# Patient Record
Sex: Male | Born: 1945
Health system: Southern US, Community
[De-identification: ages and names within clinical notes are randomized; demographics above are authoritative.]

## PROBLEM LIST (undated history)

## (undated) DIAGNOSIS — E871 Hypo-osmolality and hyponatremia: Secondary | ICD-10-CM

## (undated) DIAGNOSIS — M25559 Pain in unspecified hip: Secondary | ICD-10-CM

## (undated) DIAGNOSIS — M25579 Pain in unspecified ankle and joints of unspecified foot: Secondary | ICD-10-CM

## (undated) DIAGNOSIS — F329 Major depressive disorder, single episode, unspecified: Secondary | ICD-10-CM

## (undated) DIAGNOSIS — R600 Localized edema: Secondary | ICD-10-CM

## (undated) DIAGNOSIS — R590 Localized enlarged lymph nodes: Secondary | ICD-10-CM

## (undated) DIAGNOSIS — Z87898 Personal history of other specified conditions: Secondary | ICD-10-CM

## (undated) DIAGNOSIS — K859 Acute pancreatitis without necrosis or infection, unspecified: Secondary | ICD-10-CM

## (undated) DIAGNOSIS — E1139 Type 2 diabetes mellitus with other diabetic ophthalmic complication: Secondary | ICD-10-CM

## (undated) DIAGNOSIS — I10 Essential (primary) hypertension: Secondary | ICD-10-CM

## (undated) DIAGNOSIS — J449 Chronic obstructive pulmonary disease, unspecified: Secondary | ICD-10-CM

## (undated) DIAGNOSIS — K219 Gastro-esophageal reflux disease without esophagitis: Secondary | ICD-10-CM

## (undated) DIAGNOSIS — G510 Bell's palsy: Secondary | ICD-10-CM

## (undated) DIAGNOSIS — Z8659 Personal history of other mental and behavioral disorders: Secondary | ICD-10-CM

## (undated) DIAGNOSIS — R197 Diarrhea, unspecified: Secondary | ICD-10-CM

## (undated) DIAGNOSIS — L570 Actinic keratosis: Secondary | ICD-10-CM

## (undated) DIAGNOSIS — E785 Hyperlipidemia, unspecified: Secondary | ICD-10-CM

## (undated) DIAGNOSIS — R634 Abnormal weight loss: Secondary | ICD-10-CM

## (undated) DIAGNOSIS — B20 Human immunodeficiency virus [HIV] disease: Secondary | ICD-10-CM

## (undated) DIAGNOSIS — Z87442 Personal history of urinary calculi: Secondary | ICD-10-CM

## (undated) DIAGNOSIS — L309 Dermatitis, unspecified: Secondary | ICD-10-CM

## (undated) DIAGNOSIS — I2510 Atherosclerotic heart disease of native coronary artery without angina pectoris: Secondary | ICD-10-CM

## (undated) DIAGNOSIS — R351 Nocturia: Secondary | ICD-10-CM

## (undated) DIAGNOSIS — IMO0002 Reserved for concepts with insufficient information to code with codable children: Secondary | ICD-10-CM

## (undated) DIAGNOSIS — Z9861 Coronary angioplasty status: Secondary | ICD-10-CM

## (undated) DIAGNOSIS — Z951 Presence of aortocoronary bypass graft: Secondary | ICD-10-CM

## (undated) DIAGNOSIS — I213 ST elevation (STEMI) myocardial infarction of unspecified site: Secondary | ICD-10-CM

## (undated) DIAGNOSIS — R599 Enlarged lymph nodes, unspecified: Secondary | ICD-10-CM

## (undated) DIAGNOSIS — R0989 Other specified symptoms and signs involving the circulatory and respiratory systems: Secondary | ICD-10-CM

## (undated) DIAGNOSIS — N649 Disorder of breast, unspecified: Secondary | ICD-10-CM

## (undated) DIAGNOSIS — E875 Hyperkalemia: Secondary | ICD-10-CM

## (undated) DIAGNOSIS — N183 Chronic kidney disease, stage 3 (moderate): Secondary | ICD-10-CM

## (undated) DIAGNOSIS — R413 Other amnesia: Secondary | ICD-10-CM

## (undated) DIAGNOSIS — D649 Anemia, unspecified: Secondary | ICD-10-CM

## (undated) DIAGNOSIS — R188 Other ascites: Secondary | ICD-10-CM

## (undated) DIAGNOSIS — N529 Male erectile dysfunction, unspecified: Secondary | ICD-10-CM

## (undated) DIAGNOSIS — A6000 Herpesviral infection of urogenital system, unspecified: Secondary | ICD-10-CM

## (undated) DIAGNOSIS — E881 Lipodystrophy, not elsewhere classified: Secondary | ICD-10-CM

## (undated) DIAGNOSIS — I350 Nonrheumatic aortic (valve) stenosis: Secondary | ICD-10-CM

## (undated) DIAGNOSIS — K5900 Constipation, unspecified: Secondary | ICD-10-CM

## (undated) DIAGNOSIS — I214 Non-ST elevation (NSTEMI) myocardial infarction: Secondary | ICD-10-CM

## (undated) DIAGNOSIS — M1990 Unspecified osteoarthritis, unspecified site: Secondary | ICD-10-CM

## (undated) DIAGNOSIS — N189 Chronic kidney disease, unspecified: Secondary | ICD-10-CM

## (undated) DIAGNOSIS — K921 Melena: Secondary | ICD-10-CM

## (undated) DIAGNOSIS — I200 Unstable angina: Secondary | ICD-10-CM

## (undated) DIAGNOSIS — H905 Unspecified sensorineural hearing loss: Secondary | ICD-10-CM

## (undated) DIAGNOSIS — M25569 Pain in unspecified knee: Secondary | ICD-10-CM

## (undated) DIAGNOSIS — M21969 Unspecified acquired deformity of unspecified lower leg: Secondary | ICD-10-CM

## (undated) DIAGNOSIS — H04129 Dry eye syndrome of unspecified lacrimal gland: Secondary | ICD-10-CM

## (undated) DIAGNOSIS — I739 Peripheral vascular disease, unspecified: Secondary | ICD-10-CM

## (undated) DIAGNOSIS — R61 Generalized hyperhidrosis: Secondary | ICD-10-CM

## (undated) DIAGNOSIS — R079 Chest pain, unspecified: Secondary | ICD-10-CM

## (undated) DIAGNOSIS — J340 Abscess, furuncle and carbuncle of nose: Secondary | ICD-10-CM

## (undated) DIAGNOSIS — E43 Unspecified severe protein-calorie malnutrition: Secondary | ICD-10-CM

## (undated) HISTORY — DX: Nocturia: R35.1

## (undated) HISTORY — DX: Abnormal weight loss: R63.4

## (undated) HISTORY — DX: Nonrheumatic aortic (valve) stenosis: I35.0

## (undated) HISTORY — DX: Unspecified acquired deformity of unspecified lower leg: M21.969

## (undated) HISTORY — DX: Generalized hyperhidrosis: R61

## (undated) HISTORY — DX: Non-ST elevation (NSTEMI) myocardial infarction: I21.4

## (undated) HISTORY — DX: Unspecified sensorineural hearing loss: H90.5

## (undated) HISTORY — DX: Dermatitis, unspecified: L30.9

## (undated) HISTORY — DX: Chronic kidney disease, unspecified: N18.9

## (undated) HISTORY — DX: Gastro-esophageal reflux disease without esophagitis: K21.9

## (undated) HISTORY — DX: Localized enlarged lymph nodes: R59.0

## (undated) HISTORY — DX: Atherosclerotic heart disease of native coronary artery without angina pectoris: I25.10

## (undated) HISTORY — DX: Unstable angina: I20.0

## (undated) HISTORY — DX: Hypo-osmolality and hyponatremia: E87.1

## (undated) HISTORY — DX: Actinic keratosis: L57.0

## (undated) HISTORY — DX: Disorder of breast, unspecified: N64.9

## (undated) HISTORY — DX: Dry eye syndrome of unspecified lacrimal gland: H04.129

## (undated) HISTORY — DX: ST elevation (STEMI) myocardial infarction of unspecified site: I21.3

## (undated) HISTORY — DX: Localized edema: R60.0

## (undated) HISTORY — PX: VASECTOMY: SHX75

## (undated) HISTORY — DX: Chronic obstructive pulmonary disease, unspecified: J44.9

## (undated) HISTORY — DX: Coronary angioplasty status: Z98.61

## (undated) HISTORY — DX: Other ascites: R18.8

## (undated) HISTORY — DX: Unspecified severe protein-calorie malnutrition: E43

## (undated) HISTORY — DX: Diarrhea, unspecified: R19.7

## (undated) HISTORY — DX: Pain in unspecified knee: M25.569

## (undated) HISTORY — DX: Personal history of other specified conditions: Z87.898

## (undated) HISTORY — DX: Hyperlipidemia, unspecified: E78.5

## (undated) HISTORY — DX: Chest pain, unspecified: R07.9

## (undated) HISTORY — DX: Lipodystrophy, not elsewhere classified: E88.1

## (undated) HISTORY — DX: Pain in unspecified hip: M25.559

## (undated) HISTORY — DX: Other amnesia: R41.3

## (undated) HISTORY — DX: Presence of aortocoronary bypass graft: Z95.1

## (undated) HISTORY — DX: Reserved for concepts with insufficient information to code with codable children: IMO0002

## (undated) HISTORY — DX: Bell's palsy: G51.0

## (undated) HISTORY — DX: Human immunodeficiency virus (HIV) disease: B20

## (undated) HISTORY — DX: Other specified symptoms and signs involving the circulatory and respiratory systems: R09.89

## (undated) HISTORY — DX: Personal history of other mental and behavioral disorders: Z86.59

## (undated) HISTORY — DX: Melena: K92.1

## (undated) HISTORY — DX: Personal history of urinary calculi: Z87.442

## (undated) HISTORY — DX: Abscess, furuncle and carbuncle of nose: J34.0

## (undated) HISTORY — DX: Unspecified osteoarthritis, unspecified site: M19.90

## (undated) HISTORY — DX: Type 2 diabetes mellitus with other diabetic ophthalmic complication: E11.39

## (undated) HISTORY — DX: Herpesviral infection of urogenital system, unspecified: A60.00

## (undated) HISTORY — DX: Pain in unspecified ankle and joints of unspecified foot: M25.579

## (undated) HISTORY — DX: Major depressive disorder, single episode, unspecified: F32.9

## (undated) HISTORY — DX: Constipation, unspecified: K59.00

## (undated) HISTORY — DX: Essential (primary) hypertension: I10

## (undated) HISTORY — DX: Enlarged lymph nodes, unspecified: R59.9

## (undated) HISTORY — DX: Peripheral vascular disease, unspecified: I73.9

## (undated) HISTORY — DX: Anemia, unspecified: D64.9

## (undated) HISTORY — DX: Hyperkalemia: E87.5

## (undated) HISTORY — DX: Chronic kidney disease, stage 3 (moderate): N18.3

## (undated) HISTORY — DX: Male erectile dysfunction, unspecified: N52.9

---

## 1989-08-04 DIAGNOSIS — Z21 Asymptomatic human immunodeficiency virus [HIV] infection status: Secondary | ICD-10-CM

## 1989-08-04 DIAGNOSIS — B20 Human immunodeficiency virus [HIV] disease: Secondary | ICD-10-CM

## 1989-08-04 HISTORY — DX: Human immunodeficiency virus (HIV) disease: B20

## 1989-08-04 HISTORY — DX: Asymptomatic human immunodeficiency virus (hiv) infection status: Z21

## 1991-07-11 ENCOUNTER — Encounter (INDEPENDENT_AMBULATORY_CARE_PROVIDER_SITE_OTHER): Payer: Self-pay | Admitting: *Deleted

## 1991-07-11 LAB — CONVERTED CEMR LAB
CD4 Count: 360 microliters
CD4 T Cell Abs: 360

## 1997-11-28 ENCOUNTER — Encounter: Admission: RE | Admit: 1997-11-28 | Discharge: 1997-11-28 | Payer: Self-pay | Admitting: Internal Medicine

## 1997-12-20 ENCOUNTER — Encounter: Admission: RE | Admit: 1997-12-20 | Discharge: 1997-12-20 | Payer: Self-pay | Admitting: Internal Medicine

## 1998-01-15 ENCOUNTER — Encounter: Admission: RE | Admit: 1998-01-15 | Discharge: 1998-01-15 | Payer: Self-pay | Admitting: Internal Medicine

## 1998-02-09 ENCOUNTER — Encounter: Admission: RE | Admit: 1998-02-09 | Discharge: 1998-02-09 | Payer: Self-pay | Admitting: Internal Medicine

## 1998-03-06 ENCOUNTER — Encounter: Admission: RE | Admit: 1998-03-06 | Discharge: 1998-03-06 | Payer: Self-pay | Admitting: Infectious Diseases

## 1998-03-27 ENCOUNTER — Encounter: Admission: RE | Admit: 1998-03-27 | Discharge: 1998-03-27 | Payer: Self-pay | Admitting: Obstetrics and Gynecology

## 1998-05-01 ENCOUNTER — Encounter: Admission: RE | Admit: 1998-05-01 | Discharge: 1998-05-01 | Payer: Self-pay | Admitting: Internal Medicine

## 1998-05-30 ENCOUNTER — Ambulatory Visit (HOSPITAL_COMMUNITY): Admission: RE | Admit: 1998-05-30 | Discharge: 1998-05-30 | Payer: Self-pay | Admitting: Infectious Diseases

## 1998-06-25 ENCOUNTER — Encounter: Admission: RE | Admit: 1998-06-25 | Discharge: 1998-06-25 | Payer: Self-pay | Admitting: Infectious Diseases

## 1998-07-03 ENCOUNTER — Encounter: Admission: RE | Admit: 1998-07-03 | Discharge: 1998-07-03 | Payer: Self-pay | Admitting: Internal Medicine

## 1998-08-22 ENCOUNTER — Encounter: Admission: RE | Admit: 1998-08-22 | Discharge: 1998-08-22 | Payer: Self-pay | Admitting: Internal Medicine

## 1998-09-18 ENCOUNTER — Encounter: Admission: RE | Admit: 1998-09-18 | Discharge: 1998-09-18 | Payer: Self-pay | Admitting: Internal Medicine

## 1998-10-08 ENCOUNTER — Encounter: Admission: RE | Admit: 1998-10-08 | Discharge: 1998-10-08 | Payer: Self-pay | Admitting: Infectious Diseases

## 1998-12-17 ENCOUNTER — Encounter: Admission: RE | Admit: 1998-12-17 | Discharge: 1998-12-17 | Payer: Self-pay | Admitting: Internal Medicine

## 1999-02-19 ENCOUNTER — Encounter: Admission: RE | Admit: 1999-02-19 | Discharge: 1999-02-19 | Payer: Self-pay | Admitting: Infectious Diseases

## 1999-03-25 ENCOUNTER — Encounter: Admission: RE | Admit: 1999-03-25 | Discharge: 1999-03-25 | Payer: Self-pay | Admitting: Internal Medicine

## 1999-06-10 ENCOUNTER — Encounter: Admission: RE | Admit: 1999-06-10 | Discharge: 1999-06-10 | Payer: Self-pay | Admitting: Internal Medicine

## 1999-09-11 ENCOUNTER — Encounter (INDEPENDENT_AMBULATORY_CARE_PROVIDER_SITE_OTHER): Payer: Self-pay | Admitting: *Deleted

## 1999-09-11 ENCOUNTER — Emergency Department (HOSPITAL_COMMUNITY): Admission: EM | Admit: 1999-09-11 | Discharge: 1999-09-11 | Payer: Self-pay | Admitting: Emergency Medicine

## 1999-09-23 ENCOUNTER — Encounter: Admission: RE | Admit: 1999-09-23 | Discharge: 1999-09-23 | Payer: Self-pay | Admitting: Internal Medicine

## 1999-10-08 ENCOUNTER — Encounter: Admission: RE | Admit: 1999-10-08 | Discharge: 1999-10-08 | Payer: Self-pay | Admitting: Internal Medicine

## 1999-10-21 ENCOUNTER — Encounter: Admission: RE | Admit: 1999-10-21 | Discharge: 1999-10-21 | Payer: Self-pay | Admitting: Internal Medicine

## 2000-02-10 ENCOUNTER — Encounter: Admission: RE | Admit: 2000-02-10 | Discharge: 2000-02-10 | Payer: Self-pay | Admitting: Internal Medicine

## 2000-03-03 ENCOUNTER — Encounter: Admission: RE | Admit: 2000-03-03 | Discharge: 2000-03-03 | Payer: Self-pay | Admitting: Internal Medicine

## 2000-06-08 ENCOUNTER — Encounter: Admission: RE | Admit: 2000-06-08 | Discharge: 2000-06-08 | Payer: Self-pay | Admitting: Internal Medicine

## 2000-09-28 ENCOUNTER — Encounter: Admission: RE | Admit: 2000-09-28 | Discharge: 2000-09-28 | Payer: Self-pay | Admitting: Internal Medicine

## 2000-10-20 ENCOUNTER — Encounter: Admission: RE | Admit: 2000-10-20 | Discharge: 2000-10-20 | Payer: Self-pay | Admitting: Internal Medicine

## 2001-01-11 ENCOUNTER — Encounter: Admission: RE | Admit: 2001-01-11 | Discharge: 2001-01-11 | Payer: Self-pay | Admitting: Infectious Diseases

## 2001-03-02 ENCOUNTER — Encounter: Admission: RE | Admit: 2001-03-02 | Discharge: 2001-03-02 | Payer: Self-pay | Admitting: Internal Medicine

## 2001-05-03 ENCOUNTER — Encounter: Admission: RE | Admit: 2001-05-03 | Discharge: 2001-05-03 | Payer: Self-pay | Admitting: Internal Medicine

## 2001-05-24 ENCOUNTER — Encounter: Admission: RE | Admit: 2001-05-24 | Discharge: 2001-05-24 | Payer: Self-pay | Admitting: Internal Medicine

## 2001-08-30 ENCOUNTER — Encounter: Admission: RE | Admit: 2001-08-30 | Discharge: 2001-08-30 | Payer: Self-pay | Admitting: Internal Medicine

## 2001-12-20 ENCOUNTER — Encounter: Admission: RE | Admit: 2001-12-20 | Discharge: 2001-12-20 | Payer: Self-pay | Admitting: Internal Medicine

## 2001-12-31 ENCOUNTER — Encounter: Admission: RE | Admit: 2001-12-31 | Discharge: 2001-12-31 | Payer: Self-pay | Admitting: Otolaryngology

## 2001-12-31 ENCOUNTER — Encounter: Payer: Self-pay | Admitting: Otolaryngology

## 2002-04-11 ENCOUNTER — Encounter: Admission: RE | Admit: 2002-04-11 | Discharge: 2002-04-11 | Payer: Self-pay | Admitting: Internal Medicine

## 2002-05-04 HISTORY — PX: CORONARY ARTERY BYPASS GRAFT: SHX141

## 2002-05-26 ENCOUNTER — Encounter: Payer: Self-pay | Admitting: Cardiology

## 2002-05-27 ENCOUNTER — Encounter: Payer: Self-pay | Admitting: Thoracic Surgery (Cardiothoracic Vascular Surgery)

## 2002-05-27 ENCOUNTER — Inpatient Hospital Stay (HOSPITAL_COMMUNITY): Admission: AD | Admit: 2002-05-27 | Discharge: 2002-05-31 | Payer: Self-pay | Admitting: Cardiology

## 2002-05-28 ENCOUNTER — Encounter: Payer: Self-pay | Admitting: Thoracic Surgery (Cardiothoracic Vascular Surgery)

## 2002-05-29 ENCOUNTER — Encounter: Payer: Self-pay | Admitting: Cardiothoracic Surgery

## 2002-05-30 ENCOUNTER — Encounter: Payer: Self-pay | Admitting: Cardiothoracic Surgery

## 2002-05-31 ENCOUNTER — Encounter: Payer: Self-pay | Admitting: Thoracic Surgery (Cardiothoracic Vascular Surgery)

## 2002-06-16 ENCOUNTER — Encounter: Admission: RE | Admit: 2002-06-16 | Discharge: 2002-06-16 | Payer: Self-pay | Admitting: Internal Medicine

## 2002-08-01 ENCOUNTER — Encounter (HOSPITAL_COMMUNITY): Admission: RE | Admit: 2002-08-01 | Discharge: 2002-10-30 | Payer: Self-pay | Admitting: Cardiology

## 2002-08-01 ENCOUNTER — Encounter: Admission: RE | Admit: 2002-08-01 | Discharge: 2002-08-01 | Payer: Self-pay | Admitting: Internal Medicine

## 2002-09-02 ENCOUNTER — Encounter: Admission: RE | Admit: 2002-09-02 | Discharge: 2002-09-02 | Payer: Self-pay | Admitting: Internal Medicine

## 2002-11-21 ENCOUNTER — Encounter: Admission: RE | Admit: 2002-11-21 | Discharge: 2002-11-21 | Payer: Self-pay | Admitting: Internal Medicine

## 2003-03-13 ENCOUNTER — Encounter: Admission: RE | Admit: 2003-03-13 | Discharge: 2003-03-13 | Payer: Self-pay | Admitting: Internal Medicine

## 2003-03-28 ENCOUNTER — Encounter: Admission: RE | Admit: 2003-03-28 | Discharge: 2003-03-28 | Payer: Self-pay | Admitting: Internal Medicine

## 2003-03-29 ENCOUNTER — Encounter: Admission: RE | Admit: 2003-03-29 | Discharge: 2003-03-29 | Payer: Self-pay | Admitting: Internal Medicine

## 2003-05-12 ENCOUNTER — Encounter: Admission: RE | Admit: 2003-05-12 | Discharge: 2003-05-12 | Payer: Self-pay | Admitting: Internal Medicine

## 2003-06-06 ENCOUNTER — Encounter: Admission: RE | Admit: 2003-06-06 | Discharge: 2003-06-06 | Payer: Self-pay | Admitting: Internal Medicine

## 2003-07-03 ENCOUNTER — Encounter: Admission: RE | Admit: 2003-07-03 | Discharge: 2003-07-03 | Payer: Self-pay | Admitting: Internal Medicine

## 2003-08-17 ENCOUNTER — Encounter: Admission: RE | Admit: 2003-08-17 | Discharge: 2003-08-17 | Payer: Self-pay | Admitting: Internal Medicine

## 2003-10-24 ENCOUNTER — Encounter: Admission: RE | Admit: 2003-10-24 | Discharge: 2003-10-24 | Payer: Self-pay | Admitting: Internal Medicine

## 2004-01-04 ENCOUNTER — Encounter: Admission: RE | Admit: 2004-01-04 | Discharge: 2004-01-04 | Payer: Self-pay | Admitting: Infectious Diseases

## 2004-02-12 ENCOUNTER — Encounter: Admission: RE | Admit: 2004-02-12 | Discharge: 2004-02-12 | Payer: Self-pay | Admitting: Internal Medicine

## 2005-02-20 ENCOUNTER — Ambulatory Visit (HOSPITAL_COMMUNITY): Admission: RE | Admit: 2005-02-20 | Discharge: 2005-02-20 | Payer: Self-pay | Admitting: Internal Medicine

## 2005-02-20 ENCOUNTER — Ambulatory Visit: Payer: Self-pay | Admitting: Internal Medicine

## 2005-03-10 ENCOUNTER — Ambulatory Visit: Payer: Self-pay | Admitting: Internal Medicine

## 2005-06-03 ENCOUNTER — Ambulatory Visit: Payer: Self-pay | Admitting: Internal Medicine

## 2005-09-09 ENCOUNTER — Ambulatory Visit: Payer: Self-pay | Admitting: Internal Medicine

## 2005-09-23 ENCOUNTER — Ambulatory Visit: Payer: Self-pay | Admitting: Internal Medicine

## 2005-11-24 ENCOUNTER — Emergency Department (HOSPITAL_COMMUNITY): Admission: EM | Admit: 2005-11-24 | Discharge: 2005-11-24 | Payer: Self-pay | Admitting: Emergency Medicine

## 2005-12-08 ENCOUNTER — Encounter: Admission: RE | Admit: 2005-12-08 | Discharge: 2005-12-08 | Payer: Self-pay | Admitting: Internal Medicine

## 2005-12-08 ENCOUNTER — Ambulatory Visit: Payer: Self-pay | Admitting: Internal Medicine

## 2005-12-23 ENCOUNTER — Ambulatory Visit: Payer: Self-pay | Admitting: Internal Medicine

## 2006-01-08 ENCOUNTER — Emergency Department (HOSPITAL_COMMUNITY): Admission: EM | Admit: 2006-01-08 | Discharge: 2006-01-08 | Payer: Self-pay | Admitting: Family Medicine

## 2006-02-17 ENCOUNTER — Ambulatory Visit: Payer: Self-pay | Admitting: Internal Medicine

## 2006-02-17 ENCOUNTER — Encounter (INDEPENDENT_AMBULATORY_CARE_PROVIDER_SITE_OTHER): Payer: Self-pay | Admitting: *Deleted

## 2006-02-17 LAB — CONVERTED CEMR LAB
CD4 Count: 599 microliters
HIV 1 RNA Quant: 49 copies/mL

## 2006-03-09 ENCOUNTER — Encounter: Admission: RE | Admit: 2006-03-09 | Discharge: 2006-03-09 | Payer: Self-pay | Admitting: Internal Medicine

## 2006-03-09 ENCOUNTER — Ambulatory Visit: Payer: Self-pay | Admitting: Internal Medicine

## 2006-03-09 ENCOUNTER — Encounter (INDEPENDENT_AMBULATORY_CARE_PROVIDER_SITE_OTHER): Payer: Self-pay | Admitting: *Deleted

## 2006-03-09 LAB — CONVERTED CEMR LAB
CD4 Count: 540 microliters
HIV 1 RNA Quant: 107 copies/mL

## 2006-03-24 ENCOUNTER — Ambulatory Visit: Payer: Self-pay | Admitting: Internal Medicine

## 2006-05-12 ENCOUNTER — Ambulatory Visit: Payer: Self-pay | Admitting: Internal Medicine

## 2006-05-12 DIAGNOSIS — I10 Essential (primary) hypertension: Secondary | ICD-10-CM | POA: Insufficient documentation

## 2006-05-12 DIAGNOSIS — Z9861 Coronary angioplasty status: Secondary | ICD-10-CM | POA: Insufficient documentation

## 2006-05-12 DIAGNOSIS — I2510 Atherosclerotic heart disease of native coronary artery without angina pectoris: Secondary | ICD-10-CM

## 2006-05-12 DIAGNOSIS — E785 Hyperlipidemia, unspecified: Secondary | ICD-10-CM | POA: Insufficient documentation

## 2006-05-12 DIAGNOSIS — H905 Unspecified sensorineural hearing loss: Secondary | ICD-10-CM

## 2006-05-12 DIAGNOSIS — B20 Human immunodeficiency virus [HIV] disease: Secondary | ICD-10-CM | POA: Insufficient documentation

## 2006-05-12 HISTORY — DX: Atherosclerotic heart disease of native coronary artery without angina pectoris: I25.10

## 2006-05-12 HISTORY — DX: Human immunodeficiency virus (HIV) disease: B20

## 2006-05-12 HISTORY — DX: Essential (primary) hypertension: I10

## 2006-05-12 HISTORY — DX: Atherosclerotic heart disease of native coronary artery without angina pectoris: Z98.61

## 2006-05-12 HISTORY — DX: Unspecified sensorineural hearing loss: H90.5

## 2006-06-12 ENCOUNTER — Ambulatory Visit: Payer: Self-pay | Admitting: Internal Medicine

## 2006-06-12 LAB — CONVERTED CEMR LAB
ALT: 25 units/L (ref 0–53)
Basophils Absolute: 0 10*3/uL (ref 0.0–0.1)
CO2: 25 meq/L (ref 19–32)
Calcium: 8.7 mg/dL (ref 8.4–10.5)
Chloride: 106 meq/L (ref 96–112)
Cholesterol: 127 mg/dL (ref 0–200)
Creatinine, Ser: 0.85 mg/dL (ref 0.40–1.50)
Glucose, Bld: 103 mg/dL — ABNORMAL HIGH (ref 70–99)
Hemoglobin: 14 g/dL (ref 13.9–16.8)
Leukocyte count, blood: 4.9 10*9/L (ref 3.7–10.0)
Lymphocytes Relative: 27 % (ref 15–43)
Lymphs Abs: 1.3 10*3/uL (ref 0.8–3.1)
Monocytes Absolute: 0.5 10*3/uL (ref 0.2–0.7)
Neutro Abs: 2.8 10*3/uL (ref 1.8–6.8)
RBC: 3.32 M/uL — ABNORMAL LOW (ref 4.20–5.50)
RDW: 14.7 % (ref 11.5–15.3)
Total CHOL/HDL Ratio: 4.1
Total Protein: 6.7 g/dL (ref 6.0–8.3)

## 2006-07-23 ENCOUNTER — Ambulatory Visit: Payer: Self-pay | Admitting: Internal Medicine

## 2006-07-23 ENCOUNTER — Encounter: Admission: RE | Admit: 2006-07-23 | Discharge: 2006-07-23 | Payer: Self-pay | Admitting: Internal Medicine

## 2006-07-23 ENCOUNTER — Encounter (INDEPENDENT_AMBULATORY_CARE_PROVIDER_SITE_OTHER): Payer: Self-pay | Admitting: *Deleted

## 2006-07-23 LAB — CONVERTED CEMR LAB
ALT: 33 units/L (ref 0–53)
AST: 24 units/L (ref 0–37)
Basophils Absolute: 0 10*3/uL (ref 0.0–0.1)
Basophils Relative: 0 % (ref 0–1)
Creatinine, Ser: 0.8 mg/dL (ref 0.40–1.50)
HIV 1 RNA Quant: <50 copies/mL (ref <50)
Hemoglobin: 14.4 g/dL (ref 13.9–16.8)
MCHC: 34.9 g/dL (ref 33.1–35.4)
MCV: 120.1 fL — ABNORMAL HIGH (ref 78.8–100.0)
Monocytes Absolute: 0.5 10*3/uL (ref 0.2–0.7)
Monocytes Relative: 11 % (ref 3–11)
RBC: 3.44 M/uL — ABNORMAL LOW (ref 4.20–5.50)
Total Bilirubin: 0.5 mg/dL (ref 0.3–1.2)

## 2006-09-03 DIAGNOSIS — F339 Major depressive disorder, recurrent, unspecified: Secondary | ICD-10-CM

## 2006-09-03 DIAGNOSIS — E1139 Type 2 diabetes mellitus with other diabetic ophthalmic complication: Secondary | ICD-10-CM

## 2006-09-03 DIAGNOSIS — F3289 Other specified depressive episodes: Secondary | ICD-10-CM

## 2006-09-03 DIAGNOSIS — F329 Major depressive disorder, single episode, unspecified: Secondary | ICD-10-CM

## 2006-09-03 DIAGNOSIS — K219 Gastro-esophageal reflux disease without esophagitis: Secondary | ICD-10-CM

## 2006-09-03 DIAGNOSIS — N1832 Chronic kidney disease, stage 3b: Secondary | ICD-10-CM | POA: Insufficient documentation

## 2006-09-03 DIAGNOSIS — Z951 Presence of aortocoronary bypass graft: Secondary | ICD-10-CM

## 2006-09-03 HISTORY — DX: Presence of aortocoronary bypass graft: Z95.1

## 2006-09-03 HISTORY — DX: Other specified depressive episodes: F32.89

## 2006-09-03 HISTORY — DX: Major depressive disorder, single episode, unspecified: F32.9

## 2006-09-03 HISTORY — DX: Type 2 diabetes mellitus with other diabetic ophthalmic complication: E11.39

## 2006-09-03 HISTORY — DX: Gastro-esophageal reflux disease without esophagitis: K21.9

## 2006-09-21 ENCOUNTER — Ambulatory Visit: Payer: Self-pay | Admitting: Internal Medicine

## 2006-09-28 ENCOUNTER — Encounter (INDEPENDENT_AMBULATORY_CARE_PROVIDER_SITE_OTHER): Payer: Self-pay | Admitting: *Deleted

## 2006-09-28 LAB — CONVERTED CEMR LAB

## 2006-09-29 ENCOUNTER — Ambulatory Visit: Payer: Self-pay | Admitting: Internal Medicine

## 2006-09-29 LAB — CONVERTED CEMR LAB: CD4 Count: 530 microliters

## 2006-09-30 ENCOUNTER — Encounter: Payer: Self-pay | Admitting: Internal Medicine

## 2006-10-11 ENCOUNTER — Encounter (INDEPENDENT_AMBULATORY_CARE_PROVIDER_SITE_OTHER): Payer: Self-pay | Admitting: *Deleted

## 2006-11-17 ENCOUNTER — Encounter: Payer: Self-pay | Admitting: Internal Medicine

## 2007-01-05 ENCOUNTER — Ambulatory Visit: Payer: Self-pay | Admitting: Internal Medicine

## 2007-02-15 ENCOUNTER — Ambulatory Visit: Payer: Self-pay | Admitting: Internal Medicine

## 2007-02-15 ENCOUNTER — Encounter: Admission: RE | Admit: 2007-02-15 | Discharge: 2007-02-15 | Payer: Self-pay | Admitting: Internal Medicine

## 2007-02-15 LAB — CONVERTED CEMR LAB
ALT: 30 units/L (ref 0–53)
Albumin: 4.5 g/dL (ref 3.5–5.2)
Alkaline Phosphatase: 71 units/L (ref 39–117)
CO2: 21 meq/L (ref 19–32)
Glucose, Bld: 134 mg/dL — ABNORMAL HIGH (ref 70–99)
HIV 1 RNA Quant: 77 copies/mL — ABNORMAL HIGH (ref <50)
HIV-1 RNA Quant, Log: 1.89 — ABNORMAL HIGH (ref <1.70)
LDL Cholesterol: 17 mg/dL (ref 0–99)
Platelets: 169 10*3/uL (ref 150–400)
Potassium: 3.7 meq/L (ref 3.5–5.3)
RBC: 3.22 M/uL — ABNORMAL LOW (ref 4.22–5.81)
Sodium: 141 meq/L (ref 135–145)
Total Protein: 6.8 g/dL (ref 6.0–8.3)
Triglycerides: 391 mg/dL — ABNORMAL HIGH (ref <150)
VLDL: 78 mg/dL — ABNORMAL HIGH (ref 0–40)
WBC: 4.2 10*3/uL (ref 4.0–10.5)

## 2007-02-16 ENCOUNTER — Encounter: Payer: Self-pay | Admitting: Internal Medicine

## 2007-02-16 LAB — CONVERTED CEMR LAB: CD4 Count: 550 microliters

## 2007-03-02 ENCOUNTER — Ambulatory Visit: Payer: Self-pay | Admitting: Internal Medicine

## 2007-03-09 ENCOUNTER — Telehealth: Payer: Self-pay | Admitting: Internal Medicine

## 2007-03-15 ENCOUNTER — Ambulatory Visit: Payer: Self-pay | Admitting: Gastroenterology

## 2007-03-19 ENCOUNTER — Encounter (INDEPENDENT_AMBULATORY_CARE_PROVIDER_SITE_OTHER): Payer: Self-pay | Admitting: *Deleted

## 2007-03-29 ENCOUNTER — Ambulatory Visit: Payer: Self-pay | Admitting: Gastroenterology

## 2007-04-06 ENCOUNTER — Telehealth: Payer: Self-pay | Admitting: Internal Medicine

## 2007-04-13 ENCOUNTER — Ambulatory Visit: Payer: Self-pay | Admitting: Internal Medicine

## 2007-04-13 LAB — CONVERTED CEMR LAB
BUN: 14 mg/dL (ref 6–23)
Calcium: 8.9 mg/dL (ref 8.4–10.5)
Chloride: 105 meq/L (ref 96–112)
Cholesterol: 125 mg/dL (ref 0–200)
Creatinine, Ser: 0.83 mg/dL (ref 0.40–1.50)
HDL: 27 mg/dL — ABNORMAL LOW (ref >39)
Triglycerides: 419 mg/dL — ABNORMAL HIGH (ref <150)

## 2007-05-04 ENCOUNTER — Ambulatory Visit: Payer: Self-pay | Admitting: Internal Medicine

## 2007-05-04 LAB — CONVERTED CEMR LAB
ALT: 29 units/L (ref 0–53)
Alkaline Phosphatase: 76 units/L (ref 39–117)
Basophils Absolute: 0 10*3/uL (ref 0.0–0.1)
Basophils Relative: 1 % (ref 0–1)
CO2: 22 meq/L (ref 19–32)
Cholesterol: 140 mg/dL (ref 0–200)
Creatinine, Ser: 0.77 mg/dL (ref 0.40–1.50)
Eosinophils Absolute: 0.2 10*3/uL (ref 0.0–0.7)
Eosinophils Relative: 5 % (ref 0–5)
HCT: 39.8 % (ref 39.0–52.0)
Hemoglobin: 13.8 g/dL (ref 13.0–17.0)
Lymphocytes Relative: 28 % (ref 12–46)
MCHC: 34.7 g/dL (ref 30.0–36.0)
MCV: 121.7 fL — ABNORMAL HIGH (ref 78.0–100.0)
Monocytes Absolute: 0.4 10*3/uL (ref 0.2–0.7)
Platelets: 165 10*3/uL (ref 150–400)
RDW: 15.1 % — ABNORMAL HIGH (ref 11.5–14.0)
Sodium: 141 meq/L (ref 135–145)
Total Bilirubin: 0.5 mg/dL (ref 0.3–1.2)
Total CHOL/HDL Ratio: 4.8
Total Protein: 6.8 g/dL (ref 6.0–8.3)

## 2007-06-01 ENCOUNTER — Ambulatory Visit: Payer: Self-pay | Admitting: Internal Medicine

## 2007-08-02 ENCOUNTER — Encounter (INDEPENDENT_AMBULATORY_CARE_PROVIDER_SITE_OTHER): Payer: Self-pay | Admitting: *Deleted

## 2007-08-03 ENCOUNTER — Encounter: Payer: Self-pay | Admitting: Internal Medicine

## 2007-08-03 ENCOUNTER — Encounter (INDEPENDENT_AMBULATORY_CARE_PROVIDER_SITE_OTHER): Payer: Self-pay | Admitting: *Deleted

## 2007-08-10 ENCOUNTER — Ambulatory Visit: Payer: Self-pay | Admitting: Internal Medicine

## 2007-08-10 ENCOUNTER — Encounter: Admission: RE | Admit: 2007-08-10 | Discharge: 2007-08-10 | Payer: Self-pay | Admitting: Internal Medicine

## 2007-08-10 LAB — CONVERTED CEMR LAB
ALT: 20 U/L
AST: 21 U/L
Albumin: 4.7 g/dL
Alkaline Phosphatase: 66 U/L
BUN: 22 mg/dL
CO2: 21 meq/L
Calcium: 9.4 mg/dL
Chloride: 105 meq/L
Cholesterol: 135 mg/dL
Creatinine, Ser: 0.87 mg/dL
Glucose, Bld: 154 mg/dL — ABNORMAL HIGH
HCT: 37 % — ABNORMAL LOW
HDL: 27 mg/dL — ABNORMAL LOW
HIV 1 RNA Quant: <50 {copies}/mL
HIV-1 RNA Quant, Log: <1.7
Hemoglobin: 12.9 g/dL — ABNORMAL LOW
LDL Cholesterol: 37 mg/dL
MCHC: 34.9 g/dL
MCV: 120.1 fL — ABNORMAL HIGH
Platelets: 166 K/uL
Potassium: 4.5 meq/L
RBC: 3.08 M/uL — ABNORMAL LOW
RDW: 14.5 %
Sodium: 140 meq/L
Total Bilirubin: 0.6 mg/dL
Total CHOL/HDL Ratio: 5
Total Protein: 7.1 g/dL
Triglycerides: 355 mg/dL — ABNORMAL HIGH
VLDL: 71 mg/dL — ABNORMAL HIGH
WBC: 4.7 10*3/microliter

## 2007-08-17 ENCOUNTER — Ambulatory Visit: Payer: Self-pay | Admitting: Internal Medicine

## 2007-08-25 ENCOUNTER — Ambulatory Visit: Payer: Self-pay | Admitting: Internal Medicine

## 2007-10-22 ENCOUNTER — Telehealth: Payer: Self-pay | Admitting: Internal Medicine

## 2007-10-25 ENCOUNTER — Emergency Department (HOSPITAL_COMMUNITY): Admission: EM | Admit: 2007-10-25 | Discharge: 2007-10-25 | Payer: Self-pay | Admitting: Emergency Medicine

## 2007-11-16 ENCOUNTER — Encounter (INDEPENDENT_AMBULATORY_CARE_PROVIDER_SITE_OTHER): Payer: Self-pay | Admitting: *Deleted

## 2007-12-02 ENCOUNTER — Ambulatory Visit: Payer: Self-pay | Admitting: Internal Medicine

## 2007-12-02 ENCOUNTER — Encounter: Admission: RE | Admit: 2007-12-02 | Discharge: 2007-12-02 | Payer: Self-pay | Admitting: Internal Medicine

## 2007-12-02 LAB — CONVERTED CEMR LAB
AST: 30 units/L (ref 0–37)
Albumin: 4.6 g/dL (ref 3.5–5.2)
Alkaline Phosphatase: 74 units/L (ref 39–117)
BUN: 13 mg/dL (ref 6–23)
Basophils Relative: 0 % (ref 0–1)
Creatinine, Ser: 0.8 mg/dL (ref 0.40–1.50)
Eosinophils Absolute: 0.1 10*3/uL (ref 0.0–0.7)
HDL: 29 mg/dL — ABNORMAL LOW (ref >39)
HIV 1 RNA Quant: <50 copies/mL (ref <50)
LDL Cholesterol: 32 mg/dL (ref 0–99)
MCHC: 34.1 g/dL (ref 30.0–36.0)
MCV: 120 fL — ABNORMAL HIGH (ref 78.0–100.0)
Monocytes Relative: 10 % (ref 3–12)
Neutrophils Relative %: 61 % (ref 43–77)
Potassium: 4.6 meq/L (ref 3.5–5.3)
RBC: 3.15 M/uL — ABNORMAL LOW (ref 4.22–5.81)
Total CHOL/HDL Ratio: 3.9

## 2007-12-14 ENCOUNTER — Ambulatory Visit: Payer: Self-pay | Admitting: Internal Medicine

## 2007-12-16 ENCOUNTER — Ambulatory Visit: Payer: Self-pay | Admitting: Internal Medicine

## 2008-02-22 ENCOUNTER — Ambulatory Visit: Payer: Self-pay | Admitting: Internal Medicine

## 2008-02-22 DIAGNOSIS — Z87442 Personal history of urinary calculi: Secondary | ICD-10-CM | POA: Insufficient documentation

## 2008-02-22 DIAGNOSIS — M7710 Lateral epicondylitis, unspecified elbow: Secondary | ICD-10-CM | POA: Insufficient documentation

## 2008-03-08 ENCOUNTER — Ambulatory Visit: Payer: Self-pay | Admitting: Internal Medicine

## 2008-03-30 ENCOUNTER — Ambulatory Visit: Payer: Self-pay | Admitting: Internal Medicine

## 2008-03-30 LAB — CONVERTED CEMR LAB
AST: 25 units/L (ref 0–37)
Albumin: 4.5 g/dL (ref 3.5–5.2)
Alkaline Phosphatase: 65 units/L (ref 39–117)
BUN: 19 mg/dL (ref 6–23)
Basophils Relative: 0 % (ref 0–1)
Eosinophils Absolute: 0.2 10*3/uL (ref 0.0–0.7)
Eosinophils Relative: 4 % (ref 0–5)
HDL: 29 mg/dL — ABNORMAL LOW (ref >39)
LDL Cholesterol: 35 mg/dL (ref 0–99)
MCHC: 34.1 g/dL (ref 30.0–36.0)
MCV: 121.4 fL — ABNORMAL HIGH (ref 78.0–100.0)
Monocytes Relative: 8 % (ref 3–12)
Neutrophils Relative %: 69 % (ref 43–77)
Platelets: 159 10*3/uL (ref 150–400)
Potassium: 4.1 meq/L (ref 3.5–5.3)
RBC: 2.9 M/uL — ABNORMAL LOW (ref 4.22–5.81)
Total Bilirubin: 0.5 mg/dL (ref 0.3–1.2)
Total CHOL/HDL Ratio: 4.1
VLDL: 54 mg/dL — ABNORMAL HIGH (ref 0–40)

## 2008-04-18 ENCOUNTER — Encounter: Payer: Self-pay | Admitting: Internal Medicine

## 2008-04-18 ENCOUNTER — Ambulatory Visit: Payer: Self-pay | Admitting: Internal Medicine

## 2008-04-18 DIAGNOSIS — K921 Melena: Secondary | ICD-10-CM

## 2008-04-18 HISTORY — DX: Melena: K92.1

## 2008-05-03 ENCOUNTER — Ambulatory Visit: Payer: Self-pay | Admitting: Internal Medicine

## 2008-07-17 ENCOUNTER — Ambulatory Visit: Payer: Self-pay | Admitting: Internal Medicine

## 2008-07-17 DIAGNOSIS — M25559 Pain in unspecified hip: Secondary | ICD-10-CM

## 2008-07-17 DIAGNOSIS — M25569 Pain in unspecified knee: Secondary | ICD-10-CM

## 2008-07-17 HISTORY — DX: Pain in unspecified knee: M25.569

## 2008-07-17 HISTORY — DX: Pain in unspecified hip: M25.559

## 2008-07-18 ENCOUNTER — Encounter: Payer: Self-pay | Admitting: Internal Medicine

## 2008-07-20 ENCOUNTER — Encounter: Payer: Self-pay | Admitting: Internal Medicine

## 2008-07-20 ENCOUNTER — Ambulatory Visit: Payer: Self-pay | Admitting: Internal Medicine

## 2008-07-20 DIAGNOSIS — J449 Chronic obstructive pulmonary disease, unspecified: Secondary | ICD-10-CM

## 2008-07-20 DIAGNOSIS — I739 Peripheral vascular disease, unspecified: Secondary | ICD-10-CM

## 2008-07-20 DIAGNOSIS — J4489 Other specified chronic obstructive pulmonary disease: Secondary | ICD-10-CM

## 2008-07-20 HISTORY — DX: Other specified chronic obstructive pulmonary disease: J44.89

## 2008-07-20 HISTORY — DX: Peripheral vascular disease, unspecified: I73.9

## 2008-07-20 HISTORY — DX: Chronic obstructive pulmonary disease, unspecified: J44.9

## 2008-07-21 ENCOUNTER — Ambulatory Visit: Payer: Self-pay | Admitting: Cardiovascular Disease

## 2008-07-21 LAB — CONVERTED CEMR LAB
Albumin: 3.8 g/dL (ref 3.5–5.2)
Alkaline Phosphatase: 82 units/L (ref 39–117)
BUN: 20 mg/dL (ref 6–23)
Bilirubin Urine: NEGATIVE
Bilirubin, Direct: 0.1 mg/dL (ref 0.0–0.3)
Calcium: 8.5 mg/dL (ref 8.4–10.5)
Eosinophils Absolute: 0.2 10*3/uL (ref 0.0–0.7)
GFR calc Af Amer: 127 mL/min
GFR calc non Af Amer: 105 mL/min
Glucose, Bld: 269 mg/dL — ABNORMAL HIGH (ref 70–99)
H Pylori IgG: NEGATIVE
HCT: 38.3 % — ABNORMAL LOW (ref 39.0–52.0)
Hemoglobin, Urine: NEGATIVE
Hemoglobin: 13.3 g/dL (ref 13.0–17.0)
Ketones, ur: NEGATIVE mg/dL
MCHC: 34.6 g/dL (ref 30.0–36.0)
MCV: 124.2 fL — ABNORMAL HIGH (ref 78.0–100.0)
Monocytes Absolute: 0.6 10*3/uL (ref 0.1–1.0)
Neutro Abs: 4.3 10*3/uL (ref 1.4–7.7)
Platelets: 174 10*3/uL (ref 150–400)
Potassium: 3.8 meq/L (ref 3.5–5.1)
RDW: 13.1 % (ref 11.5–14.6)
Sodium: 140 meq/L (ref 135–145)
Total Protein, Urine: NEGATIVE mg/dL
Total Protein: 6.6 g/dL (ref 6.0–8.3)
Urine Glucose: 250 mg/dL — AB
pH: 6 (ref 5.0–8.0)

## 2008-07-24 ENCOUNTER — Encounter: Payer: Self-pay | Admitting: Internal Medicine

## 2008-08-03 ENCOUNTER — Encounter: Payer: Self-pay | Admitting: Internal Medicine

## 2008-08-03 ENCOUNTER — Ambulatory Visit: Payer: Self-pay

## 2008-08-03 ENCOUNTER — Encounter: Payer: Self-pay | Admitting: Cardiovascular Disease

## 2008-08-07 ENCOUNTER — Ambulatory Visit: Payer: Self-pay | Admitting: Cardiovascular Disease

## 2008-08-22 ENCOUNTER — Ambulatory Visit: Payer: Self-pay | Admitting: Internal Medicine

## 2008-08-22 LAB — CONVERTED CEMR LAB
Albumin: 4.8 g/dL (ref 3.5–5.2)
CO2: 20 meq/L (ref 19–32)
Calcium: 9.3 mg/dL (ref 8.4–10.5)
Cholesterol: 123 mg/dL (ref 0–200)
Creatinine, Urine: 46.1 mg/dL
Glucose, Bld: 198 mg/dL — ABNORMAL HIGH (ref 70–99)
Potassium: 4.2 meq/L (ref 3.5–5.3)
Sodium: 140 meq/L (ref 135–145)
Total Bilirubin: 0.5 mg/dL (ref 0.3–1.2)
Total Protein, Urine: 6
Total Protein: 7.2 g/dL (ref 6.0–8.3)
Triglycerides: 294 mg/dL — ABNORMAL HIGH (ref <150)

## 2008-08-25 ENCOUNTER — Ambulatory Visit: Payer: Self-pay | Admitting: Internal Medicine

## 2008-08-25 LAB — CONVERTED CEMR LAB
AST: 27 units/L (ref 0–37)
Albumin: 4.6 g/dL (ref 3.5–5.2)
Alkaline Phosphatase: 77 units/L (ref 39–117)
BUN: 17 mg/dL (ref 6–23)
Basophils Relative: 0 % (ref 0–1)
Eosinophils Absolute: 0.2 10*3/uL (ref 0.0–0.7)
HDL: 30 mg/dL — ABNORMAL LOW (ref >39)
LDL Cholesterol: 33 mg/dL (ref 0–99)
MCHC: 35.6 g/dL (ref 30.0–36.0)
MCV: 118.9 fL — ABNORMAL HIGH (ref 78.0–100.0)
Neutrophils Relative %: 61 % (ref 43–77)
Platelets: 207 10*3/uL (ref 150–400)
Potassium: 4.2 meq/L (ref 3.5–5.3)
Sodium: 140 meq/L (ref 135–145)
Total Protein: 7.4 g/dL (ref 6.0–8.3)
VLDL: 65 mg/dL — ABNORMAL HIGH (ref 0–40)

## 2008-09-04 ENCOUNTER — Ambulatory Visit: Payer: Self-pay | Admitting: Internal Medicine

## 2008-09-18 ENCOUNTER — Ambulatory Visit: Payer: Self-pay | Admitting: Internal Medicine

## 2008-09-18 DIAGNOSIS — R413 Other amnesia: Secondary | ICD-10-CM

## 2008-09-18 HISTORY — DX: Other amnesia: R41.3

## 2008-09-21 ENCOUNTER — Encounter: Admission: RE | Admit: 2008-09-21 | Discharge: 2008-09-21 | Payer: Self-pay | Admitting: Internal Medicine

## 2008-09-22 ENCOUNTER — Encounter: Payer: Self-pay | Admitting: Internal Medicine

## 2008-11-13 ENCOUNTER — Telehealth (INDEPENDENT_AMBULATORY_CARE_PROVIDER_SITE_OTHER): Payer: Self-pay | Admitting: *Deleted

## 2008-11-14 ENCOUNTER — Encounter (INDEPENDENT_AMBULATORY_CARE_PROVIDER_SITE_OTHER): Payer: Self-pay | Admitting: *Deleted

## 2008-12-01 ENCOUNTER — Encounter (INDEPENDENT_AMBULATORY_CARE_PROVIDER_SITE_OTHER): Payer: Self-pay | Admitting: *Deleted

## 2008-12-05 ENCOUNTER — Ambulatory Visit: Payer: Self-pay | Admitting: Internal Medicine

## 2008-12-12 ENCOUNTER — Ambulatory Visit: Payer: Self-pay | Admitting: Internal Medicine

## 2008-12-20 ENCOUNTER — Encounter: Payer: Self-pay | Admitting: Internal Medicine

## 2008-12-20 ENCOUNTER — Ambulatory Visit: Payer: Self-pay | Admitting: Cardiovascular Disease

## 2008-12-20 ENCOUNTER — Ambulatory Visit (HOSPITAL_COMMUNITY): Admission: RE | Admit: 2008-12-20 | Discharge: 2008-12-20 | Payer: Self-pay | Admitting: Internal Medicine

## 2008-12-21 ENCOUNTER — Ambulatory Visit: Payer: Self-pay | Admitting: Internal Medicine

## 2008-12-21 LAB — CONVERTED CEMR LAB: HIV 1 RNA Quant: 51 copies/mL — ABNORMAL HIGH (ref <48)

## 2008-12-24 ENCOUNTER — Encounter: Payer: Self-pay | Admitting: Internal Medicine

## 2008-12-26 ENCOUNTER — Telehealth: Payer: Self-pay | Admitting: Internal Medicine

## 2008-12-28 ENCOUNTER — Emergency Department (HOSPITAL_COMMUNITY): Admission: EM | Admit: 2008-12-28 | Discharge: 2008-12-28 | Payer: Self-pay | Admitting: Emergency Medicine

## 2009-01-02 ENCOUNTER — Telehealth: Payer: Self-pay | Admitting: Internal Medicine

## 2009-01-03 ENCOUNTER — Encounter: Payer: Self-pay | Admitting: Internal Medicine

## 2009-01-04 ENCOUNTER — Ambulatory Visit: Payer: Self-pay | Admitting: Internal Medicine

## 2009-01-09 ENCOUNTER — Encounter: Payer: Self-pay | Admitting: Internal Medicine

## 2009-01-10 ENCOUNTER — Telehealth: Payer: Self-pay | Admitting: Internal Medicine

## 2009-01-11 ENCOUNTER — Telehealth (INDEPENDENT_AMBULATORY_CARE_PROVIDER_SITE_OTHER): Payer: Self-pay | Admitting: Licensed Clinical Social Worker

## 2009-01-22 ENCOUNTER — Encounter: Payer: Self-pay | Admitting: Internal Medicine

## 2009-02-13 ENCOUNTER — Ambulatory Visit: Payer: Self-pay | Admitting: Internal Medicine

## 2009-02-13 LAB — CONVERTED CEMR LAB
ALT: 23 units/L (ref 0–53)
Albumin: 4.7 g/dL (ref 3.5–5.2)
Basophils Relative: 1 % (ref 0–1)
CO2: 22 meq/L (ref 19–32)
Chloride: 101 meq/L (ref 96–112)
Cholesterol: 189 mg/dL (ref 0–200)
Eosinophils Absolute: 0.2 10*3/uL (ref 0.0–0.7)
Glucose, Bld: 154 mg/dL — ABNORMAL HIGH (ref 70–99)
HDL: 34 mg/dL — ABNORMAL LOW (ref >39)
Lymphs Abs: 1.3 10*3/uL (ref 0.7–4.0)
Monocytes Relative: 14 % — ABNORMAL HIGH (ref 3–12)
Neutro Abs: 2.4 10*3/uL (ref 1.7–7.7)
Neutrophils Relative %: 53 % (ref 43–77)
Platelets: 204 10*3/uL (ref 150–400)
Potassium: 4.5 meq/L (ref 3.5–5.3)
RBC: 3.61 M/uL — ABNORMAL LOW (ref 4.22–5.81)
Sodium: 140 meq/L (ref 135–145)
Total CHOL/HDL Ratio: 5.6
Total Protein: 7.2 g/dL (ref 6.0–8.3)
WBC: 4.6 10*3/uL (ref 4.0–10.5)

## 2009-02-23 ENCOUNTER — Encounter: Payer: Self-pay | Admitting: Internal Medicine

## 2009-03-06 ENCOUNTER — Ambulatory Visit: Payer: Self-pay | Admitting: Internal Medicine

## 2009-03-06 LAB — CONVERTED CEMR LAB
HIV 1 RNA Quant: <48 copies/mL (ref <48)
HIV-1 RNA Quant, Log: <1.68 (ref <1.68)

## 2009-03-21 ENCOUNTER — Telehealth: Payer: Self-pay | Admitting: Internal Medicine

## 2009-03-23 ENCOUNTER — Encounter: Payer: Self-pay | Admitting: Internal Medicine

## 2009-03-23 ENCOUNTER — Telehealth: Payer: Self-pay | Admitting: Internal Medicine

## 2009-03-27 ENCOUNTER — Telehealth (INDEPENDENT_AMBULATORY_CARE_PROVIDER_SITE_OTHER): Payer: Self-pay | Admitting: *Deleted

## 2009-04-03 ENCOUNTER — Ambulatory Visit: Payer: Self-pay | Admitting: Internal Medicine

## 2009-04-03 DIAGNOSIS — R599 Enlarged lymph nodes, unspecified: Secondary | ICD-10-CM

## 2009-04-03 DIAGNOSIS — R634 Abnormal weight loss: Secondary | ICD-10-CM

## 2009-04-03 HISTORY — DX: Abnormal weight loss: R63.4

## 2009-04-03 HISTORY — DX: Enlarged lymph nodes, unspecified: R59.9

## 2009-04-03 LAB — CONVERTED CEMR LAB
Albumin: 4.5 g/dL (ref 3.5–5.2)
BUN: 15 mg/dL (ref 6–23)
Basophils Absolute: 0 10*3/uL (ref 0.0–0.1)
CO2: 24 meq/L (ref 19–32)
Calcium: 9.6 mg/dL (ref 8.4–10.5)
Chloride: 103 meq/L (ref 96–112)
Cholesterol: 141 mg/dL (ref 0–200)
Eosinophils Relative: 2 % (ref 0–5)
Glucose, Bld: 106 mg/dL — ABNORMAL HIGH (ref 70–99)
HCT: 35.1 % — ABNORMAL LOW (ref 39.0–52.0)
HDL: 30 mg/dL — ABNORMAL LOW (ref >39)
Hemoglobin: 12.3 g/dL — ABNORMAL LOW (ref 13.0–17.0)
LDL Cholesterol: 47 mg/dL (ref 0–99)
Lymphocytes Relative: 25 % (ref 12–46)
Monocytes Absolute: 0.5 10*3/uL (ref 0.1–1.0)
Monocytes Relative: 10 % (ref 3–12)
Neutro Abs: 3.2 10*3/uL (ref 1.7–7.7)
Potassium: 4.2 meq/L (ref 3.5–5.3)
RBC: 2.93 M/uL — ABNORMAL LOW (ref 4.22–5.81)
RDW: 15.9 % — ABNORMAL HIGH (ref 11.5–15.5)
Sodium: 142 meq/L (ref 135–145)
Total Protein: 6.9 g/dL (ref 6.0–8.3)
Triglycerides: 318 mg/dL — ABNORMAL HIGH (ref <150)

## 2009-04-10 ENCOUNTER — Telehealth (INDEPENDENT_AMBULATORY_CARE_PROVIDER_SITE_OTHER): Payer: Self-pay | Admitting: *Deleted

## 2009-04-12 ENCOUNTER — Telehealth (INDEPENDENT_AMBULATORY_CARE_PROVIDER_SITE_OTHER): Payer: Self-pay | Admitting: *Deleted

## 2009-04-30 ENCOUNTER — Encounter: Payer: Self-pay | Admitting: Internal Medicine

## 2009-05-03 ENCOUNTER — Ambulatory Visit: Payer: Self-pay | Admitting: Internal Medicine

## 2009-05-03 DIAGNOSIS — Z87898 Personal history of other specified conditions: Secondary | ICD-10-CM | POA: Insufficient documentation

## 2009-05-03 DIAGNOSIS — A6000 Herpesviral infection of urogenital system, unspecified: Secondary | ICD-10-CM

## 2009-05-03 HISTORY — DX: Herpesviral infection of urogenital system, unspecified: A60.00

## 2009-05-03 HISTORY — DX: Personal history of other specified conditions: Z87.898

## 2009-05-04 ENCOUNTER — Encounter: Payer: Self-pay | Admitting: Internal Medicine

## 2009-05-09 ENCOUNTER — Encounter: Payer: Self-pay | Admitting: Internal Medicine

## 2009-05-09 ENCOUNTER — Ambulatory Visit: Payer: Self-pay | Admitting: Psychology

## 2009-05-29 ENCOUNTER — Encounter: Payer: Self-pay | Admitting: Internal Medicine

## 2009-06-12 ENCOUNTER — Ambulatory Visit: Payer: Self-pay | Admitting: Internal Medicine

## 2009-06-12 LAB — CONVERTED CEMR LAB
AST: 40 units/L — ABNORMAL HIGH (ref 0–37)
Albumin: 4.6 g/dL (ref 3.5–5.2)
BUN: 19 mg/dL (ref 6–23)
CD4 Count: 487 microliters
CO2: 27 meq/L (ref 19–32)
Calcium: 8.9 mg/dL (ref 8.4–10.5)
Chloride: 101 meq/L (ref 96–112)
Cholesterol: 127 mg/dL (ref 0–200)
Creatinine, Ser: 0.73 mg/dL (ref 0.40–1.50)
Creatinine, Urine: 20 mg/dL
Glucose, Bld: 129 mg/dL — ABNORMAL HIGH (ref 70–99)
HDL: 32 mg/dL — ABNORMAL LOW (ref >39)
Potassium: 4 meq/L (ref 3.5–5.3)
Total Protein, Urine: 1

## 2009-06-22 ENCOUNTER — Telehealth (INDEPENDENT_AMBULATORY_CARE_PROVIDER_SITE_OTHER): Payer: Self-pay | Admitting: *Deleted

## 2009-07-02 ENCOUNTER — Telehealth: Payer: Self-pay | Admitting: Internal Medicine

## 2009-07-09 ENCOUNTER — Telehealth (INDEPENDENT_AMBULATORY_CARE_PROVIDER_SITE_OTHER): Payer: Self-pay | Admitting: *Deleted

## 2009-08-24 ENCOUNTER — Encounter: Payer: Self-pay | Admitting: Internal Medicine

## 2009-08-24 ENCOUNTER — Telehealth (INDEPENDENT_AMBULATORY_CARE_PROVIDER_SITE_OTHER): Payer: Self-pay | Admitting: *Deleted

## 2009-08-27 ENCOUNTER — Ambulatory Visit: Payer: Self-pay | Admitting: Internal Medicine

## 2009-08-27 LAB — CONVERTED CEMR LAB
HIV 1 RNA Quant: 50 copies/mL — ABNORMAL HIGH (ref <48)
HIV-1 RNA Quant, Log: 1.7 — ABNORMAL HIGH (ref <1.68)

## 2009-09-03 ENCOUNTER — Ambulatory Visit: Payer: Self-pay | Admitting: Internal Medicine

## 2009-09-04 ENCOUNTER — Encounter: Payer: Self-pay | Admitting: Internal Medicine

## 2009-09-12 ENCOUNTER — Ambulatory Visit: Payer: Self-pay | Admitting: Internal Medicine

## 2009-09-19 ENCOUNTER — Encounter: Payer: Self-pay | Admitting: Internal Medicine

## 2009-09-19 ENCOUNTER — Ambulatory Visit: Payer: Self-pay | Admitting: Internal Medicine

## 2009-09-19 LAB — CONVERTED CEMR LAB
AST: 28 units/L (ref 0–37)
Albumin: 4.5 g/dL (ref 3.5–5.2)
Alkaline Phosphatase: 65 units/L (ref 39–117)
HIV 1 RNA Quant: 39 copies/mL
LDL Cholesterol: 35 mg/dL (ref 0–99)
Potassium: 4.5 meq/L (ref 3.5–5.3)
Sodium: 142 meq/L (ref 135–145)
Total Protein: 6.7 g/dL (ref 6.0–8.3)
VLDL: 74 mg/dL — ABNORMAL HIGH (ref 0–40)

## 2009-10-23 ENCOUNTER — Encounter (INDEPENDENT_AMBULATORY_CARE_PROVIDER_SITE_OTHER): Payer: Self-pay | Admitting: *Deleted

## 2009-11-26 ENCOUNTER — Telehealth (INDEPENDENT_AMBULATORY_CARE_PROVIDER_SITE_OTHER): Payer: Self-pay | Admitting: *Deleted

## 2009-11-27 ENCOUNTER — Ambulatory Visit: Payer: Self-pay | Admitting: Internal Medicine

## 2009-11-27 ENCOUNTER — Telehealth (INDEPENDENT_AMBULATORY_CARE_PROVIDER_SITE_OTHER): Payer: Self-pay | Admitting: *Deleted

## 2009-11-27 ENCOUNTER — Encounter (INDEPENDENT_AMBULATORY_CARE_PROVIDER_SITE_OTHER): Payer: Self-pay | Admitting: *Deleted

## 2009-12-03 ENCOUNTER — Ambulatory Visit: Payer: Self-pay | Admitting: Internal Medicine

## 2009-12-03 LAB — CONVERTED CEMR LAB
ALT: 29 units/L (ref 0–53)
AST: 29 units/L (ref 0–37)
Basophils Relative: 0 % (ref 0–1)
CO2: 25 meq/L (ref 19–32)
Cholesterol: 167 mg/dL (ref 0–200)
HIV 1 RNA Quant: <48 copies/mL (ref <48)
HIV-1 RNA Quant, Log: <1.68 (ref <1.68)
Lymphs Abs: 1.4 10*3/uL (ref 0.7–4.0)
MCHC: 35.1 g/dL (ref 30.0–36.0)
Monocytes Relative: 10 % (ref 3–12)
Neutro Abs: 3.4 10*3/uL (ref 1.7–7.7)
Neutrophils Relative %: 61 % (ref 43–77)
Platelets: 167 10*3/uL (ref 150–400)
RBC: 3.48 M/uL — ABNORMAL LOW (ref 4.22–5.81)
Sodium: 140 meq/L (ref 135–145)
Total Bilirubin: 0.4 mg/dL (ref 0.3–1.2)
Total CHOL/HDL Ratio: 5.6
Total Protein: 6.8 g/dL (ref 6.0–8.3)
WBC: 5.5 10*3/uL (ref 4.0–10.5)

## 2009-12-04 ENCOUNTER — Ambulatory Visit: Payer: Self-pay | Admitting: Internal Medicine

## 2009-12-05 ENCOUNTER — Telehealth (INDEPENDENT_AMBULATORY_CARE_PROVIDER_SITE_OTHER): Payer: Self-pay | Admitting: *Deleted

## 2009-12-18 ENCOUNTER — Ambulatory Visit: Payer: Self-pay | Admitting: Internal Medicine

## 2010-01-07 ENCOUNTER — Telehealth (INDEPENDENT_AMBULATORY_CARE_PROVIDER_SITE_OTHER): Payer: Self-pay | Admitting: *Deleted

## 2010-01-11 ENCOUNTER — Ambulatory Visit: Payer: Self-pay | Admitting: Internal Medicine

## 2010-01-11 LAB — CONVERTED CEMR LAB
AST: 27 units/L (ref 0–37)
Albumin: 4.7 g/dL (ref 3.5–5.2)
Alkaline Phosphatase: 57 units/L (ref 39–117)
Basophils Absolute: 0 10*3/uL (ref 0.0–0.1)
Basophils Relative: 1 % (ref 0–1)
Glucose, Bld: 110 mg/dL — ABNORMAL HIGH (ref 70–99)
HDL: 30 mg/dL — ABNORMAL LOW (ref >39)
LDL Cholesterol: 79 mg/dL (ref 0–99)
Lymphocytes Relative: 26 % (ref 12–46)
MCHC: 35.7 g/dL (ref 30.0–36.0)
Neutro Abs: 2.6 10*3/uL (ref 1.7–7.7)
Neutrophils Relative %: 58 % (ref 43–77)
Platelets: 205 10*3/uL (ref 150–400)
Potassium: 3.5 meq/L (ref 3.5–5.3)
RDW: 14.7 % (ref 11.5–15.5)
Sodium: 140 meq/L (ref 135–145)
Total Bilirubin: 0.5 mg/dL (ref 0.3–1.2)
Total Protein: 7 g/dL (ref 6.0–8.3)
Triglycerides: 107 mg/dL (ref <150)
VLDL: 21 mg/dL (ref 0–40)

## 2010-01-22 ENCOUNTER — Ambulatory Visit: Payer: Self-pay | Admitting: Internal Medicine

## 2010-01-22 LAB — CONVERTED CEMR LAB
HDL: 30 mg/dL — ABNORMAL LOW (ref >39)
Hgb A1c MFr Bld: 5.2 %
Triglycerides: 120 mg/dL (ref <150)
VLDL: 24 mg/dL (ref 0–40)

## 2010-02-05 ENCOUNTER — Telehealth (INDEPENDENT_AMBULATORY_CARE_PROVIDER_SITE_OTHER): Payer: Self-pay | Admitting: *Deleted

## 2010-02-07 ENCOUNTER — Ambulatory Visit: Payer: Self-pay | Admitting: Internal Medicine

## 2010-02-13 ENCOUNTER — Telehealth (INDEPENDENT_AMBULATORY_CARE_PROVIDER_SITE_OTHER): Payer: Self-pay | Admitting: *Deleted

## 2010-02-14 ENCOUNTER — Encounter: Payer: Self-pay | Admitting: Internal Medicine

## 2010-02-28 ENCOUNTER — Telehealth (INDEPENDENT_AMBULATORY_CARE_PROVIDER_SITE_OTHER): Payer: Self-pay | Admitting: *Deleted

## 2010-03-04 ENCOUNTER — Telehealth: Payer: Self-pay | Admitting: Internal Medicine

## 2010-04-10 ENCOUNTER — Ambulatory Visit: Payer: Self-pay | Admitting: Internal Medicine

## 2010-04-22 ENCOUNTER — Telehealth (INDEPENDENT_AMBULATORY_CARE_PROVIDER_SITE_OTHER): Payer: Self-pay | Admitting: *Deleted

## 2010-05-07 ENCOUNTER — Ambulatory Visit: Payer: Self-pay | Admitting: Internal Medicine

## 2010-05-07 LAB — CONVERTED CEMR LAB
Alkaline Phosphatase: 86 units/L (ref 39–117)
BUN: 14 mg/dL (ref 6–23)
CD4 Count: 551 microliters
Creatinine, Ser: 0.82 mg/dL (ref 0.40–1.50)
Glucose, Bld: 142 mg/dL — ABNORMAL HIGH (ref 70–99)
HDL: 22 mg/dL — ABNORMAL LOW (ref >39)
HIV 1 RNA Quant: 39 copies/mL
Sodium: 140 meq/L (ref 135–145)
Total Bilirubin: 0.5 mg/dL (ref 0.3–1.2)
Total CHOL/HDL Ratio: 6.1
Total Protein: 6.4 g/dL (ref 6.0–8.3)
Triglycerides: 732 mg/dL — ABNORMAL HIGH (ref <150)

## 2010-05-21 ENCOUNTER — Encounter (INDEPENDENT_AMBULATORY_CARE_PROVIDER_SITE_OTHER): Payer: Self-pay | Admitting: *Deleted

## 2010-06-11 ENCOUNTER — Telehealth: Payer: Self-pay | Admitting: Internal Medicine

## 2010-06-24 ENCOUNTER — Encounter: Payer: Self-pay | Admitting: Internal Medicine

## 2010-07-02 ENCOUNTER — Ambulatory Visit: Payer: Self-pay | Admitting: Internal Medicine

## 2010-07-02 LAB — CONVERTED CEMR LAB
ALT: 23 units/L (ref 0–53)
AST: 21 units/L (ref 0–37)
Albumin: 4.3 g/dL (ref 3.5–5.2)
Alkaline Phosphatase: 68 units/L (ref 39–117)
Basophils Absolute: 0 10*3/uL (ref 0.0–0.1)
Basophils Relative: 1 % (ref 0–1)
Calcium: 9 mg/dL (ref 8.4–10.5)
Chloride: 102 meq/L (ref 96–112)
Cholesterol: 149 mg/dL (ref 0–200)
Eosinophils Relative: 4 % (ref 0–5)
HCT: 38.4 % — ABNORMAL LOW (ref 39.0–52.0)
HIV 1 RNA Quant: <20 copies/mL (ref <20)
Lymphocytes Relative: 35 % (ref 12–46)
MCHC: 35.7 g/dL (ref 30.0–36.0)
Neutro Abs: 1.8 10*3/uL (ref 1.7–7.7)
Platelets: 141 10*3/uL — ABNORMAL LOW (ref 150–400)
Potassium: 4.3 meq/L (ref 3.5–5.3)
RDW: 13.9 % (ref 11.5–15.5)
Sodium: 138 meq/L (ref 135–145)
Total CHOL/HDL Ratio: 5.3

## 2010-07-04 ENCOUNTER — Ambulatory Visit: Payer: Self-pay | Admitting: Internal Medicine

## 2010-07-11 ENCOUNTER — Encounter: Payer: Self-pay | Admitting: Internal Medicine

## 2010-07-18 ENCOUNTER — Emergency Department (HOSPITAL_BASED_OUTPATIENT_CLINIC_OR_DEPARTMENT_OTHER)
Admission: EM | Admit: 2010-07-18 | Discharge: 2010-07-18 | Disposition: A | Discharge status: Other Acute Inpatient Hospital | Payer: Self-pay | Source: Home / Self Care | Admitting: Emergency Medicine

## 2010-07-19 ENCOUNTER — Encounter: Payer: Self-pay | Admitting: Internal Medicine

## 2010-07-19 ENCOUNTER — Inpatient Hospital Stay (HOSPITAL_COMMUNITY)
Admission: EM | Admit: 2010-07-19 | Discharge: 2010-07-19 | Payer: Self-pay | Attending: Internal Medicine | Admitting: Internal Medicine

## 2010-07-19 DIAGNOSIS — G510 Bell's palsy: Secondary | ICD-10-CM

## 2010-07-19 HISTORY — DX: Bell's palsy: G51.0

## 2010-07-25 ENCOUNTER — Ambulatory Visit: Payer: Self-pay | Admitting: Internal Medicine

## 2010-07-31 ENCOUNTER — Telehealth (INDEPENDENT_AMBULATORY_CARE_PROVIDER_SITE_OTHER): Payer: Self-pay | Admitting: *Deleted

## 2010-08-14 ENCOUNTER — Telehealth (INDEPENDENT_AMBULATORY_CARE_PROVIDER_SITE_OTHER): Payer: Self-pay | Admitting: *Deleted

## 2010-08-15 ENCOUNTER — Encounter: Payer: Self-pay | Admitting: Internal Medicine

## 2010-09-01 LAB — CONVERTED CEMR LAB
ALT: 20 units/L (ref 0–53)
ALT: 26 units/L (ref 0–53)
AST: 19 units/L (ref 0–37)
AST: 22 units/L (ref 0–37)
AST: 23 units/L (ref 0–37)
AST: 33 units/L (ref 0–37)
Albumin: 4.3 g/dL (ref 3.5–5.2)
Albumin: 4.4 g/dL (ref 3.5–5.2)
Alkaline Phosphatase: 68 units/L (ref 39–117)
Alkaline Phosphatase: 71 units/L (ref 39–117)
Alkaline Phosphatase: 75 units/L (ref 39–117)
Alkaline Phosphatase: 86 units/L (ref 39–117)
BUN: 12 mg/dL (ref 6–23)
BUN: 17 mg/dL (ref 6–23)
CD4 % Helper T Cell: 44.6 %
CD4 Count: 535 microliters
CD4 Count: 553 microliters
Calcium: 9.3 mg/dL (ref 8.4–10.5)
Chloride: 105 meq/L (ref 96–112)
Cholesterol: 138 mg/dL (ref 0–200)
Cholesterol: 166 mg/dL (ref 0–200)
Creatinine, Ser: 0.78 mg/dL (ref 0.40–1.50)
Creatinine, Ser: 0.85 mg/dL (ref 0.40–1.50)
Creatinine, Ser: 0.9 mg/dL (ref 0.40–1.50)
Creatinine, Urine: 24.3 mg/dL
GFR calc non Af Amer: >60 mL/min (ref >60)
Glucose, Bld: 120 mg/dL — ABNORMAL HIGH (ref 70–99)
Glucose, Bld: 126 mg/dL — ABNORMAL HIGH (ref 70–99)
HCV Ab: NEGATIVE
HDL: 29 mg/dL — ABNORMAL LOW (ref >39)
HDL: 29 mg/dL — ABNORMAL LOW (ref >39)
HDL: 32 mg/dL — ABNORMAL LOW (ref >39)
HIV 1 RNA Quant: 49 copies/mL
HIV 1 RNA Quant: 49 copies/mL
Hep A Total Ab: POSITIVE — AB
Hepatitis B Surface Ag: NEGATIVE
LDL Cholesterol: 31 mg/dL (ref 0–99)
LDL Cholesterol: 43 mg/dL (ref 0–99)
Potassium: 4.1 meq/L (ref 3.5–5.3)
Potassium: 4.3 meq/L (ref 3.5–5.3)
Sodium: 137 meq/L (ref 135–145)
Sodium: 140 meq/L (ref 135–145)
Total Bilirubin: 0.5 mg/dL (ref 0.3–1.2)
Total Bilirubin: 0.6 mg/dL (ref 0.3–1.2)
Total CHOL/HDL Ratio: 4
Total CHOL/HDL Ratio: 4.6
Total CHOL/HDL Ratio: 5.7
Total Protein, Urine: 3
Total Protein: 6.8 g/dL (ref 6.0–8.3)
Total Protein: 6.8 g/dL (ref 6.0–8.3)
Triglycerides: 369 mg/dL — ABNORMAL HIGH (ref <150)
VLDL: 65 mg/dL — ABNORMAL HIGH (ref 0–40)

## 2010-09-03 NOTE — Progress Notes (Signed)
Summary: Pt. request Valtrex 1 gram as oppose to Valtrex 500mg   Phone Note Call from Patient   Caller: Patient Summary of Call: Patient called stating that there has been a mix up in the strength of his Valacyclovir.  I explained to the patient that Dr. Orvan Falconer changed the Valtrex 1gram to Valtrex 500mg  back in September 2010.  Patient said that he has always gotten the Valtrex 1 gram and that is what the pharmacy has sent him.  When looking at the EMR; it shows that Orvan Falconer has stopped the Valtrex 1 gram on 05/03/09 and changed it to Valtrex 500mg .  Patient would like it to be fixed back to what it once was.  Do you want to change it for him? Initial call taken by: Paulo Fruit  BS,CPht II,MPH,  March 04, 2010 9:29 AM  Follow-up for Phone Call        I left a message on Baron's answering machine to call me so we could discuss his dose but I have not heard from him. I will address this at his next visit. Follow-up by: Cliffton Asters MD,  March 19, 2010 12:23 PM     Appended Document: Pt. request Valtrex 1 gram as oppose to Valtrex 500mg  ok.  I will let the pharmacy no that it has been denied at this time until patient is seen.

## 2010-09-03 NOTE — Progress Notes (Signed)
Summary: Pt assist meds arrived 3 mos supply nxt reorder 05/02/10  Phone Note Refill Request      Prescriptions: LIPITOR 20 MG TABS (ATORVASTATIN CALCIUM) Take 1 tablet by mouth once a day  #90 x 0   Entered by:   Paulo Fruit  BS,CPht II,MPH   Authorized by:   Cliffton Asters MD   Signed by:   Paulo Fruit  BS,CPht II,MPH on 02/13/2010   Method used:   Samples Given   RxID:   [id number] ZOLOFT 100 MG TABS (SERTRALINE HCL) Take 3/4 tablet by mouth once a day  #60 x 0   Entered by:   Paulo Fruit  BS,CPht II,MPH   Authorized by:   Cliffton Asters MD   Signed by:   Paulo Fruit  BS,CPht II,MPH on 02/13/2010   Method used:   Samples Given   RxID:   [id number] NORVASC 5 MG TABS (AMLODIPINE BESYLATE) Take 1 tablet by mouth once a day  #90 x 0   Entered by:   Paulo Fruit  BS,CPht II,MPH   Authorized by:   Cliffton Asters MD   Signed by:   Paulo Fruit  BS,CPht II,MPH on 02/13/2010   Method used:   Samples Given   RxID:   [id number]   Patient Assist Medication Verification: Medication: zoloft 100mg  OVF#[id number] Exp Date:02/14 Tech approval:MLD                Patient Assist Medication Verification: Medication:Norvasc 5mg  JOA#[id number] Exp Date:02/16 Tech approval:MLD                Patient Assist Medication Verification: Medication:Lipitor 20mg  KZS#[id number] Exp Date:01 2014 Tech approval:MLD Call placed to patient with message that assistance medications are ready for pick-up. Paulo Fruit  BS,CPht II,MPH  February 13, 2010 12:46 PM

## 2010-09-03 NOTE — Assessment & Plan Note (Signed)
Summary: STUDY APPT/ LH    Current Allergies: No known allergies  Social History: Tobacco Use:  yes  Vital Signs:  Patient profile:   65 year old male Weight:      126.6 pounds (57.55 kg) BMI:     21.81 Temp:     96.4 degrees F oral Pulse rate:   53 / minute BP sitting:   133 / 75  (right arm) Is Patient Diabetic? No Research Study Name: ALLRT Pain Assessment Patient in pain? no      Nutritional Status BMI of 19 -24 = normal  Does patient need assistance? Functional Status Self care Ambulation Normal   Patient here for week 640 ALLRT study. No new problems or complaints voiced. Will return in June for the next visit.Deirdre Evener RN  September 19, 2009 9:35 AM   Complete Medication List: 1)  Combivir 150-300 Mg Tabs (Lamivudine-zidovudine) .... Take 1 tablet by mouth twice a day 2)  Viramune 200 Mg Tabs (Nevirapine) .... Take 1 tablet by mouth twice a day 3)  Atenolol 50 Mg Tabs (Atenolol) .... Take 1 tablet by mouth once a day 4)  Lisinopril-hydrochlorothiazide 20-25 Mg Tabs (Lisinopril-hydrochlorothiazide) .... Take 2 tablets by mouth once a day 5)  Norvasc 5 Mg Tabs (Amlodipine besylate) .... Take 1 tablet by mouth once a day 6)  Fish Oil 1000 Mg Caps (Omega-3 fatty acids) .... Take 1 capsule by mouth two times a day 7)  Lipitor 20 Mg Tabs (Atorvastatin calcium) .... Take 1 tablet by mouth once a day 8)  Prilosec 20 Mg Cpdr (Omeprazole) .... Take 1 capsule by mouth once a day 9)  Aspirin 325 Mg Tabs (Aspirin) .... Take 1 tablet by mouth once a day 10)  Zoloft 100 Mg Tabs (Sertraline hcl) .... Take 1/2 tablet by mouth once a day 11)  Valtrex 500 Mg Tabs (Valacyclovir hcl) .... Take 1 tablet by mouth two times a day for 5 days as needed for genital herpes  Other Orders: Est. Patient Research Study [phone]) T-Comprehensive Metabolic Panel [phone]) T-Lipid Profile [phone])  Process Orders Check Orders Results:     [facility]: ABN not  required for this insurance Tests Sent for requisitioning (September 19, 2009 5:14 PM):     09/19/2009: [facility] -- T-Comprehensive Metabolic Panel [[id number]] (signed)     09/19/2009: [facility] -- T-Lipid Profile [phone] (signed)

## 2010-09-03 NOTE — Assessment & Plan Note (Signed)
Summary: TB exposure, needs PPD placed  Nurse Visit   Vitals Entered By: Jennet Maduro RN (September 12, 2009 11:26 AM) CC: Pt. exposed to pt. placed on TB medications yesterday.  Pt. close friends with this new TB pt.   Allergies: No Known Drug Allergies  Immunizations Administered:  PPD Skin Test:    Vaccine Type: PPD    Site: left forearm    Mfr: Sanofi Pasteur    Dose: 0.1 ml    Route: ID    Given by: Jennet Maduro RN    Exp. Date: 12/30/2011    Lot #: [id number]  Orders Added: 1)  TB Skin Test [86580] 2)  Admin 1st Vaccine [90471]   Orders Added: 1)  TB Skin Test [86580] 2)  Admin 1st Vaccine [90471]  Appended Document: TB exposure, needs PPD placed, Neg. PPD   PPD Results    Date of reading: 09/14/2009    Results: < 5mm    Interpretation: negative

## 2010-09-03 NOTE — Assessment & Plan Note (Signed)
Summary: F/U/VS   CC:  follow-up visit.  History of Present Illness: [name] is in for his routine visit.  He has not missed any doses of his Combivir or Viramune. He did miss about a month of Lipitor and Norvasc but was able to get restarted when Byrd Hesselbach obtained a new supply about a month ago.  He has not noticed any change in his mood poor memory since increasing his Zoloft to approximately 75 mg daily.  Preventive Screening-Counseling & Management  Alcohol-Tobacco     Alcohol drinks/day: 0     Alcohol type: one glass of wine a week     Smoking Status: never     Passive Smoke Exposure: no  Caffeine-Diet-Exercise     Caffeine use/day: rarely     Does Patient Exercise: yes     Type of exercise: walking     Exercise (avg: min/session): 30-60     Times/week: 6  Hep-HIV-STD-Contraception     HIV Risk: no risk noted  Safety-Violence-Falls     Seat Belt Use: yes  Comments: declined condoms      Sexual History:  n/a.        Drug Use:  never.     Prior Medication List:  COMBIVIR 150-300 MG TABS (LAMIVUDINE-ZIDOVUDINE) take 1 tablet by mouth twice a day VIRAMUNE 200 MG TABS (NEVIRAPINE) take 1 tablet by mouth twice a day ATENOLOL 50 MG TABS (ATENOLOL) take 1 tablet by mouth once a day LISINOPRIL-HYDROCHLOROTHIAZIDE 20-25 MG TABS (LISINOPRIL-HYDROCHLOROTHIAZIDE) Take 2 tablets by mouth once a day NORVASC 5 MG TABS (AMLODIPINE BESYLATE) Take 1 tablet by mouth once a day FISH OIL 1000 MG CAPS (OMEGA-3 FATTY ACIDS) Take 1 capsule by mouth two times a day LIPITOR 20 MG TABS (ATORVASTATIN CALCIUM) Take 1 tablet by mouth once a day PRILOSEC 20 MG CPDR (OMEPRAZOLE) take 1 capsule by mouth once a day ASPIRIN 325 MG TABS (ASPIRIN) Take 1 tablet by mouth once a day ZOLOFT 100 MG TABS (SERTRALINE HCL) Take 1/2 tablet by mouth once a day VALTREX 500 MG TABS (VALACYCLOVIR HCL) Take 1 tablet by mouth two times a day for 5 days as needed for genital herpes   Current Allergies (reviewed  today): No known allergies  Vital Signs:  Patient profile:   65 year old male Height:      64 inches (162.56 cm) Weight:      124.25 pounds (56.48 kg) BMI:     21.40 Temp:     97.5 degrees F (36.39 degrees C) oral Pulse rate:   65 / minute BP sitting:   154 / 94  (left arm) Cuff size:   regular  Vitals Entered By: Jennet Maduro RN (Dec 18, 2009 10:03 AM) CC: follow-up visit Is Patient Diabetic? No Pain Assessment Patient in pain? no      Nutritional Status BMI of 19 -24 = normal Nutritional Status Detail "I don't usually have an appetite."  Have you ever been in a relationship where you felt threatened, hurt or afraid?No   Does patient need assistance? Functional Status Self care Ambulation Normal Comments no missed doses of HIV rxes   Physical Exam  General:  alert and well-nourished, but weight is down 3 more pounds. Mouth:  Oral mucosa and oropharynx without lesions or exudates.  Teeth in good repair. Lungs:  normal breath sounds, no crackles, and no wheezes.   Heart:  normal rate, regular rhythm, and no murmur.   Skin:  no rashes.   Psych:  normally  interactive, good eye contact, not anxious appearing, and not depressed appearing.      Impression & Recommendations:  Problem # 1:  HIV INFECTION (ICD-042) His HIV infection remains under excellent control.  I will continue his current regimen His updated medication list for this problem includes:    Valtrex 500 Mg Tabs (Valacyclovir hcl) .Marland Kitchen... Take 1 tablet by mouth two times a day for 5 days as needed for genital herpes  Diagnostics Reviewed:  CD4: 590 (12/03/2009)   CD4 %: 44.6 (09/21/2006) WBC: 5.5 (12/03/2009)   Hgb: 14.6 (12/03/2009)   HCT: 41.6 (12/03/2009)   Platelets: 167 (12/03/2009) HIV-1 RNA: <48 copies/mL (12/03/2009)   HBSAg: NEG (08/22/2008)  Orders: Est. Patient Level IV (99214)Future Orders: T-Lipid Profile ([id number]) ... 01/29/2010  Problem # 2:  MEMORY LOSS (ICD-780.93) The exact  cause of his memory loss over the last year or two it is unclear but may be related to depression.  He has not tolerated higher doses of Zoloft and does not appear to be clinically depressed at this time.  He's had no progression of his memory loss so I will continue the current dose of Zoloft Orders: Est. Patient Level IV (13244)  Problem # 3:  DYSLIPIDEMIA (ICD-272.4) Hhis triglyceride may be elevated because of having been off of Lipitor.  He wants to retry gemfibrozil because he's had trouble remembering to take the fish oil capsules.  I will start this and see him back in 6 weeks after a CK level. His updated medication list for this problem includes:    Lipitor 20 Mg Tabs (Atorvastatin calcium) .Marland Kitchen... Take 1 tablet by mouth once a day    Gemfibrozil 600 Mg Tabs (Gemfibrozil) .Marland Kitchen... Take 1 tablet by mouth two times a day  Orders: Est. Patient Level IV (99214)Future Orders: T-CK Total ([id number]) ... 01/15/2010  Problem # 4:  HYPERTENSION (ICD-401.9) His blood pressure is elevated today.  I will see him back in 6 weeks to recheck it. His updated medication list for this problem includes:    Atenolol 50 Mg Tabs (Atenolol) .Marland Kitchen... Take 1 tablet by mouth once a day    Lisinopril-hydrochlorothiazide 20-25 Mg Tabs (Lisinopril-hydrochlorothiazide) .Marland Kitchen... Take 2 tablets by mouth once a day    Norvasc 5 Mg Tabs (Amlodipine besylate) .Marland Kitchen... Take 1 tablet by mouth once a day  Orders: Est. Patient Level IV (44034)  Problem # 5:  DIABETES MELLITUS, TYPE II (ICD-250.00)  His updated medication list for this problem includes:    Lisinopril-hydrochlorothiazide 20-25 Mg Tabs (Lisinopril-hydrochlorothiazide) .Marland Kitchen... Take 2 tablets by mouth once a day    Aspirin 325 Mg Tabs (Aspirin) .Marland Kitchen... Take 1 tablet by mouth once a day  Orders: Est. Patient Level IV (99214)Future Orders: T-Hgb A1C (in-house) ([id number]) ... 01/15/2010  Medications Added to Medication List This Visit: 1)  Zoloft 100 Mg Tabs  (Sertraline hcl) .... Take 3/4 tablet by mouth once a day 2)  Gemfibrozil 600 Mg Tabs (Gemfibrozil) .... Take 1 tablet by mouth two times a day  Patient Instructions: 1)  Please schedule a follow-up appointment in 6 weeks.  Prescriptions: GEMFIBROZIL 600 MG TABS (GEMFIBROZIL) Take 1 tablet by mouth two times a day  #180 x 3   Entered and Authorized by:   Cliffton Asters MD   Signed by:   Cliffton Asters MD on 12/18/2009   Method used:   Print then Give to Patient   RxID:   [id number]

## 2010-09-03 NOTE — Progress Notes (Signed)
Summary: reorder for [employer] PAP pending on pt's sig  Phone Note Call from Patient   Caller: Patient Summary of Call: Patient called to reorder his mediations via Pfizer PAP Norvasc, Zoloft and Lipitor.  Patient's enrollment has expired therefore not letting me reorder via phone.  patient will need to come in and sign a new form.  Patient is aware and will come on Tuesday, 04/23/10 to complete that. Initial call taken by: Paulo Fruit  BS,CPht II,MPH,  April 22, 2010 4:30 PM  Follow-up for Phone Call        application signed by both physician and patient and mailed today. Follow-up by: Paulo Fruit  BS,CPht II,MPH,  April 24, 2010 2:40 PM

## 2010-09-03 NOTE — Assessment & Plan Note (Signed)
   Process Orders Check Orders Results:     [facility]: ABN not required for this insurance Tests Sent for requisitioning (September 19, 2009 9:29 AM):     09/19/2009: [facility] -- T-RPR (Syphilis) [phone] (signed)

## 2010-09-03 NOTE — Letter (Signed)
Summary: [employer]Disability Claim  [employer] [facility]Disability Claim   Imported By: Florinda Marker 02/15/2010 14:33:55  _____________________________________________________________________  External Attachment:    Type:   Image     Comment:   External Document

## 2010-09-03 NOTE — Assessment & Plan Note (Signed)
Summary: Flu Vaccines   Immunizations Administered:  Influenza Vaccine # 1:    Vaccine Type: Fluvax 3+    Site: right deltoid    Mfr: Fluvirin    Dose: 0.5 ml    Route: IM    Given by: Tomasita Morrow RN    Exp. Date: 11/03/2010    Lot #: [id number]    VIS given: 02/26/10 version given April 10, 2010.  Flu Vaccine Consent Questions:    Do you have a history of severe allergic reactions to this vaccine? no    Any prior history of allergic reactions to egg and/or gelatin? no    Do you have a sensitivity to the preservative Thimersol? no    Do you have a past history of Guillan-Barre Syndrome? no    Do you currently have an acute febrile illness? no    Have you ever had a severe reaction to latex? no    Vaccine information given and explained to patient? yes

## 2010-09-03 NOTE — Miscellaneous (Signed)
Summary: [employer]  [employer]   Imported By: Florinda Marker 06/25/2010 09:40:29  _____________________________________________________________________  External Attachment:    Type:   Image     Comment:   External Document

## 2010-09-03 NOTE — Progress Notes (Signed)
Summary: Pt assist meds arrived via Pfizer PAP for 3 months supply  Phone Note Refill Request      Prescriptions: ZOLOFT 100 MG TABS (SERTRALINE HCL) Take 1/2 tablet by mouth once a day  #90 x 0   Entered by:   Paulo Fruit  BS,CPht II,MPH   Authorized by:   Cliffton Asters MD   Signed by:   Paulo Fruit  BS,CPht II,MPH on 12/05/2009   Method used:   Samples Given   RxID:   [id number] LIPITOR 20 MG TABS (ATORVASTATIN CALCIUM) Take 1 tablet by mouth once a day  #90 x 0   Entered by:   Paulo Fruit  BS,CPht II,MPH   Authorized by:   Cliffton Asters MD   Signed by:   Paulo Fruit  BS,CPht II,MPH on 12/05/2009   Method used:   Samples Given   RxID:   [id number] NORVASC 5 MG TABS (AMLODIPINE BESYLATE) Take 1 tablet by mouth once a day  #90 x 0   Entered by:   Paulo Fruit  BS,CPht II,MPH   Authorized by:   Cliffton Asters MD   Signed by:   Paulo Fruit  BS,CPht II,MPH on 12/05/2009   Method used:   Samples Given   RxID:   [id number]   Patient Assist Medication Verification: Medication: Norvasc 5mg  Lot# [id number] Exp Date:09 15 Tech approval:MLD                Patient Assist Medication Verification: Medication:Lipitor 20mg  Lot# [id number] Exp Date:10 2013 Tech approval:MLD                Patient Assist Medication Verification: Medication:Zoloft 100mg  Lot# [id number] Exp Date:08 13 Tech approval:MLD Call placed to patient with message that assistance medications are ready for pick-up. Patient is on his way to picking them up. Paulo Fruit  BS,CPht II,MPH  Dec 05, 2009 2:58 PM

## 2010-09-03 NOTE — Assessment & Plan Note (Signed)
Summary: 6 wks. fu [mkj]   CC:  follow-up visit and Depression.  History of Present Illness: Bishop is in for isr routine visit. He says that he now realizes he has been depressed and understands that that may be causing some of his memory problems.  He has not noted any changes in his memory.  He says his favorite thing to do is to read his daily Bible passage but he often notes that he will not recall what he read several hours later.  He is trying to force himself to get out of the house more at work in the garden.  He says he made a decision to force might Coble to move out of his apartment recently.  Casimiro Needle is continuing to use crack cocaine.  Asim is looking forward to the birth of his first grandchild next week.  Depression History:      The patient denies a depressed mood most of the day and a diminished interest in his usual daily activities.         Preventive Screening-Counseling & Management  Alcohol-Tobacco     Alcohol drinks/day: 0     Alcohol type: one glass of wine a week     Smoking Status: never     Passive Smoke Exposure: no  Caffeine-Diet-Exercise     Caffeine use/day: rarely     Does Patient Exercise: yes     Type of exercise: walking     Exercise (avg: min/session): 30-60     Times/week: 6  Hep-HIV-STD-Contraception     HIV Risk: no risk noted  Safety-Violence-Falls     Seat Belt Use: yes      Sexual History:  n/a.        Drug Use:  never.     Updated Prior Medication List: COMBIVIR 150-300 MG TABS (LAMIVUDINE-ZIDOVUDINE) take 1 tablet by mouth twice a day VIRAMUNE 200 MG TABS (NEVIRAPINE) take 1 tablet by mouth twice a day ATENOLOL 50 MG TABS (ATENOLOL) take 1 tablet by mouth once a day LISINOPRIL-HYDROCHLOROTHIAZIDE 20-25 MG TABS (LISINOPRIL-HYDROCHLOROTHIAZIDE) Take 2 tablets by mouth once a day NORVASC 5 MG TABS (AMLODIPINE BESYLATE) Take 1 tablet by mouth once a day FISH OIL 1000 MG CAPS (OMEGA-3 FATTY ACIDS) Take 1 capsule by mouth two times a  day LIPITOR 20 MG TABS (ATORVASTATIN CALCIUM) Take 1 tablet by mouth once a day PRILOSEC 20 MG CPDR (OMEPRAZOLE) take 1 capsule by mouth once a day ASPIRIN 325 MG TABS (ASPIRIN) Take 1 tablet by mouth once a day ZOLOFT 100 MG TABS (SERTRALINE HCL) Take 3/4 tablet by mouth once a day VALTREX 500 MG TABS (VALACYCLOVIR HCL) Take 1 tablet by mouth two times a day for 5 days as needed for genital herpes  Current Allergies (reviewed today): No known allergies  Vital Signs:  Patient profile:   65 year old male Height:      64 inches (162.56 cm) Weight:      119.25 pounds (54.20 kg) BMI:     20.54 Temp:     98.5 degrees F (36.94 degrees C) oral Pulse rate:   60 / minute BP sitting:   134 / 79  (left arm) Cuff size:   regular  Vitals Entered By: Jennet Maduro RN (February 07, 2010 9:56 AM) CC: follow-up visit, Depression Is Patient Diabetic? No Pain Assessment Patient in pain? no      Nutritional Status BMI of 19 -24 = normal Nutritional Status Detail appetite "improving"  Have you ever been  in a relationship where you felt threatened, hurt or afraid?No   Does patient need assistance? Functional Status Self care Ambulation Normal Comments no missed doses of rxes        Medication Adherence: 02/07/2010   Adherence to medications reviewed with patient. Counseling to provide adequate adherence provided                                 Physical Exam  General:  alert and well-nourished.   Mouth:  Oral mucosa and oropharynx without lesions or exudates.  Teeth in good repair. Lungs:  normal breath sounds, no crackles, and no wheezes.   Heart:  normal rate, regular rhythm, and no murmur.   Psych:  normally interactive, good eye contact, not anxious appearing, and not depressed appearing.     Impression & Recommendations:  Problem # 1:  HIV INFECTION (ICD-042) His infection is under excellent control.  I will not make any changes today. His updated medication list for  this problem includes:    Valtrex 500 Mg Tabs (Valacyclovir hcl) .Marland Kitchen... Take 1 tablet by mouth two times a day for 5 days as needed for genital herpes  Diagnostics Reviewed:  CD4: 590 (12/03/2009)   CD4 %: 44.6 (09/21/2006) WBC: 4.4 (01/11/2010)   Hgb: 13.0 (01/11/2010)   HCT: 36.4 (01/11/2010)   Platelets: 205 (01/11/2010) HIV-1 RNA: <48 copies/mL (12/03/2009)   HBSAg: NEG (08/22/2008)  Orders: Est. Patient Level IV (21308) Est. Patient Level IV (99214)Future Orders: T-CD4SP ([facility]) (CD4SP) ... 08/06/2010 T-HIV Viral Load [phone]) ... 08/06/2010 T-Comprehensive Metabolic Panel [phone]) ... 08/06/2010 T-CBC w/Diff ([id number]) ... 08/06/2010 T-RPR (Syphilis) [id number]) ... 08/06/2010 T-Lipid Profile [phone]) ... 08/06/2010  Problem # 2:  DYSLIPIDEMIA (ICD-272.4) his lipids are doing better. The following medications were removed from the medication list:    Gemfibrozil 600 Mg Tabs (Gemfibrozil) .Marland Kitchen... Take 1 tablet by mouth two times a day His updated medication list for this problem includes:    Lipitor 20 Mg Tabs (Atorvastatin calcium) .Marland Kitchen... Take 1 tablet by mouth once a day  Orders: Est. Patient Level IV (95188)  Problem # 3:  DEPRESSION (ICD-311) he is willing to enter counseling. His updated medication list for this problem includes:    Zoloft 100 Mg Tabs (Sertraline hcl) .Marland Kitchen... Take 3/4 tablet by mouth once a day  Orders: Est. Patient Level IV (41660) Misc. Referral (Misc. Ref)  Patient Instructions: 1)  Please schedule a follow-up appointment in 6 months.

## 2010-09-03 NOTE — Assessment & Plan Note (Signed)
Summary: TB  Prior Medications: COMBIVIR 150-300 MG TABS (LAMIVUDINE-ZIDOVUDINE) take 1 tablet by mouth twice a day VIRAMUNE 200 MG TABS (NEVIRAPINE) take 1 tablet by mouth twice a day ATENOLOL 50 MG TABS (ATENOLOL) take 1 tablet by mouth once a day LISINOPRIL-HYDROCHLOROTHIAZIDE 20-25 MG TABS (LISINOPRIL-HYDROCHLOROTHIAZIDE) Take 2 tablets by mouth once a day NORVASC 5 MG TABS (AMLODIPINE BESYLATE) Take 1 tablet by mouth once a day FISH OIL 1000 MG CAPS (OMEGA-3 FATTY ACIDS) Take 1 capsule by mouth two times a day LIPITOR 20 MG TABS (ATORVASTATIN CALCIUM) Take 1 tablet by mouth once a day PRILOSEC 20 MG CPDR (OMEPRAZOLE) take 1 capsule by mouth once a day ASPIRIN 325 MG TABS (ASPIRIN) Take 1 tablet by mouth once a day ZOLOFT 100 MG TABS (SERTRALINE HCL) Take 1/2 tablet by mouth once a day VALTREX 500 MG TABS (VALACYCLOVIR HCL) Take 1 tablet by mouth two times a day for 5 days as needed for genital herpes Current Allergies: No known allergies  Immunizations Administered:  PPD Skin Test:    Vaccine Type: PPD    Site: left forearm    Mfr: Sanofi Pasteur    Dose: 0.1 ml    Route: ID    Given by: Starleen Arms CMA    Exp. Date: 12/07/2011    Lot #: [id number]  Orders Added: 1)  TB Skin Test [86580] 2)  Admin 1st Vaccine [90471]  Appended Document: Immunization Entry      PPD Results    Date of reading: 12/06/2009    Results: < 5mm    Interpretation: negative

## 2010-09-03 NOTE — Letter (Signed)
Summary: From Pt.  From Pt.   Imported By: Florinda Marker 07/03/2010 15:10:17  _____________________________________________________________________  External Attachment:    Type:   Image     Comment:   External Document

## 2010-09-03 NOTE — Miscellaneous (Signed)
Summary: Orders Update - labs  Clinical Lists Changes  Orders: Added new Test order of T-CD4SP [facility]) (CD4SP) - Signed Added new Test order of T-HIV Viral Load [phone]) - Signed

## 2010-09-03 NOTE — Progress Notes (Signed)
Summary: Refill reorder for patient assistance medications  Refill reorder for Lipitor, Norvasc and Zoloft via [employer] [facility] PAP.  Pt's ID is [id number] for future orders.  His order will arrive within 7-10 business days.  The confirmation number is [id number]. Paulo Fruit  BS,CPht II,MPH  February 05, 2010 4:51 PM

## 2010-09-03 NOTE — Assessment & Plan Note (Signed)
Summary: STUDY APPT/ LH    Current Allergies: No known allergies  Vital Signs:  Patient profile:   65 year old male Height:      64 inches (162.56 cm) Weight:      125.5 pounds (57.05 kg) BMI:     21.62 Temp:     97.8 degrees F oral Pulse rate:   60 / minute BP sitting:   118 / 72  (left arm) Is Patient Diabetic? No Pain Assessment Patient in pain? no      Nutritional Status BMI of 19 -24 = normal  Does patient need assistance? Functional Status Self care Ambulation Normal   Patient here for week 672 Allrt visit. He denies any new problems. He recently had a new granddaughter born and is very happy about the event since she lives close by. Still has some short term memory problems.Deirdre Evener RN  May 07, 2010 9:12 AM    Other Orders: Est. Patient Research Study [id number]) T-Comprehensive Metabolic Panel [id number]) T-Urine Protein [id number]) T-Urine Creatinine [id number]) T-Lipid Profile [id number]) T-Hepatitis B Surface Antigen [phone]) T-Hepatitis B Core Antibody [id number]) T-Hepatitis C Antibody ([id number]) T-Hepatitis A Antibody ([id number])

## 2010-09-03 NOTE — Assessment & Plan Note (Signed)
Summary: STUDY APPT/ LH    Current Allergies: No known allergies  Vital Signs:  Patient profile:   65 year old male Weight:      117.75 pounds (53.52 kg) BMI:     20.28 Temp:     97.6 degrees F oral Pulse rate:   57 / minute BP sitting:   96 / 57  (left arm) Is Patient Diabetic? No Pain Assessment Patient in pain? no      Nutritional Status BMI of 19 -24 = normal  Does patient need assistance? Functional Status Self care Ambulation Normal   Patient here for week 656 ALLRT study visit. He denies any new problems, except for his shoulders aching more. He will return in October.Deirdre Evener RN  January 11, 2010 10:01 AM    Other Orders: Est. Patient Research Study [phone]) T-CBC w/Diff [phone]) T-Comprehensive Metabolic Panel [phone]) T-Lipid Profile [phone])

## 2010-09-03 NOTE — Assessment & Plan Note (Signed)
Summary: F/U pt. want to seen early [MKJ]   CC:  follow-up visit.  History of Present Illness: [name] is in for his routine visit.  He is continued to have some problems obtaining his medicines due to the excessive cost and the large number of medications.  He has been out of his lisinopril and hydrochlorothiazide for the last 4 days but plans on getting a refill this afternoon.  He has found a discount pharmacy where he believes he will be able to afford his gemfibrozil starting next month.  He has had no problems obtaining his Combivir and Viramune.  He does however think that he has missed about 3 doses since his last visit when he got distracted during the evening and forgot to take them.  He says that he thinks his depression is actually a little better since he has been meeting with the counselor.  He finds that helpful.  He has not noted any difference in his short-term memory loss.  He says that he does his daily he by able readings and within about 5 minutes cannot recall anything that he is read.  Preventive Screening-Counseling & Management  Alcohol-Tobacco     Alcohol drinks/day: 0     Alcohol type: one glass of wine a week     Smoking Status: never     Passive Smoke Exposure: no  Caffeine-Diet-Exercise     Caffeine use/day: rarely     Does Patient Exercise: yes     Type of exercise: walking     Exercise (avg: min/session): 30-60     Times/week: 6  Hep-HIV-STD-Contraception     HIV Risk: no risk noted     HIV Risk Counseling: not indicated-no HIV risk noted  Safety-Violence-Falls     Seat Belt Use: yes  Comments: declined condoms      Sexual History:  n/a.        Drug Use:  never.     Prior Medication List:  COMBIVIR 150-300 MG TABS (LAMIVUDINE-ZIDOVUDINE) take 1 tablet by mouth twice a day VIRAMUNE 200 MG TABS (NEVIRAPINE) take 1 tablet by mouth twice a day ATENOLOL 50 MG TABS (ATENOLOL) take 1 tablet by mouth once a day LISINOPRIL-HYDROCHLOROTHIAZIDE 20-25 MG  TABS (LISINOPRIL-HYDROCHLOROTHIAZIDE) Take 2 tablets by mouth once a day NORVASC 5 MG TABS (AMLODIPINE BESYLATE) Take 1 tablet by mouth once a day FISH OIL 1000 MG CAPS (OMEGA-3 FATTY ACIDS) Take 1 capsule by mouth two times a day LIPITOR 20 MG TABS (ATORVASTATIN CALCIUM) Take 1 tablet by mouth once a day PRILOSEC 20 MG CPDR (OMEPRAZOLE) take 1 capsule by mouth once a day ASPIRIN 325 MG TABS (ASPIRIN) Take 1 tablet by mouth once a day ZOLOFT 100 MG TABS (SERTRALINE HCL) Take 3/4 tablet by mouth once a day VALTREX 500 MG TABS (VALACYCLOVIR HCL) Take 1 tablet by mouth two times a day for 5 days as needed for genital herpes   Current Allergies (reviewed today): No known allergies  Vital Signs:  Patient profile:   65 year old male Height:      64 inches (162.56 cm) Weight:      126.0 pounds (57.27 kg) BMI:     21.71 Temp:     98.2 degrees F (36.78 degrees C) oral Pulse rate:   66 / minute BP sitting:   174 / 88  (left arm) Cuff size:   regular  Vitals Entered By: Jennet Maduro RN (July 04, 2010 10:08 AM) CC: follow-up visit Is Patient Diabetic? No  Pain Assessment Patient in pain? no      Nutritional Status BMI of 19 -24 = normal Nutritional Status Detail appetite "no appetite "  Have you ever been in a relationship where you felt threatened, hurt or afraid?No   Does patient need assistance? Functional Status Self care Ambulation Normal Comments Has been out of his B/P rx for the last three, he miscalculated when to pick up and his $.   Physical Exam  General:  alert and well-nourished.   Mouth:  Oral mucosa and oropharynx without lesions or exudates.  Teeth in good repair. Lungs:  normal breath sounds, no crackles, and no wheezes.   Heart:  normal rate, regular rhythm, and no murmur.   Abdomen:  soft, non-tender, normal bowel sounds, and no masses.   Msk:  normal ROM, no joint tenderness, no joint swelling, and no joint warmth.   Neurologic:  alert & oriented X3  and gait normal.   Skin:  no rashes.   Axillary Nodes:  no R axillary adenopathy and no L axillary adenopathy.   Psych:  normally interactive, good eye contact, not anxious appearing, and not depressed appearing.          Medication Adherence: 07/04/2010   Adherence to medications reviewed with patient. Counseling to provide adequate adherence provided            07/04/2010   Patient was screened for depression. Referal was made as indicated.                      Impression & Recommendations:  Problem # 1:  HIV INFECTION (ICD-042) [name]'s HIV infection remains under very good control.  I will continue his current regimen. His updated medication list for this problem includes:    Valtrex 500 Mg Tabs (Valacyclovir hcl) .Marland Kitchen... Take 1 tablet by mouth two times a day for 5 days as needed for genital herpes  Diagnostics Reviewed:  CD4: 490 (07/03/2010)   CD4 %: 44.6 (09/21/2006) WBC: 3.6 (07/02/2010)   Hgb: 13.7 (07/02/2010)   HCT: 38.4 (07/02/2010)   Platelets: 141 (07/02/2010) HIV-1 RNA: 39 (05/07/2010)   HBSAg: NEG (05/07/2010)  Orders: Est. Patient Level IV (99214)Future Orders: T-CD4SP ([facility]) (CD4SP) ... 12/31/2010 T-HIV Viral Load [id number]) ... 12/31/2010 T-Comprehensive Metabolic Panel [id number]) ... 12/31/2010 T-CBC w/Diff ([id number]) ... 12/31/2010 T-RPR (Syphilis) [id number]) ... 12/31/2010 T-Lipid Profile [id number]) ... 12/31/2010  Problem # 2:  DYSLIPIDEMIA (ICD-272.4) His triglyceride level has been elevated for his last two visits since he has been off of gemfibrozil.  I am pleased that he will be able to restart that soon. His updated medication list for this problem includes:    Gemfibrozil 600 Mg Tabs (Gemfibrozil) .Marland Kitchen... Take 1 tablet by mouth two times a day    Lipitor 20 Mg Tabs (Atorvastatin calcium) .Marland Kitchen... Take 1 tablet by mouth once a day  Orders: Est. Patient Level IV (03474)  Problem # 3:  DEPRESSION (ICD-311) his depression is  improving. His updated medication list for this problem includes:    Zoloft 100 Mg Tabs (Sertraline hcl) .Marland Kitchen... Take 3/4 tablet by mouth once a day  Orders: Est. Patient Level IV (25956)  Problem # 4:  MEMORY LOSS (ICD-780.93) I still do not know the cause of his short-term memory loss.  He does not feel it is getting worse but concerned about his increased number of missed doses of his medications.  I have asked him to set his cell phone alarm to remind him  to take his medications. Orders: Est. Patient Level IV (13086)  Problem # 5:  HYPERTENSION (ICD-401.9) His blood pressure is elevated but this is probably due to the fact that he's been off of two of his medications for the last 4 days. His updated medication list for this problem includes:    Atenolol 50 Mg Tabs (Atenolol) .Marland Kitchen... Take 1 tablet by mouth once a day    Lisinopril-hydrochlorothiazide 20-25 Mg Tabs (Lisinopril-hydrochlorothiazide) .Marland Kitchen... Take 2 tablets by mouth once a day    Norvasc 5 Mg Tabs (Amlodipine besylate) .Marland Kitchen... Take 1 tablet by mouth once a day  Orders: Est. Patient Level IV (99214)  BP today: 174/88 Prior BP: 118/72 (05/07/2010)  Labs Reviewed: K+: 4.3 (07/02/2010) Creat: : 0.70 (07/02/2010)   Chol: 149 (07/02/2010)   HDL: 28 (07/02/2010)   LDL: See Comment mg/dL (57/84/6962)   TG: 952 (07/02/2010)  Medications Added to Medication List This Visit: 1)  Gemfibrozil 600 Mg Tabs (Gemfibrozil) .... Take 1 tablet by mouth two times a day  Patient Instructions: 1)  Please schedule a follow-up appointment in 6 months.  Prescriptions: GEMFIBROZIL 600 MG TABS (GEMFIBROZIL) Take 1 tablet by mouth two times a day  #60 x 11   Entered and Authorized by:   Cliffton Asters MD   Signed by:   Cliffton Asters MD on 07/04/2010   Method used:   Print then Give to Patient   RxID:   [id number]           Medication Adherence: 07/04/2010   Adherence to medications reviewed with patient. Counseling to provide adequate  adherence provided           07/04/2010   Patient was screened for depression. Referal was made as indicated.                      Process Orders Check Orders Results:     [facility]: ABN not required for this insurance Tests Sent for requisitioning (July 04, 2010 1:24 PM):     12/31/2010: [facility] -- T-HIV Viral Load [id number] (signed)     12/31/2010: [facility] -- T-Comprehensive Metabolic Panel [[id number]] (signed)     12/31/2010: [facility] -- T-CBC w/Diff [[id number]] (signed)     12/31/2010: [facility] -- T-RPR (Syphilis) [id number] (signed)     12/31/2010: [facility] -- T-Lipid Profile [phone] (signed)    Appended Document: F/U pt. want to seen early [MKJ]   Pneumovax Vaccine    Vaccine Type: Pneumovax (Medicare)    Site: left deltoid    Mfr: Merck    Dose: 0.5 ml    Route: IM    Given by: Jennet Maduro RN    Exp. Date: 12/12/2011    Lot #: [id number]

## 2010-09-03 NOTE — Miscellaneous (Signed)
Summary: HIV-1 RNA, CD4 (RESEARCH)  Clinical Lists Changes  Observations: Added new observation of CD4 COUNT: 551 microliters (05/07/2010 8:34) Added new observation of HIV1RNA QA: 39 copies/mL (05/07/2010 8:34)

## 2010-09-03 NOTE — Progress Notes (Signed)
Summary: refill/mld  Phone Note Call from Patient   Caller: Patient Reason for Call: Refill Medication Summary of Call: Patient called this morning requesting a refill for his Valtrex be sent to [facility]. Initial call taken by: Paulo Fruit  BS,CPht II,MPH,  February 28, 2010 9:19 AM    Prescriptions: VALTREX 500 MG TABS (VALACYCLOVIR HCL) Take 1 tablet by mouth two times a day for 5 days as needed for genital herpes  #10 x 4   Entered by:   Paulo Fruit  BS,CPht II,MPH   Authorized by:   Cliffton Asters MD   Signed by:   Paulo Fruit  BS,CPht II,MPH on 02/28/2010   Method used:   Electronically to        [facility] [phone]* (retail)       [address]       [place], [place]  [address]       Ph: [phone]       Fax:    RxID:   [id number]  Paulo Fruit  BS,CPht II,MPH  February 28, 2010 9:19 AM

## 2010-09-03 NOTE — Miscellaneous (Signed)
Summary: HIV-1 RNA, CD4 (RESEARCH)  Clinical Lists Changes  Observations: Added new observation of CD4 COUNT: 505 microliters (09/19/2009 13:01) Added new observation of HIV1RNA QA: 39 copies/mL (09/19/2009 13:01)

## 2010-09-03 NOTE — Consult Note (Signed)
Summary: [facility]: Stress Test  [facility]: Stress Test   Imported By: Florinda Marker 10/23/2009 14:40:22  _____________________________________________________________________  External Attachment:    Type:   Image     Comment:   External Document

## 2010-09-03 NOTE — Miscellaneous (Signed)
Summary: have we gotten answer from [employer]?   Clinical Lists Changes new appp was sent 04/24/10 for his meds.. have we rec'd a confirmation that he can still get them?Marland KitchenGolden Circle RN  May 21, 2010 7:54 AM   Phone call to pt. to find out whether he is now receiving his medications.  Message left to call back and let the clinic know about his medications. Jennet Maduro RN  May 21, 2010 4:50 PM  PAP rxes Norvasc, Zoloft and Lipitor received in the clinic. Jennet Maduro RN  June 11, 2010 5:02 PM

## 2010-09-03 NOTE — Progress Notes (Signed)
Summary: Refill reorder via [employer] patient assistance program  Refill reorder for patient's Lipitor, Norvasc, and Zoloft.  His ID number for future orders is [id number].  Patient's medication will arrive to our new clinic within 7-10 business days.  The confirmation number is [id number]. Paulo Fruit  BS,CPht II,MPH  November 26, 2009 2:02 PM

## 2010-09-03 NOTE — Miscellaneous (Signed)
Summary: clinical update/ryan white [facility] appr til 11/02/10  Clinical Lists Changes  Observations: Added new observation of AIDSDAP: Yes 2011 (10/23/2009 15:41)

## 2010-09-03 NOTE — Miscellaneous (Signed)
Summary: [place] Disability Adjudication & Review  [place] Disability Adjudication & Review   Imported By: Florinda Marker 07/03/2010 09:25:45  _____________________________________________________________________  External Attachment:    Type:   Image     Comment:   External Document

## 2010-09-03 NOTE — Letter (Signed)
Summary: [facility] Disability  [employer] [facility] [employer] [facility] Disability   Imported By: Florinda Marker 09/10/2009 10:29:01  _____________________________________________________________________  External Attachment:    Type:   Image     Comment:   External Document

## 2010-09-03 NOTE — Miscellaneous (Signed)
Summary: clinical update/ryan white  Clinical Lists Changes  Observations: Added new observation of PCTFPL: 78.84  (11/27/2009 15:14) Added new observation of INCOMESOURCE: Disability  (11/27/2009 15:14) Added new observation of HOUSEINCOME: 8538  (11/27/2009 15:14) Added new observation of FINASSESSDT: 11/27/2009  (11/27/2009 15:14) Added new observation of MARITAL STAT: Single  (11/27/2009 15:14)

## 2010-09-03 NOTE — Assessment & Plan Note (Signed)
Summary: tic bites last week, erythematous and raised /dde   CC:  complaining of three tic bites, right side, and right calf and left hip/areas are still red.  History of Present Illness: Pt had 3 tick bites about a week ago.  All  were attachec and one was engorged. No fever, HA or rash.  The bite sites a slighty red so he wanted to have them checked out.  Preventive Screening-Counseling & Management  Alcohol-Tobacco     Alcohol drinks/day: 0     Alcohol type: one glass of wine a week     Smoking Status: never     Passive Smoke Exposure: no  Caffeine-Diet-Exercise     Caffeine use/day: rarely     Does Patient Exercise: yes     Type of exercise: walking     Exercise (avg: min/session): 30-60     Times/week: 6  Hep-HIV-STD-Contraception     HIV Risk: no risk noted  Safety-Violence-Falls     Seat Belt Use: yes      Sexual History:  n/a.        Drug Use:  never.     Updated Prior Medication List: COMBIVIR 150-300 MG TABS (LAMIVUDINE-ZIDOVUDINE) take 1 tablet by mouth twice a day VIRAMUNE 200 MG TABS (NEVIRAPINE) take 1 tablet by mouth twice a day ATENOLOL 50 MG TABS (ATENOLOL) take 1 tablet by mouth once a day LISINOPRIL-HYDROCHLOROTHIAZIDE 20-25 MG TABS (LISINOPRIL-HYDROCHLOROTHIAZIDE) Take 2 tablets by mouth once a day NORVASC 5 MG TABS (AMLODIPINE BESYLATE) Take 1 tablet by mouth once a day FISH OIL 1000 MG CAPS (OMEGA-3 FATTY ACIDS) Take 1 capsule by mouth two times a day LIPITOR 20 MG TABS (ATORVASTATIN CALCIUM) Take 1 tablet by mouth once a day PRILOSEC 20 MG CPDR (OMEPRAZOLE) take 1 capsule by mouth once a day ASPIRIN 325 MG TABS (ASPIRIN) Take 1 tablet by mouth once a day ZOLOFT 100 MG TABS (SERTRALINE HCL) Take 1/2 tablet by mouth once a day VALTREX 500 MG TABS (VALACYCLOVIR HCL) Take 1 tablet by mouth two times a day for 5 days as needed for genital herpes  Current Allergies (reviewed today): No known allergies  Past History:  Past Medical History: Last  updated: 07/20/2008 Depression Hypertension Coronary artery disease Diabetes mellitus, type II GERD HIV disease - Dr Jonny Ruiz Campbell/ID Hyperlipidemia chronic right hearing loss - 100% - per ENT Nephrolithiasis, hx of Peripheral vascular disease  Review of Systems  The patient denies fever and headaches.    Vital Signs:  Patient profile:   65 year old male Height:      64 inches (162.56 cm) Weight:      127.8 pounds (58.09 kg) BMI:     22.02 Temp:     98.1 degrees F (36.72 degrees C) oral Pulse rate:   69 / minute BP sitting:   155 / 88  (right arm)  Vitals Entered By: Wendall Mola CMA Duncan Dull) (November 27, 2009 2:36 PM) CC: complaining of three tic bites, right side, right calf and left hip/areas are still red Is Patient Diabetic? No Nutritional Status BMI of 19 -24 = normal Nutritional Status Detail appetite "no change"  Does patient need assistance? Functional Status Self care Ambulation Normal Comments pt. has been out of Lisinopril and Norvasc for one month, waiting on shipment   Physical Exam  General:  alert, well-developed, well-nourished, and well-hydrated.   Head:  normocephalic and atraumatic.   Skin:  3 small bite marks without surrounding erythema or rash  Impression & Recommendations:  Problem # 1:  TICK BITE (ICD-E906.4) appear to be benign in nature pt to call if any fever, HA or rash Orders: Est. Patient Level III (21308)  Patient Instructions: 1)  follow-up as scheduled

## 2010-09-03 NOTE — Progress Notes (Signed)
Summary: Tic bites last wk, red/raised areas now   Phone Note Call from Patient Call back at Home Phone [phone]   Caller: Patient Call For: Nurse Reason for Call: Acute Illness Summary of Call: Message left.  Pt. had 3 tic bites last week.  Places now are redden and raised.  Wondering whether he needs to be seen.  Call to pt.  Given appt. for this afternoon to evaluate these areas. Jennet Maduro RN  November 27, 2009 8:40 AM

## 2010-09-03 NOTE — Assessment & Plan Note (Signed)
Summary: fu visit/ds   CC:  Depression and follow-up appt.  History of Present Illness: [name] is in for his routine visit.  He says that there is not really been any change since his last visit.  He says that he uses his pillbox to organize his medications and as a result does not miss taking them.  He can recall only one missed dose of his HIV medications in the last 6 months.  He also uses his calendar to organize and remind himself of his medical appointments.  He feels that without the stress and distraction of working full-time he is able to cope with his loss of concentration and memory.  He says that he still has some lack of stamina and certain normal tasks that he could do in a few hours a year ago right now take a full day but this has not changed since his last visit.  He says that he really does not feel like he has much of an appetite and does not think in advance about his meals.  Sometimes he will eat only a few spoonfuls of his meal and at other times he will eat a full meal, it just depends on how he is feeling at that time.  He tells me that Dr. Leonides Cave,  to get his neuropsychiatric testing, told him that subjective loss of concentration and memory was probably due to depression.  Kavian says that he is not sure that is the case and he is a little reluctant to increase his dose of Zoloft since he had side effects when he went from 50 to 100 mg daily.  He also has heard a lot of negative things about other antidepressants.  Depression History:      The patient denies a depressed mood most of the day and a diminished interest in his usual daily activities.         Preventive Screening-Counseling & Management  Alcohol-Tobacco     Alcohol drinks/day: 0     Alcohol type: one glass of wine a week     Smoking Status: never     Passive Smoke Exposure: no  Caffeine-Diet-Exercise     Caffeine use/day: y     Does Patient Exercise: yes     Type of exercise: walking     Exercise (avg:  min/session): 30-60     Times/week: 6  Hep-HIV-STD-Contraception     HIV Risk: no risk noted  Safety-Violence-Falls     Seat Belt Use: yes  Comments: condoms declines      Sexual History:  n/a.        Drug Use:  never.     Prior Medication List:  COMBIVIR 150-300 MG TABS (LAMIVUDINE-ZIDOVUDINE) take 1 tablet by mouth twice a day VIRAMUNE 200 MG TABS (NEVIRAPINE) take 1 tablet by mouth twice a day ATENOLOL 50 MG TABS (ATENOLOL) take 1 tablet by mouth once a day LISINOPRIL-HYDROCHLOROTHIAZIDE 20-25 MG TABS (LISINOPRIL-HYDROCHLOROTHIAZIDE) Take 2 tablets by mouth once a day NORVASC 5 MG TABS (AMLODIPINE BESYLATE) Take 1 tablet by mouth once a day FISH OIL 1000 MG CAPS (OMEGA-3 FATTY ACIDS) Take 1 capsule by mouth two times a day LIPITOR 20 MG TABS (ATORVASTATIN CALCIUM) Take 1 tablet by mouth once a day PRILOSEC 20 MG CPDR (OMEPRAZOLE) take 1 capsule by mouth once a day ASPIRIN 325 MG TABS (ASPIRIN) Take 1 tablet by mouth once a day ZOLOFT 100 MG TABS (SERTRALINE HCL) Take 1/2 tablet by mouth once a day VALTREX 500 MG  TABS (VALACYCLOVIR HCL) Take 1 tablet by mouth two times a day for 5 days as needed for genital herpes   Current Allergies (reviewed today): No known allergies  Social History: Sexual History:  n/a  Vital Signs:  Patient profile:   65 year old male Height:      64 inches (162.56 cm) Weight:      126.2 pounds (57.36 kg) BMI:     21.74 Temp:     97.2 degrees F (36.22 degrees C) oral Pulse rate:   62 / minute BP sitting:   123 / 67  (left arm) Cuff size:   regular  Vitals Entered By: Jennet Maduro RN (September 03, 2009 3:35 PM) CC: Depression, follow-up appt Is Patient Diabetic? No Pain Assessment Patient in pain? no      Nutritional Status BMI of 19 -24 = normal Nutritional Status Detail appetite "I don't have an appetite but I do eat 3 times a day."  Have you ever been in a relationship where you felt threatened, hurt or afraid?No   Does patient  need assistance? Functional Status Self care Ambulation Normal Comments no missed odses of rxes    Physical Exam  General:  alert and well-nourished.  his weight is stable since his last visit Mouth:  Oral mucosa and oropharynx without lesions or exudates.  Teeth in good repair. Lungs:  normal breath sounds, no crackles, and no wheezes.   Heart:  normal rate, regular rhythm, and no murmur.   Abdomen:  soft, non-tender, normal bowel sounds, no masses, no hepatomegaly, no splenomegaly, and distended.   Skin:  no rashes.   Psych:  normally interactive, good eye contact, not anxious appearing, and not depressed appearing.     Impression & Recommendations:  Problem # 1:  HIV INFECTION (ICD-042) [name]'s HIV infection remains under good control.  I will continue his current regimen. His updated medication list for this problem includes:    Valtrex 500 Mg Tabs (Valacyclovir hcl) .Marland Kitchen... Take 1 tablet by mouth two times a day for 5 days as needed for genital herpes  Diagnostics Reviewed:  CD4: 560 (08/28/2009)   CD4 %: 44.6 (09/21/2006) WBC: 5.0 (04/03/2009)   Hgb: 12.3 (04/03/2009)   HCT: 35.1 (04/03/2009)   Platelets: 214 (04/03/2009) HIV-1 RNA: 50 (08/27/2009)   HBSAg: NEG (08/22/2008)  Orders: Est. Patient Level IV (99214)Future Orders: T-CD4SP ([facility]) (CD4SP) ... 12/02/2009 T-HIV Viral Load [phone]) ... 12/02/2009 T-Comprehensive Metabolic Panel [phone]) ... 12/02/2009 T-CBC w/Diff ([id number]) ... 12/02/2009 T-RPR (Syphilis) [phone]) ... 12/02/2009 T-Lipid Profile [id number]) ... 12/02/2009  Problem # 2:  DEPRESSION (ICD-311) see below under memory loss. His updated medication list for this problem includes:    Zoloft 100 Mg Tabs (Sertraline hcl) .Marland Kitchen... Take 1/2 tablet by mouth once a day  Orders: Est. Patient Level IV (36644)  Problem # 3:  HYPERTENSION (ICD-401.9) His blood pressure iswell controlled.  I will not make any changes today. His updated  medication list for this problem includes:    Atenolol 50 Mg Tabs (Atenolol) .Marland Kitchen... Take 1 tablet by mouth once a day    Lisinopril-hydrochlorothiazide 20-25 Mg Tabs (Lisinopril-hydrochlorothiazide) .Marland Kitchen... Take 2 tablets by mouth once a day    Norvasc 5 Mg Tabs (Amlodipine besylate) .Marland Kitchen... Take 1 tablet by mouth once a day  BP today: 123/67 Prior BP: 120/71 (06/12/2009)  Labs Reviewed: K+: 4.0 (06/12/2009) Creat: : 0.73 (06/12/2009)   Chol: 127 (06/12/2009)   HDL: 32 (06/12/2009)   LDL: 52 (06/12/2009)   TG:  215 (06/12/2009)  Orders: Est. Patient Level IV (16109)  Problem # 4:  DYSLIPIDEMIA (ICD-272.4)  I will not make any changes today. His updated medication list for this problem includes:    Lipitor 20 Mg Tabs (Atorvastatin calcium) .Marland Kitchen... Take 1 tablet by mouth once a day  Labs Reviewed: SGOT: 40 (06/12/2009)   SGPT: 36 (06/12/2009)   HDL:32 (06/12/2009), 30 (04/03/2009)  LDL:52 (06/12/2009), 47 (04/03/2009)  Chol:127 (06/12/2009), 141 (04/03/2009)  Trig:215 (06/12/2009), 318 (04/03/2009)  Orders: Est. Patient Level IV (60454)  Problem # 5:  MEMORY LOSS (ICD-780.93) I am not entirely certain what has caused [name] subjective memory loss and difficulty with concentration over the last year.  The neuropsychiatric evaluation did not reveal any other potential causes and it was Dr. Maxwell Marion  conclusion that depression might be the major underlying cause.  All the gym he does not seem to be expressing typical symptoms of clinical depression now I did talk to him about the possible option of increasing Zoloft or switching to another regimen and he says he will consider this.  His memory loss and concentration do seem to have been stabilized by stopping work. Orders: Est. Patient Level IV (09811)  Patient Instructions: 1)  Please schedule a follow-up appointment in 3 months.  Process Orders Check Orders Results:     [facility]: ABN not required for this insurance Tests  Sent for requisitioning (September 03, 2009 5:23 PM):     12/02/2009: [facility] -- T-HIV Viral Load [phone] (signed)     12/02/2009: [facility] -- T-Comprehensive Metabolic Panel [[id number]] (signed)     12/02/2009: [facility] -- T-CBC w/Diff [[id number]] (signed)     12/02/2009: [facility] -- T-RPR (Syphilis) [phone] (signed)     12/02/2009: [facility] -- T-Lipid Profile [id number] (signed)

## 2010-09-03 NOTE — Progress Notes (Signed)
Summary: Lab appt. rescheduled, message left  Phone Note Call from Patient Call back at Home Phone [phone]   Caller: Patient Reason for Call: Talk to Nurse Summary of Call: Message left by pt. to call him back.  Return phone call, no answer.  RN left message that pt. missed lab appt. and that pt. was scheduled for Mon., Jan 45 @ 2:30 for labs.  Jennet Maduro RN  August 24, 2009 3:03 PM

## 2010-09-03 NOTE — Progress Notes (Signed)
Summary: refill/mld  Phone Note Call from Patient   Caller: Patient Reason for Call: Refill Medication Summary of Call: Patient called to let me know that [facility] did not transfer his prescriptions per [facility].  Patient would like his prescriptions to be called in for him.  He gave me the [facility] fax number which is [phone] (ADAP) Initial call taken by: Paulo Fruit  BS,CPht II,MPH,  January 07, 2010 12:15 PM    Prescriptions: PRILOSEC 20 MG CPDR (OMEPRAZOLE) take 1 capsule by mouth once a day  #30 x 9   Entered by:   Paulo Fruit  BS,CPht II,MPH   Authorized by:   Cliffton Asters MD   Signed by:   Paulo Fruit  BS,CPht II,MPH on 01/07/2010   Method used:   Electronically to        [facility] [phone]* (retail)       [address]       [place], [place]  [address]       Ph: [phone]       Fax:    RxID:   [id number] VIRAMUNE 200 MG TABS (NEVIRAPINE) take 1 tablet by mouth twice a day  #60 x 9   Entered by:   Paulo Fruit  BS,CPht II,MPH   Authorized by:   Cliffton Asters MD   Signed by:   Paulo Fruit  BS,CPht II,MPH on 01/07/2010   Method used:   Electronically to        [facility] [phone]* (retail)       [address]       [place], [place]  [address]       Ph: [phone]       Fax:    RxID:   [id number] COMBIVIR 150-300 MG TABS (LAMIVUDINE-ZIDOVUDINE) take 1 tablet by mouth twice a day  #60 x 9   Entered by:   Paulo Fruit  BS,CPht II,MPH   Authorized by:   Cliffton Asters MD   Signed by:   Paulo Fruit  BS,CPht II,MPH on 01/07/2010   Method used:   Electronically to        [facility] [phone]* (retail)       [address]       [place], [place]  [address]       Ph: [phone]       Fax:    RxID:   [id number]  Paulo Fruit  BS,CPht II,MPH  January 07, 2010 12:18 PM

## 2010-09-03 NOTE — Progress Notes (Signed)
Summary: PAP rxes arrived.  Phone Note Outgoing Call   Call placed by: Jennet Maduro RN,  June 11, 2010 5:02 PM Call placed to: Patient Action Taken: Assistance medications ready for pick up Summary of Call: PAP received.  Pt. notified. Jennet Maduro RN  June 11, 2010 5:02 PM     Prescriptions: ZOLOFT 100 MG TABS (SERTRALINE HCL) Take 3/4 tablet by mouth once a day  #90 x 0   Entered by:   Jennet Maduro RN   Authorized by:   Cliffton Asters MD   Signed by:   Jennet Maduro RN on 06/11/2010   Method used:   Samples Given   RxID:   [id number] LIPITOR 20 MG TABS (ATORVASTATIN CALCIUM) Take 1 tablet by mouth once a day  #90 x 0   Entered by:   Jennet Maduro RN   Authorized by:   Cliffton Asters MD   Signed by:   Jennet Maduro RN on 06/11/2010   Method used:   Samples Given   RxID:   [id number] NORVASC 5 MG TABS (AMLODIPINE BESYLATE) Take 1 tablet by mouth once a day  #90 x 0   Entered by:   Jennet Maduro RN   Authorized by:   Cliffton Asters MD   Signed by:   Jennet Maduro RN on 06/11/2010   Method used:   Samples Given   RxID:   [id number]  Zoloft 100 mg tablets #90 Lot # [id number] Exp 03/2013  Lipitor #90 Lot [id number] exp 03/2013   Norvasc #90 Lot # [id number] exp 12/2014

## 2010-09-05 NOTE — Progress Notes (Signed)
Summary: PAP meds arrived  Phone Note Outgoing Call   Call placed by: Jennet Maduro RN,  August 14, 2010 11:15 AM Call placed to: Patient Action Taken: Assistance medications ready for pick up Summary of Call: Pt. notified.  Jennet Maduro RN  August 14, 2010 11:18 AM     Prescriptions: ZOLOFT 100 MG TABS (SERTRALINE HCL) Take 3/4 tablet by mouth once a day  #60 x 0   Entered by:   Jennet Maduro RN   Authorized by:   Cliffton Asters MD   Signed by:   Jennet Maduro RN on 08/14/2010   Method used:   Samples Given   RxID:   [id number] LIPITOR 20 MG TABS (ATORVASTATIN CALCIUM) Take 1 tablet by mouth once a day  #90 x 0   Entered by:   Jennet Maduro RN   Authorized by:   Cliffton Asters MD   Signed by:   Jennet Maduro RN on 08/14/2010   Method used:   Samples Given   RxID:   [id number] NORVASC 5 MG TABS (AMLODIPINE BESYLATE) Take 1 tablet by mouth once a day  #90 x 0   Entered by:   Jennet Maduro RN   Authorized by:   Cliffton Asters MD   Signed by:   Jennet Maduro RN on 08/14/2010   Method used:   Samples Given   RxID:   [id number]  Zoloft 100mg  Lot # [id number] Exp. date 10/14  Norvasc 5 mg Lot # [id number] Exp. 05/16  Lipitor 20 mg Lot # [id number] Exp. 10/14  Appended Document: PAP meds arrived pt. picked up ADAP meds  Appended Document: PAP meds arrived correction, pt. picked up PA med. of Zoloft

## 2010-09-05 NOTE — Initial Assessments (Signed)
INTERNAL MEDICINE ADMISSION HISTORY AND PHYSICAL  Admission Date: 07/19/2010 Attending Physician: Dr. Blanch Media First Contact: Dr Dorthula Rue [phone] Second contact: Dr Gwenlyn Perking [address] [place], or after 5pm Weekdays:  1st Contact: [phone] 2nd Contact: [phone]   PCP: Dr Cliffton Asters  GN:FAOZHYQ speech  MVH:QION is 65 year old male with PMH significant for CAD, s/p CABG , HTN, DM, HIV  who presented to ED with chief complain of slurred speech. On Wednesday patient went for a walk when he noticed blurred vision and felling weak all over. This lasted for about 40 min. It improved significantly but never resolved completly. He repots of some headache that evening.  The next day morning while brushing his teeth he noticed that he was not able to spit out the toothpaste and had a numb feeling on the upper and lower lip on both sides . This improved but then he developed some slurred speech in the afternoon which has become progressively worth therefore he went to the ED. Pt further noted one episode of chest pain, substernal, sharp, nonradiating which occured suddenly and lastet only for one min. No aggravating or alleviating factor.  ED physician noticed that the slurred speech was  getting worth. Patient reports that he had episodes of blurred vision of short duration in the past but never had weakness. Pt denies any further episode of headache, dizziness,  weakness anywhere else, jerking movements, chest, SOB, abdominal pain, changes in bowel movement.   ALLERGIES: NKDA  PAST MEDICAL HISTORY:  Hypertension Coronary artery disease, s/p 6 vessel CABG in 2003.  Echo 12/2009: Left ventricle: The cavity size was normal. Wall thickness was increased in a pattern of mild LVH. The estimated ejection fraction was 55%. Atrial septum: No defect or patent foramen ovale was identified..Myoview in 07/2008 was not able to complete because of fatigue of pt. No signs of ischemia  noted.Last cath in 2003. Followed by [facility]  Diabetes mellitus, type II Hgb A1c 5.2, currently on no medication GERD HIV disease -CD 4 count 490 (07/03/2010) and viral load <20 (07/02/2010). Followed by Dr Jonny Ruiz Campbell/ID Hyperlipidemia  chronic right hearing loss - 100% - per ENT Nephrolithiasis, hx of Peripheral vascular disease Depression   MEDICATIONS: Current Meds:  COMBIVIR 150-300 MG TABS (LAMIVUDINE-ZIDOVUDINE) take 1 tablet by mouth twice a day VIRAMUNE 200 MG TABS (NEVIRAPINE) take 1 tablet by mouth twice a day ATENOLOL 50 MG TABS (ATENOLOL) take 1 tablet by mouth once a day LISINOPRIL-HYDROCHLOROTHIAZIDE 20-25 MG TABS (LISINOPRIL-HYDROCHLOROTHIAZIDE) Take 2 tablets by mouth once a day NORVASC 5 MG TABS (AMLODIPINE BESYLATE) Take 1 tablet by mouth once a day FISH OIL 1000 MG CAPS (OMEGA-3 FATTY ACIDS) Take 1 capsule by mouth two times a day GEMFIBROZIL 600 MG TABS (GEMFIBROZIL) Take 1 tablet by mouth two times a day LIPITOR 20 MG TABS (ATORVASTATIN CALCIUM) Take 1 tablet by mouth once a day PRILOSEC 20 MG CPDR (OMEPRAZOLE) take 1 capsule by mouth once a day ASPIRIN 325 MG TABS (ASPIRIN) Take 1 tablet by mouth once a day ZOLOFT 100 MG TABS (SERTRALINE HCL) Take 3/4 tablet by mouth once a day VALTREX 500 MG TABS (VALACYCLOVIR HCL) Take 1 tablet by mouth two times a day for 5 days as needed for genital herpes   SOCIAL HISTORY: Social History: work - Curator for [employer] divorced 4 children Never Smoked Alcohol use-yes No illicit drug use   FAMILY HISTORY Family History: father with heart disease, arthritis, MI/CAD - died at mid 3's  mother alive at 67 yo - hx of TIA at 83 yo, DM, arthritis 2 aunts with cancer - one with breast, one with lung cancer 2 brother with CABG/CAD 3 brother with HTN 1 brother with DM 1 sister with stroke, and heart disease  2 aunts with dementia   ROS:Pt denies any further episode of headache, dizziness,   weakness anywhere else, jerking movements, chest pain, SOB, abdominal pain, changes in bowel movement.   VITALS: T:  98.3 P:100  BP:1500/84  R: 16 O2SAT: 99% ON:RA  PHYSICAL EXAM: Gen: Patient is in NAD, Pleasant. Eyes: PERRL, EOMI, No signs of anemia or jaundice. ENT: MMM, OP clear, No erythema, thrush or exudates. Neck: Supple, No carotid Bruits, No JVD, No thyromegaly Resp: CTA- Bilaterally, No W/C/R. CVS: RRR, systolic murmur on the left 2n intercostal region GI: Abdomen is soft. ND, NT, NG, NR, BS+. No organomegaly. Ext: No pedal edema, cyanosis or clubbing. GU: No CVA tenderness. Skin: No visible rashes, scars.Dry.  Lymph: No palpable lymphadenopathy. MS: Moving all 4 extremities. Neuro: A&O X3, CN II - XII are grossly intact except  CN VII with drooping left sided face and without trouble  to wrinkle the forehand. Motor strength is 5/5 in the all 4 extremities, Sensations intact to light touch, Gait normal, Cerebellar signs negative. Romberg negative.   Psych: Appropriate  LABS: Sodium (NA)                              145               135-145          mEq/L  Potassium (K)                            3.6               3.5-5.1          mEq/L  Chloride                                 104               96-112           mEq/L  CO2                                      24                19-32            mEq/L  Glucose                                  146        h      70-99            mg/dL  BUN                                      19                6-23  mg/dL  Creatinine                               .8                0.4-1.5          mg/dL  GFR, Est Non African American            >60               >60              mL/min  GFR, Est African American                >60               >60              mL/min    Oversized comment, see footnote  1  Bilirubin, Total                         0.6               0.3-1.2          mg/dL  Alkaline Phosphatase                     91                 39-117           U/L  SGOT (AST)                               34                0-37             U/L  SGPT (ALT)                               31                0-53             U/L  Total  Protein                           7.6               6.0-8.3          g/dL  Albumin-Blood                            4.6               3.5-5.2          g/dL  Calcium                                  9.2               8.4-10.5         mg/dL  WBC  5.6               4.0-10.5         K/uL  RBC                                      3.42       l      4.22-5.81        MIL/uL  Hemoglobin (HGB)                         14.3              13.0-17.0        g/dL  Hematocrit (HCT)                         38.0       l      39.0-52.0        %  MCV                                      111.1      h      78.0-100.0       fL  MCH -                                    41.8       h      26.0-34.0        pg  MCHC                                     37.6       h      30.0-36.0        g/dL  RDW                                      12.7              11.5-15.5        %  Platelet Count (PLT)                     156               150-400          K/uL  Neutrophils, %                           55                43-77            %  Lymphocytes, %                           29                12-46            %  Monocytes, %  12                3-12             %  Eosinophils, %                           4                 0-5              %  Basophils, %                             0                 0-1              %  Neutrophils, Absolute                    3.1               1.7-7.7          K/uL  Lymphocytes, Absolute                    1.6               0.7-4.0          K/uL  Monocytes, Absolute                      0.7               0.1-1.0          K/uL  Eosinophils, Absolute                    0.2               0.0-0.7          K/uL  Basophils, Absolute                       0.0               0.0-0.1          K/uL  CKMB, POC                                6.1               1.0-8.0          ng/mL  Troponin I, POC                          <0.05             0.00-0.09        ng/mL  Myoglobin, POC                           64.0              12-200           ng/mL  PTT(a-Partial Thromboplastn Time)        32                24-37  seconds  Protime ( Prothrombin Time)              13.0              11.6-15.2        seconds  INR                                      0.96              0.00-1.49  Color, Urine                             YELLOW            YELLOW  Appearance                               CLEAR             CLEAR  Specific Gravity                         1.020             1.005-1.030  pH                                       6.0               5.0-8.0  Urine Glucose                            NEGATIVE          NEG              mg/dL  Bilirubin                                NEGATIVE          NEG  Ketones                                  NEGATIVE          NEG              mg/dL  Blood                                    NEGATIVE          NEG  Protein                                  NEGATIVE          NEG              mg/dL  Urobilinogen                             0.2               0.0-1.0  mg/dL  Nitrite                                  NEGATIVE          NEG  Leukocytes                               NEGATIVE          NEG  CXR: Findings: The lungs are well-aerated and clear.  There is no   evidence of focal opacification, pleural effusion or pneumothorax.    The heart is normal in size; the patient is status post median   sternotomy, with evidence of prior CABG.  No acute osseous   abnormalities are seen.    IMPRESSION:   No acute cardiopulmonary process seen.   CT Head without contrast:  Findings: The brain has a normal appearance without evidence for   hemorrhage, infarction, hydrocephalus, or mass lesion.     There  is no extra axial fluid collection.    The mastoid air cells are clear.    There is partial opacification of the ethmoid air cells.    Mucosal thickening involves the sphenoid sinus.    IMPRESSION:    1.  No acute intracranial abnormalities.   2.  Sinus inflammation.  ASSESSMENT AND PLAN: 1. Blurry vision and slurred speech with facial droop: At this point it appears that this patient may had an acute stroke given the relatively sudden onset of blurry vision, slurred speech, and facial droop. This occurred/started >24 hours before the patient presented to the hospital, putting this patient outside the window for thrombolytic therapy. Non-contrast CT of the head is negative for acute bleed although it does not show acute stroke.Patient has significant risk factors including  HLD, DM  and CAD.  DD include TIA,  Bells Palsy which presents with sudden onset of facial paralysis but pt was able to wrinkle his forehand  which would be not present in peripheral palsy furthermore he would have trouble closing his eyes, trouble raising his eyebrows and  may have hyperakusis which was not present. Furthermore DD include Trigeminal neuralgia  in setting of sudden blurry vision, headache but unlikely. - Patient passes swallow evaluation. Patient is ambulatory and does need any PT at this time - Admitted to Telemetry - Will check CEx3, FLP, UDS, TSH, Hgb A1c  - Will get 2DEcho, carotid Dopplers, MRI without contrast and MRA with and without contrast - Will continue Aspirin for now but if any imagining study documents stroke then it means patient failed Aspirin and need to be started on Plavix , started pt on Crestor- may consider to change to home medication Lipitor and Gemfibrozil  2. HLD: will check FLP. started on Crestor 3. DM, Type 2. Last HgbA1c in June was 5.2. Pt was not taking any medication at home. Will check HgbA1c and will cover with SSI sensitive 4. Anemia, baseline between 11.5 -12.5 with low  Hct and elevated MCV. Will check Folate and Vitamin B12 since HIV medication can cause especially folate deficiency. 5. HIV: CD 4 count 490 (07/03/2010) and viral load <20 (07/02/2010).Will continue on home medication. 6. Depression: will continue on home medication Zoloft 7. DVT ppx: Lovenox

## 2010-09-05 NOTE — Assessment & Plan Note (Signed)
Summary: [name]   CC:  hospital followup and Bells Palsy.  History of Present Illness: [name] noted left-sided facial weakness when he woke up one week ago.  Later that evening he became concerned and went to the [facility] emergency department which led to his admission to the hospital.  It was determined that he had Bell's palsy and not a CVA.  He was treated with a prednisone taper and Valtrex which he will complete in a few days.  He has noted some improvement in the left-sided facial weakness since discharge.  Depression History:      The patient denies a depressed mood most of the day and a diminished interest in his usual daily activities.        The patient denies that he feels like life is not worth living, denies that he wishes that he were dead, and denies that he has thought about ending his life.        Preventive Screening-Counseling & Management  Alcohol-Tobacco     Alcohol drinks/day: 0     Alcohol type: one glass of wine a week     Smoking Status: never     Passive Smoke Exposure: no  Caffeine-Diet-Exercise     Caffeine use/day: rarely     Does Patient Exercise: yes     Type of exercise: walking     Exercise (avg: min/session): 30-60     Times/week: 6  Hep-HIV-STD-Contraception     HIV Risk: no risk noted     HIV Risk Counseling: not indicated-no HIV risk noted  Safety-Violence-Falls     Seat Belt Use: yes      Sexual History:  n/a.        Drug Use:  never.     Prior Medication List:  COMBIVIR 150-300 MG TABS (LAMIVUDINE-ZIDOVUDINE) take 1 tablet by mouth twice a day VIRAMUNE 200 MG TABS (NEVIRAPINE) take 1 tablet by mouth twice a day ATENOLOL 50 MG TABS (ATENOLOL) take 1 tablet by mouth once a day LISINOPRIL-HYDROCHLOROTHIAZIDE 20-25 MG TABS (LISINOPRIL-HYDROCHLOROTHIAZIDE) Take 2 tablets by mouth once a day NORVASC 5 MG TABS (AMLODIPINE BESYLATE) Take 1 tablet by mouth once a day FISH OIL 1000 MG CAPS (OMEGA-3 FATTY ACIDS) Take 1 capsule by mouth two times  a day GEMFIBROZIL 600 MG TABS (GEMFIBROZIL) Take 1 tablet by mouth two times a day LIPITOR 20 MG TABS (ATORVASTATIN CALCIUM) Take 1 tablet by mouth once a day PRILOSEC 20 MG CPDR (OMEPRAZOLE) take 1 capsule by mouth once a day ASPIRIN 325 MG TABS (ASPIRIN) Take 1 tablet by mouth once a day ZOLOFT 100 MG TABS (SERTRALINE HCL) Take 3/4 tablet by mouth once a day VALTREX 500 MG TABS (VALACYCLOVIR HCL) Take 1 tablet by mouth two times a day for 5 days as needed for genital herpes PREDNISONE 20 MG TABS (PREDNISONE) Take three tablets by mouth daily for one week VALACYCLOVIR HCL 1 GM TABS (VALACYCLOVIR HCL) Take one tablet three times daily for one week   Current Allergies (reviewed today): No known allergies  Vital Signs:  Patient profile:   65 year old male Height:      64 inches (162.56 cm) Weight:      125.0 pounds (56.82 kg) BMI:     21.53 Temp:     98.0 degrees F (36.67 degrees C) oral Pulse rate:   66 / minute BP sitting:   142 / 85  (left arm)  Vitals Entered By: Wendall Mola CMA Duncan Dull) (July 25, 2010 8:50  AM) CC: hospital followup, Bells Palsy Is Patient Diabetic? No Pain Assessment Patient in pain? no      Nutritional Status BMI of 19 -24 = normal Nutritional Status Detail appetite "not good"  Have you ever been in a relationship where you felt threatened, hurt or afraid?No   Does patient need assistance? Functional Status Self care Ambulation Normal Comments no missed doses of meds per pt.   Physical Exam  General:  alert and well-nourished.   Eyes:  corneas and lenses clear and no injection.   Neurologic:  he has a left peripheral cranial nerve 7 palsy.  He has no weakness of his arms or legs. gait normal.   Psych:  normally interactive, good eye contact, not anxious appearing, and not depressed appearing.     Impression & Recommendations:  Problem # 1:  BELLS PALSY (ICD-351.0) He has idiopathic Bell's palsy which has improved slightly.  He will  complete his prednisone taper and Valtrex.  I've instructed him to use artificial tears frequently during the day and a left eye patch at night. Orders: Est. Patient Level III (04540)  Patient Instructions: 1)  Please schedule a follow-up appointment in 6 weeks.

## 2010-09-05 NOTE — Miscellaneous (Signed)
Summary: [employer]  [employer]   Imported By: Florinda Marker 08/19/2010 09:24:53  _____________________________________________________________________  External Attachment:    Type:   Image     Comment:   External Document

## 2010-09-05 NOTE — Letter (Signed)
Summary: [employer]:   [employer]:   Imported By: Florinda Marker 07/25/2010 10:30:19  _____________________________________________________________________  External Attachment:    Type:   Image     Comment:   External Document

## 2010-09-05 NOTE — Progress Notes (Signed)
Summary: PAP rxes reordered for the pt.   Phone Note Call from Patient Call back at [facility] Phone [phone]   Caller: Patient Reason for Call: Refill Medication Summary of Call: Pt. called to request PAP rxes to be reordered.  RN called, reordered Norvasc, Lipitor and Zoloft through PAP program.  Will be delivered in approzimately 2 weeks from 08/02/2010. Jennet Maduro RN  July 31, 2010 5:42 PM

## 2010-09-05 NOTE — Discharge Summary (Signed)
Summary: Hospital Discharge Update    Hospital Discharge Update:  Date of Admission: 07/18/2010 Date of Discharge: 07/19/2010  Brief Summary:  65yo M with CAD s/p CABG, HIV (CD4 490, VL<20), mild DM2 presented to the ED after waking up with L-sided facial droop, slurred speech, and blurry vision. On presentation, it was unclear if forehead was involved in L-sided facial weakness. He was admitted to rule out acute stroke vs. Bell's palsy. CT of head and head MRI were largely negative except for sinus inflammation and evidence of small vessel disease. He was discharged on a one week course of prednisone and valacyclovir for treatment of Bell's palsy.  Problem list changes:  Added new problem of BELLS PALSY (ICD-351.0)  Medication list changes:  Added new medication of PREDNISONE 20 MG TABS (PREDNISONE) Take three tablets by mouth daily for one week - Signed Added new medication of VALACYCLOVIR HCL 1 GM TABS (VALACYCLOVIR HCL) Take one tablet three times daily for one week - Signed Rx of PREDNISONE 20 MG TABS (PREDNISONE) Take three tablets by mouth daily for one week;  #21 x 0;  Signed;  Entered by: Whitney Post MD;  Authorized by: Whitney Post MD;  Method used: Print then Give to Patient Rx of VALACYCLOVIR HCL 1 GM TABS (VALACYCLOVIR HCL) Take one tablet three times daily for one week;  #21 x 0;  Signed;  Entered by: Whitney Post MD;  Authorized by: Whitney Post MD;  Method used: Print then Give to Patient Rx of PREDNISONE 20 MG TABS (PREDNISONE) Take three tablets by mouth daily for one week;  #21 x 0;  Signed;  Entered by: Whitney Post MD;  Authorized by: Whitney Post MD;  Method used: Electronically to [facility]*, [address] [place], [place]  [address], Ph: [phone], Fax: [phone] Rx of VALACYCLOVIR HCL 1 GM TABS (VALACYCLOVIR HCL) Take one tablet three times daily for one week;  #21 x 0;  Signed;  Entered by: Whitney Post MD;  Authorized by: Whitney Post MD;  Method used:  Electronically to [facility]*, [address] [place], [place]  [address], Ph: [phone], Fax: [phone]  The medication, problem, and allergy lists have been updated.  Please see the dictated discharge summary for details.  Discharge medications:  COMBIVIR 150-300 MG TABS (LAMIVUDINE-ZIDOVUDINE) take 1 tablet by mouth twice a day VIRAMUNE 200 MG TABS (NEVIRAPINE) take 1 tablet by mouth twice a day ATENOLOL 50 MG TABS (ATENOLOL) take 1 tablet by mouth once a day LISINOPRIL-HYDROCHLOROTHIAZIDE 20-25 MG TABS (LISINOPRIL-HYDROCHLOROTHIAZIDE) Take 2 tablets by mouth once a day NORVASC 5 MG TABS (AMLODIPINE BESYLATE) Take 1 tablet by mouth once a day FISH OIL 1000 MG CAPS (OMEGA-3 FATTY ACIDS) Take 1 capsule by mouth two times a day GEMFIBROZIL 600 MG TABS (GEMFIBROZIL) Take 1 tablet by mouth two times a day LIPITOR 20 MG TABS (ATORVASTATIN CALCIUM) Take 1 tablet by mouth once a day PRILOSEC 20 MG CPDR (OMEPRAZOLE) take 1 capsule by mouth once a day ASPIRIN 325 MG TABS (ASPIRIN) Take 1 tablet by mouth once a day ZOLOFT 100 MG TABS (SERTRALINE HCL) Take 3/4 tablet by mouth once a day VALTREX 500 MG TABS (VALACYCLOVIR HCL) Take 1 tablet by mouth two times a day for 5 days as needed for genital herpes PREDNISONE 20 MG TABS (PREDNISONE) Take three tablets by mouth daily for one week VALACYCLOVIR HCL 1 GM TABS (VALACYCLOVIR HCL) Take one tablet three times daily for one week  Other patient instructions:  Take prednisone and valacyclovir as  instructed for one week for treatment of Bell's palsy. Please follow-up with Dr. Orvan Falconer on Thursday December 22nd at 9:30am in the Infectious Disease Clinic.  Note: Hospital Discharge Medications & Other Instructions handout was printed, one copy for patient and a second copy to be placed in hospital chart.  Prescriptions: VALACYCLOVIR HCL 1 GM TABS (VALACYCLOVIR HCL) Take one tablet three times daily for one week  #21 x 0   Entered and Authorized by:    Whitney Post MD   Signed by:   Whitney Post MD on 07/19/2010   Method used:   Electronically to        [facility]* (retail)       [address] [place], [place]  [address]       Ph: [phone]       Fax: [phone]   RxID:   [id number] PREDNISONE 20 MG TABS (PREDNISONE) Take three tablets by mouth daily for one week  #21 x 0   Entered and Authorized by:   Whitney Post MD   Signed by:   Whitney Post MD on 07/19/2010   Method used:   Electronically to        [facility]* (retail)       [address] [place], [place]  [address]       Ph: [phone]       Fax: [phone]   RxID:   [id number] VALACYCLOVIR HCL 1 GM TABS (VALACYCLOVIR HCL) Take one tablet three times daily for one week  #21 x 0   Entered and Authorized by:   Whitney Post MD   Signed by:   Whitney Post MD on 07/19/2010   Method used:   Print then Give to Patient   RxID:   [id number] PREDNISONE 20 MG TABS (PREDNISONE) Take three tablets by mouth daily for one week  #21 x 0   Entered and Authorized by:   Whitney Post MD   Signed by:   Whitney Post MD on 07/19/2010   Method used:   Print then Give to Patient   RxID:   [id number]

## 2010-09-10 ENCOUNTER — Encounter: Payer: Self-pay | Admitting: Internal Medicine

## 2010-09-10 ENCOUNTER — Ambulatory Visit (INDEPENDENT_AMBULATORY_CARE_PROVIDER_SITE_OTHER): Payer: Self-pay | Admitting: Internal Medicine

## 2010-09-10 ENCOUNTER — Ambulatory Visit (INDEPENDENT_AMBULATORY_CARE_PROVIDER_SITE_OTHER): Payer: Self-pay

## 2010-09-10 DIAGNOSIS — G510 Bell's palsy: Secondary | ICD-10-CM

## 2010-09-10 DIAGNOSIS — F329 Major depressive disorder, single episode, unspecified: Secondary | ICD-10-CM

## 2010-09-10 DIAGNOSIS — B20 Human immunodeficiency virus [HIV] disease: Secondary | ICD-10-CM

## 2010-09-10 DIAGNOSIS — I10 Essential (primary) hypertension: Secondary | ICD-10-CM

## 2010-09-10 LAB — CONVERTED CEMR LAB
AST: 25 units/L (ref 0–37)
Albumin: 4.7 g/dL (ref 3.5–5.2)
BUN: 14 mg/dL (ref 6–23)
CO2: 25 meq/L (ref 19–32)
Calcium: 9.5 mg/dL (ref 8.4–10.5)
Chloride: 104 meq/L (ref 96–112)
Cholesterol: 123 mg/dL (ref 0–200)
Creatinine, Ser: 0.69 mg/dL (ref 0.40–1.50)
Glucose, Bld: 139 mg/dL — ABNORMAL HIGH (ref 70–99)
HDL: 32 mg/dL — ABNORMAL LOW (ref >39)
HIV 1 RNA Quant: 39 copies/mL
Potassium: 5 meq/L (ref 3.5–5.3)
Total CHOL/HDL Ratio: 3.8
Triglycerides: 327 mg/dL — ABNORMAL HIGH (ref <150)

## 2010-09-18 ENCOUNTER — Encounter: Payer: Self-pay | Admitting: Internal Medicine

## 2010-09-19 NOTE — Assessment & Plan Note (Signed)
Summary: 6wks f/u (mkj)   Vital Signs:  Patient profile:   65 year old male Height:      64 inches (162.56 cm) Weight:      125.75 pounds (57.16 kg) BMI:     21.66 Temp:     97.4 degrees F (36.33 degrees C) oral Pulse rate:   56 / minute BP sitting:   145 / 82  (left arm) Cuff size:   regular  Vitals Entered By: Jennet Maduro RN (September 10, 2010 9:41 AM) CC: follow-up visit, Bell's palsy is slowly improving, left eye lid is still a problem but smile is symmetrical smile, taking Tylenol Arthritis twice a day Is Patient Diabetic? No Pain Assessment Patient in pain? yes     Location: bilateral hips Intensity: 2 Type: aching Nutritional Status BMI of 19 -24 = normal Nutritional Status Detail appetite   Have you ever been in a relationship where you felt threatened, hurt or afraid?No   Does patient need assistance? Functional Status Self care Ambulation Normal Comments no missed doses   CC:  follow-up visit, Bell's palsy is slowly improving, left eye lid is still a problem but smile is symmetrical smile, and taking Tylenol Arthritis twice a day.  History of Present Illness: Perlie is in for his routine visit.  He feels that he has almost fully recovered from his Bell's palsy.  He still notes a little wheeziness of his left eyelid and has to be careful when chewing his food so that he does not buy the inside of his cheek but otherwise he feels like he is fully recovered.  He denies missing a single dose of his HIV medications.  He has reduced his Zoloft to 50 mg daily and does not feel depressed.  He is currently not sexually active.  Preventive Screening-Counseling & Management  Alcohol-Tobacco     Alcohol drinks/day: 0     Alcohol type: one glass of wine a week     Smoking Status: never     Passive Smoke Exposure: no  Caffeine-Diet-Exercise     Caffeine use/day: rarely     Does Patient Exercise: yes     Type of exercise: walking     Exercise (avg: min/session):  30-60     Times/week: 6  Hep-HIV-STD-Contraception     HIV Risk: no risk noted     HIV Risk Counseling: not indicated-no HIV risk noted  Safety-Violence-Falls     Seat Belt Use: yes  Comments: declined condoms      Sexual History:  n/a.        Drug Use:  never.    Current Medications (verified): 1)  Combivir 150-300 Mg Tabs (Lamivudine-Zidovudine) .... Take 1 Tablet By Mouth Twice A Day 2)  Viramune 200 Mg Tabs (Nevirapine) .... Take 1 Tablet By Mouth Twice A Day 3)  Atenolol 50 Mg Tabs (Atenolol) .... Take 1 Tablet By Mouth Once A Day 4)  Lisinopril-Hydrochlorothiazide 20-25 Mg Tabs (Lisinopril-Hydrochlorothiazide) .... Take 2 Tablets By Mouth Once A Day 5)  Norvasc 5 Mg Tabs (Amlodipine Besylate) .... Take 1 Tablet By Mouth Once A Day 6)  Gemfibrozil 600 Mg Tabs (Gemfibrozil) .... Take 1 Tablet By Mouth Two Times A Day 7)  Lipitor 20 Mg Tabs (Atorvastatin Calcium) .... Take 1 Tablet By Mouth Once A Day 8)  Prilosec 20 Mg Cpdr (Omeprazole) .... Take 1 Capsule By Mouth Once A Day 9)  Aspirin 325 Mg Tabs (Aspirin) .... Take 1 Tablet By Mouth Once A Day  10)  Zoloft 100 Mg Tabs (Sertraline Hcl) .... Take 1/2 Tablet By Mouth Once A Day 11)  Valtrex 500 Mg Tabs (Valacyclovir Hcl) .... Take 1 Tablet By Mouth Two Times A Day For 5 Days As Needed For Genital Herpes  Allergies: No Known Drug Allergies  Physical Exam  General:  alert and well-nourished.   Mouth:  Oral mucosa and oropharynx without lesions or exudates.  Teeth in good repair. Lungs:  normal breath sounds, no crackles, and no wheezes.   Heart:  normal rate, regular rhythm, and no murmur.   Neurologic:  cranial nerves II-XII intact.   Skin:  no rashes.   Psych:  normally interactive, good eye contact, not anxious appearing, and not depressed appearing.          Medication Adherence: 09/10/2010   Adherence to medications reviewed with patient. Counseling to provide adequate adherence provided   Prevention For  Positives: 09/10/2010   Safe sex practices discussed with patient. Condoms offered.                             Impression & Recommendations:  Problem # 1:  BELLS PALSY (ICD-351.0) He has had a relatively rapid and nearly complete recovery. Orders: Est. Patient Level IV (16109)  Problem # 2:  HIV INFECTION (ICD-042) His adherence remains excellent.  I will continue his current regimen and check lab work today. The following medications were removed from the medication list:    Valacyclovir Hcl 1 Gm Tabs (Valacyclovir hcl) .Marland Kitchen... Take one tablet three times daily for one week His updated medication list for this problem includes:    Valtrex 500 Mg Tabs (Valacyclovir hcl) .Marland Kitchen... Take 1 tablet by mouth two times a day for 5 days as needed for genital herpes  Orders: Est. Patient Level IV (99214)Future Orders: T-CD4SP ([facility]) (CD4SP) ... 03/09/2011 T-HIV Viral Load [id number]) ... 03/09/2011 T-Comprehensive Metabolic Panel [phone]) ... 03/09/2011  Problem # 3:  HYPERTENSION (ICD-401.9) His blood pressure is slightly elevated today.  I will continue his current regimen and have him check his blood pressure between now and his next visit. His updated medication list for this problem includes:    Atenolol 50 Mg Tabs (Atenolol) .Marland Kitchen... Take 1 tablet by mouth once a day    Lisinopril-hydrochlorothiazide 20-25 Mg Tabs (Lisinopril-hydrochlorothiazide) .Marland Kitchen... Take 2 tablets by mouth once a day    Norvasc 5 Mg Tabs (Amlodipine besylate) .Marland Kitchen... Take 1 tablet by mouth once a day  Orders: Est. Patient Level IV (99214)  BP today: 145/82 Prior BP: 142/85 (07/25/2010)  Labs Reviewed: K+: 4.3 (07/02/2010) Creat: : 0.70 (07/02/2010)   Chol: 149 (07/02/2010)   HDL: 28 (07/02/2010)   LDL: See Comment mg/dL (13/03/6577)   TG: 469 (07/02/2010)  Problem # 4:  DEPRESSION (ICD-311) I will continue Zoloft. His updated medication list for this problem includes:    Zoloft 100 Mg Tabs  (Sertraline hcl) .Marland Kitchen... Take 1/2 tablet by mouth once a day  Orders: Est. Patient Level IV (62952)  Medications Added to Medication List This Visit: 1)  Zoloft 100 Mg Tabs (Sertraline hcl) .... Take 1/2 tablet by mouth once a day  Patient Instructions: 1)  Please schedule a follow-up appointment in 6 months.    Orders Added: 1)  Est. Patient Level IV [84132] 2)  T-CD4SP [facility]) [CD4SP] 3)  T-HIV Viral Load [phone] 4)  T-Comprehensive Metabolic Panel [[id number]]

## 2010-09-19 NOTE — Assessment & Plan Note (Signed)
Summary: STUDY   Allergies: No Known Drug Allergies   Other Orders: Est. Patient Research Study [phone]) T-Comprehensive Metabolic Panel [phone]) T-Lipid Profile [id number])   Orders Added: 1)  Est. Patient Research Study [04200] 2)  T-Comprehensive Metabolic Panel [[id number]] 3)  T-Lipid Profile [[id number]]

## 2010-09-26 ENCOUNTER — Encounter (INDEPENDENT_AMBULATORY_CARE_PROVIDER_SITE_OTHER): Payer: Self-pay | Admitting: *Deleted

## 2010-10-01 ENCOUNTER — Ambulatory Visit: Payer: Self-pay | Admitting: Adult Health

## 2010-10-01 ENCOUNTER — Telehealth: Payer: Self-pay | Admitting: Internal Medicine

## 2010-10-01 NOTE — Miscellaneous (Signed)
Summary: [name]   Imported By: Florinda Marker 09/25/2010 10:16:59  _____________________________________________________________________  External Attachment:    Type:   Image     Comment:   External Document

## 2010-10-01 NOTE — Miscellaneous (Signed)
Summary: ADAP Update  Clinical Lists Changes  Observations: Added new observation of AIDSDAP: Pending-approval 2012 (09/26/2010 9:37) Added new observation of PCTFPL: 126.13  (09/26/2010 9:37) Added new observation of HOUSEINCOME: 16109  (09/26/2010 9:37) Added new observation of YEARLYEXPEN: 0  (09/26/2010 9:37) Added new observation of FINASSESSDT: 09/25/2010  (09/26/2010 9:37)

## 2010-10-08 ENCOUNTER — Encounter (INDEPENDENT_AMBULATORY_CARE_PROVIDER_SITE_OTHER): Payer: Self-pay | Admitting: *Deleted

## 2010-10-10 NOTE — Miscellaneous (Signed)
Summary: HIV-1 RNA, CD4 (RESEARCH)  Clinical Lists Changes  Observations: Added new observation of CD4 COUNT: 690 microliters (09/10/2010 10:39) Added new observation of CD4 %: 46 % (09/10/2010 10:39) Added new observation of HIV1RNA QA: 39 copies/mL (09/10/2010 10:39)

## 2010-10-10 NOTE — Progress Notes (Signed)
   Phone Note Call from Patient   Caller: Patient Summary of Call: c/o head cold since last thursday. not getting better despite OTCs. states he has a runny nose, sneezing,cough, sore throat, HA. states he did get a flu shot this year. to see B. Sundra Aland NP at 3:45. pt to be here at 3:30. states he has no insurance other than [employer] card Initial call taken by: Golden Circle RN,  October 01, 2010 11:09 AM

## 2010-10-14 LAB — COMPREHENSIVE METABOLIC PANEL
AST: 34 U/L (ref 0–37)
Albumin: 4.6 g/dL (ref 3.5–5.2)
Alkaline Phosphatase: 91 U/L (ref 39–117)
BUN: 19 mg/dL (ref 6–23)
CO2: 24 mEq/L (ref 19–32)
Chloride: 104 mEq/L (ref 96–112)
GFR calc non Af Amer: >60 mL/min (ref >60)
Potassium: 3.6 mEq/L (ref 3.5–5.1)
Total Bilirubin: 0.6 mg/dL (ref 0.3–1.2)

## 2010-10-14 LAB — GLUCOSE, CAPILLARY
Glucose-Capillary: 143 mg/dL — ABNORMAL HIGH (ref 70–99)
Glucose-Capillary: 186 mg/dL — ABNORMAL HIGH (ref 70–99)

## 2010-10-14 LAB — LIPID PANEL
HDL: 28 mg/dL — ABNORMAL LOW (ref >39)
LDL Cholesterol: UNDETERMINED mg/dL (ref 0–99)
Total CHOL/HDL Ratio: 5.6 RATIO
Triglycerides: 824 mg/dL — ABNORMAL HIGH (ref <150)
VLDL: UNDETERMINED mg/dL (ref 0–40)

## 2010-10-14 LAB — CARDIAC PANEL(CRET KIN+CKTOT+MB+TROPI)
Relative Index: 6.2 — ABNORMAL HIGH (ref 0.0–2.5)
Troponin I: 0.01 ng/mL (ref 0.00–0.06)
Troponin I: 0.02 ng/mL (ref 0.00–0.06)

## 2010-10-14 LAB — BASIC METABOLIC PANEL
BUN: 19 mg/dL (ref 6–23)
Calcium: 8.8 mg/dL (ref 8.4–10.5)
Creatinine, Ser: 0.94 mg/dL (ref 0.4–1.5)
GFR calc non Af Amer: >60 mL/min (ref >60)
Glucose, Bld: 137 mg/dL — ABNORMAL HIGH (ref 70–99)
Potassium: 3.2 mEq/L — ABNORMAL LOW (ref 3.5–5.1)

## 2010-10-14 LAB — CBC
HCT: 35.1 % — ABNORMAL LOW (ref 39.0–52.0)
Hemoglobin: 14.3 g/dL (ref 13.0–17.0)
Hemoglobin: 13 g/dL (ref 13.0–17.0)
MCH: 42.3 pg — ABNORMAL HIGH (ref 26.0–34.0)
MCHC: 37 g/dL — ABNORMAL HIGH (ref 30.0–36.0)
MCV: 111.1 fL — ABNORMAL HIGH (ref 78.0–100.0)
MCV: 114.3 fL — ABNORMAL HIGH (ref 78.0–100.0)
Platelets: 156 10*3/uL (ref 150–400)
RBC: 3.42 MIL/uL — ABNORMAL LOW (ref 4.22–5.81)
RDW: 13.5 % (ref 11.5–15.5)
WBC: 5.6 10*3/uL (ref 4.0–10.5)

## 2010-10-14 LAB — POCT CARDIAC MARKERS: Troponin i, poc: <0.05 ng/mL (ref 0.00–0.09)

## 2010-10-14 LAB — URINALYSIS, ROUTINE W REFLEX MICROSCOPIC
Bilirubin Urine: NEGATIVE
Ketones, ur: NEGATIVE mg/dL
Nitrite: NEGATIVE
Specific Gravity, Urine: 1.02 (ref 1.005–1.030)
Urobilinogen, UA: 0.2 mg/dL (ref 0.0–1.0)
pH: 6 (ref 5.0–8.0)

## 2010-10-14 LAB — TSH: TSH: 1.742 u[IU]/mL (ref 0.350–4.500)

## 2010-10-14 LAB — VITAMIN B12: Vitamin B-12: 275 pg/mL (ref 211–911)

## 2010-10-14 LAB — DRUGS OF ABUSE SCREEN W/O ALC, ROUTINE URINE
Amphetamine Screen, Ur: NEGATIVE
Benzodiazepines.: NEGATIVE
Marijuana Metabolite: NEGATIVE
Opiate Screen, Urine: NEGATIVE
Phencyclidine (PCP): NEGATIVE
Propoxyphene: NEGATIVE

## 2010-10-14 LAB — DIFFERENTIAL
Basophils Absolute: 0 10*3/uL (ref 0.0–0.1)
Lymphs Abs: 1.6 10*3/uL (ref 0.7–4.0)
Monocytes Absolute: 0.7 10*3/uL (ref 0.1–1.0)
Neutro Abs: 3.1 10*3/uL (ref 1.7–7.7)

## 2010-10-14 LAB — HEMOGLOBIN A1C: Hgb A1c MFr Bld: 5.2 % (ref <5.7)

## 2010-10-14 LAB — PROTIME-INR: Prothrombin Time: 13 seconds (ref 11.6–15.2)

## 2010-10-15 NOTE — Miscellaneous (Signed)
Summary: ADAP APPROVED TIL 05/04/11  Clinical Lists Changes  Observations: Added new observation of AIDSDAP: Yes 2012 (10/08/2010 17:31)

## 2010-10-16 LAB — T-HELPER CELL (CD4) - (RCID CLINIC ONLY): CD4 T Cell Abs: 490 uL (ref 400–2700)

## 2010-10-17 ENCOUNTER — Encounter (INDEPENDENT_AMBULATORY_CARE_PROVIDER_SITE_OTHER): Payer: Self-pay | Admitting: *Deleted

## 2010-10-21 LAB — T-HELPER CELL (CD4) - (RCID CLINIC ONLY): CD4 T Cell Abs: 560 uL (ref 400–2700)

## 2010-10-22 NOTE — Miscellaneous (Signed)
Summary: PRESCRIPTION ASSISTANCE  Clinical Lists Changes PFIZER MEDS:  LIPITOR AND NORVASC 90 EACH DELIVER ON 10/17/2010.  PATIENT NOTIFIED.

## 2010-10-27 ENCOUNTER — Other Ambulatory Visit: Payer: Self-pay | Admitting: Internal Medicine

## 2010-10-27 DIAGNOSIS — B20 Human immunodeficiency virus [HIV] disease: Secondary | ICD-10-CM

## 2010-10-28 ENCOUNTER — Other Ambulatory Visit: Payer: Self-pay | Admitting: *Deleted

## 2010-10-28 DIAGNOSIS — B20 Human immunodeficiency virus [HIV] disease: Secondary | ICD-10-CM

## 2010-10-28 DIAGNOSIS — K219 Gastro-esophageal reflux disease without esophagitis: Secondary | ICD-10-CM

## 2010-10-28 MED ORDER — OMEPRAZOLE 20 MG PO CPDR: 20.0000 mg | DELAYED_RELEASE_CAPSULE | Freq: Every day | ORAL | Status: DC

## 2010-10-28 MED ORDER — LAMIVUDINE-ZIDOVUDINE 150-300 MG PO TABS: 1.0000 | ORAL_TABLET | Freq: Two times a day (BID) | ORAL | Status: DC

## 2010-10-28 MED ORDER — NEVIRAPINE 200 MG PO TABS: 200.0000 mg | ORAL_TABLET | Freq: Two times a day (BID) | ORAL | Status: DC

## 2010-10-28 NOTE — Telephone Encounter (Signed)
Addended by: Starleen Arms on: 10/28/2010 04:44 PM   Modules accepted: Orders

## 2010-10-30 ENCOUNTER — Other Ambulatory Visit: Payer: Self-pay | Admitting: *Deleted

## 2010-10-30 DIAGNOSIS — E785 Hyperlipidemia, unspecified: Secondary | ICD-10-CM

## 2010-10-30 DIAGNOSIS — I10 Essential (primary) hypertension: Secondary | ICD-10-CM

## 2010-10-30 MED ORDER — ATORVASTATIN CALCIUM 20 MG PO TABS: 20.0000 mg | ORAL_TABLET | Freq: Every day | ORAL | Status: DC

## 2010-10-30 MED ORDER — AMLODIPINE BESYLATE 5 MG PO TABS: 5.0000 mg | ORAL_TABLET | Freq: Every day | ORAL | Status: DC

## 2010-10-30 NOTE — Telephone Encounter (Signed)
PAP rxes arrived.  Lipitor, #90 tablets, Lot # [id number], exp. Date 11/14, Norvasc #90 tablets, Lot# [id number], exp Date 04/2015.  Pt. Picked up drugs today. Jennet Maduro, RN

## 2010-11-10 LAB — T-HELPER CELL (CD4) - (RCID CLINIC ONLY)
CD4 % Helper T Cell: 42 % (ref 33–55)
CD4 T Cell Abs: 560 uL (ref 400–2700)

## 2010-11-12 LAB — T-HELPER CELL (CD4) - (RCID CLINIC ONLY): CD4 T Cell Abs: 520 uL (ref 400–2700)

## 2010-11-18 LAB — T-HELPER CELL (CD4) - (RCID CLINIC ONLY)
CD4 % Helper T Cell: 42 % (ref 33–55)
CD4 T Cell Abs: 550 uL (ref 400–2700)

## 2010-11-27 ENCOUNTER — Encounter: Payer: Self-pay | Admitting: Internal Medicine

## 2010-11-27 ENCOUNTER — Ambulatory Visit (INDEPENDENT_AMBULATORY_CARE_PROVIDER_SITE_OTHER): Payer: Self-pay | Admitting: Internal Medicine

## 2010-11-27 DIAGNOSIS — B20 Human immunodeficiency virus [HIV] disease: Secondary | ICD-10-CM

## 2010-11-27 DIAGNOSIS — K921 Melena: Secondary | ICD-10-CM

## 2010-11-27 DIAGNOSIS — R197 Diarrhea, unspecified: Secondary | ICD-10-CM

## 2010-11-27 DIAGNOSIS — F329 Major depressive disorder, single episode, unspecified: Secondary | ICD-10-CM

## 2010-11-27 MED ORDER — SERTRALINE HCL 100 MG PO TABS: 50.0000 mg | ORAL_TABLET | Freq: Every day | ORAL | Status: AC

## 2010-11-27 NOTE — Assessment & Plan Note (Signed)
Infection is unlikely given his suppressed HIV infection and the chronicity but I will check for C. Diff, giardia and cryptosporidium before starting antimotility agents.

## 2010-11-27 NOTE — Assessment & Plan Note (Signed)
His infection is well controlled. I will continue his current regimen.

## 2010-11-27 NOTE — Progress Notes (Signed)
  Subjective:    Patient ID: IHAN PAT, male    DOB: 1946/04/02, 65 y.o.   MRN: [id number]  HPI Banner's chronic diarrhea worsened 1 month ago. He also had recurrent hematochezia 5 days ago not associated with any straining, pain or intercourse. He is not sexually active. He does not feel depressed and wants to stop Zoloft. He has not missed any of his HIV meds.     Review of Systems     Objective:   Physical Exam  Constitutional: He appears well-developed and well-nourished. No distress.  HENT:  Mouth/Throat: Oropharynx is clear and moist. No oropharyngeal exudate.  Cardiovascular: Normal rate, regular rhythm and normal heart sounds.   No murmur heard. Pulmonary/Chest: Breath sounds normal. He has no wheezes. He has no rales.  Abdominal: Soft. Bowel sounds are normal. He exhibits no distension. There is no tenderness.       Rectal exam was defered because he has his 64 month old granddaughter and she cries when anyone else her holds her.  Psychiatric: He has a normal mood and affect.          Assessment & Plan:

## 2010-11-27 NOTE — Assessment & Plan Note (Signed)
I will have him taper off Zoloft over the next week.

## 2010-11-27 NOTE — Assessment & Plan Note (Signed)
He recalls having a normal colonoscopy in 2009 or 2010 but I cannot locate any report or documentation that he saw a gastroenterologist. I will see him back soon for rectal exam and consider GI evaluation.

## 2010-11-28 NOTE — Progress Notes (Signed)
Addended by: Jennet Maduro on: 11/28/2010 11:08 AM   Modules accepted: Orders

## 2010-11-29 LAB — CLOSTRIDIUM DIFFICILE EIA: CDIFTX: NEGATIVE

## 2010-11-29 LAB — GIARDIA/CRYPTOSPORIDIUM (EIA): Cryptosporidium Screen (EIA): NEGATIVE

## 2010-12-05 ENCOUNTER — Ambulatory Visit: Payer: Self-pay

## 2010-12-10 ENCOUNTER — Telehealth: Payer: Self-pay | Admitting: *Deleted

## 2010-12-10 NOTE — Telephone Encounter (Signed)
rec'd a fax from [employer]. They do not have any GI volunteers at this time. I LM for pt that he will be getting a copy of this letter as well.

## 2010-12-12 ENCOUNTER — Other Ambulatory Visit: Payer: Self-pay | Admitting: *Deleted

## 2010-12-12 ENCOUNTER — Ambulatory Visit: Payer: Self-pay

## 2010-12-12 MED ORDER — VALACYCLOVIR HCL 500 MG PO TABS: 500.0000 mg | ORAL_TABLET | Freq: Two times a day (BID) | ORAL | Status: DC

## 2010-12-16 ENCOUNTER — Telehealth: Payer: Self-pay | Admitting: *Deleted

## 2010-12-16 NOTE — Telephone Encounter (Signed)
Patient called to adv he has had a swollen lymph node in his R groin area for about a week now that is painful. He says that it has happened before but not stayed swollen this long and wants to see the doctor. After speaking with RN was adv to get the patient in clinic at first available appt so i transferred him to the front desk to make appt.

## 2010-12-17 ENCOUNTER — Telehealth: Payer: Self-pay | Admitting: *Deleted

## 2010-12-17 NOTE — Assessment & Plan Note (Signed)
[facility]                            CARDIOLOGY OFFICE NOTE   [name], [name]                         MRN:          [id number]  DATE:07/21/2008                            DOB:          1945/09/21    PRIMARY CARE PHYSICIAN:  Corwin Levins, MD   REASON FOR CONSULTATION:  The patient with history of known coronary  artery disease, status post six-vessel CABG in 2003 and recent episode  of chest pain.  The patient wishes to establish care with a local  cardiologist.   HISTORY OF PRESENT ILLNESS:  [name] is a pleasant 65 year old  Caucasian male with a past medical history significant for hypertension,  hyperlipidemia, diabetes mellitus, gastroesophageal reflux disease,  coronary artery disease status post six-vessel CABG in 2003 and HIV, who  presents today to establish cardiology care.  The patient was seen in  the office of Dr. Oliver Barre yesterday and at that time he complained of  an episode of severe left-sided chest pain that occurred 2 days ago.  The patient tells me that he was at work and was not exerting himself  when he had this sudden onset of a heavy left-sided pressure that did  not radiate.  There was associated weakness, nausea, and diaphoresis.  The patient sat down and rested and states that the pain lasted for  approximately 2 hours and was relieved on its own.  He has never taken  nitroglycerin before and did not take any for this episode 2 days ago.  There was no associated shortness of breath or palpitations.  The  patient denies any episodes of dizziness, near syncope, syncope,  orthopnea, PND, or lower extremity edema.  He tells me that he has been  in his normal state of good health other than some recent episodes of  diarrhea.  He has had no further evaluation of his coronary arteries  since his bypass procedure in 2003 and has not seen a cardiologist in  the last 4 years.  He has not been seen in the [facility]  [facility]  before.   PAST MEDICAL HISTORY:  1. Hypertension.  2. Hyperlipidemia.  3. History of diabetes mellitus that has since resolved.  4. Coronary artery disease, status post catheterization in October      2003 that showed severe triple-vessel coronary artery disease.  The      patient is now status post a 6-vessel CABG in October 2003.  5. Gastroesophageal reflux disease.  6. HIV followed by Dr. Orvan Falconer in the Infectious Disease Clinic.  7. Depression.  8. Chronic right hearing loss.  9. History of nephrolithiasis.   PAST SURGICAL HISTORY:  1. Six-vessel CABG in October 2003 (left internal mammary artery graft      to the mid LAD, saphenous vein graft connected sequentially to the      ramus intermedius in the obtuse marginal, sequential saphenous vein      graft connected to the PDA, and posterolateral branch, and      saphenous vein graft to the diagonal artery).  2. Vasectomy.  ALLERGIES:  No known drug allergies.   CURRENT MEDICATIONS:  1. Combivir 150-300 mg twice daily.  2. Viramune 200 mg twice daily.  3. Norvasc 5 mg once daily.  4. Lipitor 20 mg once daily.  5. Prilosec 20 mg once daily.  6. Atenolol 50 mg once daily.  7. Zoloft 50 mg once daily.  8. Enteric-coated aspirin 325 mg once daily.  9. Lisinopril/HCTZ 20/25 mg 2 tablets once daily.  10.Gemfibrozil 600 mg twice daily.   SOCIAL HISTORY:  The patient is divorced and has 4 children.  He denies  any tobacco use or illicit drug use.  He does admit to drinking 1-2  glasses of wine per week.  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   FAMILY HISTORY:  There is a family history of premature coronary artery  disease.   REVIEW OF SYSTEMS:  As stated in the history present illness, is  otherwise negative.   PHYSICAL EXAMINATION:  VITAL SIGNS:  Blood pressure 96/60, pulse 67 and  regular, respirations 12 and nonlabored.  GENERAL:  He is a pleasant middle-aged, thin,  Caucasian male in no acute  distress.  He is alert and oriented x3.  PSYCHIATRIC:  Mood and affect are normal.  NEUROLOGICAL:  No focal neurological deficits.  MUSCULOSKELETAL:  Muscle strength and tone is normal.  HEENT:  Normal.  SKIN:  Warm and dry.  NECK:  No JVD.  No carotid bruits.  No thyromegaly.  No lymphadenopathy.  LUNGS:  Clear to auscultation bilaterally without wheezes, rhonchi, or  crackles noted.  CARDIOVASCULAR:  Regular rate and rhythm with 1/6 systolic murmur heard  at the left sternal border.  No gallops or rubs are noted.  ABDOMEN:  Soft, nontender, and nondistended.  Bowel sounds are present.  EXTREMITIES:  No evidence of edema.  Pulses are 2+ in all extremities.   DIAGNOSTIC STUDIES:  A 12-lead EKG obtained in our office today shows  normal sinus rhythm with a ventricular rate of 67 beats per minute.  There is a possible left ventricular hypertrophy.  There are nonspecific  T-wave abnormalities noted.   ASSESSMENT AND PLAN:  This is a pleasant 65 year old Caucasian male with  known coronary artery disease, status post six-vessel coronary artery  bypass graft in 2003, who has diabetes mellitus, hypertension,  hyperlipidemia, and human immunodeficiency virus and is evaluated today  for a recent episode of chest pain.  The patient has had no recurrence  of his chest pain over the last 48 hours.  I think it would be most  appropriate at this time to proceed with a transthoracic echocardiogram  to assess his left ventricular function and to rule out any valvular  heart disease.  I would also like to perform an exercise nuclear stress  test.  The patient's episode of chest pain is concerning for angina.  If  his stress test shows any abnormalities, we would proceed with a  diagnostic left heart catheterization at that time.  The patient will be  seen back in this office in 2 weeks to review the results of this  testing.  He is aware that he should alert EMS, if he  has any recurrence  of his pain.  The patient will be given sublingual nitroglycerin to  take, if he has any recurrent episodes of chest discomfort.  I have  encouraged him to continue to follow up with his primary care physician  for his primary care needs.  Verne Carrow, MD  Electronically Signed    CM/MedQ  DD: 07/21/2008  DT: 07/22/2008  Job #: [id number]   cc:   Corwin Levins, MD

## 2010-12-17 NOTE — Telephone Encounter (Signed)
[facility] does not have a Solicitor on their panel.  Pt will need to find another way to obtain a screening colonoscopy.  RN will attempt to schedule with [facility].  Jennet Maduro, RN

## 2010-12-17 NOTE — Assessment & Plan Note (Signed)
[facility]                            CARDIOLOGY OFFICE NOTE   [name], [name]                         MRN:          [id number]  DATE:08/07/2008                            DOB:          1945/10/28    PRIMARY CARE PHYSICIAN:  Christian Levins, MD   HISTORY OF PRESENT ILLNESS:  [name] is a pleasant 65 year old  Caucasian male with a past medical history significant for hypertension,  hyperlipidemia, diabetes mellitus, gastroesophageal reflux disease,  coronary artery disease status post six-vessel CABG in 2003, as well as  HIV, who was initially seen in this office on July 21, 2008, with  complaints of one episode of chest pain.  He described the pain as a  severe left-sided discomfort that occurred while he was at rest.  There  was associated weakness, nausea, and diaphoresis.  The pain lasted for  approximately 2 hours and was relieved on its own after sitting down to  rest.  Because of his episode of pain, he was referred for an  echocardiogram as well as a nuclear stress test.  He returns today to  review the results of this testing.   The patient tells me that he has had no recurrent episodes of chest pain  and has been in his normal state of good health.  He denies any dyspnea,  palpitations, near syncope, syncope, orthopnea, PND, or lower extremity  edema.   PAST MEDICAL HISTORY:  Outlined in detail above.   CURRENT MEDICATIONS:  1. Atenolol 50 mg once daily.  2. Norvasc 5 mg once daily.  3. Lipitor 20 mg once daily.  4. Combivir 150/300 mg b.i.d.  5. Viramune 200 mg twice daily.  6. Multivitamin once daily.  7. Enteric-coated aspirin 325 mg once daily.  8. Zoloft 50 mg once daily.  9. Lisinopril/hydrochlorothiazide 20/25 mg 2 tablets once daily.  10.Gemfibrozil 600 mg once daily.   REVIEW OF SYSTEMS:  As stated in the history of present illness and is  otherwise negative.   PHYSICAL EXAMINATION:  VITALS:  Blood pressure  126/70, pulse 75 and  regular, respirations 12 and nonlabored.  GENERAL:  He is a pleasant thin, middle-aged Caucasian male in no acute  distress.  He is alert and oriented x3.  SKIN:  Warm and dry.  HEENT:  Normal.  NECK:  No JVD.  No carotid bruits.  No thyromegaly.  No lymphadenopathy.  LUNGS:  Clear to auscultation bilaterally without wheezes, rhonchi, or  crackles noted.  CARDIOVASCULAR:  Regular rate and rhythm without murmurs, gallops, or  rubs noted.  ABDOMEN:  Soft, nontender, nondistended.  Bowel sounds are present.  EXTREMITIES:  No evidence of edema.  Pulses are 2+ in all extremities.   DIAGNOSTIC STUDIES:  1. Echocardiogram performed on August 03, 2008, shows that the left      ventricular systolic function is normal with an ejection fraction      of 55-60%.  There were no left ventricular regional wall motion      abnormalities noted.  There was mild focal basal septal  hypertrophy.  Aortic valve thickness was mildly increased.  There      was mild mitral annular calcification.  There was an atrial septal      aneurysm noted.  2. Exercise Myoview stress test performed on August 03, 2008, shows      that the patient exercised for 9 minutes 30 seconds, stopped      secondary to fatigue, but no chest pain.  There were no significant      areas of ischemia.  The left ventricular contractility was noted to      be normal with normal thickening in all areas of the myocardium.      Ejection fraction was estimated at 62%.   ASSESSMENT AND PLAN:  This is a pleasant 65 year old Caucasian male with  known coronary artery disease as well as diabetes mellitus,  hypertension, hyperlipidemia, and human immunodeficiency virus who had a  recent episode of recurrent chest pain with rest.  His stress test shows  no evidence of myocardial ischemia.  His overall left ventricular  systolic function is preserved as demonstrated on the echocardiogram and  the stress test.  I reviewed  his medications and feel that he is on a  good medication profile.  I do not think that any further workup is  needed at this time.  I would like to see him back in my office in 6  months.  The patient will be given sublingual nitroglycerin pills to  take as needed for chest pain.  He is to call our office if he has any  change in his clinical status over the next 6 months.  He is also to  continue to follow with his primary care physician, Dr. Jonny Ruiz as needed.     Christian Carrow, MD  Electronically Signed    CM/MedQ  DD: 08/07/2008  DT: 08/08/2008  Job #: [id number]

## 2010-12-19 LAB — CLOSTRIDIUM DIFFICILE CULTURE-FECAL

## 2010-12-20 ENCOUNTER — Ambulatory Visit: Payer: Self-pay | Admitting: *Deleted

## 2010-12-20 ENCOUNTER — Ambulatory Visit (INDEPENDENT_AMBULATORY_CARE_PROVIDER_SITE_OTHER): Payer: Self-pay | Admitting: Adult Health

## 2010-12-20 VITALS — BP 129/77 | HR 61 | Temp 97.4°F | Ht 63.0 in | Wt 127.0 lb

## 2010-12-20 DIAGNOSIS — E119 Type 2 diabetes mellitus without complications: Secondary | ICD-10-CM

## 2010-12-20 DIAGNOSIS — B20 Human immunodeficiency virus [HIV] disease: Secondary | ICD-10-CM

## 2010-12-20 DIAGNOSIS — E881 Lipodystrophy, not elsewhere classified: Secondary | ICD-10-CM

## 2010-12-20 DIAGNOSIS — R599 Enlarged lymph nodes, unspecified: Secondary | ICD-10-CM

## 2010-12-20 DIAGNOSIS — H04129 Dry eye syndrome of unspecified lacrimal gland: Secondary | ICD-10-CM

## 2010-12-20 DIAGNOSIS — E785 Hyperlipidemia, unspecified: Secondary | ICD-10-CM

## 2010-12-20 HISTORY — DX: Lipodystrophy, not elsewhere classified: E88.1

## 2010-12-20 HISTORY — DX: Dry eye syndrome of unspecified lacrimal gland: H04.129

## 2010-12-20 LAB — LIPID PANEL
HDL: 25 mg/dL — ABNORMAL LOW (ref >39)
Total CHOL/HDL Ratio: 4.8 Ratio

## 2010-12-20 LAB — COMPREHENSIVE METABOLIC PANEL
AST: 38 U/L — ABNORMAL HIGH (ref 0–37)
Albumin: 4.5 g/dL (ref 3.5–5.2)
Alkaline Phosphatase: 99 U/L (ref 39–117)
BUN: 14 mg/dL (ref 6–23)
Creat: 0.68 mg/dL (ref 0.40–1.50)
Potassium: 3.7 mEq/L (ref 3.5–5.3)

## 2010-12-20 LAB — HEMOGLOBIN A1C: Hgb A1c MFr Bld: 5.8 % — ABNORMAL HIGH (ref <5.7)

## 2010-12-20 NOTE — Cardiovascular Report (Signed)
NAMEBOSTEN, NEWSTROM                            ACCOUNT NO.:  [id number]   MEDICAL RECORD NO.:  [id number]                   PATIENT TYPE:  OIB   LOCATION:  2899                                 FACILITY:  [facility]   PHYSICIAN:  Aram Candela. Tysinger, M.D.              DATE OF BIRTH:  [birth year]   DATE OF PROCEDURE:  05/26/2002  DATE OF DISCHARGE:                              CARDIAC CATHETERIZATION   PROCEDURE:  1. Left heart catheterization.  2. Coronary cineangiography.  3. Left internal mammary artery cineangiography.  4. Left ventricular cineangiography.  5. Abdominal aortogram.  6. Perclose of the right femoral artery.   INDICATIONS FOR PROCEDURE:  This 65 year old male had the onset of  exertional angina approximately two months ago and evaluation in the office  included a treadmill exercise tolerance test which was early double positive  for ischemic heart disease placing him in a moderate to high risk category.  He was then scheduled for cardiac catheterization.   DESCRIPTION OF PROCEDURE:  After signing an informed consent, the patient  was premedicated with 50 mg of Benadryl intravenously and brought to the  cardiac catheterization lab.  His right groin was prepped and draped in a  sterile fashion and anesthetized locally with 1% lidocaine.  A #6 French  introducer sheath was inserted percutaneously into the right femoral artery.  The #6 [place] #4 Judkins coronary catheters were used to make injections  into the native coronary arteries.  The right coronary catheter was used to  make a midstream injection into the left subclavian artery visualizing the  left internal mammary artery.  A #6 French pigtail catheter was used to  measure pressures in the left ventricle and aorta and to make midstream  injections into the left ventricle and abdominal aorta.  The patient  tolerated the procedure well and no complications were noted.  At the end of  the procedure, the catheter and  sheath were removed from the right femoral  artery and hemostasis was easily obtained with a Perclose closure system.   MEDICATIONS GIVEN:  None.   HEMODYNAMIC DATA:  1. Left ventricular pressure 133/13-17.  2. Aortic pressure 132/79 with a mean of 104.  3. Left ventricular ejection fraction was estimated at 60-65%.   CINE FINDINGS:  1. Coronary cineangiography     A. Left coronary artery:  The ostium and left main appear normal.     B. Left anterior descending:  The LAD has a segmental plaque in the        proximal to middle segment just distal to the first large        anterolateral branch.  This is a segmental 80% stenosis.  The first        large anterolateral branch also has a segmental plaque in its proximal        segment causing a segmental 60-70% stenosis.  The mid and  distal LAD        appear normal.     C. Circumflex coronary artery:  The circumflex is a large dominant vessel        supplying the posterolateral and posterior descending vessels.  There        is a focal concentric 80% stenosis in the proximal segment just after        the second marginal branch.  This is followed by a focal eccentric 95%        stenosis in the middle segment before the posterolateral branches.        There is a focal concentric lesion in the distal segment at the crux        causing a 60-70% stenosis just prior to the terminating posterior        descending branch.     D. Right coronary artery:  The right coronary artery is a small        diminutive vessel supplying only a right ventricular branch.  The        middle segment of this vessel has a focal 95% stenosis.     E. Left internal mammary artery:  Appears normal.  2. Left ventricular cineangiogram:  The left ventricular chamber size,     contractility, and wall thickness appear normal.  The mitral and aortic     valves appear normal.  3. Abdominal aortogram:  The abdominal aorta and renal arteries appear     normal.   FINAL  DIAGNOSES:  1. Three-vessel coronary artery disease with 80% stenosis of proximal left     anterior descending artery; 95% stenosis of mid circumflex, large     dominant vessel;  a 95% stenosis, mid right coronary artery, small     nondominant vessel.  2. Normal left internal mammary artery.  3. Normal left ventricular function.  4. Normal abdominal aorta and renal arteries.  5. Normal mitral and aortic valves.  6. Successful Perclose of the right femoral artery.   DISPOSITION:  Will monitor on the short-stay unit prior to discharge later  today.  Will also ask [facility] to see to make arrangements for followup for  their evaluation for coronary artery bypass graft surgery.                                               [name] R. Aleen Campi, M.D.    JRT/MEDQ  D:  05/26/2002  T:  05/26/2002  Job:  [id number]   cc:   Georgianne Fick, M.D.  [address]  [place]  [place] [address]  Fax: [phone]   Cardiac Catheterization Lab

## 2010-12-20 NOTE — Discharge Summary (Signed)
[name], [name]                            ACCOUNT NO.:  [id number]   MEDICAL RECORD NO.:  [id number]                   PATIENT TYPE:  INP   LOCATION:  2018                                 FACILITY:  [facility]   PHYSICIAN:  Salvatore Decent. Cornelius Moras, M.D.              DATE OF BIRTH:  11-26-1945   DATE OF ADMISSION:  05/26/2002  DATE OF DISCHARGE:  05/31/2002                                 DISCHARGE SUMMARY   DISCHARGE DIAGNOSES:  1. Severe three-vessel atherosclerotic coronary artery disease with new-     onset class II angina.  2. Acute blood loss anemia postoperatively.   SECONDARY DIAGNOSES:  1. Human immunodeficiency virus-positive serology.  2. Hypertension.  3. Hyperlipidemia.  4. Type 2 diabetes mellitus.  5. History of nephrolithiasis secondary to antiviral medications.   PROCEDURES:  1. May 26, 2002, left heart catheterization, Dr. Aram Candela. Tysinger.  This     study showed three-vessel atherosclerotic coronary artery disease and 80%     stenosis of the proximal left anterior descending, 95% mid-circumflex     stenosis and 95% mid right coronary artery stenosis.  The left internal     mammary artery was normal.  The left ventricular function was normal.  2. May 27, 2002, coronary artery bypass graft surgery x6, Dr. Purcell Nails.  In this procedure, the left internal mammary artery was     connected in an end-to-side fashion to the left anterior descending     coronary artery.  Bypasses were also performed from the aorta to the     diagonal, then a sequential reversed saphenous vein graft was fashioned     from the aorta to the intermediate and then to the circumflex marginal.     A sequential saphenous vein graft was fashioned to the left posterior     descending coronary artery and to the left posterolateral branch.   DISCHARGE DISPOSITION:  The patient is ready for discharge, October 28th,  postoperative day #4.  He has been afebrile in the postoperative  period.  His mental status has been clear.  He has had intermittent nausea which has  resolved after weaning off narcotic medications and after successful bowel  movement.  He has postoperative blood loss anemia and has so far foregone  transfusion with a hemoglobin of 7.4 on postoperative day #3.  He has had  some postoperative fatigue secondary probably to his anemia and some  postoperative sinus tachycardia also attributable to his anemia.  He was  seen postoperatively by Dr. Cliffton Asters, his infectious disease  specialist, and was restarted on his antiretroviral medications as soon as  he was taking medications orally.  The patient's incisions are healing well,  he is ambulating without assistance and he has been without oxygen  supplementation since postoperative day #1.   DISCHARGE MEDICATIONS:  He goes home with the following medications:  1. Ultram 50 mg one to two tabs p.o. q.4-6h. p.r.n. pain.  2. Combivir twice daily.  3. Viramune 200 mg twice daily.  4. Enteric-coated aspirin 325 mg daily.  5. Toprol-XL 25 mg daily.  6. Lotensin/hydrochlorothiazide 20/25 mg daily.  7. Niferex 150 mg daily.  8. Glucotrol XL 10 mg daily.  9. Avandia 4 mg daily.   DISCHARGE ACTIVITY:  He is to walk daily to build up his strength.  He is  asked not to lift more than 10 pounds nor to drive until he sees Dr. Cornelius Moras in  consultation.   DIET:  His discharge diet is a low-sodium, low-cholesterol ADA diet.   SPECIAL DISCHARGE INSTRUCTIONS:  He may shower daily.   FOLLOWUP:  He has an office visit with Dr. Aleen Campi two weeks after  discharge; he is asked to call [phone] to arrange the appointment.  A chest  x-ray will be taken.  He has an office visit with Dr. Cornelius Moras three weeks after  discharge; Dr. Orvan July office will call with his appointment.   BRIEF HISTORY:  The patient is a 65 year old male with no previous cardiac  history, but he has risk factors which include a history of  hypertension,  hyperlipidemia and a strong family history of coronary artery disease.  He  presents now with a two-month history of new-onset exertional angina.  He  describes classical burning substernal chest pain brought on by physical  exertion and relieved by rest.  His symptoms have progressed.  He underwent  a stress test on October 22nd, positive for symptoms of angina and showed  EKG changes.  He presents to [facility] on October 23rd  for elective left heart catheterization by Dr. Charolette Child.   HOSPITAL COURSE:  Hospital course is as described in discharge disposition.  The patient was admitted to [facility] for elective left heart  catheterization on October 23rd.  This study showed severe three-vessel  atherosclerotic coronary artery disease.  He was seen in consultation by Dr.  Tressie Stalker of the [facility] surgical group.  The risks and benefits of  revascularization surgery were presented to the patient.  The patient  understood the risks and elected to undergo the surgery; this was done on  October 24th; six bypasses were placed.  The patient tolerated the  postoperative period well and is ready for discharge on postoperative day  #4.  He was extubated on postoperative day #1 and was achieving room air  saturations that same day.  His mental status has been clear.  He has been  afebrile.  His incisions are healing nicely.  He is taking all nourishment  well and is ready for discharge, October 28th, with the medications and  followup as dictated above.     Maple Mirza, P.A.                    Salvatore Decent. Cornelius Moras, M.D.    GM/MEDQ  D:  05/30/2002  T:  05/31/2002  Job:  [id number]   cc:   Aram Candela. Aleen Campi, M.D.  [address]  [place]  [place] [address]  Fax: [phone]   Georgianne Fick, M.D.  [address]  [place]  [place] [address]  Fax: [phone]   Cliffton Asters, MD [address] [place]  [place] [address]  Fax:  [phone]

## 2010-12-20 NOTE — Progress Notes (Signed)
Subjective:    Patient ID: [name], male    DOB: Jul 06, 1946, 65 y.o.   MRN: [id number]  HPI  presents to clinic today with complaints of lymph nodes swelling in the right inguinal area. States this occurred before, but it is usually gone away. However, this time, the swelling has remained for the past 2 weeks. He denies any pain or lymph node enlargement or additional enlarged lymph nodes in the same area. Denies any fevers, chills, or sweats. He also complains of chronic "dry eye" for the past 2 months. Denies any eye irritation or redness or inflammation to the eyes.   Review of Systems  Constitutional: Negative for fever, chills, diaphoresis and fatigue.  HENT: Negative.   Eyes: Negative for photophobia, pain, discharge, redness, itching and visual disturbance.       As per history of present illness  Respiratory: Negative.   Cardiovascular: Negative.   Gastrointestinal: Negative.   Genitourinary: Negative for dysuria, urgency, frequency, hematuria, flank pain, decreased urine volume, discharge, penile swelling, scrotal swelling, enuresis, difficulty urinating, genital sores, penile pain and testicular pain.  Musculoskeletal: Negative.   Skin: Negative.   Neurological: Negative.   Hematological: Positive for adenopathy. Does not bruise/bleed easily.  Psychiatric/Behavioral: Negative.        Objective:   Physical Exam  Constitutional: He is oriented to person, place, and time. He appears well-developed and well-nourished.       General appearance demonstrates nasolabial fold flattening and atrophy with parotid swelling, and adipose, wasting 2 limbs, with increased abdominal girth.  HENT:  Head: Normocephalic and atraumatic.  Right Ear: External ear normal.  Left Ear: External ear normal.  Nose: Nose normal.  Eyes: Conjunctivae and EOM are normal. Pupils are equal, round, and reactive to light. Right eye exhibits no discharge. Left eye exhibits no discharge.  Neck: Normal  range of motion. Neck supple.  Cardiovascular: Normal rate, regular rhythm and normal heart sounds.   Pulmonary/Chest: Effort normal and breath sounds normal.  Abdominal: Soft. Bowel sounds are normal.  Genitourinary: Rectum normal, prostate normal and penis normal. No penile tenderness.  Musculoskeletal: Normal range of motion.  Lymphadenopathy:       There is a 1 cm, ovoid, rubbery lymph node noted in the mid right inguinal chain. It is nontender, noninflamed and demonstrates no irregular character. In the left inguinal chain, there is a similar, but very much smaller lymph node, located in the same area of the chain. Again, this is nontender, noninflamed and demonstrates no irregular character. No significant lymph nodes noted, superior or inferior to the isolated enlarged lymph node. There is noted smaller, rubbery, nontender. Lymph nodes noted in the axillae, as well as the cervical chains.  Neurological: He is alert and oriented to person, place, and time. No cranial nerve deficit. He exhibits normal muscle tone. Coordination normal.  Skin: Skin is warm and dry. No rash noted. No erythema. No pallor.  Psychiatric: He has a normal mood and affect. His behavior is normal. Judgment and thought content normal.          Assessment & Plan:  1. Lymphadenopathy. While there appears to be an isolated enlargement. It shows no character, both on examination, and by history. That can be considered pathologic. Recommend at this point we continue to watch it and should it become larger, develop tenderness, change, character, he should contact the clinic for reevaluation. He was informed that if these things occur. He may need to be referred for excisional biopsy.  He verbally acknowledged this and agreed with plan of care.  2. Dry Eyes. By history alone, but not by examination, he has been having problems, most likely with lacrimal depletion or dysfunction. Again, given the lack of clinical evidence  necessary to treat. Major problem here, recommend that he use artificial tears when necessary keep the eye moist. However, he was instructed should, irritation develop, he may need be seen by an ophthalmologist for further evaluation and management.  3. Incidental Finding of Dystrophic Body Habitus Changes. During the examination, he did address some of the physical appearance issues he experiences. We discussed briefly his current antiretroviral regimen, and how there might be some relationship. I told him I would discuss with Dr. Orvan Falconer the possibility of a stable treatment switch. He is scheduled to be seen by the research nurse in 2 weeks at which time we may be able to tell him of possible treatment changes. We will discuss the possibility changing his Viramune to XR daily, and his Combivir to Truvada.  4. Diabetes. He states that he attempted to become part of a hypertension. Study at [facility] and was disqualified as a result of continued elevations in his glucose fasting. He states he has not had a recent hemoglobin A1c or lipid panel and was requesting that. We will order that today for him and have the results available for his next visit with the research nurse.  He verbally acknowledged all this information and agreed with current plan of care.Marland Kitchen

## 2010-12-20 NOTE — Op Note (Signed)
NAMEKEEON, ZURN                            ACCOUNT NO.:  [id number]   MEDICAL RECORD NO.:  [id number]                   PATIENT TYPE:  INP   LOCATION:  2314                                 FACILITY:  [facility]   PHYSICIAN:  Salvatore Decent. Cornelius Moras, M.D.              DATE OF BIRTH:  [birth year]   DATE OF PROCEDURE:  05/27/2002  DATE OF DISCHARGE:                                 OPERATIVE REPORT   PREOPERATIVE DIAGNOSES:  Severe three vessel coronary artery disease with  class 2 new onset angina.   POSTOPERATIVE DIAGNOSES:  Severe three vessel coronary artery disease with  class 2 new onset angina.   PROCEDURE:  Median sternotomy for coronary artery bypass grafting x6 (left  internal mammary artery to distal left anterior descending coronary artery,  saphenous vein graft to first diagonal branch, saphenous vein graft to ramus  intermediate branch and sequential saphenous vein graft to circumflex  marginal branch, saphenous vein graft to left posterior descending coronary  artery and sequential saphenous vein graft to left posterolateral branch).   SURGEON:  Salvatore Decent. Cornelius Moras, M.D.   ASSISTANT:  Coral Ceo, PA   ANESTHESIA:  General.   BRIEF CLINICAL NOTE:  The patient is a 65 year old male with a history of  hypertension, type 2 diabetes mellitus, and HIV positive. He presents with  new onset symptoms of angina. Cardiac catheterization revealed severe three  vessel coronary artery disease and normal left ventricular function. A full  consultation note has been dictated previously. Alternative treatment  strategies have been discussed with the patient. All the questions have been  addressed. He desires to proceed with surgery as described. He accepts all  associated risks including but not limited to risk of death, stroke,  myocardial infarction, bleeding requiring blood transfusion, arrhythmia,  infection, recurrent coronary artery disease.   DESCRIPTION OF PROCEDURE:  The patient  is brought to the operating room on  the above mentioned date and sent for monitoring which was established by  the anesthesia service under the care and direction of Dr. Franciso Bend.  Specifically, a Swan-Ganz catheter was placed through the right internal  jugular approach. A radial arterial line is placed. The intravenous  antibiotics are administered. Following induction with general endotracheal  anesthesia, a Foley catheter is placed. The patient's chest, abdomen, both  groins and both lower extremities are prepared and draped in a sterile  manner.   A median sternotomy incision is performed and the left internal mammary  artery is dissected from the chest wall and prepared for bypass grafting.  The left internal mammary artery is notably a good quality conduit.  Simultaneously the saphenous vein is obtained from the patient's right thigh  and the right lower leg using a combination of open and endoscopic vein  harvest technique. The saphenous vein is notably a good quality conduit. The  patient is heparinized systemically.   The pericardium  is opened. The ascending aorta is normal in appearance. The  ascending aorta and the right atrium are cannulated for cardiopulmonary  bypass. Adequate heparinization is verified. Cardiopulmonary bypass is begun  and the surface of the heart is inspected. Distal sites are selected for  coronary bypass grafting. Portions of saphenous vein in the left internal  mammary artery are trimmed to appropriate lengths. A temperature probe is  placed in the left ventricular septum. A cardioplegic catheter is placed in  the ascending aorta.   The patient is cooled to 30 degrees systemic temperature. The aortic cross  clamp is applied and Cardioplegia is delivered in an antegrade fashion  through the aortic root. Ice saline flush is applied for topical  hyperthermia. The initial cardioplegic rest and myocardial cooling are felt  to be excellent. Repeat  doses of cardioplegia are administered  intermittently throughout the cross clamp portion of the operation both  through the aortic root and down subsequently placed vein graft to maintain  septal temperature below 15 degrees centigrade. The following distal  coronary anastomoses are performed:  1. The posterior descending coronary artery is grafted with a saphenous vein     graft in a side to side fashion. This coronary artery is a branch off the     distal left circumflex coronary artery as the patient has left dominant     circulation. This accordingly measures 1.5 mm in diameter and is of good     quality at the site of the distal bypass.  2. The posterolateral branch off the distal left circumflex coronary artery     is grafted with the sequential superior vena cava using the vein placed     to the posterior descending coronary artery. This coronary artery     measures 1.1 mm in diameter and is of fair quality.  3. The ramus intermediate branch is grafted with a saphenous vein graft in a     side to side fashion. This accordingly measures 1.6 mm in diameter and is     of good quality.  4. The circumflex marginal branch is grafted using a sequential saphenous     vein graft off of the vein placed at the ramus intermediate branch. This     accordingly measures 1.5 mm in diameter and is of good quality.  5. The first diagonal branch off the left anterior descending coronary     artery is grafted with a saphenous vein graft in an end to side fashion.     This accordingly measures 1.4 mm in diameter and is of good quality.  6. The distal left anterior descending coronary artery is grafted with the     left internal mammary artery in an end-to-side fashion. This accordingly     measures 1.6 mm in diameter and is of good quality. All three proximal     saphenous vein anastomoses are performed directly to the ascending aorta    prior to removal of the aorta cross clamp. The septal temperature  is     noted to rise rapidly and dramatically upon reperfusion of the left     internal mammary artery. The patient is placed in Trendelenburg position     and all air was evacuated from the aortic root. The aortic cross clamp is     removed after a total cross clamp time of 120 minutes.   The heart begins to beat spontaneously without need for cardioversion.  Normal sinus rhythm resumes. All proximal and distal  anastomoses are  inspected for hemostasis and appropriate graft orientation. Epicardial  pacing wires are fixed to the ventricular outflow tract and to the right  atrial appendage. The patient rewarmed to greater than 37 degrees centigrade  temperature. The patient is weaned from cardiopulmonary bypass without  difficulty. The patient's rhythm at separation from bypass is normal sinus  rhythm. No inotropic support is required. Total cardiopulmonary bypass time  for the operation is 150 minutes.   The venous and arterial cannulae are removed uneventfully. Protamine is  administered to reverse anticoagulation. The mediastinum and the left chest  are irrigated with saline solution containing vancomycin. Meticulous  surgical hemostasis is ascertained. The mediastinum and both left and right  pleural spaces are drained with four chest tubes placed through separate  stab incisions inferiorly. The median sternotomy is closed in a routine  fashion. The right lower extremity incisions are closed in multiple layers  in routine fashion. All skin incisions are closed through subcuticular skin  closures.   The patient tolerated the procedure well and was transported to the surgical  intensive care unit in stable condition. There were no intraoperative  complications. All sponge, needle and instrument counts were verified  correct at the completion of the operation. No blood products were  administered.                                               Salvatore Decent. Cornelius Moras, M.D.    CHO/MEDQ   D:  05/27/2002  T:  05/29/2002  Job:  [id number]   cc:   Aram Candela. Aleen Campi, M.D.  [address]  [place]  [place] [address]  Fax: [phone]   Thora Lance, M.D.  [address] [place]  [place] [address]  Fax: [phone]   Cliffton Asters, MD  [address] [place]  [place] [address]  Fax: [phone]

## 2010-12-20 NOTE — Consult Note (Signed)
[name], [name]                            ACCOUNT NO.:  [id number]   MEDICAL RECORD NO.:  [id number]                   PATIENT TYPE:  OIB   LOCATION:  2899                                 FACILITY:  [facility]   PHYSICIAN:  Salvatore Decent. Cornelius Moras, M.D.              DATE OF BIRTH:  [birth year]   DATE OF CONSULTATION:  05/26/2002  DATE OF DISCHARGE:                                   CONSULTATION   PRIMARY CARE PHYSICIAN:  Dr. Georgianne Fick.   CONSULTING PHYSICIAN:  Dr. Cliffton Asters.   REASON FOR CONSULTATION:  Severe three-vessel coronary artery disease with  new-onset exertional angina.   HISTORY OF PRESENT ILLNESS:  The patient is a 65 year old male with no  previous cardiac history but risk factors including history of hypertension,  hyperlipidemia, and a strong family history of coronary artery disease.  He  presents with a two-month history of new-onset exertional angina.  He  describes classical burning substernal chest pain which is brought on with  physical exertion and relieved by rest.  His symptoms have progressed over  the last couple of months.  He underwent exercise stress test on October  22nd, which was positive for symptoms of angina and EKG changes.  He  underwent elective cardiac catheterization today by Dr. Aram Candela. Tysinger.  This demonstrates left dominant coronary circulation with severe three-  vessel coronary artery disease and normal left ventricular function.  Cardiac surgical consultation is requested.   REVIEW OF SYSTEMS:  GENERAL:  The patient reports worsening exertional  fatigue over the last several months.  He otherwise has felt well.  He  reports normal appetite with no changes in weight loss or weight gain.  CARDIAC:  Notable for symptoms of exertional angina which are classical in  nature.  He denies any symptoms of chest pain occurring at rest or at night.  He has not had any pains with minimal activity, and all of his episodes of  angina have  been relieved by rest.  They are associated with mild exertional  shortness of breath.  He denies any resting shortness of breath, PND,  orthopnea, or lower extremity edema.  He has not had any palpitations,  syncope, or near-syncopal episodes.  RESPIRATORY:  Notable for dry  nonproductive cough which he attributes to seasonal allergies.  He denies  any history of productive cough, hemoptysis, wheezing.  GASTROINTESTINAL:  Negative.  The patient reports good appetite.  He denies any problems  swallowing.  He has not had any difficulty with hematemesis, hematochezia,  melena, constipation, diarrhea.  NEUROLOGIC:  Negative.  MUSCULOSKELETAL:  Mild generalized aches which he thinks may be related to mild arthritis.  GENITOURINARY:  Negative.  The patient has had kidney stones in the past but  no problems recently.  INFECTIOUS:  The patient denies any recent fevers,  chills or malaise.  He is followed by Dr. Orvan Falconer with  a history of HIV-  positive serology.  By report, he has had normal CD4 counts.  He has not  been seen by Dr. Orvan Falconer in approximately one year.  HEMATOLOGIC:  Negative.  The patient denies any bleeding diathesis.  ENDOCRINE:  Notable  for type 2 diabetes mellitus which has been diagnosed for three or four  years.  He does not check his sugars regularly.  PSYCHIATRIC:  Negative.  PERIPHERAL VASCULAR:  Negative.  HEENT:  Notable for some ringing in his  right ear and mild decreased hearing sensation in the right ear.   PAST MEDICAL HISTORY:  Past medical history is notable for history of  hypertension, hyperlipidemia, type 2 diabetes mellitus, and HIV-positive  serology.  The patient has a history of kidney stones attributed to one of  his HIV antiviral medications.  He denies any history of prior myocardial  infarction, congestive heart failure, stroke.  He denies any history of  infectious complications related to HIV.   PAST SURGICAL HISTORY:  Past surgical history is  notable for vasectomy in  the past.   SOCIAL HISTORY:  The patient is divorced and currently lives with his  partner and one daughter here in [place].  He works as a Engineer, materials.  He is a nonsmoker and  denies any significant alcohol consumption.  He denies any history of  illicit drug use.   MEDICATIONS:  Medications prior to admission include Lotensin, Glucotrol,  Avandia, Combivir, Viramune, aspirin, Ambien, Tylenol, multivitamin.  The  patient was just started on Toprol-XL 25 mg once daily.   ALLERGIES:  The patient denies any significant drug allergies or  sensitivities.   PHYSICAL EXAMINATION:  GENERAL:  Physical exam is notable for a well-  appearing male who is thin and appears stated age, or perhaps younger, and  in no acute distress.  HEENT:  Exam is grossly within normal limits.  NECK:  The neck is supple.  There is no cervical or supraclavicular  lymphadenopathy.  There is no jugular venous distention.  CHEST:  Auscultation of the chest reveals clear and symmetrical breath  sounds bilaterally.  No wheezes or rhonchi demonstrated.  CARDIOVASCULAR:  Exam notable for regular rate and rhythm.  No murmurs,  rubs, or gallops are noted.  ABDOMEN:  The abdomen is soft and nontender.  The liver edge is not  enlarged.  There are no palpable masses.  Bowel sounds are present.  EXTREMITIES:  The extremities are warm and well-perfused.  His lower legs  are very thin and the saphenous vein is visible through the skin.  It  appears to be good quality.  The patient is so thin that I do not feel that  feel that endoscopic vein harvest will likely be technically feasible due to  the lack of soft tissue covering.  Distal pulses are easily palpable in both  lower legs at the ankles.  His radial pulses are very small and thready and  suggestive of very small-caliber vessels. NEUROLOGIC:  Examination is grossly nonfocal and symmetrical  throughout.  SKIN:  The skin is clean, dry and healthy-appearing throughout.  NOTE:  The remainder of his physical exam is noncontributory.   DIAGNOSTIC TEST:  Cardiac catheterization performed today by Dr. Aleen Campi is  reviewed.  This demonstrates left-dominant coronary circulation with severe  three-vessel coronary artery disease.  Specifically, there is 85-95%  stenosis of the proximal left anterior descending coronary artery.  There is  80% proximal stenosis  of a first diagonal branch.  There is 70% proximal  stenosis of a ramus intermediate branch.  There is 70-80% proximal stenosis  of a large first circumflex marginal branch.  There is 95% stenosis of the  mid left circumflex coronary artery.  The distal left circumflex coronary  artery gives rise to a large posterolateral branch and a large posterior  descending coronary artery.  There are 70-80% proximal stenoses in both of  these vessels.  There is 90% stenosis of the mid right coronary artery,  which is very small and non-dominant.  There is normal left ventricular  function.   IMPRESSION:  Severe three-vessel coronary artery disease with new-onset  exertional angina.  Co-morbidities include hypertension, type 2 diabetes  mellitus, and human immunodeficiency virus-positive serology as well as  hyperlipidemia.  I believe that the patient would benefit for coronary  artery bypass grafting.   PLAN:  I have outlined at length the indications and potential benefits of  coronary artery bypass grafting with the patient and Mr. Bernestine Amass, his  significant-other.  Alternative treatment strategies have been discussed.  All of their questions have been addressed.  They understand and accept all  associated risks of surgery including, but not limited to, risks of death,  stroke, myocardial infarction, respiratory failure, bleeding requiring blood  transfusion, arrhythmia, infection, and recurrent coronary artery disease.  We plan for  surgery first case tomorrow.                                               Salvatore Decent. Cornelius Moras, M.D.    CHO/MEDQ  D:  05/26/2002  T:  05/27/2002  Job:  [id number]   cc:   Aram Candela. Aleen Campi, M.D.  [address]  [place]  [place] [address]  Fax: [phone]   Georgianne Fick, M.D.  [address]  [place]  [place] [address]  Fax: [phone]   Cliffton Asters, MD  [address] [place]  [place] [address]  Fax: [phone]

## 2010-12-21 LAB — CBC WITH DIFFERENTIAL/PLATELET
Basophils Absolute: 0 10*3/uL (ref 0.0–0.1)
Basophils Relative: 0 % (ref 0–1)
HCT: 36.6 % — ABNORMAL LOW (ref 39.0–52.0)
MCHC: 35.2 g/dL (ref 30.0–36.0)
Monocytes Absolute: 0.6 10*3/uL (ref 0.1–1.0)
Neutro Abs: 2.7 10*3/uL (ref 1.7–7.7)
RDW: 14.3 % (ref 11.5–15.5)

## 2010-12-31 ENCOUNTER — Ambulatory Visit (INDEPENDENT_AMBULATORY_CARE_PROVIDER_SITE_OTHER): Payer: Self-pay | Admitting: *Deleted

## 2010-12-31 VITALS — BP 126/76 | HR 58 | Temp 97.7°F | Wt 126.5 lb

## 2010-12-31 DIAGNOSIS — B20 Human immunodeficiency virus [HIV] disease: Secondary | ICD-10-CM

## 2010-12-31 NOTE — Progress Notes (Signed)
12/31/2010: Pt here for Study A5001, week 704. Labs were drawn on 12/20/2010. Pt feeling well. C/o left eyelid droop that he has noticed since Dx of Bell Palsy. C/o Right groin lymph node enlargement that started 3 weeks ago. Pt states it is not painful and is hard. Traci Sermon NP and Dr. Orvan Falconer is aware and will see Dr. Orvan Falconer in the beginning of June for a f/u. Neither problem has affected his ADL's. Vital signs are stable. Received $20.00 gift card for visit. Will contact him for next study visit. -- Tacey Heap RN.

## 2011-01-02 ENCOUNTER — Telehealth: Payer: Self-pay | Admitting: *Deleted

## 2011-01-02 NOTE — Telephone Encounter (Signed)
Pt needs to obtain a [facility] discount card.  Instructed pt how to obtain.  Jennet Maduro, RN

## 2011-01-02 NOTE — Telephone Encounter (Signed)
Per red chart, he was getting these thru [facility] connection to [facility] [phone]. I called & re-ordered them in a 90 supply. We need to call in 60 days to refill for the next 90 days per rep at [employer]. His approval is good thru 06/04/11. They will have the meds to the pt in 1 wk. Pended to call them again end of July

## 2011-01-09 ENCOUNTER — Ambulatory Visit (INDEPENDENT_AMBULATORY_CARE_PROVIDER_SITE_OTHER): Payer: Self-pay | Admitting: Internal Medicine

## 2011-01-09 ENCOUNTER — Encounter: Payer: Self-pay | Admitting: Internal Medicine

## 2011-01-09 VITALS — BP 127/73 | HR 65 | Temp 98.1°F | Ht 63.0 in | Wt 125.5 lb

## 2011-01-09 DIAGNOSIS — E119 Type 2 diabetes mellitus without complications: Secondary | ICD-10-CM

## 2011-01-09 DIAGNOSIS — B20 Human immunodeficiency virus [HIV] disease: Secondary | ICD-10-CM

## 2011-01-09 DIAGNOSIS — I10 Essential (primary) hypertension: Secondary | ICD-10-CM

## 2011-01-09 DIAGNOSIS — E785 Hyperlipidemia, unspecified: Secondary | ICD-10-CM

## 2011-01-09 DIAGNOSIS — F329 Major depressive disorder, single episode, unspecified: Secondary | ICD-10-CM

## 2011-01-09 NOTE — Assessment & Plan Note (Signed)
His blood pressure remains well controlled. 

## 2011-01-09 NOTE — Assessment & Plan Note (Signed)
His random blood sugars remain elevated but his hemoglobin A1c is only 5.8 so we'll continue to monitor this at this time.

## 2011-01-09 NOTE — Assessment & Plan Note (Signed)
His triglycerides have gone up to 442 but this is probably related to having been off gemfibrozil which he has since restarted. I will repeat a lipid profile before his next visit in 3 months.

## 2011-01-09 NOTE — Assessment & Plan Note (Signed)
His depression is currently inactive and he successfully tapered off of Zoloft.

## 2011-01-09 NOTE — Progress Notes (Signed)
  Subjective:    Patient ID: [name], male    DOB: 25-Dec-1945, 65 y.o.   MRN: [id number]  HPI [name] is in for his routine followup visit. He did taper off of Zoloft after his last visit and states that he is actually feeling a little bit better. He has not had any recurrence of his depression and his chronic diarrhea has resolved. He did have some more financial difficulties and was off of gemfibrozil for much of last month including on May 18 when he was in for his lab work. He has not had any further hematochezia since his last visit.    Review of Systems     Objective:   Physical Exam  Constitutional: He appears well-developed and well-nourished. No distress.  HENT:  Mouth/Throat: Oropharynx is clear and moist. No oropharyngeal exudate.  Cardiovascular: Normal rate, regular rhythm and normal heart sounds.   No murmur heard. Pulmonary/Chest: Breath sounds normal. He has no wheezes. He has no rales.  Abdominal: Soft. Bowel sounds are normal. He exhibits no distension. There is no tenderness.  Psychiatric: He has a normal mood and affect.          Assessment & Plan:

## 2011-01-09 NOTE — Assessment & Plan Note (Signed)
His infection is well controlled. I will continue his current regimen. 

## 2011-01-10 ENCOUNTER — Other Ambulatory Visit: Payer: Self-pay | Admitting: *Deleted

## 2011-01-10 DIAGNOSIS — E785 Hyperlipidemia, unspecified: Secondary | ICD-10-CM

## 2011-01-10 DIAGNOSIS — I10 Essential (primary) hypertension: Secondary | ICD-10-CM

## 2011-01-10 MED ORDER — AMLODIPINE BESYLATE 5 MG PO TABS: 5.0000 mg | ORAL_TABLET | Freq: Every day | ORAL | Status: DC

## 2011-01-10 MED ORDER — ATORVASTATIN CALCIUM 20 MG PO TABS: 20.0000 mg | ORAL_TABLET | Freq: Every day | ORAL | Status: DC

## 2011-01-28 ENCOUNTER — Encounter: Payer: Self-pay | Admitting: Internal Medicine

## 2011-01-28 ENCOUNTER — Ambulatory Visit (INDEPENDENT_AMBULATORY_CARE_PROVIDER_SITE_OTHER): Payer: Self-pay | Admitting: Internal Medicine

## 2011-01-28 VITALS — BP 127/74 | HR 56 | Temp 97.6°F | Ht 63.0 in | Wt 122.5 lb

## 2011-01-28 DIAGNOSIS — N451 Epididymitis: Secondary | ICD-10-CM | POA: Insufficient documentation

## 2011-01-28 DIAGNOSIS — N453 Epididymo-orchitis: Secondary | ICD-10-CM

## 2011-01-28 MED ORDER — SULFAMETHOXAZOLE-TRIMETHOPRIM 800-160 MG PO TABS: 1.0000 | ORAL_TABLET | Freq: Two times a day (BID) | ORAL | Status: AC

## 2011-01-28 NOTE — Progress Notes (Signed)
  Subjective:    Patient ID: [name], male    DOB: 04-Aug-1946, 65 y.o.   MRN: [id number]  HPI Joud is seen on a work in basis today. Last night when he was putting on his pajamas he noticed some swelling and tenderness around his right testicle. He has never had anything like this before. It has not changed since he first noticed it. He has not had any dysuria or urethral discharge. He has not been sexually active. He has not had any fever.    Review of Systems     Objective:   Physical Exam  Constitutional: No distress.  Abdominal: Soft. Bowel sounds are normal. He exhibits no distension. There is no tenderness.  Genitourinary:       He has stable, bilateral, nontender inguinal adenopathy. He has no urethral discharge or penile lesions. His testicles are normal to palpation but he has epididymal swelling and slight tenderness on the right side.          Assessment & Plan:

## 2011-01-28 NOTE — Assessment & Plan Note (Signed)
[name] is developed mild epididymitis. I will check urine studies and put him on empiric Septra.

## 2011-01-29 LAB — URINALYSIS, ROUTINE W REFLEX MICROSCOPIC
Bilirubin Urine: NEGATIVE
Hgb urine dipstick: NEGATIVE
Ketones, ur: NEGATIVE mg/dL
Specific Gravity, Urine: 1.008 (ref 1.005–1.030)
Urobilinogen, UA: 0.2 mg/dL (ref 0.0–1.0)
pH: 6 (ref 5.0–8.0)

## 2011-01-29 LAB — GC/CHLAMYDIA PROBE AMP, URINE: Chlamydia, Swab/Urine, PCR: NEGATIVE

## 2011-01-30 LAB — URINE CULTURE: Organism ID, Bacteria: NO GROWTH

## 2011-02-18 ENCOUNTER — Ambulatory Visit: Payer: Self-pay

## 2011-03-03 ENCOUNTER — Ambulatory Visit: Payer: Self-pay

## 2011-03-04 ENCOUNTER — Other Ambulatory Visit: Payer: Self-pay | Admitting: *Deleted

## 2011-03-04 DIAGNOSIS — E785 Hyperlipidemia, unspecified: Secondary | ICD-10-CM

## 2011-03-04 DIAGNOSIS — I10 Essential (primary) hypertension: Secondary | ICD-10-CM

## 2011-03-04 MED ORDER — ATORVASTATIN CALCIUM 20 MG PO TABS: 20.0000 mg | ORAL_TABLET | Freq: Every day | ORAL | Status: DC

## 2011-03-04 MED ORDER — AMLODIPINE BESYLATE 5 MG PO TABS: 5.0000 mg | ORAL_TABLET | Freq: Every day | ORAL | Status: DC

## 2011-03-04 NOTE — Telephone Encounter (Signed)
Phoned in refill for norvasc & lipitor to [facility]. Will call pt when they come in

## 2011-03-12 ENCOUNTER — Other Ambulatory Visit: Payer: Self-pay | Admitting: *Deleted

## 2011-03-12 DIAGNOSIS — E785 Hyperlipidemia, unspecified: Secondary | ICD-10-CM

## 2011-03-12 DIAGNOSIS — I10 Essential (primary) hypertension: Secondary | ICD-10-CM

## 2011-03-12 MED ORDER — AMLODIPINE BESYLATE 5 MG PO TABS: 5.0000 mg | ORAL_TABLET | Freq: Every day | ORAL | Status: DC

## 2011-03-12 MED ORDER — ATORVASTATIN CALCIUM 20 MG PO TABS: 20.0000 mg | ORAL_TABLET | Freq: Every day | ORAL | Status: DC

## 2011-04-17 ENCOUNTER — Other Ambulatory Visit (INDEPENDENT_AMBULATORY_CARE_PROVIDER_SITE_OTHER): Payer: Self-pay

## 2011-04-17 DIAGNOSIS — Z79899 Other long term (current) drug therapy: Secondary | ICD-10-CM

## 2011-04-17 DIAGNOSIS — B20 Human immunodeficiency virus [HIV] disease: Secondary | ICD-10-CM

## 2011-04-17 LAB — COMPLETE METABOLIC PANEL WITH GFR
ALT: 32 U/L (ref 0–53)
CO2: 27 mEq/L (ref 19–32)
Calcium: 9.5 mg/dL (ref 8.4–10.5)
Chloride: 102 mEq/L (ref 96–112)
Creat: 0.85 mg/dL (ref 0.50–1.35)
GFR, Est African American: >60 mL/min (ref >60)
GFR, Est Non African American: >60 mL/min (ref >60)
Glucose, Bld: 156 mg/dL — ABNORMAL HIGH (ref 70–99)
Sodium: 139 mEq/L (ref 135–145)
Total Bilirubin: 0.6 mg/dL (ref 0.3–1.2)
Total Protein: 7.2 g/dL (ref 6.0–8.3)

## 2011-04-17 LAB — LIPID PANEL
HDL: 27 mg/dL — ABNORMAL LOW (ref >39)
LDL Cholesterol: 38 mg/dL (ref 0–99)
Triglycerides: 343 mg/dL — ABNORMAL HIGH (ref <150)

## 2011-04-18 LAB — HIV-1 RNA QUANT-NO REFLEX-BLD
HIV 1 RNA Quant: <20 copies/mL (ref <20)
HIV-1 RNA Quant, Log: <1.3 {Log} (ref <1.30)

## 2011-04-28 LAB — POCT URINALYSIS DIP (DEVICE)
Bilirubin Urine: NEGATIVE
Glucose, UA: NEGATIVE
Hgb urine dipstick: NEGATIVE
Specific Gravity, Urine: <=1.005
pH: 7

## 2011-04-29 LAB — T-HELPER CELL (CD4) - (RCID CLINIC ONLY): CD4 % Helper T Cell: 44

## 2011-05-01 ENCOUNTER — Ambulatory Visit (INDEPENDENT_AMBULATORY_CARE_PROVIDER_SITE_OTHER): Payer: Self-pay | Admitting: Internal Medicine

## 2011-05-01 ENCOUNTER — Other Ambulatory Visit: Payer: Self-pay | Admitting: *Deleted

## 2011-05-01 ENCOUNTER — Encounter: Payer: Self-pay | Admitting: Internal Medicine

## 2011-05-01 VITALS — BP 138/76 | HR 61 | Temp 97.8°F | Ht 63.0 in | Wt 124.0 lb

## 2011-05-01 DIAGNOSIS — B20 Human immunodeficiency virus [HIV] disease: Secondary | ICD-10-CM

## 2011-05-01 DIAGNOSIS — Z23 Encounter for immunization: Secondary | ICD-10-CM

## 2011-05-01 NOTE — Progress Notes (Signed)
  Subjective:    Patient ID: [name], male    DOB: 14-Sep-1945, 65 y.o.   MRN: [id number]  HPI [name] is in for his routine visit. As usual he never misses doses of his HIV medications. He is doing well without any new concerns. He will be eligible for Medicare next month and will be reestablishing primary care with [facility].    Review of Systems     Objective:   Physical Exam  Constitutional: No distress.  HENT:  Mouth/Throat: No oropharyngeal exudate.  Eyes: Conjunctivae are normal.  Cardiovascular: Normal heart sounds.   No murmur heard. Pulmonary/Chest: Breath sounds normal. He has no wheezes. He has no rales.  Skin: No rash noted.  Psychiatric: He has a normal mood and affect.          Assessment & Plan:

## 2011-05-01 NOTE — Telephone Encounter (Signed)
I called [facility] to refill his 3 meds he gets from them. They will ship them within 7-10 days. I called & told pt this. He states with what he has now & the refills, he will be good thru 12/12. He then goes on Medicare & will not be using PAP. (he expires with pap on 06/04/11

## 2011-05-01 NOTE — Assessment & Plan Note (Signed)
His viral load remains undetectable at less than 20 and her CD4 count remains normal at over 600. I will continue his current regimen. He received his influenza vaccine today.

## 2011-05-14 ENCOUNTER — Telehealth: Payer: Self-pay | Admitting: *Deleted

## 2011-05-14 NOTE — Telephone Encounter (Signed)
Called patient to let him know that his PAP Zoloft was delivered and that he can pick it up at his earliest available time. Had to leave a message for him to call the office as he was not available.

## 2011-05-20 LAB — T-HELPER CELL (CD4) - (RCID CLINIC ONLY): CD4 T Cell Abs: 550

## 2011-05-21 ENCOUNTER — Other Ambulatory Visit: Payer: Self-pay | Admitting: *Deleted

## 2011-05-21 DIAGNOSIS — E785 Hyperlipidemia, unspecified: Secondary | ICD-10-CM

## 2011-05-21 DIAGNOSIS — I10 Essential (primary) hypertension: Secondary | ICD-10-CM

## 2011-05-21 MED ORDER — ATORVASTATIN CALCIUM 20 MG PO TABS: 20.0000 mg | ORAL_TABLET | Freq: Every day | ORAL | Status: DC

## 2011-05-21 MED ORDER — AMLODIPINE BESYLATE 5 MG PO TABS: 5.0000 mg | ORAL_TABLET | Freq: Every day | ORAL | Status: DC

## 2011-05-21 NOTE — Telephone Encounter (Signed)
Unable to reach pt. Phone has been d/c

## 2011-05-22 MED ORDER — ATORVASTATIN CALCIUM 20 MG PO TABS: 20.0000 mg | ORAL_TABLET | Freq: Every day | ORAL | Status: DC

## 2011-05-27 ENCOUNTER — Encounter: Payer: Self-pay | Admitting: *Deleted

## 2011-05-27 ENCOUNTER — Ambulatory Visit (INDEPENDENT_AMBULATORY_CARE_PROVIDER_SITE_OTHER): Payer: Self-pay | Admitting: *Deleted

## 2011-05-27 VITALS — BP 129/75 | HR 56 | Temp 97.7°F | Wt 124.0 lb

## 2011-05-27 DIAGNOSIS — B20 Human immunodeficiency virus [HIV] disease: Secondary | ICD-10-CM

## 2011-05-27 LAB — COMPREHENSIVE METABOLIC PANEL
ALT: 36 U/L (ref 0–53)
AST: 37 U/L (ref 0–37)
Albumin: 5.2 g/dL (ref 3.5–5.2)
CO2: 25 mEq/L (ref 19–32)
Calcium: 9.3 mg/dL (ref 8.4–10.5)
Chloride: 101 mEq/L (ref 96–112)
Creat: 0.8 mg/dL (ref 0.50–1.35)
Potassium: 4.4 mEq/L (ref 3.5–5.3)
Total Protein: 7.1 g/dL (ref 6.0–8.3)

## 2011-05-27 LAB — LIPID PANEL
Cholesterol: 149 mg/dL (ref 0–200)
Total CHOL/HDL Ratio: 5 Ratio

## 2011-05-27 NOTE — Progress Notes (Signed)
05/27/2011 @ 0900: Pt here for research study A5001, week 720. Assessment unchanged since last study visit. Pt seen by Dr. Orvan Falconer in September and pt stated he "asked MD about his Right groin lymph node enlargement that comes and goes and MD was not concerned about it unless it continued to get larger". Fasting labs were drawn; vital signs are stable. Pt received $20.00 gift card for visit. Next research appointment scheduled for Monday, 09/15/2011 @ 090. -- Tacey Heap RN

## 2011-05-28 LAB — PROTEIN, URINE, RANDOM: Total Protein, Urine: 8 mg/dL

## 2011-05-28 LAB — CREATININE, URINE, RANDOM: Creatinine, Urine: 40.4 mg/dL

## 2011-06-04 ENCOUNTER — Other Ambulatory Visit: Payer: Self-pay | Admitting: *Deleted

## 2011-06-04 NOTE — Telephone Encounter (Signed)
Pt is no longer taking zoloft.  Called him to get his norvasc & lipitor. States he will come get it

## 2011-07-01 ENCOUNTER — Other Ambulatory Visit: Payer: Self-pay | Admitting: *Deleted

## 2011-07-01 DIAGNOSIS — I10 Essential (primary) hypertension: Secondary | ICD-10-CM

## 2011-07-01 MED ORDER — ATENOLOL 50 MG PO TABS: 50.0000 mg | ORAL_TABLET | Freq: Every day | ORAL | Status: DC

## 2011-07-01 MED ORDER — LISINOPRIL-HYDROCHLOROTHIAZIDE 20-25 MG PO TABS: 2.0000 | ORAL_TABLET | Freq: Every day | ORAL | Status: DC

## 2011-07-11 ENCOUNTER — Encounter: Payer: Self-pay | Admitting: Internal Medicine

## 2011-07-11 LAB — CD4/CD8 (T-HELPER/T-SUPPRESSOR CELL)
CD4%: 43.5
CD4: 522
CD8 % Suppressor T Cell: 38.4
CD8: 461

## 2011-08-07 ENCOUNTER — Other Ambulatory Visit: Payer: Self-pay | Admitting: Internal Medicine

## 2011-08-07 DIAGNOSIS — E7800 Pure hypercholesterolemia, unspecified: Secondary | ICD-10-CM

## 2011-08-20 ENCOUNTER — Ambulatory Visit: Payer: Self-pay

## 2011-08-21 ENCOUNTER — Encounter: Payer: Self-pay | Admitting: Internal Medicine

## 2011-08-21 ENCOUNTER — Ambulatory Visit (INDEPENDENT_AMBULATORY_CARE_PROVIDER_SITE_OTHER): Payer: Medicare Other | Admitting: Internal Medicine

## 2011-08-21 ENCOUNTER — Telehealth: Payer: Self-pay | Admitting: Internal Medicine

## 2011-08-21 DIAGNOSIS — E1151 Type 2 diabetes mellitus with diabetic peripheral angiopathy without gangrene: Secondary | ICD-10-CM

## 2011-08-21 DIAGNOSIS — E119 Type 2 diabetes mellitus without complications: Secondary | ICD-10-CM

## 2011-08-21 DIAGNOSIS — E1159 Type 2 diabetes mellitus with other circulatory complications: Secondary | ICD-10-CM | POA: Diagnosis not present

## 2011-08-21 DIAGNOSIS — I2510 Atherosclerotic heart disease of native coronary artery without angina pectoris: Secondary | ICD-10-CM

## 2011-08-21 DIAGNOSIS — I798 Other disorders of arteries, arterioles and capillaries in diseases classified elsewhere: Secondary | ICD-10-CM | POA: Diagnosis not present

## 2011-08-21 DIAGNOSIS — E785 Hyperlipidemia, unspecified: Secondary | ICD-10-CM

## 2011-08-21 MED ORDER — FENOFIBRATE 145 MG PO TABS: 145.0000 mg | ORAL_TABLET | Freq: Every day | ORAL | Status: DC

## 2011-08-21 NOTE — Patient Instructions (Signed)
Please schedule chem7, a1c (250.0) and lipid/lft (272.4) prior to next visit 

## 2011-08-21 NOTE — Telephone Encounter (Signed)
Future lab order placed and copy has been forwarded to the lab.

## 2011-08-22 LAB — CBC WITH DIFFERENTIAL/PLATELET
Basophils Absolute: 0 10*3/uL (ref 0.0–0.1)
Eosinophils Relative: 3 % (ref 0–5)
Lymphocytes Relative: 29 % (ref 12–46)
MCV: 119.5 fL — ABNORMAL HIGH (ref 78.0–100.0)
Neutrophils Relative %: 57 % (ref 43–77)
Platelets: 244 10*3/uL (ref 150–400)
RBC: 3.18 MIL/uL — ABNORMAL LOW (ref 4.22–5.81)
RDW: 14.2 % (ref 11.5–15.5)
WBC: 4.4 10*3/uL (ref 4.0–10.5)

## 2011-08-22 LAB — HEMOGLOBIN A1C: Hgb A1c MFr Bld: 7.1 % — ABNORMAL HIGH (ref <5.7)

## 2011-08-22 LAB — MICROALBUMIN / CREATININE URINE RATIO: Microalb, Ur: 0.69 mg/dL (ref 0.00–1.89)

## 2011-08-23 NOTE — Progress Notes (Signed)
  Subjective:    Patient ID: [name], male    DOB: November 26, 1945, 66 y.o.   MRN: [id number]  HPI Pt presents to clinic to establish care and for follow up of multiple medical problems. Known h/o HIV followed by ID with undetectable viral load and CD4>600 last check. Chart review indicates h/o inguinal adenopathy and pt states is intermittent and not persistent. No current evidence of gu infxn. H/o CAD s/p cabg without cp or dyspnea. No recent cardiology evaluation. Tolerates statin tx without myalgias or abn lft. Finds lopid expensive. H/o DM and does not have a glucometer. Last known a1c 5.8. Colonoscopy 8/08 nl with 10y follow up. No other complaints.  Past Medical History  Diagnosis Date  . Arthritis   . Blood in stool   . History of depression   . History of diabetes mellitus   . GERD (gastroesophageal reflux disease)   . Heart disease     2003- cabg  . Hypertension   . Hyperlipidemia   . History of kidney stones    Past Surgical History  Procedure Date  . Coronary artery bypass graft 05/2002  . Vasectomy     reports that he has never smoked. He has never used smokeless tobacco. He reports that he does not drink alcohol or use illicit drugs. family history includes Arthritis in an unspecified family member; Breast cancer in his maternal aunt; Diabetes in his mother; Heart disease in an unspecified family member; Hyperlipidemia in his father and mother; Hypertension in his father; Lung cancer in his maternal aunt; Mental retardation in his sister; and Stroke in his paternal grandmother. No Known Allergies   Review of Systems  Respiratory: Negative for shortness of breath.   Cardiovascular: Negative for chest pain.  Neurological: Negative for numbness.  All other systems reviewed and are negative.       Objective:   Physical Exam  Physical Exam  Nursing note and vitals reviewed. Constitutional: Appears well-developed and well-nourished. No distress.  HENT:  Head:  Normocephalic and atraumatic.  Right Ear: External ear normal.  Left Ear: External ear normal.  Eyes: Conjunctivae are normal. No scleral icterus.  Neck: Neck supple. Carotid bruit is not present.  Cardiovascular: Normal rate, regular rhythm and normal heart sounds.  Exam reveals no gallop and no friction rub.   No murmur heard. Pulmonary/Chest: Effort normal and breath sounds normal. No respiratory distress. He has no wheezes. no rales.  Lymphadenopathy:    He has no cervical adenopathy.  Neurological:Alert.  Skin: Skin is warm and dry. Not diaphoretic.  Psychiatric: Has a normal mood and affect.  Diabetic foot exam: +2 DP pulses, no diabetic wounds, ulcerations or significant callousing. Monofilament exam nl.       Assessment & Plan:

## 2011-08-23 NOTE — Assessment & Plan Note (Signed)
Asx. Schedule cardiology follow up

## 2011-08-23 NOTE — Assessment & Plan Note (Signed)
Change lopid to fenofibrate

## 2011-08-23 NOTE — Assessment & Plan Note (Signed)
Obtain cbc, a1c, urine microalbumin. Diabetic eye exam pending.

## 2011-08-29 ENCOUNTER — Other Ambulatory Visit: Payer: Self-pay | Admitting: Internal Medicine

## 2011-09-04 ENCOUNTER — Telehealth: Payer: Self-pay | Admitting: *Deleted

## 2011-09-04 NOTE — Telephone Encounter (Signed)
Pt called with fasting glucometer readings:  1/24  139 1/25  131 1/26  151 1/27  153 1/28  118 1/29  131 1/30  155 1/31  181  Pt not currently on diabetic meds.  Please advise.

## 2011-09-04 NOTE — Telephone Encounter (Signed)
Try metformin 500mg  po qam if no interactions.

## 2011-09-05 MED ORDER — METFORMIN HCL 500 MG PO TABS: 500.0000 mg | ORAL_TABLET | Freq: Every day | ORAL | Status: DC

## 2011-09-05 NOTE — Telephone Encounter (Signed)
Call placed to patient at [phone], he was informed per Dr Rodena Medin instructions, denies allergies, and was advised to call back if he was unable to tolerated Metformin. Patient verbalized understanding and agrees as instructed.

## 2011-09-08 ENCOUNTER — Telehealth: Payer: Self-pay | Admitting: *Deleted

## 2011-09-08 NOTE — Telephone Encounter (Signed)
Ok to rf 6

## 2011-09-08 NOTE — Telephone Encounter (Signed)
[facility] Triage Call Report Triage Record Num: [id number] Operator: Albertine Grates Patient Name: [name] Call Date & Time: 09/05/2011 7:43:59PM Patient Phone: [phone] PCP: Marguarite Arbour Patient Gender: Male PCP Fax : [phone] Patient DOB: 1946-03-13 Practice Name: [facility] Reason for Call: Caller: Gwen/Patient; PCP: Marguarite Arbour; CB#: [phone]; Call regarding Medication Issue; Medication(s): gemfibrozil 600MG ; States discussed with MD changing pt. to a med that would be cheaper than Gemfibrozil. Took to pharmacy and new medicine was going to cost $54. Gemfibrozil only costs $11. Does not have enough to last through week end. Per EPIC, was changed to Tricor 145mg  daily. Dr. Fabian Sharp notified and advised can call in Lopid 600mg  po bid #60 and is to follow up with MD 2-2 for additional medicine. Med called to [facility]/[place]/[phone]. Protocol(s) Used: Office Note Recommended Outcome per Protocol: Information Noted and Sent to Office Reason for Outcome: Caller information to office

## 2011-09-09 NOTE — Telephone Encounter (Signed)
Left detailed message on home phone to call tomorrow and verify that he is requesting additional refills and we can send them to the pharmacy.

## 2011-09-10 ENCOUNTER — Ambulatory Visit (INDEPENDENT_AMBULATORY_CARE_PROVIDER_SITE_OTHER): Payer: Medicare Other | Admitting: Cardiology

## 2011-09-10 ENCOUNTER — Encounter: Payer: Self-pay | Admitting: Cardiology

## 2011-09-10 VITALS — BP 115/80 | HR 60 | Ht 62.0 in | Wt 127.0 lb

## 2011-09-10 DIAGNOSIS — I10 Essential (primary) hypertension: Secondary | ICD-10-CM

## 2011-09-10 DIAGNOSIS — E785 Hyperlipidemia, unspecified: Secondary | ICD-10-CM

## 2011-09-10 DIAGNOSIS — I2510 Atherosclerotic heart disease of native coronary artery without angina pectoris: Secondary | ICD-10-CM | POA: Diagnosis not present

## 2011-09-10 MED ORDER — GEMFIBROZIL 600 MG PO TABS: 600.0000 mg | ORAL_TABLET | Freq: Two times a day (BID) | ORAL | Status: DC

## 2011-09-10 NOTE — Telephone Encounter (Signed)
Spoke with pt, refill sent to pharmacy

## 2011-09-10 NOTE — Assessment & Plan Note (Signed)
Continue aspirin and statin. Schedule Myoview for risk stratification. 

## 2011-09-10 NOTE — Progress Notes (Signed)
HPI: Pleasant male for evaluation of coronary artery disease. Patient is status post coronary artery bypass and graft in 2003. Last Myoview was performed in December of 2009. This revealed an ejection fraction of 62%. There was a small defect in the anterior wall consistent with small prior infarct versus soft tissue attenuation. No ischemia. Echocardiogram in December of 2011 revealed normal LV function, mild left ventricular hypertrophy and grade 2 diastolic dysfunction. He was last seen on 08/07/2008. Since he was last seen, the patient has dyspnea with more extreme activities but not with routine activities. It is relieved with rest. It is not associated with chest pain. There is no orthopnea, PND or pedal edema. There is no syncope or palpitations. There is no exertional chest pain.   Current Outpatient Prescriptions  Medication Sig Dispense Refill  . amLODipine (NORVASC) 5 MG tablet Take 1 tablet (5 mg total) by mouth daily.  90 tablet  3  . aspirin 325 MG tablet Take 325 mg by mouth daily.        Marland Kitchen atenolol (TENORMIN) 50 MG tablet Take 1 tablet (50 mg total) by mouth daily.  30 tablet  3  . gemfibrozil (LOPID) 600 MG tablet Take 600 mg by mouth 2 (two) times daily before a meal.      . lamiVUDine-zidovudine (COMBIVIR) 150-300 MG per tablet TAKE 1 TABLET TWICE A DAY  60 tablet  0  . lisinopril-hydrochlorothiazide (PRINZIDE,ZESTORETIC) 20-25 MG per tablet TAKE 2 TABLETS BY MOUTH EVERY DAY  60 tablet  0  . metFORMIN (GLUCOPHAGE) 500 MG tablet Take 1 tablet (500 mg total) by mouth daily with breakfast.  30 tablet  3  . nevirapine (VIRAMUNE) 200 MG tablet Take 1 tablet (200 mg total) by mouth 2 (two) times daily.  60 tablet  11  . omeprazole (PRILOSEC) 20 MG capsule Take 1 capsule (20 mg total) by mouth daily.  30 capsule  11  . valACYclovir (VALTREX) 500 MG tablet as needed.       Marland Kitchen DISCONTD: valACYclovir (VALTREX) 500 MG tablet TAKE 1 TABLET BY MOUTH TWICE DAILY  60 tablet  2     Past  Medical History  Diagnosis Date  . Arthritis   . Diabetes mellitus   . History of depression   . GERD (gastroesophageal reflux disease)   . CAD (coronary artery disease)     2003- cabg  . Hypertension   . Hyperlipidemia   . History of kidney stones   . COPD (chronic obstructive pulmonary disease)   . HIV (human immunodeficiency virus infection)     Past Surgical History  Procedure Date  . Coronary artery bypass graft 05/2002  . Vasectomy     History   Social History  . Marital Status: Divorced    Spouse Name: N/A    Number of Children: 4  . Years of Education: N/A   Occupational History  .      Disability   Social History Main Topics  . Smoking status: Never Smoker   . Smokeless tobacco: Never Used  . Alcohol Use: No  . Drug Use: No  . Sexually Active: Not Currently     declined condoms   Other Topics Concern  . Not on file   Social History Narrative  . No narrative on file    ROS: Recent hematochezia but no fevers or chills, productive cough, hemoptysis, dysphasia, odynophagia, melena, dysuria, hematuria, rash, seizure activity, orthopnea, PND, pedal edema, claudication. Remaining systems are negative.  Physical Exam:  Well-developed well-nourished in no acute distress.  Skin is warm and dry.  HEENT is normal. Normal eyelids. Back is normal. Neck is supple. No thyromegaly. No bruits. No thyromegaly. Chest is clear to auscultation with normal expansion. Status post sternotomy Cardiovascular exam is regular rate and rhythm. 2/6 systolic murmur left sternal border. S2 is not diminished. Abdominal exam nontender or distended. No masses palpated. Ventral and umbilical hernia. 2+ femoral with no bruits. Extremities show no edema. 2+ DP bilaterally neuro grossly intact  ECG sinus rhythm at a rate of 60. Left ventricular hypertrophy. Nonspecific ST changes. Cannot rule out prior inferior infarct.

## 2011-09-10 NOTE — Assessment & Plan Note (Signed)
Continue statin. Lipids and liver monitored by primary care. 

## 2011-09-10 NOTE — Assessment & Plan Note (Signed)
Blood pressure controlled. Continue present medications. Potassium and renal function monitored by primary care. 

## 2011-09-10 NOTE — Patient Instructions (Signed)
Your physician wants you to follow-up in: ONE YEAR You will receive a reminder letter in the mail two months in advance. If you don't receive a letter, please call our office to schedule the follow-up appointment.   Your physician has requested that you have a lexiscan myoview. For further information please visit [email or web address]. Please follow instruction sheet, as given.   

## 2011-09-11 ENCOUNTER — Other Ambulatory Visit: Payer: Self-pay | Admitting: Internal Medicine

## 2011-09-11 DIAGNOSIS — E785 Hyperlipidemia, unspecified: Secondary | ICD-10-CM

## 2011-09-11 MED ORDER — FENOFIBRATE 160 MG PO TABS: 160.0000 mg | ORAL_TABLET | Freq: Every day | ORAL | Status: DC

## 2011-09-15 ENCOUNTER — Ambulatory Visit (INDEPENDENT_AMBULATORY_CARE_PROVIDER_SITE_OTHER): Payer: Medicare Other | Admitting: *Deleted

## 2011-09-15 VITALS — BP 111/70 | HR 66 | Temp 97.5°F | Wt 124.5 lb

## 2011-09-15 DIAGNOSIS — B20 Human immunodeficiency virus [HIV] disease: Secondary | ICD-10-CM

## 2011-09-15 DIAGNOSIS — E785 Hyperlipidemia, unspecified: Secondary | ICD-10-CM

## 2011-09-15 LAB — LIPID PANEL
Cholesterol: 129 mg/dL (ref 0–200)
HDL: 29 mg/dL — ABNORMAL LOW (ref >39)
Total CHOL/HDL Ratio: 4.4 Ratio

## 2011-09-15 LAB — COMPREHENSIVE METABOLIC PANEL
Albumin: 4.6 g/dL (ref 3.5–5.2)
BUN: 19 mg/dL (ref 6–23)
CO2: 24 mEq/L (ref 19–32)
Calcium: 9.4 mg/dL (ref 8.4–10.5)
Chloride: 104 mEq/L (ref 96–112)
Creat: 0.82 mg/dL (ref 0.50–1.35)
Glucose, Bld: 175 mg/dL — ABNORMAL HIGH (ref 70–99)

## 2011-09-15 LAB — CD4/CD8 (T-HELPER/T-SUPPRESSOR CELL): CD8 % Suppressor T Cell: 31.7

## 2011-09-15 LAB — HIV-1 RNA QUANT-NO REFLEX-BLD: HIV-1 RNA Viral Load: <40

## 2011-09-15 NOTE — Progress Notes (Signed)
[name] was here for his week 736 ALLRT study visit. He was informed that he will have one more visit before the study ends, which will be in May. He denies any new problems or concerns. 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

## 2011-09-23 ENCOUNTER — Ambulatory Visit (HOSPITAL_COMMUNITY): Payer: Medicare Other | Attending: Cardiology | Admitting: Radiology

## 2011-09-23 VITALS — BP 115/71 | Ht 62.5 in | Wt 123.0 lb

## 2011-09-23 DIAGNOSIS — Z8249 Family history of ischemic heart disease and other diseases of the circulatory system: Secondary | ICD-10-CM | POA: Insufficient documentation

## 2011-09-23 DIAGNOSIS — J449 Chronic obstructive pulmonary disease, unspecified: Secondary | ICD-10-CM | POA: Diagnosis not present

## 2011-09-23 DIAGNOSIS — Z951 Presence of aortocoronary bypass graft: Secondary | ICD-10-CM | POA: Insufficient documentation

## 2011-09-23 DIAGNOSIS — I10 Essential (primary) hypertension: Secondary | ICD-10-CM | POA: Insufficient documentation

## 2011-09-23 DIAGNOSIS — I2510 Atherosclerotic heart disease of native coronary artery without angina pectoris: Secondary | ICD-10-CM | POA: Diagnosis not present

## 2011-09-23 DIAGNOSIS — R9431 Abnormal electrocardiogram [ECG] [EKG]: Secondary | ICD-10-CM | POA: Insufficient documentation

## 2011-09-23 DIAGNOSIS — J4489 Other specified chronic obstructive pulmonary disease: Secondary | ICD-10-CM | POA: Insufficient documentation

## 2011-09-23 DIAGNOSIS — R0602 Shortness of breath: Secondary | ICD-10-CM | POA: Insufficient documentation

## 2011-09-23 DIAGNOSIS — E119 Type 2 diabetes mellitus without complications: Secondary | ICD-10-CM | POA: Diagnosis not present

## 2011-09-23 DIAGNOSIS — E785 Hyperlipidemia, unspecified: Secondary | ICD-10-CM | POA: Insufficient documentation

## 2011-09-23 MED ORDER — REGADENOSON 0.4 MG/5ML IV SOLN
0.4000 mg | Freq: Once | INTRAVENOUS | Status: AC
  Administered 2011-09-23: 0.4 mg via INTRAVENOUS

## 2011-09-23 MED ORDER — TECHNETIUM TC 99M TETROFOSMIN IV KIT
10.0000 | PACK | Freq: Once | INTRAVENOUS | Status: AC | PRN
  Administered 2011-09-23: 10 via INTRAVENOUS

## 2011-09-23 MED ORDER — TECHNETIUM TC 99M TETROFOSMIN IV KIT
30.0000 | PACK | Freq: Once | INTRAVENOUS | Status: AC | PRN
  Administered 2011-09-23: 30 via INTRAVENOUS

## 2011-09-23 NOTE — Progress Notes (Signed)
[facility] SITE 3 NUCLEAR MED [address] [place] [address] [phone]  Cardiology Nuclear Med Study  [name] is a 66 y.o. male [id number] 1945-12-16   Nuclear Med Background Indication for Stress Test:  Evaluation for Ischemia and Graft Patency History: '03 Heart Catheterization, '03 CABG, 12/09 MPS: EF: 62% (-) ischemia, 05/10 ECHO: EF:55%, COPD, Abnormal EKG Cardiac Risk Factors: Family History - CAD, Hypertension, Lipids and NIDDM  Symptoms:  SOB   Nuclear Pre-Procedure Caffeine/Decaff Intake:  None NPO After: 8:00pm   Lungs:  clear IV 0.9% NS with Angio Cath:  20g  IV Site: R Antecubital  IV Started by:  Stanton Kidney, EMT-P  Chest Size (in):  38 Cup Size: n/a  Height: 5' 2.5" (1.588 m)  Weight:  123 lb (55.792 kg)  BMI:  Body mass index is 22.14 kg/(m^2). Tech Comments:  Atenolol held > 24 hours, per patient.    Nuclear Med Study 1 or 2 day study: 1 day  Stress Test Type:  Treadmill/Lexiscan  Reading MD: Olga Millers, MD  Order Authorizing Provider:  B.Daviana Haymaker  Resting Radionuclide: Technetium 60m Tetrofosmin  Resting Radionuclide Dose: 10.8 mCi   Stress Radionuclide:  Technetium 33m Tetrofosmin  Stress Radionuclide Dose: 33.0 mCi           Stress Protocol Rest HR: 62 Stress HR: 107  Rest BP: 115/71 Stress BP: 134/86  Exercise Time (min): n/a METS: n/a   Predicted Max HR: 157 bpm % Max HR: 68.15 bpm Rate Pressure Product: 78469   Dose of Adenosine (mg):  n/a Dose of Lexiscan: 0.4 mg  Dose of Atropine (mg): n/a Dose of Dobutamine: n/a mcg/kg/min (at max HR)  Stress Test Technologist: Milana Na, EMT-P  Nuclear Technologist:  Doyne Keel, CNMT     Rest Procedure:  Myocardial perfusion imaging was performed at rest 45 minutes following the intravenous administration of Technetium 66m Tetrofosmin. Rest ECG: NSR  Stress Procedure:  The patient received IV Lexiscan 0.4 mg over 15-seconds with concurrent low level exercise  and then Technetium 24m Tetrofosmin was injected at 30-seconds while the patient continued walking one more minute.  There were no significant changes, lt. Headed, and chest discomfort with Lexiscan.  Quantitative spect images were obtained after a 45-minute delay. Stress ECG: No significant ST segment change suggestive of ischemia.  QPS Raw Data Images:  Acquisition technically good; normal left ventricular size. Stress Images:  Normal homogeneous uptake in all areas of the myocardium. Rest Images:  Normal homogeneous uptake in all areas of the myocardium. Subtraction (SDS):  No evidence of ischemia. Transient Ischemic Dilatation (Normal <1.22):  0.89 Lung/Heart Ratio (Normal <0.45):  0.33  Quantitative Gated Spect Images QGS EDV:  76 ml QGS ESV:  25 ml QGS cine images:  NL LV Function; NL Wall Motion QGS EF: 67%  Impression Exercise Capacity:  Lexiscan with no exercise. BP Response:  Normal blood pressure response. Clinical Symptoms:  There is chest pain. ECG Impression:  No significant ST segment change suggestive of ischemia. Comparison with Prior Nuclear Study: No significant change from previous study  Overall Impression:  Normal stress nuclear study.   Olga Millers

## 2011-10-02 ENCOUNTER — Telehealth: Payer: Self-pay | Admitting: Internal Medicine

## 2011-10-02 MED ORDER — FREESTYLE LANCETS MISC: Status: DC

## 2011-10-02 MED ORDER — GLUCOSE BLOOD VI STRP: ORAL_STRIP | Status: DC

## 2011-10-02 NOTE — Telephone Encounter (Signed)
Patient received a Free Style Glucometer  Needs testing supplies sent to [facility]

## 2011-10-02 NOTE — Telephone Encounter (Signed)
Rx sent to pharmacy   

## 2011-10-16 ENCOUNTER — Encounter: Payer: Self-pay | Admitting: Internal Medicine

## 2011-11-01 ENCOUNTER — Other Ambulatory Visit: Payer: Self-pay | Admitting: Internal Medicine

## 2011-11-03 ENCOUNTER — Other Ambulatory Visit: Payer: Self-pay | Admitting: *Deleted

## 2011-11-03 DIAGNOSIS — B20 Human immunodeficiency virus [HIV] disease: Secondary | ICD-10-CM

## 2011-11-03 MED ORDER — LAMIVUDINE-ZIDOVUDINE 150-300 MG PO TABS: 1.0000 | ORAL_TABLET | Freq: Two times a day (BID) | ORAL | Status: DC

## 2011-11-03 MED ORDER — NEVIRAPINE 200 MG PO TABS: 200.0000 mg | ORAL_TABLET | Freq: Two times a day (BID) | ORAL | Status: DC

## 2011-11-05 ENCOUNTER — Other Ambulatory Visit: Payer: Self-pay | Admitting: *Deleted

## 2011-11-05 DIAGNOSIS — K219 Gastro-esophageal reflux disease without esophagitis: Secondary | ICD-10-CM

## 2011-11-05 MED ORDER — OMEPRAZOLE 20 MG PO CPDR: 20.0000 mg | DELAYED_RELEASE_CAPSULE | Freq: Every day | ORAL | Status: DC

## 2011-11-10 ENCOUNTER — Other Ambulatory Visit: Payer: Self-pay | Admitting: *Deleted

## 2011-11-10 DIAGNOSIS — E785 Hyperlipidemia, unspecified: Secondary | ICD-10-CM | POA: Diagnosis not present

## 2011-11-10 DIAGNOSIS — E119 Type 2 diabetes mellitus without complications: Secondary | ICD-10-CM | POA: Diagnosis not present

## 2011-11-10 LAB — HEPATIC FUNCTION PANEL
ALT: 26 U/L (ref 0–53)
Bilirubin, Direct: 0.1 mg/dL (ref 0.0–0.3)
Indirect Bilirubin: 0.4 mg/dL (ref 0.0–0.9)
Total Bilirubin: 0.5 mg/dL (ref 0.3–1.2)

## 2011-11-10 LAB — BASIC METABOLIC PANEL
Calcium: 9.5 mg/dL (ref 8.4–10.5)
Potassium: 4.1 mEq/L (ref 3.5–5.3)
Sodium: 141 mEq/L (ref 135–145)

## 2011-11-10 LAB — LIPID PANEL
Cholesterol: 140 mg/dL (ref 0–200)
HDL: 31 mg/dL — ABNORMAL LOW (ref >39)
LDL Cholesterol: 60 mg/dL (ref 0–99)
Total CHOL/HDL Ratio: 4.5 Ratio
Triglycerides: 245 mg/dL — ABNORMAL HIGH (ref <150)
VLDL: 49 mg/dL — ABNORMAL HIGH (ref 0–40)

## 2011-11-10 LAB — HEMOGLOBIN A1C
Hgb A1c MFr Bld: 5.6 % (ref <5.7)
Mean Plasma Glucose: 114 mg/dL (ref <117)

## 2011-11-19 ENCOUNTER — Ambulatory Visit: Payer: Medicare Other | Admitting: Internal Medicine

## 2011-11-20 ENCOUNTER — Telehealth: Payer: Self-pay | Admitting: Internal Medicine

## 2011-11-20 ENCOUNTER — Encounter: Payer: Self-pay | Admitting: Internal Medicine

## 2011-11-20 ENCOUNTER — Ambulatory Visit (INDEPENDENT_AMBULATORY_CARE_PROVIDER_SITE_OTHER): Payer: Medicare Other | Admitting: Internal Medicine

## 2011-11-20 VITALS — BP 94/60 | HR 58 | Temp 97.7°F | Wt 119.0 lb

## 2011-11-20 DIAGNOSIS — I10 Essential (primary) hypertension: Secondary | ICD-10-CM | POA: Diagnosis not present

## 2011-11-20 DIAGNOSIS — E119 Type 2 diabetes mellitus without complications: Secondary | ICD-10-CM

## 2011-11-20 DIAGNOSIS — E785 Hyperlipidemia, unspecified: Secondary | ICD-10-CM | POA: Diagnosis not present

## 2011-11-20 MED ORDER — LISINOPRIL-HYDROCHLOROTHIAZIDE 20-25 MG PO TABS: 2.0000 | ORAL_TABLET | Freq: Every day | ORAL | Status: DC

## 2011-11-20 MED ORDER — ATENOLOL 50 MG PO TABS: 50.0000 mg | ORAL_TABLET | Freq: Every day | ORAL | Status: DC

## 2011-11-20 MED ORDER — METFORMIN HCL 500 MG PO TABS: 500.0000 mg | ORAL_TABLET | Freq: Two times a day (BID) | ORAL | Status: DC

## 2011-11-20 MED ORDER — ATORVASTATIN CALCIUM 20 MG PO TABS: 20.0000 mg | ORAL_TABLET | Freq: Every day | ORAL | Status: DC

## 2011-11-20 MED ORDER — AMLODIPINE BESYLATE 2.5 MG PO TABS: 2.5000 mg | ORAL_TABLET | Freq: Every day | ORAL | Status: DC

## 2011-11-20 NOTE — Telephone Encounter (Signed)
Lab order entered for July 2013. 

## 2011-11-20 NOTE — Patient Instructions (Signed)
Please schedule labs prior to next visit Cbc, chem7, a1c-250.0 

## 2011-11-23 NOTE — Assessment & Plan Note (Addendum)
Disconnect b/t a1c and fsbs. Glucometer felt to be accurate. Increase metformin 500mg  bid.

## 2011-11-23 NOTE — Assessment & Plan Note (Signed)
Decrease norvasc 2.5mg  qd. Monitor bp as outpt

## 2011-11-23 NOTE — Progress Notes (Signed)
  Subjective:    Patient ID: [name], male    DOB: 11-24-1945, 66 y.o.   MRN: [id number]  HPI Pt presents to clinic for followup of multiple medical problems. BP reviewed low nl without dizziness. A1c improved to normal but fsbs range 119-171 without hypoglycemia. Tolerates statin tx without myalgia or abn lft. Continues to follow with ID for h/o HIV.   Past Medical History  Diagnosis Date  . Arthritis   . Diabetes mellitus   . History of depression   . GERD (gastroesophageal reflux disease)   . CAD (coronary artery disease)     2003- cabg  . Hypertension   . Hyperlipidemia   . History of kidney stones   . COPD (chronic obstructive pulmonary disease)   . HIV (human immunodeficiency virus infection)    Past Surgical History  Procedure Date  . Coronary artery bypass graft 05/2002  . Vasectomy     reports that he has never smoked. He has never used smokeless tobacco. He reports that he does not drink alcohol or use illicit drugs. family history includes Arthritis in an unspecified family member; Breast cancer in his maternal aunt; Diabetes in his mother; Heart disease in an unspecified family member; Hyperlipidemia in his father and mother; Hypertension in his father; Lung cancer in his maternal aunt; Mental retardation in his sister; and Stroke in his paternal grandmother. No Known Allergies    Review of Systems see hpi     Objective:   Physical Exam Physical Exam  Nursing note and vitals reviewed. Constitutional: Appears well-developed and well-nourished. No distress.  HENT:  Head: Normocephalic and atraumatic.  Right Ear: External ear normal.  Left Ear: External ear normal.  Eyes: Conjunctivae are normal. No scleral icterus.  Neck: Neck supple. Carotid bruit is not present.  Cardiovascular: Normal rate, regular rhythm and normal heart sounds.  Exam reveals no gallop and no friction rub.   No murmur heard. Pulmonary/Chest: Effort normal and breath sounds normal. No  respiratory distress. He has no wheezes. no rales.  Lymphadenopathy:    He has no cervical adenopathy.  Neurological:Alert.  Skin: Skin is warm and dry. Not diaphoretic.  Psychiatric: Has a normal mood and affect.         Assessment & Plan:

## 2011-11-23 NOTE — Assessment & Plan Note (Signed)
Stable. Continue statin tx. Refill lipitor

## 2011-12-11 ENCOUNTER — Encounter: Payer: Self-pay | Admitting: Internal Medicine

## 2011-12-12 ENCOUNTER — Telehealth: Payer: Self-pay | Admitting: *Deleted

## 2011-12-12 NOTE — Telephone Encounter (Signed)
Reconciliation of medication see my chart message 12/12/2011.

## 2011-12-17 ENCOUNTER — Ambulatory Visit (INDEPENDENT_AMBULATORY_CARE_PROVIDER_SITE_OTHER): Payer: Medicare Other | Admitting: *Deleted

## 2011-12-17 ENCOUNTER — Encounter: Payer: Self-pay | Admitting: *Deleted

## 2011-12-17 VITALS — BP 107/66 | HR 67 | Temp 97.8°F | Wt 124.0 lb

## 2011-12-17 DIAGNOSIS — E785 Hyperlipidemia, unspecified: Secondary | ICD-10-CM

## 2011-12-17 DIAGNOSIS — B20 Human immunodeficiency virus [HIV] disease: Secondary | ICD-10-CM

## 2011-12-17 LAB — COMPREHENSIVE METABOLIC PANEL
ALT: 25 U/L (ref 0–53)
CO2: 26 mEq/L (ref 19–32)
Potassium: 4 mEq/L (ref 3.5–5.3)
Sodium: 140 mEq/L (ref 135–145)
Total Bilirubin: 0.5 mg/dL (ref 0.3–1.2)
Total Protein: 6.7 g/dL (ref 6.0–8.3)

## 2011-12-17 LAB — PROTEIN, URINE, RANDOM: Total Protein, Urine: 3 mg/dL

## 2011-12-17 LAB — CD4/CD8 (T-HELPER/T-SUPPRESSOR CELL): CD4: 593

## 2011-12-17 LAB — LIPID PANEL
Cholesterol: 118 mg/dL (ref 0–200)
LDL Cholesterol: 60 mg/dL (ref 0–99)
VLDL: 29 mg/dL (ref 0–40)

## 2011-12-17 NOTE — Progress Notes (Signed)
Addended by: Nicolasa Ducking on: 12/17/2011 11:30 AM   Modules accepted: Orders

## 2011-12-17 NOTE — Progress Notes (Signed)
Pt here for A5001, week 752/Final Study Visit. Assessment unchanged since last study visit. Pt states he is feeling well. Change in non-ARV medications as of 11/20/11. Pt states he has not missed any doses of ARV meds. Fasting labs were drawn; Vital signs stable. Pt received $20.00 gift card for visit. Currently he is not eligible for any open studies but will keep him in mind if a study does open he could be eligible for. Pt has an appointment with Dr. Orvan Falconer on 01/01/12. Tacey Heap RN

## 2012-01-01 ENCOUNTER — Ambulatory Visit (INDEPENDENT_AMBULATORY_CARE_PROVIDER_SITE_OTHER): Payer: Medicare Other | Admitting: Internal Medicine

## 2012-01-01 ENCOUNTER — Encounter: Payer: Self-pay | Admitting: Internal Medicine

## 2012-01-01 VITALS — BP 116/70 | HR 67 | Temp 98.3°F | Ht 62.5 in | Wt 118.8 lb

## 2012-01-01 DIAGNOSIS — N529 Male erectile dysfunction, unspecified: Secondary | ICD-10-CM | POA: Insufficient documentation

## 2012-01-01 DIAGNOSIS — B20 Human immunodeficiency virus [HIV] disease: Secondary | ICD-10-CM | POA: Diagnosis not present

## 2012-01-01 HISTORY — DX: Male erectile dysfunction, unspecified: N52.9

## 2012-01-01 NOTE — Progress Notes (Signed)
Patient ID: [name], male   DOB: 1945-10-25, 66 y.o.   MRN: [id number]     [facility]  Patient Active Problem List  Diagnoses  . HIV INFECTION  . GENITAL HERPES  . DIABETES MELLITUS, TYPE II  . DYSLIPIDEMIA  . DEPRESSION  . HEARING LOSS, SENSORINEURAL  . HYPERTENSION  . CORONARY ARTERY DISEASE  . PERIPHERAL VASCULAR DISEASE  . COPD  . GERD  . HEMATOCHEZIA  . HIP PAIN, BILATERAL  . KNEE PAIN, BILATERAL  . LATERAL EPICONDYLITIS, LEFT  . MEMORY LOSS  . WEIGHT LOSS, ABNORMAL  . INGUINAL LYMPHADENOPATHY, RIGHT  . NEPHROLITHIASIS, HX OF  . SHINGLES, HX OF  . CORONARY ARTERY BYPASS GRAFT, HX OF  . BELLS PALSY  . Diarrhea  . Lipodystrophy  . Dry eye syndrome  . Epididymitis  . Erectile dysfunction    Patient's Medications  New Prescriptions   No medications on file  Previous Medications   AMLODIPINE (NORVASC) 2.5 MG TABLET    Take 1 tablet (2.5 mg total) by mouth daily.   ASPIRIN 325 MG TABLET    Take 325 mg by mouth daily.     ATENOLOL (TENORMIN) 50 MG TABLET    Take 1 tablet (50 mg total) by mouth daily.   ATORVASTATIN (LIPITOR) 20 MG TABLET    Take 1 tablet (20 mg total) by mouth daily.   COMBIVIR 150-300 MG PER TABLET    TAKE 1 TABLET BY MOUTH TWICE DAILY   FENOFIBRATE 160 MG TABLET    Take 1 tablet (160 mg total) by mouth daily.   GLUCOSE BLOOD TEST STRIP    Use as instructed to check blood sugar once a day Dx 250.00   LANCETS (FREESTYLE) LANCETS    Use as instructed to check blood sugar once a day Dx 250.00   LISINOPRIL-HYDROCHLOROTHIAZIDE (PRINZIDE,ZESTORETIC) 20-25 MG PER TABLET    Take 2 tablets by mouth daily.   METFORMIN (GLUCOPHAGE) 500 MG TABLET    Take 1 tablet (500 mg total) by mouth 2 (two) times daily with a meal.   NEVIRAPINE (VIRAMUNE) 200 MG TABLET    Take 1 tablet (200 mg total) by mouth 2 (two) times daily.   OMEPRAZOLE (PRILOSEC) 20 MG CAPSULE    Take 1 capsule (20 mg total) by mouth daily.   VALACYCLOVIR (VALTREX) 500  MG TABLET    as needed.   Modified Medications   No medications on file  Discontinued Medications   LAMIVUDINE-ZIDOVUDINE (COMBIVIR) 150-300 MG PER TABLET    Take 1 tablet by mouth 2 (two) times daily.    Subjective: [name] is in for his routine visit. He thinks he is only missed about 2 doses of his Combivir or Viramune in the last 2 months when he got busy and forgot. He is feeling well and does not have any new problems other than he is a little more concerned about his erectile dysfunction. He hasn't a boyfriend who is aware of his status and is also HIV positive. They have not been sexually active yet but the gym he states that he might be if he did not have such difficulty with erectile dysfunction.  Objective: Temp: 98.3 F (36.8 C) (05/30 1031) Temp src: Oral (05/30 1031) BP: 116/70 mmHg (05/30 1031) Pulse Rate: 67  (05/30 1031)  General: He is in good spirits. His granddaughter is with him Skin: No rash Lungs: Clear Cor: Regular S1 and S2 with no murmurs  Lab Results HIV 1 RNA  Quant (copies/mL)  Date Value  04/17/2011 <20   09/10/2010 39   07/02/2010 <20 copies/mL      HIV-1 RNA Viral Load (no units)  Date Value  09/15/2011 <40   05/27/2011 <40      CD4 T Cell Abs (cmm)  Date Value  04/17/2011 610   07/02/2010 490   12/03/2009 590      Assessment: His HIV infection remains under excellent control. I will continue his current regimen and encouraged him to try not to miss a single dose of his antiretroviral medications. His erectile dysfunction is probably multifactorial. He does not appear to be depressed now. I will check a testosterone level at the time of his next blood work before his visit in 6 months. He does use condoms if he does become sexually active.  Plan: 1. Continue Combivir and Viramune 2. Followup after lab work in 6 months   Cliffton Asters, MD [facility] for Infectious Disease [facility] [phone] pager   [phone]  cell 01/01/2012, 11:07 AM

## 2012-01-09 ENCOUNTER — Encounter: Payer: Self-pay | Admitting: Internal Medicine

## 2012-02-02 ENCOUNTER — Other Ambulatory Visit: Payer: Self-pay | Admitting: *Deleted

## 2012-02-02 DIAGNOSIS — B20 Human immunodeficiency virus [HIV] disease: Secondary | ICD-10-CM

## 2012-02-02 MED ORDER — NEVIRAPINE 200 MG PO TABS: 200.0000 mg | ORAL_TABLET | Freq: Two times a day (BID) | ORAL | Status: DC

## 2012-02-10 DIAGNOSIS — E119 Type 2 diabetes mellitus without complications: Secondary | ICD-10-CM | POA: Diagnosis not present

## 2012-02-10 LAB — CBC
HCT: 35 % — ABNORMAL LOW (ref 39.0–52.0)
Hemoglobin: 13 g/dL (ref 13.0–17.0)
MCH: 42.6 pg — ABNORMAL HIGH (ref 26.0–34.0)
MCHC: 37.1 g/dL — ABNORMAL HIGH (ref 30.0–36.0)
MCV: 114.8 fL — ABNORMAL HIGH (ref 78.0–100.0)
RBC: 3.05 MIL/uL — ABNORMAL LOW (ref 4.22–5.81)

## 2012-02-10 NOTE — Addendum Note (Signed)
Addended by: Candie Echevaria L on: 02/10/2012 09:14 AM   Modules accepted: Orders

## 2012-02-11 LAB — BASIC METABOLIC PANEL
BUN: 23 mg/dL (ref 6–23)
CO2: 27 mEq/L (ref 19–32)
Calcium: 9.2 mg/dL (ref 8.4–10.5)
Creat: 0.9 mg/dL (ref 0.50–1.35)
Glucose, Bld: 127 mg/dL — ABNORMAL HIGH (ref 70–99)

## 2012-02-17 ENCOUNTER — Encounter: Payer: Self-pay | Admitting: Internal Medicine

## 2012-02-17 ENCOUNTER — Ambulatory Visit (INDEPENDENT_AMBULATORY_CARE_PROVIDER_SITE_OTHER): Payer: Medicare Other | Admitting: Internal Medicine

## 2012-02-17 VITALS — BP 92/62 | HR 64 | Temp 98.4°F | Resp 14 | Wt 117.2 lb

## 2012-02-17 DIAGNOSIS — E119 Type 2 diabetes mellitus without complications: Secondary | ICD-10-CM | POA: Diagnosis not present

## 2012-02-17 DIAGNOSIS — E785 Hyperlipidemia, unspecified: Secondary | ICD-10-CM | POA: Diagnosis not present

## 2012-02-17 DIAGNOSIS — I10 Essential (primary) hypertension: Secondary | ICD-10-CM

## 2012-02-17 NOTE — Assessment & Plan Note (Signed)
Good control. Continue current regimen. 

## 2012-02-17 NOTE — Patient Instructions (Signed)
Please schedule fasting labs prior to next visit Chem7, a1c-250.0 and lipid/lft-272.4 

## 2012-02-17 NOTE — Assessment & Plan Note (Signed)
Normotensive and stable. Continue current regimen. Monitor bp as outpt and followup in clinic as scheduled.  

## 2012-02-17 NOTE — Progress Notes (Signed)
  Subjective:    Patient ID: [name], male    DOB: 12-Dec-1945, 66 y.o.   MRN: [id number]  HPI Pt presents to clinic for followup of multiple medical problems. Home bp log reviewed normotensive. fsbs log reviewed with good control-typically low 100's with some 90's. No hypoglycemia. Wants to try glucerna. UTD with HIV care through ID.  Past Medical History  Diagnosis Date  . Arthritis   . Diabetes mellitus   . History of depression   . GERD (gastroesophageal reflux disease)   . CAD (coronary artery disease)     2003- cabg  . Hypertension   . Hyperlipidemia   . History of kidney stones   . COPD (chronic obstructive pulmonary disease)   . HIV (human immunodeficiency virus infection)    Past Surgical History  Procedure Date  . Coronary artery bypass graft 05/2002  . Vasectomy     reports that he has never smoked. He has never used smokeless tobacco. He reports that he does not drink alcohol or use illicit drugs. family history includes Arthritis in an unspecified family member; Breast cancer in his maternal aunt; Diabetes in his mother; Heart disease in an unspecified family member; Hyperlipidemia in his father and mother; Hypertension in his father; Lung cancer in his maternal aunt; Mental retardation in his sister; and Stroke in his paternal grandmother. No Known Allergies    Review of Systems see hpi     Objective:   Physical Exam  Physical Exam  Nursing note and vitals reviewed. Constitutional: Appears well-developed and well-nourished. No distress.  HENT:  Head: Normocephalic and atraumatic.  Right Ear: External ear normal.  Left Ear: External ear normal.  Eyes: Conjunctivae are normal. No scleral icterus.  Neck: Neck supple. Carotid bruit is not present.  Cardiovascular: Normal rate, regular rhythm and normal heart sounds.  Exam reveals no gallop and no friction rub.   No murmur heard. Pulmonary/Chest: Effort normal and breath sounds normal. No respiratory  distress. He has no wheezes. no rales.  Lymphadenopathy:    He has no cervical adenopathy.  Neurological:Alert.  Skin: Skin is warm and dry. Not diaphoretic.  Psychiatric: Has a normal mood and affect.        Assessment & Plan:

## 2012-02-17 NOTE — Assessment & Plan Note (Signed)
Stable. Obtain lipid/lft prior to next visit. 

## 2012-02-18 ENCOUNTER — Telehealth: Payer: Self-pay | Admitting: *Deleted

## 2012-02-18 DIAGNOSIS — E785 Hyperlipidemia, unspecified: Secondary | ICD-10-CM

## 2012-02-18 DIAGNOSIS — E119 Type 2 diabetes mellitus without complications: Secondary | ICD-10-CM

## 2012-02-18 MED ORDER — GLUCERNA PO LIQD: 1.0000 | Freq: Two times a day (BID) | ORAL | Status: DC

## 2012-02-18 NOTE — Telephone Encounter (Signed)
Patient called and advised that his is now a diabetic as he saw Dr Rodena Medin recently and was diagnosed. He also asked if he could have an Rx for Glucerna in place of his Ensure. Advised that he got a Rx from Dr Rodena Medin but [facility] can only take one from his PCP. And he wanted to know if we could change it for him.

## 2012-02-18 NOTE — Telephone Encounter (Signed)
If can't be added then ok to wait til next visit

## 2012-02-18 NOTE — Telephone Encounter (Signed)
Could you see if [facility] can add an a1c from last labs? Dx250.00 Thanks W  Cannot get A1C from prior draw; attempted to reach patient [home & work number are both business], mobile number is not a working number/SLS

## 2012-02-20 ENCOUNTER — Ambulatory Visit: Payer: Medicare Other

## 2012-03-04 NOTE — Telephone Encounter (Signed)
Lab orders placed/SLS 

## 2012-03-16 ENCOUNTER — Ambulatory Visit: Payer: Medicare Other

## 2012-03-29 ENCOUNTER — Other Ambulatory Visit: Payer: Self-pay | Admitting: Internal Medicine

## 2012-05-11 ENCOUNTER — Encounter: Payer: Self-pay | Admitting: Internal Medicine

## 2012-05-11 DIAGNOSIS — E119 Type 2 diabetes mellitus without complications: Secondary | ICD-10-CM | POA: Diagnosis not present

## 2012-05-11 DIAGNOSIS — E785 Hyperlipidemia, unspecified: Secondary | ICD-10-CM | POA: Diagnosis not present

## 2012-05-11 LAB — LIPID PANEL
LDL Cholesterol: 68 mg/dL (ref 0–99)
VLDL: 31 mg/dL (ref 0–40)

## 2012-05-11 LAB — HEPATIC FUNCTION PANEL
Albumin: 4.3 g/dL (ref 3.5–5.2)
Alkaline Phosphatase: 41 U/L (ref 39–117)
Indirect Bilirubin: 0.4 mg/dL (ref 0.0–0.9)
Total Bilirubin: 0.5 mg/dL (ref 0.3–1.2)

## 2012-05-11 LAB — BASIC METABOLIC PANEL
CO2: 27 mEq/L (ref 19–32)
Calcium: 9 mg/dL (ref 8.4–10.5)
Creat: 0.79 mg/dL (ref 0.50–1.35)
Sodium: 140 mEq/L (ref 135–145)

## 2012-05-11 LAB — HEMOGLOBIN A1C: Hgb A1c MFr Bld: 5.5 % (ref <5.7)

## 2012-05-11 NOTE — Telephone Encounter (Signed)
Pt presented to the lab, orders released. 

## 2012-05-11 NOTE — Addendum Note (Signed)
Addended by: Mervin Kung A on: 05/11/2012 03:05 PM   Modules accepted: Orders

## 2012-05-18 ENCOUNTER — Encounter: Payer: Self-pay | Admitting: Gastroenterology

## 2012-05-18 ENCOUNTER — Encounter: Payer: Self-pay | Admitting: Internal Medicine

## 2012-05-18 ENCOUNTER — Ambulatory Visit (INDEPENDENT_AMBULATORY_CARE_PROVIDER_SITE_OTHER): Payer: Medicare Other | Admitting: Internal Medicine

## 2012-05-18 ENCOUNTER — Telehealth: Payer: Self-pay | Admitting: Internal Medicine

## 2012-05-18 VITALS — BP 100/62 | HR 67 | Temp 97.6°F | Resp 16 | Wt 121.0 lb

## 2012-05-18 DIAGNOSIS — I10 Essential (primary) hypertension: Secondary | ICD-10-CM

## 2012-05-18 DIAGNOSIS — E119 Type 2 diabetes mellitus without complications: Secondary | ICD-10-CM | POA: Diagnosis not present

## 2012-05-18 DIAGNOSIS — E785 Hyperlipidemia, unspecified: Secondary | ICD-10-CM

## 2012-05-18 DIAGNOSIS — Z79899 Other long term (current) drug therapy: Secondary | ICD-10-CM

## 2012-05-18 DIAGNOSIS — K921 Melena: Secondary | ICD-10-CM

## 2012-05-18 DIAGNOSIS — Z23 Encounter for immunization: Secondary | ICD-10-CM

## 2012-05-18 NOTE — Telephone Encounter (Signed)
Lab order week of 06-07-2012 Please schedule non fasting lab test for LFT in approximately 3-4 weeks

## 2012-05-18 NOTE — Patient Instructions (Signed)
Please schedule non fasting lab test for LFT in approximately 3-4 weeks Also please schedule fasting labs prior to next visit-cbc, chem7, a1c, urine microalbumin-250.00

## 2012-05-18 NOTE — Telephone Encounter (Signed)
Week of 08-19-2012 visit-cbc, chem7, a1c, urine microalbumin-250.00

## 2012-05-22 DIAGNOSIS — K921 Melena: Secondary | ICD-10-CM | POA: Insufficient documentation

## 2012-05-22 NOTE — Assessment & Plan Note (Signed)
With associated change in bowel habits. GI referral for colonoscopy.

## 2012-05-22 NOTE — Assessment & Plan Note (Signed)
Excellent control. Continue current regimen 

## 2012-05-22 NOTE — Progress Notes (Signed)
  Subjective:    Patient ID: [name], male    DOB: July 27, 1946, 66 y.o.   MRN: [id number]  HPI Pt presents to clinic for followup of multiple medical problems. Reviewed A1c under excellent control. Demonstrates minimal elevations of liver function tests. No new medications. Blood pressures at home have been normotensive. Reviewed home range between eighty-eight and one thirty-six. No hypoglycemia. Reports change in bowel habits alternating between loose stools and constipation. Two weeks ago had episode of blood in stool. No colonoscopy report in chart. Previous physician attempted GI referral for colonoscopy several years ago.  Past Medical History  Diagnosis Date  . Arthritis   . Diabetes mellitus   . History of depression   . GERD (gastroesophageal reflux disease)   . CAD (coronary artery disease)     2003- cabg  . Hypertension   . Hyperlipidemia   . History of kidney stones   . COPD (chronic obstructive pulmonary disease)   . HIV (human immunodeficiency virus infection)    Past Surgical History  Procedure Date  . Coronary artery bypass graft 05/2002  . Vasectomy     reports that he has never smoked. He has never used smokeless tobacco. He reports that he does not drink alcohol or use illicit drugs. family history includes Arthritis in an unspecified family member; Breast cancer in his maternal aunt; Diabetes in his mother; Heart disease in an unspecified family member; Hyperlipidemia in his father and mother; Hypertension in his father; Lung cancer in his maternal aunt; Mental retardation in his sister; and Stroke in his paternal grandmother. No Known Allergies    Review of Systems see hpi     Objective:   Physical Exam  Physical Exam  Nursing note and vitals reviewed. Constitutional: Appears well-developed and well-nourished. No distress.  HENT:  Head: Normocephalic and atraumatic.  Right Ear: External ear normal.  Left Ear: External ear normal.  Eyes: Conjunctivae  are normal. No scleral icterus.  Neck: Neck supple. Carotid bruit is not present.  Cardiovascular: Normal rate, regular rhythm and normal heart sounds.  Exam reveals no gallop and no friction rub.   No murmur heard. Pulmonary/Chest: Effort normal and breath sounds normal. No respiratory distress. He has no wheezes. no rales.  Lymphadenopathy:    He has no cervical adenopathy.  Neurological:Alert.  Skin: Skin is warm and dry. Not diaphoretic.  Psychiatric: Has a normal mood and affect.        Assessment & Plan:

## 2012-05-22 NOTE — Assessment & Plan Note (Signed)
Good control. Attempt trial off of Norvasc. Monitor blood pressure as an outpatient

## 2012-05-25 ENCOUNTER — Encounter: Payer: Self-pay | Admitting: Gastroenterology

## 2012-05-25 ENCOUNTER — Ambulatory Visit (INDEPENDENT_AMBULATORY_CARE_PROVIDER_SITE_OTHER): Payer: Medicare Other | Admitting: Gastroenterology

## 2012-05-25 VITALS — BP 120/44 | HR 70 | Ht 63.75 in | Wt 124.0 lb

## 2012-05-25 DIAGNOSIS — R198 Other specified symptoms and signs involving the digestive system and abdomen: Secondary | ICD-10-CM | POA: Diagnosis not present

## 2012-05-25 DIAGNOSIS — K921 Melena: Secondary | ICD-10-CM | POA: Diagnosis not present

## 2012-05-25 DIAGNOSIS — R194 Change in bowel habit: Secondary | ICD-10-CM

## 2012-05-25 MED ORDER — PEG-KCL-NACL-NASULF-NA ASC-C 100 G PO SOLR: 1.0000 | Freq: Once | ORAL | Status: DC

## 2012-05-25 NOTE — Progress Notes (Signed)
HPI: This is a   very pleasant 66 year old man whom I last saw over 5 years ago at the time of a colonoscopy. See those results summarized below.  colonocopy 03/2007 for mild constipation, was normal.  Recommended repeat exam in 10 years.  He noticed rectal bleeding about 1.5 months ago, occurred twice that week.  NO associated anal discomforts or constipation.  This wsa a small amount, dripping into toilett.  HE had to fashion a pad with TP into his underwear.   He noticed a very dark stool.  Has seen some other minor red blood on TP.    No colon cancer, polyps in family.   He has alternating bowels (constipation, diarrhea), this is new for his as well. HAs had some incontinence as well.    He takes no blood thinners. Does take one asa a day 325mg .    Very rare tylenol, no NSAIDs however.  Review of systems: Pertinent positive and negative review of systems were noted in the above HPI section. Complete review of systems was performed and was otherwise normal.    Past Medical History  Diagnosis Date  . Arthritis   . Diabetes mellitus   . History of depression   . GERD (gastroesophageal reflux disease)   . CAD (coronary artery disease)     2003- cabg  . Hypertension   . Hyperlipidemia   . History of kidney stones   . COPD (chronic obstructive pulmonary disease)   . HIV (human immunodeficiency virus infection)     Past Surgical History  Procedure Date  . Coronary artery bypass graft 05/2002  . Vasectomy     Current Outpatient Prescriptions  Medication Sig Dispense Refill  . amLODipine (NORVASC) 2.5 MG tablet Take 1 tablet (2.5 mg total) by mouth daily.  30 tablet  6  . aspirin 325 MG tablet Take 325 mg by mouth daily.        Marland Kitchen atenolol (TENORMIN) 50 MG tablet Take 1 tablet (50 mg total) by mouth daily.  30 tablet  6  . atorvastatin (LIPITOR) 20 MG tablet Take 1 tablet (20 mg total) by mouth daily.  30 tablet  6  . COMBIVIR 150-300 MG per tablet TAKE 1 TABLET BY MOUTH TWICE  DAILY  60 tablet  10  . fenofibrate 160 MG tablet Take 1 tablet (160 mg total) by mouth daily.  30 tablet  11  . Glucerna (GLUCERNA) LIQD Take 1 Can by mouth 2 (two) times daily between meals.  48 Can  5  . glucose blood test strip Use as instructed to check blood sugar once a day Dx 250.00  100 each  6  . Lancets (FREESTYLE) lancets Use as instructed to check blood sugar once a day Dx 250.00  100 each  6  . lisinopril-hydrochlorothiazide (PRINZIDE,ZESTORETIC) 20-25 MG per tablet Take 2 tablets by mouth daily.  60 tablet  6  . metFORMIN (GLUCOPHAGE) 500 MG tablet Take 1 tablet (500 mg total) by mouth 2 (two) times daily with a meal.  60 tablet  6  . nevirapine (VIRAMUNE) 200 MG tablet Take 1 tablet (200 mg total) by mouth 2 (two) times daily.  60 tablet  12  . omeprazole (PRILOSEC) 20 MG capsule TAKE 1 CAPSULE BY MOUTH EVERY DAY . CAPSULE MAY BE OPENED AND MIXED WITH 1 TABLESPOONFUL OF APPLESAUCE  30 capsule  PRN  . valACYclovir (VALTREX) 500 MG tablet as needed.         Allergies as of 05/25/2012  . (  No Known Allergies)    Family History  Problem Relation Age of Onset  . Arthritis      mother/father/paternal grandparents  . Breast cancer Maternal Aunt     paternal aunt  . Lung cancer Maternal Aunt   . Hyperlipidemia Mother   . Hyperlipidemia Father   . Heart disease      parents/maternal grandparents/ 2 brothers  . Stroke Paternal Grandmother   . Hypertension Father     paternal grandmother/3 brothers/1 sister  . Mental retardation Sister   . Diabetes Mother     paternal grandparents/1 brother    History   Social History  . Marital Status: Divorced    Spouse Name: N/A    Number of Children: 4  . Years of Education: N/A   Occupational History  . Retired     Disability   Social History Main Topics  . Smoking status: Never Smoker   . Smokeless tobacco: Never Used  . Alcohol Use: No  . Drug Use: No  . Sexually Active: Not Currently     declined condoms   Other  Topics Concern  . Not on file   Social History Narrative  . No narrative on file       Physical Exam: BP 120/44  Pulse 70  Ht 5' 3.75" (1.619 m)  Wt 124 lb (56.246 kg)  BMI 21.45 kg/m2 Constitutional: generally well-appearing Psychiatric: alert and oriented x3 Eyes: extraocular movements intact Mouth: oral pharynx moist, no lesions Neck: supple no lymphadenopathy Cardiovascular: heart regular rate and rhythm Lungs: clear to auscultation bilaterally Abdomen: soft, nontender, nondistended, no obvious ascites, no peritoneal signs, normal bowel sounds Extremities: no lower extremity edema bilaterally Skin: no lesions on visible extremities    Assessment and plan: 66 y.o. male with  recent rectal bleeding, recent change in bowel habits.  He had a colonoscopy a little over 5 years ago and given his new rectal bleeding and changes in bowel habits we should repeat that for him now. I am putting him on fiber supplements to help out his alternating bowel pattern and we'll make other adjustments at the time of his colonoscopy.

## 2012-05-25 NOTE — Patient Instructions (Addendum)
Please start taking citrucel (orange flavored) powder fiber supplement.  This may cause some bloating at first but that usually goes away. Begin with a small spoonful and work your way up to a large, heaping spoonful daily over a week. You will be set up for a colonoscopy for rectal bleeding, change in bowel function.  (LEC moderate sedation).

## 2012-06-07 ENCOUNTER — Other Ambulatory Visit: Payer: Medicare Other | Admitting: Gastroenterology

## 2012-06-10 DIAGNOSIS — E785 Hyperlipidemia, unspecified: Secondary | ICD-10-CM | POA: Diagnosis not present

## 2012-06-10 LAB — HEPATIC FUNCTION PANEL
ALT: 43 U/L (ref 0–53)
Alkaline Phosphatase: 43 U/L (ref 39–117)
Bilirubin, Direct: 0.1 mg/dL (ref 0.0–0.3)
Indirect Bilirubin: 0.4 mg/dL (ref 0.0–0.9)
Total Protein: 6.3 g/dL (ref 6.0–8.3)

## 2012-06-10 NOTE — Telephone Encounter (Signed)
Lab orders placed [release of LFT]/SLS

## 2012-06-22 ENCOUNTER — Other Ambulatory Visit (INDEPENDENT_AMBULATORY_CARE_PROVIDER_SITE_OTHER): Payer: Medicare Other

## 2012-06-22 DIAGNOSIS — B20 Human immunodeficiency virus [HIV] disease: Secondary | ICD-10-CM | POA: Diagnosis not present

## 2012-06-22 DIAGNOSIS — N529 Male erectile dysfunction, unspecified: Secondary | ICD-10-CM

## 2012-06-22 LAB — TESTOSTERONE: Testosterone: 398.4 ng/dL (ref 300–890)

## 2012-06-23 LAB — T-HELPER CELL (CD4) - (RCID CLINIC ONLY)
CD4 % Helper T Cell: 45 % (ref 33–55)
CD4 T Cell Abs: 430 uL (ref 400–2700)

## 2012-06-28 ENCOUNTER — Telehealth: Payer: Self-pay | Admitting: Internal Medicine

## 2012-06-28 DIAGNOSIS — I10 Essential (primary) hypertension: Secondary | ICD-10-CM

## 2012-06-28 MED ORDER — ATENOLOL 50 MG PO TABS: 50.0000 mg | ORAL_TABLET | Freq: Every day | ORAL | Status: DC

## 2012-06-28 MED ORDER — METFORMIN HCL 500 MG PO TABS: 500.0000 mg | ORAL_TABLET | Freq: Two times a day (BID) | ORAL | Status: DC

## 2012-06-28 MED ORDER — ATORVASTATIN CALCIUM 20 MG PO TABS: 20.0000 mg | ORAL_TABLET | Freq: Every day | ORAL | Status: DC

## 2012-06-28 MED ORDER — AMLODIPINE BESYLATE 2.5 MG PO TABS: 2.5000 mg | ORAL_TABLET | Freq: Every day | ORAL | Status: DC

## 2012-06-28 MED ORDER — LISINOPRIL-HYDROCHLOROTHIAZIDE 20-25 MG PO TABS: 2.0000 | ORAL_TABLET | Freq: Every day | ORAL | Status: DC

## 2012-06-28 NOTE — Telephone Encounter (Signed)
Refill- atorvastatin 20mg  tablets. Take one tablet by mouth every day. Qty 30 last fill 8.26.13  Refill-amlodipine besylate 2.5mg  tablets. Take one tablet by mouth every day. Qty 30 last fill 8.26.13  Refill- metformin 500mg  tablets. Take one tablet by mouth twice daily with a meal. Qty 60 last fill 8.26.13  Refill- lisinopril-hctz 20/25mg  tablets. Take two tablets by mouth every day. Qty 60 last fill 8.26.13  Refill- atenolol 50mg  tablets. Take one tablet by mouth every day. Qty 30 last fill 8.26.13

## 2012-06-28 NOTE — Telephone Encounter (Signed)
Rx[s] to pharmacy/SLS 

## 2012-07-06 ENCOUNTER — Ambulatory Visit (INDEPENDENT_AMBULATORY_CARE_PROVIDER_SITE_OTHER): Payer: Medicare Other | Admitting: Internal Medicine

## 2012-07-06 ENCOUNTER — Encounter: Payer: Self-pay | Admitting: Internal Medicine

## 2012-07-06 VITALS — BP 143/84 | HR 84 | Temp 98.6°F | Ht 63.0 in | Wt 123.8 lb

## 2012-07-06 DIAGNOSIS — R682 Dry mouth, unspecified: Secondary | ICD-10-CM

## 2012-07-06 DIAGNOSIS — K117 Disturbances of salivary secretion: Secondary | ICD-10-CM

## 2012-07-06 DIAGNOSIS — Z79899 Other long term (current) drug therapy: Secondary | ICD-10-CM

## 2012-07-06 DIAGNOSIS — B20 Human immunodeficiency virus [HIV] disease: Secondary | ICD-10-CM

## 2012-07-06 DIAGNOSIS — R61 Generalized hyperhidrosis: Secondary | ICD-10-CM

## 2012-07-06 HISTORY — DX: Generalized hyperhidrosis: R61

## 2012-07-06 NOTE — Progress Notes (Signed)
Patient ID: [name], male   DOB: [birth year], 66 y.o.   MRN: [id number]     [facility]  Patient Active Problem List  Diagnosis  . HIV INFECTION  . GENITAL HERPES  . DIABETES MELLITUS, TYPE II  . DYSLIPIDEMIA  . DEPRESSION  . HEARING LOSS, SENSORINEURAL  . HYPERTENSION  . CORONARY ARTERY DISEASE  . PERIPHERAL VASCULAR DISEASE  . COPD  . GERD  . HEMATOCHEZIA  . HIP PAIN, BILATERAL  . KNEE PAIN, BILATERAL  . MEMORY LOSS  . WEIGHT LOSS, ABNORMAL  . INGUINAL LYMPHADENOPATHY, RIGHT  . NEPHROLITHIASIS, HX OF  . SHINGLES, HX OF  . CORONARY ARTERY BYPASS GRAFT, HX OF  . BELLS PALSY  . Diarrhea  . Lipodystrophy  . Dry eye syndrome  . Epididymitis  . Erectile dysfunction  . Dry mouth  . Night sweats    Patient's Medications  New Prescriptions   No medications on file  Previous Medications   AMLODIPINE (NORVASC) 2.5 MG TABLET    Take 1 tablet (2.5 mg total) by mouth daily.   ASPIRIN 325 MG TABLET    Take 325 mg by mouth daily.     ATENOLOL (TENORMIN) 50 MG TABLET    Take 1 tablet (50 mg total) by mouth daily.   ATORVASTATIN (LIPITOR) 20 MG TABLET    Take 1 tablet (20 mg total) by mouth daily.   COMBIVIR 150-300 MG PER TABLET    TAKE 1 TABLET BY MOUTH TWICE DAILY   FENOFIBRATE 160 MG TABLET    Take 1 tablet (160 mg total) by mouth daily.   GLUCERNA (GLUCERNA) LIQD    Take 1 Can by mouth 2 (two) times daily between meals.   GLUCOSE BLOOD TEST STRIP    Use as instructed to check blood sugar once a day Dx 250.00   LANCETS (FREESTYLE) LANCETS    Use as instructed to check blood sugar once a day Dx 250.00   LISINOPRIL-HYDROCHLOROTHIAZIDE (PRINZIDE,ZESTORETIC) 20-25 MG PER TABLET    Take 2 tablets by mouth daily.   METFORMIN (GLUCOPHAGE) 500 MG TABLET    Take 1 tablet (500 mg total) by mouth 2 (two) times daily with a meal.   NEVIRAPINE (VIRAMUNE) 200 MG TABLET    Take 1 tablet (200 mg total) by mouth 2 (two) times daily.   OMEPRAZOLE (PRILOSEC) 20 MG  CAPSULE    TAKE 1 CAPSULE BY MOUTH EVERY DAY . CAPSULE MAY BE OPENED AND MIXED WITH 1 TABLESPOONFUL OF APPLESAUCE   PEG 3350 POWDER (MOVIPREP) 100 G SOLR    Take 1 kit (100 g total) by mouth once.   VALACYCLOVIR (VALTREX) 500 MG TABLET    as needed.   Modified Medications   No medications on file  Discontinued Medications   No medications on file    Subjective: [name] is in for his routine visit. As usual he never misses a single dose of his Combivir or Viramune. He states that he's feeling well other than the fact that he has noted some dry mouth at night over the last few months. He does not have any dry mouth during the day. He has not started any new medications. When he notes a dry mouth at night he will take a sip of water and it improves. He is not aware of snoring or whether he previously was mouth at night.  He states that his also had night sweats over the past 6 months. They were happening fairly consistently each  evening. He does not feel like he has been having any fever. Recently he turned the thermostat down his house to 66 and he has not had any sweats since that time. He has not noted any change in hot or cold tolerance. His appetite is good.  Objective: Temp: 98.6 F (37 C) (12/03 1057) Temp src: Oral (12/03 1057) BP: 143/84 mmHg (12/03 1057) Pulse Rate: 84  (12/03 1057)  General: He is in good spirits and appears healthy. He has had no change in his weight Skin: No rash Mouth: No oropharyngeal lesions with moist mucous membranes Lungs: Clear Cor: Regular S1 and S2 no murmurs  Lab Results HIV 1 RNA Quant (copies/mL)  Date Value  06/22/2012 <20   04/17/2011 <20   09/10/2010 39      HIV-1 RNA Viral Load (no units)  Date Value  12/17/2011 <40   09/15/2011 <40   05/27/2011 <40      CD4 T Cell Abs (cmm)  Date Value  06/22/2012 430   04/17/2011 610   07/02/2010 490      Assessment: His HIV infection remains under excellent control. I will continue his current  regimen.  I do not see any obvious cause for his dry mouth. The fact that he does not have any dry mouth during the day and his current exam do not suggest any problems with salivary gland dysfunction.  His night sweats are probably do to a relative shift in his peak tolerance. The fact that they have resolved but simply lowering the thermostat at home is a good indication that this is not any serious problem. I see no evidence that he has any active infection.  Plan: 1. Continue current antiretroviral regimen 2. Followup after lab work in 6 months   Cliffton Asters, MD [facility] [phone] pager   [phone] cell 07/06/2012, 11:32 AM

## 2012-07-14 ENCOUNTER — Telehealth: Payer: Self-pay | Admitting: Gastroenterology

## 2012-07-14 ENCOUNTER — Telehealth: Payer: Self-pay | Admitting: Internal Medicine

## 2012-07-14 NOTE — Telephone Encounter (Signed)
Pt instructions were reviewed with the pt and all his questions were answered.  He will call with any further concerns

## 2012-07-14 NOTE — Telephone Encounter (Signed)
Patient Information:  Caller Name: Joshual  Phone: [phone]  Patient: [name], [name]  Gender: Male  DOB: Mar 29, 1946  Age: 66 Years  PCP: Marguarite Arbour (Adults only)  Office Follow Up:  Does the office need to follow up with this patient?: No  Instructions For The Office: N/A  RN Note:  RN advised OTC Delsym and to also follow up with the office if not  Symptoms  Reason For Call & Symptoms: pt reports he began with congestion, runny nose and cough.  Phelgm was clear but now it is yellow  Reviewed Health History In EMR: Yes  Reviewed Medications In EMR: Yes  Reviewed Allergies In EMR: Yes  Reviewed Surgeries / Procedures: Yes  Date of Onset of Symptoms: 07/08/2012  Treatments Tried: Zyrtec  Treatments Tried Worked: No  Guideline(s) Used:  Cough  Colds  Disposition Per Guideline:   Home Care  Reason For Disposition Reached:   Colds with no complications  Advice Given:  For a Runny Nose With Profuse Discharge:   Nasal mucus and discharge helps to wash viruses and bacteria out of the nose and sinuses.  For a Stuffy Nose - Use Nasal Washes:  Introduction: Saline (salt water) nasal irrigation (nasal wash) is an effective and simple home remedy for treating stuffy nose and sinus congestion. The nose can be irrigated by pouring, spraying, or squirting salt water into the nose and then letting it run back out.  How it Helps: The salt water rinses out excess mucus, washes out any irritants (dust, allergens) that might be present, and moistens the nasal cavity.  Reassurance  Colds are very common and may make you feel uncomfortable.

## 2012-07-21 ENCOUNTER — Ambulatory Visit (AMBULATORY_SURGERY_CENTER): Payer: Medicare Other | Admitting: Gastroenterology

## 2012-07-21 ENCOUNTER — Encounter: Payer: Self-pay | Admitting: Gastroenterology

## 2012-07-21 VITALS — BP 85/56 | HR 62 | Temp 95.6°F | Resp 13 | Ht 63.0 in | Wt 124.0 lb

## 2012-07-21 DIAGNOSIS — K921 Melena: Secondary | ICD-10-CM

## 2012-07-21 DIAGNOSIS — D126 Benign neoplasm of colon, unspecified: Secondary | ICD-10-CM

## 2012-07-21 DIAGNOSIS — K644 Residual hemorrhoidal skin tags: Secondary | ICD-10-CM

## 2012-07-21 DIAGNOSIS — R198 Other specified symptoms and signs involving the digestive system and abdomen: Secondary | ICD-10-CM

## 2012-07-21 LAB — GLUCOSE, CAPILLARY: Glucose-Capillary: 114 mg/dL — ABNORMAL HIGH (ref 70–99)

## 2012-07-21 MED ORDER — SODIUM CHLORIDE 0.9 % IV SOLN: 500.0000 mL | INTRAVENOUS | Status: DC

## 2012-07-21 NOTE — Progress Notes (Signed)
Pt's BP 81/54.  He took his BP meds today.  Dr. Christella Hartigan aware.

## 2012-07-21 NOTE — Patient Instructions (Addendum)
YOU HAD AN ENDOSCOPIC PROCEDURE TODAY AT THE [facility]: Refer to the procedure report that was given to you for any specific questions about what was found during the examination.  If the procedure report does not answer your questions, please call your gastroenterologist to clarify.  If you requested that your care partner not be given the details of your procedure findings, then the procedure report has been included in a sealed envelope for you to review at your convenience later.  YOU SHOULD EXPECT: Some feelings of bloating in the abdomen. Passage of more gas than usual.  Walking can help get rid of the air that was put into your GI tract during the procedure and reduce the bloating. If you had a lower endoscopy (such as a colonoscopy or flexible sigmoidoscopy) you may notice spotting of blood in your stool or on the toilet paper. If you underwent a bowel prep for your procedure, then you may not have a normal bowel movement for a few days.  DIET: Your first meal following the procedure should be a light meal and then it is ok to progress to your normal diet.  A half-sandwich or bowl of soup is an example of a good first meal.  Heavy or fried foods are harder to digest and may make you feel nauseous or bloated.  Likewise meals heavy in dairy and vegetables can cause extra gas to form and this can also increase the bloating.  Drink plenty of fluids but you should avoid alcoholic beverages for 24 hours.  ACTIVITY: Your care partner should take you home directly after the procedure.  You should plan to take it easy, moving slowly for the rest of the day.  You can resume normal activity the day after the procedure however you should NOT DRIVE or use heavy machinery for 24 hours (because of the sedation medicines used during the test).    SYMPTOMS TO REPORT IMMEDIATELY: A gastroenterologist can be reached at any hour.  During normal business hours, 8:30 AM to 5:00 PM Monday through Friday,  call [phone].  After hours and on weekends, please call the GI answering service at [phone] who will take a message and have the physician on call contact you.   Following lower endoscopy (colonoscopy or flexible sigmoidoscopy):  Excessive amounts of blood in the stool  Significant tenderness or worsening of abdominal pains  Swelling of the abdomen that is new, acute  Fever of 100F or higher  Following upper endoscopy (EGD)  Vomiting of blood or coffee ground material  New chest pain or pain under the shoulder blades  Painful or persistently difficult swallowing  New shortness of breath  Fever of 100F or higher  Black, tarry-looking stools  FOLLOW UP: If any biopsies were taken you will be contacted by phone or by letter within the next 1-3 weeks.  Call your gastroenterologist if you have not heard about the biopsies in 3 weeks.  Our staff will call the home number listed on your records the next business day following your procedure to check on you and address any questions or concerns that you may have at that time regarding the information given to you following your procedure. This is a courtesy call and so if there is no answer at the home number and we have not heard from you through the emergency physician on call, we will assume that you have returned to your regular daily activities without incident.  SIGNATURES/CONFIDENTIALITY: You and/or your care   partner have signed paperwork which will be entered into your electronic medical record.  These signatures attest to the fact that that the information above on your After Visit Summary has been reviewed and is understood.  Full responsibility of the confidentiality of this discharge information lies with you and/or your care-partner.  

## 2012-07-21 NOTE — Progress Notes (Signed)
Patient did not experience any of the following events: a burn prior to discharge; a fall within the facility; wrong site/side/patient/procedure/implant event; or a hospital transfer or hospital admission upon discharge from the facility. (G8907) Patient did not have preoperative order for IV antibiotic SSI prophylaxis. (G8918)  

## 2012-07-21 NOTE — Op Note (Signed)
[facility] [address] [place], [address]   COLONOSCOPY PROCEDURE REPORT  PATIENT: [name], [name]  MR#: [id number] BIRTHDATE: April 28, 1946 , 66  yrs. old GENDER: Male ENDOSCOPIST: Rachael Fee, MD PROCEDURE DATE:  07/21/2012 PROCEDURE:   Colonoscopy with snare polypectomy ASA CLASS:   Class III INDICATIONS:minor rectal bleeding, last colonoscopy 5-6 years ago.  MEDICATIONS: Fentanyl 50 mcg IV, Versed 6 mg IV, and These medications were titrated to patient response per physician's verbal order  DESCRIPTION OF PROCEDURE:   After the risks benefits and alternatives of the procedure were thoroughly explained, informed consent was obtained.  A digital rectal exam revealed no abnormalities of the rectum.   The LB [id number]  endoscope was introduced through the anus and advanced to the cecum, which was identified by both the appendix and ileocecal valve. No adverse events experienced.   The quality of the prep was good, using MoviPrep  The instrument was then slowly withdrawn as the colon was fully examined.  COLON FINDINGS: One small polyp was found, removed and sent to pathology.  This was sessile 2-61mm acroos, located in ascending segment, removed with cold snare, pathology jar 1.  There were medium sized, non-thrombosed external hemorrhoids.  The examination was otherwise normal.  Retroflexed views revealed no abnormalities. The time to cecum=1 minutes 41 seconds.  Withdrawal time=7 minutes 13 seconds.  The scope was withdrawn and the procedure completed. COMPLICATIONS: There were no complications.  ENDOSCOPIC IMPRESSION: One small polyp was found, removed and sent to pathology. There were medium sized, non-thrombosed external hemorrhoids. This is the likely source of your minor bleeding. The examination was otherwise normal.  RECOMMENDATIONS: If the polyp(s) removed today are proven to be adenomatous (pre-cancerous) polyps, you will need a  repeat colonoscopy in 5 years.  Otherwise you should continue to follow colorectal cancer screening guidelines for "routine risk" patients with colonoscopy in 10 years.  You will receive a letter within 1-2 weeks with the results of your biopsy as well as final recommendations.  Please call my office if you have not received a letter after 3 weeks. If rectal bleeding persistent, becomes more bothersome then you should try OTC Preparation H.   You should continue once daily fiber supplement since it seems to be helping your bowel habits.   eSigned:  Rachael Fee, MD 07/21/2012 9:16 AM   cc: Charlynn Court, MD

## 2012-07-22 ENCOUNTER — Telehealth: Payer: Self-pay | Admitting: *Deleted

## 2012-07-22 NOTE — Telephone Encounter (Signed)
Name identifier, left message, follow-up 

## 2012-07-29 ENCOUNTER — Encounter: Payer: Self-pay | Admitting: Gastroenterology

## 2012-08-10 DIAGNOSIS — E119 Type 2 diabetes mellitus without complications: Secondary | ICD-10-CM | POA: Diagnosis not present

## 2012-08-10 DIAGNOSIS — Z79899 Other long term (current) drug therapy: Secondary | ICD-10-CM | POA: Diagnosis not present

## 2012-08-10 LAB — BASIC METABOLIC PANEL
BUN: 18 mg/dL (ref 6–23)
CO2: 25 mEq/L (ref 19–32)
Chloride: 105 mEq/L (ref 96–112)
Creat: 0.74 mg/dL (ref 0.50–1.35)
Glucose, Bld: 130 mg/dL — ABNORMAL HIGH (ref 70–99)
Potassium: 4.5 mEq/L (ref 3.5–5.3)

## 2012-08-10 LAB — CBC WITH DIFFERENTIAL/PLATELET
Eosinophils Relative: 3 % (ref 0–5)
HCT: 37.3 % — ABNORMAL LOW (ref 39.0–52.0)
Hemoglobin: 13.3 g/dL (ref 13.0–17.0)
Lymphocytes Relative: 26 % (ref 12–46)
MCV: 116.2 fL — ABNORMAL HIGH (ref 78.0–100.0)
Monocytes Absolute: 0.4 10*3/uL (ref 0.1–1.0)
Monocytes Relative: 10 % (ref 3–12)
Neutro Abs: 2.5 10*3/uL (ref 1.7–7.7)
RDW: 13.9 % (ref 11.5–15.5)
WBC: 4.1 10*3/uL (ref 4.0–10.5)

## 2012-08-10 LAB — HEMOGLOBIN A1C: Mean Plasma Glucose: 123 mg/dL — ABNORMAL HIGH (ref <117)

## 2012-08-10 NOTE — Addendum Note (Signed)
Addended by: Regis Bill on: 08/10/2012 11:36 AM   Modules accepted: Orders

## 2012-08-10 NOTE — Telephone Encounter (Signed)
Lab orders released/SLS 

## 2012-08-17 ENCOUNTER — Encounter: Payer: Self-pay | Admitting: Internal Medicine

## 2012-08-17 ENCOUNTER — Telehealth: Payer: Self-pay | Admitting: Internal Medicine

## 2012-08-17 ENCOUNTER — Ambulatory Visit (INDEPENDENT_AMBULATORY_CARE_PROVIDER_SITE_OTHER): Payer: Medicare Other | Admitting: Internal Medicine

## 2012-08-17 VITALS — BP 124/72 | HR 61 | Temp 98.0°F | Resp 14 | Wt 124.5 lb

## 2012-08-17 DIAGNOSIS — E119 Type 2 diabetes mellitus without complications: Secondary | ICD-10-CM

## 2012-08-17 DIAGNOSIS — I10 Essential (primary) hypertension: Secondary | ICD-10-CM | POA: Diagnosis not present

## 2012-08-17 MED ORDER — LISINOPRIL-HYDROCHLOROTHIAZIDE 20-12.5 MG PO TABS: 2.0000 | ORAL_TABLET | Freq: Every day | ORAL | Status: DC

## 2012-08-17 NOTE — Telephone Encounter (Signed)
Please schedule fasting labs prior to next visit  Chem7, a1c-250.00 and lipid/lft-272.4  Patient has appointment mid April and will be going to [facility] lab

## 2012-08-17 NOTE — Patient Instructions (Signed)
Please schedule fasting labs prior to next visit Chem7, a1c-250.00 and lipid/lft-272.4 

## 2012-08-20 ENCOUNTER — Telehealth: Payer: Self-pay | Admitting: *Deleted

## 2012-08-20 NOTE — Telephone Encounter (Signed)
Called patient to inform his upcomming appt with Dr Orvan Falconer and had to leave a message. His appt is set for Wednesday,08/25/12 at 230 pm.

## 2012-08-25 ENCOUNTER — Ambulatory Visit: Payer: Medicare Other | Admitting: Internal Medicine

## 2012-08-27 NOTE — Progress Notes (Signed)
  Subjective:    Patient ID: [name], male    DOB: 12/07/1945, 67 y.o.   MRN: [id number]  HPI Pt presents to clinic for followup of multiple medical problems. Notes nocturia 3x/night and wonders about contribution from diuretic. No other urinary sx's associated. BP reviewed as normotensive.   Past Medical History  Diagnosis Date  . Arthritis   . Diabetes mellitus   . History of depression   . GERD (gastroesophageal reflux disease)   . CAD (coronary artery disease)     2003- cabg  . Hypertension   . Hyperlipidemia   . History of kidney stones   . COPD (chronic obstructive pulmonary disease)   . HIV (human immunodeficiency virus infection)    Past Surgical History  Procedure Date  . Coronary artery bypass graft 05/2002  . Vasectomy     reports that he has never smoked. He has never used smokeless tobacco. He reports that he does not drink alcohol or use illicit drugs. family history includes Arthritis in an unspecified family member; Breast cancer in his maternal aunt; Diabetes in his mother; Heart disease in an unspecified family member; Hyperlipidemia in his father and mother; Hypertension in his father; Lung cancer in his maternal aunt; Mental retardation in his sister; and Stroke in his paternal grandmother. No Known Allergies    Review of Systems see hpi     Objective:   Physical Exam  Physical Exam  Nursing note and vitals reviewed. Constitutional: Appears well-developed and well-nourished. No distress.  HENT:  Head: Normocephalic and atraumatic.  Right Ear: External ear normal.  Left Ear: External ear normal.  Eyes: Conjunctivae are normal. No scleral icterus.  Neck: Neck supple. Carotid bruit is not present.  Cardiovascular: Normal rate, regular rhythm and normal heart sounds.  Exam reveals no gallop and no friction rub.   No murmur heard. Pulmonary/Chest: Effort normal and breath sounds normal. No respiratory distress. He has no wheezes. no rales.    Lymphadenopathy:    He has no cervical adenopathy.  Neurological:Alert.  Skin: Skin is warm and dry. Not diaphoretic.  Psychiatric: Has a normal mood and affect.        Assessment & Plan:

## 2012-08-27 NOTE — Assessment & Plan Note (Signed)
Good control. Continue current regimen. 

## 2012-08-27 NOTE — Assessment & Plan Note (Signed)
Decrease hctz dose. Monitor nocturia and BP.

## 2012-09-02 ENCOUNTER — Ambulatory Visit (INDEPENDENT_AMBULATORY_CARE_PROVIDER_SITE_OTHER): Payer: Medicare Other | Admitting: Internal Medicine

## 2012-09-02 ENCOUNTER — Encounter: Payer: Self-pay | Admitting: Internal Medicine

## 2012-09-02 VITALS — BP 143/78 | HR 67 | Temp 97.8°F | Ht 63.0 in | Wt 124.2 lb

## 2012-09-02 DIAGNOSIS — B20 Human immunodeficiency virus [HIV] disease: Secondary | ICD-10-CM

## 2012-09-02 NOTE — Progress Notes (Signed)
Patient ID: [name], male   DOB: 24-Jan-1946, 67 y.o.   MRN: [id number]     [facility] for Infectious Disease  Patient Active Problem List  Diagnosis  . HIV INFECTION  . GENITAL HERPES  . DIABETES MELLITUS, TYPE II  . DYSLIPIDEMIA  . DEPRESSION  . HEARING LOSS, SENSORINEURAL  . HYPERTENSION  . CORONARY ARTERY DISEASE  . PERIPHERAL VASCULAR DISEASE  . COPD  . GERD  . HEMATOCHEZIA  . HIP PAIN, BILATERAL  . KNEE PAIN, BILATERAL  . MEMORY LOSS  . WEIGHT LOSS, ABNORMAL  . INGUINAL LYMPHADENOPATHY, RIGHT  . NEPHROLITHIASIS, HX OF  . SHINGLES, HX OF  . CORONARY ARTERY BYPASS GRAFT, HX OF  . BELLS PALSY  . Lipodystrophy  . Dry eye syndrome  . Erectile dysfunction  . Dry mouth  . Night sweats    Patient's Medications  New Prescriptions   No medications on file  Previous Medications   AMLODIPINE (NORVASC) 2.5 MG TABLET    Take 1 tablet (2.5 mg total) by mouth daily.   ASPIRIN 325 MG TABLET    Take 325 mg by mouth daily.     ATENOLOL (TENORMIN) 50 MG TABLET    Take 1 tablet (50 mg total) by mouth daily.   ATORVASTATIN (LIPITOR) 20 MG TABLET    Take 1 tablet (20 mg total) by mouth daily.   COMBIVIR 150-300 MG PER TABLET    TAKE 1 TABLET BY MOUTH TWICE DAILY   FENOFIBRATE 160 MG TABLET    Take 1 tablet (160 mg total) by mouth daily.   GLUCERNA (GLUCERNA) LIQD    Take 1 Can by mouth 2 (two) times daily between meals.   GLUCOSE BLOOD TEST STRIP    Use as instructed to check blood sugar once a day Dx 250.00   LANCETS (FREESTYLE) LANCETS    Use as instructed to check blood sugar once a day Dx 250.00   LISINOPRIL-HYDROCHLOROTHIAZIDE (ZESTORETIC) 20-12.5 MG PER TABLET    Take 2 tablets by mouth daily.   METFORMIN (GLUCOPHAGE) 500 MG TABLET    Take 1 tablet (500 mg total) by mouth 2 (two) times daily with a meal.   NEVIRAPINE (VIRAMUNE) 200 MG TABLET    Take 1 tablet (200 mg total) by mouth 2 (two) times daily.   OMEPRAZOLE (PRILOSEC) 20 MG CAPSULE    TAKE 1 CAPSULE BY MOUTH  EVERY DAY . CAPSULE MAY BE OPENED AND MIXED WITH 1 TABLESPOONFUL OF APPLESAUCE   VALACYCLOVIR (VALTREX) 500 MG TABLET    as needed.   Modified Medications   No medications on file  Discontinued Medications   No medications on file    Subjective: [name] is in for a work in visit. He is recently noted some swelling in his groin.  Objective: Temp: 97.8 F (36.6 C) (01/30 1347) Temp src: Oral (01/30 1347) BP: 143/78 mmHg (01/30 1347) Pulse Rate: 67  (01/30 1347)  General: is in good spirits as usual Lymph nodes: He has some shotty, bilateral inguinal lymph nodes. They're smooth, rubbery and nontender. He has very little subcutaneous fat in his extremities which makes his easily palpable.  Lab Results HIV 1 RNA Quant (copies/mL)  Date Value  06/22/2012 <20   04/17/2011 <20   09/10/2010 39      HIV-1 RNA Viral Load (no units)  Date Value  12/17/2011 <40   09/15/2011 <40   05/27/2011 <40      CD4 T Cell Abs (cmm)  Date Value  06/22/2012 430   04/17/2011 610   07/02/2010 490      Assessment: I suspect that he has had a slight increase in his reactive adenopathy. He has had this in the past. I've asked him to give me a call if he notes them to be enlarging between now and his next visit.  Plan: 1. Observe for now 2. Continue current antiretroviral therapy 3. Followup after blood work in 3 months   Cliffton Asters, MD [facility] for Infectious Disease [facility] [phone] pager   [phone] cell 09/02/2012, 2:02 PM

## 2012-09-25 ENCOUNTER — Other Ambulatory Visit: Payer: Self-pay | Admitting: Internal Medicine

## 2012-09-29 ENCOUNTER — Ambulatory Visit (INDEPENDENT_AMBULATORY_CARE_PROVIDER_SITE_OTHER): Payer: Medicare Other | Admitting: *Deleted

## 2012-09-29 VITALS — BP 152/85 | HR 59 | Temp 98.1°F | Resp 16 | Ht 64.0 in | Wt 122.5 lb

## 2012-09-29 DIAGNOSIS — E785 Hyperlipidemia, unspecified: Secondary | ICD-10-CM

## 2012-09-29 DIAGNOSIS — B20 Human immunodeficiency virus [HIV] disease: Secondary | ICD-10-CM

## 2012-09-29 LAB — COMPREHENSIVE METABOLIC PANEL
ALT: 36 U/L (ref 0–53)
Albumin: 4.6 g/dL (ref 3.5–5.2)
CO2: 26 mEq/L (ref 19–32)
Calcium: 9.3 mg/dL (ref 8.4–10.5)
Chloride: 103 mEq/L (ref 96–112)
Glucose, Bld: 101 mg/dL — ABNORMAL HIGH (ref 70–99)
Potassium: 4.2 mEq/L (ref 3.5–5.3)
Sodium: 139 mEq/L (ref 135–145)
Total Bilirubin: 0.5 mg/dL (ref 0.3–1.2)
Total Protein: 6.7 g/dL (ref 6.0–8.3)

## 2012-09-29 LAB — PHOSPHORUS: Phosphorus: 2.9 mg/dL (ref 2.3–4.6)

## 2012-09-29 LAB — LIPID PANEL
Cholesterol: 127 mg/dL (ref 0–200)
LDL Cholesterol: 62 mg/dL (ref 0–99)
Triglycerides: 166 mg/dL — ABNORMAL HIGH (ref <150)

## 2012-09-29 NOTE — Progress Notes (Signed)
Patient here today to screen for A5314 looking at the effects of methotrexate on inflammation in HIV infected individuals. Informed consent was obtained after reviewing the consent and discussion with the subject. [name] has a significant cardiac history having had 6 bypasses done in 2003 for coronary blockages. He also has diabetes, type 2. He currently takes aspirin 325mg  daily and that drug is prohibited if he enrolls on study. We will probably need to ask his cardiologist if he can stop the aspirin while on study. He could be randomized to either methotrexate or placebo for a total of 24 weeks. After his labs result and he looks eligible, we will have him come in and see Dr. Daiva Eves for the physical and a CXR.

## 2012-09-30 LAB — HEPATITIS B SURFACE ANTIBODY,QUALITATIVE: Hep B S Ab: REACTIVE — AB

## 2012-09-30 LAB — HIV-1 RNA QUANT-NO REFLEX-BLD: HIV 1 RNA Quant: <20 copies/mL (ref <20)

## 2012-10-01 LAB — QUANTIFERON TB GOLD ASSAY (BLOOD)
Interferon Gamma Release Assay: NEGATIVE
TB Ag value: 0.04 IU/mL

## 2012-10-05 ENCOUNTER — Telehealth: Payer: Self-pay | Admitting: Internal Medicine

## 2012-10-05 MED ORDER — FENOFIBRATE 160 MG PO TABS: 160.0000 mg | ORAL_TABLET | Freq: Every day | ORAL | Status: DC

## 2012-10-05 NOTE — Telephone Encounter (Signed)
Patient is requesting a refill of fenofibrate to be sent to [facility] on [address]

## 2012-10-13 ENCOUNTER — Ambulatory Visit: Payer: Medicare Other | Admitting: Infectious Disease

## 2012-10-13 ENCOUNTER — Ambulatory Visit: Payer: Medicare Other

## 2012-10-14 ENCOUNTER — Ambulatory Visit: Payer: Medicare Other

## 2012-10-18 ENCOUNTER — Ambulatory Visit (HOSPITAL_COMMUNITY)
Admission: RE | Admit: 2012-10-18 | Discharge: 2012-10-18 | Disposition: A | Discharge status: Home / Self Care | Payer: Medicare Other | Source: Ambulatory Visit | Attending: Infectious Disease | Admitting: Infectious Disease

## 2012-10-18 ENCOUNTER — Ambulatory Visit: Payer: Medicare Other

## 2012-10-18 ENCOUNTER — Ambulatory Visit (INDEPENDENT_AMBULATORY_CARE_PROVIDER_SITE_OTHER): Payer: Medicare Other | Admitting: Infectious Disease

## 2012-10-18 ENCOUNTER — Encounter: Payer: Self-pay | Admitting: Infectious Disease

## 2012-10-18 VITALS — BP 126/75 | HR 65 | Temp 98.5°F | Ht 63.0 in | Wt 123.0 lb

## 2012-10-18 DIAGNOSIS — Z951 Presence of aortocoronary bypass graft: Secondary | ICD-10-CM

## 2012-10-18 DIAGNOSIS — Z006 Encounter for examination for normal comparison and control in clinical research program: Secondary | ICD-10-CM | POA: Insufficient documentation

## 2012-10-18 DIAGNOSIS — K219 Gastro-esophageal reflux disease without esophagitis: Secondary | ICD-10-CM

## 2012-10-18 DIAGNOSIS — Z21 Asymptomatic human immunodeficiency virus [HIV] infection status: Secondary | ICD-10-CM | POA: Insufficient documentation

## 2012-10-18 MED ORDER — CLOPIDOGREL BISULFATE 75 MG PO TABS: 75.0000 mg | ORAL_TABLET | Freq: Every day | ORAL | Status: DC

## 2012-10-18 MED ORDER — PANTOPRAZOLE SODIUM 40 MG PO TBEC: 40.0000 mg | DELAYED_RELEASE_TABLET | Freq: Every day | ORAL | Status: DC

## 2012-10-18 NOTE — Patient Instructions (Addendum)
I spoke  With Dr. Jens Som and he is of a very strong opinion that IF you were to come off of the anti platelet drug aspirin that you would need to be on a different anti platelet drug which would be plavix.  For the plavix to be maximally active we would need to take you off of your omeprazole and put you on pantoprazole instead  (Again this is ALL so that you can be in our study with methotrexate)

## 2012-10-18 NOTE — Progress Notes (Signed)
Subjective:    Patient ID: [name], male    DOB: Jan 24, 1946, 67 y.o.   MRN: [id number]  HPI  [name] is a 67 year old man with perfectly controllled HIV and comorbid, DM, HTN known CAD sp CABG in 2003 who presents to clinic for a formal Physical exam prior to his planned enrollment into ACTG Trial # 5314 "Effect of Reducing Inflammation with Low Dose Methotrexate on Inflammatory Markers and Endothelial Function in Treated and Suppressed HIV Infection, A Limited-Center Trial of the AIDS Clinical Trials Group (ACTG) Sponsored by: [employer] ([employer]) and National Heart, Lung, and Blood Institute (NHLBI).  Christian [name] is currently doing very well on his current regimen of BID combivir and bid Viramune 200mg . I raised ? Whether he would wish down the line to be changed to a QD regimen for dose simplicity  But for now he wished to remain on his current regimen and this also would be simpler for study entry.  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  Though he had no specific complaints I focused my questions on his prior hx of CAD. He underwent CABG in 2003. Since then he has had only one episode of atypical chest pain which was deemed to be due to musculoskeletal pain. He had myoview in 2009 which sohwed EF 62% and small defect in the anteiror wall c/w small prior infarct vs soft tissue attenuation. Echo in 2011 showed normal LV function, mild left ventricular hypertrophy and grade 2 diastolic dysfunction. He saw Dr. Jens Som with [facility] in Feb 2013 and was noted to have more DOE at that time. Though DOE was not not associated with chest pain. There is no orthopnea, PND or pedal edema. There is no syncope or palpitations. There is no exertional chest pain. Dr. Jens Som repeated myoview on 09/23/11 which showed normal nuclear stress test with NML LV fxn, NL wall motoin and ef 67%.  One issue that had been raised by the  current study that he seeks to enroll in was the prohibition against simulaltaneous NSAIDS including salicylates for pts enrolled into this study. For this reason I spoke to Dr. Jens Som from [facility] re idea of this pt coming off of aspirin but remaining on an antiplatelet drug in form of plavix (not exluded by study).  Dr. Jens Som was very reluctant to have the pt come off of asa but felt after further thought that using antiplatelet drug such as plavix would be acceptable alternative.   After the pts current visit I re-visited the studies MOP and specifically looked at medicines that are excluded from the study in latest version modified on 10/05/12 it is noted that  "Use of low dose aspirin of ?325 mg/day is permitted and not considered exclusionary for study entry."  Therefore this pt could also in fact remain on asa and be in the study.  We reviewed other pertinent PMHX and pt did suffer from VZV zoster with Bells palsy but this was in 2011 and not recent.      Review of Systems  Constitutional: Negative for fever, chills, diaphoresis, activity change, appetite change, fatigue and unexpected weight change.  HENT: Negative for congestion, sore throat, rhinorrhea, sneezing, trouble swallowing and sinus pressure.   Eyes: Negative for photophobia and visual disturbance.  Respiratory: Negative for cough, chest tightness, shortness of breath, wheezing and stridor.   Cardiovascular: Negative for chest pain, palpitations and leg swelling.  Gastrointestinal: Negative for nausea, vomiting, abdominal pain, diarrhea, constipation, blood in stool, abdominal distention and anal bleeding.  Genitourinary: Negative for dysuria, hematuria, flank pain and difficulty urinating.  Musculoskeletal: Negative for myalgias, back pain, joint swelling, arthralgias and gait problem.  Skin: Negative for color change, pallor, rash and wound.  Neurological: Negative for dizziness, tremors, weakness and  light-headedness.  Hematological: Negative for adenopathy. Does not bruise/bleed easily.  Psychiatric/Behavioral: Negative for behavioral problems, confusion, sleep disturbance, dysphoric mood, decreased concentration and agitation.       Objective:   Physical Exam  Constitutional: He is oriented to person, place, and time. He appears well-developed and well-nourished. No distress.  HENT:  Head: Normocephalic and atraumatic.  Mouth/Throat: Oropharynx is clear and moist. No oropharyngeal exudate.  Eyes: Conjunctivae and EOM are normal. Pupils are equal, round, and reactive to light. No scleral icterus.  Neck: Normal range of motion. Neck supple. No JVD present.  Cardiovascular: Normal rate, regular rhythm and normal heart sounds.  Exam reveals no gallop and no friction rub.   No murmur heard. Pulmonary/Chest: Effort normal and breath sounds normal. No respiratory distress. He has no wheezes. He has no rales. He exhibits no tenderness.  Abdominal: He exhibits no distension and no mass. There is no tenderness. There is no rebound and no guarding.  Musculoskeletal: He exhibits no edema and no tenderness.  Lymphadenopathy:    He has no cervical adenopathy.  Neurological: He is alert and oriented to person, place, and time. He exhibits normal muscle tone. Coordination normal.  Skin: Skin is warm and dry. He is not diaphoretic. No erythema. No pallor.  Psychiatric: He has a normal mood and affect. His behavior is normal. Judgment and thought content normal.          Assessment & Plan:   HIV : perfectly suppressed. In future (after study) he would consider dose simplification of ARVS  CAD: on statin with LDL = 62, he is also on ACEI, and atenolol. He has been on ASA. As described above he actually MAY REMAIN on ASPIRIN    ACTG Trial # 5314 "Effect of Reducing Inflammation with Low Dose Methotrexate on Inflammatory Markers and Endothelial Function in Treated and Suppressed HIV  Infection,  IF he is on it at a dose that is < 325 mg per day  Alternative which we were moving towards during the visit was change to Plavix 75mg   I will discuss one more time with Dr. Jens Som and see ? He feels more comfortable with pt with ASA <325 vs plavix.   Hyperlipidemia: on lipitor and fenofibrate  DM: on metfomin, followed by [facility] in [facility]  HTN: on HCTZ.lisinopril, atenolol, amlodipine  GERD: has been on ppi which we changed to protonix due to concern of drug drug interaction with plavix if he were to be changed to this.

## 2012-10-19 ENCOUNTER — Telehealth: Payer: Self-pay | Admitting: *Deleted

## 2012-10-19 NOTE — Telephone Encounter (Signed)
Discussed patient's anticoagulant therapy with Dr. Daiva Eves. Low dose aspirin is nonexclusionary for ACTG A5314 study while full dose aspirin is exclusionary.  Dr. Daiva Eves checked with pt's  cardiologist about changing him to a baby aspirin daily instead of the plavix. I called [name] and let him know to not start the plavix and protonix, as long as he cut the aspirin back to low dose, and he could stay on the prilosec for the study. He agreed to do that. His CXR was reviewed by Dr. Daiva Eves and he looks eligible for enrollment. Once he has had his BART exam at [facility], we will enter him on the study.

## 2012-10-23 ENCOUNTER — Other Ambulatory Visit: Payer: Self-pay | Admitting: Internal Medicine

## 2012-10-28 ENCOUNTER — Ambulatory Visit (INDEPENDENT_AMBULATORY_CARE_PROVIDER_SITE_OTHER): Payer: Medicare Other | Admitting: *Deleted

## 2012-10-28 ENCOUNTER — Other Ambulatory Visit: Payer: Self-pay | Admitting: Internal Medicine

## 2012-10-28 VITALS — BP 129/78 | HR 73 | Temp 97.8°F | Resp 16 | Ht 63.0 in | Wt 122.8 lb

## 2012-10-28 DIAGNOSIS — Z21 Asymptomatic human immunodeficiency virus [HIV] infection status: Secondary | ICD-10-CM

## 2012-10-28 DIAGNOSIS — B20 Human immunodeficiency virus [HIV] disease: Secondary | ICD-10-CM

## 2012-10-28 DIAGNOSIS — K219 Gastro-esophageal reflux disease without esophagitis: Secondary | ICD-10-CM

## 2012-10-28 LAB — COMPREHENSIVE METABOLIC PANEL
ALT: 36 U/L (ref 0–53)
Albumin: 4.3 g/dL (ref 3.5–5.2)
CO2: 23 mEq/L (ref 19–32)
Calcium: 9.4 mg/dL (ref 8.4–10.5)
Chloride: 101 mEq/L (ref 96–112)
Glucose, Bld: 231 mg/dL — ABNORMAL HIGH (ref 70–99)
Sodium: 139 mEq/L (ref 135–145)
Total Protein: 6.5 g/dL (ref 6.0–8.3)

## 2012-10-28 LAB — HIV-1 RNA QUANT-NO REFLEX-BLD: HIV-1 RNA Viral Load: <40

## 2012-10-28 LAB — CD4/CD8 (T-HELPER/T-SUPPRESSOR CELL)
CD4%: 50.7
CD8 % Suppressor T Cell: 27.5
CD8: 275

## 2012-10-28 MED ORDER — ASPIRIN 81 MG PO CHEW: 81.0000 mg | CHEWABLE_TABLET | Freq: Every day | ORAL | Status: DC

## 2012-10-28 MED ORDER — OMEPRAZOLE 20 MG PO CPDR: 20.0000 mg | DELAYED_RELEASE_CAPSULE | Freq: Every day | ORAL | Status: DC

## 2012-10-28 NOTE — Progress Notes (Signed)
Pt her for A5314 Entry visit. Both Fasting Brachial FMD's were performed on 10/27/12 and have passed. Pt is eligible and still wants to enter study. No new medications, signs and symptoms, or diagnosis to report. Non-fasting labs were drawn. Discussed side effects and instructions on how to take study medications. He will be taking Methotrexate/Placebo 5mg  capsule po once weekly along with Folic acid 1mg  po daily. Pt verbalized understanding of side effects and the need for him to contact us immediately if any appear. He took first dose, of both medications, in my office. Pt to return tomorrow for fasting lab draw and will return on Thursday, April 3rd @ 9 am for his one week visit to assess any changes and possibly increase his dose of methotrexate/Placebo. Pt received $50 gift card for study visit. Tacey Heap RN

## 2012-10-29 ENCOUNTER — Ambulatory Visit (INDEPENDENT_AMBULATORY_CARE_PROVIDER_SITE_OTHER): Payer: Medicare Other | Admitting: *Deleted

## 2012-10-29 DIAGNOSIS — Z21 Asymptomatic human immunodeficiency virus [HIV] infection status: Secondary | ICD-10-CM

## 2012-10-29 DIAGNOSIS — E119 Type 2 diabetes mellitus without complications: Secondary | ICD-10-CM

## 2012-10-29 DIAGNOSIS — B20 Human immunodeficiency virus [HIV] disease: Secondary | ICD-10-CM

## 2012-10-29 LAB — GLUCOSE, RANDOM: Glucose, Bld: 118 mg/dL — ABNORMAL HIGH (ref 70–99)

## 2012-10-29 NOTE — Progress Notes (Signed)
Pt came in for fasting blood glucose lab draw only. He denies any new symptoms since taking study medications. He received $20 gift card for this visit. His 1 week visit is scheduled for Thursday April3rd at 9am. Tacey Heap RN

## 2012-11-04 ENCOUNTER — Ambulatory Visit (INDEPENDENT_AMBULATORY_CARE_PROVIDER_SITE_OTHER): Payer: Medicare Other | Admitting: *Deleted

## 2012-11-04 ENCOUNTER — Other Ambulatory Visit: Payer: Self-pay | Admitting: *Deleted

## 2012-11-04 VITALS — BP 138/79 | HR 64 | Temp 97.9°F | Resp 16 | Wt 123.5 lb

## 2012-11-04 DIAGNOSIS — Z21 Asymptomatic human immunodeficiency virus [HIV] infection status: Secondary | ICD-10-CM

## 2012-11-04 DIAGNOSIS — B20 Human immunodeficiency virus [HIV] disease: Secondary | ICD-10-CM

## 2012-11-04 DIAGNOSIS — E119 Type 2 diabetes mellitus without complications: Secondary | ICD-10-CM

## 2012-11-04 LAB — CBC WITH DIFFERENTIAL/PLATELET
Basophils Absolute: 0 10*3/uL (ref 0.0–0.1)
Basophils Relative: 0 % (ref 0–1)
HCT: 36 % — ABNORMAL LOW (ref 39.0–52.0)
Lymphocytes Relative: 23 % (ref 12–46)
MCHC: 36.1 g/dL — ABNORMAL HIGH (ref 30.0–36.0)
Monocytes Absolute: 0.4 10*3/uL (ref 0.1–1.0)
Neutro Abs: 2.5 10*3/uL (ref 1.7–7.7)
Neutrophils Relative %: 64 % (ref 43–77)
Platelets: 178 10*3/uL (ref 150–400)
RDW: 13.8 % (ref 11.5–15.5)
WBC: 3.9 10*3/uL — ABNORMAL LOW (ref 4.0–10.5)

## 2012-11-04 LAB — COMPREHENSIVE METABOLIC PANEL
ALT: 35 U/L (ref 0–53)
AST: 28 U/L (ref 0–37)
Albumin: 4.5 g/dL (ref 3.5–5.2)
Alkaline Phosphatase: 56 U/L (ref 39–117)
BUN: 15 mg/dL (ref 6–23)
Potassium: 3.8 mEq/L (ref 3.5–5.3)

## 2012-11-04 MED ORDER — GLUCERNA PO LIQD: 1.0000 | Freq: Two times a day (BID) | ORAL | Status: DC

## 2012-11-04 NOTE — Progress Notes (Signed)
Patient here for week 1 safety visit. He denies any new problems, no SOB, cough or signs of bleeding. He was given his methotraxate/placebo and told to hold it until I call him tomorrow once his labs from today have been reviewed. He will either be able to take 1 or 2 pills/week depending on his labs. He will return next Friday for more safety labs. He has been taking his folic acid everyday as instructed.

## 2012-11-12 ENCOUNTER — Ambulatory Visit (INDEPENDENT_AMBULATORY_CARE_PROVIDER_SITE_OTHER): Payer: Medicare Other | Admitting: *Deleted

## 2012-11-12 VITALS — BP 147/79 | HR 59 | Temp 97.5°F | Resp 16 | Wt 126.2 lb

## 2012-11-12 DIAGNOSIS — B20 Human immunodeficiency virus [HIV] disease: Secondary | ICD-10-CM

## 2012-11-12 DIAGNOSIS — Z21 Asymptomatic human immunodeficiency virus [HIV] infection status: Secondary | ICD-10-CM

## 2012-11-12 LAB — COMPREHENSIVE METABOLIC PANEL
ALT: 31 U/L (ref 0–53)
AST: 27 U/L (ref 0–37)
CO2: 26 mEq/L (ref 19–32)
Calcium: 9 mg/dL (ref 8.4–10.5)
Chloride: 103 mEq/L (ref 96–112)
Creat: 0.82 mg/dL (ref 0.50–1.35)
Sodium: 137 mEq/L (ref 135–145)
Total Protein: 6.1 g/dL (ref 6.0–8.3)

## 2012-11-12 LAB — CBC WITH DIFFERENTIAL/PLATELET
Basophils Absolute: 0 10*3/uL (ref 0.0–0.1)
Eosinophils Relative: 2 % (ref 0–5)
Lymphocytes Relative: 22 % (ref 12–46)
Lymphs Abs: 1 10*3/uL (ref 0.7–4.0)
MCV: 118.4 fL — ABNORMAL HIGH (ref 78.0–100.0)
Neutro Abs: 2.8 10*3/uL (ref 1.7–7.7)
Neutrophils Relative %: 64 % (ref 43–77)
Platelets: 187 10*3/uL (ref 150–400)
RBC: 3.05 MIL/uL — ABNORMAL LOW (ref 4.22–5.81)
RDW: 14.4 % (ref 11.5–15.5)
WBC: 4.5 10*3/uL (ref 4.0–10.5)

## 2012-11-12 NOTE — Progress Notes (Signed)
Patient here for his week 2 study visit. Denies any new problems. He has been continuing his folic acid every day and 2 methotrexate every Friday starting last week. He will return in 2 weeks for his next study visit.

## 2012-11-15 ENCOUNTER — Telehealth: Payer: Self-pay

## 2012-11-15 DIAGNOSIS — E119 Type 2 diabetes mellitus without complications: Secondary | ICD-10-CM

## 2012-11-15 DIAGNOSIS — E785 Hyperlipidemia, unspecified: Secondary | ICD-10-CM

## 2012-11-15 LAB — BASIC METABOLIC PANEL
BUN: 22 mg/dL (ref 6–23)
CO2: 28 mEq/L (ref 19–32)
Glucose, Bld: 138 mg/dL — ABNORMAL HIGH (ref 70–99)
Potassium: 4.2 mEq/L (ref 3.5–5.3)
Sodium: 138 mEq/L (ref 135–145)

## 2012-11-15 LAB — HEPATIC FUNCTION PANEL
AST: 29 U/L (ref 0–37)
Albumin: 4.1 g/dL (ref 3.5–5.2)
Alkaline Phosphatase: 43 U/L (ref 39–117)
Total Protein: 6.4 g/dL (ref 6.0–8.3)

## 2012-11-15 LAB — LIPID PANEL
Cholesterol: 127 mg/dL (ref 0–200)
HDL: 29 mg/dL — ABNORMAL LOW (ref >39)

## 2012-11-15 NOTE — Telephone Encounter (Signed)
Labs ordered.

## 2012-11-16 ENCOUNTER — Ambulatory Visit: Payer: Medicare Other | Admitting: Family Medicine

## 2012-11-22 ENCOUNTER — Encounter: Payer: Self-pay | Admitting: Internal Medicine

## 2012-11-24 ENCOUNTER — Other Ambulatory Visit: Payer: Self-pay | Admitting: Internal Medicine

## 2012-11-25 ENCOUNTER — Ambulatory Visit (INDEPENDENT_AMBULATORY_CARE_PROVIDER_SITE_OTHER): Payer: Medicare Other | Admitting: Family Medicine

## 2012-11-25 ENCOUNTER — Encounter: Payer: Self-pay | Admitting: Family Medicine

## 2012-11-25 VITALS — BP 118/74 | HR 58 | Temp 97.5°F | Ht 63.0 in | Wt 124.1 lb

## 2012-11-25 DIAGNOSIS — E785 Hyperlipidemia, unspecified: Secondary | ICD-10-CM | POA: Diagnosis not present

## 2012-11-25 DIAGNOSIS — I10 Essential (primary) hypertension: Secondary | ICD-10-CM

## 2012-11-25 DIAGNOSIS — L309 Dermatitis, unspecified: Secondary | ICD-10-CM

## 2012-11-25 DIAGNOSIS — L259 Unspecified contact dermatitis, unspecified cause: Secondary | ICD-10-CM | POA: Diagnosis not present

## 2012-11-25 DIAGNOSIS — E119 Type 2 diabetes mellitus without complications: Secondary | ICD-10-CM | POA: Diagnosis not present

## 2012-11-25 MED ORDER — TRIAMCINOLONE ACETONIDE 0.1 % EX CREA: TOPICAL_CREAM | Freq: Every evening | CUTANEOUS | Status: DC | PRN

## 2012-11-25 NOTE — Patient Instructions (Addendum)
Labs prior lipid, renal, cbc, tsh, hepatic, hgba1c  onsider a krill oil cap daily such as MegaRed   Consider [employer] Astringent cleanse daily  Keep putting the Bacitracin on in the am   Cholesterol Cholesterol is a white, waxy, fat-like protein needed by your body in small amounts. The liver makes all the cholesterol you need. It is carried from the liver by the blood through the blood vessels. Deposits (plaque) may build up on blood vessel walls. This makes the arteries narrower and stiffer. Plaque increases the risk for heart attack and stroke. You cannot feel your cholesterol level even if it is very high. The only way to know is by a blood test to check your lipid (fats) levels. Once you know your cholesterol levels, you should keep a record of the test results. Work with your caregiver to to keep your levels in the desired range. WHAT THE RESULTS MEAN:  Total cholesterol is a rough measure of all the cholesterol in your blood.  LDL is the so-called bad cholesterol. This is the type that deposits cholesterol in the walls of the arteries. You want this level to be low.  HDL is the good cholesterol because it cleans the arteries and carries the LDL away. You want this level to be high.  Triglycerides are fat that the body can either burn for energy or store. High levels are closely linked to heart disease. DESIRED LEVELS:  Total cholesterol below 200.  LDL below 100 for people at risk, below 70 for very high risk.  HDL above 50 is good, above 60 is best.  Triglycerides below 150. HOW TO LOWER YOUR CHOLESTEROL:  Diet.  Choose fish or white meat chicken and [place], roasted or baked. Limit fatty cuts of red meat, fried foods, and processed meats, such as sausage and lunch meat.  Eat lots of fresh fruits and vegetables. Choose whole grains, beans, pasta, potatoes and cereals.  Use only small amounts of olive, corn or canola oils. Avoid butter, mayonnaise,  shortening or palm kernel oils. Avoid foods with trans-fats.  Use skim/nonfat milk and low-fat/nonfat yogurt and cheeses. Avoid whole milk, cream, ice cream, egg yolks and cheeses. Healthy desserts include angel food cake, ginger snaps, animal crackers, hard candy, popsicles, and low-fat/nonfat frozen yogurt. Avoid pastries, cakes, pies and cookies.  Exercise.  A regular program helps decrease LDL and raises HDL.  Helps with weight control.  Do things that increase your activity level like gardening, walking, or taking the stairs.  Medication.  May be prescribed by your caregiver to help lowering cholesterol and the risk for heart disease.  You may need medicine even if your levels are normal if you have several risk factors. HOME CARE INSTRUCTIONS   Follow your diet and exercise programs as suggested by your caregiver.  Take medications as directed.  Have blood work done when your caregiver feels it is necessary. MAKE SURE YOU:   Understand these instructions.  Will watch your condition.  Will get help right away if you are not doing well or get worse. Document Released: 04/15/2001 Document Revised: 10/13/2011 Document Reviewed: 10/06/2007 [facility] Patient Information 2013 [place], [place].

## 2012-11-25 NOTE — Assessment & Plan Note (Addendum)
Well controlled on current meds, no changes 

## 2012-11-26 ENCOUNTER — Ambulatory Visit (INDEPENDENT_AMBULATORY_CARE_PROVIDER_SITE_OTHER): Payer: Medicare Other | Admitting: *Deleted

## 2012-11-26 VITALS — BP 139/77 | HR 58 | Temp 97.7°F | Resp 16 | Wt 124.0 lb

## 2012-11-26 DIAGNOSIS — B20 Human immunodeficiency virus [HIV] disease: Secondary | ICD-10-CM

## 2012-11-26 DIAGNOSIS — Z21 Asymptomatic human immunodeficiency virus [HIV] infection status: Secondary | ICD-10-CM

## 2012-11-26 LAB — COMPREHENSIVE METABOLIC PANEL
Albumin: 4.2 g/dL (ref 3.5–5.2)
BUN: 17 mg/dL (ref 6–23)
Calcium: 9.4 mg/dL (ref 8.4–10.5)
Chloride: 102 mEq/L (ref 96–112)
Creat: 0.94 mg/dL (ref 0.50–1.35)
Glucose, Bld: 139 mg/dL — ABNORMAL HIGH (ref 70–99)
Potassium: 4.8 mEq/L (ref 3.5–5.3)

## 2012-11-26 NOTE — Progress Notes (Signed)
Patient here for his week 4 study visit. He denies any new problems or side effects of the drugs (cough, sob, etc.) he says he feels like he is a little less achy than he was before starting the folic acid and methotrexate. He saw his primary MD yesterday who recommended he start fish oil. The study does not want any changes in lipid treatment while it is in process, so I told him to wait to start as long as it is ok with his MD. He will return in 4 weeks for the next study visit.

## 2012-11-27 ENCOUNTER — Other Ambulatory Visit: Payer: Self-pay | Admitting: Internal Medicine

## 2012-11-27 ENCOUNTER — Encounter: Payer: Self-pay | Admitting: Family Medicine

## 2012-11-27 DIAGNOSIS — L309 Dermatitis, unspecified: Secondary | ICD-10-CM | POA: Insufficient documentation

## 2012-11-27 HISTORY — DX: Dermatitis, unspecified: L30.9

## 2012-11-27 NOTE — Assessment & Plan Note (Signed)
Avoid trans fats, continue statin

## 2012-11-27 NOTE — Progress Notes (Signed)
Patient ID: [name], male   DOB: June 29, 1946, 67 y.o.   MRN: [id number] ARKEEM HARTS [id number] 1946-02-26 11/27/2012      Progress Note-Follow Up  Subjective  Chief Complaint  Chief Complaint  Patient presents with  . Follow-up    3 month    HPI  Patient is a 67 year old Caucasian male who is in today for routine followup. Feeling well. He notes his morning blood sugars tend to be 110 to 120. No polyuria or polydipsia. No fevers or chills. No chest pain, palpitations, shortness of breath. Taking medications as prescribed. No recent illness or headaches. No GI or GU complaints. Takes Citrucel daily. Does have a rash on his right hand which is mildly pruritic he has tried Neosporin which has helped very slightly.  Past Medical History  Diagnosis Date  . Arthritis   . Diabetes mellitus   . History of depression   . GERD (gastroesophageal reflux disease)   . CAD (coronary artery disease)     2003- cabg  . Hypertension   . Hyperlipidemia   . History of kidney stones   . COPD (chronic obstructive pulmonary disease)   . HIV (human immunodeficiency virus infection)   . Dermatitis 11/27/2012    Past Surgical History  Procedure Laterality Date  . Coronary artery bypass graft  05/2002  . Vasectomy      Family History  Problem Relation Age of Onset  . Arthritis      mother/father/paternal grandparents  . Breast cancer Maternal Aunt     paternal aunt  . Lung cancer Maternal Aunt   . Hyperlipidemia Mother   . Hyperlipidemia Father   . Heart disease      parents/maternal grandparents/ 2 brothers  . Stroke Paternal Grandmother   . Hypertension Father     paternal grandmother/3 brothers/1 sister  . Mental retardation Sister   . Diabetes Mother     paternal grandparents/1 brother    History   Social History  . Marital Status: Divorced    Spouse Name: N/A    Number of Children: 4  . Years of Education: N/A   Occupational History  . Retired     Disability    Social History Main Topics  . Smoking status: Never Smoker   . Smokeless tobacco: Never Used  . Alcohol Use: No  . Drug Use: No  . Sexually Active: Not Currently     Comment: declined condoms   Other Topics Concern  . Not on file   Social History Narrative  . No narrative on file    Current Outpatient Prescriptions on File Prior to Visit  Medication Sig Dispense Refill  . amLODipine (NORVASC) 2.5 MG tablet Take 1 tablet (2.5 mg total) by mouth daily.  30 tablet  6  . aspirin (ASPIRIN CHILDRENS) 81 MG chewable tablet Chew 1 tablet (81 mg total) by mouth daily.  30 tablet  11  . atenolol (TENORMIN) 50 MG tablet Take 1 tablet (50 mg total) by mouth daily.  30 tablet  6  . atorvastatin (LIPITOR) 20 MG tablet Take 1 tablet (20 mg total) by mouth daily.  30 tablet  6  . COMBIVIR 150-300 MG per tablet TAKE 1 TABLET BY MOUTH TWICE DAILY  60 tablet  10  . fenofibrate 160 MG tablet Take 1 tablet (160 mg total) by mouth daily.  30 tablet  11  . FREESTYLE TEST STRIPS test strip USE AS DIRECTED TO CHECK BLOOD SUGAR ONCE A DAY  100  each  1  . Glucerna (GLUCERNA) LIQD Take 1 Can by mouth 2 (two) times daily between meals.  48 Can  5  . Lancets (FREESTYLE) lancets USE AS DIRECTED TO CHECK BLOOD SUGAR ONCE A DAY  100 each  1  . lisinopril-hydrochlorothiazide (ZESTORETIC) 20-12.5 MG per tablet Take 2 tablets by mouth daily.  60 tablet  6  . metFORMIN (GLUCOPHAGE) 500 MG tablet Take 1 tablet (500 mg total) by mouth 2 (two) times daily with a meal.  60 tablet  6  . nevirapine (VIRAMUNE) 200 MG tablet Take 1 tablet (200 mg total) by mouth 2 (two) times daily.  60 tablet  12  . omeprazole (PRILOSEC) 20 MG capsule Take 1 capsule (20 mg total) by mouth daily.  30 capsule  11  . pantoprazole (PROTONIX) 40 MG tablet Take 1 tablet (40 mg total) by mouth daily.  30 tablet  11  . valACYclovir (VALTREX) 500 MG tablet TAKE 1 TABLET BY MOUTH TWICE DAILY  60 tablet  0   No current facility-administered  medications on file prior to visit.    No Known Allergies  Review of Systems  Review of Systems  Constitutional: Negative for fever and malaise/fatigue.  HENT: Negative for congestion.   Eyes: Negative for discharge.  Respiratory: Negative for shortness of breath.   Cardiovascular: Negative for chest pain, palpitations and leg swelling.  Gastrointestinal: Negative for nausea, abdominal pain and diarrhea.  Genitourinary: Negative for dysuria.  Musculoskeletal: Negative for falls.  Skin: Negative for rash.  Neurological: Negative for loss of consciousness and headaches.  Endo/Heme/Allergies: Negative for polydipsia.  Psychiatric/Behavioral: Negative for depression and suicidal ideas. The patient is not nervous/anxious and does not have insomnia.     Objective  BP 118/74  Pulse 58  Temp(Src) 97.5 F (36.4 C) (Oral)  Ht 5\' 3"  (1.6 m)  Wt 124 lb 1.9 oz (56.3 kg)  BMI 21.99 kg/m2  SpO2 99%  Physical Exam  Physical Exam  Constitutional: He is oriented to person, place, and time and well-developed, well-nourished, and in no distress. No distress.  HENT:  Head: Normocephalic and atraumatic.  Eyes: Conjunctivae are normal.  Neck: Neck supple. No thyromegaly present.  Cardiovascular: Normal rate, regular rhythm and normal heart sounds.   No murmur heard. Pulmonary/Chest: Effort normal and breath sounds normal. No respiratory distress.  Abdominal: He exhibits no distension and no mass. There is no tenderness.  Musculoskeletal: He exhibits no edema.  Neurological: He is alert and oriented to person, place, and time.  Skin: Skin is warm.  Psychiatric: Memory, affect and judgment normal.    Lab Results  Component Value Date   TSH 1.742 07/19/2010   Lab Results  Component Value Date   WBC 4.5 11/12/2012   HGB 12.9* 11/12/2012   HCT 36.1* 11/12/2012   MCV 118.4* 11/12/2012   PLT 187 11/12/2012   Lab Results  Component Value Date   CREATININE 0.94 11/26/2012   BUN 17  11/26/2012   NA 136 11/26/2012   K 4.8 11/26/2012   CL 102 11/26/2012   CO2 28 11/26/2012   Lab Results  Component Value Date   ALT 36 11/26/2012   AST 45* 11/26/2012   ALKPHOS 51 11/26/2012   BILITOT 0.5 11/26/2012   Lab Results  Component Value Date   CHOL 127 11/15/2012   Lab Results  Component Value Date   HDL 29* 11/15/2012   Lab Results  Component Value Date   LDLCALC 64 11/15/2012   Lab  Results  Component Value Date   TRIG 168* 11/15/2012   Lab Results  Component Value Date   CHOLHDL 4.4 11/15/2012     Assessment & Plan  HYPERTENSION Well controlled on current meds, no changes  Dermatitis Right hand, given rx for Triamcinolone to use qhs, if no improvement needs referral to dermatology  DIABETES MELLITUS, TYPE II Well controlled at this time, no change to meds  DYSLIPIDEMIA Avoid trans fats, continue statin

## 2012-11-27 NOTE — Assessment & Plan Note (Signed)
Right hand, given rx for Triamcinolone to use qhs, if no improvement needs referral to dermatology

## 2012-11-27 NOTE — Assessment & Plan Note (Signed)
Well controlled at this time, no change to meds

## 2012-11-29 ENCOUNTER — Other Ambulatory Visit: Payer: Self-pay | Admitting: *Deleted

## 2012-11-29 DIAGNOSIS — B029 Zoster without complications: Secondary | ICD-10-CM

## 2012-11-29 MED ORDER — VALACYCLOVIR HCL 500 MG PO TABS: ORAL_TABLET | ORAL | Status: DC

## 2012-12-07 ENCOUNTER — Telehealth: Payer: Self-pay | Admitting: Family Medicine

## 2012-12-07 MED ORDER — GLUCOSE BLOOD VI STRP: ORAL_STRIP | Status: DC

## 2012-12-07 NOTE — Telephone Encounter (Signed)
FREESTYLE TEST STRIPS test strip #100

## 2012-12-17 ENCOUNTER — Encounter: Payer: Self-pay | Admitting: Internal Medicine

## 2012-12-17 LAB — CD4/CD8 (T-HELPER/T-SUPPRESSOR CELL)
CD4%: 53.1
CD4: 584
CD8 % Suppressor T Cell: 27.8
CD8: 306

## 2012-12-23 ENCOUNTER — Ambulatory Visit (INDEPENDENT_AMBULATORY_CARE_PROVIDER_SITE_OTHER): Payer: Medicare Other | Admitting: *Deleted

## 2012-12-23 ENCOUNTER — Encounter: Payer: Self-pay | Admitting: *Deleted

## 2012-12-23 VITALS — BP 134/75 | HR 56 | Temp 97.5°F | Resp 14 | Wt 126.2 lb

## 2012-12-23 DIAGNOSIS — B20 Human immunodeficiency virus [HIV] disease: Secondary | ICD-10-CM

## 2012-12-23 DIAGNOSIS — Z21 Asymptomatic human immunodeficiency virus [HIV] infection status: Secondary | ICD-10-CM

## 2012-12-23 LAB — COMPREHENSIVE METABOLIC PANEL
Albumin: 4.5 g/dL (ref 3.5–5.2)
Alkaline Phosphatase: 40 U/L (ref 39–117)
BUN: 16 mg/dL (ref 6–23)
Creat: 0.92 mg/dL (ref 0.50–1.35)
Glucose, Bld: 117 mg/dL — ABNORMAL HIGH (ref 70–99)
Total Bilirubin: 0.6 mg/dL (ref 0.3–1.2)

## 2012-12-23 NOTE — Progress Notes (Signed)
Pt here for A5314, week 8. He is adhering to his study medications as well as his ART. He denies any unexplained coughing and/or SOB. He also denies any other symptoms. Fasting labs were drawn. Vital signs stable. Medications dispensed. He received $50.00 gift card for study visit. Next appointment scheduled for Tuesday, January 18, 2013 @ 9am. He will slo go for FMD at [facility] this week: appt pending. Tacey Heap RN

## 2012-12-28 ENCOUNTER — Other Ambulatory Visit: Payer: Medicare Other

## 2013-01-02 ENCOUNTER — Encounter: Payer: Self-pay | Admitting: Family Medicine

## 2013-01-02 ENCOUNTER — Encounter: Payer: Self-pay | Admitting: Internal Medicine

## 2013-01-03 ENCOUNTER — Telehealth: Payer: Self-pay

## 2013-01-03 ENCOUNTER — Encounter: Payer: Self-pay | Admitting: Internal Medicine

## 2013-01-03 ENCOUNTER — Other Ambulatory Visit: Payer: Self-pay | Admitting: Family Medicine

## 2013-01-03 DIAGNOSIS — L989 Disorder of the skin and subcutaneous tissue, unspecified: Secondary | ICD-10-CM

## 2013-01-03 NOTE — Telephone Encounter (Signed)
I spoke with pt and he stated to proceed with dermatology

## 2013-01-03 NOTE — Telephone Encounter (Signed)
Referral completed

## 2013-01-03 NOTE — Telephone Encounter (Signed)
Message copied by Court Joy on Mon Jan 03, 2013  3:22 PM ------      Message from: Danise Edge A      Created: Mon Jan 03, 2013  3:01 PM      Regarding: FW: Your message may not be read      Contact: [phone]       This failed to go through can you check with patient and see if he wants to go see a dermatology      ----- Message -----         From: Generic Mychart         Sent: 01/03/2013   1:17 PM           To: Bradd Canary, MD      Subject: Your message may not be read                                ----- Delivery failure of internet email alert          ----- Failed to log attempted delivery of internet email alert       Tickler type: Message      Message Id(WMG): [id number]      SMTP Response: 410      Patient: [name],[name]([id number])      Recipient: ()      Internet alert email: [email or web address]               ----- Original WMG message to the patient -----      Sent: 01/03/2013  1:12 PM      From: Bradd Canary, MD      To: Flammia,Hristopher W      Message Type: Patient Medical Advice Request      Subject: RE: Non-Urgent Medical Question      Glad it is better, let me know if it does not fully resolve and you are ready to go see a dermatologist            ----- Message -----         From: Mervyn Skeeters         Sent: 01/02/2013 10:12 PM EDT           To: Danise Edge, MD      Subject: Non-Urgent Medical Question            The spot in my hand is better, but has not cleared up.  Just wanted to let you know.       ------

## 2013-01-04 ENCOUNTER — Telehealth: Payer: Self-pay | Admitting: General Practice

## 2013-01-04 NOTE — Telephone Encounter (Signed)
Called pt to find out how many times he is testing his sugar daily, and if he is on any insulin.

## 2013-01-04 NOTE — Telephone Encounter (Signed)
Pt stated he tests once a day and is not on insulin. Paperwork faxed to [employer] and sent for scanning.

## 2013-01-05 ENCOUNTER — Encounter: Payer: Self-pay | Admitting: Cardiology

## 2013-01-05 ENCOUNTER — Ambulatory Visit (INDEPENDENT_AMBULATORY_CARE_PROVIDER_SITE_OTHER): Payer: Medicare Other | Admitting: Cardiology

## 2013-01-05 VITALS — BP 140/80 | HR 60 | Ht 63.0 in | Wt 126.0 lb

## 2013-01-05 DIAGNOSIS — E785 Hyperlipidemia, unspecified: Secondary | ICD-10-CM | POA: Diagnosis not present

## 2013-01-05 DIAGNOSIS — I2510 Atherosclerotic heart disease of native coronary artery without angina pectoris: Secondary | ICD-10-CM

## 2013-01-05 DIAGNOSIS — I10 Essential (primary) hypertension: Secondary | ICD-10-CM | POA: Diagnosis not present

## 2013-01-05 NOTE — Assessment & Plan Note (Signed)
Continue statin. Lipids and liver monitored by primary care. 

## 2013-01-05 NOTE — Assessment & Plan Note (Signed)
Blood pressure controlled. Continue present medications. Potassium and renal function monitored by primary care. 

## 2013-01-05 NOTE — Assessment & Plan Note (Signed)
Continue aspirin and statin. 

## 2013-01-05 NOTE — Patient Instructions (Addendum)
Your physician wants you to follow-up in: ONE YEAR WITH DR CRENSHAW You will receive a reminder letter in the mail two months in advance. If you don't receive a letter, please call our office to schedule the follow-up appointment.  

## 2013-01-05 NOTE — Progress Notes (Signed)
HPI: Pleasant male for evaluation of coronary artery disease. Patient is status post coronary artery bypass and graft in 2003. Echocardiogram in December of 2011 revealed normal LV function, mild left ventricular hypertrophy and grade 2 diastolic dysfunction. Myoview in February 2013 showed an ejection fraction of 67% and normal perfusion. He was last seen in Feb 2013. Since then, the patient denies any dyspnea on exertion, orthopnea, PND, pedal edema, palpitations, syncope or chest pain.   Current Outpatient Prescriptions  Medication Sig Dispense Refill  . amLODipine (NORVASC) 2.5 MG tablet Take 1 tablet (2.5 mg total) by mouth daily.  30 tablet  6  . aspirin (ASPIRIN CHILDRENS) 81 MG chewable tablet Chew 1 tablet (81 mg total) by mouth daily.  30 tablet  11  . atenolol (TENORMIN) 50 MG tablet Take 1 tablet (50 mg total) by mouth daily.  30 tablet  6  . atorvastatin (LIPITOR) 20 MG tablet Take 1 tablet (20 mg total) by mouth daily.  30 tablet  6  . fenofibrate 160 MG tablet Take 1 tablet (160 mg total) by mouth daily.  30 tablet  11  . Glucerna (GLUCERNA) LIQD Take 1 Can by mouth 2 (two) times daily between meals.  48 Can  5  . glucose blood (FREESTYLE TEST STRIPS) test strip Check blood sugar once a day  DX 250.00  100 each  1  . lamiVUDine-zidovudine (COMBIVIR) 150-300 MG per tablet TAKE 1 TABLET BY MOUTH TWICE DAILY  60 tablet  3  . Lancets (FREESTYLE) lancets USE AS DIRECTED TO CHECK BLOOD SUGAR ONCE A DAY  100 each  1  . lisinopril-hydrochlorothiazide (ZESTORETIC) 20-12.5 MG per tablet Take 2 tablets by mouth daily.  60 tablet  6  . metFORMIN (GLUCOPHAGE) 500 MG tablet Take 1 tablet (500 mg total) by mouth 2 (two) times daily with a meal.  60 tablet  6  . nevirapine (VIRAMUNE) 200 MG tablet Take 1 tablet (200 mg total) by mouth 2 (two) times daily.  60 tablet  12  . omeprazole (PRILOSEC) 20 MG capsule Take 1 capsule (20 mg total) by mouth daily.  30 capsule  11  . triamcinolone cream  (KENALOG) 0.1 % Apply topically at bedtime as needed.  45 g  1  . valACYclovir (VALTREX) 500 MG tablet TAKE 1 TABLET BY MOUTH TWICE DAILY  60 tablet  0   No current facility-administered medications for this visit.     Past Medical History  Diagnosis Date  . Arthritis   . Diabetes mellitus   . History of depression   . GERD (gastroesophageal reflux disease)   . CAD (coronary artery disease)     2003- cabg  . Hypertension   . Hyperlipidemia   . History of kidney stones   . COPD (chronic obstructive pulmonary disease)   . HIV (human immunodeficiency virus infection)   . Dermatitis 11/27/2012    Past Surgical History  Procedure Laterality Date  . Coronary artery bypass graft  05/2002  . Vasectomy      History   Social History  . Marital Status: Divorced    Spouse Name: N/A    Number of Children: 4  . Years of Education: N/A   Occupational History  . Retired     Disability   Social History Main Topics  . Smoking status: Never Smoker   . Smokeless tobacco: Never Used  . Alcohol Use: No  . Drug Use: No  . Sexually Active: Not Currently  Comment: declined condoms   Other Topics Concern  . Not on file   Social History Narrative  . No narrative on file    ROS: no fevers or chills, productive cough, hemoptysis, dysphasia, odynophagia, melena, hematochezia, dysuria, hematuria, rash, seizure activity, orthopnea, PND, pedal edema, claudication. Remaining systems are negative.  Physical Exam: Well-developed well-nourished in no acute distress.  Skin is warm and dry.  HEENT is normal.  Neck is supple.  Chest is clear to auscultation with normal expansion. Previous sternotomy Cardiovascular exam is regular rate and rhythm.  Abdominal exam nontender or distended. No masses palpated. Extremities show no edema. neuro grossly intact  ECG sinus rhythm at a rate of 60. Left ventricular hypertrophy. Cannot rule out prior inferior infarct.

## 2013-01-11 ENCOUNTER — Encounter: Payer: Self-pay | Admitting: Internal Medicine

## 2013-01-11 ENCOUNTER — Ambulatory Visit (INDEPENDENT_AMBULATORY_CARE_PROVIDER_SITE_OTHER): Payer: Medicare Other | Admitting: Internal Medicine

## 2013-01-11 VITALS — BP 111/62 | HR 69 | Temp 97.8°F | Wt 120.0 lb

## 2013-01-11 DIAGNOSIS — R590 Localized enlarged lymph nodes: Secondary | ICD-10-CM | POA: Insufficient documentation

## 2013-01-11 DIAGNOSIS — B20 Human immunodeficiency virus [HIV] disease: Secondary | ICD-10-CM

## 2013-01-11 DIAGNOSIS — R599 Enlarged lymph nodes, unspecified: Secondary | ICD-10-CM | POA: Diagnosis not present

## 2013-01-11 DIAGNOSIS — Z113 Encounter for screening for infections with a predominantly sexual mode of transmission: Secondary | ICD-10-CM

## 2013-01-11 NOTE — Progress Notes (Signed)
Patient ID: [name], male   DOB: 03-08-1946, 67 y.o.   MRN: [id number]          [facility] for Infectious Disease  Patient Active Problem List   Diagnosis Date Noted  . DIABETES MELLITUS, TYPE II 09/03/2006    Priority: High  . HIV INFECTION 05/12/2006    Priority: High  . DYSLIPIDEMIA 05/12/2006    Priority: High  . CORONARY ARTERY DISEASE 05/12/2006    Priority: High  . Inguinal adenopathy 01/11/2013  . Dermatitis 11/27/2012  . Dry mouth 07/06/2012  . Night sweats 07/06/2012  . Erectile dysfunction 01/01/2012  . Lipodystrophy 12/20/2010  . Dry eye syndrome 12/20/2010  . BELLS PALSY 07/19/2010  . GENITAL HERPES 05/03/2009  . SHINGLES, HX OF 05/03/2009  . WEIGHT LOSS, ABNORMAL 04/03/2009  . INGUINAL LYMPHADENOPATHY, RIGHT 04/03/2009  . MEMORY LOSS 09/18/2008  . PERIPHERAL VASCULAR DISEASE 07/20/2008  . COPD 07/20/2008  . HIP PAIN, BILATERAL 07/17/2008  . KNEE PAIN, BILATERAL 07/17/2008  . HEMATOCHEZIA 04/18/2008  . NEPHROLITHIASIS, HX OF 02/22/2008  . DEPRESSION 09/03/2006  . GERD 09/03/2006  . CORONARY ARTERY BYPASS GRAFT, HX OF 09/03/2006  . HEARING LOSS, SENSORINEURAL 05/12/2006  . HYPERTENSION 05/12/2006    Patient's Medications  New Prescriptions   No medications on file  Previous Medications   AMLODIPINE (NORVASC) 2.5 MG TABLET    Take 1 tablet (2.5 mg total) by mouth daily.   ASPIRIN (ASPIRIN CHILDRENS) 81 MG CHEWABLE TABLET    Chew 1 tablet (81 mg total) by mouth daily.   ATENOLOL (TENORMIN) 50 MG TABLET    Take 1 tablet (50 mg total) by mouth daily.   ATORVASTATIN (LIPITOR) 20 MG TABLET    Take 1 tablet (20 mg total) by mouth daily.   FENOFIBRATE 160 MG TABLET    Take 1 tablet (160 mg total) by mouth daily.   FOLIC ACID (FOLVITE) 1 MG TABLET    Take 1 mg by mouth daily.   GLUCERNA (GLUCERNA) LIQD    Take 1 Can by mouth 2 (two) times daily between meals.   GLUCOSE BLOOD (FREESTYLE TEST STRIPS) TEST STRIP    Check blood sugar once a day  DX  250.00   LAMIVUDINE-ZIDOVUDINE (COMBIVIR) 150-300 MG PER TABLET    TAKE 1 TABLET BY MOUTH TWICE DAILY   LANCETS (FREESTYLE) LANCETS    USE AS DIRECTED TO CHECK BLOOD SUGAR ONCE A DAY   LISINOPRIL-HYDROCHLOROTHIAZIDE (ZESTORETIC) 20-12.5 MG PER TABLET    Take 2 tablets by mouth daily.   METFORMIN (GLUCOPHAGE) 500 MG TABLET    Take 1 tablet (500 mg total) by mouth 2 (two) times daily with a meal.   METHOTREXATE (RHEUMATREX) 2.5 MG TABLET    Take 2.5 mg by mouth once a week. Caution:Chemotherapy. Protect from light.   NEVIRAPINE (VIRAMUNE) 200 MG TABLET    Take 1 tablet (200 mg total) by mouth 2 (two) times daily.   OMEPRAZOLE (PRILOSEC) 20 MG CAPSULE    Take 1 capsule (20 mg total) by mouth daily.   TRIAMCINOLONE CREAM (KENALOG) 0.1 %    Apply topically at bedtime as needed.   VALACYCLOVIR (VALTREX) 500 MG TABLET    TAKE 1 TABLET BY MOUTH TWICE DAILY  Modified Medications   No medications on file  Discontinued Medications   No medications on file    Subjective: [name] is in for his routine visit. His only missed one dose of his Combivir and Viramune since his last visit in that occurred when  his pharmacy mistakingly told him that it was no longer covered by [employer]. He started on in a research study looking at the metabolic effects of methotrexate. He is taking study drug once weekly along with folic acid and is tolerating both well. His been a little bit concerned about intermittent increase in the size of his inguinal adenopathy. It is not tender or and currently is back to his baseline size. Review of Systems: Pertinent items are noted in HPI.  Past Medical History  Diagnosis Date  . Arthritis   . Diabetes mellitus   . History of depression   . GERD (gastroesophageal reflux disease)   . CAD (coronary artery disease)     2003- cabg  . Hypertension   . Hyperlipidemia   . History of kidney stones   . COPD (chronic obstructive pulmonary disease)   . HIV (human immunodeficiency virus  infection)   . Dermatitis 11/27/2012    History  Substance Use Topics  . Smoking status: Never Smoker   . Smokeless tobacco: Never Used  . Alcohol Use: No    Family History  Problem Relation Age of Onset  . Arthritis      mother/father/paternal grandparents  . Breast cancer Maternal Aunt     paternal aunt  . Lung cancer Maternal Aunt   . Hyperlipidemia Mother   . Hyperlipidemia Father   . Heart disease      parents/maternal grandparents/ 2 brothers  . Stroke Paternal Grandmother   . Hypertension Father     paternal grandmother/3 brothers/1 sister  . Mental retardation Sister   . Diabetes Mother     paternal grandparents/1 brother    No Known Allergies  Objective: Temp: 97.8 F (36.6 C) (06/10 1020) Temp src: Oral (06/10 1020) BP: 111/62 mmHg (06/10 1020) Pulse Rate: 69 (06/10 1020)  General: He is in good spirits. His granddaughter is with him. His central adiposity and peripheral lipoatrophy are unchanged Oral: No oropharyngeal lesions Skin: No rash Lymph nodes: He has enlarged bilateral inguinal lymph nodes that are unchanged. Lungs: Clear Cor: Regular S1 and S2 no murmurs Abdomen: Nontender Joints and extremities: Normal Neuro: Alert and fully oriented Mood and affect normal  Lab Results HIV 1 RNA Quant (copies/mL)  Date Value  09/29/2012 <20   06/22/2012 <20   04/17/2011 <20      HIV-1 RNA Viral Load (no units)  Date Value  11/26/2012 <40   10/28/2012 <40   12/17/2011 <40      CD4 T Cell Abs (cmm)  Date Value  06/22/2012 430   04/17/2011 610   07/02/2010 490   CD4 11/26/2012: 530   Assessment: His HIV infection remains under excellent control. I offered the option of changing to a simplified antiretroviral regimen with some of her newer agents but he prefers to stay on the tried-and-true regimen of Combivir and Viramune.  Plan: 1. Continue current antiretroviral regimen 2. Follow up after lab work in 6 months   Cliffton Asters, MD [facility] for Infectious Disease [facility] [phone] pager   [phone] cell 01/11/2013, 10:39 AM

## 2013-01-12 DIAGNOSIS — L259 Unspecified contact dermatitis, unspecified cause: Secondary | ICD-10-CM | POA: Diagnosis not present

## 2013-01-13 ENCOUNTER — Encounter: Payer: Self-pay | Admitting: Internal Medicine

## 2013-01-13 LAB — HIV-1 RNA QUANT-NO REFLEX-BLD: HIV-1 RNA Viral Load: <40

## 2013-01-13 LAB — CD4/CD8 (T-HELPER/T-SUPPRESSOR CELL)
CD4: 537
CD8 % Suppressor T Cell: 29.7

## 2013-01-17 NOTE — Addendum Note (Signed)
Addended by: Jennet Maduro D on: 01/17/2013 08:40 AM   Modules accepted: Orders

## 2013-01-18 ENCOUNTER — Ambulatory Visit (INDEPENDENT_AMBULATORY_CARE_PROVIDER_SITE_OTHER): Payer: Medicare Other | Admitting: *Deleted

## 2013-01-18 VITALS — BP 134/77 | HR 60 | Temp 97.4°F | Resp 16 | Wt 124.2 lb

## 2013-01-18 DIAGNOSIS — Z21 Asymptomatic human immunodeficiency virus [HIV] infection status: Secondary | ICD-10-CM

## 2013-01-18 DIAGNOSIS — B20 Human immunodeficiency virus [HIV] disease: Secondary | ICD-10-CM

## 2013-01-18 DIAGNOSIS — E785 Hyperlipidemia, unspecified: Secondary | ICD-10-CM

## 2013-01-18 LAB — COMPREHENSIVE METABOLIC PANEL
ALT: 40 U/L (ref 0–53)
BUN: 14 mg/dL (ref 6–23)
CO2: 25 mEq/L (ref 19–32)
Calcium: 8.6 mg/dL (ref 8.4–10.5)
Chloride: 101 mEq/L (ref 96–112)
Creat: 0.83 mg/dL (ref 0.50–1.35)
Glucose, Bld: 123 mg/dL — ABNORMAL HIGH (ref 70–99)
Total Bilirubin: 0.5 mg/dL (ref 0.3–1.2)

## 2013-01-18 LAB — LIPID PANEL
LDL Cholesterol: 69 mg/dL (ref 0–99)
VLDL: 28 mg/dL (ref 0–40)

## 2013-01-18 NOTE — Progress Notes (Signed)
[name] is here for his 12 week study visit. He denies any new complaints, but he still has enlarged inguinal lymph nodes that are unchanged. Tomorrow he will go to [facility] for his [facility] procedure. If the labs drawn today are unchanged, I will have him increase his methotrexate dose to 3 tabs weekly starting on Friday. He will return for his next research visit at the end of July.

## 2013-01-25 ENCOUNTER — Encounter: Payer: Self-pay | Admitting: Family Medicine

## 2013-01-25 ENCOUNTER — Other Ambulatory Visit: Payer: Self-pay | Admitting: Internal Medicine

## 2013-01-27 ENCOUNTER — Telehealth: Payer: Self-pay | Admitting: Family Medicine

## 2013-01-27 MED ORDER — AMLODIPINE BESYLATE 2.5 MG PO TABS: 2.5000 mg | ORAL_TABLET | Freq: Every day | ORAL | Status: DC

## 2013-01-27 MED ORDER — ATORVASTATIN CALCIUM 20 MG PO TABS: 20.0000 mg | ORAL_TABLET | Freq: Every day | ORAL | Status: DC

## 2013-01-27 MED ORDER — METFORMIN HCL 500 MG PO TABS: 500.0000 mg | ORAL_TABLET | Freq: Two times a day (BID) | ORAL | Status: DC

## 2013-01-27 MED ORDER — ATENOLOL 50 MG PO TABS: 50.0000 mg | ORAL_TABLET | Freq: Every day | ORAL | Status: DC

## 2013-01-27 NOTE — Telephone Encounter (Signed)
Patient states that [facility] pharmacy in [facility] told him that they never received refills. Please resend refills. Thanks.

## 2013-01-31 ENCOUNTER — Encounter: Payer: Self-pay | Admitting: Internal Medicine

## 2013-02-10 NOTE — Addendum Note (Signed)
Addended by: Jennet Maduro D on: 02/10/2013 03:42 PM   Modules accepted: Orders

## 2013-02-16 ENCOUNTER — Encounter: Payer: Self-pay | Admitting: Internal Medicine

## 2013-02-16 LAB — CD4/CD8 (T-HELPER/T-SUPPRESSOR CELL)
CD4: 522
CD8 % Suppressor T Cell: 27.5

## 2013-02-16 LAB — HIV-1 RNA QUANT-NO REFLEX-BLD: HIV-1 RNA Viral Load: <40

## 2013-02-23 ENCOUNTER — Telehealth: Payer: Self-pay | Admitting: *Deleted

## 2013-02-23 ENCOUNTER — Other Ambulatory Visit: Payer: Self-pay | Admitting: *Deleted

## 2013-02-23 DIAGNOSIS — E119 Type 2 diabetes mellitus without complications: Secondary | ICD-10-CM

## 2013-02-23 DIAGNOSIS — I10 Essential (primary) hypertension: Secondary | ICD-10-CM | POA: Diagnosis not present

## 2013-02-23 DIAGNOSIS — B029 Zoster without complications: Secondary | ICD-10-CM

## 2013-02-23 DIAGNOSIS — E785 Hyperlipidemia, unspecified: Secondary | ICD-10-CM

## 2013-02-23 DIAGNOSIS — B20 Human immunodeficiency virus [HIV] disease: Secondary | ICD-10-CM

## 2013-02-23 MED ORDER — LAMIVUDINE-ZIDOVUDINE 150-300 MG PO TABS: ORAL_TABLET | ORAL | Status: DC

## 2013-02-23 MED ORDER — VALACYCLOVIR HCL 500 MG PO TABS: ORAL_TABLET | ORAL | Status: DC

## 2013-02-23 MED ORDER — NEVIRAPINE 200 MG PO TABS: 200.0000 mg | ORAL_TABLET | Freq: Two times a day (BID) | ORAL | Status: DC

## 2013-02-23 NOTE — Telephone Encounter (Signed)
Pt presented to the lab and orders entered per 11/2012 office note as below:      Labs prior lipid, renal, cbc, tsh, hepatic, hgba1c

## 2013-02-24 LAB — RENAL FUNCTION PANEL
BUN: 13 mg/dL (ref 6–23)
CO2: 26 mEq/L (ref 19–32)
Chloride: 104 mEq/L (ref 96–112)
Creat: 0.8 mg/dL (ref 0.50–1.35)
Glucose, Bld: 117 mg/dL — ABNORMAL HIGH (ref 70–99)

## 2013-02-24 LAB — LIPID PANEL
Cholesterol: 118 mg/dL (ref 0–200)
Triglycerides: 177 mg/dL — ABNORMAL HIGH (ref <150)
VLDL: 35 mg/dL (ref 0–40)

## 2013-02-24 LAB — CBC
HCT: 36.5 % — ABNORMAL LOW (ref 39.0–52.0)
MCHC: 35.1 g/dL (ref 30.0–36.0)
RDW: 14.4 % (ref 11.5–15.5)

## 2013-02-24 LAB — HEPATIC FUNCTION PANEL
ALT: 37 U/L (ref 0–53)
Bilirubin, Direct: 0.1 mg/dL (ref 0.0–0.3)
Total Protein: 6.2 g/dL (ref 6.0–8.3)

## 2013-03-01 ENCOUNTER — Encounter: Payer: Self-pay | Admitting: Internal Medicine

## 2013-03-02 DIAGNOSIS — R351 Nocturia: Secondary | ICD-10-CM | POA: Diagnosis not present

## 2013-03-02 DIAGNOSIS — D539 Nutritional anemia, unspecified: Secondary | ICD-10-CM | POA: Diagnosis not present

## 2013-03-03 ENCOUNTER — Ambulatory Visit (INDEPENDENT_AMBULATORY_CARE_PROVIDER_SITE_OTHER): Payer: Medicare Other | Admitting: Family Medicine

## 2013-03-03 ENCOUNTER — Encounter: Payer: Self-pay | Admitting: Family Medicine

## 2013-03-03 ENCOUNTER — Ambulatory Visit: Payer: Medicare Other | Admitting: *Deleted

## 2013-03-03 VITALS — BP 118/84 | HR 59 | Temp 97.8°F | Ht 63.0 in | Wt 120.0 lb

## 2013-03-03 VITALS — BP 122/70 | HR 58 | Temp 97.6°F | Wt 121.2 lb

## 2013-03-03 DIAGNOSIS — R351 Nocturia: Secondary | ICD-10-CM

## 2013-03-03 DIAGNOSIS — D539 Nutritional anemia, unspecified: Secondary | ICD-10-CM

## 2013-03-03 DIAGNOSIS — I10 Essential (primary) hypertension: Secondary | ICD-10-CM

## 2013-03-03 DIAGNOSIS — E785 Hyperlipidemia, unspecified: Secondary | ICD-10-CM

## 2013-03-03 DIAGNOSIS — Z21 Asymptomatic human immunodeficiency virus [HIV] infection status: Secondary | ICD-10-CM

## 2013-03-03 DIAGNOSIS — Z79899 Other long term (current) drug therapy: Secondary | ICD-10-CM | POA: Diagnosis not present

## 2013-03-03 DIAGNOSIS — E119 Type 2 diabetes mellitus without complications: Secondary | ICD-10-CM

## 2013-03-03 LAB — COMPREHENSIVE METABOLIC PANEL
ALT: 37 U/L (ref 0–53)
AST: 30 U/L (ref 0–37)
Alkaline Phosphatase: 53 U/L (ref 39–117)
BUN: 17 mg/dL (ref 6–23)
Calcium: 8.9 mg/dL (ref 8.4–10.5)
Creat: 0.79 mg/dL (ref 0.50–1.35)
Total Bilirubin: 0.5 mg/dL (ref 0.3–1.2)

## 2013-03-03 LAB — CBC WITH DIFFERENTIAL/PLATELET
Basophils Absolute: 0 10*3/uL (ref 0.0–0.1)
Basophils Relative: 1 % (ref 0–1)
Eosinophils Relative: 3 % (ref 0–5)
HCT: 35.2 % — ABNORMAL LOW (ref 39.0–52.0)
MCHC: 36.6 g/dL — ABNORMAL HIGH (ref 30.0–36.0)
MCV: 114.7 fL — ABNORMAL HIGH (ref 78.0–100.0)
Monocytes Absolute: 0.4 10*3/uL (ref 0.1–1.0)
Platelets: 185 10*3/uL (ref 150–400)
RDW: 13.4 % (ref 11.5–15.5)
WBC: 3.6 10*3/uL — ABNORMAL LOW (ref 4.0–10.5)

## 2013-03-03 LAB — VITAMIN B12: Vitamin B-12: 224 pg/mL (ref 211–911)

## 2013-03-03 NOTE — Progress Notes (Signed)
Pt here for A5314, week 18. He denies any new problems, signs, or symptoms. He is adherent with all his medications. Fasting labs were drawn; vital signs are stable. Study medication were dispensed. He received $50 gift card for his visit. Next appt scheduled for Wed., April 13, 2013 @ 8:30am. Tacey Heap RN

## 2013-03-03 NOTE — Patient Instructions (Addendum)
Labs prior lipid, renal cbc, tsh hepatic

## 2013-03-04 ENCOUNTER — Encounter: Payer: Self-pay | Admitting: Family Medicine

## 2013-03-04 LAB — URINALYSIS
Bilirubin Urine: NEGATIVE
Glucose, UA: NEGATIVE mg/dL
Leukocytes, UA: NEGATIVE
Protein, ur: NEGATIVE mg/dL
Specific Gravity, Urine: 1.013 (ref 1.005–1.030)
pH: 5.5 (ref 5.0–8.0)

## 2013-03-04 NOTE — Progress Notes (Signed)
Patient ID: [name], male   DOB: 1946-06-27, 68 y.o.   MRN: [id number] [name] [id number] November 22, 1945 03/04/2013      Progress Note-Follow Up  Subjective  Chief Complaint  Chief Complaint  Patient presents with  . Follow-up    3 month    HPI  Patient is a 67 year old male in today for followup. Generally doing well but does offer a complaint of persistent nocturia. He says it's been going on for roughly a year. He gets up roughly 3 times a night but denies dysuria or abdominal pain. No back pain or fevers. No nausea or GI changes. He reports otherwise his sugars are well controlled. No polyuria or polydipsia. No chest pain, palpitations, shortness of breath, fevers, headaches.  Past Medical History  Diagnosis Date  . Arthritis   . Diabetes mellitus   . History of depression   . GERD (gastroesophageal reflux disease)   . CAD (coronary artery disease)     2003- cabg  . Hypertension   . Hyperlipidemia   . History of kidney stones   . COPD (chronic obstructive pulmonary disease)   . HIV (human immunodeficiency virus infection)   . Dermatitis 11/27/2012    Past Surgical History  Procedure Laterality Date  . Coronary artery bypass graft  05/2002  . Vasectomy      Family History  Problem Relation Age of Onset  . Arthritis      mother/father/paternal grandparents  . Breast cancer Maternal Aunt     paternal aunt  . Lung cancer Maternal Aunt   . Hyperlipidemia Mother   . Hyperlipidemia Father   . Heart disease      parents/maternal grandparents/ 2 brothers  . Stroke Paternal Grandmother   . Hypertension Father     paternal grandmother/3 brothers/1 sister  . Mental retardation Sister   . Diabetes Mother     paternal grandparents/1 brother    History   Social History  . Marital Status: Divorced    Spouse Name: N/A    Number of Children: 4  . Years of Education: N/A   Occupational History  . Retired     Disability   Social History Main Topics  .  Smoking status: Never Smoker   . Smokeless tobacco: Never Used  . Alcohol Use: No  . Drug Use: No  . Sexually Active: Not Currently     Comment: declined condoms   Other Topics Concern  . Not on file   Social History Narrative  . No narrative on file    Current Outpatient Prescriptions on File Prior to Visit  Medication Sig Dispense Refill  . amLODipine (NORVASC) 2.5 MG tablet Take 1 tablet (2.5 mg total) by mouth daily.  90 tablet  0  . aspirin (ASPIRIN CHILDRENS) 81 MG chewable tablet Chew 1 tablet (81 mg total) by mouth daily.  30 tablet  11  . atenolol (TENORMIN) 50 MG tablet Take 1 tablet (50 mg total) by mouth daily.  90 tablet  0  . atorvastatin (LIPITOR) 20 MG tablet Take 1 tablet (20 mg total) by mouth daily.  90 tablet  0  . fenofibrate 160 MG tablet Take 1 tablet (160 mg total) by mouth daily.  30 tablet  11  . folic acid (FOLVITE) 1 MG tablet Take 1 mg by mouth daily.      Marland Kitchen Glucerna (GLUCERNA) LIQD Take 1 Can by mouth 2 (two) times daily between meals.  48 Can  5  . glucose blood (  FREESTYLE TEST STRIPS) test strip Check blood sugar once a day  DX 250.00  100 each  1  . lamiVUDine-zidovudine (COMBIVIR) 150-300 MG per tablet TAKE 1 TABLET BY MOUTH TWICE DAILY  60 tablet  5  . Lancets (FREESTYLE) lancets USE AS DIRECTED TO CHECK BLOOD SUGAR ONCE A DAY  100 each  1  . lisinopril-hydrochlorothiazide (ZESTORETIC) 20-12.5 MG per tablet Take 2 tablets by mouth daily.  60 tablet  6  . metFORMIN (GLUCOPHAGE) 500 MG tablet Take 1 tablet (500 mg total) by mouth 2 (two) times daily with a meal.  180 tablet  0  . methotrexate (RHEUMATREX) 2.5 MG tablet Take 2.5 mg by mouth once a week. Caution:Chemotherapy. Protect from light.      . nevirapine (VIRAMUNE) 200 MG tablet Take 1 tablet (200 mg total) by mouth 2 (two) times daily.  60 tablet  5  . omeprazole (PRILOSEC) 20 MG capsule Take 1 capsule (20 mg total) by mouth daily.  30 capsule  11  . triamcinolone cream (KENALOG) 0.1 % Apply  topically at bedtime as needed.  45 g  1  . valACYclovir (VALTREX) 500 MG tablet TAKE 1 TABLET BY MOUTH TWICE DAILY  60 tablet  5   No current facility-administered medications on file prior to visit.    No Known Allergies  Review of Systems  Review of Systems  Constitutional: Negative for fever and malaise/fatigue.  HENT: Negative for congestion.   Eyes: Negative for pain and discharge.  Respiratory: Negative for shortness of breath.   Cardiovascular: Negative for chest pain, palpitations and leg swelling.  Gastrointestinal: Negative for nausea, abdominal pain and diarrhea.  Genitourinary: Positive for urgency and frequency. Negative for dysuria.  Musculoskeletal: Negative for falls.  Skin: Negative for rash.  Neurological: Negative for loss of consciousness and headaches.  Endo/Heme/Allergies: Negative for polydipsia.  Psychiatric/Behavioral: Negative for depression and suicidal ideas. The patient is not nervous/anxious and does not have insomnia.     Objective  BP 118/84  Pulse 59  Temp(Src) 97.8 F (36.6 C) (Oral)  Ht 5\' 3"  (1.6 m)  Wt 120 lb (54.432 kg)  BMI 21.26 kg/m2  SpO2 98%  Physical Exam  Physical Exam  Constitutional: He is oriented to person, place, and time and well-developed, well-nourished, and in no distress. No distress.  HENT:  Head: Normocephalic and atraumatic.  Eyes: Conjunctivae are normal.  Neck: Neck supple. No thyromegaly present.  Cardiovascular: Normal rate, regular rhythm and normal heart sounds.  Exam reveals no gallop.   No murmur heard. Pulmonary/Chest: Effort normal and breath sounds normal. No respiratory distress.  Abdominal: He exhibits no distension and no mass. There is no tenderness.  Musculoskeletal: He exhibits no edema.  Neurological: He is alert and oriented to person, place, and time.  Skin: Skin is warm.  Psychiatric: Memory, affect and judgment normal.    Lab Results  Component Value Date   TSH 1.972 02/23/2013    Lab Results  Component Value Date   WBC 3.6* 03/03/2013   HGB 12.9* 03/03/2013   HCT 35.2* 03/03/2013   MCV 114.7* 03/03/2013   PLT 185 03/03/2013   Lab Results  Component Value Date   CREATININE 0.79 03/03/2013   BUN 17 03/03/2013   NA 137 03/03/2013   K 4.3 03/03/2013   CL 102 03/03/2013   CO2 23 03/03/2013   Lab Results  Component Value Date   ALT 37 03/03/2013   AST 30 03/03/2013   ALKPHOS 53 03/03/2013  BILITOT 0.5 03/03/2013   Lab Results  Component Value Date   CHOL 118 02/23/2013   Lab Results  Component Value Date   HDL 28* 02/23/2013   Lab Results  Component Value Date   LDLCALC 55 02/23/2013   Lab Results  Component Value Date   TRIG 177* 02/23/2013   Lab Results  Component Value Date   CHOLHDL 4.2 02/23/2013     Assessment & Plan  Nocturia Referred to urology for further consideration  HYPERTENSION Well controlled no changes to meds today  DIABETES MELLITUS, TYPE II Tolerating Metformin, good control, no changes, avoid simple carbs  DYSLIPIDEMIA Tolerating Atorvastatin, avoid trans fats, increase activity

## 2013-03-05 LAB — URINE CULTURE
Colony Count: NO GROWTH
Organism ID, Bacteria: NO GROWTH

## 2013-03-06 ENCOUNTER — Encounter: Payer: Self-pay | Admitting: Family Medicine

## 2013-03-06 DIAGNOSIS — R351 Nocturia: Secondary | ICD-10-CM

## 2013-03-06 HISTORY — DX: Nocturia: R35.1

## 2013-03-06 NOTE — Assessment & Plan Note (Signed)
Tolerating Metformin, good control, no changes, avoid simple carbs

## 2013-03-06 NOTE — Assessment & Plan Note (Signed)
Tolerating Atorvastatin, avoid trans fats, increase activity

## 2013-03-06 NOTE — Assessment & Plan Note (Signed)
Well controlled no changes to meds today 

## 2013-03-06 NOTE — Assessment & Plan Note (Signed)
Referred to urology for further consideration 

## 2013-03-16 ENCOUNTER — Encounter: Payer: Self-pay | Admitting: Family Medicine

## 2013-03-16 ENCOUNTER — Encounter: Payer: Self-pay | Admitting: Internal Medicine

## 2013-03-17 ENCOUNTER — Other Ambulatory Visit: Payer: Self-pay | Admitting: Licensed Clinical Social Worker

## 2013-03-17 ENCOUNTER — Ambulatory Visit: Payer: Self-pay

## 2013-03-17 DIAGNOSIS — B20 Human immunodeficiency virus [HIV] disease: Secondary | ICD-10-CM

## 2013-03-17 MED ORDER — LAMIVUDINE-ZIDOVUDINE 150-300 MG PO TABS: ORAL_TABLET | ORAL | Status: DC

## 2013-03-18 ENCOUNTER — Other Ambulatory Visit: Payer: Self-pay | Admitting: Internal Medicine

## 2013-03-21 ENCOUNTER — Encounter: Payer: Self-pay | Admitting: Family Medicine

## 2013-03-22 MED ORDER — LISINOPRIL-HYDROCHLOROTHIAZIDE 20-12.5 MG PO TABS: 2.0000 | ORAL_TABLET | Freq: Every day | ORAL | Status: DC

## 2013-04-13 ENCOUNTER — Ambulatory Visit: Payer: Medicare Other | Admitting: *Deleted

## 2013-04-13 ENCOUNTER — Other Ambulatory Visit: Payer: Medicare Other

## 2013-04-13 VITALS — BP 139/76 | HR 60 | Temp 98.1°F | Resp 16 | Wt 123.5 lb

## 2013-04-13 DIAGNOSIS — E785 Hyperlipidemia, unspecified: Secondary | ICD-10-CM

## 2013-04-13 DIAGNOSIS — Z113 Encounter for screening for infections with a predominantly sexual mode of transmission: Secondary | ICD-10-CM

## 2013-04-13 DIAGNOSIS — Z21 Asymptomatic human immunodeficiency virus [HIV] infection status: Secondary | ICD-10-CM | POA: Diagnosis not present

## 2013-04-13 LAB — COMPREHENSIVE METABOLIC PANEL
AST: 29 U/L (ref 0–37)
Alkaline Phosphatase: 56 U/L (ref 39–117)
BUN: 10 mg/dL (ref 6–23)
Creat: 0.76 mg/dL (ref 0.50–1.35)
Potassium: 4.4 mEq/L (ref 3.5–5.3)

## 2013-04-13 LAB — LIPID PANEL
HDL: 28 mg/dL — ABNORMAL LOW (ref >39)
LDL Cholesterol: 34 mg/dL (ref 0–99)
Total CHOL/HDL Ratio: 3.8 Ratio

## 2013-04-13 LAB — RPR

## 2013-04-13 NOTE — Progress Notes (Signed)
[name] is here for A5314, week 24. Assessment is unchanged since last study visit. No changes in medication. Last intake of MTX was on Friday, 04/08/13. Fasting labs were drawn with no problems. He returned his pill bottles with no pills. He did not miss any pills. Questionnaires were completed and he returned BART CRF's. He received $50 gift card for study visit. Next appointment scheduled for Thursday, July 14, 2013 @ 8:30am. Tacey Heap RN

## 2013-04-19 ENCOUNTER — Encounter: Payer: Self-pay | Admitting: Internal Medicine

## 2013-04-20 ENCOUNTER — Other Ambulatory Visit: Payer: Self-pay | Admitting: Licensed Clinical Social Worker

## 2013-04-20 ENCOUNTER — Other Ambulatory Visit: Payer: Self-pay | Admitting: *Deleted

## 2013-04-20 DIAGNOSIS — E119 Type 2 diabetes mellitus without complications: Secondary | ICD-10-CM

## 2013-04-20 MED ORDER — GLUCERNA PO LIQD: 1.0000 | Freq: Two times a day (BID) | ORAL | Status: DC

## 2013-05-13 ENCOUNTER — Encounter: Payer: Self-pay | Admitting: Internal Medicine

## 2013-05-22 ENCOUNTER — Encounter: Payer: Self-pay | Admitting: Family Medicine

## 2013-05-23 MED ORDER — ATENOLOL 50 MG PO TABS: 50.0000 mg | ORAL_TABLET | Freq: Every day | ORAL | Status: DC

## 2013-05-23 MED ORDER — METFORMIN HCL 500 MG PO TABS: 500.0000 mg | ORAL_TABLET | Freq: Two times a day (BID) | ORAL | Status: DC

## 2013-05-23 MED ORDER — ATORVASTATIN CALCIUM 20 MG PO TABS: 20.0000 mg | ORAL_TABLET | Freq: Every day | ORAL | Status: DC

## 2013-05-23 MED ORDER — AMLODIPINE BESYLATE 2.5 MG PO TABS: 2.5000 mg | ORAL_TABLET | Freq: Every day | ORAL | Status: DC

## 2013-05-24 ENCOUNTER — Telehealth: Payer: Self-pay

## 2013-05-24 DIAGNOSIS — E119 Type 2 diabetes mellitus without complications: Secondary | ICD-10-CM

## 2013-05-24 DIAGNOSIS — I10 Essential (primary) hypertension: Secondary | ICD-10-CM | POA: Diagnosis not present

## 2013-05-24 DIAGNOSIS — B20 Human immunodeficiency virus [HIV] disease: Secondary | ICD-10-CM | POA: Diagnosis not present

## 2013-05-24 DIAGNOSIS — E785 Hyperlipidemia, unspecified: Secondary | ICD-10-CM | POA: Diagnosis not present

## 2013-05-24 LAB — LIPID PANEL
HDL: 26 mg/dL — ABNORMAL LOW (ref >39)
LDL Cholesterol: 35 mg/dL (ref 0–99)
Total CHOL/HDL Ratio: 4.5 Ratio
Triglycerides: 285 mg/dL — ABNORMAL HIGH (ref <150)

## 2013-05-24 LAB — HEPATIC FUNCTION PANEL
Alkaline Phosphatase: 46 U/L (ref 39–117)
Bilirubin, Direct: 0.1 mg/dL (ref 0.0–0.3)
Indirect Bilirubin: 0.3 mg/dL (ref 0.0–0.9)
Total Bilirubin: 0.4 mg/dL (ref 0.3–1.2)
Total Protein: 6.3 g/dL (ref 6.0–8.3)

## 2013-05-24 LAB — CBC
Hemoglobin: 13 g/dL (ref 13.0–17.0)
MCH: 41.1 pg — ABNORMAL HIGH (ref 26.0–34.0)
MCV: 113 fL — ABNORMAL HIGH (ref 78.0–100.0)
Platelets: 183 10*3/uL (ref 150–400)
RBC: 3.16 MIL/uL — ABNORMAL LOW (ref 4.22–5.81)
WBC: 3.8 10*3/uL — ABNORMAL LOW (ref 4.0–10.5)

## 2013-05-24 LAB — RENAL FUNCTION PANEL
Albumin: 4 g/dL (ref 3.5–5.2)
Calcium: 8.6 mg/dL (ref 8.4–10.5)
Glucose, Bld: 137 mg/dL — ABNORMAL HIGH (ref 70–99)
Phosphorus: 2.6 mg/dL (ref 2.3–4.6)
Potassium: 4.3 mEq/L (ref 3.5–5.3)
Sodium: 138 mEq/L (ref 135–145)

## 2013-05-24 LAB — TSH: TSH: 2.092 u[IU]/mL (ref 0.350–4.500)

## 2013-05-24 NOTE — Telephone Encounter (Signed)
Lab order placed.

## 2013-06-02 ENCOUNTER — Telehealth: Payer: Self-pay | Admitting: Family Medicine

## 2013-06-02 ENCOUNTER — Encounter: Payer: Self-pay | Admitting: Family Medicine

## 2013-06-02 ENCOUNTER — Ambulatory Visit (INDEPENDENT_AMBULATORY_CARE_PROVIDER_SITE_OTHER): Payer: Medicare Other | Admitting: Family Medicine

## 2013-06-02 VITALS — BP 110/70 | HR 58 | Temp 97.9°F | Ht 63.0 in | Wt 124.0 lb

## 2013-06-02 DIAGNOSIS — Z23 Encounter for immunization: Secondary | ICD-10-CM

## 2013-06-02 DIAGNOSIS — I2510 Atherosclerotic heart disease of native coronary artery without angina pectoris: Secondary | ICD-10-CM

## 2013-06-02 DIAGNOSIS — E785 Hyperlipidemia, unspecified: Secondary | ICD-10-CM

## 2013-06-02 DIAGNOSIS — I10 Essential (primary) hypertension: Secondary | ICD-10-CM

## 2013-06-02 DIAGNOSIS — E119 Type 2 diabetes mellitus without complications: Secondary | ICD-10-CM

## 2013-06-02 DIAGNOSIS — Z87442 Personal history of urinary calculi: Secondary | ICD-10-CM

## 2013-06-02 DIAGNOSIS — IMO0002 Reserved for concepts with insufficient information to code with codable children: Secondary | ICD-10-CM

## 2013-06-02 DIAGNOSIS — M7710 Lateral epicondylitis, unspecified elbow: Secondary | ICD-10-CM

## 2013-06-02 NOTE — Telephone Encounter (Signed)
Labs prior to visit lipid, renal, cbc, hepatic, tsh, hgba1c   Patient has appointment 09/05/13 and will be going to [facility] lab

## 2013-06-02 NOTE — Patient Instructions (Signed)
Salon Pas or aspercreme twice a day   Lateral Epicondylitis (Tennis Elbow) with Rehab Lateral epicondylitis involves inflammation and pain around the outer portion of the elbow. The pain is caused by inflammation of the tendons in the forearm that bring back (extend) the wrist. Lateral epicondylittis is also called tennis elbow, because it is very common in tennis players. However, it may occur in any individual who extends the wrist repetitively. If lateral epicondylitis is left untreated, it may become a chronic problem. SYMPTOMS   Pain, tenderness, and inflammation on the outer (lateral) side of the elbow.  Pain or weakness with gripping activities.  Pain that increases with wrist twisting motions (playing tennis, using a screwdriver, opening a door or a jar).  Pain with lifting objects, including a coffee cup. CAUSES  Lateral epicondylitis is caused by inflammation of the tendons that extend the wrist. Causes of injury may include:  Repetitive stress and strain on the muscles and tendons that extend the wrist.  Sudden change in activity level or intensity.  Incorrect grip in racquet sports.  Incorrect grip size of racquet (often too large).  Incorrect hitting position or technique (usually backhand, leading with the elbow).  Using a racket that is too heavy. RISK INCREASES WITH:  Sports or occupations that require repetitive and/or strenuous forearm and wrist movements (tennis, squash, racquetball, carpentry).  Poor wrist and forearm strength and flexibility.  Failure to warm up properly before activity.  Resuming activity before healing, rehabilitation, and conditioning are complete. PREVENTION   Warm up and stretch properly before activity.  Maintain physical fitness:  Strength, flexibility, and endurance.  Cardiovascular fitness.  Wear and use properly fitted equipment.  Learn and use proper technique and have a coach correct improper technique.  Wear a  tennis elbow (counterforce) brace. PROGNOSIS  The course of this condition depends on the degree of the injury. If treated properly, acute cases (symptoms lasting less than 4 weeks) are often resolved in 2 to 6 weeks. Chronic (longer lasting cases) often resolve in 3 to 6 months, but may require physical therapy. RELATED COMPLICATIONS   Frequently recurring symptoms, resulting in a chronic problem. Properly treating the problem the first time decreases frequency of recurrence.  Chronic inflammation, scarring tendon degeneration, and partial tendon tear, requiring surgery.  Delayed healing or resolution of symptoms. TREATMENT  Treatment first involves the use of ice and medicine, to reduce pain and inflammation. Strengthening and stretching exercises may help reduce discomfort, if performed regularly. These exercises may be performed at home, if the condition is an acute injury. Chronic cases may require a referral to a physical therapist for evaluation and treatment. Your caregiver may advise a corticosteroid injection, to help reduce inflammation. Rarely, surgery is needed. MEDICATION  If pain medicine is needed, nonsteroidal anti-inflammatory medicines (aspirin and ibuprofen), or other minor pain relievers (acetaminophen), are often advised.  Do not take pain medicine for 7 days before surgery.  Prescription pain relievers may be given, if your caregiver thinks they are needed. Use only as directed and only as much as you need.  Corticosteroid injections may be recommended. These injections should be reserved only for the most severe cases, because they can only be given a certain number of times. HEAT AND COLD  Cold treatment (icing) should be applied for 10 to 15 minutes every 2 to 3 hours for inflammation and pain, and immediately after activity that aggravates your symptoms. Use ice packs or an ice massage.  Heat treatment may be used  before performing stretching and strengthening  activities prescribed by your caregiver, physical therapist, or athletic trainer. Use a heat pack or a warm water soak. SEEK MEDICAL CARE IF: Symptoms get worse or do not improve in 2 weeks, despite treatment. EXERCISES  RANGE OF MOTION (ROM) AND STRETCHING EXERCISES - Epicondylitis, Lateral (Tennis Elbow) These exercises may help you when beginning to rehabilitate your injury. Your symptoms may go away with or without further involvement from your physician, physical therapist or athletic trainer. While completing these exercises, remember:   Restoring tissue flexibility helps normal motion to return to the joints. This allows healthier, less painful movement and activity.  An effective stretch should be held for at least 30 seconds.  A stretch should never be painful. You should only feel a gentle lengthening or release in the stretched tissue. RANGE OF MOTION  Wrist Flexion, Active-Assisted  Extend your right / left elbow with your fingers pointing down.*  Gently pull the back of your hand towards you, until you feel a gentle stretch on the top of your forearm.  Hold this position for __________ seconds. Repeat __________ times. Complete this exercise __________ times per day.  *If directed by your physician, physical therapist or athletic trainer, complete this stretch with your elbow bent, rather than extended. RANGE OF MOTION  Wrist Extension, Active-Assisted  Extend your right / left elbow and turn your palm upwards.*  Gently pull your palm and fingertips back, so your wrist extends and your fingers point more toward the ground.  You should feel a gentle stretch on the inside of your forearm.  Hold this position for __________ seconds. Repeat __________ times. Complete this exercise __________ times per day. *If directed by your physician, physical therapist or athletic trainer, complete this stretch with your elbow bent, rather than extended. STRETCH - Wrist Flexion  Place  the back of your right / left hand on a tabletop, leaving your elbow slightly bent. Your fingers should point away from your body.  Gently press the back of your hand down onto the table by straightening your elbow. You should feel a stretch on the top of your forearm.  Hold this position for __________ seconds. Repeat __________ times. Complete this stretch __________ times per day.  STRETCH  Wrist Extension   Place your right / left fingertips on a tabletop, leaving your elbow slightly bent. Your fingers should point backwards.  Gently press your fingers and palm down onto the table by straightening your elbow. You should feel a stretch on the inside of your forearm.  Hold this position for __________ seconds. Repeat __________ times. Complete this stretch __________ times per day.  STRENGTHENING EXERCISES - Epicondylitis, Lateral (Tennis Elbow) These exercises may help you when beginning to rehabilitate your injury. They may resolve your symptoms with or without further involvement from your physician, physical therapist or athletic trainer. While completing these exercises, remember:   Muscles can gain both the endurance and the strength needed for everyday activities through controlled exercises.  Complete these exercises as instructed by your physician, physical therapist or athletic trainer. Increase the resistance and repetitions only as guided.  You may experience muscle soreness or fatigue, but the pain or discomfort you are trying to eliminate should never worsen during these exercises. If this pain does get worse, stop and make sure you are following the directions exactly. If the pain is still present after adjustments, discontinue the exercise until you can discuss the trouble with your caregiver. STRENGTH Wrist Flexors  Sit with your right / left forearm palm-up and fully supported on a table or countertop. Your elbow should be resting below the height of your shoulder. Allow  your wrist to extend over the edge of the surface.  Loosely holding a __________ weight, or a piece of rubber exercise band or tubing, slowly curl your hand up toward your forearm.  Hold this position for __________ seconds. Slowly lower the wrist back to the starting position in a controlled manner. Repeat __________ times. Complete this exercise __________ times per day.  STRENGTH  Wrist Extensors  Sit with your right / left forearm palm-down and fully supported on a table or countertop. Your elbow should be resting below the height of your shoulder. Allow your wrist to extend over the edge of the surface.  Loosely holding a __________ weight, or a piece of rubber exercise band or tubing, slowly curl your hand up toward your forearm.  Hold this position for __________ seconds. Slowly lower the wrist back to the starting position in a controlled manner. Repeat __________ times. Complete this exercise __________ times per day.  STRENGTH - Ulnar Deviators  Stand with a ____________________ weight in your right / left hand, or sit while holding a rubber exercise band or tubing, with your healthy arm supported on a table or countertop.  Move your wrist, so that your pinkie travels toward your forearm and your thumb moves away from your forearm.  Hold this position for __________ seconds and then slowly lower the wrist back to the starting position. Repeat __________ times. Complete this exercise __________ times per day STRENGTH - Radial Deviators  Stand with a ____________________ weight in your right / left hand, or sit while holding a rubber exercise band or tubing, with your injured arm supported on a table or countertop.  Raise your hand upward in front of you or pull up on the rubber tubing.  Hold this position for __________ seconds and then slowly lower the wrist back to the starting position. Repeat __________ times. Complete this exercise __________ times per day. STRENGTH   Forearm Supinators   Sit with your right / left forearm supported on a table, keeping your elbow below shoulder height. Rest your hand over the edge, palm down.  Gently grip a hammer or a soup ladle.  Without moving your elbow, slowly turn your palm and hand upward to a "thumbs-up" position.  Hold this position for __________ seconds. Slowly return to the starting position. Repeat __________ times. Complete this exercise __________ times per day.  STRENGTH  Forearm Pronators   Sit with your right / left forearm supported on a table, keeping your elbow below shoulder height. Rest your hand over the edge, palm up.  Gently grip a hammer or a soup ladle.  Without moving your elbow, slowly turn your palm and hand upward to a "thumbs-up" position.  Hold this position for __________ seconds. Slowly return to the starting position. Repeat __________ times. Complete this exercise __________ times per day.  STRENGTH - Grip  Grasp a tennis ball, a dense sponge, or a large, rolled sock in your hand.  Squeeze as hard as you can, without increasing any pain.  Hold this position for __________ seconds. Release your grip slowly. Repeat __________ times. Complete this exercise __________ times per day.  STRENGTH - Elbow Extensors, Isometric  Stand or sit upright, on a firm surface. Place your right / left arm so that your palm faces your stomach, and it is at the height  of your waist.  Place your opposite hand on the underside of your forearm. Gently push up as your right / left arm resists. Push as hard as you can with both arms, without causing any pain or movement at your right / left elbow. Hold this stationary position for __________ seconds. Gradually release the tension in both arms. Allow your muscles to relax completely before repeating. Document Released: 07/21/2005 Document Revised: 10/13/2011 Document Reviewed: 11/02/2008 [facility] Patient Information 2014 [place], [place].

## 2013-06-05 ENCOUNTER — Encounter: Payer: Self-pay | Admitting: Family Medicine

## 2013-06-05 DIAGNOSIS — IMO0002 Reserved for concepts with insufficient information to code with codable children: Secondary | ICD-10-CM

## 2013-06-05 HISTORY — DX: Reserved for concepts with insufficient information to code with codable children: IMO0002

## 2013-06-05 NOTE — Assessment & Plan Note (Signed)
Well controlled no changes to meds 

## 2013-06-05 NOTE — Assessment & Plan Note (Signed)
Risk factors controlled, asymptomatic

## 2013-06-05 NOTE — Assessment & Plan Note (Signed)
Given flu shot today, well controlled on metformin, no changes.

## 2013-06-05 NOTE — Assessment & Plan Note (Signed)
Try ice and Aspercreme if no improvement, agrees to proceed with sports med referral

## 2013-06-05 NOTE — Assessment & Plan Note (Signed)
Was referred to urology but hsa had to defer due to financial concerns.

## 2013-06-05 NOTE — Assessment & Plan Note (Signed)
Avoid trans fats, tolerating Lipitor, minimize trans fats and simple carbs. Consider adding Niacin and krill oil caps.

## 2013-06-05 NOTE — Progress Notes (Signed)
Patient ID: [name], male   DOB: Jan 17, 1946, 67 y.o.   MRN: [id number] [name] [id number] 25-Mar-1946 06/05/2013      Progress Note-Follow Up  Subjective  Chief Complaint  Chief Complaint  Patient presents with  . Follow-up    3 month  . Injections    flu- high dose    HPI  Patient is a 67 year old African American male in today for followup. Overall feels well. Is swelling in his right elbow for the last month or 2 however. Denies any trauma. Denies any warmth but it has been mildly tender and swollen despite trying to rest. No other complaints. No fevers or chills. No chest pain or palpitations. No shortness of breath GI or GU concerns noted  Past Medical History  Diagnosis Date  . Arthritis   . Diabetes mellitus   . History of depression   . GERD (gastroesophageal reflux disease)   . CAD (coronary artery disease)     2003- cabg  . Hypertension   . Hyperlipidemia   . History of kidney stones   . COPD (chronic obstructive pulmonary disease)   . HIV (human immunodeficiency virus infection)   . Dermatitis 11/27/2012  . Nocturia 03/06/2013  . Epicondylitis 06/05/2013    right    Past Surgical History  Procedure Laterality Date  . Coronary artery bypass graft  05/2002  . Vasectomy      Family History  Problem Relation Age of Onset  . Arthritis      mother/father/paternal grandparents  . Breast cancer Maternal Aunt     paternal aunt  . Lung cancer Maternal Aunt   . Hyperlipidemia Mother   . Hyperlipidemia Father   . Heart disease      parents/maternal grandparents/ 2 brothers  . Stroke Paternal Grandmother   . Hypertension Father     paternal grandmother/3 brothers/1 sister  . Mental retardation Sister   . Diabetes Mother     paternal grandparents/1 brother    History   Social History  . Marital Status: Divorced    Spouse Name: N/A    Number of Children: 4  . Years of Education: N/A   Occupational History  . Retired     Disability   Social  History Main Topics  . Smoking status: Never Smoker   . Smokeless tobacco: Never Used  . Alcohol Use: No  . Drug Use: No  . Sexual Activity: Not Currently     Comment: declined condoms   Other Topics Concern  . Not on file   Social History Narrative  . No narrative on file    Current Outpatient Prescriptions on File Prior to Visit  Medication Sig Dispense Refill  . amLODipine (NORVASC) 2.5 MG tablet Take 1 tablet (2.5 mg total) by mouth daily.  90 tablet  1  . aspirin (ASPIRIN CHILDRENS) 81 MG chewable tablet Chew 1 tablet (81 mg total) by mouth daily.  30 tablet  11  . atenolol (TENORMIN) 50 MG tablet Take 1 tablet (50 mg total) by mouth daily.  90 tablet  1  . atorvastatin (LIPITOR) 20 MG tablet Take 1 tablet (20 mg total) by mouth daily.  90 tablet  1  . fenofibrate 160 MG tablet Take 1 tablet (160 mg total) by mouth daily.  30 tablet  11  . folic acid (FOLVITE) 1 MG tablet Take 1 mg by mouth daily.      Marland Kitchen GLUCERNA (GLUCERNA) LIQD Take 1 Can by mouth 2 (two) times  daily between meals.  48 Can  5  . glucose blood (FREESTYLE TEST STRIPS) test strip Check blood sugar once a day  DX 250.00  100 each  1  . lamiVUDine-zidovudine (COMBIVIR) 150-300 MG per tablet TAKE 1 TABLET BY MOUTH TWICE DAILY  60 tablet  5  . Lancets (FREESTYLE) lancets USE AS DIRECTED TO CHECK BLOOD SUGAR ONCE A DAY  100 each  1  . lisinopril-hydrochlorothiazide (PRINZIDE,ZESTORETIC) 20-12.5 MG per tablet Take 2 tablets by mouth daily.  60 tablet  2  . metFORMIN (GLUCOPHAGE) 500 MG tablet Take 1 tablet (500 mg total) by mouth 2 (two) times daily with a meal.  180 tablet  1  . methotrexate (RHEUMATREX) 2.5 MG tablet Take 2.5 mg by mouth once a week. Caution:Chemotherapy. Protect from light.      . nevirapine (VIRAMUNE) 200 MG tablet Take 1 tablet (200 mg total) by mouth 2 (two) times daily.  60 tablet  5  . omeprazole (PRILOSEC) 20 MG capsule Take 1 capsule (20 mg total) by mouth daily.  30 capsule  11  .  triamcinolone cream (KENALOG) 0.1 % Apply topically at bedtime as needed.  45 g  1  . valACYclovir (VALTREX) 500 MG tablet TAKE 1 TABLET BY MOUTH TWICE DAILY  60 tablet  5   No current facility-administered medications on file prior to visit.    No Known Allergies  Review of Systems  Review of Systems  Constitutional: Negative for fever and malaise/fatigue.  HENT: Negative for congestion.   Eyes: Negative for discharge.  Respiratory: Negative for shortness of breath.   Cardiovascular: Negative for chest pain, palpitations and leg swelling.  Gastrointestinal: Negative for nausea, abdominal pain and diarrhea.  Genitourinary: Positive for urgency and frequency. Negative for dysuria.  Musculoskeletal: Positive for joint pain. Negative for falls.       Right elbow swollen and mildly tender, no injury or warmth  Skin: Negative for rash.  Neurological: Negative for loss of consciousness and headaches.  Endo/Heme/Allergies: Negative for polydipsia.  Psychiatric/Behavioral: Negative for depression and suicidal ideas. The patient is not nervous/anxious and does not have insomnia.     Objective  BP 110/70  Pulse 58  Temp(Src) 97.9 F (36.6 C) (Oral)  Ht 5\' 3"  (1.6 m)  Wt 124 lb (56.246 kg)  BMI 21.97 kg/m2  SpO2 96%  Physical Exam  Physical Exam  Constitutional: He is oriented to person, place, and time and well-developed, well-nourished, and in no distress. No distress.  HENT:  Head: Normocephalic and atraumatic.  Eyes: Conjunctivae are normal.  Neck: Neck supple. No thyromegaly present.  Cardiovascular: Normal rate, regular rhythm and normal heart sounds.   No murmur heard. Pulmonary/Chest: Effort normal and breath sounds normal. No respiratory distress.  Abdominal: He exhibits no distension and no mass. There is no tenderness.  Musculoskeletal: He exhibits no edema.  Neurological: He is alert and oriented to person, place, and time.  Skin: Skin is warm.  Psychiatric:  Memory, affect and judgment normal.    Lab Results  Component Value Date   TSH 2.092 05/24/2013   Lab Results  Component Value Date   WBC 3.8* 05/24/2013   HGB 13.0 05/24/2013   HCT 35.7* 05/24/2013   MCV 113.0* 05/24/2013   PLT 183 05/24/2013   Lab Results  Component Value Date   CREATININE 0.79 05/24/2013   BUN 15 05/24/2013   NA 138 05/24/2013   K 4.3 05/24/2013   CL 102 05/24/2013   CO2 25 05/24/2013  Lab Results  Component Value Date   ALT 35 05/24/2013   AST 31 05/24/2013   ALKPHOS 46 05/24/2013   BILITOT 0.4 05/24/2013   Lab Results  Component Value Date   CHOL 118 05/24/2013   Lab Results  Component Value Date   HDL 26* 05/24/2013   Lab Results  Component Value Date   LDLCALC 35 05/24/2013   Lab Results  Component Value Date   TRIG 285* 05/24/2013   Lab Results  Component Value Date   CHOLHDL 4.5 05/24/2013     Assessment & Plan  HYPERTENSION Well controlled no changes to meds.  DIABETES MELLITUS, TYPE II Given flu shot today, well controlled on metformin, no changes.  CORONARY ARTERY DISEASE Risk factors controlled, asymptomatic  NEPHROLITHIASIS, HX OF Was referred to urology but hsa had to defer due to financial concerns.   DYSLIPIDEMIA Avoid trans fats, tolerating Lipitor, minimize trans fats and simple carbs. Consider adding Niacin and krill oil caps.   Epicondylitis Try ice and Aspercreme if no improvement, agrees to proceed with sports med referral

## 2013-06-06 ENCOUNTER — Other Ambulatory Visit: Payer: Self-pay | Admitting: Family Medicine

## 2013-06-26 ENCOUNTER — Other Ambulatory Visit: Payer: Self-pay | Admitting: Family Medicine

## 2013-07-12 ENCOUNTER — Other Ambulatory Visit: Payer: Self-pay

## 2013-07-14 ENCOUNTER — Ambulatory Visit (INDEPENDENT_AMBULATORY_CARE_PROVIDER_SITE_OTHER): Payer: Self-pay | Admitting: *Deleted

## 2013-07-14 VITALS — BP 144/81 | HR 65 | Temp 97.5°F | Resp 15 | Wt 123.5 lb

## 2013-07-14 DIAGNOSIS — Z21 Asymptomatic human immunodeficiency virus [HIV] infection status: Secondary | ICD-10-CM

## 2013-07-14 DIAGNOSIS — B20 Human immunodeficiency virus [HIV] disease: Secondary | ICD-10-CM

## 2013-07-14 LAB — COMPREHENSIVE METABOLIC PANEL
AST: 31 U/L (ref 0–37)
Albumin: 4.6 g/dL (ref 3.5–5.2)
Alkaline Phosphatase: 47 U/L (ref 39–117)
BUN: 16 mg/dL (ref 6–23)
Chloride: 100 mEq/L (ref 96–112)
Glucose, Bld: 102 mg/dL — ABNORMAL HIGH (ref 70–99)
Potassium: 4.7 mEq/L (ref 3.5–5.3)
Total Bilirubin: 0.5 mg/dL (ref 0.3–1.2)

## 2013-07-14 NOTE — Progress Notes (Signed)
[name] is here with his granddaughter for his last A5314 study visit (week 31). His assessment is unchanged from last study visit. He denies any new problems, symptoms, or signs. He had seen commercials about Prevnar 13 and wondered if this is something that would benefit him. I told him this is something to discuss at his upcoming visit with Dr. Orvan Falconer on 07/26/13. Fasting labs were drawn and BP slightly elevated. He received $50 gift card for study visit. Tacey Heap RN

## 2013-07-21 ENCOUNTER — Encounter: Payer: Self-pay | Admitting: Family Medicine

## 2013-07-22 ENCOUNTER — Other Ambulatory Visit: Payer: Self-pay | Admitting: Internal Medicine

## 2013-07-25 ENCOUNTER — Other Ambulatory Visit: Payer: Self-pay | Admitting: *Deleted

## 2013-07-25 DIAGNOSIS — B20 Human immunodeficiency virus [HIV] disease: Secondary | ICD-10-CM

## 2013-07-25 MED ORDER — LAMIVUDINE-ZIDOVUDINE 150-300 MG PO TABS: ORAL_TABLET | ORAL | Status: DC

## 2013-07-25 MED ORDER — NEVIRAPINE 200 MG PO TABS: 200.0000 mg | ORAL_TABLET | Freq: Two times a day (BID) | ORAL | Status: DC

## 2013-07-26 ENCOUNTER — Ambulatory Visit (INDEPENDENT_AMBULATORY_CARE_PROVIDER_SITE_OTHER): Payer: Medicare Other | Admitting: Internal Medicine

## 2013-07-26 ENCOUNTER — Encounter: Payer: Self-pay | Admitting: Internal Medicine

## 2013-07-26 VITALS — BP 154/87 | HR 69 | Temp 97.7°F | Ht 63.0 in | Wt 125.0 lb

## 2013-07-26 DIAGNOSIS — B20 Human immunodeficiency virus [HIV] disease: Secondary | ICD-10-CM | POA: Diagnosis not present

## 2013-07-26 NOTE — Progress Notes (Signed)
Patient ID: [name], male   DOB: [birth year], 67 y.o.   MRN: [id number]          [facility]  Patient Active Problem List   Diagnosis Date Noted  . DIABETES MELLITUS, TYPE II 09/03/2006    Priority: High  . HIV INFECTION 05/12/2006    Priority: High  . DYSLIPIDEMIA 05/12/2006    Priority: High  . CORONARY ARTERY DISEASE 05/12/2006    Priority: High  . Epicondylitis 06/05/2013  . Nocturia 03/06/2013  . Inguinal adenopathy 01/11/2013  . Dermatitis 11/27/2012  . Dry mouth 07/06/2012  . Night sweats 07/06/2012  . Erectile dysfunction 01/01/2012  . Lipodystrophy 12/20/2010  . Dry eye syndrome 12/20/2010  . BELLS PALSY 07/19/2010  . GENITAL HERPES 05/03/2009  . SHINGLES, HX OF 05/03/2009  . WEIGHT LOSS, ABNORMAL 04/03/2009  . INGUINAL LYMPHADENOPATHY, RIGHT 04/03/2009  . MEMORY LOSS 09/18/2008  . PERIPHERAL VASCULAR DISEASE 07/20/2008  . COPD 07/20/2008  . HIP PAIN, BILATERAL 07/17/2008  . KNEE PAIN, BILATERAL 07/17/2008  . HEMATOCHEZIA 04/18/2008  . NEPHROLITHIASIS, HX OF 02/22/2008  . DEPRESSION 09/03/2006  . GERD 09/03/2006  . CORONARY ARTERY BYPASS GRAFT, HX OF 09/03/2006  . HEARING LOSS, SENSORINEURAL 05/12/2006  . HYPERTENSION 05/12/2006    Patient's Medications  New Prescriptions   No medications on file  Previous Medications   AMLODIPINE (NORVASC) 2.5 MG TABLET    Take 1 tablet (2.5 mg total) by mouth daily.   ASPIRIN (ASPIRIN CHILDRENS) 81 MG CHEWABLE TABLET    Chew 1 tablet (81 mg total) by mouth daily.   ATENOLOL (TENORMIN) 50 MG TABLET    Take 1 tablet (50 mg total) by mouth daily.   ATORVASTATIN (LIPITOR) 20 MG TABLET    Take 1 tablet (20 mg total) by mouth daily.   FENOFIBRATE 160 MG TABLET    TAKE 1 TABLET BY MOUTH DAILY   FOLIC ACID (FOLVITE) 1 MG TABLET    Take 1 mg by mouth daily.   FREESTYLE TEST STRIPS TEST STRIP    USE TO CKECK BLOOD SUGAR ONCE A DAY   GLUCERNA (GLUCERNA) LIQD    Take 1 Can by mouth 2 (two) times  daily between meals.   GLUCOSE BLOOD (FREESTYLE TEST STRIPS) TEST STRIP    Check blood sugar once a day  DX 250.00   LAMIVUDINE-ZIDOVUDINE (COMBIVIR) 150-300 MG PER TABLET    TAKE 1 TABLET BY MOUTH TWICE DAILY   LANCETS (FREESTYLE) LANCETS    USE TO CHECK BLOOD SUGAR ONCE A DAY   LISINOPRIL-HYDROCHLOROTHIAZIDE (PRINZIDE,ZESTORETIC) 20-12.5 MG PER TABLET    Take 2 tablets by mouth daily.   METFORMIN (GLUCOPHAGE) 500 MG TABLET    Take 1 tablet (500 mg total) by mouth 2 (two) times daily with a meal.   METHOTREXATE (RHEUMATREX) 2.5 MG TABLET    Take 2.5 mg by mouth once a week. Caution:Chemotherapy. Protect from light.   NEVIRAPINE (VIRAMUNE) 200 MG TABLET    Take 1 tablet (200 mg total) by mouth 2 (two) times daily.   OMEPRAZOLE (PRILOSEC) 20 MG CAPSULE    Take 1 capsule (20 mg total) by mouth daily.   TRIAMCINOLONE CREAM (KENALOG) 0.1 %    Apply topically at bedtime as needed.   VALACYCLOVIR (VALTREX) 500 MG TABLET    TAKE 1 TABLET BY MOUTH TWICE DAILY  Modified Medications   No medications on file  Discontinued Medications   No medications on file    Subjective: [name] is in for  his routine visit. As usual, he does not recall missing any doses of Combivir or Viramune since his last visit. He is feeling well without any current complaints.  Review of Systems: Pertinent items are noted in HPI.  Past Medical History  Diagnosis Date  . Arthritis   . Diabetes mellitus   . History of depression   . GERD (gastroesophageal reflux disease)   . CAD (coronary artery disease)     2003- cabg  . Hypertension   . Hyperlipidemia   . History of kidney stones   . COPD (chronic obstructive pulmonary disease)   . HIV (human immunodeficiency virus infection)   . Dermatitis 11/27/2012  . Nocturia 03/06/2013  . Epicondylitis 06/05/2013    right    History  Substance Use Topics  . Smoking status: Never Smoker   . Smokeless tobacco: Never Used  . Alcohol Use: No    Family History  Problem  Relation Age of Onset  . Arthritis      mother/father/paternal grandparents  . Breast cancer Maternal Aunt     paternal aunt  . Lung cancer Maternal Aunt   . Hyperlipidemia Mother   . Hyperlipidemia Father   . Heart disease      parents/maternal grandparents/ 2 brothers  . Stroke Paternal Grandmother   . Hypertension Father     paternal grandmother/3 brothers/1 sister  . Mental retardation Sister   . Diabetes Mother     paternal grandparents/1 brother    No Known Allergies  Objective: Temp: 97.7 F (36.5 C) (12/23 0949) Temp src: Oral (12/23 0949) BP: 154/87 mmHg (12/23 0949) Pulse Rate: 69 (12/23 0949)  General: He is in no distress. His weight is stable at 125 pounds Oral: No oropharyngeal lesions Skin: No rash Lungs: Clear Cor: Regular S1 and S2 no murmurs And affect: Normal  Lab Results Lab Results  Component Value Date   WBC 3.8* 05/24/2013   HGB 13.0 05/24/2013   HCT 35.7* 05/24/2013   MCV 113.0* 05/24/2013   PLT 183 05/24/2013    Lab Results  Component Value Date   CREATININE 0.82 07/14/2013   BUN 16 07/14/2013   NA 139 07/14/2013   K 4.7 07/14/2013   CL 100 07/14/2013   CO2 27 07/14/2013    Lab Results  Component Value Date   ALT 36 07/14/2013   AST 31 07/14/2013   ALKPHOS 47 07/14/2013   BILITOT 0.5 07/14/2013    Lab Results  Component Value Date   CHOL 118 05/24/2013   HDL 26* 05/24/2013   LDLCALC 35 05/24/2013   LDLDIRECT 68 05/04/2007   TRIG 285* 05/24/2013   CHOLHDL 4.5 05/24/2013    Lab Results HIV 1 RNA Quant (copies/mL)  Date Value  09/29/2012 <20   06/22/2012 <20   04/17/2011 <20      HIV-1 RNA Viral Load (no units)  Date Value  04/13/2013 <40   01/18/2013 <40   12/23/2012 <40      CD4 T Cell Abs (cmm)  Date Value  06/22/2012 430   04/17/2011 610   07/02/2010 490    CD4 04/13/2013: 498  Assessment: Amear's HIV infection remains under excellent control. He is on a twice-daily multidrug antiretroviral regimen but  does not want to switch to a simpler regimen since his is working so well.  Plan: 1. Continue Combivir and Viramune 2. Followup in 6 months   Cliffton Asters, MD [facility] [phone] pager   [phone] cell  07/26/2013, 10:12 AM

## 2013-07-28 ENCOUNTER — Encounter: Payer: Self-pay | Admitting: Internal Medicine

## 2013-08-17 ENCOUNTER — Encounter: Payer: Self-pay | Admitting: Internal Medicine

## 2013-08-17 ENCOUNTER — Other Ambulatory Visit: Payer: Self-pay | Admitting: Family Medicine

## 2013-08-17 LAB — CD4/CD8 (T-HELPER/T-SUPPRESSOR CELL)
CD4%: 51.4
CD4: 514
CD8 % Suppressor T Cell: 24.1
CD8: 241

## 2013-08-17 LAB — HIV-1 RNA QUANT-NO REFLEX-BLD: HIV-1 RNA Viral Load: <40

## 2013-08-19 ENCOUNTER — Encounter: Payer: Self-pay | Admitting: *Deleted

## 2013-08-23 ENCOUNTER — Other Ambulatory Visit: Payer: Self-pay | Admitting: *Deleted

## 2013-08-23 ENCOUNTER — Ambulatory Visit: Payer: Self-pay

## 2013-08-23 DIAGNOSIS — B20 Human immunodeficiency virus [HIV] disease: Secondary | ICD-10-CM

## 2013-08-23 MED ORDER — LAMIVUDINE-ZIDOVUDINE 150-300 MG PO TABS: ORAL_TABLET | ORAL | Status: DC

## 2013-08-29 DIAGNOSIS — I10 Essential (primary) hypertension: Secondary | ICD-10-CM | POA: Diagnosis not present

## 2013-08-29 DIAGNOSIS — E785 Hyperlipidemia, unspecified: Secondary | ICD-10-CM | POA: Diagnosis not present

## 2013-08-29 DIAGNOSIS — E119 Type 2 diabetes mellitus without complications: Secondary | ICD-10-CM | POA: Diagnosis not present

## 2013-08-29 LAB — RENAL FUNCTION PANEL
Albumin: 4.3 g/dL (ref 3.5–5.2)
BUN: 13 mg/dL (ref 6–23)
CO2: 27 mEq/L (ref 19–32)
Calcium: 8.9 mg/dL (ref 8.4–10.5)
Chloride: 101 mEq/L (ref 96–112)
Creat: 0.77 mg/dL (ref 0.50–1.35)
Glucose, Bld: 208 mg/dL — ABNORMAL HIGH (ref 70–99)
Phosphorus: 2.3 mg/dL (ref 2.3–4.6)
Potassium: 4.2 mEq/L (ref 3.5–5.3)
Sodium: 136 mEq/L (ref 135–145)

## 2013-08-29 LAB — HEPATIC FUNCTION PANEL
ALT: 38 U/L (ref 0–53)
AST: 27 U/L (ref 0–37)
Albumin: 4.3 g/dL (ref 3.5–5.2)
Alkaline Phosphatase: 54 U/L (ref 39–117)
Bilirubin, Direct: 0.1 mg/dL (ref 0.0–0.3)
Indirect Bilirubin: 0.5 mg/dL (ref 0.0–0.9)
Total Bilirubin: 0.6 mg/dL (ref 0.3–1.2)
Total Protein: 6.4 g/dL (ref 6.0–8.3)

## 2013-08-29 LAB — TSH: TSH: 1.683 u[IU]/mL (ref 0.350–4.500)

## 2013-08-29 LAB — LIPID PANEL
Cholesterol: 123 mg/dL (ref 0–200)
HDL: 24 mg/dL — ABNORMAL LOW (ref >39)
LDL Cholesterol: 42 mg/dL (ref 0–99)
Total CHOL/HDL Ratio: 5.1 Ratio
Triglycerides: 284 mg/dL — ABNORMAL HIGH (ref <150)
VLDL: 57 mg/dL — ABNORMAL HIGH (ref 0–40)

## 2013-08-29 LAB — CBC
HCT: 36.7 % — ABNORMAL LOW (ref 39.0–52.0)
Hemoglobin: 13.2 g/dL (ref 13.0–17.0)
MCH: 41.9 pg — ABNORMAL HIGH (ref 26.0–34.0)
MCHC: 36 g/dL (ref 30.0–36.0)
MCV: 116.5 fL — ABNORMAL HIGH (ref 78.0–100.0)
Platelets: 161 10*3/uL (ref 150–400)
RBC: 3.15 MIL/uL — ABNORMAL LOW (ref 4.22–5.81)
RDW: 13.4 % (ref 11.5–15.5)
WBC: 3.4 10*3/uL — ABNORMAL LOW (ref 4.0–10.5)

## 2013-08-29 LAB — HEMOGLOBIN A1C
Hgb A1c MFr Bld: 6.4 % — ABNORMAL HIGH (ref <5.7)
Mean Plasma Glucose: 137 mg/dL — ABNORMAL HIGH (ref <117)

## 2013-08-30 ENCOUNTER — Telehealth: Payer: Self-pay | Admitting: *Deleted

## 2013-08-30 NOTE — Telephone Encounter (Signed)
Provided requested information to [employer].  Obtained Prior Approval for Valacyclovir through January 2016.

## 2013-09-05 ENCOUNTER — Telehealth: Payer: Self-pay | Admitting: Family Medicine

## 2013-09-05 ENCOUNTER — Ambulatory Visit (INDEPENDENT_AMBULATORY_CARE_PROVIDER_SITE_OTHER): Payer: Medicare Other | Admitting: Family Medicine

## 2013-09-05 ENCOUNTER — Encounter: Payer: Self-pay | Admitting: Family Medicine

## 2013-09-05 VITALS — BP 118/68 | HR 71 | Temp 97.8°F | Ht 63.0 in | Wt 124.1 lb

## 2013-09-05 DIAGNOSIS — M79609 Pain in unspecified limb: Secondary | ICD-10-CM

## 2013-09-05 DIAGNOSIS — E119 Type 2 diabetes mellitus without complications: Secondary | ICD-10-CM

## 2013-09-05 DIAGNOSIS — Z0000 Encounter for general adult medical examination without abnormal findings: Secondary | ICD-10-CM

## 2013-09-05 DIAGNOSIS — I10 Essential (primary) hypertension: Secondary | ICD-10-CM

## 2013-09-05 DIAGNOSIS — R351 Nocturia: Secondary | ICD-10-CM

## 2013-09-05 DIAGNOSIS — M79601 Pain in right arm: Secondary | ICD-10-CM

## 2013-09-05 DIAGNOSIS — E785 Hyperlipidemia, unspecified: Secondary | ICD-10-CM

## 2013-09-05 NOTE — Patient Instructions (Signed)
Vitamin B complex daily  Anemia, Nonspecific Anemia is a condition in which the concentration of red blood cells or hemoglobin in the blood is below normal. Hemoglobin is a substance in red blood cells that carries oxygen to the tissues of the body. Anemia results in not enough oxygen reaching these tissues.  CAUSES  Common causes of anemia include:   Excessive bleeding. Bleeding may be internal or external. This includes excessive bleeding from periods (in women) or from the intestine.   Poor nutrition.   Chronic kidney, thyroid, and liver disease.  Bone marrow disorders that decrease red blood cell production.  Cancer and treatments for cancer.  HIV, AIDS, and their treatments.  Spleen problems that increase red blood cell destruction.  Blood disorders.  Excess destruction of red blood cells due to infection, medicines, and autoimmune disorders. SIGNS AND SYMPTOMS   Minor weakness.   Dizziness.   Headache.  Palpitations.   Shortness of breath, especially with exercise.   Paleness.  Cold sensitivity.  Indigestion.  Nausea.  Difficulty sleeping.  Difficulty concentrating. Symptoms may occur suddenly or they may develop slowly.  DIAGNOSIS  Additional blood tests are often needed. These help your health care provider determine the best treatment. Your health care provider will check your stool for blood and look for other causes of blood loss.  TREATMENT  Treatment varies depending on the cause of the anemia. Treatment can include:   Supplements of iron, vitamin Q03, or folic acid.   Hormone medicines.   A blood transfusion. This may be needed if blood loss is severe.   Hospitalization. This may be needed if there is significant continual blood loss.   Dietary changes.  Spleen removal. HOME CARE INSTRUCTIONS Keep all follow-up appointments. It often takes many weeks to correct anemia, and having your health care provider check on your  condition and your response to treatment is very important. SEEK IMMEDIATE MEDICAL CARE IF:   You develop extreme weakness, shortness of breath, or chest pain.   You become dizzy or have trouble concentrating.  You develop heavy vaginal bleeding.   You develop a rash.   You have bloody or black, tarry stools.   You faint.   You vomit up blood.   You vomit repeatedly.   You have abdominal pain.  You have a fever or persistent symptoms for more than 2 3 days.   You have a fever and your symptoms suddenly get worse.   You are dehydrated.  MAKE SURE YOU:  Understand these instructions.  Will watch your condition.  Will get help right away if you are not doing well or get worse. Document Released: 08/28/2004 Document Revised: 03/23/2013 Document Reviewed: 01/14/2013 [facility] Patient Information 2014 [place].

## 2013-09-05 NOTE — Progress Notes (Signed)
Pre visit review using our clinic review tool, if applicable. No additional management support is needed unless otherwise documented below in the visit note. 

## 2013-09-05 NOTE — Progress Notes (Signed)
Subjective:     Patient ID: [name], male   DOB: 11/13/1945, 68 y.o.   MRN: [id number]   CC: 3 mo f/u DM  HPI  1. DM- Pt is here for his 3 mo f/u for Diabetes Type II. He was last seen 06/02/2013 and was well controlled. He checks blood sugars in the morning before breakfast and most commonly glucose levels run 100-115. PT reports starting walking at fitness center.   2. Lab results were discussed with the patient. States he has never been told he has anemia.   3. Pt has h/o right lateral epicondylitis. He saw Dr. Charlett Blake for this in 05/2013 and has since tried aspercream and salon paws cream. This provides moderate relief but then 1-2 hours later the discomfort and soreness return. He reports occasional swelling at his elbow but none today. Reports feeling weakness in his right arm when he grasps dishes and sharp pain in elbow.   4. Reports nocturia 3-4x per night, incomplete voiding, h/o ED. Would like referral to urologist.   Review of Systems  Constitutional: Negative for fatigue.  Genitourinary: Positive for frequency and difficulty urinating.  Musculoskeletal: Positive for arthralgias, joint swelling and myalgias.       Positive findings specific to right elbow and right forearm  Skin: Negative for wound.  Neurological: Positive for weakness.       Right arm       Objective:   Physical Exam  Nursing note and vitals reviewed. Constitutional: He is oriented to person, place, and time. Vital signs are normal. He appears well-developed and well-nourished. No distress.  HENT:  Head: Normocephalic and atraumatic.  Cardiovascular: Normal rate, regular rhythm and normal heart sounds.   Pulmonary/Chest: Effort normal and breath sounds normal. No respiratory distress.  Musculoskeletal: He exhibits tenderness.       Right forearm: He exhibits tenderness. He exhibits no swelling, no edema and no deformity.       Arms: Neurological: He is alert and oriented to person, place, and  time.  Skin: Skin is warm and dry. No rash noted. No erythema.  Psychiatric: He has a normal mood and affect.    Filed Vitals:   09/05/13 1120  BP: 118/68  Pulse: 71  Temp: 97.8 F (36.6 C)   Lab Results  Component Value Date   WBC 3.4* 08/29/2013   HGB 13.2 08/29/2013   HCT 36.7* 08/29/2013   MCV 116.5* 08/29/2013   PLT 161 08/29/2013   Lab Results  Component Value Date   HGBA1C 6.4* 08/29/2013       Assessment:      1. DM ty II - controlled on current regimen. 2. Right lateral epicondylitis.  3. Mild anemia per lab results (08/29/2013) 4. BPH     Plan:       1. DM ty II - Continue current regimen.  2. Right Lateral epicondylitis - Referral to Dr. Tamala Julian 3. Anemia - Start B complex vitamins.  Add B12 level to next set of labs and Instrinsic factor  4. BPH- urology referral 5. F/u 3 mo with CBC, Lipid panel, CMP, HgbA1c.   02.02.2015  Martinique Hausladen, PA-S     Patient seen, interviewed and examined with student agree with documentation

## 2013-09-05 NOTE — Telephone Encounter (Signed)
Lab order week of 12/02/13  Lipids, renal, hepatic, tsh, cbc, hgba1c, vitamin b 12 level  Patient will be going to [facility] lab

## 2013-09-05 NOTE — Assessment & Plan Note (Signed)
Incomplete void and ED referred to [facility] for further

## 2013-09-06 NOTE — Telephone Encounter (Signed)
Hgba1c, lipid, renal, cbc, tsh, hepatic  

## 2013-09-06 NOTE — Telephone Encounter (Signed)
Lab order placed EXCEPT B12  B12 is not covered by patients insurance. Would you like it still ran and if so what diagnosis code?

## 2013-09-08 ENCOUNTER — Telehealth: Payer: Self-pay

## 2013-09-08 NOTE — Telephone Encounter (Signed)
Relevant patient education assigned to patient using Emmi. ° °

## 2013-09-12 ENCOUNTER — Other Ambulatory Visit (INDEPENDENT_AMBULATORY_CARE_PROVIDER_SITE_OTHER): Payer: Medicare Other

## 2013-09-12 ENCOUNTER — Ambulatory Visit (INDEPENDENT_AMBULATORY_CARE_PROVIDER_SITE_OTHER): Payer: Medicare Other | Admitting: Family Medicine

## 2013-09-12 ENCOUNTER — Encounter: Payer: Self-pay | Admitting: Family Medicine

## 2013-09-12 VITALS — BP 138/60 | HR 74 | Temp 97.7°F | Resp 16 | Wt 125.1 lb

## 2013-09-12 DIAGNOSIS — M79601 Pain in right arm: Secondary | ICD-10-CM

## 2013-09-12 DIAGNOSIS — M7710 Lateral epicondylitis, unspecified elbow: Secondary | ICD-10-CM

## 2013-09-12 DIAGNOSIS — IMO0002 Reserved for concepts with insufficient information to code with codable children: Secondary | ICD-10-CM

## 2013-09-12 DIAGNOSIS — M79609 Pain in unspecified limb: Secondary | ICD-10-CM

## 2013-09-12 NOTE — Patient Instructions (Addendum)
Great to meet you Wear the brace day and night for next 10 days, then nightly for 10 nights.  Ice 10 minutes 2 times a day Ibuprofen 600mg  2 times a day for 3 days.  Exercises most days of the week.  Come back and see me in 3 weeks.

## 2013-09-12 NOTE — Progress Notes (Signed)
[facility] [place], [place] [address] Phone: [phone] Subjective:    I'm seeing this patient by the request  of:  Penni Homans, MD   CC: Right elbow pain  [id number] [name] is a 68 y.o. male coming in with complaint of  Right elbow pain. Patient states he's had this pain since September after he was doing some yard work. Patient states it hurts more on the posterior aspect over the lateral epicondylar region is where he points. Patient states it hurts more with certain movements but he does not know exactly which ones. Patient has tried some anti-inflammatories as well as topicals with minimal improvement. Patient states it is slowly improving over the course of time. Denies any numbness but states that at its worse he did feel like he had some weakness. He does think this is improving though slowly. Patient is able to do activities of daily living but can have a dull throbbing aching sensation with certain activities as well as at night. Patient was in severity a 6/10. Patient is right-hand-dominant     Past medical history, social, surgical and family history all reviewed in electronic medical record.   Review of Systems: No headache, visual changes, nausea, vomiting, diarrhea, constipation, dizziness, abdominal pain, skin rash, fevers, chills, night sweats, weight loss, swollen lymph nodes, body aches, joint swelling, muscle aches, chest pain, shortness of breath, mood changes.   Objective Blood pressure 138/60, pulse 74, temperature 97.7 F (36.5 C), temperature source Oral, resp. rate 16, weight 125 lb 1.3 oz (56.736 kg), SpO2 98.00%.  General: No apparent distress alert and oriented x3 mood and affect normal, dressed appropriately.  HEENT: Pupils equal, extraocular movements intact  Respiratory: Patient's speak in full sentences and does not appear short of breath  Cardiovascular: No lower extremity edema, non tender, no  erythema  Skin: Warm dry intact with no signs of infection or rash on extremities or on axial skeleton.  Abdomen: Soft nontender  Neuro: Cranial nerves II through XII are intact, neurovascularly intact in all extremities with 2+ DTRs and 2+ pulses.  Lymph: No lymphadenopathy of posterior or anterior cervical chain or axillae bilaterally.  Gait normal with good balance and coordination.  MSK: Non tender with full range of motion and good stability and symmetric strength and tone of shoulders, , wrist,hip  knee and ankles bilaterally.  Elbow: Right Unremarkable to inspection. Range of motion full pronation, supination, flexion, extension. Strength is full to all of the above directions Stable to varus, valgus stress. Negative moving valgus stress test. Patient does have pain against resistance of wrist extension. Pain is concentrated over the lateral epicondylar region Tender to palpation over the lateral epicondylar region  Ulnar nerve does not sublux. Negative cubital tunnel Tinel's. Contralateral unremarkable  Musculoskeletal ultrasound was performed and interpreted by Charlann Boxer D.O.   Elbow:  Lateral epi right condyle and common extensor tendon origin visualized. Trace tendon sheath effusion noted. No true tear appreciated to. I do not see any avulsion either appear Radial head unremarkable and located in annular ligament Medial epicondyle and common flexor tendon origin visualized.  No edema, effusions, or avulsions seen. Ulnar nerve in cubital tunnel unremarkable. Olecranon and triceps insertion visualized and unremarkable without edema, effusion, or avulsion.  No signs olecranon bursitis. Power doppler signal normal.  IMPRESSION:  Lateral epicondylitis   Impression and Recommendations:     This case required medical decision making of moderate complexity.

## 2013-09-12 NOTE — Assessment & Plan Note (Signed)
Lateral Epicondylitis: Elbow anatomy was reviewed, and tendinopathy was explained.  Pt. given a formal rehab program from Dr. Rosana Berger at [facility] and the [facility] on elbow rehabiliation.   Series of concentric and eccentric exercises should be done starting with no weight, work up to 1 lb, hammer, etc.  Use counterforce strap if working or using hands.  Formal PT would be beneficial. Emphasized stretching an cross-friction massage Emphasized proper palms up lifting biomechanics to unload ECRB Discuss anti-inflammatories and over-the-counter medications that can be helpful. Patient was also given a wrist brace today. Patient will come back in 3-4 weeks. He continues to have pain we'll consider injection. Patient is not a candidate for nitroglycerin secondary to his history of coronary artery disease.

## 2013-09-12 NOTE — Progress Notes (Signed)
Pre-visit discussion using our clinic review tool. No additional management support is needed unless otherwise documented below in the visit note.  

## 2013-09-19 ENCOUNTER — Other Ambulatory Visit: Payer: Self-pay | Admitting: Family Medicine

## 2013-09-22 ENCOUNTER — Other Ambulatory Visit: Payer: Self-pay | Admitting: Family Medicine

## 2013-09-23 NOTE — Telephone Encounter (Signed)
Rx request to pharmacy/[facility]  

## 2013-09-26 DIAGNOSIS — N138 Other obstructive and reflux uropathy: Secondary | ICD-10-CM | POA: Diagnosis not present

## 2013-09-26 DIAGNOSIS — N401 Enlarged prostate with lower urinary tract symptoms: Secondary | ICD-10-CM | POA: Diagnosis not present

## 2013-09-26 DIAGNOSIS — N139 Obstructive and reflux uropathy, unspecified: Secondary | ICD-10-CM | POA: Diagnosis not present

## 2013-09-26 DIAGNOSIS — R351 Nocturia: Secondary | ICD-10-CM | POA: Diagnosis not present

## 2013-09-26 DIAGNOSIS — N529 Male erectile dysfunction, unspecified: Secondary | ICD-10-CM | POA: Diagnosis not present

## 2013-09-30 ENCOUNTER — Other Ambulatory Visit: Payer: Self-pay | Admitting: *Deleted

## 2013-09-30 DIAGNOSIS — B20 Human immunodeficiency virus [HIV] disease: Secondary | ICD-10-CM

## 2013-09-30 MED ORDER — NEVIRAPINE 200 MG PO TABS: 200.0000 mg | ORAL_TABLET | Freq: Two times a day (BID) | ORAL | Status: DC

## 2013-09-30 MED ORDER — LAMIVUDINE-ZIDOVUDINE 150-300 MG PO TABS: ORAL_TABLET | ORAL | Status: DC

## 2013-10-04 ENCOUNTER — Ambulatory Visit (INDEPENDENT_AMBULATORY_CARE_PROVIDER_SITE_OTHER): Payer: Medicare Other | Admitting: Family Medicine

## 2013-10-04 ENCOUNTER — Encounter: Payer: Self-pay | Admitting: Family Medicine

## 2013-10-04 VITALS — BP 122/64 | HR 64 | Temp 97.4°F | Resp 16 | Wt 123.0 lb

## 2013-10-04 DIAGNOSIS — IMO0002 Reserved for concepts with insufficient information to code with codable children: Secondary | ICD-10-CM

## 2013-10-04 DIAGNOSIS — M7710 Lateral epicondylitis, unspecified elbow: Secondary | ICD-10-CM | POA: Diagnosis not present

## 2013-10-04 NOTE — Assessment & Plan Note (Signed)
Discussed with patient at great length. We discussed were going to do over the course of the next 4-6 weeks. Patient given a home exercise program there will be more for strengthening. In addition to this with Dr. topical and was given a sample. Patient will try this if he is having any discomfort Continue icing. Discussed with patient that he can wear the wrist brace that he had previously at night if he has any trouble. As long as patient continues to improve we will see him in 4-6 weeks.

## 2013-10-04 NOTE — Patient Instructions (Signed)
Good to see you Try exercises 3 times a week and ice after a lot of activity If pain worsen wear brace at night again.  Avoid too many activities extend your wrist.  Come back if you need me.  Should resolve completely in 4-6 weeks.

## 2013-10-04 NOTE — Progress Notes (Signed)
Pre visit review using our clinic review tool, if applicable. No additional management support is needed unless otherwise documented below in the visit note. 

## 2013-10-04 NOTE — Progress Notes (Signed)
  [facility] [place], [place] [address] Phone: [phone] Subjective:     CC: Right elbow pain follow up  [id number] [name] is a 68 y.o. male coming in with complaint of  Right elbow pain follow up. Patient was diagnosed with a lateral epicondylitis. Patient was given home exercise program, icing protocol, over-the-counter medications. Patient states that he is approximately 75% better. Patient is able to do all activities of daily living without any discomfort but states that he can have a dull aching sensation with certain movements. Patient states that he lifts anything for any extended amount of time he has some discomfort. No pain at night though. Denies any new symptoms.    Past medical history, social, surgical and family history all reviewed in electronic medical record.   Review of Systems: No headache, visual changes, nausea, vomiting, diarrhea, constipation, dizziness, abdominal pain, skin rash, fevers, chills, night sweats, weight loss, swollen lymph nodes, body aches, joint swelling, muscle aches, chest pain, shortness of breath, mood changes.   Objective Blood pressure 122/64, pulse 64, temperature 97.4 F (36.3 C), temperature source Oral, resp. rate 16, weight 123 lb (55.792 kg), SpO2 97.00%.  General: No apparent distress alert and oriented x3 mood and affect normal, dressed appropriately.  HEENT: Pupils equal, extraocular movements intact  Respiratory: Patient's speak in full sentences and does not appear short of breath  Cardiovascular: No lower extremity edema, non tender, no erythema  Skin: Warm dry intact with no signs of infection or rash on extremities or on axial skeleton.  Abdomen: Soft nontender  Neuro: Cranial nerves II through XII are intact, neurovascularly intact in all extremities with 2+ DTRs and 2+ pulses.  Lymph: No lymphadenopathy of posterior or anterior cervical chain or axillae bilaterally.  Gait  normal with good balance and coordination.  MSK: Non tender with full range of motion and good stability and symmetric strength and tone of shoulders, , wrist,hip  knee and ankles bilaterally.  Elbow: Right Unremarkable to inspection. Range of motion full pronation, supination, flexion, extension. Strength is full to all of the above directions Stable to varus, valgus stress. Negative moving valgus stress test. Patient does have pain against resistance of wrist extension. Pain is concentrated over the lateral epicondylar region Tender to palpation over the lateral epicondylar region but improved from previous visit Ulnar nerve does not sublux. Negative cubital tunnel Tinel's. Contralateral unremarkable     Impression and Recommendations:     This case required medical decision making of moderate complexity. Spent greater than 25 minutes with patient face-to-face and had greater than 50% of counseling including as described above in assessment and plan.

## 2013-10-08 ENCOUNTER — Ambulatory Visit (INDEPENDENT_AMBULATORY_CARE_PROVIDER_SITE_OTHER): Payer: Medicare Other | Admitting: Family Medicine

## 2013-10-08 ENCOUNTER — Encounter: Payer: Self-pay | Admitting: Family Medicine

## 2013-10-08 VITALS — BP 120/70 | HR 84 | Temp 96.5°F | Ht 63.0 in | Wt 124.5 lb

## 2013-10-08 DIAGNOSIS — I10 Essential (primary) hypertension: Secondary | ICD-10-CM

## 2013-10-08 DIAGNOSIS — E119 Type 2 diabetes mellitus without complications: Secondary | ICD-10-CM | POA: Diagnosis not present

## 2013-10-08 DIAGNOSIS — J329 Chronic sinusitis, unspecified: Secondary | ICD-10-CM | POA: Diagnosis not present

## 2013-10-08 MED ORDER — CEFDINIR 300 MG PO CAPS: 300.0000 mg | ORAL_CAPSULE | Freq: Two times a day (BID) | ORAL | Status: AC

## 2013-10-08 NOTE — Assessment & Plan Note (Signed)
Well controlled no changes 

## 2013-10-08 NOTE — Assessment & Plan Note (Signed)
Started on Cefdinir, probiotics and Mucinex.

## 2013-10-08 NOTE — Progress Notes (Signed)
Patient ID: [name], male   DOB: January 17, 1946, 68 y.o.   MRN: [id number] [name] [id number] Mar 13, 1946 10/08/2013      Progress Note-Follow Up  Subjective  Chief Complaint  Chief Complaint  Patient presents with  . Nasal Congestion    head stopped up, cough, sneezing x 8 days    HPI  Patient is a 68 year old American male who is in today for evaluation of sinus symptoms. He is now about 8 days now. Is struggling with fatigue and malaise. Has had congestion and cough productive of green sputum. Denies any obvious fevers but has had some low-grade chills and mild headaches. Tylenol marginally helpful. He's had a sore throat as well as some postnasal drip and chills. Had infrequent episodes of diarrhea but that is resolved no bloody or tarry stool. Has gotten mild relief from Zyrtec at times but no relief from [facility]  Past Medical History  Diagnosis Date  . Arthritis   . Diabetes mellitus   . History of depression   . GERD (gastroesophageal reflux disease)   . CAD (coronary artery disease)     2003- cabg  . Hypertension   . Hyperlipidemia   . History of kidney stones   . COPD (chronic obstructive pulmonary disease)   . HIV (human immunodeficiency virus infection)   . Dermatitis 11/27/2012  . Nocturia 03/06/2013  . Epicondylitis 06/05/2013    right    Past Surgical History  Procedure Laterality Date  . Coronary artery bypass graft  05/2002  . Vasectomy      Family History  Problem Relation Age of Onset  . Arthritis      mother/father/paternal grandparents  . Breast cancer Maternal Aunt     paternal aunt  . Lung cancer Maternal Aunt   . Hyperlipidemia Mother   . Hyperlipidemia Father   . Heart disease      parents/maternal grandparents/ 2 brothers  . Stroke Paternal Grandmother   . Hypertension Father     paternal grandmother/3 brothers/1 sister  . Mental retardation Sister   . Diabetes Mother     paternal grandparents/1 brother    History   Social History   . Marital Status: Divorced    Spouse Name: N/A    Number of Children: 44  . Years of Education: N/A   Occupational History  . Retired     Disability   Social History Main Topics  . Smoking status: Never Smoker   . Smokeless tobacco: Never Used  . Alcohol Use: No  . Drug Use: No  . Sexual Activity: Not Currently     Comment: declined condoms   Other Topics Concern  . Not on file   Social History Narrative  . No narrative on file    Current Outpatient Prescriptions on File Prior to Visit  Medication Sig Dispense Refill  . amLODipine (NORVASC) 2.5 MG tablet Take 1 tablet (2.5 mg total) by mouth daily.  90 tablet  1  . aspirin (ASPIRIN CHILDRENS) 81 MG chewable tablet Chew 1 tablet (81 mg total) by mouth daily.  30 tablet  11  . atenolol (TENORMIN) 50 MG tablet TAKE ONE TABLET BY MOUTH EVERY DAY  90 tablet  0  . atorvastatin (LIPITOR) 20 MG tablet TAKE 1 TABLET BY MOUTH DAILY  90 tablet  0  . fenofibrate 160 MG tablet TAKE 1 TABLET BY MOUTH EVERY DAY  30 tablet  0  . FREESTYLE TEST STRIPS test strip USE TO CKECK BLOOD SUGAR ONCE  A DAY  100 each  2  . GLUCERNA (GLUCERNA) LIQD Take 1 Can by mouth 2 (two) times daily between meals.  48 Can  5  . glucose blood (FREESTYLE TEST STRIPS) test strip Check blood sugar once a day  DX 250.00  100 each  1  . lamiVUDine-zidovudine (COMBIVIR) 150-300 MG per tablet TAKE 1 TABLET BY MOUTH TWICE DAILY  60 tablet  6  . Lancets (FREESTYLE) lancets USE TO CHECK BLOOD SUGAR ONCE DAILY  100 each  0  . lisinopril-hydrochlorothiazide (PRINZIDE,ZESTORETIC) 20-12.5 MG per tablet TAKE 2 TABLETS BY MOUTH DAILY  60 tablet  0  . metFORMIN (GLUCOPHAGE) 500 MG tablet TAKE ONE TABLET BY MOUTH TWICE DAILY WITH A MEAL  180 tablet  0  . nevirapine (VIRAMUNE) 200 MG tablet Take 1 tablet (200 mg total) by mouth 2 (two) times daily.  60 tablet  6  . omeprazole (PRILOSEC) 20 MG capsule Take 1 capsule (20 mg total) by mouth daily.  30 capsule  11  . triamcinolone cream  (KENALOG) 0.1 % Apply topically at bedtime as needed.  45 g  1  . valACYclovir (VALTREX) 500 MG tablet TAKE 1 TABLET BY MOUTH TWICE DAILY  60 tablet  5   No current facility-administered medications on file prior to visit.    No Known Allergies  Review of Systems  Review of Systems  Constitutional: Negative for fever and malaise/fatigue.  HENT: Negative for congestion.   Eyes: Negative for discharge.  Respiratory: Negative for shortness of breath.   Cardiovascular: Negative for chest pain, palpitations and leg swelling.  Gastrointestinal: Negative for nausea, abdominal pain and diarrhea.  Genitourinary: Negative for dysuria.  Musculoskeletal: Negative for falls.  Skin: Negative for rash.  Neurological: Negative for loss of consciousness and headaches.  Endo/Heme/Allergies: Negative for polydipsia.  Psychiatric/Behavioral: Negative for depression and suicidal ideas. The patient is not nervous/anxious and does not have insomnia.     Objective  BP 120/70  Pulse 84  Temp(Src) 96.5 F (35.8 C) (Oral)  Ht 5\' 3"  (1.6 m)  Wt 124 lb 8 oz (56.473 kg)  BMI 22.06 kg/m2  SpO2 97%  Physical Exam  Physical Exam  Constitutional: He is oriented to person, place, and time and well-developed, well-nourished, and in no distress. No distress.  HENT:  Head: Normocephalic and atraumatic.  Eyes: Conjunctivae are normal.  Neck: Neck supple. No thyromegaly present.  Cardiovascular: Normal rate, regular rhythm and normal heart sounds.   No murmur heard. Pulmonary/Chest: Effort normal and breath sounds normal. No respiratory distress.  Abdominal: He exhibits no distension and no mass. There is no tenderness.  Musculoskeletal: He exhibits no edema.  Neurological: He is alert and oriented to person, place, and time.  Skin: Skin is warm.  Psychiatric: Memory, affect and judgment normal.    Lab Results  Component Value Date   TSH 1.683 08/29/2013   Lab Results  Component Value Date   WBC  3.4* 08/29/2013   HGB 13.2 08/29/2013   HCT 36.7* 08/29/2013   MCV 116.5* 08/29/2013   PLT 161 08/29/2013   Lab Results  Component Value Date   CREATININE 0.77 08/29/2013   BUN 13 08/29/2013   NA 136 08/29/2013   K 4.2 08/29/2013   CL 101 08/29/2013   CO2 27 08/29/2013   Lab Results  Component Value Date   ALT 38 08/29/2013   AST 27 08/29/2013   ALKPHOS 54 08/29/2013   BILITOT 0.6 08/29/2013   Lab Results  Component  Value Date   CHOL 123 08/29/2013   Lab Results  Component Value Date   HDL 24* 08/29/2013   Lab Results  Component Value Date   LDLCALC 42 08/29/2013   Lab Results  Component Value Date   TRIG 284* 08/29/2013   Lab Results  Component Value Date   CHOLHDL 5.1 08/29/2013     Assessment & Plan  Sinusitis Started on Cefdinir, probiotics and Mucinex.   HYPERTENSION Well controlled no changes  DIABETES MELLITUS, TYPE II No spike in sugar despite recently illness continue current eds

## 2013-10-08 NOTE — Assessment & Plan Note (Signed)
No spike in sugar despite recently illness continue current eds

## 2013-10-08 NOTE — Progress Notes (Signed)
Pre-visit discussion using our clinic review tool. No additional management support is needed unless otherwise documented below in the visit note.  

## 2013-10-08 NOTE — Patient Instructions (Signed)
Probiotics such as Digestive Advantage daily Increase fluids Mucinex 600mg  twice daily x 10 days   Sinusitis Sinusitis is redness, soreness, and swelling (inflammation) of the paranasal sinuses. Paranasal sinuses are air pockets within the bones of your face (beneath the eyes, the middle of the forehead, or above the eyes). In healthy paranasal sinuses, mucus is able to drain out, and air is able to circulate through them by way of your nose. However, when your paranasal sinuses are inflamed, mucus and air can become trapped. This can allow bacteria and other germs to grow and cause infection. Sinusitis can develop quickly and last only a short time (acute) or continue over a long period (chronic). Sinusitis that lasts for more than 12 weeks is considered chronic.  CAUSES  Causes of sinusitis include:  Allergies.  Structural abnormalities, such as displacement of the cartilage that separates your nostrils (deviated septum), which can decrease the air flow through your nose and sinuses and affect sinus drainage.  Functional abnormalities, such as when the small hairs (cilia) that line your sinuses and help remove mucus do not work properly or are not present. SYMPTOMS  Symptoms of acute and chronic sinusitis are the same. The primary symptoms are pain and pressure around the affected sinuses. Other symptoms include:  Upper toothache.  Earache.  Headache.  Bad breath.  Decreased sense of smell and taste.  A cough, which worsens when you are lying flat.  Fatigue.  Fever.  Thick drainage from your nose, which often is green and may contain pus (purulent).  Swelling and warmth over the affected sinuses. DIAGNOSIS  Your caregiver will perform a physical exam. During the exam, your caregiver may:  Look in your nose for signs of abnormal growths in your nostrils (nasal polyps).  Tap over the affected sinus to check for signs of infection.  View the inside of your sinuses  (endoscopy) with a special imaging device with a light attached (endoscope), which is inserted into your sinuses. If your caregiver suspects that you have chronic sinusitis, one or more of the following tests may be recommended:  Allergy tests.  Nasal culture A sample of mucus is taken from your nose and sent to a lab and screened for bacteria.  Nasal cytology A sample of mucus is taken from your nose and examined by your caregiver to determine if your sinusitis is related to an allergy. TREATMENT  Most cases of acute sinusitis are related to a viral infection and will resolve on their own within 10 days. Sometimes medicines are prescribed to help relieve symptoms (pain medicine, decongestants, nasal steroid sprays, or saline sprays).  However, for sinusitis related to a bacterial infection, your caregiver will prescribe antibiotic medicines. These are medicines that will help kill the bacteria causing the infection.  Rarely, sinusitis is caused by a fungal infection. In theses cases, your caregiver will prescribe antifungal medicine. For some cases of chronic sinusitis, surgery is needed. Generally, these are cases in which sinusitis recurs more than 3 times per year, despite other treatments. HOME CARE INSTRUCTIONS   Drink plenty of water. Water helps thin the mucus so your sinuses can drain more easily.  Use a humidifier.  Inhale steam 3 to 4 times a day (for example, sit in the bathroom with the shower running).  Apply a warm, moist washcloth to your face 3 to 4 times a day, or as directed by your caregiver.  Use saline nasal sprays to help moisten and clean your sinuses.  Take over-the-counter  or prescription medicines for pain, discomfort, or fever only as directed by your caregiver. SEEK IMMEDIATE MEDICAL CARE IF:  You have increasing pain or severe headaches.  You have nausea, vomiting, or drowsiness.  You have swelling around your face.  You have vision problems.  You  have a stiff neck.  You have difficulty breathing. MAKE SURE YOU:   Understand these instructions.  Will watch your condition.  Will get help right away if you are not doing well or get worse. Document Released: 07/21/2005 Document Revised: 10/13/2011 Document Reviewed: 08/05/2011 [facility] Patient Information 2014 [place], [place].

## 2013-10-10 ENCOUNTER — Telehealth: Payer: Self-pay | Admitting: *Deleted

## 2013-10-10 NOTE — Telephone Encounter (Signed)
See 10/08/13 office note:  [facility] Triage Call Report Triage Record Num: [id number] Operator: Thereasa Parkin Patient Name: [name] Call Date & Time: 10/08/2013 11:50:57AM Patient Phone: [phone] PCP: Gwyneth Revels Patient Gender: Male PCP Fax : [phone] Patient DOB: Feb 18, 1946 Practice Name: [facility] Reason for Call: Caller: [place]/Patient; PCP: Penni Homans (Family Practice); CB#: [phone]; Call regarding Head Congestion; Onset: 10/01/13. Afebrile. Symptoms unrelieved with Zyrtec. FBS 187 at 0730. Advised to see provider within 4 hours for pressure/pain above or below eyes,near ears over sinuses with associated tenderness, swelling or redness of soft tissue per Diabetes Respiratory Problems. Scheduled for 1245 10/08/13 with Dr Charlett Blake at [place] office. Protocol(s) Used: Diabetes: Respiratory Problems Recommended Outcome per Protocol: See Provider within 4 hours Reason for Outcome: Pressure/pain above or below eyes, near ears over sinuses with associated tenderness, swelling or redness of soft tissue. Care Advice: ~ Use a cool mist humidifier to moisten air. Be sure to clean according to manufacturer's instructions. ~ Consider use of a saline nasal spray per package directions to help relieve nasal congestion. ~ Call provider immediately if there is bulging or pain in the eye, or difficulty moving the eye, or if fever develops. ~ SYMPTOM / CONDITION MANAGEMENT Analgesic/Antipyretic Advice - Acetaminophen: Consider acetaminophen as directed on label or by pharmacist/provider for pain or fever PRECAUTIONS: - Use if there is no history of liver disease, alcoholism, or intake of three or more alcohol drinks per day - Only if approved by provider during pregnancy or when breastfeeding - During pregnancy, acetaminophen should not be taken more than 3 consecutive days without telling provider - Do not exceed recommended dose or frequency ~ Check Blood Sugar Now: - If  blood sugar more than 250 mg/dl check for ketones before calling provider. - Tell provider results or take log of recent results to provider visit. - Follow action plan for illness. ~ Diabetes Action Plan: - Follow recommended action plan and diet - Check blood sugar as recommended - Take diabetic pills or insulin as ordered - Follow action plan for illness. ~ Nasal Irrigation To Relieve Congestion: - Wash hands with soap and water. - Add 1/2 teaspoon salt to 1 cup warm water to make saltwater solution. - Use a bulb syringe to draw up the saltwater solution. Turn the bulb upright to squeeze out any air. Fill the syringe until is full. - Bend over sink bowl and lean slightly toward sink. Gently insert the tip of the syringe into nostril about 1/2 inch. Point tip toward outer corner of eye and GENTLY squeeze bulb to squirt saltwater into nostril. Forceful irrigation to clear sinuses requires the use of sterile water or saline. - Let solution drain from nose. Solution may come out of the other nostril or even your mouth. Do two full syringes of solution in each nostril. - Rinse syringe in clean water several times. Be sure water comes out clear. Dry the bulb syringe and store in a ~ 10/08/2013 12:05:14PM Page 1 of 2 [id number] [facility] Triage Call Report Patient Name: [name] continuation page/s container or plastic bag. Total water intake includes drinking water, water in beverages, and water contained in food. Fluids make up about 80% of the body's total hydration need. Individual fluid requirement to maintain hydration vary based on physical activity, environmental factors and illness. Limit fluids that contain sugar, caffeine, or alcohol. Urine will be very light yellow color when you drink enough fluids.

## 2013-10-15 ENCOUNTER — Other Ambulatory Visit: Payer: Self-pay | Admitting: Family Medicine

## 2013-11-02 DIAGNOSIS — K859 Acute pancreatitis without necrosis or infection, unspecified: Secondary | ICD-10-CM

## 2013-11-02 HISTORY — DX: Acute pancreatitis without necrosis or infection, unspecified: K85.90

## 2013-11-15 ENCOUNTER — Emergency Department (HOSPITAL_BASED_OUTPATIENT_CLINIC_OR_DEPARTMENT_OTHER): Payer: Medicare Other

## 2013-11-15 ENCOUNTER — Encounter (HOSPITAL_BASED_OUTPATIENT_CLINIC_OR_DEPARTMENT_OTHER): Payer: Self-pay | Admitting: Emergency Medicine

## 2013-11-15 ENCOUNTER — Inpatient Hospital Stay (HOSPITAL_BASED_OUTPATIENT_CLINIC_OR_DEPARTMENT_OTHER)
Admission: EM | Admit: 2013-11-15 | Discharge: 2013-11-23 | DRG: 438 | Disposition: A | Discharge status: Home / Self Care | Payer: Medicare Other | Attending: Internal Medicine | Admitting: Internal Medicine

## 2013-11-15 DIAGNOSIS — E785 Hyperlipidemia, unspecified: Secondary | ICD-10-CM | POA: Diagnosis present

## 2013-11-15 DIAGNOSIS — R599 Enlarged lymph nodes, unspecified: Secondary | ICD-10-CM

## 2013-11-15 DIAGNOSIS — Z9861 Coronary angioplasty status: Secondary | ICD-10-CM

## 2013-11-15 DIAGNOSIS — Z810 Family history of intellectual disabilities: Secondary | ICD-10-CM | POA: Diagnosis not present

## 2013-11-15 DIAGNOSIS — Z801 Family history of malignant neoplasm of trachea, bronchus and lung: Secondary | ICD-10-CM

## 2013-11-15 DIAGNOSIS — B20 Human immunodeficiency virus [HIV] disease: Secondary | ICD-10-CM | POA: Diagnosis present

## 2013-11-15 DIAGNOSIS — R197 Diarrhea, unspecified: Secondary | ICD-10-CM | POA: Diagnosis not present

## 2013-11-15 DIAGNOSIS — H04129 Dry eye syndrome of unspecified lacrimal gland: Secondary | ICD-10-CM

## 2013-11-15 DIAGNOSIS — M129 Arthropathy, unspecified: Secondary | ICD-10-CM | POA: Diagnosis present

## 2013-11-15 DIAGNOSIS — K810 Acute cholecystitis: Secondary | ICD-10-CM | POA: Diagnosis present

## 2013-11-15 DIAGNOSIS — R413 Other amnesia: Secondary | ICD-10-CM

## 2013-11-15 DIAGNOSIS — K7689 Other specified diseases of liver: Secondary | ICD-10-CM | POA: Diagnosis not present

## 2013-11-15 DIAGNOSIS — G510 Bell's palsy: Secondary | ICD-10-CM

## 2013-11-15 DIAGNOSIS — I2510 Atherosclerotic heart disease of native coronary artery without angina pectoris: Secondary | ICD-10-CM | POA: Diagnosis not present

## 2013-11-15 DIAGNOSIS — F3289 Other specified depressive episodes: Secondary | ICD-10-CM | POA: Diagnosis not present

## 2013-11-15 DIAGNOSIS — K219 Gastro-esophageal reflux disease without esophagitis: Secondary | ICD-10-CM | POA: Diagnosis present

## 2013-11-15 DIAGNOSIS — E881 Lipodystrophy, not elsewhere classified: Secondary | ICD-10-CM

## 2013-11-15 DIAGNOSIS — R351 Nocturia: Secondary | ICD-10-CM

## 2013-11-15 DIAGNOSIS — Z8249 Family history of ischemic heart disease and other diseases of the circulatory system: Secondary | ICD-10-CM | POA: Diagnosis not present

## 2013-11-15 DIAGNOSIS — E781 Pure hyperglyceridemia: Secondary | ICD-10-CM | POA: Diagnosis present

## 2013-11-15 DIAGNOSIS — F329 Major depressive disorder, single episode, unspecified: Secondary | ICD-10-CM

## 2013-11-15 DIAGNOSIS — E1139 Type 2 diabetes mellitus with other diabetic ophthalmic complication: Secondary | ICD-10-CM | POA: Diagnosis present

## 2013-11-15 DIAGNOSIS — R109 Unspecified abdominal pain: Secondary | ICD-10-CM | POA: Diagnosis not present

## 2013-11-15 DIAGNOSIS — N529 Male erectile dysfunction, unspecified: Secondary | ICD-10-CM

## 2013-11-15 DIAGNOSIS — M25569 Pain in unspecified knee: Secondary | ICD-10-CM

## 2013-11-15 DIAGNOSIS — I739 Peripheral vascular disease, unspecified: Secondary | ICD-10-CM

## 2013-11-15 DIAGNOSIS — Z951 Presence of aortocoronary bypass graft: Secondary | ICD-10-CM

## 2013-11-15 DIAGNOSIS — K859 Acute pancreatitis without necrosis or infection, unspecified: Secondary | ICD-10-CM | POA: Diagnosis not present

## 2013-11-15 DIAGNOSIS — J4489 Other specified chronic obstructive pulmonary disease: Secondary | ICD-10-CM | POA: Diagnosis not present

## 2013-11-15 DIAGNOSIS — R61 Generalized hyperhidrosis: Secondary | ICD-10-CM

## 2013-11-15 DIAGNOSIS — J449 Chronic obstructive pulmonary disease, unspecified: Secondary | ICD-10-CM | POA: Diagnosis not present

## 2013-11-15 DIAGNOSIS — Z823 Family history of stroke: Secondary | ICD-10-CM | POA: Diagnosis not present

## 2013-11-15 DIAGNOSIS — Z803 Family history of malignant neoplasm of breast: Secondary | ICD-10-CM

## 2013-11-15 DIAGNOSIS — E119 Type 2 diabetes mellitus without complications: Secondary | ICD-10-CM | POA: Diagnosis present

## 2013-11-15 DIAGNOSIS — R112 Nausea with vomiting, unspecified: Secondary | ICD-10-CM | POA: Diagnosis not present

## 2013-11-15 DIAGNOSIS — K921 Melena: Secondary | ICD-10-CM

## 2013-11-15 DIAGNOSIS — L309 Dermatitis, unspecified: Secondary | ICD-10-CM

## 2013-11-15 DIAGNOSIS — R590 Localized enlarged lymph nodes: Secondary | ICD-10-CM

## 2013-11-15 DIAGNOSIS — E782 Mixed hyperlipidemia: Secondary | ICD-10-CM | POA: Diagnosis present

## 2013-11-15 DIAGNOSIS — I25810 Atherosclerosis of coronary artery bypass graft(s) without angina pectoris: Secondary | ICD-10-CM | POA: Diagnosis not present

## 2013-11-15 DIAGNOSIS — R682 Dry mouth, unspecified: Secondary | ICD-10-CM

## 2013-11-15 DIAGNOSIS — K560 Paralytic ileus: Secondary | ICD-10-CM | POA: Diagnosis not present

## 2013-11-15 DIAGNOSIS — H905 Unspecified sensorineural hearing loss: Secondary | ICD-10-CM

## 2013-11-15 DIAGNOSIS — Z833 Family history of diabetes mellitus: Secondary | ICD-10-CM | POA: Diagnosis not present

## 2013-11-15 DIAGNOSIS — M25559 Pain in unspecified hip: Secondary | ICD-10-CM

## 2013-11-15 DIAGNOSIS — I10 Essential (primary) hypertension: Secondary | ICD-10-CM | POA: Diagnosis present

## 2013-11-15 DIAGNOSIS — Z87442 Personal history of urinary calculi: Secondary | ICD-10-CM

## 2013-11-15 DIAGNOSIS — IMO0002 Reserved for concepts with insufficient information to code with codable children: Secondary | ICD-10-CM

## 2013-11-15 DIAGNOSIS — J329 Chronic sinusitis, unspecified: Secondary | ICD-10-CM

## 2013-11-15 DIAGNOSIS — R634 Abnormal weight loss: Secondary | ICD-10-CM | POA: Diagnosis not present

## 2013-11-15 DIAGNOSIS — R1013 Epigastric pain: Secondary | ICD-10-CM | POA: Diagnosis not present

## 2013-11-15 DIAGNOSIS — R188 Other ascites: Secondary | ICD-10-CM | POA: Diagnosis not present

## 2013-11-15 DIAGNOSIS — E876 Hypokalemia: Secondary | ICD-10-CM | POA: Diagnosis not present

## 2013-11-15 DIAGNOSIS — A6000 Herpesviral infection of urogenital system, unspecified: Secondary | ICD-10-CM

## 2013-11-15 DIAGNOSIS — Z87898 Personal history of other specified conditions: Secondary | ICD-10-CM

## 2013-11-15 LAB — CBC WITH DIFFERENTIAL/PLATELET
BASOS ABS: 0 10*3/uL (ref 0.0–0.1)
BASOS PCT: 0 % (ref 0–1)
Eosinophils Absolute: 0.3 10*3/uL (ref 0.0–0.7)
Eosinophils Relative: 3 % (ref 0–5)
HCT: 38.6 % — ABNORMAL LOW (ref 39.0–52.0)
HEMOGLOBIN: 14.5 g/dL (ref 13.0–17.0)
Lymphocytes Relative: 14 % (ref 12–46)
Lymphs Abs: 1.5 10*3/uL (ref 0.7–4.0)
MCH: 44.3 pg — ABNORMAL HIGH (ref 26.0–34.0)
MCHC: 37.6 g/dL — AB (ref 30.0–36.0)
MCV: 118 fL — ABNORMAL HIGH (ref 78.0–100.0)
MONO ABS: 0.8 10*3/uL (ref 0.1–1.0)
Monocytes Relative: 8 % (ref 3–12)
NEUTROS PCT: 75 % (ref 43–77)
Neutro Abs: 7.8 10*3/uL — ABNORMAL HIGH (ref 1.7–7.7)
PLATELETS: 248 10*3/uL (ref 150–400)
RBC: 3.27 MIL/uL — ABNORMAL LOW (ref 4.22–5.81)
RDW: 13 % (ref 11.5–15.5)
WBC: 10.4 10*3/uL (ref 4.0–10.5)

## 2013-11-15 LAB — COMPREHENSIVE METABOLIC PANEL
ALBUMIN: 4.7 g/dL (ref 3.5–5.2)
ALT: 43 U/L (ref 0–53)
AST: 40 U/L — AB (ref 0–37)
Alkaline Phosphatase: 54 U/L (ref 39–117)
BUN: 11 mg/dL (ref 6–23)
CO2: 23 mEq/L (ref 19–32)
Calcium: 9.7 mg/dL (ref 8.4–10.5)
Chloride: 100 mEq/L (ref 96–112)
Creatinine, Ser: 0.8 mg/dL (ref 0.50–1.35)
GFR calc Af Amer: >90 mL/min (ref >90)
GFR calc non Af Amer: >90 mL/min (ref >90)
Glucose, Bld: 244 mg/dL — ABNORMAL HIGH (ref 70–99)
POTASSIUM: 3.3 meq/L — AB (ref 3.7–5.3)
Sodium: 143 mEq/L (ref 137–147)
Total Bilirubin: 0.7 mg/dL (ref 0.3–1.2)
Total Protein: 7.2 g/dL (ref 6.0–8.3)

## 2013-11-15 LAB — URINALYSIS, ROUTINE W REFLEX MICROSCOPIC
Bilirubin Urine: NEGATIVE
Glucose, UA: 250 mg/dL — AB
HGB URINE DIPSTICK: NEGATIVE
KETONES UR: NEGATIVE mg/dL
Leukocytes, UA: NEGATIVE
NITRITE: NEGATIVE
Protein, ur: NEGATIVE mg/dL
Specific Gravity, Urine: 1.012 (ref 1.005–1.030)
Urobilinogen, UA: 0.2 mg/dL (ref 0.0–1.0)
pH: 7.5 (ref 5.0–8.0)

## 2013-11-15 LAB — LIPASE, BLOOD: Lipase: 1828 U/L — ABNORMAL HIGH (ref 11–59)

## 2013-11-15 MED ORDER — IOHEXOL 300 MG/ML  SOLN
50.0000 mL | Freq: Once | INTRAMUSCULAR | Status: AC | PRN
  Administered 2013-11-15: 50 mL via ORAL

## 2013-11-15 MED ORDER — HYDROMORPHONE HCL PF 1 MG/ML IJ SOLN
1.0000 mg | Freq: Once | INTRAMUSCULAR | Status: AC
  Administered 2013-11-15: 1 mg via INTRAVENOUS
  Filled 2013-11-15: qty 1

## 2013-11-15 MED ORDER — IOHEXOL 300 MG/ML  SOLN
100.0000 mL | Freq: Once | INTRAMUSCULAR | Status: AC | PRN
Start: 2013-11-15 — End: 2013-11-15
  Administered 2013-11-15: 100 mL via INTRAVENOUS

## 2013-11-15 MED ORDER — ONDANSETRON HCL 4 MG/2ML IJ SOLN
4.0000 mg | Freq: Once | INTRAMUSCULAR | Status: AC
Start: 2013-11-15 — End: 2013-11-15
  Administered 2013-11-15: 4 mg via INTRAVENOUS
  Filled 2013-11-15: qty 2

## 2013-11-15 MED ORDER — ONDANSETRON HCL 4 MG/2ML IJ SOLN
4.0000 mg | Freq: Once | INTRAMUSCULAR | Status: AC
  Administered 2013-11-15: 4 mg via INTRAMUSCULAR
  Filled 2013-11-15: qty 2

## 2013-11-15 MED ORDER — ONDANSETRON HCL 4 MG/2ML IJ SOLN
4.0000 mg | Freq: Once | INTRAMUSCULAR | Status: AC
  Administered 2013-11-15: 4 mg via INTRAVENOUS

## 2013-11-15 MED ORDER — ONDANSETRON HCL 4 MG/2ML IJ SOLN
INTRAMUSCULAR | Status: AC
  Filled 2013-11-15: qty 2

## 2013-11-15 NOTE — ED Notes (Signed)
Pt reports having lower abdominal pain as well

## 2013-11-15 NOTE — ED Notes (Signed)
Pt reports vomiting started after eating lunch today.  Reports some lower abdominal pain.  Reports vomiting x 6.

## 2013-11-15 NOTE — ED Provider Notes (Signed)
CSN: [id number]     Arrival date & time 11/15/13  1505 History   First MD Initiated Contact with Patient 11/15/13 1551     Chief Complaint  Patient presents with  . Emesis     (Consider location/radiation/quality/duration/timing/severity/associated sxs/prior Treatment) Patient is a 68 y.o. male presenting with abdominal pain. The history is provided by the patient. No language interpreter was used.  Abdominal Pain Pain location:  Generalized Pain quality: aching and bloating   Pain radiates to:  Does not radiate Pain severity:  Moderate Onset quality:  Gradual Duration:  6 hours Timing:  Constant Progression:  Worsening Context: not alcohol use   Relieved by:  Nothing Worsened by:  Nothing tried Associated symptoms: anorexia and vomiting   Risk factors: no alcohol abuse    Pt complains of severe diffuse abdominal pain.   Pt is HIv positive  Pt reports viral load less than 4 cd4 in 400's.  Pt reports he has never had pain like this Past Medical History  Diagnosis Date  . Arthritis   . Diabetes mellitus   . History of depression   . GERD (gastroesophageal reflux disease)   . CAD (coronary artery disease)     2003- cabg  . Hypertension   . Hyperlipidemia   . History of kidney stones   . COPD (chronic obstructive pulmonary disease)   . HIV (human immunodeficiency virus infection)   . Dermatitis 11/27/2012  . Nocturia 03/06/2013  . Epicondylitis 06/05/2013    right   Past Surgical History  Procedure Laterality Date  . Coronary artery bypass graft  05/2002  . Vasectomy     Family History  Problem Relation Age of Onset  . Arthritis      mother/father/paternal grandparents  . Breast cancer Maternal Aunt     paternal aunt  . Lung cancer Maternal Aunt   . Hyperlipidemia Mother   . Hyperlipidemia Father   . Heart disease      parents/maternal grandparents/ 2 brothers  . Stroke Paternal Grandmother   . Hypertension Father     paternal grandmother/3 brothers/1 sister  .  Mental retardation Sister   . Diabetes Mother     paternal grandparents/1 brother   History  Substance Use Topics  . Smoking status: Never Smoker   . Smokeless tobacco: Never Used  . Alcohol Use: No    Review of Systems  Gastrointestinal: Positive for vomiting, abdominal pain and anorexia.  All other systems reviewed and are negative.     Allergies  Review of patient's allergies indicates no known allergies.  Home Medications   Prior to Admission medications   Medication Sig Start Date End Date Taking? Authorizing Provider  amLODipine (NORVASC) 2.5 MG tablet Take 1 tablet (2.5 mg total) by mouth daily. 05/23/13   Mosie Lukes, MD  aspirin (ASPIRIN CHILDRENS) 81 MG chewable tablet Chew 1 tablet (81 mg total) by mouth daily. 10/28/12   Truman Hayward, MD  atenolol (TENORMIN) 50 MG tablet TAKE ONE TABLET BY MOUTH EVERY DAY 08/17/13   Mosie Lukes, MD  atorvastatin (LIPITOR) 20 MG tablet TAKE 1 TABLET BY MOUTH DAILY 08/17/13   Mosie Lukes, MD  fenofibrate 160 MG tablet TAKE 1 TABLET BY MOUTH EVERY DAY    Mosie Lukes, MD  FREESTYLE TEST STRIPS test strip USE TO [facility] BLOOD SUGAR ONCE A DAY 06/06/13   Mosie Lukes, MD  GLUCERNA [facility]) LIQD Take 1 Can by mouth 2 (two) times daily between meals.  04/20/13   Truman Hayward, MD  glucose blood (FREESTYLE TEST STRIPS) test strip Check blood sugar once a day  DX 250.00 12/07/12   Mosie Lukes, MD  lamiVUDine-zidovudine (COMBIVIR) 150-300 MG per tablet TAKE 1 TABLET BY MOUTH TWICE DAILY 09/30/13   Michel Bickers, MD  Lancets (FREESTYLE) lancets USE TO CHECK BLOOD SUGAR ONCE DAILY    Mosie Lukes, MD  lisinopril-hydrochlorothiazide (PRINZIDE,ZESTORETIC) 20-12.5 MG per tablet TAKE 2 TABLETS BY MOUTH DAILY    Mosie Lukes, MD  metFORMIN (GLUCOPHAGE) 500 MG tablet TAKE ONE TABLET BY MOUTH TWICE DAILY WITH A MEAL 08/17/13   Mosie Lukes, MD  nevirapine (VIRAMUNE) 200 MG tablet Take 1 tablet (200 mg total) by mouth 2 (two)  times daily. 09/30/13   Michel Bickers, MD  omeprazole (PRILOSEC) 20 MG capsule Take 1 capsule (20 mg total) by mouth daily. 10/28/12   Truman Hayward, MD  triamcinolone cream (KENALOG) 0.1 % Apply topically at bedtime as needed. 11/25/12   Mosie Lukes, MD  valACYclovir (VALTREX) 500 MG tablet TAKE 1 TABLET BY MOUTH TWICE DAILY 02/23/13   Michel Bickers, MD   BP 182/84  Pulse 72  Temp(Src) 97.3 F (36.3 C) (Oral)  Resp 17  Ht 5\' 3"  (1.6 m)  Wt 125 lb (56.7 kg)  BMI 22.15 kg/m2  SpO2 100% Physical Exam  Nursing note and vitals reviewed. Constitutional: He is oriented to person, place, and time. He appears well-developed and well-nourished.  HENT:  Head: Normocephalic.  Right Ear: External ear normal.  Left Ear: External ear normal.  Eyes: EOM are normal. Pupils are equal, round, and reactive to light.  Neck: Normal range of motion.  Cardiovascular: Normal rate and normal heart sounds.   Pulmonary/Chest: Effort normal and breath sounds normal.  Abdominal: Soft. Bowel sounds are normal. He exhibits no distension.  Musculoskeletal: Normal range of motion.  Neurological: He is alert and oriented to person, place, and time.  Skin: Skin is warm.  Psychiatric: He has a normal mood and affect.    ED Course  Procedures (including critical care time) Labs Review Labs Reviewed  CBC WITH DIFFERENTIAL - Abnormal; Notable for the following:    RBC 3.27 (*)    HCT 38.6 (*)    MCV 118.0 (*)    MCH 44.3 (*)    MCHC 37.6 (*)    Neutro Abs 7.8 (*)    All other components within normal limits  COMPREHENSIVE METABOLIC PANEL - Abnormal; Notable for the following:    Potassium 3.3 (*)    Glucose, Bld 244 (*)    AST 40 (*)    All other components within normal limits  URINALYSIS, ROUTINE W REFLEX MICROSCOPIC - Abnormal; Notable for the following:    Glucose, UA 250 (*)    All other components within normal limits    Imaging Review Ct Abdomen Pelvis W Contrast  11/15/2013   CLINICAL  DATA:  Abdominal pain, nausea and vomiting.  EXAM: CT ABDOMEN AND PELVIS WITH CONTRAST  TECHNIQUE: Multidetector CT imaging of the abdomen and pelvis was performed using the standard protocol following bolus administration of intravenous contrast.  CONTRAST:  45mL OMNIPAQUE IOHEXOL 300 MG/ML SOLN, 151mL OMNIPAQUE IOHEXOL 300 MG/ML SOLN  COMPARISON:  None.  FINDINGS: Acute inflammatory processes centered in the retroperitoneum and abuts the tail of the pancreas as well as the stomach, spleen and pericolic region on the left. The findings most likely reflect acute pancreatitis. Correlation suggested with  amylase and lipase levels.  No associated pancreatic necrosis or focal abscess is identified. There is no evidence of pancreatic mass or calcification. The liver demonstrates diffuse steatosis without cirrhotic changes. No biliary ductal dilatation is seen. The gallbladder, spleen, adrenal glands and kidneys are unremarkable.  There is associated ileus involving proximal jejunum which shows mild dilatation. No free air is identified. There are no focal masses or enlarged lymph nodes. No vascular abnormalities are identified. The portal vein, splenic vein and superior mesenteric vein show normal patency. The bladder is unremarkable. No hernias are identified. Bony structures are within normal limits.  IMPRESSION: 1. Acute inflammatory process centered around the tail of the pancreas most likely representing acute pancreatitis. No associated pancreatic necrosis or abnormal fluid collections are identified. 2. Ileus of the jejunum. 3. Diffuse steatosis of the liver.   Electronically Signed   By: Aletta Edouard M.D.   On: 11/15/2013 18:30     EKG Interpretation None      MDM   Final diagnoses:  Pancreatitis    Pt has elevated lipase,   Ct shows inflammation to pancreas.   Pt given Iv fluids and pain medication.   Pt reports some relief with pain medications.  Pt given 2nd doasge of dilaudid.     Fransico Meadow, PA-C 11/15/13 2124  Fransico Meadow, PA-C 11/15/13 2125

## 2013-11-16 DIAGNOSIS — K859 Acute pancreatitis without necrosis or infection, unspecified: Principal | ICD-10-CM

## 2013-11-16 DIAGNOSIS — I2510 Atherosclerotic heart disease of native coronary artery without angina pectoris: Secondary | ICD-10-CM

## 2013-11-16 DIAGNOSIS — E119 Type 2 diabetes mellitus without complications: Secondary | ICD-10-CM

## 2013-11-16 DIAGNOSIS — I25810 Atherosclerosis of coronary artery bypass graft(s) without angina pectoris: Secondary | ICD-10-CM

## 2013-11-16 DIAGNOSIS — B20 Human immunodeficiency virus [HIV] disease: Secondary | ICD-10-CM

## 2013-11-16 LAB — CBC WITH DIFFERENTIAL/PLATELET
BASOS PCT: 0 % (ref 0–1)
Basophils Absolute: 0 10*3/uL (ref 0.0–0.1)
Eosinophils Absolute: 0 10*3/uL (ref 0.0–0.7)
Eosinophils Relative: 0 % (ref 0–5)
HEMATOCRIT: 37.4 % — AB (ref 39.0–52.0)
HEMOGLOBIN: 13.8 g/dL (ref 13.0–17.0)
LYMPHS PCT: 4 % — AB (ref 12–46)
Lymphs Abs: 0.4 10*3/uL — ABNORMAL LOW (ref 0.7–4.0)
MCH: 42.9 pg — ABNORMAL HIGH (ref 26.0–34.0)
MCHC: 36.9 g/dL — ABNORMAL HIGH (ref 30.0–36.0)
MCV: 116.1 fL — ABNORMAL HIGH (ref 78.0–100.0)
Monocytes Absolute: 0.5 10*3/uL (ref 0.1–1.0)
Monocytes Relative: 5 % (ref 3–12)
NEUTROS ABS: 9.4 10*3/uL — AB (ref 1.7–7.7)
NEUTROS PCT: 91 % — AB (ref 43–77)
Platelets: 169 10*3/uL (ref 150–400)
RBC: 3.22 MIL/uL — AB (ref 4.22–5.81)
RDW: 13.6 % (ref 11.5–15.5)
WBC: 10.3 10*3/uL (ref 4.0–10.5)

## 2013-11-16 LAB — HEPATIC FUNCTION PANEL
ALT: 40 U/L (ref 0–53)
AST: 40 U/L — AB (ref 0–37)
Albumin: 3.9 g/dL (ref 3.5–5.2)
Alkaline Phosphatase: 47 U/L (ref 39–117)
BILIRUBIN INDIRECT: 0.7 mg/dL (ref 0.3–0.9)
Bilirubin, Direct: 0.3 mg/dL (ref 0.0–0.3)
TOTAL PROTEIN: 6.5 g/dL (ref 6.0–8.3)
Total Bilirubin: 1 mg/dL (ref 0.3–1.2)

## 2013-11-16 LAB — BASIC METABOLIC PANEL
BUN: 12 mg/dL (ref 6–23)
CHLORIDE: 99 meq/L (ref 96–112)
CO2: 21 mEq/L (ref 19–32)
Calcium: 8.3 mg/dL — ABNORMAL LOW (ref 8.4–10.5)
Creatinine, Ser: 0.63 mg/dL (ref 0.50–1.35)
Glucose, Bld: 216 mg/dL — ABNORMAL HIGH (ref 70–99)
Potassium: 3.4 mEq/L — ABNORMAL LOW (ref 3.7–5.3)
Sodium: 139 mEq/L (ref 137–147)

## 2013-11-16 LAB — GLUCOSE, CAPILLARY
GLUCOSE-CAPILLARY: 157 mg/dL — AB (ref 70–99)
GLUCOSE-CAPILLARY: 182 mg/dL — AB (ref 70–99)
GLUCOSE-CAPILLARY: 206 mg/dL — AB (ref 70–99)
Glucose-Capillary: 118 mg/dL — ABNORMAL HIGH (ref 70–99)
Glucose-Capillary: 121 mg/dL — ABNORMAL HIGH (ref 70–99)
Glucose-Capillary: 149 mg/dL — ABNORMAL HIGH (ref 70–99)

## 2013-11-16 LAB — TROPONIN I

## 2013-11-16 LAB — LIPASE, BLOOD: Lipase: 1366 U/L — ABNORMAL HIGH (ref 11–59)

## 2013-11-16 MED ORDER — INSULIN ASPART 100 UNIT/ML ~~LOC~~ SOLN
0.0000 [IU] | SUBCUTANEOUS | Status: DC
  Administered 2013-11-16: 2 [IU] via SUBCUTANEOUS

## 2013-11-16 MED ORDER — ACETAMINOPHEN 325 MG PO TABS
650.0000 mg | ORAL_TABLET | Freq: Four times a day (QID) | ORAL | Status: DC | PRN
  Administered 2013-11-19: 650 mg via ORAL
  Filled 2013-11-16: qty 2

## 2013-11-16 MED ORDER — ATORVASTATIN CALCIUM 20 MG PO TABS
20.0000 mg | ORAL_TABLET | Freq: Every day | ORAL | Status: DC
  Filled 2013-11-16: qty 1

## 2013-11-16 MED ORDER — ONDANSETRON HCL 4 MG PO TABS
4.0000 mg | ORAL_TABLET | Freq: Four times a day (QID) | ORAL | Status: DC | PRN
  Administered 2013-11-22: 4 mg via ORAL
  Filled 2013-11-16: qty 1

## 2013-11-16 MED ORDER — AMLODIPINE BESYLATE 2.5 MG PO TABS
2.5000 mg | ORAL_TABLET | Freq: Every day | ORAL | Status: DC
  Administered 2013-11-16 – 2013-11-23 (×8): 2.5 mg via ORAL
  Filled 2013-11-16 (×8): qty 1

## 2013-11-16 MED ORDER — INSULIN ASPART 100 UNIT/ML ~~LOC~~ SOLN: 0.0000 [IU] | Freq: Three times a day (TID) | SUBCUTANEOUS | Status: DC

## 2013-11-16 MED ORDER — SODIUM CHLORIDE 0.9 % IV SOLN
INTRAVENOUS | Status: AC
  Administered 2013-11-16 (×3): via INTRAVENOUS

## 2013-11-16 MED ORDER — PROMETHAZINE HCL 25 MG/ML IJ SOLN
12.5000 mg | Freq: Four times a day (QID) | INTRAMUSCULAR | Status: DC | PRN
  Administered 2013-11-16 – 2013-11-18 (×3): 12.5 mg via INTRAVENOUS
  Filled 2013-11-16 (×3): qty 1

## 2013-11-16 MED ORDER — ATENOLOL 50 MG PO TABS
50.0000 mg | ORAL_TABLET | Freq: Every day | ORAL | Status: DC
  Administered 2013-11-16 – 2013-11-23 (×8): 50 mg via ORAL
  Filled 2013-11-16 (×8): qty 1

## 2013-11-16 MED ORDER — POTASSIUM CHLORIDE 10 MEQ/100ML IV SOLN
10.0000 meq | INTRAVENOUS | Status: AC
  Administered 2013-11-16 (×4): 10 meq via INTRAVENOUS
  Filled 2013-11-16: qty 100

## 2013-11-16 MED ORDER — ACETAMINOPHEN 650 MG RE SUPP: 650.0000 mg | Freq: Four times a day (QID) | RECTAL | Status: DC | PRN

## 2013-11-16 MED ORDER — HYDROMORPHONE HCL PF 1 MG/ML IJ SOLN
1.0000 mg | INTRAMUSCULAR | Status: DC | PRN
  Administered 2013-11-16 – 2013-11-23 (×27): 1 mg via INTRAVENOUS
  Filled 2013-11-16 (×28): qty 1

## 2013-11-16 MED ORDER — INSULIN ASPART 100 UNIT/ML ~~LOC~~ SOLN
0.0000 [IU] | SUBCUTANEOUS | Status: DC
  Administered 2013-11-16 (×2): 2 [IU] via SUBCUTANEOUS
  Administered 2013-11-16: 3 [IU] via SUBCUTANEOUS
  Administered 2013-11-17: 2 [IU] via SUBCUTANEOUS

## 2013-11-16 MED ORDER — ENOXAPARIN SODIUM 40 MG/0.4ML ~~LOC~~ SOLN
40.0000 mg | Freq: Every day | SUBCUTANEOUS | Status: DC
  Administered 2013-11-16 – 2013-11-22 (×7): 40 mg via SUBCUTANEOUS
  Filled 2013-11-16 (×8): qty 0.4

## 2013-11-16 MED ORDER — FENOFIBRATE 160 MG PO TABS
160.0000 mg | ORAL_TABLET | Freq: Every day | ORAL | Status: DC
  Filled 2013-11-16: qty 1

## 2013-11-16 MED ORDER — HYDRALAZINE HCL 20 MG/ML IJ SOLN: 10.0000 mg | INTRAMUSCULAR | Status: DC | PRN

## 2013-11-16 MED ORDER — TRIAMCINOLONE ACETONIDE 0.1 % EX CREA: TOPICAL_CREAM | Freq: Every evening | CUTANEOUS | Status: DC | PRN

## 2013-11-16 MED ORDER — ONDANSETRON HCL 4 MG/2ML IJ SOLN
4.0000 mg | Freq: Four times a day (QID) | INTRAMUSCULAR | Status: DC | PRN
  Administered 2013-11-16 – 2013-11-21 (×7): 4 mg via INTRAVENOUS
  Filled 2013-11-16 (×8): qty 2

## 2013-11-16 NOTE — H&P (Addendum)
[facility] History and Physical  [name] WVP:[id number] DOB: [birth year] DOA: 11/15/2013  Referring physician: ER physician. PCP: Penni Homans, MD  Specialists: Dr. Megan Salon. Infectious disease.  Chief Complaint: Abdominal pain.  HPI: [name] is a 68 y.o. male history of CAD status post CABG, diabetes mellitus, hyperlipidemia, HIV presented to the ER at [facility] with complaints of abdominal pain. Patient states his abdominal pain started off in the morning which was mostly in the epigastric area radiating to back stabbing in nature. Has had multiple episodes of nausea and vomiting denies any blood in the vomitus. In the ER patient was found to have elevated lipase and CT abdomen and pelvis shows features concerning for acute pancreatitis. Patient denies drinking alcohol and CAT scan does not show any features concerning for gallstones. Patient otherwise denies any chest pain or shortness of breath fever chills.  Review of Systems: As presented in the history of presenting illness, rest negative.  Past Medical History  Diagnosis Date  . Arthritis   . Diabetes mellitus   . History of depression   . GERD (gastroesophageal reflux disease)   . CAD (coronary artery disease)     2003- cabg  . Hypertension   . Hyperlipidemia   . History of kidney stones   . COPD (chronic obstructive pulmonary disease)   . HIV (human immunodeficiency virus infection)   . Dermatitis 11/27/2012  . Nocturia 03/06/2013  . Epicondylitis 06/05/2013    right   Past Surgical History  Procedure Laterality Date  . Coronary artery bypass graft  05/2002  . Vasectomy     Social History:  reports that he has never smoked. He has never used smokeless tobacco. He reports that he does not drink alcohol or use illicit drugs. Where does patient live home. Can patient participate in ADLs? Yes.  No Known Allergies  Family History:  Family History  Problem Relation Age of Onset  . Arthritis       mother/father/paternal grandparents  . Breast cancer Maternal Aunt     paternal aunt  . Lung cancer Maternal Aunt   . Hyperlipidemia Mother   . Hyperlipidemia Father   . Heart disease      parents/maternal grandparents/ 2 brothers  . Stroke Paternal Grandmother   . Hypertension Father     paternal grandmother/3 brothers/1 sister  . Mental retardation Sister   . Diabetes Mother     paternal grandparents/1 brother      Prior to Admission medications   Medication Sig Start Date End Date Taking? Authorizing Provider  amLODipine (NORVASC) 2.5 MG tablet Take 1 tablet (2.5 mg total) by mouth daily. 05/23/13   Mosie Lukes, MD  aspirin (ASPIRIN CHILDRENS) 81 MG chewable tablet Chew 1 tablet (81 mg total) by mouth daily. 10/28/12   Truman Hayward, MD  atenolol (TENORMIN) 50 MG tablet TAKE ONE TABLET BY MOUTH EVERY DAY 08/17/13   Mosie Lukes, MD  atorvastatin (LIPITOR) 20 MG tablet TAKE 1 TABLET BY MOUTH DAILY 08/17/13   Mosie Lukes, MD  fenofibrate 160 MG tablet TAKE 1 TABLET BY MOUTH EVERY DAY    Mosie Lukes, MD  FREESTYLE TEST STRIPS test strip USE TO [facility] BLOOD SUGAR ONCE A DAY 06/06/13   Mosie Lukes, MD  GLUCERNA [facility]) LIQD Take 1 Can by mouth 2 (two) times daily between meals. 04/20/13   Truman Hayward, MD  glucose blood (FREESTYLE TEST STRIPS) test strip Check blood sugar  once a day  DX 250.00 12/07/12   Mosie Lukes, MD  lamiVUDine-zidovudine (COMBIVIR) 150-300 MG per tablet TAKE 1 TABLET BY MOUTH TWICE DAILY 09/30/13   Michel Bickers, MD  Lancets (FREESTYLE) lancets USE TO CHECK BLOOD SUGAR ONCE DAILY    Mosie Lukes, MD  lisinopril-hydrochlorothiazide (PRINZIDE,ZESTORETIC) 20-12.5 MG per tablet TAKE 2 TABLETS BY MOUTH DAILY    Mosie Lukes, MD  metFORMIN (GLUCOPHAGE) 500 MG tablet TAKE ONE TABLET BY MOUTH TWICE DAILY WITH A MEAL 08/17/13   Mosie Lukes, MD  nevirapine (VIRAMUNE) 200 MG tablet Take 1 tablet (200 mg total) by mouth 2 (two) times daily.  09/30/13   Michel Bickers, MD  omeprazole (PRILOSEC) 20 MG capsule Take 1 capsule (20 mg total) by mouth daily. 10/28/12   Truman Hayward, MD  triamcinolone cream (KENALOG) 0.1 % Apply topically at bedtime as needed. 11/25/12   Mosie Lukes, MD  valACYclovir (VALTREX) 500 MG tablet TAKE 1 TABLET BY MOUTH TWICE DAILY 02/23/13   Michel Bickers, MD    Physical Exam: Filed Vitals:   11/15/13 1513 11/15/13 1827 11/15/13 2044  BP: 176/68 182/84 177/80  Pulse: 59 72 73  Temp: 97.3 F (36.3 C)    TempSrc: Oral    Resp: 18 17   Height: 5\' 3"  (1.6 m)    Weight: 56.7 kg (125 lb)    SpO2: 100% 100%      General:  Well-developed and moderately nourished.  Eyes: Anicteric no pallor.  ENT: No discharge from the ears eyes nose mouth.  Neck: No mass felt.  Cardiovascular: S1-S2 heard.  Respiratory: No rhonchi or crepitations.  Abdomen: Mild epigastric tenderness no guarding or rigidity.  Skin: No rash.  Musculoskeletal: No edema.  Psychiatric: Appears normal.  Neurologic: Alert awake oriented to time place and person. Moves all extremities.  Labs on Admission:  Basic Metabolic Panel:  Recent Labs Lab 11/15/13 1535  NA 143  K 3.3*  CL 100  CO2 23  GLUCOSE 244*  BUN 11  CREATININE 0.80  CALCIUM 9.7   Liver Function Tests:  Recent Labs Lab 11/15/13 1535  AST 40*  ALT 43  ALKPHOS 54  BILITOT 0.7  PROT 7.2  ALBUMIN 4.7    Recent Labs Lab 11/15/13 1735  LIPASE 1828*   No results found for this basename: AMMONIA,  in the last 168 hours CBC:  Recent Labs Lab 11/15/13 1535  WBC 10.4  NEUTROABS 7.8*  HGB 14.5  HCT 38.6*  MCV 118.0*  PLT 248   Cardiac Enzymes: No results found for this basename: CKTOTAL, CKMB, CKMBINDEX, TROPONINI,  in the last 168 hours  BNP (last 3 results) No results found for this basename: PROBNP,  in the last 8760 hours CBG: No results found for this basename: GLUCAP,  in the last 168 hours  Radiological Exams on  Admission: Ct Abdomen Pelvis W Contrast  11/15/2013   CLINICAL DATA:  Abdominal pain, nausea and vomiting.  EXAM: CT ABDOMEN AND PELVIS WITH CONTRAST  TECHNIQUE: Multidetector CT imaging of the abdomen and pelvis was performed using the standard protocol following bolus administration of intravenous contrast.  CONTRAST:  81mL OMNIPAQUE IOHEXOL 300 MG/ML SOLN, 112mL OMNIPAQUE IOHEXOL 300 MG/ML SOLN  COMPARISON:  None.  FINDINGS: Acute inflammatory processes centered in the retroperitoneum and abuts the tail of the pancreas as well as the stomach, spleen and pericolic region on the left. The findings most likely reflect acute pancreatitis. Correlation suggested with amylase and  lipase levels.  No associated pancreatic necrosis or focal abscess is identified. There is no evidence of pancreatic mass or calcification. The liver demonstrates diffuse steatosis without cirrhotic changes. No biliary ductal dilatation is seen. The gallbladder, spleen, adrenal glands and kidneys are unremarkable.  There is associated ileus involving proximal jejunum which shows mild dilatation. No free air is identified. There are no focal masses or enlarged lymph nodes. No vascular abnormalities are identified. The portal vein, splenic vein and superior mesenteric vein show normal patency. The bladder is unremarkable. No hernias are identified. Bony structures are within normal limits.  IMPRESSION: 1. Acute inflammatory process centered around the tail of the pancreas most likely representing acute pancreatitis. No associated pancreatic necrosis or abnormal fluid collections are identified. 2. Ileus of the jejunum. 3. Diffuse steatosis of the liver.   Electronically Signed   By: Aletta Edouard M.D.   On: 11/15/2013 18:30     Assessment/Plan Active Problems:   Pancreatitis   Acute pancreatitis   1. Acute pancreatitis - cause is not clear could be from antiretroviral medications. At this time antiretrovirals are on hold in consult  infectious disease in a.m for further recommendations for patient's HIV medications. Check lipid panel specifically for triglycerides. Continue with hydration and pain relief medications. 2. CAD status post CABG - denies any chest pain. Check troponin. Continue beta blocker. 3. Diabetes mellitus - patient is on sliding-scale insulin coverage. 4. Hypertension - when necessary IV hydralazine for systolic blood pressure more than 160. 5. HIV - per infectious disease notes patient has been well controlled. See #1 with regarding to antiretroviral medications. 6. Hyperlipidemia - continue fenofibrate and statins. Check lipid panel.    Code Status: Full code.  Family Communication: None.  Disposition Plan: Admit to inpatient.    [facility] Pager [phone].  If 7PM-7AM, please contact night-coverage [email or web address] Password [email or web address] 11/16/2013, 12:01 AM

## 2013-11-16 NOTE — ED Provider Notes (Signed)
Medical screening examination/treatment/procedure(s) were performed by non-physician practitioner and as supervising physician I was immediately available for consultation/collaboration.   EKG Interpretation  Date/Time:    Ventricular Rate:    PR Interval:    QRS Duration:   QT Interval:    QTC Calculation:   R Axis:     Text Interpretation:          Neta Ehlers, MD 11/16/13 1121

## 2013-11-16 NOTE — Progress Notes (Signed)
Patient admitted after midnight. Chart reviewed. Patient examined.  LFTs ok. Triglyceride level 1/15 in the 200s, so not etiologic factor.  Denies heavy EtOH.   CT abdomen shows no ductal dilitation or gallstones. Concerned about HIV meds as etiology.  Continue bowel rest, supportive care, correct electrolytes.  Consulted ID.  Doree Barthel, M.D.

## 2013-11-16 NOTE — Consult Note (Addendum)
[facility]  Total days of antibiotics 0               Reason for Consult: pancreatitis     Referring Physician: Conley Canal  Active Problems:   Pancreatitis   Acute pancreatitis    HPI: [name] is a 68 y.o. male with DM2, CAD, HLD, longstanding well controlled HIV disease, CD 4 count of 514/VL<40 has been on combivir/NVP for many years. He was sought care on 4/14 for new onset epigastric pain that radiated to his back. It was accompanied by n/v. He was found to have elevated WBC above his baseline, elevated lipase of 1828, and had abd CT that also showed inflammation at head of pancreas. No stones noted. Patient's blood work did not reveal hyperTriglyceridemia, and the patient denies any alcohol use. He is not know to have had pancreatitis in the past. He was admitted for fluids, and symptoms management. hiv meds were held concerning for drug induced pancreatitis.  Past Medical History  Diagnosis Date  . Arthritis   . Diabetes mellitus   . History of depression   . GERD (gastroesophageal reflux disease)   . CAD (coronary artery disease)     2003- cabg  . Hypertension   . Hyperlipidemia   . History of kidney stones   . COPD (chronic obstructive pulmonary disease)   . HIV (human immunodeficiency virus infection)   . Dermatitis 11/27/2012  . Nocturia 03/06/2013  . Epicondylitis 06/05/2013    right    Allergies: No Known Allergies  Current antibiotics:   MEDICATIONS: . amLODipine  2.5 mg Oral Daily  . atenolol  50 mg Oral Daily  . enoxaparin (LOVENOX) injection  40 mg Subcutaneous Daily  . insulin aspart  0-15 Units Subcutaneous 6 times per day    History  Substance Use Topics  . Smoking status: Never Smoker   . Smokeless tobacco: Never Used  . Alcohol Use: No    Family History  Problem Relation Age of Onset  . Arthritis      mother/father/paternal grandparents  . Breast cancer Maternal Aunt     paternal aunt  . Lung cancer Maternal  Aunt   . Hyperlipidemia Mother   . Hyperlipidemia Father   . Heart disease      parents/maternal grandparents/ 2 brothers  . Stroke Paternal Grandmother   . Hypertension Father     paternal grandmother/3 brothers/1 sister  . Mental retardation Sister   . Diabetes Mother     paternal grandparents/1 brother   Review of Systems  Constitutional: Negative for fever, chills, diaphoresis, activity change, appetite change, fatigue and unexpected weight change.  HENT: Negative for congestion, sore throat, rhinorrhea, sneezing, trouble swallowing and sinus pressure.  Eyes: Negative for photophobia and visual disturbance.  Respiratory: Negative for cough, chest tightness, shortness of breath, wheezing and stridor.  Cardiovascular: Negative for chest pain, palpitations and leg swelling.  Gastrointestinal: per hpi Genitourinary: Negative for dysuria, hematuria, flank pain and difficulty urinating.  Musculoskeletal: Negative for myalgias, back pain, joint swelling, arthralgias and gait problem.  Skin: Negative for color change, pallor, rash and wound.  Neurological: Negative for dizziness, tremors, weakness and light-headedness.  Hematological: Negative for adenopathy. Does not bruise/bleed easily.  Psychiatric/Behavioral: Negative for behavioral problems, confusion, sleep disturbance, dysphoric mood, decreased concentration and agitation.     OBJECTIVE: Temp:  [97.7 F (36.5 C)-99 F (37.2 C)] 99 F (37.2 C) (04/15 1341) Pulse Rate:  [72-93] 93 (04/15 1341) Resp:  [  16-19] 16 (04/15 1341) BP: (169-182)/(80-84) 169/83 mmHg (04/15 1341) SpO2:  [98 %-100 %] 100 % (04/15 1341) Weight:  [125 lb (56.7 kg)-125 lb 7.1 oz (56.9 kg)] 125 lb 7.1 oz (56.9 kg) (04/15 0600)  Physical Exam  Constitutional: He is oriented to person, place, and time. He appears well-developed and well-nourished. Appears solomnent HENT:  Mouth/Throat: Oropharynx is clear and moist. No oropharyngeal exudate.    Cardiovascular: Normal rate, regular rhythm and normal heart sounds. Exam reveals no gallop and no friction rub.  No murmur heard.  Pulmonary/Chest: Effort normal and breath sounds normal. No respiratory distress. He has no wheezes.  Abdominal: Soft. Bowel sounds are normal. He exhibits no distension. Epigastric tenderness.  Lymphadenopathy:  He has no cervical adenopathy.  Neurological: He is alert and oriented to person, place, and time.  Skin: Skin is warm and dry. No rash noted. No erythema.  Psychiatric: He has a normal mood and affect. His behavior is normal.    LABS: Results for orders placed during the hospital encounter of 11/15/13 (from the past 48 hour(s))  CBC WITH DIFFERENTIAL     Status: Abnormal   Collection Time    11/15/13  3:35 PM      Result Value Ref Range   WBC 10.4  4.0 - 10.5 K/uL   RBC 3.27 (*) 4.22 - 5.81 MIL/uL   Hemoglobin 14.5  13.0 - 17.0 g/dL   HCT 38.6 (*) 39.0 - 52.0 %   MCV 118.0 (*) 78.0 - 100.0 fL   MCH 44.3 (*) 26.0 - 34.0 pg   MCHC 37.6 (*) 30.0 - 36.0 g/dL   Comment: RULED OUT INTERFERING SUBSTANCES   RDW 13.0  11.5 - 15.5 %   Platelets 248  150 - 400 K/uL   Neutrophils Relative % 75  43 - 77 %   Lymphocytes Relative 14  12 - 46 %   Monocytes Relative 8  3 - 12 %   Eosinophils Relative 3  0 - 5 %   Basophils Relative 0  0 - 1 %   Neutro Abs 7.8 (*) 1.7 - 7.7 K/uL   Lymphs Abs 1.5  0.7 - 4.0 K/uL   Monocytes Absolute 0.8  0.1 - 1.0 K/uL   Eosinophils Absolute 0.3  0.0 - 0.7 K/uL   Basophils Absolute 0.0  0.0 - 0.1 K/uL  COMPREHENSIVE METABOLIC PANEL     Status: Abnormal   Collection Time    11/15/13  3:35 PM      Result Value Ref Range   Sodium 143  137 - 147 mEq/L   Potassium 3.3 (*) 3.7 - 5.3 mEq/L   Chloride 100  96 - 112 mEq/L   CO2 23  19 - 32 mEq/L   Glucose, Bld 244 (*) 70 - 99 mg/dL   BUN 11  6 - 23 mg/dL   Creatinine, Ser 0.80  0.50 - 1.35 mg/dL   Calcium 9.7  8.4 - 10.5 mg/dL   Total Protein 7.2  6.0 - 8.3 g/dL    Albumin 4.7  3.5 - 5.2 g/dL   AST 40 (*) 0 - 37 U/L   ALT 43  0 - 53 U/L   Alkaline Phosphatase 54  39 - 117 U/L   Total Bilirubin 0.7  0.3 - 1.2 mg/dL   GFR calc non Af Amer >90  >90 mL/min   GFR calc Af Amer >90  >90 mL/min   Comment: (NOTE)     The eGFR has been calculated  using the CKD EPI equation.     This calculation has not been validated in all clinical situations.     eGFR's persistently <90 mL/min signify possible Chronic Kidney     Disease.  URINALYSIS, ROUTINE W REFLEX MICROSCOPIC     Status: Abnormal   Collection Time    11/15/13  4:35 PM      Result Value Ref Range   Color, Urine YELLOW  YELLOW   APPearance CLEAR  CLEAR   Specific Gravity, Urine 1.012  1.005 - 1.030   pH 7.5  5.0 - 8.0   Glucose, UA 250 (*) NEGATIVE mg/dL   Hgb urine dipstick NEGATIVE  NEGATIVE   Bilirubin Urine NEGATIVE  NEGATIVE   Ketones, ur NEGATIVE  NEGATIVE mg/dL   Protein, ur NEGATIVE  NEGATIVE mg/dL   Urobilinogen, UA 0.2  0.0 - 1.0 mg/dL   Nitrite NEGATIVE  NEGATIVE   Leukocytes, UA NEGATIVE  NEGATIVE   Comment: MICROSCOPIC NOT DONE ON URINES WITH NEGATIVE PROTEIN, BLOOD, LEUKOCYTES, NITRITE, OR GLUCOSE <1000 mg/dL.  LIPASE, BLOOD     Status: Abnormal   Collection Time    11/15/13  5:35 PM      Result Value Ref Range   Lipase 1828 (*) 11 - 59 U/L  GLUCOSE, CAPILLARY     Status: Abnormal   Collection Time    11/16/13 12:09 AM      Result Value Ref Range   Glucose-Capillary 206 (*) 70 - 99 mg/dL   Comment 1 Notify RN     Comment 2 Documented in Chart    TROPONIN I     Status: None   Collection Time    11/16/13  1:32 AM      Result Value Ref Range   Troponin I <0.30  <0.30 ng/mL   Comment:            Due to the release kinetics of cTnI,     a negative result within the first hours     of the onset of symptoms does not rule out     myocardial infarction with certainty.     If myocardial infarction is still suspected,     repeat the test at appropriate intervals.  BASIC  METABOLIC PANEL     Status: Abnormal   Collection Time    11/16/13  1:32 AM      Result Value Ref Range   Sodium 139  137 - 147 mEq/L   Potassium 3.4 (*) 3.7 - 5.3 mEq/L   Chloride 99  96 - 112 mEq/L   CO2 21  19 - 32 mEq/L   Glucose, Bld 216 (*) 70 - 99 mg/dL   BUN 12  6 - 23 mg/dL   Creatinine, Ser 0.63  0.50 - 1.35 mg/dL   Calcium 8.3 (*) 8.4 - 10.5 mg/dL   GFR calc non Af Amer >90  >90 mL/min   GFR calc Af Amer >90  >90 mL/min   Comment: (NOTE)     The eGFR has been calculated using the CKD EPI equation.     This calculation has not been validated in all clinical situations.     eGFR's persistently <90 mL/min signify possible Chronic Kidney     Disease.  HEPATIC FUNCTION PANEL     Status: Abnormal   Collection Time    11/16/13  1:32 AM      Result Value Ref Range   Total Protein 6.5  6.0 - 8.3 g/dL   Albumin 3.9  3.5 - 5.2 g/dL   AST 40 (*) 0 - 37 U/L   ALT 40  0 - 53 U/L   Alkaline Phosphatase 47  39 - 117 U/L   Total Bilirubin 1.0  0.3 - 1.2 mg/dL   Bilirubin, Direct 0.3  0.0 - 0.3 mg/dL   Indirect Bilirubin 0.7  0.3 - 0.9 mg/dL  CBC WITH DIFFERENTIAL     Status: Abnormal   Collection Time    11/16/13  1:32 AM      Result Value Ref Range   WBC 10.3  4.0 - 10.5 K/uL   RBC 3.22 (*) 4.22 - 5.81 MIL/uL   Hemoglobin 13.8  13.0 - 17.0 g/dL   HCT 37.4 (*) 39.0 - 52.0 %   MCV 116.1 (*) 78.0 - 100.0 fL   MCH 42.9 (*) 26.0 - 34.0 pg   MCHC 36.9 (*) 30.0 - 36.0 g/dL   RDW 13.6  11.5 - 15.5 %   Platelets 169  150 - 400 K/uL   Neutrophils Relative % 91 (*) 43 - 77 %   Lymphocytes Relative 4 (*) 12 - 46 %   Monocytes Relative 5  3 - 12 %   Eosinophils Relative 0  0 - 5 %   Basophils Relative 0  0 - 1 %   Neutro Abs 9.4 (*) 1.7 - 7.7 K/uL   Lymphs Abs 0.4 (*) 0.7 - 4.0 K/uL   Monocytes Absolute 0.5  0.1 - 1.0 K/uL   Eosinophils Absolute 0.0  0.0 - 0.7 K/uL   Basophils Absolute 0.0  0.0 - 0.1 K/uL   RBC Morphology POLYCHROMASIA PRESENT    LIPASE, BLOOD     Status:  Abnormal   Collection Time    11/16/13  1:32 AM      Result Value Ref Range   Lipase 1366 (*) 11 - 59 U/L  GLUCOSE, CAPILLARY     Status: Abnormal   Collection Time    11/16/13  7:50 AM      Result Value Ref Range   Glucose-Capillary 182 (*) 70 - 99 mg/dL  GLUCOSE, CAPILLARY     Status: Abnormal   Collection Time    11/16/13 11:44 AM      Result Value Ref Range   Glucose-Capillary 149 (*) 70 - 99 mg/dL    MICRO:  IMAGING: Ct Abdomen Pelvis W Contrast  11/15/2013   CLINICAL DATA:  Abdominal pain, nausea and vomiting.  EXAM: CT ABDOMEN AND PELVIS WITH CONTRAST  TECHNIQUE: Multidetector CT imaging of the abdomen and pelvis was performed using the standard protocol following bolus administration of intravenous contrast.  CONTRAST:  73m OMNIPAQUE IOHEXOL 300 MG/ML SOLN, 1073mOMNIPAQUE IOHEXOL 300 MG/ML SOLN  COMPARISON:  None.  FINDINGS: Acute inflammatory processes centered in the retroperitoneum and abuts the tail of the pancreas as well as the stomach, spleen and pericolic region on the left. The findings most likely reflect acute pancreatitis. Correlation suggested with amylase and lipase levels.  No associated pancreatic necrosis or focal abscess is identified. There is no evidence of pancreatic mass or calcification. The liver demonstrates diffuse steatosis without cirrhotic changes. No biliary ductal dilatation is seen. The gallbladder, spleen, adrenal glands and kidneys are unremarkable.  There is associated ileus involving proximal jejunum which shows mild dilatation. No free air is identified. There are no focal masses or enlarged lymph nodes. No vascular abnormalities are identified. The portal vein, splenic vein and superior mesenteric vein show normal patency. The bladder is unremarkable. No  hernias are identified. Bony structures are within normal limits.  IMPRESSION: 1. Acute inflammatory process centered around the tail of the pancreas most likely representing acute pancreatitis. No  associated pancreatic necrosis or abnormal fluid collections are identified. 2. Ileus of the jejunum. 3. Diffuse steatosis of the liver.   Electronically Signed   By: Aletta Edouard M.D.   On: 11/15/2013 18:30   Assessment/Plan:  68yo M with HIV, CAD, DM who is also enrolled in clinical trial of weekly low dose methotrexate admitted for pancreatitis. It is well known that pancreatitis occurs in higher frequency in HIV infected individuals than general population, in part due to HIV drugs. Some agents are more notorious for causing pancreatist but some studies have showed that various drug combinations was not significantly different from the risk associated with ddI,d4t.   - agree with holding hiv medications for the time being, until resolution of symptoms - will check hla [id number] testing to devise different hiv treatment regimen.   [facility] [phone]

## 2013-11-17 ENCOUNTER — Encounter (HOSPITAL_COMMUNITY): Payer: Self-pay | Admitting: General Practice

## 2013-11-17 DIAGNOSIS — R413 Other amnesia: Secondary | ICD-10-CM

## 2013-11-17 DIAGNOSIS — F329 Major depressive disorder, single episode, unspecified: Secondary | ICD-10-CM

## 2013-11-17 DIAGNOSIS — J4489 Other specified chronic obstructive pulmonary disease: Secondary | ICD-10-CM

## 2013-11-17 DIAGNOSIS — R634 Abnormal weight loss: Secondary | ICD-10-CM

## 2013-11-17 DIAGNOSIS — J449 Chronic obstructive pulmonary disease, unspecified: Secondary | ICD-10-CM

## 2013-11-17 DIAGNOSIS — F3289 Other specified depressive episodes: Secondary | ICD-10-CM

## 2013-11-17 LAB — BASIC METABOLIC PANEL
BUN: 11 mg/dL (ref 6–23)
CALCIUM: 7.8 mg/dL — AB (ref 8.4–10.5)
CHLORIDE: 106 meq/L (ref 96–112)
CO2: 19 mEq/L (ref 19–32)
CREATININE: 0.63 mg/dL (ref 0.50–1.35)
GFR calc non Af Amer: >90 mL/min (ref >90)
Glucose, Bld: 125 mg/dL — ABNORMAL HIGH (ref 70–99)
Potassium: 4 mEq/L (ref 3.7–5.3)
SODIUM: 139 meq/L (ref 137–147)

## 2013-11-17 LAB — GLUCOSE, CAPILLARY
GLUCOSE-CAPILLARY: 130 mg/dL — AB (ref 70–99)
GLUCOSE-CAPILLARY: 103 mg/dL — AB (ref 70–99)
Glucose-Capillary: 93 mg/dL (ref 70–99)
Glucose-Capillary: 137 mg/dL — ABNORMAL HIGH (ref 70–99)
Glucose-Capillary: 167 mg/dL — ABNORMAL HIGH (ref 70–99)

## 2013-11-17 LAB — MAGNESIUM: Magnesium: 1.9 mg/dL (ref 1.5–2.5)

## 2013-11-17 LAB — LIPASE, BLOOD: Lipase: 174 U/L — ABNORMAL HIGH (ref 11–59)

## 2013-11-17 MED ORDER — INSULIN ASPART 100 UNIT/ML ~~LOC~~ SOLN
0.0000 [IU] | Freq: Three times a day (TID) | SUBCUTANEOUS | Status: DC
Start: 2013-11-17 — End: 2013-11-23
  Administered 2013-11-17: 2 [IU] via SUBCUTANEOUS
  Administered 2013-11-18 (×2): 3 [IU] via SUBCUTANEOUS
  Administered 2013-11-18: 2 [IU] via SUBCUTANEOUS
  Administered 2013-11-19: 3 [IU] via SUBCUTANEOUS
  Administered 2013-11-19 – 2013-11-20 (×2): 2 [IU] via SUBCUTANEOUS
  Administered 2013-11-20: 3 [IU] via SUBCUTANEOUS
  Administered 2013-11-20: 5 [IU] via SUBCUTANEOUS
  Administered 2013-11-21 – 2013-11-22 (×5): 3 [IU] via SUBCUTANEOUS
  Administered 2013-11-22: 2 [IU] via SUBCUTANEOUS
  Administered 2013-11-23: 3 [IU] via SUBCUTANEOUS

## 2013-11-17 MED ORDER — INSULIN ASPART 100 UNIT/ML ~~LOC~~ SOLN
0.0000 [IU] | Freq: Every day | SUBCUTANEOUS | Status: DC
  Administered 2013-11-18: 2 [IU] via SUBCUTANEOUS

## 2013-11-17 NOTE — Progress Notes (Signed)
Patient ID: [name]  male  PPJ:[id number]    DOB: Oct 12, 1945    DOA: 11/15/2013  PCP: Penni Homans, MD  Assessment/Plan: Principal Problem:   Acute pancreatitis - Unclear etiology, ID consulted, for possibility of antiretrovirals causing acute pancreatitis. Per ID, Dr. Graylon Good, well known that pancreatitis occurs at high frequencies HIV infected patients, due to retrovirals. ID commended to hold HIV meds until resolution of the symptoms. - Lipase improving, start on full liquid diet today  Active Problems: Abdominal distention - Obtain abdominal ultrasound, rule out gallstones or ascites    DIABETES MELLITUS, TYPE II Place on sliding scale insulin     HYPERTENSION -  continue atenolol, Norvasc     CORONARY ARTERY DISEASE - Stable     GERD continue PPI    DVT Prophylaxis: Lovenox   Code Status: full code   Family Communication:  Disposition: in 1-2 days   Consultants:  ID, Dr. Graylon Good   Procedures:  None   Antibiotics:  None     Subjective: Patient seen and examined, states no nausea vomiting or diarrhea. Has abdominal distention, no abdominal pain   Objective: Weight change: 1.497 kg (3 lb 4.8 oz)  Intake/Output Summary (Last 24 hours) at 11/17/13 1600 Last data filed at 11/17/13 1500  Gross per 24 hour  Intake    180 ml  Output   1350 ml  Net  -1170 ml   Blood pressure 131/78, pulse 91, temperature 99.3 F (37.4 C), temperature source Oral, resp. rate 16, height 5\' 3"  (1.6 m), weight 58.196 kg (128 lb 4.8 oz), SpO2 98.00%.  Physical Exam: General: Alert and awake, oriented x3, not in any acute distress. CVS: S1-S2 clear, no murmur rubs or gallops Chest: clear to auscultation bilaterally, no wheezing, rales or rhonchi Abdomen: soft nontender, distended, normal bowel sounds  Extremities: no cyanosis, clubbing or edema noted bilaterally Neuro: Cranial nerves II-XII intact, no focal neurological deficits  Lab Results: Basic Metabolic  Panel:  Recent Labs Lab 11/16/13 0132 11/17/13 0406  NA 139 139  K 3.4* 4.0  CL 99 106  CO2 21 19  GLUCOSE 216* 125*  BUN 12 11  CREATININE 0.63 0.63  CALCIUM 8.3* 7.8*  MG  --  1.9   Liver Function Tests:  Recent Labs Lab 11/15/13 1535 11/16/13 0132  AST 40* 40*  ALT 43 40  ALKPHOS 54 47  BILITOT 0.7 1.0  PROT 7.2 6.5  ALBUMIN 4.7 3.9    Recent Labs Lab 11/16/13 0132 11/17/13 0406  LIPASE 1366* 174*   No results found for this basename: AMMONIA,  in the last 168 hours CBC:  Recent Labs Lab 11/15/13 1535 11/16/13 0132  WBC 10.4 10.3  NEUTROABS 7.8* 9.4*  HGB 14.5 13.8  HCT 38.6* 37.4*  MCV 118.0* 116.1*  PLT 248 169   Cardiac Enzymes:  Recent Labs Lab 11/16/13 0132  TROPONINI <0.30   BNP: No components found with this basename: POCBNP,  CBG:  Recent Labs Lab 11/16/13 2006 11/16/13 2356 11/17/13 0359 11/17/13 0738 11/17/13 1130  GLUCAP 121* 118* 130* 103* 93     Micro Results: No results found for this or any previous visit (from the past 240 hour(s)).  Studies/Results: Ct Abdomen Pelvis W Contrast  11/15/2013   CLINICAL DATA:  Abdominal pain, nausea and vomiting.  EXAM: CT ABDOMEN AND PELVIS WITH CONTRAST  TECHNIQUE: Multidetector CT imaging of the abdomen and pelvis was performed using the standard protocol following bolus administration of intravenous contrast.  CONTRAST:  68mL OMNIPAQUE IOHEXOL 300 MG/ML SOLN, 133mL OMNIPAQUE IOHEXOL 300 MG/ML SOLN  COMPARISON:  None.  FINDINGS: Acute inflammatory processes centered in the retroperitoneum and abuts the tail of the pancreas as well as the stomach, spleen and pericolic region on the left. The findings most likely reflect acute pancreatitis. Correlation suggested with amylase and lipase levels.  No associated pancreatic necrosis or focal abscess is identified. There is no evidence of pancreatic mass or calcification. The liver demonstrates diffuse steatosis without cirrhotic changes. No  biliary ductal dilatation is seen. The gallbladder, spleen, adrenal glands and kidneys are unremarkable.  There is associated ileus involving proximal jejunum which shows mild dilatation. No free air is identified. There are no focal masses or enlarged lymph nodes. No vascular abnormalities are identified. The portal vein, splenic vein and superior mesenteric vein show normal patency. The bladder is unremarkable. No hernias are identified. Bony structures are within normal limits.  IMPRESSION: 1. Acute inflammatory process centered around the tail of the pancreas most likely representing acute pancreatitis. No associated pancreatic necrosis or abnormal fluid collections are identified. 2. Ileus of the jejunum. 3. Diffuse steatosis of the liver.   Electronically Signed   By: Aletta Edouard M.D.   On: 11/15/2013 18:30    Medications: Scheduled Meds: . amLODipine  2.5 mg Oral Daily  . atenolol  50 mg Oral Daily  . enoxaparin (LOVENOX) injection  40 mg Subcutaneous Daily  . insulin aspart  0-15 Units Subcutaneous 6 times per day      LOS: 2 days   Ripudeep Krystal Eaton M.D. [facility] 11/17/2013, 4:00 PM Pager: [phone]  If 7PM-7AM, please contact night-coverage [email or web address] Password [email or web address]  **Disclaimer: This note was dictated with voice recognition software. Similar sounding words can inadvertently be transcribed and this note may contain transcription errors which may not have been corrected upon publication of note.**

## 2013-11-17 NOTE — Progress Notes (Signed)
[facility]    Date of Admission:  11/15/2013      ID: [name] is a 68 y.o. male with DM2, CAD, HLD, longstanding well controlled HIV disease, CD 4 count of 514/VL<40 has been on combivir/NVP for many years. He was sought care on 4/14 for new onset epigastric pain  Active Problems:   Pancreatitis   Acute pancreatitis    Subjective: Less abdominal pain, labs revealing lipase trending down appropriately. Tolerating sips of soda  Medications:  . amLODipine  2.5 mg Oral Daily  . atenolol  50 mg Oral Daily  . enoxaparin (LOVENOX) injection  40 mg Subcutaneous Daily  . insulin aspart  0-15 Units Subcutaneous 6 times per day    Objective: Vital signs in last 24 hours: Temp:  [98.4 F (36.9 C)-99.3 F (37.4 C)] 99.3 F (37.4 C) (04/16 1352) Pulse Rate:  [87-91] 91 (04/16 1352) Resp:  [16-17] 16 (04/16 1352) BP: (131-154)/(74-82) 131/78 mmHg (04/16 1352) SpO2:  [98 %-100 %] 98 % (04/16 1352) Weight:  [128 lb 4.8 oz (58.196 kg)] 128 lb 4.8 oz (58.196 kg) (04/16 0700) Physical Exam  Constitutional: He is oriented to person, place, and time. He appears well-developed and well-nourished. No distress.  HENT:  Mouth/Throat: Oropharynx is clear and moist. No oropharyngeal exudate.  Neurological: He is alert and oriented to person, place, and time.  Skin: Skin is warm and dry. No rash noted. No erythema.    Lab Results  Recent Labs  11/15/13 1535 11/16/13 0132 11/17/13 0406  WBC 10.4 10.3  --   HGB 14.5 13.8  --   HCT 38.6* 37.4*  --   NA 143 139 139  K 3.3* 3.4* 4.0  CL 100 99 106  CO2 23 21 19   BUN 11 12 11   CREATININE 0.80 0.63 0.63   Liver Panel  Recent Labs  11/15/13 1535 11/16/13 0132  PROT 7.2 6.5  ALBUMIN 4.7 3.9  AST 40* 40*  ALT 43 40  ALKPHOS 54 47  BILITOT 0.7 1.0  BILIDIR  --  0.3  IBILI  --  0.7    Studies/Results: Ct Abdomen Pelvis W Contrast  11/15/2013   CLINICAL DATA:  Abdominal pain, nausea and vomiting.   EXAM: CT ABDOMEN AND PELVIS WITH CONTRAST  TECHNIQUE: Multidetector CT imaging of the abdomen and pelvis was performed using the standard protocol following bolus administration of intravenous contrast.  CONTRAST:  41mL OMNIPAQUE IOHEXOL 300 MG/ML SOLN, 143mL OMNIPAQUE IOHEXOL 300 MG/ML SOLN  COMPARISON:  None.  FINDINGS: Acute inflammatory processes centered in the retroperitoneum and abuts the tail of the pancreas as well as the stomach, spleen and pericolic region on the left. The findings most likely reflect acute pancreatitis. Correlation suggested with amylase and lipase levels.  No associated pancreatic necrosis or focal abscess is identified. There is no evidence of pancreatic mass or calcification. The liver demonstrates diffuse steatosis without cirrhotic changes. No biliary ductal dilatation is seen. The gallbladder, spleen, adrenal glands and kidneys are unremarkable.  There is associated ileus involving proximal jejunum which shows mild dilatation. No free air is identified. There are no focal masses or enlarged lymph nodes. No vascular abnormalities are identified. The portal vein, splenic vein and superior mesenteric vein show normal patency. The bladder is unremarkable. No hernias are identified. Bony structures are within normal limits.  IMPRESSION: 1. Acute inflammatory process centered around the tail of the pancreas most likely representing acute pancreatitis. No associated pancreatic necrosis  or abnormal fluid collections are identified. 2. Ileus of the jejunum. 3. Diffuse steatosis of the liver.   Electronically Signed   By: Aletta Edouard M.D.   On: 11/15/2013 18:30     Assessment/Plan: Pancreatitis = appears he is improving. Advancing to clear diet this eveneing  hiv = continue off of ART for now until he has another few days of rest, then will start a new regimen, of stribild 1 tab daily likely next week as an outpatient  hld = please decrease atorvastatin to 10mg  daily.(since  drug interaction btw atorvastatin and stribild)  We will schedule appt with Dr. Megan Salon and have him start stribild as o/p next week. Will sign off  [facility] for Infectious Diseases Cell: [phone] Pager: [phone]  11/17/2013, 1:54 PM

## 2013-11-18 ENCOUNTER — Inpatient Hospital Stay (HOSPITAL_COMMUNITY): Payer: Medicare Other

## 2013-11-18 ENCOUNTER — Encounter (HOSPITAL_COMMUNITY): Payer: Self-pay | Admitting: General Surgery

## 2013-11-18 DIAGNOSIS — R188 Other ascites: Secondary | ICD-10-CM

## 2013-11-18 DIAGNOSIS — R1013 Epigastric pain: Secondary | ICD-10-CM

## 2013-11-18 DIAGNOSIS — R112 Nausea with vomiting, unspecified: Secondary | ICD-10-CM

## 2013-11-18 HISTORY — DX: Other ascites: R18.8

## 2013-11-18 LAB — HEPATIC FUNCTION PANEL
ALBUMIN: 2.4 g/dL — AB (ref 3.5–5.2)
ALT: 21 U/L (ref 0–53)
AST: 35 U/L (ref 0–37)
Alkaline Phosphatase: 50 U/L (ref 39–117)
Bilirubin, Direct: 1.1 mg/dL — ABNORMAL HIGH (ref 0.0–0.3)
Indirect Bilirubin: 0.8 mg/dL (ref 0.3–0.9)
TOTAL PROTEIN: 5.9 g/dL — AB (ref 6.0–8.3)
Total Bilirubin: 1.9 mg/dL — ABNORMAL HIGH (ref 0.3–1.2)

## 2013-11-18 LAB — HEMOGLOBIN A1C
Hgb A1c MFr Bld: 6.4 % — ABNORMAL HIGH (ref <5.7)
Mean Plasma Glucose: 137 mg/dL — ABNORMAL HIGH (ref <117)

## 2013-11-18 LAB — GLUCOSE, CAPILLARY
GLUCOSE-CAPILLARY: 147 mg/dL — AB (ref 70–99)
Glucose-Capillary: 174 mg/dL — ABNORMAL HIGH (ref 70–99)
Glucose-Capillary: 185 mg/dL — ABNORMAL HIGH (ref 70–99)
Glucose-Capillary: 212 mg/dL — ABNORMAL HIGH (ref 70–99)

## 2013-11-18 LAB — BASIC METABOLIC PANEL
BUN: 13 mg/dL (ref 6–23)
CALCIUM: 8.5 mg/dL (ref 8.4–10.5)
CO2: 19 mEq/L (ref 19–32)
Chloride: 101 mEq/L (ref 96–112)
Creatinine, Ser: 0.62 mg/dL (ref 0.50–1.35)
GFR calc Af Amer: >90 mL/min (ref >90)
GFR calc non Af Amer: >90 mL/min (ref >90)
GLUCOSE: 153 mg/dL — AB (ref 70–99)
POTASSIUM: 3.5 meq/L — AB (ref 3.7–5.3)
Sodium: 135 mEq/L — ABNORMAL LOW (ref 137–147)

## 2013-11-18 LAB — LIPASE, BLOOD: LIPASE: 72 U/L — AB (ref 11–59)

## 2013-11-18 LAB — LACTATE DEHYDROGENASE: LDH: 484 U/L — AB (ref 94–250)

## 2013-11-18 MED ORDER — POTASSIUM CHLORIDE CRYS ER 20 MEQ PO TBCR
40.0000 meq | EXTENDED_RELEASE_TABLET | Freq: Once | ORAL | Status: AC
  Administered 2013-11-18: 40 meq via ORAL
  Filled 2013-11-18: qty 2

## 2013-11-18 NOTE — Progress Notes (Signed)
Patient ID: [name], male   DOB: [birth year], 68 y.o.   MRN: [id number] Pt presented to [place] dept today for paracentesis. On limited US of abd in all four quadrants there is only a trace amount of ascites noted around liver edge which is not amenable to safely tap at this time. Procedure cancelled and Dr. Tana Coast notified.

## 2013-11-18 NOTE — Progress Notes (Signed)
Patient ID: [name]  male  IRS:[id number]    DOB: [birth year]    DOA: 11/15/2013  PCP: Penni Homans, MD  Assessment/Plan: Principal Problem:   Acute pancreatitis - Unclear etiology, ID consulted, for possibility of antiretrovirals causing acute pancreatitis. Per ID, Dr. Graylon Good, well known that pancreatitis occurs at high frequencies HIV infected patients, due to retrovirals. ID commended to hold HIV meds until resolution of the symptoms. - Lipase improving, patient is  tolerating liquid diet - Abdominal ultrasound with ? Cholecystitis, persistent gallbladder wall thickening and trace dependent sludge. Requested for surgery consult  Active Problems: Abdominal distention: New ascites, although patient states that it has been there for "some time" -Abdominal Ultrasound showed persistent gallbladder wall thickening and trace dependent sludge which could represent cholecystitis or could be related to hypoproteinemia or secondary edema related to pancreatitis, hepatic steatosis, ascites. I requested a surgery consult - Obtain ultrasound guided paracentesis with studies    DIABETES MELLITUS, TYPE II Place on sliding scale insulin     HYPERTENSION -  continue atenolol, Norvasc     CORONARY ARTERY DISEASE - Stable     GERD continue PPI    DVT Prophylaxis: Lovenox   Code Status: full code   Family Communication:  Disposition: in 1-2 days   Consultants:  ID, Dr. Graylon Good   General surgery  Procedures:  None   Antibiotics:  None     Subjective: Patient seen and examined denies any specific complaints. Still has significant abdominal distention states pain is improving.   Objective: Weight change: -0.408 kg (-14.4 oz)  Intake/Output Summary (Last 24 hours) at 11/18/13 1440 Last data filed at 11/18/13 1015  Gross per 24 hour  Intake   1360 ml  Output   1000 ml  Net    360 ml   Blood pressure 126/74, pulse 96, temperature 98.2 F (36.8 C), temperature source  Oral, resp. rate 16, height 5\' 3"  (1.6 m), weight 57.788 kg (127 lb 6.4 oz), SpO2 94.00%.  Physical Exam: General: Alert and awake, oriented x3, not in any acute distress. CVS: S1-S2 clear, no murmur rubs or gallops Chest: clear to auscultation bilaterally, no wheezing, rales or rhonchi Abdomen: soft nontender, distended, normal bowel sounds  Extremities: no cyanosis, clubbing or edema noted bilaterally   Lab Results: Basic Metabolic Panel:  Recent Labs Lab 11/17/13 0406 11/18/13 0622  NA 139 135*  K 4.0 3.5*  CL 106 101  CO2 19 19  GLUCOSE 125* 153*  BUN 11 13  CREATININE 0.63 0.62  CALCIUM 7.8* 8.5  MG 1.9  --    Liver Function Tests:  Recent Labs Lab 11/15/13 1535 11/16/13 0132  AST 40* 40*  ALT 43 40  ALKPHOS 54 47  BILITOT 0.7 1.0  PROT 7.2 6.5  ALBUMIN 4.7 3.9    Recent Labs Lab 11/17/13 0406 11/18/13 0622  LIPASE 174* 72*   No results found for this basename: AMMONIA,  in the last 168 hours CBC:  Recent Labs Lab 11/15/13 1535 11/16/13 0132  WBC 10.4 10.3  NEUTROABS 7.8* 9.4*  HGB 14.5 13.8  HCT 38.6* 37.4*  MCV 118.0* 116.1*  PLT 248 169   Cardiac Enzymes:  Recent Labs Lab 11/16/13 0132  TROPONINI <0.30   BNP: No components found with this basename: POCBNP,  CBG:  Recent Labs Lab 11/17/13 1130 11/17/13 1613 11/17/13 2210 11/18/13 0713 11/18/13 1131  GLUCAP 93 137* 167* 174* 185*     Micro Results: No results found for this  or any previous visit (from the past 240 hour(s)).  Studies/Results: Ct Abdomen Pelvis W Contrast  11/15/2013   CLINICAL DATA:  Abdominal pain, nausea and vomiting.  EXAM: CT ABDOMEN AND PELVIS WITH CONTRAST  TECHNIQUE: Multidetector CT imaging of the abdomen and pelvis was performed using the standard protocol following bolus administration of intravenous contrast.  CONTRAST:  10mL OMNIPAQUE IOHEXOL 300 MG/ML SOLN, 138mL OMNIPAQUE IOHEXOL 300 MG/ML SOLN  COMPARISON:  None.  FINDINGS: Acute inflammatory  processes centered in the retroperitoneum and abuts the tail of the pancreas as well as the stomach, spleen and pericolic region on the left. The findings most likely reflect acute pancreatitis. Correlation suggested with amylase and lipase levels.  No associated pancreatic necrosis or focal abscess is identified. There is no evidence of pancreatic mass or calcification. The liver demonstrates diffuse steatosis without cirrhotic changes. No biliary ductal dilatation is seen. The gallbladder, spleen, adrenal glands and kidneys are unremarkable.  There is associated ileus involving proximal jejunum which shows mild dilatation. No free air is identified. There are no focal masses or enlarged lymph nodes. No vascular abnormalities are identified. The portal vein, splenic vein and superior mesenteric vein show normal patency. The bladder is unremarkable. No hernias are identified. Bony structures are within normal limits.  IMPRESSION: 1. Acute inflammatory process centered around the tail of the pancreas most likely representing acute pancreatitis. No associated pancreatic necrosis or abnormal fluid collections are identified. 2. Ileus of the jejunum. 3. Diffuse steatosis of the liver.   Electronically Signed   By: Aletta Edouard M.D.   On: 11/15/2013 18:30    Medications: Scheduled Meds: . amLODipine  2.5 mg Oral Daily  . atenolol  50 mg Oral Daily  . enoxaparin (LOVENOX) injection  40 mg Subcutaneous Daily  . insulin aspart  0-15 Units Subcutaneous TID WC  . insulin aspart  0-5 Units Subcutaneous QHS      LOS: 3 days   Ripudeep Krystal Eaton M.D. [facility] 11/18/2013, 2:40 PM Pager: [phone]  If 7PM-7AM, please contact night-coverage [email or web address] Password [email or web address]  **Disclaimer: This note was dictated with voice recognition software. Similar sounding words can inadvertently be transcribed and this note may contain transcription errors which may not have been corrected upon publication of  note.**

## 2013-11-18 NOTE — Consult Note (Signed)
Epigastric abdominal pain - could be from pancreatitis Unclear whether he has a biliary etiology for his pancreatitis.  HIDA scan to rule out gallbladder disease.   Elevated surgical risk  Imogene Burn. Georgette Dover, MD, [facility]  General/ Trauma Surgery  11/18/2013 4:26 PM

## 2013-11-18 NOTE — Consult Note (Signed)
Reason for Consult: abdominal pain, pancreatitis  Referring Physician: Dr. Estill Cotta Cardiologist: Dr. Stanford Breed   HPI: [name] is a 68 year old male with a history of HIV, CAD s/p CABG in 2003, diabetes mellitus, HTN who developed epigastric abdominal pain on Tuesday.  Onset was sudden.  Coarse is improved.  Moderate in severity.  Associated with nausea and vomiting.  He was [facility] at [facility].  A CT of abdomen showed acute pancreatitis.  LFTs including bilirubin was normal.  White count was normal.  Lipase was 1828.  Lipase is down to 72.  He reports his pain at 5/10 at present time.  A [place] of abdomen today GB wall thickening and trace sludge.  He has been afebrile.  VSS.  He is not on antibiotics.  No WBC since the 15th.  He is tolerating clears.    Past Medical History  Diagnosis Date  . Arthritis   . Diabetes mellitus   . History of depression   . GERD (gastroesophageal reflux disease)   . CAD (coronary artery disease)     2003- cabg  . Hypertension   . Hyperlipidemia   . History of kidney stones   . COPD (chronic obstructive pulmonary disease)   . HIV (human immunodeficiency virus infection)   . Dermatitis 11/27/2012  . Nocturia 03/06/2013  . Epicondylitis 06/05/2013    right    Past Surgical History  Procedure Laterality Date  . Coronary artery bypass graft  05/2002  . Vasectomy      Family History  Problem Relation Age of Onset  . Arthritis      mother/father/paternal grandparents  . Breast cancer Maternal Aunt     paternal aunt  . Lung cancer Maternal Aunt   . Hyperlipidemia Mother   . Hyperlipidemia Father   . Heart disease      parents/maternal grandparents/ 2 brothers  . Stroke Paternal Grandmother   . Hypertension Father     paternal grandmother/3 brothers/1 sister  . Mental retardation Sister   . Diabetes Mother     paternal grandparents/1 brother    Social History:  reports that he has never smoked. He has never used smokeless tobacco. He  reports that he does not drink alcohol or use illicit drugs.  Allergies: No Known Allergies  Medications:  Scheduled Meds: . amLODipine  2.5 mg Oral Daily  . atenolol  50 mg Oral Daily  . enoxaparin (LOVENOX) injection  40 mg Subcutaneous Daily  . insulin aspart  0-15 Units Subcutaneous TID WC  . insulin aspart  0-5 Units Subcutaneous QHS   Continuous Infusions:  PRN Meds:.acetaminophen, acetaminophen, hydrALAZINE, HYDROmorphone (DILAUDID) injection, ondansetron (ZOFRAN) IV, ondansetron, promethazine, triamcinolone cream  Results for orders placed during the hospital encounter of 11/15/13 (from the past 48 hour(s))  GLUCOSE, CAPILLARY     Status: Abnormal   Collection Time    11/16/13  4:15 PM      Result Value Ref Range   Glucose-Capillary 157 (*) 70 - 99 mg/dL  GLUCOSE, CAPILLARY     Status: Abnormal   Collection Time    11/16/13  8:06 PM      Result Value Ref Range   Glucose-Capillary 121 (*) 70 - 99 mg/dL   Comment 1 Notify RN     Comment 2 Documented in Chart    GLUCOSE, CAPILLARY     Status: Abnormal   Collection Time    11/16/13 11:56 PM      Result Value Ref Range  Glucose-Capillary 118 (*) 70 - 99 mg/dL   Comment 1 Notify RN     Comment 2 Documented in Chart    GLUCOSE, CAPILLARY     Status: Abnormal   Collection Time    11/17/13  3:59 AM      Result Value Ref Range   Glucose-Capillary 130 (*) 70 - 99 mg/dL   Comment 1 Notify RN     Comment 2 Documented in Chart    BASIC METABOLIC PANEL     Status: Abnormal   Collection Time    11/17/13  4:06 AM      Result Value Ref Range   Sodium 139  137 - 147 mEq/L   Potassium 4.0  3.7 - 5.3 mEq/L   Chloride 106  96 - 112 mEq/L   CO2 19  19 - 32 mEq/L   Glucose, Bld 125 (*) 70 - 99 mg/dL   BUN 11  6 - 23 mg/dL   Creatinine, Ser 0.63  0.50 - 1.35 mg/dL   Calcium 7.8 (*) 8.4 - 10.5 mg/dL   GFR calc non Af Amer >90  >90 mL/min   GFR calc Af Amer >90  >90 mL/min   Comment: (NOTE)     The eGFR has been calculated  using the CKD EPI equation.     This calculation has not been validated in all clinical situations.     eGFR's persistently <90 mL/min signify possible Chronic Kidney     Disease.  MAGNESIUM     Status: None   Collection Time    11/17/13  4:06 AM      Result Value Ref Range   Magnesium 1.9  1.5 - 2.5 mg/dL  LIPASE, BLOOD     Status: Abnormal   Collection Time    11/17/13  4:06 AM      Result Value Ref Range   Lipase 174 (*) 11 - 59 U/L  GLUCOSE, CAPILLARY     Status: Abnormal   Collection Time    11/17/13  7:38 AM      Result Value Ref Range   Glucose-Capillary 103 (*) 70 - 99 mg/dL  GLUCOSE, CAPILLARY     Status: None   Collection Time    11/17/13 11:30 AM      Result Value Ref Range   Glucose-Capillary 93  70 - 99 mg/dL  GLUCOSE, CAPILLARY     Status: Abnormal   Collection Time    11/17/13  4:13 PM      Result Value Ref Range   Glucose-Capillary 137 (*) 70 - 99 mg/dL  HEMOGLOBIN A1C     Status: Abnormal   Collection Time    11/17/13  5:05 PM      Result Value Ref Range   Hemoglobin A1C 6.4 (*) <5.7 %   Comment: (NOTE)                                                                               According to the ADA Clinical Practice Recommendations for 2011, when     HbA1c is used as a screening test:      >=6.5%   Diagnostic of Diabetes Mellitus               (  if abnormal result is confirmed)     5.7-6.4%   Increased risk of developing Diabetes Mellitus     References:Diagnosis and Classification of Diabetes Mellitus,Diabetes     ERDE,0814,48(JEHUD 1):S62-S69 and Standards of Medical Care in             Diabetes - 2011,Diabetes JSHF,0263,78 (Suppl 1):S11-S61.   Mean Plasma Glucose 137 (*) <117 mg/dL   Comment: Performed at [place], CAPILLARY     Status: Abnormal   Collection Time    11/17/13 10:10 PM      Result Value Ref Range   Glucose-Capillary 167 (*) 70 - 99 mg/dL  BASIC METABOLIC PANEL     Status: Abnormal   Collection Time     11/18/13  6:22 AM      Result Value Ref Range   Sodium 135 (*) 137 - 147 mEq/L   Potassium 3.5 (*) 3.7 - 5.3 mEq/L   Chloride 101  96 - 112 mEq/L   CO2 19  19 - 32 mEq/L   Glucose, Bld 153 (*) 70 - 99 mg/dL   BUN 13  6 - 23 mg/dL   Creatinine, Ser 0.62  0.50 - 1.35 mg/dL   Calcium 8.5  8.4 - 10.5 mg/dL   GFR calc non Af Amer >90  >90 mL/min   GFR calc Af Amer >90  >90 mL/min   Comment: (NOTE)     The eGFR has been calculated using the CKD EPI equation.     This calculation has not been validated in all clinical situations.     eGFR's persistently <90 mL/min signify possible Chronic Kidney     Disease.  LIPASE, BLOOD     Status: Abnormal   Collection Time    11/18/13  6:22 AM      Result Value Ref Range   Lipase 72 (*) 11 - 59 U/L  GLUCOSE, CAPILLARY     Status: Abnormal   Collection Time    11/18/13  7:13 AM      Result Value Ref Range   Glucose-Capillary 174 (*) 70 - 99 mg/dL  GLUCOSE, CAPILLARY     Status: Abnormal   Collection Time    11/18/13 11:31 AM      Result Value Ref Range   Glucose-Capillary 185 (*) 70 - 99 mg/dL    US Abdomen Complete  11/18/2013   CLINICAL DATA:  Pancreatitis, abdominal distention  EXAM: ULTRASOUND ABDOMEN COMPLETE  COMPARISON:  CT ABD/PELVIS W CM dated 11/15/2013  FINDINGS: Gallbladder:  Gallbladder wall thickening is reidentified without shadowing stone. Minimal dependent sludge identified. No sonographic Murphy sign. Gallbladder wall thickness 4 mm.  Common bile duct:  Diameter: 7 mm  Liver:  Diffusely increased in echogenicity compatible is steatosis with an area of focal sparing adjacent to the gallbladder fossa reidentified. No intrahepatic ductal dilatation.  IVC:  Not well visualized due to hepatic shadowing and bowel gas.  Pancreas:  Not well visualized due to bowel gas.  Spleen:  Size and appearance within normal limits.  Right Kidney:  Length: 10.4 cm. Echogenicity within normal limits. No mass or hydronephrosis visualized.  Left Kidney:   Length: 11.7 cm. Echogenicity within normal limits. No mass or hydronephrosis visualized.  Abdominal aorta:  Not well visualized due to overlying bowel gas. Mid abdominal aorta is non aneurysmal.  Other findings:  Ascites.  IMPRESSION: Persistent gallbladder wall thickening and trace dependent sludge which could represent cholecystitis or could be related to hypoproteinemia or secondary edema  related to pancreatitis as per the previously seen findings.  Hepatic steatosis reidentified.   Electronically Signed   By: Conchita Paris M.D.   On: 11/18/2013 08:52    Review of Systems  All other systems reviewed and are negative.  Blood pressure 126/74, pulse 96, temperature 98.2 F (36.8 C), temperature source Oral, resp. rate 16, height '5\' 3"'  (1.6 m), weight 127 lb 6.4 oz (57.788 kg), SpO2 94.00%. Physical Exam  Constitutional: He is oriented to person, place, and time. He appears well-developed and well-nourished. No distress.  Cardiovascular: Normal rate, regular rhythm, normal heart sounds and intact distal pulses.  Exam reveals no gallop and no friction rub.   No murmur heard. Respiratory: Effort normal and breath sounds normal. No respiratory distress. He has no wheezes. He has no rales. He exhibits no tenderness.  GI: Soft. He exhibits no mass. There is no guarding.  Mild generalized ttp, negative murphy's sign, protuberant abdomen   Musculoskeletal: Normal range of motion. He exhibits no edema.  Neurological: He is alert and oriented to person, place, and time.  Skin: Skin is warm and dry. No rash noted. He is not diaphoretic. No erythema. No pallor.  Psychiatric: He has a normal mood and affect. His behavior is normal. Thought content normal.    Assessment/Plan: Acute pancreatitis, etiology unclear, possibly related to ART  Possible acute cholecystitis -repeat CBC and CMP -proceed with a HIDA scan.  He cannot have any narcotics 6 hours prior to the test.   -NPO after midnight -if  positive, we will need cardiology clearance for this patient prior to surgery  Marely Apgar ANP-BC 11/18/2013, 3:15 PM

## 2013-11-19 ENCOUNTER — Inpatient Hospital Stay (HOSPITAL_COMMUNITY): Payer: Medicare Other

## 2013-11-19 DIAGNOSIS — E785 Hyperlipidemia, unspecified: Secondary | ICD-10-CM

## 2013-11-19 DIAGNOSIS — K810 Acute cholecystitis: Secondary | ICD-10-CM

## 2013-11-19 DIAGNOSIS — E782 Mixed hyperlipidemia: Secondary | ICD-10-CM | POA: Diagnosis present

## 2013-11-19 DIAGNOSIS — K859 Acute pancreatitis without necrosis or infection, unspecified: Secondary | ICD-10-CM

## 2013-11-19 DIAGNOSIS — I10 Essential (primary) hypertension: Secondary | ICD-10-CM

## 2013-11-19 HISTORY — DX: Hyperlipidemia, unspecified: E78.5

## 2013-11-19 LAB — CBC
HEMATOCRIT: 34.8 % — AB (ref 39.0–52.0)
HEMOGLOBIN: 12.7 g/dL — AB (ref 13.0–17.0)
MCH: 42.6 pg — AB (ref 26.0–34.0)
MCHC: 36.5 g/dL — ABNORMAL HIGH (ref 30.0–36.0)
MCV: 116.8 fL — ABNORMAL HIGH (ref 78.0–100.0)
Platelets: 129 10*3/uL — ABNORMAL LOW (ref 150–400)
RBC: 2.98 MIL/uL — ABNORMAL LOW (ref 4.22–5.81)
RDW: 13.8 % (ref 11.5–15.5)
WBC: 8.5 10*3/uL (ref 4.0–10.5)

## 2013-11-19 LAB — GLUCOSE, CAPILLARY
GLUCOSE-CAPILLARY: 130 mg/dL — AB (ref 70–99)
GLUCOSE-CAPILLARY: 107 mg/dL — AB (ref 70–99)
Glucose-Capillary: 151 mg/dL — ABNORMAL HIGH (ref 70–99)
Glucose-Capillary: 163 mg/dL — ABNORMAL HIGH (ref 70–99)

## 2013-11-19 LAB — AMYLASE: AMYLASE: 141 U/L — AB (ref 0–105)

## 2013-11-19 LAB — COMPREHENSIVE METABOLIC PANEL
ALBUMIN: 2.2 g/dL — AB (ref 3.5–5.2)
ALK PHOS: 59 U/L (ref 39–117)
ALT: 20 U/L (ref 0–53)
AST: 29 U/L (ref 0–37)
BILIRUBIN TOTAL: 1.8 mg/dL — AB (ref 0.3–1.2)
BUN: 21 mg/dL (ref 6–23)
CO2: 19 meq/L (ref 19–32)
CREATININE: 0.73 mg/dL (ref 0.50–1.35)
Calcium: 8.4 mg/dL (ref 8.4–10.5)
Chloride: 103 mEq/L (ref 96–112)
GFR calc Af Amer: >90 mL/min (ref >90)
GFR calc non Af Amer: >90 mL/min (ref >90)
Glucose, Bld: 168 mg/dL — ABNORMAL HIGH (ref 70–99)
Potassium: 4.5 mEq/L (ref 3.7–5.3)
Sodium: 139 mEq/L (ref 137–147)
Total Protein: 5.8 g/dL — ABNORMAL LOW (ref 6.0–8.3)

## 2013-11-19 LAB — LIPASE, BLOOD: LIPASE: 73 U/L — AB (ref 11–59)

## 2013-11-19 MED ORDER — TECHNETIUM TC 99M MEBROFENIN IV KIT
5.0000 | PACK | Freq: Once | INTRAVENOUS | Status: AC | PRN
  Administered 2013-11-19: 5 via INTRAVENOUS

## 2013-11-19 NOTE — Progress Notes (Addendum)
HIDA negative for cholecystitis.  No current indications for gallbladder surgery.  Started on clears, advance as tolerated.

## 2013-11-19 NOTE — Progress Notes (Signed)
Pt seen and examined with PA C/o some mild mid abd pain  Nad, sitting in chair Protuberant abd. Soft, nt  F.u HIDA results  Leighton Ruff. Redmond Pulling, MD, FACS General, Bariatric, & Minimally Invasive Surgery [facility], [place]

## 2013-11-19 NOTE — Progress Notes (Signed)
Subjective: Pt still in pain in his lower abdomen, minimal epigastric pain.  Nauseated but no more vomiting.  Just got back from HIDA scan.  Says he doesn't drink any ETOH.  Objective: Vital signs in last 24 hours: Temp:  [98.1 F (36.7 C)-98.7 F (37.1 C)] 98.1 F (36.7 C) (04/18 0634) Pulse Rate:  [93-96] 93 (04/18 0634) Resp:  [16-18] 18 (04/18 0634) BP: (126-129)/(73-79) 129/79 mmHg (04/18 0634) SpO2:  [94 %-100 %] 100 % (04/18 0634) Weight:  [122 lb 4.8 oz (55.475 kg)] 122 lb 4.8 oz (55.475 kg) (04/18 0634) Last BM Date: 11/18/13  Intake/Output from previous day: 04/17 0701 - 04/18 0700 In: 360 [P.O.:360] Out: 350 [Urine:350] Intake/Output this shift:    PE: Gen:  Alert, NAD, pleasant Abd: Soft, large protruding abdomen which is some what distended, tender in the lower abdomen and mildly in the epigastrium, +BS, rectus diastasis and umbilical hernia.   Lab Results:   Recent Labs  11/19/13 0820  WBC 8.5  HGB 12.7*  HCT 34.8*  PLT 129*   BMET  Recent Labs  11/18/13 0622 11/19/13 0820  NA 135* 139  K 3.5* 4.5  CL 101 103  CO2 19 19  GLUCOSE 153* 168*  BUN 13 21  CREATININE 0.62 0.73  CALCIUM 8.5 8.4   PT/INR No results found for this basename: LABPROT, INR,  in the last 72 hours CMP     Component Value Date/Time   NA 139 11/19/2013 0820   K 4.5 11/19/2013 0820   CL 103 11/19/2013 0820   CO2 19 11/19/2013 0820   GLUCOSE 168* 11/19/2013 0820   BUN 21 11/19/2013 0820   CREATININE 0.73 11/19/2013 0820   CREATININE 0.77 08/29/2013 0902   CALCIUM 8.4 11/19/2013 0820   PROT 5.8* 11/19/2013 0820   ALBUMIN 2.2* 11/19/2013 0820   AST 29 11/19/2013 0820   ALT 20 11/19/2013 0820   ALKPHOS 59 11/19/2013 0820   BILITOT 1.8* 11/19/2013 0820   GFRNONAA >90 11/19/2013 0820   GFRNONAA >60 04/17/2011 0851   GFRAA >90 11/19/2013 0820   GFRAA >60 04/17/2011 0851   Lipase     Component Value Date/Time   LIPASE 73* 11/19/2013 0820       Studies/Results: US Abdomen  Complete  11/18/2013   CLINICAL DATA:  Pancreatitis, abdominal distention  EXAM: ULTRASOUND ABDOMEN COMPLETE  COMPARISON:  CT ABD/PELVIS W CM dated 11/15/2013  FINDINGS: Gallbladder:  Gallbladder wall thickening is reidentified without shadowing stone. Minimal dependent sludge identified. No sonographic Murphy sign. Gallbladder wall thickness 4 mm.  Common bile duct:  Diameter: 7 mm  Liver:  Diffusely increased in echogenicity compatible is steatosis with an area of focal sparing adjacent to the gallbladder fossa reidentified. No intrahepatic ductal dilatation.  IVC:  Not well visualized due to hepatic shadowing and bowel gas.  Pancreas:  Not well visualized due to bowel gas.  Spleen:  Size and appearance within normal limits.  Right Kidney:  Length: 10.4 cm. Echogenicity within normal limits. No mass or hydronephrosis visualized.  Left Kidney:  Length: 11.7 cm. Echogenicity within normal limits. No mass or hydronephrosis visualized.  Abdominal aorta:  Not well visualized due to overlying bowel gas. Mid abdominal aorta is non aneurysmal.  Other findings:  Ascites.  IMPRESSION: Persistent gallbladder wall thickening and trace dependent sludge which could represent cholecystitis or could be related to hypoproteinemia or secondary edema related to pancreatitis as per the previously seen findings.  Hepatic steatosis reidentified.   Electronically Signed  By: Conchita Paris M.D.   On: 11/18/2013 08:52   US Abdomen Limited  11/18/2013   CLINICAL DATA:  Ascites  EXAM: LIMITED ABDOMEN ULTRASOUND FOR ASCITES  TECHNIQUE: Limited ultrasound survey for ascites was performed in all four abdominal quadrants.  COMPARISON:  CT abdomen and pelvis 11/15/2013  FINDINGS: Survey imaging of the 4 quadrants was performed.  Small amount of ascites is identified in the right upper quadrant.  No additional significant ascites identified.  Visualized ascites in the perihepatic region is new since the prior CT.  IMPRESSION: Small amount  of perihepatic ascites in right upper quadrant new since 11/15/2013.   Electronically Signed   By: Lavonia Dana M.D.   On: 11/18/2013 17:01    Anti-infectives: Anti-infectives   None       Assessment/Plan Acute pancreatitis, etiology unclear, possibly related to ART  Possible acute cholecystitis  -Pending HIDA scan results -NPO until HIDA results then can likely start clears if no cholecystitis -Ambulate and IS -SCD's and on lovenox -If positive, we will need cardiology clearance for this patient prior to surgery     LOS: 4 days    [name] 11/19/2013, 9:35 AM Pager: [phone]

## 2013-11-19 NOTE — Progress Notes (Signed)
[facility] PROGRESS NOTE  KALAB CAMPS RDE:[id number] DOB: [birth year] DOA: 11/15/2013 PCP: Penni Homans, MD  Assessment/Plan:  HIV -Per patient disease continue patient on ART treatment. -Patient will be restarted on ART as an outpatient. Patient to followup with Dr. Megan Salon  Pancreatitis -Most likely secondary to patient's HIV + antiretrovirals treatment. -Antiretrovirals on hold, patient's lipase has trended down to 73 from a high of 1366 upon admission -Continue full liquids -Continue to monitor lipase  Cholecystitis -HIDA scan results pending. Surgery to decide on cholecystectomy after results read. -Per her note if surgery scheduled patient will need cardiology consult for clearance.  Diabetes type 2 -Hemoglobin A1c/16/2015= 6.4 -Continue SSI moderate which has been controlled patient's CBG  HTN -Within AHA guidelines -Continue medication unchanged  HLD -Lipitor has been stopped secondary to its interaction with antiretrovirals and possibly exacerbating his pancreatitis -Obtain lipid panel    Code Status: Full Family Communication: None at bedside Disposition Plan: Per surgery/infectious disease      Consultants: Dr. Carlyle Basques (infectious disease) Dr. Donnie Mesa (surgery)   Procedures: HIDA Scan results 11/19/2013 pending  Ultrasound abdomen complete 11/17/2013 -Persistent gallbladder wall thickening and trace dependent sludge could represent cholecystitis or could be related to hypoproteinemia or secondary edema related to pancreatitis   -Hepatic steatosis reidentified.  CT of the pelvis with contrast 11/15/2013 1. Acute inflammatory process centered around the tail of the pancreas most likely representing acute pancreatitis.  2. Ileus of the jejunum.  3. Diffuse steatosis of the liver.      Antibiotics:    HPI/Subjective: [name] is a 68 y.o BM PMHx CAD status post CABG, diabetes mellitus, hyperlipidemia, HIV presented to  the ER at [facility] with complaints of abdominal pain. Patient states his abdominal pain started off in the morning which was mostly in the epigastric area radiating to back stabbing in nature. Has had multiple episodes of nausea and vomiting denies any blood in the vomitus. In the ER patient was found to have elevated lipase and CT abdomen and pelvis shows features concerning for acute pancreatitis. Patient denies drinking alcohol and CAT scan does not show any features concerning for gallstones. Patient otherwise denies any chest pain or shortness of breath fever chills. 4/18 states high scan completed this a.m., currently able to consume medication clear liquids with only mild nausea at times.   Objective: Filed Vitals:   11/18/13 1326 11/18/13 2200 11/19/13 0634 11/19/13 1035  BP: 126/74 127/73 129/79 123/75  Pulse: 96 93 93 92  Temp: 98.2 F (36.8 C) 98.7 F (37.1 C) 98.1 F (36.7 C) 98.4 F (36.9 C)  TempSrc: Oral Oral Oral Oral  Resp: 16 16 18 16   Height:      Weight:   55.475 kg (122 lb 4.8 oz)   SpO2: 94% 98% 100% 97%    Intake/Output Summary (Last 24 hours) at 11/19/13 1309 Last data filed at 11/19/13 0900  Gross per 24 hour  Intake      0 ml  Output    350 ml  Net   -350 ml   Filed Weights   11/17/13 0700 11/18/13 0526 11/19/13 0634  Weight: 58.196 kg (128 lb 4.8 oz) 57.788 kg (127 lb 6.4 oz) 55.475 kg (122 lb 4.8 oz)    Exam:   General:  A./O. x4, NAD  Cardiovascular: Regular rhythm and rate, negative murmurs rubs or gallops  Respiratory: Clear to auscultation bilateral  Abdomen: Distended, nontender, hypoactive bowel sounds  Musculoskeletal: Cachectic, negative  pedal edema   Data Reviewed: Basic Metabolic Panel:  Recent Labs Lab 11/15/13 1535 11/16/13 0132 11/17/13 0406 11/18/13 0622 11/19/13 0820  NA 143 139 139 135* 139  K 3.3* 3.4* 4.0 3.5* 4.5  CL 100 99 106 101 103  CO2 23 21 19 19 19   GLUCOSE 244* 216* 125* 153* 168*  BUN 11 12  11 13 21   CREATININE 0.80 0.63 0.63 0.62 0.73  CALCIUM 9.7 8.3* 7.8* 8.5 8.4  MG  --   --  1.9  --   --    Liver Function Tests:  Recent Labs Lab 11/15/13 1535 11/16/13 0132 11/18/13 0622 11/19/13 0820  AST 40* 40* 35 29  ALT 43 40 21 20  ALKPHOS 54 47 50 59  BILITOT 0.7 1.0 1.9* 1.8*  PROT 7.2 6.5 5.9* 5.8*  ALBUMIN 4.7 3.9 2.4* 2.2*    Recent Labs Lab 11/15/13 1735 11/16/13 0132 11/17/13 0406 11/18/13 0622 11/19/13 0820  LIPASE 1828* 1366* 174* 72* 73*  AMYLASE  --   --   --   --  141*   No results found for this basename: AMMONIA,  in the last 168 hours CBC:  Recent Labs Lab 11/15/13 1535 11/16/13 0132 11/19/13 0820  WBC 10.4 10.3 8.5  NEUTROABS 7.8* 9.4*  --   HGB 14.5 13.8 12.7*  HCT 38.6* 37.4* 34.8*  MCV 118.0* 116.1* 116.8*  PLT 248 169 129*   Cardiac Enzymes:  Recent Labs Lab 11/16/13 0132  TROPONINI <0.30   BNP (last 3 results) No results found for this basename: PROBNP,  in the last 8760 hours CBG:  Recent Labs Lab 11/18/13 1131 11/18/13 1704 11/18/13 2158 11/19/13 0816 11/19/13 1153  GLUCAP 185* 147* 212* 151* 130*    No results found for this or any previous visit (from the past 240 hour(s)).   Studies: US Abdomen Complete  11/18/2013   CLINICAL DATA:  Pancreatitis, abdominal distention  EXAM: ULTRASOUND ABDOMEN COMPLETE  COMPARISON:  CT ABD/PELVIS W CM dated 11/15/2013  FINDINGS: Gallbladder:  Gallbladder wall thickening is reidentified without shadowing stone. Minimal dependent sludge identified. No sonographic Murphy sign. Gallbladder wall thickness 4 mm.  Common bile duct:  Diameter: 7 mm  Liver:  Diffusely increased in echogenicity compatible is steatosis with an area of focal sparing adjacent to the gallbladder fossa reidentified. No intrahepatic ductal dilatation.  IVC:  Not well visualized due to hepatic shadowing and bowel gas.  Pancreas:  Not well visualized due to bowel gas.  Spleen:  Size and appearance within normal  limits.  Right Kidney:  Length: 10.4 cm. Echogenicity within normal limits. No mass or hydronephrosis visualized.  Left Kidney:  Length: 11.7 cm. Echogenicity within normal limits. No mass or hydronephrosis visualized.  Abdominal aorta:  Not well visualized due to overlying bowel gas. Mid abdominal aorta is non aneurysmal.  Other findings:  Ascites.  IMPRESSION: Persistent gallbladder wall thickening and trace dependent sludge which could represent cholecystitis or could be related to hypoproteinemia or secondary edema related to pancreatitis as per the previously seen findings.  Hepatic steatosis reidentified.   Electronically Signed   By: Conchita Paris M.D.   On: 11/18/2013 08:52   US Abdomen Limited  11/18/2013   CLINICAL DATA:  Ascites  EXAM: LIMITED ABDOMEN ULTRASOUND FOR ASCITES  TECHNIQUE: Limited ultrasound survey for ascites was performed in all four abdominal quadrants.  COMPARISON:  CT abdomen and pelvis 11/15/2013  FINDINGS: Survey imaging of the 4 quadrants was performed.  Small amount of  ascites is identified in the right upper quadrant.  No additional significant ascites identified.  Visualized ascites in the perihepatic region is new since the prior CT.  IMPRESSION: Small amount of perihepatic ascites in right upper quadrant new since 11/15/2013.   Electronically Signed   By: Lavonia Dana M.D.   On: 11/18/2013 17:01    Scheduled Meds: . amLODipine  2.5 mg Oral Daily  . atenolol  50 mg Oral Daily  . enoxaparin (LOVENOX) injection  40 mg Subcutaneous Daily  . insulin aspart  0-15 Units Subcutaneous TID WC  . insulin aspart  0-5 Units Subcutaneous QHS   Continuous Infusions:   Principal Problem:   Acute pancreatitis Active Problems:   HIV INFECTION   DIABETES MELLITUS, TYPE II   HYPERTENSION   CORONARY ARTERY DISEASE   GERD   Ascites   HLD (hyperlipidemia)   Cholecystitis, acute    Time spent: 40 minutes   [place] [facility] Pager [phone]. If 7PM-7AM,  please contact night-coverage at [email or web address], password [email or web address] 11/19/2013, 1:09 PM  LOS: 4 days

## 2013-11-20 DIAGNOSIS — R197 Diarrhea, unspecified: Secondary | ICD-10-CM | POA: Diagnosis present

## 2013-11-20 HISTORY — DX: Diarrhea, unspecified: R19.7

## 2013-11-20 LAB — CBC WITH DIFFERENTIAL/PLATELET
Basophils Absolute: 0 10*3/uL (ref 0.0–0.1)
Basophils Relative: 0 % (ref 0–1)
Eosinophils Absolute: 0.1 10*3/uL (ref 0.0–0.7)
Eosinophils Relative: 2 % (ref 0–5)
HEMATOCRIT: 31.8 % — AB (ref 39.0–52.0)
HEMOGLOBIN: 11.2 g/dL — AB (ref 13.0–17.0)
LYMPHS ABS: 0.4 10*3/uL — AB (ref 0.7–4.0)
LYMPHS PCT: 5 % — AB (ref 12–46)
MCH: 41.2 pg — ABNORMAL HIGH (ref 26.0–34.0)
MCHC: 35.2 g/dL (ref 30.0–36.0)
MCV: 116.9 fL — ABNORMAL HIGH (ref 78.0–100.0)
MONO ABS: 1 10*3/uL (ref 0.1–1.0)
MONOS PCT: 12 % (ref 3–12)
NEUTROS ABS: 7 10*3/uL (ref 1.7–7.7)
NEUTROS PCT: 82 % — AB (ref 43–77)
Platelets: 133 10*3/uL — ABNORMAL LOW (ref 150–400)
RBC: 2.72 MIL/uL — AB (ref 4.22–5.81)
RDW: 13.8 % (ref 11.5–15.5)
WBC: 8.6 10*3/uL (ref 4.0–10.5)

## 2013-11-20 LAB — COMPREHENSIVE METABOLIC PANEL
ALBUMIN: 2.2 g/dL — AB (ref 3.5–5.2)
ALK PHOS: 85 U/L (ref 39–117)
ALT: 24 U/L (ref 0–53)
AST: 37 U/L (ref 0–37)
BILIRUBIN TOTAL: 1.3 mg/dL — AB (ref 0.3–1.2)
BUN: 16 mg/dL (ref 6–23)
CHLORIDE: 103 meq/L (ref 96–112)
CO2: 17 meq/L — AB (ref 19–32)
Calcium: 8.3 mg/dL — ABNORMAL LOW (ref 8.4–10.5)
Creatinine, Ser: 0.6 mg/dL (ref 0.50–1.35)
GLUCOSE: 148 mg/dL — AB (ref 70–99)
POTASSIUM: 3.5 meq/L — AB (ref 3.7–5.3)
Sodium: 140 mEq/L (ref 137–147)
Total Protein: 5.8 g/dL — ABNORMAL LOW (ref 6.0–8.3)

## 2013-11-20 LAB — LIPASE, BLOOD: LIPASE: 171 U/L — AB (ref 11–59)

## 2013-11-20 LAB — GLUCOSE, CAPILLARY
GLUCOSE-CAPILLARY: 133 mg/dL — AB (ref 70–99)
Glucose-Capillary: 149 mg/dL — ABNORMAL HIGH (ref 70–99)
Glucose-Capillary: 206 mg/dL — ABNORMAL HIGH (ref 70–99)
Glucose-Capillary: 163 mg/dL — ABNORMAL HIGH (ref 70–99)

## 2013-11-20 LAB — LIPID PANEL
Cholesterol: 100 mg/dL (ref 0–200)
HDL: 8 mg/dL — AB (ref >39)
LDL CALC: 39 mg/dL (ref 0–99)
Total CHOL/HDL Ratio: 12.5 RATIO
Triglycerides: 264 mg/dL — ABNORMAL HIGH (ref <150)
VLDL: 53 mg/dL — ABNORMAL HIGH (ref 0–40)

## 2013-11-20 NOTE — Progress Notes (Signed)
Pt not in room.  Discussed with PA.  No evidence of cholecystitis on HIDA No evidence of cholelithiasis on imaging.   Adv diet as tolerated Signing off Doesn't need to follow up with [place]  Leighton Ruff. Redmond Pulling, MD, FACS General, Bariatric, & Minimally Invasive Surgery [facility], [place]

## 2013-11-20 NOTE — Progress Notes (Signed)
Subjective: Pt feels much better.  Only mild intermittent nausea.  Abdominal pain much improved.  Distention still there.  Had clears last night which caused his pain to come back.  Clear liquids this am was fine though.  His diet has been advanced to fulls.  Objective: Vital signs in last 24 hours: Temp:  [97.9 F (36.6 C)-99.3 F (37.4 C)] 99.3 F (37.4 C) (04/19 0539) Pulse Rate:  [85-95] 95 (04/19 0539) Resp:  [15-16] 16 (04/19 0539) BP: (123-143)/(73-75) 143/75 mmHg (04/19 0539) SpO2:  [95 %-99 %] 95 % (04/19 0539) Weight:  [120 lb 8 oz (54.658 kg)] 120 lb 8 oz (54.658 kg) (04/19 0651) Last BM Date: 11/19/13  Intake/Output from previous day: 04/18 0701 - 04/19 0700 In: 360 [P.O.:360] Out: -  Intake/Output this shift:    PE: Gen:  Alert, NAD, pleasant Abd: Soft, large protuberant abdomen which is some what distended, tenderness resolved, +BS, rectus diastasis and umbilical hernia.   Lab Results:   Recent Labs  11/19/13 0820  WBC 8.5  HGB 12.7*  HCT 34.8*  PLT 129*   BMET  Recent Labs  11/18/13 0622 11/19/13 0820  NA 135* 139  K 3.5* 4.5  CL 101 103  CO2 19 19  GLUCOSE 153* 168*  BUN 13 21  CREATININE 0.62 0.73  CALCIUM 8.5 8.4   PT/INR No results found for this basename: LABPROT, INR,  in the last 72 hours CMP     Component Value Date/Time   NA 139 11/19/2013 0820   K 4.5 11/19/2013 0820   CL 103 11/19/2013 0820   CO2 19 11/19/2013 0820   GLUCOSE 168* 11/19/2013 0820   BUN 21 11/19/2013 0820   CREATININE 0.73 11/19/2013 0820   CREATININE 0.77 08/29/2013 0902   CALCIUM 8.4 11/19/2013 0820   PROT 5.8* 11/19/2013 0820   ALBUMIN 2.2* 11/19/2013 0820   AST 29 11/19/2013 0820   ALT 20 11/19/2013 0820   ALKPHOS 59 11/19/2013 0820   BILITOT 1.8* 11/19/2013 0820   GFRNONAA >90 11/19/2013 0820   GFRNONAA >60 04/17/2011 0851   GFRAA >90 11/19/2013 0820   GFRAA >60 04/17/2011 0851   Lipase     Component Value Date/Time   LIPASE 73* 11/19/2013 0820        Studies/Results: Nm Hepatobiliary  11/19/2013   CLINICAL DATA:  Abdominal pain  EXAM: NUCLEAR MEDICINE HEPATOBILIARY IMAGING  Views:  Anterior, right lateral right upper quadrant  Radionuclide: Technetium 9m Choletec  Dose:  5.0 mCi  Route of administration: Intravenous  COMPARISON:  None.  FINDINGS: Liver uptake of radiotracer is normal. There is prompt visualization of gallbladder and small bowel, indicating patency of the cystic and common bile ducts.  IMPRESSION: Study within normal limits.   Electronically Signed   By: Lowella Grip M.D.   On: 11/19/2013 15:05   US Abdomen Complete  11/18/2013   CLINICAL DATA:  Pancreatitis, abdominal distention  EXAM: ULTRASOUND ABDOMEN COMPLETE  COMPARISON:  CT ABD/PELVIS W CM dated 11/15/2013  FINDINGS: Gallbladder:  Gallbladder wall thickening is reidentified without shadowing stone. Minimal dependent sludge identified. No sonographic Murphy sign. Gallbladder wall thickness 4 mm.  Common bile duct:  Diameter: 7 mm  Liver:  Diffusely increased in echogenicity compatible is steatosis with an area of focal sparing adjacent to the gallbladder fossa reidentified. No intrahepatic ductal dilatation.  IVC:  Not well visualized due to hepatic shadowing and bowel gas.  Pancreas:  Not well visualized due to bowel gas.  Spleen:  Size and appearance within normal limits.  Right Kidney:  Length: 10.4 cm. Echogenicity within normal limits. No mass or hydronephrosis visualized.  Left Kidney:  Length: 11.7 cm. Echogenicity within normal limits. No mass or hydronephrosis visualized.  Abdominal aorta:  Not well visualized due to overlying bowel gas. Mid abdominal aorta is non aneurysmal.  Other findings:  Ascites.  IMPRESSION: Persistent gallbladder wall thickening and trace dependent sludge which could represent cholecystitis or could be related to hypoproteinemia or secondary edema related to pancreatitis as per the previously seen findings.  Hepatic steatosis  reidentified.   Electronically Signed   By: Conchita Paris M.D.   On: 11/18/2013 08:52   US Abdomen Limited  11/18/2013   CLINICAL DATA:  Ascites  EXAM: LIMITED ABDOMEN ULTRASOUND FOR ASCITES  TECHNIQUE: Limited ultrasound survey for ascites was performed in all four abdominal quadrants.  COMPARISON:  CT abdomen and pelvis 11/15/2013  FINDINGS: Survey imaging of the 4 quadrants was performed.  Small amount of ascites is identified in the right upper quadrant.  No additional significant ascites identified.  Visualized ascites in the perihepatic region is new since the prior CT.  IMPRESSION: Small amount of perihepatic ascites in right upper quadrant new since 11/15/2013.   Electronically Signed   By: Lavonia Dana M.D.   On: 11/18/2013 17:01    Anti-infectives: Anti-infectives   None       Assessment/Plan Acute pancreatitis - etiology unclear, possibly related to ART vs triglycerieds Abdominal pain - no evidence of cholecystitis Hypertriglyceridemia  -HIDA with normal filling and emptying -LFT's improving except lipase bumped likely due to starting clears, triglycerides elevated could be cause of pancreatitis as well -Advance diet once pain is improved and lipase improved -Ambulate and IS  -SCD's and on lovenox  -Would ask ID to eval anti-retroviral medications to see if they need adjusting to avoid pancreatitis in the future -No surgery indicated, will sign off, call with questions/concerns    LOS: 5 days    [name] 11/20/2013, 7:54 AM Pager: [phone]

## 2013-11-20 NOTE — Progress Notes (Signed)
[facility] PROGRESS NOTE  [name] TOI:[id number] DOB: 27-Aug-1945 DOA: 11/15/2013 PCP: Penni Homans, MD  Assessment/Plan:  HIV -Per patient disease continue patient on ART treatment. -Patient will be restarted on ART as an outpatient. Patient to followup with Dr. Megan Salon  Pancreatitis -Most likely secondary to patient's HIV + antiretrovirals treatment. -4/18 Antiretrovirals on hold, patient's lipase has trended down to 73 from a high of 1366 upon admission -4/19 patient's lipase increased to 171 with increased diet -4/19 n.p.o. with sips of water or ginger ale secondary to increasing lipase  -Continue to monitor lipase  Cholecystitis -HIDA scan; showed negative cholecystitis/cholelithiasis  -Surgery has signed off   Diabetes type 2 -Hemoglobin A1c/16/2015= 6.4 -Continue SSI moderate which has been controlled patient's CBG  HTN -Within AHA guidelines -Continue medication unchanged  HLD -Lipitor has been stopped secondary to its interaction with antiretrovirals and possibly exacerbating his pancreatitis -lipid panel; triglycerides= 264, HDL 18, LDL 39. Will need to be addressed once patient's acute illness resolves   Diarrhea -obtain fecal lactoferrin -Obtain GI pathogen panel by PCR which includes Cryptosporidium -Obtain CMV PCR (of stool) -Obtain occult blood smear   Code Status: Full Family Communication: None at bedside Disposition Plan: Per surgery/infectious disease      Consultants: Dr. Carlyle Basques (infectious disease) Dr. Donnie Mesa (surgery)   Procedures: HIDA Scan results 11/19/2013 Study within normal limits.  Ultrasound abdomen complete 11/17/2013 -Persistent gallbladder wall thickening and trace dependent sludge could represent cholecystitis or could be related to hypoproteinemia or secondary edema related to pancreatitis   -Hepatic steatosis reidentified.  CT of the pelvis with contrast 11/15/2013 1. Acute inflammatory process  centered around the tail of the pancreas most likely representing acute pancreatitis.  2. Ileus of the jejunum.  3. Diffuse steatosis of the liver.      Antibiotics:    HPI/Subjective: [name] is a 68 y.o BM PMHx CAD status post CABG, diabetes mellitus, hyperlipidemia, HIV presented [facility] ER at [facility] with complaints of abdominal pain. Patient states his abdominal pain started off in the morning which was mostly in the epigastric area radiating to back stabbing in nature. Has had multiple episodes of nausea and vomiting denies any blood in the vomitus. In the ER patient was found to have elevated lipase and CT abdomen and pelvis shows features concerning for acute pancreatitis. Patient denies drinking alcohol and CAT scan does not show any features concerning for gallstones. Patient otherwise denies any chest pain or shortness of breath fever chills. 4/18 states high scan completed this a.m., currently able to consume medication clear liquids with only mild nausea at times. 4/19 states with advance diet (full liquids) his diarrhea returned described as watery, plus nausea, negative vomiting.  Objective: Filed Vitals:   11/20/13 0539 11/20/13 0651 11/20/13 0935 11/20/13 1257  BP: 143/75  139/71 131/73  Pulse: 95  95 87  Temp: 99.3 F (37.4 C)   99.4 F (37.4 C)  TempSrc: Oral   Oral  Resp: 16   15  Height:      Weight:  54.658 kg (120 lb 8 oz)    SpO2: 95%   100%    Intake/Output Summary (Last 24 hours) at 11/20/13 1733 Last data filed at 11/20/13 1100  Gross per 24 hour  Intake    720 ml  Output      0 ml  Net    720 ml   Filed Weights   11/18/13 0526 11/19/13 8338 11/20/13 2505  Weight: 57.788 kg (127 lb 6.4 oz) 55.475 kg (122 lb 4.8 oz) 54.658 kg (120 lb 8 oz)    Exam:   General:  A./O. x4, NAD  Cardiovascular: Regular rhythm and rate, negative murmurs rubs or gallops  Respiratory: Clear to auscultation bilateral  Abdomen: Distended,  tender to  palpation diffusely ,  positive bowel sounds  Musculoskeletal: Cachectic, negative pedal edema   Data Reviewed: Basic Metabolic Panel:  Recent Labs Lab 11/16/13 0132 11/17/13 0406 11/18/13 0622 11/19/13 0820 11/20/13 0743  NA 139 139 135* 139 140  K 3.4* 4.0 3.5* 4.5 3.5*  CL 99 106 101 103 103  CO2 21 19 19 19  17*  GLUCOSE 216* 125* 153* 168* 148*  BUN 12 11 13 21 16   CREATININE 0.63 0.63 0.62 0.73 0.60  CALCIUM 8.3* 7.8* 8.5 8.4 8.3*  MG  --  1.9  --   --   --    Liver Function Tests:  Recent Labs Lab 11/15/13 1535 11/16/13 0132 11/18/13 0622 11/19/13 0820 11/20/13 0743  AST 40* 40* 35 29 37  ALT 43 40 21 20 24   ALKPHOS 54 47 50 59 85  BILITOT 0.7 1.0 1.9* 1.8* 1.3*  PROT 7.2 6.5 5.9* 5.8* 5.8*  ALBUMIN 4.7 3.9 2.4* 2.2* 2.2*    Recent Labs Lab 11/16/13 0132 11/17/13 0406 11/18/13 0622 11/19/13 0820 11/20/13 0743  LIPASE 1366* 174* 72* 73* 171*  AMYLASE  --   --   --  141*  --    No results found for this basename: AMMONIA,  in the last 168 hours CBC:  Recent Labs Lab 11/15/13 1535 11/16/13 0132 11/19/13 0820 11/20/13 0743  WBC 10.4 10.3 8.5 8.6  NEUTROABS 7.8* 9.4*  --  7.0  HGB 14.5 13.8 12.7* 11.2*  HCT 38.6* 37.4* 34.8* 31.8*  MCV 118.0* 116.1* 116.8* 116.9*  PLT 248 169 129* 133*   Cardiac Enzymes:  Recent Labs Lab 11/16/13 0132  TROPONINI <0.30   BNP (last 3 results) No results found for this basename: PROBNP,  in the last 8760 hours CBG:  Recent Labs Lab 11/19/13 1656 11/19/13 2131 11/20/13 0806 11/20/13 1156 11/20/13 1654  GLUCAP 107* 163* 149* 206* 163*    No results found for this or any previous visit (from the past 240 hour(s)).   Studies: Nm Hepatobiliary  11/19/2013   CLINICAL DATA:  Abdominal pain  EXAM: NUCLEAR MEDICINE HEPATOBILIARY IMAGING  Views:  Anterior, right lateral right upper quadrant  Radionuclide: Technetium 26m Choletec  Dose:  5.0 mCi  Route of administration: Intravenous  COMPARISON:  None.   FINDINGS: Liver uptake of radiotracer is normal. There is prompt visualization of gallbladder and small bowel, indicating patency of the cystic and common bile ducts.  IMPRESSION: Study within normal limits.   Electronically Signed   By: Lowella Grip M.D.   On: 11/19/2013 15:05    Scheduled Meds: . amLODipine  2.5 mg Oral Daily  . atenolol  50 mg Oral Daily  . enoxaparin (LOVENOX) injection  40 mg Subcutaneous Daily  . insulin aspart  0-15 Units Subcutaneous TID WC  . insulin aspart  0-5 Units Subcutaneous QHS   Continuous Infusions:   Principal Problem:   Acute pancreatitis Active Problems:   HIV INFECTION   DIABETES MELLITUS, TYPE II   HYPERTENSION   CORONARY ARTERY DISEASE   GERD   Ascites   HLD (hyperlipidemia)   Cholecystitis, acute   Diarrhea    Time spent: 40 minutes   Vicente Serene  [facility] Pager [phone]. If 7PM-7AM, please contact night-coverage at [email or web address], password [facility] 11/20/2013, 5:33 PM  LOS: 5 days

## 2013-11-21 DIAGNOSIS — K219 Gastro-esophageal reflux disease without esophagitis: Secondary | ICD-10-CM

## 2013-11-21 LAB — GI PATHOGEN PANEL BY PCR, STOOL
C DIFFICILE TOXIN A/B: NEGATIVE
CAMPYLOBACTER BY PCR: NEGATIVE
Cryptosporidium by PCR: NEGATIVE
E COLI 0157 BY PCR: NEGATIVE
E coli (ETEC) LT/ST: NEGATIVE
E coli (STEC): NEGATIVE
G lamblia by PCR: NEGATIVE
Norovirus GI/GII: NEGATIVE
ROTAVIRUS A BY PCR: NEGATIVE
Salmonella by PCR: NEGATIVE
Shigella by PCR: NEGATIVE

## 2013-11-21 LAB — GLUCOSE, CAPILLARY
GLUCOSE-CAPILLARY: 153 mg/dL — AB (ref 70–99)
Glucose-Capillary: 179 mg/dL — ABNORMAL HIGH (ref 70–99)
Glucose-Capillary: 167 mg/dL — ABNORMAL HIGH (ref 70–99)
Glucose-Capillary: 161 mg/dL — ABNORMAL HIGH (ref 70–99)

## 2013-11-21 MED ORDER — POTASSIUM CHLORIDE CRYS ER 20 MEQ PO TBCR
40.0000 meq | EXTENDED_RELEASE_TABLET | Freq: Once | ORAL | Status: AC
  Administered 2013-11-21: 40 meq via ORAL
  Filled 2013-11-21: qty 2

## 2013-11-21 NOTE — Progress Notes (Addendum)
[facility]                                                                              Patient Demographics  [name], is a 68 y.o. male, DOB - 1946/06/04, [id number]  Admit date - 11/15/2013   Admitting Physician Rise Patience, MD  Outpatient Primary MD for the patient is Penni Homans, MD  LOS - 6   Chief Complaint  Patient presents with  . Emesis      Interim history 68 year old male with history of CAD status post CABG, diabetes mellitus, hyperlipidemia, HIV presented for complaints of abdominal pain. Patient was noted to have an elevated lipase upon admission and CT of the abdomen and pelvis showed acute pancreatitis. Surgery was consulted as well as infectious disease. Patient currently improving, surgery has signed off and stated patient's diet is to be advanced.  Assessment & Plan   Acute pancreatitis -Likely secondary to HIV antiviral treatment -Antivirals on hold, lipase trended down from 1366 upon admission to 73 -Once patient restarted diet, lipase increased to 171 -Currently on clear liquid diet -Surgery was consulted and signed off  Cholecystitis -HIDA skin negative for cholecystitis/cholelithiasis -Surgery signed off  HIV -Antivirals on hold -Patient will restart therapy as an outpatient and will follow Dr. Megan Salon  Diabetes mellitus type 2 -Hemoglobin A1c 6.4 -Continue insulin sliding scale with CBG monitoring  Hypertension -Stable -Continue amlodipine, atenolol  Hyperlipidemia -Lipitor held secondary to interaction with antiretrovirals and possible pancreatitis exacerbation -Lipid panel: HDL 18, LDL 39, triglycerides 264 -Will need to be addressed by primary care physician once acute illness resolves  Diarrhea -Currently pending a CMV PCR and other GI pathogen panel including Cryptosporidium -Has improved  Hypokalemia -Will replace and continue to monitor BMP  Code Status: Full  Family Communication: None at  bedside  Disposition Plan: Admitted  Time Spent in minutes   30 minutes  Procedures  HIDA Scan results 11/19/2013  Study within normal limits.   Ultrasound abdomen complete 11/17/2013  -Persistent gallbladder wall thickening and trace dependent sludge could represent cholecystitis or could be related to hypoproteinemia or secondary edema related to pancreatitis  -Hepatic steatosis reidentified.   CT of the pelvis with contrast 11/15/2013  1. Acute inflammatory process centered around the tail of the pancreas most likely representing acute pancreatitis.  2. Ileus of the jejunum.  3. Diffuse steatosis of the liver.  Consults   General surgery Infectious disease  DVT Prophylaxis  Lovenox   Lab Results  Component Value Date   PLT 133* 11/20/2013    Medications  Scheduled Meds: . amLODipine  2.5 mg Oral Daily  . atenolol  50 mg Oral Daily  . enoxaparin (LOVENOX) injection  40 mg Subcutaneous Daily  . insulin aspart  0-15 Units Subcutaneous TID WC  . insulin aspart  0-5 Units Subcutaneous QHS   Continuous Infusions:  PRN Meds:.acetaminophen, acetaminophen, hydrALAZINE, HYDROmorphone (DILAUDID) injection, ondansetron (ZOFRAN) IV, ondansetron, promethazine, triamcinolone cream  Antibiotics   Anti-infectives   None      Subjective:   [name] seen and examined today.  Patient states he is no longer having diarrhea, his bowel movements have been more formed and no liquid.  He would like to advance his diet.  He continues to have some abdominal pain but not as it was when he first came to the hospital.   Objective:   Filed Vitals:   11/20/13 1257 11/20/13 2214 11/21/13 0526 11/21/13 0700  BP: 131/73 132/68  134/68  Pulse: 87 85  87  Temp: 99.4 F (37.4 C) 98.9 F (37.2 C)  98.6 F (37 C)  TempSrc: Oral Oral  Oral  Resp: 15 16  15   Height:      Weight:   53.298 kg (117 lb 8 oz)   SpO2: 100% 99%  100%    Wt Readings from Last 3 Encounters:  11/21/13 53.298 kg  (117 lb 8 oz)  10/08/13 56.473 kg (124 lb 8 oz)  10/04/13 55.792 kg (123 lb)     Intake/Output Summary (Last 24 hours) at 11/21/13 0831 Last data filed at 11/20/13 1852  Gross per 24 hour  Intake    600 ml  Output      0 ml  Net    600 ml    Exam  General: Well developed, well nourished, NAD, appears stated age  HEENT: NCAT, PERRLA, EOMI, Anicteic Sclera, mucous membranes moist.   Neck: Supple, no JVD, no masses  Cardiovascular: S1 S2 auscultated, Regular rate and rhythm.  Respiratory: Clear to auscultation bilaterally with equal chest rise  Abdomen: Soft, nontender, nondistended, + bowel sounds  Extremities: warm dry without cyanosis clubbing or edema  Neuro: AAOx3, cranial nerves grossly intact. Strength 5/5 in patient's upper and lower extremities bilaterally  Skin: Without rashes exudates or nodules  Psych: Normal affect and demeanor with intact judgement and insight  Data Review   Micro Results No results found for this or any previous visit (from the past 240 hour(s)).  Radiology Reports Nm Hepatobiliary  11/19/2013   CLINICAL DATA:  Abdominal pain  EXAM: NUCLEAR MEDICINE HEPATOBILIARY IMAGING  Views:  Anterior, right lateral right upper quadrant  Radionuclide: Technetium 3m Choletec  Dose:  5.0 mCi  Route of administration: Intravenous  COMPARISON:  None.  FINDINGS: Liver uptake of radiotracer is normal. There is prompt visualization of gallbladder and small bowel, indicating patency of the cystic and common bile ducts.  IMPRESSION: Study within normal limits.   Electronically Signed   By: Lowella Grip M.D.   On: 11/19/2013 15:05   US Abdomen Complete  11/18/2013   CLINICAL DATA:  Pancreatitis, abdominal distention  EXAM: ULTRASOUND ABDOMEN COMPLETE  COMPARISON:  CT ABD/PELVIS W CM dated 11/15/2013  FINDINGS: Gallbladder:  Gallbladder wall thickening is reidentified without shadowing stone. Minimal dependent sludge identified. No sonographic Murphy sign.  Gallbladder wall thickness 4 mm.  Common bile duct:  Diameter: 7 mm  Liver:  Diffusely increased in echogenicity compatible is steatosis with an area of focal sparing adjacent to the gallbladder fossa reidentified. No intrahepatic ductal dilatation.  IVC:  Not well visualized due to hepatic shadowing and bowel gas.  Pancreas:  Not well visualized due to bowel gas.  Spleen:  Size and appearance within normal limits.  Right Kidney:  Length: 10.4 cm. Echogenicity within normal limits. No mass or hydronephrosis visualized.  Left Kidney:  Length: 11.7 cm. Echogenicity within normal limits. No mass or hydronephrosis visualized.  Abdominal aorta:  Not well visualized due to overlying bowel gas. Mid abdominal aorta is non aneurysmal.  Other findings:  Ascites.  IMPRESSION: Persistent gallbladder wall thickening and trace dependent sludge which could represent cholecystitis or could be related to hypoproteinemia or  secondary edema related to pancreatitis as per the previously seen findings.  Hepatic steatosis reidentified.   Electronically Signed   By: Conchita Paris M.D.   On: 11/18/2013 08:52   Ct Abdomen Pelvis W Contrast  11/15/2013   CLINICAL DATA:  Abdominal pain, nausea and vomiting.  EXAM: CT ABDOMEN AND PELVIS WITH CONTRAST  TECHNIQUE: Multidetector CT imaging of the abdomen and pelvis was performed using the standard protocol following bolus administration of intravenous contrast.  CONTRAST:  64mL OMNIPAQUE IOHEXOL 300 MG/ML SOLN, 14mL OMNIPAQUE IOHEXOL 300 MG/ML SOLN  COMPARISON:  None.  FINDINGS: Acute inflammatory processes centered in the retroperitoneum and abuts the tail of the pancreas as well as the stomach, spleen and pericolic region on the left. The findings most likely reflect acute pancreatitis. Correlation suggested with amylase and lipase levels.  No associated pancreatic necrosis or focal abscess is identified. There is no evidence of pancreatic mass or calcification. The liver demonstrates  diffuse steatosis without cirrhotic changes. No biliary ductal dilatation is seen. The gallbladder, spleen, adrenal glands and kidneys are unremarkable.  There is associated ileus involving proximal jejunum which shows mild dilatation. No free air is identified. There are no focal masses or enlarged lymph nodes. No vascular abnormalities are identified. The portal vein, splenic vein and superior mesenteric vein show normal patency. The bladder is unremarkable. No hernias are identified. Bony structures are within normal limits.  IMPRESSION: 1. Acute inflammatory process centered around the tail of the pancreas most likely representing acute pancreatitis. No associated pancreatic necrosis or abnormal fluid collections are identified. 2. Ileus of the jejunum. 3. Diffuse steatosis of the liver.   Electronically Signed   By: Aletta Edouard M.D.   On: 11/15/2013 18:30   US Abdomen Limited  11/18/2013   CLINICAL DATA:  Ascites  EXAM: LIMITED ABDOMEN ULTRASOUND FOR ASCITES  TECHNIQUE: Limited ultrasound survey for ascites was performed in all four abdominal quadrants.  COMPARISON:  CT abdomen and pelvis 11/15/2013  FINDINGS: Survey imaging of the 4 quadrants was performed.  Small amount of ascites is identified in the right upper quadrant.  No additional significant ascites identified.  Visualized ascites in the perihepatic region is new since the prior CT.  IMPRESSION: Small amount of perihepatic ascites in right upper quadrant new since 11/15/2013.   Electronically Signed   By: Lavonia Dana M.D.   On: 11/18/2013 17:01    CBC  Recent Labs Lab 11/15/13 1535 11/16/13 0132 11/19/13 0820 11/20/13 0743  WBC 10.4 10.3 8.5 8.6  HGB 14.5 13.8 12.7* 11.2*  HCT 38.6* 37.4* 34.8* 31.8*  PLT 248 169 129* 133*  MCV 118.0* 116.1* 116.8* 116.9*  MCH 44.3* 42.9* 42.6* 41.2*  MCHC 37.6* 36.9* 36.5* 35.2  RDW 13.0 13.6 13.8 13.8  LYMPHSABS 1.5 0.4*  --  0.4*  MONOABS 0.8 0.5  --  1.0  EOSABS 0.3 0.0  --  0.1   BASOSABS 0.0 0.0  --  0.0    Chemistries   Recent Labs Lab 11/15/13 1535 11/16/13 0132 11/17/13 0406 11/18/13 0622 11/19/13 0820 11/20/13 0743  NA 143 139 139 135* 139 140  K 3.3* 3.4* 4.0 3.5* 4.5 3.5*  CL 100 99 106 101 103 103  CO2 23 21 19 19 19  17*  GLUCOSE 244* 216* 125* 153* 168* 148*  BUN 11 12 11 13 21 16   CREATININE 0.80 0.63 0.63 0.62 0.73 0.60  CALCIUM 9.7 8.3* 7.8* 8.5 8.4 8.3*  MG  --   --  1.9  --   --   --  AST 40* 40*  --  35 29 37  ALT 43 40  --  21 20 24   ALKPHOS 54 47  --  50 59 85  BILITOT 0.7 1.0  --  1.9* 1.8* 1.3*   ------------------------------------------------------------------------------------------------------------------ estimated creatinine clearance is 69.4 ml/min (by C-G formula based on Cr of 0.6). ------------------------------------------------------------------------------------------------------------------ No results found for this basename: HGBA1C,  in the last 72 hours ------------------------------------------------------------------------------------------------------------------  Recent Labs  11/20/13 0743  CHOL 100  HDL 8*  LDLCALC 39  TRIG 264*  CHOLHDL 12.5   ------------------------------------------------------------------------------------------------------------------ No results found for this basename: TSH, T4TOTAL, FREET3, T3FREE, THYROIDAB,  in the last 72 hours ------------------------------------------------------------------------------------------------------------------ No results found for this basename: VITAMINB12, FOLATE, FERRITIN, TIBC, IRON, RETICCTPCT,  in the last 72 hours  Coagulation profile No results found for this basename: INR, PROTIME,  in the last 168 hours  No results found for this basename: DDIMER,  in the last 72 hours  Cardiac Enzymes  Recent Labs Lab 11/16/13 0132  TROPONINI <0.30    ------------------------------------------------------------------------------------------------------------------ No components found with this basename: POCBNP,     Rakim Moone D.O. on 11/21/2013 at 8:31 AM  Between 7am to 7pm - Pager - [phone]  After 7pm go to [email or web address] - password [email or web address]  And look for the night coverage person covering for me after hours  [facility] Office  [phone]

## 2013-11-22 LAB — GLUCOSE, CAPILLARY
GLUCOSE-CAPILLARY: 137 mg/dL — AB (ref 70–99)
GLUCOSE-CAPILLARY: 200 mg/dL — AB (ref 70–99)
Glucose-Capillary: 155 mg/dL — ABNORMAL HIGH (ref 70–99)
Glucose-Capillary: 166 mg/dL — ABNORMAL HIGH (ref 70–99)

## 2013-11-22 LAB — BASIC METABOLIC PANEL
BUN: 10 mg/dL (ref 6–23)
CO2: 19 mEq/L (ref 19–32)
Calcium: 8.7 mg/dL (ref 8.4–10.5)
Chloride: 101 mEq/L (ref 96–112)
Creatinine, Ser: 0.54 mg/dL (ref 0.50–1.35)
Glucose, Bld: 157 mg/dL — ABNORMAL HIGH (ref 70–99)
Potassium: 3.7 mEq/L (ref 3.7–5.3)
SODIUM: 137 meq/L (ref 137–147)

## 2013-11-22 LAB — FECAL LACTOFERRIN, QUANT: Fecal Lactoferrin: NEGATIVE

## 2013-11-22 LAB — HLA B*5701: HLA B 5701: NEGATIVE

## 2013-11-22 MED ORDER — LISINOPRIL 20 MG PO TABS
20.0000 mg | ORAL_TABLET | Freq: Every day | ORAL | Status: DC
  Administered 2013-11-22 – 2013-11-23 (×2): 20 mg via ORAL
  Filled 2013-11-22 (×2): qty 1

## 2013-11-22 MED ORDER — VALACYCLOVIR HCL 500 MG PO TABS
500.0000 mg | ORAL_TABLET | Freq: Two times a day (BID) | ORAL | Status: DC
  Administered 2013-11-22 – 2013-11-23 (×2): 500 mg via ORAL
  Filled 2013-11-22 (×3): qty 1

## 2013-11-22 MED ORDER — LOPERAMIDE HCL 2 MG PO CAPS
2.0000 mg | ORAL_CAPSULE | ORAL | Status: DC | PRN
  Administered 2013-11-22 (×2): 2 mg via ORAL
  Filled 2013-11-22 (×2): qty 1

## 2013-11-22 NOTE — Progress Notes (Signed)
Chart reviewed.    [facility]                                                                              Patient Demographics  [name], is a 68 y.o. male, DOB - 09-02-1945, [id number]  Admit date - 11/15/2013   Admitting Physician Rise Patience, MD  Outpatient Primary MD for the patient is Penni Homans, MD  LOS - 7   Chief Complaint  Patient presents with  . Emesis      Interim history 68 year old male with history of CAD status post CABG, diabetes mellitus, hyperlipidemia, HIV presented for complaints of abdominal pain. Patient was noted to have an elevated lipase upon admission and CT of the abdomen and pelvis showed acute pancreatitis. Surgery was consulted as well as infectious disease. Patient currently improving, surgery has signed off and stated patient's diet is to be advanced.  Assessment & Plan   Acute pancreatitis Advance to solids. Home tomorrow if stable  Cholecystitis -HIDA skin negative for cholecystitis/cholelithiasis -Surgery signed off  HIV -Antivirals on hold. Resume alternate as outpt per ID -Patient will restart therapy as an outpatient and will follow Dr. Megan Salon  Diabetes mellitus type 2 -Hemoglobin A1c 6.4 -Continue insulin sliding scale with CBG monitoring  Hypertension -Stable -Continue amlodipine, atenolol Resume ACE  Hyperlipidemia -Lipitor held  Diarrhea Chronic per patient. GI path panel negative  Hypokalemia   Code Status: Full  Family Communication: None at bedside  Disposition Plan: Admitted  Time Spent in minutes   30 minutes  Procedures  HIDA Scan results 11/19/2013  Study within normal limits.   Ultrasound abdomen complete 11/17/2013  -Persistent gallbladder wall thickening and trace dependent sludge could represent cholecystitis or could be related to hypoproteinemia or secondary edema related to pancreatitis  -Hepatic steatosis reidentified.   CT of the pelvis with contrast 11/15/2013  1.  Acute inflammatory process centered around the tail of the pancreas most likely representing acute pancreatitis.  2. Ileus of the jejunum.  3. Diffuse steatosis of the liver.  Consults   General surgery Infectious disease  DVT Prophylaxis  Lovenox   Lab Results  Component Value Date   PLT 133* 11/20/2013    Medications  Scheduled Meds: . amLODipine  2.5 mg Oral Daily  . atenolol  50 mg Oral Daily  . enoxaparin (LOVENOX) injection  40 mg Subcutaneous Daily  . insulin aspart  0-15 Units Subcutaneous TID WC  . insulin aspart  0-5 Units Subcutaneous QHS  . lisinopril  20 mg Oral Daily   Continuous Infusions:  PRN Meds:.acetaminophen, acetaminophen, hydrALAZINE, HYDROmorphone (DILAUDID) injection, ondansetron (ZOFRAN) IV, ondansetron, promethazine, triamcinolone cream  Antibiotics   Anti-infectives   None      Subjective:  Feels better. Hungry. Pain improved. tol fulls  Objective:   Filed Vitals:   11/22/13 0553 11/22/13 0855 11/22/13 1124 11/22/13 1422  BP: 143/80 140/76 147/79 136/72  Pulse: 90 88 68 79  Temp: 98.6 F (37 C) 98.3 F (36.8 C) 94.6 F (34.8 C) 98.3 F (36.8 C)  TempSrc: Oral Oral Oral Oral  Resp: 18 16 16 18   Height:      Weight: 51.211 kg (112 lb 14.4 oz)  SpO2: 97% 100% 100% 100%    Wt Readings from Last 3 Encounters:  11/22/13 51.211 kg (112 lb 14.4 oz)  10/08/13 56.473 kg (124 lb 8 oz)  10/04/13 55.792 kg (123 lb)     Intake/Output Summary (Last 24 hours) at 11/22/13 1550 Last data filed at 11/22/13 0900  Gross per 24 hour  Intake   1669 ml  Output    200 ml  Net   1469 ml    Exam  General: comfortable in chair  HEENT: NCAT, PERRLA, EOMI, Anicteic Sclera, mucous membranes moist.     Cardiovascular: S1 S2 auscultated, Regular rate and rhythm.  Respiratory: Clear to auscultation bilaterally with equal chest rise  Abdomen: Soft, nontender, nondistended, + bowel sounds  Extremities: warm dry without cyanosis clubbing  or edema  Neuro: AAOx3, cranial nerves grossly intact. Strength 5/5 in patient's upper and lower extremities bilaterally  Data Review   Micro Results No results found for this or any previous visit (from the past 240 hour(s)).  Radiology Reports Nm Hepatobiliary  11/19/2013   CLINICAL DATA:  Abdominal pain  EXAM: NUCLEAR MEDICINE HEPATOBILIARY IMAGING  Views:  Anterior, right lateral right upper quadrant  Radionuclide: Technetium 39m Choletec  Dose:  5.0 mCi  Route of administration: Intravenous  COMPARISON:  None.  FINDINGS: Liver uptake of radiotracer is normal. There is prompt visualization of gallbladder and small bowel, indicating patency of the cystic and common bile ducts.  IMPRESSION: Study within normal limits.   Electronically Signed   By: Lowella Grip M.D.   On: 11/19/2013 15:05   US Abdomen Complete  11/18/2013   CLINICAL DATA:  Pancreatitis, abdominal distention  EXAM: ULTRASOUND ABDOMEN COMPLETE  COMPARISON:  CT ABD/PELVIS W CM dated 11/15/2013  FINDINGS: Gallbladder:  Gallbladder wall thickening is reidentified without shadowing stone. Minimal dependent sludge identified. No sonographic Murphy sign. Gallbladder wall thickness 4 mm.  Common bile duct:  Diameter: 7 mm  Liver:  Diffusely increased in echogenicity compatible is steatosis with an area of focal sparing adjacent to the gallbladder fossa reidentified. No intrahepatic ductal dilatation.  IVC:  Not well visualized due to hepatic shadowing and bowel gas.  Pancreas:  Not well visualized due to bowel gas.  Spleen:  Size and appearance within normal limits.  Right Kidney:  Length: 10.4 cm. Echogenicity within normal limits. No mass or hydronephrosis visualized.  Left Kidney:  Length: 11.7 cm. Echogenicity within normal limits. No mass or hydronephrosis visualized.  Abdominal aorta:  Not well visualized due to overlying bowel gas. Mid abdominal aorta is non aneurysmal.  Other findings:  Ascites.  IMPRESSION: Persistent gallbladder  wall thickening and trace dependent sludge which could represent cholecystitis or could be related to hypoproteinemia or secondary edema related to pancreatitis as per the previously seen findings.  Hepatic steatosis reidentified.   Electronically Signed   By: Conchita Paris M.D.   On: 11/18/2013 08:52   Ct Abdomen Pelvis W Contrast  11/15/2013   CLINICAL DATA:  Abdominal pain, nausea and vomiting.  EXAM: CT ABDOMEN AND PELVIS WITH CONTRAST  TECHNIQUE: Multidetector CT imaging of the abdomen and pelvis was performed using the standard protocol following bolus administration of intravenous contrast.  CONTRAST:  34mL OMNIPAQUE IOHEXOL 300 MG/ML SOLN, 149mL OMNIPAQUE IOHEXOL 300 MG/ML SOLN  COMPARISON:  None.  FINDINGS: Acute inflammatory processes centered in the retroperitoneum and abuts the tail of the pancreas as well as the stomach, spleen and pericolic region on the left. The findings most likely reflect acute  pancreatitis. Correlation suggested with amylase and lipase levels.  No associated pancreatic necrosis or focal abscess is identified. There is no evidence of pancreatic mass or calcification. The liver demonstrates diffuse steatosis without cirrhotic changes. No biliary ductal dilatation is seen. The gallbladder, spleen, adrenal glands and kidneys are unremarkable.  There is associated ileus involving proximal jejunum which shows mild dilatation. No free air is identified. There are no focal masses or enlarged lymph nodes. No vascular abnormalities are identified. The portal vein, splenic vein and superior mesenteric vein show normal patency. The bladder is unremarkable. No hernias are identified. Bony structures are within normal limits.  IMPRESSION: 1. Acute inflammatory process centered around the tail of the pancreas most likely representing acute pancreatitis. No associated pancreatic necrosis or abnormal fluid collections are identified. 2. Ileus of the jejunum. 3. Diffuse steatosis of the liver.    Electronically Signed   By: Aletta Edouard M.D.   On: 11/15/2013 18:30   US Abdomen Limited  11/18/2013   CLINICAL DATA:  Ascites  EXAM: LIMITED ABDOMEN ULTRASOUND FOR ASCITES  TECHNIQUE: Limited ultrasound survey for ascites was performed in all four abdominal quadrants.  COMPARISON:  CT abdomen and pelvis 11/15/2013  FINDINGS: Survey imaging of the 4 quadrants was performed.  Small amount of ascites is identified in the right upper quadrant.  No additional significant ascites identified.  Visualized ascites in the perihepatic region is new since the prior CT.  IMPRESSION: Small amount of perihepatic ascites in right upper quadrant new since 11/15/2013.   Electronically Signed   By: Lavonia Dana M.D.   On: 11/18/2013 17:01    CBC  Recent Labs Lab 11/16/13 0132 11/19/13 0820 11/20/13 0743  WBC 10.3 8.5 8.6  HGB 13.8 12.7* 11.2*  HCT 37.4* 34.8* 31.8*  PLT 169 129* 133*  MCV 116.1* 116.8* 116.9*  MCH 42.9* 42.6* 41.2*  MCHC 36.9* 36.5* 35.2  RDW 13.6 13.8 13.8  LYMPHSABS 0.4*  --  0.4*  MONOABS 0.5  --  1.0  EOSABS 0.0  --  0.1  BASOSABS 0.0  --  0.0    Chemistries   Recent Labs Lab 11/16/13 0132 11/17/13 0406 11/18/13 0622 11/19/13 0820 11/20/13 0743 11/22/13 0447  NA 139 139 135* 139 140 137  K 3.4* 4.0 3.5* 4.5 3.5* 3.7  CL 99 106 101 103 103 101  CO2 21 19 19 19  17* 19  GLUCOSE 216* 125* 153* 168* 148* 157*  BUN 12 11 13 21 16 10   CREATININE 0.63 0.63 0.62 0.73 0.60 0.54  CALCIUM 8.3* 7.8* 8.5 8.4 8.3* 8.7  MG  --  1.9  --   --   --   --   AST 40*  --  35 29 37  --   ALT 40  --  21 20 24   --   ALKPHOS 47  --  50 59 85  --   BILITOT 1.0  --  1.9* 1.8* 1.3*  --    ------------------------------------------------------------------------------------------------------------------ estimated creatinine clearance is 66.7 ml/min (by C-G formula based on Cr of  0.54). ------------------------------------------------------------------------------------------------------------------ No results found for this basename: HGBA1C,  in the last 72 hours ------------------------------------------------------------------------------------------------------------------  Recent Labs  11/20/13 0743  CHOL 100  HDL 8*  LDLCALC 39  TRIG 264*  CHOLHDL 12.5   ------------------------------------------------------------------------------------------------------------------ No results found for this basename: TSH, T4TOTAL, FREET3, T3FREE, THYROIDAB,  in the last 72 hours ------------------------------------------------------------------------------------------------------------------ No results found for this basename: VITAMINB12, FOLATE, FERRITIN, TIBC, IRON, RETICCTPCT,  in the last  72 hours  Coagulation profile No results found for this basename: INR, PROTIME,  in the last 168 hours  No results found for this basename: DDIMER,  in the last 72 hours  Cardiac Enzymes  Recent Labs Lab 11/16/13 0132  TROPONINI <0.30   ------------------------------------------------------------------------------------------------------------------ No components found with this basename: POCBNP,   Delfina Redwood MD on 11/22/2013 at 3:50 PM [facility]

## 2013-11-23 ENCOUNTER — Other Ambulatory Visit: Payer: Self-pay | Admitting: Family Medicine

## 2013-11-23 LAB — GLUCOSE, CAPILLARY: Glucose-Capillary: 172 mg/dL — ABNORMAL HIGH (ref 70–99)

## 2013-11-23 LAB — LIPASE, BLOOD: Lipase: 217 U/L — ABNORMAL HIGH (ref 11–59)

## 2013-11-23 MED ORDER — HYDROCODONE-ACETAMINOPHEN 5-325 MG PO TABS: 1.0000 | ORAL_TABLET | Freq: Four times a day (QID) | ORAL | Status: DC | PRN

## 2013-11-23 MED ORDER — PROMETHAZINE HCL 12.5 MG PO TABS: 12.5000 mg | ORAL_TABLET | Freq: Four times a day (QID) | ORAL | Status: DC | PRN

## 2013-11-23 NOTE — Discharge Summary (Signed)
Physician Discharge Summary  [name]:[id number] DOB: 11-Sep-1945 DOA: 11/15/2013  PCP: Penni Homans, MD  Admit date: 11/15/2013 Discharge date: 11/23/2013  Time spent: greater than 30 minutes  Recommendations for Outpatient Follow-up:  1. Resume ART per ID at follow up visit  Discharge Diagnoses:  Principal Problem:   Acute pancreatitis, likely due to ART Active Problems:   HIV INFECTION   DIABETES MELLITUS, TYPE II   HYPERTENSION   CORONARY ARTERY DISEASE   GERD   HLD (hyperlipidemia)   Diarrhea, chronic  Discharge Condition: stable  Filed Weights   11/21/13 0526 11/22/13 0553 11/23/13 0548  Weight: 53.298 kg (117 lb 8 oz) 51.211 kg (112 lb 14.4 oz) 51.222 kg (112 lb 14.8 oz)    History of present illness:  68 y.o. male history of CAD status post CABG, diabetes mellitus, hyperlipidemia, HIV presented to the ER at [facility] with complaints of abdominal pain. Patient states his abdominal pain started off in the morning which was mostly in the epigastric area radiating to back stabbing in nature. Has had multiple episodes of nausea and vomiting denies any blood in the vomitus. In the ER patient was found to have elevated lipase and CT abdomen and pelvis shows features concerning for acute pancreatitis. Patient denies drinking alcohol and CAT scan does not show any features concerning for gallstones. Patient otherwise denies any chest pain or shortness of breath fever chills.  Hospital Course:  Acute pancreatitis  Patient denies heavy alcohol use, and triglyceride level not high enough to be etiology -Likely secondary to HIV antiviral treatment  -Antivirals on hold, lipase trended down from 1366 upon admission -Surgery was consulted and signed off, as HIDA negative ID consulted and recommended stopping combivir and viramune, following up in ID clinic to consider stribild Tolerating solid diet with near resolution of pain  HIV  -Antivirals on hold  -Patient  will restart therapy as an outpatient and will follow Dr. Megan Salon   Diabetes mellitus type 2  -Hemoglobin A1c 6.4   Hypertension  -Stable   Hyperlipidemia  Statin resumed at discharge  Diarrhea  Chronic, intermittent GI pathogen panel negative  Hypokalemia  -corrected  Procedures:  none  Consultations:  ID  General surgery  Discharge Exam: Filed Vitals:   11/23/13 0548  BP: 141/79  Pulse: 83  Temp: 98 F (36.7 C)  Resp: 16    General: comfortable Cardiovascular: RRR Respiratory: *CTA Abd: s, NT, ND  Discharge Orders   Future Appointments Provider Department Dept Phone   12/15/2013 2:15 PM Mosie Lukes, MD [place] at  [facility] [phone]   01/10/2014 9:45 AM [facility] for Infectious Disease [phone]   01/24/2014 10:00 AM Michel Bickers, MD [facility] for Infectious Disease [phone]   Future Orders Complete By Expires   Activity as tolerated - No restrictions  As directed    Discharge instructions  As directed        Medication List    STOP taking these medications       lamiVUDine-zidovudine 150-300 MG per tablet  Commonly known as:  COMBIVIR     nevirapine 200 MG tablet  Commonly known as:  VIRAMUNE      TAKE these medications       amLODipine 2.5 MG tablet  Commonly known as:  NORVASC  Take 1 tablet (2.5 mg total) by mouth daily.     aspirin 81 MG chewable tablet  Commonly known as:  ASPIRIN  CHILDRENS  Chew 1 tablet (81 mg total) by mouth daily.     atenolol 50 MG tablet  Commonly known as:  TENORMIN  Take 50 mg by mouth daily.     atorvastatin 20 MG tablet  Commonly known as:  LIPITOR  Take 20 mg by mouth daily.     b complex vitamins tablet  Take 1 tablet by mouth daily.     fenofibrate 160 MG tablet  Take 160 mg by mouth daily.     FIBER SUPREME PO  Take 10 mLs by mouth daily.     GLUCERNA Liqd  Take 1 Can by mouth 2 (two) times daily between meals.      HYDROcodone-acetaminophen 5-325 MG per tablet  Commonly known as:  NORCO/VICODIN  Take 1 tablet by mouth every 6 (six) hours as needed.     KRILL OIL OMEGA-3 PO  Take 1 capsule by mouth daily.     lisinopril-hydrochlorothiazide 20-12.5 MG per tablet  Commonly known as:  PRINZIDE,ZESTORETIC  Take 2 tablets by mouth daily.     metFORMIN 500 MG tablet  Commonly known as:  GLUCOPHAGE  Take 500 mg by mouth 2 (two) times daily with a meal.     omeprazole 20 MG capsule  Commonly known as:  PRILOSEC  Take 1 capsule (20 mg total) by mouth daily.     promethazine 12.5 MG tablet  Commonly known as:  PHENERGAN  Take 1 tablet (12.5 mg total) by mouth every 6 (six) hours as needed for nausea or vomiting.     triamcinolone cream 0.1 %  Commonly known as:  KENALOG  Apply 1 application topically at bedtime as needed (dry skin).     valACYclovir 500 MG tablet  Commonly known as:  VALTREX  Take 500 mg by mouth 2 (two) times daily.       No Known Allergies     Follow-up Information   Follow up with Michel Bickers, MD In 2 weeks.   Specialty:  Infectious Diseases   Contact information:   [address] [place] [address] [phone]        The results of significant diagnostics from this hospitalization (including imaging, microbiology, ancillary and laboratory) are listed below for reference.    Significant Diagnostic Studies: Nm Hepatobiliary  11/19/2013   CLINICAL DATA:  Abdominal pain  EXAM: NUCLEAR MEDICINE HEPATOBILIARY IMAGING  Views:  Anterior, right lateral right upper quadrant  Radionuclide: Technetium 58m Choletec  Dose:  5.0 mCi  Route of administration: Intravenous  COMPARISON:  None.  FINDINGS: Liver uptake of radiotracer is normal. There is prompt visualization of gallbladder and small bowel, indicating patency of the cystic and common bile ducts.  IMPRESSION: Study within normal limits.   Electronically Signed   By: Lowella Grip M.D.   On:  11/19/2013 15:05   US Abdomen Complete  11/18/2013   CLINICAL DATA:  Pancreatitis, abdominal distention  EXAM: ULTRASOUND ABDOMEN COMPLETE  COMPARISON:  CT ABD/PELVIS W CM dated 11/15/2013  FINDINGS: Gallbladder:  Gallbladder wall thickening is reidentified without shadowing stone. Minimal dependent sludge identified. No sonographic Murphy sign. Gallbladder wall thickness 4 mm.  Common bile duct:  Diameter: 7 mm  Liver:  Diffusely increased in echogenicity compatible is steatosis with an area of focal sparing adjacent to the gallbladder fossa reidentified. No intrahepatic ductal dilatation.  IVC:  Not well visualized due to hepatic shadowing and bowel gas.  Pancreas:  Not well visualized due to bowel gas.  Spleen:  Size and appearance within normal limits.  Right Kidney:  Length: 10.4 cm. Echogenicity within normal limits. No mass or hydronephrosis visualized.  Left Kidney:  Length: 11.7 cm. Echogenicity within normal limits. No mass or hydronephrosis visualized.  Abdominal aorta:  Not well visualized due to overlying bowel gas. Mid abdominal aorta is non aneurysmal.  Other findings:  Ascites.  IMPRESSION: Persistent gallbladder wall thickening and trace dependent sludge which could represent cholecystitis or could be related to hypoproteinemia or secondary edema related to pancreatitis as per the previously seen findings.  Hepatic steatosis reidentified.   Electronically Signed   By: Conchita Paris M.D.   On: 11/18/2013 08:52   Ct Abdomen Pelvis W Contrast  11/15/2013   CLINICAL DATA:  Abdominal pain, nausea and vomiting.  EXAM: CT ABDOMEN AND PELVIS WITH CONTRAST  TECHNIQUE: Multidetector CT imaging of the abdomen and pelvis was performed using the standard protocol following bolus administration of intravenous contrast.  CONTRAST:  29mL OMNIPAQUE IOHEXOL 300 MG/ML SOLN, 123mL OMNIPAQUE IOHEXOL 300 MG/ML SOLN  COMPARISON:  None.  FINDINGS: Acute inflammatory processes centered in the retroperitoneum and  abuts the tail of the pancreas as well as the stomach, spleen and pericolic region on the left. The findings most likely reflect acute pancreatitis. Correlation suggested with amylase and lipase levels.  No associated pancreatic necrosis or focal abscess is identified. There is no evidence of pancreatic mass or calcification. The liver demonstrates diffuse steatosis without cirrhotic changes. No biliary ductal dilatation is seen. The gallbladder, spleen, adrenal glands and kidneys are unremarkable.  There is associated ileus involving proximal jejunum which shows mild dilatation. No free air is identified. There are no focal masses or enlarged lymph nodes. No vascular abnormalities are identified. The portal vein, splenic vein and superior mesenteric vein show normal patency. The bladder is unremarkable. No hernias are identified. Bony structures are within normal limits.  IMPRESSION: 1. Acute inflammatory process centered around the tail of the pancreas most likely representing acute pancreatitis. No associated pancreatic necrosis or abnormal fluid collections are identified. 2. Ileus of the jejunum. 3. Diffuse steatosis of the liver.   Electronically Signed   By: Aletta Edouard M.D.   On: 11/15/2013 18:30   US Abdomen Limited  11/18/2013   CLINICAL DATA:  Ascites  EXAM: LIMITED ABDOMEN ULTRASOUND FOR ASCITES  TECHNIQUE: Limited ultrasound survey for ascites was performed in all four abdominal quadrants.  COMPARISON:  CT abdomen and pelvis 11/15/2013  FINDINGS: Survey imaging of the 4 quadrants was performed.  Small amount of ascites is identified in the right upper quadrant.  No additional significant ascites identified.  Visualized ascites in the perihepatic region is new since the prior CT.  IMPRESSION: Small amount of perihepatic ascites in right upper quadrant new since 11/15/2013.   Electronically Signed   By: Lavonia Dana M.D.   On: 11/18/2013 17:01    Microbiology: No results found for this or any  previous visit (from the past 240 hour(s)).   Labs: Basic Metabolic Panel:  Recent Labs Lab 11/17/13 0406 11/18/13 0622 11/19/13 0820 11/20/13 0743 11/22/13 0447  NA 139 135* 139 140 137  K 4.0 3.5* 4.5 3.5* 3.7  CL 106 101 103 103 101  CO2 19 19 19  17* 19  GLUCOSE 125* 153* 168* 148* 157*  BUN 11 13 21 16 10   CREATININE 0.63 0.62 0.73 0.60 0.54  CALCIUM 7.8* 8.5 8.4 8.3* 8.7  MG 1.9  --   --   --   --  Liver Function Tests:  Recent Labs Lab 11/18/13 0622 11/19/13 0820 11/20/13 0743  AST 35 29 37  ALT 21 20 24   ALKPHOS 50 59 85  BILITOT 1.9* 1.8* 1.3*  PROT 5.9* 5.8* 5.8*  ALBUMIN 2.4* 2.2* 2.2*    Recent Labs Lab 11/17/13 0406 11/18/13 0622 11/19/13 0820 11/20/13 0743 11/23/13 0410  LIPASE 174* 72* 73* 171* 217*  AMYLASE  --   --  141*  --   --    No results found for this basename: AMMONIA,  in the last 168 hours CBC:  Recent Labs Lab 11/19/13 0820 11/20/13 0743  WBC 8.5 8.6  NEUTROABS  --  7.0  HGB 12.7* 11.2*  HCT 34.8* 31.8*  MCV 116.8* 116.9*  PLT 129* 133*   Cardiac Enzymes: No results found for this basename: CKTOTAL, CKMB, CKMBINDEX, TROPONINI,  in the last 168 hours BNP: BNP (last 3 results) No results found for this basename: PROBNP,  in the last 8760 hours CBG:  Recent Labs Lab 11/22/13 0747 11/22/13 1204 11/22/13 1641 11/22/13 2214 11/23/13 0751  GLUCAP 155* 166* 137* 200* 172*       Signed:  Delfina Redwood  [facility] 11/23/2013, 10:03 AM

## 2013-11-23 NOTE — Progress Notes (Signed)
[name] to be D/C'd Home per MD order.  Discussed with the patient and all questions fully answered.    Medication List    STOP taking these medications       lamiVUDine-zidovudine 150-300 MG per tablet  Commonly known as:  COMBIVIR     nevirapine 200 MG tablet  Commonly known as:  VIRAMUNE      TAKE these medications       amLODipine 2.5 MG tablet  Commonly known as:  NORVASC  Take 1 tablet (2.5 mg total) by mouth daily.     aspirin 81 MG chewable tablet  Commonly known as:  ASPIRIN CHILDRENS  Chew 1 tablet (81 mg total) by mouth daily.     atenolol 50 MG tablet  Commonly known as:  TENORMIN  Take 50 mg by mouth daily.     atorvastatin 20 MG tablet  Commonly known as:  LIPITOR  Take 20 mg by mouth daily.     b complex vitamins tablet  Take 1 tablet by mouth daily.     fenofibrate 160 MG tablet  Take 160 mg by mouth daily.     FIBER SUPREME PO  Take 10 mLs by mouth daily.     GLUCERNA Liqd  Take 1 Can by mouth 2 (two) times daily between meals.     HYDROcodone-acetaminophen 5-325 MG per tablet  Commonly known as:  NORCO/VICODIN  Take 1 tablet by mouth every 6 (six) hours as needed.     KRILL OIL OMEGA-3 PO  Take 1 capsule by mouth daily.     lisinopril-hydrochlorothiazide 20-12.5 MG per tablet  Commonly known as:  PRINZIDE,ZESTORETIC  Take 2 tablets by mouth daily.     metFORMIN 500 MG tablet  Commonly known as:  GLUCOPHAGE  Take 500 mg by mouth 2 (two) times daily with a meal.     omeprazole 20 MG capsule  Commonly known as:  PRILOSEC  Take 1 capsule (20 mg total) by mouth daily.     promethazine 12.5 MG tablet  Commonly known as:  PHENERGAN  Take 1 tablet (12.5 mg total) by mouth every 6 (six) hours as needed for nausea or vomiting.     triamcinolone cream 0.1 %  Commonly known as:  KENALOG  Apply 1 application topically at bedtime as needed (dry skin).     valACYclovir 500 MG tablet  Commonly known as:  VALTREX  Take 500 mg by mouth 2  (two) times daily.        VSS, Skin clean, dry and intact without evidence of skin break down, no evidence of skin tears noted. IV catheter discontinued intact. Site without signs and symptoms of complications. Dressing and pressure applied.  An After Visit Summary was printed and given to the patient.  D/c education completed with patient/family including follow up instructions, medication list, d/c activities limitations if indicated, with other d/c instructions as indicated by MD - patient able to verbalize understanding, all questions fully answered.   Patient instructed to return to ED, call 911, or call MD for any changes in condition.   Patient escorted via [place], and D/C home via private auto.  Micki Riley 11/23/2013 10:47 AM

## 2013-11-24 ENCOUNTER — Other Ambulatory Visit: Payer: Self-pay | Admitting: Infectious Disease

## 2013-11-24 DIAGNOSIS — K219 Gastro-esophageal reflux disease without esophagitis: Secondary | ICD-10-CM

## 2013-11-30 ENCOUNTER — Encounter: Payer: Self-pay | Admitting: Family Medicine

## 2013-12-06 DIAGNOSIS — I10 Essential (primary) hypertension: Secondary | ICD-10-CM | POA: Diagnosis not present

## 2013-12-06 DIAGNOSIS — E119 Type 2 diabetes mellitus without complications: Secondary | ICD-10-CM | POA: Diagnosis not present

## 2013-12-06 DIAGNOSIS — E785 Hyperlipidemia, unspecified: Secondary | ICD-10-CM | POA: Diagnosis not present

## 2013-12-06 LAB — CBC
HCT: 31.6 % — ABNORMAL LOW (ref 39.0–52.0)
HEMOGLOBIN: 11.2 g/dL — AB (ref 13.0–17.0)
MCH: 38.6 pg — ABNORMAL HIGH (ref 26.0–34.0)
MCHC: 35.4 g/dL (ref 30.0–36.0)
MCV: 109 fL — ABNORMAL HIGH (ref 78.0–100.0)
Platelets: 231 10*3/uL (ref 150–400)
RBC: 2.9 MIL/uL — ABNORMAL LOW (ref 4.22–5.81)
RDW: 13.7 % (ref 11.5–15.5)
WBC: 4.8 10*3/uL (ref 4.0–10.5)

## 2013-12-06 LAB — HEMOGLOBIN A1C
HEMOGLOBIN A1C: 6.7 % — AB (ref <5.7)
MEAN PLASMA GLUCOSE: 146 mg/dL — AB (ref <117)

## 2013-12-07 ENCOUNTER — Encounter: Payer: Self-pay | Admitting: Family Medicine

## 2013-12-07 LAB — HEPATIC FUNCTION PANEL
ALK PHOS: 56 U/L (ref 39–117)
ALT: 19 U/L (ref 0–53)
AST: 21 U/L (ref 0–37)
Albumin: 3.6 g/dL (ref 3.5–5.2)
BILIRUBIN TOTAL: 0.7 mg/dL (ref 0.2–1.2)
Bilirubin, Direct: 0.2 mg/dL (ref 0.0–0.3)
Indirect Bilirubin: 0.5 mg/dL (ref 0.2–1.2)
Total Protein: 5.8 g/dL — ABNORMAL LOW (ref 6.0–8.3)

## 2013-12-07 LAB — LIPID PANEL
Cholesterol: 91 mg/dL (ref 0–200)
HDL: 24 mg/dL — ABNORMAL LOW (ref >39)
LDL Cholesterol: 46 mg/dL (ref 0–99)
TRIGLYCERIDES: 105 mg/dL (ref <150)
Total CHOL/HDL Ratio: 3.8 Ratio
VLDL: 21 mg/dL (ref 0–40)

## 2013-12-07 LAB — RENAL FUNCTION PANEL
ALBUMIN: 3.6 g/dL (ref 3.5–5.2)
BUN: 17 mg/dL (ref 6–23)
CALCIUM: 8.7 mg/dL (ref 8.4–10.5)
CO2: 24 mEq/L (ref 19–32)
Chloride: 104 mEq/L (ref 96–112)
Creat: 0.7 mg/dL (ref 0.50–1.35)
Glucose, Bld: 114 mg/dL — ABNORMAL HIGH (ref 70–99)
PHOSPHORUS: 3.2 mg/dL (ref 2.3–4.6)
POTASSIUM: 4.1 meq/L (ref 3.5–5.3)
SODIUM: 138 meq/L (ref 135–145)

## 2013-12-07 LAB — TSH: TSH: 1.659 u[IU]/mL (ref 0.350–4.500)

## 2013-12-15 ENCOUNTER — Encounter: Payer: Self-pay | Admitting: Internal Medicine

## 2013-12-15 ENCOUNTER — Other Ambulatory Visit: Payer: Self-pay | Admitting: Licensed Clinical Social Worker

## 2013-12-15 ENCOUNTER — Ambulatory Visit: Payer: Self-pay | Admitting: Family Medicine

## 2013-12-15 ENCOUNTER — Other Ambulatory Visit: Payer: Self-pay | Admitting: *Deleted

## 2013-12-15 ENCOUNTER — Other Ambulatory Visit: Payer: Self-pay | Admitting: Pharmacist

## 2013-12-15 ENCOUNTER — Ambulatory Visit (INDEPENDENT_AMBULATORY_CARE_PROVIDER_SITE_OTHER): Payer: Medicare Other | Admitting: Internal Medicine

## 2013-12-15 VITALS — BP 168/88 | HR 54 | Wt 114.0 lb

## 2013-12-15 DIAGNOSIS — B20 Human immunodeficiency virus [HIV] disease: Secondary | ICD-10-CM

## 2013-12-15 DIAGNOSIS — R197 Diarrhea, unspecified: Secondary | ICD-10-CM

## 2013-12-15 DIAGNOSIS — I2510 Atherosclerotic heart disease of native coronary artery without angina pectoris: Secondary | ICD-10-CM | POA: Diagnosis not present

## 2013-12-15 MED ORDER — ATORVASTATIN CALCIUM 10 MG PO TABS: 10.0000 mg | ORAL_TABLET | Freq: Every day | ORAL | Status: DC

## 2013-12-15 MED ORDER — ATORVASTATIN CALCIUM 10 MG PO TABS: 20.0000 mg | ORAL_TABLET | Freq: Every day | ORAL | Status: DC

## 2013-12-15 MED ORDER — ELVITEG-COBIC-EMTRICIT-TENOFDF 150-150-200-300 MG PO TABS: 1.0000 | ORAL_TABLET | Freq: Every day | ORAL | Status: DC

## 2013-12-15 NOTE — Progress Notes (Signed)
Patient ID: [name], male   DOB: [birth year], 68 y.o.   MRN: [id number]          Patient Active Problem List   Diagnosis Date Noted  . DIABETES MELLITUS, TYPE II 09/03/2006    Priority: High  . HIV INFECTION 05/12/2006    Priority: High  . DYSLIPIDEMIA 05/12/2006    Priority: High  . CORONARY ARTERY DISEASE 05/12/2006    Priority: High  . Diarrhea 11/20/2013  . HLD (hyperlipidemia) 11/19/2013  . Cholecystitis, acute 11/19/2013  . Ascites 11/18/2013  . Acute pancreatitis 11/16/2013  . Pancreatitis 11/15/2013  . Sinusitis 10/08/2013  . Epicondylitis 06/05/2013  . Nocturia 03/06/2013  . Inguinal adenopathy 01/11/2013  . Dermatitis 11/27/2012  . Dry mouth 07/06/2012  . Night sweats 07/06/2012  . Erectile dysfunction 01/01/2012  . Lipodystrophy 12/20/2010  . Dry eye syndrome 12/20/2010  . BELLS PALSY 07/19/2010  . GENITAL HERPES 05/03/2009  . SHINGLES, HX OF 05/03/2009  . WEIGHT LOSS, ABNORMAL 04/03/2009  . INGUINAL LYMPHADENOPATHY, RIGHT 04/03/2009  . MEMORY LOSS 09/18/2008  . PERIPHERAL VASCULAR DISEASE 07/20/2008  . COPD 07/20/2008  . HIP PAIN, BILATERAL 07/17/2008  . KNEE PAIN, BILATERAL 07/17/2008  . HEMATOCHEZIA 04/18/2008  . NEPHROLITHIASIS, HX OF 02/22/2008  . DEPRESSION 09/03/2006  . GERD 09/03/2006  . CORONARY ARTERY BYPASS GRAFT, HX OF 09/03/2006  . HEARING LOSS, SENSORINEURAL 05/12/2006  . HYPERTENSION 05/12/2006    Patient's Medications  New Prescriptions   ELVITEGRAVIR-COBICISTAT-EMTRICITABINE-TENOFOVIR (STRIBILD) 150-150-200-300 MG TABS TABLET    Take 1 tablet by mouth daily with breakfast.  Previous Medications   AMLODIPINE (NORVASC) 2.5 MG TABLET    TAKE 1 TABLET BY MOUTH DAILY   ASPIRIN (ASPIRIN CHILDRENS) 81 MG CHEWABLE TABLET    Chew 1 tablet (81 mg total) by mouth daily.   ATENOLOL (TENORMIN) 50 MG TABLET    TAKE 1 TABLET BY MOUTH EVERY DAY   B COMPLEX VITAMINS TABLET    Take 1 tablet by mouth daily.   FENOFIBRATE 160 MG TABLET    TAKE 1  TABLET BY MOUTH EVERY DAY   GLUCERNA (GLUCERNA) LIQD    Take 1 Can by mouth 2 (two) times daily between meals.   HYDROCODONE-ACETAMINOPHEN (NORCO/VICODIN) 5-325 MG PER TABLET    Take 1 tablet by mouth every 6 (six) hours as needed.   KRILL OIL OMEGA-3 PO    Take 1 capsule by mouth daily.   LISINOPRIL-HYDROCHLOROTHIAZIDE (PRINZIDE,ZESTORETIC) 20-12.5 MG PER TABLET    TAKE 2 TABLETS BY MOUTH DAILY   METFORMIN (GLUCOPHAGE) 500 MG TABLET    TAKE 1 TABLET BY MOUTH TWICE DAILY WITH A MEAL   MISC NATURAL PRODUCTS (FIBER SUPREME PO)    Take 10 mLs by mouth daily.   OMEPRAZOLE (PRILOSEC) 20 MG CAPSULE    TAKE 1 CAPSULE BY MOUTH EVERY DAY   VALACYCLOVIR (VALTREX) 500 MG TABLET    Take 500 mg by mouth 2 (two) times daily.  Modified Medications   Modified Medication Previous Medication   ATORVASTATIN (LIPITOR) 10 MG TABLET atorvastatin (LIPITOR) 20 MG tablet      Take 2 tablets (20 mg total) by mouth daily.    Take 20 mg by mouth daily.  Discontinued Medications   PROMETHAZINE (PHENERGAN) 12.5 MG TABLET    Take 1 tablet (12.5 mg total) by mouth every 6 (six) hours as needed for nausea or vomiting.   TRIAMCINOLONE CREAM (KENALOG) 0.1 %    Apply 1 application topically at bedtime as needed (dry skin).  Subjective: Dwan is in for his hospital followup visit.  He was hospitalized last month with acute pancreatitis. The cause for his pancreatitis was not obvious. A cause of concern that it might have been related to Combivir or Viramune his HIV medications were stopped. He is feeling gradually better. He is still having some night sweats and diarrhea however. He does not believe he is having any fever. His appetite is improving slowly and he has gained a few pounds. Review of Systems: Constitutional: positive for night sweats and weight loss, negative for anorexia, chills and fevers Eyes: negative Ears, nose, mouth, throat, and face: negative Respiratory: negative Cardiovascular:  negative Gastrointestinal: positive for diarrhea, negative for abdominal pain, constipation and dyspepsia Genitourinary:negative  Past Medical History  Diagnosis Date  . Arthritis   . Diabetes mellitus   . History of depression   . GERD (gastroesophageal reflux disease)   . CAD (coronary artery disease)     2003- cabg  . Hypertension   . Hyperlipidemia   . History of kidney stones   . COPD (chronic obstructive pulmonary disease)   . HIV (human immunodeficiency virus infection)   . Dermatitis 11/27/2012  . Nocturia 03/06/2013  . Epicondylitis 06/05/2013    right    History  Substance Use Topics  . Smoking status: Never Smoker   . Smokeless tobacco: Never Used  . Alcohol Use: No    Family History  Problem Relation Age of Onset  . Arthritis      mother/father/paternal grandparents  . Breast cancer Maternal Aunt     paternal aunt  . Lung cancer Maternal Aunt   . Hyperlipidemia Mother   . Hyperlipidemia Father   . Heart disease      parents/maternal grandparents/ 2 brothers  . Stroke Paternal Grandmother   . Hypertension Father     paternal grandmother/3 brothers/1 sister  . Mental retardation Sister   . Diabetes Mother     paternal grandparents/1 brother    No Known Allergies  Objective: BP: 168/88 mmHg (05/14 0927) Pulse Rate: 54 (05/14 0927) Body mass index is 20.2 kg/(m^2).  General:  His weight is up 2 pounds to 114. This is still well below his baseline weight. He is in good spirits Oral:  no oropharyngeal lesions Skin:  a rash  Lungs:  clear  Cor:  regular S1 and S2 with no murmurs Abdomen: Soft and nontender with quiet bowel sounds  Lab Results Lab Results  Component Value Date   WBC 4.8 12/06/2013   HGB 11.2* 12/06/2013   HCT 31.6* 12/06/2013   MCV 109.0* 12/06/2013   PLT 231 12/06/2013    Lab Results  Component Value Date   CREATININE 0.70 12/06/2013   BUN 17 12/06/2013   NA 138 12/06/2013   K 4.1 12/06/2013   CL 104 12/06/2013   CO2 24 12/06/2013    Lab  Results  Component Value Date   ALT 19 12/06/2013   AST 21 12/06/2013   ALKPHOS 56 12/06/2013   BILITOT 0.7 12/06/2013    Lab Results  Component Value Date   CHOL 91 12/06/2013   HDL 24* 12/06/2013   LDLCALC 46 12/06/2013   LDLDIRECT 68 05/04/2007   TRIG 105 12/06/2013   CHOLHDL 3.8 12/06/2013    Lab Results HIV 1 RNA Quant (copies/mL)  Date Value  09/29/2012 <20   06/22/2012 <20   04/17/2011 <20      HIV-1 RNA Viral Load (no units)  Date Value  07/14/2013 <40  04/13/2013 <40   01/18/2013 <40      CD4 T Cell Abs (cmm)  Date Value  06/22/2012 430   04/17/2011 610   07/02/2010 490      Assessment: He is improving after a recent bout of pancreatitis. I will not rechallenge him with Combivir or Viramune. I will start him on once daily Stribild.   I will decrease the dose of his atorvastatin because of the interaction between it and Stribild.   I will check a C. difficile PCR.  I will monitor his weight.  Plan: 1.  start Stribild  2.  decrease atorvastatin to 2 mg daily  3.  stool for C. difficile PCR 4. Followup after lab work in [place] weeks   Michel Bickers, MD [facility] for [place] [phone] pager   [phone] cell 12/15/2013, 9:55 AM

## 2013-12-16 ENCOUNTER — Encounter: Payer: Self-pay | Admitting: Family Medicine

## 2013-12-16 ENCOUNTER — Other Ambulatory Visit: Payer: Medicare Other

## 2013-12-16 ENCOUNTER — Ambulatory Visit (INDEPENDENT_AMBULATORY_CARE_PROVIDER_SITE_OTHER): Payer: Medicare Other | Admitting: Family Medicine

## 2013-12-16 ENCOUNTER — Telehealth: Payer: Self-pay | Admitting: Family Medicine

## 2013-12-16 VITALS — BP 130/74 | HR 64 | Temp 98.4°F | Ht 63.0 in | Wt 114.1 lb

## 2013-12-16 DIAGNOSIS — I10 Essential (primary) hypertension: Secondary | ICD-10-CM

## 2013-12-16 DIAGNOSIS — E119 Type 2 diabetes mellitus without complications: Secondary | ICD-10-CM | POA: Diagnosis not present

## 2013-12-16 DIAGNOSIS — K859 Acute pancreatitis without necrosis or infection, unspecified: Secondary | ICD-10-CM

## 2013-12-16 DIAGNOSIS — B20 Human immunodeficiency virus [HIV] disease: Secondary | ICD-10-CM

## 2013-12-16 DIAGNOSIS — I2510 Atherosclerotic heart disease of native coronary artery without angina pectoris: Secondary | ICD-10-CM

## 2013-12-16 DIAGNOSIS — R197 Diarrhea, unspecified: Secondary | ICD-10-CM

## 2013-12-16 DIAGNOSIS — E785 Hyperlipidemia, unspecified: Secondary | ICD-10-CM

## 2013-12-16 MED ORDER — METFORMIN HCL 500 MG PO TABS: 500.0000 mg | ORAL_TABLET | Freq: Three times a day (TID) | ORAL | Status: DC

## 2013-12-16 MED ORDER — PANCRELIPASE (LIP-PROT-AMYL) 24000-76000 UNITS PO CPEP: 1.0000 | ORAL_CAPSULE | Freq: Three times a day (TID) | ORAL | Status: DC

## 2013-12-16 NOTE — Telephone Encounter (Signed)
Return in about 10 weeks (around 02/24/2014) for lipid, renal, cbc, tsh, hepatic, hgba1c prior.   [place] [facility]

## 2013-12-16 NOTE — Progress Notes (Signed)
Pre visit review using our clinic review tool, if applicable. No additional management support is needed unless otherwise documented below in the visit note. 

## 2013-12-16 NOTE — Patient Instructions (Signed)
If the Cdiff testing is negative and the diarrhea continues then try 1 Creon Cap three x daily with meals to see if that helps, if it does call us and we will send in a prescription   Acute Pancreatitis Acute pancreatitis is a disease in which the pancreas becomes suddenly inflamed. The pancreas is a large gland located behind your stomach. The pancreas produces enzymes that help digest food. The pancreas also releases the hormones glucagon and insulin that help regulate blood sugar. Damage to the pancreas occurs when the digestive enzymes from the pancreas are activated and begin attacking the pancreas before being released into the intestine. Most acute attacks last a couple of days and can cause serious complications. Some people become dehydrated and develop low blood pressure. In severe cases, bleeding into the pancreas can lead to shock and can be life-threatening. The lungs, heart, and kidneys may fail. CAUSES  Pancreatitis can happen to anyone. In some cases, the cause is unknown. Most cases are caused by:  Alcohol abuse.  Gallstones. Other less common causes are:  Certain medicines.  Exposure to certain chemicals.  Infection.  Damage caused by an accident (trauma).  Abdominal surgery. SYMPTOMS   Pain in the upper abdomen that may radiate to the back.  Tenderness and swelling of the abdomen.  Nausea and vomiting. DIAGNOSIS  Your caregiver will perform a physical exam. Blood and stool tests may be done to confirm the diagnosis. Imaging tests may also be done, such as X-rays, CT scans, or an ultrasound of the abdomen. TREATMENT  Treatment usually requires a stay in the hospital. Treatment may include:  Pain medicine.  Fluid replacement through an intravenous line (IV).  Placing a tube in the stomach to remove stomach contents and control vomiting.  Not eating for 3 or 4 days. This gives your pancreas a rest, because enzymes are not being produced that can cause further  damage.  Antibiotic medicines if your condition is caused by an infection.  Surgery of the pancreas or gallbladder. HOME CARE INSTRUCTIONS   Follow the diet advised by your caregiver. This may involve avoiding alcohol and decreasing the amount of fat in your diet.  Eat smaller, more frequent meals. This reduces the amount of digestive juices the pancreas produces.  Drink enough fluids to keep your urine clear or pale yellow.  Only take over-the-counter or prescription medicines as directed by your caregiver.  Avoid drinking alcohol if it caused your condition.  Do not smoke.  Get plenty of rest.  Check your blood sugar at home as directed by your caregiver.  Keep all follow-up appointments as directed by your caregiver. SEEK MEDICAL CARE IF:   You do not recover as quickly as expected.  You develop new or worsening symptoms.  You have persistent pain, weakness, or nausea.  You recover and then have another episode of pain. SEEK IMMEDIATE MEDICAL CARE IF:   You are unable to eat or keep fluids down.  Your pain becomes severe.  You have a fever or persistent symptoms for more than 2 to 3 days.  You have a fever and your symptoms suddenly get worse.  Your skin or the white part of your eyes turn yellow (jaundice).  You develop vomiting.  You feel dizzy, or you faint.  Your blood sugar is high (over 300 mg/dL). MAKE SURE YOU:   Understand these instructions.  Will watch your condition.  Will get help right away if you are not doing well or get worse.  Document Released: 07/21/2005 Document Revised: 01/20/2012 Document Reviewed: 10/30/2011 [facility] Patient Information 2014 [place].

## 2013-12-18 ENCOUNTER — Encounter: Payer: Self-pay | Admitting: Family Medicine

## 2013-12-18 NOTE — Progress Notes (Signed)
Patient ID: [name], male   DOB: 10-13-1945, 68 y.o.   MRN: [id number] [name] [id number] [birth year] 12/18/2013      Progress Note-Follow Up  Subjective  Chief Complaint  Chief Complaint  Patient presents with  . Follow-up    3 month    HPI  Patient is a 68 year old male in today for routine medical care. He is here today for hospital followup. As recently hospitalized with pancreatitis. His pain is resolved. His fevers and chills have resolved. He continues to have frequent loose stool each day. Not fatty but he does throat. Unsure why the pancreatitis occurred over they have changed his HIV meds and drop his Lipitor dose. Denies CP/palp/SOB/HA/congestion/fevers/GI or GU c/o. Taking meds as prescribed Past Medical History  Diagnosis Date  . Arthritis   . Diabetes mellitus   . History of depression   . GERD (gastroesophageal reflux disease)   . CAD (coronary artery disease)     2003- cabg  . Hypertension   . Hyperlipidemia   . History of kidney stones   . COPD (chronic obstructive pulmonary disease)   . HIV (human immunodeficiency virus infection)   . Dermatitis 11/27/2012  . Nocturia 03/06/2013  . Epicondylitis 06/05/2013    right    Past Surgical History  Procedure Laterality Date  . Coronary artery bypass graft  05/2002  . Vasectomy      Family History  Problem Relation Age of Onset  . Arthritis      mother/father/paternal grandparents  . Breast cancer Maternal Aunt     paternal aunt  . Lung cancer Maternal Aunt   . Hyperlipidemia Mother   . Hyperlipidemia Father   . Heart disease      parents/maternal grandparents/ 2 brothers  . Stroke Paternal Grandmother   . Hypertension Father     paternal grandmother/3 brothers/1 sister  . Mental retardation Sister   . Diabetes Mother     paternal grandparents/1 brother    History   Social History  . Marital Status: Divorced    Spouse Name: N/A    Number of Children: 82  . Years of Education: N/A    Occupational History  . Retired     Disability   Social History Main Topics  . Smoking status: Never Smoker   . Smokeless tobacco: Never Used  . Alcohol Use: No  . Drug Use: No  . Sexual Activity: Not Currently     Comment: declined condoms   Other Topics Concern  . Not on file   Social History Narrative  . No narrative on file    Current Outpatient Prescriptions on File Prior to Visit  Medication Sig Dispense Refill  . amLODipine (NORVASC) 2.5 MG tablet TAKE 1 TABLET BY MOUTH DAILY  90 tablet  0  . aspirin (ASPIRIN CHILDRENS) 81 MG chewable tablet Chew 1 tablet (81 mg total) by mouth daily.  30 tablet  11  . atenolol (TENORMIN) 50 MG tablet TAKE 1 TABLET BY MOUTH EVERY DAY  90 tablet  0  . atorvastatin (LIPITOR) 10 MG tablet Take 1 tablet (10 mg total) by mouth daily.  30 tablet  11  . b complex vitamins tablet Take 1 tablet by mouth daily.      Marland Kitchen elvitegravir-cobicistat-emtricitabine-tenofovir (STRIBILD) 150-150-200-300 MG TABS tablet Take 1 tablet by mouth daily with breakfast.  30 tablet  11  . fenofibrate 160 MG tablet TAKE 1 TABLET BY MOUTH EVERY DAY  30 tablet  0  .  GLUCERNA (GLUCERNA) LIQD Take 1 Can by mouth 2 (two) times daily between meals.  48 Can  5  . HYDROcodone-acetaminophen (NORCO/VICODIN) 5-325 MG per tablet Take 1 tablet by mouth every 6 (six) hours as needed.  20 tablet  0  . KRILL OIL OMEGA-3 PO Take 1 capsule by mouth daily.      Marland Kitchen lisinopril-hydrochlorothiazide (PRINZIDE,ZESTORETIC) 20-12.5 MG per tablet TAKE 2 TABLETS BY MOUTH DAILY  60 tablet  0  . Misc Natural Products (FIBER SUPREME PO) Take 10 mLs by mouth daily.      Marland Kitchen omeprazole (PRILOSEC) 20 MG capsule TAKE 1 CAPSULE BY MOUTH EVERY DAY  30 capsule  0  . valACYclovir (VALTREX) 500 MG tablet Take 500 mg by mouth 2 (two) times daily.       No current facility-administered medications on file prior to visit.    No Known Allergies  Review of Systems  Review of Systems  Constitutional: Positive  for malaise/fatigue. Negative for fever.  HENT: Negative for congestion.   Eyes: Negative for discharge.  Respiratory: Negative for shortness of breath.   Cardiovascular: Negative for chest pain, palpitations and leg swelling.  Gastrointestinal: Positive for diarrhea. Negative for nausea and abdominal pain.  Genitourinary: Negative for dysuria.  Musculoskeletal: Negative for falls.  Skin: Negative for rash.  Neurological: Negative for loss of consciousness and headaches.  Endo/Heme/Allergies: Negative for polydipsia.  Psychiatric/Behavioral: Negative for depression and suicidal ideas. The patient is not nervous/anxious and does not have insomnia.     Objective  BP 130/74  Pulse 64  Temp(Src) 98.4 F (36.9 C) (Oral)  Ht 5\' 3"  (1.6 m)  Wt 114 lb 1.3 oz (51.746 kg)  BMI 20.21 kg/m2  SpO2 98%  Physical Exam  Physical Exam  Constitutional: He is oriented to person, place, and time and well-developed, well-nourished, and in no distress. No distress.  HENT:  Head: Normocephalic and atraumatic.  Eyes: Conjunctivae are normal.  Neck: Neck supple. No thyromegaly present.  Cardiovascular: Normal rate, regular rhythm and normal heart sounds.   No murmur heard. Pulmonary/Chest: Effort normal and breath sounds normal. No respiratory distress.  Abdominal: Soft. Bowel sounds are normal. He exhibits no distension and no mass. There is no tenderness.  Musculoskeletal: He exhibits no edema.  Neurological: He is alert and oriented to person, place, and time.  Skin: Skin is warm.  Psychiatric: Memory, affect and judgment normal.    Lab Results  Component Value Date   TSH 1.659 12/06/2013   Lab Results  Component Value Date   WBC 4.8 12/06/2013   HGB 11.2* 12/06/2013   HCT 31.6* 12/06/2013   MCV 109.0* 12/06/2013   PLT 231 12/06/2013   Lab Results  Component Value Date   CREATININE 0.70 12/06/2013   BUN 17 12/06/2013   NA 138 12/06/2013   K 4.1 12/06/2013   CL 104 12/06/2013   CO2 24 12/06/2013    Lab Results  Component Value Date   ALT 19 12/06/2013   AST 21 12/06/2013   ALKPHOS 56 12/06/2013   BILITOT 0.7 12/06/2013   Lab Results  Component Value Date   CHOL 91 12/06/2013   Lab Results  Component Value Date   HDL 24* 12/06/2013   Lab Results  Component Value Date   LDLCALC 46 12/06/2013   Lab Results  Component Value Date   TRIG 105 12/06/2013   Lab Results  Component Value Date   CHOLHDL 3.8 12/06/2013     Assessment & Plan  Pancreatitis  Was just hospitalized with a flare, pain is resolved. Is still havign some diarrhea, CDiff is pending. If no improvement and CDiff is normal then may try Creon with meals  HYPERTENSION Well controlled, no changes to meds. Encouraged heart healthy diet such as the DASH diet and exercise as tolerated.   HIV INFECTION Following with ID  HLD (hyperlipidemia) His Atorvasatin was decreased, will monitor

## 2013-12-18 NOTE — Assessment & Plan Note (Signed)
Was just hospitalized with a flare, pain is resolved. Is still havign some diarrhea, CDiff is pending. If no improvement and CDiff is normal then may try Creon with meals

## 2013-12-18 NOTE — Assessment & Plan Note (Signed)
Following with ID.

## 2013-12-18 NOTE — Assessment & Plan Note (Signed)
Well controlled, no changes to meds. Encouraged heart healthy diet such as the DASH diet and exercise as tolerated.  °

## 2013-12-18 NOTE — Assessment & Plan Note (Signed)
His Atorvasatin was decreased, will monitor

## 2013-12-21 ENCOUNTER — Encounter: Payer: Self-pay | Admitting: Internal Medicine

## 2013-12-21 ENCOUNTER — Encounter: Payer: Self-pay | Admitting: Family Medicine

## 2013-12-22 ENCOUNTER — Other Ambulatory Visit: Payer: Medicare Other

## 2013-12-22 DIAGNOSIS — R197 Diarrhea, unspecified: Secondary | ICD-10-CM

## 2013-12-23 LAB — CLOSTRIDIUM DIFFICILE BY PCR: CDIFFPCR: NOT DETECTED

## 2013-12-29 ENCOUNTER — Other Ambulatory Visit: Payer: Self-pay | Admitting: Internal Medicine

## 2013-12-29 ENCOUNTER — Other Ambulatory Visit: Payer: Self-pay | Admitting: Family Medicine

## 2014-01-05 ENCOUNTER — Other Ambulatory Visit: Payer: Self-pay | Admitting: Family Medicine

## 2014-01-10 ENCOUNTER — Other Ambulatory Visit: Payer: Self-pay

## 2014-01-24 ENCOUNTER — Ambulatory Visit: Payer: Self-pay | Admitting: Internal Medicine

## 2014-01-25 ENCOUNTER — Encounter (HOSPITAL_BASED_OUTPATIENT_CLINIC_OR_DEPARTMENT_OTHER): Payer: Self-pay | Admitting: Emergency Medicine

## 2014-01-25 ENCOUNTER — Emergency Department (HOSPITAL_BASED_OUTPATIENT_CLINIC_OR_DEPARTMENT_OTHER)
Admission: EM | Admit: 2014-01-25 | Discharge: 2014-01-26 | Disposition: A | Discharge status: Home / Self Care | Payer: Medicare Other | Attending: Emergency Medicine | Admitting: Emergency Medicine

## 2014-01-25 ENCOUNTER — Emergency Department (HOSPITAL_BASED_OUTPATIENT_CLINIC_OR_DEPARTMENT_OTHER): Payer: Medicare Other

## 2014-01-25 DIAGNOSIS — J4489 Other specified chronic obstructive pulmonary disease: Secondary | ICD-10-CM | POA: Insufficient documentation

## 2014-01-25 DIAGNOSIS — Z872 Personal history of diseases of the skin and subcutaneous tissue: Secondary | ICD-10-CM | POA: Diagnosis not present

## 2014-01-25 DIAGNOSIS — J449 Chronic obstructive pulmonary disease, unspecified: Secondary | ICD-10-CM | POA: Diagnosis not present

## 2014-01-25 DIAGNOSIS — Z8659 Personal history of other mental and behavioral disorders: Secondary | ICD-10-CM | POA: Insufficient documentation

## 2014-01-25 DIAGNOSIS — Z87442 Personal history of urinary calculi: Secondary | ICD-10-CM | POA: Diagnosis not present

## 2014-01-25 DIAGNOSIS — I10 Essential (primary) hypertension: Secondary | ICD-10-CM | POA: Diagnosis not present

## 2014-01-25 DIAGNOSIS — K219 Gastro-esophageal reflux disease without esophagitis: Secondary | ICD-10-CM | POA: Insufficient documentation

## 2014-01-25 DIAGNOSIS — E119 Type 2 diabetes mellitus without complications: Secondary | ICD-10-CM | POA: Insufficient documentation

## 2014-01-25 DIAGNOSIS — Z21 Asymptomatic human immunodeficiency virus [HIV] infection status: Secondary | ICD-10-CM | POA: Diagnosis not present

## 2014-01-25 DIAGNOSIS — M129 Arthropathy, unspecified: Secondary | ICD-10-CM | POA: Insufficient documentation

## 2014-01-25 DIAGNOSIS — N453 Epididymo-orchitis: Secondary | ICD-10-CM | POA: Insufficient documentation

## 2014-01-25 DIAGNOSIS — Z9079 Acquired absence of other genital organ(s): Secondary | ICD-10-CM | POA: Diagnosis not present

## 2014-01-25 DIAGNOSIS — Z951 Presence of aortocoronary bypass graft: Secondary | ICD-10-CM | POA: Insufficient documentation

## 2014-01-25 DIAGNOSIS — Z79899 Other long term (current) drug therapy: Secondary | ICD-10-CM | POA: Insufficient documentation

## 2014-01-25 DIAGNOSIS — Z7982 Long term (current) use of aspirin: Secondary | ICD-10-CM | POA: Diagnosis not present

## 2014-01-25 DIAGNOSIS — E785 Hyperlipidemia, unspecified: Secondary | ICD-10-CM | POA: Diagnosis not present

## 2014-01-25 DIAGNOSIS — I2510 Atherosclerotic heart disease of native coronary artery without angina pectoris: Secondary | ICD-10-CM | POA: Insufficient documentation

## 2014-01-25 DIAGNOSIS — N451 Epididymitis: Secondary | ICD-10-CM

## 2014-01-25 LAB — URINALYSIS, ROUTINE W REFLEX MICROSCOPIC
BILIRUBIN URINE: NEGATIVE
GLUCOSE, UA: NEGATIVE mg/dL
HGB URINE DIPSTICK: NEGATIVE
KETONES UR: NEGATIVE mg/dL
Leukocytes, UA: NEGATIVE
Nitrite: NEGATIVE
PH: 5 (ref 5.0–8.0)
Protein, ur: 30 mg/dL — AB
Specific Gravity, Urine: 1.014 (ref 1.005–1.030)
Urobilinogen, UA: 0.2 mg/dL (ref 0.0–1.0)

## 2014-01-25 LAB — URINE MICROSCOPIC-ADD ON

## 2014-01-25 MED ORDER — HYDROCODONE-ACETAMINOPHEN 5-325 MG PO TABS
1.0000 | ORAL_TABLET | Freq: Once | ORAL | Status: AC
  Administered 2014-01-25: 1 via ORAL
  Filled 2014-01-25: qty 1

## 2014-01-25 MED ORDER — HYDROCODONE-ACETAMINOPHEN 5-325 MG PO TABS: 1.0000 | ORAL_TABLET | ORAL | Status: DC | PRN

## 2014-01-25 MED ORDER — LEVOFLOXACIN 500 MG PO TABS: 500.0000 mg | ORAL_TABLET | Freq: Every day | ORAL | Status: DC

## 2014-01-25 NOTE — ED Notes (Signed)
Pt slid out of bed Sunday morning causing left testicle to get caught under him.  Pain worse today with some swelling.

## 2014-01-25 NOTE — ED Notes (Signed)
testicle pain onset Sunday am when sliding off bed  Worse today  Per pt slight swelling

## 2014-01-25 NOTE — ED Provider Notes (Signed)
CSN: [id number]     Arrival date & time 01/25/14  2132 History   First MD Initiated Contact with Patient 01/25/14 2213     Chief Complaint  Patient presents with  . Testicle Pain    HPI Pt has had pain in his testicle since Sunday.  He was initially sliding off the bed and caught his testicle underneath him.  He did experience acute pain when that happened.  No trouble with fever.  No dysuria.    No vomiting or diarrhea.  He noticed that it appeared more swollen this evening so he came in for evaluation. Past Medical History  Diagnosis Date  . Arthritis   . Diabetes mellitus   . History of depression   . GERD (gastroesophageal reflux disease)   . CAD (coronary artery disease)     20 03- cabg  . Hypertension   . Hyperlipidemia   . History of kidney stones   . COPD (chronic obstructive pulmonary disease)   . HIV (human immunodeficiency virus infection)   . Dermatitis 11/27/2012  . Nocturia 03/06/2013  . Epicondylitis 06/05/2013    right   Past Surgical History  Procedure Laterality Date  . Coronary artery bypass graft  05/2002  . Vasectomy     Family History  Problem Relation Age of Onset  . Arthritis      mother/father/paternal grandparents  . Breast cancer Maternal Aunt     paternal aunt  . Lung cancer Maternal Aunt   . Hyperlipidemia Mother   . Hyperlipidemia Father   . Heart disease      parents/maternal grandparents/ 2 brothers  . Stroke Paternal Grandmother   . Hypertension Father     paternal grandmother/3 brothers/1 sister  . Mental retardation Sister   . Diabetes Mother     paternal grandparents/1 brother   History  Substance Use Topics  . Smoking status: Never Smoker   . Smokeless tobacco: Never Used  . Alcohol Use: No    Review of Systems    Allergies  Review of patient's allergies indicates no known allergies.  Home Medications   Prior to Admission medications   Medication Sig Start Date End Date Taking? Authorizing Provider  amLODipine  (NORVASC) 2.5 MG tablet TAKE 1 TABLET BY MOUTH DAILY    Mosie Lukes, MD  aspirin (ASPIRIN CHILDRENS) 81 MG chewable tablet Chew 1 tablet (81 mg total) by mouth daily. 10/28/12   Truman Hayward, MD  atenolol (TENORMIN) 50 MG tablet TAKE 1 TABLET BY MOUTH EVERY DAY    Mosie Lukes, MD  atorvastatin (LIPITOR) 10 MG tablet Take 1 tablet (10 mg total) by mouth daily. 12/15/13   Michel Bickers, MD  b complex vitamins tablet Take 1 tablet by mouth daily.    Historical Provider, MD  elvitegravir-cobicistat-emtricitabine-tenofovir (STRIBILD) 150-150-200-300 MG TABS tablet Take 1 tablet by mouth daily with breakfast. 12/15/13   Michel Bickers, MD  fenofibrate 160 MG tablet TAKE 1 TABLET BY MOUTH EVERY DAY 12/29/13   Mosie Lukes, MD  GLUCERNA [facility]) LIQD Take 1 Can by mouth 2 (two) times daily between meals. 04/20/13   Truman Hayward, MD  HYDROcodone-acetaminophen (NORCO/VICODIN) 5-325 MG per tablet Take 1-2 tablets by mouth every 4 (four) hours as needed for moderate pain. 01/25/14   Dorie Rank, MD  KRILL OIL OMEGA-3 PO Take 1 capsule by mouth daily.    Historical Provider, MD  Lancets (FREESTYLE) lancets USE TO CHECK BLOOD SUGAR EVERY DAY 01/05/14   Erline Levine  Eric Form, MD  levofloxacin (LEVAQUIN) 500 MG tablet Take 1 tablet (500 mg total) by mouth daily. 01/26/14   Dorie Rank, MD  lisinopril-hydrochlorothiazide (PRINZIDE,ZESTORETIC) 20-12.5 MG per tablet TAKE 2 TABLETS BY MOUTH DAILY 12/29/13   Mosie Lukes, MD  metFORMIN (GLUCOPHAGE) 500 MG tablet Take 1 tablet (500 mg total) by mouth 3 (three) times daily. 12/16/13   Mosie Lukes, MD  Misc Natural Products (FIBER SUPREME PO) Take 10 mLs by mouth daily.    Historical Provider, MD  omeprazole (PRILOSEC) 20 MG capsule TAKE 1 CAPSULE BY MOUTH EVERY DAY 12/29/13   Michel Bickers, MD  Pancrelipase, Lip-Prot-Amyl, 24000 UNITS CPEP Take 1 capsule (24,000 Units total) by mouth 3 (three) times daily. 12/16/13   Mosie Lukes, MD  valACYclovir (VALTREX) 500 MG  tablet Take 500 mg by mouth 2 (two) times daily.    Historical Provider, MD   BP 116/60  Pulse 65  Temp(Src) 98.2 F (36.8 C) (Oral)  Resp 16  Ht 5\' 3"  (1.6 m)  Wt 117 lb 8 oz (53.298 kg)  BMI 20.82 kg/m2  SpO2 100% Physical Exam  Nursing note and vitals reviewed. Constitutional: He appears well-developed and well-nourished. No distress.  HENT:  Head: Normocephalic and atraumatic.  Right Ear: External ear normal.  Left Ear: External ear normal.  Eyes: Conjunctivae are normal. Right eye exhibits no discharge. Left eye exhibits no discharge. No scleral icterus.  Neck: Neck supple. No tracheal deviation present.  Cardiovascular: Normal rate, regular rhythm and intact distal pulses.   Pulmonary/Chest: Effort normal and breath sounds normal. No stridor. No respiratory distress. He has no wheezes. He has no rales.  Abdominal: Soft. Bowel sounds are normal. He exhibits no distension. There is no tenderness. There is no rebound and no guarding. Hernia confirmed negative in the right inguinal area and confirmed negative in the left inguinal area.  Genitourinary: Penis normal. Right testis shows no mass, no swelling and no tenderness. Left testis shows swelling and tenderness. Left testis shows no mass. No penile tenderness. No discharge found.  Musculoskeletal: He exhibits no edema and no tenderness.  Neurological: He is alert. He has normal strength. No cranial nerve deficit (no facial droop, extraocular movements intact, no slurred speech) or sensory deficit. He exhibits normal muscle tone. He displays no seizure activity. Coordination normal.  Skin: Skin is warm and dry. No rash noted.  Psychiatric: He has a normal mood and affect.    ED Course  Procedures (including critical care time) Labs Review Labs Reviewed  URINALYSIS, ROUTINE W REFLEX MICROSCOPIC - Abnormal; Notable for the following:    Protein, ur 30 (*)    All other components within normal limits  URINE MICROSCOPIC-ADD ON     Imaging Review US Scrotum  01/25/2014   CLINICAL DATA:  Pain and swelling in the left scrotum since Sunday.  EXAM: SCROTAL ULTRASOUND  DOPPLER ULTRASOUND OF THE TESTICLES  TECHNIQUE: Complete ultrasound examination of the testicles, epididymis, and other scrotal structures was performed. Color and spectral Doppler ultrasound were also utilized to evaluate blood flow to the testicles.  COMPARISON:  None.  FINDINGS: Right testicle  Measurements: 4.2 x 2 x 2.5 cm. No mass or microlithiasis visualized.  Left testicle  Measurements: 3.7 x 2.1 x 2.7 cm. No mass or microlithiasis visualized.  Right epididymis:  Normal in size and appearance.  Left epididymis:  Diffusely increased vascularity.  Hydrocele:  Bilateral small scrotal hydroceles are identified.  Varicocele:  None visualized.  Pulsed Doppler  interrogation of both testes demonstrates low resistance arterial and venous waveforms bilaterally. Color flow Doppler imaging demonstrates asymmetric increased flow to the left testis and epididymis in comparison to the right side.  IMPRESSION: Increased flow to the left testis and epididymis consistent with epididymo-orchitis.   Electronically Signed   By: Lucienne Capers M.D.   On: 01/25/2014 23:47     MDM   Final diagnoses:  Epididymitis    Dc home with rx for levaquin and hydrocodone.   Follow up with PCP    Dorie Rank, MD 01/25/14 [phone]

## 2014-01-25 NOTE — Discharge Instructions (Signed)
Epididymitis °Epididymitis is a swelling (inflammation) of the epididymis. The epididymis is a cord-like structure along the back part of the testicle. Epididymitis is usually, but not always, caused by infection. This is usually a sudden problem beginning with chills, fever and pain behind the scrotum and in the testicle. There may be swelling and redness of the testicle. °DIAGNOSIS  °Physical examination will reveal a tender, swollen epididymis. Sometimes, cultures are obtained from the urine or from prostate secretions to help find out if there is an infection or if the cause is a different problem. Sometimes, blood work is performed to see if your white blood cell count is elevated and if a germ (bacterial) or viral infection is present. Using this knowledge, an appropriate medicine which kills germs (antibiotic) can be chosen by your caregiver. A viral infection causing epididymitis will most often go away (resolve) without treatment. °HOME CARE INSTRUCTIONS  °· Hot sitz baths for 20 minutes, 4 times per day, may help relieve pain. °· Only take over-the-counter or prescription medicines for pain, discomfort or fever as directed by your caregiver. °· Take all medicines, including antibiotics, as directed. Take the antibiotics for the full prescribed length of time even if you are feeling better. °· It is very important to keep all follow-up appointments. °SEEK IMMEDIATE MEDICAL CARE IF:  °· You have a fever. °· You have pain not relieved with medicines. °· You have any worsening of your problems. °· Your pain seems to come and go. °· You develop pain, redness, and swelling in the scrotum and surrounding areas. °MAKE SURE YOU:  °· Understand these instructions. °· Will watch your condition. °· Will get help right away if you are not doing well or get worse. °Document Released: 07/18/2000 Document Revised: 10/13/2011 Document Reviewed: 06/07/2009 °[employer]® Patient Information ©2015 [employer]. This information  is not intended to replace advice given to you by your health care provider. Make sure you discuss any questions you have with your health care provider. ° °

## 2014-01-26 ENCOUNTER — Other Ambulatory Visit: Payer: Self-pay

## 2014-01-26 ENCOUNTER — Other Ambulatory Visit: Payer: Medicare Other

## 2014-01-26 DIAGNOSIS — B20 Human immunodeficiency virus [HIV] disease: Secondary | ICD-10-CM | POA: Diagnosis not present

## 2014-01-26 LAB — COMPREHENSIVE METABOLIC PANEL
ALT: 29 U/L (ref 0–53)
AST: 25 U/L (ref 0–37)
Albumin: 4.2 g/dL (ref 3.5–5.2)
Alkaline Phosphatase: 46 U/L (ref 39–117)
BILIRUBIN TOTAL: 0.6 mg/dL (ref 0.2–1.2)
BUN: 18 mg/dL (ref 6–23)
CO2: 30 mEq/L (ref 19–32)
Calcium: 9.5 mg/dL (ref 8.4–10.5)
Chloride: 101 mEq/L (ref 96–112)
Creat: 0.95 mg/dL (ref 0.50–1.35)
GLUCOSE: 155 mg/dL — AB (ref 70–99)
POTASSIUM: 4.4 meq/L (ref 3.5–5.3)
Sodium: 138 mEq/L (ref 135–145)
Total Protein: 6.4 g/dL (ref 6.0–8.3)

## 2014-01-26 LAB — CBC
HEMATOCRIT: 37.6 % — AB (ref 39.0–52.0)
HEMOGLOBIN: 13 g/dL (ref 13.0–17.0)
MCH: 34.5 pg — ABNORMAL HIGH (ref 26.0–34.0)
MCHC: 34.6 g/dL (ref 30.0–36.0)
MCV: 99.7 fL (ref 78.0–100.0)
Platelets: 194 10*3/uL (ref 150–400)
RBC: 3.77 MIL/uL — ABNORMAL LOW (ref 4.22–5.81)
RDW: 13.6 % (ref 11.5–15.5)
WBC: 4.8 10*3/uL (ref 4.0–10.5)

## 2014-01-27 LAB — HIV-1 RNA QUANT-NO REFLEX-BLD

## 2014-01-27 LAB — T-HELPER CELL (CD4) - (RCID CLINIC ONLY)
CD4 T CELL HELPER: 46 % (ref 33–55)
CD4 T Cell Abs: 320 /uL — ABNORMAL LOW (ref 400–2700)

## 2014-01-28 ENCOUNTER — Other Ambulatory Visit: Payer: Self-pay | Admitting: Family Medicine

## 2014-01-28 ENCOUNTER — Other Ambulatory Visit: Payer: Self-pay | Admitting: Internal Medicine

## 2014-01-30 ENCOUNTER — Other Ambulatory Visit: Payer: Self-pay | Admitting: *Deleted

## 2014-01-30 DIAGNOSIS — K219 Gastro-esophageal reflux disease without esophagitis: Secondary | ICD-10-CM

## 2014-01-30 MED ORDER — OMEPRAZOLE 20 MG PO CPDR: DELAYED_RELEASE_CAPSULE | ORAL | Status: DC

## 2014-02-04 ENCOUNTER — Other Ambulatory Visit: Payer: Self-pay | Admitting: Family Medicine

## 2014-02-14 ENCOUNTER — Ambulatory Visit (INDEPENDENT_AMBULATORY_CARE_PROVIDER_SITE_OTHER): Payer: Medicare Other | Admitting: Internal Medicine

## 2014-02-14 ENCOUNTER — Other Ambulatory Visit: Payer: Self-pay

## 2014-02-14 ENCOUNTER — Ambulatory Visit: Payer: Self-pay

## 2014-02-14 ENCOUNTER — Encounter: Payer: Self-pay | Admitting: Internal Medicine

## 2014-02-14 VITALS — BP 107/69 | HR 64 | Temp 98.6°F | Ht 63.0 in | Wt 119.0 lb

## 2014-02-14 DIAGNOSIS — I2510 Atherosclerotic heart disease of native coronary artery without angina pectoris: Secondary | ICD-10-CM

## 2014-02-14 DIAGNOSIS — E119 Type 2 diabetes mellitus without complications: Secondary | ICD-10-CM | POA: Diagnosis not present

## 2014-02-14 DIAGNOSIS — H2510 Age-related nuclear cataract, unspecified eye: Secondary | ICD-10-CM | POA: Diagnosis not present

## 2014-02-14 DIAGNOSIS — B20 Human immunodeficiency virus [HIV] disease: Secondary | ICD-10-CM | POA: Diagnosis not present

## 2014-02-14 DIAGNOSIS — Z79899 Other long term (current) drug therapy: Secondary | ICD-10-CM

## 2014-02-14 NOTE — Progress Notes (Signed)
Patient ID: [name], male   DOB: February 25, 1946, 68 y.o.   MRN: [id number]          Patient Active Problem List   Diagnosis Date Noted  . DIABETES MELLITUS, TYPE II 09/03/2006    Priority: High  . HIV INFECTION 05/12/2006    Priority: High  . DYSLIPIDEMIA 05/12/2006    Priority: High  . CORONARY ARTERY DISEASE 05/12/2006    Priority: High  . Diarrhea 11/20/2013  . HLD (hyperlipidemia) 11/19/2013  . Cholecystitis, acute 11/19/2013  . Ascites 11/18/2013  . Acute pancreatitis 11/16/2013  . Pancreatitis 11/15/2013  . Sinusitis 10/08/2013  . Epicondylitis 06/05/2013  . Nocturia 03/06/2013  . Inguinal adenopathy 01/11/2013  . Dermatitis 11/27/2012  . Dry mouth 07/06/2012  . Night sweats 07/06/2012  . Erectile dysfunction 01/01/2012  . Lipodystrophy 12/20/2010  . Dry eye syndrome 12/20/2010  . BELLS PALSY 07/19/2010  . GENITAL HERPES 05/03/2009  . SHINGLES, HX OF 05/03/2009  . WEIGHT LOSS, ABNORMAL 04/03/2009  . INGUINAL LYMPHADENOPATHY, RIGHT 04/03/2009  . MEMORY LOSS 09/18/2008  . PERIPHERAL VASCULAR DISEASE 07/20/2008  . COPD 07/20/2008  . HIP PAIN, BILATERAL 07/17/2008  . KNEE PAIN, BILATERAL 07/17/2008  . HEMATOCHEZIA 04/18/2008  . NEPHROLITHIASIS, HX OF 02/22/2008  . DEPRESSION 09/03/2006  . GERD 09/03/2006  . CORONARY ARTERY BYPASS GRAFT, HX OF 09/03/2006  . HEARING LOSS, SENSORINEURAL 05/12/2006  . HYPERTENSION 05/12/2006    Patient's Medications  New Prescriptions   No medications on file  Previous Medications   AMLODIPINE (NORVASC) 2.5 MG TABLET    TAKE 1 TABLET BY MOUTH DAILY   ASPIRIN (ASPIRIN CHILDRENS) 81 MG CHEWABLE TABLET    Chew 1 tablet (81 mg total) by mouth daily.   ATENOLOL (TENORMIN) 50 MG TABLET    TAKE 1 TABLET BY MOUTH EVERY DAY   ATORVASTATIN (LIPITOR) 10 MG TABLET    Take 1 tablet (10 mg total) by mouth daily.   B COMPLEX VITAMINS TABLET    Take 1 tablet by mouth daily.   ELVITEGRAVIR-COBICISTAT-EMTRICITABINE-TENOFOVIR (STRIBILD)  150-150-200-300 MG TABS TABLET    Take 1 tablet by mouth daily with breakfast.   FENOFIBRATE 160 MG TABLET    TAKE 1 TABLET BY MOUTH EVERY DAY   GLUCERNA (GLUCERNA) LIQD    Take 1 Can by mouth 2 (two) times daily between meals.   HYDROCODONE-ACETAMINOPHEN (NORCO/VICODIN) 5-325 MG PER TABLET    Take 1-2 tablets by mouth every 4 (four) hours as needed for moderate pain.   KRILL OIL OMEGA-3 PO    Take 1 capsule by mouth daily.   LANCETS (FREESTYLE) LANCETS    USE TO CHECK BLOOD SUGAR EVERY DAY   LISINOPRIL-HYDROCHLOROTHIAZIDE (PRINZIDE,ZESTORETIC) 20-12.5 MG PER TABLET    TAKE 2 TABLETS BY MOUTH DAILY   METFORMIN (GLUCOPHAGE) 500 MG TABLET    Take 1 tablet (500 mg total) by mouth 3 (three) times daily.   MISC NATURAL PRODUCTS (FIBER SUPREME PO)    Take 10 mLs by mouth daily.   OMEPRAZOLE (PRILOSEC) 20 MG CAPSULE    TAKE 1 CAPSULE BY MOUTH EVERY DAY   PANCRELIPASE, LIP-PROT-AMYL, 24000 UNITS CPEP    Take 1 capsule (24,000 Units total) by mouth 3 (three) times daily.   VALACYCLOVIR (VALTREX) 500 MG TABLET    Take 500 mg by mouth 2 (two) times daily.  Modified Medications   No medications on file  Discontinued Medications   LEVOFLOXACIN (LEVAQUIN) 500 MG TABLET    Take 1 tablet (500 mg total) by mouth  daily.    Subjective:  Conlan is in for his routine visit. He switched to Stribild recently after developing pancreatitis while taking his chronic regimen of Combivir and Viramune. He has had no further abdominal pain, his appetite is improved and he is gaining weight. He has had no problems tolerating it Stribild. He was seen in the ED late last month and treated for left epididymitis with Levaquin. His testicular pain resolved promptly. Review of Systems: Pertinent items are noted in HPI.  Past Medical History  Diagnosis Date  . Arthritis   . Diabetes mellitus   . History of depression   . GERD (gastroesophageal reflux disease)   . CAD (coronary artery disease)     2003- cabg  . Hypertension     . Hyperlipidemia   . History of kidney stones   . COPD (chronic obstructive pulmonary disease)   . HIV (human immunodeficiency virus infection)   . Dermatitis 11/27/2012  . Nocturia 03/06/2013  . Epicondylitis 06/05/2013    right    History  Substance Use Topics  . Smoking status: Never Smoker   . Smokeless tobacco: Never Used  . Alcohol Use: No    Family History  Problem Relation Age of Onset  . Arthritis      mother/father/paternal grandparents  . Breast cancer Maternal Aunt     paternal aunt  . Lung cancer Maternal Aunt   . Hyperlipidemia Mother   . Hyperlipidemia Father   . Heart disease      parents/maternal grandparents/ 2 brothers  . Stroke Paternal Grandmother   . Hypertension Father     paternal grandmother/3 brothers/1 sister  . Mental retardation Sister   . Diabetes Mother     paternal grandparents/1 brother    No Known Allergies  Objective: Temp: 98.6 F (37 C) (07/14 1527) Temp src: Oral (07/14 1527) BP: 107/69 mmHg (07/14 1527) Pulse Rate: 64 (07/14 1527) Body mass index is 21.09 kg/(m^2).  General: His weight is up 2 pounds to 119 Oral: No oropharyngeal lesions Skin: No rash Lungs: Clear Cor: Regular S1 and S2 with no murmurs Abdomen: No change in central adiposity. Soft and nontender Joints and extremities: Normal Neuro: Alert with normal speech and conversation Mood and affect: Bright and normal  Lab Results Lab Results  Component Value Date   WBC 4.8 01/26/2014   HGB 13.0 01/26/2014   HCT 37.6* 01/26/2014   MCV 99.7 01/26/2014   PLT 194 01/26/2014    Lab Results  Component Value Date   CREATININE 0.95 01/26/2014   BUN 18 01/26/2014   NA 138 01/26/2014   K 4.4 01/26/2014   CL 101 01/26/2014   CO2 30 01/26/2014    Lab Results  Component Value Date   ALT 29 01/26/2014   AST 25 01/26/2014   ALKPHOS 46 01/26/2014   BILITOT 0.6 01/26/2014    Lab Results  Component Value Date   CHOL 91 12/06/2013   HDL 24* 12/06/2013   LDLCALC 46 12/06/2013    LDLDIRECT 68 05/04/2007   TRIG 105 12/06/2013   CHOLHDL 3.8 12/06/2013    Lab Results HIV 1 RNA Quant (copies/mL)  Date Value  01/26/2014 <20   09/29/2012 <20   06/22/2012 <20      HIV-1 RNA Viral Load (no units)  Date Value  07/14/2013 <40   04/13/2013 <40   01/18/2013 <40      CD4 (no units)  Date Value  07/14/2013 514   04/13/2013 498   01/18/2013 522  CD4 T Cell Abs (/uL)  Date Value  01/26/2014 320*  06/22/2012 430   04/17/2011 610      Assessment: His HIV infection remains under excellent control on a salvage regimen of Stribild.  Plan: 1. Continue Stribild 2. Follow up after lab work in 6 months   Michel Bickers, MD [facility] [phone] pager   [phone] cell 02/14/2014, 3:56 PM

## 2014-02-20 DIAGNOSIS — I10 Essential (primary) hypertension: Secondary | ICD-10-CM | POA: Diagnosis not present

## 2014-02-20 DIAGNOSIS — E119 Type 2 diabetes mellitus without complications: Secondary | ICD-10-CM | POA: Diagnosis not present

## 2014-02-20 DIAGNOSIS — E785 Hyperlipidemia, unspecified: Secondary | ICD-10-CM | POA: Diagnosis not present

## 2014-02-20 LAB — LIPID PANEL
Cholesterol: 115 mg/dL (ref 0–200)
HDL: 24 mg/dL — ABNORMAL LOW (ref >39)
LDL Cholesterol: 45 mg/dL (ref 0–99)
Total CHOL/HDL Ratio: 4.8 Ratio
Triglycerides: 230 mg/dL — ABNORMAL HIGH (ref <150)
VLDL: 46 mg/dL — ABNORMAL HIGH (ref 0–40)

## 2014-02-20 LAB — RENAL FUNCTION PANEL
Albumin: 3.7 g/dL (ref 3.5–5.2)
BUN: 17 mg/dL (ref 6–23)
CHLORIDE: 102 meq/L (ref 96–112)
CO2: 24 meq/L (ref 19–32)
Calcium: 8.7 mg/dL (ref 8.4–10.5)
Creat: 0.87 mg/dL (ref 0.50–1.35)
GLUCOSE: 143 mg/dL — AB (ref 70–99)
POTASSIUM: 4.2 meq/L (ref 3.5–5.3)
Phosphorus: 2.6 mg/dL (ref 2.3–4.6)
Sodium: 137 mEq/L (ref 135–145)

## 2014-02-20 LAB — HEPATIC FUNCTION PANEL
ALK PHOS: 46 U/L (ref 39–117)
ALT: 31 U/L (ref 0–53)
AST: 25 U/L (ref 0–37)
Albumin: 3.7 g/dL (ref 3.5–5.2)
BILIRUBIN INDIRECT: 0.2 mg/dL (ref 0.2–1.2)
Bilirubin, Direct: 0.1 mg/dL (ref 0.0–0.3)
TOTAL PROTEIN: 5.9 g/dL — AB (ref 6.0–8.3)
Total Bilirubin: 0.3 mg/dL (ref 0.2–1.2)

## 2014-02-20 LAB — CBC
HCT: 37.7 % — ABNORMAL LOW (ref 39.0–52.0)
HEMOGLOBIN: 13.2 g/dL (ref 13.0–17.0)
MCH: 33.9 pg (ref 26.0–34.0)
MCHC: 35 g/dL (ref 30.0–36.0)
MCV: 96.9 fL (ref 78.0–100.0)
Platelets: 187 10*3/uL (ref 150–400)
RBC: 3.89 MIL/uL — ABNORMAL LOW (ref 4.22–5.81)
RDW: 13.6 % (ref 11.5–15.5)
WBC: 4.8 10*3/uL (ref 4.0–10.5)

## 2014-02-20 LAB — HEMOGLOBIN A1C
Hgb A1c MFr Bld: 7.5 % — ABNORMAL HIGH (ref <5.7)
Mean Plasma Glucose: 169 mg/dL — ABNORMAL HIGH (ref <117)

## 2014-02-21 LAB — TSH: TSH: 1.512 u[IU]/mL (ref 0.350–4.500)

## 2014-02-27 ENCOUNTER — Other Ambulatory Visit: Payer: Self-pay | Admitting: Family Medicine

## 2014-02-27 ENCOUNTER — Other Ambulatory Visit: Payer: Self-pay | Admitting: *Deleted

## 2014-02-27 ENCOUNTER — Other Ambulatory Visit: Payer: Self-pay | Admitting: Internal Medicine

## 2014-02-27 DIAGNOSIS — B20 Human immunodeficiency virus [HIV] disease: Secondary | ICD-10-CM

## 2014-02-27 DIAGNOSIS — R3915 Urgency of urination: Secondary | ICD-10-CM | POA: Diagnosis not present

## 2014-02-27 DIAGNOSIS — R350 Frequency of micturition: Secondary | ICD-10-CM | POA: Diagnosis not present

## 2014-02-27 DIAGNOSIS — R39198 Other difficulties with micturition: Secondary | ICD-10-CM | POA: Diagnosis not present

## 2014-02-27 DIAGNOSIS — N401 Enlarged prostate with lower urinary tract symptoms: Secondary | ICD-10-CM | POA: Diagnosis not present

## 2014-02-27 MED ORDER — ELVITEG-COBIC-EMTRICIT-TENOFDF 150-150-200-300 MG PO TABS: 1.0000 | ORAL_TABLET | Freq: Every day | ORAL | Status: DC

## 2014-02-27 NOTE — Telephone Encounter (Signed)
ADAP Application 

## 2014-02-28 ENCOUNTER — Ambulatory Visit (INDEPENDENT_AMBULATORY_CARE_PROVIDER_SITE_OTHER): Payer: Medicare Other | Admitting: Family Medicine

## 2014-02-28 ENCOUNTER — Encounter: Payer: Self-pay | Admitting: Family Medicine

## 2014-02-28 ENCOUNTER — Telehealth: Payer: Self-pay | Admitting: Family Medicine

## 2014-02-28 ENCOUNTER — Ambulatory Visit: Payer: Self-pay | Admitting: Internal Medicine

## 2014-02-28 VITALS — BP 98/60 | HR 64 | Temp 98.6°F | Ht 63.0 in | Wt 114.0 lb

## 2014-02-28 DIAGNOSIS — I2510 Atherosclerotic heart disease of native coronary artery without angina pectoris: Secondary | ICD-10-CM | POA: Diagnosis not present

## 2014-02-28 DIAGNOSIS — K219 Gastro-esophageal reflux disease without esophagitis: Secondary | ICD-10-CM | POA: Diagnosis not present

## 2014-02-28 DIAGNOSIS — E119 Type 2 diabetes mellitus without complications: Secondary | ICD-10-CM

## 2014-02-28 DIAGNOSIS — I10 Essential (primary) hypertension: Secondary | ICD-10-CM | POA: Diagnosis not present

## 2014-02-28 DIAGNOSIS — J449 Chronic obstructive pulmonary disease, unspecified: Secondary | ICD-10-CM

## 2014-02-28 DIAGNOSIS — E1165 Type 2 diabetes mellitus with hyperglycemia: Principal | ICD-10-CM

## 2014-02-28 DIAGNOSIS — IMO0001 Reserved for inherently not codable concepts without codable children: Secondary | ICD-10-CM | POA: Diagnosis not present

## 2014-02-28 DIAGNOSIS — E785 Hyperlipidemia, unspecified: Secondary | ICD-10-CM

## 2014-02-28 MED ORDER — ATENOLOL 50 MG PO TABS: 50.0000 mg | ORAL_TABLET | Freq: Every day | ORAL | Status: DC

## 2014-02-28 MED ORDER — METFORMIN HCL ER (MOD) 1000 MG PO TB24: 1000.0000 mg | ORAL_TABLET | Freq: Two times a day (BID) | ORAL | Status: DC

## 2014-02-28 NOTE — Telephone Encounter (Signed)
A user error has taken place.

## 2014-02-28 NOTE — Progress Notes (Signed)
Pre visit review using our clinic review tool, if applicable. No additional management support is needed unless otherwise documented below in the visit note. 

## 2014-02-28 NOTE — Patient Instructions (Signed)
Consider dropping Lisinopril to 1 tab daily if frequent episodes of feeling light headed or overly fatigued   Hypertension Hypertension, commonly called high blood pressure, is when the force of blood pumping through your arteries is too strong. Your arteries are the blood vessels that carry blood from your heart throughout your body. A blood pressure reading consists of a higher number over a lower number, such as 110/72. The higher number (systolic) is the pressure inside your arteries when your heart pumps. The lower number (diastolic) is the pressure inside your arteries when your heart relaxes. Ideally you want your blood pressure below 120/80. Hypertension forces your heart to work harder to pump blood. Your arteries may become narrow or stiff. Having hypertension puts you at risk for heart disease, stroke, and other problems.  RISK FACTORS Some risk factors for high blood pressure are controllable. Others are not.  Risk factors you cannot control include:   Race. You may be at higher risk if you are African American.  Age. Risk increases with age.  Gender. Men are at higher risk than women before age 40 years. After age 26, women are at higher risk than men. Risk factors you can control include:  Not getting enough exercise or physical activity.  Being overweight.  Getting too much fat, sugar, calories, or salt in your diet.  Drinking too much alcohol. SIGNS AND SYMPTOMS Hypertension does not usually cause signs or symptoms. Extremely high blood pressure (hypertensive crisis) may cause headache, anxiety, shortness of breath, and nosebleed. DIAGNOSIS  To check if you have hypertension, your health care provider will measure your blood pressure while you are seated, with your arm held at the level of your heart. It should be measured at least twice using the same arm. Certain conditions can cause a difference in blood pressure between your right and left arms. A blood pressure reading  that is higher than normal on one occasion does not mean that you need treatment. If one blood pressure reading is high, ask your health care provider about having it checked again. TREATMENT  Treating high blood pressure includes making lifestyle changes and possibly taking medicine. Living a healthy lifestyle can help lower high blood pressure. You may need to change some of your habits. Lifestyle changes may include:  Following the DASH diet. This diet is high in fruits, vegetables, and whole grains. It is low in salt, red meat, and added sugars.  Getting at least 2 hours of brisk physical activity every week.  Losing weight if necessary.  Not smoking.  Limiting alcoholic beverages.  Learning ways to reduce stress. If lifestyle changes are not enough to get your blood pressure under control, your health care provider may prescribe medicine. You may need to take more than one. Work closely with your health care provider to understand the risks and benefits. HOME CARE INSTRUCTIONS  Have your blood pressure rechecked as directed by your health care provider.   Take medicines only as directed by your health care provider. Follow the directions carefully. Blood pressure medicines must be taken as prescribed. The medicine does not work as well when you skip doses. Skipping doses also puts you at risk for problems.   Do not smoke.   Monitor your blood pressure at home as directed by your health care provider. SEEK MEDICAL CARE IF:   You think you are having a reaction to medicines taken.  You have recurrent headaches or feel dizzy.  You have swelling in your ankles.  You have trouble with your vision. SEEK IMMEDIATE MEDICAL CARE IF:  You develop a severe headache or confusion.  You have unusual weakness, numbness, or feel faint.  You have severe chest or abdominal pain.  You vomit repeatedly.  You have trouble breathing. MAKE SURE YOU:   Understand these  instructions.  Will watch your condition.  Will get help right away if you are not doing well or get worse. Document Released: 07/21/2005 Document Revised: 12/05/2013 Document Reviewed: 05/13/2013 [facility] Patient Information 2015 [place], [place]. This information is not intended to replace advice given to you by your health care provider. Make sure you discuss any questions you have with your health care provider.

## 2014-03-06 ENCOUNTER — Encounter: Payer: Self-pay | Admitting: Family Medicine

## 2014-03-06 NOTE — Assessment & Plan Note (Signed)
Tolerating statin, encouraged heart healthy diet, avoid trans fats, minimize simple carbs and saturated fats. Increase exercise as tolerated 

## 2014-03-06 NOTE — Assessment & Plan Note (Signed)
Well controlled, no changes to meds. Encouraged heart healthy diet such as the DASH diet and exercise as tolerated.  °

## 2014-03-06 NOTE — Assessment & Plan Note (Signed)
Avoid offending foods, start probiotics. Do not eat large meals in late evening and consider raising head of bed.  

## 2014-03-06 NOTE — Progress Notes (Signed)
Patient ID: [name], male   DOB: [birth year], 68 y.o.   MRN: [id number] EDON HOADLEY [id number] April 09, 1946 03/06/2014      Progress Note-Follow Up  Subjective  Chief Complaint  Chief Complaint  Patient presents with  . Follow-up    10 week     HPI  Patient is a 68 year old male in today for routine medical care. No recent illness. Has noted some bowel changes, used to struggle with severall loose stool daily now having some trouble with hard, difficult to move BM. No bloody or tarry good noted. Denies CP/palp/SOB/HA/congestion/fevers/GI or GU c/o. Taking meds as prescribed  Past Medical History  Diagnosis Date  . Arthritis   . Diabetes mellitus   . History of depression   . GERD (gastroesophageal reflux disease)   . CAD (coronary artery disease)     2003- cabg  . Hypertension   . Hyperlipidemia   . History of kidney stones   . COPD (chronic obstructive pulmonary disease)   . HIV (human immunodeficiency virus infection)   . Dermatitis 11/27/2012  . Nocturia 03/06/2013  . Epicondylitis 06/05/2013    right    Past Surgical History  Procedure Laterality Date  . Coronary artery bypass graft  05/2002  . Vasectomy      Family History  Problem Relation Age of Onset  . Arthritis      mother/father/paternal grandparents  . Breast cancer Maternal Aunt     paternal aunt  . Lung cancer Maternal Aunt   . Hyperlipidemia Mother   . Hyperlipidemia Father   . Heart disease      parents/maternal grandparents/ 2 brothers  . Stroke Paternal Grandmother   . Hypertension Father     paternal grandmother/3 brothers/1 sister  . Mental retardation Sister   . Diabetes Mother     paternal grandparents/1 brother    History   Social History  . Marital Status: Divorced    Spouse Name: N/A    Number of Children: 41  . Years of Education: N/A   Occupational History  . Retired     Disability   Social History Main Topics  . Smoking status: Never Smoker   . Smokeless tobacco:  Never Used  . Alcohol Use: No  . Drug Use: No  . Sexual Activity: Not Currently     Comment: declined condoms   Other Topics Concern  . Not on file   Social History Narrative  . No narrative on file    Current Outpatient Prescriptions on File Prior to Visit  Medication Sig Dispense Refill  . amLODipine (NORVASC) 2.5 MG tablet TAKE 1 TABLET BY MOUTH DAILY  90 tablet  0  . aspirin (ASPIRIN CHILDRENS) 81 MG chewable tablet Chew 1 tablet (81 mg total) by mouth daily.  30 tablet  11  . atorvastatin (LIPITOR) 10 MG tablet Take 1 tablet (10 mg total) by mouth daily.  30 tablet  11  . b complex vitamins tablet Take 1 tablet by mouth daily.      Marland Kitchen elvitegravir-cobicistat-emtricitabine-tenofovir (STRIBILD) 150-150-200-300 MG TABS tablet Take 1 tablet by mouth daily with breakfast.  30 tablet  11  . fenofibrate 160 MG tablet TAKE 1 TABLET BY MOUTH EVERY DAY  30 tablet  0  . GLUCERNA (GLUCERNA) LIQD Take 1 Can by mouth 2 (two) times daily between meals.  48 Can  5  . HYDROcodone-acetaminophen (NORCO/VICODIN) 5-325 MG per tablet Take 1-2 tablets by mouth every 4 (four) hours as needed  for moderate pain.  30 tablet  0  . KRILL OIL OMEGA-3 PO Take 1 capsule by mouth daily.      . Lancets (FREESTYLE) lancets USE TO CHECK BLOOD SUGAR EVERY DAY  100 each  0  . lisinopril-hydrochlorothiazide (PRINZIDE,ZESTORETIC) 20-12.5 MG per tablet TAKE 2 TABLETS BY MOUTH EVERY DAY  60 tablet  0  . Misc Natural Products (FIBER SUPREME PO) Take 10 mLs by mouth daily.      Marland Kitchen omeprazole (PRILOSEC) 20 MG capsule TAKE 1 CAPSULE BY MOUTH EVERY DAY  30 capsule  11  . valACYclovir (VALTREX) 500 MG tablet TAKE 1 TABLET BY MOUTH TWICE DAILY  60 tablet  0  . Pancrelipase, Lip-Prot-Amyl, 24000 UNITS CPEP Take 1 capsule (24,000 Units total) by mouth 3 (three) times daily.  36 capsule  0   No current facility-administered medications on file prior to visit.    No Known Allergies  Review of Systems  Review of Systems   Constitutional: Negative for fever and malaise/fatigue.  HENT: Negative for congestion.   Eyes: Negative for discharge.  Respiratory: Negative for shortness of breath.   Cardiovascular: Negative for chest pain, palpitations and leg swelling.  Gastrointestinal: Negative for nausea, abdominal pain and diarrhea.  Genitourinary: Negative for dysuria.  Musculoskeletal: Negative for falls.  Skin: Negative for rash.  Neurological: Negative for loss of consciousness and headaches.  Endo/Heme/Allergies: Negative for polydipsia.  Psychiatric/Behavioral: Negative for depression and suicidal ideas. The patient is not nervous/anxious and does not have insomnia.     Objective  BP 98/60  Pulse 64  Temp(Src) 98.6 F (37 C) (Oral)  Ht 5\' 3"  (1.6 m)  Wt 114 lb (51.71 kg)  BMI 20.20 kg/m2  SpO2 98%  Physical Exam Physical Exam  Constitutional: He is oriented to person, place, and time and well-developed, well-nourished, and in no distress. No distress.  HENT:  Head: Normocephalic and atraumatic.  Eyes: Conjunctivae are normal.  Neck: Neck supple. No thyromegaly present.  Cardiovascular: Normal rate, regular rhythm and normal heart sounds.   No murmur heard. Pulmonary/Chest: Effort normal and breath sounds normal. No respiratory distress.  Abdominal: He exhibits no distension and no mass. There is no tenderness.  Musculoskeletal: He exhibits no edema.  Neurological: He is alert and oriented to person, place, and time.  Skin: Skin is warm.  Psychiatric: Memory, affect and judgment normal.    Lab Results  Component Value Date   TSH 1.512 02/20/2014   Lab Results  Component Value Date   WBC 4.8 02/20/2014   HGB 13.2 02/20/2014   HCT 37.7* 02/20/2014   MCV 96.9 02/20/2014   PLT 187 02/20/2014   Lab Results  Component Value Date   CREATININE 0.87 02/20/2014   BUN 17 02/20/2014   NA 137 02/20/2014   K 4.2 02/20/2014   CL 102 02/20/2014   CO2 24 02/20/2014   Lab Results  Component Value  Date   ALT 31 02/20/2014   AST 25 02/20/2014   ALKPHOS 46 02/20/2014   BILITOT 0.3 02/20/2014   Lab Results  Component Value Date   CHOL 115 02/20/2014   Lab Results  Component Value Date   HDL 24* 02/20/2014   Lab Results  Component Value Date   LDLCALC 45 02/20/2014   Lab Results  Component Value Date   TRIG 230* 02/20/2014   Lab Results  Component Value Date   CHOLHDL 4.8 02/20/2014     Assessment & Plan  HYPERTENSION Well controlled, no changes to  meds. Encouraged heart healthy diet such as the DASH diet and exercise as tolerated.   GERD Avoid offending foods, start probiotics. Do not eat large meals in late evening and consider raising head of bed.   DIABETES MELLITUS, TYPE II hgba1c acceptable, minimize simple carbs. Increase exercise as tolerated. Continue current meds  COPD No recent exacerbations. No changes to meds  HLD (hyperlipidemia) Tolerating statin, encouraged heart healthy diet, avoid trans fats, minimize simple carbs and saturated fats. Increase exercise as tolerated

## 2014-03-06 NOTE — Assessment & Plan Note (Signed)
hgba1c acceptable, minimize simple carbs. Increase exercise as tolerated. Continue current meds 

## 2014-03-06 NOTE — Assessment & Plan Note (Signed)
No recent exacerbations. No changes to meds

## 2014-03-20 ENCOUNTER — Encounter: Payer: Self-pay | Admitting: Family Medicine

## 2014-03-22 ENCOUNTER — Encounter: Payer: Self-pay | Admitting: Physician Assistant

## 2014-03-22 ENCOUNTER — Ambulatory Visit (INDEPENDENT_AMBULATORY_CARE_PROVIDER_SITE_OTHER): Payer: Medicare Other | Admitting: Physician Assistant

## 2014-03-22 ENCOUNTER — Inpatient Hospital Stay (HOSPITAL_BASED_OUTPATIENT_CLINIC_OR_DEPARTMENT_OTHER)
Admission: EM | Admit: 2014-03-22 | Discharge: 2014-04-10 | DRG: 438 | Disposition: A | Discharge status: Home / Self Care | Payer: Medicare Other | Attending: Internal Medicine | Admitting: Internal Medicine

## 2014-03-22 ENCOUNTER — Encounter (HOSPITAL_BASED_OUTPATIENT_CLINIC_OR_DEPARTMENT_OTHER): Payer: Self-pay | Admitting: Emergency Medicine

## 2014-03-22 ENCOUNTER — Emergency Department (HOSPITAL_BASED_OUTPATIENT_CLINIC_OR_DEPARTMENT_OTHER): Payer: Medicare Other

## 2014-03-22 VITALS — BP 140/84 | HR 65 | Temp 97.6°F | Resp 14 | Ht 63.0 in | Wt 113.2 lb

## 2014-03-22 DIAGNOSIS — R6520 Severe sepsis without septic shock: Secondary | ICD-10-CM

## 2014-03-22 DIAGNOSIS — A419 Sepsis, unspecified organism: Secondary | ICD-10-CM | POA: Diagnosis present

## 2014-03-22 DIAGNOSIS — M129 Arthropathy, unspecified: Secondary | ICD-10-CM | POA: Diagnosis present

## 2014-03-22 DIAGNOSIS — E119 Type 2 diabetes mellitus without complications: Secondary | ICD-10-CM

## 2014-03-22 DIAGNOSIS — I2510 Atherosclerotic heart disease of native coronary artery without angina pectoris: Secondary | ICD-10-CM

## 2014-03-22 DIAGNOSIS — N170 Acute kidney failure with tubular necrosis: Secondary | ICD-10-CM | POA: Diagnosis present

## 2014-03-22 DIAGNOSIS — R413 Other amnesia: Secondary | ICD-10-CM

## 2014-03-22 DIAGNOSIS — R609 Edema, unspecified: Secondary | ICD-10-CM | POA: Diagnosis not present

## 2014-03-22 DIAGNOSIS — K219 Gastro-esophageal reflux disease without esophagitis: Secondary | ICD-10-CM | POA: Diagnosis present

## 2014-03-22 DIAGNOSIS — R682 Dry mouth, unspecified: Secondary | ICD-10-CM

## 2014-03-22 DIAGNOSIS — K5289 Other specified noninfective gastroenteritis and colitis: Secondary | ICD-10-CM | POA: Diagnosis present

## 2014-03-22 DIAGNOSIS — Z452 Encounter for adjustment and management of vascular access device: Secondary | ICD-10-CM | POA: Diagnosis not present

## 2014-03-22 DIAGNOSIS — T68XXXA Hypothermia, initial encounter: Secondary | ICD-10-CM | POA: Diagnosis present

## 2014-03-22 DIAGNOSIS — Z951 Presence of aortocoronary bypass graft: Secondary | ICD-10-CM | POA: Diagnosis not present

## 2014-03-22 DIAGNOSIS — K810 Acute cholecystitis: Secondary | ICD-10-CM

## 2014-03-22 DIAGNOSIS — I120 Hypertensive chronic kidney disease with stage 5 chronic kidney disease or end stage renal disease: Secondary | ICD-10-CM | POA: Diagnosis present

## 2014-03-22 DIAGNOSIS — A084 Viral intestinal infection, unspecified: Secondary | ICD-10-CM

## 2014-03-22 DIAGNOSIS — F329 Major depressive disorder, single episode, unspecified: Secondary | ICD-10-CM

## 2014-03-22 DIAGNOSIS — I739 Peripheral vascular disease, unspecified: Secondary | ICD-10-CM

## 2014-03-22 DIAGNOSIS — J4489 Other specified chronic obstructive pulmonary disease: Secondary | ICD-10-CM | POA: Diagnosis present

## 2014-03-22 DIAGNOSIS — D631 Anemia in chronic kidney disease: Secondary | ICD-10-CM | POA: Diagnosis present

## 2014-03-22 DIAGNOSIS — E872 Acidosis, unspecified: Secondary | ICD-10-CM

## 2014-03-22 DIAGNOSIS — R634 Abnormal weight loss: Secondary | ICD-10-CM

## 2014-03-22 DIAGNOSIS — B20 Human immunodeficiency virus [HIV] disease: Secondary | ICD-10-CM

## 2014-03-22 DIAGNOSIS — Z79899 Other long term (current) drug therapy: Secondary | ICD-10-CM | POA: Diagnosis not present

## 2014-03-22 DIAGNOSIS — K861 Other chronic pancreatitis: Secondary | ICD-10-CM | POA: Diagnosis present

## 2014-03-22 DIAGNOSIS — J811 Chronic pulmonary edema: Secondary | ICD-10-CM | POA: Diagnosis not present

## 2014-03-22 DIAGNOSIS — N179 Acute kidney failure, unspecified: Secondary | ICD-10-CM | POA: Diagnosis present

## 2014-03-22 DIAGNOSIS — R935 Abnormal findings on diagnostic imaging of other abdominal regions, including retroperitoneum: Secondary | ICD-10-CM | POA: Diagnosis not present

## 2014-03-22 DIAGNOSIS — E1165 Type 2 diabetes mellitus with hyperglycemia: Secondary | ICD-10-CM | POA: Diagnosis present

## 2014-03-22 DIAGNOSIS — I369 Nonrheumatic tricuspid valve disorder, unspecified: Secondary | ICD-10-CM | POA: Diagnosis not present

## 2014-03-22 DIAGNOSIS — J9620 Acute and chronic respiratory failure, unspecified whether with hypoxia or hypercapnia: Secondary | ICD-10-CM | POA: Diagnosis present

## 2014-03-22 DIAGNOSIS — G510 Bell's palsy: Secondary | ICD-10-CM

## 2014-03-22 DIAGNOSIS — K853 Drug induced acute pancreatitis without necrosis or infection: Secondary | ICD-10-CM

## 2014-03-22 DIAGNOSIS — D696 Thrombocytopenia, unspecified: Secondary | ICD-10-CM | POA: Diagnosis present

## 2014-03-22 DIAGNOSIS — Z7982 Long term (current) use of aspirin: Secondary | ICD-10-CM

## 2014-03-22 DIAGNOSIS — K8500 Idiopathic acute pancreatitis without necrosis or infection: Secondary | ICD-10-CM

## 2014-03-22 DIAGNOSIS — R197 Diarrhea, unspecified: Secondary | ICD-10-CM | POA: Diagnosis not present

## 2014-03-22 DIAGNOSIS — H905 Unspecified sensorineural hearing loss: Secondary | ICD-10-CM

## 2014-03-22 DIAGNOSIS — E869 Volume depletion, unspecified: Secondary | ICD-10-CM | POA: Diagnosis not present

## 2014-03-22 DIAGNOSIS — K859 Acute pancreatitis without necrosis or infection, unspecified: Principal | ICD-10-CM | POA: Diagnosis present

## 2014-03-22 DIAGNOSIS — R599 Enlarged lymph nodes, unspecified: Secondary | ICD-10-CM

## 2014-03-22 DIAGNOSIS — R61 Generalized hyperhidrosis: Secondary | ICD-10-CM

## 2014-03-22 DIAGNOSIS — E785 Hyperlipidemia, unspecified: Secondary | ICD-10-CM | POA: Diagnosis present

## 2014-03-22 DIAGNOSIS — T50995A Adverse effect of other drugs, medicaments and biological substances, initial encounter: Secondary | ICD-10-CM | POA: Diagnosis present

## 2014-03-22 DIAGNOSIS — IMO0002 Reserved for concepts with insufficient information to code with codable children: Secondary | ICD-10-CM | POA: Diagnosis present

## 2014-03-22 DIAGNOSIS — A088 Other specified intestinal infections: Secondary | ICD-10-CM

## 2014-03-22 DIAGNOSIS — R141 Gas pain: Secondary | ICD-10-CM | POA: Diagnosis not present

## 2014-03-22 DIAGNOSIS — Z87442 Personal history of urinary calculi: Secondary | ICD-10-CM

## 2014-03-22 DIAGNOSIS — E43 Unspecified severe protein-calorie malnutrition: Secondary | ICD-10-CM | POA: Diagnosis present

## 2014-03-22 DIAGNOSIS — E875 Hyperkalemia: Secondary | ICD-10-CM | POA: Diagnosis not present

## 2014-03-22 DIAGNOSIS — K858 Other acute pancreatitis without necrosis or infection: Secondary | ICD-10-CM

## 2014-03-22 DIAGNOSIS — R590 Localized enlarged lymph nodes: Secondary | ICD-10-CM

## 2014-03-22 DIAGNOSIS — J449 Chronic obstructive pulmonary disease, unspecified: Secondary | ICD-10-CM

## 2014-03-22 DIAGNOSIS — N186 End stage renal disease: Secondary | ICD-10-CM | POA: Diagnosis present

## 2014-03-22 DIAGNOSIS — K529 Noninfective gastroenteritis and colitis, unspecified: Secondary | ICD-10-CM

## 2014-03-22 DIAGNOSIS — A6000 Herpesviral infection of urogenital system, unspecified: Secondary | ICD-10-CM

## 2014-03-22 DIAGNOSIS — I10 Essential (primary) hypertension: Secondary | ICD-10-CM

## 2014-03-22 DIAGNOSIS — G9340 Encephalopathy, unspecified: Secondary | ICD-10-CM | POA: Diagnosis present

## 2014-03-22 DIAGNOSIS — F3289 Other specified depressive episodes: Secondary | ICD-10-CM

## 2014-03-22 DIAGNOSIS — R188 Other ascites: Secondary | ICD-10-CM

## 2014-03-22 DIAGNOSIS — L309 Dermatitis, unspecified: Secondary | ICD-10-CM

## 2014-03-22 DIAGNOSIS — Z87898 Personal history of other specified conditions: Secondary | ICD-10-CM

## 2014-03-22 DIAGNOSIS — N039 Chronic nephritic syndrome with unspecified morphologic changes: Secondary | ICD-10-CM

## 2014-03-22 DIAGNOSIS — E118 Type 2 diabetes mellitus with unspecified complications: Secondary | ICD-10-CM

## 2014-03-22 DIAGNOSIS — Z21 Asymptomatic human immunodeficiency virus [HIV] infection status: Secondary | ICD-10-CM | POA: Diagnosis not present

## 2014-03-22 DIAGNOSIS — E881 Lipodystrophy, not elsewhere classified: Secondary | ICD-10-CM

## 2014-03-22 DIAGNOSIS — E871 Hypo-osmolality and hyponatremia: Secondary | ICD-10-CM | POA: Diagnosis present

## 2014-03-22 DIAGNOSIS — K921 Melena: Secondary | ICD-10-CM

## 2014-03-22 DIAGNOSIS — R351 Nocturia: Secondary | ICD-10-CM

## 2014-03-22 DIAGNOSIS — J90 Pleural effusion, not elsewhere classified: Secondary | ICD-10-CM | POA: Diagnosis not present

## 2014-03-22 DIAGNOSIS — J9600 Acute respiratory failure, unspecified whether with hypoxia or hypercapnia: Secondary | ICD-10-CM | POA: Diagnosis not present

## 2014-03-22 DIAGNOSIS — E878 Other disorders of electrolyte and fluid balance, not elsewhere classified: Secondary | ICD-10-CM

## 2014-03-22 DIAGNOSIS — N19 Unspecified kidney failure: Secondary | ICD-10-CM | POA: Diagnosis not present

## 2014-03-22 HISTORY — DX: Acute pancreatitis without necrosis or infection, unspecified: K85.90

## 2014-03-22 LAB — COMPREHENSIVE METABOLIC PANEL
ALT: 20 U/L (ref 0–53)
ALT: 23 U/L (ref 0–53)
ALT: 32 U/L (ref 0–53)
AST: 29 U/L (ref 0–37)
AST: 35 U/L (ref 0–37)
AST: 50 U/L — AB (ref 0–37)
Albumin: 4.1 g/dL (ref 3.5–5.2)
Albumin: 3.7 g/dL (ref 3.5–5.2)
Albumin: 3.3 g/dL — ABNORMAL LOW (ref 3.5–5.2)
Alkaline Phosphatase: 51 U/L (ref 39–117)
Alkaline Phosphatase: 56 U/L (ref 39–117)
Alkaline Phosphatase: 50 U/L (ref 39–117)
Anion gap: 31 — ABNORMAL HIGH (ref 5–15)
Anion gap: 29 — ABNORMAL HIGH (ref 5–15)
BUN: 60 mg/dL — AB (ref 6–23)
BUN: 69 mg/dL — ABNORMAL HIGH (ref 6–23)
BUN: 69 mg/dL — ABNORMAL HIGH (ref 6–23)
CALCIUM: 8.5 mg/dL (ref 8.4–10.5)
CALCIUM: 7.8 mg/dL — AB (ref 8.4–10.5)
CHLORIDE: 98 meq/L (ref 96–112)
CO2: 12 meq/L — AB (ref 19–32)
CO2: 8 meq/L — AB (ref 19–32)
CO2: 7 mEq/L — CL (ref 19–32)
CREATININE: 7.12 mg/dL — AB (ref 0.50–1.35)
CREATININE: 7.4 mg/dL — AB (ref 0.50–1.35)
Calcium: 8.5 mg/dL (ref 8.4–10.5)
Chloride: 87 mEq/L — ABNORMAL LOW (ref 96–112)
Chloride: 91 mEq/L — ABNORMAL LOW (ref 96–112)
Creatinine, Ser: 7.1 mg/dL — ABNORMAL HIGH (ref 0.50–1.35)
GFR calc Af Amer: 8 mL/min — ABNORMAL LOW (ref >90)
GFR calc non Af Amer: 7 mL/min — ABNORMAL LOW (ref >90)
GFR, EST AFRICAN AMERICAN: 8 mL/min — AB (ref >90)
GFR, EST NON AFRICAN AMERICAN: 7 mL/min — AB (ref >90)
GLUCOSE: 166 mg/dL — AB (ref 70–99)
GLUCOSE: 75 mg/dL (ref 70–99)
Glucose, Bld: 62 mg/dL — ABNORMAL LOW (ref 70–99)
POTASSIUM: 5.7 meq/L — AB (ref 3.5–5.3)
POTASSIUM: 6.4 meq/L — AB (ref 3.7–5.3)
Potassium: 6.2 mEq/L — ABNORMAL HIGH (ref 3.7–5.3)
SODIUM: 127 meq/L — AB (ref 137–147)
Sodium: 128 mEq/L — ABNORMAL LOW (ref 135–145)
Sodium: 126 mEq/L — ABNORMAL LOW (ref 137–147)
TOTAL PROTEIN: 7.5 g/dL (ref 6.0–8.3)
TOTAL PROTEIN: 6.7 g/dL (ref 6.0–8.3)
Total Bilirubin: 0.9 mg/dL (ref 0.2–1.2)
Total Bilirubin: 0.6 mg/dL (ref 0.3–1.2)
Total Bilirubin: 0.7 mg/dL (ref 0.3–1.2)
Total Protein: 6.5 g/dL (ref 6.0–8.3)

## 2014-03-22 LAB — CBC WITH DIFFERENTIAL/PLATELET
BASOS ABS: 0 10*3/uL (ref 0.0–0.1)
Basophils Relative: 0 % (ref 0–1)
EOS PCT: 0 % (ref 0–5)
Eosinophils Absolute: 0 10*3/uL (ref 0.0–0.7)
HCT: 35.7 % — ABNORMAL LOW (ref 39.0–52.0)
Hemoglobin: 12.8 g/dL — ABNORMAL LOW (ref 13.0–17.0)
LYMPHS PCT: 4 % — AB (ref 12–46)
Lymphs Abs: 0.6 10*3/uL — ABNORMAL LOW (ref 0.7–4.0)
MCH: 34.7 pg — AB (ref 26.0–34.0)
MCHC: 35.9 g/dL (ref 30.0–36.0)
MCV: 96.7 fL (ref 78.0–100.0)
MONO ABS: 1.4 10*3/uL — AB (ref 0.1–1.0)
Monocytes Relative: 9 % (ref 3–12)
Neutro Abs: 13.7 10*3/uL — ABNORMAL HIGH (ref 1.7–7.7)
Neutrophils Relative %: 87 % — ABNORMAL HIGH (ref 43–77)
PLATELETS: 164 10*3/uL (ref 150–400)
RBC: 3.69 MIL/uL — ABNORMAL LOW (ref 4.22–5.81)
RDW: 12.5 % (ref 11.5–15.5)
WBC: 15.8 10*3/uL — ABNORMAL HIGH (ref 4.0–10.5)

## 2014-03-22 LAB — LIPASE, BLOOD

## 2014-03-22 MED ORDER — SODIUM POLYSTYRENE SULFONATE 15 GM/60ML PO SUSP
30.0000 g | Freq: Once | ORAL | Status: AC
  Administered 2014-03-22: 30 g via ORAL
  Filled 2014-03-22: qty 120

## 2014-03-22 MED ORDER — INSULIN REGULAR HUMAN 100 UNIT/ML IJ SOLN
5.0000 [IU] | Freq: Once | INTRAMUSCULAR | Status: AC
  Administered 2014-03-22: 5 [IU] via SUBCUTANEOUS
  Filled 2014-03-22: qty 1

## 2014-03-22 MED ORDER — SODIUM CHLORIDE 0.9 % IV SOLN
1.0000 g | Freq: Once | INTRAVENOUS | Status: AC
  Administered 2014-03-22: 1 g via INTRAVENOUS
  Filled 2014-03-22: qty 10

## 2014-03-22 MED ORDER — SODIUM CHLORIDE 0.9 % IV BOLUS (SEPSIS)
1000.0000 mL | Freq: Once | INTRAVENOUS | Status: AC
  Administered 2014-03-22: 1000 mL via INTRAVENOUS

## 2014-03-22 MED ORDER — HYDROMORPHONE HCL PF 1 MG/ML IJ SOLN
1.0000 mg | Freq: Once | INTRAMUSCULAR | Status: AC
  Administered 2014-03-22: 1 mg via INTRAVENOUS
  Filled 2014-03-22: qty 1

## 2014-03-22 MED ORDER — ONDANSETRON HCL 4 MG/2ML IJ SOLN
4.0000 mg | Freq: Once | INTRAMUSCULAR | Status: AC
  Administered 2014-03-22: 4 mg via INTRAVENOUS
  Filled 2014-03-22: qty 2

## 2014-03-22 MED ORDER — ONDANSETRON 8 MG PO TBDP: 8.0000 mg | ORAL_TABLET | Freq: Three times a day (TID) | ORAL | Status: DC | PRN

## 2014-03-22 MED ORDER — DEXTROSE 50 % IV SOLN
50.0000 mL | Freq: Once | INTRAVENOUS | Status: AC
  Administered 2014-03-22: 50 mL via INTRAVENOUS
  Filled 2014-03-22: qty 50

## 2014-03-22 NOTE — ED Provider Notes (Addendum)
CSN: [id number]     Arrival date & time 03/22/14  1920 History  This chart was scribed for Christian Patches, MD by [name], ED Scribe. This patient was seen in room MH09/MH09 and the patient's care was started at 8:52 PM.   Chief Complaint  Patient presents with  . Emesis   Patient is a 68 y.o. male presenting with vomiting. The history is provided by the patient. No language interpreter was used.  Emesis Severity:  Moderate Duration:  4 days Timing:  Intermittent Number of daily episodes:  5-10 Emesis appearance: Greenish color. Progression:  Improving Chronicity:  New Relieved by:  None tried Worsened by:  Nothing tried Ineffective treatments:  None tried Associated symptoms: diarrhea   Associated symptoms: no abdominal pain, no fever and no headaches   Risk factors: no sick contacts    HPI Comments: [name] is a 68 y.o. male who presents to the Emergency Department complaining of emesis that started 3 days ago. He reports  5-10 episodes of emesis a day. He states that his vomit is a green color. He reports associated nausea and diarrhea. He states that he has the same number of episodes of diarrhea as emesis. He reports that his stool is watery with no blood. He states that today he noticed that he cannot ambulate straight and that he has been light headed. He states that he had an open heart surgery in 2003. He reports no antibiotic use since he had pancreatitis in April. He denies any sick contacts. He also denies any dysuria, chest pain, SOB, or fever.   Past Medical History  Diagnosis Date  . Arthritis   . Diabetes mellitus   . History of depression   . GERD (gastroesophageal reflux disease)   . CAD (coronary artery disease)     2003- cabg  . Hypertension   . Hyperlipidemia   . History of kidney stones   . COPD (chronic obstructive pulmonary disease)   . HIV (human immunodeficiency virus infection)   . Dermatitis 11/27/2012  . Nocturia 03/06/2013  . Epicondylitis  06/05/2013    right   Past Surgical History  Procedure Laterality Date  . Coronary artery bypass graft  05/2002  . Vasectomy     Family History  Problem Relation Age of Onset  . Arthritis      mother/father/paternal grandparents  . Breast cancer Maternal Aunt     paternal aunt  . Lung cancer Maternal Aunt   . Hyperlipidemia Mother   . Hyperlipidemia Father   . Heart disease      parents/maternal grandparents/ 2 brothers  . Stroke Paternal Grandmother   . Hypertension Father     paternal grandmother/3 brothers/1 sister  . Mental retardation Sister   . Diabetes Mother     paternal grandparents/1 brother   History  Substance Use Topics  . Smoking status: Never Smoker   . Smokeless tobacco: Never Used  . Alcohol Use: No    Review of Systems  Constitutional: Negative for fever, activity change, appetite change and fatigue.  HENT: Negative for congestion, facial swelling, rhinorrhea and trouble swallowing.   Eyes: Negative for photophobia and pain.  Respiratory: Negative for cough, chest tightness and shortness of breath.   Cardiovascular: Negative for chest pain and leg swelling.  Gastrointestinal: Positive for nausea, vomiting and diarrhea. Negative for abdominal pain and constipation.  Endocrine: Negative for polydipsia and polyuria.  Genitourinary: Negative for dysuria, urgency, decreased urine volume and difficulty urinating.  Musculoskeletal: Negative  for back pain and gait problem.  Skin: Negative for color change, rash and wound.  Allergic/Immunologic: Negative for immunocompromised state.  Neurological: Positive for light-headedness. Negative for dizziness, facial asymmetry, speech difficulty, weakness, numbness and headaches.  Psychiatric/Behavioral: Negative for confusion, decreased concentration and agitation.    Allergies  Review of patient's allergies indicates no known allergies.  Home Medications   Prior to Admission medications   Medication Sig Start  Date End Date Taking? Authorizing Provider  amLODipine (NORVASC) 2.5 MG tablet TAKE 1 TABLET BY MOUTH DAILY    Mosie Lukes, MD  aspirin (ASPIRIN CHILDRENS) 81 MG chewable tablet Chew 1 tablet (81 mg total) by mouth daily. 10/28/12   Truman Hayward, MD  atenolol (TENORMIN) 50 MG tablet Take 1 tablet (50 mg total) by mouth daily. 02/28/14   Mosie Lukes, MD  atorvastatin (LIPITOR) 10 MG tablet Take 1 tablet (10 mg total) by mouth daily. 12/15/13   Michel Bickers, MD  b complex vitamins tablet Take 1 tablet by mouth daily.    Historical Provider, MD  elvitegravir-cobicistat-emtricitabine-tenofovir (STRIBILD) 150-150-200-300 MG TABS tablet Take 1 tablet by mouth daily with breakfast. 02/27/14   Carlyle Basques, MD  fenofibrate 160 MG tablet TAKE 1 TABLET BY MOUTH EVERY DAY    Mosie Lukes, MD  GLUCERNA [facility]) LIQD Take 1 Can by mouth 2 (two) times daily between meals. 04/20/13   Truman Hayward, MD  HYDROcodone-acetaminophen (NORCO/VICODIN) 5-325 MG per tablet Take 1-2 tablets by mouth every 4 (four) hours as needed for moderate pain. 01/25/14   Dorie Rank, MD  KRILL OIL OMEGA-3 PO Take 1 capsule by mouth daily.    Historical Provider, MD  Lancets (FREESTYLE) lancets USE TO CHECK BLOOD SUGAR EVERY DAY 01/05/14   Mosie Lukes, MD  lisinopril-hydrochlorothiazide ([place]) 20-12.5 MG per tablet TAKE 2 TABLETS BY MOUTH EVERY DAY    Mosie Lukes, MD  metFORMIN (GLUMETZA) 1000 MG (MOD) 24 hr tablet Take 1 tablet (1,000 mg total) by mouth 2 (two) times daily with a meal. Had diarrhea on Metformin short acting 02/28/14   Mosie Lukes, MD  Misc Natural Products (FIBER SUPREME PO) Take 10 mLs by mouth daily.    Historical Provider, MD  omeprazole (PRILOSEC) 20 MG capsule TAKE 1 CAPSULE BY MOUTH EVERY DAY 01/30/14   Michel Bickers, MD  ondansetron (ZOFRAN-ODT) 8 MG disintegrating tablet Take 1 tablet (8 mg total) by mouth every 8 (eight) hours as needed for nausea. 03/22/14   Brunetta Jeans, PA-C  Pancrelipase, Lip-Prot-Amyl, 24000 UNITS CPEP Take 1 capsule (24,000 Units total) by mouth 3 (three) times daily. 12/16/13   Mosie Lukes, MD  Probiotic Product (PROBIOTIC DAILY PO) Take by mouth daily.    Historical Provider, MD  valACYclovir (VALTREX) 500 MG tablet TAKE 1 TABLET BY MOUTH TWICE DAILY    Michel Bickers, MD   BP 128/66  Pulse 75  Temp(Src) 97.4 F (36.3 C) (Oral)  Resp 18  Ht 5\' 3"  (1.6 m)  Wt 114 lb (51.71 kg)  BMI 20.20 kg/m2  SpO2 100% Physical Exam  Nursing note and vitals reviewed. Constitutional: He is oriented to person, place, and time. He appears well-developed and well-nourished. No distress.  HENT:  Head: Normocephalic and atraumatic.  Mouth/Throat: No oropharyngeal exudate.  Eyes: Pupils are equal, round, and reactive to light.  Neck: Normal range of motion. Neck supple.  Cardiovascular: Normal rate, regular rhythm and normal heart sounds.  Exam reveals no  gallop and no friction rub.   No murmur heard. Pulmonary/Chest: Effort normal and breath sounds normal. No respiratory distress. He has no wheezes. He has no rales.  Abdominal: Soft. Bowel sounds are normal. He exhibits no distension and no mass. There is tenderness. There is no rebound and no guarding.  Generalized. Soft reducible umbilical hernia.   Musculoskeletal: Normal range of motion. He exhibits no edema and no tenderness.  Neurological: He is alert and oriented to person, place, and time.  Skin: Skin is warm and dry.  Psychiatric: He has a normal mood and affect.    ED Course  Procedures (including critical care time) DIAGNOSTIC STUDIES: Oxygen Saturation is 100% on RA, normal by my interpretation.    COORDINATION OF CARE: 8:56 PM- Pt advised of plan for treatment which includes medication and labs and pt agrees.  Labs Review Labs Reviewed  CBC WITH DIFFERENTIAL - Abnormal; Notable for the following:    WBC 15.8 (*)    RBC 3.69 (*)    Hemoglobin 12.8 (*)    HCT 35.7  (*)    MCH 34.7 (*)    Neutrophils Relative % 87 (*)    Neutro Abs 13.7 (*)    Lymphocytes Relative 4 (*)    Lymphs Abs 0.6 (*)    Monocytes Absolute 1.4 (*)    All other components within normal limits  COMPREHENSIVE METABOLIC PANEL - Abnormal; Notable for the following:    Sodium 126 (*)    Potassium 6.2 (*)    Chloride 87 (*)    CO2 8 (*)    BUN 69 (*)    Creatinine, Ser 7.40 (*)    GFR calc non Af Amer 7 (*)    GFR calc Af Amer 8 (*)    Anion gap 31 (*)    All other components within normal limits  LIPASE, BLOOD - Abnormal; Notable for the following:    Lipase >3000 (*)    All other components within normal limits  COMPREHENSIVE METABOLIC PANEL - Abnormal; Notable for the following:    Sodium 127 (*)    Potassium 6.4 (*)    Chloride 91 (*)    CO2 7 (*)    Glucose, Bld 62 (*)    BUN 69 (*)    Creatinine, Ser 7.10 (*)    Calcium 7.8 (*)    Albumin 3.3 (*)    AST 50 (*)    GFR calc non Af Amer 7 (*)    GFR calc Af Amer 8 (*)    Anion gap 29 (*)    All other components within normal limits  CK  BLOOD GAS, VENOUS  I-STAT CG4 LACTIC ACID, ED    Imaging Review Ct Abdomen Pelvis Wo Contrast  03/22/2014   CLINICAL DATA:  Nausea, vomiting and diarrhea.  EXAM: CT ABDOMEN AND PELVIS WITHOUT CONTRAST  TECHNIQUE: Multidetector CT imaging of the abdomen and pelvis was performed following the standard protocol without IV contrast.  COMPARISON:  11/15/2013  FINDINGS: The lung bases are grossly clear.  The liver is grossly normal and stable. Small calcified granulomas are noted. The gallbladder is grossly normal. No common bile duct dilatation. The pancreas is grossly normal. The spleen is normal in size. No focal lesions. The adrenal glands and kidneys are unremarkable without contrast. No hydronephrosis or obstructing ureteral calculi.  The stomach is moderately distended and there is interstitial changes around the stomach. Could not exclude partial gastric outlet obstruction. Mild  wall thickening involving the  duodenum but no obvious ulceration. No dilatation of the small bowel or colon. There is mesenteric edema and a small amount of abdominal and pelvic fluid which was also present on the prior study.  No mesenteric or retroperitoneal mass or adenopathy. The aorta is normal in caliber.  The bladder, prostate gland and seminal vesicles are unremarkable. No pelvic mass or adenopathy. There is a small amount of free pelvic fluid. No inguinal mass or adenopathy. Mild diffuse body wall edema.  IMPRESSION: 1. Distended stomach with surrounding interstitial change in fluid. Could not exclude a partial gastric outlet obstruction. 2. Mesenteric edema and scattered abdominal and pelvic fluid appears relatively stable when compared to prior CT scan. 3. Body wall edema.   Electronically Signed   By: Kalman Jewels M.D.   On: 03/22/2014 23:32     EKG Interpretation   Date/Time:  Wednesday March 22 2014 22:29:58 EDT Ventricular Rate:  69 PR Interval:  212 QRS Duration: 108 QT Interval:  442 QTC Calculation: 473 R Axis:   48 Text Interpretation:  Sinus rhythm with 1st degree A-V block Otherwise  normal ECG T waves are more prominent than prior Confirmed by [place] [phone]) on 03/22/2014 10:34:13 PM     CRITICAL CARE Performed by: [name], E Total critical care time: 35 Critical care time was exclusive of separately billable procedures and treating other patients. Critical care was necessary to treat or prevent imminent or life-threatening deterioration. Critical care was time spent personally by me on the following activities: development of treatment plan with patient and/or surrogate as well as nursing, discussions with consultants, evaluation of patient's response to treatment, examination of patient, obtaining history from patient or surrogate, ordering and performing treatments and interventions, ordering and review of laboratory studies, ordering and review  of radiographic studies, pulse oximetry and re-evaluation of patient's condition.   MDM   Final diagnoses:  Multiorgan failure  Acute pancreatitis, unspecified pancreatitis type  Acute renal failure, unspecified acute renal failure type  Hyperkalemia  Hyponatremia  Hypochloremia  Lactic acidosis    Pt is a 68 y.o. male with Pmhx as above who presents with several days of n/a, and d/a. He reports ab pain only w/ vomiting. He denies fever. He has hx of pancreatitis, but states this is not the same as those symptoms. No recent illness, abx use, travel. On PE, VSS. He has generalized abdominal ttp w/o rebound or guarding.   Lab w/u concerning with multiple severe e-lyte disturbances, ARF, lipase >6S, VBG w Metabolic acidosis, LA of 0.63. CT ordered which showed abdominal distention, cannot r/o gastic outlet obstruction, however pt reporting recent d/a. Hyperkalemia treated w/ Caglu, insulin, dextrose, IVF, kayexalate. [facility] and CCM consulted, CCM will admit to the ICU.  CCM rec pt be started on bicarb ggt prior to transport and he given 3rd L NS. Pt VS remain stable, and he reports feeling much better after IVF and 1mg  IV dilaudid.    I personally performed the services described in this documentation, which was scribed in my presence. The recorded information has been reviewed and is accurate.      Christian Patches, MD 03/23/14 0160  Christian Patches, MD 03/23/14 1093

## 2014-03-22 NOTE — Progress Notes (Signed)
Patient presents to clinic today c/o 2-3 days of nausea and vomiting.  Patient denies fever, aches.  Endorses chills since this AM.  Patient endorses occasional loose stool.  Denies tenesmus, melena or hematochezia.  Denies recent travel. Denies sick contact.  Has history of HIV that he endorses is well controlled.  Has been having some difficulty this morning with keeping down PO fluids.  Past Medical History  Diagnosis Date  . Arthritis   . Diabetes mellitus   . History of depression   . GERD (gastroesophageal reflux disease)   . CAD (coronary artery disease)     2003- cabg  . Hypertension   . Hyperlipidemia   . History of kidney stones   . COPD (chronic obstructive pulmonary disease)   . HIV (human immunodeficiency virus infection) 1991    on meds since initial dx.   . Dermatitis 11/27/2012  . Nocturia 03/06/2013  . Epicondylitis 06/05/2013    right  . Pancreatitis 11/2013    attributed to HIV meds.     No current facility-administered medications on file prior to visit.   Current Outpatient Prescriptions on File Prior to Visit  Medication Sig Dispense Refill  . b complex vitamins tablet Take 1 tablet by mouth daily.      Marland Kitchen GLUCERNA (GLUCERNA) LIQD Take 1 Can by mouth 2 (two) times daily between meals.  48 Can  5  . KRILL OIL OMEGA-3 PO Take 1 capsule by mouth daily.      . Lancets (FREESTYLE) lancets USE TO CHECK BLOOD SUGAR EVERY DAY  100 each  0  . Misc Natural Products (FIBER SUPREME PO) Take 10 mLs by mouth daily.      . Probiotic Product (PROBIOTIC DAILY PO) Take 1 tablet by mouth daily.         No Known Allergies  Family History  Problem Relation Age of Onset  . Arthritis      mother/father/paternal grandparents  . Breast cancer Maternal Aunt     paternal aunt  . Lung cancer Maternal Aunt   . Hyperlipidemia Mother   . Hyperlipidemia Father   . Heart disease      parents/maternal grandparents/ 2 brothers  . Stroke Paternal Grandmother   . Hypertension Father    paternal grandmother/3 brothers/1 sister  . Mental retardation Sister   . Diabetes Mother     paternal grandparents/1 brother    History   Social History  . Marital Status: Divorced    Spouse Name: N/A    Number of Children: 90  . Years of Education: N/A   Occupational History  . 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    Social History Main Topics  . Smoking status: Never Smoker   . Smokeless tobacco: Never Used  . Alcohol Use: No  . Drug Use: No  . Sexual Activity: Not Currently     Comment: declined condoms   Other Topics Concern  . None   Social History Narrative   Lives alone.  Supportive friends and family.  His HIV Dx is not a secret.    Review of Systems - See HPI.  All other ROS are negative.  BP 140/84  Pulse 65  Temp(Src) 97.6 F (36.4 C)  Resp 14  Ht 5\' 3"  (1.6 m)  Wt 113 lb 4 oz (51.37 kg)  BMI 20.07 kg/m2  SpO2 99%  Physical Exam  Vitals reviewed. Constitutional: He is oriented to person, place, and  time.  Cachectic elderly gentleman in no acute distress, sitting on examination table.   HENT:  Head: Normocephalic and atraumatic.  Right Ear: External ear normal.  Left Ear: External ear normal.  Nose: Nose normal.  Mouth/Throat: Oropharynx is clear and moist. No oropharyngeal exudate.  TM within normal limits bilaterally.  Eyes: Conjunctivae are normal. Pupils are equal, round, and reactive to light.  Neck: Neck supple.  Cardiovascular: Normal rate, regular rhythm, normal heart sounds and intact distal pulses.   Pulmonary/Chest: Effort normal and breath sounds normal. No respiratory distress. He has no wheezes. He has no rales. He exhibits no tenderness.  Abdominal: Soft. Bowel sounds are normal. He exhibits no distension and no mass. There is no rebound and no guarding.  Mild diffuse abdominal tenderness without rebound or guarding.  Lymphadenopathy:       Head (right side): Occipital adenopathy present. No  submental, no submandibular, no tonsillar, no preauricular and no posterior auricular adenopathy present.       Head (left side): Occipital adenopathy present. No submental, no submandibular, no tonsillar, no preauricular and no posterior auricular adenopathy present.    He has cervical adenopathy.       Right cervical: Posterior cervical adenopathy present.       Left cervical: Posterior cervical adenopathy present.    He has no axillary adenopathy.       Right: No supraclavicular and no epitrochlear adenopathy present.       Left: No supraclavicular and no epitrochlear adenopathy present.  Neurological: He is alert and oriented to person, place, and time.  Skin: Skin is warm and dry. No rash noted.     Assessment/Plan: Viral gastroenteritis Rx zofran.  Increase fluid intake.  Ginger ale may also be helpful with nausea.  Diet ofr Diarrhea given.  Immodium PRN.  Will obtain labs giving extensive history.  Some concern for dehydration.  Labs ordered STAT but patient instructed to proceed to the ER if still unable to tolerate fluids with Zofran.  Posterior cervical lymphadenopathy Will obtain labs to include CMV and EBV.  Patient HIV +.

## 2014-03-22 NOTE — ED Notes (Signed)
Vomiting and diarrhea since Sunday.

## 2014-03-22 NOTE — Patient Instructions (Signed)
Please obtain labs.  Increase your fluid intake.  Zofran will help with nausea and allow you to keep fluids down.  Gingerale may also help with this.  You can take Immodium if needed for loose stools.  If you get to where you cannot keep fluids down, I want you to go to the ER for evaluation and IV fluids.  Food Choices to Help Relieve Diarrhea When you have diarrhea, the foods you eat and your eating habits are very important. Choosing the right foods and drinks can help relieve diarrhea. Also, because diarrhea can last up to 7 days, you need to replace lost fluids and electrolytes (such as sodium, potassium, and chloride) in order to help prevent dehydration.  WHAT GENERAL GUIDELINES DO I NEED TO FOLLOW?  Slowly drink 1 cup (8 oz) of fluid for each episode of diarrhea. If you are getting enough fluid, your urine will be clear or pale yellow.  Eat starchy foods. Some good choices include white rice, white toast, pasta, low-fiber cereal, baked potatoes (without the skin), saltine crackers, and bagels.  Avoid large servings of any cooked vegetables.  Limit fruit to two servings per day. A serving is  cup or 1 small piece.  Choose foods with less than 2 g of fiber per serving.  Limit fats to less than 8 tsp (38 g) per day.  Avoid fried foods.  Eat foods that have probiotics in them. Probiotics can be found in certain dairy products.  Avoid foods and beverages that may increase the speed at which food moves through the stomach and intestines (gastrointestinal tract). Things to avoid include:  High-fiber foods, such as dried fruit, raw fruits and vegetables, nuts, seeds, and whole grain foods.  Spicy foods and high-fat foods.  Foods and beverages sweetened with high-fructose corn syrup, honey, or sugar alcohols such as xylitol, sorbitol, and mannitol. WHAT FOODS ARE RECOMMENDED? Grains White rice. White, [place], or pita breads (fresh or toasted), including plain rolls, buns, or bagels.  White pasta. Saltine, soda, or graham crackers. Pretzels. Low-fiber cereal. Cooked cereals made with water (such as cornmeal, farina, or cream cereals). Plain muffins. Matzo. Melba toast. Zwieback.  Vegetables Potatoes (without the skin). Strained tomato and vegetable juices. Most well-cooked and canned vegetables without seeds. Tender lettuce. Fruits Cooked or canned applesauce, apricots, cherries, fruit cocktail, grapefruit, peaches, pears, or plums. Fresh bananas, apples without skin, cherries, grapes, cantaloupe, grapefruit, peaches, oranges, or plums.  Meat and Other Protein Products Baked or boiled chicken. Eggs. Tofu. Fish. Seafood. Smooth peanut butter. Ground or well-cooked tender beef, ham, veal, lamb, pork, or poultry.  Dairy Plain yogurt, kefir, and unsweetened liquid yogurt. Lactose-free milk, buttermilk, or soy milk. Plain hard cheese. Beverages Sport drinks. Clear broths. Diluted fruit juices (except prune). Regular, caffeine-free sodas such as ginger ale. Water. Decaffeinated teas. Oral rehydration solutions. Sugar-free beverages not sweetened with sugar alcohols. Other Bouillon, broth, or soups made from recommended foods.  The items listed above may not be a complete list of recommended foods or beverages. Contact your dietitian for more options. WHAT FOODS ARE NOT RECOMMENDED? Grains Whole grain, whole wheat, bran, or rye breads, rolls, pastas, crackers, and cereals. Wild or brown rice. Cereals that contain more than 2 g of fiber per serving. Corn tortillas or taco shells. Cooked or dry oatmeal. Granola. Popcorn. Vegetables Raw vegetables. Cabbage, broccoli, Brussels sprouts, artichokes, baked beans, beet greens, corn, kale, legumes, peas, sweet potatoes, and yams. Potato skins. Cooked spinach and cabbage. Fruits Dried fruit, including raisins  and dates. Raw fruits. Stewed or dried prunes. Fresh apples with skin, apricots, mangoes, pears, raspberries, and strawberries.  Meat  and Other Protein Products Chunky peanut butter. Nuts and seeds. Beans and lentils. Berniece Salines.  Dairy High-fat cheeses. Milk, chocolate milk, and beverages made with milk, such as milk shakes. Cream. Ice cream. Sweets and Desserts Sweet rolls, doughnuts, and sweet breads. Pancakes and waffles. Fats and Oils Butter. Cream sauces. Margarine. Salad oils. Plain salad dressings. Olives. Avocados.  Beverages Caffeinated beverages (such as coffee, tea, soda, or energy drinks). Alcoholic beverages. Fruit juices with pulp. Prune juice. Soft drinks sweetened with high-fructose corn syrup or sugar alcohols. Other Coconut. Hot sauce. Chili powder. Mayonnaise. Gravy. Cream-based or milk-based soups.  The items listed above may not be a complete list of foods and beverages to avoid. Contact your dietitian for more information. WHAT SHOULD I DO IF I BECOME DEHYDRATED? Diarrhea can sometimes lead to dehydration. Signs of dehydration include dark urine and dry mouth and skin. If you think you are dehydrated, you should rehydrate with an oral rehydration solution. These solutions can be purchased at pharmacies, retail stores, or online.  Drink -1 cup (120-240 mL) of oral rehydration solution each time you have an episode of diarrhea. If drinking this amount makes your diarrhea worse, try drinking smaller amounts more often. For example, drink 1-3 tsp (5-15 mL) every 5-10 minutes.  A general rule for staying hydrated is to drink 1-2 L of fluid per day. Talk to your health care provider about the specific amount you should be drinking each day. Drink enough fluids to keep your urine clear or pale yellow. Document Released: 10/11/2003 Document Revised: 07/26/2013 Document Reviewed: 06/13/2013 [facility] Patient Information 2015 [place], [place]. This information is not intended to replace advice given to you by your health care provider. Make sure you discuss any questions you have with your health care provider.

## 2014-03-22 NOTE — ED Notes (Signed)
Patient transported to CT 

## 2014-03-22 NOTE — Progress Notes (Signed)
Pre visit review using our clinic review tool, if applicable. No additional management support is needed unless otherwise documented below in the visit note/SLS  

## 2014-03-23 ENCOUNTER — Telehealth: Payer: Self-pay | Admitting: *Deleted

## 2014-03-23 ENCOUNTER — Inpatient Hospital Stay (HOSPITAL_COMMUNITY): Payer: Medicare Other

## 2014-03-23 DIAGNOSIS — R141 Gas pain: Secondary | ICD-10-CM | POA: Diagnosis not present

## 2014-03-23 DIAGNOSIS — I120 Hypertensive chronic kidney disease with stage 5 chronic kidney disease or end stage renal disease: Secondary | ICD-10-CM | POA: Diagnosis present

## 2014-03-23 DIAGNOSIS — E871 Hypo-osmolality and hyponatremia: Secondary | ICD-10-CM

## 2014-03-23 DIAGNOSIS — E872 Acidosis, unspecified: Secondary | ICD-10-CM

## 2014-03-23 DIAGNOSIS — E875 Hyperkalemia: Secondary | ICD-10-CM

## 2014-03-23 DIAGNOSIS — K529 Noninfective gastroenteritis and colitis, unspecified: Secondary | ICD-10-CM

## 2014-03-23 DIAGNOSIS — Z452 Encounter for adjustment and management of vascular access device: Secondary | ICD-10-CM | POA: Diagnosis not present

## 2014-03-23 DIAGNOSIS — G9340 Encephalopathy, unspecified: Secondary | ICD-10-CM | POA: Diagnosis present

## 2014-03-23 DIAGNOSIS — N186 End stage renal disease: Secondary | ICD-10-CM | POA: Diagnosis present

## 2014-03-23 DIAGNOSIS — K219 Gastro-esophageal reflux disease without esophagitis: Secondary | ICD-10-CM | POA: Diagnosis present

## 2014-03-23 DIAGNOSIS — K861 Other chronic pancreatitis: Secondary | ICD-10-CM | POA: Diagnosis present

## 2014-03-23 DIAGNOSIS — E1165 Type 2 diabetes mellitus with hyperglycemia: Secondary | ICD-10-CM | POA: Diagnosis present

## 2014-03-23 DIAGNOSIS — E869 Volume depletion, unspecified: Secondary | ICD-10-CM | POA: Diagnosis not present

## 2014-03-23 DIAGNOSIS — J9620 Acute and chronic respiratory failure, unspecified whether with hypoxia or hypercapnia: Secondary | ICD-10-CM

## 2014-03-23 DIAGNOSIS — N179 Acute kidney failure, unspecified: Secondary | ICD-10-CM | POA: Diagnosis present

## 2014-03-23 DIAGNOSIS — D696 Thrombocytopenia, unspecified: Secondary | ICD-10-CM | POA: Diagnosis present

## 2014-03-23 DIAGNOSIS — E43 Unspecified severe protein-calorie malnutrition: Secondary | ICD-10-CM

## 2014-03-23 DIAGNOSIS — J9600 Acute respiratory failure, unspecified whether with hypoxia or hypercapnia: Secondary | ICD-10-CM | POA: Diagnosis not present

## 2014-03-23 DIAGNOSIS — B20 Human immunodeficiency virus [HIV] disease: Secondary | ICD-10-CM | POA: Diagnosis present

## 2014-03-23 DIAGNOSIS — IMO0002 Reserved for concepts with insufficient information to code with codable children: Secondary | ICD-10-CM | POA: Diagnosis present

## 2014-03-23 DIAGNOSIS — N170 Acute kidney failure with tubular necrosis: Secondary | ICD-10-CM | POA: Diagnosis present

## 2014-03-23 DIAGNOSIS — I2510 Atherosclerotic heart disease of native coronary artery without angina pectoris: Secondary | ICD-10-CM | POA: Diagnosis present

## 2014-03-23 DIAGNOSIS — R935 Abnormal findings on diagnostic imaging of other abdominal regions, including retroperitoneum: Secondary | ICD-10-CM | POA: Diagnosis not present

## 2014-03-23 DIAGNOSIS — A419 Sepsis, unspecified organism: Secondary | ICD-10-CM | POA: Diagnosis present

## 2014-03-23 DIAGNOSIS — J449 Chronic obstructive pulmonary disease, unspecified: Secondary | ICD-10-CM | POA: Diagnosis present

## 2014-03-23 DIAGNOSIS — N039 Chronic nephritic syndrome with unspecified morphologic changes: Secondary | ICD-10-CM | POA: Diagnosis present

## 2014-03-23 DIAGNOSIS — D631 Anemia in chronic kidney disease: Secondary | ICD-10-CM | POA: Diagnosis present

## 2014-03-23 DIAGNOSIS — N19 Unspecified kidney failure: Secondary | ICD-10-CM | POA: Diagnosis not present

## 2014-03-23 DIAGNOSIS — Z951 Presence of aortocoronary bypass graft: Secondary | ICD-10-CM | POA: Diagnosis not present

## 2014-03-23 DIAGNOSIS — M129 Arthropathy, unspecified: Secondary | ICD-10-CM | POA: Diagnosis present

## 2014-03-23 DIAGNOSIS — T50995A Adverse effect of other drugs, medicaments and biological substances, initial encounter: Secondary | ICD-10-CM | POA: Diagnosis present

## 2014-03-23 DIAGNOSIS — I369 Nonrheumatic tricuspid valve disorder, unspecified: Secondary | ICD-10-CM | POA: Diagnosis not present

## 2014-03-23 DIAGNOSIS — R197 Diarrhea, unspecified: Secondary | ICD-10-CM | POA: Diagnosis not present

## 2014-03-23 DIAGNOSIS — E785 Hyperlipidemia, unspecified: Secondary | ICD-10-CM | POA: Diagnosis present

## 2014-03-23 DIAGNOSIS — Z7982 Long term (current) use of aspirin: Secondary | ICD-10-CM | POA: Diagnosis not present

## 2014-03-23 DIAGNOSIS — J90 Pleural effusion, not elsewhere classified: Secondary | ICD-10-CM | POA: Diagnosis not present

## 2014-03-23 DIAGNOSIS — Z79899 Other long term (current) drug therapy: Secondary | ICD-10-CM | POA: Diagnosis not present

## 2014-03-23 DIAGNOSIS — K5289 Other specified noninfective gastroenteritis and colitis: Secondary | ICD-10-CM | POA: Diagnosis present

## 2014-03-23 DIAGNOSIS — T68XXXA Hypothermia, initial encounter: Secondary | ICD-10-CM | POA: Diagnosis present

## 2014-03-23 DIAGNOSIS — J811 Chronic pulmonary edema: Secondary | ICD-10-CM | POA: Diagnosis not present

## 2014-03-23 DIAGNOSIS — K859 Acute pancreatitis without necrosis or infection, unspecified: Secondary | ICD-10-CM | POA: Diagnosis not present

## 2014-03-23 DIAGNOSIS — Z21 Asymptomatic human immunodeficiency virus [HIV] infection status: Secondary | ICD-10-CM | POA: Diagnosis not present

## 2014-03-23 HISTORY — DX: Hyperkalemia: E87.5

## 2014-03-23 HISTORY — DX: Hypo-osmolality and hyponatremia: E87.1

## 2014-03-23 HISTORY — DX: Unspecified severe protein-calorie malnutrition: E43

## 2014-03-23 LAB — GLUCOSE, CAPILLARY
GLUCOSE-CAPILLARY: 126 mg/dL — AB (ref 70–99)
GLUCOSE-CAPILLARY: 81 mg/dL (ref 70–99)
Glucose-Capillary: 126 mg/dL — ABNORMAL HIGH (ref 70–99)
Glucose-Capillary: 79 mg/dL (ref 70–99)
Glucose-Capillary: 79 mg/dL (ref 70–99)
Glucose-Capillary: 95 mg/dL (ref 70–99)

## 2014-03-23 LAB — BASIC METABOLIC PANEL
ANION GAP: 28 — AB (ref 5–15)
Anion gap: 27 — ABNORMAL HIGH (ref 5–15)
Anion gap: 26 — ABNORMAL HIGH (ref 5–15)
Anion gap: 25 — ABNORMAL HIGH (ref 5–15)
BUN: 65 mg/dL — AB (ref 6–23)
BUN: 62 mg/dL — ABNORMAL HIGH (ref 6–23)
BUN: 63 mg/dL — ABNORMAL HIGH (ref 6–23)
BUN: 67 mg/dL — ABNORMAL HIGH (ref 6–23)
CALCIUM: 6.9 mg/dL — AB (ref 8.4–10.5)
CHLORIDE: 97 meq/L (ref 96–112)
CO2: 7 mEq/L — CL (ref 19–32)
CO2: 7 mEq/L — CL (ref 19–32)
CO2: 9 meq/L — AB (ref 19–32)
CO2: 11 mEq/L — ABNORMAL LOW (ref 19–32)
Calcium: 7.6 mg/dL — ABNORMAL LOW (ref 8.4–10.5)
Calcium: 6.5 mg/dL — ABNORMAL LOW (ref 8.4–10.5)
Calcium: 6.3 mg/dL — CL (ref 8.4–10.5)
Chloride: 94 mEq/L — ABNORMAL LOW (ref 96–112)
Chloride: 99 mEq/L (ref 96–112)
Chloride: 99 mEq/L (ref 96–112)
Creatinine, Ser: 6.8 mg/dL — ABNORMAL HIGH (ref 0.50–1.35)
Creatinine, Ser: 6.72 mg/dL — ABNORMAL HIGH (ref 0.50–1.35)
Creatinine, Ser: 6.81 mg/dL — ABNORMAL HIGH (ref 0.50–1.35)
Creatinine, Ser: 7.06 mg/dL — ABNORMAL HIGH (ref 0.50–1.35)
GFR calc Af Amer: 9 mL/min — ABNORMAL LOW (ref >90)
GFR calc Af Amer: 9 mL/min — ABNORMAL LOW (ref >90)
GFR calc Af Amer: 8 mL/min — ABNORMAL LOW (ref >90)
GFR calc non Af Amer: 8 mL/min — ABNORMAL LOW (ref >90)
GFR calc non Af Amer: 8 mL/min — ABNORMAL LOW (ref >90)
GFR calc non Af Amer: 7 mL/min — ABNORMAL LOW (ref >90)
GFR, EST AFRICAN AMERICAN: 9 mL/min — AB (ref >90)
GFR, EST NON AFRICAN AMERICAN: 8 mL/min — AB (ref >90)
GLUCOSE: 80 mg/dL (ref 70–99)
GLUCOSE: 78 mg/dL (ref 70–99)
GLUCOSE: 82 mg/dL (ref 70–99)
Glucose, Bld: 134 mg/dL — ABNORMAL HIGH (ref 70–99)
POTASSIUM: 4.5 meq/L (ref 3.7–5.3)
POTASSIUM: 4.3 meq/L (ref 3.7–5.3)
Potassium: 5.5 mEq/L — ABNORMAL HIGH (ref 3.7–5.3)
Potassium: 4.6 mEq/L (ref 3.7–5.3)
SODIUM: 128 meq/L — AB (ref 137–147)
SODIUM: 134 meq/L — AB (ref 137–147)
SODIUM: 134 meq/L — AB (ref 137–147)
Sodium: 133 mEq/L — ABNORMAL LOW (ref 137–147)

## 2014-03-23 LAB — URINALYSIS, ROUTINE W REFLEX MICROSCOPIC
BILIRUBIN URINE: NEGATIVE
GLUCOSE, UA: 250 mg/dL — AB
KETONES UR: NEGATIVE mg/dL
Leukocytes, UA: NEGATIVE
Nitrite: NEGATIVE
PH: 5.5 (ref 5.0–8.0)
Protein, ur: 100 mg/dL — AB
Specific Gravity, Urine: 1.013 (ref 1.005–1.030)
Urobilinogen, UA: 0.2 mg/dL (ref 0.0–1.0)

## 2014-03-23 LAB — CBC
HCT: 35.6 % — ABNORMAL LOW (ref 39.0–52.0)
HEMATOCRIT: 36.2 % — AB (ref 39.0–52.0)
HEMOGLOBIN: 13 g/dL (ref 13.0–17.0)
HEMOGLOBIN: 12.4 g/dL — AB (ref 13.0–17.0)
MCH: 33.9 pg (ref 26.0–34.0)
MCH: 34.1 pg — ABNORMAL HIGH (ref 26.0–34.0)
MCHC: 35.9 g/dL (ref 30.0–36.0)
MCHC: 34.8 g/dL (ref 30.0–36.0)
MCV: 94.3 fL (ref 78.0–100.0)
MCV: 97.8 fL (ref 78.0–100.0)
PLATELETS: 133 10*3/uL — AB (ref 150–400)
Platelets: 155 10*3/uL (ref 150–400)
RBC: 3.84 MIL/uL — AB (ref 4.22–5.81)
RBC: 3.64 MIL/uL — AB (ref 4.22–5.81)
RDW: 13.6 % (ref 11.5–15.5)
RDW: 13.3 % (ref 11.5–15.5)
WBC: 10.5 10*3/uL (ref 4.0–10.5)
WBC: 15.1 10*3/uL — AB (ref 4.0–10.5)

## 2014-03-23 LAB — I-STAT VENOUS BLOOD GAS, ED
ACID-BASE DEFICIT: 23 mmol/L — AB (ref 0.0–2.0)
Bicarbonate: 6.1 mEq/L — ABNORMAL LOW (ref 20.0–24.0)
O2 Saturation: 78 %
TCO2: 7 mmol/L (ref 0–100)
pCO2, Ven: 21.2 mmHg — ABNORMAL LOW (ref 45.0–50.0)
pH, Ven: 7.059 — CL (ref 7.250–7.300)
pO2, Ven: 56 mmHg — ABNORMAL HIGH (ref 30.0–45.0)

## 2014-03-23 LAB — CBG MONITORING, ED: Glucose-Capillary: 144 mg/dL — ABNORMAL HIGH (ref 70–99)

## 2014-03-23 LAB — CALCIUM, IONIZED: CALCIUM ION: 0.86 mmol/L — AB (ref 1.13–1.30)

## 2014-03-23 LAB — URINE MICROSCOPIC-ADD ON

## 2014-03-23 LAB — EPSTEIN-BARR VIRUS VCA ANTIBODY PANEL: EBV NA IGG: 377 U/mL — AB (ref <18.0)

## 2014-03-23 LAB — SODIUM, URINE, RANDOM
SODIUM UR: 62 meq/L
Sodium, Ur: 61 mEq/L

## 2014-03-23 LAB — PROTIME-INR
INR: 1.56 — ABNORMAL HIGH (ref 0.00–1.49)
PROTHROMBIN TIME: 18.7 s — AB (ref 11.6–15.2)

## 2014-03-23 LAB — POCT I-STAT 3, ART BLOOD GAS (G3+)
ACID-BASE DEFICIT: 19 mmol/L — AB (ref 0.0–2.0)
Bicarbonate: 7.5 mEq/L — ABNORMAL LOW (ref 20.0–24.0)
O2 Saturation: 69 %
TCO2: 8 mmol/L (ref 0–100)
pCO2 arterial: 19.7 mmHg — CL (ref 35.0–45.0)
pH, Arterial: 7.183 — CL (ref 7.350–7.450)
pO2, Arterial: 40 mmHg — ABNORMAL LOW (ref 80.0–100.0)

## 2014-03-23 LAB — CYTOMEGALOVIRUS ANTIBODY, IGG: Cytomegalovirus Ab-IgG: >10 U/mL — ABNORMAL HIGH (ref <0.60)

## 2014-03-23 LAB — BLOOD GAS, ARTERIAL
Acid-base deficit: 19.6 mmol/L — ABNORMAL HIGH (ref 0.0–2.0)
Bicarbonate: 6.9 mEq/L — ABNORMAL LOW (ref 20.0–24.0)
DRAWN BY: 249101
O2 CONTENT: 2 L/min
O2 Saturation: 93 %
PO2 ART: 74.9 mmHg — AB (ref 80.0–100.0)
Patient temperature: 98.6
TCO2: 7.5 mmol/L (ref 0–100)
pCO2 arterial: 17.8 mmHg — CL (ref 35.0–45.0)
pH, Arterial: 7.215 — ABNORMAL LOW (ref 7.350–7.450)

## 2014-03-23 LAB — TROPONIN I
Troponin I: <0.3 ng/mL (ref <0.30)
Troponin I: <0.3 ng/mL (ref <0.30)

## 2014-03-23 LAB — MAGNESIUM: MAGNESIUM: 2.1 mg/dL (ref 1.5–2.5)

## 2014-03-23 LAB — CREATININE, URINE, RANDOM: Creatinine, Urine: 33.68 mg/dL

## 2014-03-23 LAB — OSMOLALITY: OSMOLALITY: 292 mosm/kg (ref 275–300)

## 2014-03-23 LAB — LACTIC ACID, PLASMA
LACTIC ACID, VENOUS: 5.8 mmol/L — AB (ref 0.5–2.2)
Lactic Acid, Venous: 5.1 mmol/L — ABNORMAL HIGH (ref 0.5–2.2)
Lactic Acid, Venous: 3.8 mmol/L — ABNORMAL HIGH (ref 0.5–2.2)

## 2014-03-23 LAB — CMV IGM: CMV IgM: <8 AU/mL (ref <30.00)

## 2014-03-23 LAB — CORTISOL: Cortisol, Plasma: 63.7 ug/dL

## 2014-03-23 LAB — LACTATE DEHYDROGENASE: LDH: 393 U/L — AB (ref 94–250)

## 2014-03-23 LAB — MRSA PCR SCREENING: MRSA by PCR: NEGATIVE

## 2014-03-23 LAB — PHOSPHORUS: Phosphorus: 8.8 mg/dL — ABNORMAL HIGH (ref 2.3–4.6)

## 2014-03-23 LAB — I-STAT CG4 LACTIC ACID, ED: LACTIC ACID, VENOUS: 6.3 mmol/L — AB (ref 0.5–2.2)

## 2014-03-23 LAB — OSMOLALITY, URINE: Osmolality, Ur: 289 mOsm/kg — ABNORMAL LOW (ref 390–1090)

## 2014-03-23 LAB — LIPASE, BLOOD: Lipase: 2024 U/L — ABNORMAL HIGH (ref 11–59)

## 2014-03-23 LAB — AMYLASE: Amylase: 1822 U/L — ABNORMAL HIGH (ref 0–105)

## 2014-03-23 LAB — CK: Total CK: 345 U/L — ABNORMAL HIGH (ref 7–232)

## 2014-03-23 LAB — TRIGLYCERIDES: TRIGLYCERIDES: 152 mg/dL — AB (ref <150)

## 2014-03-23 MED ORDER — INSULIN ASPART 100 UNIT/ML ~~LOC~~ SOLN
2.0000 [IU] | SUBCUTANEOUS | Status: DC
  Administered 2014-03-23 – 2014-03-25 (×5): 2 [IU] via SUBCUTANEOUS
  Administered 2014-03-25: 4 [IU] via SUBCUTANEOUS
  Administered 2014-03-26: 2 [IU] via SUBCUTANEOUS
  Administered 2014-03-26: 6 [IU] via SUBCUTANEOUS

## 2014-03-23 MED ORDER — VANCOMYCIN HCL IN DEXTROSE 750-5 MG/150ML-% IV SOLN: 750.0000 mg | INTRAVENOUS | Status: DC

## 2014-03-23 MED ORDER — VANCOMYCIN HCL IN DEXTROSE 1-5 GM/200ML-% IV SOLN
1000.0000 mg | Freq: Once | INTRAVENOUS | Status: AC
  Administered 2014-03-23: 1000 mg via INTRAVENOUS
  Filled 2014-03-23: qty 200

## 2014-03-23 MED ORDER — ONDANSETRON HCL 4 MG/2ML IJ SOLN
4.0000 mg | Freq: Four times a day (QID) | INTRAMUSCULAR | Status: DC | PRN
  Administered 2014-03-23 – 2014-04-06 (×8): 4 mg via INTRAVENOUS
  Filled 2014-03-23 (×8): qty 2

## 2014-03-23 MED ORDER — SODIUM CHLORIDE 0.9 % IV BOLUS (SEPSIS)
2000.0000 mL | Freq: Once | INTRAVENOUS | Status: AC
  Administered 2014-03-23 (×2): 1000 mL via INTRAVENOUS

## 2014-03-23 MED ORDER — SODIUM CHLORIDE 0.9 % IV BOLUS (SEPSIS)
500.0000 mL | Freq: Once | INTRAVENOUS | Status: AC
  Administered 2014-03-23: 14:00:00 via INTRAVENOUS

## 2014-03-23 MED ORDER — SODIUM CHLORIDE 0.9 % IV BOLUS (SEPSIS)
1000.0000 mL | Freq: Once | INTRAVENOUS | Status: AC
  Administered 2014-03-23: 1000 mL via INTRAVENOUS

## 2014-03-23 MED ORDER — CHLORHEXIDINE GLUCONATE 0.12 % MT SOLN
15.0000 mL | Freq: Two times a day (BID) | OROMUCOSAL | Status: DC
  Administered 2014-03-23 – 2014-04-09 (×28): 15 mL via OROMUCOSAL
  Filled 2014-03-23 (×39): qty 15

## 2014-03-23 MED ORDER — SODIUM CHLORIDE 0.9 % IV SOLN
INTRAVENOUS | Status: DC
  Administered 2014-03-23: 06:00:00 via INTRAVENOUS

## 2014-03-23 MED ORDER — SODIUM BICARBONATE 8.4 % IV SOLN
INTRAVENOUS | Status: AC
  Administered 2014-03-23: 150 meq
  Filled 2014-03-23: qty 150

## 2014-03-23 MED ORDER — SODIUM BICARBONATE 8.4 % IV SOLN
INTRAVENOUS | Status: DC
  Administered 2014-03-23 – 2014-03-24 (×3): via INTRAVENOUS
  Filled 2014-03-23 (×6): qty 150

## 2014-03-23 MED ORDER — HYDROMORPHONE HCL PF 1 MG/ML IJ SOLN
0.5000 mg | Freq: Once | INTRAMUSCULAR | Status: AC
  Administered 2014-03-23: 0.5 mg via INTRAVENOUS
  Filled 2014-03-23: qty 1

## 2014-03-23 MED ORDER — HEPARIN SODIUM (PORCINE) 5000 UNIT/ML IJ SOLN
5000.0000 [IU] | Freq: Three times a day (TID) | INTRAMUSCULAR | Status: DC
  Administered 2014-03-23 – 2014-03-24 (×4): 5000 [IU] via SUBCUTANEOUS
  Filled 2014-03-23 (×7): qty 1

## 2014-03-23 MED ORDER — SODIUM CHLORIDE 0.9 % IV SOLN: INTRAVENOUS | Status: DC

## 2014-03-23 MED ORDER — PANTOPRAZOLE SODIUM 40 MG IV SOLR
40.0000 mg | Freq: Every day | INTRAVENOUS | Status: DC
  Administered 2014-03-23 – 2014-03-31 (×8): 40 mg via INTRAVENOUS
  Filled 2014-03-23 (×9): qty 40

## 2014-03-23 MED ORDER — DEXTROSE 5 % IV SOLN
INTRAVENOUS | Status: DC
  Administered 2014-03-23: via INTRAVENOUS

## 2014-03-23 MED ORDER — SODIUM CHLORIDE 0.9 % IV SOLN: 250.0000 mL | INTRAVENOUS | Status: DC | PRN

## 2014-03-23 MED ORDER — ONDANSETRON HCL 4 MG/2ML IJ SOLN
INTRAMUSCULAR | Status: AC
  Filled 2014-03-23: qty 2

## 2014-03-23 MED ORDER — SODIUM CHLORIDE 0.9 % IV SOLN
250.0000 mg | Freq: Two times a day (BID) | INTRAVENOUS | Status: DC
  Administered 2014-03-23 – 2014-03-24 (×3): 250 mg via INTRAVENOUS
  Filled 2014-03-23 (×4): qty 250

## 2014-03-23 MED ORDER — CETYLPYRIDINIUM CHLORIDE 0.05 % MT LIQD
7.0000 mL | Freq: Two times a day (BID) | OROMUCOSAL | Status: DC
  Administered 2014-03-23 – 2014-04-09 (×29): 7 mL via OROMUCOSAL

## 2014-03-23 NOTE — Consult Note (Signed)
[name] is an 68 y.o. male referred by Dr Titus Mould   Chief Complaint: ARF, met acidosis HPI: 67yo BM with HIV admitted 03/22/14 for 3wks N/V/D that significantly worsened over last 3 days. PO intake has been poor and he was on lisinopril.  No hx renal disease though has had HTN x 49yr and DM x 3-89yr  Labs suggest pancreatitis with elevated amylase and lipase.  Bicarb low and has been on metformin.  Scr 02/20/14 was .87 and on admission was 7.12 and today 6.8.  UO starting to improve.  CT wo contrast shows unremarkable kidneys.  Past Medical History  Diagnosis Date  . Arthritis   . Diabetes mellitus   . History of depression   . GERD (gastroesophageal reflux disease)   . CAD (coronary artery disease)     2003- cabg  . Hypertension   . Hyperlipidemia   . History of kidney stones   . COPD (chronic obstructive pulmonary disease)   . HIV (human immunodeficiency virus infection)   . Dermatitis 11/27/2012  . Nocturia 03/06/2013  . Epicondylitis 06/05/2013    right    Past Surgical History  Procedure Laterality Date  . Coronary artery bypass graft  05/2002  . Vasectomy      Family History  Problem Relation Age of Onset  . Arthritis      mother/father/paternal grandparents  . Breast cancer Maternal Aunt     paternal aunt  . Lung cancer Maternal Aunt   . Hyperlipidemia Mother   . Hyperlipidemia Father   . Heart disease      parents/maternal grandparents/ 2 brothers  . Stroke Paternal Grandmother   . Hypertension Father     paternal grandmother/3 brothers/1 sister  . Mental retardation Sister   . Diabetes Mother     paternal grandparents/1 brother  FH neg for renal disease  Social History:  reports that he has never smoked. He has never used smokeless tobacco. He reports that he does not drink alcohol or use illicit drugs. Lives by himself  Allergies: No Known Allergies  Medications Prior to Admission  Medication Sig Dispense Refill  . amLODipine (NORVASC) 2.5 MG tablet  Take 2.5 mg by mouth daily.      . Marland Kitchenspirin 81 MG chewable tablet Chew 81 mg by mouth daily.      . Marland Kitchentenolol (TENORMIN) 50 MG tablet Take 50 mg by mouth daily.      . Marland Kitchentorvastatin (LIPITOR) 10 MG tablet Take 10 mg by mouth daily.      . Marland Kitchen complex vitamins tablet Take 1 tablet by mouth daily.      . Marland Kitchenlvitegravir-cobicistat-emtricitabine-tenofovir (STRIBILD) 150-150-200-300 MG TABS tablet Take 1 tablet by mouth daily with breakfast.      . fenofibrate 160 MG tablet Take 160 mg by mouth daily.      . Marland KitchenLUCERNA (GLUCERNA) LIQD Take 1 Can by mouth 2 (two) times daily between meals.  48 Can  5  . HYDROcodone-acetaminophen (NORCO/VICODIN) 5-325 MG per tablet Take 1-2 tablets by mouth every 6 (six) hours as needed for moderate pain. Take one tablet when pain starts then may take another one if pain continues      . KRILL OIL OMEGA-3 PO Take 1 capsule by mouth daily.      . Marland Kitchenisinopril-hydrochlorothiazide (PRINZIDE,ZESTORETIC) 20-12.5 MG per tablet Take 1 tablet by mouth 2 (two) times daily. Take twice daily per patient      . metFORMIN (GLUCOPHAGE) 1000 MG tablet Take 1,000 mg by mouth  2 (two) times daily with a meal.      . Misc Natural Products (FIBER SUPREME PO) Take 10 mLs by mouth daily.      Marland Kitchen omeprazole (PRILOSEC) 20 MG capsule Take 20 mg by mouth daily.      . ondansetron (ZOFRAN-ODT) 8 MG disintegrating tablet Take 8 mg by mouth every 8 (eight) hours as needed for nausea or vomiting.      . Pancrelipase, Lip-Prot-Amyl, 24000 UNITS CPEP Take 1 capsule by mouth 3 (three) times daily.      . Probiotic Product (PROBIOTIC DAILY PO) Take 1 tablet by mouth daily.       . valACYclovir (VALTREX) 500 MG tablet Take 500 mg by mouth 2 (two) times daily.      . [DISCONTINUED] Pancrelipase, Lip-Prot-Amyl, 24000 UNITS CPEP Take 1 capsule (24,000 Units total) by mouth 3 (three) times daily.  36 capsule  0  . Lancets (FREESTYLE) lancets USE TO CHECK BLOOD SUGAR EVERY DAY  100 each  0     Lab Results: UA: 100g  protein, 0 wbc 0-2 rbc   Recent Labs  03/22/14 1241 03/22/14 2111 03/23/14 0300  WBC 10.5 15.8* 15.1*  HGB 13.0 12.8* 12.4*  HCT 36.2* 35.7* 35.6*  PLT 155 164 133*   BMET  Recent Labs  03/23/14 0300 03/23/14 0845 03/23/14 1020  NA 128* 134* 134*  K 5.5* 4.6 4.5  CL 94* 99 99  CO2 7* 7* 9*  GLUCOSE 134* 80 78  BUN 65* 62* 63*  CREATININE 6.80* 6.72* 6.81*  CALCIUM 7.6* 6.9* 6.5*  PHOS 8.8*  --   --    LFT  Recent Labs  03/22/14 2230  PROT 6.7  ALBUMIN 3.3*  AST 50*  ALT 32  ALKPHOS 50  BILITOT 0.7   Ct Abdomen Pelvis Wo Contrast  03/22/2014   CLINICAL DATA:  Nausea, vomiting and diarrhea.  EXAM: CT ABDOMEN AND PELVIS WITHOUT CONTRAST  TECHNIQUE: Multidetector CT imaging of the abdomen and pelvis was performed following the standard protocol without IV contrast.  COMPARISON:  11/15/2013  FINDINGS: The lung bases are grossly clear.  The liver is grossly normal and stable. Small calcified granulomas are noted. The gallbladder is grossly normal. No common bile duct dilatation. The pancreas is grossly normal. The spleen is normal in size. No focal lesions. The adrenal glands and kidneys are unremarkable without contrast. No hydronephrosis or obstructing ureteral calculi.  The stomach is moderately distended and there is interstitial changes around the stomach. Could not exclude partial gastric outlet obstruction. Mild wall thickening involving the duodenum but no obvious ulceration. No dilatation of the small bowel or colon. There is mesenteric edema and a small amount of abdominal and pelvic fluid which was also present on the prior study.  No mesenteric or retroperitoneal mass or adenopathy. The aorta is normal in caliber.  The bladder, prostate gland and seminal vesicles are unremarkable. No pelvic mass or adenopathy. There is a small amount of free pelvic fluid. No inguinal mass or adenopathy. Mild diffuse body wall edema.  IMPRESSION: 1. Distended stomach with surrounding  interstitial change in fluid. Could not exclude a partial gastric outlet obstruction. 2. Mesenteric edema and scattered abdominal and pelvic fluid appears relatively stable when compared to prior CT scan. 3. Body wall edema.   Electronically Signed   By: Kalman Jewels M.D.   On: 03/22/2014 23:32   Dg Chest Portable 1 View  03/23/2014   CLINICAL DATA:  Left IJ catheter placement.  EXAM: PORTABLE CHEST - 1 VIEW  COMPARISON:  PA and lateral chest 10/18/2012.  FINDINGS: A new left IJ catheter is in place with the tip in the right atrium. Recommend withdrawal of 5.5 cm. Mild interstitial edema is present. There is no pneumothorax. Heart size is upper normal.  IMPRESSION: Tip of left IJ catheter is in the right atrium. Recommend withdrawal of 5.5 cm. Negative for pneumothorax.  Interstitial edema.  These results will be called to the ordering clinician or representative by the Radiologist Assistant, and communication documented in the PACS or zVision Dashboard.   Electronically Signed   By: Inge Rise M.D.   On: 03/23/2014 13:08    ROS: No change in vision No SOB No CP + ABd pain No dysuria No new arthritic CO No new neuropathic Sxs  PHYSICAL EXAM: Blood pressure 121/65, pulse 77, temperature 97.4 F (36.3 C), temperature source Oral, resp. rate 21, height '5\' 3"'  (1.6 m), weight 54.4 kg (119 lb 14.9 oz), SpO2 95.00%. HEENT: PERRLA  EOMI NECK:No JVD  Lt IJ triple lumen LUNGS:clear CARDIAC:RRR 2/6 systolic M ABD:+ BS, mild diffuse tenderness, + guarding and mild rebound EXT:No CCE NEURO:CNI Ox3 No asterixis  Assessment: 1. ARF most likely secondary to volume depletion in face of ACE I 2. Metabolic Acidosis sec ARF, diarrhea +/- metformin 3. Pancreatitis 4. HIV 5. HTN 6. DM 7. hypocalcemia PLAN: 1. Agree with IV bicarb and hydration 2. Hold ACE and metformin 3. Discussed role of HD if renal fx does not improve in next few days.  I am optimistic that renal fx will eventually  recover 4. Daily Scr 5. Daily I/O's   Zykeriah Mathia T 03/23/2014, 3:14 PM

## 2014-03-23 NOTE — Progress Notes (Signed)
CRITICAL VALUE ALERT  Critical value received: ABG ph 7.215, co2 17.8, po2 74.9 bicarb 6.9  Date of notification: 03/23/17  Time of notification:  1205  Critical value read back: yes  Nurse who received alert:  LBass  MD notified (1st page):  Dr Titus Mould  Time of first page:  1205  MD notified (2nd page):  Time of second page:  Responding MD: feinstein  Time MD responded:  1205

## 2014-03-23 NOTE — H&P (Signed)
PULMONARY / CRITICAL CARE MEDICINE   Name: NATHANUEL CABREJA MRN: [id number] DOB: [birth year]    ADMISSION DATE:  03/22/2014 CONSULTATION DATE:  03/23/2014  REFERRING MD :  Keachi:  Emesis  INITIAL PRESENTATION: 68 year old male with HIV on STRIBILD presented to [place] c/o emesis, diarrhea, and generalized weakness x 3 days . In ED found to be in renal failure with elevated lipase. Transferred to [facility] for ICU admission and PCCM eval.    STUDIES:  8/19 CT abd/pelvis - Distended stomach with interstitial fluid changes, cannot exclude gastric outlet obstruction. Mesenteric edema. Abdominal/pelvic fluid stable from prior exam. Body wall edema. No hydronephrosis  SIGNIFICANT EVENTS: 8/19 admitted to ICU  SUBJECTIVE: acidosis continues, stool increased  VITAL SIGNS: Temp:  [94.9 F (34.9 C)-97.4 F (36.3 C)] 94.9 F (34.9 C) (08/20 0920) Pulse Rate:  [65-81] 77 (08/20 0900) Resp:  [14-26] 18 (08/20 0900) BP: (106-141)/(56-84) 125/64 mmHg (08/20 0900) SpO2:  [92 %-100 %] 95 % (08/20 0900) Weight:  [51.37 kg (113 lb 4 oz)-54.4 kg (119 lb 14.9 oz)] 54.4 kg (119 lb 14.9 oz) (08/20 0243) HEMODYNAMICS:   VENTILATOR SETTINGS:   INTAKE / OUTPUT:  Intake/Output Summary (Last 24 hours) at 03/23/14 1006 Last data filed at 03/23/14 0800  Gross per 24 hour  Intake 2907.83 ml  Output    432 ml  Net 2475.83 ml    PHYSICAL EXAMINATION: General:  Thin male, drowsy, in NAD Neuro:  Alert, oriented, more awake HEENT:  bitemporal wasting, PERRL. Cardiovascular:  RRR, no MRG Lungs: slight increase RR Abdomen:  Distended, generalized tenderness.  Musculoskeletal:  No acute deformity or ROM limitation. No peripheral edema.  Skin:  Intact.   LABS:  CBC  Recent Labs Lab 03/22/14 1241 03/22/14 2111 03/23/14 0300  WBC 10.5 15.8* 15.1*  HGB 13.0 12.8* 12.4*  HCT 36.2* 35.7* 35.6*  PLT 155 164 133*   Coag's  Recent Labs Lab 03/23/14 0300  INR 1.56*    BMET  Recent Labs Lab 03/22/14 2230 03/23/14 0300 03/23/14 0845  NA 127* 128* 134*  K 6.4* 5.5* 4.6  CL 91* 94* 99  CO2 7* 7* 7*  BUN 69* 65* 62*  CREATININE 7.10* 6.80* 6.72*  GLUCOSE 62* 134* 80   Electrolytes  Recent Labs Lab 03/22/14 2230 03/23/14 0300 03/23/14 0845  CALCIUM 7.8* 7.6* 6.9*  MG  --  2.1  --   PHOS  --  8.8*  --    Sepsis Markers  Recent Labs Lab 03/23/14 0028 03/23/14 0300  LATICACIDVEN 6.30* 5.8*   ABG  Recent Labs Lab 03/23/14 0352  PHART 7.183*  PCO2ART 19.7*  PO2ART 40.0*   Liver Enzymes  Recent Labs Lab 03/22/14 1241 03/22/14 2111 03/22/14 2230  AST 29 35 50*  ALT 20 23 32  ALKPHOS 51 56 50  BILITOT 0.9 0.6 0.7  ALBUMIN 4.1 3.7 3.3*   Cardiac Enzymes  Recent Labs Lab 03/23/14 0300  TROPONINI <0.30   Glucose  Recent Labs Lab 03/23/14 0054 03/23/14 0201 03/23/14 0355 03/23/14 0805  GLUCAP 144* 126* 126* 79    Imaging Ct Abdomen Pelvis Wo Contrast  03/22/2014   CLINICAL DATA:  Nausea, vomiting and diarrhea.  EXAM: CT ABDOMEN AND PELVIS WITHOUT CONTRAST  TECHNIQUE: Multidetector CT imaging of the abdomen and pelvis was performed following the standard protocol without IV contrast.  COMPARISON:  11/15/2013  FINDINGS: The lung bases are grossly clear.  The liver is grossly normal  and stable. Small calcified granulomas are noted. The gallbladder is grossly normal. No common bile duct dilatation. The pancreas is grossly normal. The spleen is normal in size. No focal lesions. The adrenal glands and kidneys are unremarkable without contrast. No hydronephrosis or obstructing ureteral calculi.  The stomach is moderately distended and there is interstitial changes around the stomach. Could not exclude partial gastric outlet obstruction. Mild wall thickening involving the duodenum but no obvious ulceration. No dilatation of the small bowel or colon. There is mesenteric edema and a small amount of abdominal and pelvic fluid  which was also present on the prior study.  No mesenteric or retroperitoneal mass or adenopathy. The aorta is normal in caliber.  The bladder, prostate gland and seminal vesicles are unremarkable. No pelvic mass or adenopathy. There is a small amount of free pelvic fluid. No inguinal mass or adenopathy. Mild diffuse body wall edema.  IMPRESSION: 1. Distended stomach with surrounding interstitial change in fluid. Could not exclude a partial gastric outlet obstruction. 2. Mesenteric edema and scattered abdominal and pelvic fluid appears relatively stable when compared to prior CT scan. 3. Body wall edema.   Electronically Signed   By: Kalman Jewels M.D.   On: 03/22/2014 23:32     ASSESSMENT / PLAN:  PULMONARY A: Acute on chronic respiratory failure COPD without evidence of exacerbation. Uncompensated met acidosis   P:   Supplemental O2 as needed to maintain SpO2 greater than 92% BD's PRN F/u CXR/ABG further Follow lactic acid  CARDIOVASCULAR A:  H/o HTN, DAS s/p remote CABG, hypovolemia  P:  Hold preadmission PO medications (amlodipine, atenolol, statin, fibrate, lisinopril/hctz) Repeat lactic acid Pos balance, keep up with losses If neg or even bolus and increase rate Place line Push volume  RENAL A:   Acute renal failure Hyperkalemia - treated in ED Hyponatremia (likley hypotonic, hypovolemic) High AG metabolic acidosis (suspect lactic, uremic)  P:   bmet q6h As concern for diarrhea developing a non ag process , will start bicarb for those potential losses , have no room for worsening acdosis Insert foley Check FeNa, will calculate Strict I&Omay need nephro involvement Ct no hydro bicarb start drip , follow K , at risk non ag worsening sedn urine osm, na Serum osm  GASTROINTESTINAL A:   Severe acute pancreatitis, possibly 2nd to STRIBILD (hx of this with other ARV meds)  P:   NPO Aggressive IVF resuscitation as tolerated SUP: IV PPI PRN zofran Place  line Assess amy, ldh abdo [place] to correlate with CT lipase trend  HEMATOLOGIC A:   Anemia Leukocytosis  P:  Follow CBC in am  Sub q hep  INFECTIOUS A:   SIRS/Sepsis  HIV hypothermic  P:   Hold ARV medications Consult ID 8/20 AM, followed by Dr. Megan Salon as outpt.  BCx2 8/20 >>> UC 8/20 >>> Send stool work up, [employer] acdi fast Add Imipenem 8/20>>> Add vanc 8/20>>> [place] abdo  ENDOCRINE A:   DM  P:   CBG monitoring and SSI Hold etformin  NEUROLOGIC A:   Acute encephalopathy in setting of acidosis - 8/20 drowsy, easily arousable  P:   RASS goal: 0 abg repeat  Global: place line, increase volume, add bicarb drip, add abx, ID consult, frequent chemistry  CC time 35 minutes.  Lavon Paganini. Titus Mould, MD, [place] Pgr: [facility]

## 2014-03-23 NOTE — Progress Notes (Signed)
CRITICAL VALUE ALERT  Critical value received:  CO2 = 9  Date of notification:  03/23/2014  Time of notification:  1600  Critical value read back:Yes.    Nurse who received alert:  Ricki Rodriguez  MD notified (1st page):  Dr. Lamonte Sakai  Time of first page:  1600  MD notified (2nd page):  Time of second page:  Responding MD:  Dr. Danne Harbor  Time MD responded:  1600

## 2014-03-23 NOTE — Procedures (Signed)
Central Venous Catheter Insertion Procedure Note [name] [id number] 07-13-1946  Procedure: Insertion of Central Venous Catheter Indications: Assessment of intravascular volume and Drug and/or fluid administration  Procedure Details Consent: Risks of procedure as well as the alternatives and risks of each were explained to the (patient/caregiver).  Consent for procedure obtained. Time Out: Verified patient identification, verified procedure, site/side was marked, verified correct patient position, special equipment/implants available, medications/allergies/relevent history reviewed, required imaging and test results available.  Performed  Maximum sterile technique was used including antiseptics, cap, gloves, gown, hand hygiene, mask and sheet. Skin prep: Chlorhexidine; local anesthetic administered A antimicrobial bonded/coated triple lumen catheter was placed in the left internal jugular vein using the Seldinger technique.  Evaluation Blood flow good Complications: No apparent complications Patient did tolerate procedure well. Chest X-ray ordered to verify placement.  CXR: pending.  Performed under direct MD supervision.  Performed using ultrasound guidance.  Wire visualized in vessel under ultrasound.    [place] tolerated  Lavon Paganini. Titus Mould, MD, [place] Pgr: [facility]

## 2014-03-23 NOTE — Consult Note (Signed)
[facility] for Infectious Disease     Reason for Consult: pancreatitis and HIV    Referring Physician: Dr. Titus Mould  Active Problems:   AKI (acute kidney injury)   Metabolic acidosis   Hyperkalemia   Hyponatremia   HIV disease   Acute gastroenteritis   Protein-calorie malnutrition, severe   Acute kidney injury   . antiseptic oral rinse  7 mL Mouth Rinse q12n4p  . chlorhexidine  15 mL Mouth Rinse BID  . heparin  5,000 Units Subcutaneous 3 times per day  . imipenem-cilastatin  250 mg Intravenous Q12H  . insulin aspart  2-6 Units Subcutaneous 6 times per day  . pantoprazole (PROTONIX) IV  40 mg Intravenous QHS  . sodium chloride  500 mL Intravenous Once  . vancomycin  1,000 mg Intravenous Once  . [START ON 03/25/2014] vancomycin  750 mg Intravenous Q48H    Recommendations: Hold Stribild I will consider a non-NRTI based regimen Would MRI be of benefit? (rule out pancreatic cancer, other etiologies)   Assessment: He has significant lactic acidosis, increased lipase, renal failure.   This is his second episode of pancreatitis and no obvious cause identified.  Had ERCP last visit.   Could be drug effect with NRTIs, idiopathic.  Other drugs?  Antibiotics: Vancomycin and imipenem  HPI: [name] is a 68 y.o. male with HIV previously on viramune with combivir but developed his first episode of pancreatitis earlier this year with no speicific etiology identified but concern for medication effect. Lipids were not significantly elevated, denies significant alcohol, nothing noted on ERCP, no gallstones.  He comes in again with high lipase, ARF, lacticacidosis.     Review of Systems: A comprehensive review of systems was negative.  Past Medical History  Diagnosis Date  . Arthritis   . Diabetes mellitus   . History of depression   . GERD (gastroesophageal reflux disease)   . CAD (coronary artery disease)     2003- cabg  . Hypertension   . Hyperlipidemia   .  History of kidney stones   . COPD (chronic obstructive pulmonary disease)   . HIV (human immunodeficiency virus infection)   . Dermatitis 11/27/2012  . Nocturia 03/06/2013  . Epicondylitis 06/05/2013    right    History  Substance Use Topics  . Smoking status: Never Smoker   . Smokeless tobacco: Never Used  . Alcohol Use: No    Family History  Problem Relation Age of Onset  . Arthritis      mother/father/paternal grandparents  . Breast cancer Maternal Aunt     paternal aunt  . Lung cancer Maternal Aunt   . Hyperlipidemia Mother   . Hyperlipidemia Father   . Heart disease      parents/maternal grandparents/ 2 brothers  . Stroke Paternal Grandmother   . Hypertension Father     paternal grandmother/3 brothers/1 sister  . Mental retardation Sister   . Diabetes Mother     paternal grandparents/1 brother   No Known Allergies  OBJECTIVE: Blood pressure 128/83, pulse 78, temperature 97.4 F (36.3 C), temperature source Oral, resp. rate 18, height 5\' 3"  (1.6 m), weight 119 lb 14.9 oz (54.4 kg), SpO2 95.00%. General: awake, alert, nad Skin: no rashes Lungs: CTA B Cor: RRR Abd: soft, hyperactive bowel sounds, tender Ext: no edema  Microbiology: Recent Results (from the past 240 hour(s))  MRSA PCR SCREENING     Status: None   Collection Time    03/23/14  1:50 AM  Result Value Ref Range Status   MRSA by PCR NEGATIVE  NEGATIVE Final   Comment:            The GeneXpert MRSA Assay (FDA     approved for NASAL specimens     only), is one component of a     comprehensive MRSA colonization     surveillance program. It is not     intended to diagnose MRSA     infection nor to guide or     monitor treatment for     MRSA infections.    Scharlene Gloss, [place] for Infect[facility] Disease [place] [email or web address] [phone] pager  [phone] cell 03/23/2014, 1:38 PM

## 2014-03-23 NOTE — Progress Notes (Signed)
CRITICAL VALUE ALERT  Critical value received: CO2 7  Date of notification:  03/23/14  Time of notification:  1000  Critical value read back: yes  Nurse who received alert:  LBass  MD notified (1st page): Titus Mould  Time of first page: 1000  MD notified (2nd page):  Time of second page:  Responding MD: Titus Mould  Time MD responded: 1000

## 2014-03-23 NOTE — Progress Notes (Signed)
Patient ID: [name], male   DOB: 1946-06-23, 68 y.o.   MRN: [id number]  Call from [facility] ER to [place] MD  Patient on 4 drug HIV Med called STribld. Went to [facility] ER wit nausea and vomit and found to in Pancreatitis - lipase > 3K, ATN in 1 month with acidosi and hyperkalemia - normal EKG. He is anuric there after 2L fluid but b/hr stable per ER doc and looks ok  Call for ICU bed  PULMONARY No results found for this basename: PHART, PCO2, PCO2ART, PO2, PO2ART, HCO3, TCO2, O2SAT,  in the last 168 hours  CBC  Recent Labs Lab 03/22/14 2111  HGB 12.8*  HCT 35.7*  WBC 15.8*  PLT 164    COAGULATION No results found for this basename: INR,  in the last 168 hours  CARDIAC  No results found for this basename: TROPONINI,  in the last 168 hours No results found for this basename: PROBNP,  in the last 168 hours   CHEMISTRY  Recent Labs Lab 03/22/14 1241 03/22/14 2111 03/22/14 2230  NA 128* 126* 127*  K 5.7* 6.2* 6.4*  CL 98 87* 91*  CO2 12* 8* 7*  GLUCOSE 166* 75 62*  BUN 60* 69* 69*  CREATININE 7.12* 7.40* 7.10*  CALCIUM 8.5 8.5 7.8*   Estimated Creatinine Clearance: 7.6 ml/min (by C-G formula based on Cr of 7.1).   LIVER  Recent Labs Lab 03/22/14 1241 03/22/14 2111 03/22/14 2230  AST 29 35 50*  ALT 20 23 32  ALKPHOS 51 56 50  BILITOT 0.9 0.6 0.7  PROT 6.5 7.5 6.7  ALBUMIN 4.1 3.7 3.3*     INFECTIOUS No results found for this basename: LATICACIDVEN, PROCALCITON,  in the last 168 hours   ENDOCRINE CBG (last 3)  No results found for this basename: GLUCAP,  in the last 72 hours       IMAGING x48h Ct Abdomen Pelvis Wo Contrast  03/22/2014   CLINICAL DATA:  Nausea, vomiting and diarrhea.  EXAM: CT ABDOMEN AND PELVIS WITHOUT CONTRAST  TECHNIQUE: Multidetector CT imaging of the abdomen and pelvis was performed following the standard protocol without IV contrast.  COMPARISON:  11/15/2013  FINDINGS: The lung bases are grossly clear.  The liver is  grossly normal and stable. Small calcified granulomas are noted. The gallbladder is grossly normal. No common bile duct dilatation. The pancreas is grossly normal. The spleen is normal in size. No focal lesions. The adrenal glands and kidneys are unremarkable without contrast. No hydronephrosis or obstructing ureteral calculi.  The stomach is moderately distended and there is interstitial changes around the stomach. Could not exclude partial gastric outlet obstruction. Mild wall thickening involving the duodenum but no obvious ulceration. No dilatation of the small bowel or colon. There is mesenteric edema and a small amount of abdominal and pelvic fluid which was also present on the prior study.  No mesenteric or retroperitoneal mass or adenopathy. The aorta is normal in caliber.  The bladder, prostate gland and seminal vesicles are unremarkable. No pelvic mass or adenopathy. There is a small amount of free pelvic fluid. No inguinal mass or adenopathy. Mild diffuse body wall edema.  IMPRESSION: 1. Distended stomach with surrounding interstitial change in fluid. Could not exclude a partial gastric outlet obstruction. 2. Mesenteric edema and scattered abdominal and pelvic fluid appears relatively stable when compared to prior CT scan. 3. Body wall edema.   Electronically Signed   By: Veneta Penton.D.  On: 03/22/2014 23:32      A ATN likely due to STRIBLD Pancreatitis likely due to STRIBLD  P abg stat rec CK rule out rhabdo and lactic acid check Start bicarb gtt 3rd L fluid bolus stat in ER Move to [facility] ICU bed; will need renal consult on arrival Accept patient to [facility]    Dr. Brand Males, M.D., [facility].C.P Pulmonary and Critical Care Medicine Staff Physician [facility] Pager: [phone], If no answer or between  15:00h - 7:00h: call [phone]  03/23/2014 12:17 AM

## 2014-03-23 NOTE — Progress Notes (Signed)
Utilization Review Completed.Donne Anon T8/20/2015

## 2014-03-23 NOTE — H&P (Signed)
PULMONARY / CRITICAL CARE MEDICINE   Name: JANE BROUGHTON MRN: [id number] DOB: Apr 24, 1946    ADMISSION DATE:  03/22/2014 CONSULTATION DATE:  03/23/2014  REFERRING MD :  [place]:  Emesis  INITIAL PRESENTATION: 68 year old male with HIV on STRIBILD presented to [place] c/o emesis, diarrhea, and generalized weakness x 3 days . In ED found to be in renal failure with elevated lipase. Transferred to [facility] for ICU admission and PCCM eval.    STUDIES:  8/19 CT abd/pelvis - Distended stomach with interstitial fluid changes, cannot exclude gastric outlet obstruction. Mesenteric edema. Abdominal/pelvic fluid stable from prior exam. Body wall edema. No hydronephrosis  SIGNIFICANT EVENTS: 8/19 admitted to ICU   HISTORY OF PRESENT ILLNESS: 68 yo male with PMH as below, which includes HIV, DM, CAD, COPD and pancreatitis who presents to the ED complaining of diarrhea and green emesis since 8/17. About 5-10 episodes of each daily.   He reports that his stool is watery with no blood.  8/19 began having difficulty with ambulation has been light headed. In ED he was in no distress and was hemodynamically stable. Routine labs showed renal failure, pancreatitis, and acidemia. He was transferred to [facility] for ICU admission. Of note he recently started on STRIBILD for ARV therapy after prior regimen caused pancreatitis in April. Labs were mainly normal during last ID OV 7/14.   PAST MEDICAL HISTORY :  Past Medical History  Diagnosis Date  . Arthritis   . Diabetes mellitus   . History of depression   . GERD (gastroesophageal reflux disease)   . CAD (coronary artery disease)     2003- cabg  . Hypertension   . Hyperlipidemia   . History of kidney stones   . COPD (chronic obstructive pulmonary disease)   . HIV (human immunodeficiency virus infection)   . Dermatitis 11/27/2012  . Nocturia 03/06/2013  . Epicondylitis 06/05/2013    right   Past Surgical History  Procedure Laterality  Date  . Coronary artery bypass graft  05/2002  . Vasectomy     Prior to Admission medications   Medication Sig Start Date End Date Taking? Authorizing Provider  amLODipine (NORVASC) 2.5 MG tablet TAKE 1 TABLET BY MOUTH DAILY    Mosie Lukes, MD  aspirin (ASPIRIN CHILDRENS) 81 MG chewable tablet Chew 1 tablet (81 mg total) by mouth daily. 10/28/12   Truman Hayward, MD  atenolol (TENORMIN) 50 MG tablet Take 1 tablet (50 mg total) by mouth daily. 02/28/14   Mosie Lukes, MD  atorvastatin (LIPITOR) 10 MG tablet Take 1 tablet (10 mg total) by mouth daily. 12/15/13   Michel Bickers, MD  b complex vitamins tablet Take 1 tablet by mouth daily.    Historical Provider, MD  elvitegravir-cobicistat-emtricitabine-tenofovir (STRIBILD) 150-150-200-300 MG TABS tablet Take 1 tablet by mouth daily with breakfast. 02/27/14   Carlyle Basques, MD  fenofibrate 160 MG tablet TAKE 1 TABLET BY MOUTH EVERY DAY    Mosie Lukes, MD  GLUCERNA [facility]) LIQD Take 1 Can by mouth 2 (two) times daily between meals. 04/20/13   Truman Hayward, MD  HYDROcodone-acetaminophen (NORCO/VICODIN) 5-325 MG per tablet Take 1-2 tablets by mouth every 4 (four) hours as needed for moderate pain. 01/25/14   Dorie Rank, MD  KRILL OIL OMEGA-3 PO Take 1 capsule by mouth daily.    Historical Provider, MD  Lancets (FREESTYLE) lancets USE TO CHECK BLOOD SUGAR EVERY DAY 01/05/14  Mosie Lukes, MD  lisinopril-hydrochlorothiazide (PRINZIDE,ZESTORETIC) 20-12.5 MG per tablet TAKE 2 TABLETS BY MOUTH EVERY DAY    Mosie Lukes, MD  metFORMIN (GLUMETZA) 1000 MG (MOD) 24 hr tablet Take 1 tablet (1,000 mg total) by mouth 2 (two) times daily with a meal. Had diarrhea on Metformin short acting 02/28/14   Mosie Lukes, MD  Misc Natural Products (FIBER SUPREME PO) Take 10 mLs by mouth daily.    Historical Provider, MD  omeprazole (PRILOSEC) 20 MG capsule TAKE 1 CAPSULE BY MOUTH EVERY DAY 01/30/14   Michel Bickers, MD  ondansetron (ZOFRAN-ODT) 8 MG  disintegrating tablet Take 1 tablet (8 mg total) by mouth every 8 (eight) hours as needed for nausea. 03/22/14   Brunetta Jeans, PA-C  Pancrelipase, Lip-Prot-Amyl, 24000 UNITS CPEP Take 1 capsule (24,000 Units total) by mouth 3 (three) times daily. 12/16/13   Mosie Lukes, MD  Probiotic Product (PROBIOTIC DAILY PO) Take by mouth daily.    Historical Provider, MD  valACYclovir (VALTREX) 500 MG tablet TAKE 1 TABLET BY MOUTH TWICE DAILY    Michel Bickers, MD   No Known Allergies  FAMILY HISTORY:  Family History  Problem Relation Age of Onset  . Arthritis      mother/father/paternal grandparents  . Breast cancer Maternal Aunt     paternal aunt  . Lung cancer Maternal Aunt   . Hyperlipidemia Mother   . Hyperlipidemia Father   . Heart disease      parents/maternal grandparents/ 2 brothers  . Stroke Paternal Grandmother   . Hypertension Father     paternal grandmother/3 brothers/1 sister  . Mental retardation Sister   . Diabetes Mother     paternal grandparents/1 brother   SOCIAL HISTORY:  reports that he has never smoked. He has never used smokeless tobacco. He reports that he does not drink alcohol or use illicit drugs.  REVIEW OF SYSTEMS:   Bolds are positive  Constitutional: weight loss, gain, night sweats, Fevers, chills, fatigue .  HEENT: headaches, Sore throat, sneezing, nasal congestion, post nasal drip, Difficulty swallowing, Tooth/dental problems, visual complaints visual changes, ear ache CV:  chest pain, radiates: ,Orthopnea, PND, swelling in lower extremities, dizziness, palpitations, syncope.  GI  heartburn, indigestion, abdominal pain, nausea, vomiting, diarrhea, change in bowel habits, loss of appetite, bloody stools.  Resp: cough, productive: , hemoptysis, dyspnea, chest pain, pleuritic.  Skin: rash or itching or icterus GU: dysuria, change in color of urine, urgency or frequency. flank pain, hematuria  MS: joint pain or swelling. decreased range of motion  Psych:  change in mood or affect. depression or anxiety.  Neuro: difficulty with speech, weakness, numbness, ataxia    SUBJECTIVE:   VITAL SIGNS: Temp:  [97.3 F (36.3 C)-97.4 F (36.3 C)] 97.3 F (36.3 C) (08/19 2351) Pulse Rate:  [65-77] 77 (08/20 0047) Resp:  [14-18] 17 (08/20 0047) BP: (118-140)/(61-84) 132/84 mmHg (08/20 0047) SpO2:  [99 %-100 %] 100 % (08/20 0047) Weight:  [51.37 kg (113 lb 4 oz)-51.71 kg (114 lb)] 51.71 kg (114 lb) (08/19 1927) HEMODYNAMICS:   VENTILATOR SETTINGS:   INTAKE / OUTPUT: No intake or output data in the 24 hours ending 03/23/14 0147  PHYSICAL EXAMINATION: General:  Thin male, drowsy, in NAD Neuro:  Alert, oriented, lethargic. Easily arousable, responds appropriately.  HEENT:  [place]/AT, bitemporal wasting, PERRL. Cardiovascular:  RRR, no MRG Lungs:  Clear anteriorly. Unlabored respirations.  Abdomen:  Distended, generalized tenderness.  Musculoskeletal:  No acute deformity or ROM limitation. No  peripheral edema.  Skin:  Intact.   LABS:  CBC  Recent Labs Lab 03/22/14 1241 03/22/14 2111  WBC 10.5 15.8*  HGB 13.0 12.8*  HCT 36.2* 35.7*  PLT 155 164   Coag's No results found for this basename: APTT, INR,  in the last 168 hours BMET  Recent Labs Lab 03/22/14 1241 03/22/14 2111 03/22/14 2230  NA 128* 126* 127*  K 5.7* 6.2* 6.4*  CL 98 87* 91*  CO2 12* 8* 7*  BUN 60* 69* 69*  CREATININE 7.12* 7.40* 7.10*  GLUCOSE 166* 75 62*   Electrolytes  Recent Labs Lab 03/22/14 1241 03/22/14 2111 03/22/14 2230  CALCIUM 8.5 8.5 7.8*   Sepsis Markers No results found for this basename: LATICACIDVEN, PROCALCITON, O2SATVEN,  in the last 168 hours ABG No results found for this basename: PHART, PCO2ART, PO2ART,  in the last 168 hours Liver Enzymes  Recent Labs Lab 03/22/14 1241 03/22/14 2111 03/22/14 2230  AST 29 35 50*  ALT 20 23 32  ALKPHOS 51 56 50  BILITOT 0.9 0.6 0.7  ALBUMIN 4.1 3.7 3.3*   Cardiac Enzymes No results found  for this basename: TROPONINI, PROBNP,  in the last 168 hours Glucose  Recent Labs Lab 03/23/14 0054  GLUCAP 144*    Imaging Ct Abdomen Pelvis Wo Contrast  03/22/2014   CLINICAL DATA:  Nausea, vomiting and diarrhea.  EXAM: CT ABDOMEN AND PELVIS WITHOUT CONTRAST  TECHNIQUE: Multidetector CT imaging of the abdomen and pelvis was performed following the standard protocol without IV contrast.  COMPARISON:  11/15/2013  FINDINGS: The lung bases are grossly clear.  The liver is grossly normal and stable. Small calcified granulomas are noted. The gallbladder is grossly normal. No common bile duct dilatation. The pancreas is grossly normal. The spleen is normal in size. No focal lesions. The adrenal glands and kidneys are unremarkable without contrast. No hydronephrosis or obstructing ureteral calculi.  The stomach is moderately distended and there is interstitial changes around the stomach. Could not exclude partial gastric outlet obstruction. Mild wall thickening involving the duodenum but no obvious ulceration. No dilatation of the small bowel or colon. There is mesenteric edema and a small amount of abdominal and pelvic fluid which was also present on the prior study.  No mesenteric or retroperitoneal mass or adenopathy. The aorta is normal in caliber.  The bladder, prostate gland and seminal vesicles are unremarkable. No pelvic mass or adenopathy. There is a small amount of free pelvic fluid. No inguinal mass or adenopathy. Mild diffuse body wall edema.  IMPRESSION: 1. Distended stomach with surrounding interstitial change in fluid. Could not exclude a partial gastric outlet obstruction. 2. Mesenteric edema and scattered abdominal and pelvic fluid appears relatively stable when compared to prior CT scan. 3. Body wall edema.   Electronically Signed   By: Kalman Jewels M.D.   On: 03/22/2014 23:32     ASSESSMENT / PLAN:  PULMONARY A: Acute on chronic respiratory failure COPD without evidence of  exacerbation.   P:   Supplemental O2 as needed to maintain SpO2 greater than 92% BD's PRN F/u CXR/ABG  CARDIOVASCULAR A:  H/o HTN, DAS s/p remote CABG  P:  Hold preadmission PO medications (amlodipine, atenolol, statin, fibrate, lisinopril/hctz) Monitor Repeat EKG  RENAL A:   Acute renal failure Hyperkalemia - treated in ED Hyponatremia High AG metabolic acidosis (suspect lactic, uremic)  P:   STAT Bmet Continue Bicarb gtt Insert foley Check FeNa Strict I&O Treat electrolytes as indicated Consult nephrology  tonight or in AM depending on Bmet results.   GASTROINTESTINAL A:   Severe acute pancreatitis, possibly 2nd to STRIBILD (hx of this with other ARV meds)  P:   NPO Aggressive IVF resuscitation as tolerated SUP: IV PPI PRN zofran  HEMATOLOGIC A:   Anemia Leukocytosis  P:  Follow CBC  INFECTIOUS A:   SIRS/Sepsis  HIV  P:   Hold ARV medications Consult ID 8/20 AM, followed by Dr. Megan Salon as outpt.  BCx2 8/20 >>> UC 8/20 >>>  ENDOCRINE A:   DM  P:   CBG monitoring and SSI Hold preadmission metformin  NEUROLOGIC A:   Acute encephalopathy in setting of acidosis - 8/20 drowsy, easily arousable  P:   RASS goal: 0 Monitor   Georgann Housekeeper, ACNP [place] [facility] Pager [phone] or [phone]   Reviewed above, examined, and agree.  68 yo male presents with vomiting, diarrhea likely from gastroenteritis.  He has acute kidney injury with metabolic acidosis and hyperkalemia 2nd to volume depletion, and meds.  He will need to continue aggressive volume resuscitation and continue HCO3 in IV fluid.  Will defer nephrology consult for now >> no indication of HD since labs are improving.  Will need to have ID consulted in AM to assist with HIV regimen..  CC time 85 minutes.  Chesley Mires, MD [facility] Pulmonary/Critical Care 03/23/2014, 5:30 AM Pager:  [phone] After 3pm call: [phone]

## 2014-03-23 NOTE — Telephone Encounter (Signed)
I am aware.  This has already been handled.

## 2014-03-23 NOTE — Telephone Encounter (Signed)
Ebony Hail w/[facility] called with Critical Lab: Creatinine - 7.12/SLS

## 2014-03-23 NOTE — Progress Notes (Signed)
ANTIBIOTIC CONSULT NOTE - INITIAL  Pharmacy Consult for Imipenem + Vanc Indication: rule out sepsis  No Known Allergies  Patient Measurements: Height: 5\' 3"  (160 cm) Weight: 119 lb 14.9 oz (54.4 kg) IBW/kg (Calculated) : 56.9  Vital Signs: Temp: 94.9 F (34.9 C) (08/20 0920) Temp src: Rectal (08/20 0920) BP: 125/64 mmHg (08/20 0900) Pulse Rate: 77 (08/20 0900) Intake/Output from previous day: 08/19 0701 - 08/20 0700 In: 2797.8 [I.V.:697.8; IV OMVEHMCNO:7096] Out: 420 [Urine:220; Stool:200] Intake/Output from this shift: Total I/O In: 230 [I.V.:230] Out: 32 [Urine:32]  Labs:  Recent Labs  03/22/14 1241  03/22/14 2111 03/22/14 2230 03/23/14 0256 03/23/14 0300 03/23/14 0845  WBC 10.5  --  15.8*  --   --  15.1*  --   HGB 13.0  --  12.8*  --   --  12.4*  --   PLT 155  --  164  --   --  133*  --   LABCREA  --   --   --   --  33.68  --   --   CREATININE 7.12*  < > 7.40* 7.10*  --  6.80* 6.72*  < > = values in this interval not displayed. Estimated Creatinine Clearance: 8.4 ml/min (by C-G formula based on Cr of 6.72). No results found for this basename: VANCOTROUGH, Corlis Leak, VANCORANDOM, [place], GENTPEAK, GENTRANDOM, TOBRATROUGH, TOBRAPEAK, TOBRARND, AMIKACINPEAK, AMIKACINTROU, AMIKACIN,  in the last 72 hours   Microbiology: Recent Results (from the past 720 hour(s))  MRSA PCR SCREENING     Status: None   Collection Time    03/23/14  1:50 AM      Result Value Ref Range Status   MRSA by PCR NEGATIVE  NEGATIVE Final   Comment:            The GeneXpert MRSA Assay (FDA     approved for NASAL specimens     only), is one component of a     comprehensive MRSA colonization     surveillance program. It is not     intended to diagnose MRSA     infection nor to guide or     monitor treatment for     MRSA infections.    Medical History: Past Medical History  Diagnosis Date  . Arthritis   . Diabetes mellitus   . History of depression   . GERD (gastroesophageal  reflux disease)   . CAD (coronary artery disease)     2003- cabg  . Hypertension   . Hyperlipidemia   . History of kidney stones   . COPD (chronic obstructive pulmonary disease)   . HIV (human immunodeficiency virus infection)   . Dermatitis 11/27/2012  . Nocturia 03/06/2013  . Epicondylitis 06/05/2013    right    Medications:  Prescriptions prior to admission  Medication Sig Dispense Refill  . amLODipine (NORVASC) 2.5 MG tablet TAKE 1 TABLET BY MOUTH DAILY  90 tablet  0  . aspirin (ASPIRIN CHILDRENS) 81 MG chewable tablet Chew 1 tablet (81 mg total) by mouth daily.  30 tablet  11  . atenolol (TENORMIN) 50 MG tablet Take 1 tablet (50 mg total) by mouth daily.  90 tablet  1  . atorvastatin (LIPITOR) 10 MG tablet Take 1 tablet (10 mg total) by mouth daily.  30 tablet  11  . b complex vitamins tablet Take 1 tablet by mouth daily.      Marland Kitchen elvitegravir-cobicistat-emtricitabine-tenofovir (STRIBILD) 150-150-200-300 MG TABS tablet Take 1 tablet by mouth daily with breakfast.  30 tablet  11  . fenofibrate 160 MG tablet TAKE 1 TABLET BY MOUTH EVERY DAY  30 tablet  0  . GLUCERNA (GLUCERNA) LIQD Take 1 Can by mouth 2 (two) times daily between meals.  48 Can  5  . HYDROcodone-acetaminophen (NORCO/VICODIN) 5-325 MG per tablet Take 1-2 tablets by mouth every 4 (four) hours as needed for moderate pain.  30 tablet  0  . KRILL OIL OMEGA-3 PO Take 1 capsule by mouth daily.      . Lancets (FREESTYLE) lancets USE TO CHECK BLOOD SUGAR EVERY DAY  100 each  0  . lisinopril-hydrochlorothiazide (PRINZIDE,ZESTORETIC) 20-12.5 MG per tablet TAKE 2 TABLETS BY MOUTH EVERY DAY  60 tablet  0  . metFORMIN (GLUMETZA) 1000 MG (MOD) 24 hr tablet Take 1 tablet (1,000 mg total) by mouth 2 (two) times daily with a meal. Had diarrhea on Metformin short acting  60 tablet  3  . Misc Natural Products (FIBER SUPREME PO) Take 10 mLs by mouth daily.      Marland Kitchen omeprazole (PRILOSEC) 20 MG capsule TAKE 1 CAPSULE BY MOUTH EVERY DAY  30 capsule   11  . ondansetron (ZOFRAN-ODT) 8 MG disintegrating tablet Take 1 tablet (8 mg total) by mouth every 8 (eight) hours as needed for nausea.  20 tablet  3  . Pancrelipase, Lip-Prot-Amyl, 24000 UNITS CPEP Take 1 capsule (24,000 Units total) by mouth 3 (three) times daily.  36 capsule  0  . Probiotic Product (PROBIOTIC DAILY PO) Take by mouth daily.      . valACYclovir (VALTREX) 500 MG tablet TAKE 1 TABLET BY MOUTH TWICE DAILY  60 tablet  0   Assessment: 68 yo M with multifactorial uncompensated metabolic acidosis and acute kidney failure. History of HIV, recently began treatment with Stribild d/t pancreatitis caused by previous ARV therapy. Pt hypothermic, T 94.9, WBC 15.1, CD4 6/25 = 320, holding ARV therapy for now. ID consulted. Pharmacy consulted to begin empiric imipenim + vancomycin. SCr 6.8, CrCl ~8.4, UOP 0.71mL/kg/hr  Goal of Therapy:  Vancomycin trough level 15-20 mcg/ml  Plan:  - Vanc 1g x 1 dose now then Vanc 750mg  q48 - Imipenim 250mg  IV q12h - Monitor clinical progress and cultures - Monitor renal function, may need dose adjustments  - [facility] at [facility] and clinically warranted  Thank you for allowing pharmacy to be part of this patient's care team  Ayerim Berquist M. Nestor Wieneke, Pharm.D Clinical Pharmacy Resident Pager: [phone] 03/23/2014 .10:56 AM

## 2014-03-24 ENCOUNTER — Encounter (HOSPITAL_COMMUNITY): Payer: Self-pay | Admitting: Physician Assistant

## 2014-03-24 ENCOUNTER — Inpatient Hospital Stay (HOSPITAL_COMMUNITY): Payer: Medicare Other

## 2014-03-24 DIAGNOSIS — R197 Diarrhea, unspecified: Secondary | ICD-10-CM

## 2014-03-24 DIAGNOSIS — K859 Acute pancreatitis without necrosis or infection, unspecified: Secondary | ICD-10-CM

## 2014-03-24 DIAGNOSIS — I369 Nonrheumatic tricuspid valve disorder, unspecified: Secondary | ICD-10-CM

## 2014-03-24 LAB — COMPREHENSIVE METABOLIC PANEL
ALK PHOS: 44 U/L (ref 39–117)
ALT: 43 U/L (ref 0–53)
ANION GAP: 28 — AB (ref 5–15)
AST: 42 U/L — ABNORMAL HIGH (ref 0–37)
Albumin: 2.3 g/dL — ABNORMAL LOW (ref 3.5–5.2)
BILIRUBIN TOTAL: 0.3 mg/dL (ref 0.3–1.2)
BUN: 74 mg/dL — AB (ref 6–23)
CO2: 13 meq/L — AB (ref 19–32)
Calcium: 6.2 mg/dL — CL (ref 8.4–10.5)
Chloride: 93 mEq/L — ABNORMAL LOW (ref 96–112)
Creatinine, Ser: 7.5 mg/dL — ABNORMAL HIGH (ref 0.50–1.35)
GFR, EST AFRICAN AMERICAN: 8 mL/min — AB (ref >90)
GFR, EST NON AFRICAN AMERICAN: 7 mL/min — AB (ref >90)
Glucose, Bld: 139 mg/dL — ABNORMAL HIGH (ref 70–99)
POTASSIUM: 4 meq/L (ref 3.7–5.3)
Sodium: 134 mEq/L — ABNORMAL LOW (ref 137–147)
Total Protein: 4.9 g/dL — ABNORMAL LOW (ref 6.0–8.3)

## 2014-03-24 LAB — BASIC METABOLIC PANEL
ANION GAP: 25 — AB (ref 5–15)
ANION GAP: 27 — AB (ref 5–15)
Anion gap: 33 — ABNORMAL HIGH (ref 5–15)
BUN: 77 mg/dL — ABNORMAL HIGH (ref 6–23)
BUN: 77 mg/dL — ABNORMAL HIGH (ref 6–23)
BUN: 83 mg/dL — ABNORMAL HIGH (ref 6–23)
CHLORIDE: 94 meq/L — AB (ref 96–112)
CHLORIDE: 92 meq/L — AB (ref 96–112)
CHLORIDE: 90 meq/L — AB (ref 96–112)
CO2: 16 mEq/L — ABNORMAL LOW (ref 19–32)
CO2: 16 meq/L — AB (ref 19–32)
CO2: 14 meq/L — AB (ref 19–32)
Calcium: 6.1 mg/dL — CL (ref 8.4–10.5)
Calcium: 6.5 mg/dL — ABNORMAL LOW (ref 8.4–10.5)
Calcium: 6.3 mg/dL — CL (ref 8.4–10.5)
Creatinine, Ser: 7.57 mg/dL — ABNORMAL HIGH (ref 0.50–1.35)
Creatinine, Ser: 7.65 mg/dL — ABNORMAL HIGH (ref 0.50–1.35)
Creatinine, Ser: 8.1 mg/dL — ABNORMAL HIGH (ref 0.50–1.35)
GFR calc Af Amer: 8 mL/min — ABNORMAL LOW (ref >90)
GFR calc Af Amer: 8 mL/min — ABNORMAL LOW (ref >90)
GFR calc Af Amer: 7 mL/min — ABNORMAL LOW (ref >90)
GFR calc non Af Amer: 7 mL/min — ABNORMAL LOW (ref >90)
GFR calc non Af Amer: 7 mL/min — ABNORMAL LOW (ref >90)
GFR calc non Af Amer: 6 mL/min — ABNORMAL LOW (ref >90)
GLUCOSE: 118 mg/dL — AB (ref 70–99)
GLUCOSE: 80 mg/dL (ref 70–99)
GLUCOSE: 125 mg/dL — AB (ref 70–99)
POTASSIUM: 3.9 meq/L (ref 3.7–5.3)
POTASSIUM: 4.1 meq/L (ref 3.7–5.3)
Potassium: 4.1 mEq/L (ref 3.7–5.3)
SODIUM: 135 meq/L — AB (ref 137–147)
SODIUM: 135 meq/L — AB (ref 137–147)
SODIUM: 137 meq/L (ref 137–147)

## 2014-03-24 LAB — GLUCOSE, CAPILLARY
GLUCOSE-CAPILLARY: 111 mg/dL — AB (ref 70–99)
GLUCOSE-CAPILLARY: 130 mg/dL — AB (ref 70–99)
GLUCOSE-CAPILLARY: 81 mg/dL (ref 70–99)
GLUCOSE-CAPILLARY: 95 mg/dL (ref 70–99)
Glucose-Capillary: 127 mg/dL — ABNORMAL HIGH (ref 70–99)
Glucose-Capillary: 103 mg/dL — ABNORMAL HIGH (ref 70–99)

## 2014-03-24 LAB — BLOOD GAS, ARTERIAL
Acid-base deficit: 12.6 mmol/L — ABNORMAL HIGH (ref 0.0–2.0)
Bicarbonate: 12 mEq/L — ABNORMAL LOW (ref 20.0–24.0)
DRAWN BY: 31101
O2 CONTENT: 3 L/min
O2 SAT: 91.7 %
PCO2 ART: 22.8 mmHg — AB (ref 35.0–45.0)
PO2 ART: 66.1 mmHg — AB (ref 80.0–100.0)
Patient temperature: 98.6
TCO2: 12.7 mmol/L (ref 0–100)
pH, Arterial: 7.343 — ABNORMAL LOW (ref 7.350–7.450)

## 2014-03-24 LAB — CBC WITH DIFFERENTIAL/PLATELET
BASOS ABS: 0 10*3/uL (ref 0.0–0.1)
Basophils Relative: 0 % (ref 0–1)
EOS ABS: 0 10*3/uL (ref 0.0–0.7)
Eosinophils Relative: 0 % (ref 0–5)
HEMATOCRIT: 31.9 % — AB (ref 39.0–52.0)
Hemoglobin: 11.5 g/dL — ABNORMAL LOW (ref 13.0–17.0)
LYMPHS ABS: 0.3 10*3/uL — AB (ref 0.7–4.0)
LYMPHS PCT: 3 % — AB (ref 12–46)
MCH: 33.8 pg (ref 26.0–34.0)
MCHC: 36.1 g/dL — ABNORMAL HIGH (ref 30.0–36.0)
MCV: 93.8 fL (ref 78.0–100.0)
Monocytes Absolute: 0.7 10*3/uL (ref 0.1–1.0)
Monocytes Relative: 7 % (ref 3–12)
Neutro Abs: 8.7 10*3/uL — ABNORMAL HIGH (ref 1.7–7.7)
Neutrophils Relative %: 90 % — ABNORMAL HIGH (ref 43–77)
PLATELETS: 74 10*3/uL — AB (ref 150–400)
RBC: 3.4 MIL/uL — ABNORMAL LOW (ref 4.22–5.81)
RDW: 13.4 % (ref 11.5–15.5)
WBC: 9.7 10*3/uL (ref 4.0–10.5)

## 2014-03-24 LAB — PHOSPHORUS: Phosphorus: 8.9 mg/dL — ABNORMAL HIGH (ref 2.3–4.6)

## 2014-03-24 LAB — URINE CULTURE
CULTURE: NO GROWTH
Colony Count: NO GROWTH

## 2014-03-24 LAB — EXPECTORATED SPUTUM ASSESSMENT W GRAM STAIN, RFLX TO RESP C

## 2014-03-24 LAB — FECAL LACTOFERRIN, QUANT: Fecal Lactoferrin: POSITIVE

## 2014-03-24 LAB — MAGNESIUM: Magnesium: 1.7 mg/dL (ref 1.5–2.5)

## 2014-03-24 LAB — EXPECTORATED SPUTUM ASSESSMENT W REFEX TO RESP CULTURE

## 2014-03-24 LAB — CLOSTRIDIUM DIFFICILE BY PCR: CDIFFPCR: NEGATIVE

## 2014-03-24 MED ORDER — FUROSEMIDE 10 MG/ML IJ SOLN
120.0000 mg | Freq: Once | INTRAMUSCULAR | Status: AC
Start: 2014-03-24 — End: 2014-03-24
  Administered 2014-03-24: 120 mg via INTRAVENOUS
  Filled 2014-03-24: qty 12

## 2014-03-24 MED ORDER — NEPRO/CARBSTEADY PO LIQD: 237.0000 mL | ORAL | Status: DC | PRN

## 2014-03-24 MED ORDER — SODIUM CHLORIDE 0.9 % IV SOLN
1.0000 g | Freq: Once | INTRAVENOUS | Status: AC
  Administered 2014-03-24: 1 g via INTRAVENOUS
  Filled 2014-03-24: qty 10

## 2014-03-24 MED ORDER — ALTEPLASE 2 MG IJ SOLR: 2.0000 mg | Freq: Once | INTRAMUSCULAR | Status: AC | PRN

## 2014-03-24 MED ORDER — FUROSEMIDE 10 MG/ML IJ SOLN
INTRAMUSCULAR | Status: AC
  Filled 2014-03-24: qty 12

## 2014-03-24 MED ORDER — HEPARIN SODIUM (PORCINE) 1000 UNIT/ML IJ SOLN
3.2000 mL | Freq: Once | INTRAMUSCULAR | Status: AC
  Administered 2014-03-24: 3200 [IU]
  Filled 2014-03-24: qty 3.2
  Filled 2014-03-24: qty 4

## 2014-03-24 MED ORDER — SODIUM CHLORIDE 0.9 % IV SOLN: 100.0000 mL | INTRAVENOUS | Status: DC | PRN

## 2014-03-24 MED ORDER — HYDROMORPHONE HCL PF 1 MG/ML IJ SOLN
0.5000 mg | INTRAMUSCULAR | Status: AC | PRN
  Administered 2014-03-24 (×3): 0.5 mg via INTRAVENOUS
  Filled 2014-03-24 (×3): qty 1

## 2014-03-24 MED ORDER — HEPARIN SODIUM (PORCINE) 1000 UNIT/ML DIALYSIS
1000.0000 [IU] | INTRAMUSCULAR | Status: DC | PRN
  Filled 2014-03-24: qty 1

## 2014-03-24 NOTE — Progress Notes (Addendum)
Called to see pt.  Rec 120mg  IV lasix w/o UOP.  Now on NRB w/ progressive hypoxia, SpO2 in low 90s. ARDS vs pulm edema. Is normotensive off pressors.    Pt w/ likely multifactorial ATN, volume overload, diuretic refractory.  Plan for attempted iHD this evening with goal of UF to improve resp status.    Update 0348 AM: Pt seen post HD with 4L UF.  Pt tolerated procedure.  BP stable. He is breathing more comfortably.  Still on NaHCO3 gtt, will stop for now since post HD. SPO2 in high 90s.

## 2014-03-24 NOTE — Progress Notes (Addendum)
CRITICAL VALUE ALERT  Critical value received: Calcium 6.2  Date of notification:  03/24/14  Time of notification:  0515  Critical value read back:Yes  Nurse who received alert: Rito Ehrlich  MD notified (1st page):  ELINK  Time of first page:  0515  MD notified (2nd page):  Time of second page:  Responding MD:  Chase Caller  Time MD responded:  [phone]

## 2014-03-24 NOTE — Clinical Documentation Improvement (Signed)
Presents with Sepsis with multi organ failure - ARF with possible ATN, Acute on Chronic Respiratory Failure, Acute Pancreatitis, Metabolic Encephalopathy, Severe Protein Calorie Malnutrition. Has a history of HIV, but HIV Disease is noted (ID Consult 8/20 - Active Problem List and in ED's PMH).   I have reviewed the patient's CD4 counts since 02/15/07 to 01/26/14 and they range from 610 to 320; no levels were less than 200. No AIDS related illnesses were documented.  Please clarify in order to code the chart correctly if the patient has: HIV Disease/Aids                    HIV (+) asymptomatic                    Other Condition   Thank You, Zoila Shutter ,RN Clinical Documentation Specialist:  [place] Information Management

## 2014-03-24 NOTE — Consult Note (Signed)
[facility] Consult: 12:45 PM 03/24/2014  LOS: 2 days    Referring Provider: Dr Titus Mould  Primary Care Physician:  Penni Homans, MD Primary Gastroenterologist:  Dr. Ardis Hughs     Reason for Consultation:  Investigate into cause of recurrent pancreatitis.    HPI: [name] is a 68 y.o. male.  HIV pt, compliant with ART. HIV viral load < 40, RNA quant < 20. Hep B surface Ab positive but otherwise negative Hepatitis serologies. In 09/2012. HIV, DM, CAD, COPD.   Pt does not drink ETOH  Acute pancreatitis admitted 4/14 - 11/23/13.  This attributed to HIV ART.  11/15/2013 CT with "acute inflammatory processes centered in the retroperitoneum and abuts the tail of the pancreas as well as the stomach, spleen and pericolic region on the left.. No associated pancreatic necrosis or focal abscess is identified. There is no evidence of pancreatic mass or calcification... Diffuse steatosis of the liver... Ileus of the jejunum."  Ultrasound 11/18/13 with trace GB sludge, thickened GB wall, hepatic steatosis.   11/19/13 HIDA was normal.  Lipase maxed at [facility] on 4/14, dropped as low as 72 but 217 on 4/22 discharge day.  Transaminases and Alk Phos normal Tbili max of 1.9.  His Combivir and Viramune were discontinued.  Pt was not seen by GI during the admission.   At [facility] 12/15/13 with ID, Dr Megan Salon: "He is improving after a recent bout of pancreatitis. I will not rechallenge him with Combivir or Viramune. I will start him on once daily Stribild. I will decrease the dose of his atorvastatin because of the interaction between it and Stribild".  MD also checked C diff as pt c/o diarrhea, PCR was negative.  Loose stools have continued to be an issue with up to 7 watery stools day and night. Generic OTC Imodium vs Lomotil dose relive the  diarrhea for up to a day.  Treated on 6/24 with Levaquin & hydrocodone for epididymo-orchitis  ++++++++++++++++++++++++++++++++++++++++++++++++++++++++++++++++++++++++++++++++++++  Went to ED yesterday with non-bloody, non-feculent, mostly bilious emesis, weakness x 3 days since 8/17.  Ongoing diarrhea of up to 7 or 8 watery stools with sediment but no blood. Not having abdominal pain, as he did in 11/2013.  Though when pressed does endorse slight discomfort:mostly tenderness in RUQ.   Admission Lipase >3000 on 8/19, 2024 on 8/20.  Amylase is 1822. AST max of 50 and otherwise normal LFTs. Labs c/w acute renal failure/acid gap acidosis/acute renal failure/acute resp failure/hyponatremia/hyperkalemia. .   CT scan 8/19: "Distended stomach with surrounding interstitial change in fluid. Could not exclude a partial gastric outlet obstruction. Mesenteric edema and scattered abdominal and pelvic fluid appears relatively stable when compared to prior CT scan. Body wall edema." bile duct/biliary tree, liver, pancreas are "grossly normal". Ultrasound 8/20 also with normal pancreas, no GB sludge or stones. CBD upper limits of normal to mildly dilated CBD, similar to April's ultrasound. Ascites resolved.   Echogenic kidneys.      Treated with Imipenem and Vanc. These have been discontinued by Dr Linus Salmons with blessing of CCM. C diff  is negative.  Dr Linus Salmons feels his ARV meds are less likely culprit for recurrent pancreatitis   Per renal, Dr Arty Baumgartner: "hold off on HD for now, however if his pulmonary status worsens and his UOP does not improve over the next 24 hours he may require a short session of HD."   Past Medical History  Diagnosis Date  . Arthritis   . Diabetes mellitus   . History of depression   . GERD (gastroesophageal reflux disease)   . CAD (coronary artery disease)     2003- cabg  . Hypertension   . Hyperlipidemia   . History of kidney stones   . COPD (chronic obstructive pulmonary disease)    . HIV (human immunodeficiency virus infection) 1991    on meds since initial dx.   . Dermatitis 11/27/2012  . Nocturia 03/06/2013  . Epicondylitis 06/05/2013    right  . Pancreatitis 11/2013    attributed to HIV meds.     Past Surgical History  Procedure Laterality Date  . Coronary artery bypass graft  05/2002  . Vasectomy      Prior to Admission medications   Medication Sig Start Date End Date Taking? Authorizing Provider  amLODipine (NORVASC) 2.5 MG tablet Take 2.5 mg by mouth daily.   Yes Historical Provider, MD  aspirin 81 MG chewable tablet Chew 81 mg by mouth daily.   Yes Historical Provider, MD  atenolol (TENORMIN) 50 MG tablet Take 50 mg by mouth daily.   Yes Historical Provider, MD  atorvastatin (LIPITOR) 10 MG tablet Take 10 mg by mouth daily.   Yes Historical Provider, MD  b complex vitamins tablet Take 1 tablet by mouth daily.   Yes Historical Provider, MD  elvitegravir-cobicistat-emtricitabine-tenofovir (STRIBILD) 150-150-200-300 MG TABS tablet Take 1 tablet by mouth daily with breakfast.   Yes Historical Provider, MD  fenofibrate 160 MG tablet Take 160 mg by mouth daily.   Yes Historical Provider, MD  GLUCERNA Barrie Folk) LIQD Take 1 Can by mouth 2 (two) times daily between meals. 04/20/13  Yes Truman Hayward, MD  HYDROcodone-acetaminophen (NORCO/VICODIN) 5-325 MG per tablet Take 1-2 tablets by mouth every 6 (six) hours as needed for moderate pain. Take one tablet when pain starts then may take another one if pain continues   Yes Historical Provider, MD  KRILL OIL OMEGA-3 PO Take 1 capsule by mouth daily.   Yes Historical Provider, MD  lisinopril-hydrochlorothiazide (PRINZIDE,ZESTORETIC) 20-12.5 MG per tablet Take 1 tablet by mouth 2 (two) times daily. Take twice daily per patient   Yes Historical Provider, MD  metFORMIN (GLUCOPHAGE) 1000 MG tablet Take 1,000 mg by mouth 2 (two) times daily with a meal.   Yes Historical Provider, MD  Misc Natural Products (FIBER SUPREME  PO) Take 10 mLs by mouth daily.   Yes Historical Provider, MD  omeprazole (PRILOSEC) 20 MG capsule Take 20 mg by mouth daily.   Yes Historical Provider, MD  ondansetron (ZOFRAN-ODT) 8 MG disintegrating tablet Take 8 mg by mouth every 8 (eight) hours as needed for nausea or vomiting.   Yes Historical Provider, MD  Pancrelipase, Lip-Prot-Amyl, 24000 UNITS CPEP Take 1 capsule by mouth 3 (three) times daily. 12/16/13  Yes Mosie Lukes, MD  Probiotic Product (PROBIOTIC DAILY PO) Take 1 tablet by mouth daily.    Yes Historical Provider, MD  valACYclovir (VALTREX) 500 MG tablet Take 500 mg by mouth 2 (two) times daily.   Yes Historical Provider, MD  Lancets (FREESTYLE) lancets USE TO CHECK BLOOD  SUGAR EVERY DAY 01/05/14   Mosie Lukes, MD    Scheduled Meds: . antiseptic oral rinse  7 mL Mouth Rinse q12n4p  . calcium gluconate  1 g Intravenous Once  . chlorhexidine  15 mL Mouth Rinse BID  . insulin aspart  2-6 Units Subcutaneous 6 times per day  . pantoprazole (PROTONIX) IV  40 mg Intravenous QHS   Infusions: . sodium chloride 10 mL/hr at 03/23/14 0600  .  sodium bicarbonate  infusion 1000 mL 50 mL/hr at 03/24/14 0818   PRN Meds: HYDROmorphone (DILAUDID) injection, ondansetron (ZOFRAN) IV   Allergies as of 03/22/2014  . (No Known Allergies)    Family History  Problem Relation Age of Onset  . Arthritis      mother/father/paternal grandparents  . Breast cancer Maternal Aunt     paternal aunt  . Lung cancer Maternal Aunt   . Hyperlipidemia Mother   . Hyperlipidemia Father   . Heart disease      parents/maternal grandparents/ 2 brothers  . Stroke Paternal Grandmother   . Hypertension Father     paternal grandmother/3 brothers/1 sister  . Mental retardation Sister   . Diabetes Mother     paternal grandparents/1 brother    History   Social History  . Marital Status: Divorced    Spouse Name: N/A    Number of Children: 29  . Years of Education: N/A   Occupational History  .  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    Social History Main Topics  . Smoking status: Never Smoker   . Smokeless tobacco: Never Used  . Alcohol Use: No  . Drug Use: No  . Sexual Activity: Not Currently     Comment: declined condoms   Other Topics Concern  . Not on file   Social History Narrative   Lives alone.  Supportive friends and family.  His HIV Dx is not a secret.     REVIEW OF SYSTEMS: Constitutional:  Overall stable weight until recent days ENT:  No nose bleeds Pulm:  + sputum with cough.  No home oxygen.  CV:  No palpitations, no LE edema.  GU:  No hematuria, no frequency GI:  Per HPI.  No dysphagia, no heartburn/indigestion. Heme:  No unusual bleeding or bruising.    Transfusions:  none Neuro:  No headaches, no peripheral tingling or numbness Psych:  No claustrophobia.  Feels he would have no problem with MRI.  Derm:  No itching, no rash or sores.  Endocrine:  No sweats or chills.  No polyuria or dysuria Immunization:  Not queried.  Travel:  None beyond local counties in last few months.    PHYSICAL EXAM: Vital signs in last 24 hours: Filed Vitals:   03/24/14 1000  BP: 151/75  Pulse: 86  Temp:   Resp: 20   Wt Readings from Last 3 Encounters:  03/24/14 57.9 kg (127 lb 10.3 oz)  03/22/14 51.37 kg (113 lb 4 oz)  02/28/14 51.71 kg (114 lb)    General: pleasant, but ill appearing AAM.  comfortable Head:  No asymmetry or swelling  Eyes:  No icterus or pallor Ears:  Not HOH  Nose:  No congestion or discharge Mouth:  Clear, slightly dry but clear oral MM Neck:  No mass, no JVD Lungs:  Ronchorous upper airway sounds with speech, wet vocal quality.  SOB with speech.  Heart: RRR.  No MRG Abdomen:  Soft, tender without guard or  rebound in RUQ.  No mass, no HSM.  Small umbilical hernia.   Rectal: deferred.    Musc/Skeltl: no joint swelling, deformity or redness Extremities:  No CCE.  Feet warm and brisk cap refill in  toes.  Neurologic:  Oriented x 3. Limb strength full.  No tremor.  No gross deficits.  + lethargic c/w acute illness. Skin:  No rash, sores, jaundice Tattoos:  none Nodes:  No cervical adenopathy   Psych:  Cooperative, alert, speech slow but fluid.  Relaxed. Arouses easily  Intake/Output from previous day: 08/20 0701 - 08/21 0700 In: 3490 [I.V.:2690; IV Piggyback:800] Out: 148 [Urine:148] Intake/Output this shift: Total I/O In: 245 [I.V.:245] Out: 30 [Urine:30]  LAB RESULTS:  Recent Labs  03/22/14 2111 03/23/14 0300 03/24/14 0420  WBC 15.8* 15.1* 9.7  HGB 12.8* 12.4* 11.5*  HCT 35.7* 35.6* 31.9*  PLT 164 133* 74*   BMET Lab Results  Component Value Date   NA 135* 03/24/2014   NA 134* 03/24/2014   NA 133* 03/23/2014   K 4.1 03/24/2014   K 4.0 03/24/2014   K 4.3 03/23/2014   CL 94* 03/24/2014   CL 93* 03/24/2014   CL 97 03/23/2014   CO2 16* 03/24/2014   CO2 13* 03/24/2014   CO2 11* 03/23/2014   GLUCOSE 118* 03/24/2014   GLUCOSE 139* 03/24/2014   GLUCOSE 82 03/23/2014   BUN 77* 03/24/2014   BUN 74* 03/24/2014   BUN 67* 03/23/2014   CREATININE 7.57* 03/24/2014   CREATININE 7.50* 03/24/2014   CREATININE 7.06* 03/23/2014   CALCIUM 6.1* 03/24/2014   CALCIUM 6.2* 03/24/2014   CALCIUM 6.3* 03/23/2014   LFT  Recent Labs  03/22/14 2111 03/22/14 2230 03/24/14 0420  PROT 7.5 6.7 4.9*  ALBUMIN 3.7 3.3* 2.3*  AST 35 50* 42*  ALT 23 32 43  ALKPHOS 56 50 44  BILITOT 0.6 0.7 0.3   PT/INR Lab Results  Component Value Date   INR 1.56* 03/23/2014   INR 0.96 07/18/2010   Hepatitis Panel No results found for this basename: HEPBSAG, HCVAB, HEPAIGM, HEPBIGM,  in the last 72 hours C-Diff No components found with this basename: cdiff   Lipase     Component Value Date/Time   LIPASE 2024* 03/23/2014 1100    Drugs of Abuse     Component Value Date/Time   LABOPIA NEGATIVE 07/19/2010 0249   COCAINSCRNUR NEGATIVE 07/19/2010 0249   LABBENZ NEGATIVE 07/19/2010 0249   AMPHETMU NEGATIVE  07/19/2010 0249     RADIOLOGY STUDIES: Ct Abdomen Pelvis Wo Contrast 03/22/2014   .  COMPARISON:  11/15/2013  FINDINGS: The lung bases are grossly clear.  The liver is grossly normal and stable. Small calcified granulomas are noted. The gallbladder is grossly normal. No common bile duct dilatation. The pancreas is grossly normal. The spleen is normal in size. No focal lesions. The adrenal glands and kidneys are unremarkable without contrast. No hydronephrosis or obstructing ureteral calculi.  The stomach is moderately distended and there is interstitial changes around the stomach. Could not exclude partial gastric outlet obstruction. Mild wall thickening involving the duodenum but no obvious ulceration. No dilatation of the small bowel or colon. There is mesenteric edema and a small amount of abdominal and pelvic fluid which was also present on the prior study.  No mesenteric or retroperitoneal mass or adenopathy. The aorta is normal in caliber.  The bladder, prostate gland and seminal vesicles are unremarkable. No pelvic mass or adenopathy. There is a small amount of free  pelvic fluid. No inguinal mass or adenopathy. Mild diffuse body wall edema.  IMPRESSION: 1. Distended stomach with surrounding interstitial change in fluid. Could not exclude a partial gastric outlet obstruction. 2. Mesenteric edema and scattered abdominal and pelvic fluid appears relatively stable when compared to prior CT scan. 3. Body wall edema.   Electronically Signed   By: Kalman Jewels M.D.   On: 03/22/2014 23:32   US Abdomen Complete 03/23/2014     COMPARISON:  CT Abdomen and Pelvis without contrast 03/22/2014. Abdomen ultrasound 11/18/2013.  FINDINGS: Gallbladder:  Wall thickening stable to decreased since April. No echogenic stones or sludge. No sonographic Murphy sign elicited.  Common bile duct:  Diameter: 6-8 mm diameter, upper limits of normal to mildly dilated (was 7 mm in April).  Liver:  Increased echogenicity, stable. No  discrete liver lesion. No intrahepatic ductal dilatation identified.  IVC:  Incompletely visualized due to overlying bowel gas, visualized portions within normal limits.  Pancreas:  Incompletely visualized due to overlying bowel gas, visualized portions within normal limits.  Spleen:  Size and appearance within normal limits.  Right Kidney:  Length: 11.6 cm. Mildly echogenic. No hydronephrosis or renal mass.  Left Kidney:  Length: 11.6 cm. Mildly echogenic. No hydronephrosis or left renal mass.  Abdominal aorta:  Incompletely visualized due to overlying bowel gas, visualized portions within normal limits.  Other findings:  No ascites identified.  IMPRESSION: 1. Visualized pancreas has a normal sonographic appearance. 2. No biliary sludge or gallstones. Upper limits of normal to mildly dilated CBD, the similar to the abdomen ultrasound in April. No intrahepatic biliary ductal dilatation identified. 3. Resolved small volume ascites. 4. Mildly echogenic kidneys, could reflect chronic medical renal disease or less likely developing HIV nephropathy.   Electronically Signed   By: Lars Pinks M.D.   On: 03/23/2014 16:09   Dg Chest Port 1 View 03/24/2014  COMPARISON:  03/23/2014  FINDINGS: Unchanged cardiomegaly.  The patient is status post CABG.  Perihilar interstitial and airspace opacities persist. There are small bilateral pleural effusions.  Unchanged positioning of left IJ catheter, tip at the level of the right atrium. No pneumothorax.  IMPRESSION: 1. Unchanged pulmonary edema with small pleural effusions. 2. Left IJ catheter, tip at the right atrium.   Electronically Signed   By: Jorje Guild M.D.   On: 03/24/2014 05:27   Dg Abd Portable 1v 03/24/2014    COMPARISON:  Abdominal CT 03/22/2014  FINDINGS: There is diffuse mild gaseous distension of small bowel, measuring up to 3.5 cm diameter. No definite wall thickening. There is a relative paucity of colonic gas, although transverse colon gas is present. Lung  bases are clear. No concerning intra-abdominal mass effect.  IMPRESSION: Gaseous distention of small bowel. Given no obstruction present on recent abdominal CT, this likely reflects mild ileus or enteritis.   Electronically Signed   By: Jorje Guild M.D.   On: 03/24/2014 05:35    ENDOSCOPIC STUDIES: 07/2012  Colonoscopy For minor rectal bleeding Small, sessile ascending polyp.  Non-thrombosed external hemorrhoids likely cause of minor bleeding. Pathology:  TUBULAR ADENOMA; NEGATIVE FOR HIGH GRADE DYSPLASIA OR MALIGNANCY.  Needs surveillance study 07/2017.   IMPRESSION:   *  Recurrent pancreatitis.  Interesting that Lipase is elevated, but pancreas imaging normal.  In April had gallbladder sludge but normal HIDA.  Current CT scan with no pancreatitis but interstitial changes in stomach and thickened SB with possible GOO.   Current Ultrasound reading with prominent 6 - 8 mm CBD.  Previous fatty liver has resolved  *  SB ileus. May need enteroscopy to evaluate given thickened duodenum and chronic diarrhea in HIV pt to rule out neoplasia/infection.   *  Chronic diarrhea for at least 6 months.  C diff and stool pathogen panels negative starting in 11/2013.  Colonoscopy 07/2012 with tubular adenomatous polyp and hemorrhoids.  CT with slight duodenal thickening but normal colonoscopy.   *  ARF.  Electrolytes improving. BUN/Creat not improved. Oliguric. Renal following.   *  Acute resp failure.   *  HIV.  Compliant with ART.  combivir and viramune stopped in 11/2013 when felt to be cause of pancreatitis.  Stribild initiated in mid 11/2013.     PLAN:     *  MRCP? This would need to wait as contrast for MRCP contraindicated in acute renal failure. ?EGD to assess stomach and SB?   *  Allowed sips/chips. If he vomits will make him NPO again. Dr Titus Mould ok with this.    Azucena Freed  03/24/2014, 12:45 PM Pager: [phone]      Attending physician's note   I have taken a history,  examined the patient and reviewed the chart. I agree with the Advanced Practitioner's note, impression and recommendations. Complicated patient with recurrent pancreatitis. First episode felt possibly secondary to HIV medications, Combivir and Viramune, and they were stopped. Etiology of pancreatitis is not clear. He also has chronic diarrhea without a clear etiology. Imaging studies show gastric distention, SB distention, duodenal thickening but no pancreatic or biliary abnormalities. Consider MRCP, EGD when respiratory status has substantially improved. Standard supportive mgmt for pancreatitis and ileus with bowel rest, adequate hydration, etc.   Ladene Artist, MD [facility]

## 2014-03-24 NOTE — Procedures (Addendum)
Central Venous dialysis Catheter Insertion Procedure Note [name] [id number] 1946-01-31  Procedure: Insertion of Central Venous Catheter Indications: Frequent blood sampling  Procedure Details Consent: Risks of procedure as well as the alternatives and risks of each were explained to the (patient/caregiver).  Consent for procedure obtained. Time Out: Verified patient identification, verified procedure, site/side was marked, verified correct patient position, special equipment/implants available, medications/allergies/relevent history reviewed, required imaging and test results available.  Performed  Maximum sterile technique was used including antiseptics, cap, gloves, gown, hand hygiene, mask and sheet. Skin prep: Chlorhexidine; local anesthetic administered A antimicrobial bonded/coated triple lumen catheter was placed in the right femoral vein due to patient being a dialysis patient using the Seldinger technique.  Evaluation Blood flow good Complications: No apparent complications Patient did tolerate procedure well.   BABCOCK,PETE 03/24/2014, 4:29 PM  I was present for and supervised the entire procedure  Merton Border, MD ; [facility] [phone].  After 5:30 PM or weekends, call [phone]

## 2014-03-24 NOTE — Progress Notes (Signed)
CRITICAL VALUE ALERT  Critical value received:  Calcium 6.3  Date of notification:  March 24, 2014  Time of notification:  2316  Critical value read back:Yes.    Nurse who received alert:  Rito Ehrlich  MD notified (1st page):  CCM/ELINK  Time of first page:  2319  MD notified (2nd page):  Time of second page:  Responding MD:  CCM/ELINK  Time MD responded:  2319

## 2014-03-24 NOTE — Progress Notes (Signed)
Patient ID: [name], male   DOB: 10-Feb-1946, 68 y.o.   MRN: [id number] S:no new complaints.  Breathing is "so-so" O:BP 139/78  Pulse 82  Temp(Src) 97.7 F (36.5 C) (Oral)  Resp 19  Ht 5\' 3"  (1.6 m)  Wt 57.9 kg (127 lb 10.3 oz)  BMI 22.62 kg/m2  SpO2 94%  Intake/Output Summary (Last 24 hours) at 03/24/14 0916 Last data filed at 03/24/14 0800  Gross per 24 hour  Intake   3150 ml  Output    166 ml  Net   2984 ml   Intake/Output: I/O last 3 completed shifts: In: 6187.8 [I.V.:3287.8; IV Piggyback:2900] Out: 568 [Urine:368; Stool:200]  Intake/Output this shift:  Total I/O In: -  Out: 30 [Urine:30] Weight change: 6.19 kg (13 lb 10.3 oz) Gen:WD WM in NAD CVS:no rub Resp:occ rhonchi Abd: distended, diminished BS, mild tenderness, no guarding/rebound Ext:no edema   Recent Labs Lab 03/22/14 1241 03/22/14 2111 03/22/14 2230 03/23/14 0300 03/23/14 0845 03/23/14 1020 03/23/14 1911 03/24/14 0420  NA 128* 126* 127* 128* 134* 134* 133* 134*  K 5.7* 6.2* 6.4* 5.5* 4.6 4.5 4.3 4.0  CL 98 87* 91* 94* 99 99 97 93*  CO2 12* 8* 7* 7* 7* 9* 11* 13*  GLUCOSE 166* 75 62* 134* 80 78 82 139*  BUN 60* 69* 69* 65* 62* 63* 67* 74*  CREATININE 7.12* 7.40* 7.10* 6.80* 6.72* 6.81* 7.06* 7.50*  ALBUMIN 4.1 3.7 3.3*  --   --   --   --  2.3*  CALCIUM 8.5 8.5 7.8* 7.6* 6.9* 6.5* 6.3* 6.2*  PHOS  --   --   --  8.8*  --   --   --  8.9*  AST 29 35 50*  --   --   --   --  42*  ALT 20 23 32  --   --   --   --  43   Liver Function Tests:  Recent Labs Lab 03/22/14 2111 03/22/14 2230 03/24/14 0420  AST 35 50* 42*  ALT 23 32 43  ALKPHOS 56 50 44  BILITOT 0.6 0.7 0.3  PROT 7.5 6.7 4.9*  ALBUMIN 3.7 3.3* 2.3*    Recent Labs Lab 03/22/14 2111 03/23/14 1100  LIPASE >3000* 2024*  AMYLASE  --  1822*   No results found for this basename: AMMONIA,  in the last 168 hours CBC:  Recent Labs Lab 03/22/14 1241 03/22/14 2111 03/23/14 0300 03/24/14 0420  WBC 10.5 15.8* 15.1* 9.7   NEUTROABS  --  13.7*  --  8.7*  HGB 13.0 12.8* 12.4* 11.5*  HCT 36.2* 35.7* 35.6* 31.9*  MCV 94.3 96.7 97.8 93.8  PLT 155 164 133* 74*   Cardiac Enzymes:  Recent Labs Lab 03/23/14 0008 03/23/14 0300 03/23/14 0826 03/23/14 1911  CKTOTAL 345*  --   --   --   TROPONINI  --  <0.30 <0.30 <0.30   CBG:  Recent Labs Lab 03/23/14 1559 03/23/14 1933 03/24/14 0005 03/24/14 0410 03/24/14 0804  GLUCAP 95 81 95 130* 111*    Iron Studies: No results found for this basename: IRON, TIBC, TRANSFERRIN, FERRITIN,  in the last 72 hours Studies/Results: Ct Abdomen Pelvis Wo Contrast  03/22/2014   CLINICAL DATA:  Nausea, vomiting and diarrhea.  EXAM: CT ABDOMEN AND PELVIS WITHOUT CONTRAST  TECHNIQUE: Multidetector CT imaging of the abdomen and pelvis was performed following the standard protocol without IV contrast.  COMPARISON:  11/15/2013  FINDINGS: The lung bases are  grossly clear.  The liver is grossly normal and stable. Small calcified granulomas are noted. The gallbladder is grossly normal. No common bile duct dilatation. The pancreas is grossly normal. The spleen is normal in size. No focal lesions. The adrenal glands and kidneys are unremarkable without contrast. No hydronephrosis or obstructing ureteral calculi.  The stomach is moderately distended and there is interstitial changes around the stomach. Could not exclude partial gastric outlet obstruction. Mild wall thickening involving the duodenum but no obvious ulceration. No dilatation of the small bowel or colon. There is mesenteric edema and a small amount of abdominal and pelvic fluid which was also present on the prior study.  No mesenteric or retroperitoneal mass or adenopathy. The aorta is normal in caliber.  The bladder, prostate gland and seminal vesicles are unremarkable. No pelvic mass or adenopathy. There is a small amount of free pelvic fluid. No inguinal mass or adenopathy. Mild diffuse body wall edema.  IMPRESSION: 1. Distended  stomach with surrounding interstitial change in fluid. Could not exclude a partial gastric outlet obstruction. 2. Mesenteric edema and scattered abdominal and pelvic fluid appears relatively stable when compared to prior CT scan. 3. Body wall edema.   Electronically Signed   By: Kalman Jewels M.D.   On: 03/22/2014 23:32   US Abdomen Complete  03/23/2014   CLINICAL DATA:  68 year old male with HIV presenting with emesis, diarrhea, weakness, acute renal failure and abnormal lipase. Initial encounter.  EXAM: ULTRASOUND ABDOMEN COMPLETE  COMPARISON:  CT Abdomen and Pelvis without contrast 03/22/2014. Abdomen ultrasound 11/18/2013.  FINDINGS: Gallbladder:  Wall thickening stable to decreased since April. No echogenic stones or sludge. No sonographic Murphy sign elicited.  Common bile duct:  Diameter: 6-8 mm diameter, upper limits of normal to mildly dilated (was 7 mm in April).  Liver:  Increased echogenicity, stable. No discrete liver lesion. No intrahepatic ductal dilatation identified.  IVC:  Incompletely visualized due to overlying bowel gas, visualized portions within normal limits.  Pancreas:  Incompletely visualized due to overlying bowel gas, visualized portions within normal limits.  Spleen:  Size and appearance within normal limits.  Right Kidney:  Length: 11.6 cm. Mildly echogenic. No hydronephrosis or renal mass.  Left Kidney:  Length: 11.6 cm. Mildly echogenic. No hydronephrosis or left renal mass.  Abdominal aorta:  Incompletely visualized due to overlying bowel gas, visualized portions within normal limits.  Other findings:  No ascites identified.  IMPRESSION: 1. Visualized pancreas has a normal sonographic appearance. 2. No biliary sludge or gallstones. Upper limits of normal to mildly dilated CBD, the similar to the abdomen ultrasound in April. No intrahepatic biliary ductal dilatation identified. 3. Resolved small volume ascites. 4. Mildly echogenic kidneys, could reflect chronic medical renal  disease or less likely developing HIV nephropathy.   Electronically Signed   By: Lars Pinks M.D.   On: 03/23/2014 16:09   Dg Chest Port 1 View  03/24/2014   CLINICAL DATA:  Difficulty breathing  EXAM: PORTABLE CHEST - 1 VIEW  COMPARISON:  03/23/2014  FINDINGS: Unchanged cardiomegaly.  The patient is status post CABG.  Perihilar interstitial and airspace opacities persist. There are small bilateral pleural effusions.  Unchanged positioning of left IJ catheter, tip at the level of the right atrium. No pneumothorax.  IMPRESSION: 1. Unchanged pulmonary edema with small pleural effusions. 2. Left IJ catheter, tip at the right atrium.   Electronically Signed   By: Jorje Guild M.D.   On: 03/24/2014 05:27   Dg Chest Portable 1  View  03/23/2014   CLINICAL DATA:  Left IJ catheter placement.  EXAM: PORTABLE CHEST - 1 VIEW  COMPARISON:  PA and lateral chest 10/18/2012.  FINDINGS: A new left IJ catheter is in place with the tip in the right atrium. Recommend withdrawal of 5.5 cm. Mild interstitial edema is present. There is no pneumothorax. Heart size is upper normal.  IMPRESSION: Tip of left IJ catheter is in the right atrium. Recommend withdrawal of 5.5 cm. Negative for pneumothorax.  Interstitial edema.  These results will be called to the ordering clinician or representative by the Radiologist Assistant, and communication documented in the PACS or zVision Dashboard.   Electronically Signed   By: Inge Rise M.D.   On: 03/23/2014 13:08   Dg Abd Portable 1v  03/24/2014   CLINICAL DATA:  Abdominal pain  EXAM: PORTABLE ABDOMEN - 1 VIEW  COMPARISON:  Abdominal CT 03/22/2014  FINDINGS: There is diffuse mild gaseous distension of small bowel, measuring up to 3.5 cm diameter. No definite wall thickening. There is a relative paucity of colonic gas, although transverse colon gas is present. Lung bases are clear. No concerning intra-abdominal mass effect.  IMPRESSION: Gaseous distention of small bowel. Given no  obstruction present on recent abdominal CT, this likely reflects mild ileus or enteritis.   Electronically Signed   By: Jorje Guild M.D.   On: 03/24/2014 05:35   . antiseptic oral rinse  7 mL Mouth Rinse q12n4p  . chlorhexidine  15 mL Mouth Rinse BID  . imipenem-cilastatin  250 mg Intravenous Q12H  . insulin aspart  2-6 Units Subcutaneous 6 times per day  . pantoprazole (PROTONIX) IV  40 mg Intravenous QHS  . [START ON 03/25/2014] vancomycin  750 mg Intravenous Q48H    BMET    Component Value Date/Time   NA 134* 03/24/2014 0420   K 4.0 03/24/2014 0420   CL 93* 03/24/2014 0420   CO2 13* 03/24/2014 0420   GLUCOSE 139* 03/24/2014 0420   BUN 74* 03/24/2014 0420   CREATININE 7.50* 03/24/2014 0420   CREATININE 7.12* 03/22/2014 1241   CALCIUM 6.2* 03/24/2014 0420   GFRNONAA 7* 03/24/2014 0420   GFRNONAA >60 04/17/2011 0851   GFRAA 8* 03/24/2014 0420   GFRAA >60 04/17/2011 0851   CBC    Component Value Date/Time   WBC 9.7 03/24/2014 0420   RBC 3.40* 03/24/2014 0420   HGB 11.5* 03/24/2014 0420   HCT 31.9* 03/24/2014 0420   PLT 74* 03/24/2014 0420   MCV 93.8 03/24/2014 0420   MCH 33.8 03/24/2014 0420   MCHC 36.1* 03/24/2014 0420   RDW 13.4 03/24/2014 0420   LYMPHSABS 0.3* 03/24/2014 0420   MONOABS 0.7 03/24/2014 0420   EOSABS 0.0 03/24/2014 0420   EOSABS 0.3 K/UL 06/12/2006 2009   BASOSABS 0.0 03/24/2014 0420   67yo BM with HIV admitted 03/22/14 for 3wks N/V/D that significantly worsened over last 3 days. PO intake has been poor and he was on lisinopril. No hx renal disease though has had HTN x 70yrs and DM x 3-10yrs. Labs suggest pancreatitis with elevated amylase and lipase. Bicarb low and has been on metformin. Scr 02/20/14 was .87 and on admission was 7.12    Assessment/Plan:  1. ARF, oliguric in setting of volume depletion, ACE-inhibition, metformin, and pancreatitis.   1. No significant improvement of Scr with IVF's but UOP of 200 yesterday. 2. Will hold off on HD for now, however if his  pulmonary status worsens and his UOP does not  improve over the next 24 hours he may require a short session of HD. 2. Metabolic Acidosis sec ARF, diarrhea +/- metformin 1. Some improvement with IV bicarb and holding metformin 3. Pancreatitis- per PCCM 4. Hyponatremia- improving with volume replacement 5. Hypoxia- agree with decreasing IVF's per PCCM and cont to follow closely 6. HIV- per ID 7. HTN- stable 8. DM- per PCCM 9. Hypocalcemia- worsened with correction of acidosis,  Will follow ?saponification of pancreatitis.  Replete per PCCM  Yamato Kopf A

## 2014-03-24 NOTE — Progress Notes (Signed)
  Echocardiogram 2D Echocardiogram has been performed.  [name] 03/24/2014, 9:47 AM

## 2014-03-24 NOTE — H&P (Signed)
PULMONARY / CRITICAL CARE MEDICINE   Name: [name] MRN: [id number] DOB: 11/10/1945    ADMISSION DATE:  03/22/2014 CONSULTATION DATE:  03/23/2014  REFERRING MD :  Pollock:  Emesis  INITIAL PRESENTATION: 68 year old male with HIV on STRIBILD presented to [facility] c/o emesis, diarrhea, and generalized weakness x 3 days . In ED found to be in renal failure with elevated lipase. Transferred to [facility] for ICU admission and PCCM eval.    STUDIES:  8/19 CT abd/pelvis - Distended stomach with interstitial fluid changes, cannot exclude gastric outlet obstruction. Mesenteric edema. Abdominal/pelvic fluid stable from prior exam. Body wall edema. No hydronephrosis 8/20 [place] abdo>>>Visualized pancreas has a normal sonographic appearance. No biliary sludge or gallstones. Upper limits of normal to mildly  dilated CBD, the similar to the abdomen ultrasound in April. No intrahepatic biliary ductal dilatation identified   SIGNIFICANT EVENTS: 8/19 admitted to ICU 8/20 ARF, poor output  SUBJECTIVE: some increase RR  VITAL SIGNS: Temp:  [94.9 F (34.9 C)-98.7 F (37.1 C)] 97.9 F (36.6 C) (08/21 0411) Pulse Rate:  [76-86] 82 (08/21 0700) Resp:  [17-29] 19 (08/21 0700) BP: (119-155)/(64-83) 139/78 mmHg (08/21 0600) SpO2:  [83 %-100 %] 94 % (08/21 0700) Weight:  [57.9 kg (127 lb 10.3 oz)] 57.9 kg (127 lb 10.3 oz) (08/21 0500) HEMODYNAMICS: CVP:  [6 mmHg-17 mmHg] 14 mmHg VENTILATOR SETTINGS:   INTAKE / OUTPUT:  Intake/Output Summary (Last 24 hours) at 03/24/14 3976 Last data filed at 03/24/14 0600  Gross per 24 hour  Intake   3260 ml  Output    136 ml  Net   3124 ml    PHYSICAL EXAMINATION: General:  Thin male, drowsy, in NAD Neuro:  Alert, oriented, more awake HEENT:  bitemporal wasting Cardiovascular:  RRR, no MRG Lungs: elevated rr s1 s2 rrr Abdomen:  Distended, tender mild epigastric, no r/g Musculoskeletal:  No acute deformity or ROM limitation. No  peripheral edema.  Skin:  Intact.   LABS:  CBC  Recent Labs Lab 03/22/14 2111 03/23/14 0300 03/24/14 0420  WBC 15.8* 15.1* 9.7  HGB 12.8* 12.4* 11.5*  HCT 35.7* 35.6* 31.9*  PLT 164 133* 74*   Coag's  Recent Labs Lab 03/23/14 0300  INR 1.56*   BMET  Recent Labs Lab 03/23/14 1020 03/23/14 1911 03/24/14 0420  NA 134* 133* 134*  K 4.5 4.3 4.0  CL 99 97 93*  CO2 9* 11* 13*  BUN 63* 67* 74*  CREATININE 6.81* 7.06* 7.50*  GLUCOSE 78 82 139*   Electrolytes  Recent Labs Lab 03/23/14 0300  03/23/14 1020 03/23/14 1911 03/24/14 0420  CALCIUM 7.6*  < > 6.5* 6.3* 6.2*  MG 2.1  --   --   --  1.7  PHOS 8.8*  --   --   --  8.9*  < > = values in this interval not displayed. Sepsis Markers  Recent Labs Lab 03/23/14 0300 03/23/14 1255 03/23/14 1911  LATICACIDVEN 5.8* 5.1* 3.8*   ABG  Recent Labs Lab 03/23/14 0352 03/23/14 1150 03/24/14 0236  PHART 7.183* 7.215* 7.343*  PCO2ART 19.7* 17.8* 22.8*  PO2ART 40.0* 74.9* 66.1*   Liver Enzymes  Recent Labs Lab 03/22/14 2111 03/22/14 2230 03/24/14 0420  AST 35 50* 42*  ALT 23 32 43  ALKPHOS 56 50 44  BILITOT 0.6 0.7 0.3  ALBUMIN 3.7 3.3* 2.3*   Cardiac Enzymes  Recent Labs Lab 03/23/14 0300 03/23/14 0826 03/23/14 1911  TROPONINI <0.30 <0.30 <0.30   Glucose  Recent Labs Lab 03/23/14 0805 03/23/14 1225 03/23/14 1559 03/23/14 1933 03/24/14 0005 03/24/14 0410  GLUCAP 79 79 95 81 95 130*    Imaging US Abdomen Complete  03/23/2014   CLINICAL DATA:  68 year old male with HIV presenting with emesis, diarrhea, weakness, acute renal failure and abnormal lipase. Initial encounter.  EXAM: ULTRASOUND ABDOMEN COMPLETE  COMPARISON:  CT Abdomen and Pelvis without contrast 03/22/2014. Abdomen ultrasound 11/18/2013.  FINDINGS: Gallbladder:  Wall thickening stable to decreased since April. No echogenic stones or sludge. No sonographic Murphy sign elicited.  Common bile duct:  Diameter: 6-8 mm diameter,  upper limits of normal to mildly dilated (was 7 mm in April).  Liver:  Increased echogenicity, stable. No discrete liver lesion. No intrahepatic ductal dilatation identified.  IVC:  Incompletely visualized due to overlying bowel gas, visualized portions within normal limits.  Pancreas:  Incompletely visualized due to overlying bowel gas, visualized portions within normal limits.  Spleen:  Size and appearance within normal limits.  Right Kidney:  Length: 11.6 cm. Mildly echogenic. No hydronephrosis or renal mass.  Left Kidney:  Length: 11.6 cm. Mildly echogenic. No hydronephrosis or left renal mass.  Abdominal aorta:  Incompletely visualized due to overlying bowel gas, visualized portions within normal limits.  Other findings:  No ascites identified.  IMPRESSION: 1. Visualized pancreas has a normal sonographic appearance. 2. No biliary sludge or gallstones. Upper limits of normal to mildly dilated CBD, the similar to the abdomen ultrasound in April. No intrahepatic biliary ductal dilatation identified. 3. Resolved small volume ascites. 4. Mildly echogenic kidneys, could reflect chronic medical renal disease or less likely developing HIV nephropathy.   Electronically Signed   By: Lars Pinks M.D.   On: 03/23/2014 16:09   Dg Chest Portable 1 View  03/23/2014   CLINICAL DATA:  Left IJ catheter placement.  EXAM: PORTABLE CHEST - 1 VIEW  COMPARISON:  PA and lateral chest 10/18/2012.  FINDINGS: A new left IJ catheter is in place with the tip in the right atrium. Recommend withdrawal of 5.5 cm. Mild interstitial edema is present. There is no pneumothorax. Heart size is upper normal.  IMPRESSION: Tip of left IJ catheter is in the right atrium. Recommend withdrawal of 5.5 cm. Negative for pneumothorax.  Interstitial edema.  These results will be called to the ordering clinician or representative by the Radiologist Assistant, and communication documented in the PACS or zVision Dashboard.   Electronically Signed   By: Inge Rise M.D.   On: 03/23/2014 13:08     ASSESSMENT / PLAN:  PULMONARY A: Acute on chronic respiratory failure COPD without evidence of exacerbation. Uncompensated met acidosis pulm edema? Vs pancreatitis induced ALI  P:   Supplemental O2 as needed to maintain SpO2 greater than 92% BD's PRN pcxr in am  Follow lactic acid Concerned about edema increase, needs echo, may need to lower volume Would NOT favor nimv, may require intubation  CARDIOVASCULAR A:  H/o HTN, DAS s/p remote CABG, hypovolemia pulm edema? P:  Hold preadmission PO medications cvp 7 , is this accurate He developed edema fast, will assess echo, i have reviewed last echo 2011 ef 55%  RENAL A:   Acute renal failure Hyperkalemia - resolved Hyponatremia (likley hypotonic, hypovolemic) - improving with saline High AG metabolic acidosis (suspect lactic, uremic) - slow progress  P:   bmet q6h, urine output 30 cc in near 10 hours, will d/w renal Appreciate renal consult bicarb drip remain ,  to 50 cc/hr Reduce total intake with pcxr findings for now  GASTROINTESTINAL A:   Severe acute pancreatitis, possibly 2nd to STRIBILD (hx of this with other ARV meds) Unclear etiology P:   NPO, maintain would prefer post pyloric feeding when able if ETT placed Reduce fluids  SUP: IV PPI PRN zofran Lip in am  abdo [place] to correlate with CT, reviewed Will consult GI for etiology  HEMATOLOGIC A:   Anemia Leukocytosis Thrombocytopenia (sirs?)  P:  Follow CBC in am  Sub q hep - dc scd  INFECTIOUS A:   SIRS/Sepsis  HIV hypothermic  P:   HARRT per ID BCx2 8/20 >>> UC 8/20 >>> Stool  mod acdi fast>>>  Cdiff>>> Imipenem 8/20>>> vanc 8/20>>> Would like to limit ABX rapidly if able with normal appearing pancrease  ENDOCRINE A:   DM  P:   CBG monitoring and SSI  NEUROLOGIC A:   Acute encephalopathy in setting of acidosis - 8/20 drowsy, easily arousable  P:   RASS goal: 0 abg repeat  imrpoved  Global: still high risk to receive HD, some lab improvemnts, some concern for ali / edema, lower volume, will consult GI  CC time 35 minutes.  Lavon Paganini. Titus Mould, MD, [place] Pgr: [facility]

## 2014-03-24 NOTE — Progress Notes (Signed)
[facility]  Date of Admission:  03/22/2014  Antibiotics: Vancomycin and imipenem  Subjective: Feels a little better, getting Echo now  Objective: Temp:  [97.4 F (36.3 C)-98.7 F (37.1 C)] 97.7 F (36.5 C) (08/21 0806) Pulse Rate:  [76-86] 82 (08/21 0700) Resp:  [17-29] 19 (08/21 0700) BP: (119-155)/(64-83) 139/78 mmHg (08/21 0600) SpO2:  [83 %-100 %] 94 % (08/21 0700) Weight:  [127 lb 10.3 oz (57.9 kg)] 127 lb 10.3 oz (57.9 kg) (08/21 0500)  General: awake, tired appearing Skin: no rashes Lungs: CTA Cor: RRR Ext: no edema  Lab Results Lab Results  Component Value Date   WBC 9.7 03/24/2014   HGB 11.5* 03/24/2014   HCT 31.9* 03/24/2014   MCV 93.8 03/24/2014   PLT 74* 03/24/2014    Lab Results  Component Value Date   CREATININE 7.50* 03/24/2014   BUN 74* 03/24/2014   NA 134* 03/24/2014   K 4.0 03/24/2014   CL 93* 03/24/2014   CO2 13* 03/24/2014    Lab Results  Component Value Date   ALT 43 03/24/2014   AST 42* 03/24/2014   ALKPHOS 44 03/24/2014   BILITOT 0.3 03/24/2014      Microbiology: Recent Results (from the past 240 hour(s))  MRSA PCR SCREENING     Status: None   Collection Time    03/23/14  1:50 AM      Result Value Ref Range Status   MRSA by PCR NEGATIVE  NEGATIVE Final   Comment:            The GeneXpert MRSA Assay (FDA     approved for NASAL specimens     only), is one component of a     comprehensive MRSA colonization     surveillance program. It is not     intended to diagnose MRSA     infection nor to guide or     monitor treatment for     MRSA infections.  URINE CULTURE     Status: None   Collection Time    03/23/14  2:56 AM      Result Value Ref Range Status   Specimen Description URINE, CATHETERIZED   Final   Special Requests NONE   Final   Culture  Setup Time     Final   Value: 03/23/2014 09:02     Performed at [facility]     Final   Value: NO GROWTH     Performed at [facility]   Culture     Final   Value: NO GROWTH     Performed at [facility]   Report Status 03/24/2014 FINAL   Final  CULTURE, BLOOD (ROUTINE X 2)     Status: None   Collection Time    03/23/14  3:00 AM      Result Value Ref Range Status   Specimen Description BLOOD LEFT ARM   Final   Special Requests BOTTLES DRAWN AEROBIC AND ANAEROBIC 10CC   Final   Culture  Setup Time     Final   Value: 03/23/2014 08:48     Performed at [facility]   Culture     Final   Value:        BLOOD CULTURE RECEIVED NO GROWTH TO DATE CULTURE WILL BE HELD FOR 5 DAYS BEFORE ISSUING A FINAL NEGATIVE REPORT     Performed at [facility]   Report Status  PENDING   Incomplete  CULTURE, BLOOD (ROUTINE X 2)     Status: None   Collection Time    03/23/14  3:06 AM      Result Value Ref Range Status   Specimen Description BLOOD RIGHT HAND   Final   Special Requests BOTTLES DRAWN AEROBIC AND ANAEROBIC 5CC   Final   Culture  Setup Time     Final   Value: 03/23/2014 08:47     Performed at [facility]   Culture     Final   Value:        BLOOD CULTURE RECEIVED NO GROWTH TO DATE CULTURE WILL BE HELD FOR 5 DAYS BEFORE ISSUING A FINAL NEGATIVE REPORT     Performed at [facility]   Report Status PENDING   Incomplete  CLOSTRIDIUM DIFFICILE BY PCR     Status: None   Collection Time    03/24/14  5:24 AM      Result Value Ref Range Status   C difficile by pcr NEGATIVE  NEGATIVE Final  CULTURE, EXPECTORATED SPUTUM-ASSESSMENT     Status: None   Collection Time    03/24/14  5:24 AM      Result Value Ref Range Status   Specimen Description SPUTUM   Final   Special Requests NONE   Final   Sputum evaluation     Final   Value: MICROSCOPIC FINDINGS SUGGEST THAT THIS SPECIMEN IS NOT REPRESENTATIVE OF LOWER RESPIRATORY SECRETIONS. PLEASE RECOLLECT.     RESULT CALLED TO, READ BACK BY AND VERIFIED WITHWilson Singer RN [id number] [phone] GREEN R   Report Status 03/24/2014 FINAL   Final     Studies/Results: Ct Abdomen Pelvis Wo Contrast  03/22/2014   CLINICAL DATA:  Nausea, vomiting and diarrhea.  EXAM: CT ABDOMEN AND PELVIS WITHOUT CONTRAST  TECHNIQUE: Multidetector CT imaging of the abdomen and pelvis was performed following the standard protocol without IV contrast.  COMPARISON:  11/15/2013  FINDINGS: The lung bases are grossly clear.  The liver is grossly normal and stable. Small calcified granulomas are noted. The gallbladder is grossly normal. No common bile duct dilatation. The pancreas is grossly normal. The spleen is normal in size. No focal lesions. The adrenal glands and kidneys are unremarkable without contrast. No hydronephrosis or obstructing ureteral calculi.  The stomach is moderately distended and there is interstitial changes around the stomach. Could not exclude partial gastric outlet obstruction. Mild wall thickening involving the duodenum but no obvious ulceration. No dilatation of the small bowel or colon. There is mesenteric edema and a small amount of abdominal and pelvic fluid which was also present on the prior study.  No mesenteric or retroperitoneal mass or adenopathy. The aorta is normal in caliber.  The bladder, prostate gland and seminal vesicles are unremarkable. No pelvic mass or adenopathy. There is a small amount of free pelvic fluid. No inguinal mass or adenopathy. Mild diffuse body wall edema.  IMPRESSION: 1. Distended stomach with surrounding interstitial change in fluid. Could not exclude a partial gastric outlet obstruction. 2. Mesenteric edema and scattered abdominal and pelvic fluid appears relatively stable when compared to prior CT scan. 3. Body wall edema.   Electronically Signed   By: Kalman Jewels M.D.   On: 03/22/2014 23:32   US Abdomen Complete  03/23/2014   CLINICAL DATA:  68 year old male with HIV presenting with emesis, diarrhea, weakness, acute renal failure and abnormal lipase. Initial encounter.  EXAM: ULTRASOUND ABDOMEN COMPLETE   COMPARISON:  CT Abdomen and Pelvis without contrast 03/22/2014. Abdomen ultrasound 11/18/2013.  FINDINGS: Gallbladder:  Wall thickening stable to decreased since April. No echogenic stones or sludge. No sonographic Murphy sign elicited.  Common bile duct:  Diameter: 6-8 mm diameter, upper limits of normal to mildly dilated (was 7 mm in April).  Liver:  Increased echogenicity, stable. No discrete liver lesion. No intrahepatic ductal dilatation identified.  IVC:  Incompletely visualized due to overlying bowel gas, visualized portions within normal limits.  Pancreas:  Incompletely visualized due to overlying bowel gas, visualized portions within normal limits.  Spleen:  Size and appearance within normal limits.  Right Kidney:  Length: 11.6 cm. Mildly echogenic. No hydronephrosis or renal mass.  Left Kidney:  Length: 11.6 cm. Mildly echogenic. No hydronephrosis or left renal mass.  Abdominal aorta:  Incompletely visualized due to overlying bowel gas, visualized portions within normal limits.  Other findings:  No ascites identified.  IMPRESSION: 1. Visualized pancreas has a normal sonographic appearance. 2. No biliary sludge or gallstones. Upper limits of normal to mildly dilated CBD, the similar to the abdomen ultrasound in April. No intrahepatic biliary ductal dilatation identified. 3. Resolved small volume ascites. 4. Mildly echogenic kidneys, could reflect chronic medical renal disease or less likely developing HIV nephropathy.   Electronically Signed   By: Lars Pinks M.D.   On: 03/23/2014 16:09   Dg Chest Port 1 View  03/24/2014   CLINICAL DATA:  Difficulty breathing  EXAM: PORTABLE CHEST - 1 VIEW  COMPARISON:  03/23/2014  FINDINGS: Unchanged cardiomegaly.  The patient is status post CABG.  Perihilar interstitial and airspace opacities persist. There are small bilateral pleural effusions.  Unchanged positioning of left IJ catheter, tip at the level of the right atrium. No pneumothorax.  IMPRESSION: 1. Unchanged  pulmonary edema with small pleural effusions. 2. Left IJ catheter, tip at the right atrium.   Electronically Signed   By: Jorje Guild M.D.   On: 03/24/2014 05:27   Dg Chest Portable 1 View  03/23/2014   CLINICAL DATA:  Left IJ catheter placement.  EXAM: PORTABLE CHEST - 1 VIEW  COMPARISON:  PA and lateral chest 10/18/2012.  FINDINGS: A new left IJ catheter is in place with the tip in the right atrium. Recommend withdrawal of 5.5 cm. Mild interstitial edema is present. There is no pneumothorax. Heart size is upper normal.  IMPRESSION: Tip of left IJ catheter is in the right atrium. Recommend withdrawal of 5.5 cm. Negative for pneumothorax.  Interstitial edema.  These results will be called to the ordering clinician or representative by the Radiologist Assistant, and communication documented in the PACS or zVision Dashboard.   Electronically Signed   By: Inge Rise M.D.   On: 03/23/2014 13:08   Dg Abd Portable 1v  03/24/2014   CLINICAL DATA:  Abdominal pain  EXAM: PORTABLE ABDOMEN - 1 VIEW  COMPARISON:  Abdominal CT 03/22/2014  FINDINGS: There is diffuse mild gaseous distension of small bowel, measuring up to 3.5 cm diameter. No definite wall thickening. There is a relative paucity of colonic gas, although transverse colon gas is present. Lung bases are clear. No concerning intra-abdominal mass effect.  IMPRESSION: Gaseous distention of small bowel. Given no obstruction present on recent abdominal CT, this likely reflects mild ileus or enteritis.   Electronically Signed   By: Jorje Guild M.D.   On: 03/24/2014 05:35    Assessment/Plan: 1) Pancreatitis - unknown etiology.  Has had regimen changed after last episode and now with  a second episode.  ARV medications less likely culprit.  Lipase improving.   ?HCTZ Other etiologies? I will consider non NRTI-based regimens depending on renal function/improvement I will d/c antibiotics as no issues noted on CT (no necrosis).  2) diarrhea - C diff  negative  3) renal failure - hopefully will improve with volume.  Making some urine.   Dr. Megan Salon will follow up over the weekend. thanks  Scharlene Gloss, [facility] [place] [email or web address] [phone] pager   [phone] cell 03/24/2014, 9:40 AM

## 2014-03-25 ENCOUNTER — Inpatient Hospital Stay (HOSPITAL_COMMUNITY): Payer: Medicare Other

## 2014-03-25 DIAGNOSIS — R197 Diarrhea, unspecified: Secondary | ICD-10-CM

## 2014-03-25 DIAGNOSIS — K859 Acute pancreatitis without necrosis or infection, unspecified: Secondary | ICD-10-CM

## 2014-03-25 LAB — COMPREHENSIVE METABOLIC PANEL
ALT: 31 U/L (ref 0–53)
AST: 31 U/L (ref 0–37)
Albumin: 2.4 g/dL — ABNORMAL LOW (ref 3.5–5.2)
Alkaline Phosphatase: 54 U/L (ref 39–117)
Anion gap: 31 — ABNORMAL HIGH (ref 5–15)
BILIRUBIN TOTAL: 0.5 mg/dL (ref 0.3–1.2)
BUN: 39 mg/dL — ABNORMAL HIGH (ref 6–23)
CHLORIDE: 92 meq/L — AB (ref 96–112)
CO2: 16 meq/L — AB (ref 19–32)
Calcium: 7.2 mg/dL — ABNORMAL LOW (ref 8.4–10.5)
Creatinine, Ser: 4.68 mg/dL — ABNORMAL HIGH (ref 0.50–1.35)
GFR calc Af Amer: 14 mL/min — ABNORMAL LOW (ref >90)
GFR, EST NON AFRICAN AMERICAN: 12 mL/min — AB (ref >90)
Glucose, Bld: 146 mg/dL — ABNORMAL HIGH (ref 70–99)
Potassium: 3.7 mEq/L (ref 3.7–5.3)
SODIUM: 139 meq/L (ref 137–147)
Total Protein: 5.3 g/dL — ABNORMAL LOW (ref 6.0–8.3)

## 2014-03-25 LAB — BLOOD GAS, ARTERIAL
ACID-BASE DEFICIT: 8 mmol/L — AB (ref 0.0–2.0)
Bicarbonate: 15.2 mEq/L — ABNORMAL LOW (ref 20.0–24.0)
Drawn by: 28340
FIO2: 1 %
O2 SAT: 97.9 %
PO2 ART: 101 mmHg — AB (ref 80.0–100.0)
Patient temperature: 98.6
TCO2: 15.9 mmol/L (ref 0–100)
pCO2 arterial: 21.9 mmHg — ABNORMAL LOW (ref 35.0–45.0)
pH, Arterial: 7.456 — ABNORMAL HIGH (ref 7.350–7.450)

## 2014-03-25 LAB — CBC WITH DIFFERENTIAL/PLATELET
BASOS ABS: 0 10*3/uL (ref 0.0–0.1)
Basophils Relative: 0 % (ref 0–1)
EOS ABS: 0 10*3/uL (ref 0.0–0.7)
EOS PCT: 0 % (ref 0–5)
HCT: 29.8 % — ABNORMAL LOW (ref 39.0–52.0)
Hemoglobin: 11 g/dL — ABNORMAL LOW (ref 13.0–17.0)
Lymphocytes Relative: 3 % — ABNORMAL LOW (ref 12–46)
Lymphs Abs: 0.3 10*3/uL — ABNORMAL LOW (ref 0.7–4.0)
MCH: 34.3 pg — AB (ref 26.0–34.0)
MCHC: 36.9 g/dL — ABNORMAL HIGH (ref 30.0–36.0)
MCV: 92.8 fL (ref 78.0–100.0)
Monocytes Absolute: 0.7 10*3/uL (ref 0.1–1.0)
Monocytes Relative: 7 % (ref 3–12)
Neutro Abs: 8.9 10*3/uL — ABNORMAL HIGH (ref 1.7–7.7)
Neutrophils Relative %: 90 % — ABNORMAL HIGH (ref 43–77)
Platelets: 89 10*3/uL — ABNORMAL LOW (ref 150–400)
RBC: 3.21 MIL/uL — ABNORMAL LOW (ref 4.22–5.81)
RDW: 13.4 % (ref 11.5–15.5)
WBC: 9.9 10*3/uL (ref 4.0–10.5)

## 2014-03-25 LAB — BASIC METABOLIC PANEL
ANION GAP: 27 — AB (ref 5–15)
BUN: 47 mg/dL — ABNORMAL HIGH (ref 6–23)
CHLORIDE: 93 meq/L — AB (ref 96–112)
CO2: 19 meq/L (ref 19–32)
CREATININE: 5.48 mg/dL — AB (ref 0.50–1.35)
Calcium: 7.1 mg/dL — ABNORMAL LOW (ref 8.4–10.5)
GFR calc Af Amer: 11 mL/min — ABNORMAL LOW (ref >90)
GFR calc non Af Amer: 10 mL/min — ABNORMAL LOW (ref >90)
Glucose, Bld: 88 mg/dL (ref 70–99)
POTASSIUM: 3.9 meq/L (ref 3.7–5.3)
Sodium: 139 mEq/L (ref 137–147)

## 2014-03-25 LAB — GLUCOSE, CAPILLARY
GLUCOSE-CAPILLARY: 137 mg/dL — AB (ref 70–99)
GLUCOSE-CAPILLARY: 102 mg/dL — AB (ref 70–99)
Glucose-Capillary: 109 mg/dL — ABNORMAL HIGH (ref 70–99)
Glucose-Capillary: 85 mg/dL (ref 70–99)
Glucose-Capillary: 95 mg/dL (ref 70–99)
Glucose-Capillary: 149 mg/dL — ABNORMAL HIGH (ref 70–99)

## 2014-03-25 LAB — HEPATITIS B CORE ANTIBODY, TOTAL: Hep B Core Total Ab: REACTIVE — AB

## 2014-03-25 LAB — HEPATITIS B SURFACE ANTIBODY,QUALITATIVE: Hep B S Ab: POSITIVE — AB

## 2014-03-25 LAB — LIPASE, BLOOD: Lipase: 250 U/L — ABNORMAL HIGH (ref 11–59)

## 2014-03-25 LAB — HEPATITIS B SURFACE ANTIGEN: HEP B S AG: NEGATIVE

## 2014-03-25 NOTE — Progress Notes (Signed)
PULMONARY / CRITICAL CARE MEDICINE   Name: [name] MRN: [id number] DOB: 11-17-1945    ADMISSION DATE:  03/22/2014 CONSULTATION DATE:  03/23/2014  REFERRING MD :  [place]:  Emesis  INITIAL PRESENTATION: 68 year old male with HIV on STRIBILD presented to [facility] c/o emesis, diarrhea, and generalized weakness x 3 days . In ED found to be in renal failure with elevated lipase. Transferred to [facility] for ICU admission and PCCM eval.    STUDIES:  8/19 CT abd/pelvis - Distended stomach with interstitial fluid changes, cannot exclude gastric outlet obstruction. Mesenteric edema. Abdominal/pelvic fluid stable from prior exam. Body wall edema. No hydronephrosis 8/20 [place] abdo>>>Visualized pancreas has a normal sonographic appearance. No biliary sludge or gallstones. Upper limits of normal to mildly dilated CBD, the similar to the abdomen ultrasound in April. No intrahepatic biliary ductal dilatation identified 8/21 TTE > Mild LVH, EF 60%, PAP 78mHg   SIGNIFICANT EVENTS: 8/19 admitted to ICU 8/20 ARF, poor output  SUBJECTIVE:  Breathing somewhat improved   VITAL SIGNS: Temp:  [97.3 F (36.3 C)-98.5 F (36.9 C)] 98.1 F (36.7 C) (08/22 1141) Pulse Rate:  [74-104] 80 (08/22 1430) Resp:  [17-30] 19 (08/22 1430) BP: (82-167)/(55-87) 127/81 mmHg (08/22 1430) SpO2:  [90 %-99 %] 95 % (08/22 1430) FiO2 (%):  [55 %] 55 % (08/22 0128) Weight:  [56.1 kg (123 lb 10.9 oz)-60.1 kg (132 lb 7.9 oz)] 57.9 kg (127 lb 10.3 oz) (08/22 1320) HEMODYNAMICS: CVP:  [6 mmHg-23 mmHg] 6 mmHg VENTILATOR SETTINGS: Vent Mode:  [-]  FiO2 (%):  [55 %] 55 % INTAKE / OUTPUT:  Intake/Output Summary (Last 24 hours) at 03/25/14 1444 Last data filed at 03/25/14 1400  Gross per 24 hour  Intake    942 ml  Output   4410 ml  Net  -3468 ml    PHYSICAL EXAMINATION: General:  Thin male, drowsy, in NAD Neuro:  Alert, oriented, more awake HEENT:  bitemporal wasting Cardiovascular:  RRR, no  MRG Lungs: elevated rr s1 s2 rrr Abdomen:  Distended, tender mild epigastric, no r/g Musculoskeletal:  No acute deformity or ROM limitation. No peripheral edema.  Skin:  Intact.   LABS:  CBC  Recent Labs Lab 03/23/14 0300 03/24/14 0420 03/25/14 0411  WBC 15.1* 9.7 9.9  HGB 12.4* 11.5* 11.0*  HCT 35.6* 31.9* 29.8*  PLT 133* 74* 89*   Coag's  Recent Labs Lab 03/23/14 0300  INR 1.56*   BMET  Recent Labs Lab 03/24/14 2220 03/25/14 0411 03/25/14 1020  NA 137 139 139  K 4.1 3.7 3.9  CL 90* 92* 93*  CO2 14* 16* 19  BUN 83* 39* 47*  CREATININE 8.10* 4.68* 5.48*  GLUCOSE 125* 146* 88   Electrolytes  Recent Labs Lab 03/23/14 0300  03/24/14 0420  03/24/14 2220 03/25/14 0411 03/25/14 1020  CALCIUM 7.6*  < > 6.2*  < > 6.3* 7.2* 7.1*  MG 2.1  --  1.7  --   --   --   --   PHOS 8.8*  --  8.9*  --   --   --   --   < > = values in this interval not displayed. Sepsis Markers  Recent Labs Lab 03/23/14 0300 03/23/14 1255 03/23/14 1911  LATICACIDVEN 5.8* 5.1* 3.8*   ABG  Recent Labs Lab 03/23/14 1150 03/24/14 0236 03/25/14 0256  PHART 7.215* 7.343* 7.456*  PCO2ART 17.8* 22.8* 21.9*  PO2ART 74.9* 66.1* 101.0*  Liver Enzymes  Recent Labs Lab 03/22/14 2230 03/24/14 0420 03/25/14 0411  AST 50* 42* 31  ALT 32 43 31  ALKPHOS 50 44 54  BILITOT 0.7 0.3 0.5  ALBUMIN 3.3* 2.3* 2.4*   Cardiac Enzymes  Recent Labs Lab 03/23/14 0300 03/23/14 0826 03/23/14 1911  TROPONINI <0.30 <0.30 <0.30   Glucose  Recent Labs Lab 03/24/14 1549 03/24/14 2012 03/24/14 2322 03/25/14 0401 03/25/14 0734 03/25/14 1115  GLUCAP 81 103* 109* 137* 85 95    Imaging Dg Chest Port 1 View  03/24/2014   CLINICAL DATA:  Difficulty breathing  EXAM: PORTABLE CHEST - 1 VIEW  COMPARISON:  03/23/2014  FINDINGS: Unchanged cardiomegaly.  The patient is status post CABG.  Perihilar interstitial and airspace opacities persist. There are small bilateral pleural effusions.   Unchanged positioning of left IJ catheter, tip at the level of the right atrium. No pneumothorax.  IMPRESSION: 1. Unchanged pulmonary edema with small pleural effusions. 2. Left IJ catheter, tip at the right atrium.   Electronically Signed   By: Jorje Guild M.D.   On: 03/24/2014 05:27   Dg Abd Portable 1v  03/24/2014   CLINICAL DATA:  Abdominal pain  EXAM: PORTABLE ABDOMEN - 1 VIEW  COMPARISON:  Abdominal CT 03/22/2014  FINDINGS: There is diffuse mild gaseous distension of small bowel, measuring up to 3.5 cm diameter. No definite wall thickening. There is a relative paucity of colonic gas, although transverse colon gas is present. Lung bases are clear. No concerning intra-abdominal mass effect.  IMPRESSION: Gaseous distention of small bowel. Given no obstruction present on recent abdominal CT, this likely reflects mild ileus or enteritis.   Electronically Signed   By: Jorje Guild M.D.   On: 03/24/2014 05:35     ASSESSMENT / PLAN:  PULMONARY A: Acute on chronic respiratory failure COPD without evidence of exacerbation. Uncompensated met acidosis pulm edema? Vs pancreatitis induced ALI  P:   Supplemental O2 as needed to maintain SpO2 greater than 92% BD's PRN pcxr in am  Continue volume removal per HD / UF  CARDIOVASCULAR A:  H/o HTN, DAS s/p remote CABG, hypovolemia pulm edema? > appears to be improving P:  Hold preadmission PO medications  RENAL A:   Acute renal failure, oliguric Hyperkalemia - resolved Hyponatremia (likley hypotonic, hypovolemic) - improving with saline High AG metabolic acidosis (suspect lactic, uremic) - slow progress P:   Change BMP to qd D/c bicarb 8/21 pm  GASTROINTESTINAL A:   Severe acute pancreatitis, possibly 2nd to STRIBILD (hx of this with other ARV meds) Unclear etiology, abd [place] > no obvious pancreatic abnormality, similar dilated CBD compared with 4/'15 P:   NPO except ice chips / clears SUP: IV PPI PRN zofran Follow  lipase Appreciate GI input, he may merit MRCP in the future  HEMATOLOGIC A:   Anemia Leukocytosis Thrombocytopenia (sirs?) P:  Follow CBC in am  scd  INFECTIOUS A:   SIRS/Sepsis  HIV hypothermic  P:   HARRT on hold for now BCx2 8/20 >>> UC 8/20 >>> Stool  mod acid fast>>>negative  Cdiff>>> negative Imipenem 8/20>>> 8/21 vanc 8/20>>> 8/21   Stop all abx and follow  ENDOCRINE A:   DM P:   CBG monitoring and SSI  NEUROLOGIC A:   Acute encephalopathy in setting of acidosis - improving P:   RASS goal: 0    CC time 35 minutes.  Baltazar Apo, MD, PhD 03/25/2014, 3:07 PM [facility] [phone] or if no answer  [phone]

## 2014-03-25 NOTE — Progress Notes (Signed)
[name] Progress Note  Subjective:  Minimal nausea, no vomiting.  Abdominal pain is improved.  Has rectal tube in place.  Had HD yesterday.  Objective:  Vital signs in last 24 hours: Temp:  [97 F (36.1 C)-98.5 F (36.9 C)] 97.9 F (36.6 C) (08/22 0801) Pulse Rate:  [74-104] 74 (08/22 1000) Resp:  [17-30] 20 (08/22 1000) BP: (82-167)/(55-87) 136/68 mmHg (08/22 1000) SpO2:  [87 %-99 %] 95 % (08/22 1000) FiO2 (%):  [55 %] 55 % (08/22 0128) Weight:  [123 lb 10.9 oz (56.1 kg)-132 lb 7.9 oz (60.1 kg)] 123 lb 10.9 oz (56.1 kg) (08/22 0128) Last BM Date: 03/25/14 General:  Alert, chronically ill-appearing, in NAD Heart:  Regular rate and rhythm; no murmurs Pulm:  Coarse BS noted. Abdomen:  Soft, non-distended.  BS present.  Minimal TTP.   Extremities:  Without edema. Neurologic:  Alert and  oriented x4;  grossly normal neurologically. Psych:  Alert and cooperative. Normal mood and affect.  Intake/Output from previous day: 08/21 0701 - 08/22 0700 In: 1357 [I.V.:1265; IV Piggyback:62] Out: 6226 [Urine:440; Stool:100] Intake/Output this shift: Total I/O In: 30 [I.V.:30] Out: -   Lab Results:  Recent Labs  03/23/14 0300 03/24/14 0420 03/25/14 0411  WBC 15.1* 9.7 9.9  HGB 12.4* 11.5* 11.0*  HCT 35.6* 31.9* 29.8*  PLT 133* 74* 89*   BMET  Recent Labs  03/24/14 1600 03/24/14 2220 03/25/14 0411  NA 135* 137 139  K 3.9 4.1 3.7  CL 92* 90* 92*  CO2 16* 14* 16*  GLUCOSE 80 125* 146*  BUN 77* 83* 39*  CREATININE 7.65* 8.10* 4.68*  CALCIUM 6.5* 6.3* 7.2*   LFT  Recent Labs  03/25/14 0411  PROT 5.3*  ALBUMIN 2.4*  AST 31  ALT 31  ALKPHOS 54  BILITOT 0.5   PT/INR  Recent Labs  03/23/14 0300  LABPROT 18.7*  INR 1.56*   Hepatitis Panel  Recent Labs  03/24/14 2230  HEPBSAG NEGATIVE    US Abdomen Complete  03/23/2014   CLINICAL DATA:  68 year old male with HIV presenting with emesis, diarrhea, weakness, acute renal failure and  abnormal lipase. Initial encounter.  EXAM: ULTRASOUND ABDOMEN COMPLETE  COMPARISON:  CT Abdomen and Pelvis without contrast 03/22/2014. Abdomen ultrasound 11/18/2013.  FINDINGS: Gallbladder:  Wall thickening stable to decreased since April. No echogenic stones or sludge. No sonographic Murphy sign elicited.  Common bile duct:  Diameter: 6-8 mm diameter, upper limits of normal to mildly dilated (was 7 mm in April).  Liver:  Increased echogenicity, stable. No discrete liver lesion. No intrahepatic ductal dilatation identified.  IVC:  Incompletely visualized due to overlying bowel gas, visualized portions within normal limits.  Pancreas:  Incompletely visualized due to overlying bowel gas, visualized portions within normal limits.  Spleen:  Size and appearance within normal limits.  Right Kidney:  Length: 11.6 cm. Mildly echogenic. No hydronephrosis or renal mass.  Left Kidney:  Length: 11.6 cm. Mildly echogenic. No hydronephrosis or left renal mass.  Abdominal aorta:  Incompletely visualized due to overlying bowel gas, visualized portions within normal limits.  Other findings:  No ascites identified.  IMPRESSION: 1. Visualized pancreas has a normal sonographic appearance. 2. No biliary sludge or gallstones. Upper limits of normal to mildly dilated CBD, the similar to the abdomen ultrasound in April. No intrahepatic biliary ductal dilatation identified. 3. Resolved small volume ascites. 4. Mildly echogenic kidneys, could reflect chronic medical renal disease or less likely developing HIV nephropathy.  Electronically Signed   By: Lars Pinks M.D.   On: 03/23/2014 16:09   Dg Chest Port 1 View  03/25/2014   CLINICAL DATA:  Respiratory failure  EXAM: PORTABLE CHEST - 1 VIEW  COMPARISON:  Prior chest x-ray 03/24/2014  FINDINGS: Fluctuating patchy bilateral airspace opacities. Slightly increased confluence of opacity in the left retrocardiac and left upper lung regions. The right upper and lower lobe opacities are similar.  There are small bilateral layering pleural effusions. No pneumothorax. Stable borderline cardiomegaly. Patient is status post median sternotomy with evidence of prior multivessel CABG. Left IJ approach central venous catheter. Catheter tip in good position at the superior cavoatrial junction. Inspiratory volumes are low. No acute osseous abnormality.  IMPRESSION: 1. Asymmetric bilateral interstitial and airspace disease. Differential considerations include asymmetric pulmonary edema and multifocal pneumonia/pneumonitis. The asymmetry of the opacities is more conspicuous on today's examination with worse airspace disease on the left compared to the right. 2. Probable small bilateral layering pleural effusions.   Electronically Signed   By: Jacqulynn Cadet M.D.   On: 03/25/2014 08:21   Dg Chest Port 1 View  03/24/2014   CLINICAL DATA:  Difficulty breathing  EXAM: PORTABLE CHEST - 1 VIEW  COMPARISON:  03/23/2014  FINDINGS: Unchanged cardiomegaly.  The patient is status post CABG.  Perihilar interstitial and airspace opacities persist. There are small bilateral pleural effusions.  Unchanged positioning of left IJ catheter, tip at the level of the right atrium. No pneumothorax.  IMPRESSION: 1. Unchanged pulmonary edema with small pleural effusions. 2. Left IJ catheter, tip at the right atrium.   Electronically Signed   By: Jorje Guild M.D.   On: 03/24/2014 05:27   Dg Chest Portable 1 View  03/23/2014   CLINICAL DATA:  Left IJ catheter placement.  EXAM: PORTABLE CHEST - 1 VIEW  COMPARISON:  PA and lateral chest 10/18/2012.  FINDINGS: A new left IJ catheter is in place with the tip in the right atrium. Recommend withdrawal of 5.5 cm. Mild interstitial edema is present. There is no pneumothorax. Heart size is upper normal.  IMPRESSION: Tip of left IJ catheter is in the right atrium. Recommend withdrawal of 5.5 cm. Negative for pneumothorax.  Interstitial edema.  These results will be called to the ordering  clinician or representative by the Radiologist Assistant, and communication documented in the PACS or zVision Dashboard.   Electronically Signed   By: Inge Rise M.D.   On: 03/23/2014 13:08   Dg Abd Portable 1v  03/24/2014   CLINICAL DATA:  Abdominal pain  EXAM: PORTABLE ABDOMEN - 1 VIEW  COMPARISON:  Abdominal CT 03/22/2014  FINDINGS: There is diffuse mild gaseous distension of small bowel, measuring up to 3.5 cm diameter. No definite wall thickening. There is a relative paucity of colonic gas, although transverse colon gas is present. Lung bases are clear. No concerning intra-abdominal mass effect.  IMPRESSION: Gaseous distention of small bowel. Given no obstruction present on recent abdominal CT, this likely reflects mild ileus or enteritis.   Electronically Signed   By: Jorje Guild M.D.   On: 03/24/2014 05:35    Assessment / Plan: * Recurrent pancreatitis. Interesting that Lipase is elevated (much improved today), but pancreas imaging normal.  In April had gallbladder sludge but normal HIDA. Current CT scan with no pancreatitis but interstitial changes in stomach and thickened SB with possible GOO. Current Ultrasound reading with prominent 6 - 8 mm CBD. Previous fatty liver has resolved.  Continue supportive care with  bowel rest and hydration.  * SB ileus. May need enteroscopy to evaluate given thickened duodenum and chronic diarrhea in HIV pt to rule out neoplasia/infection.  Continue supportive care with bowel rest and hydration. * Chronic diarrhea for at least 6 months. C diff and stool pathogen panels negative starting in 11/2013.  Fecal lactoferrin is positive.  Colonoscopy 07/2012 with tubular adenomatous polyp and hemorrhoids.  Has rectal tube in place.  CT with slight duodenal thickening but normal colonoscopy.  * ARF. Underwent HD on 8/21.  Renal following.  * Acute resp failure.  * HIV. Compliant with HAART.  Combivir and Viramune stopped in 11/2013 when felt to be cause of  pancreatitis. Stribild initiated in mid 11/2013.   -Consider MRCP and EGD when respiratory status and renal function allow.    LOS: 3 days   ZEHR, JESSICA D.  03/25/2014, 10:48 AM  Pager number [phone]     Attending physician's note   I have taken an interval history, reviewed the chart and examined the patient. I agree with the Advanced Practitioner's note, impression and recommendations. Lipase has decreased from 2024 yesterday to 250 today. Resp status improved after HD last evening. Continue supportive mgmt for pancreatitis and suspected ileus. Consider MRCP and EGD when respiratory status and renal function allow.  Pricilla Riffle. Fuller Plan, MD [facility]

## 2014-03-25 NOTE — Progress Notes (Signed)
Patient ID: [name], male   DOB: 04/15/1946, 68 y.o.   MRN: [id number]         [facility] for Infectious Disease    Date of Admission:  03/22/2014     Active Problems:   AKI (acute kidney injury)   Metabolic acidosis   Hyperkalemia   Hyponatremia   HIV disease   Acute gastroenteritis   Protein-calorie malnutrition, severe   Acute kidney injury   . antiseptic oral rinse  7 mL Mouth Rinse q12n4p  . chlorhexidine  15 mL Mouth Rinse BID  . insulin aspart  2-6 Units Subcutaneous 6 times per day  . pantoprazole (PROTONIX) IV  40 mg Intravenous QHS    Subjective: He is feeling better today. He denies pain.  Past Medical History  Diagnosis Date  . Arthritis   . Diabetes mellitus   . History of depression   . GERD (gastroesophageal reflux disease)   . CAD (coronary artery disease)     2003- cabg  . Hypertension   . Hyperlipidemia   . History of kidney stones   . COPD (chronic obstructive pulmonary disease)   . HIV (human immunodeficiency virus infection) 1991    on meds since initial dx.   . Dermatitis 11/27/2012  . Nocturia 03/06/2013  . Epicondylitis 06/05/2013    right  . Pancreatitis 11/2013    attributed to HIV meds.     History  Substance Use Topics  . Smoking status: Never Smoker   . Smokeless tobacco: Never Used  . Alcohol Use: No    Family History  Problem Relation Age of Onset  . Arthritis      mother/father/paternal grandparents  . Breast cancer Maternal Aunt     paternal aunt  . Lung cancer Maternal Aunt   . Hyperlipidemia Mother   . Hyperlipidemia Father   . Heart disease      parents/maternal grandparents/ 2 brothers  . Stroke Paternal Grandmother   . Hypertension Father     paternal grandmother/3 brothers/1 sister  . Mental retardation Sister   . Diabetes Mother     paternal grandparents/1 brother    No Known Allergies  Objective: Temp:  [97.3 F (36.3 C)-98.5 F (36.9 C)] 98.1 F (36.7 C) (08/22 1141) Pulse Rate:   [74-104] 76 (08/22 1200) Resp:  [17-30] 20 (08/22 1200) BP: (82-167)/(55-87) 135/64 mmHg (08/22 1200) SpO2:  [90 %-99 %] 94 % (08/22 1200) FiO2 (%):  [55 %] 55 % (08/22 0128) Weight:  [123 lb 10.9 oz (56.1 kg)-132 lb 7.9 oz (60.1 kg)] 123 lb 10.9 oz (56.1 kg) (08/22 0128)  General: Alert and in no distress Skin: No rash Lungs: Few crackles Cor: Regular S1 and S2 with no murmurs Abdomen: Soft and nontender   Lab Results Lab Results  Component Value Date   WBC 9.9 03/25/2014   HGB 11.0* 03/25/2014   HCT 29.8* 03/25/2014   MCV 92.8 03/25/2014   PLT 89* 03/25/2014    Lab Results  Component Value Date   CREATININE 5.48* 03/25/2014   BUN 47* 03/25/2014   NA 139 03/25/2014   K 3.9 03/25/2014   CL 93* 03/25/2014   CO2 19 03/25/2014    Lab Results  Component Value Date   ALT 31 03/25/2014   AST 31 03/25/2014   ALKPHOS 54 03/25/2014   BILITOT 0.5 03/25/2014      Microbiology: Recent Results (from the past 240 hour(s))  MRSA PCR SCREENING     Status: None  Collection Time    03/23/14  1:50 AM      Result Value Ref Range Status   MRSA by PCR NEGATIVE  NEGATIVE Final   Comment:            The GeneXpert MRSA Assay (FDA     approved for NASAL specimens     only), is one component of a     comprehensive MRSA colonization     surveillance program. It is not     intended to diagnose MRSA     infection nor to guide or     monitor treatment for     MRSA infections.  URINE CULTURE     Status: None   Collection Time    03/23/14  2:56 AM      Result Value Ref Range Status   Specimen Description URINE, CATHETERIZED   Final   Special Requests NONE   Final   Culture  Setup Time     Final   Value: 03/23/2014 09:02     Performed at [facility]     Final   Value: NO GROWTH     Performed at [facility]   Culture     Final   Value: NO GROWTH     Performed at [facility]   Report Status 03/24/2014 FINAL   Final  CULTURE, BLOOD (ROUTINE X 2)      Status: None   Collection Time    03/23/14  3:00 AM      Result Value Ref Range Status   Specimen Description BLOOD LEFT ARM   Final   Special Requests BOTTLES DRAWN AEROBIC AND ANAEROBIC 10CC   Final   Culture  Setup Time     Final   Value: 03/23/2014 08:48     Performed at [facility]   Culture     Final   Value:        BLOOD CULTURE RECEIVED NO GROWTH TO DATE CULTURE WILL BE HELD FOR 5 DAYS BEFORE ISSUING A FINAL NEGATIVE REPORT     Performed at [facility]   Report Status PENDING   Incomplete  CULTURE, BLOOD (ROUTINE X 2)     Status: None   Collection Time    03/23/14  3:06 AM      Result Value Ref Range Status   Specimen Description BLOOD RIGHT HAND   Final   Special Requests BOTTLES DRAWN AEROBIC AND ANAEROBIC 5CC   Final   Culture  Setup Time     Final   Value: 03/23/2014 08:47     Performed at [facility]   Culture     Final   Value:        BLOOD CULTURE RECEIVED NO GROWTH TO DATE CULTURE WILL BE HELD FOR 5 DAYS BEFORE ISSUING A FINAL NEGATIVE REPORT     Performed at [facility]   Report Status PENDING   Incomplete  AFB CULTURE WITH SMEAR     Status: None   Collection Time    03/23/14 12:00 PM      Result Value Ref Range Status   Specimen Description STOOL   Final   Special Requests NONE   Final   Acid Fast Smear     Final   Value: NO ACID FAST BACILLI SEEN     Performed at [facility]   Culture     Final   Value: CULTURE WILL BE EXAMINED FOR  6 WEEKS BEFORE ISSUING A FINAL REPORT     Performed at [facility]   Report Status PENDING   Incomplete  CLOSTRIDIUM DIFFICILE BY PCR     Status: None   Collection Time    03/24/14  5:24 AM      Result Value Ref Range Status   C difficile by pcr NEGATIVE  NEGATIVE Final  CULTURE, EXPECTORATED SPUTUM-ASSESSMENT     Status: None   Collection Time    03/24/14  5:24 AM      Result Value Ref Range Status   Specimen Description SPUTUM   Final   Special Requests NONE   Final     Sputum evaluation     Final   Value: MICROSCOPIC FINDINGS SUGGEST THAT THIS SPECIMEN IS NOT REPRESENTATIVE OF LOWER RESPIRATORY SECRETIONS. PLEASE RECOLLECT.     RESULT CALLED TO, READ BACK BY AND VERIFIED WITHWilson Singer RN [phone] GREEN R   Report Status 03/24/2014 FINAL   Final    Studies/Results: US Abdomen Complete  03/23/2014   CLINICAL DATA:  68 year old male with HIV presenting with emesis, diarrhea, weakness, acute renal failure and abnormal lipase. Initial encounter.  EXAM: ULTRASOUND ABDOMEN COMPLETE  COMPARISON:  CT Abdomen and Pelvis without contrast 03/22/2014. Abdomen ultrasound 11/18/2013.  FINDINGS: Gallbladder:  Wall thickening stable to decreased since April. No echogenic stones or sludge. No sonographic Murphy sign elicited.  Common bile duct:  Diameter: 6-8 mm diameter, upper limits of normal to mildly dilated (was 7 mm in April).  Liver:  Increased echogenicity, stable. No discrete liver lesion. No intrahepatic ductal dilatation identified.  IVC:  Incompletely visualized due to overlying bowel gas, visualized portions within normal limits.  Pancreas:  Incompletely visualized due to overlying bowel gas, visualized portions within normal limits.  Spleen:  Size and appearance within normal limits.  Right Kidney:  Length: 11.6 cm. Mildly echogenic. No hydronephrosis or renal mass.  Left Kidney:  Length: 11.6 cm. Mildly echogenic. No hydronephrosis or left renal mass.  Abdominal aorta:  Incompletely visualized due to overlying bowel gas, visualized portions within normal limits.  Other findings:  No ascites identified.  IMPRESSION: 1. Visualized pancreas has a normal sonographic appearance. 2. No biliary sludge or gallstones. Upper limits of normal to mildly dilated CBD, the similar to the abdomen ultrasound in April. No intrahepatic biliary ductal dilatation identified. 3. Resolved small volume ascites. 4. Mildly echogenic kidneys, could reflect chronic medical renal disease or  less likely developing HIV nephropathy.   Electronically Signed   By: Lars Pinks M.D.   On: 03/23/2014 16:09   Dg Chest Port 1 View  03/25/2014   CLINICAL DATA:  Respiratory failure  EXAM: PORTABLE CHEST - 1 VIEW  COMPARISON:  Prior chest x-ray 03/24/2014  FINDINGS: Fluctuating patchy bilateral airspace opacities. Slightly increased confluence of opacity in the left retrocardiac and left upper lung regions. The right upper and lower lobe opacities are similar. There are small bilateral layering pleural effusions. No pneumothorax. Stable borderline cardiomegaly. Patient is status post median sternotomy with evidence of prior multivessel CABG. Left IJ approach central venous catheter. Catheter tip in good position at the superior cavoatrial junction. Inspiratory volumes are low. No acute osseous abnormality.  IMPRESSION: 1. Asymmetric bilateral interstitial and airspace disease. Differential considerations include asymmetric pulmonary edema and multifocal pneumonia/pneumonitis. The asymmetry of the opacities is more conspicuous on today's examination with worse airspace disease on the left compared to the right. 2. Probable small bilateral layering pleural effusions.  Electronically Signed   By: Jacqulynn Cadet M.D.   On: 03/25/2014 08:21   Dg Chest Port 1 View  03/24/2014   CLINICAL DATA:  Difficulty breathing  EXAM: PORTABLE CHEST - 1 VIEW  COMPARISON:  03/23/2014  FINDINGS: Unchanged cardiomegaly.  The patient is status post CABG.  Perihilar interstitial and airspace opacities persist. There are small bilateral pleural effusions.  Unchanged positioning of left IJ catheter, tip at the level of the right atrium. No pneumothorax.  IMPRESSION: 1. Unchanged pulmonary edema with small pleural effusions. 2. Left IJ catheter, tip at the right atrium.   Electronically Signed   By: Jorje Guild M.D.   On: 03/24/2014 05:27   Dg Abd Portable 1v  03/24/2014   CLINICAL DATA:  Abdominal pain  EXAM: PORTABLE ABDOMEN  - 1 VIEW  COMPARISON:  Abdominal CT 03/22/2014  FINDINGS: There is diffuse mild gaseous distension of small bowel, measuring up to 3.5 cm diameter. No definite wall thickening. There is a relative paucity of colonic gas, although transverse colon gas is present. Lung bases are clear. No concerning intra-abdominal mass effect.  IMPRESSION: Gaseous distention of small bowel. Given no obstruction present on recent abdominal CT, this likely reflects mild ileus or enteritis.   Electronically Signed   By: Jorje Guild M.D.   On: 03/24/2014 05:35    Assessment: I'm not sure what has caused his recent abdominal pain and elevated lipase. I agree with holding antiretroviral therapy for now.  Plan: 1. Hold antiretroviral therapy 2. No indications for antibiotics 3. We will followup on Monday, August 24  Michel Bickers, MD [facility] [place] [phone] pager   [phone] cell 03/25/2014, 1:25 PM

## 2014-03-25 NOTE — Progress Notes (Signed)
Patient ID: [name], male   DOB: August 10, 1945, 68 y.o.   MRN: [id number] S:worsened respiratory status yesterday evening requiring dialysis with UF last night and doing much better this am but flat affect O:BP 125/70  Pulse 84  Temp(Src) 97.9 F (36.6 C) (Oral)  Resp 19  Ht 5\' 3"  (1.6 m)  Wt 56.1 kg (123 lb 10.9 oz)  BMI 21.91 kg/m2  SpO2 94%  Intake/Output Summary (Last 24 hours) at 03/25/14 0914 Last data filed at 03/25/14 0800  Gross per 24 hour  Intake   1182 ml  Output   4510 ml  Net  -3328 ml   Intake/Output: I/O last 3 completed shifts: In: 2787 [I.V.:2695; Other:30; IV Piggyback:62] Out: 4193 [Urine:530; Other:4000; Stool:100]  Intake/Output this shift:  Total I/O In: 10 [I.V.:10] Out: -  Weight change: 2.2 kg (4 lb 13.6 oz) Gen:WD male in NAD CVS:no rub Resp:bibasilar crackles XTK:WIOXBD Ext:+presacral edema   Recent Labs Lab 03/22/14 1241  03/22/14 2111 03/22/14 2230 03/23/14 0300  03/23/14 1020 03/23/14 1911 03/24/14 0420 03/24/14 1020 03/24/14 1600 03/24/14 2220 03/25/14 0411  NA 128*  --  126* 127* 128*  < > 134* 133* 134* 135* 135* 137 139  K 5.7*  --  6.2* 6.4* 5.5*  < > 4.5 4.3 4.0 4.1 3.9 4.1 3.7  CL 98  --  87* 91* 94*  < > 99 97 93* 94* 92* 90* 92*  CO2 12*  --  8* 7* 7*  < > 9* 11* 13* 16* 16* 14* 16*  GLUCOSE 166*  --  75 62* 134*  < > 78 82 139* 118* 80 125* 146*  BUN 60*  --  69* 69* 65*  < > 63* 67* 74* 77* 77* 83* 39*  CREATININE 7.12*  < > 7.40* 7.10* 6.80*  < > 6.81* 7.06* 7.50* 7.57* 7.65* 8.10* 4.68*  ALBUMIN 4.1  --  3.7 3.3*  --   --   --   --  2.3*  --   --   --  2.4*  CALCIUM 8.5  --  8.5 7.8* 7.6*  < > 6.5* 6.3* 6.2* 6.1* 6.5* 6.3* 7.2*  PHOS  --   --   --   --  8.8*  --   --   --  8.9*  --   --   --   --   AST 29  --  35 50*  --   --   --   --  42*  --   --   --  31  ALT 20  --  23 32  --   --   --   --  43  --   --   --  31  < > = values in this interval not displayed. Liver Function Tests:  Recent Labs Lab  03/22/14 2230 03/24/14 0420 03/25/14 0411  AST 50* 42* 31  ALT 32 43 31  ALKPHOS 50 44 54  BILITOT 0.7 0.3 0.5  PROT 6.7 4.9* 5.3*  ALBUMIN 3.3* 2.3* 2.4*    Recent Labs Lab 03/22/14 2111 03/23/14 1100 03/25/14 0411  LIPASE >3000* 2024* 250*  AMYLASE  --  1822*  --    No results found for this basename: AMMONIA,  in the last 168 hours CBC:  Recent Labs Lab 03/22/14 1241 03/22/14 2111 03/23/14 0300 03/24/14 0420 03/25/14 0411  WBC 10.5 15.8* 15.1* 9.7 9.9  NEUTROABS  --  13.7*  --  8.7* 8.9*  HGB 13.0 12.8* 12.4* 11.5* 11.0*  HCT 36.2* 35.7* 35.6* 31.9* 29.8*  MCV 94.3 96.7 97.8 93.8 92.8  PLT 155 164 133* 74* 89*   Cardiac Enzymes:  Recent Labs Lab 03/23/14 0008 03/23/14 0300 03/23/14 0826 03/23/14 1911  CKTOTAL 345*  --   --   --   TROPONINI  --  <0.30 <0.30 <0.30   CBG:  Recent Labs Lab 03/24/14 1549 03/24/14 2012 03/24/14 2322 03/25/14 0401 03/25/14 0734  GLUCAP 81 103* 109* 137* 85    Iron Studies: No results found for this basename: IRON, TIBC, TRANSFERRIN, FERRITIN,  in the last 72 hours Studies/Results: US Abdomen Complete  03/23/2014   CLINICAL DATA:  68 year old male with HIV presenting with emesis, diarrhea, weakness, acute renal failure and abnormal lipase. Initial encounter.  EXAM: ULTRASOUND ABDOMEN COMPLETE  COMPARISON:  CT Abdomen and Pelvis without contrast 03/22/2014. Abdomen ultrasound 11/18/2013.  FINDINGS: Gallbladder:  Wall thickening stable to decreased since April. No echogenic stones or sludge. No sonographic Murphy sign elicited.  Common bile duct:  Diameter: 6-8 mm diameter, upper limits of normal to mildly dilated (was 7 mm in April).  Liver:  Increased echogenicity, stable. No discrete liver lesion. No intrahepatic ductal dilatation identified.  IVC:  Incompletely visualized due to overlying bowel gas, visualized portions within normal limits.  Pancreas:  Incompletely visualized due to overlying bowel gas, visualized portions  within normal limits.  Spleen:  Size and appearance within normal limits.  Right Kidney:  Length: 11.6 cm. Mildly echogenic. No hydronephrosis or renal mass.  Left Kidney:  Length: 11.6 cm. Mildly echogenic. No hydronephrosis or left renal mass.  Abdominal aorta:  Incompletely visualized due to overlying bowel gas, visualized portions within normal limits.  Other findings:  No ascites identified.  IMPRESSION: 1. Visualized pancreas has a normal sonographic appearance. 2. No biliary sludge or gallstones. Upper limits of normal to mildly dilated CBD, the similar to the abdomen ultrasound in April. No intrahepatic biliary ductal dilatation identified. 3. Resolved small volume ascites. 4. Mildly echogenic kidneys, could reflect chronic medical renal disease or less likely developing HIV nephropathy.   Electronically Signed   By: Lars Pinks M.D.   On: 03/23/2014 16:09   Dg Chest Port 1 View  03/25/2014   CLINICAL DATA:  Respiratory failure  EXAM: PORTABLE CHEST - 1 VIEW  COMPARISON:  Prior chest x-ray 03/24/2014  FINDINGS: Fluctuating patchy bilateral airspace opacities. Slightly increased confluence of opacity in the left retrocardiac and left upper lung regions. The right upper and lower lobe opacities are similar. There are small bilateral layering pleural effusions. No pneumothorax. Stable borderline cardiomegaly. Patient is status post median sternotomy with evidence of prior multivessel CABG. Left IJ approach central venous catheter. Catheter tip in good position at the superior cavoatrial junction. Inspiratory volumes are low. No acute osseous abnormality.  IMPRESSION: 1. Asymmetric bilateral interstitial and airspace disease. Differential considerations include asymmetric pulmonary edema and multifocal pneumonia/pneumonitis. The asymmetry of the opacities is more conspicuous on today's examination with worse airspace disease on the left compared to the right. 2. Probable small bilateral layering pleural  effusions.   Electronically Signed   By: Jacqulynn Cadet M.D.   On: 03/25/2014 08:21   Dg Chest Port 1 View  03/24/2014   CLINICAL DATA:  Difficulty breathing  EXAM: PORTABLE CHEST - 1 VIEW  COMPARISON:  03/23/2014  FINDINGS: Unchanged cardiomegaly.  The patient is status post CABG.  Perihilar interstitial and airspace opacities persist. There are small bilateral pleural  effusions.  Unchanged positioning of left IJ catheter, tip at the level of the right atrium. No pneumothorax.  IMPRESSION: 1. Unchanged pulmonary edema with small pleural effusions. 2. Left IJ catheter, tip at the right atrium.   Electronically Signed   By: Jorje Guild M.D.   On: 03/24/2014 05:27   Dg Chest Portable 1 View  03/23/2014   CLINICAL DATA:  Left IJ catheter placement.  EXAM: PORTABLE CHEST - 1 VIEW  COMPARISON:  PA and lateral chest 10/18/2012.  FINDINGS: A new left IJ catheter is in place with the tip in the right atrium. Recommend withdrawal of 5.5 cm. Mild interstitial edema is present. There is no pneumothorax. Heart size is upper normal.  IMPRESSION: Tip of left IJ catheter is in the right atrium. Recommend withdrawal of 5.5 cm. Negative for pneumothorax.  Interstitial edema.  These results will be called to the ordering clinician or representative by the Radiologist Assistant, and communication documented in the PACS or zVision Dashboard.   Electronically Signed   By: Inge Rise M.D.   On: 03/23/2014 13:08   Dg Abd Portable 1v  03/24/2014   CLINICAL DATA:  Abdominal pain  EXAM: PORTABLE ABDOMEN - 1 VIEW  COMPARISON:  Abdominal CT 03/22/2014  FINDINGS: There is diffuse mild gaseous distension of small bowel, measuring up to 3.5 cm diameter. No definite wall thickening. There is a relative paucity of colonic gas, although transverse colon gas is present. Lung bases are clear. No concerning intra-abdominal mass effect.  IMPRESSION: Gaseous distention of small bowel. Given no obstruction present on recent abdominal  CT, this likely reflects mild ileus or enteritis.   Electronically Signed   By: Jorje Guild M.D.   On: 03/24/2014 05:35   . antiseptic oral rinse  7 mL Mouth Rinse q12n4p  . chlorhexidine  15 mL Mouth Rinse BID  . insulin aspart  2-6 Units Subcutaneous 6 times per day  . pantoprazole (PROTONIX) IV  40 mg Intravenous QHS    BMET    Component Value Date/Time   NA 139 03/25/2014 0411   K 3.7 03/25/2014 0411   CL 92* 03/25/2014 0411   CO2 16* 03/25/2014 0411   GLUCOSE 146* 03/25/2014 0411   BUN 39* 03/25/2014 0411   CREATININE 4.68* 03/25/2014 0411   CREATININE 7.12* 03/22/2014 1241   CALCIUM 7.2* 03/25/2014 0411   GFRNONAA 12* 03/25/2014 0411   GFRNONAA >60 04/17/2011 0851   GFRAA 14* 03/25/2014 0411   GFRAA >60 04/17/2011 0851   CBC    Component Value Date/Time   WBC 9.9 03/25/2014 0411   RBC 3.21* 03/25/2014 0411   HGB 11.0* 03/25/2014 0411   HCT 29.8* 03/25/2014 0411   PLT 89* 03/25/2014 0411   MCV 92.8 03/25/2014 0411   MCH 34.3* 03/25/2014 0411   MCHC 36.9* 03/25/2014 0411   RDW 13.4 03/25/2014 0411   LYMPHSABS 0.3* 03/25/2014 0411   MONOABS 0.7 03/25/2014 0411   EOSABS 0.0 03/25/2014 0411   EOSABS 0.3 K/UL 06/12/2006 2009   BASOSABS 0.0 03/25/2014 0411    67yo BM with HIV admitted 03/22/14 for 3wks N/V/D that significantly worsened over last 3 days. PO intake has been poor and he was on lisinopril. No hx renal disease though has had HTN x 37yrs and DM x 3-68yrs. Labs suggest pancreatitis with elevated amylase and lipase. Bicarb low and has been on metformin. Scr 02/20/14 was .87 and on admission was 7.12.  Initiation of dialysis on 03/24/14 due to increasing O2 demands/resp  distress Assessment/Plan:  1. ARF, oliguric in setting of volume depletion, ACE-inhibition, metformin, and pancreatitis.  1. Developed increasing O2 demands and worsening pulmonary status and s/p initial HD on 03/24/14 with UF of 3L and marked improvement of respiratory status 2. No significant improvement of Scr with IVF's  but UOP of 200 yesterday. 3. Will plan for HD again today given pleural effusions and diuretic refractory oliguria. 2. Metabolic Acidosis sec ARF, diarrhea +/- metformin  1. Some improvement with IV bicarb and holding metformin, as well as HD 3. Pancreatitis- per PCCM 4. Hyponatremia- improving with volume replacement 5. Hypoxia- now with asymmetrical air space disease but improved with UF on HD. 6. HIV- per ID 7. HTN- stable 8. DM- per PCCM 9. Hypocalcemia- worsened with correction of acidosis, Will follow ?saponification of pancreatitis. Replete per PCCM 10. Dispo- per PCCM.  [place] A

## 2014-03-26 DIAGNOSIS — R197 Diarrhea, unspecified: Secondary | ICD-10-CM

## 2014-03-26 DIAGNOSIS — K859 Acute pancreatitis without necrosis or infection, unspecified: Secondary | ICD-10-CM

## 2014-03-26 LAB — GLUCOSE, CAPILLARY
GLUCOSE-CAPILLARY: 131 mg/dL — AB (ref 70–99)
GLUCOSE-CAPILLARY: 147 mg/dL — AB (ref 70–99)
GLUCOSE-CAPILLARY: 149 mg/dL — AB (ref 70–99)
Glucose-Capillary: 114 mg/dL — ABNORMAL HIGH (ref 70–99)
Glucose-Capillary: 128 mg/dL — ABNORMAL HIGH (ref 70–99)
Glucose-Capillary: 247 mg/dL — ABNORMAL HIGH (ref 70–99)

## 2014-03-26 LAB — BASIC METABOLIC PANEL
ANION GAP: 25 — AB (ref 5–15)
BUN: 35 mg/dL — ABNORMAL HIGH (ref 6–23)
CALCIUM: 7.5 mg/dL — AB (ref 8.4–10.5)
CHLORIDE: 96 meq/L (ref 96–112)
CO2: 18 mEq/L — ABNORMAL LOW (ref 19–32)
Creatinine, Ser: 4.26 mg/dL — ABNORMAL HIGH (ref 0.50–1.35)
GFR calc Af Amer: 15 mL/min — ABNORMAL LOW (ref >90)
GFR calc non Af Amer: 13 mL/min — ABNORMAL LOW (ref >90)
Glucose, Bld: 120 mg/dL — ABNORMAL HIGH (ref 70–99)
Potassium: 3.6 mEq/L — ABNORMAL LOW (ref 3.7–5.3)
Sodium: 139 mEq/L (ref 137–147)

## 2014-03-26 LAB — LIPASE, BLOOD: LIPASE: 542 U/L — AB (ref 11–59)

## 2014-03-26 MED ORDER — INSULIN ASPART 100 UNIT/ML ~~LOC~~ SOLN
0.0000 [IU] | Freq: Every day | SUBCUTANEOUS | Status: DC
  Administered 2014-03-30 – 2014-04-08 (×2): 2 [IU] via SUBCUTANEOUS

## 2014-03-26 MED ORDER — INSULIN ASPART 100 UNIT/ML ~~LOC~~ SOLN
0.0000 [IU] | Freq: Three times a day (TID) | SUBCUTANEOUS | Status: DC
  Administered 2014-03-26: 2 [IU] via SUBCUTANEOUS
  Administered 2014-03-27 (×2): 3 [IU] via SUBCUTANEOUS
  Administered 2014-03-27 – 2014-03-29 (×4): 2 [IU] via SUBCUTANEOUS
  Administered 2014-03-30: 5 [IU] via SUBCUTANEOUS
  Administered 2014-03-30: 3 [IU] via SUBCUTANEOUS
  Administered 2014-03-30: 2 [IU] via SUBCUTANEOUS
  Administered 2014-03-31: 11 [IU] via SUBCUTANEOUS
  Administered 2014-03-31 (×2): 3 [IU] via SUBCUTANEOUS
  Administered 2014-04-01: 2 [IU] via SUBCUTANEOUS
  Administered 2014-04-01: 8 [IU] via SUBCUTANEOUS
  Administered 2014-04-02: 5 [IU] via SUBCUTANEOUS
  Administered 2014-04-02: 2 [IU] via SUBCUTANEOUS
  Administered 2014-04-04: 5 [IU] via SUBCUTANEOUS
  Administered 2014-04-04: 8 [IU] via SUBCUTANEOUS
  Administered 2014-04-05: 2 [IU] via SUBCUTANEOUS
  Administered 2014-04-05: 8 [IU] via SUBCUTANEOUS
  Administered 2014-04-05: 2 [IU] via SUBCUTANEOUS
  Administered 2014-04-06 (×2): 3 [IU] via SUBCUTANEOUS
  Administered 2014-04-06 – 2014-04-07 (×2): 2 [IU] via SUBCUTANEOUS
  Administered 2014-04-07 (×2): 3 [IU] via SUBCUTANEOUS
  Administered 2014-04-08 (×3): 2 [IU] via SUBCUTANEOUS
  Administered 2014-04-09 (×2): 3 [IU] via SUBCUTANEOUS
  Administered 2014-04-10 (×2): 2 [IU] via SUBCUTANEOUS

## 2014-03-26 NOTE — Progress Notes (Signed)
PULMONARY / CRITICAL CARE MEDICINE   Name: [name] MRN: [id number] DOB: Jan 05, 1946    ADMISSION DATE:  03/22/2014 CONSULTATION DATE:  03/23/2014  REFERRING MD :  Frost:  Emesis  INITIAL PRESENTATION: 68 year old male with HIV on STRIBILD presented to [place] c/o emesis, diarrhea, and generalized weakness x 3 days . In ED found to be in renal failure with elevated lipase. Transferred to [facility] for ICU admission and PCCM eval.    STUDIES:  8/19 CT abd/pelvis - Distended stomach with interstitial fluid changes, cannot exclude gastric outlet obstruction. Mesenteric edema. Abdominal/pelvic fluid stable from prior exam. Body wall edema. No hydronephrosis 8/20 [place] abdo>>>Visualized pancreas has a normal sonographic appearance. No biliary sludge or gallstones. Upper limits of normal to mildly dilated CBD, the similar to the abdomen ultrasound in April. No intrahepatic biliary ductal dilatation identified 8/21 TTE > Mild LVH, EF 60%, PAP 7mHg   SIGNIFICANT EVENTS: 8/19 admitted to ICU 8/20 ARF, poor output  SUBJECTIVE:  More awake.  Tol clear liquids. No c/o. Denies abd pain.   VITAL SIGNS: Temp:  [97.8 F (36.6 C)-98.8 F (37.1 C)] 97.8 F (36.6 C) (08/23 0727) Pulse Rate:  [72-107] 73 (08/23 0700) Resp:  [16-32] 18 (08/23 0700) BP: (110-158)/(60-83) 131/66 mmHg (08/23 0700) SpO2:  [91 %-100 %] 92 % (08/23 0700) Weight:  [122 lb 9.2 oz (55.6 kg)-127 lb 10.3 oz (57.9 kg)] 123 lb 7.3 oz (56 kg) (08/23 0500) 2L [place]  HEMODYNAMICS: CVP:  [3 mmHg-15 mmHg] 6 mmHg VENTILATOR SETTINGS:   INTAKE / OUTPUT:  Intake/Output Summary (Last 24 hours) at 03/26/14 [id number] data filed at 03/26/14 0700  Gross per 24 hour  Intake    395 ml  Output   2400 ml  Net  -2005 ml    PHYSICAL EXAMINATION: General:  Thin male, awake, in NAD Neuro:  Alert, oriented, more awake HEENT:  bitemporal wasting Cardiovascular:  RRR, no MRG Lungs: resps even non labored on [place],  few bibasilar crackles otherwise ess clear  Abdomen:  Mildly distended, tender mild epigastric, no r/g Musculoskeletal:  No acute deformity or ROM limitation. No peripheral edema.  Skin:  Intact.   LABS:  CBC  Recent Labs Lab 03/23/14 0300 03/24/14 0420 03/25/14 0411  WBC 15.1* 9.7 9.9  HGB 12.4* 11.5* 11.0*  HCT 35.6* 31.9* 29.8*  PLT 133* 74* 89*   Coag's  Recent Labs Lab 03/23/14 0300  INR 1.56*   BMET  Recent Labs Lab 03/25/14 0411 03/25/14 1020 03/26/14 0350  NA 139 139 139  K 3.7 3.9 3.6*  CL 92* 93* 96  CO2 16* 19 18*  BUN 39* 47* 35*  CREATININE 4.68* 5.48* 4.26*  GLUCOSE 146* 88 120*   Electrolytes  Recent Labs Lab 03/23/14 0300  03/24/14 0420  03/25/14 0411 03/25/14 1020 03/26/14 0350  CALCIUM 7.6*  < > 6.2*  < > 7.2* 7.1* 7.5*  MG 2.1  --  1.7  --   --   --   --   PHOS 8.8*  --  8.9*  --   --   --   --   < > = values in this interval not displayed. Sepsis Markers  Recent Labs Lab 03/23/14 0300 03/23/14 1255 03/23/14 1911  LATICACIDVEN 5.8* 5.1* 3.8*   ABG  Recent Labs Lab 03/23/14 1150 03/24/14 0236 03/25/14 0256  PHART 7.215* 7.343* 7.456*  PCO2ART 17.8* 22.8* 21.9*  PO2ART 74.9* 66.1* 101.0*  Liver Enzymes  Recent Labs Lab 03/22/14 2230 03/24/14 0420 03/25/14 0411  AST 50* 42* 31  ALT 32 43 31  ALKPHOS 50 44 54  BILITOT 0.7 0.3 0.5  ALBUMIN 3.3* 2.3* 2.4*   Cardiac Enzymes  Recent Labs Lab 03/23/14 0300 03/23/14 0826 03/23/14 1911  TROPONINI <0.30 <0.30 <0.30   Glucose  Recent Labs Lab 03/25/14 1115 03/25/14 1517 03/25/14 1928 03/25/14 2351 03/26/14 0348 03/26/14 0712  GLUCAP 95 102* 149* 131* 114* 128*    Imaging Dg Chest Port 1 View  03/25/2014   CLINICAL DATA:  Respiratory failure  EXAM: PORTABLE CHEST - 1 VIEW  COMPARISON:  Prior chest x-ray 03/24/2014  FINDINGS: Fluctuating patchy bilateral airspace opacities. Slightly increased confluence of opacity in the left retrocardiac and left  upper lung regions. The right upper and lower lobe opacities are similar. There are small bilateral layering pleural effusions. No pneumothorax. Stable borderline cardiomegaly. Patient is status post median sternotomy with evidence of prior multivessel CABG. Left IJ approach central venous catheter. Catheter tip in good position at the superior cavoatrial junction. Inspiratory volumes are low. No acute osseous abnormality.  IMPRESSION: 1. Asymmetric bilateral interstitial and airspace disease. Differential considerations include asymmetric pulmonary edema and multifocal pneumonia/pneumonitis. The asymmetry of the opacities is more conspicuous on today's examination with worse airspace disease on the left compared to the right. 2. Probable small bilateral layering pleural effusions.   Electronically Signed   By: Jacqulynn Cadet M.D.   On: 03/25/2014 08:21     ASSESSMENT / PLAN:  PULMONARY A: Acute on chronic respiratory failure COPD without evidence of exacerbation. Uncompensated met acidosis pulm edema? Vs pancreatitis induced ALI P:   Supplemental O2 as needed to maintain SpO2 greater than 92% BD's PRN pcxr in am  Continue volume removal per renal - net NEG 2L last 24 hours - remains net POS for admission  CARDIOVASCULAR A:  H/o HTN, DAS s/p remote CABG, hypovolemia pulm edema? > appears to be improving P:  Cont hold home rx (amlodipine, atenolol, asa, lisinopril/hctz, statin) Consider slowly restart as BP trends up  RENAL A:   Acute renal failure, oliguric Hyperkalemia - resolved Hyponatremia (likley hypotonic, hypovolemic) - improving with saline High AG metabolic acidosis (suspect lactic, uremic) - slow progress P:   F/u chem  HD per renal - hopeful for renal recovery    GASTROINTESTINAL A:   Severe acute pancreatitis, possibly 2nd to STRIBILD (hx of this with other ARV meds) Unclear etiology, abd [place] > no obvious pancreatic abnormality, similar dilated CBD compared with  4/'15 P:   Cont clear liquids  SUP: IV PPI PRN zofran Follow lipase Appreciate GI input, he may merit MRCP in the future  HEMATOLOGIC A:   Anemia Leukocytosis Thrombocytopenia (sirs?) P:  Follow CBC in am  scd  INFECTIOUS A:   SIRS HIV hypothermic  P:   BCx2 8/20 >>> UC 8/20 >>>neg  Stool  mod acid fast>>>negative  Cdiff>>> negative Imipenem 8/20>>> 8/21 vanc 8/20>>> 8/21   Stop all abx and follow ID following  HIV rx on hold   ENDOCRINE A:   DM P:   CBG monitoring and SSI  NEUROLOGIC A:   Acute encephalopathy in setting of acidosis - resolved.  P:   RASS goal: 0   SUMMARY: overall much improved.  Nephrology hopeful for renal recovery.  No plans for repeat HD at this time.  Cont monitor lipase while allowing clear liquids. ID following.  Will need to determine when/what to  resume as far as HIV rx.  Hemodynamically stable.  Will tx floor, [facility].   Nickolas Madrid, NP 03/26/2014  9:39 AM Pager: [phone] or [phone]  *Care during the described time interval was provided by me and/or other providers on the critical care team. I have reviewed this patient's available data, including medical history, events of note, physical examination and test results as part of my evaluation.

## 2014-03-26 NOTE — Progress Notes (Addendum)
Admission note:   Arrival Method: Transfer from 15M at 1425. Mental Status: A&OX4. Telemetry: Placed on box #28.  Skin: Intact. Prophylactic sacral dressing in place.  Tubes: Foley catheter in place. IV: 2 PIV's on RFA. Right femoral temporary HD catheter. Pain: Some abdominal discomfort; denies need for pain meds. Family: Daughter at bedside. Living Situation: From home. Safety Measures: Bed alarm in place, call bell within reach. 6E Orientation: Oriented to unit and surroundings.  Per Dr. Lamonte Sakai, it's okay to leave continuous pulse ox off.   Joellen Jersey, RN.

## 2014-03-26 NOTE — Progress Notes (Signed)
[facility] Progress Note  Subjective:  Feeling better.  Tolerated a small amount of clear liquids.  Just mild abdominal discomfort and nausea.  No vomiting.  Still has rectal tube in place.  Begin transferred to [place] shortly.  Lipase higher today.  Objective:  Vital signs in last 24 hours: Temp:  [97.8 F (36.6 C)-98.8 F (37.1 C)] 97.8 F (36.6 C) (08/23 0727) Pulse Rate:  [69-107] 88 (08/23 0900) Resp:  [16-32] 27 (08/23 0900) BP: (110-158)/(60-83) 151/77 mmHg (08/23 0900) SpO2:  [92 %-100 %] 95 % (08/23 0900) Weight:  [122 lb 9.2 oz (55.6 kg)-127 lb 10.3 oz (57.9 kg)] 123 lb 7.3 oz (56 kg) (08/23 0500) Last BM Date: 03/26/14 General:  Alert, thin with temporal wasting, in NAD Heart:  Regular rate and rhythm; no murmurs Pulm:  Tachypneic.  Lung clear.  Abdomen:  Soft, non-distended.  BS present.  Mild TTP in epigastrium without R/R/G.  Extremities:  Without edema. Neurologic:  Alert and  oriented x4;  grossly normal neurologically. Psych:  Alert and cooperative. Normal mood and affect.  Intake/Output from previous day: 08/22 0701 - 08/23 0700 In: 415 [I.V.:240] Out: 2400  Intake/Output this shift: Total I/O In: 75 [I.V.:30; Other:45] Out: -   Lab Results:  Recent Labs  03/24/14 0420 03/25/14 0411  WBC 9.7 9.9  HGB 11.5* 11.0*  HCT 31.9* 29.8*  PLT 74* 89*   BMET  Recent Labs  03/25/14 0411 03/25/14 1020 03/26/14 0350  NA 139 139 139  K 3.7 3.9 3.6*  CL 92* 93* 96  CO2 16* 19 18*  GLUCOSE 146* 88 120*  BUN 39* 47* 35*  CREATININE 4.68* 5.48* 4.26*  CALCIUM 7.2* 7.1* 7.5*   LFT  Recent Labs  03/25/14 0411  PROT 5.3*  ALBUMIN 2.4*  AST 31  ALT 31  ALKPHOS 54  BILITOT 0.5   Hepatitis Panel  Recent Labs  03/24/14 2230  HEPBSAG NEGATIVE    Dg Chest Port 1 View  03/25/2014   CLINICAL DATA:  Respiratory failure  EXAM: PORTABLE CHEST - 1 VIEW  COMPARISON:  Prior chest x-ray 03/24/2014  FINDINGS: Fluctuating patchy bilateral  airspace opacities. Slightly increased confluence of opacity in the left retrocardiac and left upper lung regions. The right upper and lower lobe opacities are similar. There are small bilateral layering pleural effusions. No pneumothorax. Stable borderline cardiomegaly. Patient is status post median sternotomy with evidence of prior multivessel CABG. Left IJ approach central venous catheter. Catheter tip in good position at the superior cavoatrial junction. Inspiratory volumes are low. No acute osseous abnormality.  IMPRESSION: 1. Asymmetric bilateral interstitial and airspace disease. Differential considerations include asymmetric pulmonary edema and multifocal pneumonia/pneumonitis. The asymmetry of the opacities is more conspicuous on today's examination with worse airspace disease on the left compared to the right. 2. Probable small bilateral layering pleural effusions.   Electronically Signed   By: Jacqulynn Cadet M.D.   On: 03/25/2014 08:21    Assessment / Plan: * Recurrent pancreatitis. Interesting that Lipase is elevated (up to 542 today from 250 yesterday), but pancreas imaging normal. In April had gallbladder sludge but normal HIDA. Current CT scan with no pancreatitis but interstitial changes in stomach and thickened SB with possible GOO. Current Ultrasound reading with prominent 6 - 8 mm CBD. Previous fatty liver has resolved. Continue supportive care.  Would continue clear liquids only for now.  * SB ileus. May need EGD/enteroscopy to evaluate given thickened duodenum and chronic diarrhea in  HIV pt to rule out neoplasia/infection. Continue supportive care with clear liquids only for now.   * Chronic diarrhea for at least 6 months. C diff and stool pathogen panels negative starting in 11/2013. Fecal lactoferrin is positive. Colonoscopy 07/2012 with tubular adenomatous polyp and hemorrhoids. Has rectal tube in place.  CT with slight duodenal thickening.   * ARF. Underwent HD, but planning to  hold further HD for now with hope or recovery of kidney function. Renal following.   * Acute resp failure:  Improved.     * HIV. Compliant with HAART. Combivir and Viramune stopped in 11/2013 when felt to be cause of pancreatitis. Stribild initiated in mid 11/2013, but on hold for now.   -Consider MRCP and EGD/enteroscopy when respiratory status and renal function allow.     LOS: 4 days   ZEHR, JESSICA D.  03/26/2014, 11:11 AM  Pager number [phone]     Attending physician's note   I have taken an interval history, reviewed the chart and examined the patient. I agree with the Advanced Practitioner's note, impression and recommendations. Lipase has increased from 250 yesterday to 542 today. Resp status substantially improved. Continue supportive mgmt for pancreatitis and suspected ileus. Consider MRCP and EGD/enteroscopy when respiratory status and renal function allow.   Pricilla Riffle. Fuller Plan, MD [facility]

## 2014-03-26 NOTE — Progress Notes (Signed)
Patient ID: STANLY SI, male   DOB: 09-12-1945, 68 y.o.   MRN: [id number] S:more awake/alert this am.  Eating, no complaints O:BP 131/66  Pulse 73  Temp(Src) 97.8 F (36.6 C) (Oral)  Resp 18  Ht 5\' 3"  (1.6 m)  Wt 56 kg (123 lb 7.3 oz)  BMI 21.88 kg/m2  SpO2 92%  Intake/Output Summary (Last 24 hours) at 03/26/14 0910 Last data filed at 03/26/14 0700  Gross per 24 hour  Intake    395 ml  Output   2400 ml  Net  -2005 ml   Intake/Output: I/O last 3 completed shifts: In: 458 [I.V.:760; Other:175] Out: 0998 [Urine:250; Other:6400]  Intake/Output this shift:    Weight change: -2.2 kg (-4 lb 13.6 oz) Gen:NAD CVS:no rub Resp:minimal crackles at bases Abd:+bs,soft Ext:no edema   Recent Labs Lab 03/22/14 1241  03/22/14 2111 03/22/14 2230 03/23/14 0300  03/24/14 0420 03/24/14 1020 03/24/14 1600 03/24/14 2220 03/25/14 0411 03/25/14 1020 03/26/14 0350  NA 128*  --  126* 127* 128*  < > 134* 135* 135* 137 139 139 139  K 5.7*  --  6.2* 6.4* 5.5*  < > 4.0 4.1 3.9 4.1 3.7 3.9 3.6*  CL 98  --  87* 91* 94*  < > 93* 94* 92* 90* 92* 93* 96  CO2 12*  --  8* 7* 7*  < > 13* 16* 16* 14* 16* 19 18*  GLUCOSE 166*  --  75 62* 134*  < > 139* 118* 80 125* 146* 88 120*  BUN 60*  --  69* 69* 65*  < > 74* 77* 77* 83* 39* 47* 35*  CREATININE 7.12*  < > 7.40* 7.10* 6.80*  < > 7.50* 7.57* 7.65* 8.10* 4.68* 5.48* 4.26*  ALBUMIN 4.1  --  3.7 3.3*  --   --  2.3*  --   --   --  2.4*  --   --   CALCIUM 8.5  --  8.5 7.8* 7.6*  < > 6.2* 6.1* 6.5* 6.3* 7.2* 7.1* 7.5*  PHOS  --   --   --   --  8.8*  --  8.9*  --   --   --   --   --   --   AST 29  --  35 50*  --   --  42*  --   --   --  31  --   --   ALT 20  --  23 32  --   --  43  --   --   --  31  --   --   < > = values in this interval not displayed. Liver Function Tests:  Recent Labs Lab 03/22/14 2230 03/24/14 0420 03/25/14 0411  AST 50* 42* 31  ALT 32 43 31  ALKPHOS 50 44 54  BILITOT 0.7 0.3 0.5  PROT 6.7 4.9* 5.3*  ALBUMIN 3.3* 2.3*  2.4*    Recent Labs Lab 03/23/14 1100 03/25/14 0411 03/26/14 0350  LIPASE 2024* 250* 542*  AMYLASE 1822*  --   --    No results found for this basename: AMMONIA,  in the last 168 hours CBC:  Recent Labs Lab 03/22/14 1241 03/22/14 2111 03/23/14 0300 03/24/14 0420 03/25/14 0411  WBC 10.5 15.8* 15.1* 9.7 9.9  NEUTROABS  --  13.7*  --  8.7* 8.9*  HGB 13.0 12.8* 12.4* 11.5* 11.0*  HCT 36.2* 35.7* 35.6* 31.9* 29.8*  MCV 94.3 96.7 97.8 93.8 92.8  PLT 155 164 133* 74* 89*   Cardiac Enzymes:  Recent Labs Lab 03/23/14 0008 03/23/14 0300 03/23/14 0826 03/23/14 1911  CKTOTAL 345*  --   --   --   TROPONINI  --  <0.30 <0.30 <0.30   CBG:  Recent Labs Lab 03/25/14 1517 03/25/14 1928 03/25/14 2351 03/26/14 0348 03/26/14 0712  GLUCAP 102* 149* 131* 114* 128*    Iron Studies: No results found for this basename: IRON, TIBC, TRANSFERRIN, FERRITIN,  in the last 72 hours Studies/Results: Dg Chest Port 1 View  03/25/2014   CLINICAL DATA:  Respiratory failure  EXAM: PORTABLE CHEST - 1 VIEW  COMPARISON:  Prior chest x-ray 03/24/2014  FINDINGS: Fluctuating patchy bilateral airspace opacities. Slightly increased confluence of opacity in the left retrocardiac and left upper lung regions. The right upper and lower lobe opacities are similar. There are small bilateral layering pleural effusions. No pneumothorax. Stable borderline cardiomegaly. Patient is status post median sternotomy with evidence of prior multivessel CABG. Left IJ approach central venous catheter. Catheter tip in good position at the superior cavoatrial junction. Inspiratory volumes are low. No acute osseous abnormality.  IMPRESSION: 1. Asymmetric bilateral interstitial and airspace disease. Differential considerations include asymmetric pulmonary edema and multifocal pneumonia/pneumonitis. The asymmetry of the opacities is more conspicuous on today's examination with worse airspace disease on the left compared to the right.  2. Probable small bilateral layering pleural effusions.   Electronically Signed   By: Jacqulynn Cadet M.D.   On: 03/25/2014 08:21   . antiseptic oral rinse  7 mL Mouth Rinse q12n4p  . chlorhexidine  15 mL Mouth Rinse BID  . insulin aspart  2-6 Units Subcutaneous 6 times per day  . pantoprazole (PROTONIX) IV  40 mg Intravenous QHS    BMET    Component Value Date/Time   NA 139 03/26/2014 0350   K 3.6* 03/26/2014 0350   CL 96 03/26/2014 0350   CO2 18* 03/26/2014 0350   GLUCOSE 120* 03/26/2014 0350   BUN 35* 03/26/2014 0350   CREATININE 4.26* 03/26/2014 0350   CREATININE 7.12* 03/22/2014 1241   CALCIUM 7.5* 03/26/2014 0350   GFRNONAA 13* 03/26/2014 0350   GFRNONAA >60 04/17/2011 0851   GFRAA 15* 03/26/2014 0350   GFRAA >60 04/17/2011 0851   CBC    Component Value Date/Time   WBC 9.9 03/25/2014 0411   RBC 3.21* 03/25/2014 0411   HGB 11.0* 03/25/2014 0411   HCT 29.8* 03/25/2014 0411   PLT 89* 03/25/2014 0411   MCV 92.8 03/25/2014 0411   MCH 34.3* 03/25/2014 0411   MCHC 36.9* 03/25/2014 0411   RDW 13.4 03/25/2014 0411   LYMPHSABS 0.3* 03/25/2014 0411   MONOABS 0.7 03/25/2014 0411   EOSABS 0.0 03/25/2014 0411   EOSABS 0.3 K/UL 06/12/2006 2009   BASOSABS 0.0 03/25/2014 0411     67yo BM with HIV admitted 03/22/14 for 3wks N/V/D that significantly worsened over the 3 days PTA. PO intake had been poor and he was on lisinopril. No hx renal disease though has had HTN x 26yrs and DM x 3-75yrs. Labs suggest pancreatitis with elevated amylase and lipase. Bicarb low and has been on metformin. Scr 02/20/14 was .87 and on admission was 7.12. Initiation of dialysis on 03/24/14 due to increasing O2 demands/resp distress and persistent oliguria.   Assessment/Plan:  1. ARF, oliguric in setting of volume depletion, ACE-inhibition, metformin, and pancreatitis.  1. Developed increasing O2 demands and worsening pulmonary status and s/p initial HD on 03/24/14 with UF  of 3L and marked improvement of respiratory status and  again 03/25/14 2. No significant improvement of Scr with IVF's but UOP of 440 yesterday (200 the day before). 3. Will hold off on further HD for now and con  to follow UOP and daily Scr.  Hopefully we are starting to see some signs of renal recovery.   2. Metabolic Acidosis sec ARF, diarrhea +/- metformin  1. Some improvement with IV bicarb and holding metformin, as well as HD 3. Pancreatitis- per PCCM 4. Hyponatremia- improved 5. Hypoxia- now with asymmetrical air space disease but improved with UF on HD. 6. HIV- per ID 7. HTN- stable 8. DM- per PCCM 9. Hypocalcemia- improved 10. Dispo- per PCCM. [place]

## 2014-03-27 ENCOUNTER — Inpatient Hospital Stay (HOSPITAL_COMMUNITY): Payer: Medicare Other

## 2014-03-27 DIAGNOSIS — K859 Acute pancreatitis without necrosis or infection, unspecified: Secondary | ICD-10-CM

## 2014-03-27 DIAGNOSIS — R197 Diarrhea, unspecified: Secondary | ICD-10-CM

## 2014-03-27 LAB — CBC
HCT: 26.5 % — ABNORMAL LOW (ref 39.0–52.0)
Hemoglobin: 9.5 g/dL — ABNORMAL LOW (ref 13.0–17.0)
MCH: 34.3 pg — ABNORMAL HIGH (ref 26.0–34.0)
MCHC: 35.8 g/dL (ref 30.0–36.0)
MCV: 95.7 fL (ref 78.0–100.0)
PLATELETS: 93 10*3/uL — AB (ref 150–400)
RBC: 2.77 MIL/uL — ABNORMAL LOW (ref 4.22–5.81)
RDW: 13.7 % (ref 11.5–15.5)
WBC: 4.2 10*3/uL (ref 4.0–10.5)

## 2014-03-27 LAB — COMPREHENSIVE METABOLIC PANEL
ALBUMIN: 2.2 g/dL — AB (ref 3.5–5.2)
ALT: 14 U/L (ref 0–53)
ANION GAP: 21 — AB (ref 5–15)
AST: 21 U/L (ref 0–37)
Alkaline Phosphatase: 60 U/L (ref 39–117)
BUN: 58 mg/dL — ABNORMAL HIGH (ref 6–23)
CHLORIDE: 96 meq/L (ref 96–112)
CO2: 19 mEq/L (ref 19–32)
CREATININE: 6.04 mg/dL — AB (ref 0.50–1.35)
Calcium: 7.8 mg/dL — ABNORMAL LOW (ref 8.4–10.5)
GFR calc Af Amer: 10 mL/min — ABNORMAL LOW (ref >90)
GFR calc non Af Amer: 9 mL/min — ABNORMAL LOW (ref >90)
Glucose, Bld: 158 mg/dL — ABNORMAL HIGH (ref 70–99)
POTASSIUM: 3.7 meq/L (ref 3.7–5.3)
Sodium: 136 mEq/L — ABNORMAL LOW (ref 137–147)
TOTAL PROTEIN: 5.4 g/dL — AB (ref 6.0–8.3)
Total Bilirubin: 0.5 mg/dL (ref 0.3–1.2)

## 2014-03-27 LAB — GLUCOSE, CAPILLARY
GLUCOSE-CAPILLARY: 207 mg/dL — AB (ref 70–99)
GLUCOSE-CAPILLARY: 140 mg/dL — AB (ref 70–99)
Glucose-Capillary: 143 mg/dL — ABNORMAL HIGH (ref 70–99)

## 2014-03-27 LAB — LIPASE, BLOOD: LIPASE: 1420 U/L — AB (ref 11–59)

## 2014-03-27 LAB — AMYLASE: AMYLASE: 847 U/L — AB (ref 0–105)

## 2014-03-27 MED ORDER — HYDROCODONE-ACETAMINOPHEN 5-325 MG PO TABS
1.0000 | ORAL_TABLET | Freq: Four times a day (QID) | ORAL | Status: DC | PRN
  Administered 2014-03-27 – 2014-04-03 (×4): 1 via ORAL
  Administered 2014-04-06: 2 via ORAL
  Filled 2014-03-27 (×3): qty 1
  Filled 2014-03-27: qty 2
  Filled 2014-03-27: qty 1

## 2014-03-27 MED ORDER — ATENOLOL 50 MG PO TABS
50.0000 mg | ORAL_TABLET | Freq: Every day | ORAL | Status: DC
  Administered 2014-03-27 – 2014-04-10 (×14): 50 mg via ORAL
  Filled 2014-03-27 (×15): qty 1

## 2014-03-27 NOTE — Progress Notes (Signed)
[facility]  Date of Admission:  03/22/2014  Antibiotics: none  Subjective: No complaints  Objective: Temp:  [98.6 F (37 C)-99.5 F (37.5 C)] 98.6 F (37 C) (08/24 0900) Pulse Rate:  [72-78] 78 (08/24 0900) Resp:  [18] 18 (08/24 0900) BP: (154-169)/(70-77) 162/73 mmHg (08/24 0900) SpO2:  [94 %-96 %] 94 % (08/24 0900) Weight:  [120 lb 12.8 oz (54.795 kg)] 120 lb 12.8 oz (54.795 kg) (08/23 2107)  General: awake, alert, nad Skin: no rashes Lungs: CTA B Cor: RRR Abdomen: soft, nt, nd Ext: no edema  Lab Results Lab Results  Component Value Date   WBC 4.2 03/27/2014   HGB 9.5* 03/27/2014   HCT 26.5* 03/27/2014   MCV 95.7 03/27/2014   PLT 93* 03/27/2014    Lab Results  Component Value Date   CREATININE 6.04* 03/27/2014   BUN 58* 03/27/2014   NA 136* 03/27/2014   K 3.7 03/27/2014   CL 96 03/27/2014   CO2 19 03/27/2014    Lab Results  Component Value Date   ALT 14 03/27/2014   AST 21 03/27/2014   ALKPHOS 60 03/27/2014   BILITOT 0.5 03/27/2014      Microbiology: Recent Results (from the past 240 hour(s))  MRSA PCR SCREENING     Status: None   Collection Time    03/23/14  1:50 AM      Result Value Ref Range Status   MRSA by PCR NEGATIVE  NEGATIVE Final   Comment:            The GeneXpert MRSA Assay (FDA     approved for NASAL specimens     only), is one component of a     comprehensive MRSA colonization     surveillance program. It is not     intended to diagnose MRSA     infection nor to guide or     monitor treatment for     MRSA infections.  URINE CULTURE     Status: None   Collection Time    03/23/14  2:56 AM      Result Value Ref Range Status   Specimen Description URINE, CATHETERIZED   Final   Special Requests NONE   Final   Culture  Setup Time     Final   Value: 03/23/2014 09:02     Performed at [facility] Count     Final   Value: NO GROWTH     Performed at [facility]   Culture     Final   Value: NO GROWTH     Performed at [facility]   Report Status 03/24/2014 FINAL   Final  CULTURE, BLOOD (ROUTINE X 2)     Status: None   Collection Time    03/23/14  3:00 AM      Result Value Ref Range Status   Specimen Description BLOOD LEFT ARM   Final   Special Requests BOTTLES DRAWN AEROBIC AND ANAEROBIC 10CC   Final   Culture  Setup Time     Final   Value: 03/23/2014 08:48     Performed at [facility]   Culture     Final   Value:        BLOOD CULTURE RECEIVED NO GROWTH TO DATE CULTURE WILL BE HELD FOR 5 DAYS BEFORE ISSUING A FINAL NEGATIVE REPORT     Performed at [facility]   Report Status PENDING  Incomplete  CULTURE, BLOOD (ROUTINE X 2)     Status: None   Collection Time    03/23/14  3:06 AM      Result Value Ref Range Status   Specimen Description BLOOD RIGHT HAND   Final   Special Requests BOTTLES DRAWN AEROBIC AND ANAEROBIC 5CC   Final   Culture  Setup Time     Final   Value: 03/23/2014 08:47     Performed at [facility]   Culture     Final   Value:        BLOOD CULTURE RECEIVED NO GROWTH TO DATE CULTURE WILL BE HELD FOR 5 DAYS BEFORE ISSUING A FINAL NEGATIVE REPORT     Performed at [facility]   Report Status PENDING   Incomplete  AFB CULTURE WITH SMEAR     Status: None   Collection Time    03/23/14 12:00 PM      Result Value Ref Range Status   Specimen Description STOOL   Final   Special Requests NONE   Final   Acid Fast Smear     Final   Value: NO ACID FAST BACILLI SEEN     Performed at [facility]   Culture     Final   Value: CULTURE WILL BE EXAMINED FOR 6 WEEKS BEFORE ISSUING A FINAL REPORT     Performed at [facility]   Report Status PENDING   Incomplete  CLOSTRIDIUM DIFFICILE BY PCR     Status: None   Collection Time    03/24/14  5:24 AM      Result Value Ref Range Status   C difficile by pcr NEGATIVE  NEGATIVE Final  CULTURE, EXPECTORATED SPUTUM-ASSESSMENT     Status: None   Collection  Time    03/24/14  5:24 AM      Result Value Ref Range Status   Specimen Description SPUTUM   Final   Special Requests NONE   Final   Sputum evaluation     Final   Value: MICROSCOPIC FINDINGS SUGGEST THAT THIS SPECIMEN IS NOT REPRESENTATIVE OF LOWER RESPIRATORY SECRETIONS. PLEASE RECOLLECT.     RESULT CALLED TO, READ BACK BY AND VERIFIED WITHWilson Singer RN [phone] GREEN R   Report Status 03/24/2014 FINAL   Final    Studies/Results: Dg Chest Portable 1 View  03/27/2014   CLINICAL DATA:  Respiratory failure.  EXAM: PORTABLE CHEST - 1 VIEW  COMPARISON:  03/25/2014.  FINDINGS: The heart is mildly enlarged but stable. Stable surgical changes from triple bypass surgery. Persistent asymmetric airspace process, most likely bilateral infiltrates. A small left pleural effusion is suspected.  IMPRESSION: Persistent asymmetric airspace process, likely bilateral infiltrates.   Electronically Signed   By: Kalman Jewels M.D.   On: 03/27/2014 07:31    Assessment/Plan: 1)  Acute pancreatitis - no known etiology.  MRCP when able. Will change ARVs to exclude NRTIs.    2) Infiltrates on CXR - not hypoxic, no fever, no leukocytosis. No evidence of pneumonia at this time.    3) HIV - will restart new ARV regimen after discharge.    Scharlene Gloss, [place] [facility] [place] [email or web address] [phone] pager   [phone] cell 03/27/2014, 4:38 PM

## 2014-03-27 NOTE — Progress Notes (Signed)
Pt co stomach pain not nausea or vomiting nothing ordered for pain.  Paged Dr. Tyrell Antonio for something for pain.

## 2014-03-27 NOTE — Progress Notes (Signed)
Patient ID: KAITLIN ARDITO, male   DOB: 05-01-1946, 68 y.o.   MRN: [id number]  [facility] Progress Note    Assessment/ Plan:   1. ARF:  Appears to be to injury from volume depletion in the face of ongoing RAS blocker/diuretic and also from pancreatitis/SIRS. Creatinine higher and no evident renal recovery yet at this time and urine output incompletely charted, no acute dialysis needs noted at this time.  2. Metabolic Acidosis: Improved with hemodialysis as well as holding metformin/intravenous sodium bicarbonate. Continue to monitor closely for need to start oral sodium bicarbonate therapy. 3. Pancreatitis- clinically improving and now started on clear liquids that he is tolerating without abdominal pain 4. Hyponatremia- improved with dialysis/urine output 5. Hypoxia- doing well on minimal oxygen supplementation. now with asymmetrical air space disease but improved with UF on HD. 6. HIV- per ID 7. HTN- stable  Subjective:   Reports to be comfortable this morning, does not voice any acute complaints    Objective:   BP 169/77  Pulse 72  Temp(Src) 99.1 F (37.3 C) (Oral)  Resp 18  Ht 5\' 3"  (1.6 m)  Wt 54.795 kg (120 lb 12.8 oz)  BMI 21.40 kg/m2  SpO2 96%  Intake/Output Summary (Last 24 hours) at 03/27/14 0919 Last data filed at 03/26/14 1817  Gross per 24 hour  Intake    770 ml  Output      0 ml  Net    770 ml   Weight change: -3.105 kg (-6 lb 13.5 oz)  Physical Exam: Gen: Comfortably resting in bed, eating breakfast CVS: Pulse regular in rate and rhythm, S1 and S2 normal Resp: Coarse breath sounds bilaterally, no rales/wheeze Abd: Soft, flat, mildly tender over the epigastric area Ext: Trace lower extremity edema  Imaging: Dg Chest Portable 1 View  03/27/2014   CLINICAL DATA:  Respiratory failure.  EXAM: PORTABLE CHEST - 1 VIEW  COMPARISON:  03/25/2014.  FINDINGS: The heart is mildly enlarged but stable. Stable surgical changes from triple bypass surgery.  Persistent asymmetric airspace process, most likely bilateral infiltrates. A small left pleural effusion is suspected.  IMPRESSION: Persistent asymmetric airspace process, likely bilateral infiltrates.   Electronically Signed   By: Kalman Jewels M.D.   On: 03/27/2014 07:31    Labs: BMET  Recent Labs Lab 03/23/14 0300  03/24/14 0420 03/24/14 1020 03/24/14 1600 03/24/14 2220 03/25/14 0411 03/25/14 1020 03/26/14 0350 03/27/14 0521  NA 128*  < > 134* 135* 135* 137 139 139 139 136*  K 5.5*  < > 4.0 4.1 3.9 4.1 3.7 3.9 3.6* 3.7  CL 94*  < > 93* 94* 92* 90* 92* 93* 96 96  CO2 7*  < > 13* 16* 16* 14* 16* 19 18* 19  GLUCOSE 134*  < > 139* 118* 80 125* 146* 88 120* 158*  BUN 65*  < > 74* 77* 77* 83* 39* 47* 35* 58*  CREATININE 6.80*  < > 7.50* 7.57* 7.65* 8.10* 4.68* 5.48* 4.26* 6.04*  CALCIUM 7.6*  < > 6.2* 6.1* 6.5* 6.3* 7.2* 7.1* 7.5* 7.8*  PHOS 8.8*  --  8.9*  --   --   --   --   --   --   --   < > = values in this interval not displayed. CBC  Recent Labs Lab 03/22/14 2111 03/23/14 0300 03/24/14 0420 03/25/14 0411 03/27/14 0521  WBC 15.8* 15.1* 9.7 9.9 4.2  NEUTROABS 13.7*  --  8.7* 8.9*  --  HGB 12.8* 12.4* 11.5* 11.0* 9.5*  HCT 35.7* 35.6* 31.9* 29.8* 26.5*  MCV 96.7 97.8 93.8 92.8 95.7  PLT 164 133* 74* 89* 93*   Medications:    . antiseptic oral rinse  7 mL Mouth Rinse q12n4p  . chlorhexidine  15 mL Mouth Rinse BID  . insulin aspart  0-15 Units Subcutaneous TID WC  . insulin aspart  0-5 Units Subcutaneous QHS  . pantoprazole (PROTONIX) IV  40 mg Intravenous QHS   Elmarie Shiley, MD 03/27/2014, 9:19 AM

## 2014-03-27 NOTE — Progress Notes (Signed)
  [facility] Progress Note    Since last GI note: He has no abdominal pain.  Only mild nausea.  Tolerating clear liquids.  Objective: Vital signs in last 24 hours: Temp:  [98.3 F (36.8 C)-99.5 F (37.5 C)] 98.6 F (37 C) (08/24 0900) Pulse Rate:  [72-78] 78 (08/24 0900) Resp:  [18-20] 18 (08/24 0900) BP: (125-169)/(65-77) 162/73 mmHg (08/24 0900) SpO2:  [94 %-98 %] 94 % (08/24 0900) Weight:  [120 lb 12.8 oz (54.795 kg)] 120 lb 12.8 oz (54.795 kg) (08/23 2107) Last BM Date: 03/27/14 General: alert and oriented times 3 Heart: regular rate and rythm Abdomen: soft, non-tender, non-distended, normal bowel sounds   Lab Results:  Recent Labs  03/25/14 0411 03/27/14 0521  WBC 9.9 4.2  HGB 11.0* 9.5*  PLT 89* 93*  MCV 92.8 95.7    Recent Labs  03/25/14 1020 03/26/14 0350 03/27/14 0521  NA 139 139 136*  K 3.9 3.6* 3.7  CL 93* 96 96  CO2 19 18* 19  GLUCOSE 88 120* 158*  BUN 47* 35* 58*  CREATININE 5.48* 4.26* 6.04*  CALCIUM 7.1* 7.5* 7.8*    Recent Labs  03/25/14 0411 03/27/14 0521  PROT 5.3* 5.4*  ALBUMIN 2.4* 2.2*  AST 31 21  ALT 31 14  ALKPHOS 54 60  BILITOT 0.5 0.5    Medications: Scheduled Meds: . antiseptic oral rinse  7 mL Mouth Rinse q12n4p  . chlorhexidine  15 mL Mouth Rinse BID  . insulin aspart  0-15 Units Subcutaneous TID WC  . insulin aspart  0-5 Units Subcutaneous QHS  . pantoprazole (PROTONIX) IV  40 mg Intravenous QHS   Continuous Infusions: . sodium chloride Stopped (03/26/14 1330)   PRN Meds:.sodium chloride, sodium chloride, feeding supplement (NEPRO CARB STEADY), heparin, ondansetron (ZOFRAN) IV    Assessment/Plan: 68 y.o. male with ARF, elevated amylase, lipase (?acute pancreatitis)  It is very unusual to have acute pancreatitis without significant abdominal pains OR radiologic findings of pancreatitis. He has neither and I am not sure that he truly has pancreatic inflammation.    Previous HIV meds were  changed to stribild which contains elvitegravir (per epocrates drug index this can cause amylase, lipase elevation), tenofovir (can cause pancreatitis), cobicistat and emtricitabine.  HIs CR was 7 (from normal a month prior), renal failure can also cause elevations in amylase, lipase.  For now, I will advance his diet to low fat solids and will observe him clinically.    Milus Banister, MD  03/27/2014, 12:28 PM [facility] Pager [phone]

## 2014-03-27 NOTE — Progress Notes (Signed)
[facility] PROGRESS NOTE  KAYDON HUSBY FXT:[id number] DOB: 1946/08/03 DOA: 03/22/2014 PCP: Penni Homans, MD  Assessment/Plan: 68 year old male with HIV on STRIBILD presented to [facility] c/o emesis, diarrhea, and generalized weakness x 3 days . In ED found to be in renal failure with elevated lipase. Transferred to [facility] for ICU admission and PCCM eval.   1- Acute pancreatitis, possibly 2nd to STRIBILD (hx of this with other ARV meds)  Unclear etiology, abd [place] > no obvious pancreatic abnormality, similar dilated CBD compared with 4/'15 GI following.  Patient diet advance today.  Per GI unusual presentation of pancreatitis without significant abdominal pain, or radiographic finding. HIV medications can cause elevation of amylase and lipase.   2-Acute on chronic respiratory failure: Uncompensated met acidosis pulm edema? Vs pancreatitis induced ALI -Repeated chest x ray: 8-24: Persistent asymmetric airspace process, likely bilateral infiltrates. Will defer to  ID ordering  antibioticsPatient  Afebrile, no leukocytosis.  -Had fluids removed with dialysis, 2.4 l .   3-Acute renal failure, oliguric , High AG metabolic acidosis (suspect lactic, uremic) - slow progress Thought to be secondary to volume depletion, diuretics, SIRS.  Had dialysis 8/21 and 8/22. No further dialysis for now per renal.   Thrombocytopenia: improving.   HIV: Holding HIV medications. Dr Megan Salon helping with patient care.   COPD without evidence of exacerbation.   Hypertension: Holding HCTZ, lisinopril,  norvasc. BP increasing will resume atenolol.   Acute encephalopathy in setting of acidosis - resolved  Hyperkalemia - resolved  Code Status: Full Code.  Family Communication: Care discussed with patient.  Disposition Plan: remain inpatient.    Consultants:  Renal  ID  GI.   Procedures: 8/20 [place] abdo>>>Visualized pancreas has a normal sonographic appearance. No biliary sludge or gallstones. Upper  limits of normal to mildly dilated CBD, the similar to the abdomen ultrasound in April. No intrahepatic biliary ductal dilatation identified  8/21 TTE > Mild LVH, EF 60%, PAP 47mHg   Antibiotics:  none  HPI/Subjective: No significant abdominal pain, still with diarrhea. Has had diarrhea for last 6 months.  Has some cough, no significant.   Objective: Filed Vitals:   03/27/14 0900  BP: 162/73  Pulse: 78  Temp: 98.6 F (37 C)  Resp: 18    Intake/Output Summary (Last 24 hours) at 03/27/14 1507 Last data filed at 03/27/14 0900  Gross per 24 hour  Intake    480 ml  Output      0 ml  Net    480 ml   Filed Weights   03/25/14 1625 03/26/14 0500 03/26/14 2107  Weight: 55.6 kg (122 lb 9.2 oz) 56 kg (123 lb 7.3 oz) 54.795 kg (120 lb 12.8 oz)    Exam:   General:  Alert in no distress.   Cardiovascular: S 1, S 2 RRR  Respiratory: CTA  Abdomen: BS present, soft, NT  Musculoskeletal: no edema.   Data Reviewed: Basic Metabolic Panel:  Recent Labs Lab 03/23/14 0300  03/24/14 0420  03/24/14 2220 03/25/14 0411 03/25/14 1020 03/26/14 0350 03/27/14 0521  NA 128*  < > 134*  < > 137 139 139 139 136*  K 5.5*  < > 4.0  < > 4.1 3.7 3.9 3.6* 3.7  CL 94*  < > 93*  < > 90* 92* 93* 96 96  CO2 7*  < > 13*  < > 14* 16* 19 18* 19  GLUCOSE 134*  < > 139*  < > 125* 146* 88 120*  158*  BUN 65*  < > 74*  < > 83* 39* 47* 35* 58*  CREATININE 6.80*  < > 7.50*  < > 8.10* 4.68* 5.48* 4.26* 6.04*  CALCIUM 7.6*  < > 6.2*  < > 6.3* 7.2* 7.1* 7.5* 7.8*  MG 2.1  --  1.7  --   --   --   --   --   --   PHOS 8.8*  --  8.9*  --   --   --   --   --   --   < > = values in this interval not displayed. Liver Function Tests:  Recent Labs Lab 03/22/14 2111 03/22/14 2230 03/24/14 0420 03/25/14 0411 03/27/14 0521  AST 35 50* 42* 31 21  ALT 23 32 43 31 14  ALKPHOS 56 50 44 54 60  BILITOT 0.6 0.7 0.3 0.5 0.5  PROT 7.5 6.7 4.9* 5.3* 5.4*  ALBUMIN 3.7 3.3* 2.3* 2.4* 2.2*    Recent Labs Lab  03/22/14 2111 03/23/14 1100 03/25/14 0411 03/26/14 0350 03/27/14 0521  LIPASE >3000* 2024* 250* 542* 1420*  AMYLASE  --  1822*  --   --  847*   No results found for this basename: AMMONIA,  in the last 168 hours CBC:  Recent Labs Lab 03/22/14 2111 03/23/14 0300 03/24/14 0420 03/25/14 0411 03/27/14 0521  WBC 15.8* 15.1* 9.7 9.9 4.2  NEUTROABS 13.7*  --  8.7* 8.9*  --   HGB 12.8* 12.4* 11.5* 11.0* 9.5*  HCT 35.7* 35.6* 31.9* 29.8* 26.5*  MCV 96.7 97.8 93.8 92.8 95.7  PLT 164 133* 74* 89* 93*   Cardiac Enzymes:  Recent Labs Lab 03/23/14 0008 03/23/14 0300 03/23/14 0826 03/23/14 1911  CKTOTAL 345*  --   --   --   TROPONINI  --  <0.30 <0.30 <0.30   BNP (last 3 results) No results found for this basename: PROBNP,  in the last 8760 hours CBG:  Recent Labs Lab 03/26/14 0712 03/26/14 1104 03/26/14 1641 03/26/14 2106 03/27/14 1152  GLUCAP 128* 247* 149* 147* 207*    Recent Results (from the past 240 hour(s))  MRSA PCR SCREENING     Status: None   Collection Time    03/23/14  1:50 AM      Result Value Ref Range Status   MRSA by PCR NEGATIVE  NEGATIVE Final   Comment:            The GeneXpert MRSA Assay (FDA     approved for NASAL specimens     only), is one component of a     comprehensive MRSA colonization     surveillance program. It is not     intended to diagnose MRSA     infection nor to guide or     monitor treatment for     MRSA infections.  URINE CULTURE     Status: None   Collection Time    03/23/14  2:56 AM      Result Value Ref Range Status   Specimen Description URINE, CATHETERIZED   Final   Special Requests NONE   Final   Culture  Setup Time     Final   Value: 03/23/2014 09:02     Performed at [place] Count     Final   Value: NO GROWTH     Performed at [facility]   Culture     Final   Value: NO GROWTH  Performed at [facility]   Report Status 03/24/2014 FINAL   Final  CULTURE, BLOOD  (ROUTINE X 2)     Status: None   Collection Time    03/23/14  3:00 AM      Result Value Ref Range Status   Specimen Description BLOOD LEFT ARM   Final   Special Requests BOTTLES DRAWN AEROBIC AND ANAEROBIC 10CC   Final   Culture  Setup Time     Final   Value: 03/23/2014 08:48     Performed at [facility]   Culture     Final   Value:        BLOOD CULTURE RECEIVED NO GROWTH TO DATE CULTURE WILL BE HELD FOR 5 DAYS BEFORE ISSUING A FINAL NEGATIVE REPORT     Performed at [facility]   Report Status PENDING   Incomplete  CULTURE, BLOOD (ROUTINE X 2)     Status: None   Collection Time    03/23/14  3:06 AM      Result Value Ref Range Status   Specimen Description BLOOD RIGHT HAND   Final   Special Requests BOTTLES DRAWN AEROBIC AND ANAEROBIC 5CC   Final   Culture  Setup Time     Final   Value: 03/23/2014 08:47     Performed at [facility]   Culture     Final   Value:        BLOOD CULTURE RECEIVED NO GROWTH TO DATE CULTURE WILL BE HELD FOR 5 DAYS BEFORE ISSUING A FINAL NEGATIVE REPORT     Performed at [facility]   Report Status PENDING   Incomplete  AFB CULTURE WITH SMEAR     Status: None   Collection Time    03/23/14 12:00 PM      Result Value Ref Range Status   Specimen Description STOOL   Final   Special Requests NONE   Final   Acid Fast Smear     Final   Value: NO ACID FAST BACILLI SEEN     Performed at [facility]   Culture     Final   Value: CULTURE WILL BE EXAMINED FOR 6 WEEKS BEFORE ISSUING A FINAL REPORT     Performed at [facility]   Report Status PENDING   Incomplete  CLOSTRIDIUM DIFFICILE BY PCR     Status: None   Collection Time    03/24/14  5:24 AM      Result Value Ref Range Status   C difficile by pcr NEGATIVE  NEGATIVE Final  CULTURE, EXPECTORATED SPUTUM-ASSESSMENT     Status: None   Collection Time    03/24/14  5:24 AM      Result Value Ref Range Status   Specimen Description SPUTUM   Final   Special  Requests NONE   Final   Sputum evaluation     Final   Value: MICROSCOPIC FINDINGS SUGGEST THAT THIS SPECIMEN IS NOT REPRESENTATIVE OF LOWER RESPIRATORY SECRETIONS. PLEASE RECOLLECT.     RESULT CALLED TO, READ BACK BY AND VERIFIED WITHWilson Singer RN [id number] [phone] GREEN R   Report Status 03/24/2014 FINAL   Final     Studies: Dg Chest Portable 1 View  03/27/2014   CLINICAL DATA:  Respiratory failure.  EXAM: PORTABLE CHEST - 1 VIEW  COMPARISON:  03/25/2014.  FINDINGS: The heart is mildly enlarged but stable. Stable surgical changes from triple bypass surgery. Persistent asymmetric airspace process, most likely bilateral  infiltrates. A small left pleural effusion is suspected.  IMPRESSION: Persistent asymmetric airspace process, likely bilateral infiltrates.   Electronically Signed   By: Kalman Jewels M.D.   On: 03/27/2014 07:31    Scheduled Meds: . antiseptic oral rinse  7 mL Mouth Rinse q12n4p  . chlorhexidine  15 mL Mouth Rinse BID  . insulin aspart  0-15 Units Subcutaneous TID WC  . insulin aspart  0-5 Units Subcutaneous QHS  . pantoprazole (PROTONIX) IV  40 mg Intravenous QHS   Continuous Infusions: . sodium chloride Stopped (03/26/14 1330)    Active Problems:   AKI (acute kidney injury)   Metabolic acidosis   Hyperkalemia   Hyponatremia   HIV disease   Acute gastroenteritis   Protein-calorie malnutrition, severe   Acute kidney injury    Time spent: 35 minutes.     Niel Hummer A  [facility] Pager [phone]. If 7PM-7AM, please contact night-coverage at [email or web address], password [facility] 03/27/2014, 3:07 PM  LOS: 5 days

## 2014-03-28 DIAGNOSIS — K859 Acute pancreatitis without necrosis or infection, unspecified: Secondary | ICD-10-CM

## 2014-03-28 DIAGNOSIS — R197 Diarrhea, unspecified: Secondary | ICD-10-CM

## 2014-03-28 LAB — COMPREHENSIVE METABOLIC PANEL
ALBUMIN: 2.2 g/dL — AB (ref 3.5–5.2)
ALT: 12 U/L (ref 0–53)
AST: 22 U/L (ref 0–37)
Alkaline Phosphatase: 61 U/L (ref 39–117)
Anion gap: 21 — ABNORMAL HIGH (ref 5–15)
BUN: 78 mg/dL — ABNORMAL HIGH (ref 6–23)
CALCIUM: 8.1 mg/dL — AB (ref 8.4–10.5)
CO2: 19 meq/L (ref 19–32)
CREATININE: 7.48 mg/dL — AB (ref 0.50–1.35)
Chloride: 96 mEq/L (ref 96–112)
GFR calc Af Amer: 8 mL/min — ABNORMAL LOW (ref >90)
GFR calc non Af Amer: 7 mL/min — ABNORMAL LOW (ref >90)
Glucose, Bld: 141 mg/dL — ABNORMAL HIGH (ref 70–99)
Potassium: 4.4 mEq/L (ref 3.7–5.3)
Sodium: 136 mEq/L — ABNORMAL LOW (ref 137–147)
TOTAL PROTEIN: 5.9 g/dL — AB (ref 6.0–8.3)
Total Bilirubin: 0.5 mg/dL (ref 0.3–1.2)

## 2014-03-28 LAB — AMYLASE: AMYLASE: 1050 U/L — AB (ref 0–105)

## 2014-03-28 LAB — LIPASE, BLOOD: Lipase: 1481 U/L — ABNORMAL HIGH (ref 11–59)

## 2014-03-28 LAB — GLUCOSE, CAPILLARY
GLUCOSE-CAPILLARY: 130 mg/dL — AB (ref 70–99)
GLUCOSE-CAPILLARY: 90 mg/dL (ref 70–99)
Glucose-Capillary: 139 mg/dL — ABNORMAL HIGH (ref 70–99)
Glucose-Capillary: 182 mg/dL — ABNORMAL HIGH (ref 70–99)

## 2014-03-28 MED ORDER — LOPERAMIDE HCL 2 MG PO CAPS
2.0000 mg | ORAL_CAPSULE | Freq: Three times a day (TID) | ORAL | Status: DC | PRN
  Administered 2014-03-31 – 2014-04-02 (×2): 2 mg via ORAL
  Filled 2014-03-28 (×3): qty 1

## 2014-03-28 MED ORDER — AMLODIPINE BESYLATE 5 MG PO TABS
5.0000 mg | ORAL_TABLET | Freq: Every day | ORAL | Status: DC
  Administered 2014-03-28: 5 mg via ORAL
  Filled 2014-03-28 (×3): qty 1

## 2014-03-28 NOTE — Progress Notes (Signed)
OT Cancellation Note  Patient Details Name: [name] MRN: [id number] DOB: 1945/10/06   Cancelled Treatment:    Reason Eval/Treat Not Completed: Medical issues which prohibited therapy  Orders received, chart reviewed; Noted temporary R femoral HD catheter still in place; We typically hold mobilizing with a non-tunneled femoral HD catheter due to risk of losing line; Will hold OT today, and be happy to proceed with OT eval once temporary femoral HD catheter is out;   Benito Mccreedy OTR/L [phone] 03/28/2014, 8:54 AM

## 2014-03-28 NOTE — Procedures (Signed)
Patient seen on Hemodialysis. QB 300, UF goal 0.5L Treatment adjusted as needed.  Elmarie Shiley MD [facility]. Office # [phone] Pager # [phone] 2:05 PM

## 2014-03-28 NOTE — Progress Notes (Signed)
PT Cancellation Note  Patient Details Name: [name] MRN: [id number] DOB: 1946/06/07   Cancelled Treatment:    Reason Eval/Treat Not Completed: Medical issues which prohibited therapy  Orders received, chart reviewed;  Noted temporary R femoral HD catheter still in place;  We typically hold mobilizing with a non-tunneled femoral HD catheter due to risk of losing line;  Will hold PT today, and be happy to proceed with PT eval once temporary femoral HD catheter is out;  Thank you,  Roney Marion, PT  [facility] Services Pager [phone] Office [phone]    Roney Marion [facility] 03/28/2014, 8:15 AM

## 2014-03-28 NOTE — Progress Notes (Signed)
Patient ID: [name], male   DOB: 1946/04/24, 68 y.o.   MRN: [id number]  [facility] Progress Note   Assessment/ Plan:   1. ARF:  Appears to be to injury from volume depletion in the face of ongoing RAS blocker/diuretic and also from pancreatitis/SIRS. Urine output is fair however renal function continues to deteriorate-plan to undertake hemodialysis today to provide clearance (no ultrafiltration)  2. Metabolic Acidosis: Improved with hemodialysis as well as holding metformin/intravenous sodium bicarbonate. Provide hemodialysis today. 3. Pancreatitis- clinically improving and now started on clear liquids that he is tolerating without abdominal pain (labs reveal elevated amylase/lipase) 4. Hyponatremia- improved with dialysis/urine output-will monitor this and decide on need for fluid restriction 5. Hypoxia- doing well on minimal oxygen supplementation. now with asymmetrical air space disease but improved with UF on HD. 6. HIV- per ID 7. HTN- elevated blood pressures noted-no ultrafiltration at dialysis, will reevaluate antihypertensive therapy  Subjective:   Reports to be feeling relatively well, denies any acute shortness of breath or chest pain. Still having intermittent abdominal pain and nausea    Objective:   BP 189/83  Pulse 58  Temp(Src) 98.3 F (36.8 C) (Oral)  Resp 20  Ht 5\' 3"  (1.6 m)  Wt 54.795 kg (120 lb 12.8 oz)  BMI 21.40 kg/m2  SpO2 97%  Intake/Output Summary (Last 24 hours) at 03/28/14 3557 Last data filed at 03/27/14 2037  Gross per 24 hour  Intake    240 ml  Output   1000 ml  Net   -760 ml   Weight change:   Physical Exam: Gen: Comfortably resting in bed, watching television CVS: Pulse regular bradycardia, S1 and S2 normal Resp: Coarse breath sounds bilaterally, no rales/wheeze Abd: Soft, flat, mildly tender over the epigastric area Ext: Trace lower extremity edema  Imaging: Dg Chest Portable 1 View  03/27/2014   CLINICAL DATA:   Respiratory failure.  EXAM: PORTABLE CHEST - 1 VIEW  COMPARISON:  03/25/2014.  FINDINGS: The heart is mildly enlarged but stable. Stable surgical changes from triple bypass surgery. Persistent asymmetric airspace process, most likely bilateral infiltrates. A small left pleural effusion is suspected.  IMPRESSION: Persistent asymmetric airspace process, likely bilateral infiltrates.   Electronically Signed   By: Kalman Jewels M.D.   On: 03/27/2014 07:31    Labs: BMET  Recent Labs Lab 03/23/14 0300  03/24/14 0420  03/24/14 1600 03/24/14 2220 03/25/14 0411 03/25/14 1020 03/26/14 0350 03/27/14 0521 03/28/14 0620  NA 128*  < > 134*  < > 135* 137 139 139 139 136* 136*  K 5.5*  < > 4.0  < > 3.9 4.1 3.7 3.9 3.6* 3.7 4.4  CL 94*  < > 93*  < > 92* 90* 92* 93* 96 96 96  CO2 7*  < > 13*  < > 16* 14* 16* 19 18* 19 19  GLUCOSE 134*  < > 139*  < > 80 125* 146* 88 120* 158* 141*  BUN 65*  < > 74*  < > 77* 83* 39* 47* 35* 58* 78*  CREATININE 6.80*  < > 7.50*  < > 7.65* 8.10* 4.68* 5.48* 4.26* 6.04* 7.48*  CALCIUM 7.6*  < > 6.2*  < > 6.5* 6.3* 7.2* 7.1* 7.5* 7.8* 8.1*  PHOS 8.8*  --  8.9*  --   --   --   --   --   --   --   --   < > = values in this interval not displayed.  CBC  Recent Labs Lab 03/22/14 2111 03/23/14 0300 03/24/14 0420 03/25/14 0411 03/27/14 0521  WBC 15.8* 15.1* 9.7 9.9 4.2  NEUTROABS 13.7*  --  8.7* 8.9*  --   HGB 12.8* 12.4* 11.5* 11.0* 9.5*  HCT 35.7* 35.6* 31.9* 29.8* 26.5*  MCV 96.7 97.8 93.8 92.8 95.7  PLT 164 133* 74* 89* 93*   Medications:    . antiseptic oral rinse  7 mL Mouth Rinse q12n4p  . atenolol  50 mg Oral Daily  . chlorhexidine  15 mL Mouth Rinse BID  . insulin aspart  0-15 Units Subcutaneous TID WC  . insulin aspart  0-5 Units Subcutaneous QHS  . pantoprazole (PROTONIX) IV  40 mg Intravenous QHS   Elmarie Shiley, MD 03/28/2014, 8:55 AM

## 2014-03-28 NOTE — Progress Notes (Signed)
[facility] Progress Note  Subjective:  Amylase back up to 1050 and lipase 1481 today.  Renal function is worse so is going to get dialysis again today.  Is tolerating diet but saying that he now has some abdominal pain that seems to come/worsen after eating.  No nausea or vomiting.  Objective:  Vital signs in last 24 hours: Temp:  [97.6 F (36.4 C)-98.6 F (37 C)] 97.6 F (36.4 C) (08/25 0923) Pulse Rate:  [58-65] 61 (08/25 0923) Resp:  [18-20] 18 (08/25 0923) BP: (171-189)/(75-83) 184/77 mmHg (08/25 0923) SpO2:  [97 %-99 %] 99 % (08/25 0923) Last BM Date: 03/28/14 General:  Alert, thin with temporal wasting, in NAD Heart:  Regular rate and rhythm; no murmurs Pulm:  CTAB.  No W/R/R. Abdomen:  Soft, non-distended.  BS present.  Mild diffuse TTP without R/R/G.   Extremities:  Without edema. Neurologic:  Alert and  oriented x4;  grossly normal neurologically. Psych:  Alert and cooperative. Normal mood and affect.  Intake/Output from previous day: 08/24 0701 - 08/25 0700 In: 240 [P.O.:240] Out: 1000 [Urine:1000]  Lab Results:  Recent Labs  03/27/14 0521  WBC 4.2  HGB 9.5*  HCT 26.5*  PLT 93*   BMET  Recent Labs  03/26/14 0350 03/27/14 0521 03/28/14 0620  NA 139 136* 136*  K 3.6* 3.7 4.4  CL 96 96 96  CO2 18* 19 19  GLUCOSE 120* 158* 141*  BUN 35* 58* 78*  CREATININE 4.26* 6.04* 7.48*  CALCIUM 7.5* 7.8* 8.1*   LFT  Recent Labs  03/28/14 0620  PROT 5.9*  ALBUMIN 2.2*  AST 22  ALT 12  ALKPHOS 61  BILITOT 0.5   Dg Chest Portable 1 View  03/27/2014   CLINICAL DATA:  Respiratory failure.  EXAM: PORTABLE CHEST - 1 VIEW  COMPARISON:  03/25/2014.  FINDINGS: The heart is mildly enlarged but stable. Stable surgical changes from triple bypass surgery. Persistent asymmetric airspace process, most likely bilateral infiltrates. A small left pleural effusion is suspected.  IMPRESSION: Persistent asymmetric airspace process, likely bilateral  infiltrates.   Electronically Signed   By: Kalman Jewels M.D.   On: 03/27/2014 07:31    Assessment / Plan: *68 y.o. male with ARF, elevated amylase/lipase (? acute pancreatitis).  It is very unusual to have acute pancreatitis without significant abdominal pains OR radiologic findings of pancreatitis.  Previous HIV meds were changed to stribild which contains elvitegravir (per epocrates drug index this can cause amylase, lipase elevation), tenofovir (can cause pancreatitis, nephrotoxicity), cobicistat and emtricitabine. His CR is worsening (from normal a month prior), renal failure can also cause elevations in amylase, lipase.  Amylase and lipase are higher even again today (? If this correlates with his worsening renal failure).  He is having some abdominal pain after eating now, but is tolerating the diet overall.  ? Need to repeat CT scan to recheck pancreas or just continue to watch him clinically.    LOS: 6 days   ZEHR, JESSICA D.  03/28/2014, 9:36 AM Pager number [phone]   ________________________________________________________________________  [facility] MD note:  I personally examined the patient, reviewed the data and agree with the assessment and plan described above.  HIV meds were previously blamed for pancreatitis 3-4 months ago.  New HIV regimen may cause pancreatitis and also lipase, amylase elevations without pancreatitis. Renal failure can cause amylase, lipase elevation.  Really not clear if he truly has pancreatitic inflammation. Clinically he doesn't appear to but he  is complaining of some mild abd pains now and so I think further imaging is reasonable.  CT/MRI would have to be non-contrast given ARF and I think MRCP, MRI without IV contrast may yield most information (can check for CBD stones, dilation and can comment on pancreatic, peripancreatic edema).  I will order.   Owens Loffler, MD [facility] Pager [phone]

## 2014-03-28 NOTE — Progress Notes (Signed)
[facility] PROGRESS NOTE  [name] HUD:[id number] DOB: 05/24/1946 DOA: 03/22/2014 PCP: Penni Homans, MD  Assessment/Plan: 68 year old male with HIV on STRIBILD presented to [place] c/o emesis, diarrhea, and generalized weakness x 3 days . In ED found to be in renal failure with elevated lipase. Transferred to [facility] for ICU admission. Patient subsequently transfer to [facility]. Patient admitted with respiratory failure, renal failure, and acute pancreatitis. He is undergoing intermittent dialysis. His respiratory failure has improved after volume removed with dialysis.  He is undergoing work up for pancreatitis. GI and renal helping with patient care.   1- Acute pancreatitis, possibly 2nd to STRIBILD (hx of this with other ARV meds)  Unclear etiology, abd [place] > no obvious pancreatic abnormality, similar dilated CBD compared with 4/'15 GI following.  Per GI unusual presentation of pancreatitis without significant abdominal pain, or radiographic finding. HIV medications can cause elevation of amylase and lipase.  MRI, MRCP ordered.   2-Acute on chronic respiratory failure: Uncompensated met acidosis pulm edema? Vs pancreatitis induced ALI -Repeated chest x ray: 8-24: Persistent asymmetric airspace process, likely bilateral infiltrates. No need for antibiotics per ID.  -Had fluids removed with dialysis, 2.4 l .   3-Acute renal failure, oliguric , High AG metabolic acidosis (suspect lactic, uremic) - slow progress Thought to be secondary to volume depletion, diuretics, SIRS.  Had dialysis 8/21 and 8/22. Renal planning repeating dialysis today 8-25.   Chronic Diarrhea; for lats 6 months. C diff was negative.   Thrombocytopenia: improving. Repeat labs tomorrow.   HIV: Holding HIV medications. Dr Megan Salon helping with patient care.   COPD without evidence of exacerbation.   Hypertension: Holding HCTZ, lisinopril, BP increasing will resume atenolol. Will resume Norvasc today.    Acute encephalopathy in setting of acidosis - resolved  Hyperkalemia - resolved  Code Status: Full Code.  Family Communication: Care discussed with patient.  Disposition Plan: remain inpatient.    Consultants:  Renal  ID  GI.   Procedures: 8/20 [place] abdo>>>Visualized pancreas has a normal sonographic appearance. No biliary sludge or gallstones. Upper limits of normal to mildly dilated CBD, the similar to the abdomen ultrasound in April. No intrahepatic biliary ductal dilatation identified  8/21 TTE > Mild LVH, EF 60%, PAP 9mHg   Antibiotics:  none  HPI/Subjective: He report some abdominal pain after eating. Diarrhea better.   Objective: Filed Vitals:   03/28/14 0923  BP: 184/77  Pulse: 61  Temp: 97.6 F (36.4 C)  Resp: 18    Intake/Output Summary (Last 24 hours) at 03/28/14 1316 Last data filed at 03/27/14 2037  Gross per 24 hour  Intake      0 ml  Output   1000 ml  Net  -1000 ml   Filed Weights   03/26/14 0500 03/26/14 2107 03/28/14 0941  Weight: 56 kg (123 lb 7.3 oz) 54.795 kg (120 lb 12.8 oz) 51.438 kg (113 lb 6.4 oz)    Exam:   General:  Alert in no distress.   Cardiovascular: S 1, S 2 RRR  Respiratory: CTA  Abdomen: BS present, soft, NT  Musculoskeletal: no edema.   Data Reviewed: Basic Metabolic Panel:  Recent Labs Lab 03/23/14 0300  03/24/14 0420  03/25/14 0411 03/25/14 1020 03/26/14 0350 03/27/14 0521 03/28/14 0620  NA 128*  < > 134*  < > 139 139 139 136* 136*  K 5.5*  < > 4.0  < > 3.7 3.9 3.6* 3.7 4.4  CL 94*  < >  93*  < > 92* 93* 96 96 96  CO2 7*  < > 13*  < > 16* 19 18* 19 19  GLUCOSE 134*  < > 139*  < > 146* 88 120* 158* 141*  BUN 65*  < > 74*  < > 39* 47* 35* 58* 78*  CREATININE 6.80*  < > 7.50*  < > 4.68* 5.48* 4.26* 6.04* 7.48*  CALCIUM 7.6*  < > 6.2*  < > 7.2* 7.1* 7.5* 7.8* 8.1*  MG 2.1  --  1.7  --   --   --   --   --   --   PHOS 8.8*  --  8.9*  --   --   --   --   --   --   < > = values in this interval not  displayed. Liver Function Tests:  Recent Labs Lab 03/22/14 2230 03/24/14 0420 03/25/14 0411 03/27/14 0521 03/28/14 0620  AST 50* 42* '31 21 22  ' ALT 32 43 '31 14 12  ' ALKPHOS 50 44 54 60 61  BILITOT 0.7 0.3 0.5 0.5 0.5  PROT 6.7 4.9* 5.3* 5.4* 5.9*  ALBUMIN 3.3* 2.3* 2.4* 2.2* 2.2*    Recent Labs Lab 03/23/14 1100 03/25/14 0411 03/26/14 0350 03/27/14 0521 03/28/14 0620  LIPASE 2024* 250* 542* 1420* 1481*  AMYLASE 1822*  --   --  847* 1050*   No results found for this basename: AMMONIA,  in the last 168 hours CBC:  Recent Labs Lab 03/22/14 2111 03/23/14 0300 03/24/14 0420 03/25/14 0411 03/27/14 0521  WBC 15.8* 15.1* 9.7 9.9 4.2  NEUTROABS 13.7*  --  8.7* 8.9*  --   HGB 12.8* 12.4* 11.5* 11.0* 9.5*  HCT 35.7* 35.6* 31.9* 29.8* 26.5*  MCV 96.7 97.8 93.8 92.8 95.7  PLT 164 133* 74* 89* 93*   Cardiac Enzymes:  Recent Labs Lab 03/23/14 0008 03/23/14 0300 03/23/14 0826 03/23/14 1911  CKTOTAL 345*  --   --   --   TROPONINI  --  <0.30 <0.30 <0.30   BNP (last 3 results) No results found for this basename: PROBNP,  in the last 8760 hours CBG:  Recent Labs Lab 03/27/14 1152 03/27/14 1744 03/27/14 2111 03/28/14 0756 03/28/14 1131  GLUCAP 207* 140* 143* 130* 139*    Recent Results (from the past 240 hour(s))  MRSA PCR SCREENING     Status: None   Collection Time    03/23/14  1:50 AM      Result Value Ref Range Status   MRSA by PCR NEGATIVE  NEGATIVE Final   Comment:            The GeneXpert MRSA Assay (FDA     approved for NASAL specimens     only), is one component of a     comprehensive MRSA colonization     surveillance program. It is not     intended to diagnose MRSA     infection nor to guide or     monitor treatment for     MRSA infections.  URINE CULTURE     Status: None   Collection Time    03/23/14  2:56 AM      Result Value Ref Range Status   Specimen Description URINE, CATHETERIZED   Final   Special Requests NONE   Final   Culture   Setup Time     Final   Value: 03/23/2014 09:02     Performed at [facility]  Count     Final   Value: NO GROWTH     Performed at [facility]   Culture     Final   Value: NO GROWTH     Performed at [facility]   Report Status 03/24/2014 FINAL   Final  CULTURE, BLOOD (ROUTINE X 2)     Status: None   Collection Time    03/23/14  3:00 AM      Result Value Ref Range Status   Specimen Description BLOOD LEFT ARM   Final   Special Requests BOTTLES DRAWN AEROBIC AND ANAEROBIC 10CC   Final   Culture  Setup Time     Final   Value: 03/23/2014 08:48     Performed at [facility]   Culture     Final   Value:        BLOOD CULTURE RECEIVED NO GROWTH TO DATE CULTURE WILL BE HELD FOR 5 DAYS BEFORE ISSUING A FINAL NEGATIVE REPORT     Performed at [facility]   Report Status PENDING   Incomplete  CULTURE, BLOOD (ROUTINE X 2)     Status: None   Collection Time    03/23/14  3:06 AM      Result Value Ref Range Status   Specimen Description BLOOD RIGHT HAND   Final   Special Requests BOTTLES DRAWN AEROBIC AND ANAEROBIC 5CC   Final   Culture  Setup Time     Final   Value: 03/23/2014 08:47     Performed at [facility]   Culture     Final   Value:        BLOOD CULTURE RECEIVED NO GROWTH TO DATE CULTURE WILL BE HELD FOR 5 DAYS BEFORE ISSUING A FINAL NEGATIVE REPORT     Performed at [facility]   Report Status PENDING   Incomplete  AFB CULTURE WITH SMEAR     Status: None   Collection Time    03/23/14 12:00 PM      Result Value Ref Range Status   Specimen Description STOOL   Final   Special Requests NONE   Final   Acid Fast Smear     Final   Value: NO ACID FAST BACILLI SEEN     Performed at [facility]   Culture     Final   Value: CULTURE WILL BE EXAMINED FOR 6 WEEKS BEFORE ISSUING A FINAL REPORT     Performed at [facility]   Report Status PENDING   Incomplete  CLOSTRIDIUM DIFFICILE BY PCR     Status: None    Collection Time    03/24/14  5:24 AM      Result Value Ref Range Status   C difficile by pcr NEGATIVE  NEGATIVE Final  CULTURE, EXPECTORATED SPUTUM-ASSESSMENT     Status: None   Collection Time    03/24/14  5:24 AM      Result Value Ref Range Status   Specimen Description SPUTUM   Final   Special Requests NONE   Final   Sputum evaluation     Final   Value: MICROSCOPIC FINDINGS SUGGEST THAT THIS SPECIMEN IS NOT REPRESENTATIVE OF LOWER RESPIRATORY SECRETIONS. PLEASE RECOLLECT.     RESULT CALLED TO, READ BACK BY AND VERIFIED WITHWilson Singer RN [phone] GREEN R   Report Status 03/24/2014 FINAL   Final     Studies: Dg Chest Portable 1 View  03/27/2014   CLINICAL DATA:  Respiratory failure.  EXAM: PORTABLE CHEST - 1 VIEW  COMPARISON:  03/25/2014.  FINDINGS: The heart is mildly enlarged but stable. Stable surgical changes from triple bypass surgery. Persistent asymmetric airspace process, most likely bilateral infiltrates. A small left pleural effusion is suspected.  IMPRESSION: Persistent asymmetric airspace process, likely bilateral infiltrates.   Electronically Signed   By: Kalman Jewels M.D.   On: 03/27/2014 07:31    Scheduled Meds: . amLODipine  5 mg Oral QHS  . antiseptic oral rinse  7 mL Mouth Rinse q12n4p  . atenolol  50 mg Oral Daily  . chlorhexidine  15 mL Mouth Rinse BID  . insulin aspart  0-15 Units Subcutaneous TID WC  . insulin aspart  0-5 Units Subcutaneous QHS  . pantoprazole (PROTONIX) IV  40 mg Intravenous QHS   Continuous Infusions: . sodium chloride Stopped (03/26/14 1330)    Active Problems:   AKI (acute kidney injury)   Metabolic acidosis   Hyperkalemia   Hyponatremia   HIV disease   Acute gastroenteritis   Protein-calorie malnutrition, severe   Acute kidney injury    Time spent: 30 minutes.     Niel Hummer A  [facility] Pager [phone]. If 7PM-7AM, please contact night-coverage at [email or web address], password [name] 03/28/2014, 1:16 PM   LOS: 6 days

## 2014-03-28 NOTE — Progress Notes (Signed)
Patient requesting imodium.  Has had 2 loose bowel movements today. Patient stated he takes it at home.  MD notified.

## 2014-03-29 ENCOUNTER — Inpatient Hospital Stay (HOSPITAL_COMMUNITY): Payer: Medicare Other

## 2014-03-29 DIAGNOSIS — K859 Acute pancreatitis without necrosis or infection, unspecified: Secondary | ICD-10-CM

## 2014-03-29 DIAGNOSIS — R197 Diarrhea, unspecified: Secondary | ICD-10-CM

## 2014-03-29 LAB — CBC
HCT: 29.5 % — ABNORMAL LOW (ref 39.0–52.0)
Hemoglobin: 10.1 g/dL — ABNORMAL LOW (ref 13.0–17.0)
MCH: 31.8 pg (ref 26.0–34.0)
MCHC: 34.2 g/dL (ref 30.0–36.0)
MCV: 92.8 fL (ref 78.0–100.0)
Platelets: 103 10*3/uL — ABNORMAL LOW (ref 150–400)
RBC: 3.18 MIL/uL — ABNORMAL LOW (ref 4.22–5.81)
RDW: 13.3 % (ref 11.5–15.5)
WBC: 11.6 10*3/uL — AB (ref 4.0–10.5)

## 2014-03-29 LAB — CULTURE, BLOOD (ROUTINE X 2)
Culture: NO GROWTH
Culture: NO GROWTH

## 2014-03-29 LAB — GLUCOSE, CAPILLARY
GLUCOSE-CAPILLARY: 131 mg/dL — AB (ref 70–99)
Glucose-Capillary: 133 mg/dL — ABNORMAL HIGH (ref 70–99)

## 2014-03-29 MED ORDER — HEPARIN SODIUM (PORCINE) 1000 UNIT/ML DIALYSIS: 3000.0000 [IU] | INTRAMUSCULAR | Status: DC | PRN

## 2014-03-29 MED ORDER — AMLODIPINE BESYLATE 10 MG PO TABS
10.0000 mg | ORAL_TABLET | Freq: Every day | ORAL | Status: DC
  Administered 2014-03-29 – 2014-04-09 (×12): 10 mg via ORAL
  Filled 2014-03-29 (×13): qty 1

## 2014-03-29 NOTE — Progress Notes (Signed)
Pt NPO sitting up to chair.

## 2014-03-29 NOTE — Progress Notes (Signed)
Patient ID: MANJINDER BREAU, male   DOB: 1946/02/03, 68 y.o.   MRN: [id number]  [facility] Progress Note   Assessment/ Plan:   1. ARF:  Continues to have good urine output and underwent dialysis yesterday for clearance. Continue daily monitoring of labs for renal recovery with dialysis as needed for acute needs. May be able to discontinue Foley catheter tomorrow as he is able to ambulate and comprehend the importance of strict I/O. 2. Metabolic Acidosis: Improved with dialysis, no labs available from this morning 3. Pancreatitis- clinically improving and now started on clear liquids that he is tolerating without abdominal pain (labs reveal elevated amylase/lipase) 4. Hyponatremia- continue to monitor with labs-difficult to advise for fluid restriction when he is on clear liquids and recovering from acute renal failure 5. Hypoxia- doing well on minimal oxygen supplementation. now with asymmetrical air space disease but improved with UF on HD. 6. HIV- per ID 7. HTN- blood pressure slightly elevated, we'll reevaluate antihypertensive therapy  Subjective:   Reports to be feeling fair-attempting to eat and drink to the best of his ability with minimal abdominal pain    Objective:   BP 166/77  Pulse 63  Temp(Src) 98.2 F (36.8 C) (Oral)  Resp 16  Ht 5\' 3"  (1.6 m)  Wt 52.753 kg (116 lb 4.8 oz)  BMI 20.61 kg/m2  SpO2 97%  Intake/Output Summary (Last 24 hours) at 03/29/14 2706 Last data filed at 03/29/14 0507  Gross per 24 hour  Intake      0 ml  Output   1750 ml  Net  -1750 ml   Weight change:   Physical Exam: Gen: Comfortably resting in bed, watching television CVS: Pulse regular bradycardia, S1 and S2 normal Resp: Coarse breath sounds bilaterally, no rales/wheeze Abd: Soft, flat, mildly tender over the epigastric area Ext: Trace lower extremity edema  Imaging: No results found.  Labs: BMET  Recent Labs Lab 03/23/14 0300  03/24/14 0420  03/24/14 1600  03/24/14 2220 03/25/14 0411 03/25/14 1020 03/26/14 0350 03/27/14 0521 03/28/14 0620  NA 128*  < > 134*  < > 135* 137 139 139 139 136* 136*  K 5.5*  < > 4.0  < > 3.9 4.1 3.7 3.9 3.6* 3.7 4.4  CL 94*  < > 93*  < > 92* 90* 92* 93* 96 96 96  CO2 7*  < > 13*  < > 16* 14* 16* 19 18* 19 19  GLUCOSE 134*  < > 139*  < > 80 125* 146* 88 120* 158* 141*  BUN 65*  < > 74*  < > 77* 83* 39* 47* 35* 58* 78*  CREATININE 6.80*  < > 7.50*  < > 7.65* 8.10* 4.68* 5.48* 4.26* 6.04* 7.48*  CALCIUM 7.6*  < > 6.2*  < > 6.5* 6.3* 7.2* 7.1* 7.5* 7.8* 8.1*  PHOS 8.8*  --  8.9*  --   --   --   --   --   --   --   --   < > = values in this interval not displayed. CBC  Recent Labs Lab 03/22/14 2111  03/24/14 0420 03/25/14 0411 03/27/14 0521 03/29/14 0605  WBC 15.8*  < > 9.7 9.9 4.2 11.6*  NEUTROABS 13.7*  --  8.7* 8.9*  --   --   HGB 12.8*  < > 11.5* 11.0* 9.5* 10.1*  HCT 35.7*  < > 31.9* 29.8* 26.5* 29.5*  MCV 96.7  < > 93.8 92.8 95.7 92.8  PLT 164  < > 74* 89* 93* 103*  < > = values in this interval not displayed. Medications:    . amLODipine  5 mg Oral QHS  . antiseptic oral rinse  7 mL Mouth Rinse q12n4p  . atenolol  50 mg Oral Daily  . chlorhexidine  15 mL Mouth Rinse BID  . insulin aspart  0-15 Units Subcutaneous TID WC  . insulin aspart  0-5 Units Subcutaneous QHS  . pantoprazole (PROTONIX) IV  40 mg Intravenous QHS   Elmarie Shiley, MD 03/29/2014, 9:07 AM

## 2014-03-29 NOTE — Progress Notes (Signed)
     [facility] Progress Note  Subjective:  Feels ok.  Gets some abdominal pain if he tries to eat too much, but still no nausea or vomiting.  Objective:  Vital signs in last 24 hours: Temp:  [97.6 F (36.4 C)-99.1 F (37.3 C)] 98.2 F (36.8 C) (08/26 0506) Pulse Rate:  [51-89] 63 (08/26 0506) Resp:  [13-20] 16 (08/26 0506) BP: (143-184)/(66-84) 166/77 mmHg (08/26 0506) SpO2:  [95 %-99 %] 97 % (08/26 0506) Weight:  [113 lb 6.4 oz (51.438 kg)-117 lb 11.6 oz (53.4 kg)] 116 lb 4.8 oz (52.753 kg) (08/25 2202) Last BM Date: 03/28/14 General:  Alert, thin with temporal wasting, in NAD Heart:  Regular rate and rhythm; no murmurs Pulm:  CTAB.  No W/R/R. Abdomen:  Soft, non-distended. Normal bowel sounds.  Very mild upper abdominal TTP without R/R/G. Extremities:  Without edema. Neurologic:  Alert and  oriented x4;  grossly normal neurologically. Psych:  Alert and cooperative. Normal mood and affect.  Intake/Output from previous day: 08/25 0701 - 08/26 0700 In: -  Out: 1750 [Urine:1750]  Lab Results:  Recent Labs  03/27/14 0521 03/29/14 0605  WBC 4.2 11.6*  HGB 9.5* 10.1*  HCT 26.5* 29.5*  PLT 93* 103*   BMET  Recent Labs  03/27/14 0521 03/28/14 0620  NA 136* 136*  K 3.7 4.4  CL 96 96  CO2 19 19  GLUCOSE 158* 141*  BUN 58* 78*  CREATININE 6.04* 7.48*  CALCIUM 7.8* 8.1*   LFT  Recent Labs  03/28/14 0620  PROT 5.9*  ALBUMIN 2.2*  AST 22  ALT 12  ALKPHOS 61  BILITOT 0.5   Assessment / Plan: *68 y.o. male with ARF, elevated amylase/lipase (? acute pancreatitis). It is very unusual to have acute pancreatitis without significant abdominal pains OR radiologic findings of pancreatitis. Previous HIV meds were changed to stribild which contains elvitegravir (per epocrates drug index this can cause amylase, lipase elevation), tenofovir (can cause pancreatitis, nephrotoxicity), cobicistat and emtricitabine. His CR is very high (from normal a month  prior), renal failure can also cause elevations in amylase, lipase. Amylase and lipase are higher even again yesterday but not rechecked today (? If this correlates with his worsening renal failure). He is having some abdominal pain after eating, but is tolerating the diet overall.   *Wait results of MRI abdomen/MRCP without contrast, which will hopefully be performed later today (I spoke with MRI and they hope to perform this afternoon).  Will place him NPO for now.    LOS: 7 days   ZEHR, JESSICA D.  03/29/2014, 8:58 AM  Pager number [phone]   ________________________________________________________________________  Velora Heckler GI MD note:  I personally examined the patient, reviewed the data and agree with the assessment and plan described above.  Awaiting MRI, MRCP which I ordered yesterday.     Owens Loffler, MD [facility] Pager [phone]

## 2014-03-29 NOTE — Progress Notes (Signed)
[facility]  Date of Admission:  03/22/2014  Antibiotics: none  Subjective: No complaints  Objective: Temp:  [97.6 F (36.4 C)-99.1 F (37.3 C)] 98.2 F (36.8 C) (08/26 0506) Pulse Rate:  [51-89] 63 (08/26 0506) Resp:  [13-20] 16 (08/26 0506) BP: (143-179)/(66-84) 166/77 mmHg (08/26 0506) SpO2:  [95 %-98 %] 97 % (08/26 0506) Weight:  [116 lb 4.8 oz (52.753 kg)-117 lb 11.6 oz (53.4 kg)] 116 lb 4.8 oz (52.753 kg) (08/25 2202)  General: awake, alert, nad Skin: no rashes Lungs: CTA B Cor: RRR Abdomen: soft, nt, nd Ext: no edema  Lab Results Lab Results  Component Value Date   WBC 11.6* 03/29/2014   HGB 10.1* 03/29/2014   HCT 29.5* 03/29/2014   MCV 92.8 03/29/2014   PLT 103* 03/29/2014    Lab Results  Component Value Date   CREATININE 7.48* 03/28/2014   BUN 78* 03/28/2014   NA 136* 03/28/2014   K 4.4 03/28/2014   CL 96 03/28/2014   CO2 19 03/28/2014    Lab Results  Component Value Date   ALT 12 03/28/2014   AST 22 03/28/2014   ALKPHOS 61 03/28/2014   BILITOT 0.5 03/28/2014      Microbiology: Recent Results (from the past 240 hour(s))  MRSA PCR SCREENING     Status: None   Collection Time    03/23/14  1:50 AM      Result Value Ref Range Status   MRSA by PCR NEGATIVE  NEGATIVE Final   Comment:            The GeneXpert MRSA Assay (FDA     approved for NASAL specimens     only), is one component of a     comprehensive MRSA colonization     surveillance program. It is not     intended to diagnose MRSA     infection nor to guide or     monitor treatment for     MRSA infections.  URINE CULTURE     Status: None   Collection Time    03/23/14  2:56 AM      Result Value Ref Range Status   Specimen Description URINE, CATHETERIZED   Final   Special Requests NONE   Final   Culture  Setup Time     Final   Value: 03/23/2014 09:02     Performed at [facility] Count     Final   Value: NO GROWTH     Performed at [facility]   Culture     Final   Value: NO GROWTH     Performed at [facility]   Report Status 03/24/2014 FINAL   Final  CULTURE, BLOOD (ROUTINE X 2)     Status: None   Collection Time    03/23/14  3:00 AM      Result Value Ref Range Status   Specimen Description BLOOD LEFT ARM   Final   Special Requests BOTTLES DRAWN AEROBIC AND ANAEROBIC 10CC   Final   Culture  Setup Time     Final   Value: 03/23/2014 08:48     Performed at [facility]   Culture     Final   Value: NO GROWTH 5 DAYS     Performed at [facility]   Report Status 03/29/2014 FINAL   Final  CULTURE, BLOOD (ROUTINE X 2)     Status: None   Collection  Time    03/23/14  3:06 AM      Result Value Ref Range Status   Specimen Description BLOOD RIGHT HAND   Final   Special Requests BOTTLES DRAWN AEROBIC AND ANAEROBIC 5CC   Final   Culture  Setup Time     Final   Value: 03/23/2014 08:47     Performed at [facility]   Culture     Final   Value: NO GROWTH 5 DAYS     Performed at [facility]   Report Status 03/29/2014 FINAL   Final  AFB CULTURE WITH SMEAR     Status: None   Collection Time    03/23/14 12:00 PM      Result Value Ref Range Status   Specimen Description STOOL   Final   Special Requests NONE   Final   Acid Fast Smear     Final   Value: NO ACID FAST BACILLI SEEN     Performed at [facility]   Culture     Final   Value: CULTURE WILL BE EXAMINED FOR 6 WEEKS BEFORE ISSUING A FINAL REPORT     Performed at [facility]   Report Status PENDING   Incomplete  CLOSTRIDIUM DIFFICILE BY PCR     Status: None   Collection Time    03/24/14  5:24 AM      Result Value Ref Range Status   C difficile by pcr NEGATIVE  NEGATIVE Final  CULTURE, EXPECTORATED SPUTUM-ASSESSMENT     Status: None   Collection Time    03/24/14  5:24 AM      Result Value Ref Range Status   Specimen Description SPUTUM   Final   Special Requests NONE   Final   Sputum evaluation     Final    Value: MICROSCOPIC FINDINGS SUGGEST THAT THIS SPECIMEN IS NOT REPRESENTATIVE OF LOWER RESPIRATORY SECRETIONS. PLEASE RECOLLECT.     RESULT CALLED TO, READ BACK BY AND VERIFIED WITHWilson Singer RN [phone] GREEN R   Report Status 03/24/2014 FINAL   Final    Studies/Results: No results found.  Assessment/Plan: 1)  Acute pancreatitis - no known etiology.  MRCP today. Will change ARVs to exclude NRTIs when renal function stablizes.    2) HIV - will restart new ARV regimen after discharge.    Scharlene Gloss, [facility] [place] [email or web address] [phone] pager   [phone] cell 03/29/2014, 9:51 AM

## 2014-03-29 NOTE — Progress Notes (Signed)
PT Cancellation Note  Patient Details Name: [name] MRN: [id number] DOB: [birth year]   Cancelled Treatment:    Reason Eval/Treat Not Completed: Medical issues which prohibited therapy  Cancelled Treatment:    Reason Eval/Treat Not Completed: Medical issues which prohibited therapy  Orders received, chart reviewed;  Noted temporary R femoral HD catheter still in place;  We typically hold mobilizing with a non-tunneled femoral HD catheter due to risk of losing line;  Will sign off PT, and be happy to proceed with PT eval once temporary femoral HD catheter is out;  Please re-order PT when temporary femoral HD catheter is dc'd, and pt is appropriate to mobilize;  Thank you,  Roney Marion, PT  [facility] Pager [phone] Office [phone]    Roney Marion [facility] 03/29/2014, 8:01 AM

## 2014-03-29 NOTE — Progress Notes (Signed)
[facility] PROGRESS NOTE  [name]:[id number] DOB: January 08, 1946 DOA: 03/22/2014 PCP: Penni Homans, MD  Brief HPI: 68 year old male with HIV on STRIBILD presented to [facility] c/o emesis, diarrhea, and generalized weakness x 3 days. In ED found to be in renal failure with elevated lipase. Transferred to [facility] for ICU admission. Patient subsequently transfer to [facility]. Patient admitted with respiratory failure, renal failure, and acute pancreatitis. He is undergoing intermittent dialysis. His respiratory failure has improved after volume removed with dialysis. He is undergoing work up for pancreatitis. GI and renal helping with patient care.   Assessment/Plan: Acute pancreatitis, possibly 2nd to STRIBILD (hx of this with other ARV meds)  Unclear etiology, abd [place] > no obvious pancreatic abnormality, similar dilated CBD compared with 4/15. GI following. Per GI unusual presentation of pancreatitis without significant abdominal pain, or radiographic finding. HIV medications can cause elevation of amylase and lipase. MRI, MRCP ordered.   Acute on chronic respiratory failure  Uncompensated met acidosis pulm edema? Vs pancreatitis induced ALI -Repeated chest x ray: 8-24: Persistent asymmetric airspace process, likely bilateral infiltrates. No need for antibiotics per ID.  -Had fluids removed with dialysis, 2.4 l .   Acute renal failure, oliguric , High AG metabolic acidosis (suspect lactic, uremic) Slow progress. Thought to be secondary to volume depletion, diuretics, SIRS. Had dialysis 8/21, 8/22, 8/25. Nephrology following closely.  Chronic Diarrhea For last 6 months. C diff was negative.   Thrombocytopenia Improving. Repeat labs tomorrow.   HIV Holding HIV medications. ID following and will resume meds after discharge.   COPD without evidence of exacerbation.  Stable  Hypertension BP poorly controlled. Increase Amlodipine dose. Continue atenolol.   Acute encephalopathy in  setting of acidosis Resolved  Hyperkalemia  Resolved  DVT Prophylaxis: SCD's Code Status: Full Code.  Family Communication: Care discussed with patient.  Disposition Plan: remain inpatient.    Consultants:  Renal  ID  GI.   Procedures: 8/20 [place] abdo>>>Visualized pancreas has a normal sonographic appearance. No biliary sludge or gallstones. Upper limits of normal to mildly dilated CBD, the similar to the abdomen ultrasound in April. No intrahepatic biliary ductal dilatation identified   8/21 TTE > Mild LVH, EF 60%, PAP 80mHg  Antibiotics:  none  HPI/Subjective: He feels well. Denies any pain.   Objective: Filed Vitals:   03/29/14 0955  BP: 173/73  Pulse: 56  Temp: 98.6 F (37 C)  Resp: 18    Intake/Output Summary (Last 24 hours) at 03/29/14 1223 Last data filed at 03/29/14 1100  Gross per 24 hour  Intake    120 ml  Output   2225 ml  Net  -2105 ml   Filed Weights   03/28/14 1314 03/28/14 1729 03/28/14 2202  Weight: 53.3 kg (117 lb 8.1 oz) 53.4 kg (117 lb 11.6 oz) 52.753 kg (116 lb 4.8 oz)    Exam:   General:  Alert in no distress.   Cardiovascular: S 1, S 2 RRR  Respiratory: CTA bilaterally  Abdomen: BS present, soft, NT  Musculoskeletal: no edema.   Data Reviewed: Basic Metabolic Panel:  Recent Labs Lab 03/23/14 0300  03/24/14 0420  03/25/14 0411 03/25/14 1020 03/26/14 0350 03/27/14 0521 03/28/14 0620  NA 128*  < > 134*  < > 139 139 139 136* 136*  K 5.5*  < > 4.0  < > 3.7 3.9 3.6* 3.7 4.4  CL 94*  < > 93*  < > 92* 93* 96 96 96  CO2  7*  < > 13*  < > 16* 19 18* 19 19  GLUCOSE 134*  < > 139*  < > 146* 88 120* 158* 141*  BUN 65*  < > 74*  < > 39* 47* 35* 58* 78*  CREATININE 6.80*  < > 7.50*  < > 4.68* 5.48* 4.26* 6.04* 7.48*  CALCIUM 7.6*  < > 6.2*  < > 7.2* 7.1* 7.5* 7.8* 8.1*  MG 2.1  --  1.7  --   --   --   --   --   --   PHOS 8.8*  --  8.9*  --   --   --   --   --   --   < > = values in this interval not displayed. Liver  Function Tests:  Recent Labs Lab 03/22/14 2230 03/24/14 0420 03/25/14 0411 03/27/14 0521 03/28/14 0620  AST 50* 42* '31 21 22  ' ALT 32 43 '31 14 12  ' ALKPHOS 50 44 54 60 61  BILITOT 0.7 0.3 0.5 0.5 0.5  PROT 6.7 4.9* 5.3* 5.4* 5.9*  ALBUMIN 3.3* 2.3* 2.4* 2.2* 2.2*    Recent Labs Lab 03/23/14 1100 03/25/14 0411 03/26/14 0350 03/27/14 0521 03/28/14 0620  LIPASE 2024* 250* 542* 1420* 1481*  AMYLASE 1822*  --   --  847* 1050*   CBC:  Recent Labs Lab 03/22/14 2111 03/23/14 0300 03/24/14 0420 03/25/14 0411 03/27/14 0521 03/29/14 0605  WBC 15.8* 15.1* 9.7 9.9 4.2 11.6*  NEUTROABS 13.7*  --  8.7* 8.9*  --   --   HGB 12.8* 12.4* 11.5* 11.0* 9.5* 10.1*  HCT 35.7* 35.6* 31.9* 29.8* 26.5* 29.5*  MCV 96.7 97.8 93.8 92.8 95.7 92.8  PLT 164 133* 74* 89* 93* 103*   Cardiac Enzymes:  Recent Labs Lab 03/23/14 0008 03/23/14 0300 03/23/14 0826 03/23/14 1911  CKTOTAL 345*  --   --   --   TROPONINI  --  <0.30 <0.30 <0.30   CBG:  Recent Labs Lab 03/27/14 2111 03/28/14 0756 03/28/14 1131 03/28/14 1758 03/28/14 2201  GLUCAP 143* 130* 139* 90 182*    Recent Results (from the past 240 hour(s))  MRSA PCR SCREENING     Status: None   Collection Time    03/23/14  1:50 AM      Result Value Ref Range Status   MRSA by PCR NEGATIVE  NEGATIVE Final   Comment:            The GeneXpert MRSA Assay (FDA     approved for NASAL specimens     only), is one component of a     comprehensive MRSA colonization     surveillance program. It is not     intended to diagnose MRSA     infection nor to guide or     monitor treatment for     MRSA infections.  URINE CULTURE     Status: None   Collection Time    03/23/14  2:56 AM      Result Value Ref Range Status   Specimen Description URINE, CATHETERIZED   Final   Special Requests NONE   Final   Culture  Setup Time     Final   Value: 03/23/2014 09:02     Performed at [facility]     Final   Value: NO GROWTH      Performed at [facility]   Culture     Final   Value:  NO GROWTH     Performed at [facility]   Report Status 03/24/2014 FINAL   Final  CULTURE, BLOOD (ROUTINE X 2)     Status: None   Collection Time    03/23/14  3:00 AM      Result Value Ref Range Status   Specimen Description BLOOD LEFT ARM   Final   Special Requests BOTTLES DRAWN AEROBIC AND ANAEROBIC 10CC   Final   Culture  Setup Time     Final   Value: 03/23/2014 08:48     Performed at [facility]   Culture     Final   Value: NO GROWTH 5 DAYS     Performed at [facility]   Report Status 03/29/2014 FINAL   Final  CULTURE, BLOOD (ROUTINE X 2)     Status: None   Collection Time    03/23/14  3:06 AM      Result Value Ref Range Status   Specimen Description BLOOD RIGHT HAND   Final   Special Requests BOTTLES DRAWN AEROBIC AND ANAEROBIC 5CC   Final   Culture  Setup Time     Final   Value: 03/23/2014 08:47     Performed at [facility]   Culture     Final   Value: NO GROWTH 5 DAYS     Performed at [facility]   Report Status 03/29/2014 FINAL   Final  AFB CULTURE WITH SMEAR     Status: None   Collection Time    03/23/14 12:00 PM      Result Value Ref Range Status   Specimen Description STOOL   Final   Special Requests NONE   Final   Acid Fast Smear     Final   Value: NO ACID FAST BACILLI SEEN     Performed at [facility]   Culture     Final   Value: CULTURE WILL BE EXAMINED FOR 6 WEEKS BEFORE ISSUING A FINAL REPORT     Performed at [facility]   Report Status PENDING   Incomplete  CLOSTRIDIUM DIFFICILE BY PCR     Status: None   Collection Time    03/24/14  5:24 AM      Result Value Ref Range Status   C difficile by pcr NEGATIVE  NEGATIVE Final  CULTURE, EXPECTORATED SPUTUM-ASSESSMENT     Status: None   Collection Time    03/24/14  5:24 AM      Result Value Ref Range Status   Specimen Description SPUTUM   Final   Special Requests NONE   Final    Sputum evaluation     Final   Value: MICROSCOPIC FINDINGS SUGGEST THAT THIS SPECIMEN IS NOT REPRESENTATIVE OF LOWER RESPIRATORY SECRETIONS. PLEASE RECOLLECT.     RESULT CALLED TO, READ BACK BY AND VERIFIED WITHWilson Singer RN [phone] GREEN R   Report Status 03/24/2014 FINAL   Final     Studies: No results found.  Scheduled Meds: . amLODipine  5 mg Oral QHS  . antiseptic oral rinse  7 mL Mouth Rinse q12n4p  . atenolol  50 mg Oral Daily  . chlorhexidine  15 mL Mouth Rinse BID  . insulin aspart  0-15 Units Subcutaneous TID WC  . insulin aspart  0-5 Units Subcutaneous QHS  . pantoprazole (PROTONIX) IV  40 mg Intravenous QHS   Continuous Infusions: . sodium chloride Stopped (03/26/14 1330)    Active Problems:  AKI (acute kidney injury)   Metabolic acidosis   Hyperkalemia   Hyponatremia   HIV disease   Acute gastroenteritis   Protein-calorie malnutrition, severe   Acute kidney injury    Time spent: 30 minutes.     [facility] Pager [phone].   If 7PM-7AM, please contact night-coverage at [email or web address], password [facility] 03/29/2014, 12:23 PM  LOS: 7 days

## 2014-03-29 NOTE — Progress Notes (Signed)
CBG 209. 

## 2014-03-29 NOTE — Progress Notes (Signed)
OT Cancellation Note  Patient Details Name: [name] MRN: [id number] DOB: 1946/04/29   Cancelled Treatment:    Reason Eval/Treat Not Completed: Medical issues which prohibited therapy (Pt with femoral HD catheter.  Please reorder when mobilization in not contraindicated. )  Ra Pfiester, Haze Boyden 03/29/2014, 8:20 AM

## 2014-03-29 NOTE — Progress Notes (Signed)
MRI called pt NPO since breakfast only clears then, will be up in around 2 hours to get pt for MRI without contrast of pancreas.

## 2014-03-30 DIAGNOSIS — R590 Localized enlarged lymph nodes: Secondary | ICD-10-CM

## 2014-03-30 DIAGNOSIS — A084 Viral intestinal infection, unspecified: Secondary | ICD-10-CM | POA: Insufficient documentation

## 2014-03-30 DIAGNOSIS — K859 Acute pancreatitis without necrosis or infection, unspecified: Secondary | ICD-10-CM

## 2014-03-30 DIAGNOSIS — R197 Diarrhea, unspecified: Secondary | ICD-10-CM

## 2014-03-30 HISTORY — DX: Localized enlarged lymph nodes: R59.0

## 2014-03-30 LAB — RENAL FUNCTION PANEL
ANION GAP: 24 — AB (ref 5–15)
Albumin: 2.4 g/dL — ABNORMAL LOW (ref 3.5–5.2)
BUN: 65 mg/dL — AB (ref 6–23)
CHLORIDE: 100 meq/L (ref 96–112)
CO2: 17 mEq/L — ABNORMAL LOW (ref 19–32)
Calcium: 8.5 mg/dL (ref 8.4–10.5)
Creatinine, Ser: 6.08 mg/dL — ABNORMAL HIGH (ref 0.50–1.35)
GFR calc Af Amer: 10 mL/min — ABNORMAL LOW (ref >90)
GFR calc non Af Amer: 9 mL/min — ABNORMAL LOW (ref >90)
Glucose, Bld: 126 mg/dL — ABNORMAL HIGH (ref 70–99)
POTASSIUM: 4.2 meq/L (ref 3.7–5.3)
Phosphorus: 5.8 mg/dL — ABNORMAL HIGH (ref 2.3–4.6)
Sodium: 141 mEq/L (ref 137–147)

## 2014-03-30 LAB — GLUCOSE, CAPILLARY
GLUCOSE-CAPILLARY: 135 mg/dL — AB (ref 70–99)
Glucose-Capillary: 162 mg/dL — ABNORMAL HIGH (ref 70–99)
Glucose-Capillary: 209 mg/dL — ABNORMAL HIGH (ref 70–99)
Glucose-Capillary: 130 mg/dL — ABNORMAL HIGH (ref 70–99)
Glucose-Capillary: 241 mg/dL — ABNORMAL HIGH (ref 70–99)
Glucose-Capillary: 167 mg/dL — ABNORMAL HIGH (ref 70–99)
Glucose-Capillary: 221 mg/dL — ABNORMAL HIGH (ref 70–99)

## 2014-03-30 LAB — MAGNESIUM: MAGNESIUM: 2.5 mg/dL (ref 1.5–2.5)

## 2014-03-30 LAB — CBC
HCT: 27.5 % — ABNORMAL LOW (ref 39.0–52.0)
HEMOGLOBIN: 9.4 g/dL — AB (ref 13.0–17.0)
MCH: 33.2 pg (ref 26.0–34.0)
MCHC: 34.2 g/dL (ref 30.0–36.0)
MCV: 97.2 fL (ref 78.0–100.0)
Platelets: 100 10*3/uL — ABNORMAL LOW (ref 150–400)
RBC: 2.83 MIL/uL — AB (ref 4.22–5.81)
RDW: 13.3 % (ref 11.5–15.5)
WBC: 7.9 10*3/uL (ref 4.0–10.5)

## 2014-03-30 LAB — LIPASE, BLOOD: LIPASE: 740 U/L — AB (ref 11–59)

## 2014-03-30 MED ORDER — SODIUM BICARBONATE 650 MG PO TABS
650.0000 mg | ORAL_TABLET | Freq: Three times a day (TID) | ORAL | Status: DC
  Administered 2014-03-30 – 2014-04-05 (×17): 650 mg via ORAL
  Filled 2014-03-30 (×20): qty 1

## 2014-03-30 NOTE — Assessment & Plan Note (Signed)
Will obtain labs to include CMV and EBV.  Patient HIV +.

## 2014-03-30 NOTE — Progress Notes (Signed)
Patient ID: [name], male   DOB: 10/27/1945, 68 y.o.   MRN: [id number]  [facility] Progress Note   Assessment/ Plan:   1. ARF:  Continues to have good urine output. Labs from this morning are pending-we'll discontinue Foley catheter and have him collect urine in the urinal for quantification. Discussed with nursing regarding this change. 2. Metabolic Acidosis: Improved with dialysis, no labs available from this morning 3. Pancreatitis- clinically improving and now started on clear liquids that he is tolerating without abdominal pain (labs reveal elevated amylase/lipase) 4. Hyponatremia- labs pending from this morning 5. HIV- per ID 6. HTN- blood pressure slightly elevated, we'll reevaluate antihypertensive therapy  Subjective:   Reports to be feeling better-awaiting removal of Foley catheter    Objective:   BP 155/73  Pulse 60  Temp(Src) 98.1 F (36.7 C) (Oral)  Resp 17  Ht 5\' 3"  (1.6 m)  Wt 52.753 kg (116 lb 4.8 oz)  BMI 20.61 kg/m2  SpO2 98%  Intake/Output Summary (Last 24 hours) at 03/30/14 0102 Last data filed at 03/30/14 7253  Gross per 24 hour  Intake    240 ml  Output   1025 ml  Net   -785 ml   Weight change:   Physical Exam: Gen: Comfortably resting in recliner, watching television CVS: Pulse regular rate and rhythm, S1 and S2 normal Resp: Coarse breath sounds bilaterally, no rales/wheeze Abd: Soft, flat, mildly tender over the epigastric area Ext: Trace lower extremity edema  Imaging: Mr Abdomen Mrcp Wo Cm  03/30/2014   CLINICAL DATA:  Pancreatitis. Mild biliary ductal dilatation seen on ultrasound. HIV.  EXAM: MRI ABDOMEN WITHOUT  (INCLUDING MRCP)  TECHNIQUE: Multiplanar multisequence MR imaging of the abdomen was performed. Heavily T2-weighted images of the biliary and pancreatic ducts were obtained, and three-dimensional MRCP images were rendered by post processing.  COMPARISON:  CT on 03/22/14  FINDINGS: Liver: No mass identified. Diffuse  T2 hypointensity of the hepatic parenchyma is consistent with transfusional siderosis.  Gallbladder/Biliary: Gallbladder is unremarkable. No evidence of biliary ductal dilatation with common bile duct measuring 6 mm in diameter. No evidence of choledocholithiasis.  Pancreas: Diffuse pancreatic swelling is seen with mild peripancreatic edema, consistent with acute pancreatitis. No evidence of pancreatic mass. No evidence of pancreatic ductal dilatation or pancreas divisum.  Spleen: No evidence of splenomegaly. 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  Adrenal Glands:  No mass identified.  Kidneys: No masses identified. No evidence of hydronephrosis. Diffuse T2 hyperintensity suspicious for HIV nephropathy or other medical renal disease.  Lymph Nodes:  No pathologically enlarged lymph nodes identified.  Bowel: Mild diffuse gastric wall thickening, likely secondary to pancreatitis.  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  Musculoskeletal:  No suspicious bone lesions identified.  Other:  Small bilateral pleural effusions noted.  IMPRESSION: Mild acute pancreatitis. No evidence of pancreatic mass, pseudocysts, or pancreatic ductal dilatation.  No evidence of biliary ductal dilatation or choledocholithiasis.  Findings consistent with transfusional siderosis, and HIV nephropathy or other medical renal disease.   Electronically Signed   By: Earle Gell M.D.   On: 03/30/2014 08:23   Mr 3d Recon At Scanner  03/30/2014   CLINICAL DATA:  Pancreatitis. Mild biliary ductal dilatation seen on ultrasound. HIV.  EXAM: MRI ABDOMEN WITHOUT  (INCLUDING MRCP)  TECHNIQUE: Multiplanar multisequence MR imaging of the abdomen was performed. Heavily T2-weighted images of the biliary and pancreatic ducts were obtained, and three-dimensional MRCP images were rendered by post  processing.  COMPARISON:  CT on 03/22/14  FINDINGS: Liver: No mass identified. Diffuse T2 hypointensity of  the hepatic parenchyma is consistent with transfusional siderosis.  Gallbladder/Biliary: Gallbladder is unremarkable. No evidence of biliary ductal dilatation with common bile duct measuring 6 mm in diameter. No evidence of choledocholithiasis.  Pancreas: Diffuse pancreatic swelling is seen with mild peripancreatic edema, consistent with acute pancreatitis. No evidence of pancreatic mass. No evidence of pancreatic ductal dilatation or pancreas divisum.  Spleen: No evidence of splenomegaly. 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  Adrenal Glands:  No mass identified.  Kidneys: No masses identified. No evidence of hydronephrosis. Diffuse T2 hyperintensity suspicious for HIV nephropathy or other medical renal disease.  Lymph Nodes:  No pathologically enlarged lymph nodes identified.  Bowel: Mild diffuse gastric wall thickening, likely secondary to pancreatitis.  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  Musculoskeletal:  No suspicious bone lesions identified.  Other:  Small bilateral pleural effusions noted.  IMPRESSION: Mild acute pancreatitis. No evidence of pancreatic mass, pseudocysts, or pancreatic ductal dilatation.  No evidence of biliary ductal dilatation or choledocholithiasis.  Findings consistent with transfusional siderosis, and HIV nephropathy or other medical renal disease.   Electronically Signed   By: Earle Gell M.D.   On: 03/30/2014 08:23    Labs: BMET  Recent Labs Lab 03/24/14 0420  03/24/14 1600 03/24/14 2220 03/25/14 0411 03/25/14 1020 03/26/14 0350 03/27/14 0521 03/28/14 0620  NA 134*  < > 135* 137 139 139 139 136* 136*  K 4.0  < > 3.9 4.1 3.7 3.9 3.6* 3.7 4.4  CL 93*  < > 92* 90* 92* 93* 96 96 96  CO2 13*  < > 16* 14* 16* 19 18* 19 19  GLUCOSE 139*  < > 80 125* 146* 88 120* 158* 141*  BUN 74*  < > 77* 83* 39* 47* 35* 58* 78*  CREATININE 7.50*  < > 7.65* 8.10* 4.68* 5.48* 4.26* 6.04* 7.48*  CALCIUM 6.2*  < > 6.5*  6.3* 7.2* 7.1* 7.5* 7.8* 8.1*  PHOS 8.9*  --   --   --   --   --   --   --   --   < > = values in this interval not displayed. CBC  Recent Labs Lab 03/24/14 0420 03/25/14 0411 03/27/14 0521 03/29/14 0605  WBC 9.7 9.9 4.2 11.6*  NEUTROABS 8.7* 8.9*  --   --   HGB 11.5* 11.0* 9.5* 10.1*  HCT 31.9* 29.8* 26.5* 29.5*  MCV 93.8 92.8 95.7 92.8  PLT 74* 89* 93* 103*   Medications:    . amLODipine  10 mg Oral QHS  . antiseptic oral rinse  7 mL Mouth Rinse q12n4p  . atenolol  50 mg Oral Daily  . chlorhexidine  15 mL Mouth Rinse BID  . insulin aspart  0-15 Units Subcutaneous TID WC  . insulin aspart  0-5 Units Subcutaneous QHS  . pantoprazole (PROTONIX) IV  40 mg Intravenous QHS   Elmarie Shiley, MD 03/30/2014, 9:02 AM

## 2014-03-30 NOTE — Progress Notes (Signed)
[facility] PROGRESS NOTE  HRISHIKESH HOEG TXM:[id number] DOB: [birth year] DOA: 03/22/2014 PCP: Penni Homans, MD  Brief HPI: 68 year old male with HIV on STRIBILD presented to [facility] c/o emesis, diarrhea, and generalized weakness x 3 days. In ED found to be in renal failure with elevated lipase. Transferred to [facility] for ICU admission. Patient subsequently transfer to [facility]. Patient admitted with respiratory failure, renal failure, and acute pancreatitis. He is undergoing intermittent dialysis. His respiratory failure has improved after volume removed with dialysis. He is undergoing work up for pancreatitis. GI and renal helping with patient care.   Assessment/Plan: Acute pancreatitis He is much improved. Possibly 2nd to STRIBILD (hx of this with other ARV meds). Abd [place] > no obvious pancreatic abnormality, similar dilated CBD compared with 4/15. GI following. HIV medications can cause elevation of amylase and lipase. MRI, MRCP as below.   Acute on chronic respiratory failure  Uncompensated met acidosis pulm edema? Vs pancreatitis induced ALI -Repeated chest x ray: 8-24: Persistent asymmetric airspace process, likely bilateral infiltrates. No need for antibiotics per ID.  -Had fluids removed with dialysis Improved.   Acute renal failure, oliguric , High AG metabolic acidosis (suspect lactic, uremic) Slow progress. Thought to be secondary to volume depletion, diuretics, SIRS. Had dialysis 8/21, 8/22, 8/25. Nephrology following closely. Creatinine improved today. Bicarb is low. Will start Sod bicarb. If no further dialysis hopefully his dialysis catheter can be removed in AM.  Chronic Diarrhea For last 6 months. C diff was negative.   Thrombocytopenia Improving. Repeat labs tomorrow.   HIV Holding HIV medications. ID following and will resume meds after discharge.   COPD without evidence of exacerbation.  Stable  Hypertension BP poorly controlled. Increased Amlodipine dose  8/26. Continue atenolol.   Acute encephalopathy in setting of acidosis Resolved  Hyperkalemia  Resolved  DVT Prophylaxis: SCD's Code Status: Full Code.  Family Communication: Discussed with patient.  Disposition Plan: Await improvement in renal function.    Consultants:  Renal  ID  GI.   Procedures: 8/20 [place] abdo>>>Visualized pancreas has a normal sonographic appearance. No biliary sludge or gallstones. Upper limits of normal to mildly dilated CBD, the similar to the abdomen ultrasound in April. No intrahepatic biliary ductal dilatation identified   8/21 TTE > Mild LVH, EF 60%, PAP 63mHg  Antibiotics:  none  HPI/Subjective: He feels well. Denies any pain. Making urine.   Objective: Filed Vitals:   03/30/14 0906  BP: 146/65  Pulse: 59  Temp: 98 F (36.7 C)  Resp: 18    Intake/Output Summary (Last 24 hours) at 03/30/14 1043 Last data filed at 03/30/14 04825 Gross per 24 hour  Intake    240 ml  Output   1225 ml  Net   -985 ml   Filed Weights   03/28/14 1314 03/28/14 1729 03/28/14 2202  Weight: 53.3 kg (117 lb 8.1 oz) 53.4 kg (117 lb 11.6 oz) 52.753 kg (116 lb 4.8 oz)    Exam:   General:  Alert in no distress.   Cardiovascular: S 1, S 2 RRR  Respiratory: CTA bilaterally  Abdomen: BS present, soft, NT  Musculoskeletal: no edema.   Data Reviewed: Basic Metabolic Panel:  Recent Labs Lab 03/24/14 0420  03/25/14 1020 03/26/14 0350 03/27/14 0521 03/28/14 0620 03/30/14 0849  NA 134*  < > 139 139 136* 136* 141  K 4.0  < > 3.9 3.6* 3.7 4.4 4.2  CL 93*  < > 93* 96 96 96 100  CO2 13*  < > 19 18* 19 19 17*  GLUCOSE 139*  < > 88 120* 158* 141* 126*  BUN 74*  < > 47* 35* 58* 78* 65*  CREATININE 7.50*  < > 5.48* 4.26* 6.04* 7.48* PENDING  CALCIUM 6.2*  < > 7.1* 7.5* 7.8* 8.1* 8.5  MG 1.7  --   --   --   --   --  2.5  PHOS 8.9*  --   --   --   --   --  5.8*  < > = values in this interval not displayed. Liver Function Tests:  Recent Labs Lab  03/24/14 0420 03/25/14 0411 03/27/14 0521 03/28/14 0620 03/30/14 0849  AST 42* '31 21 22  ' --   ALT 43 '31 14 12  ' --   ALKPHOS 44 54 60 61  --   BILITOT 0.3 0.5 0.5 0.5  --   PROT 4.9* 5.3* 5.4* 5.9*  --   ALBUMIN 2.3* 2.4* 2.2* 2.2* PENDING    Recent Labs Lab 03/23/14 1100 03/25/14 0411 03/26/14 0350 03/27/14 0521 03/28/14 0620 03/30/14 0849  LIPASE 2024* 250* 542* 1420* 1481* 740*  AMYLASE 1822*  --   --  847* 1050*  --    CBC:  Recent Labs Lab 03/24/14 0420 03/25/14 0411 03/27/14 0521 03/29/14 0605 03/30/14 0849  WBC 9.7 9.9 4.2 11.6* 7.9  NEUTROABS 8.7* 8.9*  --   --   --   HGB 11.5* 11.0* 9.5* 10.1* 9.4*  HCT 31.9* 29.8* 26.5* 29.5* 27.5*  MCV 93.8 92.8 95.7 92.8 97.2  PLT 74* 89* 93* 103* 100*   Cardiac Enzymes:  Recent Labs Lab 03/23/14 1911  TROPONINI <0.30   CBG:  Recent Labs Lab 03/28/14 1758 03/28/14 2201 03/29/14 1648 03/29/14 2155 03/30/14 0824  GLUCAP 90 182* 133* 131* 130*    Recent Results (from the past 240 hour(s))  MRSA PCR SCREENING     Status: None   Collection Time    03/23/14  1:50 AM      Result Value Ref Range Status   MRSA by PCR NEGATIVE  NEGATIVE Final   Comment:            The GeneXpert MRSA Assay (FDA     approved for NASAL specimens     only), is one component of a     comprehensive MRSA colonization     surveillance program. It is not     intended to diagnose MRSA     infection nor to guide or     monitor treatment for     MRSA infections.  URINE CULTURE     Status: None   Collection Time    03/23/14  2:56 AM      Result Value Ref Range Status   Specimen Description URINE, CATHETERIZED   Final   Special Requests NONE   Final   Culture  Setup Time     Final   Value: 03/23/2014 09:02     Performed at [facility] Count     Final   Value: NO GROWTH     Performed at [facility]   Culture     Final   Value: NO GROWTH     Performed at [facility]   Report Status  03/24/2014 FINAL   Final  CULTURE, BLOOD (ROUTINE X 2)     Status: None   Collection Time    03/23/14  3:00 AM  Result Value Ref Range Status   Specimen Description BLOOD LEFT ARM   Final   Special Requests BOTTLES DRAWN AEROBIC AND ANAEROBIC 10CC   Final   Culture  Setup Time     Final   Value: 03/23/2014 08:48     Performed at [facility]   Culture     Final   Value: NO GROWTH 5 DAYS     Performed at [facility]   Report Status 03/29/2014 FINAL   Final  CULTURE, BLOOD (ROUTINE X 2)     Status: None   Collection Time    03/23/14  3:06 AM      Result Value Ref Range Status   Specimen Description BLOOD RIGHT HAND   Final   Special Requests BOTTLES DRAWN AEROBIC AND ANAEROBIC 5CC   Final   Culture  Setup Time     Final   Value: 03/23/2014 08:47     Performed at [facility]   Culture     Final   Value: NO GROWTH 5 DAYS     Performed at [facility]   Report Status 03/29/2014 FINAL   Final  AFB CULTURE WITH SMEAR     Status: None   Collection Time    03/23/14 12:00 PM      Result Value Ref Range Status   Specimen Description STOOL   Final   Special Requests NONE   Final   Acid Fast Smear     Final   Value: NO ACID FAST BACILLI SEEN     Performed at [facility]   Culture     Final   Value: CULTURE WILL BE EXAMINED FOR 6 WEEKS BEFORE ISSUING A FINAL REPORT     Performed at [facility]   Report Status PENDING   Incomplete  CLOSTRIDIUM DIFFICILE BY PCR     Status: None   Collection Time    03/24/14  5:24 AM      Result Value Ref Range Status   C difficile by pcr NEGATIVE  NEGATIVE Final  CULTURE, EXPECTORATED SPUTUM-ASSESSMENT     Status: None   Collection Time    03/24/14  5:24 AM      Result Value Ref Range Status   Specimen Description SPUTUM   Final   Special Requests NONE   Final   Sputum evaluation     Final   Value: MICROSCOPIC FINDINGS SUGGEST THAT THIS SPECIMEN IS NOT REPRESENTATIVE OF LOWER RESPIRATORY  SECRETIONS. PLEASE RECOLLECT.     RESULT CALLED TO, READ BACK BY AND VERIFIED WITHWilson Singer RN [id number] [phone] GREEN R   Report Status 03/24/2014 FINAL   Final     Studies: Mr Abdomen Mrcp Wo Cm  03/30/2014   CLINICAL DATA:  Pancreatitis. Mild biliary ductal dilatation seen on ultrasound. HIV.  EXAM: MRI ABDOMEN WITHOUT  (INCLUDING MRCP)  TECHNIQUE: Multiplanar multisequence MR imaging of the abdomen was performed. Heavily T2-weighted images of the biliary and pancreatic ducts were obtained, and three-dimensional MRCP images were rendered by post processing.  COMPARISON:  CT on 03/22/14  FINDINGS: Liver: No mass identified. Diffuse T2 hypointensity of the hepatic parenchyma is consistent with transfusional siderosis.  Gallbladder/Biliary: Gallbladder is unremarkable. No evidence of biliary ductal dilatation with common bile duct measuring 6 mm in diameter. No evidence of choledocholithiasis.  Pancreas: Diffuse pancreatic swelling is seen with mild peripancreatic edema, consistent with acute pancreatitis. No evidence of pancreatic mass. No evidence of pancreatic ductal dilatation or  pancreas divisum.  Spleen: No evidence of splenomegaly. 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  Adrenal Glands:  No mass identified.  Kidneys: No masses identified. No evidence of hydronephrosis. Diffuse T2 hyperintensity suspicious for HIV nephropathy or other medical renal disease.  Lymph Nodes:  No pathologically enlarged lymph nodes identified.  Bowel: Mild diffuse gastric wall thickening, likely secondary to pancreatitis.  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  Musculoskeletal:  No suspicious bone lesions identified.  Other:  Small bilateral pleural effusions noted.  IMPRESSION: Mild acute pancreatitis. No evidence of pancreatic mass, pseudocysts, or pancreatic ductal dilatation.  No evidence of biliary ductal dilatation or choledocholithiasis.  Findings  consistent with transfusional siderosis, and HIV nephropathy or other medical renal disease.   Electronically Signed   By: Earle Gell M.D.   On: 03/30/2014 08:23   Mr 3d Recon At Scanner  03/30/2014   CLINICAL DATA:  Pancreatitis. Mild biliary ductal dilatation seen on ultrasound. HIV.  EXAM: MRI ABDOMEN WITHOUT  (INCLUDING MRCP)  TECHNIQUE: Multiplanar multisequence MR imaging of the abdomen was performed. Heavily T2-weighted images of the biliary and pancreatic ducts were obtained, and three-dimensional MRCP images were rendered by post processing.  COMPARISON:  CT on 03/22/14  FINDINGS: Liver: No mass identified. Diffuse T2 hypointensity of the hepatic parenchyma is consistent with transfusional siderosis.  Gallbladder/Biliary: Gallbladder is unremarkable. No evidence of biliary ductal dilatation with common bile duct measuring 6 mm in diameter. No evidence of choledocholithiasis.  Pancreas: Diffuse pancreatic swelling is seen with mild peripancreatic edema, consistent with acute pancreatitis. No evidence of pancreatic mass. No evidence of pancreatic ductal dilatation or pancreas divisum.  Spleen: No evidence of splenomegaly. 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  Adrenal Glands:  No mass identified.  Kidneys: No masses identified. No evidence of hydronephrosis. Diffuse T2 hyperintensity suspicious for HIV nephropathy or other medical renal disease.  Lymph Nodes:  No pathologically enlarged lymph nodes identified.  Bowel: Mild diffuse gastric wall thickening, likely secondary to pancreatitis.  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  Musculoskeletal:  No suspicious bone lesions identified.  Other:  Small bilateral pleural effusions noted.  IMPRESSION: Mild acute pancreatitis. No evidence of pancreatic mass, pseudocysts, or pancreatic ductal dilatation.  No evidence of biliary ductal dilatation or choledocholithiasis.  Findings consistent with  transfusional siderosis, and HIV nephropathy or other medical renal disease.   Electronically Signed   By: Earle Gell M.D.   On: 03/30/2014 08:23    Scheduled Meds: . amLODipine  10 mg Oral QHS  . antiseptic oral rinse  7 mL Mouth Rinse q12n4p  . atenolol  50 mg Oral Daily  . chlorhexidine  15 mL Mouth Rinse BID  . insulin aspart  0-15 Units Subcutaneous TID WC  . insulin aspart  0-5 Units Subcutaneous QHS  . pantoprazole (PROTONIX) IV  40 mg Intravenous QHS   Continuous Infusions: . sodium chloride Stopped (03/26/14 1330)    Active Problems:   AKI (acute kidney injury)   Metabolic acidosis   Hyperkalemia   Hyponatremia   HIV disease   Acute gastroenteritis   Protein-calorie malnutrition, severe   Acute kidney injury    Time spent: 30 minutes.     [facility] Pager [phone].   If 7PM-7AM, please contact night-coverage at [email or web address], password [facility] 03/30/2014, 10:43 AM  LOS: 8 days

## 2014-03-30 NOTE — Progress Notes (Signed)
[facility] Progress Note  Subjective:  Feels well.  MRCP actually shows mild pancreatitis with no other abnormalities.  He was just placed back on clear liquids this AM, but would like to eat.  Lipase and renal function from this AM are still pending.  Objective:  Vital signs in last 24 hours: Temp:  [98 F (36.7 C)-98.6 F (37 C)] 98 F (36.7 C) (08/27 0906) Pulse Rate:  [56-60] 59 (08/27 0906) Resp:  [16-18] 18 (08/27 0906) BP: (146-187)/(65-80) 146/65 mmHg (08/27 0906) SpO2:  [97 %-100 %] 100 % (08/27 0906) Last BM Date: 03/29/14 General:  Alert, thin with temporal wasting, in NAD Heart:  Regular rate and rhythm; no murmurs Pulm:  CTAB.  No W/R/R. Abdomen:  Soft, non-distended. Normal bowel sounds.  Minimal upper abdominal TTP.  Extremities:  Without edema. Neurologic:  Alert and  oriented x4;  grossly normal neurologically. Psych:  Alert and cooperative. Normal mood and affect.  Intake/Output from previous day: 08/26 0701 - 08/27 0700 In: 240 [P.O.:240] Out: 1025 [Urine:1025] Intake/Output this shift: Total I/O In: 240 [P.O.:240] Out: -   Lab Results:  Recent Labs  03/29/14 0605 03/30/14 0849  WBC 11.6* 7.9  HGB 10.1* 9.4*  HCT 29.5* 27.5*  PLT 103* 100*   BMET  Recent Labs  03/28/14 0620  NA 136*  K 4.4  CL 96  CO2 19  GLUCOSE 141*  BUN 78*  CREATININE 7.48*  CALCIUM 8.1*   LFT  Recent Labs  03/28/14 0620  PROT 5.9*  ALBUMIN 2.2*  AST 22  ALT 12  ALKPHOS 61  BILITOT 0.5   Mr Abdomen Mrcp Wo Cm  03/30/2014   CLINICAL DATA:  Pancreatitis. Mild biliary ductal dilatation seen on ultrasound. HIV.  EXAM: MRI ABDOMEN WITHOUT  (INCLUDING MRCP)  TECHNIQUE: Multiplanar multisequence MR imaging of the abdomen was performed. Heavily T2-weighted images of the biliary and pancreatic ducts were obtained, and three-dimensional MRCP images were rendered by post processing.  COMPARISON:  CT on 03/22/14  FINDINGS: Liver: No mass  identified. Diffuse T2 hypointensity of the hepatic parenchyma is consistent with transfusional siderosis.  Gallbladder/Biliary: Gallbladder is unremarkable. No evidence of biliary ductal dilatation with common bile duct measuring 6 mm in diameter. No evidence of choledocholithiasis.  Pancreas: Diffuse pancreatic swelling is seen with mild peripancreatic edema, consistent with acute pancreatitis. No evidence of pancreatic mass. No evidence of pancreatic ductal dilatation or pancreas divisum.  Spleen: No evidence of splenomegaly. 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  Adrenal Glands:  No mass identified.  Kidneys: No masses identified. No evidence of hydronephrosis. Diffuse T2 hyperintensity suspicious for HIV nephropathy or other medical renal disease.  Lymph Nodes:  No pathologically enlarged lymph nodes identified.  Bowel: Mild diffuse gastric wall thickening, likely secondary to pancreatitis.  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  Musculoskeletal:  No suspicious bone lesions identified.  Other:  Small bilateral pleural effusions noted.  IMPRESSION: Mild acute pancreatitis. No evidence of pancreatic mass, pseudocysts, or pancreatic ductal dilatation.  No evidence of biliary ductal dilatation or choledocholithiasis.  Findings consistent with transfusional siderosis, and HIV nephropathy or other medical renal disease.   Electronically Signed   By: Earle Gell M.D.   On: 03/30/2014 08:23   Mr 3d Recon At Scanner  03/30/2014   CLINICAL DATA:  Pancreatitis. Mild biliary ductal dilatation seen on ultrasound. HIV.  EXAM: MRI ABDOMEN WITHOUT  (INCLUDING MRCP)  TECHNIQUE: Multiplanar multisequence MR imaging of  the abdomen was performed. Heavily T2-weighted images of the biliary and pancreatic ducts were obtained, and three-dimensional MRCP images were rendered by post processing.  COMPARISON:  CT on 03/22/14  FINDINGS: Liver: No mass identified. Diffuse  T2 hypointensity of the hepatic parenchyma is consistent with transfusional siderosis.  Gallbladder/Biliary: Gallbladder is unremarkable. No evidence of biliary ductal dilatation with common bile duct measuring 6 mm in diameter. No evidence of choledocholithiasis.  Pancreas: Diffuse pancreatic swelling is seen with mild peripancreatic edema, consistent with acute pancreatitis. No evidence of pancreatic mass. No evidence of pancreatic ductal dilatation or pancreas divisum.  Spleen: No evidence of splenomegaly. 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  Adrenal Glands:  No mass identified.  Kidneys: No masses identified. No evidence of hydronephrosis. Diffuse T2 hyperintensity suspicious for HIV nephropathy or other medical renal disease.  Lymph Nodes:  No pathologically enlarged lymph nodes identified.  Bowel: Mild diffuse gastric wall thickening, likely secondary to pancreatitis.  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  Musculoskeletal:  No suspicious bone lesions identified.  Other:  Small bilateral pleural effusions noted.  IMPRESSION: Mild acute pancreatitis. No evidence of pancreatic mass, pseudocysts, or pancreatic ductal dilatation.  No evidence of biliary ductal dilatation or choledocholithiasis.  Findings consistent with transfusional siderosis, and HIV nephropathy or other medical renal disease.   Electronically Signed   By: Earle Gell M.D.   On: 03/30/2014 08:23    Assessment / Plan: *68 y.o. male with ARF, elevated amylase/lipase (MRCP does actually show mild pancreatitis).  Previous HIV meds were changed to stribild which contains elvitegravir (per epocrates drug index this can cause amylase, lipase elevation), tenofovir (can cause pancreatitis, nephrotoxicity), cobicistat and emtricitabine. His CR is very high (from normal a month prior), renal failure can also cause elevations in amylase, lipase.  Lipase pending for today.  Was  tolerating a diet prior to MRCP so I think that we can safely put back on a low fat diet today.    LOS: 8 days   ZEHR, JESSICA D.  03/30/2014, 9:27 AM  Pager number [phone]  ________________________________________________________________________  [facility] MD note:  I personally examined the patient, reviewed the data and agree with the assessment and plan described above.  MRI was helpful.  Shows that he truly does have pancreatitis, mild.  No CBD stones.  Etiology of pancreatitis is presumed to be from certain HIV meds. His ID doctor is aware and is not planning to restart him on that class of meds again.  Clinically he is doing well (eating, no abd pains) but Cr is still elevated.  He should be encouraged to eat, ambulate as much as possible.  No plans for further GI testing.  Please call, page if any further question or concerns. He will need to schedule follow up with me in 6-7 weeks after d/c.     Owens Loffler, MD [facility] Pager [phone]

## 2014-03-30 NOTE — Assessment & Plan Note (Signed)
Rx zofran.  Increase fluid intake.  Ginger ale may also be helpful with nausea.  Diet ofr Diarrhea given.  Immodium PRN.  Will obtain labs giving extensive history.  Some concern for dehydration.  Labs ordered STAT but patient instructed to proceed to the ER if still unable to tolerate fluids with Zofran.

## 2014-03-31 LAB — MAGNESIUM: Magnesium: 2.5 mg/dL (ref 1.5–2.5)

## 2014-03-31 LAB — RENAL FUNCTION PANEL
ANION GAP: 18 — AB (ref 5–15)
Albumin: 2.5 g/dL — ABNORMAL LOW (ref 3.5–5.2)
BUN: 82 mg/dL — AB (ref 6–23)
CHLORIDE: 99 meq/L (ref 96–112)
CO2: 20 mEq/L (ref 19–32)
Calcium: 8.4 mg/dL (ref 8.4–10.5)
Creatinine, Ser: 6.9 mg/dL — ABNORMAL HIGH (ref 0.50–1.35)
GFR calc Af Amer: 9 mL/min — ABNORMAL LOW (ref >90)
GFR, EST NON AFRICAN AMERICAN: 7 mL/min — AB (ref >90)
Glucose, Bld: 156 mg/dL — ABNORMAL HIGH (ref 70–99)
POTASSIUM: 4.4 meq/L (ref 3.7–5.3)
Phosphorus: 5.4 mg/dL — ABNORMAL HIGH (ref 2.3–4.6)
Sodium: 137 mEq/L (ref 137–147)

## 2014-03-31 LAB — GLUCOSE, CAPILLARY
GLUCOSE-CAPILLARY: 158 mg/dL — AB (ref 70–99)
Glucose-Capillary: 302 mg/dL — ABNORMAL HIGH (ref 70–99)
Glucose-Capillary: 164 mg/dL — ABNORMAL HIGH (ref 70–99)
Glucose-Capillary: 186 mg/dL — ABNORMAL HIGH (ref 70–99)

## 2014-03-31 MED ORDER — INSULIN ASPART 100 UNIT/ML ~~LOC~~ SOLN
3.0000 [IU] | Freq: Three times a day (TID) | SUBCUTANEOUS | Status: DC
  Administered 2014-03-31 – 2014-04-10 (×26): 3 [IU] via SUBCUTANEOUS

## 2014-03-31 MED ORDER — FUROSEMIDE 10 MG/ML IJ SOLN
40.0000 mg | Freq: Once | INTRAMUSCULAR | Status: AC
  Administered 2014-03-31: 40 mg via INTRAVENOUS
  Filled 2014-03-31: qty 4

## 2014-03-31 MED ORDER — DOXAZOSIN MESYLATE 1 MG PO TABS
1.0000 mg | ORAL_TABLET | Freq: Every day | ORAL | Status: DC
  Administered 2014-03-31 – 2014-04-09 (×10): 1 mg via ORAL
  Filled 2014-03-31 (×11): qty 1

## 2014-03-31 MED ORDER — PANTOPRAZOLE SODIUM 40 MG PO TBEC
40.0000 mg | DELAYED_RELEASE_TABLET | Freq: Every day | ORAL | Status: DC
  Administered 2014-03-31 – 2014-04-09 (×10): 40 mg via ORAL
  Filled 2014-03-31 (×10): qty 1

## 2014-03-31 NOTE — Progress Notes (Addendum)
Patient ID: [name], male   DOB: Aug 26, 1945, 68 y.o.   MRN: [id number]  [facility] Progress Note   Assessment/ Plan:   1. ARF:  Continues to have fair urine output. Renal function unfortunately without any evidence of recovery. No acute dialysis needs noted today-patient bothered by his femoral dialysis catheter that limits his mobility. We'll discontinue his femoral dialysis catheter and have an IJ catheter placed in this dialysis needed again. Continue to monitor him for renal recovery. Try furosemide. 2. Metabolic Acidosis: Improved with dialysis, continue to monitor 3. Pancreatitis- clinically improving and now started on clear liquids that he is tolerating without abdominal pain (labs reveal elevated amylase/lipase) 4. Hyponatremia- improved with dialysis, not on fluid restriction 5. HIV- per ID 6. HTN- blood pressure slightly elevated, on amlodipine and atenolol, start doxazosin 1 mg each bedtime  Subjective:   Reports to be feeling better-mobility/comfort limited by his femoral dialysis catheter    Objective:   BP 159/80  Pulse 60  Temp(Src) 98.5 F (36.9 C) (Oral)  Resp 17  Ht 5\' 3"  (1.6 m)  Wt 51.5 kg (113 lb 8.6 oz)  BMI 20.12 kg/m2  SpO2 98%  Intake/Output Summary (Last 24 hours) at 03/31/14 1009 Last data filed at 03/31/14 0950  Gross per 24 hour  Intake   1140 ml  Output    625 ml  Net    515 ml   Weight change:   Physical Exam: Gen: Comfortably resting in bed, watching television CVS: Pulse regular rate and rhythm, S1 and S2 normal Resp: Coarse breath sounds bilaterally, no rales/wheeze Abd: Soft, flat, mildly tender over the epigastric area Ext: No lower extremity edema  Imaging: Mr Abdomen Mrcp Wo Cm  03/30/2014   CLINICAL DATA:  Pancreatitis. Mild biliary ductal dilatation seen on ultrasound. HIV.  EXAM: MRI ABDOMEN WITHOUT  (INCLUDING MRCP)  TECHNIQUE: Multiplanar multisequence MR imaging of the abdomen was performed. Heavily  T2-weighted images of the biliary and pancreatic ducts were obtained, and three-dimensional MRCP images were rendered by post processing.  COMPARISON:  CT on 03/22/14  FINDINGS: Liver: No mass identified. Diffuse T2 hypointensity of the hepatic parenchyma is consistent with transfusional siderosis.  Gallbladder/Biliary: Gallbladder is unremarkable. No evidence of biliary ductal dilatation with common bile duct measuring 6 mm in diameter. No evidence of choledocholithiasis.  Pancreas: Diffuse pancreatic swelling is seen with mild peripancreatic edema, consistent with acute pancreatitis. No evidence of pancreatic mass. No evidence of pancreatic ductal dilatation or pancreas divisum.  Spleen: No evidence of splenomegaly. 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  Adrenal Glands:  No mass identified.  Kidneys: No masses identified. No evidence of hydronephrosis. Diffuse T2 hyperintensity suspicious for HIV nephropathy or other medical renal disease.  Lymph Nodes:  No pathologically enlarged lymph nodes identified.  Bowel: Mild diffuse gastric wall thickening, likely secondary to pancreatitis.  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  Musculoskeletal:  No suspicious bone lesions identified.  Other:  Small bilateral pleural effusions noted.  IMPRESSION: Mild acute pancreatitis. No evidence of pancreatic mass, pseudocysts, or pancreatic ductal dilatation.  No evidence of biliary ductal dilatation or choledocholithiasis.  Findings consistent with transfusional siderosis, and HIV nephropathy or other medical renal disease.   Electronically Signed   By: Earle Gell M.D.   On: 03/30/2014 08:23   Mr 3d Recon At Scanner  03/30/2014   CLINICAL DATA:  Pancreatitis. Mild biliary ductal dilatation seen on ultrasound. HIV.  EXAM: MRI ABDOMEN WITHOUT  (  INCLUDING MRCP)  TECHNIQUE: Multiplanar multisequence MR imaging of the abdomen was performed. Heavily T2-weighted images  of the biliary and pancreatic ducts were obtained, and three-dimensional MRCP images were rendered by post processing.  COMPARISON:  CT on 03/22/14  FINDINGS: Liver: No mass identified. Diffuse T2 hypointensity of the hepatic parenchyma is consistent with transfusional siderosis.  Gallbladder/Biliary: Gallbladder is unremarkable. No evidence of biliary ductal dilatation with common bile duct measuring 6 mm in diameter. No evidence of choledocholithiasis.  Pancreas: Diffuse pancreatic swelling is seen with mild peripancreatic edema, consistent with acute pancreatitis. No evidence of pancreatic mass. No evidence of pancreatic ductal dilatation or pancreas divisum.  Spleen: No evidence of splenomegaly. 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  Adrenal Glands:  No mass identified.  Kidneys: No masses identified. No evidence of hydronephrosis. Diffuse T2 hyperintensity suspicious for HIV nephropathy or other medical renal disease.  Lymph Nodes:  No pathologically enlarged lymph nodes identified.  Bowel: Mild diffuse gastric wall thickening, likely secondary to pancreatitis.  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  Musculoskeletal:  No suspicious bone lesions identified.  Other:  Small bilateral pleural effusions noted.  IMPRESSION: Mild acute pancreatitis. No evidence of pancreatic mass, pseudocysts, or pancreatic ductal dilatation.  No evidence of biliary ductal dilatation or choledocholithiasis.  Findings consistent with transfusional siderosis, and HIV nephropathy or other medical renal disease.   Electronically Signed   By: Earle Gell M.D.   On: 03/30/2014 08:23    Labs: BMET  Recent Labs Lab 03/25/14 0411 03/25/14 1020 03/26/14 0350 03/27/14 0521 03/28/14 0620 03/30/14 0849 03/31/14 0510  NA 139 139 139 136* 136* 141 137  K 3.7 3.9 3.6* 3.7 4.4 4.2 4.4  CL 92* 93* 96 96 96 100 99  CO2 16* 19 18* 19 19 17* 20  GLUCOSE 146* 88 120* 158*  141* 126* 156*  BUN 39* 47* 35* 58* 78* 65* 82*  CREATININE 4.68* 5.48* 4.26* 6.04* 7.48* 6.08* 6.90*  CALCIUM 7.2* 7.1* 7.5* 7.8* 8.1* 8.5 8.4  PHOS  --   --   --   --   --  5.8* 5.4*   CBC  Recent Labs Lab 03/25/14 0411 03/27/14 0521 03/29/14 0605 03/30/14 0849  WBC 9.9 4.2 11.6* 7.9  NEUTROABS 8.9*  --   --   --   HGB 11.0* 9.5* 10.1* 9.4*  HCT 29.8* 26.5* 29.5* 27.5*  MCV 92.8 95.7 92.8 97.2  PLT 89* 93* 103* 100*   Medications:    . amLODipine  10 mg Oral QHS  . antiseptic oral rinse  7 mL Mouth Rinse q12n4p  . atenolol  50 mg Oral Daily  . chlorhexidine  15 mL Mouth Rinse BID  . insulin aspart  0-15 Units Subcutaneous TID WC  . insulin aspart  0-5 Units Subcutaneous QHS  . pantoprazole (PROTONIX) IV  40 mg Intravenous QHS  . sodium bicarbonate  650 mg Oral TID   Elmarie Shiley, MD 03/31/2014, 10:09 AM

## 2014-03-31 NOTE — Progress Notes (Signed)
Removed and returned telemetry box 26.  CCMD notified

## 2014-03-31 NOTE — Progress Notes (Signed)
[facility] Infect[facility]  Date of Admission:  03/22/2014  Antibiotics: none  Subjective: No complaints  Objective: Temp:  [98 F (36.7 C)-98.7 F (37.1 C)] 98.5 F (36.9 C) (08/28 0454) Pulse Rate:  [60-62] 60 (08/28 0454) Resp:  [17-18] 17 (08/28 0454) BP: (153-159)/(72-80) 159/80 mmHg (08/28 0454) SpO2:  [97 %-99 %] 98 % (08/28 0454) Weight:  [113 lb 8.6 oz (51.5 kg)] 113 lb 8.6 oz (51.5 kg) (08/27 2135)  General: awake, alert, nad Skin: no rashes Lungs: CTA B Cor: RRR Abdomen: soft, nt, nd Ext: no edema  Lab Results Lab Results  Component Value Date   WBC 7.9 03/30/2014   HGB 9.4* 03/30/2014   HCT 27.5* 03/30/2014   MCV 97.2 03/30/2014   PLT 100* 03/30/2014    Lab Results  Component Value Date   CREATININE 6.90* 03/31/2014   BUN 82* 03/31/2014   NA 137 03/31/2014   K 4.4 03/31/2014   CL 99 03/31/2014   CO2 20 03/31/2014    Lab Results  Component Value Date   ALT 12 03/28/2014   AST 22 03/28/2014   ALKPHOS 61 03/28/2014   BILITOT 0.5 03/28/2014      Microbiology: Recent Results (from the past 240 hour(s))  MRSA PCR SCREENING     Status: None   Collection Time    03/23/14  1:50 AM      Result Value Ref Range Status   MRSA by PCR NEGATIVE  NEGATIVE Final   Comment:            The GeneXpert MRSA Assay (FDA     approved for NASAL specimens     only), is one component of a     comprehensive MRSA colonization     surveillance program. It is not     intended to diagnose MRSA     infection nor to guide or     monitor treatment for     MRSA infections.  URINE CULTURE     Status: None   Collection Time    03/23/14  2:56 AM      Result Value Ref Range Status   Specimen Description URINE, CATHETERIZED   Final   Special Requests NONE   Final   Culture  Setup Time     Final   Value: 03/23/2014 09:02     Performed at [facility] Count     Final   Value: NO GROWTH     Performed at [facility]   Culture     Final   Value: NO GROWTH     Performed at [facility]   Report Status 03/24/2014 FINAL   Final  CULTURE, BLOOD (ROUTINE X 2)     Status: None   Collection Time    03/23/14  3:00 AM      Result Value Ref Range Status   Specimen Description BLOOD LEFT ARM   Final   Special Requests BOTTLES DRAWN AEROBIC AND ANAEROBIC 10CC   Final   Culture  Setup Time     Final   Value: 03/23/2014 08:48     Performed at [facility]   Culture     Final   Value: NO GROWTH 5 DAYS     Performed at [facility]   Report Status 03/29/2014 FINAL   Final  CULTURE, BLOOD (ROUTINE X 2)     Status: None   Collection Time    03/23/14  3:06 AM      Result Value Ref Range Status   Specimen Description BLOOD RIGHT HAND   Final   Special Requests BOTTLES DRAWN AEROBIC AND ANAEROBIC 5CC   Final   Culture  Setup Time     Final   Value: 03/23/2014 08:47     Performed at [facility]   Culture     Final   Value: NO GROWTH 5 DAYS     Performed at [facility]   Report Status 03/29/2014 FINAL   Final  AFB CULTURE WITH SMEAR     Status: None   Collection Time    03/23/14 12:00 PM      Result Value Ref Range Status   Specimen Description STOOL   Final   Special Requests NONE   Final   Acid Fast Smear     Final   Value: NO ACID FAST BACILLI SEEN     Performed at [facility]   Culture     Final   Value: CULTURE WILL BE EXAMINED FOR 6 WEEKS BEFORE ISSUING A FINAL REPORT     Performed at [facility]   Report Status PENDING   Incomplete  CLOSTRIDIUM DIFFICILE BY PCR     Status: None   Collection Time    03/24/14  5:24 AM      Result Value Ref Range Status   C difficile by pcr NEGATIVE  NEGATIVE Final  CULTURE, EXPECTORATED SPUTUM-ASSESSMENT     Status: None   Collection Time    03/24/14  5:24 AM      Result Value Ref Range Status   Specimen Description SPUTUM   Final   Special Requests NONE   Final   Sputum evaluation     Final   Value: MICROSCOPIC FINDINGS  SUGGEST THAT THIS SPECIMEN IS NOT REPRESENTATIVE OF LOWER RESPIRATORY SECRETIONS. PLEASE RECOLLECT.     RESULT CALLED TO, READ BACK BY AND VERIFIED WITHWilson Singer RN [id number] [phone] GREEN R   Report Status 03/24/2014 FINAL   Final    Studies/Results: Mr Abdomen Mrcp Wo Cm  03/30/2014   CLINICAL DATA:  Pancreatitis. Mild biliary ductal dilatation seen on ultrasound. HIV.  EXAM: MRI ABDOMEN WITHOUT  (INCLUDING MRCP)  TECHNIQUE: Multiplanar multisequence MR imaging of the abdomen was performed. Heavily T2-weighted images of the biliary and pancreatic ducts were obtained, and three-dimensional MRCP images were rendered by post processing.  COMPARISON:  CT on 03/22/14  FINDINGS: Liver: No mass identified. Diffuse T2 hypointensity of the hepatic parenchyma is consistent with transfusional siderosis.  Gallbladder/Biliary: Gallbladder is unremarkable. No evidence of biliary ductal dilatation with common bile duct measuring 6 mm in diameter. No evidence of choledocholithiasis.  Pancreas: Diffuse pancreatic swelling is seen with mild peripancreatic edema, consistent with acute pancreatitis. No evidence of pancreatic mass. No evidence of pancreatic ductal dilatation or pancreas divisum.  Spleen: No evidence of splenomegaly. 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  Adrenal Glands:  No mass identified.  Kidneys: No masses identified. No evidence of hydronephrosis. Diffuse T2 hyperintensity suspicious for HIV nephropathy or other medical renal disease.  Lymph Nodes:  No pathologically enlarged lymph nodes identified.  Bowel: Mild diffuse gastric wall thickening, likely secondary to pancreatitis.  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  Musculoskeletal:  No suspicious bone lesions identified.  Other:  Small bilateral pleural effusions noted.  IMPRESSION: Mild acute pancreatitis. No evidence of pancreatic mass, pseudocysts, or pancreatic ductal dilatation.  No  evidence of biliary ductal dilatation or choledocholithiasis.  Findings consistent with transfusional siderosis, and HIV nephropathy or other medical renal disease.   Electronically Signed   By: Earle Gell M.D.   On: 03/30/2014 08:23   Mr 3d Recon At Scanner  03/30/2014   CLINICAL DATA:  Pancreatitis. Mild biliary ductal dilatation seen on ultrasound. HIV.  EXAM: MRI ABDOMEN WITHOUT  (INCLUDING MRCP)  TECHNIQUE: Multiplanar multisequence MR imaging of the abdomen was performed. Heavily T2-weighted images of the biliary and pancreatic ducts were obtained, and three-dimensional MRCP images were rendered by post processing.  COMPARISON:  CT on 03/22/14  FINDINGS: Liver: No mass identified. Diffuse T2 hypointensity of the hepatic parenchyma is consistent with transfusional siderosis.  Gallbladder/Biliary: Gallbladder is unremarkable. No evidence of biliary ductal dilatation with common bile duct measuring 6 mm in diameter. No evidence of choledocholithiasis.  Pancreas: Diffuse pancreatic swelling is seen with mild peripancreatic edema, consistent with acute pancreatitis. No evidence of pancreatic mass. No evidence of pancreatic ductal dilatation or pancreas divisum.  Spleen: No evidence of splenomegaly. 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  Adrenal Glands:  No mass identified.  Kidneys: No masses identified. No evidence of hydronephrosis. Diffuse T2 hyperintensity suspicious for HIV nephropathy or other medical renal disease.  Lymph Nodes:  No pathologically enlarged lymph nodes identified.  Bowel: Mild diffuse gastric wall thickening, likely secondary to pancreatitis.  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  Musculoskeletal:  No suspicious bone lesions identified.  Other:  Small bilateral pleural effusions noted.  IMPRESSION: Mild acute pancreatitis. No evidence of pancreatic mass, pseudocysts, or pancreatic ductal dilatation.  No evidence of biliary  ductal dilatation or choledocholithiasis.  Findings consistent with transfusional siderosis, and HIV nephropathy or other medical renal disease.   Electronically Signed   By: Earle Gell M.D.   On: 03/30/2014 08:23    Assessment/Plan: 1)  Acute pancreatitis - MRCP without obvious etiology, noted pancreatitis.  Possible that it is NRTIs but would be rare.  Will do an NRTI-free once AKI resolves.    2) HIV - will restart new ARV regimen after discharge.    Dr. Tommy Medal available over the weekend if needed  Scharlene Gloss, [facility] [place] [email or web address] [phone] pager   [phone] cell 03/31/2014, 11:47 AM

## 2014-03-31 NOTE — Progress Notes (Signed)
[facility] PROGRESS NOTE  [name] HXT:[id number] DOB: [birth year] DOA: 03/22/2014 PCP: Penni Homans, MD  Brief HPI: 68 year old male with HIV on STRIBILD presented to [place] c/o emesis, diarrhea, and generalized weakness x 3 days. In ED found to be in renal failure with elevated lipase. Transferred to [facility] for ICU admission. Patient subsequently [facility]. Patient admitted with respiratory failure, renal failure, and acute pancreatitis. He is undergoing intermittent dialysis. His respiratory failure has improved after volume removed with dialysis. Pancreatitis is improved. Renal function is slow to improve.   Assessment/Plan: Acute pancreatitis He is much improved. This was possibly due to STRIBILD (hx of this with other ARV meds). Abd [place] > no obvious pancreatic abnormality, similar dilated CBD compared with 4/15. HIV medications can cause elevation of amylase and lipase. MRI, MRCP as below. GI has signed off.  Acute on chronic respiratory failure  Uncompensated met acidosis pulm edema? Vs pancreatitis induced ALI -Repeated chest x ray: 8-24: Persistent asymmetric airspace process, likely bilateral infiltrates. No need for antibiotics per ID.  -Had fluids removed with dialysis Improved.   Acute renal failure, oliguric , High AG metabolic acidosis (suspect lactic, uremic) Slow progress. Thought to be secondary to volume depletion, diuretics, SIRS. Had dialysis 8/21, 8/22, 8/25. Nephrology following closely. Creatinine higher today. Bicarb is better. Continue Sod bicarb. Further management per Nephrology.  DM2 uncontrolled CBG's increasing. Was on Metformin at home. Will add meal coverage. May need to utilize Lantus. A1c was 7.5 in July.  Chronic Diarrhea For last 6 months. C diff was negative.   Thrombocytopenia Improved.   HIV Holding HIV medications. ID following and will resume meds after discharge.   COPD without evidence of exacerbation.   Stable  Hypertension BP better but still high. Will not be too aggressive as he may need dialysis. Increased Amlodipine dose 8/26. Continue atenolol.   Acute encephalopathy in setting of acidosis Resolved  Hyperkalemia  Resolved  DVT Prophylaxis: SCD's Code Status: Full Code.  Family Communication: Discussed with patient.  Disposition Plan: Await improvement in renal function.    Consultants:  Renal  ID  GI.   Procedures: 8/20 [place] abdo>>>Visualized pancreas has a normal sonographic appearance. No biliary sludge or gallstones. Upper limits of normal to mildly dilated CBD, the similar to the abdomen ultrasound in April. No intrahepatic biliary ductal dilatation identified   8/21 TTE > Mild LVH, EF 60%, PAP 81mHg  Antibiotics:  none  HPI/Subjective: He continues to feel well. Denies any pain. Making urine.   Objective: Filed Vitals:   03/31/14 0454  BP: 159/80  Pulse: 60  Temp: 98.5 F (36.9 C)  Resp: 17    Intake/Output Summary (Last 24 hours) at 03/31/14 0856 Last data filed at 03/31/14 01655 Gross per 24 hour  Intake    680 ml  Output    825 ml  Net   -145 ml   Filed Weights   03/28/14 1729 03/28/14 2202 03/30/14 2135  Weight: 53.4 kg (117 lb 11.6 oz) 52.753 kg (116 lb 4.8 oz) 51.5 kg (113 lb 8.6 oz)    Exam:   General:  Alert in no distress.   Cardiovascular: S 1, S 2 RRR  Respiratory: CTA bilaterally  Abdomen: BS present, soft, NT  Musculoskeletal: no edema.   Data Reviewed: Basic Metabolic Panel:  Recent Labs Lab 03/26/14 0350 03/27/14 0521 03/28/14 0620 03/30/14 0849 03/31/14 0510  NA 139 136* 136* 141 137  K 3.6* 3.7 4.4 4.2  4.4  CL 96 96 96 100 99  CO2 18* 19 19 17* 20  GLUCOSE 120* 158* 141* 126* 156*  BUN 35* 58* 78* 65* 82*  CREATININE 4.26* 6.04* 7.48* 6.08* 6.90*  CALCIUM 7.5* 7.8* 8.1* 8.5 8.4  MG  --   --   --  2.5 2.5  PHOS  --   --   --  5.8* 5.4*   Liver Function Tests:  Recent Labs Lab 03/25/14 0411  03/27/14 0521 03/28/14 0620 03/30/14 0849 03/31/14 0510  AST '31 21 22  ' --   --   ALT '31 14 12  ' --   --   ALKPHOS 54 60 61  --   --   BILITOT 0.5 0.5 0.5  --   --   PROT 5.3* 5.4* 5.9*  --   --   ALBUMIN 2.4* 2.2* 2.2* 2.4* 2.5*    Recent Labs Lab 03/25/14 0411 03/26/14 0350 03/27/14 0521 03/28/14 0620 03/30/14 0849  LIPASE 250* 542* 1420* 1481* 740*  AMYLASE  --   --  847* 1050*  --    CBC:  Recent Labs Lab 03/25/14 0411 03/27/14 0521 03/29/14 0605 03/30/14 0849  WBC 9.9 4.2 11.6* 7.9  NEUTROABS 8.9*  --   --   --   HGB 11.0* 9.5* 10.1* 9.4*  HCT 29.8* 26.5* 29.5* 27.5*  MCV 92.8 95.7 92.8 97.2  PLT 89* 93* 103* 100*   Cardiac Enzymes: No results found for this basename: CKTOTAL, CKMB, CKMBINDEX, TROPONINI,  in the last 168 hours CBG:  Recent Labs Lab 03/30/14 0824 03/30/14 1124 03/30/14 1659 03/30/14 2109 03/31/14 0746  GLUCAP 130* 241* 167* 221* 158*    Recent Results (from the past 240 hour(s))  MRSA PCR SCREENING     Status: None   Collection Time    03/23/14  1:50 AM      Result Value Ref Range Status   MRSA by PCR NEGATIVE  NEGATIVE Final   Comment:            The GeneXpert MRSA Assay (FDA     approved for NASAL specimens     only), is one component of a     comprehensive MRSA colonization     surveillance program. It is not     intended to diagnose MRSA     infection nor to guide or     monitor treatment for     MRSA infections.  URINE CULTURE     Status: None   Collection Time    03/23/14  2:56 AM      Result Value Ref Range Status   Specimen Description URINE, CATHETERIZED   Final   Special Requests NONE   Final   Culture  Setup Time     Final   Value: 03/23/2014 09:02     Performed at [place] Count     Final   Value: NO GROWTH     Performed at [facility]   Culture     Final   Value: NO GROWTH     Performed at [facility]   Report Status 03/24/2014 FINAL   Final  CULTURE, BLOOD  (ROUTINE X 2)     Status: None   Collection Time    03/23/14  3:00 AM      Result Value Ref Range Status   Specimen Description BLOOD LEFT ARM   Final   Special Requests BOTTLES DRAWN AEROBIC AND ANAEROBIC 10CC  Final   Culture  Setup Time     Final   Value: 03/23/2014 08:48     Performed at [facility]   Culture     Final   Value: NO GROWTH 5 DAYS     Performed at [facility]   Report Status 03/29/2014 FINAL   Final  CULTURE, BLOOD (ROUTINE X 2)     Status: None   Collection Time    03/23/14  3:06 AM      Result Value Ref Range Status   Specimen Description BLOOD RIGHT HAND   Final   Special Requests BOTTLES DRAWN AEROBIC AND ANAEROBIC 5CC   Final   Culture  Setup Time     Final   Value: 03/23/2014 08:47     Performed at [facility]   Culture     Final   Value: NO GROWTH 5 DAYS     Performed at [facility]   Report Status 03/29/2014 FINAL   Final  AFB CULTURE WITH SMEAR     Status: None   Collection Time    03/23/14 12:00 PM      Result Value Ref Range Status   Specimen Description STOOL   Final   Special Requests NONE   Final   Acid Fast Smear     Final   Value: NO ACID FAST BACILLI SEEN     Performed at [facility]   Culture     Final   Value: CULTURE WILL BE EXAMINED FOR 6 WEEKS BEFORE ISSUING A FINAL REPORT     Performed at [facility]   Report Status PENDING   Incomplete  CLOSTRIDIUM DIFFICILE BY PCR     Status: None   Collection Time    03/24/14  5:24 AM      Result Value Ref Range Status   C difficile by pcr NEGATIVE  NEGATIVE Final  CULTURE, EXPECTORATED SPUTUM-ASSESSMENT     Status: None   Collection Time    03/24/14  5:24 AM      Result Value Ref Range Status   Specimen Description SPUTUM   Final   Special Requests NONE   Final   Sputum evaluation     Final   Value: MICROSCOPIC FINDINGS SUGGEST THAT THIS SPECIMEN IS NOT REPRESENTATIVE OF LOWER RESPIRATORY SECRETIONS. PLEASE RECOLLECT.     RESULT  CALLED TO, READ BACK BY AND VERIFIED WITHWilson Singer RN [phone] GREEN R   Report Status 03/24/2014 FINAL   Final     Studies: Mr Abdomen Mrcp Wo Cm  03/30/2014   CLINICAL DATA:  Pancreatitis. Mild biliary ductal dilatation seen on ultrasound. HIV.  EXAM: MRI ABDOMEN WITHOUT  (INCLUDING MRCP)  TECHNIQUE: Multiplanar multisequence MR imaging of the abdomen was performed. Heavily T2-weighted images of the biliary and pancreatic ducts were obtained, and three-dimensional MRCP images were rendered by post processing.  COMPARISON:  CT on 03/22/14  FINDINGS: Liver: No mass identified. Diffuse T2 hypointensity of the hepatic parenchyma is consistent with transfusional siderosis.  Gallbladder/Biliary: Gallbladder is unremarkable. No evidence of biliary ductal dilatation with common bile duct measuring 6 mm in diameter. No evidence of choledocholithiasis.  Pancreas: Diffuse pancreatic swelling is seen with mild peripancreatic edema, consistent with acute pancreatitis. No evidence of pancreatic mass. No evidence of pancreatic ductal dilatation or pancreas divisum.  Spleen: No evidence of splenomegaly. 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  Adrenal Glands:  No mass identified.  Kidneys: No masses identified.  No evidence of hydronephrosis. Diffuse T2 hyperintensity suspicious for HIV nephropathy or other medical renal disease.  Lymph Nodes:  No pathologically enlarged lymph nodes identified.  Bowel: Mild diffuse gastric wall thickening, likely secondary to pancreatitis.  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  Musculoskeletal:  No suspicious bone lesions identified.  Other:  Small bilateral pleural effusions noted.  IMPRESSION: Mild acute pancreatitis. No evidence of pancreatic mass, pseudocysts, or pancreatic ductal dilatation.  No evidence of biliary ductal dilatation or choledocholithiasis.  Findings consistent with transfusional siderosis, and HIV  nephropathy or other medical renal disease.   Electronically Signed   By: Earle Gell M.D.   On: 03/30/2014 08:23   Mr 3d Recon At [facility]  03/30/2014   CLINICAL DATA:  Pancreatitis. Mild biliary ductal dilatation seen on ultrasound. HIV.  EXAM: MRI ABDOMEN WITHOUT  (INCLUDING MRCP)  TECHNIQUE: Multiplanar multisequence MR imaging of the abdomen was performed. Heavily T2-weighted images of the biliary and pancreatic ducts were obtained, and three-dimensional MRCP images were rendered by post processing.  COMPARISON:  CT on 03/22/14  FINDINGS: Liver: No mass identified. Diffuse T2 hypointensity of the hepatic parenchyma is consistent with transfusional siderosis.  Gallbladder/Biliary: Gallbladder is unremarkable. No evidence of biliary ductal dilatation with common bile duct measuring 6 mm in diameter. No evidence of choledocholithiasis.  Pancreas: Diffuse pancreatic swelling is seen with mild peripancreatic edema, consistent with acute pancreatitis. No evidence of pancreatic mass. No evidence of pancreatic ductal dilatation or pancreas divisum.  Spleen: No evidence of splenomegaly. 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  Adrenal Glands:  No mass identified.  Kidneys: No masses identified. No evidence of hydronephrosis. Diffuse T2 hyperintensity suspicious for HIV nephropathy or other medical renal disease.  Lymph Nodes:  No pathologically enlarged lymph nodes identified.  Bowel: Mild diffuse gastric wall thickening, likely secondary to pancreatitis.  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  Musculoskeletal:  No suspicious bone lesions identified.  Other:  Small bilateral pleural effusions noted.  IMPRESSION: Mild acute pancreatitis. No evidence of pancreatic mass, pseudocysts, or pancreatic ductal dilatation.  No evidence of biliary ductal dilatation or choledocholithiasis.  Findings consistent with transfusional siderosis, and HIV nephropathy or  other medical renal disease.   Electronically Signed   By: Earle Gell M.D.   On: 03/30/2014 08:23    Scheduled Meds: . amLODipine  10 mg Oral QHS  . antiseptic oral rinse  7 mL Mouth Rinse q12n4p  . atenolol  50 mg Oral Daily  . chlorhexidine  15 mL Mouth Rinse BID  . insulin aspart  0-15 Units Subcutaneous TID WC  . insulin aspart  0-5 Units Subcutaneous QHS  . pantoprazole (PROTONIX) IV  40 mg Intravenous QHS  . sodium bicarbonate  650 mg Oral TID   Continuous Infusions: . sodium chloride Stopped (03/26/14 1330)    Active Problems:   AKI (acute kidney injury)   Metabolic acidosis   Hyperkalemia   Hyponatremia   HIV disease   Acute gastroenteritis   Protein-calorie malnutrition, severe   Acute kidney injury    Time spent: 30 minutes.     [facility] Pager [phone].   If 7PM-7AM, please contact night-coverage at [email or web address], password [facility] 03/31/2014, 8:56 AM  LOS: 9 days

## 2014-03-31 NOTE — Care Management Note (Signed)
CARE MANAGEMENT NOTE 03/31/2014  Patient:  [name], [name]   Account Number:  [id number]  Date Initiated:  03/31/2014  Documentation initiated by:  Rosco Harriott  Subjective/Objective Assessment:   CM following for progression and d/c planning.     Action/Plan:   03/31/2014 Met with pt re d/c needs continues with acute vs chronic renal failure. IM given for pt to review.   Anticipated DC Date:     Anticipated DC Plan:  [place]         Choice offered to / List presented to:             Status of service:   Medicare Important Message given?  YES (If response is "NO", the following Medicare IM given date fields will be blank) Date Medicare IM given:  03/31/2014 Medicare IM given by:  Alyza Artiaga Date Additional Medicare IM given:   Additional Medicare IM given by:    Discharge Disposition:    Per UR Regulation:    If discussed at Long Length of Stay Meetings, dates discussed:    Comments:

## 2014-03-31 NOTE — Progress Notes (Signed)
Inpatient Diabetes Program Recommendations  AACE/ADA: New Consensus Statement on Inpatient Glycemic Control (2013)  Target Ranges:  Prepandial:   less than 140 mg/dL      Peak postprandial:   less than 180 mg/dL (1-2 hours)      Critically ill patients:  140 - 180 mg/dL     Results for COLTER, MAGOWAN (MRN [id number]) as of 03/31/2014 11:59  Ref. Range 03/30/2014 08:24 03/30/2014 11:24 03/30/2014 16:59 03/30/2014 21:09  Glucose-Capillary Latest Range: 70-99 mg/dL 130 (H) 241 (H) 167 (H) 221 (H)    Results for BOBAK, OGUINN (MRN [id number]) as of 03/31/2014 11:59  Ref. Range 03/31/2014 07:46 03/31/2014 11:47  Glucose-Capillary Latest Range: 70-99 mg/dL 158 (H) 302 (H)     Home DM Meds: Metformin 1000 mg bid  Patient having elevated postprandial glucose levels.  Ate 100% of breakfast this AM and CBG rose to 302 mg/dl before lunch.    MD- Please consider adding low dose Novolog Meal Coverage to current regimen- Novolog 3 units tid with meals     Will follow Wyn Quaker RN, MSN, CDE Diabetes Coordinator Inpatient Diabetes Program Team Pager: [phone] (8a-10p)

## 2014-04-01 LAB — RENAL FUNCTION PANEL
Albumin: 2.3 g/dL — ABNORMAL LOW (ref 3.5–5.2)
Anion gap: 18 — ABNORMAL HIGH (ref 5–15)
BUN: 97 mg/dL — ABNORMAL HIGH (ref 6–23)
CALCIUM: 8.2 mg/dL — AB (ref 8.4–10.5)
CO2: 19 mEq/L (ref 19–32)
Chloride: 100 mEq/L (ref 96–112)
Creatinine, Ser: 7.63 mg/dL — ABNORMAL HIGH (ref 0.50–1.35)
GFR calc non Af Amer: 7 mL/min — ABNORMAL LOW (ref >90)
GFR, EST AFRICAN AMERICAN: 8 mL/min — AB (ref >90)
Glucose, Bld: 149 mg/dL — ABNORMAL HIGH (ref 70–99)
Phosphorus: 6.2 mg/dL — ABNORMAL HIGH (ref 2.3–4.6)
Potassium: 4.4 mEq/L (ref 3.7–5.3)
Sodium: 137 mEq/L (ref 137–147)

## 2014-04-01 LAB — GLUCOSE, CAPILLARY
GLUCOSE-CAPILLARY: 150 mg/dL — AB (ref 70–99)
GLUCOSE-CAPILLARY: 273 mg/dL — AB (ref 70–99)
Glucose-Capillary: 107 mg/dL — ABNORMAL HIGH (ref 70–99)

## 2014-04-01 LAB — MAGNESIUM: MAGNESIUM: 2.3 mg/dL (ref 1.5–2.5)

## 2014-04-01 LAB — LIPASE, BLOOD: Lipase: 827 U/L — ABNORMAL HIGH (ref 11–59)

## 2014-04-01 NOTE — Progress Notes (Signed)
[facility] PROGRESS NOTE  [name] TKW:[id number] DOB: 1945-12-26 DOA: 03/22/2014 PCP: Penni Homans, MD  Brief HPI: 68 year old [name] with HIV on STRIBILD presented to [place] c/o emesis, diarrhea, and generalized weakness x 3 days. In ED found to be in renal failure with elevated lipase. Transferred to [facility] for ICU admission. Patient subsequently transfer to [facility]. Patient admitted with respiratory failure, renal failure, and acute pancreatitis. He is undergoing intermittent dialysis. His respiratory failure has improved after volume removed with dialysis. Pancreatitis is improved. Renal function is slow to improve.   Assessment/Plan: Acute renal failure, oliguric , High AG metabolic acidosis (suspect lactic, uremic) Creatinine continues to rise. Thought to be secondary to volume depletion, diuretics, SIRS. Had dialysis 8/21, 8/22, 8/25. Nephrology following closely. Continue Sod bicarb. Further management per Nephrology. Femoral dialysis catheter was removed 8/28. He may need new access if renal failure doesn't improve.  Acute pancreatitis He is much improved. This was possibly due to STRIBILD (hx of this with other ARV meds). Abd [place] > no obvious pancreatic abnormality, similar dilated CBD compared with 4/Christian. HIV medications can cause elevation of amylase and lipase. MRI, MRCP as below. GI has signed off.  Acute on chronic respiratory failure  Uncompensated met acidosis pulm edema? Vs pancreatitis induced ALI -Repeated chest x ray: 8-24: Persistent asymmetric airspace process, likely bilateral infiltrates. No need for antibiotics per ID.  -Had fluids removed with dialysis Resolved..   DM2 uncontrolled CBG's increasing. Was on Metformin at home which cannot be restarted due to renal failure. Improved some with meal coverage. May need to utilize Lantus. A1c was 7.5 in July.  Chronic Diarrhea For last 6 months. C diff was negative.   Thrombocytopenia Improved.    HIV Holding HIV medications as they could have caused pancreatitis. ID following and will resume meds after discharge.   COPD without evidence of exacerbation.  Stable  Hypertension BP better. Will not be too aggressive as he may need dialysis. Increased Amlodipine dose 8/26. Continue atenolol.   Acute encephalopathy in setting of acidosis Resolved  Hyperkalemia  Resolved  DVT Prophylaxis: SCD's Code Status: Full Code.  Family Communication: Discussed with patient.  Disposition Plan: Await improvement in renal function. Mobilize.   Consultants:  Nephrology  ID  GI.   Procedures: 8/20 [place] abdo>>>Visualized pancreas has a normal sonographic appearance. No biliary sludge or gallstones. Upper limits of normal to mildly dilated CBD, the similar to the abdomen ultrasound in April. No intrahepatic biliary ductal dilatation identified   8/21 TTE > Mild LVH, EF 60%, PAP 28mHg  Antibiotics:  none  HPI/Subjective: He feels fine. Denies complaints. Frustrated at long stay.   Objective: Filed Vitals:   08/29/Christian 0939  BP: 115/60  Pulse: 64  Temp: 97.5 F (36.4 C)  Resp: 20    Intake/Output Summary (Last 24 hours) at 08/29/Christian 1023 Last data filed at 08/29/Christian 0456  Gross per 24 hour  Intake    480 ml  Output   1275 ml  Net   -795 ml   Filed Weights   08/25/Christian 2202 08/27/Christian 2135 08/28/Christian 2038  Weight: 52.753 kg (116 lb 4.8 oz) 51.5 kg (113 lb 8.6 oz) 49.5 kg (109 lb 2 oz)    Exam:   General:  Alert in no distress.   Cardiovascular: S 1, S 2 RRR  Respiratory: CTA bilaterally. No crackles  Abdomen: BS present, soft, NT  Musculoskeletal: no edema.   Data Reviewed: Basic Metabolic Panel:  Recent Labs  Lab 08/24/Christian 0521 08/25/Christian 0620 08/27/Christian 0849 08/28/Christian 0510 08/29/Christian 0438  NA 136* 136* 141 137 137  K 3.7 4.4 4.2 4.4 4.4  CL 96 96 100 99 100  CO2 19 19 17* 20 19  GLUCOSE 158* 141* 126* 156* 149*  BUN 58* 78* 65* 82* 97*  CREATININE 6.04*  7.48* 6.08* 6.90* 7.63*  CALCIUM 7.8* 8.1* 8.5 8.4 8.2*  MG  --   --  2.5 2.5 2.3  PHOS  --   --  5.8* 5.4* 6.2*   Liver Function Tests:  Recent Labs Lab 08/24/Christian 0521 08/25/Christian 0620 08/27/Christian 0849 08/28/Christian 0510 08/29/Christian 0438  AST 21 22  --   --   --   ALT 14 12  --   --   --   ALKPHOS 60 61  --   --   --   BILITOT 0.5 0.5  --   --   --   PROT 5.4* 5.9*  --   --   --   ALBUMIN 2.2* 2.2* 2.4* 2.5* 2.3*    Recent Labs Lab 08/23/Christian 0350 08/24/Christian 0521 08/25/Christian 0620 08/27/Christian 0849 08/29/Christian 0438  LIPASE 542* 1420* 1481* 740* 827*  AMYLASE  --  847* 1050*  --   --    CBC:  Recent Labs Lab 08/24/Christian 0521 08/26/Christian 0605 08/27/Christian 0849  WBC 4.2 11.6* 7.9  HGB 9.5* 10.1* 9.4*  HCT 26.5* 29.5* 27.5*  MCV 95.7 92.8 97.2  PLT 93* 103* 100*   CBG:  Recent Labs Lab 08/28/Christian 0746 08/28/Christian 1147 08/28/Christian 1729 08/28/Christian 2101 08/29/Christian 0742  GLUCAP 158* 302* 164* 186* 150*    Recent Results (from the past 240 hour(s))  MRSA PCR SCREENING     Status: None   Collection Time    08/20/Christian  1:50 AM      Result Value Ref Range Status   MRSA by PCR NEGATIVE  NEGATIVE Final   Comment:            The GeneXpert MRSA Assay (FDA     approved for NASAL specimens     only), is one component of a     comprehensive MRSA colonization     surveillance program. It is not     intended to diagnose MRSA     infection nor to guide or     monitor treatment for     MRSA infections.  URINE CULTURE     Status: None   Collection Time    08/20/Christian  2:56 AM      Result Value Ref Range Status   Specimen Description URINE, CATHETERIZED   Final   Special Requests NONE   Final   Culture  Setup Time     Final   Value: 03/23/2014 09:02     Performed at [place] Count     Final   Value: NO GROWTH     Performed at [facility]   Culture     Final   Value: NO GROWTH     Performed at [facility]   Report Status 03/24/2014 FINAL   Final  CULTURE, BLOOD (ROUTINE  X 2)     Status: None   Collection Time    08/20/Christian  3:00 AM      Result Value Ref Range Status   Specimen Description BLOOD LEFT ARM   Final   Special Requests BOTTLES DRAWN AEROBIC AND ANAEROBIC 10CC   Final   Culture  Setup  Time     Final   Value: 03/23/2014 08:48     Performed at [facility]   Culture     Final   Value: NO GROWTH 5 DAYS     Performed at [facility]   Report Status 03/29/2014 FINAL   Final  CULTURE, BLOOD (ROUTINE X 2)     Status: None   Collection Time    08/20/Christian  3:06 AM      Result Value Ref Range Status   Specimen Description BLOOD RIGHT HAND   Final   Special Requests BOTTLES DRAWN AEROBIC AND ANAEROBIC 5CC   Final   Culture  Setup Time     Final   Value: 03/23/2014 08:47     Performed at [facility]   Culture     Final   Value: NO GROWTH 5 DAYS     Performed at [facility]   Report Status 03/29/2014 FINAL   Final  AFB CULTURE WITH SMEAR     Status: None   Collection Time    08/20/Christian 12:00 PM      Result Value Ref Range Status   Specimen Description STOOL   Final   Special Requests NONE   Final   Acid Fast Smear     Final   Value: NO ACID FAST BACILLI SEEN     Performed at [facility]   Culture     Final   Value: CULTURE WILL BE EXAMINED FOR 6 WEEKS BEFORE ISSUING A FINAL REPORT     Performed at [facility]   Report Status PENDING   Incomplete  CLOSTRIDIUM DIFFICILE BY PCR     Status: None   Collection Time    08/21/Christian  5:24 AM      Result Value Ref Range Status   C difficile by pcr NEGATIVE  NEGATIVE Final  CULTURE, EXPECTORATED SPUTUM-ASSESSMENT     Status: None   Collection Time    08/21/Christian  5:24 AM      Result Value Ref Range Status   Specimen Description SPUTUM   Final   Special Requests NONE   Final   Sputum evaluation     Final   Value: MICROSCOPIC FINDINGS SUGGEST THAT THIS SPECIMEN IS NOT REPRESENTATIVE OF LOWER RESPIRATORY SECRETIONS. PLEASE RECOLLECT.     RESULT CALLED TO,  READ BACK BY AND VERIFIED WITHWilson Singer RN [phone] GREEN R   Report Status 03/24/2014 FINAL   Final     Studies: No results found.  Scheduled Meds: . amLODipine  10 mg Oral QHS  . antiseptic oral rinse  7 mL Mouth Rinse q12n4p  . atenolol  50 mg Oral Daily  . chlorhexidine  Christian mL Mouth Rinse BID  . doxazosin  1 mg Oral QHS  . insulin aspart  0-Christian Units Subcutaneous TID WC  . insulin aspart  0-5 Units Subcutaneous QHS  . insulin aspart  3 Units Subcutaneous TID WC  . pantoprazole  40 mg Oral QHS  . sodium bicarbonate  650 mg Oral TID   Continuous Infusions: . sodium chloride Stopped (08/23/Christian 1330)    Active Problems:   AKI (acute kidney injury)   Metabolic acidosis   Hyperkalemia   Hyponatremia   HIV disease   Acute gastroenteritis   Protein-calorie malnutrition, severe   Acute kidney injury    Time spent: 30 minutes.     [place] [facility] Pager [phone].   If 7PM-7AM, please contact night-coverage  at [email or web address], password [facility] 04/01/2014, 10:23 AM  LOS: 10 days

## 2014-04-01 NOTE — Progress Notes (Signed)
Patient ID: [name], male   DOB: 11/01/1945, 68 y.o.   MRN: [id number]   [facility] Progress Note   Assessment/ Plan:   1. ARF:  Continues to have fair urine output but without any evident renal recovery-BUN and creatinine both continue to rise. No acute indications for hemodialysis today, continue to monitor and have tunneled dialysis catheter placed if dialysis needed again anticipating slow (if any) renal recovery. Patient informed of this. 2. Metabolic Acidosis: Improved with dialysis, continue to monitor (his first dialysis treatment was on 03/24/14) 3. Pancreatitis- clinically improving and tolerating current diet without abdominal pain  4. Hyponatremia- improved with dialysis, not on fluid restriction 5. HIV- per ID 6. HTN- blood pressure slightly elevated, on amlodipine and atenolol, start doxazosin 1 mg each bedtime  Subjective:   Reports to be feeling better-improved mobility and comfort after discontinuation of his femoral dialysis catheter . Somewhat distraught at the prospect of a long hospitalization/hemodialysis needs.    Objective:   BP 115/60  Pulse 64  Temp(Src) 97.5 F (36.4 C) (Oral)  Resp 20  Ht 5\' 3"  (1.6 m)  Wt 49.5 kg (109 lb 2 oz)  BMI 19.34 kg/m2  SpO2 100%  Intake/Output Summary (Last 24 hours) at 04/01/14 1120 Last data filed at 04/01/14 1058  Gross per 24 hour  Intake    480 ml  Output   1450 ml  Net   -970 ml   Weight change: -2 kg (-4 lb 6.6 oz)  Physical Exam: Gen: Comfortably resting in bed, watching television CVS: Pulse regular rate and rhythm, S1 and S2 normal Resp: Coarse breath sounds bilaterally, no rales/wheeze Abd: Soft, flat, mildly tender over the epigastric area Ext: No lower extremity edema  Imaging: No results found.  Labs: BMET  Recent Labs Lab 03/26/14 0350 03/27/14 0521 03/28/14 0620 03/30/14 0849 03/31/14 0510 04/01/14 0438  NA 139 136* 136* 141 137 137  K 3.6* 3.7 4.4 4.2 4.4 4.4  CL 96 96  96 100 99 100  CO2 18* 19 19 17* 20 19  GLUCOSE 120* 158* 141* 126* 156* 149*  BUN 35* 58* 78* 65* 82* 97*  CREATININE 4.26* 6.04* 7.48* 6.08* 6.90* 7.63*  CALCIUM 7.5* 7.8* 8.1* 8.5 8.4 8.2*  PHOS  --   --   --  5.8* 5.4* 6.2*   CBC  Recent Labs Lab 03/27/14 0521 03/29/14 0605 03/30/14 0849  WBC 4.2 11.6* 7.9  HGB 9.5* 10.1* 9.4*  HCT 26.5* 29.5* 27.5*  MCV 95.7 92.8 97.2  PLT 93* 103* 100*   Medications:    . amLODipine  10 mg Oral QHS  . antiseptic oral rinse  7 mL Mouth Rinse q12n4p  . atenolol  50 mg Oral Daily  . chlorhexidine  15 mL Mouth Rinse BID  . doxazosin  1 mg Oral QHS  . insulin aspart  0-15 Units Subcutaneous TID WC  . insulin aspart  0-5 Units Subcutaneous QHS  . insulin aspart  3 Units Subcutaneous TID WC  . pantoprazole  40 mg Oral QHS  . sodium bicarbonate  650 mg Oral TID   Elmarie Shiley, MD 04/01/2014, 11:20 AM

## 2014-04-02 ENCOUNTER — Encounter (HOSPITAL_COMMUNITY): Payer: Self-pay | Admitting: Radiology

## 2014-04-02 LAB — SURGICAL PCR SCREEN
MRSA, PCR: NEGATIVE
STAPHYLOCOCCUS AUREUS: NEGATIVE

## 2014-04-02 LAB — MAGNESIUM: Magnesium: 2.2 mg/dL (ref 1.5–2.5)

## 2014-04-02 LAB — RENAL FUNCTION PANEL
ALBUMIN: 2.4 g/dL — AB (ref 3.5–5.2)
Anion gap: 23 — ABNORMAL HIGH (ref 5–15)
BUN: 107 mg/dL — ABNORMAL HIGH (ref 6–23)
CALCIUM: 8.6 mg/dL (ref 8.4–10.5)
CHLORIDE: 97 meq/L (ref 96–112)
CO2: 16 meq/L — AB (ref 19–32)
Creatinine, Ser: 8.13 mg/dL — ABNORMAL HIGH (ref 0.50–1.35)
GFR, EST AFRICAN AMERICAN: 7 mL/min — AB (ref >90)
GFR, EST NON AFRICAN AMERICAN: 6 mL/min — AB (ref >90)
Glucose, Bld: 154 mg/dL — ABNORMAL HIGH (ref 70–99)
Phosphorus: 6.4 mg/dL — ABNORMAL HIGH (ref 2.3–4.6)
Potassium: 4.6 mEq/L (ref 3.7–5.3)
Sodium: 136 mEq/L — ABNORMAL LOW (ref 137–147)

## 2014-04-02 LAB — GLUCOSE, CAPILLARY
GLUCOSE-CAPILLARY: 237 mg/dL — AB (ref 70–99)
GLUCOSE-CAPILLARY: 234 mg/dL — AB (ref 70–99)
Glucose-Capillary: 153 mg/dL — ABNORMAL HIGH (ref 70–99)
Glucose-Capillary: 129 mg/dL — ABNORMAL HIGH (ref 70–99)

## 2014-04-02 MED ORDER — CEFAZOLIN SODIUM-DEXTROSE 2-3 GM-% IV SOLR
2.0000 g | Freq: Once | INTRAVENOUS | Status: AC
  Administered 2014-04-03: 2 g via INTRAVENOUS
  Filled 2014-04-02: qty 50

## 2014-04-02 NOTE — H&P (Signed)
Referring Physician: Dr. Posey Pronto HPI: [name] is an 68 y.o. male with acute renal failure who has underwent dialysis with femoral catheter without improvement in renal function. IR received request for tunneled HD catheter placement. He denies any chest pain, shortness of breath or palpitations. He denies any active signs of bleeding or excessive bruising. He denies any recent fever or chills. The patient denies any history of sleep apnea or chronic oxygen use. He has previously tolerated sedation without complications.   Past Medical History:  Past Medical History  Diagnosis Date  . Arthritis   . Diabetes mellitus   . History of depression   . GERD (gastroesophageal reflux disease)   . CAD (coronary artery disease)     2003- cabg  . Hypertension   . Hyperlipidemia   . History of kidney stones   . COPD (chronic obstructive pulmonary disease)   . HIV (human immunodeficiency virus infection) 1991    on meds since initial dx.   . Dermatitis 11/27/2012  . Nocturia 03/06/2013  . Epicondylitis 06/05/2013    right  . Pancreatitis 11/2013    attributed to HIV meds.     Past Surgical History:  Past Surgical History  Procedure Laterality Date  . Coronary artery bypass graft  05/2002  . Vasectomy      Family History:  Family History  Problem Relation Age of Onset  . Arthritis      mother/father/paternal grandparents  . Breast cancer Maternal Aunt     paternal aunt  . Lung cancer Maternal Aunt   . Hyperlipidemia Mother   . Hyperlipidemia Father   . Heart disease      parents/maternal grandparents/ 2 brothers  . Stroke Paternal Grandmother   . Hypertension Father     paternal grandmother/3 brothers/1 sister  . Mental retardation Sister   . Diabetes Mother     paternal grandparents/1 brother    Social History:  reports that he has never smoked. He has never used smokeless tobacco. He reports that he does not drink alcohol or use illicit drugs.  Allergies: No Known  Allergies  Medications:   Medication List    ASK your doctor about these medications       amLODipine 2.5 MG tablet  Commonly known as:  NORVASC  Take 2.5 mg by mouth daily.     aspirin 81 MG chewable tablet  Chew 81 mg by mouth daily.     atenolol 50 MG tablet  Commonly known as:  TENORMIN  Take 50 mg by mouth daily.     atorvastatin 10 MG tablet  Commonly known as:  LIPITOR  Take 10 mg by mouth daily.     b complex vitamins tablet  Take 1 tablet by mouth daily.     fenofibrate 160 MG tablet  Take 160 mg by mouth daily.     FIBER SUPREME PO  Take 10 mLs by mouth daily.     freestyle lancets  USE TO CHECK BLOOD SUGAR EVERY DAY     GLUCERNA Liqd  Take 1 Can by mouth 2 (two) times daily between meals.     HYDROcodone-acetaminophen 5-325 MG per tablet  Commonly known as:  NORCO/VICODIN  Take 1-2 tablets by mouth every 6 (six) hours as needed for moderate pain. Take one tablet when pain starts then may take another one if pain continues     KRILL OIL OMEGA-3 PO  Take 1 capsule by mouth daily.     lisinopril-hydrochlorothiazide 20-12.5 MG per  tablet  Commonly known as:  PRINZIDE,ZESTORETIC  Take 1 tablet by mouth 2 (two) times daily. Take twice daily per patient     metFORMIN 1000 MG tablet  Commonly known as:  GLUCOPHAGE  Take 1,000 mg by mouth 2 (two) times daily with a meal.     omeprazole 20 MG capsule  Commonly known as:  PRILOSEC  Take 20 mg by mouth daily.     ondansetron 8 MG disintegrating tablet  Commonly known as:  ZOFRAN-ODT  Take 8 mg by mouth every 8 (eight) hours as needed for nausea or vomiting.     Pancrelipase (Lip-Prot-Amyl) 24000 UNITS Cpep  Take 1 capsule by mouth 3 (three) times daily.     PROBIOTIC DAILY PO  Take 1 tablet by mouth daily.     STRIBILD 150-150-200-300 MG Tabs tablet  Generic drug:  elvitegravir-cobicistat-emtricitabine-tenofovir  Take 1 tablet by mouth daily with breakfast.     valACYclovir 500 MG tablet   Commonly known as:  VALTREX  Take 500 mg by mouth 2 (two) times daily.       Please HPI for pertinent positives, otherwise complete 10 system ROS negative.  Physical Exam: BP 111/62  Pulse 62  Temp(Src) 97.9 F (36.6 C) (Oral)  Resp 18  Ht '5\' 3"'  (1.6 m)  Wt 109 lb 4.8 oz (49.578 kg)  BMI 19.37 kg/m2  SpO2 100% Body mass index is 19.37 kg/(m^2).  General Appearance:  Alert, cooperative, no distress  Head:  Normocephalic, without obvious abnormality, atraumatic  Neck: Supple, symmetrical, trachea midline  Lungs:   Clear to auscultation bilaterally.  Chest Wall:  No tenderness or deformity  Heart:  Regular rate and rhythm, S1, S2 normal, no murmur, rub or gallop.  Abdomen:   Soft, non-tender, non distended, (+) BS  Extremities: Extremities normal, atraumatic, no cyanosis or edema  Neurologic: Normal affect, no gross deficits.   Results for orders placed during the hospital encounter of 03/22/14 (from the past 48 hour(s))  GLUCOSE, CAPILLARY     Status: Abnormal   Collection Time    03/31/14 11:47 AM      Result Value Ref Range   Glucose-Capillary 302 (*) 70 - 99 mg/dL  GLUCOSE, CAPILLARY     Status: Abnormal   Collection Time    03/31/14  5:29 PM      Result Value Ref Range   Glucose-Capillary 164 (*) 70 - 99 mg/dL  GLUCOSE, CAPILLARY     Status: Abnormal   Collection Time    03/31/14  9:01 PM      Result Value Ref Range   Glucose-Capillary 186 (*) 70 - 99 mg/dL  MAGNESIUM     Status: None   Collection Time    04/01/14  4:38 AM      Result Value Ref Range   Magnesium 2.3  1.5 - 2.5 mg/dL  LIPASE, BLOOD     Status: Abnormal   Collection Time    04/01/14  4:38 AM      Result Value Ref Range   Lipase 827 (*) 11 - 59 U/L  RENAL FUNCTION PANEL     Status: Abnormal   Collection Time    04/01/14  4:38 AM      Result Value Ref Range   Sodium 137  137 - 147 mEq/L   Potassium 4.4  3.7 - 5.3 mEq/L   Chloride 100  96 - 112 mEq/L   CO2 19  19 - 32 mEq/L   Glucose, Bld  149 (*)  70 - 99 mg/dL   BUN 97 (*) 6 - 23 mg/dL   Creatinine, Ser 7.63 (*) 0.50 - 1.35 mg/dL   Calcium 8.2 (*) 8.4 - 10.5 mg/dL   Phosphorus 6.2 (*) 2.3 - 4.6 mg/dL   Albumin 2.3 (*) 3.5 - 5.2 g/dL   GFR calc non Af Amer 7 (*) >90 mL/min   GFR calc Af Amer 8 (*) >90 mL/min   Comment: (NOTE)     The eGFR has been calculated using the CKD EPI equation.     This calculation has not been validated in all clinical situations.     eGFR's persistently <90 mL/min signify possible Chronic Kidney     Disease.   Anion gap 18 (*) 5 - 15  GLUCOSE, CAPILLARY     Status: Abnormal   Collection Time    04/01/14  7:42 AM      Result Value Ref Range   Glucose-Capillary 150 (*) 70 - 99 mg/dL  GLUCOSE, CAPILLARY     Status: Abnormal   Collection Time    04/01/14 11:36 AM      Result Value Ref Range   Glucose-Capillary 273 (*) 70 - 99 mg/dL  GLUCOSE, CAPILLARY     Status: Abnormal   Collection Time    04/01/14  5:16 PM      Result Value Ref Range   Glucose-Capillary 107 (*) 70 - 99 mg/dL  GLUCOSE, CAPILLARY     Status: Abnormal   Collection Time    04/01/14  9:08 PM      Result Value Ref Range   Glucose-Capillary 153 (*) 70 - 99 mg/dL  RENAL FUNCTION PANEL     Status: Abnormal   Collection Time    04/02/14  5:21 AM      Result Value Ref Range   Sodium 136 (*) 137 - 147 mEq/L   Potassium 4.6  3.7 - 5.3 mEq/L   Chloride 97  96 - 112 mEq/L   CO2 16 (*) 19 - 32 mEq/L   Glucose, Bld 154 (*) 70 - 99 mg/dL   BUN 107 (*) 6 - 23 mg/dL   Creatinine, Ser 8.13 (*) 0.50 - 1.35 mg/dL   Calcium 8.6  8.4 - 10.5 mg/dL   Phosphorus 6.4 (*) 2.3 - 4.6 mg/dL   Albumin 2.4 (*) 3.5 - 5.2 g/dL   GFR calc non Af Amer 6 (*) >90 mL/min   GFR calc Af Amer 7 (*) >90 mL/min   Comment: (NOTE)     The eGFR has been calculated using the CKD EPI equation.     This calculation has not been validated in all clinical situations.     eGFR's persistently <90 mL/min signify possible Chronic Kidney     Disease.   Anion gap  23 (*) 5 - 15  MAGNESIUM     Status: None   Collection Time    04/02/14  5:21 AM      Result Value Ref Range   Magnesium 2.2  1.5 - 2.5 mg/dL  GLUCOSE, CAPILLARY     Status: Abnormal   Collection Time    04/02/14  7:29 AM      Result Value Ref Range   Glucose-Capillary 129 (*) 70 - 99 mg/dL   No results found.  Assessment/Plan Acute renal failure s/p HD without improvement previous femoral catheter removed 8/28. Request for tunneled HD catheter with sedation. Patient will be NPO after midnight, blood thinners held, ancef ordered, afebrile and wbc wnl. Recheck labs  and temp in am. Risks and Benefits discussed with the patient. All of the patient's questions were answered, patient is agreeable to proceed. Consent signed and in chart. Metabolic acidosis, improving Pancreatitis, improving HIV, per ID HTN   Tsosie Billing D PA-C 04/02/2014, 11:37 AM

## 2014-04-02 NOTE — Progress Notes (Signed)
[facility] PROGRESS NOTE  [name] OFH:[id number] DOB: 08/28/1945 DOA: 03/22/2014 PCP: Penni Homans, MD  Brief HPI: 68 year old male with HIV on STRIBILD presented to [place] c/o emesis, diarrhea, and generalized weakness x 3 days. In ED found to be in renal failure with elevated lipase. Transferred to [facility] for ICU admission. Patient subsequently transfer to [facility]. Patient admitted with respiratory failure, renal failure, and acute pancreatitis. He required intermittent dialysis. His respiratory failure has improved after volume removed with dialysis. Pancreatitis is improved. Renal function is getting worse.   Assessment/Plan: Acute renal failure, oliguric , High AG metabolic acidosis (suspect lactic, uremic) Creatinine continues to rise. Thought to be secondary to volume depletion, diuretics, SIRS. Had dialysis 8/21, 8/22, 8/25. Nephrology following closely. Continue Sod bicarb. Further management per Nephrology. Femoral dialysis catheter was removed 8/28. He may need new access if renal failure doesn't improve.  Acute pancreatitis He is much improved. This was possibly due to STRIBILD (hx of this with other ARV meds). Abd [place] > no obvious pancreatic abnormality, similar dilated CBD compared with 4/15. HIV medications can cause elevation of amylase and lipase. MRI, MRCP as below. GI has signed off.  Acute on chronic respiratory failure  Uncompensated met acidosis pulm edema? Vs pancreatitis induced ALI -Repeated chest x ray: 8-24: Persistent asymmetric airspace process, likely bilateral infiltrates. No need for antibiotics per ID.  -Had fluids removed with dialysis Resolved.  DM2 uncontrolled CBG's increasing. Was on Metformin at home which cannot be restarted due to renal failure. Improved some with meal coverage. May need to utilize Lantus. A1c was 7.5 in July. Monitor for now.  Chronic Diarrhea Ongoing for last 6 months. C diff was negative.    Thrombocytopenia Improved.   HIV Holding HIV medications as they could have caused pancreatitis. ID following and will resume meds after discharge.   COPD without evidence of exacerbation.  Stable  Hypertension BP better. Will not be too aggressive as he may need dialysis. Increased Amlodipine dose 8/26. Continue atenolol.   Acute encephalopathy in setting of acidosis Resolved  Hyperkalemia  Resolved  DVT Prophylaxis: SCD's Code Status: Full Code.  Family Communication: Discussed with patient.  Disposition Plan: Not ready for discharge. May need dialysis. Mobilize.   Consultants:  Nephrology  ID  GI.   Procedures: 8/20 [place] abdo>>>Visualized pancreas has a normal sonographic appearance. No biliary sludge or gallstones. Upper limits of normal to mildly dilated CBD, the similar to the abdomen ultrasound in April. No intrahepatic biliary ductal dilatation identified   8/21 TTE > Mild LVH, EF 60%, PAP 55mHg  Antibiotics:  none  HPI/Subjective: He continues to feel well. Denies complaints. Ambulating.  Objective: Filed Vitals:   04/02/14 0501  BP: 128/64  Pulse: 62  Temp: 99 F (37.2 C)  Resp: 17    Intake/Output Summary (Last 24 hours) at 04/02/14 0945 Last data filed at 04/02/14 0215  Gross per 24 hour  Intake    840 ml  Output    650 ml  Net    190 ml   Filed Weights   03/30/14 2135 03/31/14 2038 04/01/14 2036  Weight: 51.5 kg (113 lb 8.6 oz) 49.5 kg (109 lb 2 oz) 49.578 kg (109 lb 4.8 oz)    Exam:   General:  Alert in no distress.   Cardiovascular: S 1, S 2 RRR  Respiratory: CTA bilaterally. No crackles  Abdomen: BS present, soft, NT  Musculoskeletal: no edema.   Data Reviewed: Basic Metabolic Panel:  Recent Labs Lab 03/28/14 0620 03/30/14 0849 03/31/14 0510 04/01/14 0438 04/02/14 0521  NA 136* 141 137 137 136*  K 4.4 4.2 4.4 4.4 4.6  CL 96 100 99 100 97  CO2 19 17* 20 19 16*  GLUCOSE 141* 126* 156* 149* 154*  BUN 78* 65*  82* 97* 107*  CREATININE 7.48* 6.08* 6.90* 7.63* 8.13*  CALCIUM 8.1* 8.5 8.4 8.2* 8.6  MG  --  2.5 2.5 2.3 2.2  PHOS  --  5.8* 5.4* 6.2* 6.4*   Liver Function Tests:  Recent Labs Lab 03/27/14 0521 03/28/14 0620 03/30/14 0849 03/31/14 0510 04/01/14 0438 04/02/14 0521  AST 21 22  --   --   --   --   ALT 14 12  --   --   --   --   ALKPHOS 60 61  --   --   --   --   BILITOT 0.5 0.5  --   --   --   --   PROT 5.4* 5.9*  --   --   --   --   ALBUMIN 2.2* 2.2* 2.4* 2.5* 2.3* 2.4*    Recent Labs Lab 03/27/14 0521 03/28/14 0620 03/30/14 0849 04/01/14 0438  LIPASE 1420* 1481* 740* 827*  AMYLASE 847* 1050*  --   --    CBC:  Recent Labs Lab 03/27/14 0521 03/29/14 0605 03/30/14 0849  WBC 4.2 11.6* 7.9  HGB 9.5* 10.1* 9.4*  HCT 26.5* 29.5* 27.5*  MCV 95.7 92.8 97.2  PLT 93* 103* 100*   CBG:  Recent Labs Lab 04/01/14 0742 04/01/14 1136 04/01/14 1716 04/01/14 2108 04/02/14 0729  GLUCAP 150* 273* 107* 153* 129*    Recent Results (from the past 240 hour(s))  AFB CULTURE WITH SMEAR     Status: None   Collection Time    03/23/14 12:00 PM      Result Value Ref Range Status   Specimen Description STOOL   Final   Special Requests NONE   Final   Acid Fast Smear     Final   Value: NO ACID FAST BACILLI SEEN     Performed at [facility]   Culture     Final   Value: CULTURE WILL BE EXAMINED FOR 6 WEEKS BEFORE ISSUING A FINAL REPORT     Performed at [facility]   Report Status PENDING   Incomplete  CLOSTRIDIUM DIFFICILE BY PCR     Status: None   Collection Time    03/24/14  5:24 AM      Result Value Ref Range Status   C difficile by pcr NEGATIVE  NEGATIVE Final  CULTURE, EXPECTORATED SPUTUM-ASSESSMENT     Status: None   Collection Time    03/24/14  5:24 AM      Result Value Ref Range Status   Specimen Description SPUTUM   Final   Special Requests NONE   Final   Sputum evaluation     Final   Value: MICROSCOPIC FINDINGS SUGGEST THAT THIS SPECIMEN  IS NOT REPRESENTATIVE OF LOWER RESPIRATORY SECRETIONS. PLEASE RECOLLECT.     RESULT CALLED TO, READ BACK BY AND VERIFIED WITHWilson Singer RN [phone] GREEN R   Report Status 03/24/2014 FINAL   Final     Studies: No results found.  Scheduled Meds: . amLODipine  10 mg Oral QHS  . antiseptic oral rinse  7 mL Mouth Rinse q12n4p  . atenolol  50 mg Oral Daily  . chlorhexidine  15 mL Mouth Rinse BID  . doxazosin  1 mg Oral QHS  . insulin aspart  0-15 Units Subcutaneous TID WC  . insulin aspart  0-5 Units Subcutaneous QHS  . insulin aspart  3 Units Subcutaneous TID WC  . pantoprazole  40 mg Oral QHS  . sodium bicarbonate  650 mg Oral TID   Continuous Infusions: . sodium chloride Stopped (03/26/14 1330)    Active Problems:   AKI (acute kidney injury)   Metabolic acidosis   Hyperkalemia   Hyponatremia   HIV disease   Acute gastroenteritis   Protein-calorie malnutrition, severe   Acute kidney injury    Time spent: 30 minutes.     [facility] Pager [phone].   If 7PM-7AM, please contact night-coverage at [email or web address], password [facility] 04/02/2014, 9:45 AM  LOS: 11 days

## 2014-04-02 NOTE — Progress Notes (Signed)
Patient ID: [name], male   DOB: 11-Nov-1945, 68 y.o.   MRN: [id number]  [facility] Progress Note   Assessment/ Plan:   1. ARF:  Continues to have marginal urine output but without any evident fucntional renal recovery-BUN and creatinine both continue to rise. No acute indications for hemodialysis today. Will consult with IR to have a tunneled hemodialysis catheter placed tomorrow anticipating prolonged HD needs  (his first dialysis treatment was on 03/24/14---today day #10). He may unfortunately need to remain here for at least 28 days to be declared ESRD 2. Metabolic Acidosis: Improved transiently with dialysis, continue to monitor 3. Pancreatitis- clinically improving and tolerating current diet without abdominal pain  4. Hyponatremia- improved with dialysis, not on fluid restriction 5. HIV- per ID 6. HTN- blood pressure improved and currently well controlled on atenolol/amlodipine and doxazosin  Subjective:   Reports to be feeling better-improved mobility and strength- no further abdominal pain. Somewhat distraught at the prospect of a long hospitalization/hemodialysis needs.    Objective:   BP 111/62  Pulse 62  Temp(Src) 97.9 F (36.6 C) (Oral)  Resp 18  Ht 5\' 3"  (1.6 m)  Wt 49.578 kg (109 lb 4.8 oz)  BMI 19.37 kg/m2  SpO2 100%  Intake/Output Summary (Last 24 hours) at 04/02/14 1101 Last data filed at 04/02/14 0215  Gross per 24 hour  Intake    600 ml  Output    475 ml  Net    125 ml   Weight change: 0.078 kg (2.8 oz)  Physical Exam: Gen: Ambulating in his room without problems CVS: Pulse regular rate and rhythm, S1 and S2 normal Resp: Coarse breath sounds bilaterally, no rales/wheeze Abd: Soft, flat, mildly tender over the epigastric area Ext: No lower extremity edema  Imaging: No results found.  Labs: BMET  Recent Labs Lab 03/27/14 0521 03/28/14 0620 03/30/14 0849 03/31/14 0510 04/01/14 0438 04/02/14 0521  NA 136* 136* 141 137 137 136*   K 3.7 4.4 4.2 4.4 4.4 4.6  CL 96 96 100 99 100 97  CO2 19 19 17* 20 19 16*  GLUCOSE 158* 141* 126* 156* 149* 154*  BUN 58* 78* 65* 82* 97* 107*  CREATININE 6.04* 7.48* 6.08* 6.90* 7.63* 8.13*  CALCIUM 7.8* 8.1* 8.5 8.4 8.2* 8.6  PHOS  --   --  5.8* 5.4* 6.2* 6.4*   CBC  Recent Labs Lab 03/27/14 0521 03/29/14 0605 03/30/14 0849  WBC 4.2 11.6* 7.9  HGB 9.5* 10.1* 9.4*  HCT 26.5* 29.5* 27.5*  MCV 95.7 92.8 97.2  PLT 93* 103* 100*   Medications:    . amLODipine  10 mg Oral QHS  . antiseptic oral rinse  7 mL Mouth Rinse q12n4p  . atenolol  50 mg Oral Daily  . chlorhexidine  15 mL Mouth Rinse BID  . doxazosin  1 mg Oral QHS  . insulin aspart  0-15 Units Subcutaneous TID WC  . insulin aspart  0-5 Units Subcutaneous QHS  . insulin aspart  3 Units Subcutaneous TID WC  . pantoprazole  40 mg Oral QHS  . sodium bicarbonate  650 mg Oral TID   Elmarie Shiley, MD 04/02/2014, 11:01 AM

## 2014-04-03 ENCOUNTER — Inpatient Hospital Stay (HOSPITAL_COMMUNITY): Payer: Medicare Other

## 2014-04-03 LAB — CBC
HEMATOCRIT: 24.4 % — AB (ref 39.0–52.0)
Hemoglobin: 8.6 g/dL — ABNORMAL LOW (ref 13.0–17.0)
MCH: 33.5 pg (ref 26.0–34.0)
MCHC: 35.2 g/dL (ref 30.0–36.0)
MCV: 94.9 fL (ref 78.0–100.0)
PLATELETS: 136 10*3/uL — AB (ref 150–400)
RBC: 2.57 MIL/uL — ABNORMAL LOW (ref 4.22–5.81)
RDW: 12.9 % (ref 11.5–15.5)
WBC: 5.7 10*3/uL (ref 4.0–10.5)

## 2014-04-03 LAB — BASIC METABOLIC PANEL
ANION GAP: 21 — AB (ref 5–15)
BUN: 122 mg/dL — ABNORMAL HIGH (ref 6–23)
CO2: 17 mEq/L — ABNORMAL LOW (ref 19–32)
Calcium: 9.1 mg/dL (ref 8.4–10.5)
Chloride: 99 mEq/L (ref 96–112)
Creatinine, Ser: 9.06 mg/dL — ABNORMAL HIGH (ref 0.50–1.35)
GFR, EST AFRICAN AMERICAN: 6 mL/min — AB (ref >90)
GFR, EST NON AFRICAN AMERICAN: 5 mL/min — AB (ref >90)
Glucose, Bld: 144 mg/dL — ABNORMAL HIGH (ref 70–99)
POTASSIUM: 5.1 meq/L (ref 3.7–5.3)
SODIUM: 137 meq/L (ref 137–147)

## 2014-04-03 LAB — PROTIME-INR
INR: 1.32 (ref 0.00–1.49)
Prothrombin Time: 16.4 seconds — ABNORMAL HIGH (ref 11.6–15.2)

## 2014-04-03 LAB — GLUCOSE, CAPILLARY
Glucose-Capillary: 186 mg/dL — ABNORMAL HIGH (ref 70–99)
Glucose-Capillary: 131 mg/dL — ABNORMAL HIGH (ref 70–99)
Glucose-Capillary: 127 mg/dL — ABNORMAL HIGH (ref 70–99)
Glucose-Capillary: 126 mg/dL — ABNORMAL HIGH (ref 70–99)

## 2014-04-03 MED ORDER — LIDOCAINE HCL (PF) 1 % IJ SOLN
INTRAMUSCULAR | Status: AC
  Filled 2014-04-03: qty 30

## 2014-04-03 MED ORDER — CEFAZOLIN (ANCEF) 1 G IV SOLR: INTRAVENOUS | Status: AC | PRN

## 2014-04-03 MED ORDER — MIDAZOLAM HCL 2 MG/2ML IJ SOLN
INTRAMUSCULAR | Status: AC
  Filled 2014-04-03: qty 2

## 2014-04-03 MED ORDER — MIDAZOLAM HCL 2 MG/2ML IJ SOLN
INTRAMUSCULAR | Status: AC | PRN
  Administered 2014-04-03: 0.5 mg via INTRAVENOUS

## 2014-04-03 MED ORDER — HEPARIN SODIUM (PORCINE) 1000 UNIT/ML IJ SOLN
INTRAMUSCULAR | Status: AC
  Filled 2014-04-03: qty 1

## 2014-04-03 MED ORDER — FENTANYL CITRATE 0.05 MG/ML IJ SOLN
INTRAMUSCULAR | Status: AC
  Filled 2014-04-03: qty 2

## 2014-04-03 MED ORDER — SODIUM CHLORIDE 0.9 % IV SOLN
INTRAVENOUS | Status: AC | PRN
  Administered 2014-04-03: 10 mL/h via INTRAVENOUS

## 2014-04-03 MED ORDER — CEFAZOLIN SODIUM-DEXTROSE 2-3 GM-% IV SOLR
INTRAVENOUS | Status: AC
  Filled 2014-04-03: qty 50

## 2014-04-03 MED ORDER — FENTANYL CITRATE 0.05 MG/ML IJ SOLN
INTRAMUSCULAR | Status: AC | PRN
  Administered 2014-04-03: 25 ug via INTRAVENOUS

## 2014-04-03 NOTE — Progress Notes (Signed)
[facility]ious [facility]  Date of Admission:  03/22/2014  Antibiotics: none  Subjective: Patient in IR  Objective: Temp:  [97.7 F (36.5 C)-98.6 F (37 C)] 97.7 F (36.5 C) (08/31 1425) Pulse Rate:  [52-64] 52 (08/31 1530) Resp:  [9-20] 16 (08/31 1530) BP: (110-155)/(59-74) 117/65 mmHg (08/31 1530) SpO2:  [97 %-100 %] 100 % (08/31 1425) Weight:  [106 lb 7.7 oz (48.3 kg)-108 lb 12.8 oz (49.351 kg)] 106 lb 7.7 oz (48.3 kg) (08/31 1425)   Lab Results Lab Results  Component Value Date   WBC 5.7 04/03/2014   HGB 8.6* 04/03/2014   HCT 24.4* 04/03/2014   MCV 94.9 04/03/2014   PLT 136* 04/03/2014    Lab Results  Component Value Date   CREATININE 9.06* 04/03/2014   BUN 122* 04/03/2014   NA 137 04/03/2014   K 5.1 04/03/2014   CL 99 04/03/2014   CO2 17* 04/03/2014    Lab Results  Component Value Date   ALT 12 03/28/2014   AST 22 03/28/2014   ALKPHOS 61 03/28/2014   BILITOT 0.5 03/28/2014      Microbiology: Recent Results (from the past 240 hour(s))  SURGICAL PCR SCREEN     Status: None   Collection Time    04/02/14  9:30 PM      Result Value Ref Range Status   MRSA, PCR NEGATIVE  NEGATIVE Final   Staphylococcus aureus NEGATIVE  NEGATIVE Final   Comment:            The Xpert SA Assay (FDA     approved for NASAL specimens     in patients over 43 years of age),     is one component of     a comprehensive surveillance     program.  Test performance has     been validated by [employer] for patients greater     than or equal to 57 year old.     It is not intended     to diagnose infection nor to     guide or monitor treatment.    Studies/Results: Ir Fluoro Guide Cv Line Right  04/03/2014   CLINICAL DATA:  Renal failure.  EXAM: TUNNELED CENTRAL VENOUS HEMODIALYSIS CATHETER PLACEMENT WITH ULTRASOUND AND FLUOROSCOPIC GUIDANCE  ANESTHESIA/SEDATION: 0.5 mg IV Versed; 25 mcg IV Fentanyl.  Total Moderate Sedation Time  45 minutes.  MEDICATIONS: 2 g IV Ancef. As  antibiotic prophylaxis, Ancef was ordered pre-procedure and administered intravenously within one hour of incision.  FLUOROSCOPY TIME:  1 min and 36 seconds.  PROCEDURE: The procedure, risks, benefits, and alternatives were explained to the patient. Questions regarding the procedure were encouraged and answered. The patient understands and consents to the procedure.  The right neck and chest were prepped with chlorhexidine in a sterile fashion, and a sterile drape was applied covering the operative field. Maximum barrier sterile technique with sterile gowns and gloves were used for the procedure. Local anesthesia was provided with 1% lidocaine.  Ultrasound was used to confirm patency of the right internal jugular vein. After creating a small venotomy incision, a 21 gauge needle was advanced into the right internal jugular vein under direct, real-time ultrasound guidance. Ultrasound image documentation was performed. After securing guidewire access, an 8 Fr dilator was placed. A J-wire was kinked to measure appropriate catheter length.  A Bard Hemosplit tunneled hemodialysis catheter measuring 23 cm from tip to cuff was chosen for placement. This was tunneled  in a retrograde fashion from the chest wall to the venotomy incision.  At the venotomy, serial dilatation was performed and a 16 Fr peel-away sheath was placed over a guidewire. The catheter was then placed through the sheath and the sheath removed. Final catheter positioning was confirmed and documented with a fluoroscopic spot image. The catheter was aspirated, flushed with saline, and injected with appropriate volume heparin dwells.  The venotomy incision was closed with subcutaneous 3-0 Monocryl and subcuticular 4-0 Vicryl. Dermabond was applied to the incision. The catheter exit site was secured with 0-Prolene retention sutures.  COMPLICATIONS: None.  No pneumothorax.  FINDINGS: After catheter placement, the tips lie in the right atrium. The catheter  aspirates normally and is ready for immediate use.  IMPRESSION: Placement of tunneled hemodialysis catheter via the right internal jugular vein. The catheter tips lie in the right atrium. The catheter is ready for immediate use.   Electronically Signed   By: Aletta Edouard M.D.   On: 04/03/2014 14:14   Ir US Guide Vasc Access Right  04/03/2014   CLINICAL DATA:  Renal failure.  EXAM: TUNNELED CENTRAL VENOUS HEMODIALYSIS CATHETER PLACEMENT WITH ULTRASOUND AND FLUOROSCOPIC GUIDANCE  ANESTHESIA/SEDATION: 0.5 mg IV Versed; 25 mcg IV Fentanyl.  Total Moderate Sedation Time  45 minutes.  MEDICATIONS: 2 g IV Ancef. As antibiotic prophylaxis, Ancef was ordered pre-procedure and administered intravenously within one hour of incision.  FLUOROSCOPY TIME:  1 min and 36 seconds.  PROCEDURE: The procedure, risks, benefits, and alternatives were explained to the patient. Questions regarding the procedure were encouraged and answered. The patient understands and consents to the procedure.  The right neck and chest were prepped with chlorhexidine in a sterile fashion, and a sterile drape was applied covering the operative field. Maximum barrier sterile technique with sterile gowns and gloves were used for the procedure. Local anesthesia was provided with 1% lidocaine.  Ultrasound was used to confirm patency of the right internal jugular vein. After creating a small venotomy incision, a 21 gauge needle was advanced into the right internal jugular vein under direct, real-time ultrasound guidance. Ultrasound image documentation was performed. After securing guidewire access, an 8 Fr dilator was placed. A J-wire was kinked to measure appropriate catheter length.  A Bard Hemosplit tunneled hemodialysis catheter measuring 23 cm from tip to cuff was chosen for placement. This was tunneled in a retrograde fashion from the chest wall to the venotomy incision.  At the venotomy, serial dilatation was performed and a 16 Fr peel-away sheath  was placed over a guidewire. The catheter was then placed through the sheath and the sheath removed. Final catheter positioning was confirmed and documented with a fluoroscopic spot image. The catheter was aspirated, flushed with saline, and injected with appropriate volume heparin dwells.  The venotomy incision was closed with subcutaneous 3-0 Monocryl and subcuticular 4-0 Vicryl. Dermabond was applied to the incision. The catheter exit site was secured with 0-Prolene retention sutures.  COMPLICATIONS: None.  No pneumothorax.  FINDINGS: After catheter placement, the tips lie in the right atrium. The catheter aspirates normally and is ready for immediate use.  IMPRESSION: Placement of tunneled hemodialysis catheter via the right internal jugular vein. The catheter tips lie in the right atrium. The catheter is ready for immediate use.   Electronically Signed   By: Aletta Edouard M.D.   On: 04/03/2014 14:14    Assessment/Plan: 1)  Acute pancreatitis - MRCP without obvious etiology, noted pancreatitis.  Possible that it is NRTIs but would  be rare.  Will consider NRTI-sparring regimen.  Unfortunately he remains on dialysis.   Dr. Johnnye Sima here tomorrow    COMER, Herbie [place], [place] [place] [email or web address] [phone] pager   [phone] cell 04/03/2014, 4:05 PM

## 2014-04-03 NOTE — H&P (Signed)
Agree 

## 2014-04-03 NOTE — Procedures (Signed)
Procedure:  Tunneled HD catheter placement Findings:  Access of right IJ vein.  19 cm tip to cuff length Bard Hemosplit cath placed.  Tips in RA.  No PTX.  OK to use.

## 2014-04-03 NOTE — Progress Notes (Signed)
[facility] PROGRESS NOTE  VIOLET CART OAC:[id number] DOB: 19-Feb-1946 DOA: 03/22/2014 PCP: Penni Homans, MD  Brief HPI: 68 year old male with HIV on STRIBILD presented to [facility] c/o emesis, diarrhea, and generalized weakness x 3 days. In ED found to be in renal failure with elevated lipase. Transferred to [facility] for ICU admission. Patient subsequently transfer to [facility]. Patient admitted with respiratory failure, renal failure, and acute pancreatitis. He required intermittent dialysis. His respiratory failure has improved after volume removed with dialysis. Pancreatitis is improved. Renal function is getting worse.   Assessment/Plan: Acute renal failure, oliguric , High AG metabolic acidosis (suspect lactic, uremic) Creatinine continues to rise. Thought to be secondary to volume depletion, diuretics, SIRS. Had dialysis 8/21, 8/22, 8/25. Nephrology following closely. Continue Sod bicarb. Further management per Nephrology. Femoral dialysis catheter was removed 8/28. Plan is for HD catheter to be placed today by IR.   Acute pancreatitis He is much improved. This was possibly due to STRIBILD (hx of this with other ARV meds). Abd [place] > no obvious pancreatic abnormality, similar dilated CBD compared with 4/15. HIV medications can cause elevation of amylase and lipase. MRI, MRCP as below. GI has signed off.  Acute on chronic respiratory failure  Resolved. Likely due to uncompensated met acidosis pulm edema? Vs pancreatitis induced ALI. Repeated chest x ray: 8-24: Persistent asymmetric airspace process, likely bilateral infiltrates. No need for antibiotics per ID. Had fluids removed with dialysis.  DM2 uncontrolled CBG's increasing. Was on Metformin at home which cannot be restarted due to renal failure. Improved some with meal coverage. May need to utilize Lantus depending on CBG's. A1c was 7.5 in July. Monitor for now.  Chronic Diarrhea Ongoing for last 6 months. C diff was negative.    Thrombocytopenia Improved.   HIV Holding HIV medications as they could have caused pancreatitis. ID following and will resume meds after discharge.   COPD without evidence of exacerbation.  Stable  Hypertension BP better. Will not be too aggressive as he may need dialysis. Increased Amlodipine dose 8/26. Continue atenolol.   Acute encephalopathy in setting of acidosis Resolved  Hyperkalemia  Resolved  DVT Prophylaxis: SCD's Code Status: Full Code.  Family Communication: Discussed with patient.  Disposition Plan: Plan is to resume regular dialysis after HD catheter is placed.    Consultants:  Nephrology  ID  GI.   Procedures: 8/20 [place] abdo>>>Visualized pancreas has a normal sonographic appearance. No biliary sludge or gallstones. Upper limits of normal to mildly dilated CBD, the similar to the abdomen ultrasound in April. No intrahepatic biliary ductal dilatation identified   8/21 TTE > Mild LVH, EF 60%, PAP 34mHg  Antibiotics:  none  HPI/Subjective: He denies complaints. Ambulating. Frustrated at having to stay here for some more time.  Objective: Filed Vitals:   04/03/14 0918  BP: 130/62  Pulse: 64  Temp: 98.1 F (36.7 C)  Resp: 18    Intake/Output Summary (Last 24 hours) at 04/03/14 1040 Last data filed at 04/02/14 1836  Gross per 24 hour  Intake    480 ml  Output    477 ml  Net      3 ml   Filed Weights   03/31/14 2038 04/01/14 2036 04/02/14 2117  Weight: 49.5 kg (109 lb 2 oz) 49.578 kg (109 lb 4.8 oz) 49.351 kg (108 lb 12.8 oz)    Exam:   General:  Alert in no distress.   Cardiovascular: S 1, S 2 RRR  Respiratory: CTA bilaterally.  No crackles  Abdomen: BS present, soft, NT  Musculoskeletal: no edema.   Data Reviewed: Basic Metabolic Panel:  Recent Labs Lab 03/28/14 0620 03/30/14 0849 03/31/14 0510 04/01/14 0438 04/02/14 0521  NA 136* 141 137 137 136*  K 4.4 4.2 4.4 4.4 4.6  CL 96 100 99 100 97  CO2 19 17* 20 19 16*   GLUCOSE 141* 126* 156* 149* 154*  BUN 78* 65* 82* 97* 107*  CREATININE 7.48* 6.08* 6.90* 7.63* 8.13*  CALCIUM 8.1* 8.5 8.4 8.2* 8.6  MG  --  2.5 2.5 2.3 2.2  PHOS  --  5.8* 5.4* 6.2* 6.4*   Liver Function Tests:  Recent Labs Lab 03/28/14 0620 03/30/14 0849 03/31/14 0510 04/01/14 0438 04/02/14 0521  AST 22  --   --   --   --   ALT 12  --   --   --   --   ALKPHOS 61  --   --   --   --   BILITOT 0.5  --   --   --   --   PROT 5.9*  --   --   --   --   ALBUMIN 2.2* 2.4* 2.5* 2.3* 2.4*    Recent Labs Lab 03/28/14 0620 03/30/14 0849 04/01/14 0438  LIPASE 1481* 740* 827*  AMYLASE 1050*  --   --    CBC:  Recent Labs Lab 03/29/14 0605 03/30/14 0849  WBC 11.6* 7.9  HGB 10.1* 9.4*  HCT 29.5* 27.5*  MCV 92.8 97.2  PLT 103* 100*   CBG:  Recent Labs Lab 04/02/14 0729 04/02/14 1148 04/02/14 1641 04/02/14 2115 04/03/14 0746  GLUCAP 129* 237* 234* 186* 131*    Recent Results (from the past 240 hour(s))  SURGICAL PCR SCREEN     Status: None   Collection Time    04/02/14  9:30 PM      Result Value Ref Range Status   MRSA, PCR NEGATIVE  NEGATIVE Final   Staphylococcus aureus NEGATIVE  NEGATIVE Final   Comment:            The Xpert SA Assay (FDA     approved for NASAL specimens     in patients over 42 years of age),     is one component of     a comprehensive surveillance     program.  Test performance has     been validated by [employer] for patients greater     than or equal to 90 year old.     It is not intended     to diagnose infection nor to     guide or monitor treatment.     Studies: No results found.  Scheduled Meds: . amLODipine  10 mg Oral QHS  . antiseptic oral rinse  7 mL Mouth Rinse q12n4p  . atenolol  50 mg Oral Daily  .  ceFAZolin (ANCEF) IV  2 g Intravenous Once  . chlorhexidine  15 mL Mouth Rinse BID  . doxazosin  1 mg Oral QHS  . insulin aspart  0-15 Units Subcutaneous TID WC  . insulin aspart  0-5 Units Subcutaneous QHS  .  insulin aspart  3 Units Subcutaneous TID WC  . pantoprazole  40 mg Oral QHS  . sodium bicarbonate  650 mg Oral TID   Continuous Infusions: . sodium chloride Stopped (03/26/14 1330)    Active Problems:   AKI (acute kidney injury)   Metabolic acidosis  Hyperkalemia   Hyponatremia   HIV disease   Acute gastroenteritis   Protein-calorie malnutrition, severe   Acute kidney injury   Time spent: 30 minutes.    [facility] Pager [phone].   If 7PM-7AM, please contact night-coverage at [email or web address], password [facility] 04/03/2014, 10:40 AM  LOS: 12 days

## 2014-04-03 NOTE — Sedation Documentation (Signed)
EtCO2 monitor not working properly.  After repeated troubleshooting and new tubing, discontinued EtCO2 monitoring.  Chest rising and falling WNLs, coloring and SpO2 WNLs.

## 2014-04-03 NOTE — Progress Notes (Signed)
Paged Dr. Florene Glen, pt is NPO for a permanent HD cath placement but no order as of yet.

## 2014-04-03 NOTE — Care Management Note (Signed)
CARE MANAGEMENT NOTE 04/03/2014  Patient:  [name], [name]   Account Number:  [id number]  Date Initiated:  03/31/2014  Documentation initiated by:  Travontae Freiberger  Subjective/Objective Assessment:   CM following for progression and d/c planning.     Action/Plan:   03/31/2014 Met with pt re d/c needs continues with acute vs chronic renal failure. IM given for pt to review.   Anticipated DC Date:     Anticipated DC Plan:  [place]         Choice offered to / List presented to:             Status of service:   Medicare Important Message given?  YES (If response is "NO", the following Medicare IM given date fields will be blank) Date Medicare IM given:  03/31/2014 Medicare IM given by:  Ziggy Reveles Date Additional Medicare IM given:  04/03/2014 Additional Medicare IM given by:  [facility]  Discharge Disposition:    Per UR Regulation:    If discussed at Long Length of Stay Meetings, dates discussed:    Comments:

## 2014-04-03 NOTE — Sedation Documentation (Signed)
Patient denies pain and is resting comfortably.  

## 2014-04-03 NOTE — Progress Notes (Signed)
Pt gone to IR via bed for HD cath placement and dialysis afterward.

## 2014-04-03 NOTE — Progress Notes (Signed)
Called IR will be coming to get pt in about 1 hour and a half.

## 2014-04-04 LAB — BASIC METABOLIC PANEL
ANION GAP: 17 — AB (ref 5–15)
BUN: 37 mg/dL — ABNORMAL HIGH (ref 6–23)
CO2: 25 meq/L (ref 19–32)
CREATININE: 4.08 mg/dL — AB (ref 0.50–1.35)
Calcium: 8.8 mg/dL (ref 8.4–10.5)
Chloride: 97 mEq/L (ref 96–112)
GFR calc Af Amer: 16 mL/min — ABNORMAL LOW (ref >90)
GFR calc non Af Amer: 14 mL/min — ABNORMAL LOW (ref >90)
GLUCOSE: 261 mg/dL — AB (ref 70–99)
Potassium: 4.3 mEq/L (ref 3.7–5.3)
Sodium: 139 mEq/L (ref 137–147)

## 2014-04-04 LAB — GLUCOSE, CAPILLARY
GLUCOSE-CAPILLARY: 119 mg/dL — AB (ref 70–99)
Glucose-Capillary: 278 mg/dL — ABNORMAL HIGH (ref 70–99)
Glucose-Capillary: 212 mg/dL — ABNORMAL HIGH (ref 70–99)
Glucose-Capillary: 147 mg/dL — ABNORMAL HIGH (ref 70–99)

## 2014-04-04 NOTE — Progress Notes (Signed)
Assessment/ Plan:   1. ARF: Had good UOP overnight with 850cc urine; s/p new PC Will watch for clinical improvement and hold on dialysis 2. Metabolic Acidosis: Will stop PO bicarbonate 3. Pancreatitis- clinically improving and tolerating current diet without abdominal pain  4. Hyponatremia- improved with dialysis 5. HIV- per ID 6. HTN- blood pressure improved and currently well controlled on atenolol/amlodipine and doxazosin  Subjective: Interval History: feels better overall  Objective: Vital signs in last 24 hours: Temp:  [97.3 F (36.3 C)-98.1 F (36.7 C)] 98.1 F (36.7 C) (09/01 1137) Pulse Rate:  [52-74] 61 (09/01 1137) Resp:  [9-19] 18 (09/01 1137) BP: (107-140)/(54-79) 107/63 mmHg (09/01 1137) SpO2:  [97 %-100 %] 97 % (09/01 1137) Weight:  [47.7 kg (105 lb 2.6 oz)-48.3 kg (106 lb 7.7 oz)] 47.701 kg (105 lb 2.6 oz) (08/31 2034) Weight change: -1.051 kg (-2 lb 5.1 oz)  Intake/Output from previous day: 08/31 0701 - 09/01 0700 In: 240 [P.O.:240] Out: 953 [Urine:950] Intake/Output this shift: Total I/O In: 240 [P.O.:240] Out: -   General appearance: alert and cooperative Resp: clear to auscultation bilaterally Chest wall: no tenderness Cardio: regular rate and rhythm, S1, S2 normal, no murmur, click, rub or gallop Extremities: extremities normal, atraumatic, no cyanosis or edema  Lab Results:  Recent Labs  04/03/14 1016  WBC 5.7  HGB 8.6*  HCT 24.4*  PLT 136*   BMET:  Recent Labs  04/03/14 1016 04/04/14 0939  NA 137 139  K 5.1 4.3  CL 99 97  CO2 17* 25  GLUCOSE 144* 261*  BUN 122* 37*  CREATININE 9.06* 4.08*  CALCIUM 9.1 8.8   No results found for this basename: PTH,  in the last 72 hours Iron Studies: No results found for this basename: IRON, TIBC, TRANSFERRIN, FERRITIN,  in the last 72 hours Studies/Results: Ir Fluoro Guide Cv Line Right  04/03/2014   CLINICAL DATA:  Renal failure.  EXAM: TUNNELED CENTRAL VENOUS HEMODIALYSIS CATHETER PLACEMENT  WITH ULTRASOUND AND FLUOROSCOPIC GUIDANCE  ANESTHESIA/SEDATION: 0.5 mg IV Versed; 25 mcg IV Fentanyl.  Total Moderate Sedation Time  45 minutes.  MEDICATIONS: 2 g IV Ancef. As antibiotic prophylaxis, Ancef was ordered pre-procedure and administered intravenously within one hour of incision.  FLUOROSCOPY TIME:  1 min and 36 seconds.  PROCEDURE: The procedure, risks, benefits, and alternatives were explained to the patient. Questions regarding the procedure were encouraged and answered. The patient understands and consents to the procedure.  The right neck and chest were prepped with chlorhexidine in a sterile fashion, and a sterile drape was applied covering the operative field. Maximum barrier sterile technique with sterile gowns and gloves were used for the procedure. Local anesthesia was provided with 1% lidocaine.  Ultrasound was used to confirm patency of the right internal jugular vein. After creating a small venotomy incision, a 21 gauge needle was advanced into the right internal jugular vein under direct, real-time ultrasound guidance. Ultrasound image documentation was performed. After securing guidewire access, an 8 Fr dilator was placed. A J-wire was kinked to measure appropriate catheter length.  A Bard Hemosplit tunneled hemodialysis catheter measuring 23 cm from tip to cuff was chosen for placement. This was tunneled in a retrograde fashion from the chest wall to the venotomy incision.  At the venotomy, serial dilatation was performed and a 16 Fr peel-away sheath was placed over a guidewire. The catheter was then placed through the sheath and the sheath removed. Final catheter positioning was confirmed and documented with a fluoroscopic  spot image. The catheter was aspirated, flushed with saline, and injected with appropriate volume heparin dwells.  The venotomy incision was closed with subcutaneous 3-0 Monocryl and subcuticular 4-0 Vicryl. Dermabond was applied to the incision. The catheter exit site  was secured with 0-Prolene retention sutures.  COMPLICATIONS: None.  No pneumothorax.  FINDINGS: After catheter placement, the tips lie in the right atrium. The catheter aspirates normally and is ready for immediate use.  IMPRESSION: Placement of tunneled hemodialysis catheter via the right internal jugular vein. The catheter tips lie in the right atrium. The catheter is ready for immediate use.   Electronically Signed   By: Aletta Edouard M.D.   On: 04/03/2014 14:14   Ir US Guide Vasc Access Right  04/03/2014   CLINICAL DATA:  Renal failure.  EXAM: TUNNELED CENTRAL VENOUS HEMODIALYSIS CATHETER PLACEMENT WITH ULTRASOUND AND FLUOROSCOPIC GUIDANCE  ANESTHESIA/SEDATION: 0.5 mg IV Versed; 25 mcg IV Fentanyl.  Total Moderate Sedation Time  45 minutes.  MEDICATIONS: 2 g IV Ancef. As antibiotic prophylaxis, Ancef was ordered pre-procedure and administered intravenously within one hour of incision.  FLUOROSCOPY TIME:  1 min and 36 seconds.  PROCEDURE: The procedure, risks, benefits, and alternatives were explained to the patient. Questions regarding the procedure were encouraged and answered. The patient understands and consents to the procedure.  The right neck and chest were prepped with chlorhexidine in a sterile fashion, and a sterile drape was applied covering the operative field. Maximum barrier sterile technique with sterile gowns and gloves were used for the procedure. Local anesthesia was provided with 1% lidocaine.  Ultrasound was used to confirm patency of the right internal jugular vein. After creating a small venotomy incision, a 21 gauge needle was advanced into the right internal jugular vein under direct, real-time ultrasound guidance. Ultrasound image documentation was performed. After securing guidewire access, an 8 Fr dilator was placed. A J-wire was kinked to measure appropriate catheter length.  A Bard Hemosplit tunneled hemodialysis catheter measuring 23 cm from tip to cuff was chosen for  placement. This was tunneled in a retrograde fashion from the chest wall to the venotomy incision.  At the venotomy, serial dilatation was performed and a 16 Fr peel-away sheath was placed over a guidewire. The catheter was then placed through the sheath and the sheath removed. Final catheter positioning was confirmed and documented with a fluoroscopic spot image. The catheter was aspirated, flushed with saline, and injected with appropriate volume heparin dwells.  The venotomy incision was closed with subcutaneous 3-0 Monocryl and subcuticular 4-0 Vicryl. Dermabond was applied to the incision. The catheter exit site was secured with 0-Prolene retention sutures.  COMPLICATIONS: None.  No pneumothorax.  FINDINGS: After catheter placement, the tips lie in the right atrium. The catheter aspirates normally and is ready for immediate use.  IMPRESSION: Placement of tunneled hemodialysis catheter via the right internal jugular vein. The catheter tips lie in the right atrium. The catheter is ready for immediate use.   Electronically Signed   By: Aletta Edouard M.D.   On: 04/03/2014 14:14   Scheduled: . amLODipine  10 mg Oral QHS  . antiseptic oral rinse  7 mL Mouth Rinse q12n4p  . atenolol  50 mg Oral Daily  . chlorhexidine  15 mL Mouth Rinse BID  . doxazosin  1 mg Oral QHS  . insulin aspart  0-15 Units Subcutaneous TID WC  . insulin aspart  0-5 Units Subcutaneous QHS  . insulin aspart  3 Units Subcutaneous TID WC  .  pantoprazole  40 mg Oral QHS  . sodium bicarbonate  650 mg Oral TID     LOS: 13 days   Gabreal Worton C 04/04/2014,12:37 PM

## 2014-04-04 NOTE — Progress Notes (Signed)
INFECTIOUS DISEASE PROGRESS NOTE  ID: [name] is a 68 y.o. male with  Active Problems:   AKI (acute kidney injury)   Metabolic acidosis   Hyperkalemia   Hyponatremia   HIV disease   Acute gastroenteritis   Protein-calorie malnutrition, severe   Acute kidney injury  Subjective: Without complaints. No abd pain.   Abtx:  Anti-infectives   Start     Dose/Rate Route Frequency Ordered Stop   04/03/14 1300  ceFAZolin (ANCEF) IVPB 2 g/50 mL premix     2 g 100 mL/hr over 30 Minutes Intravenous  Once 04/02/14 1104 04/03/14 1330   04/03/14 1300  ceFAZolin (ANCEF) powder       As needed 04/03/14 1306 04/03/14 1411   04/03/14 1250  ceFAZolin (ANCEF) 2-3 GM-% IVPB SOLR    Comments:  [name], [name]   : cabinet override      04/03/14 1250 04/03/14 1327   03/25/14 1130  vancomycin (VANCOCIN) IVPB 750 mg/150 ml premix  Status:  Discontinued     750 mg 150 mL/hr over 60 Minutes Intravenous Every 48 hours 03/23/14 1038 03/24/14 1237   03/23/14 1130  vancomycin (VANCOCIN) IVPB 1000 mg/200 mL premix     1,000 mg 200 mL/hr over 60 Minutes Intravenous  Once 03/23/14 1038 03/23/14 1500   03/23/14 1045  imipenem-cilastatin (PRIMAXIN) 250 mg in sodium chloride 0.9 % 100 mL IVPB  Status:  Discontinued     250 mg 200 mL/hr over 30 Minutes Intravenous Every 12 hours 03/23/14 1038 03/24/14 1237      Medications:  Scheduled: . amLODipine  10 mg Oral QHS  . antiseptic oral rinse  7 mL Mouth Rinse q12n4p  . atenolol  50 mg Oral Daily  . chlorhexidine  15 mL Mouth Rinse BID  . doxazosin  1 mg Oral QHS  . insulin aspart  0-15 Units Subcutaneous TID WC  . insulin aspart  0-5 Units Subcutaneous QHS  . insulin aspart  3 Units Subcutaneous TID WC  . pantoprazole  40 mg Oral QHS  . sodium bicarbonate  650 mg Oral TID    Objective: Vital signs in last 24 hours: Temp:  [97.3 F (36.3 C)-98.1 F (36.7 C)] 98.1 F (36.7 C) (09/01 1137) Pulse Rate:  [52-74] 61 (09/01 1137) Resp:  [12-18] 18  (09/01 1137) BP: (107-134)/(54-79) 107/63 mmHg (09/01 1137) SpO2:  [97 %-100 %] 97 % (09/01 1137) Weight:  [47.7 kg (105 lb 2.6 oz)-47.701 kg (105 lb 2.6 oz)] 47.701 kg (105 lb 2.6 oz) (08/31 2034)   General appearance: alert, cooperative and no distress Eyes: EOMI Resp: clear to auscultation bilaterally and R chest HD line is clean.  Cardio: regular rate and rhythm GI: normal findings: bowel sounds normal and soft, non-tender  Lab Results  Recent Labs  04/03/14 1016 04/04/14 0939  WBC 5.7  --   HGB 8.6*  --   HCT 24.4*  --   NA 137 139  K 5.1 4.3  CL 99 97  CO2 17* 25  BUN 122* 37*  CREATININE 9.06* 4.08*   Liver Panel  Recent Labs  04/02/14 0521  ALBUMIN 2.4*   Sedimentation Rate No results found for this basename: ESRSEDRATE,  in the last 72 hours C-Reactive Protein No results found for this basename: CRP,  in the last 72 hours  Microbiology: Recent Results (from the past 240 hour(s))  SURGICAL PCR SCREEN     Status: None   Collection Time    04/02/14  9:30 PM      Result Value Ref Range Status   MRSA, PCR NEGATIVE  NEGATIVE Final   Staphylococcus aureus NEGATIVE  NEGATIVE Final   Comment:            The Xpert SA Assay (FDA     approved for NASAL specimens     in patients over 49 years of age),     is one component of     a comprehensive surveillance     program.  Test performance has     been validated by [employer] for patients greater     than or equal to 80 year old.     It is not intended     to diagnose infection nor to     guide or monitor treatment.    Studies/Results: Ir Fluoro Guide Cv Line Right  04/03/2014   CLINICAL DATA:  Renal failure.  EXAM: TUNNELED CENTRAL VENOUS HEMODIALYSIS CATHETER PLACEMENT WITH ULTRASOUND AND FLUOROSCOPIC GUIDANCE  ANESTHESIA/SEDATION: 0.5 mg IV Versed; 25 mcg IV Fentanyl.  Total Moderate Sedation Time  45 minutes.  MEDICATIONS: 2 g IV Ancef. As antibiotic prophylaxis, Ancef was ordered pre-procedure and  administered intravenously within one hour of incision.  FLUOROSCOPY TIME:  1 min and 36 seconds.  PROCEDURE: The procedure, risks, benefits, and alternatives were explained to the patient. Questions regarding the procedure were encouraged and answered. The patient understands and consents to the procedure.  The right neck and chest were prepped with chlorhexidine in a sterile fashion, and a sterile drape was applied covering the operative field. Maximum barrier sterile technique with sterile gowns and gloves were used for the procedure. Local anesthesia was provided with 1% lidocaine.  Ultrasound was used to confirm patency of the right internal jugular vein. After creating a small venotomy incision, a 21 gauge needle was advanced into the right internal jugular vein under direct, real-time ultrasound guidance. Ultrasound image documentation was performed. After securing guidewire access, an 8 Fr dilator was placed. A J-wire was kinked to measure appropriate catheter length.  A Bard Hemosplit tunneled hemodialysis catheter measuring 23 cm from tip to cuff was chosen for placement. This was tunneled in a retrograde fashion from the chest wall to the venotomy incision.  At the venotomy, serial dilatation was performed and a 16 Fr peel-away sheath was placed over a guidewire. The catheter was then placed through the sheath and the sheath removed. Final catheter positioning was confirmed and documented with a fluoroscopic spot image. The catheter was aspirated, flushed with saline, and injected with appropriate volume heparin dwells.  The venotomy incision was closed with subcutaneous 3-0 Monocryl and subcuticular 4-0 Vicryl. Dermabond was applied to the incision. The catheter exit site was secured with 0-Prolene retention sutures.  COMPLICATIONS: None.  No pneumothorax.  FINDINGS: After catheter placement, the tips lie in the right atrium. The catheter aspirates normally and is ready for immediate use.  IMPRESSION:  Placement of tunneled hemodialysis catheter via the right internal jugular vein. The catheter tips lie in the right atrium. The catheter is ready for immediate use.   Electronically Signed   By: Aletta Edouard M.D.   On: 04/03/2014 14:14   Ir US Guide Vasc Access Right  04/03/2014   CLINICAL DATA:  Renal failure.  EXAM: TUNNELED CENTRAL VENOUS HEMODIALYSIS CATHETER PLACEMENT WITH ULTRASOUND AND FLUOROSCOPIC GUIDANCE  ANESTHESIA/SEDATION: 0.5 mg IV Versed; 25 mcg IV Fentanyl.  Total Moderate Sedation Time  45 minutes.  MEDICATIONS:  2 g IV Ancef. As antibiotic prophylaxis, Ancef was ordered pre-procedure and administered intravenously within one hour of incision.  FLUOROSCOPY TIME:  1 min and 36 seconds.  PROCEDURE: The procedure, risks, benefits, and alternatives were explained to the patient. Questions regarding the procedure were encouraged and answered. The patient understands and consents to the procedure.  The right neck and chest were prepped with chlorhexidine in a sterile fashion, and a sterile drape was applied covering the operative field. Maximum barrier sterile technique with sterile gowns and gloves were used for the procedure. Local anesthesia was provided with 1% lidocaine.  Ultrasound was used to confirm patency of the right internal jugular vein. After creating a small venotomy incision, a 21 gauge needle was advanced into the right internal jugular vein under direct, real-time ultrasound guidance. Ultrasound image documentation was performed. After securing guidewire access, an 8 Fr dilator was placed. A J-wire was kinked to measure appropriate catheter length.  A Bard Hemosplit tunneled hemodialysis catheter measuring 23 cm from tip to cuff was chosen for placement. This was tunneled in a retrograde fashion from the chest wall to the venotomy incision.  At the venotomy, serial dilatation was performed and a 16 Fr peel-away sheath was placed over a guidewire. The catheter was then placed through  the sheath and the sheath removed. Final catheter positioning was confirmed and documented with a fluoroscopic spot image. The catheter was aspirated, flushed with saline, and injected with appropriate volume heparin dwells.  The venotomy incision was closed with subcutaneous 3-0 Monocryl and subcuticular 4-0 Vicryl. Dermabond was applied to the incision. The catheter exit site was secured with 0-Prolene retention sutures.  COMPLICATIONS: None.  No pneumothorax.  FINDINGS: After catheter placement, the tips lie in the right atrium. The catheter aspirates normally and is ready for immediate use.  IMPRESSION: Placement of tunneled hemodialysis catheter via the right internal jugular vein. The catheter tips lie in the right atrium. The catheter is ready for immediate use.   Electronically Signed   By: Aletta Edouard M.D.   On: 04/03/2014 14:14     Assessment/Plan: HIV+ ARF (tunneled HD catheter placed 8-31) Pancreatitis  Total days of antibiotics: none       ARV on hold while watching his renal function with question that his stribild caused his current illness. Will watch.   HIV 1 RNA Quant (copies/mL)  Date Value  01/26/2014 <20   09/29/2012 <20   06/22/2012 <20      HIV-1 RNA Viral Load (no units)  Date Value  07/14/2013 <40   04/13/2013 <40   01/18/2013 <40      CD4 (no units)  Date Value  07/14/2013 514   04/13/2013 498   01/18/2013 522      CD4 T Cell Abs (/uL)  Date Value  01/26/2014 320*  06/22/2012 430   04/17/2011 [facility] (pager) [phone] [email or web address] 04/04/2014, 2:43 PM  LOS: 13 days   **Disclaimer: This note may have been dictated with voice recognition software. Similar sounding words can inadvertently be transcribed and this note may contain transcription errors which may not have been corrected upon publication of note.**

## 2014-04-04 NOTE — Progress Notes (Addendum)
[facility] PROGRESS NOTE  SIGMOND PATALANO ZJI:[id number] DOB: Jul 22, 1946 DOA: 03/22/2014 PCP: Penni Homans, MD  Brief HPI: 68 year old male with HIV on STRIBILD presented to [place] c/o emesis, diarrhea, and generalized weakness x 3 days. In ED found to be in renal failure with elevated lipase. Transferred to [facility] for ICU admission. Patient subsequently transfer to [facility]. Patient admitted with respiratory failure, renal failure, and acute pancreatitis. He required intermittent dialysis. His respiratory failure has improved after volume removed with dialysis. Pancreatitis is improved. Renal function was getting worse. He was started on dialysis on 8/31 after a HD catheter was placed by IR.  Assessment/Plan: Acute renal failure, oliguric , High AG metabolic acidosis (suspect lactic, uremic) He was dialysed 8/31. Patient making urine. Plan is to watch him again off dialysis. Initially thought to be secondary to volume depletion, diuretics, SIRS. Had dialysis 8/21, 8/22, 8/25. And then he was being monitored closely. But his creatinine continued to rise and so ws started on HD as mentioned earlier. Continue Sod bicarb. Further management per Nephrology. Femoral dialysis catheter was removed 8/28.   Acute pancreatitis He is much improved. This was possibly due to STRIBILD (hx of this with other ARV meds). Abd [place] > no obvious pancreatic abnormality, similar dilated CBD compared with 4/15. HIV medications can cause elevation of amylase and lipase. MRI, MRCP as below. GI has signed off.  Acute on chronic respiratory failure  Resolved. Likely due to uncompensated met acidosis pulm edema? Vs pancreatitis induced ALI. Repeated chest x ray: 8-24: Persistent asymmetric airspace process, likely bilateral infiltrates. No need for antibiotics per ID.   DM2 uncontrolled CBG's increasing. Was on Metformin at home which cannot be restarted due to renal failure. Improved some with meal coverage. May need  to utilize Lantus depending on CBG's. A1c was 7.5 in July. Monitor for now.  Chronic Diarrhea Ongoing for last 6 months. C diff was negative.   Thrombocytopenia Improved.   HIV Holding HIV medications as they could have caused pancreatitis. ID following and will resume meds after discharge.   COPD without evidence of exacerbation.  Stable  Hypertension BP better. Will not be too aggressive as he is now on dialysis. Increased Amlodipine dose 8/26. Continue atenolol. May need to taper down meds if BP starts dropping.  Acute encephalopathy in setting of acidosis Resolved  Hyperkalemia  Resolved  DVT Prophylaxis: SCD's Code Status: Full Code.  Family Communication: Discussed with patient.  Disposition Plan: Unclear for now. Per Nephrology.    Consultants:  Nephrology  ID  GI.   Procedures: 8/20 [place] abdo>>>Visualized pancreas has a normal sonographic appearance. No biliary sludge or gallstones. Upper limits of normal to mildly dilated CBD, the similar to the abdomen ultrasound in April. No intrahepatic biliary ductal dilatation identified   8/21 TTE > Mild LVH, EF 60%, PAP 33mHg  8/31: Tunneled HD catheter placed by IR  Antibiotics:  none  HPI/Subjective: He feels well. No complaints. Ambulating. Frustrated at having to stay here for some more time.  Objective: Filed Vitals:   04/04/14 1137  BP: 107/63  Pulse: 61  Temp: 98.1 F (36.7 C)  Resp: 18    Intake/Output Summary (Last 24 hours) at 04/04/14 1241 Last data filed at 04/04/14 0900  Gross per 24 hour  Intake    480 ml  Output    953 ml  Net   -473 ml   Filed Weights   04/03/14 1425 04/03/14 1841 04/03/14 2034  Weight: 48.3  kg (106 lb 7.7 oz) 47.7 kg (105 lb 2.6 oz) 47.701 kg (105 lb 2.6 oz)    Exam:   General:  Alert in no distress.   Cardiovascular: S 1, S 2 RRR  Respiratory: CTA bilaterally. No crackles  Abdomen: BS present, soft, NT  Musculoskeletal: no edema.   Data  Reviewed: Basic Metabolic Panel:  Recent Labs Lab 03/30/14 0849 03/31/14 0510 04/01/14 0438 04/02/14 0521 04/03/14 1016 04/04/14 0939  NA 141 137 137 136* 137 139  K 4.2 4.4 4.4 4.6 5.1 4.3  CL 100 99 100 97 99 97  CO2 17* 20 19 16* 17* 25  GLUCOSE 126* 156* 149* 154* 144* 261*  BUN 65* 82* 97* 107* 122* 37*  CREATININE 6.08* 6.90* 7.63* 8.13* 9.06* 4.08*  CALCIUM 8.5 8.4 8.2* 8.6 9.1 8.8  MG 2.5 2.5 2.3 2.2  --   --   PHOS 5.8* 5.4* 6.2* 6.4*  --   --    Liver Function Tests:  Recent Labs Lab 03/30/14 0849 03/31/14 0510 04/01/14 0438 04/02/14 0521  ALBUMIN 2.4* 2.5* 2.3* 2.4*    Recent Labs Lab 03/30/14 0849 04/01/14 0438  LIPASE 740* 827*   CBC:  Recent Labs Lab 03/29/14 0605 03/30/14 0849 04/03/14 1016  WBC 11.6* 7.9 5.7  HGB 10.1* 9.4* 8.6*  HCT 29.5* 27.5* 24.4*  MCV 92.8 97.2 94.9  PLT 103* 100* 136*   CBG:  Recent Labs Lab 04/03/14 0746 04/03/14 1121 04/03/14 2032 04/04/14 0745 04/04/14 1134  GLUCAP 131* 127* 126* 119* 278*    Recent Results (from the past 240 hour(s))  SURGICAL PCR SCREEN     Status: None   Collection Time    04/02/14  9:30 PM      Result Value Ref Range Status   MRSA, PCR NEGATIVE  NEGATIVE Final   Staphylococcus aureus NEGATIVE  NEGATIVE Final   Comment:            The Xpert SA Assay (FDA     approved for NASAL specimens     in patients over 70 years of age),     is one component of     a comprehensive surveillance     program.  Test performance has     been validated by [employer] for patients greater     than or equal to 63 year old.     It is not intended     to diagnose infection nor to     guide or monitor treatment.     Studies: Ir Fluoro Guide Cv Line Right  04/03/2014   CLINICAL DATA:  Renal failure.  EXAM: TUNNELED CENTRAL VENOUS HEMODIALYSIS CATHETER PLACEMENT WITH ULTRASOUND AND FLUOROSCOPIC GUIDANCE  ANESTHESIA/SEDATION: 0.5 mg IV Versed; 25 mcg IV Fentanyl.  Total Moderate Sedation  Time  45 minutes.  MEDICATIONS: 2 g IV Ancef. As antibiotic prophylaxis, Ancef was ordered pre-procedure and administered intravenously within one hour of incision.  FLUOROSCOPY TIME:  1 min and 36 seconds.  PROCEDURE: The procedure, risks, benefits, and alternatives were explained to the patient. Questions regarding the procedure were encouraged and answered. The patient understands and consents to the procedure.  The right neck and chest were prepped with chlorhexidine in a sterile fashion, and a sterile drape was applied covering the operative field. Maximum barrier sterile technique with sterile gowns and gloves were used for the procedure. Local anesthesia was provided with 1% lidocaine.  Ultrasound was used to confirm patency of the  right internal jugular vein. After creating a small venotomy incision, a 21 gauge needle was advanced into the right internal jugular vein under direct, real-time ultrasound guidance. Ultrasound image documentation was performed. After securing guidewire access, an 8 Fr dilator was placed. A J-wire was kinked to measure appropriate catheter length.  A Bard Hemosplit tunneled hemodialysis catheter measuring 23 cm from tip to cuff was chosen for placement. This was tunneled in a retrograde fashion from the chest wall to the venotomy incision.  At the venotomy, serial dilatation was performed and a 16 Fr peel-away sheath was placed over a guidewire. The catheter was then placed through the sheath and the sheath removed. Final catheter positioning was confirmed and documented with a fluoroscopic spot image. The catheter was aspirated, flushed with saline, and injected with appropriate volume heparin dwells.  The venotomy incision was closed with subcutaneous 3-0 Monocryl and subcuticular 4-0 Vicryl. Dermabond was applied to the incision. The catheter exit site was secured with 0-Prolene retention sutures.  COMPLICATIONS: None.  No pneumothorax.  FINDINGS: After catheter placement, the  tips lie in the right atrium. The catheter aspirates normally and is ready for immediate use.  IMPRESSION: Placement of tunneled hemodialysis catheter via the right internal jugular vein. The catheter tips lie in the right atrium. The catheter is ready for immediate use.   Electronically Signed   By: Aletta Edouard M.D.   On: 04/03/2014 14:14   Ir US Guide Vasc Access Right  04/03/2014   CLINICAL DATA:  Renal failure.  EXAM: TUNNELED CENTRAL VENOUS HEMODIALYSIS CATHETER PLACEMENT WITH ULTRASOUND AND FLUOROSCOPIC GUIDANCE  ANESTHESIA/SEDATION: 0.5 mg IV Versed; 25 mcg IV Fentanyl.  Total Moderate Sedation Time  45 minutes.  MEDICATIONS: 2 g IV Ancef. As antibiotic prophylaxis, Ancef was ordered pre-procedure and administered intravenously within one hour of incision.  FLUOROSCOPY TIME:  1 min and 36 seconds.  PROCEDURE: The procedure, risks, benefits, and alternatives were explained to the patient. Questions regarding the procedure were encouraged and answered. The patient understands and consents to the procedure.  The right neck and chest were prepped with chlorhexidine in a sterile fashion, and a sterile drape was applied covering the operative field. Maximum barrier sterile technique with sterile gowns and gloves were used for the procedure. Local anesthesia was provided with 1% lidocaine.  Ultrasound was used to confirm patency of the right internal jugular vein. After creating a small venotomy incision, a 21 gauge needle was advanced into the right internal jugular vein under direct, real-time ultrasound guidance. Ultrasound image documentation was performed. After securing guidewire access, an 8 Fr dilator was placed. A J-wire was kinked to measure appropriate catheter length.  A Bard Hemosplit tunneled hemodialysis catheter measuring 23 cm from tip to cuff was chosen for placement. This was tunneled in a retrograde fashion from the chest wall to the venotomy incision.  At the venotomy, serial dilatation  was performed and a 16 Fr peel-away sheath was placed over a guidewire. The catheter was then placed through the sheath and the sheath removed. Final catheter positioning was confirmed and documented with a fluoroscopic spot image. The catheter was aspirated, flushed with saline, and injected with appropriate volume heparin dwells.  The venotomy incision was closed with subcutaneous 3-0 Monocryl and subcuticular 4-0 Vicryl. Dermabond was applied to the incision. The catheter exit site was secured with 0-Prolene retention sutures.  COMPLICATIONS: None.  No pneumothorax.  FINDINGS: After catheter placement, the tips lie in the right atrium. The catheter aspirates normally and  is ready for immediate use.  IMPRESSION: Placement of tunneled hemodialysis catheter via the right internal jugular vein. The catheter tips lie in the right atrium. The catheter is ready for immediate use.   Electronically Signed   By: Aletta Edouard M.D.   On: 04/03/2014 14:14    Scheduled Meds: . amLODipine  10 mg Oral QHS  . antiseptic oral rinse  7 mL Mouth Rinse q12n4p  . atenolol  50 mg Oral Daily  . chlorhexidine  15 mL Mouth Rinse BID  . doxazosin  1 mg Oral QHS  . insulin aspart  0-15 Units Subcutaneous TID WC  . insulin aspart  0-5 Units Subcutaneous QHS  . insulin aspart  3 Units Subcutaneous TID WC  . pantoprazole  40 mg Oral QHS  . sodium bicarbonate  650 mg Oral TID   Continuous Infusions: . sodium chloride Stopped (03/26/14 1330)    Active Problems:   AKI (acute kidney injury)   Metabolic acidosis   Hyperkalemia   Hyponatremia   HIV disease   Acute gastroenteritis   Protein-calorie malnutrition, severe   Acute kidney injury   Time spent: 30 minutes.    [facility] Pager [phone].   If 7PM-7AM, please contact night-coverage at [email or web address], password [email or web address] 04/04/2014, 12:41 PM  LOS: 13 days

## 2014-04-05 LAB — GLUCOSE, CAPILLARY
Glucose-Capillary: 143 mg/dL — ABNORMAL HIGH (ref 70–99)
Glucose-Capillary: 270 mg/dL — ABNORMAL HIGH (ref 70–99)
Glucose-Capillary: 138 mg/dL — ABNORMAL HIGH (ref 70–99)
Glucose-Capillary: 121 mg/dL — ABNORMAL HIGH (ref 70–99)

## 2014-04-05 LAB — CBC
HCT: 21.6 % — ABNORMAL LOW (ref 39.0–52.0)
HEMOGLOBIN: 7.6 g/dL — AB (ref 13.0–17.0)
MCH: 34.2 pg — ABNORMAL HIGH (ref 26.0–34.0)
MCHC: 35.2 g/dL (ref 30.0–36.0)
MCV: 97.3 fL (ref 78.0–100.0)
PLATELETS: 136 10*3/uL — AB (ref 150–400)
RBC: 2.22 MIL/uL — AB (ref 4.22–5.81)
RDW: 12.8 % (ref 11.5–15.5)
WBC: 4.6 10*3/uL (ref 4.0–10.5)

## 2014-04-05 LAB — BASIC METABOLIC PANEL
Anion gap: 16 — ABNORMAL HIGH (ref 5–15)
BUN: 56 mg/dL — AB (ref 6–23)
CO2: 24 mEq/L (ref 19–32)
Calcium: 8.2 mg/dL — ABNORMAL LOW (ref 8.4–10.5)
Chloride: 98 mEq/L (ref 96–112)
Creatinine, Ser: 5.08 mg/dL — ABNORMAL HIGH (ref 0.50–1.35)
GFR calc Af Amer: 13 mL/min — ABNORMAL LOW (ref >90)
GFR, EST NON AFRICAN AMERICAN: 11 mL/min — AB (ref >90)
GLUCOSE: 147 mg/dL — AB (ref 70–99)
POTASSIUM: 3.8 meq/L (ref 3.7–5.3)
SODIUM: 138 meq/L (ref 137–147)

## 2014-04-05 LAB — PREPARE RBC (CROSSMATCH)

## 2014-04-05 LAB — ABO/RH: ABO/RH(D): O NEG

## 2014-04-05 MED ORDER — SODIUM CHLORIDE 0.9 % IV SOLN
Freq: Once | INTRAVENOUS | Status: AC
  Administered 2014-04-05: 17:00:00 via INTRAVENOUS

## 2014-04-05 NOTE — Evaluation (Signed)
Occupational Therapy Evaluation Patient Details Name: [name] MRN: [id number] DOB: June 23, 1946 Today's Date: 04/05/2014    History of Present Illness Pt admitted with acute kidney injury requiring dialysis, metabolic acidosis, hyperkalemia, hyponatremia, pancreatitis, and severe protein malnutrition.  Pt is HIV+ and has a hx of HTN.   Clinical Impression   Pt is performing ADL and ADL transfers independently. He has reliable intermittent assist at home for IADL.  No further OT needs.   Follow Up Recommendations  No OT follow up;Supervision - Intermittent    Equipment Recommendations  None recommended by OT    Recommendations for Other Services       Precautions / Restrictions Restrictions Weight Bearing Restrictions: No      Mobility Bed Mobility                  Transfers Overall transfer level: Independent               General transfer comment: Pt demonstrated simulated tub transfer.    Balance                                            ADL Overall ADL's : Independent                                       General ADL Comments: Pt reports showering in standing while here.  Had just completed shaving standing at sink upon OTs entry.     Vision                     Perception     Praxis      Pertinent Vitals/Pain Pain Assessment: No/denies pain     Hand Dominance Right   Extremity/Trunk Assessment Upper Extremity Assessment Upper Extremity Assessment: Overall WFL for tasks assessed   Lower Extremity Assessment Lower Extremity Assessment: Defer to PT evaluation       Communication Communication Communication: No difficulties   Cognition Arousal/Alertness: Awake/alert Behavior During Therapy: WFL for tasks assessed/performed Overall Cognitive Status: Within Functional Limits for tasks assessed                     General Comments       Exercises       Shoulder Instructions       Home Living Family/patient expects to be discharged to:: Private residence Living Arrangements: Alone Available Help at Discharge: Friend(s);Family;Available PRN/intermittently Type of Home: Apartment Home Access: Level entry     Home Layout: One level     Bathroom Shower/Tub: Teacher, early years/pre: Handicapped height     Home Equipment: Grab bars - tub/shower   Additional Comments: Pt's apartment is handicap accessible.      Prior Functioning/Environment Level of Independence: Independent        Comments: drives    OT Diagnosis:     OT Problem List:     OT Treatment/Interventions:      OT Goals(Current goals can be found in the care plan section) Acute Rehab OT Goals Patient Stated Goal: go home  OT Frequency:     Barriers to D/C:            Co-evaluation  End of Session    Activity Tolerance: Patient tolerated treatment well Patient left:  (with PT)   Time: 8875-7972 OT Time Calculation (min): 13 min Charges:  OT General Charges $OT Visit: 1 Procedure OT Evaluation $Initial OT Evaluation Tier I: 1 Procedure G-Codes:    [name] So 04/05/2014, 11:38 AM [phone]

## 2014-04-05 NOTE — Progress Notes (Signed)
Inpatient Diabetes Program Recommendations  AACE/ADA: New Consensus Statement on Inpatient Glycemic Control (2013)  Target Ranges:  Prepandial:   less than 140 mg/dL      Peak postprandial:   less than 180 mg/dL (1-2 hours)      Critically ill patients:  140 - 180 mg/dL     Results for [name], [name] (MRN [id number]) as of 04/05/2014 13:44  Ref. Range 04/04/2014 07:45 04/04/2014 11:34 04/04/2014 16:37 04/04/2014 21:17  Glucose-Capillary Latest Range: 70-99 mg/dL 119 (H) 278 (H) 212 (H) 147 (H)    Results for [name], [name] (MRN [id number]) as of 04/05/2014 13:44  Ref. Range 04/05/2014 08:22 04/05/2014 13:12  Glucose-Capillary Latest Range: 70-99 mg/dL 143 (H) 270 (H)     Current orders: Novolog Moderate SSI + Novolog 3 units tidwc (Meal Coverage)  Patient still having issues with postprandial glucose elevations.    MD- Please consider increasing Novolog Meal Coverage to 4 units tid with meals     Will follow Wyn Quaker RN, MSN, CDE Diabetes Coordinator Inpatient Diabetes Program Team Pager: [phone] (8a-10p)

## 2014-04-05 NOTE — Progress Notes (Signed)
Assessment/ Plan:   1. ARF: Had good UOP overnight with 850cc urine; s/p new PC Will watch for clinical improvement and hold on dialysis again.  There is good UOP. 2. HIV- per ID 3. HTN- blood pressure improved   Subjective: Interval History: No uremic symptoms  Objective: Vital signs in last 24 hours: Temp:  [97.5 F (36.4 C)-98.4 F (36.9 C)] 97.5 F (36.4 C) (09/02 1051) Pulse Rate:  [60-81] 81 (09/02 1051) Resp:  [18] 18 (09/02 1051) BP: (102-130)/(52-60) 106/52 mmHg (09/02 1051) SpO2:  [98 %-100 %] 100 % (09/02 1051) Weight:  [47.701 kg (105 lb 2.6 oz)] 47.701 kg (105 lb 2.6 oz) (09/01 2119) Weight change: -0.598 kg (-1 lb 5.1 oz)  Intake/Output from previous day: 09/01 0701 - 09/02 0700 In: 840 [P.O.:840] Out: 600 [Urine:600] Intake/Output this shift: Total I/O In: 240 [P.O.:240] Out: 375 [Urine:375]  General appearance: alert and cooperative Back: negative Resp: clear to auscultation bilaterally Chest wall: no tenderness, PC right chest Cardio: regular rate and rhythm, S1, S2 normal, no murmur, click, rub or gallop Extremities: extremities normal, atraumatic, no cyanosis or edema  Lab Results:  Recent Labs  04/03/14 1016 04/05/14 0345  WBC 5.7 4.6  HGB 8.6* 7.6*  HCT 24.4* 21.6*  PLT 136* 136*   BMET:  Recent Labs  04/04/14 0939 04/05/14 0345  NA 139 138  K 4.3 3.8  CL 97 98  CO2 25 24  GLUCOSE 261* 147*  BUN 37* 56*  CREATININE 4.08* 5.08*  CALCIUM 8.8 8.2*   No results found for this basename: PTH,  in the last 72 hours Iron Studies: No results found for this basename: IRON, TIBC, TRANSFERRIN, FERRITIN,  in the last 72 hours Studies/Results: Ir Fluoro Guide Cv Line Right  04/03/2014   CLINICAL DATA:  Renal failure.  EXAM: TUNNELED CENTRAL VENOUS HEMODIALYSIS CATHETER PLACEMENT WITH ULTRASOUND AND FLUOROSCOPIC GUIDANCE  ANESTHESIA/SEDATION: 0.5 mg IV Versed; 25 mcg IV Fentanyl.  Total Moderate Sedation Time  45 minutes.  MEDICATIONS: 2 g IV  Ancef. As antibiotic prophylaxis, Ancef was ordered pre-procedure and administered intravenously within one hour of incision.  FLUOROSCOPY TIME:  1 min and 36 seconds.  PROCEDURE: The procedure, risks, benefits, and alternatives were explained to the patient. Questions regarding the procedure were encouraged and answered. The patient understands and consents to the procedure.  The right neck and chest were prepped with chlorhexidine in a sterile fashion, and a sterile drape was applied covering the operative field. Maximum barrier sterile technique with sterile gowns and gloves were used for the procedure. Local anesthesia was provided with 1% lidocaine.  Ultrasound was used to confirm patency of the right internal jugular vein. After creating a small venotomy incision, a 21 gauge needle was advanced into the right internal jugular vein under direct, real-time ultrasound guidance. Ultrasound image documentation was performed. After securing guidewire access, an 8 Fr dilator was placed. A J-wire was kinked to measure appropriate catheter length.  A Bard Hemosplit tunneled hemodialysis catheter measuring 23 cm from tip to cuff was chosen for placement. This was tunneled in a retrograde fashion from the chest wall to the venotomy incision.  At the venotomy, serial dilatation was performed and a 16 Fr peel-away sheath was placed over a guidewire. The catheter was then placed through the sheath and the sheath removed. Final catheter positioning was confirmed and documented with a fluoroscopic spot image. The catheter was aspirated, flushed with saline, and injected with appropriate volume heparin dwells.  The venotomy  incision was closed with subcutaneous 3-0 Monocryl and subcuticular 4-0 Vicryl. Dermabond was applied to the incision. The catheter exit site was secured with 0-Prolene retention sutures.  COMPLICATIONS: None.  No pneumothorax.  FINDINGS: After catheter placement, the tips lie in the right atrium. The  catheter aspirates normally and is ready for immediate use.  IMPRESSION: Placement of tunneled hemodialysis catheter via the right internal jugular vein. The catheter tips lie in the right atrium. The catheter is ready for immediate use.   Electronically Signed   By: Aletta Edouard M.D.   On: 04/03/2014 14:14   Ir US Guide Vasc Access Right  04/03/2014   CLINICAL DATA:  Renal failure.  EXAM: TUNNELED CENTRAL VENOUS HEMODIALYSIS CATHETER PLACEMENT WITH ULTRASOUND AND FLUOROSCOPIC GUIDANCE  ANESTHESIA/SEDATION: 0.5 mg IV Versed; 25 mcg IV Fentanyl.  Total Moderate Sedation Time  45 minutes.  MEDICATIONS: 2 g IV Ancef. As antibiotic prophylaxis, Ancef was ordered pre-procedure and administered intravenously within one hour of incision.  FLUOROSCOPY TIME:  1 min and 36 seconds.  PROCEDURE: The procedure, risks, benefits, and alternatives were explained to the patient. Questions regarding the procedure were encouraged and answered. The patient understands and consents to the procedure.  The right neck and chest were prepped with chlorhexidine in a sterile fashion, and a sterile drape was applied covering the operative field. Maximum barrier sterile technique with sterile gowns and gloves were used for the procedure. Local anesthesia was provided with 1% lidocaine.  Ultrasound was used to confirm patency of the right internal jugular vein. After creating a small venotomy incision, a 21 gauge needle was advanced into the right internal jugular vein under direct, real-time ultrasound guidance. Ultrasound image documentation was performed. After securing guidewire access, an 8 Fr dilator was placed. A J-wire was kinked to measure appropriate catheter length.  A Bard Hemosplit tunneled hemodialysis catheter measuring 23 cm from tip to cuff was chosen for placement. This was tunneled in a retrograde fashion from the chest wall to the venotomy incision.  At the venotomy, serial dilatation was performed and a 16 Fr peel-away  sheath was placed over a guidewire. The catheter was then placed through the sheath and the sheath removed. Final catheter positioning was confirmed and documented with a fluoroscopic spot image. The catheter was aspirated, flushed with saline, and injected with appropriate volume heparin dwells.  The venotomy incision was closed with subcutaneous 3-0 Monocryl and subcuticular 4-0 Vicryl. Dermabond was applied to the incision. The catheter exit site was secured with 0-Prolene retention sutures.  COMPLICATIONS: None.  No pneumothorax.  FINDINGS: After catheter placement, the tips lie in the right atrium. The catheter aspirates normally and is ready for immediate use.  IMPRESSION: Placement of tunneled hemodialysis catheter via the right internal jugular vein. The catheter tips lie in the right atrium. The catheter is ready for immediate use.   Electronically Signed   By: Aletta Edouard M.D.   On: 04/03/2014 14:14   Scheduled: . amLODipine  10 mg Oral QHS  . antiseptic oral rinse  7 mL Mouth Rinse q12n4p  . atenolol  50 mg Oral Daily  . chlorhexidine  15 mL Mouth Rinse BID  . doxazosin  1 mg Oral QHS  . insulin aspart  0-15 Units Subcutaneous TID WC  . insulin aspart  0-5 Units Subcutaneous QHS  . insulin aspart  3 Units Subcutaneous TID WC  . pantoprazole  40 mg Oral QHS  . sodium bicarbonate  650 mg Oral TID  LOS: 14 days   Masai Kidd C 04/05/2014,12:10 PM

## 2014-04-05 NOTE — Progress Notes (Signed)
INFECTIOUS DISEASE PROGRESS NOTE  ID: [name] is a 68 y.o. male with  Active Problems:   AKI (acute kidney injury)   Metabolic acidosis   Hyperkalemia   Hyponatremia   HIV disease   Acute gastroenteritis   Protein-calorie malnutrition, severe   Acute kidney injury  Subjective: Without complaints  Abtx:  Anti-infectives   Start     Dose/Rate Route Frequency Ordered Stop   04/03/14 1300  ceFAZolin (ANCEF) IVPB 2 g/50 mL premix     2 g 100 mL/hr over 30 Minutes Intravenous  Once 04/02/14 1104 04/03/14 1330   04/03/14 1300  ceFAZolin (ANCEF) powder       As needed 04/03/14 1306 04/03/14 1411   04/03/14 1250  ceFAZolin (ANCEF) 2-3 GM-% IVPB SOLR    Comments:  [name], [name]   : cabinet override      04/03/14 1250 04/03/14 1327   03/25/14 1130  vancomycin (VANCOCIN) IVPB 750 mg/150 ml premix  Status:  Discontinued     750 mg 150 mL/hr over 60 Minutes Intravenous Every 48 hours 03/23/14 1038 03/24/14 1237   03/23/14 1130  vancomycin (VANCOCIN) IVPB 1000 mg/200 mL premix     1,000 mg 200 mL/hr over 60 Minutes Intravenous  Once 03/23/14 1038 03/23/14 1500   03/23/14 1045  imipenem-cilastatin (PRIMAXIN) 250 mg in sodium chloride 0.9 % 100 mL IVPB  Status:  Discontinued     250 mg 200 mL/hr over 30 Minutes Intravenous Every 12 hours 03/23/14 1038 03/24/14 1237      Medications:  Scheduled: . sodium chloride   Intravenous Once  . amLODipine  10 mg Oral QHS  . antiseptic oral rinse  7 mL Mouth Rinse q12n4p  . atenolol  50 mg Oral Daily  . chlorhexidine  15 mL Mouth Rinse BID  . doxazosin  1 mg Oral QHS  . insulin aspart  0-15 Units Subcutaneous TID WC  . insulin aspart  0-5 Units Subcutaneous QHS  . insulin aspart  3 Units Subcutaneous TID WC  . pantoprazole  40 mg Oral QHS    Objective: Vital signs in last 24 hours: Temp:  [97.5 F (36.4 C)-98.4 F (36.9 C)] 97.5 F (36.4 C) (09/02 1051) Pulse Rate:  [60-81] 81 (09/02 1051) Resp:  [18] 18 (09/02 1051) BP:  (102-130)/(52-60) 106/52 mmHg (09/02 1051) SpO2:  [98 %-100 %] 100 % (09/02 1051) Weight:  [47.701 kg (105 lb 2.6 oz)] 47.701 kg (105 lb 2.6 oz) (09/01 2119)   General appearance: alert, cooperative and no distress Resp: clear to auscultation bilaterally Cardio: regular rate and rhythm GI: normal findings: bowel sounds normal and soft, non-tender  Lab Results  Recent Labs  04/03/14 1016 04/04/14 0939 04/05/14 0345  WBC 5.7  --  4.6  HGB 8.6*  --  7.6*  HCT 24.4*  --  21.6*  NA 137 139 138  K 5.1 4.3 3.8  CL 99 97 98  CO2 17* 25 24  BUN 122* 37* 56*  CREATININE 9.06* 4.08* 5.08*   Liver Panel No results found for this basename: PROT, ALBUMIN, AST, ALT, ALKPHOS, BILITOT, BILIDIR, IBILI,  in the last 72 hours Sedimentation Rate No results found for this basename: ESRSEDRATE,  in the last 72 hours C-Reactive Protein No results found for this basename: CRP,  in the last 72 hours  Microbiology: Recent Results (from the past 240 hour(s))  SURGICAL PCR SCREEN     Status: None   Collection Time    04/02/14  9:30 PM      Result Value Ref Range Status   MRSA, PCR NEGATIVE  NEGATIVE Final   Staphylococcus aureus NEGATIVE  NEGATIVE Final   Comment:            The Xpert SA Assay (FDA     approved for NASAL specimens     in patients over 12 years of age),     is one component of     a comprehensive surveillance     program.  Test performance has     been validated by [name] for patients greater     than or equal to 77 year old.     It is not intended     to diagnose infection nor to     guide or monitor treatment.    Studies/Results: No results found.   Assessment/Plan: HIV+  ARF (tunneled HD catheter placed 8-31)  Pancreatitis Anemia  Total days of antibiotics: none, off ART   UOP improved overnight Transfused today, HgB 7.6.  He is previously HLA (-). Could consider change to triumeq at f/u. Will see him in ID clinic in f/u        [name] (pager) [phone] [name] 04/05/2014, 5:16 PM  LOS: 14 days

## 2014-04-05 NOTE — Evaluation (Signed)
Physical Therapy Evaluation Patient Details Name: [name] MRN: [id number] DOB: 1946/05/14 Today's Date: 04/05/2014   History of Present Illness  Pt admitted with acute kidney injury requiring dialysis, metabolic acidosis, hyperkalemia, hyponatremia, pancreatitis, and severe protein malnutrition.  Pt is HIV+ and has a hx of HTN.  Clinical Impression  Pt mobilizing independently without safety concerns. Has had left hip pain due to OA past several years that causes mild antalgia. Given HEP with hip stretching and postural stretches. Encouraged pt to continue to ambulate in hall 3-5x/ day. No further acute or f/u concerns.    Follow Up Recommendations No PT follow up    Equipment Recommendations  None recommended by PT    Recommendations for Other Services       Precautions / Restrictions Precautions Precautions: None Restrictions Weight Bearing Restrictions: No      Mobility  Bed Mobility Overal bed mobility: Independent             General bed mobility comments: pt easily gets in and out of bed  Transfers Overall transfer level: Independent Equipment used: None             General transfer comment: no difficulties or safety issues  Ambulation/Gait Ambulation/Gait assistance: Modified independent (Device/Increase time) Ambulation Distance (Feet): 400 Feet Assistive device: None Gait Pattern/deviations: Antalgic Gait velocity: slightly decreased   General Gait Details: pt reported that he was slightly stiff from being in hospital and speed slightly decreased but no LOB or safety concerns. Left hip soreness apparent during ambulation and pt given stretches to perform at home  Stairs            Wheelchair Mobility    Modified Rankin (Stroke Patients Only)       Balance Overall balance assessment: Modified Independent                                           Pertinent Vitals/Pain Pain Assessment: No/denies pain    Home  Living Family/patient expects to be discharged to:: Private residence Living Arrangements: Alone Available Help at Discharge: Friend(s);Family;Available PRN/intermittently Type of Home: Apartment Home Access: Level entry     Home Layout: One level Home Equipment: Grab bars - tub/shower Additional Comments: Pt's apartment is handicap accessible.    Prior Function Level of Independence: Independent         Comments: drives     Hand Dominance   Dominant Hand: Right    Extremity/Trunk Assessment   Upper Extremity Assessment: Defer to OT evaluation           Lower Extremity Assessment: LLE deficits/detail   LLE Deficits / Details: has had left hip discomfort last few years, with slight antalgia during gait and stiffness when he doesn't mobilize regularly, tight internal rotators and hip flexor  Cervical / Trunk Assessment: Kyphotic  Communication   Communication: No difficulties  Cognition Arousal/Alertness: Awake/alert Behavior During Therapy: WFL for tasks assessed/performed Overall Cognitive Status: Within Functional Limits for tasks assessed                      General Comments      Exercises Other Exercises Other Exercises: chest stretch supine on bed x 1 min Other Exercises: left hip stretches: internal and external rotators, extensors, and flexors      Assessment/Plan    PT Assessment Patent does not need any  further PT services  PT Diagnosis     PT Problem List    PT Treatment Interventions     PT Goals (Current goals can be found in the Care Plan section) Acute Rehab PT Goals Patient Stated Goal: go home PT Goal Formulation: No goals set, d/c therapy    Frequency     Barriers to discharge        Co-evaluation               End of Session   Activity Tolerance: Patient tolerated treatment well Patient left: in bed;with call bell/phone within reach Nurse Communication: Mobility status         Time: 3953-2023 PT Time  Calculation (min): 27 min   Charges:   PT Evaluation $Initial PT Evaluation Tier I: 1 Procedure PT Treatments $Gait Training: 8-22 mins $Therapeutic Activity: 8-22 mins   PT G Codes:         Leighton Roach, PT  Acute Rehab Services  [place], [place] 04/05/2014, 2:00 PM

## 2014-04-05 NOTE — Progress Notes (Signed)
[facility] PROGRESS NOTE  [name] XAJ:[id number] DOB: 02-21-1946 DOA: 03/22/2014 PCP: Penni Homans, MD  Brief HPI: 68 year old male with HIV on STRIBILD presented to [facility] c/o emesis, diarrhea, and generalized weakness x 3 days. In ED found to be in renal failure with elevated lipase. Transferred to [facility] for ICU admission. Patient subsequently transfer to [facility]. Patient admitted with respiratory failure, renal failure, and acute pancreatitis. He required intermittent dialysis. His respiratory failure has improved after volume removed with dialysis. Pancreatitis is improved. Renal function was getting worse. He was started on dialysis on 8/31 after a HD catheter was placed by IR.  Assessment/Plan: Acute renal failure, oliguric , High AG metabolic acidosis (suspect lactic, uremic) He was dialysed 8/31. Patient making urine. Plan is to watch him again off dialysis. Initially thought to be secondary to volume depletion, diuretics, SIRS. Had dialysis 8/21, 8/22, 8/25. And then he was being monitored closely. But his creatinine continued to rise and so ws started on HD as mentioned earlier. Continue Sod bicarb. Further management per Nephrology. Femoral dialysis catheter was removed 8/28.  Creatinine worsened today, but continues to have good urine out put, will continue to monitor.  Acute pancreatitis He is much improved. This was possibly due to STRIBILD (hx of this with other ARV meds). Abd [place] > no obvious pancreatic abnormality, similar dilated CBD compared with 4/15. HIV medications can cause elevation of amylase and lipase. MRI, MRCP as below. GI has signed off.  Acute on chronic respiratory failure  Resolved. Likely due to uncompensated met acidosis pulm edema? Vs pancreatitis induced ALI. Repeated chest x ray: 8-24: Persistent asymmetric airspace process, likely bilateral infiltrates. No need for antibiotics per ID.   DM2 uncontrolled CBG's increasing. Was on Metformin at  home which cannot be restarted due to renal failure. Improved some with meal coverage. May need to utilize Lantus depending on CBG's. A1c was 7.5 in July. Monitor for now.  Chronic Diarrhea Ongoing for last 6 months. C diff was negative.   Thrombocytopenia Improved.   HIV Holding HIV medications as they could have caused pancreatitis. ID following and will resume meds after discharge.   COPD without evidence of exacerbation.  Stable  Hypertension BP better. Will not be too aggressive as he is now on dialysis. Increased Amlodipine dose 8/26. Continue atenolol. May need to taper down meds if BP starts dropping.  Acute encephalopathy in setting of acidosis Resolved  Hyperkalemia  Resolved\  Anemia  due to CKD, will transfuse 1 unit PRBC as feeling fatigued.  DVT Prophylaxis: SCD's Code Status: Full Code.  Family Communication: Discussed with patient.  Disposition Plan: Unclear for now. Per Nephrology.    Consultants:  Nephrology  ID  GI.   Procedures: 8/20 [place] abdo>>>Visualized pancreas has a normal sonographic appearance. No biliary sludge or gallstones. Upper limits of normal to mildly dilated CBD, the similar to the abdomen ultrasound in April. No intrahepatic biliary ductal dilatation identified   8/21 TTE > Mild LVH, EF 60%, PAP 75mHg  8/31: Tunneled HD catheter placed by IR  Antibiotics:  none  HPI/Subjective: He feels well. No complaints. Ambulating. Frustrated at having to stay here for some more time.complains of fatigue.  Objective: Filed Vitals:   04/05/14 1051  BP: 106/52  Pulse: 81  Temp: 97.5 F (36.4 C)  Resp: 18    Intake/Output Summary (Last 24 hours) at 04/05/14 1320 Last data filed at 04/05/14 0940  Gross per 24 hour  Intake  480 ml  Output    975 ml  Net   -495 ml   Filed Weights   04/03/14 1841 04/03/14 2034 04/04/14 2119  Weight: 47.7 kg (105 lb 2.6 oz) 47.701 kg (105 lb 2.6 oz) 47.701 kg (105 lb 2.6 oz)     Exam:   General:  Alert in no distress.   Cardiovascular: S 1, S 2 RRR  Respiratory: CTA bilaterally. No crackles  Abdomen: BS present, soft, NT  Musculoskeletal: no edema.   Data Reviewed: Basic Metabolic Panel:  Recent Labs Lab 03/30/14 0849 03/31/14 0510 04/01/14 0438 04/02/14 0521 04/03/14 1016 04/04/14 0939 04/05/14 0345  NA 141 137 137 136* 137 139 138  K 4.2 4.4 4.4 4.6 5.1 4.3 3.8  CL 100 99 100 97 99 97 98  CO2 17* 20 19 16* 17* 25 24  GLUCOSE 126* 156* 149* 154* 144* 261* 147*  BUN 65* 82* 97* 107* 122* 37* 56*  CREATININE 6.08* 6.90* 7.63* 8.13* 9.06* 4.08* 5.08*  CALCIUM 8.5 8.4 8.2* 8.6 9.1 8.8 8.2*  MG 2.5 2.5 2.3 2.2  --   --   --   PHOS 5.8* 5.4* 6.2* 6.4*  --   --   --    Liver Function Tests:  Recent Labs Lab 03/30/14 0849 03/31/14 0510 04/01/14 0438 04/02/14 0521  ALBUMIN 2.4* 2.5* 2.3* 2.4*    Recent Labs Lab 03/30/14 0849 04/01/14 0438  LIPASE 740* 827*   CBC:  Recent Labs Lab 03/30/14 0849 04/03/14 1016 04/05/14 0345  WBC 7.9 5.7 4.6  HGB 9.4* 8.6* 7.6*  HCT 27.5* 24.4* 21.6*  MCV 97.2 94.9 97.3  PLT 100* 136* 136*   CBG:  Recent Labs Lab 04/04/14 0745 04/04/14 1134 04/04/14 1637 04/04/14 2117 04/05/14 0822  GLUCAP 119* 278* 212* 147* 143*    Recent Results (from the past 240 hour(s))  SURGICAL PCR SCREEN     Status: None   Collection Time    04/02/14  9:30 PM      Result Value Ref Range Status   MRSA, PCR NEGATIVE  NEGATIVE Final   Staphylococcus aureus NEGATIVE  NEGATIVE Final   Comment:            The Xpert SA Assay (FDA     approved for NASAL specimens     in patients over 79 years of age),     is one component of     a comprehensive surveillance     program.  Test performance has     been validated by [employer] for patients greater     than or equal to 41 year old.     It is not intended     to diagnose infection nor to     guide or monitor treatment.     Studies: Ir Fluoro  Guide Cv Line Right  04/03/2014   CLINICAL DATA:  Renal failure.  EXAM: TUNNELED CENTRAL VENOUS HEMODIALYSIS CATHETER PLACEMENT WITH ULTRASOUND AND FLUOROSCOPIC GUIDANCE  ANESTHESIA/SEDATION: 0.5 mg IV Versed; 25 mcg IV Fentanyl.  Total Moderate Sedation Time  45 minutes.  MEDICATIONS: 2 g IV Ancef. As antibiotic prophylaxis, Ancef was ordered pre-procedure and administered intravenously within one hour of incision.  FLUOROSCOPY TIME:  1 min and 36 seconds.  PROCEDURE: The procedure, risks, benefits, and alternatives were explained to the patient. Questions regarding the procedure were encouraged and answered. The patient understands and consents to the procedure.  The right neck and chest were prepped  with chlorhexidine in a sterile fashion, and a sterile drape was applied covering the operative field. Maximum barrier sterile technique with sterile gowns and gloves were used for the procedure. Local anesthesia was provided with 1% lidocaine.  Ultrasound was used to confirm patency of the right internal jugular vein. After creating a small venotomy incision, a 21 gauge needle was advanced into the right internal jugular vein under direct, real-time ultrasound guidance. Ultrasound image documentation was performed. After securing guidewire access, an 8 Fr dilator was placed. A J-wire was kinked to measure appropriate catheter length.  A Bard Hemosplit tunneled hemodialysis catheter measuring 23 cm from tip to cuff was chosen for placement. This was tunneled in a retrograde fashion from the chest wall to the venotomy incision.  At the venotomy, serial dilatation was performed and a 16 Fr peel-away sheath was placed over a guidewire. The catheter was then placed through the sheath and the sheath removed. Final catheter positioning was confirmed and documented with a fluoroscopic spot image. The catheter was aspirated, flushed with saline, and injected with appropriate volume heparin dwells.  The venotomy incision was  closed with subcutaneous 3-0 Monocryl and subcuticular 4-0 Vicryl. Dermabond was applied to the incision. The catheter exit site was secured with 0-Prolene retention sutures.  COMPLICATIONS: None.  No pneumothorax.  FINDINGS: After catheter placement, the tips lie in the right atrium. The catheter aspirates normally and is ready for immediate use.  IMPRESSION: Placement of tunneled hemodialysis catheter via the right internal jugular vein. The catheter tips lie in the right atrium. The catheter is ready for immediate use.   Electronically Signed   By: Aletta Edouard M.D.   On: 04/03/2014 14:14   Ir US Guide Vasc Access Right  04/03/2014   CLINICAL DATA:  Renal failure.  EXAM: TUNNELED CENTRAL VENOUS HEMODIALYSIS CATHETER PLACEMENT WITH ULTRASOUND AND FLUOROSCOPIC GUIDANCE  ANESTHESIA/SEDATION: 0.5 mg IV Versed; 25 mcg IV Fentanyl.  Total Moderate Sedation Time  45 minutes.  MEDICATIONS: 2 g IV Ancef. As antibiotic prophylaxis, Ancef was ordered pre-procedure and administered intravenously within one hour of incision.  FLUOROSCOPY TIME:  1 min and 36 seconds.  PROCEDURE: The procedure, risks, benefits, and alternatives were explained to the patient. Questions regarding the procedure were encouraged and answered. The patient understands and consents to the procedure.  The right neck and chest were prepped with chlorhexidine in a sterile fashion, and a sterile drape was applied covering the operative field. Maximum barrier sterile technique with sterile gowns and gloves were used for the procedure. Local anesthesia was provided with 1% lidocaine.  Ultrasound was used to confirm patency of the right internal jugular vein. After creating a small venotomy incision, a 21 gauge needle was advanced into the right internal jugular vein under direct, real-time ultrasound guidance. Ultrasound image documentation was performed. After securing guidewire access, an 8 Fr dilator was placed. A J-wire was kinked to measure  appropriate catheter length.  A Bard Hemosplit tunneled hemodialysis catheter measuring 23 cm from tip to cuff was chosen for placement. This was tunneled in a retrograde fashion from the chest wall to the venotomy incision.  At the venotomy, serial dilatation was performed and a 16 Fr peel-away sheath was placed over a guidewire. The catheter was then placed through the sheath and the sheath removed. Final catheter positioning was confirmed and documented with a fluoroscopic spot image. The catheter was aspirated, flushed with saline, and injected with appropriate volume heparin dwells.  The venotomy incision was closed with  subcutaneous 3-0 Monocryl and subcuticular 4-0 Vicryl. Dermabond was applied to the incision. The catheter exit site was secured with 0-Prolene retention sutures.  COMPLICATIONS: None.  No pneumothorax.  FINDINGS: After catheter placement, the tips lie in the right atrium. The catheter aspirates normally and is ready for immediate use.  IMPRESSION: Placement of tunneled hemodialysis catheter via the right internal jugular vein. The catheter tips lie in the right atrium. The catheter is ready for immediate use.   Electronically Signed   By: Aletta Edouard M.D.   On: 04/03/2014 14:14    Scheduled Meds: . sodium chloride   Intravenous Once  . amLODipine  10 mg Oral QHS  . antiseptic oral rinse  7 mL Mouth Rinse q12n4p  . atenolol  50 mg Oral Daily  . chlorhexidine  15 mL Mouth Rinse BID  . doxazosin  1 mg Oral QHS  . insulin aspart  0-15 Units Subcutaneous TID WC  . insulin aspart  0-5 Units Subcutaneous QHS  . insulin aspart  3 Units Subcutaneous TID WC  . pantoprazole  40 mg Oral QHS   Continuous Infusions: . sodium chloride Stopped (03/26/14 1330)    Active Problems:   AKI (acute kidney injury)   Metabolic acidosis   Hyperkalemia   Hyponatremia   HIV disease   Acute gastroenteritis   Protein-calorie malnutrition, severe   Acute kidney injury   Time spent: 30  minutes.    [facility], [place]  [facility] Pager [phone]  If 7PM-7AM, please contact night-coverage at [email or web address], password [facility] 04/05/2014, 1:20 PM  LOS: 14 days

## 2014-04-06 LAB — RENAL FUNCTION PANEL
Albumin: 2.4 g/dL — ABNORMAL LOW (ref 3.5–5.2)
Anion gap: 18 — ABNORMAL HIGH (ref 5–15)
BUN: 72 mg/dL — ABNORMAL HIGH (ref 6–23)
CO2: 20 meq/L (ref 19–32)
Calcium: 8.3 mg/dL — ABNORMAL LOW (ref 8.4–10.5)
Chloride: 102 mEq/L (ref 96–112)
Creatinine, Ser: 6.53 mg/dL — ABNORMAL HIGH (ref 0.50–1.35)
GFR, EST AFRICAN AMERICAN: 9 mL/min — AB (ref >90)
GFR, EST NON AFRICAN AMERICAN: 8 mL/min — AB (ref >90)
GLUCOSE: 124 mg/dL — AB (ref 70–99)
Phosphorus: 5.8 mg/dL — ABNORMAL HIGH (ref 2.3–4.6)
Potassium: 4.2 mEq/L (ref 3.7–5.3)
SODIUM: 140 meq/L (ref 137–147)

## 2014-04-06 LAB — TYPE AND SCREEN
ABO/RH(D): O NEG
ANTIBODY SCREEN: NEGATIVE
Unit division: 0

## 2014-04-06 LAB — BASIC METABOLIC PANEL
Anion gap: 16 — ABNORMAL HIGH (ref 5–15)
BUN: 72 mg/dL — ABNORMAL HIGH (ref 6–23)
CALCIUM: 8.3 mg/dL — AB (ref 8.4–10.5)
CO2: 22 mEq/L (ref 19–32)
Chloride: 102 mEq/L (ref 96–112)
Creatinine, Ser: 6.5 mg/dL — ABNORMAL HIGH (ref 0.50–1.35)
GFR calc non Af Amer: 8 mL/min — ABNORMAL LOW (ref >90)
GFR, EST AFRICAN AMERICAN: 9 mL/min — AB (ref >90)
Glucose, Bld: 125 mg/dL — ABNORMAL HIGH (ref 70–99)
Potassium: 4.2 mEq/L (ref 3.7–5.3)
SODIUM: 140 meq/L (ref 137–147)

## 2014-04-06 LAB — CBC
HCT: 24.9 % — ABNORMAL LOW (ref 39.0–52.0)
Hemoglobin: 8.7 g/dL — ABNORMAL LOW (ref 13.0–17.0)
MCH: 32.1 pg (ref 26.0–34.0)
MCHC: 34.9 g/dL (ref 30.0–36.0)
MCV: 91.9 fL (ref 78.0–100.0)
PLATELETS: 136 10*3/uL — AB (ref 150–400)
RBC: 2.71 MIL/uL — ABNORMAL LOW (ref 4.22–5.81)
RDW: 15.3 % (ref 11.5–15.5)
WBC: 4.7 10*3/uL (ref 4.0–10.5)

## 2014-04-06 LAB — GLUCOSE, CAPILLARY
GLUCOSE-CAPILLARY: 183 mg/dL — AB (ref 70–99)
Glucose-Capillary: 141 mg/dL — ABNORMAL HIGH (ref 70–99)
Glucose-Capillary: 183 mg/dL — ABNORMAL HIGH (ref 70–99)
Glucose-Capillary: 167 mg/dL — ABNORMAL HIGH (ref 70–99)

## 2014-04-06 MED ORDER — SODIUM CHLORIDE 0.45 % IV SOLN
INTRAVENOUS | Status: DC
  Administered 2014-04-06 (×2): via INTRAVENOUS

## 2014-04-06 NOTE — Progress Notes (Signed)
INFECTIOUS DISEASE PROGRESS NOTE  ID: [name] is a 68 y.o. male with  Active Problems:   AKI (acute kidney injury)   Metabolic acidosis   Hyperkalemia   Hyponatremia   HIV disease   Acute gastroenteritis   Protein-calorie malnutrition, severe   Acute kidney injury  Subjective: Without complaints. Making good urine.   Abtx:  Anti-infectives   Start     Dose/Rate Route Frequency Ordered Stop   04/03/14 1300  ceFAZolin (ANCEF) IVPB 2 g/50 mL premix     2 g 100 mL/hr over 30 Minutes Intravenous  Once 04/02/14 1104 04/03/14 1330   04/03/14 1300  ceFAZolin (ANCEF) powder       As needed 04/03/14 1306 04/03/14 1411   04/03/14 1250  ceFAZolin (ANCEF) 2-3 GM-% IVPB SOLR    Comments:  [name], [name]   : cabinet override      04/03/14 1250 04/03/14 1327   03/25/14 1130  vancomycin (VANCOCIN) IVPB 750 mg/150 ml premix  Status:  Discontinued     750 mg 150 mL/hr over 60 Minutes Intravenous Every 48 hours 03/23/14 1038 03/24/14 1237   03/23/14 1130  vancomycin (VANCOCIN) IVPB 1000 mg/200 mL premix     1,000 mg 200 mL/hr over 60 Minutes Intravenous  Once 03/23/14 1038 03/23/14 1500   03/23/14 1045  imipenem-cilastatin (PRIMAXIN) 250 mg in sodium chloride 0.9 % 100 mL IVPB  Status:  Discontinued     250 mg 200 mL/hr over 30 Minutes Intravenous Every 12 hours 03/23/14 1038 03/24/14 1237      Medications:  Scheduled: . amLODipine  10 mg Oral QHS  . antiseptic oral rinse  7 mL Mouth Rinse q12n4p  . atenolol  50 mg Oral Daily  . chlorhexidine  15 mL Mouth Rinse BID  . doxazosin  1 mg Oral QHS  . insulin aspart  0-15 Units Subcutaneous TID WC  . insulin aspart  0-5 Units Subcutaneous QHS  . insulin aspart  3 Units Subcutaneous TID WC  . pantoprazole  40 mg Oral QHS    Objective: Vital signs in last 24 hours: Temp:  [97.5 F (36.4 C)-98.6 F (37 C)] 98.3 F (36.8 C) (09/03 1014) Pulse Rate:  [54-81] 54 (09/03 1014) Resp:  [18-20] 18 (09/03 1014) BP: (106-129)/(49-62)  125/62 mmHg (09/03 1014) SpO2:  [98 %-100 %] 100 % (09/03 1014) Weight:  [50.6 kg (111 lb 8.8 oz)] 50.6 kg (111 lb 8.8 oz) (09/03 0500)   no exam  Lab Results  Recent Labs  04/05/14 0345 04/06/14 0521  WBC 4.6 4.7  HGB 7.6* 8.7*  HCT 21.6* 24.9*  NA 138 140  K 3.8 4.2  CL 98 102  CO2 24 22  BUN 56* 72*  CREATININE 5.08* 6.50*   Liver Panel No results found for this basename: PROT, ALBUMIN, AST, ALT, ALKPHOS, BILITOT, BILIDIR, IBILI,  in the last 72 hours Sedimentation Rate No results found for this basename: ESRSEDRATE,  in the last 72 hours C-Reactive Protein No results found for this basename: CRP,  in the last 72 hours  Microbiology: Recent Results (from the past 240 hour(s))  SURGICAL PCR SCREEN     Status: None   Collection Time    04/02/14  9:30 PM      Result Value Ref Range Status   MRSA, PCR NEGATIVE  NEGATIVE Final   Staphylococcus aureus NEGATIVE  NEGATIVE Final   Comment:            The Xpert SA  Assay (FDA     approved for NASAL specimens     in patients over 6 years of age),     is one component of     a comprehensive surveillance     program.  Test performance has     been validated by [employer] for patients greater     than or equal to 103 year old.     It is not intended     to diagnose infection nor to     guide or monitor treatment.    Studies/Results: No results found.   Assessment/Plan: HIV+  ARF (tunneled HD catheter placed 8-31)  Pancreatitis  Anemia   Total days of antibiotics: none, off ART  HgB stable post transfusion.  He is previously HLA (-). Could consider change to triumeq at f/u.  Will see him in ID clinic in f/u           [place] (pager) [phone] [email or web address] 04/06/2014, 10:20 AM  LOS: 15 days

## 2014-04-06 NOTE — Progress Notes (Signed)
1. ARF: Had good UOP overnight with 850cc urine; s/p new PC Will watch for clinical improvement and hold on dialysis again. There is good UOP.  Will give IVF to keep up with urinary losses 2. HIV- per ID 3. HTN- blood pressure improved   Subjective: Interval History: Urinating well  Objective: Vital signs in last 24 hours: Temp:  [97.5 F (36.4 C)-98.6 F (37 C)] 98.3 F (36.8 C) (09/03 1014) Pulse Rate:  [54-81] 54 (09/03 1014) Resp:  [18-20] 18 (09/03 1014) BP: (106-129)/(49-62) 125/62 mmHg (09/03 1014) SpO2:  [98 %-100 %] 100 % (09/03 1014) Weight:  [50.6 kg (111 lb 8.8 oz)] 50.6 kg (111 lb 8.8 oz) (09/03 0500) Weight change: 2.898 kg (6 lb 6.2 oz)  Intake/Output from previous day: 09/02 0701 - 09/03 0700 In: 480 [P.O.:480] Out: 1725 [Urine:1725] Intake/Output this shift: Total I/O In: 360 [P.O.:360] Out: 375 [Urine:375]  alert and appropriateskin turgor Ok No edema  Lab Results:  Recent Labs  04/05/14 0345 04/06/14 0521  WBC 4.6 4.7  HGB 7.6* 8.7*  HCT 21.6* 24.9*  PLT 136* 136*   BMET:  Recent Labs  04/05/14 0345 04/06/14 0521  NA 138 140  K 3.8 4.2  CL 98 102  CO2 24 22  GLUCOSE 147* 125*  BUN 56* 72*  CREATININE 5.08* 6.50*  CALCIUM 8.2* 8.3*   No results found for this basename: PTH,  in the last 72 hours Iron Studies: No results found for this basename: IRON, TIBC, TRANSFERRIN, FERRITIN,  in the last 72 hours Studies/Results: No results found.  Scheduled: . amLODipine  10 mg Oral QHS  . antiseptic oral rinse  7 mL Mouth Rinse q12n4p  . atenolol  50 mg Oral Daily  . chlorhexidine  15 mL Mouth Rinse BID  . doxazosin  1 mg Oral QHS  . insulin aspart  0-15 Units Subcutaneous TID WC  . insulin aspart  0-5 Units Subcutaneous QHS  . insulin aspart  3 Units Subcutaneous TID WC  . pantoprazole  40 mg Oral QHS     LOS: 15 days   Jerod Mcquain C 04/06/2014,10:39 AM

## 2014-04-06 NOTE — Progress Notes (Signed)
[facility] PROGRESS NOTE  JAC ROMULUS RCV:[id number] DOB: 25-Dec-1945 DOA: 03/22/2014 PCP: Penni Homans, MD  Brief HPI: 68 year old male with HIV on STRIBILD presented to [place] c/o emesis, diarrhea, and generalized weakness x 3 days. In ED found to be in renal failure with elevated lipase. Transferred to [facility] for ICU admission. [facility] subsequently [facility]. Patient admitted with respiratory failure, renal failure, and acute pancreatitis. He required intermittent dialysis. His respiratory failure has improved after volume removed with dialysis. Pancreatitis is improved. Renal function was getting worse. He was started on dialysis on 8/31 after a HD catheter was placed by IR.  Assessment/Plan: Acute renal failure, initially oliguric, currently nonoliguric. He was dialysed 8/31. Patient making urine. Plan is to watch him again off dialysis. Initially thought to be secondary to volume depletion, diuretics, SIRS. Had dialysis 8/21, 8/22, 8/25. And then he was being monitored closely. But his creatinine continued to rise and so ws started on HD as mentioned earlier. Continue Sod bicarb. Further management per Nephrology. Femoral dialysis catheter was removed 8/28. Currently has [place] cath, Creatinine continues to worsen, but continues to have good urine out put, will continue to monitor as per nephrology, will continue with aggressive IV fluids resuscitation to replace his urine output.  Acute pancreatitis He is much improved. This was possibly due to STRIBILD (hx of this with other ARV meds). Abd [place] > no obvious pancreatic abnormality, similar dilated CBD compared with 4/15. HIV medications can cause elevation of amylase and lipase. MRI, MRCP as below. GI has signed off, denies any abdominal pain.  Acute on chronic respiratory failure  Resolved. Likely due to uncompensated met acidosis pulm edema? Vs pancreatitis induced ALI. Repeated chest x ray: 8-24: Persistent asymmetric  airspace process, likely bilateral infiltrates. No need for antibiotics per ID.   DM2  CBG's increasing. Was on Metformin at home which cannot be restarted due to renal failure. Improved some with meal coverage. May need to utilize Lantus depending on CBG's. A1c was 7.5 in July. Monitor for now. Continue with sliding scale.  Chronic Diarrhea Ongoing for last 6 months. C diff was negative.   Thrombocytopenia Improved.   HIV Holding HIV medications as they could have caused pancreatitis. ID following , patient will follow with them after discharge.   COPD without evidence of exacerbation.  Stable  Hypertension BP better. Will not be too aggressive as he is now on dialysis. Increased Amlodipine dose 8/26. Continue atenolol. May need to taper down meds if BP starts dropping.  Acute encephalopathy in setting of acidosis Resolved  Hyperkalemia  Resolved_0  Anemia  due to CKD,  transfused 1 unit PRBC 9/2, may need Procrit.  DVT Prophylaxis: SCD's Code Status: Full Code.  Family Communication: Discussed with patient.  Disposition Plan: Unclear for now. Per Nephrology.    Consultants:  Nephrology  ID  GI.   Procedures: 8/20 [place] abdo>>>Visualized pancreas has a normal sonographic appearance. No biliary sludge or gallstones. Upper limits of normal to mildly dilated CBD, the similar to the abdomen ultrasound in April. No intrahepatic biliary ductal dilatation identified   8/21 TTE > Mild LVH, EF 60%, PAP 30mHg  8/31: Tunneled HD catheter placed by IR 9/2: Transfused one unit packed red blood cell. Antibiotics:  none  HPI/Subjective: He feels well. No complaints. Ambulating. Frustrated at having to stay here for some more time.complains of fatigue.  Objective: Filed Vitals:   04/06/14 1014  BP: 125/62  Pulse: 54  Temp: 98.3  F (36.8 C)  Resp: 18    Intake/Output Summary (Last 24 hours) at 04/06/14 1703 Last data filed at 04/06/14 1240  Gross per 24 hour  Intake     600 ml  Output   2125 ml  Net  -1525 ml   Filed Weights   04/04/14 2119 04/05/14 2149 04/06/14 0500  Weight: 47.701 kg (105 lb 2.6 oz) 50.6 kg (111 lb 8.8 oz) 50.6 kg (111 lb 8.8 oz)    Exam:   General:  Alert in no distress.   Cardiovascular: S 1, S 2 RRR  Respiratory: CTA bilaterally. No crackles  Abdomen: BS present, soft, NT  Musculoskeletal: no edema.   Data Reviewed: Basic Metabolic Panel:  Recent Labs Lab 03/31/14 0510 04/01/14 0438 04/02/14 0521 04/03/14 1016 04/04/14 0939 04/05/14 0345 04/06/14 0521 04/06/14 0541  NA 137 137 136* 137 139 138 140 140  K 4.4 4.4 4.6 5.1 4.3 3.8 4.2 4.2  CL 99 100 97 99 97 98 102 102  CO2 20 19 16* 17* _0 GLUCOSE 156* 149* 154* 144* 261* 147* 125* 124*  BUN 82* 97* 107* 122* 37* 56* 72* 72*  CREATININE 6.90* 7.63* 8.13* 9.06* 4.08* 5.08* 6.50* 6.53*  CALCIUM 8.4 8.2* 8.6 9.1 8.8 8.2* 8.3* 8.3*  MG 2.5 2.3 2.2  --   --   --   --   --   PHOS 5.4* 6.2* 6.4*  --   --   --   --  5.8*   Liver Function Tests:  Recent Labs Lab 03/31/14 0510 04/01/14 0438 04/02/14 0521 04/06/14 0541  ALBUMIN 2.5* 2.3* 2.4* 2.4*    Recent Labs Lab 04/01/14 0438  LIPASE 827*   CBC:  Recent Labs Lab 04/03/14 1016 04/05/14 0345 04/06/14 0521  WBC 5.7 4.6 4.7  HGB 8.6* 7.6* 8.7*  HCT 24.4* 21.6* 24.9*  MCV 94.9 97.3 91.9  PLT 136* 136* 136*   CBG:  Recent Labs Lab 04/05/14 1312 04/05/14 1705 04/05/14 2148 04/06/14 0738 04/06/14 1158  GLUCAP 270* 138* 121* 141* 183*    Recent Results (from the past 240 hour(s))  SURGICAL PCR SCREEN     Status: None   Collection Time    04/02/14  9:30 PM      Result Value Ref Range Status   MRSA, PCR NEGATIVE  NEGATIVE Final   Staphylococcus aureus NEGATIVE  NEGATIVE Final   Comment:            The Xpert SA Assay (FDA     approved for NASAL specimens     in patients over 50 years of age),     is one component of     a comprehensive surveillance     program.  Test  performance has     been validated by [employer] for patients greater     than or equal to 90 year old.     It is not intended     to diagnose infection nor to     guide or monitor treatment.     Studies: No results found.  Scheduled Meds: . amLODipine  10 mg Oral QHS  . antiseptic oral rinse  7 mL Mouth Rinse q12n4p  . atenolol  50 mg Oral Daily  . chlorhexidine  15 mL Mouth Rinse BID  . doxazosin  1 mg Oral QHS  . insulin aspart  0-15 Units Subcutaneous TID WC  . insulin aspart  0-5 Units Subcutaneous  QHS  . insulin aspart  3 Units Subcutaneous TID WC  . pantoprazole  40 mg Oral QHS   Continuous Infusions: . sodium chloride 125 mL/hr at 04/06/14 1235    Active Problems:   AKI (acute kidney injury)   Metabolic acidosis   Hyperkalemia   Hyponatremia   HIV disease   Acute gastroenteritis   Protein-calorie malnutrition, severe   Acute kidney injury   Time spent: 30 minutes.    [facility], [place]  [facility] Pager [phone]  If 7PM-7AM, please contact night-coverage at [email or web address], password [facility] 04/06/2014, 5:03 PM  LOS: 15 days

## 2014-04-07 LAB — RENAL FUNCTION PANEL
Albumin: 2.3 g/dL — ABNORMAL LOW (ref 3.5–5.2)
Anion gap: 17 — ABNORMAL HIGH (ref 5–15)
BUN: 79 mg/dL — AB (ref 6–23)
CALCIUM: 7.8 mg/dL — AB (ref 8.4–10.5)
CO2: 18 mEq/L — ABNORMAL LOW (ref 19–32)
Chloride: 101 mEq/L (ref 96–112)
Creatinine, Ser: 7.08 mg/dL — ABNORMAL HIGH (ref 0.50–1.35)
GFR calc Af Amer: 8 mL/min — ABNORMAL LOW (ref >90)
GFR, EST NON AFRICAN AMERICAN: 7 mL/min — AB (ref >90)
Glucose, Bld: 140 mg/dL — ABNORMAL HIGH (ref 70–99)
Phosphorus: 6.3 mg/dL — ABNORMAL HIGH (ref 2.3–4.6)
Potassium: 4.1 mEq/L (ref 3.7–5.3)
Sodium: 136 mEq/L — ABNORMAL LOW (ref 137–147)

## 2014-04-07 LAB — GLUCOSE, CAPILLARY
GLUCOSE-CAPILLARY: 138 mg/dL — AB (ref 70–99)
GLUCOSE-CAPILLARY: 160 mg/dL — AB (ref 70–99)
GLUCOSE-CAPILLARY: 164 mg/dL — AB (ref 70–99)
GLUCOSE-CAPILLARY: 193 mg/dL — AB (ref 70–99)

## 2014-04-07 MED ORDER — SODIUM BICARBONATE 8.4 % IV SOLN
INTRAVENOUS | Status: DC
  Administered 2014-04-07 – 2014-04-08 (×4): via INTRAVENOUS
  Filled 2014-04-07 (×6): qty 100

## 2014-04-07 NOTE — Progress Notes (Signed)
[facility] PROGRESS NOTE  [name] ZRA:[id number] DOB: 24-Mar-1946 DOA: 03/22/2014 PCP: Penni Homans, MD  Brief HPI: 68 year old male with HIV on STRIBILD presented to [place] c/o emesis, diarrhea, and generalized weakness x 3 days. In ED found to be in renal failure with elevated lipase. Transferred to [facility] for ICU admission. Patient subsequently [facility]. Patient admitted with respiratory failure, renal failure, and acute pancreatitis. He required intermittent dialysis. His respiratory failure has improved after volume removed with dialysis. Pancreatitis is improved. Renal function was getting worse. He was started on dialysis on 8/31 after a HD catheter was placed by IR.  Assessment/Plan: Acute renal failure, initially oliguric, currently nonoliguric. He was dialysed 8/31. Patient making urine. Plan is to watch him again off dialysis. Initially thought to be secondary to volume depletion, diuretics, SIRS. Had dialysis 8/21, 8/22, 8/25. And then he was being monitored closely. But his creatinine continued to rise and so ws started on HD as mentioned earlier. Continue Sod bicarb. Further management per Nephrology. Femoral dialysis catheter was removed 8/28. Currently has [place] cath, Creatinine continues to worsen, but continues to have good urine out put, will continue to monitor as per nephrology, will continue with aggressive IV fluids resuscitation to replace his urine output.remains negative balance for last 24 hours.  Acute pancreatitis He is much improved. This was possibly due to STRIBILD (hx of this with other ARV meds). Abd [place] > no obvious pancreatic abnormality, similar dilated CBD compared with 4/15. HIV medications can cause elevation of amylase and lipase. MRI, MRCP as below. GI has signed off, denies any abdominal pain.  Acute on chronic respiratory failure  Resolved. Likely due to uncompensated met acidosis pulm edema? Vs pancreatitis induced ALI. Repeated  chest x ray: 8-24: Persistent asymmetric airspace process, likely bilateral infiltrates. No need for antibiotics per ID.   DM2  CBG's increasing. Was on Metformin at home which cannot be restarted due to renal failure. Improved some with meal coverage. May need to utilize Lantus depending on CBG's. A1c was 7.5 in July. Monitor for now. Continue with sliding scale.  Chronic Diarrhea Ongoing for last 6 months. C diff was negative.   Thrombocytopenia Improved.   HIV Holding HIV medications as they could have caused pancreatitis. ID following , patient will follow with them after discharge.   COPD without evidence of exacerbation.  Stable  Hypertension BP better. Will not be too aggressive as he is now on dialysis. Increased Amlodipine dose 8/26. Continue atenolol. May need to taper down meds if BP starts dropping.  Acute encephalopathy in setting of acidosis Resolved  Hyperkalemia  Resolved  Anemia  due to CKD,  transfused 1 unit PRBC 9/2, may need Procrit.  DVT Prophylaxis: SCD's Code Status: Full Code.  Family Communication: Discussed with patient.  Disposition Plan: Unclear for now. Per Nephrology.    Consultants:  Nephrology  ID  GI.   Procedures: 8/20 [place] abdo>>>Visualized pancreas has a normal sonographic appearance. No biliary sludge or gallstones. Upper limits of normal to mildly dilated CBD, the similar to the abdomen ultrasound in April. No intrahepatic biliary ductal dilatation identified   8/21 TTE > Mild LVH, EF 60%, PAP 70mHg  8/31: Tunneled HD catheter placed by IR 9/2: Transfused one unit packed red blood cell. Antibiotics:  none  HPI/Subjective: He feels well. No complaints. Ambulating. Wants to go home.  Objective: Filed Vitals:   04/07/14 0457  BP: 130/63  Pulse: 60  Temp: 98.4 F (36.9  C)  Resp: 18    Intake/Output Summary (Last 24 hours) at 04/07/14 1040 Last data filed at 04/07/14 0500  Gross per 24 hour  Intake 880.42 ml   Output   1700 ml  Net -819.58 ml   Filed Weights   04/06/14 0500 04/06/14 2148 04/07/14 0500  Weight: 50.6 kg (111 lb 8.8 oz) 50.077 kg (110 lb 6.4 oz) 50.077 kg (110 lb 6.4 oz)    Exam:   General:  Alert in no distress.   Cardiovascular: S 1, S 2 RRR  Respiratory: CTA bilaterally. No crackles  Abdomen: BS present, soft, NT  Musculoskeletal: no edema.   Data Reviewed: Basic Metabolic Panel:  Recent Labs Lab 04/01/14 0438 04/02/14 0521  04/04/14 0939 04/05/14 0345 04/06/14 0521 04/06/14 0541 04/07/14 0448  NA 137 136*  < > 139 138 140 140 136*  K 4.4 4.6  < > 4.3 3.8 4.2 4.2 4.1  CL 100 97  < > 97 98 102 102 101  CO2 19 16*  < > '25 24 22 20 ' 18*  GLUCOSE 149* 154*  < > 261* 147* 125* 124* 140*  BUN 97* 107*  < > 37* 56* 72* 72* 79*  CREATININE 7.63* 8.13*  < > 4.08* 5.08* 6.50* 6.53* 7.08*  CALCIUM 8.2* 8.6  < > 8.8 8.2* 8.3* 8.3* 7.8*  MG 2.3 2.2  --   --   --   --   --   --   PHOS 6.2* 6.4*  --   --   --   --  5.8* 6.3*  < > = values in this interval not displayed. Liver Function Tests:  Recent Labs Lab 04/01/14 0438 04/02/14 0521 04/06/14 0541 04/07/14 0448  ALBUMIN 2.3* 2.4* 2.4* 2.3*    Recent Labs Lab 04/01/14 0438  LIPASE 827*   CBC:  Recent Labs Lab 04/03/14 1016 04/05/14 0345 04/06/14 0521  WBC 5.7 4.6 4.7  HGB 8.6* 7.6* 8.7*  HCT 24.4* 21.6* 24.9*  MCV 94.9 97.3 91.9  PLT 136* 136* 136*   CBG:  Recent Labs Lab 04/06/14 0738 04/06/14 1158 04/06/14 1710 04/06/14 2143 04/07/14 0753  GLUCAP 141* 183* 167* 183* 138*    Recent Results (from the past 240 hour(s))  SURGICAL PCR SCREEN     Status: None   Collection Time    04/02/14  9:30 PM      Result Value Ref Range Status   MRSA, PCR NEGATIVE  NEGATIVE Final   Staphylococcus aureus NEGATIVE  NEGATIVE Final   Comment:            The Xpert SA Assay (FDA     approved for NASAL specimens     in patients over 79 years of age),     is one component of     a comprehensive  surveillance     program.  Test performance has     been validated by [employer] for patients greater     than or equal to 70 year old.     It is not intended     to diagnose infection nor to     guide or monitor treatment.     Studies: No results found.  Scheduled Meds: . amLODipine  10 mg Oral QHS  . antiseptic oral rinse  7 mL Mouth Rinse q12n4p  . atenolol  50 mg Oral Daily  . chlorhexidine  15 mL Mouth Rinse BID  . doxazosin  1 mg Oral  QHS  . insulin aspart  0-15 Units Subcutaneous TID WC  . insulin aspart  0-5 Units Subcutaneous QHS  . insulin aspart  3 Units Subcutaneous TID WC  . pantoprazole  40 mg Oral QHS   Continuous Infusions: . sodium chloride 125 mL/hr at 04/06/14 2038    Active Problems:   AKI (acute kidney injury)   Metabolic acidosis   Hyperkalemia   Hyponatremia   HIV disease   Acute gastroenteritis   Protein-calorie malnutrition, severe   Acute kidney injury   Time spent: 30 minutes.    [facility], Karlee Staff  [facility] Pager [phone]  If 7PM-7AM, please contact night-coverage at [email or web address], password [facility] 04/07/2014, 10:40 AM  LOS: 16 days

## 2014-04-07 NOTE — Progress Notes (Signed)
1. ARF, recovery phase: Had good UOP  2 liters las t 24 hrs; s/p PC Will watch for clinical improvement and hold on dialysis. There is good UOP. Will cont give IVF to keep up with urinary losses, but decrease rate 2. HIV- per ID 3. HTN- blood pressure improved   Subjective: Interval History: Feels better with IVF  Objective: Vital signs in last 24 hours: Temp:  [98.4 F (36.9 C)-98.5 F (36.9 C)] 98.4 F (36.9 C) (09/04 0457) Pulse Rate:  [60-62] 60 (09/04 0457) Resp:  [17-18] 18 (09/04 0457) BP: (112-130)/(63-65) 130/63 mmHg (09/04 0457) SpO2:  [98 %-100 %] 99 % (09/04 0457) Weight:  [50.077 kg (110 lb 6.4 oz)] 50.077 kg (110 lb 6.4 oz) (09/04 0500) Weight change: -0.523 kg (-1 lb 2.4 oz)  Intake/Output from previous day: 09/03 0701 - 09/04 0700 In: 1240.4 [P.O.:480; I.V.:760.4] Out: 2075 [Urine:2075] Intake/Output this shift:    General appearance: alert and cooperative Head: Normocephalic, without obvious abnormality, atraumatic, facial fullness skin yurgor improved no CCE  Lab Results:  Recent Labs  04/05/14 0345 04/06/14 0521  WBC 4.6 4.7  HGB 7.6* 8.7*  HCT 21.6* 24.9*  PLT 136* 136*   BMET:  Recent Labs  04/06/14 0541 04/07/14 0448  NA 140 136*  K 4.2 4.1  CL 102 101  CO2 20 18*  GLUCOSE 124* 140*  BUN 72* 79*  CREATININE 6.53* 7.08*  CALCIUM 8.3* 7.8*   No results found for this basename: PTH,  in the last 72 hours Iron Studies: No results found for this basename: IRON, TIBC, TRANSFERRIN, FERRITIN,  in the last 72 hours Studies/Results: No results found.  Scheduled: . amLODipine  10 mg Oral QHS  . antiseptic oral rinse  7 mL Mouth Rinse q12n4p  . atenolol  50 mg Oral Daily  . chlorhexidine  15 mL Mouth Rinse BID  . doxazosin  1 mg Oral QHS  . insulin aspart  0-15 Units Subcutaneous TID WC  . insulin aspart  0-5 Units Subcutaneous QHS  . insulin aspart  3 Units Subcutaneous TID WC  . pantoprazole  40 mg Oral QHS     LOS: 16 days    Erinne Gillentine C 04/07/2014,11:30 AM

## 2014-04-07 NOTE — Progress Notes (Signed)
INFECTIOUS DISEASE PROGRESS NOTE  ID: [name] is a 68 y.o. male with  Active Problems:   AKI (acute kidney injury)   Metabolic acidosis   Hyperkalemia   Hyponatremia   HIV disease   Acute gastroenteritis   Protein-calorie malnutrition, severe   Acute kidney injury  Subjective: Without complaints, wants to go home.   Abtx:  Anti-infectives   Start     Dose/Rate Route Frequency Ordered Stop   04/03/14 1300  ceFAZolin (ANCEF) IVPB 2 g/50 mL premix     2 g 100 mL/hr over 30 Minutes Intravenous  Once 04/02/14 1104 04/03/14 1330   04/03/14 1300  ceFAZolin (ANCEF) powder       As needed 04/03/14 1306 04/03/14 1411   04/03/14 1250  ceFAZolin (ANCEF) 2-3 GM-% IVPB SOLR    Comments:  [name], [name]   : cabinet override      04/03/14 1250 04/03/14 1327   03/25/14 1130  vancomycin (VANCOCIN) IVPB 750 mg/150 ml premix  Status:  Discontinued     750 mg 150 mL/hr over 60 Minutes Intravenous Every 48 hours 03/23/14 1038 03/24/14 1237   03/23/14 1130  vancomycin (VANCOCIN) IVPB 1000 mg/200 mL premix     1,000 mg 200 mL/hr over 60 Minutes Intravenous  Once 03/23/14 1038 03/23/14 1500   03/23/14 1045  imipenem-cilastatin (PRIMAXIN) 250 mg in sodium chloride 0.9 % 100 mL IVPB  Status:  Discontinued     250 mg 200 mL/hr over 30 Minutes Intravenous Every 12 hours 03/23/14 1038 03/24/14 1237      Medications:  Scheduled: . amLODipine  10 mg Oral QHS  . antiseptic oral rinse  7 mL Mouth Rinse q12n4p  . atenolol  50 mg Oral Daily  . chlorhexidine  15 mL Mouth Rinse BID  . doxazosin  1 mg Oral QHS  . insulin aspart  0-15 Units Subcutaneous TID WC  . insulin aspart  0-5 Units Subcutaneous QHS  . insulin aspart  3 Units Subcutaneous TID WC  . pantoprazole  40 mg Oral QHS    Objective: Vital signs in last 24 hours: Temp:  [98.4 F (36.9 C)-98.5 F (36.9 C)] 98.4 F (36.9 C) (09/04 0457) Pulse Rate:  [60-62] 60 (09/04 0457) Resp:  [17-18] 18 (09/04 0457) BP: (112-130)/(63-65)  130/63 mmHg (09/04 0457) SpO2:  [98 %-100 %] 99 % (09/04 0457) Weight:  [50.077 kg (110 lb 6.4 oz)] 50.077 kg (110 lb 6.4 oz) (09/04 0500)   General appearance: alert, cooperative and no distress Resp: clear to auscultation bilaterally Cardio: regular rate and rhythm GI: normal findings: bowel sounds normal and soft, non-tender  Lab Results  Recent Labs  04/05/14 0345 04/06/14 0521 04/06/14 0541 04/07/14 0448  WBC 4.6 4.7  --   --   HGB 7.6* 8.7*  --   --   HCT 21.6* 24.9*  --   --   NA 138 140 140 136*  K 3.8 4.2 4.2 4.1  CL 98 102 102 101  CO2 24 22 20  18*  BUN 56* 72* 72* 79*  CREATININE 5.08* 6.50* 6.53* 7.08*   Liver Panel  Recent Labs  04/06/14 0541 04/07/14 0448  ALBUMIN 2.4* 2.3*   Sedimentation Rate No results found for this basename: ESRSEDRATE,  in the last 72 hours C-Reactive Protein No results found for this basename: CRP,  in the last 72 hours  Microbiology: Recent Results (from the past 240 hour(s))  SURGICAL PCR SCREEN     Status: None  Collection Time    04/02/14  9:30 PM      Result Value Ref Range Status   MRSA, PCR NEGATIVE  NEGATIVE Final   Staphylococcus aureus NEGATIVE  NEGATIVE Final   Comment:            The Xpert SA Assay (FDA     approved for NASAL specimens     in patients over 25 years of age),     is one component of     a comprehensive surveillance     program.  Test performance has     been validated by [employer] for patients greater     than or equal to 84 year old.     It is not intended     to diagnose infection nor to     guide or monitor treatment.    Studies/Results: No results found.   Assessment/Plan: HIV+  ARF (tunneled HD catheter placed 8-31)  Pancreatitis  Anemia   Total days of antibiotics: none, off art.  Cr continues to increase.  Will follow up in ID clinic F/u hgb, Cr.  Greatly appreciate Renal f/u.          Bobby Rumpf Infectious Diseases (pager)  [phone] [email or web address] 04/07/2014, 11:44 AM  LOS: 16 days

## 2014-04-08 LAB — CBC
HCT: 22.3 % — ABNORMAL LOW (ref 39.0–52.0)
HEMOGLOBIN: 7.9 g/dL — AB (ref 13.0–17.0)
MCH: 33.2 pg (ref 26.0–34.0)
MCHC: 35.4 g/dL (ref 30.0–36.0)
MCV: 93.7 fL (ref 78.0–100.0)
PLATELETS: 129 10*3/uL — AB (ref 150–400)
RBC: 2.38 MIL/uL — ABNORMAL LOW (ref 4.22–5.81)
RDW: 14.3 % (ref 11.5–15.5)
WBC: 4.3 10*3/uL (ref 4.0–10.5)

## 2014-04-08 LAB — BASIC METABOLIC PANEL
Anion gap: 18 — ABNORMAL HIGH (ref 5–15)
Anion gap: 17 — ABNORMAL HIGH (ref 5–15)
BUN: 83 mg/dL — ABNORMAL HIGH (ref 6–23)
BUN: 81 mg/dL — AB (ref 6–23)
CO2: 20 mEq/L (ref 19–32)
CO2: 23 mEq/L (ref 19–32)
CREATININE: 7.27 mg/dL — AB (ref 0.50–1.35)
Calcium: 8.2 mg/dL — ABNORMAL LOW (ref 8.4–10.5)
Calcium: 8.5 mg/dL (ref 8.4–10.5)
Chloride: 103 mEq/L (ref 96–112)
Chloride: 99 mEq/L (ref 96–112)
Creatinine, Ser: 7.35 mg/dL — ABNORMAL HIGH (ref 0.50–1.35)
GFR calc Af Amer: 8 mL/min — ABNORMAL LOW (ref >90)
GFR, EST AFRICAN AMERICAN: 8 mL/min — AB (ref >90)
GFR, EST NON AFRICAN AMERICAN: 7 mL/min — AB (ref >90)
GFR, EST NON AFRICAN AMERICAN: 7 mL/min — AB (ref >90)
GLUCOSE: 149 mg/dL — AB (ref 70–99)
GLUCOSE: 130 mg/dL — AB (ref 70–99)
Potassium: 4.3 mEq/L (ref 3.7–5.3)
Potassium: 4.3 mEq/L (ref 3.7–5.3)
SODIUM: 141 meq/L (ref 137–147)
Sodium: 139 mEq/L (ref 137–147)

## 2014-04-08 LAB — GLUCOSE, CAPILLARY
GLUCOSE-CAPILLARY: 209 mg/dL — AB (ref 70–99)
Glucose-Capillary: 136 mg/dL — ABNORMAL HIGH (ref 70–99)
Glucose-Capillary: 139 mg/dL — ABNORMAL HIGH (ref 70–99)
Glucose-Capillary: 149 mg/dL — ABNORMAL HIGH (ref 70–99)

## 2014-04-08 NOTE — Progress Notes (Signed)
[facility] PROGRESS NOTE  [name] VWP:[id number] DOB: Jul 17, 1946 DOA: 03/22/2014 PCP: Penni Homans, MD  Brief HPI: 68 year old male with HIV on STRIBILD presented to [facility] c/o emesis, diarrhea, and generalized weakness x 3 days. In ED found to be in renal failure with elevated lipase. Transferred to [facility] for ICU admission. [facility] subsequently [facility]. Patient admitted with respiratory failure, renal failure, and acute pancreatitis. He required intermittent dialysis. His respiratory failure has improved after volume removed with dialysis. Pancreatitis is improved. Renal function was getting worse. He was started on dialysis on 8/31 after a HD catheter was placed by IR.  Assessment/Plan: Acute renal failure, initially oliguric, currently nonoliguric. He was dialysed 8/31. Patient making urine. Plan is to watch him again off dialysis. Initially thought to be secondary to volume depletion, diuretics, SIRS. Had dialysis 8/21, 8/22, 8/25. And then he was being monitored closely. But his creatinine continued to rise and so ws started on HD as mentioned earlier. Continue Sod bicarb. Further management per Nephrology. Femoral dialysis catheter was removed 8/28. Currently has Crellin cath, Creatinine continues to worsen, but continues to have good urine out put, management as per nephrology,  Acute pancreatitis  He is much improved. This was possibly due to STRIBILD (hx of this with other ARV meds). Abd [place] > no obvious pancreatic abnormality, similar dilated CBD compared with 4/15. HIV medications can cause elevation of amylase and lipase. MRI, MRCP as below. GI has signed off, denies any abdominal pain.  Acute on chronic respiratory failure  Resolved. Likely due to uncompensated met acidosis pulm edema? Vs pancreatitis induced ALI. Repeated chest x ray: 8-24: Persistent asymmetric airspace process, likely bilateral infiltrates. No need for antibiotics per ID.   DM2  CBG's  increasing. Was on Metformin at home which cannot be restarted due to renal failure. Improved some with meal coverage. May need to utilize Lantus depending on CBG's. A1c was 7.5 in July. Monitor for now. Continue with sliding scale.  Chronic Diarrhea Ongoing for last 6 months. C diff was negative.   Thrombocytopenia Improved.   HIV Holding HIV medications as they could have caused pancreatitis. ID following , patient will follow with them after discharge.   COPD without evidence of exacerbation.  Stable  Hypertension BP better. Will not be too aggressive as he is now on dialysis. Increased Amlodipine dose 8/26. Continue atenolol. May need to taper down meds if BP starts dropping.  Acute encephalopathy in setting of acidosis Resolved  Hyperkalemia  Resolved  Anemia  due to CKD,  transfused 1 unit PRBC 9/2, may need Procrit.  DVT Prophylaxis: SCD's Code Status: Full Code.  Family Communication: Discussed with patient.  Disposition Plan: Unclear for now. Per Nephrology.    Consultants:  Nephrology  ID  GI.   Procedures: 8/20 [place] abdo>>>Visualized pancreas has a normal sonographic appearance. No biliary sludge or gallstones. Upper limits of normal to mildly dilated CBD, the similar to the abdomen ultrasound in April. No intrahepatic biliary ductal dilatation identified   8/21 TTE > Mild LVH, EF 60%, PAP 59mHg  8/31: Tunneled HD catheter placed by IR 9/2: Transfused one unit packed red blood cell. Antibiotics:  none  HPI/Subjective: He feels well. No complaints. Ambulating. Wants to go home.  Objective: Filed Vitals:   04/08/14 1038  BP: 127/64  Pulse: 63  Temp: 98.2 F (36.8 C)  Resp: 17    Intake/Output Summary (Last 24 hours) at 04/08/14 1231 Last data filed at 04/08/14  0900  Gross per 24 hour  Intake 1718.75 ml  Output   3750 ml  Net -2031.25 ml   Filed Weights   04/06/14 2148 04/07/14 0500 04/07/14 2107  Weight: 50.077 kg (110 lb 6.4 oz) 50.077  kg (110 lb 6.4 oz) 50.077 kg (110 lb 6.4 oz)    Exam:   General:  Alert in no distress.   Cardiovascular: S 1, S 2 RRR  Respiratory: CTA bilaterally. No crackles  Abdomen: BS present, soft, NT  Musculoskeletal: Mild pitting  Edema in his feet.  Data Reviewed: Basic Metabolic Panel:  Recent Labs Lab 04/02/14 0521  04/05/14 0345 04/06/14 0521 04/06/14 0541 04/07/14 0448 04/08/14 0413  NA 136*  < > 138 140 140 136* 141  K 4.6  < > 3.8 4.2 4.2 4.1 4.3  CL 97  < > 98 102 102 101 103  CO2 16*  < > _0 18* 20  GLUCOSE 154*  < > 147* 125* 124* 140* 149*  BUN 107*  < > 56* 72* 72* 79* 83*  CREATININE 8.13*  < > 5.08* 6.50* 6.53* 7.08* 7.35*  CALCIUM 8.6  < > 8.2* 8.3* 8.3* 7.8* 8.2*  MG 2.2  --   --   --   --   --   --   PHOS 6.4*  --   --   --  5.8* 6.3*  --   < > = values in this interval not displayed. Liver Function Tests:  Recent Labs Lab 04/02/14 0521 04/06/14 0541 04/07/14 0448  ALBUMIN 2.4* 2.4* 2.3*   No results found for this basename: LIPASE, AMYLASE,  in the last 168 hours CBC:  Recent Labs Lab 04/03/14 1016 04/05/14 0345 04/06/14 0521 04/08/14 0413  WBC 5.7 4.6 4.7 4.3  HGB 8.6* 7.6* 8.7* 7.9*  HCT 24.4* 21.6* 24.9* 22.3*  MCV 94.9 97.3 91.9 93.7  PLT 136* 136* 136* 129*   CBG:  Recent Labs Lab 04/07/14 1137 04/07/14 1725 04/07/14 2104 04/08/14 0733 04/08/14 1142  GLUCAP 160* 164* 193* 136* 139*    Recent Results (from the past 240 hour(s))  SURGICAL PCR SCREEN     Status: None   Collection Time    04/02/14  9:30 PM      Result Value Ref Range Status   MRSA, PCR NEGATIVE  NEGATIVE Final   Staphylococcus aureus NEGATIVE  NEGATIVE Final   Comment:            The Xpert SA Assay (FDA     approved for NASAL specimens     in patients over 77 years of age),     is one component of     a comprehensive surveillance     program.  Test performance has     been validated by [employer] for patients greater     than or equal  to 71 year old.     It is not intended     to diagnose infection nor to     guide or monitor treatment.     Studies: No results found.  Scheduled Meds: . amLODipine  10 mg Oral QHS  . antiseptic oral rinse  7 mL Mouth Rinse q12n4p  . atenolol  50 mg Oral Daily  . chlorhexidine  15 mL Mouth Rinse BID  . doxazosin  1 mg Oral QHS  . insulin aspart  0-15 Units Subcutaneous TID WC  . insulin aspart  0-5 Units Subcutaneous QHS  .  insulin aspart  3 Units Subcutaneous TID WC  . pantoprazole  40 mg Oral QHS   Continuous Infusions: .  sodium bicarbonate  infusion 1000 mL 50 mL/hr at 04/08/14 1203    Active Problems:   AKI (acute kidney injury)   Metabolic acidosis   Hyperkalemia   Hyponatremia   HIV disease   Acute gastroenteritis   Protein-calorie malnutrition, severe   Acute kidney injury   Time spent: 20 minutes.    [facility], Nakeisha Greenhouse  [facility] Pager [phone]  If 7PM-7AM, please contact night-coverage at [email or web address], password [facility] 04/08/2014, 12:31 PM  LOS: 17 days

## 2014-04-08 NOTE — Progress Notes (Signed)
1. AKI, recovery phase is slow, reduce IVF 2. HIV- per ID 3. HTN- blood pressure improved   Subjective: Interval History: OK, good UOP   Objective: Vital signs in last 24 hours: Temp:  [98.1 F (36.7 C)-98.6 F (37 C)] 98.2 F (36.8 C) (09/05 1038) Pulse Rate:  [62-66] 63 (09/05 1038) Resp:  [17-18] 17 (09/05 1038) BP: (125-141)/(60-72) 127/64 mmHg (09/05 1038) SpO2:  [100 %] 100 % (09/05 1038) Weight:  [50.077 kg (110 lb 6.4 oz)] 50.077 kg (110 lb 6.4 oz) (09/04 2107) Weight change: 0 kg (0 lb)  Intake/Output from previous day: 09/04 0701 - 09/05 0700 In: 1718.8 [P.O.:1320; I.V.:398.8] Out: 3150 [Urine:3150] Intake/Output this shift: Total I/O In: 240 [P.O.:240] Out: 900 [Urine:900]  General appearance: alert and cooperative Lung clear Cor RRR Ext No CCE  Lab Results:  Recent Labs  04/06/14 0521 04/08/14 0413  WBC 4.7 4.3  HGB 8.7* 7.9*  HCT 24.9* 22.3*  PLT 136* 129*   BMET:  Recent Labs  04/07/14 0448 04/08/14 0413  NA 136* 141  K 4.1 4.3  CL 101 103  CO2 18* 20  GLUCOSE 140* 149*  BUN 79* 83*  CREATININE 7.08* 7.35*  CALCIUM 7.8* 8.2*   No results found for this basename: PTH,  in the last 72 hours Iron Studies: No results found for this basename: IRON, TIBC, TRANSFERRIN, FERRITIN,  in the last 72 hours Studies/Results: No results found.  Scheduled: . amLODipine  10 mg Oral QHS  . antiseptic oral rinse  7 mL Mouth Rinse q12n4p  . atenolol  50 mg Oral Daily  . chlorhexidine  15 mL Mouth Rinse BID  . doxazosin  1 mg Oral QHS  . insulin aspart  0-15 Units Subcutaneous TID WC  . insulin aspart  0-5 Units Subcutaneous QHS  . insulin aspart  3 Units Subcutaneous TID WC  . pantoprazole  40 mg Oral QHS       LOS: 17 days   [name] C 04/08/2014,10:55 AM

## 2014-04-09 LAB — GLUCOSE, CAPILLARY
GLUCOSE-CAPILLARY: 158 mg/dL — AB (ref 70–99)
GLUCOSE-CAPILLARY: 141 mg/dL — AB (ref 70–99)
Glucose-Capillary: 176 mg/dL — ABNORMAL HIGH (ref 70–99)
Glucose-Capillary: 110 mg/dL — ABNORMAL HIGH (ref 70–99)

## 2014-04-09 LAB — BASIC METABOLIC PANEL
ANION GAP: 17 — AB (ref 5–15)
BUN: 86 mg/dL — AB (ref 6–23)
CALCIUM: 8.1 mg/dL — AB (ref 8.4–10.5)
CHLORIDE: 100 meq/L (ref 96–112)
CO2: 22 mEq/L (ref 19–32)
CREATININE: 7.11 mg/dL — AB (ref 0.50–1.35)
GFR calc Af Amer: 8 mL/min — ABNORMAL LOW (ref >90)
GFR calc non Af Amer: 7 mL/min — ABNORMAL LOW (ref >90)
Glucose, Bld: 129 mg/dL — ABNORMAL HIGH (ref 70–99)
Potassium: 4 mEq/L (ref 3.7–5.3)
Sodium: 139 mEq/L (ref 137–147)

## 2014-04-09 LAB — CBC
HEMATOCRIT: 21.5 % — AB (ref 39.0–52.0)
Hemoglobin: 7.3 g/dL — ABNORMAL LOW (ref 13.0–17.0)
MCH: 31.9 pg (ref 26.0–34.0)
MCHC: 34 g/dL (ref 30.0–36.0)
MCV: 93.9 fL (ref 78.0–100.0)
PLATELETS: 138 10*3/uL — AB (ref 150–400)
RBC: 2.29 MIL/uL — ABNORMAL LOW (ref 4.22–5.81)
RDW: 14.1 % (ref 11.5–15.5)
WBC: 2.5 10*3/uL — ABNORMAL LOW (ref 4.0–10.5)

## 2014-04-09 NOTE — Progress Notes (Signed)
[facility] PROGRESS NOTE  [name] RAQ:[id number] DOB: 1945/10/27 DOA: 03/22/2014 PCP: Penni Homans, MD  Brief HPI: 68 year old male with HIV on STRIBILD presented to [place] c/o emesis, diarrhea, and generalized weakness x 3 days. In ED found to be in renal failure with elevated lipase. Transferred to [facility] for ICU admission. [facility] subsequently [facility]. Patient admitted with respiratory failure, renal failure, and acute pancreatitis. He required intermittent dialysis. His respiratory failure has improved after volume removed with dialysis. Pancreatitis is improved. Renal function was getting worse. He was started on dialysis on 8/31 after a HD catheter was placed by IR.  Assessment/Plan: Acute renal failure, initially oliguric, currently nonoliguric. He was dialysed 8/31. Patient making urine. Plan is to watch him again off dialysis. Initially thought to be secondary to volume depletion, diuretics, SIRS. Had dialysis 8/21, 8/22, 8/25. And then he was being monitored closely. But his creatinine continued to rise and so ws started on HD as mentioned earlier. Continue Sod bicarb. Further management per Nephrology. Femoral dialysis catheter was removed 8/28. Currently has [place] cath, Creatinine stabilized ,  continues to have good urine out put, management as per nephrology,  Acute pancreatitis  He is much improved. This was possibly due to STRIBILD (hx of this with other ARV meds). Abd [place] > no obvious pancreatic abnormality, similar dilated CBD compared with 4/15. HIV medications can cause elevation of amylase and lipase. MRI, MRCP as below. GI has signed off, denies any abdominal pain.  Acute on chronic respiratory failure  Resolved. Likely due to uncompensated met acidosis pulm edema? Vs pancreatitis induced ALI. Repeated chest x ray: 8-24: Persistent asymmetric airspace process, likely bilateral infiltrates. No need for antibiotics per ID.   DM2  CBG's increasing.  Was on Metformin at home which cannot be restarted due to renal failure. Improved some with meal coverage. May need to utilize Lantus depending on CBG's. A1c was 7.5 in July. Monitor for now. Continue with sliding scale.  Chronic Diarrhea Ongoing for last 6 months. C diff was negative.   Thrombocytopenia Improved.   HIV Holding HIV medications as they could have caused pancreatitis. ID following , patient will follow with them after discharge.   COPD without evidence of exacerbation.  Stable  Hypertension BP better. Will not be too aggressive as he is now on dialysis. Increased Amlodipine dose 8/26. Continue atenolol. May need to taper down meds if BP starts dropping.  Acute encephalopathy in setting of acidosis Resolved  Hyperkalemia  Resolved  Anemia  due to CKD,  transfused 1 unit PRBC 9/2, may need Procrit.  DVT Prophylaxis: SCD's Code Status: Full Code.  Family Communication: Discussed with patient.  Disposition Plan: Unclear for now. Per Nephrology.    Consultants:  Nephrology  ID  GI.   Procedures: 8/20 [place] abdo>>>Visualized pancreas has a normal sonographic appearance. No biliary sludge or gallstones. Upper limits of normal to mildly dilated CBD, the similar to the abdomen ultrasound in April. No intrahepatic biliary ductal dilatation identified   8/21 TTE > Mild LVH, EF 60%, PAP 81mHg  8/31: Tunneled HD catheter placed by IR 9/2: Transfused one unit packed red blood cell. Antibiotics:  none  HPI/Subjective: He feels well. No complaints. Ambulating. Wants to go home.  Objective: Filed Vitals:   04/09/14 0844  BP: 119/68  Pulse: 61  Temp: 98.5 F (36.9 C)  Resp: 16    Intake/Output Summary (Last 24 hours) at 04/09/14 1312 Last data filed at 04/09/14 [phone]  Gross per 24 hour  Intake   1320 ml  Output   3191 ml  Net  -1871 ml   Filed Weights   04/07/14 0500 04/07/14 2107 04/08/14 2040  Weight: 50.077 kg (110 lb 6.4 oz) 50.077 kg (110 lb 6.4  oz) 50.531 kg (111 lb 6.4 oz)    Exam:   General:  Alert in no distress.   Cardiovascular: S 1, S 2 RRR  Respiratory: CTA bilaterally. No crackles  Abdomen: BS present, soft, NT  Musculoskeletal: Mild pitting  Edema in his feet.  Data Reviewed: Basic Metabolic Panel:  Recent Labs Lab 04/06/14 0541 04/07/14 0448 04/08/14 0413 04/08/14 1220 04/09/14 0410  NA 140 136* 141 139 139  K 4.2 4.1 4.3 4.3 4.0  CL 102 101 103 99 100  CO2 20 18* _0 GLUCOSE 124* 140* 149* 130* 129*  BUN 72* 79* 83* 81* 86*  CREATININE 6.53* 7.08* 7.35* 7.27* 7.11*  CALCIUM 8.3* 7.8* 8.2* 8.5 8.1*  PHOS 5.8* 6.3*  --   --   --    Liver Function Tests:  Recent Labs Lab 04/06/14 0541 04/07/14 0448  ALBUMIN 2.4* 2.3*   No results found for this basename: LIPASE, AMYLASE,  in the last 168 hours CBC:  Recent Labs Lab 04/03/14 1016 04/05/14 0345 04/06/14 0521 04/08/14 0413 04/09/14 0410  WBC 5.7 4.6 4.7 4.3 2.5*  HGB 8.6* 7.6* 8.7* 7.9* 7.3*  HCT 24.4* 21.6* 24.9* 22.3* 21.5*  MCV 94.9 97.3 91.9 93.7 93.9  PLT 136* 136* 136* 129* 138*   CBG:  Recent Labs Lab 04/08/14 1142 04/08/14 1711 04/08/14 2035 04/09/14 0840 04/09/14 1227  GLUCAP 139* 149* 209* 176* 110*    Recent Results (from the past 240 hour(s))  SURGICAL PCR SCREEN     Status: None   Collection Time    04/02/14  9:30 PM      Result Value Ref Range Status   MRSA, PCR NEGATIVE  NEGATIVE Final   Staphylococcus aureus NEGATIVE  NEGATIVE Final   Comment:            The Xpert SA Assay (FDA     approved for NASAL specimens     in patients over 86 years of age),     is one component of     a comprehensive surveillance     program.  Test performance has     been validated by [employer] for patients greater     than or equal to 41 year old.     It is not intended     to diagnose infection nor to     guide or monitor treatment.     Studies: No results found.  Scheduled Meds: . amLODipine  10 mg  Oral QHS  . antiseptic oral rinse  7 mL Mouth Rinse q12n4p  . atenolol  50 mg Oral Daily  . chlorhexidine  15 mL Mouth Rinse BID  . doxazosin  1 mg Oral QHS  . insulin aspart  0-15 Units Subcutaneous TID WC  . insulin aspart  0-5 Units Subcutaneous QHS  . insulin aspart  3 Units Subcutaneous TID WC  . pantoprazole  40 mg Oral QHS   Continuous Infusions: .  sodium bicarbonate  infusion 1000 mL 50 mL/hr at 04/08/14 1825    Active Problems:   AKI (acute kidney injury)   Metabolic acidosis   Hyperkalemia   Hyponatremia   HIV disease   Acute gastroenteritis  Protein-calorie malnutrition, severe   Acute kidney injury   Time spent: 20 minutes.    [name], [name]  [facility] Pager [phone]  If 7PM-7AM, please contact night-coverage at [email or web address], password [email or web address] 04/09/2014, 1:12 PM  LOS: 18 days    

## 2014-04-09 NOTE — Progress Notes (Signed)
1. AKI, recovery phase is slow, d/c IVF .  prob d/c when creat <6, PC removed and UOP less 2. HIV- per ID 3. HTN- blood pressure improved  4. Tunneled IJ cath   Subjective: Interval History: copious UOP  Objective: Vital signs in last 24 hours: Temp:  [98.5 F (36.9 C)-98.9 F (37.2 C)] 98.5 F (36.9 C) (09/06 0844) Pulse Rate:  [61-66] 61 (09/06 0844) Resp:  [16-18] 16 (09/06 0844) BP: (119-148)/(66-68) 119/68 mmHg (09/06 0844) SpO2:  [100 %] 100 % (09/06 0844) Weight:  [50.531 kg (111 lb 6.4 oz)] 50.531 kg (111 lb 6.4 oz) (09/05 2040) Weight change: 0.454 kg (1 lb)  Intake/Output from previous day: 09/05 0701 - 09/06 0700 In: 1320 [P.O.:1320] Out: 3841 [Urine:3840; Stool:1] Intake/Output this shift: Total I/O In: 600 [P.O.:600] Out: 250 [Urine:250]  General appearance: alert and cooperative Back: negative, symmetric, no curvature. ROM normal. No CVA tenderness. Resp: clear to auscultation bilaterally Cardio: regular rate and rhythm, S1, S2 normal, no murmur, click, rub or gallop Extremities: extremities normal, atraumatic, no cyanosis or edema  Lab Results:  Recent Labs  04/08/14 0413 04/09/14 0410  WBC 4.3 2.5*  HGB 7.9* 7.3*  HCT 22.3* 21.5*  PLT 129* 138*   BMET:  Recent Labs  04/08/14 1220 04/09/14 0410  NA 139 139  K 4.3 4.0  CL 99 100  CO2 23 22  GLUCOSE 130* 129*  BUN 81* 86*  CREATININE 7.27* 7.11*  CALCIUM 8.5 8.1*   No results found for this basename: PTH,  in the last 72 hours Iron Studies: No results found for this basename: IRON, TIBC, TRANSFERRIN, FERRITIN,  in the last 72 hours Studies/Results: No results found.  Scheduled: . amLODipine  10 mg Oral QHS  . antiseptic oral rinse  7 mL Mouth Rinse q12n4p  . atenolol  50 mg Oral Daily  . chlorhexidine  15 mL Mouth Rinse BID  . doxazosin  1 mg Oral QHS  . insulin aspart  0-15 Units Subcutaneous TID WC  . insulin aspart  0-5 Units Subcutaneous QHS  . insulin aspart  3 Units  Subcutaneous TID WC  . pantoprazole  40 mg Oral QHS     LOS: 18 days   [name] C 04/09/2014,2:35 PM

## 2014-04-10 LAB — BASIC METABOLIC PANEL
ANION GAP: 18 — AB (ref 5–15)
BUN: 85 mg/dL — ABNORMAL HIGH (ref 6–23)
CO2: 22 mEq/L (ref 19–32)
CREATININE: 7.07 mg/dL — AB (ref 0.50–1.35)
Calcium: 8.7 mg/dL (ref 8.4–10.5)
Chloride: 100 mEq/L (ref 96–112)
GFR calc Af Amer: 8 mL/min — ABNORMAL LOW (ref >90)
GFR calc non Af Amer: 7 mL/min — ABNORMAL LOW (ref >90)
Glucose, Bld: 130 mg/dL — ABNORMAL HIGH (ref 70–99)
Potassium: 4.3 mEq/L (ref 3.7–5.3)
Sodium: 140 mEq/L (ref 137–147)

## 2014-04-10 LAB — GLUCOSE, CAPILLARY
Glucose-Capillary: 136 mg/dL — ABNORMAL HIGH (ref 70–99)
Glucose-Capillary: 148 mg/dL — ABNORMAL HIGH (ref 70–99)

## 2014-04-10 MED ORDER — GLIPIZIDE 2.5 MG HALF TABLET: 2.5000 mg | ORAL_TABLET | Freq: Every day | ORAL | Status: DC

## 2014-04-10 MED ORDER — VALACYCLOVIR HCL 500 MG PO TABS: 500.0000 mg | ORAL_TABLET | Freq: Every day | ORAL | Status: DC

## 2014-04-10 MED ORDER — AMLODIPINE BESYLATE 10 MG PO TABS: 10.0000 mg | ORAL_TABLET | Freq: Every day | ORAL | Status: DC

## 2014-04-10 MED ORDER — DOXAZOSIN MESYLATE 1 MG PO TABS: 1.0000 mg | ORAL_TABLET | Freq: Every day | ORAL | Status: DC

## 2014-04-10 NOTE — Discharge Instructions (Signed)
Follow with Primary MD Penni Homans, MD in 7 days   Get CBC, CMP, 2 view Chest X ray checked  by Primary MD next visit.  Please follow with [facility], about taking her BMP every Monday and Thursday.  Activity: As tolerated with Full fall precautions use walker/cane & assistance as needed Please follow the instructions of nursing staff teaching about your temporary dialysis cath.   Disposition Home    Diet: Carb notified, renal nondialysis diet , feeding assistance and aspiration precautions if needed.  For Heart failure patients - Check your Weight same time everyday, if you gain over 2 pounds, or you develop in leg swelling, experience more shortness of breath or chest pain, call your Primary MD immediately. Follow Cardiac Low Salt Diet and 1.8 lit/day fluid restriction.   On your next visit with her primary care physician please Get Medicines reviewed and adjusted.  Please request your Prim.MD to go over all Hospital Tests and Procedure/Radiological results at the follow up, please get all Hospital records sent to your Prim MD by signing hospital release before you go home.   If you experience worsening of your admission symptoms, develop shortness of breath, life threatening emergency, suicidal or homicidal thoughts you must seek medical attention immediately by calling 911 or calling your MD immediately  if symptoms less severe.  You Must read complete instructions/literature along with all the possible adverse reactions/side effects for all the Medicines you take and that have been prescribed to you. Take any new Medicines after you have completely understood and accpet all the possible adverse reactions/side effects.   Do not drive, operating heavy machinery, perform activities at heights, swimming or participation in water activities or provide baby sitting services if your were admitted for syncope or siezures until you have seen by Primary MD or a Neurologist and advised to  do so again.  Do not drive when taking Pain medications.    Do not take more than prescribed Pain, Sleep and Anxiety Medications  Special Instructions: If you have smoked or chewed Tobacco  in the last 2 yrs please stop smoking, stop any regular Alcohol  and or any Recreational drug use.  Wear Seat belts while driving.   Please note  You were cared for by a hospitalist during your hospital stay. If you have any questions about your discharge medications or the care you received while you were in the hospital after you are discharged, you can call the unit and asked to speak with the hospitalist on call if the hospitalist that took care of you is not available. Once you are discharged, your primary care physician will handle any further medical issues. Please note that NO REFILLS for any discharge medications will be authorized once you are discharged, as it is imperative that you return to your primary care physician (or establish a relationship with a primary care physician if you do not have one) for your aftercare needs so that they can reassess your need for medications and monitor your lab values.  Please hold your glipizide if he were fingersticks less than 110 the day before, or you have hypoglycemia.

## 2014-04-10 NOTE — Discharge Summary (Signed)
[name], 68 y.o., DOB 04/16/1946, MRN [id number]. Admission date: 03/22/2014 Discharge Date 04/10/2014 Primary MD Penni Homans, MD Admitting Physician Raylene Miyamoto, MD  Admission Diagnosis  Hyperkalemia [276.7] Hypochloremia [276.9] Lactic acidosis [276.2] Hyponatremia [276.1] Multiorgan failure [799.9] Acute pancreatitis, unspecified pancreatitis type [577.0] Acute renal failure, unspecified acute renal failure type [584.9]  Discharge Diagnosis   Active Problems:   AKI (acute kidney injury)   Metabolic acidosis   Hyperkalemia   Hyponatremia   HIV disease   Acute gastroenteritis   Protein-calorie malnutrition, severe   Acute kidney injury      Past Medical History  Diagnosis Date  . Arthritis   . Diabetes mellitus   . History of depression   . GERD (gastroesophageal reflux disease)   . CAD (coronary artery disease)     2003- cabg  . Hypertension   . Hyperlipidemia   . History of kidney stones   . COPD (chronic obstructive pulmonary disease)   . HIV (human immunodeficiency virus infection) 1991    on meds since initial dx.   . Dermatitis 11/27/2012  . Nocturia 03/06/2013  . Epicondylitis 06/05/2013    right  . Pancreatitis 11/2013    attributed to HIV meds.     Past Surgical History  Procedure Laterality Date  . Coronary artery bypass graft  05/2002  . Vasectomy      Brief HPI:  68 year old male with HIV on STRIBILD presented to [place] c/o emesis, diarrhea, and generalized weakness x 3 days. In ED found to be in renal failure with elevated lipase. Transferred to [facility] for ICU admission. Patient subsequently transfer to [facility]. Patient admitted with respiratory failure, renal failure, and acute pancreatitis. He required intermittent dialysis. His respiratory failure has improved after volume removed with dialysis. Pancreatitis is improved. Renal function was getting worse. He was started on dialysis on 8/31 after a HD catheter was placed by IR. Patient has  been off hemodialysis since September 1, and has been having good urine output, initially was on aggressive IV fluid resuscitation, which was stopped, as his renal function has since stabilized, his creatinine on day of discharge is 7, she is to continue following with nephrology as an outpatient, and follow labs closely. As well patient was noticed to have elevated lipase level, diagnosed with acute pancreatitis which was possibly do to [facility], which has been held.  Hospital Course See H&P, Labs, Consult and Test reports for all details in brief,   Acute renal failure, oliguric , High AG metabolic acidosis (suspect lactic, uremic)  He was dialysed 8/31. Patient making urine. Plan is to watch him again off dialysis. Initially thought to be secondary to volume depletion, diuretics, SIRS. Had dialysis 8/21, 8/22, 8/25. And then he was being monitored closely. But his creatinine continued to rise and so ws started on HD as mentioned earlier. Continue Sod bicarb. Further management per Nephrology. Femoral dialysis catheter was removed 8/28. Patient has him a dialysis catheter placed by IR on August 31, he was dialyzed that day as well, but patient was noticed to have good urine output, so his hemodialysis has been held since, initially started to rise up off hemodialysis, then stabilized around 7, patient denies any uremic symptoms, so he is being discharged with close followup with nephrology service, she will be discharged with Port-A-Cath, where he is going to continue having his labs checked regularly through [place] [facility], and a decision with his Port-A-Cath can be done as an outpatient.  Acute pancreatitis  He is much improved. This was possibly due to STRIBILD (hx of this with other ARV meds). Abd [place] > no obvious pancreatic abnormality, similar dilated CBD compared with 4/15. HIV medications can cause elevation of amylase and lipase. MRI, MRCP as below. GI has signed off. Patient will follow as an  outpatient with infectious disease one week from discharge, about resumption of his antiviral therapy.  Acute on chronic respiratory failure  Resolved. Likely due to uncompensated met acidosis pulm edema? Vs pancreatitis induced ALI. Repeated chest x ray: 8-24: Persistent asymmetric airspace process, likely bilateral infiltrates. No need for antibiotics per ID. Resolved, currently saturating well on room air.  DM2 uncontrolled  Patient was on metformin at home, which has been stopped here given his renal failure, has been told on sliding scale, without much demand, he will be discharged home on glipizide 2.5 mg oral daily, and to follow with his PCP if his uncontrolled to increase his glipizide dose, and to hold his glipizide if he is hypoglycemic.  Chronic Diarrhea  Ongoing for last 6 months. C diff was negative.   Thrombocytopenia  Improved.   HIV  Holding HIV medications as they could have caused pancreatitis. She is to follow with ID after discharge about  resuming his medications.   COPD without evidence of exacerbation.  Stable   Hypertension  Has been controlled on Doxazosin  and Norvasc  Acute encephalopathy in setting of acidosis  Resolved   Hyperkalemia  Resolved   Consultants:  Nephrology  ID  GI.  Procedures:  8/20 [place] abdo>>>Visualized pancreas has a normal sonographic appearance. No biliary sludge or gallstones. Upper limits of normal to mildly dilated CBD, the similar to the abdomen ultrasound in April. No intrahepatic biliary ductal dilatation identified  8/21 TTE > Mild LVH, EF 60%, PAP 14mmHg  8/31: Tunneled HD catheter placed by IR  9/2: Transfused one unit packed red blood cell.   Significant Tests:  See full reports for all details    Ct Abdomen Pelvis Wo Contrast  03/22/2014   CLINICAL DATA:  Nausea, vomiting and diarrhea.  EXAM: CT ABDOMEN AND PELVIS WITHOUT CONTRAST  TECHNIQUE: Multidetector CT imaging of the abdomen and pelvis was performed  following the standard protocol without IV contrast.  COMPARISON:  11/15/2013  FINDINGS: The lung bases are grossly clear.  The liver is grossly normal and stable. Small calcified granulomas are noted. The gallbladder is grossly normal. No common bile duct dilatation. The pancreas is grossly normal. The spleen is normal in size. No focal lesions. The adrenal glands and kidneys are unremarkable without contrast. No hydronephrosis or obstructing ureteral calculi.  The stomach is moderately distended and there is interstitial changes around the stomach. Could not exclude partial gastric outlet obstruction. Mild wall thickening involving the duodenum but no obvious ulceration. No dilatation of the small bowel or colon. There is mesenteric edema and a small amount of abdominal and pelvic fluid which was also present on the prior study.  No mesenteric or retroperitoneal mass or adenopathy. The aorta is normal in caliber.  The bladder, prostate gland and seminal vesicles are unremarkable. No pelvic mass or adenopathy. There is a small amount of free pelvic fluid. No inguinal mass or adenopathy. Mild diffuse body wall edema.  IMPRESSION: 1. Distended stomach with surrounding interstitial change in fluid. Could not exclude a partial gastric outlet obstruction. 2. Mesenteric edema and scattered abdominal and pelvic fluid appears relatively stable when compared to prior CT scan. 3. Body  wall edema.   Electronically Signed   By: Kalman Jewels M.D.   On: 03/22/2014 23:32   US Abdomen Complete  03/23/2014   CLINICAL DATA:  68 year old male with HIV presenting with emesis, diarrhea, weakness, acute renal failure and abnormal lipase. Initial encounter.  EXAM: ULTRASOUND ABDOMEN COMPLETE  COMPARISON:  CT Abdomen and Pelvis without contrast 03/22/2014. Abdomen ultrasound 11/18/2013.  FINDINGS: Gallbladder:  Wall thickening stable to decreased since April. No echogenic stones or sludge. No sonographic Murphy sign elicited.   Common bile duct:  Diameter: 6-8 mm diameter, upper limits of normal to mildly dilated (was 7 mm in April).  Liver:  Increased echogenicity, stable. No discrete liver lesion. No intrahepatic ductal dilatation identified.  IVC:  Incompletely visualized due to overlying bowel gas, visualized portions within normal limits.  Pancreas:  Incompletely visualized due to overlying bowel gas, visualized portions within normal limits.  Spleen:  Size and appearance within normal limits.  Right Kidney:  Length: 11.6 cm. Mildly echogenic. No hydronephrosis or renal mass.  Left Kidney:  Length: 11.6 cm. Mildly echogenic. No hydronephrosis or left renal mass.  Abdominal aorta:  Incompletely visualized due to overlying bowel gas, visualized portions within normal limits.  Other findings:  No ascites identified.  IMPRESSION: 1. Visualized pancreas has a normal sonographic appearance. 2. No biliary sludge or gallstones. Upper limits of normal to mildly dilated CBD, the similar to the abdomen ultrasound in April. No intrahepatic biliary ductal dilatation identified. 3. Resolved small volume ascites. 4. Mildly echogenic kidneys, could reflect chronic medical renal disease or less likely developing HIV nephropathy.   Electronically Signed   By: Lars Pinks M.D.   On: 03/23/2014 16:09   Mr Abdomen Mrcp Wo Cm  03/30/2014   CLINICAL DATA:  Pancreatitis. Mild biliary ductal dilatation seen on ultrasound. HIV.  EXAM: MRI ABDOMEN WITHOUT  (INCLUDING MRCP)  TECHNIQUE: Multiplanar multisequence MR imaging of the abdomen was performed. Heavily T2-weighted images of the biliary and pancreatic ducts were obtained, and three-dimensional MRCP images were rendered by post processing.  COMPARISON:  CT on 03/22/14  FINDINGS: Liver: No mass identified. Diffuse T2 hypointensity of the hepatic parenchyma is consistent with transfusional siderosis.  Gallbladder/Biliary: Gallbladder is unremarkable. No evidence of biliary ductal dilatation with common bile  duct measuring 6 mm in diameter. No evidence of choledocholithiasis.  Pancreas: Diffuse pancreatic swelling is seen with mild peripancreatic edema, consistent with acute pancreatitis. No evidence of pancreatic mass. No evidence of pancreatic ductal dilatation or pancreas divisum.  Spleen: No evidence of splenomegaly. 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  Adrenal Glands:  No mass identified.  Kidneys: No masses identified. No evidence of hydronephrosis. Diffuse T2 hyperintensity suspicious for HIV nephropathy or other medical renal disease.  Lymph Nodes:  No pathologically enlarged lymph nodes identified.  Bowel: Mild diffuse gastric wall thickening, likely secondary to pancreatitis.  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  Musculoskeletal:  No suspicious bone lesions identified.  Other:  Small bilateral pleural effusions noted.  IMPRESSION: Mild acute pancreatitis. No evidence of pancreatic mass, pseudocysts, or pancreatic ductal dilatation.  No evidence of biliary ductal dilatation or choledocholithiasis.  Findings consistent with transfusional siderosis, and HIV nephropathy or other medical renal disease.   Electronically Signed   By: Earle Gell M.D.   On: 03/30/2014 08:23   Mr 3d Recon At Scanner  03/30/2014   CLINICAL DATA:  Pancreatitis. Mild biliary ductal dilatation seen on ultrasound. HIV.  EXAM: MRI ABDOMEN WITHOUT  (  INCLUDING MRCP)  TECHNIQUE: Multiplanar multisequence MR imaging of the abdomen was performed. Heavily T2-weighted images of the biliary and pancreatic ducts were obtained, and three-dimensional MRCP images were rendered by post processing.  COMPARISON:  CT on 03/22/14  FINDINGS: Liver: No mass identified. Diffuse T2 hypointensity of the hepatic parenchyma is consistent with transfusional siderosis.  Gallbladder/Biliary: Gallbladder is unremarkable. No evidence of biliary ductal dilatation with common bile duct measuring 6 mm  in diameter. No evidence of choledocholithiasis.  Pancreas: Diffuse pancreatic swelling is seen with mild peripancreatic edema, consistent with acute pancreatitis. No evidence of pancreatic mass. No evidence of pancreatic ductal dilatation or pancreas divisum.  Spleen: No evidence of splenomegaly. 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  Adrenal Glands:  No mass identified.  Kidneys: No masses identified. No evidence of hydronephrosis. Diffuse T2 hyperintensity suspicious for HIV nephropathy or other medical renal disease.  Lymph Nodes:  No pathologically enlarged lymph nodes identified.  Bowel: Mild diffuse gastric wall thickening, likely secondary to pancreatitis.  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  Musculoskeletal:  No suspicious bone lesions identified.  Other:  Small bilateral pleural effusions noted.  IMPRESSION: Mild acute pancreatitis. No evidence of pancreatic mass, pseudocysts, or pancreatic ductal dilatation.  No evidence of biliary ductal dilatation or choledocholithiasis.  Findings consistent with transfusional siderosis, and HIV nephropathy or other medical renal disease.   Electronically Signed   By: Earle Gell M.D.   On: 03/30/2014 08:23   Ir Fluoro Guide Cv Line Right  04/03/2014   CLINICAL DATA:  Renal failure.  EXAM: TUNNELED CENTRAL VENOUS HEMODIALYSIS CATHETER PLACEMENT WITH ULTRASOUND AND FLUOROSCOPIC GUIDANCE  ANESTHESIA/SEDATION: 0.5 mg IV Versed; 25 mcg IV Fentanyl.  Total Moderate Sedation Time  45 minutes.  MEDICATIONS: 2 g IV Ancef. As antibiotic prophylaxis, Ancef was ordered pre-procedure and administered intravenously within one hour of incision.  FLUOROSCOPY TIME:  1 min and 36 seconds.  PROCEDURE: The procedure, risks, benefits, and alternatives were explained to the patient. Questions regarding the procedure were encouraged and answered. The patient understands and consents to the procedure.  The right neck and  chest were prepped with chlorhexidine in a sterile fashion, and a sterile drape was applied covering the operative field. Maximum barrier sterile technique with sterile gowns and gloves were used for the procedure. Local anesthesia was provided with 1% lidocaine.  Ultrasound was used to confirm patency of the right internal jugular vein. After creating a small venotomy incision, a 21 gauge needle was advanced into the right internal jugular vein under direct, real-time ultrasound guidance. Ultrasound image documentation was performed. After securing guidewire access, an 8 Fr dilator was placed. A J-wire was kinked to measure appropriate catheter length.  A Bard Hemosplit tunneled hemodialysis catheter measuring 23 cm from tip to cuff was chosen for placement. This was tunneled in a retrograde fashion from the chest wall to the venotomy incision.  At the venotomy, serial dilatation was performed and a 16 Fr peel-away sheath was placed over a guidewire. The catheter was then placed through the sheath and the sheath removed. Final catheter positioning was confirmed and documented with a fluoroscopic spot image. The catheter was aspirated, flushed with saline, and injected with appropriate volume heparin dwells.  The venotomy incision was closed with subcutaneous 3-0 Monocryl and subcuticular 4-0 Vicryl. Dermabond was applied to the incision. The catheter exit site was secured with 0-Prolene retention sutures.  COMPLICATIONS: None.  No pneumothorax.  FINDINGS: After catheter placement, the tips  lie in the right atrium. The catheter aspirates normally and is ready for immediate use.  IMPRESSION: Placement of tunneled hemodialysis catheter via the right internal jugular vein. The catheter tips lie in the right atrium. The catheter is ready for immediate use.   Electronically Signed   By: Aletta Edouard M.D.   On: 04/03/2014 14:14   Ir US Guide Vasc Access Right  04/03/2014   CLINICAL DATA:  Renal failure.  EXAM:  TUNNELED CENTRAL VENOUS HEMODIALYSIS CATHETER PLACEMENT WITH ULTRASOUND AND FLUOROSCOPIC GUIDANCE  ANESTHESIA/SEDATION: 0.5 mg IV Versed; 25 mcg IV Fentanyl.  Total Moderate Sedation Time  45 minutes.  MEDICATIONS: 2 g IV Ancef. As antibiotic prophylaxis, Ancef was ordered pre-procedure and administered intravenously within one hour of incision.  FLUOROSCOPY TIME:  1 min and 36 seconds.  PROCEDURE: The procedure, risks, benefits, and alternatives were explained to the patient. Questions regarding the procedure were encouraged and answered. The patient understands and consents to the procedure.  The right neck and chest were prepped with chlorhexidine in a sterile fashion, and a sterile drape was applied covering the operative field. Maximum barrier sterile technique with sterile gowns and gloves were used for the procedure. Local anesthesia was provided with 1% lidocaine.  Ultrasound was used to confirm patency of the right internal jugular vein. After creating a small venotomy incision, a 21 gauge needle was advanced into the right internal jugular vein under direct, real-time ultrasound guidance. Ultrasound image documentation was performed. After securing guidewire access, an 8 Fr dilator was placed. A J-wire was kinked to measure appropriate catheter length.  A Bard Hemosplit tunneled hemodialysis catheter measuring 23 cm from tip to cuff was chosen for placement. This was tunneled in a retrograde fashion from the chest wall to the venotomy incision.  At the venotomy, serial dilatation was performed and a 16 Fr peel-away sheath was placed over a guidewire. The catheter was then placed through the sheath and the sheath removed. Final catheter positioning was confirmed and documented with a fluoroscopic spot image. The catheter was aspirated, flushed with saline, and injected with appropriate volume heparin dwells.  The venotomy incision was closed with subcutaneous 3-0 Monocryl and subcuticular 4-0 Vicryl.  Dermabond was applied to the incision. The catheter exit site was secured with 0-Prolene retention sutures.  COMPLICATIONS: None.  No pneumothorax.  FINDINGS: After catheter placement, the tips lie in the right atrium. The catheter aspirates normally and is ready for immediate use.  IMPRESSION: Placement of tunneled hemodialysis catheter via the right internal jugular vein. The catheter tips lie in the right atrium. The catheter is ready for immediate use.   Electronically Signed   By: Aletta Edouard M.D.   On: 04/03/2014 14:14   Dg Chest Portable 1 View  03/27/2014   CLINICAL DATA:  Respiratory failure.  EXAM: PORTABLE CHEST - 1 VIEW  COMPARISON:  03/25/2014.  FINDINGS: The heart is mildly enlarged but stable. Stable surgical changes from triple bypass surgery. Persistent asymmetric airspace process, most likely bilateral infiltrates. A small left pleural effusion is suspected.  IMPRESSION: Persistent asymmetric airspace process, likely bilateral infiltrates.   Electronically Signed   By: Kalman Jewels M.D.   On: 03/27/2014 07:31   Dg Chest Port 1 View  03/25/2014   CLINICAL DATA:  Respiratory failure  EXAM: PORTABLE CHEST - 1 VIEW  COMPARISON:  Prior chest x-ray 03/24/2014  FINDINGS: Fluctuating patchy bilateral airspace opacities. Slightly increased confluence of opacity in the left retrocardiac and left upper lung regions. The  right upper and lower lobe opacities are similar. There are small bilateral layering pleural effusions. No pneumothorax. Stable borderline cardiomegaly. Patient is status post median sternotomy with evidence of prior multivessel CABG. Left IJ approach central venous catheter. Catheter tip in good position at the superior cavoatrial junction. Inspiratory volumes are low. No acute osseous abnormality.  IMPRESSION: 1. Asymmetric bilateral interstitial and airspace disease. Differential considerations include asymmetric pulmonary edema and multifocal pneumonia/pneumonitis. The  asymmetry of the opacities is more conspicuous on today's examination with worse airspace disease on the left compared to the right. 2. Probable small bilateral layering pleural effusions.   Electronically Signed   By: Jacqulynn Cadet M.D.   On: 03/25/2014 08:21   Dg Chest Port 1 View  03/24/2014   CLINICAL DATA:  Difficulty breathing  EXAM: PORTABLE CHEST - 1 VIEW  COMPARISON:  03/23/2014  FINDINGS: Unchanged cardiomegaly.  The patient is status post CABG.  Perihilar interstitial and airspace opacities persist. There are small bilateral pleural effusions.  Unchanged positioning of left IJ catheter, tip at the level of the right atrium. No pneumothorax.  IMPRESSION: 1. Unchanged pulmonary edema with small pleural effusions. 2. Left IJ catheter, tip at the right atrium.   Electronically Signed   By: Jorje Guild M.D.   On: 03/24/2014 05:27   Dg Chest Portable 1 View  03/23/2014   CLINICAL DATA:  Left IJ catheter placement.  EXAM: PORTABLE CHEST - 1 VIEW  COMPARISON:  PA and lateral chest 10/18/2012.  FINDINGS: A new left IJ catheter is in place with the tip in the right atrium. Recommend withdrawal of 5.5 cm. Mild interstitial edema is present. There is no pneumothorax. Heart size is upper normal.  IMPRESSION: Tip of left IJ catheter is in the right atrium. Recommend withdrawal of 5.5 cm. Negative for pneumothorax.  Interstitial edema.  These results will be called to the ordering clinician or representative by the Radiologist Assistant, and communication documented in the PACS or zVision Dashboard.   Electronically Signed   By: Inge Rise M.D.   On: 03/23/2014 13:08   Dg Abd Portable 1v  03/24/2014   CLINICAL DATA:  Abdominal pain  EXAM: PORTABLE ABDOMEN - 1 VIEW  COMPARISON:  Abdominal CT 03/22/2014  FINDINGS: There is diffuse mild gaseous distension of small bowel, measuring up to 3.5 cm diameter. No definite wall thickening. There is a relative paucity of colonic gas, although transverse colon  gas is present. Lung bases are clear. No concerning intra-abdominal mass effect.  IMPRESSION: Gaseous distention of small bowel. Given no obstruction present on recent abdominal CT, this likely reflects mild ileus or enteritis.   Electronically Signed   By: Jorje Guild M.D.   On: 03/24/2014 05:35     Today   Subjective:   [name] today has no headache,no chest abdominal pain,no new weakness tingling or numbness, feels much better wants to go home today.   Objective:   Blood pressure 121/65, pulse 57, temperature 97.9 F (36.6 C), temperature source Oral, resp. rate 17, height _0  (1.6 m), weight 51.3 kg (113 lb 1.5 oz), SpO2 100.00%.  Intake/Output Summary (Last 24 hours) at 04/10/14 1541 Last data filed at 04/10/14 1024  Gross per 24 hour  Intake    240 ml  Output    300 ml  Net    -60 ml    Exam Awake Alert, Oriented *3, No new F.N deficits, Normal affect [place].AT,PERRAL Supple Neck,No JVD, No cervical lymphadenopathy appriciated.  Symmetrical Chest  wall movement, Good air movement bilaterally, CTAB RRR,No Gallops,Rubs or new Murmurs, No Parasternal Heave +ve B.Sounds, Abd Soft, Non tender, No organomegaly appriciated, No rebound -guarding or rigidity. No Cyanosis, Clubbing or edema, No new Rash or bruise  Data Review     CBC w Diff: Lab Results  Component Value Date   WBC 2.5* 04/09/2014   HGB 7.3* 04/09/2014   HCT 21.5* 04/09/2014   PLT 138* 04/09/2014   LYMPHOPCT 3* 03/25/2014   MONOPCT 7 03/25/2014   EOSPCT 0 03/25/2014   BASOPCT 0 03/25/2014   CMP: Lab Results  Component Value Date   NA 140 04/10/2014   K 4.3 04/10/2014   CL 100 04/10/2014   CO2 22 04/10/2014   BUN 85* 04/10/2014   CREATININE 7.07* 04/10/2014   CREATININE 7.12* 03/22/2014   PROT 5.9* 03/28/2014   ALBUMIN 2.3* 04/07/2014   BILITOT 0.5 03/28/2014   ALKPHOS 61 03/28/2014   AST 22 03/28/2014   ALT 12 03/28/2014  .  Micro Results Recent Results (from the past 240 hour(s))  SURGICAL PCR SCREEN     Status:  None   Collection Time    04/02/14  9:30 PM      Result Value Ref Range Status   MRSA, PCR NEGATIVE  NEGATIVE Final   Staphylococcus aureus NEGATIVE  NEGATIVE Final   Comment:            The Xpert SA Assay (FDA     approved for NASAL specimens     in patients over 48 years of age),     is one component of     a comprehensive surveillance     program.  Test performance has     been validated by [employer] for patients greater     than or equal to 38 year old.     It is not intended     to diagnose infection nor to     guide or monitor treatment.        Follow-up Information   Follow up with Penni Homans, MD.   Specialty:  Family Medicine   Contact information:   [address] [address] [place] [address] [phone]       Follow up with Milus Banister, MD. Schedule an appointment as soon as possible for a visit in 6 weeks. (post hospitalization follow up)    Specialty:  Gastroenterology   Contact information:   [address] [place] [address] [phone]       Follow up with Estanislado Emms, MD. Schedule an appointment as soon as possible for a visit in 1 week.   Specialty:  Nephrology   Contact information:   [place] [address] [phone]       Discharge Medications     Medication List    STOP taking these medications       atenolol 50 MG tablet  Commonly known as:  TENORMIN     fenofibrate 160 MG tablet     FIBER SUPREME PO     lisinopril-hydrochlorothiazide 20-12.5 MG per tablet  Commonly known as:  PRINZIDE,ZESTORETIC     metFORMIN 1000 MG tablet  Commonly known as:  GLUCOPHAGE     ondansetron 8 MG disintegrating tablet  Commonly known as:  ZOFRAN-ODT     STRIBILD  150-150-200-300 MG Tabs tablet  Generic drug:  elvitegravir-cobicistat-emtricitabine-tenofovir      TAKE these medications       amLODipine 10 MG tablet  Commonly known as:  NORVASC  Take 1 tablet (10 mg total) by  mouth at bedtime.     aspirin 81 MG chewable tablet  Chew 81 mg by mouth daily.     atorvastatin 10 MG tablet  Commonly known as:  LIPITOR  Take 10 mg by mouth daily.     b complex vitamins tablet  Take 1 tablet by mouth daily.     doxazosin 1 MG tablet  Commonly known as:  CARDURA  Take 1 tablet (1 mg total) by mouth at bedtime.     freestyle lancets  USE TO CHECK BLOOD SUGAR EVERY DAY     glipiZIDE 2.5 mg Tabs tablet  Commonly known as:  GLUCOTROL  Take 0.5 tablets (2.5 mg total) by mouth daily before breakfast.     GLUCERNA Liqd  Take 1 Can by mouth 2 (two) times daily between meals.     HYDROcodone-acetaminophen 5-325 MG per tablet  Commonly known as:  NORCO/VICODIN  Take 1-2 tablets by mouth every 6 (six) hours as needed for moderate pain. Take one tablet when pain starts then may take another one if pain continues     KRILL OIL OMEGA-3 PO  Take 1 capsule by mouth daily.     omeprazole 20 MG capsule  Commonly known as:  PRILOSEC  Take 20 mg by mouth daily.     Pancrelipase (Lip-Prot-Amyl) 24000 UNITS Cpep  Take 1 capsule by mouth 3 (three) times daily.     PROBIOTIC DAILY PO  Take 1 tablet by mouth daily.     valACYclovir 500 MG tablet  Commonly known as:  VALTREX  Take 1 tablet (500 mg total) by mouth daily.         Total Time in preparing paper work, data evaluation and todays exam - 35 minutes  Starr Urias M.D on 04/10/2014 at 3:41 PM  [facility]  [phone]

## 2014-04-10 NOTE — Progress Notes (Signed)
Patient Discharge:  Disposition: home  Education: educated patient on discharge medications, follow-up appointments, and discharge instructions.  Educated patient on kidney injury, signs and symptoms, and when to call a doctor.  Patient verbalized understanding.  AVS signed.    IV: removed.  Clean/dry/intact  Telemetry: n/a, previously removed   Follow-up appointments: Educated patient on follow-up appointments.  Unable to make appointments for patient due to holiday, but patient verbalized understanding of how to make appointments.  Phone numbers printed with AVS.  Patient verbalized understanding that he would have labwork drawn Mon/Thursdays through [facility].    Prescriptions:  Glipizide prescription hard copy given to patient.  Other prescriptions sent electronically to [facility].    Transportation:  Escorted via wheelchair to ride with NT.    Belongings: Gathered by patient.

## 2014-04-10 NOTE — Progress Notes (Signed)
1. AKI, recovery phase is slow but is trending in the right direction, feels well.   Is anxious to go home and I do feel we can follow as OP- is OK to discharge with PC in- I will arrange labs thru [facility] on Thursday and Monday and to follow up with Dr. Florene Glen.  We can get PC out as OP 2. HIV- per ID 3. HTN- blood pressure improved  4. Tunneled IJ cath   Subjective: Interval History: copious UOP  Objective: Vital signs in last 24 hours: Temp:  [97.9 F (36.6 C)-98.5 F (36.9 C)] 97.9 F (36.6 C) (09/07 0950) Pulse Rate:  [57-66] 57 (09/07 0950) Resp:  [16-18] 17 (09/07 0950) BP: (121-136)/(60-68) 121/65 mmHg (09/07 0950) SpO2:  [99 %-100 %] 100 % (09/07 0950) Weight:  [51.2 kg (112 lb 14 oz)-51.3 kg (113 lb 1.5 oz)] 51.3 kg (113 lb 1.5 oz) (09/07 0500) Weight change: 0.669 kg (1 lb 7.6 oz)  Intake/Output from previous day: 09/06 0701 - 09/07 0700 In: 600 [P.O.:600] Out: 250 [Urine:250] Intake/Output this shift: Total I/O In: 240 [P.O.:240] Out: 300 [Urine:300]  General appearance: alert and cooperative Back: negative, symmetric, no curvature. ROM normal. No CVA tenderness. Resp: clear to auscultation bilaterally Cardio: regular rate and rhythm, S1, S2 normal, no murmur, click, rub or gallop Extremities: extremities normal, atraumatic, no cyanosis or edema  Lab Results:  Recent Labs  04/08/14 0413 04/09/14 0410  WBC 4.3 2.5*  HGB 7.9* 7.3*  HCT 22.3* 21.5*  PLT 129* 138*   BMET:   Recent Labs  04/09/14 0410 04/10/14 0834  NA 139 140  K 4.0 4.3  CL 100 100  CO2 22 22  GLUCOSE 129* 130*  BUN 86* 85*  CREATININE 7.11* 7.07*  CALCIUM 8.1* 8.7   No results found for this basename: PTH,  in the last 72 hours Iron Studies: No results found for this basename: IRON, TIBC, TRANSFERRIN, FERRITIN,  in the last 72 hours Studies/Results: No results found.  Scheduled: . amLODipine  10 mg Oral QHS  . atenolol  50 mg Oral Daily  . chlorhexidine  15 mL  Mouth Rinse BID  . doxazosin  1 mg Oral QHS  . insulin aspart  0-15 Units Subcutaneous TID WC  . insulin aspart  0-5 Units Subcutaneous QHS  . insulin aspart  3 Units Subcutaneous TID WC  . pantoprazole  40 mg Oral QHS     LOS: 19 days   [name] A 04/10/2014,3:04 PM

## 2014-04-11 ENCOUNTER — Encounter: Payer: Self-pay | Admitting: Physician Assistant

## 2014-04-11 ENCOUNTER — Ambulatory Visit (INDEPENDENT_AMBULATORY_CARE_PROVIDER_SITE_OTHER): Payer: Medicare Other | Admitting: Physician Assistant

## 2014-04-11 VITALS — BP 151/70 | HR 60 | Temp 97.9°F | Wt 115.0 lb

## 2014-04-11 DIAGNOSIS — E872 Acidosis, unspecified: Secondary | ICD-10-CM

## 2014-04-11 DIAGNOSIS — N179 Acute kidney failure, unspecified: Secondary | ICD-10-CM

## 2014-04-11 DIAGNOSIS — K858 Other acute pancreatitis without necrosis or infection: Secondary | ICD-10-CM

## 2014-04-11 DIAGNOSIS — K859 Acute pancreatitis without necrosis or infection, unspecified: Secondary | ICD-10-CM

## 2014-04-11 DIAGNOSIS — B20 Human immunodeficiency virus [HIV] disease: Secondary | ICD-10-CM

## 2014-04-11 DIAGNOSIS — I10 Essential (primary) hypertension: Secondary | ICD-10-CM

## 2014-04-11 DIAGNOSIS — I2510 Atherosclerotic heart disease of native coronary artery without angina pectoris: Secondary | ICD-10-CM

## 2014-04-11 DIAGNOSIS — J9600 Acute respiratory failure, unspecified whether with hypoxia or hypercapnia: Secondary | ICD-10-CM

## 2014-04-11 MED ORDER — AMLODIPINE BESYLATE 10 MG PO TABS: 10.0000 mg | ORAL_TABLET | Freq: Every day | ORAL | Status: DC

## 2014-04-11 NOTE — Patient Instructions (Signed)
Please continue medications as directed.  I would like for you to continue the Glipizide 2.5 mg daily.  Remember to not skip meals.  Please pick up the amlodipine and begin taking 10 mg daily as directed.  I would hold off on the Cardura for now.  Follow-up with Dr. Charlett Blake in 1 week.  If BP still slightly elevated with new dose of Amlodipine, we can start the Cardura.  Please go to the lab.  I will call you with your results.  Schedule an appointment with your Infectious Disease MD and your Gastroenterologist.  I have called to help schedule an appointment with your Nephrologist, Dr. Florene Glen.  I could not reach anyone but have instructed them to call you at home with an appointment day/time.  They have been instructed to contact me personally if they cannot get in touch with you.  They are aware you need to be seen within the next week.  Again, follow-up with myself of Dr. Charlett Blake in 1 week.  Return sooner if needed.

## 2014-04-11 NOTE — Progress Notes (Signed)
Pre visit review using our clinic review tool, if applicable. No additional management support is needed unless otherwise documented below in the visit note. 

## 2014-04-12 ENCOUNTER — Telehealth: Payer: Self-pay | Admitting: Gastroenterology

## 2014-04-12 ENCOUNTER — Telehealth: Payer: Self-pay | Admitting: *Deleted

## 2014-04-12 LAB — CBC
HCT: 24 % — ABNORMAL LOW (ref 39.0–52.0)
MCHC: 34.6 g/dL (ref 30.0–36.0)
MCV: 96.4 fl (ref 78.0–100.0)
Platelets: 150 10*3/uL (ref 150.0–400.0)
RDW: 14.7 % (ref 11.5–15.5)
WBC: 3.2 10*3/uL — ABNORMAL LOW (ref 4.0–10.5)

## 2014-04-12 LAB — COMPREHENSIVE METABOLIC PANEL
ALT: 10 U/L (ref 0–53)
AST: 19 U/L (ref 0–37)
Albumin: 3.1 g/dL — ABNORMAL LOW (ref 3.5–5.2)
Alkaline Phosphatase: 75 U/L (ref 39–117)
BILIRUBIN TOTAL: 0.3 mg/dL (ref 0.2–1.2)
BUN: 85 mg/dL — AB (ref 6–23)
CO2: 21 mEq/L (ref 19–32)
CREATININE: 6.7 mg/dL — AB (ref 0.4–1.5)
Calcium: 8.4 mg/dL (ref 8.4–10.5)
Chloride: 102 mEq/L (ref 96–112)
GFR: 8.79 mL/min — CL (ref >60.00)
GLUCOSE: 168 mg/dL — AB (ref 70–99)
Potassium: 3.8 mEq/L (ref 3.5–5.1)
SODIUM: 137 meq/L (ref 135–145)
TOTAL PROTEIN: 6.4 g/dL (ref 6.0–8.3)

## 2014-04-12 LAB — LIPASE: LIPASE: 325 U/L — AB (ref 11.0–59.0)

## 2014-04-12 NOTE — Telephone Encounter (Addendum)
Notes Recorded by Brunetta Jeans, PA-C on 04/12/2014 at 1:03 PM Please call patient to see if he has been set up with his Nephrologist. This is extremely important as his kidney function is still highly impaired. If he has not heard from them today, let me know so that we can try to contact them again. It is Dr. Florene Glen with [facility] I believe.   FYI: Patient informed, understood & agreed; is in [place] and going to go by [facility] to schedule appt and call us back with that information/SLS

## 2014-04-13 ENCOUNTER — Other Ambulatory Visit: Payer: Self-pay | Admitting: Internal Medicine

## 2014-04-13 DIAGNOSIS — N179 Acute kidney failure, unspecified: Secondary | ICD-10-CM | POA: Diagnosis not present

## 2014-04-13 NOTE — Telephone Encounter (Signed)
Pt has been scheduled with extender for follow up from hospital admission

## 2014-04-13 NOTE — Progress Notes (Signed)
Baltazar Apo, MD, PhD 04/13/2014, 9:08 AM [facility] [phone] or if no answer [phone]

## 2014-04-17 ENCOUNTER — Ambulatory Visit: Payer: Self-pay | Admitting: Nurse Practitioner

## 2014-04-17 DIAGNOSIS — N179 Acute kidney failure, unspecified: Secondary | ICD-10-CM | POA: Diagnosis not present

## 2014-04-18 ENCOUNTER — Ambulatory Visit (INDEPENDENT_AMBULATORY_CARE_PROVIDER_SITE_OTHER): Payer: Medicare Other | Admitting: Physician Assistant

## 2014-04-18 ENCOUNTER — Encounter: Payer: Self-pay | Admitting: Physician Assistant

## 2014-04-18 VITALS — BP 137/75 | HR 76 | Temp 97.7°F | Resp 14 | Ht 63.0 in | Wt 110.0 lb

## 2014-04-18 DIAGNOSIS — J209 Acute bronchitis, unspecified: Secondary | ICD-10-CM

## 2014-04-18 DIAGNOSIS — I2510 Atherosclerotic heart disease of native coronary artery without angina pectoris: Secondary | ICD-10-CM

## 2014-04-18 DIAGNOSIS — A499 Bacterial infection, unspecified: Secondary | ICD-10-CM

## 2014-04-18 DIAGNOSIS — I10 Essential (primary) hypertension: Secondary | ICD-10-CM | POA: Diagnosis not present

## 2014-04-18 DIAGNOSIS — B9689 Other specified bacterial agents as the cause of diseases classified elsewhere: Secondary | ICD-10-CM

## 2014-04-18 DIAGNOSIS — J208 Acute bronchitis due to other specified organisms: Secondary | ICD-10-CM

## 2014-04-18 MED ORDER — AMOXICILLIN 500 MG PO TABS: 500.0000 mg | ORAL_TABLET | Freq: Every day | ORAL | Status: DC

## 2014-04-18 NOTE — Progress Notes (Signed)
Pre visit review using our clinic review tool, if applicable. No additional management support is needed unless otherwise documented below in the visit note/SLS  

## 2014-04-18 NOTE — Assessment & Plan Note (Signed)
Giving severe renal impairment will Rx Amoxicillin once daily.  Supportive measures discussed.  Follow-up if no improvement.

## 2014-04-18 NOTE — Assessment & Plan Note (Signed)
Improved.  Continue current regimen.  Follow-up with Nephrologist on Friday as scheduled.  Follow-up with Dr. Charlett Blake in 1 month.

## 2014-04-18 NOTE — Progress Notes (Signed)
Patient presents to clinic today for 1 week follow-up of hypertension.  Patient was seen last week for hospital follow-up for multiple issues.  BP was significantly elevated so patient's amlodipine was restarted at 10 mg daily.  Patient endorses taking medication as directed.  Endorses good BP measurements at home.  BP at 137/75 in clinic today.  States he feels well overall except for a cough that he has had x 2 weeks that has become productive of green sputum over the past few days.  Endorses some fatigue.  Denies chest pain, SOB or wheezing.  Patient has appointment scheduled with his Nephrologist this Friday for his acute on chronic renal insufficiency. Past Medical History  Diagnosis Date  . Arthritis   . Diabetes mellitus   . History of depression   . GERD (gastroesophageal reflux disease)   . CAD (coronary artery disease)     2003- cabg  . Hypertension   . Hyperlipidemia   . History of kidney stones   . COPD (chronic obstructive pulmonary disease)   . HIV (human immunodeficiency virus infection) 1991    on meds since initial dx.   . Dermatitis 11/27/2012  . Nocturia 03/06/2013  . Epicondylitis 06/05/2013    right  . Pancreatitis 11/2013    attributed to HIV meds.     Current Outpatient Prescriptions on File Prior to Visit  Medication Sig Dispense Refill  . amLODipine (NORVASC) 10 MG tablet Take 1 tablet (10 mg total) by mouth at bedtime.  30 tablet  0  . aspirin 81 MG chewable tablet Chew 81 mg by mouth daily.      Marland Kitchen atorvastatin (LIPITOR) 10 MG tablet Take 10 mg by mouth daily.      Marland Kitchen b complex vitamins tablet Take 1 tablet by mouth daily.      Marland Kitchen glipiZIDE (GLUCOTROL) 2.5 mg TABS tablet Take 0.5 tablets (2.5 mg total) by mouth daily before breakfast.  30 each  0  . HYDROcodone-acetaminophen (NORCO/VICODIN) 5-325 MG per tablet Take 1-2 tablets by mouth every 6 (six) hours as needed for moderate pain. Take one tablet when pain starts then may take another one if pain continues       . KRILL OIL OMEGA-3 PO Take 1 capsule by mouth daily.      . Lancets (FREESTYLE) lancets USE TO CHECK BLOOD SUGAR EVERY DAY  100 each  0  . omeprazole (PRILOSEC) 20 MG capsule Take 20 mg by mouth daily.      . Pancrelipase, Lip-Prot-Amyl, 24000 UNITS CPEP Take 1 capsule by mouth 3 (three) times daily.      . Probiotic Product (PROBIOTIC DAILY PO) Take 1 tablet by mouth daily.       . valACYclovir (VALTREX) 500 MG tablet TAKE 1 TABLET BY MOUTH TWICE DAILY  60 tablet  5   No current facility-administered medications on file prior to visit.    No Known Allergies  Family History  Problem Relation Age of Onset  . Arthritis      mother/father/paternal grandparents  . Breast cancer Maternal Aunt     paternal aunt  . Lung cancer Maternal Aunt   . Hyperlipidemia Mother   . Hyperlipidemia Father   . Heart disease      parents/maternal grandparents/ 2 brothers  . Stroke Paternal Grandmother   . Hypertension Father     paternal grandmother/3 brothers/1 sister  . Mental retardation Sister   . Diabetes Mother     paternal grandparents/1 brother    History  Social History  . Marital Status: Divorced    Spouse Name: N/A    Number of Children: 51  . Years of Education: N/A   Occupational History  . 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    Social History Main Topics  . Smoking status: Never Smoker   . Smokeless tobacco: Never Used  . Alcohol Use: No  . Drug Use: No  . Sexual Activity: Not Currently     Comment: declined condoms   Other Topics Concern  . None   Social History Narrative   Lives alone.  Supportive friends and family.  His HIV Dx is not a secret.    Review of Systems - See HPI.  All other ROS are negative.  BP 137/75  Pulse 76  Temp(Src) 97.7 F (36.5 C) (Oral)  Resp 14  Ht 5\' 3"  (1.6 m)  Wt 110 lb (49.896 kg)  BMI 19.49 kg/m2  SpO2 100%  Physical Exam  Vitals reviewed. Constitutional: He is oriented to person,  place, and time and well-developed, well-nourished, and in no distress.  HENT:  Head: Normocephalic and atraumatic.  Right Ear: External ear normal.  Left Ear: External ear normal.  Nose: Nose normal.  Mouth/Throat: Oropharynx is clear and moist. No oropharyngeal exudate.  TM within normal limits bilaterally.  Eyes: Conjunctivae are normal. Pupils are equal, round, and reactive to light.  Neck: Neck supple.  Cardiovascular: Normal rate, regular rhythm, normal heart sounds and intact distal pulses.   Pulmonary/Chest: Effort normal and breath sounds normal. No respiratory distress. He has no wheezes. He has no rales. He exhibits no tenderness.  Lymphadenopathy:    He has no cervical adenopathy.  Neurological: He is alert and oriented to person, place, and time.  Skin: Skin is warm and dry. No rash noted.  Psychiatric: Affect normal.   Assessment/Plan: HYPERTENSION Improved.  Continue current regimen.  Follow-up with Nephrologist on Friday as scheduled.  Follow-up with Dr. Charlett Blake in 1 month.  Acute bacterial bronchitis Giving severe renal impairment will Rx Amoxicillin once daily.  Supportive measures discussed.  Follow-up if no improvement.

## 2014-04-18 NOTE — Patient Instructions (Signed)
Please take antibiotic (Amoxicillin) once daily for 10 days.  Stay well hydrated.  Place a humidifier in the bedroom.  Follow-up with your Nephrologist on Friday.  Take all medications to that appointment so he can alter your antibiotic if needed giving your kidney function.  Follow-up with Dr. Charlett Blake in 1 month.

## 2014-04-21 DIAGNOSIS — E785 Hyperlipidemia, unspecified: Secondary | ICD-10-CM | POA: Diagnosis not present

## 2014-04-21 DIAGNOSIS — N179 Acute kidney failure, unspecified: Secondary | ICD-10-CM | POA: Diagnosis not present

## 2014-04-21 DIAGNOSIS — Z21 Asymptomatic human immunodeficiency virus [HIV] infection status: Secondary | ICD-10-CM | POA: Diagnosis not present

## 2014-04-21 DIAGNOSIS — I10 Essential (primary) hypertension: Secondary | ICD-10-CM | POA: Diagnosis not present

## 2014-04-25 DIAGNOSIS — N179 Acute kidney failure, unspecified: Secondary | ICD-10-CM | POA: Diagnosis not present

## 2014-04-26 DIAGNOSIS — N186 End stage renal disease: Secondary | ICD-10-CM | POA: Diagnosis not present

## 2014-04-26 DIAGNOSIS — Z452 Encounter for adjustment and management of vascular access device: Secondary | ICD-10-CM | POA: Diagnosis not present

## 2014-04-27 ENCOUNTER — Encounter: Payer: Self-pay | Admitting: Family Medicine

## 2014-04-28 ENCOUNTER — Ambulatory Visit (INDEPENDENT_AMBULATORY_CARE_PROVIDER_SITE_OTHER): Payer: Medicare Other | Admitting: Nurse Practitioner

## 2014-04-28 ENCOUNTER — Encounter: Payer: Self-pay | Admitting: Nurse Practitioner

## 2014-04-28 VITALS — BP 126/62 | HR 72 | Ht 63.0 in | Wt 111.0 lb

## 2014-04-28 DIAGNOSIS — K858 Other acute pancreatitis without necrosis or infection: Secondary | ICD-10-CM

## 2014-04-28 DIAGNOSIS — K859 Acute pancreatitis without necrosis or infection, unspecified: Secondary | ICD-10-CM | POA: Diagnosis not present

## 2014-04-28 NOTE — Patient Instructions (Signed)
Please follow up as needed or if symptoms return

## 2014-04-28 NOTE — Progress Notes (Signed)
     History of Present Illness:   68 year old male known to Dr. Ardis Hughs. I am seeing him for the first time today. Patient was recently admitted with ARF, vomiting and lipase >3000. CTscan revealed a distended stomach, pancreas was unremarkable. Ultrasound negative for gallstones. CBD was upper limites of normal, similar to April's ultrasound. In the absence of abdominal pain we were not convinced patient had pancreatitis but then MRCP did show mild pancreatitis.  Etiology was unclear but felt to be secondary to HIV meds. Patient's HIV meds are on hold, he has a follow up appt with ID next week.      Current Medications, Allergies, Past Medical History, Past Surgical History, Family History and Social History were reviewed in [employer] record.    Physical Exam: General: Pleasant, well developed , black male in no acute distress Head: Normocephalic and atraumatic Eyes:  sclerae anicteric, conjunctiva pink  Ears: Normal auditory acuity Lungs: Clear throughout to auscultation Heart: Regular rate and rhythm Abdomen: Soft, non distended, non-tender. No masses, no hepatomegaly. Normal bowel sounds Musculoskeletal: Symmetrical with no gross deformities  Extremities: No edema  Neurological: Alert oriented x 4, grossly nonfocal Psychological:  Alert and cooperative. Normal mood and affect  Assessment and Recommendations:  77 68 year old male with recent acute, mild pancreatitis felt to be secondary to HIV meds. He seems to have fully recovered. No further vomiting, tolerating solids. He never did have abdominal pain. Patient has follow up with ID next week. As for now his HIV meds are still on hold  2. AKI, slowly improving per patient. Followed by Dr. Posey Pronto. Cr down to 4.5 (per patient).

## 2014-04-29 ENCOUNTER — Ambulatory Visit (INDEPENDENT_AMBULATORY_CARE_PROVIDER_SITE_OTHER): Payer: Medicare Other | Admitting: Family Medicine

## 2014-04-29 ENCOUNTER — Encounter: Payer: Self-pay | Admitting: Family Medicine

## 2014-04-29 VITALS — BP 120/68 | Temp 98.2°F | Wt 112.0 lb

## 2014-04-29 DIAGNOSIS — B9689 Other specified bacterial agents as the cause of diseases classified elsewhere: Secondary | ICD-10-CM

## 2014-04-29 DIAGNOSIS — B20 Human immunodeficiency virus [HIV] disease: Secondary | ICD-10-CM

## 2014-04-29 DIAGNOSIS — E119 Type 2 diabetes mellitus without complications: Secondary | ICD-10-CM | POA: Diagnosis not present

## 2014-04-29 DIAGNOSIS — N179 Acute kidney failure, unspecified: Secondary | ICD-10-CM

## 2014-04-29 DIAGNOSIS — J209 Acute bronchitis, unspecified: Secondary | ICD-10-CM

## 2014-04-29 DIAGNOSIS — I2510 Atherosclerotic heart disease of native coronary artery without angina pectoris: Secondary | ICD-10-CM

## 2014-04-29 DIAGNOSIS — A499 Bacterial infection, unspecified: Secondary | ICD-10-CM

## 2014-04-29 DIAGNOSIS — J208 Acute bronchitis due to other specified organisms: Principal | ICD-10-CM

## 2014-04-29 MED ORDER — AZITHROMYCIN 250 MG PO TABS: ORAL_TABLET | ORAL | Status: DC

## 2014-04-29 NOTE — Progress Notes (Signed)
BP 120/68  Temp(Src) 98.2 F (36.8 C)  Wt 112 lb (50.803 kg)   CC: cough  Subjective:    Patient ID: [name], male    DOB: 07/15/1946, 68 y.o.   MRN: [id number]  HPI: [name] is a 68 y.o. male presenting on 04/29/2014 for Cough   H/o HIV, T2DM, severe acute renal failure in setting of pancreatitis s/p hospitalization in the past month (sees Dr. Posey Pronto) - states kidneys are improving, last Cr 4.5 per patient. Has portacath in place. Started seeing Dr. Posey Pronto this month.  Head and ear congestion ongoing for last 2+ week along with coughing up yellow phlegm.  Treated with 10 d course of amoxicillin 500mg  once daily. Mild PNdrainage.  No fevers/chills. No abd pain, tooth pain, sore throat, HA. No dyspnea. No sick contacts at home. No smokers at home. No h/o asthma.  Lab Results  Component Value Date   CREATININE 6.7* 04/11/2014  last Cr per patient was 4.5 at nephrologist's office  Has f/u appt with ID on Thursday. Off HIV med (stribild) since hospitalization.   Lab Results  Component Value Date   HGBA1C 7.5* 02/20/2014   Lab Results  Component Value Date   CD4TCELL 46 01/26/2014   CD4TABS 320* 01/26/2014    Past Medical History  Diagnosis Date  . Arthritis   . Diabetes mellitus   . History of depression   . GERD (gastroesophageal reflux disease)   . CAD (coronary artery disease)     2003- cabg  . Hypertension   . Hyperlipidemia   . History of kidney stones   . COPD (chronic obstructive pulmonary disease)   . HIV (human immunodeficiency virus infection) 1991    on meds since initial dx.   . Dermatitis 11/27/2012  . Nocturia 03/06/2013  . Epicondylitis 06/05/2013    right  . Pancreatitis 11/2013    attributed to HIV meds.     Relevant past medical, surgical, family and social history reviewed and updated as indicated.  Allergies and medications reviewed and updated. Current Outpatient Prescriptions on File Prior to Visit  Medication Sig  . amLODipine  (NORVASC) 10 MG tablet Take 1 tablet (10 mg total) by mouth at bedtime.  Marland Kitchen amoxicillin (AMOXIL) 500 MG tablet Take 1 tablet (500 mg total) by mouth daily.  Marland Kitchen aspirin 81 MG chewable tablet Chew 81 mg by mouth daily.  Marland Kitchen atorvastatin (LIPITOR) 10 MG tablet Take 10 mg by mouth daily.  Marland Kitchen b complex vitamins tablet Take 1 tablet by mouth daily.  Marland Kitchen glipiZIDE (GLUCOTROL) 2.5 mg TABS tablet Take 0.5 tablets (2.5 mg total) by mouth daily before breakfast.  . KRILL OIL OMEGA-3 PO Take 1 capsule by mouth daily.  . Lancets (FREESTYLE) lancets USE TO CHECK BLOOD SUGAR EVERY DAY  . omeprazole (PRILOSEC) 20 MG capsule Take 20 mg by mouth daily.  . Probiotic Product (PROBIOTIC DAILY PO) Take 1 tablet by mouth daily.   . sodium bicarbonate 650 MG tablet Take 650 mg by mouth 3 (three) times daily.  Marland Kitchen HYDROcodone-acetaminophen (NORCO/VICODIN) 5-325 MG per tablet Take 1-2 tablets by mouth every 6 (six) hours as needed for moderate pain. Take one tablet when pain starts then may take another one if pain continues  . valACYclovir (VALTREX) 500 MG tablet TAKE 1 TABLET BY MOUTH TWICE DAILY   No current facility-administered medications on file prior to visit.    Review of Systems Per HPI unless specifically indicated above    Objective:  BP 120/68  Temp(Src) 98.2 F (36.8 C)  Wt 112 lb (50.803 kg)  Physical Exam  Nursing note and vitals reviewed. Constitutional: He appears well-developed and well-nourished. No distress.  HENT:  Head: Normocephalic and atraumatic.  Right Ear: Hearing, tympanic membrane, external ear and ear canal normal.  Left Ear: Hearing, tympanic membrane, external ear and ear canal normal.  Nose: No mucosal edema or rhinorrhea. Right sinus exhibits no maxillary sinus tenderness and no frontal sinus tenderness. Left sinus exhibits no maxillary sinus tenderness and no frontal sinus tenderness.  Mouth/Throat: Uvula is midline, oropharynx is clear and moist and mucous membranes are normal.  No oropharyngeal exudate, posterior oropharyngeal edema, posterior oropharyngeal erythema or tonsillar abscesses.  Mucosal pallor  Eyes: Conjunctivae and EOM are normal. Pupils are equal, round, and reactive to light. No scleral icterus.  Neck: Normal range of motion. Neck supple.  Cardiovascular: Normal rate, regular rhythm and intact distal pulses.   Murmur (flow murmur) heard. Pulmonary/Chest: Effort normal and breath sounds normal. No respiratory distress. He has no wheezes. He has no rales.  Lymphadenopathy:    He has no cervical adenopathy.  Skin: Skin is warm and dry. No rash noted.       Assessment & Plan:   Problem List Items Addressed This Visit   HIV INFECTION   Relevant Medications      azithromycin (ZITHROMAX) tablet   DIABETES MELLITUS, TYPE II   Acute kidney injury   Acute bacterial bronchitis - Primary     Overall improving but persistent sxs after 10d treatment. Complicated history. Given significant comorbidities (h/o HIV off meds, recent kidney failure slowly recovering), will cover with zpack - should be ok in kidney failure. Discussed with patient. Has appt with ID next week. Red flags to seek care sooner discussed.    Relevant Medications      azithromycin (ZITHROMAX) tablet       Follow up plan: Return if symptoms worsen or fail to improve.

## 2014-04-29 NOTE — Patient Instructions (Signed)
For persistent bronchitis, treat with zpack - should be ok with your kidneys. Keep appointment with ID for next week Good to see you today, call us with questions. Continue pushing fluids and get plenty of rest. Update [place] if not improving as expected.

## 2014-04-29 NOTE — Assessment & Plan Note (Addendum)
Overall improving but persistent sxs after 10d treatment. Complicated history. Given significant comorbidities (h/o HIV off meds, recent kidney failure slowly recovering), will cover with zpack - should be ok in kidney failure. Discussed with patient. Has appt with ID next week. Red flags to seek care sooner discussed.

## 2014-04-30 ENCOUNTER — Encounter: Payer: Self-pay | Admitting: Nurse Practitioner

## 2014-05-01 DIAGNOSIS — N179 Acute kidney failure, unspecified: Secondary | ICD-10-CM | POA: Diagnosis not present

## 2014-05-01 NOTE — Progress Notes (Signed)
I agree with the above note, plan 

## 2014-05-03 ENCOUNTER — Encounter: Payer: Self-pay | Admitting: Family Medicine

## 2014-05-03 ENCOUNTER — Ambulatory Visit (INDEPENDENT_AMBULATORY_CARE_PROVIDER_SITE_OTHER): Payer: Medicare Other | Admitting: Internal Medicine

## 2014-05-03 ENCOUNTER — Encounter: Payer: Self-pay | Admitting: Internal Medicine

## 2014-05-03 VITALS — BP 119/67 | HR 93 | Temp 98.3°F | Wt 115.0 lb

## 2014-05-03 DIAGNOSIS — N179 Acute kidney failure, unspecified: Secondary | ICD-10-CM | POA: Diagnosis not present

## 2014-05-03 DIAGNOSIS — Z23 Encounter for immunization: Secondary | ICD-10-CM | POA: Diagnosis not present

## 2014-05-03 DIAGNOSIS — I2510 Atherosclerotic heart disease of native coronary artery without angina pectoris: Secondary | ICD-10-CM

## 2014-05-03 DIAGNOSIS — B20 Human immunodeficiency virus [HIV] disease: Secondary | ICD-10-CM | POA: Diagnosis not present

## 2014-05-03 DIAGNOSIS — J9600 Acute respiratory failure, unspecified whether with hypoxia or hypercapnia: Secondary | ICD-10-CM | POA: Insufficient documentation

## 2014-05-03 MED ORDER — DARUNAVIR-COBICISTAT 800-150 MG PO TABS: 1.0000 | ORAL_TABLET | Freq: Every day | ORAL | Status: DC

## 2014-05-03 MED ORDER — ETRAVIRINE 200 MG PO TABS: 1.0000 | ORAL_TABLET | Freq: Two times a day (BID) | ORAL | Status: DC

## 2014-05-03 MED ORDER — RITONAVIR 100 MG PO TABS: 100.0000 mg | ORAL_TABLET | Freq: Every day | ORAL | Status: DC

## 2014-05-03 MED ORDER — DARUNAVIR ETHANOLATE 800 MG PO TABS: 800.0000 mg | ORAL_TABLET | Freq: Every day | ORAL | Status: DC

## 2014-05-03 MED ORDER — RALTEGRAVIR POTASSIUM 400 MG PO TABS: 400.0000 mg | ORAL_TABLET | Freq: Two times a day (BID) | ORAL | Status: DC

## 2014-05-03 NOTE — Assessment & Plan Note (Signed)
Improving.  Followed by Dr. Posey Pronto.

## 2014-05-03 NOTE — Assessment & Plan Note (Signed)
Resolved

## 2014-05-03 NOTE — Assessment & Plan Note (Signed)
Followed by ID.  Has upcoming appointment to restart medications now that pancreatitis has resolved.

## 2014-05-03 NOTE — Assessment & Plan Note (Addendum)
It is unclear what caused his lactic acidosis, renal failure and if any of the medications contributed.  Without any clear etiology however, it is possible to be related to ARVs and would consider NRTIs to be most possible.  I am going to start him on an NRTI-free regimen with Intellence, Isentress and Prezista/n and  I also discussed the possibility of streamlining his regimen once his renal function improves.   Case discussed with other providers, reviewed labs from nephrology.   RTC 3 weeks for labs and 4 weeks with primary provider, Dr. Megan Salon.

## 2014-05-03 NOTE — Progress Notes (Signed)
   Subjective:    Patient ID: [name], male    DOB: 1946/04/27, 69 y.o.   MRN: [id number]  HPI Patient presents for hospital follow-up of acute respiratory failure, renal failure and pancreatitis.  See hospital discharge summary for in-depth review. Since discharge patient endorses feeling well.  Denies shortness of breath, wheezing, chest pain, nausea, vomiting, diarrhea, fever, chills or malaise. Endorses good urinary output. Has not set up appointment with his Nephrologist at present, despite significantly impaired renal function and need for dialysis.  Review of Systems See HPI.  All other ROS are negative.    Objective:   Physical Exam  Vitals reviewed. Constitutional: He is oriented to person, place, and time. He appears well-developed and well-nourished.  HENT:  Head: Normocephalic and atraumatic.  Eyes: Conjunctivae are normal.  Neck: Neck supple.  Cardiovascular: Normal rate, regular rhythm, normal heart sounds and intact distal pulses.   Pulmonary/Chest: Effort normal and breath sounds normal.  Abdominal: Soft. Bowel sounds are normal. He exhibits no distension. There is no tenderness.  Neurological: He is alert and oriented to person, place, and time.  Skin: Skin is warm and dry. No rash noted.  Psychiatric: He has a normal mood and affect.   Assessment & Plan:  Pancreatitis Will recheck lipase and CMP. Avoid alcohol and carbohydrates.   Essential hypertension Will restart amlodpine. Will hold off on Cardura at present.  Stay well hydrated.  Follow-up in 1 week.  AKI (acute kidney injury) Severe.  Will repeat BMP. Dialysis likely to be needed.  Will facilitate appointment for patient.  Acute respiratory failure Resolved.  Human immunodeficiency virus (HIV) disease Followed by ID.  Has upcoming appointment to restart medications now that pancreatitis has resolved.

## 2014-05-03 NOTE — Assessment & Plan Note (Signed)
Will restart amlodpine. Will hold off on Cardura at present.  Stay well hydrated.  Follow-up in 1 week.

## 2014-05-03 NOTE — Assessment & Plan Note (Signed)
Severe.  Will repeat BMP. Dialysis likely to be needed.  Will facilitate appointment for patient.

## 2014-05-03 NOTE — Assessment & Plan Note (Signed)
Will recheck lipase and CMP. Avoid alcohol and carbohydrates.

## 2014-05-03 NOTE — Progress Notes (Signed)
   Subjective:    Patient ID: [name], male    DOB: 02-02-1946, 68 y.o.   MRN: [id number]  HPI Mr. Pellerito comes in here for hospital follow up.  He was initially hospitalized in April 2015 with pancreatitis and no cause identified.  He was on Combivir and Viramune at the time which were stopped and after resolution, started on Stribild.  He returned in 03/2014 with significant lactic acidosis, renal failure and required dialysis.  Underwent ERCP due to concern of pancreatitis with lipase > 3,000 and other evaluation but no cause identified.  Unclear if lactic acid more prominent, pancreatitis or other that caused underlying issues including renal failure.  He had dialysis cath placed and did get dialysis, though with creat improving had the catheter removed at discharge 9/7 and has not needed dialysis since.  Last creat around 4 with CrCl still well below 20.  Getting labs from Dr. Posey Pronto of renal weekly.  Awaiting last weeks results.  Good urine. Has not been started back on ARVs.     Review of Systems  Constitutional: Negative for fatigue.  HENT: Negative for trouble swallowing.   Respiratory: Negative for cough and shortness of breath.   Cardiovascular: Negative for leg swelling.  Gastrointestinal: Negative for nausea and diarrhea.  Skin: Negative for rash.  Neurological: Negative for dizziness and light-headedness.       Objective:   Physical Exam  Constitutional: He appears well-developed and well-nourished.  HENT:  Mouth/Throat: No oropharyngeal exudate.  Eyes: No scleral icterus.  Cardiovascular: Normal rate, regular rhythm and normal heart sounds.   No murmur heard. Pulmonary/Chest: Effort normal and breath sounds normal. No respiratory distress.  Lymphadenopathy:    He has no cervical adenopathy.  Skin: No rash noted.          Assessment & Plan:

## 2014-05-07 LAB — AFB CULTURE WITH SMEAR (NOT AT ARMC): Acid Fast Smear: NONE SEEN

## 2014-05-08 ENCOUNTER — Other Ambulatory Visit: Payer: Self-pay | Admitting: Family Medicine

## 2014-05-08 DIAGNOSIS — N179 Acute kidney failure, unspecified: Secondary | ICD-10-CM | POA: Diagnosis not present

## 2014-05-16 ENCOUNTER — Encounter: Payer: Self-pay | Admitting: Physician Assistant

## 2014-05-16 ENCOUNTER — Ambulatory Visit (INDEPENDENT_AMBULATORY_CARE_PROVIDER_SITE_OTHER): Payer: Medicare Other | Admitting: Physician Assistant

## 2014-05-16 VITALS — BP 122/61 | HR 82 | Temp 97.9°F | Resp 16 | Ht 63.0 in | Wt 122.5 lb

## 2014-05-16 DIAGNOSIS — I2510 Atherosclerotic heart disease of native coronary artery without angina pectoris: Secondary | ICD-10-CM

## 2014-05-16 DIAGNOSIS — R600 Localized edema: Secondary | ICD-10-CM

## 2014-05-16 DIAGNOSIS — N179 Acute kidney failure, unspecified: Secondary | ICD-10-CM

## 2014-05-16 LAB — BASIC METABOLIC PANEL
BUN: 23 mg/dL (ref 6–23)
CHLORIDE: 106 meq/L (ref 96–112)
CO2: 23 meq/L (ref 19–32)
CREATININE: 2.4 mg/dL — AB (ref 0.4–1.5)
Calcium: 9.2 mg/dL (ref 8.4–10.5)
GFR: 28.79 mL/min — ABNORMAL LOW (ref >60.00)
GLUCOSE: 184 mg/dL — AB (ref 70–99)
Potassium: 3.5 mEq/L (ref 3.5–5.1)
Sodium: 138 mEq/L (ref 135–145)

## 2014-05-16 NOTE — Progress Notes (Signed)
Pre visit review using our clinic review tool, if applicable. No additional management support is needed unless otherwise documented below in the visit note/SLS  

## 2014-05-16 NOTE — Patient Instructions (Signed)
Limit salt intake.  Wear compression stockings during the day.  Elevate legs while resting at home.  I will call you with your lab results.  We are making sure that your kidney function is not worsening.  We will treat based on results.

## 2014-05-18 ENCOUNTER — Other Ambulatory Visit (INDEPENDENT_AMBULATORY_CARE_PROVIDER_SITE_OTHER): Payer: Medicare Other

## 2014-05-18 ENCOUNTER — Other Ambulatory Visit: Payer: Self-pay | Admitting: Family Medicine

## 2014-05-18 DIAGNOSIS — N289 Disorder of kidney and ureter, unspecified: Secondary | ICD-10-CM

## 2014-05-18 DIAGNOSIS — Z79899 Other long term (current) drug therapy: Secondary | ICD-10-CM

## 2014-05-18 DIAGNOSIS — D649 Anemia, unspecified: Secondary | ICD-10-CM | POA: Diagnosis not present

## 2014-05-18 DIAGNOSIS — E785 Hyperlipidemia, unspecified: Secondary | ICD-10-CM

## 2014-05-18 NOTE — Telephone Encounter (Signed)
Dr. Charlett Blake   This patient came in today for labs , please could you  order the labs with the right diagnosis code. If labs are ordered by telephone note please future order labs.     Thank You         S.T

## 2014-05-19 ENCOUNTER — Other Ambulatory Visit: Payer: Self-pay | Admitting: Physician Assistant

## 2014-05-19 LAB — RENAL FUNCTION PANEL
Albumin: 3.4 g/dL — ABNORMAL LOW (ref 3.5–5.2)
BUN: 28 mg/dL — ABNORMAL HIGH (ref 6–23)
CHLORIDE: 107 meq/L (ref 96–112)
CO2: 25 mEq/L (ref 19–32)
Calcium: 9.3 mg/dL (ref 8.4–10.5)
Creatinine, Ser: 2.8 mg/dL — ABNORMAL HIGH (ref 0.4–1.5)
GFR: 24.42 mL/min — AB (ref >60.00)
GLUCOSE: 110 mg/dL — AB (ref 70–99)
PHOSPHORUS: 4 mg/dL (ref 2.3–4.6)
Potassium: 4 mEq/L (ref 3.5–5.1)
Sodium: 140 mEq/L (ref 135–145)

## 2014-05-19 LAB — LIPID PANEL
CHOLESTEROL: 127 mg/dL (ref 0–200)
HDL: 30.6 mg/dL — ABNORMAL LOW (ref >39.00)
LDL CALC: 72 mg/dL (ref 0–99)
NonHDL: 96.4
TRIGLYCERIDES: 122 mg/dL (ref 0.0–149.0)
Total CHOL/HDL Ratio: 4
VLDL: 24.4 mg/dL (ref 0.0–40.0)

## 2014-05-19 LAB — CBC
HCT: 23.1 % — CL (ref 39.0–52.0)
Hemoglobin: 7.8 g/dL — CL (ref 13.0–17.0)
MCHC: 33.9 g/dL (ref 30.0–36.0)
MCV: 99.8 fl (ref 78.0–100.0)
Platelets: 169 10*3/uL (ref 150.0–400.0)
RDW: 16.5 % — AB (ref 11.5–15.5)
WBC: 3.9 10*3/uL — AB (ref 4.0–10.5)

## 2014-05-19 LAB — HEPATIC FUNCTION PANEL
ALT: 17 U/L (ref 0–53)
AST: 18 U/L (ref 0–37)
Albumin: 3.4 g/dL — ABNORMAL LOW (ref 3.5–5.2)
Alkaline Phosphatase: 63 U/L (ref 39–117)
BILIRUBIN DIRECT: 0 mg/dL (ref 0.0–0.3)
TOTAL PROTEIN: 7.3 g/dL (ref 6.0–8.3)
Total Bilirubin: 0.5 mg/dL (ref 0.2–1.2)

## 2014-05-19 LAB — TSH: TSH: 1.36 u[IU]/mL (ref 0.35–4.50)

## 2014-05-21 DIAGNOSIS — R600 Localized edema: Secondary | ICD-10-CM | POA: Insufficient documentation

## 2014-05-21 HISTORY — DX: Localized edema: R60.0

## 2014-05-21 NOTE — Assessment & Plan Note (Signed)
Unclear etiology as patient with multiple chronic medical issues that could cause symptoms. Endorses improving renal function, but we will check repeat BMP to ensure no recurrence of AKI. Cardiovascular exam unremarkable except for pedal edema. No murmur auscultated at today's visit.  Patient endorses poor diet.  Symptoms possibly due to salt consumption.  Encouraged compression stocking use.  Rx given.  Limit salt intake. Patient given Rx for Lasix 20 mg by Neurologist.  Encouraged to use daily over the next few days.  Follow-up will be based on results. If not otherwise specified when results given, patient to follow-up in 2 weeks.

## 2014-05-21 NOTE — Progress Notes (Signed)
Patient presents to clinic today c/o swelling in bilateral feet and ankles x 5 days.  Patient denies trauma or injury.  Has history of acute renal failure, previously requiring HD during hospital stay.  Is followed every two weeks by Nephrology.  States his last creatinine was at 2.1 which is down from >7 a few weeks ago.  Is not watching salt consumption like he should.  Denies history of CHF, current chest pain, SOB, orthopnea or PND. Denies recent change to medications.  Is currently on amlodipine but has tolerated medication for several years without suffering side effect of pedal edema.  Past Medical History  Diagnosis Date  . Arthritis   . Diabetes mellitus   . History of depression   . GERD (gastroesophageal reflux disease)   . CAD (coronary artery disease)     2003- cabg  . Hypertension   . Hyperlipidemia   . History of kidney stones   . COPD (chronic obstructive pulmonary disease)   . HIV (human immunodeficiency virus infection) 1991    on meds since initial dx.   . Dermatitis 11/27/2012  . Nocturia 03/06/2013  . Epicondylitis 06/05/2013    right  . Pancreatitis 11/2013    attributed to HIV meds.     Current Outpatient Prescriptions on File Prior to Visit  Medication Sig Dispense Refill  . aspirin 81 MG chewable tablet Chew 81 mg by mouth daily.      Marland Kitchen atorvastatin (LIPITOR) 10 MG tablet Take 10 mg by mouth daily.      Marland Kitchen b complex vitamins tablet Take 1 tablet by mouth daily.      . Darunavir Ethanolate (PREZISTA) 800 MG tablet Take 1 tablet (800 mg total) by mouth daily.  30 tablet  5  . Etravirine 200 MG TABS Take 1 tablet (200 mg total) by mouth 2 (two) times daily after a meal.  60 tablet  5  . FREESTYLE TEST STRIPS test strip USE TO CHECK BLOOD SUGAR ONCE DAILY  100 each  0  . glipiZIDE (GLUCOTROL) 2.5 mg TABS tablet Take 0.5 tablets (2.5 mg total) by mouth daily before breakfast.  30 each  0  . HYDROcodone-acetaminophen (NORCO/VICODIN) 5-325 MG per tablet Take 1-2  tablets by mouth every 6 (six) hours as needed for moderate pain. Take one tablet when pain starts then may take another one if pain continues      . KRILL OIL OMEGA-3 PO Take 1 capsule by mouth daily.      . Lancets (FREESTYLE) lancets USE TO CHECK BLOOD SUGAR ONCE DAILY  100 each  0  . omeprazole (PRILOSEC) 20 MG capsule Take 20 mg by mouth daily.      . Probiotic Product (PROBIOTIC DAILY PO) Take 1 tablet by mouth daily.       . raltegravir (ISENTRESS) 400 MG tablet Take 1 tablet (400 mg total) by mouth 2 (two) times daily.  60 tablet  5  . ritonavir (NORVIR) 100 MG TABS tablet Take 1 tablet (100 mg total) by mouth daily.  30 tablet  5  . sodium bicarbonate 650 MG tablet Take 650 mg by mouth 3 (three) times daily.      . valACYclovir (VALTREX) 500 MG tablet TAKE 1 TABLET BY MOUTH TWICE DAILY  60 tablet  5   No current facility-administered medications on file prior to visit.    No Known Allergies  Family History  Problem Relation Age of Onset  . Arthritis      mother/father/paternal  grandparents  . Breast cancer Maternal Aunt     paternal aunt  . Lung cancer Maternal Aunt   . Hyperlipidemia Mother   . Hyperlipidemia Father   . Heart disease      parents/maternal grandparents/ 2 brothers  . Stroke Paternal Grandmother   . Hypertension Father     paternal grandmother/3 brothers/1 sister  . Mental retardation Sister   . Diabetes Mother     paternal grandparents/1 brother    History   Social History  . Marital Status: Divorced    Spouse Name: N/A    Number of Children: 28  . Years of Education: N/A   Occupational History  . 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    Social History Main Topics  . Smoking status: Never Smoker   . Smokeless tobacco: Never Used  . Alcohol Use: No  . Drug Use: No  . Sexual Activity: Not Currently     Comment: declined condoms   Other Topics Concern  . None   Social History Narrative   Lives alone.   Supportive friends and family.  His HIV Dx is not a secret.     Review of Systems - See HPI.  All other ROS are negative.  BP 122/61  Pulse 82  Temp(Src) 97.9 F (36.6 C) (Oral)  Resp 16  Ht 5\' 3"  (1.6 m)  Wt 122 lb 8 oz (55.566 kg)  BMI 21.71 kg/m2  SpO2 100%  Physical Exam  Vitals reviewed. Constitutional: He is oriented to person, place, and time and well-developed, well-nourished, and in no distress.  HENT:  Head: Normocephalic and atraumatic.  Eyes: Conjunctivae are normal.  Cardiovascular: Normal rate, regular rhythm, normal heart sounds and intact distal pulses.   Pulses:      Dorsalis pedis pulses are 2+ on the right side, and 2+ on the left side.       Posterior tibial pulses are 2+ on the right side, and 2+ on the left side.  1+ pitting pedal edema noted bilaterally.  Pulmonary/Chest: Effort normal and breath sounds normal. No respiratory distress. He has no wheezes. He has no rales. He exhibits no tenderness.  Neurological: He is alert and oriented to person, place, and time.  Skin: Skin is warm and dry. No rash noted.    Assessment/Plan: Pedal edema Unclear etiology as patient with multiple chronic medical issues that could cause symptoms. Endorses improving renal function, but we will check repeat BMP to ensure no recurrence of AKI. Cardiovascular exam unremarkable except for pedal edema. No murmur auscultated at today's visit.  Patient endorses poor diet.  Symptoms possibly due to salt consumption.  Encouraged compression stocking use.  Rx given.  Limit salt intake. Patient given Rx for Lasix 20 mg by Neurologist.  Encouraged to use daily over the next few days.  Follow-up will be based on results. If not otherwise specified when results given, patient to follow-up in 2 weeks.

## 2014-05-22 ENCOUNTER — Telehealth: Payer: Self-pay | Admitting: *Deleted

## 2014-05-22 DIAGNOSIS — N179 Acute kidney failure, unspecified: Secondary | ICD-10-CM | POA: Diagnosis not present

## 2014-05-22 NOTE — Telephone Encounter (Signed)
Received completed/signed form from Dr. Charlett Blake for patient's diabetic testing supplies. Form faxed to [facility] at [phone]. Form sent for scanning. JG//CMA

## 2014-05-24 ENCOUNTER — Other Ambulatory Visit: Payer: Medicare Other

## 2014-05-24 DIAGNOSIS — B20 Human immunodeficiency virus [HIV] disease: Secondary | ICD-10-CM

## 2014-05-24 LAB — COMPLETE METABOLIC PANEL WITH GFR
ALBUMIN: 4.3 g/dL (ref 3.5–5.2)
ALK PHOS: 70 U/L (ref 39–117)
ALT: 16 U/L (ref 0–53)
AST: 18 U/L (ref 0–37)
BUN: 24 mg/dL — ABNORMAL HIGH (ref 6–23)
CO2: 20 mEq/L (ref 19–32)
Calcium: 9.1 mg/dL (ref 8.4–10.5)
Chloride: 108 mEq/L (ref 96–112)
Creat: 2.37 mg/dL — ABNORMAL HIGH (ref 0.50–1.35)
GFR, Est African American: 32 mL/min — ABNORMAL LOW
GFR, Est Non African American: 28 mL/min — ABNORMAL LOW
GLUCOSE: 122 mg/dL — AB (ref 70–99)
POTASSIUM: 4.4 meq/L (ref 3.5–5.3)
SODIUM: 142 meq/L (ref 135–145)
TOTAL PROTEIN: 6.7 g/dL (ref 6.0–8.3)
Total Bilirubin: 0.4 mg/dL (ref 0.2–1.2)

## 2014-05-24 LAB — CBC WITH DIFFERENTIAL/PLATELET
BASOS ABS: 0 10*3/uL (ref 0.0–0.1)
BASOS PCT: 1 % (ref 0–1)
EOS PCT: 3 % (ref 0–5)
Eosinophils Absolute: 0.1 10*3/uL (ref 0.0–0.7)
HCT: 24.6 % — ABNORMAL LOW (ref 39.0–52.0)
Hemoglobin: 8.5 g/dL — ABNORMAL LOW (ref 13.0–17.0)
Lymphocytes Relative: 20 % (ref 12–46)
Lymphs Abs: 0.7 10*3/uL (ref 0.7–4.0)
MCH: 32.8 pg (ref 26.0–34.0)
MCHC: 34.6 g/dL (ref 30.0–36.0)
MCV: 95 fL (ref 78.0–100.0)
MONO ABS: 0.4 10*3/uL (ref 0.1–1.0)
Monocytes Relative: 10 % (ref 3–12)
Neutro Abs: 2.4 10*3/uL (ref 1.7–7.7)
Neutrophils Relative %: 66 % (ref 43–77)
Platelets: 206 10*3/uL (ref 150–400)
RBC: 2.59 MIL/uL — ABNORMAL LOW (ref 4.22–5.81)
RDW: 15.5 % (ref 11.5–15.5)
WBC: 3.7 10*3/uL — ABNORMAL LOW (ref 4.0–10.5)

## 2014-05-25 ENCOUNTER — Ambulatory Visit (INDEPENDENT_AMBULATORY_CARE_PROVIDER_SITE_OTHER): Payer: Medicare Other | Admitting: Family Medicine

## 2014-05-25 ENCOUNTER — Encounter: Payer: Self-pay | Admitting: Family Medicine

## 2014-05-25 VITALS — BP 122/61 | HR 82 | Temp 97.6°F | Ht 63.0 in | Wt 129.2 lb

## 2014-05-25 DIAGNOSIS — I2510 Atherosclerotic heart disease of native coronary artery without angina pectoris: Secondary | ICD-10-CM

## 2014-05-25 DIAGNOSIS — D649 Anemia, unspecified: Secondary | ICD-10-CM

## 2014-05-25 DIAGNOSIS — K5900 Constipation, unspecified: Secondary | ICD-10-CM

## 2014-05-25 DIAGNOSIS — I10 Essential (primary) hypertension: Secondary | ICD-10-CM | POA: Diagnosis not present

## 2014-05-25 DIAGNOSIS — B20 Human immunodeficiency virus [HIV] disease: Secondary | ICD-10-CM

## 2014-05-25 DIAGNOSIS — J9600 Acute respiratory failure, unspecified whether with hypoxia or hypercapnia: Secondary | ICD-10-CM

## 2014-05-25 DIAGNOSIS — R5383 Other fatigue: Secondary | ICD-10-CM

## 2014-05-25 LAB — HIV-1 RNA QUANT-NO REFLEX-BLD: HIV-1 RNA Quant, Log: <1.3 {Log} (ref <1.30)

## 2014-05-25 LAB — T-HELPER CELL (CD4) - (RCID CLINIC ONLY)
CD4 % Helper T Cell: 44 % (ref 33–55)
CD4 T Cell Abs: 300 /uL — ABNORMAL LOW (ref 400–2700)

## 2014-05-25 LAB — IRON AND TIBC
%SAT: 20 % (ref 20–55)
Iron: 49 ug/dL (ref 42–165)
TIBC: 248 ug/dL (ref 215–435)
UIBC: 199 ug/dL (ref 125–400)

## 2014-05-25 LAB — RETICULOCYTES
ABS Retic: 69.3 10*3/uL (ref 19.0–186.0)
RBC.: 2.39 MIL/uL — ABNORMAL LOW (ref 4.22–5.81)
Retic Ct Pct: 2.9 % — ABNORMAL HIGH (ref 0.4–2.3)

## 2014-05-25 MED ORDER — FUROSEMIDE 20 MG PO TABS: 40.0000 mg | ORAL_TABLET | Freq: Every day | ORAL | Status: DC | PRN

## 2014-05-25 NOTE — Patient Instructions (Signed)
Encouraged increased hydration and fiber in diet. Daily probiotics. If bowels not moving can use MOM 2 tbls po in 4 oz of warm prune juice by mouth every 2-3 days. If no results then repeat in 4 hours with  Dulcolax suppository pr, may repeat again in 4 more hours as needed. Seek care if symptoms worsen. Consider daily Miralax and/or Dulcolax if symptoms persist.  Increase benefiber to twice  Anemia, Nonspecific Anemia is a condition in which the concentration of red blood cells or hemoglobin in the blood is below normal. Hemoglobin is a substance in red blood cells that carries oxygen to the tissues of the body. Anemia results in not enough oxygen reaching these tissues.  CAUSES  Common causes of anemia include:   Excessive bleeding. Bleeding may be internal or external. This includes excessive bleeding from periods (in women) or from the intestine.   Poor nutrition.   Chronic kidney, thyroid, and liver disease.  Bone marrow disorders that decrease red blood cell production.  Cancer and treatments for cancer.  HIV, AIDS, and their treatments.  Spleen problems that increase red blood cell destruction.  Blood disorders.  Excess destruction of red blood cells due to infection, medicines, and autoimmune disorders. SIGNS AND SYMPTOMS   Minor weakness.   Dizziness.   Headache.  Palpitations.   Shortness of breath, especially with exercise.   Paleness.  Cold sensitivity.  Indigestion.  Nausea.  Difficulty sleeping.  Difficulty concentrating. Symptoms may occur suddenly or they may develop slowly.  DIAGNOSIS  Additional blood tests are often needed. These help your health care provider determine the best treatment. Your health care provider will check your stool for blood and look for other causes of blood loss.  TREATMENT  Treatment varies depending on the cause of the anemia. Treatment can include:   Supplements of iron, vitamin U88, or folic acid.   Hormone  medicines.   A blood transfusion. This may be needed if blood loss is severe.   Hospitalization. This may be needed if there is significant continual blood loss.   Dietary changes.  Spleen removal. HOME CARE INSTRUCTIONS Keep all follow-up appointments. It often takes many weeks to correct anemia, and having your health care provider check on your condition and your response to treatment is very important. SEEK IMMEDIATE MEDICAL CARE IF:   You develop extreme weakness, shortness of breath, or chest pain.   You become dizzy or have trouble concentrating.  You develop heavy vaginal bleeding.   You develop a rash.   You have bloody or black, tarry stools.   You faint.   You vomit up blood.   You vomit repeatedly.   You have abdominal pain.  You have a fever or persistent symptoms for more than 2-3 days.   You have a fever and your symptoms suddenly get worse.   You are dehydrated.  MAKE SURE YOU:  Understand these instructions.  Will watch your condition.  Will get help right away if you are not doing well or get worse. Document Released: 08/28/2004 Document Revised: 03/23/2013 Document Reviewed: 01/14/2013 [facility] Patient Information 2015 [place], [place]. This information is not intended to replace advice given to you by your health care provider. Make sure you discuss any questions you have with your health care provider.

## 2014-05-25 NOTE — Progress Notes (Signed)
Pre visit review using our clinic review tool, if applicable. No additional management support is needed unless otherwise documented below in the visit note. 

## 2014-05-26 LAB — HOMOCYSTEINE: HOMOCYSTEINE: 18.2 umol/L — AB (ref 4.0–15.4)

## 2014-05-26 LAB — FERRITIN: Ferritin: 281.3 ng/mL (ref 22.0–322.0)

## 2014-05-28 ENCOUNTER — Encounter: Payer: Self-pay | Admitting: Family Medicine

## 2014-05-28 DIAGNOSIS — K5900 Constipation, unspecified: Secondary | ICD-10-CM

## 2014-05-28 DIAGNOSIS — D649 Anemia, unspecified: Secondary | ICD-10-CM | POA: Insufficient documentation

## 2014-05-28 HISTORY — DX: Constipation, unspecified: K59.00

## 2014-05-28 HISTORY — DX: Anemia, unspecified: D64.9

## 2014-05-28 LAB — METHYLMALONIC ACID, SERUM: Methylmalonic Acid, Quant: 418 nmol/L — ABNORMAL HIGH (ref 87–318)

## 2014-05-28 NOTE — Assessment & Plan Note (Signed)
Encouraged increased hydration and fiber in diet. Daily probiotics. If bowels not moving can use MOM 2 tbls po in 4 oz of warm prune juice by mouth every 2-3 days. If no results then repeat in 4 hours with  Dulcolax suppository pr, may repeat again in 4 more hours as needed. Seek care if symptoms worsen. Consider daily Miralax and/or Dulcolax if symptoms persist.  

## 2014-05-28 NOTE — Assessment & Plan Note (Addendum)
Sugars improved minimize simple carbs but eat routine meals with protein and healthy fats

## 2014-05-28 NOTE — Assessment & Plan Note (Signed)
Recently hospitalized but doing better at this time, is following with infectious disease

## 2014-05-28 NOTE — Assessment & Plan Note (Signed)
Well controlled, no changes to meds. Encouraged heart healthy diet such as the DASH diet and exercise as tolerated.  °

## 2014-05-28 NOTE — Progress Notes (Signed)
Patient ID: [name], male   DOB: Dec 03, 1945, 68 y.o.   MRN: [id number] THALES KNIPPLE [id number] 1945-08-26 05/28/2014      Progress Note-Follow Up  Subjective  Chief Complaint  Chief Complaint  Patient presents with  . Follow-up    1 month    HPI  Patient is a 68 year old male in today for routine medical care. In today forl follow up. He was hospitalized with multiorgan failure over the summer and has continued to struggle, is noting persistent fatigue and malaise, persistent dyspnea and inability to get his strength back. Struggling with some mild constipation having to strain at times. Denies CP/palp/HA/congestion/fevers or GU c/o. Taking meds as prescribed  Past Medical History  Diagnosis Date  . Arthritis   . Diabetes mellitus   . History of depression   . GERD (gastroesophageal reflux disease)   . CAD (coronary artery disease)     2003- cabg  . Hypertension   . Hyperlipidemia   . History of kidney stones   . COPD (chronic obstructive pulmonary disease)   . HIV (human immunodeficiency virus infection) 1991    on meds since initial dx.   . Dermatitis 11/27/2012  . Nocturia 03/06/2013  . Epicondylitis 06/05/2013    right  . Pancreatitis 11/2013    attributed to HIV meds.     Past Surgical History  Procedure Laterality Date  . Coronary artery bypass graft  05/2002  . Vasectomy      Family History  Problem Relation Age of Onset  . Arthritis      mother/father/paternal grandparents  . Breast cancer Maternal Aunt     paternal aunt  . Lung cancer Maternal Aunt   . Hyperlipidemia Mother   . Hyperlipidemia Father   . Heart disease      parents/maternal grandparents/ 2 brothers  . Stroke Paternal Grandmother   . Hypertension Father     paternal grandmother/3 brothers/1 sister  . Mental retardation Sister   . Diabetes Mother     paternal grandparents/1 brother    History   Social History  . Marital Status: Divorced    Spouse Name: N/A    Number of  Children: 47  . Years of Education: N/A   Occupational History  . 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    Social History Main Topics  . Smoking status: Never Smoker   . Smokeless tobacco: Never Used  . Alcohol Use: No  . Drug Use: No  . Sexual Activity: Not Currently     Comment: declined condoms   Other Topics Concern  . Not on file   Social History Narrative   Lives alone.  Supportive friends and family.  His HIV Dx is not a secret.     Current Outpatient Prescriptions on File Prior to Visit  Medication Sig Dispense Refill  . amLODipine (NORVASC) 10 MG tablet TAKE 1 TABLET BY MOUTH AT BEDTIME  30 tablet  0  . aspirin 81 MG chewable tablet Chew 81 mg by mouth daily.      Marland Kitchen atorvastatin (LIPITOR) 10 MG tablet Take 10 mg by mouth daily.      Marland Kitchen b complex vitamins tablet Take 1 tablet by mouth daily.      . Darunavir Ethanolate (PREZISTA) 800 MG tablet Take 1 tablet (800 mg total) by mouth daily.  30 tablet  5  . Etravirine 200 MG TABS Take 1 tablet (200 mg total)  by mouth 2 (two) times daily after a meal.  60 tablet  5  . FREESTYLE TEST STRIPS test strip USE TO CHECK BLOOD SUGAR ONCE DAILY  100 each  0  . glipiZIDE (GLUCOTROL) 2.5 mg TABS tablet Take 0.5 tablets (2.5 mg total) by mouth daily before breakfast.  30 each  0  . HYDROcodone-acetaminophen (NORCO/VICODIN) 5-325 MG per tablet Take 1-2 tablets by mouth every 6 (six) hours as needed for moderate pain. Take one tablet when pain starts then may take another one if pain continues      . KRILL OIL OMEGA-3 PO Take 1 capsule by mouth daily.      . Lancets (FREESTYLE) lancets USE TO CHECK BLOOD SUGAR ONCE DAILY  100 each  0  . omeprazole (PRILOSEC) 20 MG capsule Take 20 mg by mouth daily.      . Probiotic Product (PROBIOTIC DAILY PO) Take 1 tablet by mouth daily.       . raltegravir (ISENTRESS) 400 MG tablet Take 1 tablet (400 mg total) by mouth 2 (two) times daily.  60 tablet  5  . ritonavir  (NORVIR) 100 MG TABS tablet Take 1 tablet (100 mg total) by mouth daily.  30 tablet  5  . sodium bicarbonate 650 MG tablet Take 650 mg by mouth 3 (three) times daily.      . valACYclovir (VALTREX) 500 MG tablet TAKE 1 TABLET BY MOUTH TWICE DAILY  60 tablet  5   No current facility-administered medications on file prior to visit.    No Known Allergies  Review of Systems  Review of Systems  Constitutional: Positive for malaise/fatigue. Negative for fever.  HENT: Negative for congestion.   Eyes: Negative for discharge.  Respiratory: Positive for shortness of breath.   Cardiovascular: Negative for chest pain, palpitations and leg swelling.  Gastrointestinal: Positive for heartburn and constipation. Negative for nausea, abdominal pain and diarrhea.  Genitourinary: Negative for dysuria.  Musculoskeletal: Positive for myalgias. Negative for falls.  Skin: Negative for rash.  Neurological: Negative for loss of consciousness and headaches.  Endo/Heme/Allergies: Negative for polydipsia.  Psychiatric/Behavioral: Negative for depression and suicidal ideas. The patient is not nervous/anxious and does not have insomnia.     Objective  BP 122/61  Pulse 82  Temp(Src) 97.6 F (36.4 C) (Oral)  Ht 5\' 3"  (1.6 m)  Wt 129 lb 3.2 oz (58.605 kg)  BMI 22.89 kg/m2  SpO2 100%  Physical Exam  Physical Exam  Constitutional: He is oriented to person, place, and time and well-developed, well-nourished, and in no distress. No distress.  frail  HENT:  Head: Normocephalic and atraumatic.  Eyes: Conjunctivae are normal.  Neck: Neck supple. No thyromegaly present.  Cardiovascular: Normal rate, regular rhythm and normal heart sounds.   No murmur heard. Pulmonary/Chest: Effort normal and breath sounds normal. No respiratory distress.  Abdominal: Soft. Bowel sounds are normal. He exhibits no distension and no mass. There is no tenderness.  Musculoskeletal: He exhibits no edema.  Neurological: He is alert  and oriented to person, place, and time.  Skin: Skin is warm.  Psychiatric: Memory, affect and judgment normal.    Lab Results  Component Value Date   TSH 1.36 05/18/2014   Lab Results  Component Value Date   WBC 3.7* 05/24/2014   HGB 8.5* 05/24/2014   HCT 24.6* 05/24/2014   MCV 95.0 05/24/2014   PLT 206 05/24/2014   Lab Results  Component Value Date   CREATININE 2.37* 05/24/2014   BUN 24*  05/24/2014   NA 142 05/24/2014   K 4.4 05/24/2014   CL 108 05/24/2014   CO2 20 05/24/2014   Lab Results  Component Value Date   ALT 16 05/24/2014   AST 18 05/24/2014   ALKPHOS 70 05/24/2014   BILITOT 0.4 05/24/2014   Lab Results  Component Value Date   CHOL 127 05/18/2014   Lab Results  Component Value Date   HDL 30.60* 05/18/2014   Lab Results  Component Value Date   LDLCALC 72 05/18/2014   Lab Results  Component Value Date   TRIG 122.0 05/18/2014   Lab Results  Component Value Date   CHOLHDL 4 05/18/2014     Assessment & Plan  Essential hypertension Well controlled, no changes to meds. Encouraged heart healthy diet such as the DASH diet and exercise as tolerated.   DIABETES MELLITUS, TYPE II Sugars improved minimize simple carbs but eat routine meals with protein and healthy fats  Human immunodeficiency virus (HIV) disease Recently hospitalized but doing better at this time, is following with infectious disease  Absolute anemia Homocysteine and methylmalanoic acid are elevated, previous IF negative, will have him start vitamin B12 SL and monitor  Acute respiratory failure No concerns noted on exam today. O2 100 % on RA  Constipation Encouraged increased hydration and fiber in diet. Daily probiotics. If bowels not moving can use MOM 2 tbls po in 4 oz of warm prune juice by mouth every 2-3 days. If no results then repeat in 4 hours with  Dulcolax suppository pr, may repeat again in 4 more hours as needed. Seek care if symptoms worsen. Consider daily Miralax  and/or Dulcolax if symptoms persist.

## 2014-05-28 NOTE — Assessment & Plan Note (Signed)
Homocysteine and methylmalanoic acid are elevated, previous IF negative, will have him start vitamin B12 SL and monitor

## 2014-05-28 NOTE — Assessment & Plan Note (Signed)
No concerns noted on exam today. O2 100 % on RA

## 2014-05-29 ENCOUNTER — Other Ambulatory Visit: Payer: Self-pay

## 2014-05-29 ENCOUNTER — Encounter: Payer: Self-pay | Admitting: Family Medicine

## 2014-05-29 MED ORDER — METOLAZONE 2.5 MG PO TABS
2.5000 mg | ORAL_TABLET | Freq: Every day | ORAL | Status: DC
Start: 2014-05-29 — End: 2014-07-25

## 2014-05-29 NOTE — Addendum Note (Signed)
Addended by: Varney Daily on: 05/29/2014 04:51 PM   Modules accepted: Orders

## 2014-05-30 ENCOUNTER — Other Ambulatory Visit: Payer: Self-pay

## 2014-05-30 MED ORDER — GLIPIZIDE 2.5 MG HALF TABLET: 2.5000 mg | ORAL_TABLET | Freq: Every day | ORAL | Status: DC

## 2014-05-31 ENCOUNTER — Other Ambulatory Visit: Payer: Medicare Other

## 2014-05-31 LAB — VITAMIN B1: Vitamin B1 (Thiamine): 380 nmol/L — ABNORMAL HIGH (ref 8–30)

## 2014-05-31 NOTE — Addendum Note (Signed)
Addended by: Modena Morrow D on: 05/31/2014 08:35 AM   Modules accepted: Orders

## 2014-06-01 ENCOUNTER — Ambulatory Visit: Payer: Self-pay | Admitting: Family Medicine

## 2014-06-05 ENCOUNTER — Encounter: Payer: Self-pay | Admitting: Internal Medicine

## 2014-06-05 ENCOUNTER — Ambulatory Visit (INDEPENDENT_AMBULATORY_CARE_PROVIDER_SITE_OTHER): Payer: Medicare Other | Admitting: Internal Medicine

## 2014-06-05 VITALS — BP 129/67 | HR 91 | Temp 98.4°F | Wt 124.8 lb

## 2014-06-05 DIAGNOSIS — B20 Human immunodeficiency virus [HIV] disease: Secondary | ICD-10-CM | POA: Diagnosis not present

## 2014-06-05 DIAGNOSIS — I2510 Atherosclerotic heart disease of native coronary artery without angina pectoris: Secondary | ICD-10-CM

## 2014-06-05 DIAGNOSIS — Z79899 Other long term (current) drug therapy: Secondary | ICD-10-CM | POA: Diagnosis not present

## 2014-06-05 NOTE — Progress Notes (Signed)
Patient ID: LUM STILLINGER, male   DOB: Jan 09, 1946, 68 y.o.   MRN: [id number]          Patient Active Problem List   Diagnosis Date Noted  . DIABETES MELLITUS, TYPE II 09/03/2006    Priority: High  . Human immunodeficiency virus (HIV) disease 05/12/2006    Priority: High  . DYSLIPIDEMIA 05/12/2006    Priority: High  . CORONARY ARTERY DISEASE 05/12/2006    Priority: High  . Absolute anemia 05/28/2014  . Constipation 05/28/2014  . Pedal edema 05/21/2014  . Acute respiratory failure 05/03/2014  . Posterior cervical lymphadenopathy 03/30/2014  . AKI (acute kidney injury) 03/23/2014  . Metabolic acidosis 53/64/6803  . Hyperkalemia 03/23/2014  . Hyponatremia 03/23/2014  . Protein-calorie malnutrition, severe 03/23/2014  . Diarrhea 11/20/2013  . HLD (hyperlipidemia) 11/19/2013  . Ascites 11/18/2013  . Pancreatitis 11/15/2013  . Epicondylitis 06/05/2013  . Nocturia 03/06/2013  . Inguinal adenopathy 01/11/2013  . Dermatitis 11/27/2012  . Night sweats 07/06/2012  . Erectile dysfunction 01/01/2012  . Lipodystrophy 12/20/2010  . Dry eye syndrome 12/20/2010  . BELLS PALSY 07/19/2010  . GENITAL HERPES 05/03/2009  . SHINGLES, HX OF 05/03/2009  . WEIGHT LOSS, ABNORMAL 04/03/2009  . INGUINAL LYMPHADENOPATHY, RIGHT 04/03/2009  . MEMORY LOSS 09/18/2008  . PERIPHERAL VASCULAR DISEASE 07/20/2008  . COPD 07/20/2008  . HIP PAIN, BILATERAL 07/17/2008  . KNEE PAIN, BILATERAL 07/17/2008  . HEMATOCHEZIA 04/18/2008  . NEPHROLITHIASIS, HX OF 02/22/2008  . DEPRESSION 09/03/2006  . GERD 09/03/2006  . CORONARY ARTERY BYPASS GRAFT, HX OF 09/03/2006  . HEARING LOSS, SENSORINEURAL 05/12/2006  . Essential hypertension 05/12/2006    Patient's Medications  New Prescriptions   No medications on file  Previous Medications   AMLODIPINE (NORVASC) 10 MG TABLET    TAKE 1 TABLET BY MOUTH AT BEDTIME   ASPIRIN 81 MG CHEWABLE TABLET    Chew 81 mg by mouth daily.   ATORVASTATIN (LIPITOR) 10 MG TABLET     Take 10 mg by mouth daily.   B COMPLEX VITAMINS TABLET    Take 1 tablet by mouth daily.   DARUNAVIR ETHANOLATE (PREZISTA) 800 MG TABLET    Take 1 tablet (800 mg total) by mouth daily.   ETRAVIRINE 200 MG TABS    Take 1 tablet (200 mg total) by mouth 2 (two) times daily after a meal.   FREESTYLE TEST STRIPS TEST STRIP    USE TO CHECK BLOOD SUGAR ONCE DAILY   FUROSEMIDE (LASIX) 20 MG TABLET    Take 20 mg by mouth daily as needed for fluid or edema.   GLIPIZIDE (GLUCOTROL) 2.5 MG TABS TABLET    Take 0.5 tablets (2.5 mg total) by mouth daily before breakfast.   HYDROCODONE-ACETAMINOPHEN (NORCO/VICODIN) 5-325 MG PER TABLET    Take 1-2 tablets by mouth every 6 (six) hours as needed for moderate pain. Take one tablet when pain starts then may take another one if pain continues   KRILL OIL OMEGA-3 PO    Take 1 capsule by mouth daily.   LANCETS (FREESTYLE) LANCETS    USE TO CHECK BLOOD SUGAR ONCE DAILY   METOLAZONE (ZAROXOLYN) 2.5 MG TABLET    Take 1 tablet (2.5 mg total) by mouth daily. Take 20 minutes prior to lasix   OMEPRAZOLE (PRILOSEC) 20 MG CAPSULE    Take 20 mg by mouth daily.   PROBIOTIC PRODUCT (PROBIOTIC DAILY PO)    Take 1 tablet by mouth daily.    RALTEGRAVIR (ISENTRESS) 400 MG TABLET  Take 1 tablet (400 mg total) by mouth 2 (two) times daily.   RITONAVIR (NORVIR) 100 MG TABS TABLET    Take 1 tablet (100 mg total) by mouth daily.   SODIUM BICARBONATE 650 MG TABLET    Take 650 mg by mouth 3 (three) times daily.   VALACYCLOVIR (VALTREX) 500 MG TABLET    TAKE 1 TABLET BY MOUTH TWICE DAILY  Modified Medications   No medications on file  Discontinued Medications   No medications on file    Subjective: Milburn is in for his routine follow-up visit. He has been hospitalized twice since April of this year with pancreatitis. Because of concerns that his antiretroviral regimen of Combivir and Viramune caused the pancreatitis he was switched to [place] after his first hospitalization. He developed  a relapse with lactic acidosis and renal insufficiency and his Stribild was stopped. He required hemodialysis transiently. After that hospitalization he was seen by my partner, Dr. Linus Salmons, and started on Intelence, Isentress, Prezista and Norvir. He cannot name all of these medications but he can pick them off of the chart and describes taking them correctly. He denies missing any doses. He has not had any problems tolerating them. He has had no abdominal pain, nausea, vomiting and his appetite has improved. His last creatinine was down to 2.37. Review of Systems: Pertinent items are noted in HPI.  Past Medical History  Diagnosis Date  . Arthritis   . Diabetes mellitus   . History of depression   . GERD (gastroesophageal reflux disease)   . CAD (coronary artery disease)     2003- cabg  . Hypertension   . Hyperlipidemia   . History of kidney stones   . COPD (chronic obstructive pulmonary disease)   . HIV (human immunodeficiency virus infection) 1991    on meds since initial dx.   . Dermatitis 11/27/2012  . Nocturia 03/06/2013  . Epicondylitis 06/05/2013    right  . Pancreatitis 11/2013    attributed to HIV meds.   . Constipation 05/28/2014    History  Substance Use Topics  . Smoking status: Never Smoker   . Smokeless tobacco: Never Used  . Alcohol Use: No    Family History  Problem Relation Age of Onset  . Arthritis      mother/father/paternal grandparents  . Breast cancer Maternal Aunt     paternal aunt  . Lung cancer Maternal Aunt   . Hyperlipidemia Mother   . Hyperlipidemia Father   . Heart disease      parents/maternal grandparents/ 2 brothers  . Stroke Paternal Grandmother   . Hypertension Father     paternal grandmother/3 brothers/1 sister  . Mental retardation Sister   . Diabetes Mother     paternal grandparents/1 brother    No Known Allergies  Objective: Temp: 98.4 F (36.9 C) (11/02 1514) Temp Source: Oral (11/02 1514) BP: 129/67 mmHg (11/02 1514) Pulse  Rate: 91 (11/02 1514) Body mass index is 22.1 kg/(m^2).  General: he is smiling and in good spirits Oral: no oropharyngeal lesions Skin: no rash Lungs: clear Cor: regular S1 and S2 with no murmur Abdomen: soft and nontender  Lab Results Lab Results  Component Value Date   WBC 3.7* 05/24/2014   HGB 8.5* 05/24/2014   HCT 24.6* 05/24/2014   MCV 95.0 05/24/2014   PLT 206 05/24/2014    Lab Results  Component Value Date   CREATININE 2.37* 05/24/2014   BUN 24* 05/24/2014   NA 142 05/24/2014   K  4.4 05/24/2014   CL 108 05/24/2014   CO2 20 05/24/2014    Lab Results  Component Value Date   ALT 16 05/24/2014   AST 18 05/24/2014   ALKPHOS 70 05/24/2014   BILITOT 0.4 05/24/2014    Lab Results  Component Value Date   CHOL 127 05/18/2014   HDL 30.60* 05/18/2014   LDLCALC 72 05/18/2014   LDLDIRECT 68 05/04/2007   TRIG 122.0 05/18/2014   CHOLHDL 4 05/18/2014    Lab Results HIV 1 RNA QUANT (copies/mL)  Date Value  05/24/2014 <20  01/26/2014 <20  09/29/2012 <20   HIV-1 RNA VIRAL LOAD (no units)  Date Value  07/14/2013 <40  04/13/2013 <40  01/18/2013 <40   CD4 (no units)  Date Value  07/14/2013 514  04/13/2013 498  01/18/2013 522   CD4 T CELL ABS  Date Value  05/24/2014 300 /uL*  01/26/2014 320 /uL*  06/22/2012 430 cmm     Assessment: Michae is improving after his latest bout of pancreatitis and renal insufficiency. His current antiretroviral regimen is relatively complex but he is tolerating it well and not missing doses and his viral load is undetectable. If his renal function continues to improve there will be some options for simplifying his regimen.  Plan: 1. Continue current antiretroviral regimen 2. Follow-up after lab work in 3 months   Michel Bickers, MD [facility] for [place] [phone] pager   [phone] cell 06/05/2014, 3:33 PM

## 2014-06-05 NOTE — Progress Notes (Signed)
HPI: FU coronary artery disease. Patient is status post coronary artery bypass and graft in 2003. Myoview in February 2013 showed an ejection fraction of 67% and normal perfusion. Echocardiogram in August 2015 showed normal LV function. Patient was admitted August 2015 with weakness and found to be in respiratory failure, acute renal failure and also had pancreatitis. Patient required transient dialysis. Since he was discharged he denies dyspnea, chest pain or syncope. Mild pedal edema which is improving. He does have generalized weakness which is also improving.  Current Outpatient Prescriptions  Medication Sig Dispense Refill  . amLODipine (NORVASC) 10 MG tablet TAKE 1 TABLET BY MOUTH AT BEDTIME 30 tablet 0  . aspirin 81 MG chewable tablet Chew 81 mg by mouth daily.    Marland Kitchen atorvastatin (LIPITOR) 10 MG tablet Take 10 mg by mouth daily.    Marland Kitchen b complex vitamins tablet Take 1 tablet by mouth daily.    . Darunavir Ethanolate (PREZISTA) 800 MG tablet Take 1 tablet (800 mg total) by mouth daily. 30 tablet 5  . Etravirine 200 MG TABS Take 1 tablet (200 mg total) by mouth 2 (two) times daily after a meal. 60 tablet 5  . FREESTYLE TEST STRIPS test strip USE TO CHECK BLOOD SUGAR ONCE DAILY 100 each 0  . furosemide (LASIX) 20 MG tablet Take 20 mg by mouth daily as needed for fluid or edema.    Marland Kitchen glipiZIDE (GLUCOTROL) 2.5 mg TABS tablet Take 0.5 tablets (2.5 mg total) by mouth daily before breakfast. 30 each 4  . HYDROcodone-acetaminophen (NORCO/VICODIN) 5-325 MG per tablet Take 1-2 tablets by mouth every 6 (six) hours as needed for moderate pain. Take one tablet when pain starts then may take another one if pain continues    . KRILL OIL OMEGA-3 PO Take 1 capsule by mouth daily.    . Lancets (FREESTYLE) lancets USE TO CHECK BLOOD SUGAR ONCE DAILY 100 each 0  . metolazone (ZAROXOLYN) 2.5 MG tablet Take 1 tablet (2.5 mg total) by mouth daily. Take 20 minutes prior to lasix 30 tablet 1  . omeprazole  (PRILOSEC) 20 MG capsule Take 20 mg by mouth daily.    . Probiotic Product (PROBIOTIC DAILY PO) Take 1 tablet by mouth daily.     . raltegravir (ISENTRESS) 400 MG tablet Take 1 tablet (400 mg total) by mouth 2 (two) times daily. 60 tablet 5  . ritonavir (NORVIR) 100 MG TABS tablet Take 1 tablet (100 mg total) by mouth daily. 30 tablet 5  . sodium bicarbonate 650 MG tablet Take 650 mg by mouth 3 (three) times daily.    . valACYclovir (VALTREX) 500 MG tablet TAKE 1 TABLET BY MOUTH TWICE DAILY 60 tablet 5   No current facility-administered medications for this visit.     Past Medical History  Diagnosis Date  . Arthritis   . Diabetes mellitus   . History of depression   . GERD (gastroesophageal reflux disease)   . CAD (coronary artery disease)     2003- cabg  . Hypertension   . Hyperlipidemia   . History of kidney stones   . COPD (chronic obstructive pulmonary disease)   . HIV (human immunodeficiency virus infection) 1991    on meds since initial dx.   . Dermatitis 11/27/2012  . Nocturia 03/06/2013  . Epicondylitis 06/05/2013    right  . Pancreatitis 11/2013    attributed to HIV meds.   . Constipation 05/28/2014    Past Surgical History  Procedure  Laterality Date  . Coronary artery bypass graft  05/2002  . Vasectomy      History   Social History  . Marital Status: Divorced    Spouse Name: N/A    Number of Children: 57  . Years of Education: N/A   Occupational History  . 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    Social History Main Topics  . Smoking status: Never Smoker   . Smokeless tobacco: Never Used  . Alcohol Use: No  . Drug Use: No  . Sexual Activity: Not Currently     Comment: declined condoms   Other Topics Concern  . Not on file   Social History Narrative   Lives alone.  Supportive friends and family.  His HIV Dx is not a secret.     ROS: Weakness and recent URI but hemoptysis, dysphasia, odynophagia, melena,  hematochezia, dysuria, hematuria, rash, seizure activity, orthopnea, PND, claudication. Remaining systems are negative.  Physical Exam: Well-developed well-nourished in no acute distress.  Skin is warm and dry.  HEENT is normal.  Neck is supple.  Chest is clear to auscultation with normal expansion.  Cardiovascular exam is regular rate and rhythm.  Abdominal exam nontender or distended. No masses palpated. Extremities show trace edema. neuro grossly intact  ECG 03/22/2014-sinus rhythm with first-degree AV block.

## 2014-06-06 ENCOUNTER — Telehealth: Payer: Self-pay

## 2014-06-06 NOTE — Telephone Encounter (Signed)
[name] [phone]  Hazel called and he is getting a head cold and did not want to take anything over the counter without first checking with Dr Charlett Blake, since he has had a lot going on with him.

## 2014-06-07 ENCOUNTER — Other Ambulatory Visit: Payer: Medicare Other

## 2014-06-07 ENCOUNTER — Telehealth: Payer: Self-pay | Admitting: Gynecology

## 2014-06-07 DIAGNOSIS — D649 Anemia, unspecified: Secondary | ICD-10-CM | POA: Diagnosis not present

## 2014-06-07 DIAGNOSIS — J9600 Acute respiratory failure, unspecified whether with hypoxia or hypercapnia: Secondary | ICD-10-CM | POA: Diagnosis not present

## 2014-06-07 DIAGNOSIS — B20 Human immunodeficiency virus [HIV] disease: Secondary | ICD-10-CM

## 2014-06-07 NOTE — Telephone Encounter (Signed)
Afebrile.  Dr. Frederik Pear recommendations were discussed with patient.  Pt stated understanding and agreed.

## 2014-06-07 NOTE — Telephone Encounter (Signed)
Please advise.//AB/CMA 

## 2014-06-07 NOTE — Telephone Encounter (Signed)
Pt called and says he has appt tomorrow with Dr Stanford Breed. He is sick ,should he still come for his appointment?

## 2014-06-07 NOTE — Telephone Encounter (Signed)
He can take [place] HBP for the cough and Mucinex 400-600 mg twice daily. Drink plenty of fluids, take some zinc such Coldeeze every day and if he is having fevers he should come in for evaluation, we will need to consider an antibiotic.

## 2014-06-07 NOTE — Telephone Encounter (Signed)
Spoke to patient  he states he is sneezing and coughing a little  No fever at present RN informed patient if no fever he may come to appointment or if no Cardiac symptoms he could reschedule. Patient states he rather come to appointment tomorrow. rn states that will be find he can wear a face mask. As long as he does not have a fever.

## 2014-06-08 ENCOUNTER — Encounter: Payer: Self-pay | Admitting: Cardiology

## 2014-06-08 ENCOUNTER — Ambulatory Visit (INDEPENDENT_AMBULATORY_CARE_PROVIDER_SITE_OTHER): Payer: Medicare Other | Admitting: Cardiology

## 2014-06-08 VITALS — BP 115/62 | HR 93 | Ht 63.0 in | Wt 122.6 lb

## 2014-06-08 DIAGNOSIS — I2510 Atherosclerotic heart disease of native coronary artery without angina pectoris: Secondary | ICD-10-CM

## 2014-06-08 DIAGNOSIS — I10 Essential (primary) hypertension: Secondary | ICD-10-CM

## 2014-06-08 LAB — INTRINSIC FACTOR ANTIBODIES: Intrinsic Factor: NEGATIVE

## 2014-06-08 NOTE — Assessment & Plan Note (Signed)
Blood pressure controlled. Continue present medications. 

## 2014-06-08 NOTE — Assessment & Plan Note (Signed)
Continue statin. 

## 2014-06-08 NOTE — Assessment & Plan Note (Signed)
Continue aspirin and statin. 

## 2014-06-08 NOTE — Patient Instructions (Signed)
Your physician wants you to follow-up in: ONE YEAR WITH DR CRENSHAW You will receive a reminder letter in the mail two months in advance. If you don't receive a letter, please call our office to schedule the follow-up appointment.  

## 2014-06-20 DIAGNOSIS — N179 Acute kidney failure, unspecified: Secondary | ICD-10-CM | POA: Diagnosis not present

## 2014-06-21 ENCOUNTER — Encounter: Payer: Self-pay | Admitting: Family Medicine

## 2014-06-21 ENCOUNTER — Other Ambulatory Visit: Payer: Self-pay | Admitting: Family Medicine

## 2014-06-23 ENCOUNTER — Encounter: Payer: Self-pay | Admitting: Family Medicine

## 2014-06-23 ENCOUNTER — Ambulatory Visit (INDEPENDENT_AMBULATORY_CARE_PROVIDER_SITE_OTHER): Payer: Medicare Other | Admitting: Family Medicine

## 2014-06-23 VITALS — BP 118/57 | HR 78 | Temp 97.5°F | Ht 63.0 in | Wt 125.2 lb

## 2014-06-23 DIAGNOSIS — E118 Type 2 diabetes mellitus with unspecified complications: Secondary | ICD-10-CM

## 2014-06-23 DIAGNOSIS — I10 Essential (primary) hypertension: Secondary | ICD-10-CM

## 2014-06-23 DIAGNOSIS — R350 Frequency of micturition: Secondary | ICD-10-CM | POA: Diagnosis not present

## 2014-06-23 DIAGNOSIS — E1139 Type 2 diabetes mellitus with other diabetic ophthalmic complication: Secondary | ICD-10-CM | POA: Diagnosis not present

## 2014-06-23 DIAGNOSIS — E876 Hypokalemia: Secondary | ICD-10-CM

## 2014-06-23 DIAGNOSIS — R351 Nocturia: Secondary | ICD-10-CM

## 2014-06-23 DIAGNOSIS — E785 Hyperlipidemia, unspecified: Secondary | ICD-10-CM | POA: Diagnosis not present

## 2014-06-23 LAB — RENAL FUNCTION PANEL
Albumin: 4 g/dL (ref 3.5–5.2)
BUN: 58 mg/dL — ABNORMAL HIGH (ref 6–23)
CALCIUM: 9.3 mg/dL (ref 8.4–10.5)
CHLORIDE: 96 meq/L (ref 96–112)
CO2: 25 meq/L (ref 19–32)
Creatinine, Ser: 2.3 mg/dL — ABNORMAL HIGH (ref 0.4–1.5)
GFR: 29.78 mL/min — AB (ref >60.00)
Glucose, Bld: 368 mg/dL — ABNORMAL HIGH (ref 70–99)
Phosphorus: 4 mg/dL (ref 2.3–4.6)
Potassium: 3.2 mEq/L — ABNORMAL LOW (ref 3.5–5.1)
Sodium: 134 mEq/L — ABNORMAL LOW (ref 135–145)

## 2014-06-23 LAB — CBC
HCT: 27.7 % — ABNORMAL LOW (ref 39.0–52.0)
HEMOGLOBIN: 9.3 g/dL — AB (ref 13.0–17.0)
MCHC: 33.6 g/dL (ref 30.0–36.0)
MCV: 99.5 fl (ref 78.0–100.0)
PLATELETS: 217 10*3/uL (ref 150.0–400.0)
RBC: 2.79 Mil/uL — ABNORMAL LOW (ref 4.22–5.81)
RDW: 13.5 % (ref 11.5–15.5)
WBC: 5.8 10*3/uL (ref 4.0–10.5)

## 2014-06-23 LAB — LIPID PANEL
CHOL/HDL RATIO: 4
Cholesterol: 160 mg/dL (ref 0–200)
HDL: 38 mg/dL — ABNORMAL LOW (ref >39.00)
LDL CALC: 84 mg/dL (ref 0–99)
NONHDL: 122
Triglycerides: 191 mg/dL — ABNORMAL HIGH (ref 0.0–149.0)
VLDL: 38.2 mg/dL (ref 0.0–40.0)

## 2014-06-23 LAB — URINALYSIS
Bilirubin Urine: NEGATIVE
KETONES UR: NEGATIVE
LEUKOCYTES UA: NEGATIVE
Nitrite: NEGATIVE
PH: 5.5 (ref 5.0–8.0)
SPECIFIC GRAVITY, URINE: 1.01 (ref 1.000–1.030)
Total Protein, Urine: NEGATIVE
URINE GLUCOSE: 500 — AB
Urobilinogen, UA: 0.2 (ref 0.0–1.0)

## 2014-06-23 LAB — HEPATIC FUNCTION PANEL
ALT: 13 U/L (ref 0–53)
AST: 14 U/L (ref 0–37)
Albumin: 4 g/dL (ref 3.5–5.2)
Alkaline Phosphatase: 102 U/L (ref 39–117)
BILIRUBIN DIRECT: 0 mg/dL (ref 0.0–0.3)
Total Bilirubin: 0.6 mg/dL (ref 0.2–1.2)
Total Protein: 7.4 g/dL (ref 6.0–8.3)

## 2014-06-23 LAB — HEMOGLOBIN A1C: Hgb A1c MFr Bld: 6.5 % (ref 4.6–6.5)

## 2014-06-23 LAB — TSH: TSH: 1.94 u[IU]/mL (ref 0.35–4.50)

## 2014-06-23 NOTE — Patient Instructions (Signed)
Return to [facility] due to urinary frequency    Benign Prostatic Hyperplasia An enlarged prostate (benign prostatic hyperplasia) is common in older men. You may experience the following:  Weak urine stream.  Dribbling.  Feeling like the bladder has not emptied completely.  Difficulty starting urination.  Getting up frequently at night to urinate.  Urinating more frequently during the day. HOME CARE INSTRUCTIONS  Monitor your prostatic hyperplasia for any changes. The following actions may help to alleviate any discomfort you are experiencing:  Give yourself time when you urinate.  Stay away from alcohol.  Avoid beverages containing caffeine, such as coffee, tea, and colas, because they can make the problem worse.  Avoid decongestants, antihistamines, and some prescription medicines that can make the problem worse.  Follow up with your health care provider for further treatment as recommended. SEEK MEDICAL CARE IF:  You are experiencing progressive difficulty voiding.  Your urine stream is progressively getting narrower.  You are awaking from sleep with the urge to void more frequently.  You are constantly feeling the need to void.  You experience loss of urine, especially in small amounts. SEEK IMMEDIATE MEDICAL CARE IF:   You develop increased pain with urination or are unable to urinate.  You develop severe abdominal pain, vomiting, a high fever, or fainting.  You develop back pain or blood in your urine. MAKE SURE YOU:   Understand these instructions.  Will watch your condition.  Will get help right away if you are not doing well or get worse. Document Released: 07/21/2005 Document Revised: 03/23/2013 Document Reviewed: 12/21/2012 [facility] Patient Information 2015 [place], [place]. This information is not intended to replace advice given to you by your health care provider. Make sure you discuss any questions you have with your health care  provider.

## 2014-06-23 NOTE — Progress Notes (Signed)
Pre visit review using our clinic review tool, if applicable. No additional management support is needed unless otherwise documented below in the visit note. 

## 2014-06-26 DIAGNOSIS — N183 Chronic kidney disease, stage 3 (moderate): Secondary | ICD-10-CM | POA: Diagnosis not present

## 2014-06-26 DIAGNOSIS — N2581 Secondary hyperparathyroidism of renal origin: Secondary | ICD-10-CM | POA: Diagnosis not present

## 2014-06-26 DIAGNOSIS — D631 Anemia in chronic kidney disease: Secondary | ICD-10-CM | POA: Diagnosis not present

## 2014-06-26 DIAGNOSIS — I129 Hypertensive chronic kidney disease with stage 1 through stage 4 chronic kidney disease, or unspecified chronic kidney disease: Secondary | ICD-10-CM | POA: Diagnosis not present

## 2014-07-02 ENCOUNTER — Encounter: Payer: Self-pay | Admitting: Family Medicine

## 2014-07-02 NOTE — Assessment & Plan Note (Signed)
Tolerating statin, encouraged heart healthy diet, avoid trans fats, minimize simple carbs and saturated fats. Increase exercise as tolerated 

## 2014-07-02 NOTE — Progress Notes (Signed)
[name]  [id number] 1946/06/27 07/02/2014      Progress Note-Follow Up  Subjective  Chief Complaint  Chief Complaint  Patient presents with  . Follow-up    4 week    HPI  Patient is a 68 y.o. male in today for routine medical care. He is noting persistent nocturia getting up every 1-2 hours at night. Able to go back to sleep most of the time. No recent fevers or chills. No other acute complaints at today's visit. Denies CP/palp/SOB/HA/congestion/fevers/GI or GU c/o. Taking meds as prescribed  Past Medical History  Diagnosis Date  . Arthritis   . Diabetes mellitus   . History of depression   . GERD (gastroesophageal reflux disease)   . CAD (coronary artery disease)     2003- cabg  . Hypertension   . Hyperlipidemia   . History of kidney stones   . COPD (chronic obstructive pulmonary disease)   . HIV (human immunodeficiency virus infection) 1991    on meds since initial dx.   . Dermatitis 11/27/2012  . Nocturia 03/06/2013  . Epicondylitis 06/05/2013    right  . Pancreatitis 11/2013    attributed to HIV meds.   . Constipation 05/28/2014    Past Surgical History  Procedure Laterality Date  . Coronary artery bypass graft  05/2002  . Vasectomy      Family History  Problem Relation Age of Onset  . Arthritis      mother/father/paternal grandparents  . Breast cancer Maternal Aunt     paternal aunt  . Lung cancer Maternal Aunt   . Hyperlipidemia Mother   . Hyperlipidemia Father   . Heart disease      parents/maternal grandparents/ 2 brothers  . Stroke Paternal Grandmother   . Hypertension Father     paternal grandmother/3 brothers/1 sister  . Mental retardation Sister   . Diabetes Mother     paternal grandparents/1 brother    History   Social History  . Marital Status: Divorced    Spouse Name: N/A    Number of Children: 48  . Years of Education: N/A   Occupational History  . 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    Social History Main Topics  . Smoking status: Never Smoker   . Smokeless tobacco: Never Used  . Alcohol Use: No  . Drug Use: No  . Sexual Activity: Not Currently     Comment: declined condoms   Other Topics Concern  . Not on file   Social History Narrative   Lives alone.  Supportive friends and family.  His HIV Dx is not a secret.     Current Outpatient Prescriptions on File Prior to Visit  Medication Sig Dispense Refill  . amLODipine (NORVASC) 10 MG tablet TAKE 1 TABLET BY MOUTH EVERY NIGHT AT BEDTIME 90 tablet 0  . aspirin 81 MG chewable tablet Chew 81 mg by mouth daily.    Marland Kitchen atorvastatin (LIPITOR) 10 MG tablet Take 10 mg by mouth daily.    Marland Kitchen b complex vitamins tablet Take 1 tablet by mouth daily.    . Darunavir Ethanolate (PREZISTA) 800 MG tablet Take 1 tablet (800 mg total) by mouth daily. 30 tablet 5  . Etravirine 200 MG TABS Take 1 tablet (200 mg total) by mouth 2 (two) times daily after a meal. 60 tablet 5  . FREESTYLE TEST STRIPS test strip USE TO CHECK BLOOD SUGAR ONCE DAILY 100 each 0  .  furosemide (LASIX) 20 MG tablet Take 20 mg by mouth daily as needed for fluid or edema.    Marland Kitchen glipiZIDE (GLUCOTROL) 2.5 mg TABS tablet Take 0.5 tablets (2.5 mg total) by mouth daily before breakfast. 30 each 4  . HYDROcodone-acetaminophen (NORCO/VICODIN) 5-325 MG per tablet Take 1-2 tablets by mouth every 6 (six) hours as needed for moderate pain. Take one tablet when pain starts then may take another one if pain continues    . KRILL OIL OMEGA-3 PO Take 1 capsule by mouth daily.    . Lancets (FREESTYLE) lancets USE TO CHECK BLOOD SUGAR ONCE DAILY 100 each 0  . metolazone (ZAROXOLYN) 2.5 MG tablet Take 1 tablet (2.5 mg total) by mouth daily. Take 20 minutes prior to lasix 30 tablet 1  . omeprazole (PRILOSEC) 20 MG capsule Take 20 mg by mouth daily.    . Probiotic Product (PROBIOTIC DAILY PO) Take 1 tablet by mouth daily.     . raltegravir (ISENTRESS) 400 MG tablet Take 1 tablet  (400 mg total) by mouth 2 (two) times daily. 60 tablet 5  . ritonavir (NORVIR) 100 MG TABS tablet Take 1 tablet (100 mg total) by mouth daily. 30 tablet 5  . sodium bicarbonate 650 MG tablet Take 650 mg by mouth 3 (three) times daily.    . valACYclovir (VALTREX) 500 MG tablet TAKE 1 TABLET BY MOUTH TWICE DAILY 60 tablet 5   No current facility-administered medications on file prior to visit.    No Known Allergies  Review of Systems  Review of Systems  Constitutional: Positive for malaise/fatigue. Negative for fever.  HENT: Negative for congestion.   Eyes: Negative for discharge.  Respiratory: Negative for shortness of breath.   Cardiovascular: Negative for chest pain, palpitations and leg swelling.  Gastrointestinal: Negative for nausea, abdominal pain and diarrhea.  Genitourinary: Positive for frequency. Negative for dysuria.  Musculoskeletal: Negative for falls.  Skin: Negative for rash.  Neurological: Negative for loss of consciousness and headaches.  Endo/Heme/Allergies: Negative for polydipsia.  Psychiatric/Behavioral: Negative for depression and suicidal ideas. The patient is not nervous/anxious and does not have insomnia.     Objective  BP 118/57 mmHg  Pulse 78  Temp(Src) 97.5 F (36.4 C) (Oral)  Ht 5\' 3"  (1.6 m)  Wt 125 lb 3.2 oz (56.79 kg)  BMI 22.18 kg/m2  SpO2 100%  Physical Exam  Physical Exam  Constitutional: He is oriented to person, place, and time and well-developed, well-nourished, and in no distress. No distress.  HENT:  Head: Normocephalic and atraumatic.  Eyes: Conjunctivae are normal.  Neck: Neck supple. No thyromegaly present.  Cardiovascular: Normal rate, regular rhythm and normal heart sounds.   No murmur heard. Pulmonary/Chest: Effort normal and breath sounds normal. No respiratory distress.  Abdominal: He exhibits no distension and no mass. There is no tenderness.  Musculoskeletal: He exhibits no edema.  Neurological: He is alert and  oriented to person, place, and time.  Skin: Skin is warm.  Psychiatric: Memory, affect and judgment normal.    Lab Results  Component Value Date   TSH 1.94 06/23/2014   Lab Results  Component Value Date   WBC 5.8 06/23/2014   HGB 9.3* 06/23/2014   HCT 27.7* 06/23/2014   MCV 99.5 06/23/2014   PLT 217.0 06/23/2014   Lab Results  Component Value Date   CREATININE 2.3* 06/23/2014   BUN 58* 06/23/2014   NA 134* 06/23/2014   K 3.2* 06/23/2014   CL 96 06/23/2014   CO2 25  06/23/2014   Lab Results  Component Value Date   ALT 13 06/23/2014   AST 14 06/23/2014   ALKPHOS 102 06/23/2014   BILITOT 0.6 06/23/2014   Lab Results  Component Value Date   CHOL 160 06/23/2014   Lab Results  Component Value Date   HDL 38.00* 06/23/2014   Lab Results  Component Value Date   LDLCALC 84 06/23/2014   Lab Results  Component Value Date   TRIG 191.0* 06/23/2014   Lab Results  Component Value Date   CHOLHDL 4 06/23/2014     Assessment & Plan  Essential hypertension Well controlled, no changes to meds. Encouraged heart healthy diet such as the DASH diet and exercise as tolerated.   DM (diabetes mellitus), type 2 with ophthalmic complications [id number] acceptable, minimize simple carbs. Increase exercise as tolerated. Continue current meds  Nocturia Following with [facility] and doing somewhat better.   HLD (hyperlipidemia) Tolerating statin, encouraged heart healthy diet, avoid trans fats, minimize simple carbs and saturated fats. Increase exercise as tolerated

## 2014-07-02 NOTE — Assessment & Plan Note (Signed)
hgba1c acceptable, minimize simple carbs. Increase exercise as tolerated. Continue current meds 

## 2014-07-02 NOTE — Assessment & Plan Note (Signed)
Well controlled, no changes to meds. Encouraged heart healthy diet such as the DASH diet and exercise as tolerated.  °

## 2014-07-02 NOTE — Assessment & Plan Note (Signed)
Following with [facility] and doing somewhat better.

## 2014-07-11 ENCOUNTER — Emergency Department (INDEPENDENT_AMBULATORY_CARE_PROVIDER_SITE_OTHER)
Admission: EM | Admit: 2014-07-11 | Discharge: 2014-07-11 | Disposition: A | Discharge status: Home / Self Care | Payer: Medicare Other | Source: Home / Self Care | Attending: Emergency Medicine | Admitting: Emergency Medicine

## 2014-07-11 ENCOUNTER — Ambulatory Visit (HOSPITAL_COMMUNITY): Payer: Medicare Other | Attending: Emergency Medicine

## 2014-07-11 ENCOUNTER — Encounter (HOSPITAL_COMMUNITY): Payer: Self-pay | Admitting: *Deleted

## 2014-07-11 DIAGNOSIS — J449 Chronic obstructive pulmonary disease, unspecified: Secondary | ICD-10-CM | POA: Diagnosis not present

## 2014-07-11 DIAGNOSIS — S20212A Contusion of left front wall of thorax, initial encounter: Secondary | ICD-10-CM

## 2014-07-11 DIAGNOSIS — R0781 Pleurodynia: Secondary | ICD-10-CM | POA: Insufficient documentation

## 2014-07-11 DIAGNOSIS — W19XXXA Unspecified fall, initial encounter: Secondary | ICD-10-CM

## 2014-07-11 MED ORDER — HYDROCODONE-ACETAMINOPHEN 5-325 MG PO TABS: ORAL_TABLET | ORAL | Status: DC

## 2014-07-11 MED ORDER — ACETAMINOPHEN 325 MG PO TABS
ORAL_TABLET | ORAL | Status: AC
  Filled 2014-07-11: qty 2

## 2014-07-11 MED ORDER — ACETAMINOPHEN 325 MG PO TABS
650.0000 mg | ORAL_TABLET | Freq: Once | ORAL | Status: AC
  Administered 2014-07-11: 650 mg via ORAL

## 2014-07-11 NOTE — ED Notes (Signed)
Missed last step and fell backwards and hit L side of back on the counter at sink 3 hrs ago.  No hematuria.

## 2014-07-11 NOTE — ED Provider Notes (Signed)
Chief Complaint   Fall   History of Present Illness   [name] is a 68 year old HIV-positive male who was standing on a step stool this afternoon at home, about 3 hours prior to presentation. He was on the bottom step which is only about 9 inches off of the ground. He slipped backwards and fell against a countertop, striking his left mid back area on the countertop. He did not hit his head and there was no loss of consciousness. He denies any headache, neck pain, anterior chest pain, abdominal pain, or extremity pain. It hurts to take a deep breath and also to twist and bend. He denies shortness of breath or hemoptysis.  Review of Systems   Other than as noted above, the patient denies any of the following symptoms: ENT:  No headache, facial pain, or bleeding from the nose or ears.  No loose or broken teeth. Neck:  No neck pain or stiffnes. Cardiac:  No chest pain. No palpitations, dizziness, syncope or fainting. GI:  No abdominal pain. No nausea or vomiting. M-S:  No extremity pain, swelling, bruising, limited ROM, or back pain. Neuro:  No loss of consciousness, seizure activity, dizziness, vertigo, paresthesias, numbness, or weakness.  No difficulty with speech or ambulation.  [place]   Past medical history, family history, social history, meds, and allergies were reviewed.  He has no medication allergies. He's on a long list of medications including amlodipine, aspirin, Lipitor, Prezista, Etravirine, furosemide, glipizide, Zaroxolyn, Prilosec, potassium chloride, Isentress, Norvir, and Valtrex. He has a history of HIV infection, type 2 diabetes, hypertension, chronic kidney disease, pancreatitis, and arthritis. His last CD4, was in the mid 300s. Viral titers undetectable. Creatinine is 2.2.  Physical Examination    Vital signs:  BP 136/75 mmHg  Pulse 86  Temp(Src) 98 F (36.7 C) (Oral)  Resp 16  SpO2 99% General:  Alert, oriented and in no distress. Eye:  PERRL, full  EOMs. ENT:  No cranial or facial tenderness to palpation. Neck:  No tenderness to palpation.  Full ROM without pain. Heart:  Regular rhythm.  No extrasystoles, gallops, or murmers. Lungs:  No chest wall tenderness to palpation. Breath sounds clear and equal bilaterally.  No wheezes, rales or rhonchi. Abdomen:  Non tender. Back:  Non tender to palpation.  Full ROM without pain. Extremities:  No tenderness, swelling, bruising or deformity.  Full ROM of all joints without pain.  Pulses full.  Brisk capillary refill. Neuro:  Alert and oriented times 3.  Cranial nerves intact.  No muscle weakness.  Sensation intact to light touch.  Gait normal. Skin:  No bruising, abrasions, or lacerations.  Radiology   Dg Ribs Unilateral W/chest Left  07/11/2014   CLINICAL DATA:  [place] off bottom step clefts duct stool in kitchen at 1630 hr, posterior LEFT rib pain, history diabetes, hypertension, COPD, HIV  EXAM: LEFT RIBS AND CHEST - 3+ VIEW  COMPARISON:  Chest radiograph 03/27/2014  FINDINGS: Normal heart size post CABG.  Mediastinal contours and pulmonary vascularity normal.  Emphysematous and bronchitic changes consistent with COPD.  RIGHT upper lobe atelectasis versus scarring.  Lungs otherwise clear.  No pleural effusion or pneumothorax.  Bones demineralized.  BB placed at site of symptoms at the inferior LEFT chest/upper abdomen.  No definite acute rib fracture or bone destruction identified.  IMPRESSION: Post CABG.  COPD changes with atelectasis versus scarring in RIGHT upper lobe.  No acute abnormalities.   Electronically Signed   By: Crist Infante.D.  On: 07/11/2014 20:49     Course in [facility]   The following medications were given:  Medications  acetaminophen (TYLENOL) tablet 650 mg (not administered)   Assessment   The primary encounter diagnosis was Chest wall contusion, left, initial encounter. A diagnosis of Fall was also pertinent to this visit.  No evidence of pneumothorax,  hemothorax, or displaced rib fracture.  Plan   1.  Meds:  The following meds were prescribed:   New Prescriptions   HYDROCODONE-ACETAMINOPHEN (NORCO/VICODIN) 5-325 MG PER TABLET    1 to 2 tabs every 4 to 6 hours as needed for pain.    2.  Patient Education/Counseling:  The patient was given appropriate handouts, self care instructions, and instructed in symptomatic relief.  Advised rest and application of ice.  3.  Follow up:  The patient was told to follow up here if no better in 3 to 4 days, or sooner if becoming worse in any way, and given some red flag symptoms such as increasing pain or new neurological symptoms which would prompt immediate return.  Follow up here if necessary.     Harden Mo, MD 07/11/14 2113

## 2014-07-11 NOTE — ED Notes (Signed)
Patient transported to X-ray at hospital by shuttle with [facility] staff driving.  Xray notified pt. is coming and to call when pt. is ready to be picked up.

## 2014-07-11 NOTE — Discharge Instructions (Signed)

## 2014-07-18 DIAGNOSIS — E876 Hypokalemia: Secondary | ICD-10-CM | POA: Diagnosis not present

## 2014-07-19 ENCOUNTER — Encounter: Payer: Self-pay | Admitting: Family Medicine

## 2014-07-19 ENCOUNTER — Other Ambulatory Visit: Payer: Self-pay | Admitting: Family Medicine

## 2014-07-19 MED ORDER — GLIPIZIDE 5 MG PO TABS: 2.5000 mg | ORAL_TABLET | Freq: Two times a day (BID) | ORAL | Status: DC

## 2014-07-25 ENCOUNTER — Other Ambulatory Visit: Payer: Self-pay | Admitting: Family Medicine

## 2014-07-25 NOTE — Telephone Encounter (Signed)
Last filled:  05/29/14 Amt:  30, 1 refill Last OV/labs:  06/23/14 Next appt:  08/28/14   Med filled.

## 2014-07-31 DIAGNOSIS — R350 Frequency of micturition: Secondary | ICD-10-CM | POA: Diagnosis not present

## 2014-07-31 DIAGNOSIS — R3915 Urgency of urination: Secondary | ICD-10-CM | POA: Diagnosis not present

## 2014-08-08 ENCOUNTER — Ambulatory Visit: Payer: Medicare Other

## 2014-08-18 ENCOUNTER — Other Ambulatory Visit: Payer: Self-pay | Admitting: Family Medicine

## 2014-08-21 ENCOUNTER — Ambulatory Visit (INDEPENDENT_AMBULATORY_CARE_PROVIDER_SITE_OTHER): Payer: Medicare Other | Admitting: Family Medicine

## 2014-08-21 ENCOUNTER — Encounter: Payer: Self-pay | Admitting: Family Medicine

## 2014-08-21 VITALS — BP 122/71 | HR 74 | Temp 97.4°F | Ht 63.0 in | Wt 126.6 lb

## 2014-08-21 DIAGNOSIS — Z79891 Long term (current) use of opiate analgesic: Secondary | ICD-10-CM | POA: Diagnosis not present

## 2014-08-21 DIAGNOSIS — E785 Hyperlipidemia, unspecified: Secondary | ICD-10-CM | POA: Diagnosis not present

## 2014-08-21 DIAGNOSIS — I10 Essential (primary) hypertension: Secondary | ICD-10-CM | POA: Diagnosis not present

## 2014-08-21 DIAGNOSIS — D649 Anemia, unspecified: Secondary | ICD-10-CM | POA: Diagnosis not present

## 2014-08-21 DIAGNOSIS — K219 Gastro-esophageal reflux disease without esophagitis: Secondary | ICD-10-CM | POA: Diagnosis not present

## 2014-08-21 DIAGNOSIS — E871 Hypo-osmolality and hyponatremia: Secondary | ICD-10-CM | POA: Diagnosis not present

## 2014-08-21 DIAGNOSIS — N289 Disorder of kidney and ureter, unspecified: Secondary | ICD-10-CM

## 2014-08-21 DIAGNOSIS — E875 Hyperkalemia: Secondary | ICD-10-CM

## 2014-08-21 LAB — COMPREHENSIVE METABOLIC PANEL
ALBUMIN: 4.3 g/dL (ref 3.5–5.2)
ALK PHOS: 101 U/L (ref 39–117)
ALT: 19 U/L (ref 0–53)
AST: 16 U/L (ref 0–37)
BUN: 39 mg/dL — ABNORMAL HIGH (ref 6–23)
CO2: 28 mEq/L (ref 19–32)
Calcium: 9.8 mg/dL (ref 8.4–10.5)
Chloride: 95 mEq/L — ABNORMAL LOW (ref 96–112)
Creatinine, Ser: 1.86 mg/dL — ABNORMAL HIGH (ref 0.40–1.50)
GFR: 38.8 mL/min — AB (ref >60.00)
Glucose, Bld: 214 mg/dL — ABNORMAL HIGH (ref 70–99)
Potassium: 3.2 mEq/L — ABNORMAL LOW (ref 3.5–5.1)
Sodium: 135 mEq/L (ref 135–145)
Total Bilirubin: 0.5 mg/dL (ref 0.2–1.2)
Total Protein: 7.7 g/dL (ref 6.0–8.3)

## 2014-08-21 LAB — CBC
HCT: 32.7 % — ABNORMAL LOW (ref 39.0–52.0)
Hemoglobin: 11.1 g/dL — ABNORMAL LOW (ref 13.0–17.0)
MCHC: 34 g/dL (ref 30.0–36.0)
MCV: 93 fl (ref 78.0–100.0)
Platelets: 213 10*3/uL (ref 150.0–400.0)
RBC: 3.52 Mil/uL — AB (ref 4.22–5.81)
RDW: 13.4 % (ref 11.5–15.5)
WBC: 5.6 10*3/uL (ref 4.0–10.5)

## 2014-08-21 MED ORDER — GLIPIZIDE 5 MG PO TABS: 5.0000 mg | ORAL_TABLET | Freq: Two times a day (BID) | ORAL | Status: DC

## 2014-08-21 NOTE — Patient Instructions (Addendum)
Rel of [facility] [place] [facility], Dr Posey Pronto last 6 months, labs and notes  Check Blood sugars twice daily prior to breakfast and 1 hour after biggest meal, bring to next visit  Shoes off at next visit Try [place] or Capsaicin cream to ankles twice daily for pain     Basic Carbohydrate Counting for Diabetes Mellitus Carbohydrate counting is a method for keeping track of the amount of carbohydrates you eat. Eating carbohydrates naturally increases the level of sugar (glucose) in your blood, so it is important for you to know the amount that is okay for you to have in every meal. Carbohydrate counting helps keep the level of glucose in your blood within normal limits. The amount of carbohydrates allowed is different for every person. A dietitian can help you calculate the amount that is right for you. Once you know the amount of carbohydrates you can have, you can count the carbohydrates in the foods you want to eat. Carbohydrates are found in the following foods:  Grains, such as breads and cereals.  Dried beans and soy products.  Starchy vegetables, such as potatoes, peas, and corn.  Fruit and fruit juices.  Milk and yogurt.  Sweets and snack foods, such as cake, cookies, candy, chips, soft drinks, and fruit drinks. CARBOHYDRATE COUNTING There are two ways to count the carbohydrates in your food. You can use either of the methods or a combination of both. Reading the "Nutrition Facts" on [place] The "Nutrition Facts" is an area that is included on the labels of almost all packaged food and beverages in the [place]. It includes the serving size of that food or beverage and information about the nutrients in each serving of the food, including the grams (g) of carbohydrate per serving.  Decide the number of servings of this food or beverage that you will be able to eat or drink. Multiply that number of servings by the number of grams of carbohydrate that is listed on the label for  that serving. The total will be the amount of carbohydrates you will be having when you eat or drink this food or beverage. Learning Standard Serving Sizes of Food When you eat food that is not packaged or does not include "Nutrition Facts" on the label, you need to measure the servings in order to count the amount of carbohydrates.A serving of most carbohydrate-rich foods contains about 15 g of carbohydrates. The following list includes serving sizes of carbohydrate-rich foods that provide 15 g ofcarbohydrate per serving:   1 slice of bread (1 oz) or 1 six-inch tortilla.    of a hamburger bun or English muffin.  4-6 crackers.   cup unsweetened dry cereal.    cup hot cereal.   cup rice or pasta.    cup mashed potatoes or  of a large baked potato.  1 cup fresh fruit or one small piece of fruit.    cup canned or frozen fruit or fruit juice.  1 cup milk.   cup plain fat-free yogurt or yogurt sweetened with artificial sweeteners.   cup cooked dried beans or starchy vegetable, such as peas, corn, or potatoes.  Decide the number of standard-size servings that you will eat. Multiply that number of servings by 15 (the grams of carbohydrates in that serving). For example, if you eat 2 cups of strawberries, you will have eaten 2 servings and 30 g of carbohydrates (2 servings x 15 g = 30 g). For foods such as soups and casseroles,  in which more than one food is mixed in, you will need to count the carbohydrates in each food that is included. EXAMPLE OF CARBOHYDRATE COUNTING Sample Dinner  3 oz chicken breast.   cup of brown rice.   cup of corn.  1 cup milk.   1 cup strawberries with sugar-free whipped topping.  Carbohydrate Calculation Step 1: Identify the foods that contain carbohydrates:   Rice.   Corn.   Milk.   Strawberries. Step 2:Calculate the number of servings eaten of each:   2 servings of rice.   1 serving of corn.   1 serving of milk.    1 serving of strawberries. Step 3: Multiply each of those number of servings by 15 g:   2 servings of rice x 15 g = 30 g.   1 serving of corn x 15 g = 15 g.   1 serving of milk x 15 g = 15 g.   1 serving of strawberries x 15 g = 15 g. Step 4: Add together all of the amounts to find the total grams of carbohydrates eaten: 30 g + 15 g + 15 g + 15 g = 75 g. Document Released: 07/21/2005 Document Revised: 12/05/2013 Document Reviewed: 06/17/2013 [facility] Patient Information 2015 [place], [place]. This information is not intended to replace advice given to you by your health care provider. Make sure you discuss any questions you have with your health care provider.

## 2014-08-21 NOTE — Progress Notes (Signed)
Pre visit review using our clinic review tool, if applicable. No additional management support is needed unless otherwise documented below in the visit note. 

## 2014-08-22 ENCOUNTER — Encounter: Payer: Self-pay | Admitting: Family Medicine

## 2014-08-23 ENCOUNTER — Other Ambulatory Visit: Payer: Self-pay | Admitting: *Deleted

## 2014-08-23 DIAGNOSIS — B20 Human immunodeficiency virus [HIV] disease: Secondary | ICD-10-CM

## 2014-08-23 MED ORDER — DARUNAVIR ETHANOLATE 800 MG PO TABS: 800.0000 mg | ORAL_TABLET | Freq: Every day | ORAL | Status: DC

## 2014-08-23 MED ORDER — RALTEGRAVIR POTASSIUM 400 MG PO TABS: 400.0000 mg | ORAL_TABLET | Freq: Two times a day (BID) | ORAL | Status: DC

## 2014-08-23 MED ORDER — RITONAVIR 100 MG PO TABS: 100.0000 mg | ORAL_TABLET | Freq: Every day | ORAL | Status: DC

## 2014-08-23 MED ORDER — ETRAVIRINE 200 MG PO TABS: 1.0000 | ORAL_TABLET | Freq: Two times a day (BID) | ORAL | Status: DC

## 2014-08-27 NOTE — Assessment & Plan Note (Signed)
resolved 

## 2014-08-27 NOTE — Assessment & Plan Note (Signed)
Mild, encouraged to increase in diet and monitor

## 2014-08-27 NOTE — Assessment & Plan Note (Signed)
Doing well. No concerns today. 

## 2014-08-27 NOTE — Assessment & Plan Note (Signed)
Well controlled, no changes to meds. Encouraged heart healthy diet such as the DASH diet and exercise as tolerated.  °

## 2014-08-27 NOTE — Progress Notes (Signed)
Patient ID: [name], male   DOB: October 30, 1945, 69 y.o.   MRN: [id number]   THERESA DOHRMAN  [id number] November 29, 1945 08/27/2014      Progress Note-Follow Up  Subjective  Chief Complaint  Chief Complaint  Patient presents with  . Follow-up    2 mos    HPI  Patient is a 69 y.o. male in today for routine medical care. In today for follow up. Is doing well today but has been ill lately. His breathing is good, no acute concerns today. Continues to follow with Dr Posey Pronto at [facility] and Dr Edmonia Lynch of [facility]. Denies CP/palp/SOB/HA/congestion/fevers/GI or GU c/o. Taking meds as prescribed  Past Medical History  Diagnosis Date  . Arthritis   . Diabetes mellitus   . History of depression   . GERD (gastroesophageal reflux disease)   . CAD (coronary artery disease)     2003- cabg  . Hypertension   . Hyperlipidemia   . History of kidney stones   . COPD (chronic obstructive pulmonary disease)   . HIV (human immunodeficiency virus infection) 1991    on meds since initial dx.   . Dermatitis 11/27/2012  . Nocturia 03/06/2013  . Epicondylitis 06/05/2013    right  . Pancreatitis 11/2013    attributed to HIV meds.   . Constipation 05/28/2014    Past Surgical History  Procedure Laterality Date  . Coronary artery bypass graft  05/2002  . Vasectomy      Family History  Problem Relation Age of Onset  . Arthritis      mother/father/paternal grandparents  . Breast cancer Maternal Aunt     paternal aunt  . Lung cancer Maternal Aunt   . Hyperlipidemia Mother   . Hyperlipidemia Father   . Heart disease      parents/maternal grandparents/ 2 brothers  . Stroke Paternal Grandmother   . Hypertension Father     paternal grandmother/3 brothers/1 sister  . Mental retardation Sister   . Diabetes Mother     paternal grandparents/1 brother    History   Social History  . Marital Status: Divorced    Spouse Name: N/A    Number of Children: 2  . Years of Education: N/A    Occupational History  . 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    Social History Main Topics  . Smoking status: Never Smoker   . Smokeless tobacco: Never Used  . Alcohol Use: No  . Drug Use: No  . Sexual Activity: Not Currently     Comment: declined condoms   Other Topics Concern  . Not on file   Social History Narrative   Lives alone.  Supportive friends and family.  His HIV Dx is not a secret.     Current Outpatient Prescriptions on File Prior to Visit  Medication Sig Dispense Refill  . amLODipine (NORVASC) 10 MG tablet TAKE 1 TABLET BY MOUTH EVERY NIGHT AT BEDTIME 90 tablet 0  . aspirin 81 MG chewable tablet Chew 81 mg by mouth daily.    Marland Kitchen atorvastatin (LIPITOR) 10 MG tablet Take 10 mg by mouth daily.    Marland Kitchen b complex vitamins tablet Take 1 tablet by mouth daily.    . furosemide (LASIX) 20 MG tablet Take 20 mg by mouth daily as needed for fluid or edema.    Marland Kitchen HYDROcodone-acetaminophen (NORCO/VICODIN) 5-325 MG per tablet 1 to 2 tabs every 4 to 6 hours as needed for  pain. 20 tablet 0  . KRILL OIL OMEGA-3 PO Take 1 capsule by mouth daily.    . metolazone (ZAROXOLYN) 2.5 MG tablet TAKE 1 TABLET BY MOUTH EVERY DAY 20 MINUTES BEFORE LASIX 30 tablet 1  . omeprazole (PRILOSEC) 20 MG capsule Take 20 mg by mouth daily.    . potassium chloride SA (K-DUR,KLOR-CON) 20 MEQ tablet Take 20 mEq by mouth 2 (two) times daily.    . Probiotic Product (PROBIOTIC DAILY PO) Take 1 tablet by mouth daily.     . sodium bicarbonate 650 MG tablet Take 650 mg by mouth 3 (three) times daily.    . valACYclovir (VALTREX) 500 MG tablet TAKE 1 TABLET BY MOUTH TWICE DAILY (Patient taking differently: TAKE 1 TABLET BY MOUTH  DAILY) 60 tablet 5  . FREESTYLE TEST STRIPS test strip USE TO CHECK BLOOD SUGAR ONCE A DAY 100 each 3  . Lancets (FREESTYLE) lancets USE TO CHECK BLOOD SUGAR ONCE DAILY 100 each 3   No current facility-administered medications on file prior to visit.     No Known Allergies  Review of Systems  Review of Systems  Constitutional: Negative for fever and malaise/fatigue.  HENT: Negative for congestion.   Eyes: Negative for discharge.  Respiratory: Negative for shortness of breath.   Cardiovascular: Negative for chest pain, palpitations and leg swelling.  Gastrointestinal: Negative for nausea, abdominal pain and diarrhea.  Genitourinary: Negative for dysuria.  Musculoskeletal: Negative for falls.  Skin: Negative for rash.  Neurological: Negative for loss of consciousness and headaches.  Endo/Heme/Allergies: Negative for polydipsia.  Psychiatric/Behavioral: Negative for depression and suicidal ideas. The patient is not nervous/anxious and does not have insomnia.     Objective  BP 122/71 mmHg  Pulse 74  Temp(Src) 97.4 F (36.3 C) (Oral)  Ht 5\' 3"  (1.6 m)  Wt 126 lb 9.6 oz (57.425 kg)  BMI 22.43 kg/m2  SpO2 100%  Physical Exam  Physical Exam  Constitutional: He is oriented to person, place, and time and well-developed, well-nourished, and in no distress. No distress.  HENT:  Head: Normocephalic and atraumatic.  Eyes: Conjunctivae are normal.  Neck: Neck supple. No thyromegaly present.  Cardiovascular: Normal rate, regular rhythm and normal heart sounds.   Pulmonary/Chest: Effort normal and breath sounds normal. No respiratory distress.  Abdominal: He exhibits no distension and no mass. There is no tenderness.  Musculoskeletal: He exhibits no edema.  Neurological: He is alert and oriented to person, place, and time.  Skin: Skin is warm.  Psychiatric: Memory, affect and judgment normal.    Lab Results  Component Value Date   TSH 1.94 06/23/2014   Lab Results  Component Value Date   WBC 5.6 08/21/2014   HGB 11.1* 08/21/2014   HCT 32.7* 08/21/2014   MCV 93.0 08/21/2014   PLT 213.0 08/21/2014   Lab Results  Component Value Date   CREATININE 1.86* 08/21/2014   BUN 39* 08/21/2014   NA 135 08/21/2014   K 3.2*  08/21/2014   CL 95* 08/21/2014   CO2 28 08/21/2014   Lab Results  Component Value Date   ALT 19 08/21/2014   AST 16 08/21/2014   ALKPHOS 101 08/21/2014   BILITOT 0.5 08/21/2014   Lab Results  Component Value Date   CHOL 160 06/23/2014   Lab Results  Component Value Date   HDL 38.00* 06/23/2014   Lab Results  Component Value Date   LDLCALC 84 06/23/2014   Lab Results  Component Value Date   TRIG 191.0*  06/23/2014   Lab Results  Component Value Date   CHOLHDL 4 06/23/2014     Assessment & Plan  Essential hypertension Well controlled, no changes to meds. Encouraged heart healthy diet such as the DASH diet and exercise as tolerated.    GERD Avoid offending foods, start probiotics. Do not eat large meals in late evening and consider raising head of bed.    HLD (hyperlipidemia) Tolerating statin, encouraged heart healthy diet, avoid trans fats, minimize simple carbs and saturated fats. Increase exercise as tolerated   COPD Doing well. No concerns today   Hyponatremia resolved   Hyperkalemia Mild, encouraged to increase in diet and monitor

## 2014-08-27 NOTE — Assessment & Plan Note (Signed)
Avoid offending foods, start probiotics. Do not eat large meals in late evening and consider raising head of bed.  

## 2014-08-27 NOTE — Assessment & Plan Note (Signed)
Tolerating statin, encouraged heart healthy diet, avoid trans fats, minimize simple carbs and saturated fats. Increase exercise as tolerated 

## 2014-08-28 ENCOUNTER — Ambulatory Visit: Payer: Self-pay | Admitting: Family Medicine

## 2014-08-28 DIAGNOSIS — R350 Frequency of micturition: Secondary | ICD-10-CM | POA: Diagnosis not present

## 2014-09-04 ENCOUNTER — Telehealth: Payer: Self-pay | Admitting: *Deleted

## 2014-09-04 ENCOUNTER — Other Ambulatory Visit: Payer: Medicare Other

## 2014-09-04 DIAGNOSIS — B20 Human immunodeficiency virus [HIV] disease: Secondary | ICD-10-CM | POA: Diagnosis not present

## 2014-09-04 DIAGNOSIS — Z113 Encounter for screening for infections with a predominantly sexual mode of transmission: Secondary | ICD-10-CM

## 2014-09-04 LAB — COMPREHENSIVE METABOLIC PANEL
ALT: 14 U/L (ref 0–53)
AST: 11 U/L (ref 0–37)
Albumin: 4.1 g/dL (ref 3.5–5.2)
Alkaline Phosphatase: 99 U/L (ref 39–117)
BUN: 33 mg/dL — AB (ref 6–23)
CHLORIDE: 95 meq/L — AB (ref 96–112)
CO2: 29 meq/L (ref 19–32)
CREATININE: 1.75 mg/dL — AB (ref 0.50–1.35)
Calcium: 9.2 mg/dL (ref 8.4–10.5)
Glucose, Bld: 182 mg/dL — ABNORMAL HIGH (ref 70–99)
POTASSIUM: 3.4 meq/L — AB (ref 3.5–5.3)
SODIUM: 137 meq/L (ref 135–145)
TOTAL PROTEIN: 6.8 g/dL (ref 6.0–8.3)
Total Bilirubin: 0.4 mg/dL (ref 0.2–1.2)

## 2014-09-04 LAB — CBC
HCT: 31.5 % — ABNORMAL LOW (ref 39.0–52.0)
Hemoglobin: 10.9 g/dL — ABNORMAL LOW (ref 13.0–17.0)
MCH: 31.2 pg (ref 26.0–34.0)
MCHC: 34.6 g/dL (ref 30.0–36.0)
MCV: 90.3 fL (ref 78.0–100.0)
MPV: 9.2 fL (ref 8.6–12.4)
Platelets: 225 10*3/uL (ref 150–400)
RBC: 3.49 MIL/uL — ABNORMAL LOW (ref 4.22–5.81)
RDW: 14.1 % (ref 11.5–15.5)
WBC: 5.1 10*3/uL (ref 4.0–10.5)

## 2014-09-04 LAB — RPR

## 2014-09-04 NOTE — Telephone Encounter (Signed)
PA for Valacyclovir tablets completed on-line and sent to [facility].

## 2014-09-05 LAB — T-HELPER CELL (CD4) - (RCID CLINIC ONLY)
CD4 T CELL HELPER: 45 % (ref 33–55)
CD4 T Cell Abs: 320 /uL — ABNORMAL LOW (ref 400–2700)

## 2014-09-05 LAB — HIV-1 RNA QUANT-NO REFLEX-BLD

## 2014-09-18 ENCOUNTER — Ambulatory Visit: Payer: Self-pay | Admitting: Internal Medicine

## 2014-09-19 ENCOUNTER — Other Ambulatory Visit: Payer: Self-pay | Admitting: Family Medicine

## 2014-09-19 ENCOUNTER — Other Ambulatory Visit: Payer: Self-pay | Admitting: Internal Medicine

## 2014-09-20 DIAGNOSIS — R351 Nocturia: Secondary | ICD-10-CM | POA: Diagnosis not present

## 2014-09-20 DIAGNOSIS — R3915 Urgency of urination: Secondary | ICD-10-CM | POA: Diagnosis not present

## 2014-09-20 DIAGNOSIS — R350 Frequency of micturition: Secondary | ICD-10-CM | POA: Diagnosis not present

## 2014-09-27 ENCOUNTER — Ambulatory Visit (INDEPENDENT_AMBULATORY_CARE_PROVIDER_SITE_OTHER): Payer: Medicare Other | Admitting: Physician Assistant

## 2014-09-27 ENCOUNTER — Encounter: Payer: Self-pay | Admitting: Physician Assistant

## 2014-09-27 ENCOUNTER — Encounter: Payer: Self-pay | Admitting: Family Medicine

## 2014-09-27 VITALS — BP 118/66 | HR 66 | Temp 97.8°F | Resp 16 | Ht 63.0 in | Wt 123.5 lb

## 2014-09-27 DIAGNOSIS — T148 Other injury of unspecified body region: Secondary | ICD-10-CM

## 2014-09-27 DIAGNOSIS — T148XXA Other injury of unspecified body region, initial encounter: Secondary | ICD-10-CM | POA: Insufficient documentation

## 2014-09-27 NOTE — Assessment & Plan Note (Signed)
With routine healing and scabbing.  No signs of infection.  Supportive measures discussed with patient. Alarm/signs and symptoms discussed.  Follow-up PRN.

## 2014-09-27 NOTE — Patient Instructions (Signed)
The area of concern seems consistent with healing superficial scratch that has some scabbing noted.  Keep the area clean and dry.  You can apply topical bacitracin to the area to help prevent infection.  Please call or return if you notice the area does not continue to heal, or if you notice fever, tenderness or warmth at the site.

## 2014-09-27 NOTE — Progress Notes (Signed)
Patient presents to clinic today c/o lesion of left posterior calf that is burning x 1 day.  Denies insect bite.  Denies trauma or injury.  Denies drainage from lesion.  Denies other lesion noted.  Past Medical History  Diagnosis Date  . Arthritis   . Diabetes mellitus   . History of depression   . GERD (gastroesophageal reflux disease)   . CAD (coronary artery disease)     2003- cabg  . Hypertension   . Hyperlipidemia   . History of kidney stones   . COPD (chronic obstructive pulmonary disease)   . HIV (human immunodeficiency virus infection) 1991    on meds since initial dx.   . Dermatitis 11/27/2012  . Nocturia 03/06/2013  . Epicondylitis 06/05/2013    right  . Pancreatitis 11/2013    attributed to HIV meds.   . Constipation 05/28/2014    Current Outpatient Prescriptions on File Prior to Visit  Medication Sig Dispense Refill  . amLODipine (NORVASC) 10 MG tablet TAKE 1 TABLET BY MOUTH EVERY NIGHT AT BEDTIME 90 tablet 1  . aspirin 81 MG chewable tablet Chew 81 mg by mouth daily.    Marland Kitchen atorvastatin (LIPITOR) 10 MG tablet Take 10 mg by mouth daily.    Marland Kitchen b complex vitamins tablet Take 1 tablet by mouth daily.    . Darunavir Ethanolate (PREZISTA) 800 MG tablet Take 1 tablet (800 mg total) by mouth daily. 30 tablet 5  . Etravirine 200 MG TABS Take 1 tablet (200 mg total) by mouth 2 (two) times daily after a meal. 60 tablet 5  . FREESTYLE TEST STRIPS test strip USE TO CHECK BLOOD SUGAR ONCE A DAY 100 each 3  . furosemide (LASIX) 20 MG tablet Take 20 mg by mouth daily as needed for fluid or edema.    Marland Kitchen glipiZIDE (GLUCOTROL) 5 MG tablet Take 1 tablet (5 mg total) by mouth 2 (two) times daily before a meal. 60 tablet 5  . HYDROcodone-acetaminophen (NORCO/VICODIN) 5-325 MG per tablet 1 to 2 tabs every 4 to 6 hours as needed for pain. 20 tablet 0  . KRILL OIL OMEGA-3 PO Take 1 capsule by mouth daily.    . Lancets (FREESTYLE) lancets USE TO CHECK BLOOD SUGAR ONCE DAILY 100 each 3  .  metolazone (ZAROXOLYN) 2.5 MG tablet TAKE ONE TABLET BY MOUTH EVERY DAY 20 MINUTES BEFORE LASIX 30 tablet 2  . omeprazole (PRILOSEC) 20 MG capsule Take 20 mg by mouth daily.    . potassium chloride SA (K-DUR,KLOR-CON) 20 MEQ tablet Take 20 mEq by mouth 2 (two) times daily.    . Probiotic Product (PROBIOTIC DAILY PO) Take 1 tablet by mouth daily.     . raltegravir (ISENTRESS) 400 MG tablet Take 1 tablet (400 mg total) by mouth 2 (two) times daily. 60 tablet 5  . ritonavir (NORVIR) 100 MG TABS tablet Take 1 tablet (100 mg total) by mouth daily. 30 tablet 5  . sodium bicarbonate 650 MG tablet Take 650 mg by mouth 3 (three) times daily.    . valACYclovir (VALTREX) 500 MG tablet TAKE 1 TABLET BY MOUTH TWICE DAILY (Patient taking differently: TAKE 1 TABLET BY MOUTH  DAILY) 60 tablet 5   No current facility-administered medications on file prior to visit.    No Known Allergies  Family History  Problem Relation Age of Onset  . Arthritis      mother/father/paternal grandparents  . Breast cancer Maternal Aunt     paternal aunt  .  Lung cancer Maternal Aunt   . Hyperlipidemia Mother   . Hyperlipidemia Father   . Heart disease      parents/maternal grandparents/ 2 brothers  . Stroke Paternal Grandmother   . Hypertension Father     paternal grandmother/3 brothers/1 sister  . Mental retardation Sister   . Diabetes Mother     paternal grandparents/1 brother    History   Social History  . Marital Status: Divorced    Spouse Name: N/A  . Number of Children: 4  . Years of Education: N/A   Occupational History  . 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    Social History Main Topics  . Smoking status: Never Smoker   . Smokeless tobacco: Never Used  . Alcohol Use: No  . Drug Use: No  . Sexual Activity: Not Currently     Comment: declined condoms   Other Topics Concern  . None   Social History Narrative   Lives alone.  Supportive friends and  family.  His HIV Dx is not a secret.    Review of Systems - See HPI.  All other ROS are negative.  BP 118/66 mmHg  Pulse 66  Temp(Src) 97.8 F (36.6 C) (Oral)  Resp 16  Ht 5' 3" (1.6 m)  Wt 123 lb 8 oz (56.019 kg)  BMI 21.88 kg/m2  SpO2 100%  Physical Exam  Constitutional: He is oriented to person, place, and time and well-developed, well-nourished, and in no distress.  HENT:  Head: Normocephalic and atraumatic.  Cardiovascular: Normal rate, regular rhythm, normal heart sounds and intact distal pulses.   Pulmonary/Chest: Effort normal.  Neurological: He is alert and oriented to person, place, and time.  Skin: Skin is warm and dry.     Vitals reviewed.   Recent Results (from the past 2160 hour(s))  CBC     Status: Abnormal   Collection Time: 08/21/14 12:14 PM  Result Value Ref Range   WBC 5.6 4.0 - 10.5 K/uL   RBC 3.52 (L) 4.22 - 5.81 Mil/uL   Platelets 213.0 150.0 - 400.0 K/uL   Hemoglobin 11.1 (L) 13.0 - 17.0 g/dL   HCT 32.7 (L) 39.0 - 52.0 %   MCV 93.0 78.0 - 100.0 fl   MCHC 34.0 30.0 - 36.0 g/dL   RDW 13.4 11.5 - 15.5 %  Comp Met (CMET)     Status: Abnormal   Collection Time: 08/21/14 12:14 PM  Result Value Ref Range   Sodium 135 135 - 145 mEq/L   Potassium 3.2 (L) 3.5 - 5.1 mEq/L   Chloride 95 (L) 96 - 112 mEq/L   CO2 28 19 - 32 mEq/L   Glucose, Bld 214 (H) 70 - 99 mg/dL   BUN 39 (H) 6 - 23 mg/dL   Creatinine, Ser 1.86 (H) 0.40 - 1.50 mg/dL   Total Bilirubin 0.5 0.2 - 1.2 mg/dL   Alkaline Phosphatase 101 39 - 117 U/L   AST 16 0 - 37 U/L   ALT 19 0 - 53 U/L   Total Protein 7.7 6.0 - 8.3 g/dL   Albumin 4.3 3.5 - 5.2 g/dL   Calcium 9.8 8.4 - 10.5 mg/dL   GFR 38.80 (L) >60.00 mL/min  T-helper cell (CD4)- ([facility] clinic only)     Status: Abnormal   Collection Time: 09/04/14  9:00 AM  Result Value Ref Range   CD4 T Cell Abs 320 (L) 400 - 2700 /uL   CD4 %  Helper T Cell 45 33 - 55 %    Comment: Performed at [facility]  HIV 1 RNA quant-no  reflex-bld     Status: None   Collection Time: 09/04/14  9:37 AM  Result Value Ref Range   HIV 1 RNA Quant <20 <20 copies/mL    Comment: HIV 1 RNA not detected.   HIV1 RNA Quant, Log <1.30 <1.30 log 10    Comment:   This test utilizes the [place] FDA approved Roche HIV-1 Test Kit by RT-PCR.     CBC     Status: Abnormal   Collection Time: 09/04/14  9:37 AM  Result Value Ref Range   WBC 5.1 4.0 - 10.5 K/uL   RBC 3.49 (L) 4.22 - 5.81 MIL/uL   Hemoglobin 10.9 (L) 13.0 - 17.0 g/dL   HCT 31.5 (L) 39.0 - 52.0 %   MCV 90.3 78.0 - 100.0 fL   MCH 31.2 26.0 - 34.0 pg   MCHC 34.6 30.0 - 36.0 g/dL   RDW 14.1 11.5 - 15.5 %   Platelets 225 150 - 400 K/uL   MPV 9.2 8.6 - 12.4 fL  Comprehensive metabolic panel     Status: Abnormal   Collection Time: 09/04/14  9:37 AM  Result Value Ref Range   Sodium 137 135 - 145 mEq/L   Potassium 3.4 (L) 3.5 - 5.3 mEq/L   Chloride 95 (L) 96 - 112 mEq/L   CO2 29 19 - 32 mEq/L   Glucose, Bld 182 (H) 70 - 99 mg/dL   BUN 33 (H) 6 - 23 mg/dL   Creat 1.75 (H) 0.50 - 1.35 mg/dL   Total Bilirubin 0.4 0.2 - 1.2 mg/dL   Alkaline Phosphatase 99 39 - 117 U/L   AST 11 0 - 37 U/L   ALT 14 0 - 53 U/L   Total Protein 6.8 6.0 - 8.3 g/dL   Albumin 4.1 3.5 - 5.2 g/dL   Calcium 9.2 8.4 - 10.5 mg/dL  RPR     Status: None   Collection Time: 09/04/14  9:37 AM  Result Value Ref Range   RPR Ser Ql NON REAC NON REAC    Assessment/Plan: Scratch With routine healing and scabbing.  No signs of infection.  Supportive measures discussed with patient. Alarm/signs and symptoms discussed.  Follow-up PRN.

## 2014-09-27 NOTE — Progress Notes (Signed)
Pre visit review using our clinic review tool, if applicable. No additional management support is needed unless otherwise documented below in the visit note/SLS  

## 2014-10-05 ENCOUNTER — Ambulatory Visit (INDEPENDENT_AMBULATORY_CARE_PROVIDER_SITE_OTHER): Payer: Medicare Other | Admitting: Internal Medicine

## 2014-10-05 ENCOUNTER — Encounter: Payer: Self-pay | Admitting: Internal Medicine

## 2014-10-05 DIAGNOSIS — B20 Human immunodeficiency virus [HIV] disease: Secondary | ICD-10-CM

## 2014-10-05 NOTE — Progress Notes (Signed)
HPI: [name] is a 69 y.o. male who presents to the [facility] today for follow-up of his HIV infection.  Allergies: No Known Allergies  Vitals: Temp: 97.5 F (36.4 C) (03/03 1442) Temp Source: Oral (03/03 1442) BP: 144/76 mmHg (03/03 1442) Pulse Rate: 90 (03/03 1442)  Past Medical History: Past Medical History  Diagnosis Date  . Arthritis   . Diabetes mellitus   . History of depression   . GERD (gastroesophageal reflux disease)   . CAD (coronary artery disease)     2003- cabg  . Hypertension   . Hyperlipidemia   . History of kidney stones   . COPD (chronic obstructive pulmonary disease)   . HIV (human immunodeficiency virus infection) 1991    on meds since initial dx.   . Dermatitis 11/27/2012  . Nocturia 03/06/2013  . Epicondylitis 06/05/2013    right  . Pancreatitis 11/2013    attributed to HIV meds.   . Constipation 05/28/2014    Social History: History   Social History  . Marital Status: Divorced    Spouse Name: N/A  . Number of Children: 4  . Years of Education: N/A   Occupational History  . 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    Social History Main Topics  . Smoking status: Never Smoker   . Smokeless tobacco: Never Used  . Alcohol Use: No  . Drug Use: No  . Sexual Activity: Not Currently     Comment: declined condoms   Other Topics Concern  . None   Social History Narrative   Lives alone.  Supportive friends and family.  His HIV Dx is not a secret.     Previous Regimen: Combivir + Viramune then Strilbild  Current Regimen: Intelence + Isentress + Prezista + Norvir  Labs: HIV 1 RNA QUANT (copies/mL)  Date Value  09/04/2014 <20  05/24/2014 <20  01/26/2014 <20   HIV-1 RNA VIRAL LOAD (no units)  Date Value  07/14/2013 <40  04/13/2013 <40  01/18/2013 <40   CD4 (no units)  Date Value  07/14/2013 514  04/13/2013 498  01/18/2013 522   CD4 T CELL ABS (/uL)  Date Value  09/04/2014 320*  05/24/2014  300*  01/26/2014 320*   HEP B S AB (no units)  Date Value  03/24/2014 POSITIVE*   HEPATITIS B SURFACE AG (no units)  Date Value  03/24/2014 NEGATIVE   HCV AB (no units)  Date Value  09/29/2012 NEGATIVE    CrCl: CrCl cannot be calculated (Patient has no serum creatinine result on file.).  Lipids:    Component Value Date/Time   CHOL 160 06/23/2014 1156   TRIG 191.0* 06/23/2014 1156   HDL 38.00* 06/23/2014 1156   CHOLHDL 4 06/23/2014 1156   VLDL 38.2 06/23/2014 1156   LDLCALC 84 06/23/2014 1156    Assessment: 69 yo m who presented to the [facility] today for follow-up of his HIV infection.  He is currently on Intelence + Isentress + Prezista + Norvir after being admitted to the hospital for having an episode of lactic acidosis and renal insufficiency on Stribild. We will switch him today to Prezista + Norvir + Tivicay + Edurant to decrease pill burden and make his regimen easier.  Counseled him on his new HIV regimen including when to take them and how to take them (once daily with food). Also educated him that none of these new medications will affect his kidneys. Answered any questions the  patient had about him new regimen and made a medication calendar for him.  Recommendations: Prezista 800 mg once daily Tivicay 50 mg once daily Norvir 100 mg once daily Edurant 25 mg once daily  Cassie L. Nicole Kindred, PharmD [facility] 10/05/2014, 3:33 PM

## 2014-10-05 NOTE — Progress Notes (Signed)
Patient ID: [name], male   DOB: [birth year], 69 y.o.   MRN: [id number]          Patient Active Problem List   Diagnosis Date Noted  . DM (diabetes mellitus), type 2 with ophthalmic complications 75/64/3329    Priority: High  . Human immunodeficiency virus (HIV) disease 05/12/2006    Priority: High  . Coronary atherosclerosis 05/12/2006    Priority: High  . Scratch 09/27/2014  . Absolute anemia 05/28/2014  . Constipation 05/28/2014  . Pedal edema 05/21/2014  . Acute respiratory failure 05/03/2014  . Posterior cervical lymphadenopathy 03/30/2014  . AKI (acute kidney injury) 03/23/2014  . Metabolic acidosis 51/88/4166  . Hyperkalemia 03/23/2014  . Hyponatremia 03/23/2014  . Protein-calorie malnutrition, severe 03/23/2014  . Diarrhea 11/20/2013  . HLD (hyperlipidemia) 11/19/2013  . Ascites 11/18/2013  . Pancreatitis 11/15/2013  . Epicondylitis 06/05/2013  . Nocturia 03/06/2013  . Inguinal adenopathy 01/11/2013  . Dermatitis 11/27/2012  . Night sweats 07/06/2012  . Erectile dysfunction 01/01/2012  . Lipodystrophy 12/20/2010  . Dry eye syndrome 12/20/2010  . BELLS PALSY 07/19/2010  . GENITAL HERPES 05/03/2009  . SHINGLES, HX OF 05/03/2009  . WEIGHT LOSS, ABNORMAL 04/03/2009  . INGUINAL LYMPHADENOPATHY, RIGHT 04/03/2009  . MEMORY LOSS 09/18/2008  . PERIPHERAL VASCULAR DISEASE 07/20/2008  . COPD 07/20/2008  . HIP PAIN, BILATERAL 07/17/2008  . KNEE PAIN, BILATERAL 07/17/2008  . HEMATOCHEZIA 04/18/2008  . NEPHROLITHIASIS, HX OF 02/22/2008  . DEPRESSION 09/03/2006  . GERD 09/03/2006  . CORONARY ARTERY BYPASS GRAFT, HX OF 09/03/2006  . HEARING LOSS, SENSORINEURAL 05/12/2006  . Essential hypertension 05/12/2006    Patient's Medications  New Prescriptions   No medications on file  Previous Medications   AMLODIPINE (NORVASC) 10 MG TABLET    TAKE 1 TABLET BY MOUTH EVERY NIGHT AT BEDTIME   ASPIRIN 81 MG CHEWABLE TABLET    Chew 81 mg by mouth daily.   ATORVASTATIN  (LIPITOR) 10 MG TABLET    Take 10 mg by mouth daily.   B COMPLEX VITAMINS TABLET    Take 1 tablet by mouth daily.   DARUNAVIR ETHANOLATE (PREZISTA) 800 MG TABLET    Take 1 tablet (800 mg total) by mouth daily.   ETRAVIRINE 200 MG TABS    Take 1 tablet (200 mg total) by mouth 2 (two) times daily after a meal.   FREESTYLE TEST STRIPS TEST STRIP    USE TO CHECK BLOOD SUGAR ONCE A DAY   FUROSEMIDE (LASIX) 20 MG TABLET    Take 20 mg by mouth daily as needed for fluid or edema.   GLIPIZIDE (GLUCOTROL) 5 MG TABLET    Take 1 tablet (5 mg total) by mouth 2 (two) times daily before a meal.   HYDROCODONE-ACETAMINOPHEN (NORCO/VICODIN) 5-325 MG PER TABLET    1 to 2 tabs every 4 to 6 hours as needed for pain.   KRILL OIL OMEGA-3 PO    Take 1 capsule by mouth daily.   LANCETS (FREESTYLE) LANCETS    USE TO CHECK BLOOD SUGAR ONCE DAILY   METOLAZONE (ZAROXOLYN) 2.5 MG TABLET    TAKE ONE TABLET BY MOUTH EVERY DAY 20 MINUTES BEFORE LASIX   OMEPRAZOLE (PRILOSEC) 20 MG CAPSULE    Take 20 mg by mouth daily.   POTASSIUM CHLORIDE SA (K-DUR,KLOR-CON) 20 MEQ TABLET    Take 20 mEq by mouth 2 (two) times daily.   PROBIOTIC PRODUCT (PROBIOTIC DAILY PO)    Take 1 tablet by mouth daily.  RALTEGRAVIR (ISENTRESS) 400 MG TABLET    Take 1 tablet (400 mg total) by mouth 2 (two) times daily.   RITONAVIR (NORVIR) 100 MG TABS TABLET    Take 1 tablet (100 mg total) by mouth daily.   SODIUM BICARBONATE 650 MG TABLET    Take 650 mg by mouth 3 (three) times daily.   VALACYCLOVIR (VALTREX) 500 MG TABLET    TAKE 1 TABLET BY MOUTH TWICE DAILY  Modified Medications   No medications on file  Discontinued Medications   No medications on file    Subjective: Adeyemi is in for his routine HIV follow-up visit. He is feeling much better and feels like he has recovered from his 2 hospitalizations last year. He was hospitalized last April with acute pancreatitis. He was rehospitalized in August with lactic acidosis and acute renal failure. He  required transient hemodialysis but was able to stop and has had steady improvement in his renal function since that time. He was switched to a twice daily for drug antiretroviral regimen. He has some difficulty swallowing etravirine but otherwise tolerates it well other than the fact that he has to take so many pills and twice daily dosing. Review of Systems: Constitutional: negative Eyes: negative Ears, nose, mouth, throat, and face: negative Respiratory: negative Cardiovascular: negative Gastrointestinal: negative Genitourinary:negative  Past Medical History  Diagnosis Date  . Arthritis   . Diabetes mellitus   . History of depression   . GERD (gastroesophageal reflux disease)   . CAD (coronary artery disease)     2003- cabg  . Hypertension   . Hyperlipidemia   . History of kidney stones   . COPD (chronic obstructive pulmonary disease)   . HIV (human immunodeficiency virus infection) 1991    on meds since initial dx.   . Dermatitis 11/27/2012  . Nocturia 03/06/2013  . Epicondylitis 06/05/2013    right  . Pancreatitis 11/2013    attributed to HIV meds.   . Constipation 05/28/2014    History  Substance Use Topics  . Smoking status: Never Smoker   . Smokeless tobacco: Never Used  . Alcohol Use: No    Family History  Problem Relation Age of Onset  . Arthritis      mother/father/paternal grandparents  . Breast cancer Maternal Aunt     paternal aunt  . Lung cancer Maternal Aunt   . Hyperlipidemia Mother   . Hyperlipidemia Father   . Heart disease      parents/maternal grandparents/ 2 brothers  . Stroke Paternal Grandmother   . Hypertension Father     paternal grandmother/3 brothers/1 sister  . Mental retardation Sister   . Diabetes Mother     paternal grandparents/1 brother    No Known Allergies  Objective: Temp: 97.5 F (36.4 C) (03/03 1442) Temp Source: Oral (03/03 1442) BP: 144/76 mmHg (03/03 1442) Pulse Rate: 90 (03/03 1442) Body mass index is 22.81  kg/(m^2).  General: He is in good spirits as usual Neck: He has prominent bilateral occipital swelling. It is very symmetrical, firm and nontender (I have never noticed this and he states that he was unaware of it) Oral: No oropharyngeal lesions Skin: No rash Lungs: Clear Cor: Regular S1 and S2 with no murmur Abdomen: Soft and nontender Mood: Bright and appropriate  Lab Results Lab Results  Component Value Date   WBC 5.1 09/04/2014   HGB 10.9* 09/04/2014   HCT 31.5* 09/04/2014   MCV 90.3 09/04/2014   PLT 225 09/04/2014    Lab Results  Component Value Date   CREATININE 1.75* 09/04/2014   BUN 33* 09/04/2014   NA 137 09/04/2014   K 3.4* 09/04/2014   CL 95* 09/04/2014   CO2 29 09/04/2014    Lab Results  Component Value Date   ALT 14 09/04/2014   AST 11 09/04/2014   ALKPHOS 99 09/04/2014   BILITOT 0.4 09/04/2014    Lab Results  Component Value Date   CHOL 160 06/23/2014   HDL 38.00* 06/23/2014   LDLCALC 84 06/23/2014   LDLDIRECT 68 05/04/2007   TRIG 191.0* 06/23/2014   CHOLHDL 4 06/23/2014    Lab Results HIV 1 RNA QUANT (copies/mL)  Date Value  09/04/2014 <20  05/24/2014 <20  01/26/2014 <20   HIV-1 RNA VIRAL LOAD (no units)  Date Value  07/14/2013 <40  04/13/2013 <40  01/18/2013 <40   CD4 (no units)  Date Value  07/14/2013 514  04/13/2013 498  01/18/2013 522   CD4 T CELL ABS (/uL)  Date Value  09/04/2014 320*  05/24/2014 300*  01/26/2014 320*     Assessment: His HIV infection is under excellent control. His renal function has improved. I would like to keep him off of NNRTIs since they may have been contributing to his pancreatitis last year. However there are opportunities to simplify his current regimen.  His renal function continues to improve.  His pancreatitis has resolved.  I'm not sure what is causing his occipital swelling. I will monitor this to see if there any changes at the time of his next visit.  Plan: 1. Start a once daily  regimen of Edurant, Tivicay, Prezista and Norvir   Michel Bickers, MD [facility] for [place] [phone] pager   [phone] cell 10/05/2014, 3:13 PM

## 2014-10-06 MED ORDER — RILPIVIRINE HCL 25 MG PO TABS: 25.0000 mg | ORAL_TABLET | Freq: Every day | ORAL | Status: DC

## 2014-10-06 MED ORDER — DOLUTEGRAVIR SODIUM 50 MG PO TABS: 50.0000 mg | ORAL_TABLET | Freq: Every day | ORAL | Status: DC

## 2014-10-09 DIAGNOSIS — R350 Frequency of micturition: Secondary | ICD-10-CM | POA: Diagnosis not present

## 2014-10-10 ENCOUNTER — Ambulatory Visit (INDEPENDENT_AMBULATORY_CARE_PROVIDER_SITE_OTHER): Payer: Medicare Other | Admitting: Family Medicine

## 2014-10-10 ENCOUNTER — Telehealth: Payer: Self-pay | Admitting: Family Medicine

## 2014-10-10 ENCOUNTER — Encounter: Payer: Self-pay | Admitting: Family Medicine

## 2014-10-10 VITALS — BP 108/60 | HR 87 | Temp 97.8°F | Ht 63.0 in | Wt 126.4 lb

## 2014-10-10 DIAGNOSIS — E785 Hyperlipidemia, unspecified: Secondary | ICD-10-CM | POA: Diagnosis not present

## 2014-10-10 DIAGNOSIS — K5900 Constipation, unspecified: Secondary | ICD-10-CM | POA: Diagnosis not present

## 2014-10-10 DIAGNOSIS — E1139 Type 2 diabetes mellitus with other diabetic ophthalmic complication: Secondary | ICD-10-CM

## 2014-10-10 DIAGNOSIS — L989 Disorder of the skin and subcutaneous tissue, unspecified: Secondary | ICD-10-CM

## 2014-10-10 DIAGNOSIS — I10 Essential (primary) hypertension: Secondary | ICD-10-CM

## 2014-10-10 DIAGNOSIS — J069 Acute upper respiratory infection, unspecified: Secondary | ICD-10-CM | POA: Diagnosis not present

## 2014-10-10 DIAGNOSIS — K219 Gastro-esophageal reflux disease without esophagitis: Secondary | ICD-10-CM | POA: Diagnosis not present

## 2014-10-10 LAB — LDL CHOLESTEROL, DIRECT: LDL DIRECT: 54 mg/dL

## 2014-10-10 LAB — COMPREHENSIVE METABOLIC PANEL
ALK PHOS: 124 U/L — AB (ref 39–117)
ALT: 17 U/L (ref 0–53)
AST: 10 U/L (ref 0–37)
Albumin: 4.4 g/dL (ref 3.5–5.2)
BILIRUBIN TOTAL: 0.5 mg/dL (ref 0.2–1.2)
BUN: 34 mg/dL — AB (ref 6–23)
CO2: 31 mEq/L (ref 19–32)
CREATININE: 1.79 mg/dL — AB (ref 0.40–1.50)
Calcium: 10 mg/dL (ref 8.4–10.5)
Chloride: 92 mEq/L — ABNORMAL LOW (ref 96–112)
GFR: 40.53 mL/min — AB (ref >60.00)
Glucose, Bld: 503 mg/dL (ref 70–99)
POTASSIUM: 3.1 meq/L — AB (ref 3.5–5.1)
Sodium: 133 mEq/L — ABNORMAL LOW (ref 135–145)
Total Protein: 7.9 g/dL (ref 6.0–8.3)

## 2014-10-10 LAB — LIPID PANEL
CHOL/HDL RATIO: 4
Cholesterol: 160 mg/dL (ref 0–200)
HDL: 36.6 mg/dL — AB (ref >39.00)
Triglycerides: 407 mg/dL — ABNORMAL HIGH (ref 0.0–149.0)

## 2014-10-10 LAB — TSH: TSH: 1.94 u[IU]/mL (ref 0.35–4.50)

## 2014-10-10 LAB — CBC
HCT: 33.2 % — ABNORMAL LOW (ref 39.0–52.0)
Hemoglobin: 11.5 g/dL — ABNORMAL LOW (ref 13.0–17.0)
MCHC: 34.7 g/dL (ref 30.0–36.0)
MCV: 89.9 fl (ref 78.0–100.0)
Platelets: 215 10*3/uL (ref 150.0–400.0)
RBC: 3.69 Mil/uL — ABNORMAL LOW (ref 4.22–5.81)
RDW: 14.4 % (ref 11.5–15.5)
WBC: 7 10*3/uL (ref 4.0–10.5)

## 2014-10-10 LAB — HEMOGLOBIN A1C: Hgb A1c MFr Bld: 9.4 % — ABNORMAL HIGH (ref 4.6–6.5)

## 2014-10-10 MED ORDER — GLIPIZIDE 5 MG PO TABS: 5.0000 mg | ORAL_TABLET | Freq: Two times a day (BID) | ORAL | Status: DC

## 2014-10-10 MED ORDER — AMOXICILLIN 500 MG PO TABS: 500.0000 mg | ORAL_TABLET | Freq: Three times a day (TID) | ORAL | Status: DC

## 2014-10-10 MED ORDER — CLOTRIMAZOLE-BETAMETHASONE 1-0.05 % EX CREA: 1.0000 "application " | TOPICAL_CREAM | Freq: Two times a day (BID) | CUTANEOUS | Status: DC

## 2014-10-10 NOTE — Telephone Encounter (Signed)
Called the patient informed per PCP instructions to increase the Glipizide 5 mg to tid per day and to pickup and start asap antibiotic sent in today.  Also informed to call back in one week with Blood sugar numbers and at that time PCP would instruct what to do next.  Patient did request more glipiizide sent in a refill  to cover him for the increase.

## 2014-10-10 NOTE — Progress Notes (Signed)
[name]  [id number] Jan 16, 1946 10/10/2014      Progress Note-Follow Up  Subjective  Chief Complaint  Chief Complaint  Patient presents with  . Follow-up    HPI  Patient is a 69 y.o. male in today for routine medical care. Patient in today for follow up. Was struggling with a respiratory infection but has improved. No fevers, chills, or malaise. Denies polyuria or polydipsia. Denies CP/palp/SOB/HA/congestion/fevers/GI or GU c/o. Taking meds as prescribed  Past Medical History  Diagnosis Date  . Arthritis   . Diabetes mellitus   . History of depression   . GERD (gastroesophageal reflux disease)   . CAD (coronary artery disease)     2003- cabg  . Hypertension   . Hyperlipidemia   . History of kidney stones   . COPD (chronic obstructive pulmonary disease)   . HIV (human immunodeficiency virus infection) 1991    on meds since initial dx.   . Dermatitis 11/27/2012  . Nocturia 03/06/2013  . Epicondylitis 06/05/2013    right  . Pancreatitis 11/2013    attributed to HIV meds.   . Constipation 05/28/2014    Past Surgical History  Procedure Laterality Date  . Coronary artery bypass graft  05/2002  . Vasectomy      Family History  Problem Relation Age of Onset  . Arthritis      mother/father/paternal grandparents  . Breast cancer Maternal Aunt     paternal aunt  . Lung cancer Maternal Aunt   . Hyperlipidemia Mother   . Hyperlipidemia Father   . Heart disease      parents/maternal grandparents/ 2 brothers  . Stroke Paternal Grandmother   . Hypertension Father     paternal grandmother/3 brothers/1 sister  . Mental retardation Sister   . Diabetes Mother     paternal grandparents/1 brother    History   Social History  . Marital Status: Divorced    Spouse Name: N/A  . Number of Children: 4  . Years of Education: N/A   Occupational History  . 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    Social History Main Topics  .  Smoking status: Never Smoker   . Smokeless tobacco: Never Used  . Alcohol Use: No  . Drug Use: No  . Sexual Activity: Not Currently     Comment: declined condoms   Other Topics Concern  . Not on file   Social History Narrative   Lives alone.  Supportive friends and family.  His HIV Dx is not a secret.     Current Outpatient Prescriptions on File Prior to Visit  Medication Sig Dispense Refill  . amLODipine (NORVASC) 10 MG tablet TAKE 1 TABLET BY MOUTH EVERY NIGHT AT BEDTIME 90 tablet 1  . aspirin 81 MG chewable tablet Chew 81 mg by mouth daily.    Marland Kitchen atorvastatin (LIPITOR) 10 MG tablet Take 10 mg by mouth daily.    Marland Kitchen b complex vitamins tablet Take 1 tablet by mouth daily.    . Darunavir Ethanolate (PREZISTA) 800 MG tablet Take 1 tablet (800 mg total) by mouth daily. 30 tablet 5  . dolutegravir (TIVICAY) 50 MG tablet Take 1 tablet (50 mg total) by mouth daily. (Patient not taking: Reported on 10/10/2014) 30 tablet 11  . FREESTYLE TEST STRIPS test strip USE TO CHECK BLOOD SUGAR ONCE A DAY 100 each 3  . furosemide (LASIX) 20 MG tablet Take 20 mg by mouth daily as  needed for fluid or edema.    Marland Kitchen glipiZIDE (GLUCOTROL) 5 MG tablet Take 1 tablet (5 mg total) by mouth 2 (two) times daily before a meal. 60 tablet 5  . HYDROcodone-acetaminophen (NORCO/VICODIN) 5-325 MG per tablet 1 to 2 tabs every 4 to 6 hours as needed for pain. (Patient not taking: Reported on 10/10/2014) 20 tablet 0  . KRILL OIL OMEGA-3 PO Take 1 capsule by mouth daily.    . Lancets (FREESTYLE) lancets USE TO CHECK BLOOD SUGAR ONCE DAILY 100 each 3  . metolazone (ZAROXOLYN) 2.5 MG tablet TAKE ONE TABLET BY MOUTH EVERY DAY 20 MINUTES BEFORE LASIX 30 tablet 2  . omeprazole (PRILOSEC) 20 MG capsule Take 20 mg by mouth daily.    . potassium chloride SA (K-DUR,KLOR-CON) 20 MEQ tablet Take 20 mEq by mouth 2 (two) times daily.    . Probiotic Product (PROBIOTIC DAILY PO) Take 1 tablet by mouth daily.     . rilpivirine (EDURANT) 25 MG TABS  tablet Take 1 tablet (25 mg total) by mouth daily with breakfast. (Patient not taking: Reported on 10/10/2014) 30 tablet 11  . ritonavir (NORVIR) 100 MG TABS tablet Take 1 tablet (100 mg total) by mouth daily. 30 tablet 5  . sodium bicarbonate 650 MG tablet Take 650 mg by mouth 3 (three) times daily.    . valACYclovir (VALTREX) 500 MG tablet TAKE 1 TABLET BY MOUTH TWICE DAILY (Patient taking differently: TAKE 1 TABLET BY MOUTH  DAILY) 60 tablet 5   No current facility-administered medications on file prior to visit.    No Known Allergies  Review of Systems  Review of Systems  Constitutional: Negative for fever and malaise/fatigue.  HENT: Positive for congestion.   Eyes: Negative for discharge.  Respiratory: Positive for cough. Negative for shortness of breath.   Cardiovascular: Negative for chest pain, palpitations and leg swelling.  Gastrointestinal: Negative for nausea, abdominal pain and diarrhea.  Genitourinary: Negative for dysuria.  Musculoskeletal: Negative for falls.  Skin: Negative for rash.  Neurological: Negative for loss of consciousness and headaches.  Endo/Heme/Allergies: Negative for polydipsia.  Psychiatric/Behavioral: Negative for depression and suicidal ideas. The patient is not nervous/anxious and does not have insomnia.     Objective  BP 108/60 mmHg  Pulse 87  Temp(Src) 97.8 F (36.6 C) (Oral)  Ht 5\' 3"  (1.6 m)  Wt 126 lb 6 oz (57.323 kg)  BMI 22.39 kg/m2  SpO2 98%  Physical Exam  Physical Exam  Constitutional: He is oriented to person, place, and time and well-developed, well-nourished, and in no distress. No distress.  HENT:  Head: Normocephalic and atraumatic.  Eyes: Conjunctivae are normal.  Neck: Neck supple. No thyromegaly present.  Cardiovascular: Normal rate and regular rhythm.   Murmur heard. Pulmonary/Chest: Effort normal and breath sounds normal. No respiratory distress.  Abdominal: He exhibits no distension and no mass. There is no  tenderness.  Musculoskeletal: He exhibits no edema.  Neurological: He is alert and oriented to person, place, and time.  Skin: Skin is warm.  Psychiatric: Memory, affect and judgment normal.    Lab Results  Component Value Date   TSH 1.94 06/23/2014   Lab Results  Component Value Date   WBC 5.1 09/04/2014   HGB 10.9* 09/04/2014   HCT 31.5* 09/04/2014   MCV 90.3 09/04/2014   PLT 225 09/04/2014   Lab Results  Component Value Date   CREATININE 1.75* 09/04/2014   BUN 33* 09/04/2014   NA 137 09/04/2014   K 3.4*  09/04/2014   CL 95* 09/04/2014   CO2 29 09/04/2014   Lab Results  Component Value Date   ALT 14 09/04/2014   AST 11 09/04/2014   ALKPHOS 99 09/04/2014   BILITOT 0.4 09/04/2014   Lab Results  Component Value Date   CHOL 160 06/23/2014   Lab Results  Component Value Date   HDL 38.00* 06/23/2014   Lab Results  Component Value Date   LDLCALC 84 06/23/2014   Lab Results  Component Value Date   TRIG 191.0* 06/23/2014   Lab Results  Component Value Date   CHOLHDL 4 06/23/2014     Assessment & Plan  Essential hypertension Well controlled, no changes to meds. Encouraged heart healthy diet such as the DASH diet and exercise as tolerated.    GERD Avoid offending foods, start probiotics. Do not eat large meals in late evening and consider raising head of bed.    DM (diabetes mellitus), type 2 with ophthalmic complications Numbers improving, encouraged to minimize simple carbs and increase proteins. Glipizide 5 mg bid   COPD Symptomatically improved.   Constipation Encouraged increased hydration and fiber in diet. Daily probiotics. If bowels not moving can use MOM 2 tbls po in 4 oz of warm prune juice by mouth every 2-3 days.    Skin lesion Patient is interested in a certain dermatologist, he is going to call back for the referral

## 2014-10-10 NOTE — Patient Instructions (Addendum)
Call with name of dermatologist so we can make referral   Basic Carbohydrate Counting for Diabetes Mellitus Carbohydrate counting is a method for keeping track of the amount of carbohydrates you eat. Eating carbohydrates naturally increases the level of sugar (glucose) in your blood, so it is important for you to know the amount that is okay for you to have in every meal. Carbohydrate counting helps keep the level of glucose in your blood within normal limits. The amount of carbohydrates allowed is different for every person. A dietitian can help you calculate the amount that is right for you. Once you know the amount of carbohydrates you can have, you can count the carbohydrates in the foods you want to eat. Carbohydrates are found in the following foods:  Grains, such as breads and cereals.  Dried beans and soy products.  Starchy vegetables, such as potatoes, peas, and corn.  Fruit and fruit juices.  Milk and yogurt.  Sweets and snack foods, such as cake, cookies, candy, chips, soft drinks, and fruit drinks. CARBOHYDRATE COUNTING There are two ways to count the carbohydrates in your food. You can use either of the methods or a combination of both. Reading the "Nutrition Facts" on [place] The "Nutrition Facts" is an area that is included on the labels of almost all packaged food and beverages in the [place]. It includes the serving size of that food or beverage and information about the nutrients in each serving of the food, including the grams (g) of carbohydrate per serving.  Decide the number of servings of this food or beverage that you will be able to eat or drink. Multiply that number of servings by the number of grams of carbohydrate that is listed on the label for that serving. The total will be the amount of carbohydrates you will be having when you eat or drink this food or beverage. Learning Standard Serving Sizes of Food When you eat food that is not packaged or does  not include "Nutrition Facts" on the label, you need to measure the servings in order to count the amount of carbohydrates.A serving of most carbohydrate-rich foods contains about 15 g of carbohydrates. The following list includes serving sizes of carbohydrate-rich foods that provide 15 g ofcarbohydrate per serving:   1 slice of bread (1 oz) or 1 six-inch tortilla.    of a hamburger bun or English muffin.  4-6 crackers.   cup unsweetened dry cereal.    cup hot cereal.   cup rice or pasta.    cup mashed potatoes or  of a large baked potato.  1 cup fresh fruit or one small piece of fruit.    cup canned or frozen fruit or fruit juice.  1 cup milk.   cup plain fat-free yogurt or yogurt sweetened with artificial sweeteners.   cup cooked dried beans or starchy vegetable, such as peas, corn, or potatoes.  Decide the number of standard-size servings that you will eat. Multiply that number of servings by 15 (the grams of carbohydrates in that serving). For example, if you eat 2 cups of strawberries, you will have eaten 2 servings and 30 g of carbohydrates (2 servings x 15 g = 30 g). For foods such as soups and casseroles, in which more than one food is mixed in, you will need to count the carbohydrates in each food that is included. EXAMPLE OF CARBOHYDRATE COUNTING Sample Dinner  3 oz chicken breast.   cup of brown rice.   cup  of corn.  1 cup milk.   1 cup strawberries with sugar-free whipped topping.  Carbohydrate Calculation Step 1: Identify the foods that contain carbohydrates:   Rice.   Corn.   Milk.   Strawberries. Step 2:Calculate the number of servings eaten of each:   2 servings of rice.   1 serving of corn.   1 serving of milk.   1 serving of strawberries. Step 3: Multiply each of those number of servings by 15 g:   2 servings of rice x 15 g = 30 g.   1 serving of corn x 15 g = 15 g.   1 serving of milk x 15 g = 15 g.   1  serving of strawberries x 15 g = 15 g. Step 4: Add together all of the amounts to find the total grams of carbohydrates eaten: 30 g + 15 g + 15 g + 15 g = 75 g. Document Released: 07/21/2005 Document Revised: 12/05/2013 Document Reviewed: 06/17/2013 [facility] Patient Information 2015 [place], [place]. This information is not intended to replace advice given to you by your health care provider. Make sure you discuss any questions you have with your health care provider.

## 2014-10-10 NOTE — Progress Notes (Signed)
Pre visit review using our clinic review tool, if applicable. No additional management support is needed unless otherwise documented below in the visit note. 

## 2014-10-10 NOTE — Telephone Encounter (Signed)
Called the patient informed of his results and PCP instructions to start insulin.  The patient has not been on insulin before and at this time does not want to start. He believes the increase is due to the new HIV medication.  The patient stated he is ok to make an increase/change in his diabetes oral medications.  He did say as a very last result he may try insulin, but wants to wait at this time.

## 2014-10-10 NOTE — Telephone Encounter (Signed)
Caller name: [place] Relation to pt: self Call back number: [phone] Pharmacy:  Reason for call:   Patient states that he was supposed to call in with the name of the dermatologist that he saw. [facility], Dr. Murvin Donning

## 2014-10-10 NOTE — Telephone Encounter (Signed)
Called the patient left message to call back 

## 2014-10-10 NOTE — Telephone Encounter (Signed)
Critical Lab Results Glucose 503

## 2014-10-14 ENCOUNTER — Emergency Department
Admission: EM | Admit: 2014-10-14 | Discharge: 2014-10-14 | Discharge status: Absent without leave | Payer: Medicare Other | Source: Home / Self Care

## 2014-10-14 ENCOUNTER — Encounter (HOSPITAL_BASED_OUTPATIENT_CLINIC_OR_DEPARTMENT_OTHER): Payer: Self-pay

## 2014-10-14 ENCOUNTER — Emergency Department (HOSPITAL_BASED_OUTPATIENT_CLINIC_OR_DEPARTMENT_OTHER)
Admission: EM | Admit: 2014-10-14 | Discharge: 2014-10-14 | Disposition: A | Discharge status: Home / Self Care | Payer: Medicare Other | Attending: Emergency Medicine | Admitting: Emergency Medicine

## 2014-10-14 DIAGNOSIS — I2510 Atherosclerotic heart disease of native coronary artery without angina pectoris: Secondary | ICD-10-CM | POA: Insufficient documentation

## 2014-10-14 DIAGNOSIS — S00511A Abrasion of lip, initial encounter: Secondary | ICD-10-CM | POA: Diagnosis not present

## 2014-10-14 DIAGNOSIS — Z951 Presence of aortocoronary bypass graft: Secondary | ICD-10-CM | POA: Diagnosis not present

## 2014-10-14 DIAGNOSIS — Z21 Asymptomatic human immunodeficiency virus [HIV] infection status: Secondary | ICD-10-CM | POA: Insufficient documentation

## 2014-10-14 DIAGNOSIS — Z7982 Long term (current) use of aspirin: Secondary | ICD-10-CM | POA: Insufficient documentation

## 2014-10-14 DIAGNOSIS — S60222A Contusion of left hand, initial encounter: Secondary | ICD-10-CM | POA: Diagnosis not present

## 2014-10-14 DIAGNOSIS — J449 Chronic obstructive pulmonary disease, unspecified: Secondary | ICD-10-CM | POA: Insufficient documentation

## 2014-10-14 DIAGNOSIS — Z79899 Other long term (current) drug therapy: Secondary | ICD-10-CM | POA: Diagnosis not present

## 2014-10-14 DIAGNOSIS — S8002XA Contusion of left knee, initial encounter: Secondary | ICD-10-CM

## 2014-10-14 DIAGNOSIS — E785 Hyperlipidemia, unspecified: Secondary | ICD-10-CM | POA: Diagnosis not present

## 2014-10-14 DIAGNOSIS — Y9289 Other specified places as the place of occurrence of the external cause: Secondary | ICD-10-CM | POA: Diagnosis not present

## 2014-10-14 DIAGNOSIS — Y998 Other external cause status: Secondary | ICD-10-CM | POA: Diagnosis not present

## 2014-10-14 DIAGNOSIS — E119 Type 2 diabetes mellitus without complications: Secondary | ICD-10-CM | POA: Diagnosis not present

## 2014-10-14 DIAGNOSIS — Z87442 Personal history of urinary calculi: Secondary | ICD-10-CM | POA: Insufficient documentation

## 2014-10-14 DIAGNOSIS — M1990 Unspecified osteoarthritis, unspecified site: Secondary | ICD-10-CM | POA: Insufficient documentation

## 2014-10-14 DIAGNOSIS — Y9389 Activity, other specified: Secondary | ICD-10-CM | POA: Diagnosis not present

## 2014-10-14 DIAGNOSIS — K219 Gastro-esophageal reflux disease without esophagitis: Secondary | ICD-10-CM | POA: Insufficient documentation

## 2014-10-14 DIAGNOSIS — I10 Essential (primary) hypertension: Secondary | ICD-10-CM | POA: Insufficient documentation

## 2014-10-14 DIAGNOSIS — S0993XA Unspecified injury of face, initial encounter: Secondary | ICD-10-CM | POA: Diagnosis present

## 2014-10-14 DIAGNOSIS — S0033XA Contusion of nose, initial encounter: Secondary | ICD-10-CM
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
## 2014-10-14 NOTE — Discharge Instructions (Signed)
Abrasion An abrasion is a cut or scrape of the skin. Abrasions do not extend through all layers of the skin and most heal within 10 days. It is important to care for your abrasion properly to prevent infection. CAUSES  Most abrasions are caused by falling on, or gliding across, the ground or other surface. When your skin rubs on something, the outer and inner layer of skin rubs off, causing an abrasion. DIAGNOSIS  Your caregiver will be able to diagnose an abrasion during a physical exam.  TREATMENT  Your treatment depends on how large and deep the abrasion is. Generally, your abrasion will be cleaned with water and a mild soap to remove any dirt or debris. An antibiotic ointment may be put over the abrasion to prevent an infection. A bandage (dressing) may be wrapped around the abrasion to keep it from getting dirty.  You may need a tetanus shot if:  You cannot remember when you had your last tetanus shot.  You have never had a tetanus shot.  The injury broke your skin. If you get a tetanus shot, your arm may swell, get red, and feel warm to the touch. This is common and not a problem. If you need a tetanus shot and you choose not to have one, there is a rare chance of getting tetanus. Sickness from tetanus can be serious.  HOME CARE INSTRUCTIONS   If a dressing was applied, change it at least once a day or as directed by your caregiver. If the bandage sticks, soak it off with warm water.   Wash the area with water and a mild soap to remove all the ointment 2 times a day. Rinse off the soap and pat the area dry with a clean towel.   Reapply any ointment as directed by your caregiver. This will help prevent infection and keep the bandage from sticking. Use gauze over the wound and under the dressing to help keep the bandage from sticking.   Change your dressing right away if it becomes wet or dirty.   Only take over-the-counter or prescription medicines for pain, discomfort, or fever as  directed by your caregiver.   Follow up with your caregiver within 24-48 hours for a wound check, or as directed. If you were not given a wound-check appointment, look closely at your abrasion for redness, swelling, or pus. These are signs of infection. SEEK IMMEDIATE MEDICAL CARE IF:   You have increasing pain in the wound.   You have redness, swelling, or tenderness around the wound.   You have pus coming from the wound.   You have a fever or persistent symptoms for more than 2-3 days.  You have a fever and your symptoms suddenly get worse.  You have a bad smell coming from the wound or dressing.  MAKE SURE YOU:   Understand these instructions.  Will watch your condition.  Will get help right away if you are not doing well or get worse. Document Released: 04/30/2005 Document Revised: 07/07/2012 Document Reviewed: 06/24/2011 [facility] Patient Information 2015 [place], [place]. This information is not intended to replace advice given to you by your health care provider. Make sure you discuss any questions you have with your health care provider. Assault, General Assault includes any behavior, whether intentional or reckless, which results in bodily injury to another person and/or damage to property. Included in this would be any behavior, intentional or reckless, that by its nature would be understood (interpreted) by a reasonable person as intent  to harm another person or to damage his/her property. Threats may be oral or written. They may be communicated through regular mail, computer, fax, or phone. These threats may be direct or implied. FORMS OF ASSAULT INCLUDE: Physically assaulting a person. This includes physical threats to inflict physical harm as well as: Slapping. Hitting. Poking. Kicking. Punching. Pushing. Arson. Sabotage. Equipment vandalism. Damaging or destroying property. Throwing or hitting objects. Displaying a weapon or an object that appears to be a  weapon in a threatening manner. Carrying a firearm of any kind. Using a weapon to harm someone. Using greater physical size/strength to intimidate another. Making intimidating or threatening gestures. Bullying. Hazing. Intimidating, threatening, hostile, or abusive language directed toward another person. It communicates the intention to engage in violence against that person. And it leads a reasonable person to expect that violent behavior may occur. Stalking another person. IF IT HAPPENS AGAIN: Immediately call for emergency help (911 in [place] If someone poses clear and immediate danger to you, seek legal authorities to have a protective or restraining order put in place. Less threatening assaults can at least be reported to authorities. STEPS TO TAKE IF A SEXUAL ASSAULT HAS HAPPENED Go to an area of safety. This may include a shelter or staying with a friend. Stay away from the area where you have been attacked. A large percentage of sexual assaults are caused by a friend, relative or associate. If medications were given by your caregiver, take them as directed for the full length of time prescribed. Only take over-the-counter or prescription medicines for pain, discomfort, or fever as directed by your caregiver. If you have come in contact with a sexual disease, find out if you are to be tested again. If your caregiver is concerned about the HIV/AIDS virus, he/she may require you to have continued testing for several months. For the protection of your privacy, test results can not be given over the phone. Make sure you receive the results of your test. If your test results are not back during your visit, make an appointment with your caregiver to find out the results. Do not assume everything is normal if you have not heard from your caregiver or the medical facility. It is important for you to follow up on all of your test results. File appropriate papers with authorities. This is important  in all assaults, even if it has occurred in a family or by a friend. SEEK MEDICAL CARE IF: You have new problems because of your injuries. You have problems that may be because of the medicine you are taking, such as: Rash. Itching. Swelling. Trouble breathing. You develop belly (abdominal) pain, feel sick to your stomach (nausea) or are vomiting. You begin to run a temperature. You need supportive care or referral to a rape crisis center. These are centers with trained personnel who can help you get through this ordeal. SEEK IMMEDIATE MEDICAL CARE IF: You are afraid of being threatened, beaten, or abused. In [place] call 911. You receive new injuries related to abuse. You develop severe pain in any area injured in the assault or have any change in your condition that concerns you. You faint or lose consciousness. You develop chest pain or shortness of breath. Document Released: 07/21/2005 Document Revised: 10/13/2011 Document Reviewed: 03/08/2008 [facility] Patient Information 2015 [place], [place]. This information is not intended to replace advice given to you by your health care provider. Make sure you discuss any questions you have with your health care provider. Contusion A contusion  is a deep bruise. Contusions are the result of an injury that caused bleeding under the skin. The contusion may turn blue, purple, or yellow. Minor injuries will give you a painless contusion, but more severe contusions may stay painful and swollen for a few weeks.  CAUSES  A contusion is usually caused by a blow, trauma, or direct force to an area of the body. SYMPTOMS   Swelling and redness of the injured area.  Bruising of the injured area.  Tenderness and soreness of the injured area.  Pain. DIAGNOSIS  The diagnosis can be made by taking a history and physical exam. An X-ray, CT scan, or MRI may be needed to determine if there were any associated injuries, such as fractures. TREATMENT  Specific  treatment will depend on what area of the body was injured. In general, the best treatment for a contusion is resting, icing, elevating, and applying cold compresses to the injured area. Over-the-counter medicines may also be recommended for pain control. Ask your caregiver what the best treatment is for your contusion. HOME CARE INSTRUCTIONS   Put ice on the injured area.  Put ice in a plastic bag.  Place a towel between your skin and the bag.  Leave the ice on for 15-20 minutes, 3-4 times a day, or as directed by your health care provider.  Only take over-the-counter or prescription medicines for pain, discomfort, or fever as directed by your caregiver. Your caregiver may recommend avoiding anti-inflammatory medicines (aspirin, ibuprofen, and naproxen) for 48 hours because these medicines may increase bruising.  Rest the injured area.  If possible, elevate the injured area to reduce swelling. SEEK IMMEDIATE MEDICAL CARE IF:   You have increased bruising or swelling.  You have pain that is getting worse.  Your swelling or pain is not relieved with medicines. MAKE SURE YOU:   Understand these instructions.  Will watch your condition.  Will get help right away if you are not doing well or get worse. Document Released: 04/30/2005 Document Revised: 07/26/2013 Document Reviewed: 05/26/2011 [facility] Patient Information 2015 [place], [place]. This information is not intended to replace advice given to you by your health care provider. Make sure you discuss any questions you have with your health care provider.
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
## 2014-10-17 ENCOUNTER — Encounter: Payer: Self-pay | Admitting: Family Medicine

## 2014-10-18 ENCOUNTER — Encounter: Payer: Self-pay | Admitting: Family Medicine

## 2014-10-18 ENCOUNTER — Other Ambulatory Visit: Payer: Self-pay | Admitting: Internal Medicine

## 2014-10-18 DIAGNOSIS — R3912 Poor urinary stream: Secondary | ICD-10-CM | POA: Diagnosis not present

## 2014-10-18 DIAGNOSIS — R278 Other lack of coordination: Secondary | ICD-10-CM | POA: Diagnosis not present

## 2014-10-18 DIAGNOSIS — M62838 Other muscle spasm: Secondary | ICD-10-CM | POA: Diagnosis not present

## 2014-10-18 DIAGNOSIS — L989 Disorder of the skin and subcutaneous tissue, unspecified: Secondary | ICD-10-CM | POA: Insufficient documentation

## 2014-10-18 DIAGNOSIS — M6281 Muscle weakness (generalized): Secondary | ICD-10-CM | POA: Diagnosis not present

## 2014-10-18 NOTE — Assessment & Plan Note (Signed)
Avoid offending foods, start probiotics. Do not eat large meals in late evening and consider raising head of bed.  

## 2014-10-18 NOTE — Assessment & Plan Note (Signed)
Numbers improving, encouraged to minimize simple carbs and increase proteins. Glipizide 5 mg bid

## 2014-10-18 NOTE — Assessment & Plan Note (Signed)
Encouraged increased hydration and fiber in diet. Daily probiotics. If bowels not moving can use MOM 2 tbls po in 4 oz of warm prune juice by mouth every 2-3 days.  

## 2014-10-18 NOTE — Assessment & Plan Note (Signed)
Symptomatically improved 

## 2014-10-18 NOTE — Assessment & Plan Note (Signed)
Patient is interested in a certain dermatologist, he is going to call back for the referral

## 2014-10-18 NOTE — Assessment & Plan Note (Signed)
Well controlled, no changes to meds. Encouraged heart healthy diet such as the DASH diet and exercise as tolerated.  °

## 2014-10-21 ENCOUNTER — Other Ambulatory Visit: Payer: Self-pay | Admitting: Internal Medicine

## 2014-10-23 DIAGNOSIS — N2581 Secondary hyperparathyroidism of renal origin: Secondary | ICD-10-CM | POA: Diagnosis not present

## 2014-10-23 DIAGNOSIS — N183 Chronic kidney disease, stage 3 (moderate): Secondary | ICD-10-CM | POA: Diagnosis not present

## 2014-10-23 DIAGNOSIS — N189 Chronic kidney disease, unspecified: Secondary | ICD-10-CM | POA: Diagnosis not present

## 2014-10-26 DIAGNOSIS — I129 Hypertensive chronic kidney disease with stage 1 through stage 4 chronic kidney disease, or unspecified chronic kidney disease: Secondary | ICD-10-CM | POA: Diagnosis not present

## 2014-10-26 DIAGNOSIS — N183 Chronic kidney disease, stage 3 (moderate): Secondary | ICD-10-CM | POA: Diagnosis not present

## 2014-10-26 DIAGNOSIS — N2581 Secondary hyperparathyroidism of renal origin: Secondary | ICD-10-CM | POA: Diagnosis not present

## 2014-10-26 DIAGNOSIS — D631 Anemia in chronic kidney disease: Secondary | ICD-10-CM | POA: Diagnosis not present

## 2014-11-02 ENCOUNTER — Telehealth: Payer: Self-pay | Admitting: Family Medicine

## 2014-11-02 MED ORDER — GLUCOSE BLOOD VI STRP: ORAL_STRIP | Status: DC

## 2014-11-02 NOTE — Telephone Encounter (Signed)
Called the patient confirmed he is testing twice daily per PCP instructions.  Sent in new prescription to [facility] [address].

## 2014-11-02 NOTE — Telephone Encounter (Signed)
Caller name: Daryn Relation to pt: self Call back number: [phone] Pharmacy: [facility] [address]  Reason for call:   Patient states that test strips and lantus were originally prescribed as once a day but now test sugar levels twice daily. Patient went to pharmacy to get refill and they told him it was too soon for refill on both.

## 2014-11-03 ENCOUNTER — Telehealth: Payer: Self-pay | Admitting: *Deleted

## 2014-11-03 NOTE — Telephone Encounter (Signed)
Prior authorization for freestyle lite blood glucose test strips initiated through Medicare. Awaiting determination. JG//CMA

## 2014-11-06 ENCOUNTER — Encounter: Payer: Self-pay | Admitting: Family Medicine

## 2014-11-08 ENCOUNTER — Encounter: Payer: Self-pay | Admitting: Family Medicine

## 2014-11-09 ENCOUNTER — Telehealth: Payer: Self-pay

## 2014-11-09 MED ORDER — GLUCOSE BLOOD VI STRP: ORAL_STRIP | Status: DC

## 2014-11-09 MED ORDER — GLIPIZIDE 5 MG PO TABS: 5.0000 mg | ORAL_TABLET | Freq: Three times a day (TID) | ORAL | Status: DC

## 2014-11-09 NOTE — Telephone Encounter (Signed)
Glipizide changed to tid before meals per Dr. Charlett Blake, new Rx sent to [place] along with test strips.

## 2014-11-13 MED ORDER — FREESTYLE LANCETS MISC
1.0000 | Freq: Three times a day (TID) | Status: DC
Start: 2014-11-13 — End: 2014-11-28

## 2014-11-13 NOTE — Telephone Encounter (Signed)
Lancets and test strips were sent 11/09/14.  Called patient to verify that he had these products and he stated that he was going to pick them up from pharmacy today and would call if he had any problems.  Signature changed to reflect TID.

## 2014-11-20 ENCOUNTER — Other Ambulatory Visit: Payer: Self-pay | Admitting: Internal Medicine

## 2014-11-26 ENCOUNTER — Encounter: Payer: Self-pay | Admitting: Family Medicine

## 2014-11-28 ENCOUNTER — Other Ambulatory Visit: Payer: Self-pay | Admitting: Family Medicine

## 2014-11-28 MED ORDER — GLUCOSE BLOOD VI STRP: ORAL_STRIP | Status: DC

## 2014-11-28 MED ORDER — FREESTYLE LANCETS MISC: Status: DC

## 2014-11-28 NOTE — Telephone Encounter (Signed)
Sent in strips and lancets per mychart request.

## 2014-12-13 ENCOUNTER — Other Ambulatory Visit: Payer: Self-pay | Admitting: Internal Medicine

## 2014-12-13 ENCOUNTER — Other Ambulatory Visit: Payer: Self-pay | Admitting: Infectious Diseases

## 2014-12-13 ENCOUNTER — Other Ambulatory Visit: Payer: Self-pay | Admitting: Family Medicine

## 2014-12-13 DIAGNOSIS — K219 Gastro-esophageal reflux disease without esophagitis: Secondary | ICD-10-CM

## 2014-12-13 MED ORDER — OMEPRAZOLE 20 MG PO CPDR: 20.0000 mg | DELAYED_RELEASE_CAPSULE | Freq: Every day | ORAL | Status: DC

## 2015-01-10 ENCOUNTER — Encounter: Payer: Self-pay | Admitting: Internal Medicine

## 2015-01-15 ENCOUNTER — Other Ambulatory Visit: Payer: Self-pay | Admitting: Family Medicine

## 2015-01-19 ENCOUNTER — Encounter: Payer: Self-pay | Admitting: Family Medicine

## 2015-01-19 ENCOUNTER — Other Ambulatory Visit: Payer: Self-pay | Admitting: Family Medicine

## 2015-01-19 ENCOUNTER — Ambulatory Visit (INDEPENDENT_AMBULATORY_CARE_PROVIDER_SITE_OTHER): Payer: Medicare Other | Admitting: Family Medicine

## 2015-01-19 VITALS — BP 122/62 | HR 75 | Temp 97.8°F | Ht 63.0 in | Wt 128.5 lb

## 2015-01-19 DIAGNOSIS — I10 Essential (primary) hypertension: Secondary | ICD-10-CM | POA: Diagnosis not present

## 2015-01-19 DIAGNOSIS — K219 Gastro-esophageal reflux disease without esophagitis: Secondary | ICD-10-CM

## 2015-01-19 DIAGNOSIS — M25579 Pain in unspecified ankle and joints of unspecified foot: Secondary | ICD-10-CM

## 2015-01-19 DIAGNOSIS — R351 Nocturia: Secondary | ICD-10-CM

## 2015-01-19 DIAGNOSIS — E1139 Type 2 diabetes mellitus with other diabetic ophthalmic complication: Secondary | ICD-10-CM

## 2015-01-19 DIAGNOSIS — E876 Hypokalemia: Secondary | ICD-10-CM

## 2015-01-19 DIAGNOSIS — E871 Hypo-osmolality and hyponatremia: Secondary | ICD-10-CM

## 2015-01-19 DIAGNOSIS — R413 Other amnesia: Secondary | ICD-10-CM

## 2015-01-19 HISTORY — DX: Pain in unspecified ankle and joints of unspecified foot: M25.579

## 2015-01-19 LAB — URINALYSIS
BILIRUBIN URINE: NEGATIVE
Ketones, ur: NEGATIVE
Leukocytes, UA: NEGATIVE
Nitrite: NEGATIVE
Specific Gravity, Urine: 1.01 (ref 1.000–1.030)
Total Protein, Urine: NEGATIVE
Urine Glucose: >=1000 — AB
Urobilinogen, UA: 0.2 (ref 0.0–1.0)
pH: 6.5 (ref 5.0–8.0)

## 2015-01-19 LAB — COMPREHENSIVE METABOLIC PANEL
ALK PHOS: 106 U/L (ref 39–117)
ALT: 19 U/L (ref 0–53)
AST: 15 U/L (ref 0–37)
Albumin: 4.4 g/dL (ref 3.5–5.2)
BILIRUBIN TOTAL: 0.6 mg/dL (ref 0.2–1.2)
BUN: 34 mg/dL — ABNORMAL HIGH (ref 6–23)
CALCIUM: 10.1 mg/dL (ref 8.4–10.5)
CO2: 29 meq/L (ref 19–32)
Chloride: 93 mEq/L — ABNORMAL LOW (ref 96–112)
Creatinine, Ser: 1.76 mg/dL — ABNORMAL HIGH (ref 0.40–1.50)
GFR: 41.3 mL/min — ABNORMAL LOW (ref >60.00)
Glucose, Bld: 431 mg/dL — ABNORMAL HIGH (ref 70–99)
Potassium: 3.1 mEq/L — ABNORMAL LOW (ref 3.5–5.1)
SODIUM: 133 meq/L — AB (ref 135–145)
Total Protein: 7.6 g/dL (ref 6.0–8.3)

## 2015-01-19 LAB — TSH: TSH: 1.82 u[IU]/mL (ref 0.35–4.50)

## 2015-01-19 LAB — LIPID PANEL
CHOLESTEROL: 157 mg/dL (ref 0–200)
HDL: 33.4 mg/dL — AB (ref >39.00)
NonHDL: 123.6
TRIGLYCERIDES: 269 mg/dL — AB (ref 0.0–149.0)
Total CHOL/HDL Ratio: 5
VLDL: 53.8 mg/dL — AB (ref 0.0–40.0)

## 2015-01-19 LAB — CBC
HEMATOCRIT: 36.1 % — AB (ref 39.0–52.0)
Hemoglobin: 12.3 g/dL — ABNORMAL LOW (ref 13.0–17.0)
MCHC: 33.9 g/dL (ref 30.0–36.0)
MCV: 90.3 fl (ref 78.0–100.0)
Platelets: 214 10*3/uL (ref 150.0–400.0)
RBC: 4 Mil/uL — AB (ref 4.22–5.81)
RDW: 14.4 % (ref 11.5–15.5)
WBC: 5.6 10*3/uL (ref 4.0–10.5)

## 2015-01-19 LAB — HEMOGLOBIN A1C: Hgb A1c MFr Bld: 9.5 % — ABNORMAL HIGH (ref 4.6–6.5)

## 2015-01-19 LAB — LDL CHOLESTEROL, DIRECT: LDL DIRECT: 80 mg/dL

## 2015-01-19 MED ORDER — POTASSIUM CHLORIDE CRYS ER 20 MEQ PO TBCR: 20.0000 meq | EXTENDED_RELEASE_TABLET | Freq: Two times a day (BID) | ORAL | Status: DC
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
## 2015-01-19 NOTE — Assessment & Plan Note (Signed)
Tolerating statin, encouraged heart healthy diet, avoid trans fats, minimize simple carbs and saturated fats. Increase exercise as tolerated 

## 2015-01-19 NOTE — Assessment & Plan Note (Signed)
hgba1c unacceptable, minimize simple carbs. Increase exercise as tolerated. Continue current meds for now and refer to endocrinology for further consideration. Will repeat Hgba1c

## 2015-01-19 NOTE — Progress Notes (Signed)
Pre visit review using our clinic review tool, if applicable. No additional management support is needed unless otherwise documented below in the visit note. 

## 2015-01-19 NOTE — Patient Instructions (Addendum)
Can hold [employer] and Furosemide Minimize sodium Drop Prilosec to every other day if no flare then drop to every 3 rd day. If no symptoms then use only as needed   Basic Carbohydrate Counting for Diabetes Mellitus Carbohydrate counting is a method for keeping track of the amount of carbohydrates you eat. Eating carbohydrates naturally increases the level of sugar (glucose) in your blood, so it is important for you to know the amount that is okay for you to have in every meal. Carbohydrate counting helps keep the level of glucose in your blood within normal limits. The amount of carbohydrates allowed is different for every person. A dietitian can help you calculate the amount that is right for you. Once you know the amount of carbohydrates you can have, you can count the carbohydrates in the foods you want to eat. Carbohydrates are found in the following foods:  Grains, such as breads and cereals.  Dried beans and soy products.  Starchy vegetables, such as potatoes, peas, and corn.  Fruit and fruit juices.  Milk and yogurt.  Sweets and snack foods, such as cake, cookies, candy, chips, soft drinks, and fruit drinks. CARBOHYDRATE COUNTING There are two ways to count the carbohydrates in your food. You can use either of the methods or a combination of both. Reading the "Nutrition Facts" on [place] The "Nutrition Facts" is an area that is included on the labels of almost all packaged food and beverages in the [place]. It includes the serving size of that food or beverage and information about the nutrients in each serving of the food, including the grams (g) of carbohydrate per serving.  Decide the number of servings of this food or beverage that you will be able to eat or drink. Multiply that number of servings by the number of grams of carbohydrate that is listed on the label for that serving. The total will be the amount of carbohydrates you will be having when you eat or drink  this food or beverage. Learning Standard Serving Sizes of Food When you eat food that is not packaged or does not include "Nutrition Facts" on the label, you need to measure the servings in order to count the amount of carbohydrates.A serving of most carbohydrate-rich foods contains about 15 g of carbohydrates. The following list includes serving sizes of carbohydrate-rich foods that provide 15 g ofcarbohydrate per serving:   1 slice of bread (1 oz) or 1 six-inch tortilla.    of a hamburger bun or English muffin.  4-6 crackers.   cup unsweetened dry cereal.    cup hot cereal.   cup rice or pasta.    cup mashed potatoes or  of a large baked potato.  1 cup fresh fruit or one small piece of fruit.    cup canned or frozen fruit or fruit juice.  1 cup milk.   cup plain fat-free yogurt or yogurt sweetened with artificial sweeteners.   cup cooked dried beans or starchy vegetable, such as peas, corn, or potatoes.  Decide the number of standard-size servings that you will eat. Multiply that number of servings by 15 (the grams of carbohydrates in that serving). For example, if you eat 2 cups of strawberries, you will have eaten 2 servings and 30 g of carbohydrates (2 servings x 15 g = 30 g). For foods such as soups and casseroles, in which more than one food is mixed in, you will need to count the carbohydrates in each food that is  included. EXAMPLE OF CARBOHYDRATE COUNTING Sample Dinner  3 oz chicken breast.   cup of brown rice.   cup of corn.  1 cup milk.   1 cup strawberries with sugar-free whipped topping.  Carbohydrate Calculation Step 1: Identify the foods that contain carbohydrates:   Rice.   Corn.   Milk.   Strawberries. Step 2:Calculate the number of servings eaten of each:   2 servings of rice.   1 serving of corn.   1 serving of milk.   1 serving of strawberries. Step 3: Multiply each of those number of servings by 15 g:   2  servings of rice x 15 g = 30 g.   1 serving of corn x 15 g = 15 g.   1 serving of milk x 15 g = 15 g.   1 serving of strawberries x 15 g = 15 g. Step 4: Add together all of the amounts to find the total grams of carbohydrates eaten: 30 g + 15 g + 15 g + 15 g = 75 g. Document Released: 07/21/2005 Document Revised: 12/05/2013 Document Reviewed: 06/17/2013 [facility] Patient Information 2015 [place], [place]. This information is not intended to replace advice given to you by your health care provider. Make sure you discuss any questions you have with your health care provider.

## 2015-01-19 NOTE — Assessment & Plan Note (Signed)
Well controlled, no changes to meds. Encouraged heart healthy diet such as the DASH diet and exercise as tolerated.  °

## 2015-01-19 NOTE — Assessment & Plan Note (Signed)
Avoid offending foods, start probiotics. Do not eat large meals in late evening and consider raising head of bed.  

## 2015-01-20 LAB — URINE CULTURE
Colony Count: NO GROWTH
ORGANISM ID, BACTERIA: NO GROWTH

## 2015-01-20 LAB — MICROALBUMIN, URINE: MICROALB UR: 5.7 mg/dL — AB (ref <2.0)

## 2015-01-21 NOTE — Assessment & Plan Note (Signed)
Mild asymptomatic will monitor

## 2015-01-21 NOTE — Assessment & Plan Note (Signed)
Referred to podiatry for possible treatment and foot wear
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
## 2015-01-22 ENCOUNTER — Other Ambulatory Visit: Payer: Self-pay | Admitting: Family Medicine

## 2015-01-22 MED ORDER — LISINOPRIL 5 MG PO TABS: ORAL_TABLET | ORAL | Status: DC

## 2015-01-22 MED ORDER — GLIPIZIDE 10 MG PO TABS: 10.0000 mg | ORAL_TABLET | Freq: Two times a day (BID) | ORAL | Status: DC

## 2015-01-22 MED ORDER — SITAGLIPTIN PHOSPHATE 50 MG PO TABS: 50.0000 mg | ORAL_TABLET | Freq: Every day | ORAL | Status: DC

## 2015-01-24 ENCOUNTER — Encounter: Payer: Self-pay | Admitting: Podiatry

## 2015-01-24 ENCOUNTER — Ambulatory Visit (INDEPENDENT_AMBULATORY_CARE_PROVIDER_SITE_OTHER): Payer: Medicare Other | Admitting: Podiatry

## 2015-01-24 VITALS — BP 126/63 | HR 72 | Ht 63.0 in | Wt 128.0 lb

## 2015-01-24 DIAGNOSIS — M778 Other enthesopathies, not elsewhere classified: Secondary | ICD-10-CM

## 2015-01-24 DIAGNOSIS — M7750 Other enthesopathy of unspecified foot: Secondary | ICD-10-CM | POA: Diagnosis not present

## 2015-01-24 DIAGNOSIS — M779 Enthesopathy, unspecified: Principal | ICD-10-CM

## 2015-01-24 DIAGNOSIS — M21969 Unspecified acquired deformity of unspecified lower leg: Secondary | ICD-10-CM | POA: Diagnosis not present

## 2015-01-24 DIAGNOSIS — L570 Actinic keratosis: Secondary | ICD-10-CM

## 2015-01-24 HISTORY — DX: Unspecified acquired deformity of unspecified lower leg: M21.969

## 2015-01-24 HISTORY — DX: Actinic keratosis: L57.0

## 2015-01-24 NOTE — Progress Notes (Signed)
SUBJECTIVE: 69 y.o. year old male presents for diabetic foot care. Patient stated that both feet hurt after been on them, duration of past 3 weeks. Using corn pad to take pressure off.  Blood sugar is up and down and all around, from 200 - 300-400. Been referred to endocrinologist.   REVIEW OF SYSTEMS: Constitutional: negative for chills, fatigue, fevers, malaise and night sweats Eyes: negative for visual disturbance Ears, nose, mouth, throat, and face: negative Respiratory: negative Cardiovascular: negative Gastrointestinal: negative Genitourinary:negative Integument/breast: negative Hematologic/lymphatic: positive for HIV+. Musculoskeletal:negative Neurological: negative Allergic/Immunologic: negative except for HIV  OBJECTIVE: DERMATOLOGIC EXAMINATION: Nails: normal appearing nails bilaterally Positive of plantar callus under 5th MPJ R>L.  VASCULAR EXAMINATION OF LOWER LIMBS: Pedal pulses: Not palpable on both feet. NEUROLOGIC EXAMINATION OF THE LOWER LIMBS: Achilles DTR is present and within normal. Monofilament (Semmes-Weinstein 10-gm) sensory testing positive 6 out of 6, bilateral. Vibratory sensations(128Hz  turning fork) intact at medial and lateral forefoot bilateral.  Sharp and Dull discriminatory sensations at the plantar ball of hallux is intact bilateral.  MUSCULOSKELETAL EXAMINATION: Positive for protruding 5th MPJ bilateral, 3rd MPJ left with pain upon weight bearing.  Normal range of motion on ankle and forefoot joint bilateral. Thin plantar ball fatty layer.  ASSESSMENT: Capsulitis 5th MPJ right with callus, 3rd MPJ left with prolonged weight bearing. Loss of plantar fat pad.  PLAN: Reviewed clinical findings and available treatment options. All calluses debrided. Both feet padded using Metatarsal binder. May benefit from diabetic shoes to provide added cushion on balls of both feet.  Return in 3 months or as needed.

## 2015-01-24 NOTE — Patient Instructions (Signed)
Seen for painful feet. Metatarsal binder with pad dispensed x 2.  May benefit from Diabetic shoes.  Return in 3 months.

## 2015-01-26 ENCOUNTER — Other Ambulatory Visit: Payer: Medicare Other

## 2015-01-26 NOTE — Patient Outreach (Signed)
[facility]) Care Management  01/26/2015  [name] [birth year] [id number]   Referral from [place], assigned [name], RN to outreach.  [name]. [name], [place] Management [place] Assistant Phone: [phone] Fax: [phone]

## 2015-01-29 ENCOUNTER — Other Ambulatory Visit (INDEPENDENT_AMBULATORY_CARE_PROVIDER_SITE_OTHER): Payer: Medicare Other

## 2015-01-29 ENCOUNTER — Other Ambulatory Visit: Payer: Self-pay

## 2015-01-29 ENCOUNTER — Telehealth: Payer: Self-pay | Admitting: *Deleted

## 2015-01-29 DIAGNOSIS — E876 Hypokalemia: Secondary | ICD-10-CM

## 2015-01-29 LAB — COMPREHENSIVE METABOLIC PANEL
ALT: 16 U/L (ref 0–53)
AST: 19 U/L (ref 0–37)
Albumin: 4.1 g/dL (ref 3.5–5.2)
Alkaline Phosphatase: 73 U/L (ref 39–117)
BILIRUBIN TOTAL: 0.6 mg/dL (ref 0.2–1.2)
BUN: 39 mg/dL — ABNORMAL HIGH (ref 6–23)
CO2: 30 mEq/L (ref 19–32)
Calcium: 10.2 mg/dL (ref 8.4–10.5)
Chloride: 97 mEq/L (ref 96–112)
Creatinine, Ser: 2.2 mg/dL — ABNORMAL HIGH (ref 0.40–1.50)
GFR: 31.92 mL/min — AB (ref >60.00)
Glucose, Bld: 159 mg/dL — ABNORMAL HIGH (ref 70–99)
POTASSIUM: 3.4 meq/L — AB (ref 3.5–5.1)
SODIUM: 136 meq/L (ref 135–145)
Total Protein: 7.4 g/dL (ref 6.0–8.3)

## 2015-01-29 NOTE — Telephone Encounter (Signed)
Pt dropped off paperwork need for diabetic footwear. Competed forms are to be faxed to [facility]. Forwarded to Dr. Charlett Blake. JG//CMA

## 2015-01-30 ENCOUNTER — Other Ambulatory Visit: Payer: Self-pay | Admitting: Family Medicine

## 2015-01-30 DIAGNOSIS — N289 Disorder of kidney and ureter, unspecified: Secondary | ICD-10-CM

## 2015-01-31 ENCOUNTER — Telehealth: Payer: Self-pay | Admitting: Family Medicine

## 2015-01-31 NOTE — Telephone Encounter (Signed)
Pt called and and scheduled lab appt to recheck renal panel as requested in lab notes. Sched for 02/06/15 9:30am. Please enter orders as needed.

## 2015-02-01 ENCOUNTER — Encounter: Payer: Self-pay | Admitting: Gastroenterology

## 2015-02-01 NOTE — Telephone Encounter (Signed)
Order was entered on 01/30/15 as instructed by PCP per lab result instructions.  Patient was informed on the 28th done.

## 2015-02-02 ENCOUNTER — Ambulatory Visit (INDEPENDENT_AMBULATORY_CARE_PROVIDER_SITE_OTHER): Payer: Medicare Other | Admitting: Physician Assistant

## 2015-02-02 ENCOUNTER — Encounter: Payer: Self-pay | Admitting: Physician Assistant

## 2015-02-02 VITALS — BP 109/56 | HR 77 | Temp 98.4°F | Ht 63.0 in | Wt 128.6 lb

## 2015-02-02 DIAGNOSIS — J069 Acute upper respiratory infection, unspecified: Secondary | ICD-10-CM | POA: Insufficient documentation

## 2015-02-02 MED ORDER — AZITHROMYCIN 250 MG PO TABS: ORAL_TABLET | ORAL | Status: DC

## 2015-02-02 MED ORDER — BENZONATATE 200 MG PO CAPS: 200.0000 mg | ORAL_CAPSULE | Freq: Three times a day (TID) | ORAL | Status: DC | PRN

## 2015-02-02 NOTE — Assessment & Plan Note (Signed)
Rx [employer]  Increase fluids. Rest. Continue OTC medications as directed. Giving PMH, Rx Z-pack written for patient to take if symptoms not improving within 48 hours.  Follow-up 1 week.

## 2015-02-02 NOTE — Patient Instructions (Signed)
Please stay well hydrated and well-nourished.  Take Tessalon as directed for cough. Put a humidifier in the bedroom. If symptoms not improving over next 48 hours or if anything worsens, start taking the antibiotic as directed.  Follow-up 1 week.  If anything worsens, follow-up sooner.

## 2015-02-02 NOTE — Progress Notes (Signed)
Pre visit review using our clinic review tool, if applicable. No additional management support is needed unless otherwise documented below in the visit note. 

## 2015-02-02 NOTE — Progress Notes (Signed)
Patient presents to clinic today c/o 4.5 days of cough that is dry, sore throat, sinus pressure and chest congestion.  Denies fever, chills, SOB or chest pain.  Endorses mild ear pressure. Denies recent travel or sick contact.  Past Medical History  Diagnosis Date  . Arthritis   . Diabetes mellitus   . History of depression   . GERD (gastroesophageal reflux disease)   . CAD (coronary artery disease)     2003- cabg  . Hypertension   . Hyperlipidemia   . History of kidney stones   . COPD (chronic obstructive pulmonary disease)   . HIV (human immunodeficiency virus infection) 1991    on meds since initial dx.   . Dermatitis 11/27/2012  . Nocturia 03/06/2013  . Epicondylitis 06/05/2013    right  . Pancreatitis 11/2013    attributed to HIV meds.   . Constipation 05/28/2014  . Pain in joint, ankle and foot 01/19/2015    Current Outpatient Prescriptions on File Prior to Visit  Medication Sig Dispense Refill  . amLODipine (NORVASC) 10 MG tablet TAKE 1 TABLET BY MOUTH EVERY NIGHT AT BEDTIME 90 tablet 1  . aspirin 81 MG chewable tablet Chew 81 mg by mouth daily.    Marland Kitchen atorvastatin (LIPITOR) 10 MG tablet TAKE 1 TABLET BY MOUTH DAILY 30 tablet 6  . b complex vitamins tablet Take 1 tablet by mouth daily.    . Cholecalciferol 2000 UNITS CAPS Take by mouth daily.    . clotrimazole-betamethasone (LOTRISONE) cream Apply 1 application topically 2 (two) times daily. apply to lesion on chest wall 45 g 1  . dolutegravir (TIVICAY) 50 MG tablet Take 1 tablet (50 mg total) by mouth daily. 30 tablet 11  . furosemide (LASIX) 20 MG tablet Take 20 mg by mouth daily as needed for fluid or edema.    Marland Kitchen glipiZIDE (GLUCOTROL) 10 MG tablet Take 1 tablet (10 mg total) by mouth 2 (two) times daily before a meal. 60 tablet 3  . glucose blood (FREESTYLE TEST STRIPS) test strip USE TO CHECK BLOOD SUGAR  THREE TIMES A DAY 100 each 3  . KRILL OIL OMEGA-3 PO Take 1 capsule by mouth daily.    . Lancets (FREESTYLE)  lancets Use to check blood sugars 3 times daily. 100 each 3  . lisinopril (PRINIVIL,ZESTRIL) 5 MG tablet Take 1/2 by mouth daily 15 tablet 3  . metolazone (ZAROXOLYN) 2.5 MG tablet TAKE 1 TABLET BY MOUTH EVERY DAY 20 MINUTES PRIOR TO LASIX 30 tablet 0  . NORVIR 100 MG TABS tablet TAKE 1 TABLET BY MOUTH EVERY DAY 30 tablet 3  . omeprazole (PRILOSEC) 20 MG capsule Take 1 capsule (20 mg total) by mouth daily. Take 12 hours apart from [place] dose. 30 capsule 5  . potassium chloride SA (K-DUR,KLOR-CON) 20 MEQ tablet Take 1 tablet (20 mEq total) by mouth 2 (two) times daily. 60 tablet 6  . PREZISTA 800 MG tablet TAKE 1 TABLET BY MOUTH EVERY DAY 30 tablet 3  . Probiotic Product (PROBIOTIC DAILY PO) Take 1 tablet by mouth daily.     . rilpivirine (EDURANT) 25 MG TABS tablet Take 1 tablet (25 mg total) by mouth daily with breakfast. 30 tablet 11  . sitaGLIPtin (JANUVIA) 50 MG tablet Take 1 tablet (50 mg total) by mouth daily. 30 tablet 3  . sodium bicarbonate 650 MG tablet Take 650 mg by mouth 3 (three) times daily.    . valACYclovir (VALTREX) 500 MG tablet TAKE 1 TABLET  BY MOUTH TWICE DAILY (Patient taking differently: TAKE 1 TABLET BY MOUTH  DAILY) 60 tablet 5   No current facility-administered medications on file prior to visit.    No Known Allergies  Family History  Problem Relation Age of Onset  . Arthritis      mother/father/paternal grandparents  . Breast cancer Maternal Aunt     paternal aunt  . Lung cancer Maternal Aunt   . Hyperlipidemia Mother   . Hyperlipidemia Father   . Heart disease      parents/maternal grandparents/ 2 brothers  . Stroke Paternal Grandmother   . Hypertension Father     paternal grandmother/3 brothers/1 sister  . Mental retardation Sister   . Diabetes Mother     paternal grandparents/1 brother    History   Social History  . Marital Status: Divorced    Spouse Name: N/A  . Number of Children: 4  . Years of Education: N/A   Occupational History  .  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    Social History Main Topics  . Smoking status: Never Smoker   . Smokeless tobacco: Never Used  . Alcohol Use: No  . Drug Use: No  . Sexual Activity: Not Currently     Comment: declined condoms   Other Topics Concern  . None   Social History Narrative   Lives alone.  Supportive friends and family.  His HIV Dx is not a secret.    Review of Systems - See HPI.  All other ROS are negative.  BP 109/56 mmHg  Pulse 77  Temp(Src) 98.4 F (36.9 C) (Oral)  Ht '5\' 3"'  (1.6 m)  Wt 128 lb 9.6 oz (58.333 kg)  BMI 22.79 kg/m2  SpO2 100%  Physical Exam  Constitutional: He is oriented to person, place, and time and well-developed, well-nourished, and in no distress.  HENT:  Head: Normocephalic and atraumatic.  Right Ear: External ear normal.  Left Ear: External ear normal.  Nose: Nose normal.  Mouth/Throat: Oropharynx is clear and moist. No oropharyngeal exudate.  TM within normal limits bilaterally.  Eyes: Conjunctivae are normal.  Neck: Neck supple.  Cardiovascular: Normal rate, regular rhythm, normal heart sounds and intact distal pulses.   Pulmonary/Chest: Effort normal and breath sounds normal. No respiratory distress. He has no wheezes. He has no rales. He exhibits no tenderness.  Lymphadenopathy:    He has no cervical adenopathy.  Neurological: He is alert and oriented to person, place, and time.  Skin: Skin is warm and dry. No rash noted.  Psychiatric: Affect normal.  Vitals reviewed.   Recent Results (from the past 2160 hour(s))  TSH     Status: None   Collection Time: 01/19/15 11:28 AM  Result Value Ref Range   TSH 1.82 0.35 - 4.50 uIU/mL  CBC     Status: Abnormal   Collection Time: 01/19/15 11:28 AM  Result Value Ref Range   WBC 5.6 4.0 - 10.5 K/uL   RBC 4.00 (L) 4.22 - 5.81 Mil/uL   Platelets 214.0 150.0 - 400.0 K/uL   Hemoglobin 12.3 (L) 13.0 - 17.0 g/dL   HCT 36.1 (L) 39.0 - 52.0 %    MCV 90.3 78.0 - 100.0 fl   MCHC 33.9 30.0 - 36.0 g/dL   RDW 14.4 11.5 - 15.5 %  Comp Met (CMET)     Status: Abnormal   Collection Time: 01/19/15 11:28 AM  Result Value Ref Range   Sodium  133 (L) 135 - 145 mEq/L   Potassium 3.1 (L) 3.5 - 5.1 mEq/L   Chloride 93 (L) 96 - 112 mEq/L   CO2 29 19 - 32 mEq/L   Glucose, Bld 431 (H) 70 - 99 mg/dL   BUN 34 (H) 6 - 23 mg/dL   Creatinine, Ser 1.76 (H) 0.40 - 1.50 mg/dL   Total Bilirubin 0.6 0.2 - 1.2 mg/dL   Alkaline Phosphatase 106 39 - 117 U/L   AST 15 0 - 37 U/L   ALT 19 0 - 53 U/L   Total Protein 7.6 6.0 - 8.3 g/dL   Albumin 4.4 3.5 - 5.2 g/dL   Calcium 10.1 8.4 - 10.5 mg/dL   GFR 41.30 (L) >60.00 mL/min  Lipid Profile     Status: Abnormal   Collection Time: 01/19/15 11:28 AM  Result Value Ref Range   Cholesterol 157 0 - 200 mg/dL    Comment: ATP III Classification       Desirable:  < 200 mg/dL               Borderline High:  200 - 239 mg/dL          High:  > = 240 mg/dL   Triglycerides 269.0 (H) 0.0 - 149.0 mg/dL    Comment: Normal:  <150 mg/dLBorderline High:  150 - 199 mg/dL   HDL 33.40 (L) >39.00 mg/dL   VLDL 53.8 (H) 0.0 - 40.0 mg/dL   Total CHOL/HDL Ratio 5     Comment:                Men          Women1/2 Average Risk     3.4          3.3Average Risk          5.0          4.42X Average Risk          9.6          7.13X Average Risk          15.0          11.0                       NonHDL 123.60     Comment: NOTE:  Non-HDL goal should be 30 mg/dL higher than patient's LDL goal (i.e. LDL goal of < 70 mg/dL, would have non-HDL goal of < 100 mg/dL)  HgB A1c     Status: Abnormal   Collection Time: 01/19/15 11:28 AM  Result Value Ref Range   Hgb A1c MFr Bld 9.5 (H) 4.6 - 6.5 %    Comment: Glycemic Control Guidelines for People with Diabetes:Non Diabetic:  <6%Goal of Therapy: <7%Additional Action Suggested:  >8%   Urinalysis     Status: Abnormal   Collection Time: 01/19/15 11:28 AM  Result Value Ref Range   Color, Urine YELLOW  Yellow;Lt. Yellow   APPearance CLEAR Clear   Specific Gravity, Urine 1.010 1.000-1.030   pH 6.5 5.0 - 8.0   Total Protein, Urine NEGATIVE Negative   Urine Glucose >=1000 (A) Negative   Ketones, ur NEGATIVE Negative   Bilirubin Urine NEGATIVE Negative   Hgb urine dipstick TRACE-LYSED (A) Negative   Urobilinogen, UA 0.2 0.0 - 1.0   Leukocytes, UA NEGATIVE Negative   Nitrite NEGATIVE Negative  Microalbumin, urine     Status: Abnormal   Collection Time: 01/19/15 11:28 AM  Result Value Ref  Range   Microalb, Ur 5.7 (H) <2.0 mg/dL    Comment: The ADA (Diabetes Care 4199;14(CQPEA 1):S14-S80) has defined abnormalities in albumin excretion as follows:            Category           Result                            (mg/g creatinine)                 Normal:    <30       Microalbuminuria:    30 - 299   Clinical albuminuria:    > or = 300    The ADA recommends that at least two of three specimens collected within a 3 - 6 month period be abnormal before considering a patient to be within a diagnostic category.   Urine Culture     Status: None   Collection Time: 01/19/15 11:28 AM  Result Value Ref Range   Colony Count NO GROWTH    Organism ID, Bacteria NO GROWTH   LDL cholesterol, direct     Status: None   Collection Time: 01/19/15 11:28 AM  Result Value Ref Range   Direct LDL 80.0 mg/dL    Comment: Optimal:  <100 mg/dLNear or Above Optimal:  100-129 mg/dLBorderline High:  130-159 mg/dLHigh:  160-189 mg/dLVery High:  >190 mg/dL  Comp Met (CMET)     Status: Abnormal   Collection Time: 01/29/15  8:27 AM  Result Value Ref Range   Sodium 136 135 - 145 mEq/L   Potassium 3.4 (L) 3.5 - 5.1 mEq/L   Chloride 97 96 - 112 mEq/L   CO2 30 19 - 32 mEq/L   Glucose, Bld 159 (H) 70 - 99 mg/dL   BUN 39 (H) 6 - 23 mg/dL   Creatinine, Ser 2.20 (H) 0.40 - 1.50 mg/dL   Total Bilirubin 0.6 0.2 - 1.2 mg/dL   Alkaline Phosphatase 73 39 - 117 U/L   AST 19 0 - 37 U/L   ALT 16 0 - 53 U/L   Total Protein 7.4  6.0 - 8.3 g/dL   Albumin 4.1 3.5 - 5.2 g/dL   Calcium 10.2 8.4 - 10.5 mg/dL   GFR 31.92 (L) >60.00 mL/min    Assessment/Plan: URI (upper respiratory infection) Rx Tessalon Perles.  Increase fluids. Rest. Continue OTC medications as directed. Giving PMH, Rx Z-pack written for patient to take if symptoms not improving within 48 hours.  Follow-up 1 week.

## 2015-02-06 ENCOUNTER — Other Ambulatory Visit (INDEPENDENT_AMBULATORY_CARE_PROVIDER_SITE_OTHER): Payer: Medicare Other

## 2015-02-06 DIAGNOSIS — N259 Disorder resulting from impaired renal tubular function, unspecified: Secondary | ICD-10-CM

## 2015-02-06 DIAGNOSIS — N289 Disorder of kidney and ureter, unspecified: Secondary | ICD-10-CM

## 2015-02-06 LAB — RENAL FUNCTION PANEL
ALBUMIN: 3.6 g/dL (ref 3.5–5.2)
BUN: 36 mg/dL — AB (ref 6–23)
CALCIUM: 8.9 mg/dL (ref 8.4–10.5)
CO2: 25 meq/L (ref 19–32)
CREATININE: 1.78 mg/dL — AB (ref 0.40–1.50)
Chloride: 102 mEq/L (ref 96–112)
GFR: 40.76 mL/min — ABNORMAL LOW (ref >60.00)
GLUCOSE: 254 mg/dL — AB (ref 70–99)
Phosphorus: 2.8 mg/dL (ref 2.3–4.6)
Potassium: 3.6 mEq/L (ref 3.5–5.1)
Sodium: 138 mEq/L (ref 135–145)

## 2015-02-07 ENCOUNTER — Telehealth: Payer: Self-pay

## 2015-02-07 NOTE — Telephone Encounter (Signed)
Forms along w/ OV notes faxed successfully to [facility] at [phone]. Sent for scanning. JG//CMA

## 2015-02-07 NOTE — Telephone Encounter (Signed)
Forms completed and faxed along with office notes to [facility].  Fax confirmation received.  Forms numbered and placed in scan basket for scanning.

## 2015-02-13 ENCOUNTER — Other Ambulatory Visit: Payer: Self-pay | Admitting: Family Medicine

## 2015-02-13 MED ORDER — GLUCOSE BLOOD VI STRP: ORAL_STRIP | Status: DC

## 2015-02-13 MED ORDER — FREESTYLE LANCETS MISC: Status: DC

## 2015-02-15 ENCOUNTER — Encounter: Payer: Self-pay | Admitting: Family Medicine

## 2015-02-16 ENCOUNTER — Other Ambulatory Visit: Payer: Self-pay | Admitting: Family Medicine

## 2015-02-16 MED ORDER — FREESTYLE LANCETS MISC: Status: DC

## 2015-02-16 MED ORDER — GLUCOSE BLOOD VI STRP: ORAL_STRIP | Status: DC

## 2015-02-18 ENCOUNTER — Other Ambulatory Visit: Payer: Self-pay | Admitting: Family Medicine

## 2015-02-28 ENCOUNTER — Encounter: Payer: Self-pay | Admitting: Endocrinology

## 2015-02-28 ENCOUNTER — Ambulatory Visit (INDEPENDENT_AMBULATORY_CARE_PROVIDER_SITE_OTHER): Payer: Medicare Other | Admitting: Endocrinology

## 2015-02-28 VITALS — BP 100/58 | HR 79 | Temp 97.8°F | Resp 14 | Ht 63.0 in | Wt 130.6 lb

## 2015-02-28 DIAGNOSIS — E1165 Type 2 diabetes mellitus with hyperglycemia: Secondary | ICD-10-CM

## 2015-02-28 DIAGNOSIS — IMO0002 Reserved for concepts with insufficient information to code with codable children: Secondary | ICD-10-CM

## 2015-02-28 DIAGNOSIS — N183 Chronic kidney disease, stage 3 (moderate): Secondary | ICD-10-CM | POA: Diagnosis not present

## 2015-02-28 DIAGNOSIS — E785 Hyperlipidemia, unspecified: Secondary | ICD-10-CM | POA: Diagnosis not present

## 2015-02-28 MED ORDER — GLUCOSE BLOOD VI STRP: ORAL_STRIP | Status: DC

## 2015-02-28 MED ORDER — REPAGLINIDE 2 MG PO TABS: 2.0000 mg | ORAL_TABLET | Freq: Three times a day (TID) | ORAL | Status: DC

## 2015-02-28 NOTE — Patient Instructions (Signed)
Check blood sugars on waking up .. 3 .. times a week Also check blood sugars about 2 hours after a meal and do this after different meals by rotation  Recommended blood sugar levels on waking up is 90-130 and about 2 hours after meal is 140-180 Please bring blood sugar monitor to each visit.  Change Glipizide to Prandin 2mg  before each meal

## 2015-02-28 NOTE — Progress Notes (Signed)
Patient ID: [name], male   DOB: February 26, 1946, 69 y.o.   MRN: [id number]           Reason for Appointment: Consultation for Type 2 Diabetes  Referring physician: Charlett Blake  History of Present Illness:          Date of diagnosis of type 2 diabetes mellitus: 2013        Background history:  He only mildly increased blood sugar levels at the time of diagnosis and A1c was 7.1  He had been treated initially with metformin but this was stopped subsequently when his renal function was worse  He was subsequently treated with glipizide but his blood sugars have been poorly controlled in 2016 with glipizide alone He has not been treated with any medications until recently  Recent history:   Since his A1c has been persistently over 9% he was started on Januvia 50 mg in 01/2015 Prior to this he thinks his blood sugars were in the 200-300 range and he was having symptoms of blurred vision and some fatigue His glipizide dose has also been increased progressively this year He thinks his blood sugars have been significantly better with adding Januvia and increasing glipizide, however since he does not have any test strips he has not checked his blood sugar in the last couple of weeks  Current blood sugar patterns and problems identified:  He thinks his blood sugars are in the normal range in the mornings and even if it is around 60 will not have any symptoms  He will sometimes check his blood sugar before or after dinner and usually blood sugars and not over 150  He is trying to do a little walking periodically but is not able to do much  He has not been given any diabetes education but he thinks he is trying to eat healthy meals within his financial limitations     Oral hypoglycemic drugs the patient is taking are:   glipizide 10 mg twice a day, Januvia 50 mg daily     Side effects from medications have been: None  Compliance with the medical regimen: Good  Hypoglycemia: None    Glucose  monitoring:  done 0-2  times a day         Glucometer:  FreeStyle     Blood Glucose readings by recall recently  PREMEAL Breakfast Lunch Dinner Bedtime  Overall   Glucose range:  60-100     150    Median:         Self-care: The diet that the patient has been following is: tries to limit sweets.     Typical meal intake: He had grade chicken sandwich at lunch, avoids drinks with sugar              Dietician visit, most recent: none               Exercise:  walking 2/7 days a week, about 1 mile  Weight history:   Wt Readings from Last 3 Encounters:  02/28/15 130 lb 9.6 oz (59.24 kg)  02/02/15 128 lb 9.6 oz (58.333 kg)  01/24/15 128 lb (58.06 kg)    Glycemic control:   Lab Results  Component Value Date   HGBA1C 9.5* 01/19/2015   HGBA1C 9.4* 10/10/2014   HGBA1C 6.5 06/23/2014   Lab Results  Component Value Date   MICROALBUR 5.7* 01/19/2015   LDLCALC 84 06/23/2014   CREATININE 1.78* 02/06/2015         Medication  List       This list is accurate as of: 02/28/15  8:32 PM.  Always use your most recent med list.               amLODipine 10 MG tablet  Commonly known as:  NORVASC  TAKE 1 TABLET BY MOUTH EVERY NIGHT AT BEDTIME     aspirin 81 MG chewable tablet  Chew 81 mg by mouth daily.     atorvastatin 10 MG tablet  Commonly known as:  LIPITOR  TAKE 1 TABLET BY MOUTH DAILY     b complex vitamins tablet  Take 1 tablet by mouth daily.     Cholecalciferol 2000 UNITS Caps  Take by mouth daily.     clotrimazole-betamethasone cream  Commonly known as:  LOTRISONE  Apply 1 application topically 2 (two) times daily. apply to lesion on chest wall     dolutegravir 50 MG tablet  Commonly known as:  TIVICAY  Take 1 tablet (50 mg total) by mouth daily.     freestyle lancets  Use to check blood sugars 3 times daily.     furosemide 20 MG tablet  Commonly known as:  LASIX  Take 20 mg by mouth daily as needed for fluid or edema.     glucose blood test strip    Commonly known as:  FREESTYLE TEST STRIPS  USE TO CHECK BLOOD SUGAR once daily     KRILL OIL OMEGA-3 PO  Take 1 capsule by mouth daily.     lisinopril 5 MG tablet  Commonly known as:  PRINIVIL,ZESTRIL  Take 1/2 by mouth daily     metolazone 2.5 MG tablet  Commonly known as:  ZAROXOLYN  TAKE 1 TABLET BY MOUTH EVERY DAY 20 MINUTES PRIOR TO LASIX     NORVIR 100 MG Tabs tablet  Generic drug:  ritonavir  TAKE 1 TABLET BY MOUTH EVERY DAY     omeprazole 20 MG capsule  Commonly known as:  PRILOSEC  Take 1 capsule (20 mg total) by mouth daily. Take 12 hours apart from Edurant dose.     potassium chloride SA 20 MEQ tablet  Commonly known as:  K-DUR,KLOR-CON  Take 1 tablet (20 mEq total) by mouth 2 (two) times daily.     PREZISTA 800 MG tablet  Generic drug:  Darunavir Ethanolate  TAKE 1 TABLET BY MOUTH EVERY DAY     PROBIOTIC DAILY PO  Take 1 tablet by mouth daily.     repaglinide 2 MG tablet  Commonly known as:  PRANDIN  Take 1 tablet (2 mg total) by mouth 3 (three) times daily before meals.     rilpivirine 25 MG Tabs tablet  Commonly known as:  EDURANT  Take 1 tablet (25 mg total) by mouth daily with breakfast.     sitaGLIPtin 50 MG tablet  Commonly known as:  JANUVIA  Take 1 tablet (50 mg total) by mouth daily.     sodium bicarbonate 650 MG tablet  Take 650 mg by mouth 3 (three) times daily.     valACYclovir 500 MG tablet  Commonly known as:  VALTREX  TAKE 1 TABLET BY MOUTH TWICE DAILY        Allergies: No Known Allergies  Past Medical History  Diagnosis Date  . Arthritis   . Diabetes mellitus   . History of depression   . GERD (gastroesophageal reflux disease)   . CAD (coronary artery disease)     2003- cabg  . Hypertension   .  Hyperlipidemia   . History of kidney stones   . COPD (chronic obstructive pulmonary disease)   . HIV (human immunodeficiency virus infection) 1991    on meds since initial dx.   . Dermatitis 11/27/2012  . Nocturia 03/06/2013  .  Epicondylitis 06/05/2013    right  . Pancreatitis 11/2013    attributed to HIV meds.   . Constipation 05/28/2014  . Pain in joint, ankle and foot 01/19/2015    Past Surgical History  Procedure Laterality Date  . Coronary artery bypass graft  05/2002  . Vasectomy      Family History  Problem Relation Age of Onset  . Arthritis      mother/father/paternal grandparents  . Breast cancer Maternal Aunt     paternal aunt  . Lung cancer Maternal Aunt   . Hyperlipidemia Mother   . Hyperlipidemia Father   . Heart disease      parents/maternal grandparents/ 2 brothers  . Stroke Paternal Grandmother   . Hypertension Father     paternal grandmother/3 brothers/1 sister  . Mental retardation Sister   . Diabetes Mother     paternal grandparents/1 brother    Social History:  reports that he has never smoked. He has never used smokeless tobacco. He reports that he does not drink alcohol or use illicit drugs.    Review of Systems    Lipid history: On Lipitor 10 mg only, previously had been on gemfibrozil and fenofibrate also    Lab Results  Component Value Date   CHOL 157 01/19/2015   HDL 33.40* 01/19/2015   LDLCALC 84 06/23/2014   LDLDIRECT 80.0 01/19/2015   TRIG 269.0* 01/19/2015   CHOLHDL 5 01/19/2015           Constitutional: no recent weight gain/loss, no complaints of unusual fatigue   Eyes: no history of blurred vision.  Most recent eye exam was nearly a year ago in 2015, reportedly normal   ENT: no nasal congestion  Cardiovascular: no chest pain or tightness on exertion.   He has had a history of leg edema of unclear etiology, taking 2 diuretics Also has had hypokalemia treated with potassium 40 mEq daily  RENAL dysfunction: Followed by nephrologist, etiology unclear  Lab Results  Component Value Date   CREATININE 1.78* 02/06/2015   BUN 36* 02/06/2015   NA 138 02/06/2015   K 3.6 02/06/2015   CL 102 02/06/2015   CO2 25 02/06/2015     Hypertension: Has been  treated with various drugs in the past, now taking only amlodipine and low-dose lisinopril  Respiratory: no cough/shortness of breath  Gastrointestinal: no constipation, diarrhea, nausea or abdominal pain  Musculoskeletal: no muscle/joint aches   Urological:   No frequency of urination or  nocturia  Skin: no rash or infections  Neurological: no headaches.  Has no numbness, burning, pains or tingling in feet    Psychiatric: no symptoms of depression  Endocrine: No unusual fatigue, cold intolerance or history of thyroid disease   LABS:  No visits with results within 1 Week(s) from this visit. Latest known visit with results is:  Lab on 02/06/2015  Component Date Value Ref Range Status  . Sodium 02/06/2015 138  135 - 145 mEq/L Final  . Potassium 02/06/2015 3.6  3.5 - 5.1 mEq/L Final  . Chloride 02/06/2015 102  96 - 112 mEq/L Final  . CO2 02/06/2015 25  19 - 32 mEq/L Final  . Calcium 02/06/2015 8.9  8.4 - 10.5 mg/dL Final  .  Albumin 02/06/2015 3.6  3.5 - 5.2 g/dL Final  . BUN 02/06/2015 36* 6 - 23 mg/dL Final  . Creatinine, Ser 02/06/2015 1.78* 0.40 - 1.50 mg/dL Final  . Glucose, Bld 02/06/2015 254* 70 - 99 mg/dL Final  . Phosphorus 02/06/2015 2.8  2.3 - 4.6 mg/dL Final  . GFR 02/06/2015 40.76* >60.00 mL/min Final    Physical Examination:  BP 100/58 mmHg  Pulse 79  Temp(Src) 97.8 F (36.6 C)  Resp 14  Ht 5\' 3"  (1.6 m)  Wt 130 lb 9.6 oz (59.24 kg)  BMI 23.14 kg/m2  SpO2 96%  GENERAL:         Patient has abdominal obesity.   HEENT:         Eye exam shows normal external appearance. Fundus exam shows no retinopathy.  Oral exam shows normal mucosa .  NECK:   There is no lymphadenopathy Thyroid is not enlarged and no nodules felt.  Carotids are normal to palpation.  Left-sided bruit or transmitted murmur heard LUNGS:         Chest is symmetrical and appears hyperinflated.  Somewhat resonant over the lower rib cage on the right.  Lungs are clear to auscultation.Marland Kitchen     HEART:  apex feels normal.         Heart sounds:  S1 and S2 are normal. No S3 or S4. grade 3-4 systolic harsh murmur heard all over precordium with some radiation towards carotid  ABDOMEN:   There is no distention present. Liver and spleen are not palpable. No other mass or tenderness present.   NEUROLOGICAL:   Vibration sense is mildly reduced in distal first toes. Ankle jerks are absent bilaterally.          Diabetic foot exam shows normal monofilament sensation in the toes and plantar surfaces, no skin lesions or ulcers on the feet and practically absent pedal pulses MUSCULOSKELETAL:  There is no swelling or deformity of the peripheral joints. Spine is normal to inspection.   EXTREMITIES:     There is 1+ edema especially on the left. No skin lesions present.Marland Kitchen SKIN:       No rash or lesions of concern.        ASSESSMENT:  Diabetes type 2, uncontrolled with BMI 23 but has some abdominal obesity    He appears to have had improved blood sugars with increasing glipizide and adding low-dose Januvia However his previous A1c has been over 9% and unlikely that he will have adequate control with this regimen Glucose today is high at 180 about 3 hours after a sandwich at lunch Unable to assess his recent blood sugars since he has not been able to check his glucose at home  Although he is usually having a healthy meal plan and trying to walk he appears to have had progressive increase in blood sugars especially with stopping metformin Most likely will benefit better from adding a GLP-1 drug However since most of his high readings are postprandial he may benefit from changing glipizide to Prandin Not clear if he has any residual damage to his islet cell function from pancreatitis in 2015; may require insulin if usual hypoglycemic drugs do not work  Complications: Peripheral vascular disease, history of coronary artery disease  HYPERTENSION: Appears well controlled with amlodipine and low-dose  lisinopril  Hyperlipidemia: Currently only on Lipitor 10 mg Still has high triglycerides and LDL over his target of 70, currently being managed by PCP and cardiologist  Systolic murmur: We will defer management  to cardiologist  CKD: Recent creatinine clearance 41, followed by nephrologist   PLAN:   Change glipizide to Prandin 2 mg before each meal, discussed need to take this before eating and timing of the medication  The dose may be adjusted further based on his postprandial readings  He was given test strips for rechecking his blood sugars and discussed blood sugars targets at various times  He will be scheduled to see the dietitian on the next visit for me planning  Encouraged him to be as active as possible  Continue Januvia for now  If blood sugars are not improved on the next visit in 3 weeks will change Januvia to Victoza, will also check fructosamine    Patient Instructions  Check blood sugars on waking up .. 3 .. times a week Also check blood sugars about 2 hours after a meal and do this after different meals by rotation  Recommended blood sugar levels on waking up is 90-130 and about 2 hours after meal is 140-180 Please bring blood sugar monitor to each visit.  Change Glipizide to Prandin 2mg  before each meal      Tylique Aull 02/28/2015, 8:32 PM   Note: This office note was prepared with Dragon voice recognition system technology. Any transcriptional errors that result from this process are unintentional.

## 2015-03-07 ENCOUNTER — Ambulatory Visit: Payer: Medicare Other

## 2015-03-07 ENCOUNTER — Telehealth: Payer: Self-pay | Admitting: *Deleted

## 2015-03-07 NOTE — Telephone Encounter (Signed)
Patient will come in today to renew his ADAP.

## 2015-03-12 ENCOUNTER — Other Ambulatory Visit: Payer: Self-pay

## 2015-03-12 DIAGNOSIS — E1110 Type 2 diabetes mellitus with ketoacidosis without coma: Secondary | ICD-10-CM

## 2015-03-12 NOTE — Patient Outreach (Signed)
[facility]) Care Management  03/12/2015  [name] 1945-09-21 [id number]   SUBJECTIVE: Telephone call to patient regarding high risk referral.  Discussed and offered [facility] care management services to patient.  Patient verbally agrees to receive services.  Patient states he needs additional help to manage his diabetes:  Patient states he does not recall having formal diabetic education after being diagnosed with diabetes approximately 2 years ago.  Patient states his blood sugars run up and down.  Patient states his blood sugars have been in the 60's and has been as high as the glucometer only stating, "High"  Patient states he is unsure of what his A1C is.  Patient reports his primary doctor referred him to an endocrinologist this year, 2016.  Patient states his endocrinologist, Dr. Dwyane Dee added an additional diabetic medication, Januvia.  Patient states he is suppose to follow up with his endocrinologist in 1 month.   ASSESSMENT: Patient will benefit from health coach referral for diabetes education and management.    per EPIC note from Dr. Ronnie Derby office 02/28/15, patients A1c over 9%.   PLAN: RNCM will refer patient to health coach.

## 2015-03-14 NOTE — Patient Outreach (Signed)
[place] [facility]) Care Management  03/14/2015  DVANTE HANDS 1946/03/20 [id number]   Request from [name], RN to assign [place], assigned Candie Mile, RN.  Thanks, [name]. [place], [place] Assistant Phone: [phone] Fax: [phone]

## 2015-03-15 ENCOUNTER — Other Ambulatory Visit: Payer: Self-pay

## 2015-03-15 NOTE — Patient Outreach (Signed)
[facility]) Care Management  [place]  03/15/2015   [name] Nov 01, 1945    Telephonic assessment conducted today.  Patient is accepting of telephonic disease management program for improving self-management of his Diabetes Type II.  He reports regular monitoring of blood sugars that have fluctuated up to 200.  He was referred to Dr. Dwyane Dee on 02-28-15, at which time his A1C was above 9.   Prandin 3 times a day was added to his Januvia regimen, and he feels that his blood sugars have stabilized some since then.  Fasting blood sugar today was 114.  Current Medications:  Current Outpatient Prescriptions  Medication Sig Dispense Refill  . amLODipine (NORVASC) 10 MG tablet TAKE 1 TABLET BY MOUTH EVERY NIGHT AT BEDTIME 90 tablet 1  . aspirin 81 MG chewable tablet Chew 81 mg by mouth daily.    Marland Kitchen atorvastatin (LIPITOR) 10 MG tablet TAKE 1 TABLET BY MOUTH DAILY 30 tablet 6  . b complex vitamins tablet Take 1 tablet by mouth daily.    . Cholecalciferol 2000 UNITS CAPS Take by mouth daily.    . clotrimazole-betamethasone (LOTRISONE) cream Apply 1 application topically 2 (two) times daily. apply to lesion on chest wall 45 g 1  . dolutegravir (TIVICAY) 50 MG tablet Take 1 tablet (50 mg total) by mouth daily. 30 tablet 11  . furosemide (LASIX) 20 MG tablet Take 20 mg by mouth daily as needed for fluid or edema.    Marland Kitchen glucose blood (FREESTYLE TEST STRIPS) test strip USE TO CHECK BLOOD SUGAR once daily 50 each 5  . Lancets (FREESTYLE) lancets Use to check blood sugars 3 times daily. 100 each 5  . lisinopril (PRINIVIL,ZESTRIL) 5 MG tablet Take 1/2 by mouth daily (Patient taking differently: Take 5 mg by mouth every other day. Take 1/2 by mouth daily) 15 tablet 3  . metolazone (ZAROXOLYN) 2.5 MG tablet TAKE 1 TABLET BY MOUTH EVERY DAY 20 MINUTES PRIOR TO LASIX 30 tablet 0  . NORVIR 100 MG TABS tablet TAKE 1 TABLET BY MOUTH EVERY DAY 30 tablet 3  . omeprazole (PRILOSEC) 20 MG  capsule Take 1 capsule (20 mg total) by mouth daily. Take 12 hours apart from Edurant dose. 30 capsule 5  . potassium chloride SA (K-DUR,KLOR-CON) 20 MEQ tablet Take 1 tablet (20 mEq total) by mouth 2 (two) times daily. 60 tablet 6  . PREZISTA 800 MG tablet TAKE 1 TABLET BY MOUTH EVERY DAY 30 tablet 3  . Probiotic Product (PROBIOTIC DAILY PO) Take 1 tablet by mouth daily.     . repaglinide (PRANDIN) 2 MG tablet Take 1 tablet (2 mg total) by mouth 3 (three) times daily before meals. 90 tablet 2  . rilpivirine (EDURANT) 25 MG TABS tablet Take 1 tablet (25 mg total) by mouth daily with breakfast. 30 tablet 11  . sitaGLIPtin (JANUVIA) 50 MG tablet Take 1 tablet (50 mg total) by mouth daily. 30 tablet 3  . sodium bicarbonate 650 MG tablet Take 650 mg by mouth 3 (three) times daily.    . valACYclovir (VALTREX) 500 MG tablet TAKE 1 TABLET BY MOUTH TWICE DAILY (Patient taking differently: TAKE 1 TABLET BY MOUTH  DAILY) 60 tablet 5  . KRILL OIL OMEGA-3 PO Take 1 capsule by mouth daily.     No current facility-administered medications for this visit.    Functional Status:  In your present state of health, do you have any difficulty performing the following activities: 03/15/2015 03/15/2015  Hearing? - Y  Vision? (No Data) N  Difficulty concentrating or making decisions? - N  Walking or climbing stairs? (No Data) Y  Dressing or bathing? - N  Doing errands, shopping? N N  Preparing Food and eating ? - N  Using the Toilet? - N  In the past six months, have you accidently leaked urine? - N  Do you have problems with loss of bowel control? - N  Managing your Medications? - N  Managing your Finances? - N  Housekeeping or managing your Housekeeping? - N    Fall/Depression Screening: PHQ 2/9 Scores 03/15/2015 10/05/2014 06/05/2014 02/14/2014 12/15/2013 07/26/2013 01/11/2013  PHQ - 2 Score 0 0 0 0 0 0 0    Assessment: Patient is eager to improve his diabetic management.  He states he could use education on           diabetic diet guidelines.  He also would benefit from increasing his activity level.  Plan: Complete assessment on Monday August 15th.           Request [facility] referral to [facility]           Arrange for access to [facility] educational videos on Diabetes.           Promote dietary and activity changes: his goal is to improve his A1C reading to 7 within 90 days.   Candie Mile, RN, MSN [place] [phone] Fax [phone]

## 2015-03-16 ENCOUNTER — Other Ambulatory Visit: Payer: Self-pay | Admitting: Family Medicine

## 2015-03-16 DIAGNOSIS — E1149 Type 2 diabetes mellitus with other diabetic neurological complication: Secondary | ICD-10-CM

## 2015-03-19 ENCOUNTER — Other Ambulatory Visit: Payer: Self-pay | Admitting: Family Medicine

## 2015-03-19 ENCOUNTER — Other Ambulatory Visit: Payer: Self-pay

## 2015-03-19 NOTE — Patient Outreach (Signed)
[name] Care Management  03/19/2015  [name] Jul 29, 1946 [id number]  Telephone call today to finish initial assessment. Patient has received EMMI email for accessing educational  materials, and plans to review them before our next contact.  Per RN Health Coach request, Dr. Charlett Blake has sent a referral to the [facility], and patient states he will call them to schedule an appointment.    Plan:  Mail materials on Advance Directives as requested by patient.           Patient will improve Diabetic Self-management by increasing adherence to diabetic diet guidelines and                    implementing a walking program for increasing activity level.           Follow-up telephone contact scheduled for 04-12-15.  Candie Mile, RN, MSN [place] [phone] Fax [phone]

## 2015-03-20 ENCOUNTER — Other Ambulatory Visit: Payer: Self-pay | Admitting: Family Medicine

## 2015-03-20 ENCOUNTER — Other Ambulatory Visit: Payer: Self-pay | Admitting: Infectious Diseases

## 2015-03-20 ENCOUNTER — Encounter: Payer: Self-pay | Admitting: Family Medicine

## 2015-03-20 DIAGNOSIS — B20 Human immunodeficiency virus [HIV] disease: Secondary | ICD-10-CM

## 2015-03-21 MED ORDER — DARUNAVIR ETHANOLATE 800 MG PO TABS: 800.0000 mg | ORAL_TABLET | Freq: Every day | ORAL | Status: DC

## 2015-03-22 ENCOUNTER — Other Ambulatory Visit: Payer: Self-pay | Admitting: Family Medicine

## 2015-03-22 MED ORDER — AMLODIPINE BESYLATE 10 MG PO TABS: 10.0000 mg | ORAL_TABLET | Freq: Every day | ORAL | Status: DC

## 2015-03-22 MED ORDER — METOLAZONE 2.5 MG PO TABS: ORAL_TABLET | ORAL | Status: DC

## 2015-03-29 ENCOUNTER — Encounter: Payer: Self-pay | Admitting: Endocrinology

## 2015-03-29 ENCOUNTER — Encounter: Payer: Medicare Other | Attending: Endocrinology | Admitting: Dietician

## 2015-03-29 ENCOUNTER — Ambulatory Visit (INDEPENDENT_AMBULATORY_CARE_PROVIDER_SITE_OTHER): Payer: Medicare Other | Admitting: Endocrinology

## 2015-03-29 ENCOUNTER — Encounter: Payer: Self-pay | Admitting: Dietician

## 2015-03-29 VITALS — Ht 63.0 in | Wt 133.0 lb

## 2015-03-29 VITALS — BP 106/60 | HR 70 | Temp 97.7°F | Resp 14 | Ht 63.0 in | Wt 133.0 lb

## 2015-03-29 DIAGNOSIS — E1165 Type 2 diabetes mellitus with hyperglycemia: Secondary | ICD-10-CM | POA: Diagnosis not present

## 2015-03-29 DIAGNOSIS — IMO0002 Reserved for concepts with insufficient information to code with codable children: Secondary | ICD-10-CM

## 2015-03-29 DIAGNOSIS — E1139 Type 2 diabetes mellitus with other diabetic ophthalmic complication: Secondary | ICD-10-CM | POA: Diagnosis not present

## 2015-03-29 DIAGNOSIS — Z713 Dietary counseling and surveillance: Secondary | ICD-10-CM | POA: Diagnosis not present

## 2015-03-29 MED ORDER — REPAGLINIDE 2 MG PO TABS: ORAL_TABLET | ORAL | Status: DC

## 2015-03-29 NOTE — Patient Instructions (Addendum)
Check blood sugars on waking up .Marland Kitchen 2-3 .Marland Kitchen times a week Also check blood sugars about 2 hours after a meal and do this after different meals by rotation  Recommended blood sugar levels on waking up is 90-130 and about 2 hours after meal is 140-180 Please bring blood sugar monitor to each visit.  Change cereal to egg/toast etc  Prandin 2 before bfst, 1 before lunch and 2 before supper

## 2015-03-29 NOTE — Patient Instructions (Signed)
Begin the exercise habit. Aim for 30 minutes per day most days of the week.  (walking, yard work) Aim for 3-4 Carb Choices per meal (45-60 grams) +/- 1 either way  Aim for 0-2 Carbs per snack if hungry  Include protein in moderation with your meals and snacks Consider reading food labels for Total Carbohydrate and Fat Grams of foods Consider checking BG at alternate times per day as directed by MD  Consider comparing pre meal and post meal blood sugar values. Consider taking medication as directed by MD

## 2015-03-29 NOTE — Progress Notes (Signed)
Patient ID: [name], male   DOB: November 11, 1945, 69 y.o.   MRN: [id number]           Reason for Appointment: Consultation for Type 2 Diabetes  Referring physician: Charlett Blake  History of Present Illness:          Date of diagnosis of type 2 diabetes mellitus: 2013        Background history:  He only mildly increased blood sugar levels at the time of diagnosis and A1c was 7.1  He had been treated initially with metformin but this was stopped subsequently when his renal function was worse  He was subsequently treated with glipizide but his blood sugars have been poorly controlled in 2016 with glipizide alone He has not been treated with any medications until recently  Recent history:   Since his A1c has been persistently over 9% he was started on Januvia 50 mg in 01/2015 Prior to this he thinks his blood sugars were in the 200-300 range and he was having symptoms of blurred vision and some fatigue His glipizide dose had been increased However because of tendency to low normal readings fasting and relatively high readings after meals and continued high A1c of 9.5 he was switched from glipizide to Prandin in 02/2015 He thinks he has taken this fairly regularly before each meal, now taking 2 mg daily.  Current blood sugar patterns and problems identified:  Was having fairly good fasting readings but they have been relatively higher this week  He has checked post prandial readings only after breakfast and none after lunch or dinner  Most of his readings after breakfast are over 200 except once but he is generally eating cereal in the morning  He has not been given any diabetes education but he thinks he is trying to eat healthy meals within his financial limitations    No recent objective evaluation of his diabetes available   Oral hypoglycemic drugs the patient is taking are: Prandin 2 mg before meals 3 times a day    Side effects from medications have been: None  Compliance with the  medical regimen: Good  Hypoglycemia: None    Glucose monitoring:  done usually 1-2  times a day         Glucometer:  FreeStyle     Blood Glucose readings by meter download  Mean values apply above for all meters except median for One Touch  PRE-MEAL Fasting Lunch  4 PM  Bedtime Overall  Glucose range:  107-155   101-236   229     Mean/median:                   Dietician visit, most recent: none               Exercise:  walking 2/7 days a week, about 1 mile  Weight history:   Wt Readings from Last 3 Encounters:  03/29/15 133 lb (60.328 kg)  03/29/15 133 lb (60.328 kg)  03/15/15 130 lb (58.968 kg)    Glycemic control:   Lab Results  Component Value Date   HGBA1C 9.5* 01/19/2015   HGBA1C 9.4* 10/10/2014   HGBA1C 6.5 06/23/2014   Lab Results  Component Value Date   MICROALBUR 5.7* 01/19/2015   LDLCALC 84 06/23/2014   CREATININE 1.78* 02/06/2015         Medication List       This list is accurate as of: 03/29/15 11:59 PM.  Always use your most recent med  list.               amLODipine 10 MG tablet  Commonly known as:  NORVASC  Take 1 tablet (10 mg total) by mouth at bedtime.     aspirin 81 MG chewable tablet  Chew 81 mg by mouth daily.     atorvastatin 10 MG tablet  Commonly known as:  LIPITOR  TAKE 1 TABLET BY MOUTH DAILY     b complex vitamins tablet  Take 1 tablet by mouth daily.     Cholecalciferol 2000 UNITS Caps  Take by mouth daily.     clotrimazole-betamethasone cream  Commonly known as:  LOTRISONE  Apply 1 application topically 2 (two) times daily. apply to lesion on chest wall     Darunavir Ethanolate 800 MG tablet  Commonly known as:  PREZISTA  Take 1 tablet (800 mg total) by mouth daily.     dolutegravir 50 MG tablet  Commonly known as:  TIVICAY  Take 1 tablet (50 mg total) by mouth daily.     freestyle lancets  Use to check blood sugars 3 times daily.     furosemide 20 MG tablet  Commonly known as:  LASIX  TAKE 2 TABLETS BY  MOUTH DAILY AS NEEDED FOR FLUID OR EDEMA     glucose blood test strip  Commonly known as:  FREESTYLE TEST STRIPS  USE TO CHECK BLOOD SUGAR once daily     KRILL OIL OMEGA-3 PO  Take 1 capsule by mouth daily.     lisinopril 5 MG tablet  Commonly known as:  PRINIVIL,ZESTRIL  Take 1/2 by mouth daily     metolazone 2.5 MG tablet  Commonly known as:  ZAROXOLYN  TAKE 1 TABLET BY MOUTH EVERY DAY 20 MINUTES PRIOR TO LASIX     NORVIR 100 MG Tabs tablet  Generic drug:  ritonavir  TAKE 1 TABLET BY MOUTH EVERY DAY     omeprazole 20 MG capsule  Commonly known as:  PRILOSEC  Take 1 capsule (20 mg total) by mouth daily. Take 12 hours apart from Edurant dose.     potassium chloride SA 20 MEQ tablet  Commonly known as:  K-DUR,KLOR-CON  Take 1 tablet (20 mEq total) by mouth 2 (two) times daily.     PROBIOTIC DAILY PO  Take 1 tablet by mouth daily.     repaglinide 2 MG tablet  Commonly known as:  PRANDIN  2 tablets before breakfast, 1 before lunch and 2 before supper     rilpivirine 25 MG Tabs tablet  Commonly known as:  EDURANT  Take 1 tablet (25 mg total) by mouth daily with breakfast.     sitaGLIPtin 50 MG tablet  Commonly known as:  JANUVIA  Take 1 tablet (50 mg total) by mouth daily.     sodium bicarbonate 650 MG tablet  Take 650 mg by mouth 3 (three) times daily.     valACYclovir 500 MG tablet  Commonly known as:  VALTREX  TAKE 1 TABLET BY MOUTH TWICE DAILY        Allergies: No Known Allergies  Past Medical History  Diagnosis Date  . Arthritis   . Diabetes mellitus   . History of depression   . GERD (gastroesophageal reflux disease)   . CAD (coronary artery disease)     2003- cabg  . Hypertension   . Hyperlipidemia   . History of kidney stones   . COPD (chronic obstructive pulmonary disease)   . HIV (human immunodeficiency  virus infection) 1991    on meds since initial dx.   . Dermatitis 11/27/2012  . Nocturia 03/06/2013  . Epicondylitis 06/05/2013    right  .  Pancreatitis 11/2013    attributed to HIV meds.   . Constipation 05/28/2014  . Pain in joint, ankle and foot 01/19/2015  . Chronic kidney disease     Past Surgical History  Procedure Laterality Date  . Coronary artery bypass graft  05/2002  . Vasectomy      Family History  Problem Relation Age of Onset  . Arthritis      mother/father/paternal grandparents  . Breast cancer Maternal Aunt     paternal aunt  . Lung cancer Maternal Aunt   . Hyperlipidemia Mother   . Hyperlipidemia Father   . Heart disease      parents/maternal grandparents/ 2 brothers  . Stroke Paternal Grandmother   . Hypertension Father     paternal grandmother/3 brothers/1 sister  . Mental retardation Sister   . Diabetes Mother     paternal grandparents/1 brother    Social History:  reports that he has never smoked. He has never used smokeless tobacco. He reports that he does not drink alcohol or use illicit drugs.    Review of Systems    Lipid history: On Lipitor 10 mg only, previously had been on gemfibrozil and fenofibrate also    Lab Results  Component Value Date   CHOL 157 01/19/2015   HDL 33.40* 01/19/2015   LDLCALC 84 06/23/2014   LDLDIRECT 80.0 01/19/2015   TRIG 269.0* 01/19/2015   CHOLHDL 5 01/19/2015           Most recent eye exam was nearly a year ago in 2015, reportedly normal   RENAL dysfunction: Followed by nephrologist, etiology unclear  Lab Results  Component Value Date   CREATININE 1.78* 02/06/2015   BUN 36* 02/06/2015   NA 138 02/06/2015   K 3.6 02/06/2015   CL 102 02/06/2015   CO2 25 02/06/2015     Hypertension: Has been treated with various drugs in the past, now taking only amlodipine and low-dose lisinopril   LABS:  No visits with results within 1 Week(s) from this visit. Latest known visit with results is:  Lab on 02/06/2015  Component Date Value Ref Range Status  . Sodium 02/06/2015 138  135 - 145 mEq/L Final  . Potassium 02/06/2015 3.6  3.5 - 5.1 mEq/L  Final  . Chloride 02/06/2015 102  96 - 112 mEq/L Final  . CO2 02/06/2015 25  19 - 32 mEq/L Final  . Calcium 02/06/2015 8.9  8.4 - 10.5 mg/dL Final  . Albumin 02/06/2015 3.6  3.5 - 5.2 g/dL Final  . BUN 02/06/2015 36* 6 - 23 mg/dL Final  . Creatinine, Ser 02/06/2015 1.78* 0.40 - 1.50 mg/dL Final  . Glucose, Bld 02/06/2015 254* 70 - 99 mg/dL Final  . Phosphorus 02/06/2015 2.8  2.3 - 4.6 mg/dL Final  . GFR 02/06/2015 40.76* >60.00 mL/min Final    Physical Examination:  BP 106/60 mmHg  Pulse 70  Temp(Src) 97.7 F (36.5 C)  Resp 14  Ht 5\' 3"  (1.6 m)  Wt 133 lb (60.328 kg)  BMI 23.57 kg/m2  SpO2 97%             Diabetic foot exam  in 7/16 shows normal monofilament sensation in the toes and plantar surfaces, no skin lesions or ulcers on the feet and practically absent pedal pulses   ASSESSMENT:  Diabetes type  2, uncontrolled with BMI 23 but has some abdominal obesity    He  still has relatively high blood sugars and not responding well to Prandin However his blood sugars are checked mostly after breakfast when he is eating cereal  His fasting readings are not as low compared to when he was taking glipizide but has a couple of high readings in the morning around 150 recently Has no readings after lunch or dinner but today after lunch in the office his glucose is 170  Discussed with patient that he needs to stop eating cereal and have a balanced meal with protein, discussed options   PLAN:   Change Prandin dose to 4 mg at breakfast and supper and stay on 2 milligrams at lunch  He will be scheduled to see the dietitian for meal planning  Encouraged him to be as active as possible  Continue Januvia for now  If blood sugars are not improved on the next visit will change Januvia to Victoza, will also check fructosamine   Patient Instructions  Check blood sugars on waking up .Marland Kitchen 2-3 .Marland Kitchen times a week Also check blood sugars about 2 hours after a meal and do this after different  meals by rotation  Recommended blood sugar levels on waking up is 90-130 and about 2 hours after meal is 140-180 Please bring blood sugar monitor to each visit.  Change cereal to egg/toast etc  Prandin 2 before bfst, 1 before lunch and 2 before supper    Leshay Desaulniers 03/30/2015, 3:10 PM   Note: This office note was prepared with Estate agent. Any transcriptional errors that result from this process are unintentional.

## 2015-03-30 NOTE — Progress Notes (Signed)
Diabetes Self-Management Education  Visit Type: First/Initial  Appt. Start Time: 1345 Appt. End Time: 1500  03/30/2015  [name], identified by name and date of birth, is a 69 y.o. male with a diagnosis of Diabetes: Type 2.   Patient lives alone and does his own shopping and cooking.  His neighbors occasionally bring him a meal but it is usually fried.  He goes to a community meal once a month at his church and relies some on [employer] bank.  Hx includes Type 2 diabetes for about 3 years, Gerd, HTN, HLD, CKD stage 3, HIV, COPD.  He maintains a UBW of 129-132 lbs.    ASSESSMENT  Height 5\' 3"  (1.6 m), weight 133 lb (60.328 kg). Body mass index is 23.57 kg/(m^2).      Diabetes Self-Management Education - 03/30/15 1313    Patient Education   Disease state  Definition of diabetes, type 1 and 2, and the diagnosis of diabetes   Nutrition management  Role of diet in the treatment of diabetes and the relationship between the three main macronutrients and blood glucose level;Food label reading, portion sizes and measuring food.;Carbohydrate counting   Chronic complications Relationship between chronic complications and blood glucose control;Retinopathy and reason for yearly dilated eye exams   Psychosocial adjustment Role of stress on diabetes   Individualized Goals (developed by patient)   Nutrition Follow meal plan discussed;General guidelines for healthy choices and portions discussed   Physical Activity Exercise 3-5 times per week   Outcomes   Expected Outcomes Demonstrated interest in learning. Expect positive outcomes      Individualized Plan for Diabetes Self-Management Training:   Learning Objective:  Patient will have a greater understanding of diabetes self-management. Patient education plan is to attend individual and/or group sessions per assessed needs and concerns.   Plan:   Patient Instructions  Begin the exercise habit. Aim for 30 minutes per day most days  of the week.  (walking, yard work) Aim for 3-4 Carb Choices per meal (45-60 grams) +/- 1 either way  Aim for 0-2 Carbs per snack if hungry  Include protein in moderation with your meals and snacks Consider reading food labels for Total Carbohydrate and Fat Grams of foods Consider checking BG at alternate times per day as directed by MD  Consider comparing pre meal and post meal blood sugar values. Consider taking medication as directed by MD    Expected Outcomes:  Demonstrated interest in learning. Expect positive outcomes  Education material provided: Living Well with Diabetes, Meal plan card, My Plate and Snack sheet  If problems or questions, patient to contact team via:  Phone and Email  Future DSME appointment:

## 2015-04-09 ENCOUNTER — Other Ambulatory Visit: Payer: Self-pay | Admitting: Family Medicine

## 2015-04-10 ENCOUNTER — Other Ambulatory Visit: Payer: Medicare Other

## 2015-04-10 DIAGNOSIS — B20 Human immunodeficiency virus [HIV] disease: Secondary | ICD-10-CM | POA: Diagnosis not present

## 2015-04-10 LAB — COMPREHENSIVE METABOLIC PANEL
ALBUMIN: 4.1 g/dL (ref 3.6–5.1)
ALT: 19 U/L (ref 9–46)
AST: 17 U/L (ref 10–35)
Alkaline Phosphatase: 63 U/L (ref 40–115)
BUN: 31 mg/dL — ABNORMAL HIGH (ref 7–25)
CALCIUM: 10.1 mg/dL (ref 8.6–10.3)
CHLORIDE: 101 mmol/L (ref 98–110)
CO2: 26 mmol/L (ref 20–31)
Creat: 1.66 mg/dL — ABNORMAL HIGH (ref 0.70–1.25)
Glucose, Bld: 150 mg/dL — ABNORMAL HIGH (ref 65–99)
POTASSIUM: 4.1 mmol/L (ref 3.5–5.3)
Sodium: 140 mmol/L (ref 135–146)
TOTAL PROTEIN: 7.2 g/dL (ref 6.1–8.1)
Total Bilirubin: 0.5 mg/dL (ref 0.2–1.2)

## 2015-04-11 LAB — HIV-1 RNA QUANT-NO REFLEX-BLD
HIV 1 RNA Quant: <20 copies/mL (ref <20)
HIV-1 RNA Quant, Log: <1.3 {Log} (ref <1.30)

## 2015-04-11 LAB — T-HELPER CELL (CD4) - (RCID CLINIC ONLY)
CD4 % Helper T Cell: 44 % (ref 33–55)
CD4 T Cell Abs: 400 /uL (ref 400–2700)

## 2015-04-12 ENCOUNTER — Other Ambulatory Visit: Payer: Self-pay

## 2015-04-12 ENCOUNTER — Ambulatory Visit: Payer: Self-pay

## 2015-04-12 NOTE — Patient Outreach (Signed)
[facility]) Care Management  [place]  04/12/2015   JONUEL BUTTERFIELD 11/14/1945 [id number]  Subjective: Patient reports [facility], and that he saw Dr. Dwyane Dee last week.  States he is trying to make healthy diet choices.  Objective: Patient reports fasting cbg was 117 yesterday, and that cbg readings taken 2 hours after eating have been between 151 and 230.  Current Medications:  Current Outpatient Prescriptions  Medication Sig Dispense Refill  . amLODipine (NORVASC) 10 MG tablet Take 1 tablet (10 mg total) by mouth at bedtime. 90 tablet 1  . aspirin 81 MG chewable tablet Chew 81 mg by mouth daily.    Marland Kitchen atorvastatin (LIPITOR) 10 MG tablet TAKE 1 TABLET BY MOUTH DAILY 30 tablet 6  . b complex vitamins tablet Take 1 tablet by mouth daily.    . Cholecalciferol 2000 UNITS CAPS Take by mouth daily.    . clotrimazole-betamethasone (LOTRISONE) cream Apply 1 application topically 2 (two) times daily. apply to lesion on chest wall 45 g 1  . Darunavir Ethanolate (PREZISTA) 800 MG tablet Take 1 tablet (800 mg total) by mouth daily. 30 tablet 6  . dolutegravir (TIVICAY) 50 MG tablet Take 1 tablet (50 mg total) by mouth daily. 30 tablet 11  . furosemide (LASIX) 20 MG tablet TAKE 2 TABLETS BY MOUTH DAILY AS NEEDED FOR FLUID OR EDEMA 30 tablet 0  . glucose blood (FREESTYLE TEST STRIPS) test strip USE TO CHECK BLOOD SUGAR once daily 50 each 5  . KRILL OIL OMEGA-3 PO Take 1 capsule by mouth daily.    . Lancets (FREESTYLE) lancets Use to check blood sugars 3 times daily. 100 each 5  . lisinopril (PRINIVIL,ZESTRIL) 5 MG tablet Take 1/2 by mouth daily (Patient taking differently: Take 5 mg by mouth every other day. Take 1/2 by mouth daily) 15 tablet 3  . metolazone (ZAROXOLYN) 2.5 MG tablet TAKE 1 TABLET BY MOUTH EVERY DAY 20 MINUTES PRIOR TO LASIX 30 tablet 6  . NORVIR 100 MG TABS tablet TAKE 1 TABLET BY MOUTH EVERY DAY 30  tablet 6  . omeprazole (PRILOSEC) 20 MG capsule Take 1 capsule (20 mg total) by mouth daily. Take 12 hours apart from Edurant dose. 30 capsule 5  . potassium chloride SA (K-DUR,KLOR-CON) 20 MEQ tablet Take 1 tablet (20 mEq total) by mouth 2 (two) times daily. 60 tablet 6  . Probiotic Product (PROBIOTIC DAILY PO) Take 1 tablet by mouth daily.     . repaglinide (PRANDIN) 2 MG tablet 2 tablets before breakfast, 1 before lunch and 2 before supper 150 tablet 2  . rilpivirine (EDURANT) 25 MG TABS tablet Take 1 tablet (25 mg total) by mouth daily with breakfast. 30 tablet 11  . sitaGLIPtin (JANUVIA) 50 MG tablet Take 1 tablet (50 mg total) by mouth daily. 30 tablet 3  . sodium bicarbonate 650 MG tablet Take 650 mg by mouth 3 (three) times daily.    . valACYclovir (VALTREX) 500 MG tablet TAKE 1 TABLET BY MOUTH TWICE DAILY (Patient taking differently: TAKE 1 TABLET BY MOUTH  DAILY) 60 tablet 5   No current facility-administered medications for this visit.    Functional Status:  In your present state of health, do you have any difficulty performing the following activities: 04/12/2015 03/15/2015  Hearing? [place]? N (No Data)  Difficulty concentrating or making decisions? N -  Walking or climbing stairs? Y (No Data)  Dressing or bathing? N -  Doing errands, shopping? N N  Preparing Food and eating ? N -  Using the Toilet? N -  In the past six months, have you accidently leaked urine? N -  Do you have problems with loss of bowel control? N -  Managing your Medications? N -  Managing your Finances? N -  Housekeeping or managing your Housekeeping? N -    Fall/Depression Screening: PHQ 2/9 Scores 03/29/2015 03/15/2015 10/05/2014 06/05/2014 02/14/2014 12/15/2013 07/26/2013  PHQ - 2 Score 0 0 0 0 0 0 0   THN CM Care Plan Problem One        Most Recent Value   Care Plan Problem One  Elevated A1C, measured at over 9 on 02-28-15   Role Documenting the Problem One  [place] for Problem One   Active   THN Long Term Goal (31-90 days)  A1C will decrease by 2 points within 90 days.   THN Long Term Goal Start Date  03/15/15   Interventions for Problem One Long Term Goal  -- [Patient did go for diabetic class.  Education reinforced.]   THN CM Short Term Goal #1 (0-30 days)  Improve average fasting blood sugars to 100 or less within 30 days.   THN CM Short Term Goal #1 Start Date  03/15/15   Interventions for Short Term Goal #1  Reinforced education re monitoring blood sugars, normal ranges, role of diet choices on improving management.   THN CM Short Term Goal #2 (0-30 days)  Patient will increase activity levels by walking 3-4 times per week within 30 days.   THN CM Short Term Goal #2 Start Date  03/15/15   Interventions for Short Term Goal #2  Is walking about 3 times a week.  Encouraged to increase to 4 times a week.  Education provided on other ways to increase activity.      Fall Risk  04/12/2015 03/29/2015 03/15/2015 10/05/2014 06/05/2014  Falls in the past year? No No No No No  Risk for fall due to : - - - - -  Risk for fall due to (comments): - - - - -   Assessment: Patient kept appointments last week with endocrinologist and the diabetic center.  He expresses                    sincerity about improving his management of diabetes with improving his diet and increasing his                   activity level.  Plan: Patient will keep scheduled appointments this month with PCP, Dr. Dwyane Dee, and the [facility].           Patient will review educational materials provided on diabetic diet guidelines.           Patient will review Advanced Directive materials mailed to his last month and request assistance                      in completing them if needed.            Patient will increase activity level by walking at least 4 times a week.            Health Coach will follow-up within one month for education and support to improve his  self-management of  Diabetes.  Candie Mile, RN, MSN [place] [phone] Fax [phone]

## 2015-04-19 ENCOUNTER — Other Ambulatory Visit: Payer: Self-pay | Admitting: Family Medicine

## 2015-04-19 ENCOUNTER — Other Ambulatory Visit: Payer: Self-pay | Admitting: Infectious Diseases

## 2015-04-20 ENCOUNTER — Other Ambulatory Visit: Payer: Self-pay | Admitting: *Deleted

## 2015-04-20 DIAGNOSIS — B20 Human immunodeficiency virus [HIV] disease: Secondary | ICD-10-CM

## 2015-04-20 MED ORDER — DARUNAVIR ETHANOLATE 800 MG PO TABS
800.0000 mg | ORAL_TABLET | Freq: Every day | ORAL | Status: DC
Start: 2015-04-20 — End: 2015-10-19

## 2015-04-20 NOTE — Telephone Encounter (Signed)
Called pharmacy has refills on file medication not filled

## 2015-04-24 ENCOUNTER — Encounter: Payer: Self-pay | Admitting: Internal Medicine

## 2015-04-24 ENCOUNTER — Ambulatory Visit (INDEPENDENT_AMBULATORY_CARE_PROVIDER_SITE_OTHER): Payer: Medicare Other | Admitting: Internal Medicine

## 2015-04-24 VITALS — BP 131/68 | HR 74 | Temp 97.6°F | Ht 63.0 in | Wt 131.0 lb

## 2015-04-24 DIAGNOSIS — Z113 Encounter for screening for infections with a predominantly sexual mode of transmission: Secondary | ICD-10-CM | POA: Diagnosis not present

## 2015-04-24 DIAGNOSIS — B20 Human immunodeficiency virus [HIV] disease: Secondary | ICD-10-CM

## 2015-04-24 DIAGNOSIS — Z23 Encounter for immunization: Secondary | ICD-10-CM

## 2015-04-24 DIAGNOSIS — Z79899 Other long term (current) drug therapy: Secondary | ICD-10-CM

## 2015-04-24 NOTE — Addendum Note (Signed)
Addended by: Landis Gandy on: 04/24/2015 04:33 PM   Modules accepted: Orders

## 2015-04-24 NOTE — Assessment & Plan Note (Signed)
His HIV infection remains under excellent control. I will continue his current anti-retroviral regimen and see him back after blood work in 6 months. He will get his flu shot and pneumococcal vaccine booster today.

## 2015-04-24 NOTE — Progress Notes (Signed)
Patient ID: [name], male   DOB: 07/07/1946, 69 y.o.   MRN: [id number]          Patient Active Problem List   Diagnosis Date Noted  . DM (diabetes mellitus), type 2 with ophthalmic complications 57/84/6962    Priority: High  . Human immunodeficiency virus (HIV) disease 05/12/2006    Priority: High  . Coronary atherosclerosis 05/12/2006    Priority: High  . URI (upper respiratory infection) 02/02/2015  . Capsulitis of foot 01/24/2015  . Metatarsal deformity 01/24/2015  . Keratoma 01/24/2015  . Pain in joint, ankle and foot 01/19/2015  . Skin lesion 10/18/2014  . Scratch 09/27/2014  . Absolute anemia 05/28/2014  . Constipation 05/28/2014  . Pedal edema 05/21/2014  . Acute respiratory failure 05/03/2014  . Posterior cervical lymphadenopathy 03/30/2014  . AKI (acute kidney injury) 03/23/2014  . Metabolic acidosis 95/28/4132  . Hyperkalemia 03/23/2014  . Hyponatremia 03/23/2014  . Protein-calorie malnutrition, severe 03/23/2014  . Diarrhea 11/20/2013  . HLD (hyperlipidemia) 11/19/2013  . Ascites 11/18/2013  . Pancreatitis 11/15/2013  . Epicondylitis 06/05/2013  . Nocturia 03/06/2013  . Inguinal adenopathy 01/11/2013  . Dermatitis 11/27/2012  . Night sweats 07/06/2012  . Erectile dysfunction 01/01/2012  . Lipodystrophy 12/20/2010  . Dry eye syndrome 12/20/2010  . BELLS PALSY 07/19/2010  . GENITAL HERPES 05/03/2009  . SHINGLES, HX OF 05/03/2009  . WEIGHT LOSS, ABNORMAL 04/03/2009  . INGUINAL LYMPHADENOPATHY, RIGHT 04/03/2009  . MEMORY LOSS 09/18/2008  . PERIPHERAL VASCULAR DISEASE 07/20/2008  . COPD 07/20/2008  . HIP PAIN, BILATERAL 07/17/2008  . KNEE PAIN, BILATERAL 07/17/2008  . HEMATOCHEZIA 04/18/2008  . NEPHROLITHIASIS, HX OF 02/22/2008  . DEPRESSION 09/03/2006  . GERD 09/03/2006  . CORONARY ARTERY BYPASS GRAFT, HX OF 09/03/2006  . HEARING LOSS, SENSORINEURAL 05/12/2006  . Essential hypertension 05/12/2006    Patient's Medications  New Prescriptions     No medications on file  Previous Medications   AMLODIPINE (NORVASC) 10 MG TABLET    Take 1 tablet (10 mg total) by mouth at bedtime.   ASPIRIN 81 MG CHEWABLE TABLET    Chew 81 mg by mouth daily.   ATORVASTATIN (LIPITOR) 10 MG TABLET    TAKE 1 TABLET BY MOUTH DAILY   B COMPLEX VITAMINS TABLET    Take 1 tablet by mouth daily.   CHOLECALCIFEROL 2000 UNITS CAPS    Take by mouth daily.   CLOTRIMAZOLE-BETAMETHASONE (LOTRISONE) CREAM    Apply 1 application topically 2 (two) times daily. apply to lesion on chest wall   DARUNAVIR ETHANOLATE (PREZISTA) 800 MG TABLET    Take 1 tablet (800 mg total) by mouth daily.   DOLUTEGRAVIR (TIVICAY) 50 MG TABLET    Take 1 tablet (50 mg total) by mouth daily.   FUROSEMIDE (LASIX) 20 MG TABLET    TAKE 2 TABLETS BY MOUTH DAILY AS NEEDED FOR FLUID OR EDEMA   GLUCOSE BLOOD (FREESTYLE TEST STRIPS) TEST STRIP    USE TO CHECK BLOOD SUGAR once daily   KRILL OIL OMEGA-3 PO    Take 1 capsule by mouth daily.   LANCETS (FREESTYLE) LANCETS    Use to check blood sugars 3 times daily.   LISINOPRIL (PRINIVIL,ZESTRIL) 5 MG TABLET    Take 1/2 by mouth daily   METOLAZONE (ZAROXOLYN) 2.5 MG TABLET    TAKE 1 TABLET BY MOUTH EVERY DAY 20 MINUTES PRIOR TO LASIX   NORVIR 100 MG TABS TABLET    TAKE 1 TABLET BY MOUTH EVERY DAY  OMEPRAZOLE (PRILOSEC) 20 MG CAPSULE    Take 1 capsule (20 mg total) by mouth daily. Take 12 hours apart from Edurant dose.   POTASSIUM CHLORIDE SA (K-DUR,KLOR-CON) 20 MEQ TABLET    Take 1 tablet (20 mEq total) by mouth 2 (two) times daily.   PROBIOTIC PRODUCT (PROBIOTIC DAILY PO)    Take 1 tablet by mouth daily.    REPAGLINIDE (PRANDIN) 2 MG TABLET    2 tablets before breakfast, 1 before lunch and 2 before supper   RILPIVIRINE (EDURANT) 25 MG TABS TABLET    Take 1 tablet (25 mg total) by mouth daily with breakfast.   SITAGLIPTIN (JANUVIA) 50 MG TABLET    Take 1 tablet (50 mg total) by mouth daily.   SODIUM BICARBONATE 650 MG TABLET    Take 650 mg by mouth 3  (three) times daily.   VALACYCLOVIR (VALTREX) 500 MG TABLET    TAKE 1 TABLET BY MOUTH TWICE DAILY  Modified Medications   No medications on file  Discontinued Medications   No medications on file    Subjective: [name] is in for his routine HIV follow-up visit. He has not missed any doses of his [name], Tivicay, Prezista were Norvir. He takes it each morning around 6 to 7 AM with breakfast. He babysits his granddaughter every other week. He is doing well without complaints. He's had no problems with abdominal pain, nausea or vomiting.  Review of Systems: Pertinent items are noted in HPI.  Past Medical History  Diagnosis Date  . Arthritis   . Diabetes mellitus   . History of depression   . GERD (gastroesophageal reflux disease)   . CAD (coronary artery disease)     2003- cabg  . Hypertension   . Hyperlipidemia   . History of kidney stones   . COPD (chronic obstructive pulmonary disease)   . HIV (human immunodeficiency virus infection) 1991    on meds since initial dx.   . Dermatitis 11/27/2012  . Nocturia 03/06/2013  . Epicondylitis 06/05/2013    right  . Pancreatitis 11/2013    attributed to HIV meds.   . Constipation 05/28/2014  . Pain in joint, ankle and foot 01/19/2015  . Chronic kidney disease     Social History  Substance Use Topics  . Smoking status: Never Smoker   . Smokeless tobacco: Never Used  . Alcohol Use: No    Family History  Problem Relation Age of Onset  . Arthritis      mother/father/paternal grandparents  . Breast cancer Maternal Aunt     paternal aunt  . Lung cancer Maternal Aunt   . Hyperlipidemia Mother   . Hyperlipidemia Father   . Heart disease      parents/maternal grandparents/ 2 brothers  . Stroke Paternal Grandmother   . Hypertension Father     paternal grandmother/3 brothers/1 sister  . Mental retardation Sister   . Diabetes Mother     paternal grandparents/1 brother    No Known Allergies  Objective:  Filed Vitals:   04/24/15  0844  BP: 131/68  Pulse: 74  Temp: 97.6 F (36.4 C)  TempSrc: Oral  Height: 5\' 3"  (1.6 m)  Weight: 131 lb (59.421 kg)   Body mass index is 23.21 kg/(m^2).  General: He is smiling and in good spirits Oral: No oropharyngeal lesions Skin: No rash Lungs: Clear Cor: Regular S1 and S2 with no murmurs Abdomen: Soft and nontender  Lab Results Lab Results  Component Value Date   WBC 5.6  01/19/2015   HGB 12.3* 01/19/2015   HCT 36.1* 01/19/2015   MCV 90.3 01/19/2015   PLT 214.0 01/19/2015    Lab Results  Component Value Date   CREATININE 1.66* 04/10/2015   BUN 31* 04/10/2015   NA 140 04/10/2015   K 4.1 04/10/2015   CL 101 04/10/2015   CO2 26 04/10/2015    Lab Results  Component Value Date   ALT 19 04/10/2015   AST 17 04/10/2015   ALKPHOS 63 04/10/2015   BILITOT 0.5 04/10/2015    Lab Results  Component Value Date   CHOL 157 01/19/2015   HDL 33.40* 01/19/2015   LDLCALC 84 06/23/2014   LDLDIRECT 80.0 01/19/2015   TRIG 269.0* 01/19/2015   CHOLHDL 5 01/19/2015    Lab Results HIV 1 RNA QUANT (copies/mL)  Date Value  04/10/2015 <20  09/04/2014 <20  05/24/2014 <20   HIV-1 RNA VIRAL LOAD (no units)  Date Value  07/14/2013 <40  04/13/2013 <40  01/18/2013 <40   CD4 (no units)  Date Value  07/14/2013 514  04/13/2013 498  01/18/2013 522   CD4 T CELL ABS (/uL)  Date Value  04/10/2015 400  09/04/2014 320*  05/24/2014 300*     Problem List Items Addressed This Visit      High   Human immunodeficiency virus (HIV) disease    His HIV infection remains under excellent control. I will continue his current anti-retroviral regimen and see him back after blood work in 6 months. He will get his flu shot and pneumococcal vaccine booster today.      Relevant Orders   T-helper cell (CD4)- ([facility] only)   HIV 1 RNA quant-no reflex-bld   CBC   Comprehensive metabolic panel   Lipid panel   RPR    Other Visit Diagnoses    Encounter for long-term (current) use  of medications    -  Primary    Relevant Orders    Lipid panel         Michel Bickers, MD [facility] [place] [phone] pager   [phone] cell 04/24/2015, 9:02 AM

## 2015-04-26 ENCOUNTER — Ambulatory Visit (INDEPENDENT_AMBULATORY_CARE_PROVIDER_SITE_OTHER): Payer: Medicare Other | Admitting: Endocrinology

## 2015-04-26 ENCOUNTER — Encounter: Payer: Self-pay | Admitting: Endocrinology

## 2015-04-26 VITALS — BP 112/60 | HR 77 | Temp 98.1°F | Resp 14 | Ht 63.0 in | Wt 132.6 lb

## 2015-04-26 DIAGNOSIS — IMO0002 Reserved for concepts with insufficient information to code with codable children: Secondary | ICD-10-CM

## 2015-04-26 DIAGNOSIS — E1165 Type 2 diabetes mellitus with hyperglycemia: Secondary | ICD-10-CM | POA: Diagnosis not present

## 2015-04-26 LAB — POCT GLYCOSYLATED HEMOGLOBIN (HGB A1C): Hemoglobin A1C: 6.8

## 2015-04-26 NOTE — Progress Notes (Signed)
Patient ID: [name], male   DOB: Oct 17, 1945, 69 y.o.   MRN: [id number]           Reason for Appointment: for Type 2 Diabetes  Referring physician: Charlett Blake  History of Present Illness:          Date of diagnosis of type 2 diabetes mellitus: 2013        Background history:  He only mildly increased blood sugar levels at the time of diagnosis and A1c was 7.1  He had been treated initially with metformin but this was stopped subsequently when his renal function was worse  He was subsequently treated with glipizide but his blood sugars have been poorly controlled in 2016 with glipizide alone He has not been treated with any medications until recently  Recent history:   Since his A1c has been persistently over 9% he was started on Januvia 50 mg in 01/2015 Prior to this he thinks his blood sugars were in the 200-300 range and he was having symptoms of blurred vision and some fatigue With continued high A1c of 9.5 he was switched from glipizide to Prandin in 02/2015 He thinks he has taken this fairly regularly before each meal, now taking 4 mg before breakfast and supper and 2 mg before lunch. He was also referred to the dietitian and has started making changes  His A1c is now back down to 6.8%  Current blood sugar patterns and problems identified:  He did not bring his blood sugar monitor and not clear what his readings are, he remembers only a couple of readings recently  He thinks he has fairly good fasting readings  He thinks he has had only one reading over 200 before lunch but they are better now after breakfast  Probably not checking many readings in the afternoon but there are also usually not over 200  Has forgotten to check blood sugars after supper  Oral hypoglycemic drugs the patient is taking are: Prandin before meals as above Side effects from medications have been: None  Compliance with the medical regimen: Good, usually trying to take his Prandin before eating    Hypoglycemia: None    Glucose monitoring:  done usually 1-2  times a day         Glucometer:  FreeStyle     Blood Glucose readings by recall:  Mean values apply above for all meters except median for One Touch  PRE-MEAL Fasting Lunch Dinner Bedtime Overall  Glucose range: 112 234     Mean/median:        POST-MEAL PC Breakfast PC Lunch PC Dinner  Glucose range: 130 190 ?  Mean/median:      Dietician visit, most recent: 8/15 DIET: Recently has been trying to reduce fried foods and also eating eggs and toast instead of cereal in the morning               Exercise:  walking 3/7 days a week, about 1 mile  Weight history:   Wt Readings from Last 3 Encounters:  04/26/15 132 lb 9.6 oz (60.147 kg)  04/24/15 131 lb (59.421 kg)  04/12/15 135 lb (61.236 kg)    Glycemic control:   Lab Results  Component Value Date   HGBA1C 6.8 04/26/2015   HGBA1C 9.5* 01/19/2015   HGBA1C 9.4* 10/10/2014   Lab Results  Component Value Date   MICROALBUR 5.7* 01/19/2015   LDLCALC 84 06/23/2014   CREATININE 1.66* 04/10/2015         Medication  List       This list is accurate as of: 04/26/15 10:18 AM.  Always use your most recent med list.               amLODipine 10 MG tablet  Commonly known as:  NORVASC  Take 1 tablet (10 mg total) by mouth at bedtime.     aspirin 81 MG chewable tablet  Chew 81 mg by mouth daily.     atorvastatin 10 MG tablet  Commonly known as:  LIPITOR  TAKE 1 TABLET BY MOUTH DAILY     b complex vitamins tablet  Take 1 tablet by mouth daily.     Cholecalciferol 2000 UNITS Caps  Take by mouth daily.     clotrimazole-betamethasone cream  Commonly known as:  LOTRISONE  Apply 1 application topically 2 (two) times daily. apply to lesion on chest wall     Darunavir Ethanolate 800 MG tablet  Commonly known as:  PREZISTA  Take 1 tablet (800 mg total) by mouth daily.     dolutegravir 50 MG tablet  Commonly known as:  TIVICAY  Take 1 tablet (50 mg total) by  mouth daily.     freestyle lancets  Use to check blood sugars 3 times daily.     furosemide 20 MG tablet  Commonly known as:  LASIX  TAKE 2 TABLETS BY MOUTH DAILY AS NEEDED FOR FLUID OR EDEMA     glucose blood test strip  Commonly known as:  FREESTYLE TEST STRIPS  USE TO CHECK BLOOD SUGAR once daily     KRILL OIL OMEGA-3 PO  Take 1 capsule by mouth daily.     lisinopril 5 MG tablet  Commonly known as:  PRINIVIL,ZESTRIL  Take 1/2 by mouth daily     metolazone 2.5 MG tablet  Commonly known as:  ZAROXOLYN  TAKE 1 TABLET BY MOUTH EVERY DAY 20 MINUTES PRIOR TO LASIX     NORVIR 100 MG Tabs tablet  Generic drug:  ritonavir  TAKE 1 TABLET BY MOUTH EVERY DAY     omeprazole 20 MG capsule  Commonly known as:  PRILOSEC  Take 1 capsule (20 mg total) by mouth daily. Take 12 hours apart from Edurant dose.     potassium chloride SA 20 MEQ tablet  Commonly known as:  K-DUR,KLOR-CON  Take 1 tablet (20 mEq total) by mouth 2 (two) times daily.     PROBIOTIC DAILY PO  Take 1 tablet by mouth daily.     repaglinide 2 MG tablet  Commonly known as:  PRANDIN  2 tablets before breakfast, 1 before lunch and 2 before supper     rilpivirine 25 MG Tabs tablet  Commonly known as:  EDURANT  Take 1 tablet (25 mg total) by mouth daily with breakfast.     sitaGLIPtin 50 MG tablet  Commonly known as:  JANUVIA  Take 1 tablet (50 mg total) by mouth daily.     sodium bicarbonate 650 MG tablet  Take 650 mg by mouth 3 (three) times daily.     valACYclovir 500 MG tablet  Commonly known as:  VALTREX  TAKE 1 TABLET BY MOUTH TWICE DAILY        Allergies: No Known Allergies  Past Medical History  Diagnosis Date  . Arthritis   . Diabetes mellitus   . History of depression   . GERD (gastroesophageal reflux disease)   . CAD (coronary artery disease)     2003- cabg  . Hypertension   .  Hyperlipidemia   . History of kidney stones   . COPD (chronic obstructive pulmonary disease)   . HIV (human  immunodeficiency virus infection) 1991    on meds since initial dx.   . Dermatitis 11/27/2012  . Nocturia 03/06/2013  . Epicondylitis 06/05/2013    right  . Pancreatitis 11/2013    attributed to HIV meds.   . Constipation 05/28/2014  . Pain in joint, ankle and foot 01/19/2015  . Chronic kidney disease     Past Surgical History  Procedure Laterality Date  . Coronary artery bypass graft  05/2002  . Vasectomy      Family History  Problem Relation Age of Onset  . Arthritis      mother/father/paternal grandparents  . Breast cancer Maternal Aunt     paternal aunt  . Lung cancer Maternal Aunt   . Hyperlipidemia Mother   . Hyperlipidemia Father   . Heart disease      parents/maternal grandparents/ 2 brothers  . Stroke Paternal Grandmother   . Hypertension Father     paternal grandmother/3 brothers/1 sister  . Mental retardation Sister   . Diabetes Mother     paternal grandparents/1 brother    Social History:  reports that he has never smoked. He has never used smokeless tobacco. He reports that he does not drink alcohol or use illicit drugs.    Review of Systems    Lipid history: On Lipitor 10 mg only, previously had been on gemfibrozil and fenofibrate also    Lab Results  Component Value Date   CHOL 157 01/19/2015   HDL 33.40* 01/19/2015   LDLCALC 84 06/23/2014   LDLDIRECT 80.0 01/19/2015   TRIG 269.0* 01/19/2015   CHOLHDL 5 01/19/2015           Most recent eye exam was nearly a year ago in 2015, reportedly normal   RENAL dysfunction: Followed by nephrologist, etiology unclear  Lab Results  Component Value Date   CREATININE 1.66* 04/10/2015   BUN 31* 04/10/2015   NA 140 04/10/2015   K 4.1 04/10/2015   CL 101 04/10/2015   CO2 26 04/10/2015     Hypertension: Has been treated with various drugs in the past, now taking only amlodipine and low-dose lisinopril  Diabetic foot exam  in 7/16 shows normal monofilament sensation in the toes and plantar surfaces, no  skin lesions or ulcers on the feet and practically absent pedal pulses  LABS:  Office Visit on 04/26/2015  Component Date Value Ref Range Status  . Hemoglobin A1C 04/26/2015 6.8   Final    Physical Examination:  BP 112/60 mmHg  Pulse 77  Temp(Src) 98.1 F (36.7 C)  Resp 14  Ht 5\' 3"  (1.6 m)  Wt 132 lb 9.6 oz (60.147 kg)  BMI 23.49 kg/m2  SpO2 98%               ASSESSMENT:  Diabetes type 2, uncontrolled with BMI 23 but has some abdominal obesity    He appears to have responded to using Prandin for mealtime blood sugar control A1c is excellent now at 6.8 He is taking near maximal doses of Prandin, lower doses before lunch Also probably improving his control with changing his diet after discussion with dietitian His home blood sugars were not reviewed in detail since he did not bring his monitor today   PLAN:   Continue Prandin dose to 4 mg at breakfast and supper and stay on 2 mg at lunch  He will try  to be consistent with diet  Encouraged him to be as active as possible and try to walk indoors if he cannot go out  Continue Januvia also   Patient Instructions  Check blood sugars on waking up ..2  .. times a week Also check blood sugars about 2 hours after a meal and do this after different meals by rotation  Recommended blood sugar levels on waking up is 90-130 and about 2 hours after meal is 140-180 Please bring blood sugar monitor to each visit.     Bradi Arbuthnot 04/26/2015, 10:18 AM   Note: This office note was prepared with Dragon voice recognition system technology. Any transcriptional errors that result from this process are unintentional.

## 2015-04-26 NOTE — Patient Instructions (Signed)
Check blood sugars on waking up .. 2 .. times a week Also check blood sugars about 2 hours after a meal and do this after different meals by rotation  Recommended blood sugar levels on waking up is 90-130 and about 2 hours after meal is 140-180 Please bring blood sugar monitor to each visit.   

## 2015-04-27 ENCOUNTER — Ambulatory Visit (INDEPENDENT_AMBULATORY_CARE_PROVIDER_SITE_OTHER): Payer: Medicare Other | Admitting: Family Medicine

## 2015-04-27 ENCOUNTER — Encounter: Payer: Self-pay | Admitting: Family Medicine

## 2015-04-27 ENCOUNTER — Encounter: Payer: Self-pay | Admitting: Podiatry

## 2015-04-27 ENCOUNTER — Ambulatory Visit (INDEPENDENT_AMBULATORY_CARE_PROVIDER_SITE_OTHER): Payer: Medicare Other | Admitting: Podiatry

## 2015-04-27 VITALS — BP 130/61 | HR 75

## 2015-04-27 VITALS — BP 112/72 | HR 76 | Temp 97.9°F | Ht 63.0 in | Wt 131.5 lb

## 2015-04-27 DIAGNOSIS — M79673 Pain in unspecified foot: Secondary | ICD-10-CM

## 2015-04-27 DIAGNOSIS — E11319 Type 2 diabetes mellitus with unspecified diabetic retinopathy without macular edema: Secondary | ICD-10-CM | POA: Diagnosis not present

## 2015-04-27 DIAGNOSIS — E785 Hyperlipidemia, unspecified: Secondary | ICD-10-CM

## 2015-04-27 DIAGNOSIS — K219 Gastro-esophageal reflux disease without esophagitis: Secondary | ICD-10-CM

## 2015-04-27 DIAGNOSIS — M216X9 Other acquired deformities of unspecified foot: Secondary | ICD-10-CM

## 2015-04-27 DIAGNOSIS — E1140 Type 2 diabetes mellitus with diabetic neuropathy, unspecified: Secondary | ICD-10-CM

## 2015-04-27 DIAGNOSIS — E875 Hyperkalemia: Secondary | ICD-10-CM

## 2015-04-27 DIAGNOSIS — M21969 Unspecified acquired deformity of unspecified lower leg: Secondary | ICD-10-CM

## 2015-04-27 DIAGNOSIS — M25569 Pain in unspecified knee: Secondary | ICD-10-CM

## 2015-04-27 DIAGNOSIS — I10 Essential (primary) hypertension: Secondary | ICD-10-CM

## 2015-04-27 DIAGNOSIS — L570 Actinic keratosis: Secondary | ICD-10-CM

## 2015-04-27 LAB — LIPID PANEL
CHOL/HDL RATIO: 5
Cholesterol: 137 mg/dL (ref 0–200)
HDL: 29.4 mg/dL — AB (ref >39.00)
NONHDL: 107.79
TRIGLYCERIDES: 266 mg/dL — AB (ref 0.0–149.0)
VLDL: 53.2 mg/dL — ABNORMAL HIGH (ref 0.0–40.0)

## 2015-04-27 LAB — COMPREHENSIVE METABOLIC PANEL
ALK PHOS: 69 U/L (ref 39–117)
ALT: 20 U/L (ref 0–53)
AST: 18 U/L (ref 0–37)
Albumin: 4.5 g/dL (ref 3.5–5.2)
BUN: 32 mg/dL — ABNORMAL HIGH (ref 6–23)
CALCIUM: 9.6 mg/dL (ref 8.4–10.5)
CO2: 27 mEq/L (ref 19–32)
Chloride: 100 mEq/L (ref 96–112)
Creatinine, Ser: 1.76 mg/dL — ABNORMAL HIGH (ref 0.40–1.50)
GFR: 41.26 mL/min — AB (ref >60.00)
GLUCOSE: 136 mg/dL — AB (ref 70–99)
POTASSIUM: 3.9 meq/L (ref 3.5–5.1)
Sodium: 138 mEq/L (ref 135–145)
TOTAL PROTEIN: 7.9 g/dL (ref 6.0–8.3)
Total Bilirubin: 0.5 mg/dL (ref 0.2–1.2)

## 2015-04-27 LAB — TSH: TSH: 2.16 u[IU]/mL (ref 0.35–4.50)

## 2015-04-27 LAB — CBC
HCT: 34.6 % — ABNORMAL LOW (ref 39.0–52.0)
HEMOGLOBIN: 12 g/dL — AB (ref 13.0–17.0)
MCHC: 34.6 g/dL (ref 30.0–36.0)
MCV: 92.6 fl (ref 78.0–100.0)
PLATELETS: 177 10*3/uL (ref 150.0–400.0)
RBC: 3.74 Mil/uL — AB (ref 4.22–5.81)
RDW: 15 % (ref 11.5–15.5)
WBC: 5.3 10*3/uL (ref 4.0–10.5)

## 2015-04-27 LAB — LDL CHOLESTEROL, DIRECT: LDL DIRECT: 72 mg/dL

## 2015-04-27 MED ORDER — FUROSEMIDE 20 MG PO TABS: ORAL_TABLET | ORAL | Status: DC

## 2015-04-27 NOTE — Progress Notes (Signed)
Pre visit review using our clinic review tool, if applicable. No additional management support is needed unless otherwise documented below in the visit note. 

## 2015-04-27 NOTE — Patient Instructions (Signed)
Callus debrided. Measured for diabetic shoes.

## 2015-04-27 NOTE — Progress Notes (Signed)
SUBJECTIVE: 69 y.o. year old male presents for diabetic foot care.  A1c came down from 9 to 6.  Right foot is better, but the left foot under the 5th metatarsal is still painful with build up callus.  Patient request diabetic shoes. His PCP certified necessity form.   OBJECTIVE:  DERMATOLOGIC EXAMINATION: Nails: normal appearing nails bilaterally Positive of plantar callus under 5th MPJ R>L.  VASCULAR EXAMINATION OF LOWER LIMBS: Pedal pulses: Not palpable on both feet. NEUROLOGIC EXAMINATION OF THE LOWER LIMBS: Achilles DTR is present and within normal. Monofilament (Semmes-Weinstein 10-gm) sensory testing positive 6 out of 6, bilateral. Vibratory sensations(128Hz  turning fork) intact at medial and lateral forefoot bilateral.  Sharp and Dull discriminatory sensations at the plantar ball of hallux is intact bilateral.  MUSCULOSKELETAL EXAMINATION: Positive for protruding 5th MPJ bilateral, 3rd MPJ left with pain upon weight bearing.  Normal range of motion on ankle and forefoot joint bilateral. Thin plantar ball fat pad.   ASSESSMENT: Capsulitis 5th MPJ left with callus. Loss of plantar fat pad. Painful feet bilateral.   PLAN: Reviewed clinical findings and available treatment options. All calluses debrided. Both feet measured for diabetic shoes. Return in 3 months or as needed. Will call patient when shoes arrive.

## 2015-04-27 NOTE — Patient Instructions (Signed)
Cholesterol  Cholesterol is a white, waxy, fat-like substance needed by your body in small amounts. The liver makes all the cholesterol you need. Cholesterol is carried from the liver by the blood through the blood vessels. Deposits of cholesterol (plaque) may build up on blood vessel walls. These make the arteries narrower and stiffer. Cholesterol plaques increase the risk for heart attack and stroke.   You cannot feel your cholesterol level even if it is very high. The only way to know it is high is with a blood test. Once you know your cholesterol levels, you should keep a record of the test results. Work with your health care Geddy Boydstun to keep your levels in the desired range.   WHAT DO THE RESULTS MEAN?  · Total cholesterol is a rough measure of all the cholesterol in your blood.    · LDL is the so-called bad cholesterol. This is the type that deposits cholesterol in the walls of the arteries. You want this level to be low.    · HDL is the good cholesterol because it cleans the arteries and carries the LDL away. You want this level to be high.  · Triglycerides are fat that the body can either burn for energy or store. High levels are closely linked to heart disease.    WHAT ARE THE DESIRED LEVELS OF CHOLESTEROL?  · Total cholesterol below 200.    · LDL below 100 for people at risk, below 70 for those at very high risk.    · HDL above 50 is good, above 60 is best.    · Triglycerides below 150.    HOW CAN I LOWER MY CHOLESTEROL?  · Diet. Follow your diet programs as directed by your health care Masud Holub.    ¨ Choose fish or white meat chicken and turkey, roasted or baked. Limit fatty cuts of red meat, fried foods, and processed meats, such as sausage and lunch meats.    ¨ Eat lots of fresh fruits and vegetables.  ¨ Choose whole grains, beans, pasta, potatoes, and cereals.    ¨ Use only small amounts of olive, corn, or canola oils.    ¨ Avoid butter, mayonnaise, shortening, or palm kernel oils.  ¨ Avoid foods with  trans fats.    ¨ Drink skim or nonfat milk and eat low-fat or nonfat yogurt and cheeses. Avoid whole milk, cream, ice cream, egg yolks, and full-fat cheeses.    ¨ Healthy desserts include angel food cake, ginger snaps, animal crackers, hard candy, popsicles, and low-fat or nonfat frozen yogurt. Avoid pastries, cakes, pies, and cookies.    · Exercise. Follow your exercise programs as directed by your health care Tomasina Keasling.    ¨ A regular program helps decrease LDL and raise HDL.    ¨ A regular program helps with weight control.    ¨ Do things that increase your activity level like gardening, walking, or taking the stairs. Ask your health care Kadie Balestrieri about how you can be more active in your daily life.    · Medicine. Take medicine only as directed by your health care Yazmyne Sara.    ¨ Medicine may be prescribed by your health care Anaih Brander to help lower cholesterol and decrease the risk for heart disease.    ¨ If you have several risk factors, you may need medicine even if your levels are normal.  Document Released: 04/15/2001 Document Revised: 12/05/2013 Document Reviewed: 05/04/2013  [employer]® Patient Information ©2015 [employer]. This information is not intended to replace advice given to you by your health care Harlem Thresher. Make sure you discuss any questions you have with your   health care Joannie Medine.

## 2015-04-27 NOTE — Progress Notes (Deleted)
Patient ID: [name], male   DOB: 10-29-1945, 69 y.o.   MRN: [id number]   Subjective:    Patient ID: [name], male    DOB: 1946/01/27, 69 y.o.   MRN: [id number]  Chief Complaint  Patient presents with  . Follow-up    3 month    HPI Patient is in today for ***  Past Medical History  Diagnosis Date  . Arthritis   . Diabetes mellitus   . History of depression   . GERD (gastroesophageal reflux disease)   . CAD (coronary artery disease)     2003- cabg  . Hypertension   . Hyperlipidemia   . History of kidney stones   . COPD (chronic obstructive pulmonary disease)   . HIV (human immunodeficiency virus infection) 1991    on meds since initial dx.   . Dermatitis 11/27/2012  . Nocturia 03/06/2013  . Epicondylitis 06/05/2013    right  . Pancreatitis 11/2013    attributed to HIV meds.   . Constipation 05/28/2014  . Pain in joint, ankle and foot 01/19/2015  . Chronic kidney disease     Past Surgical History  Procedure Laterality Date  . Coronary artery bypass graft  05/2002  . Vasectomy      Family History  Problem Relation Age of Onset  . Arthritis      mother/father/paternal grandparents  . Breast cancer Maternal Aunt     paternal aunt  . Lung cancer Maternal Aunt   . Hyperlipidemia Mother   . Hyperlipidemia Father   . Heart disease      parents/maternal grandparents/ 2 brothers  . Stroke Paternal Grandmother   . Hypertension Father     paternal grandmother/3 brothers/1 sister  . Mental retardation Sister   . Diabetes Mother     paternal grandparents/1 brother    Social History   Social History  . Marital Status: Divorced    Spouse Name: N/A  . Number of Children: 4  . Years of Education: N/A   Occupational History  . 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    Social History Main Topics  . Smoking status: Never Smoker   . Smokeless tobacco: Never Used  . Alcohol Use: No  . Drug Use: No  . Sexual Activity: Not  Currently     Comment: declined condoms   Other Topics Concern  . Not on file   Social History Narrative   Lives alone.  Supportive friends and family.  His HIV Dx is not a secret.     Outpatient Prescriptions Prior to Visit  Medication Sig Dispense Refill  . amLODipine (NORVASC) 10 MG tablet Take 1 tablet (10 mg total) by mouth at bedtime. 90 tablet 1  . aspirin 81 MG chewable tablet Chew 81 mg by mouth daily.    Marland Kitchen atorvastatin (LIPITOR) 10 MG tablet TAKE 1 TABLET BY MOUTH DAILY 30 tablet 6  . b complex vitamins tablet Take 1 tablet by mouth daily.    . Cholecalciferol 2000 UNITS CAPS Take by mouth daily.    . clotrimazole-betamethasone (LOTRISONE) cream Apply 1 application topically 2 (two) times daily. apply to lesion on chest wall 45 g 1  . Darunavir Ethanolate (PREZISTA) 800 MG tablet Take 1 tablet (800 mg total) by mouth daily. 30 tablet 6  . dolutegravir (TIVICAY) 50 MG tablet Take 1 tablet (50 mg total) by mouth daily. 30 tablet 11  . glucose blood (FREESTYLE TEST  STRIPS) test strip USE TO CHECK BLOOD SUGAR once daily 50 each 5  . KRILL OIL OMEGA-3 PO Take 1 capsule by mouth daily.    . Lancets (FREESTYLE) lancets Use to check blood sugars 3 times daily. 100 each 5  . lisinopril (PRINIVIL,ZESTRIL) 5 MG tablet Take 1/2 by mouth daily (Patient taking differently: Take 2.5 mg by mouth every other day. Take 1/2 by mouth daily) 15 tablet 3  . metolazone (ZAROXOLYN) 2.5 MG tablet TAKE 1 TABLET BY MOUTH EVERY DAY 20 MINUTES PRIOR TO LASIX 30 tablet 6  . NORVIR 100 MG TABS tablet TAKE 1 TABLET BY MOUTH EVERY DAY 30 tablet 6  . omeprazole (PRILOSEC) 20 MG capsule Take 1 capsule (20 mg total) by mouth daily. Take 12 hours apart from Edurant dose. 30 capsule 5  . potassium chloride SA (K-DUR,KLOR-CON) 20 MEQ tablet Take 1 tablet (20 mEq total) by mouth 2 (two) times daily. 60 tablet 6  . Probiotic Product (PROBIOTIC DAILY PO) Take 1 tablet by mouth daily.     . repaglinide (PRANDIN) 2 MG  tablet 2 tablets before breakfast, 1 before lunch and 2 before supper 150 tablet 2  . rilpivirine (EDURANT) 25 MG TABS tablet Take 1 tablet (25 mg total) by mouth daily with breakfast. 30 tablet 11  . sitaGLIPtin (JANUVIA) 50 MG tablet Take 1 tablet (50 mg total) by mouth daily. 30 tablet 3  . sodium bicarbonate 650 MG tablet Take 650 mg by mouth 3 (three) times daily.    . valACYclovir (VALTREX) 500 MG tablet TAKE 1 TABLET BY MOUTH TWICE DAILY (Patient taking differently: TAKE 1 TABLET BY MOUTH  DAILY) 60 tablet 5  . furosemide (LASIX) 20 MG tablet TAKE 2 TABLETS BY MOUTH DAILY AS NEEDED FOR FLUID OR EDEMA 30 tablet 0   No facility-administered medications prior to visit.    No Known Allergies  Review of Systems  Constitutional: Negative for fever and malaise/fatigue.  HENT: Negative for congestion.   Eyes: Negative for discharge.  Respiratory: Negative for shortness of breath.   Cardiovascular: Negative for chest pain, palpitations and leg swelling.  Gastrointestinal: Negative for nausea and abdominal pain.  Genitourinary: Negative for dysuria.  Musculoskeletal: Negative for falls.  Skin: Negative for rash.  Neurological: Negative for loss of consciousness and headaches.  Endo/Heme/Allergies: Negative for environmental allergies.  Psychiatric/Behavioral: Negative for depression. The patient is not nervous/anxious.        Objective:    Physical Exam  BP 112/72 mmHg  Pulse 76  Temp(Src) 97.9 F (36.6 C) (Oral)  Ht 5\' 3"  (1.6 m)  Wt 131 lb 8 oz (59.648 kg)  BMI 23.30 kg/m2  SpO2 98% Wt Readings from Last 3 Encounters:  04/27/15 131 lb 8 oz (59.648 kg)  04/26/15 132 lb 9.6 oz (60.147 kg)  04/24/15 131 lb (59.421 kg)     Lab Results  Component Value Date   WBC 5.6 01/19/2015   HGB 12.3* 01/19/2015   HCT 36.1* 01/19/2015   PLT 214.0 01/19/2015   GLUCOSE 150* 04/10/2015   CHOL 157 01/19/2015   TRIG 269.0* 01/19/2015   HDL 33.40* 01/19/2015   LDLDIRECT 80.0  01/19/2015   LDLCALC 84 06/23/2014   ALT 19 04/10/2015   AST 17 04/10/2015   NA 140 04/10/2015   K 4.1 04/10/2015   CL 101 04/10/2015   CREATININE 1.66* 04/10/2015   BUN 31* 04/10/2015   CO2 26 04/10/2015   TSH 1.82 01/19/2015   PSA 0.51 03/02/2013  INR 1.32 04/03/2014   HGBA1C 6.8 04/26/2015   MICROALBUR 5.7* 01/19/2015    Lab Results  Component Value Date   TSH 1.82 01/19/2015   Lab Results  Component Value Date   WBC 5.6 01/19/2015   HGB 12.3* 01/19/2015   HCT 36.1* 01/19/2015   MCV 90.3 01/19/2015   PLT 214.0 01/19/2015   Lab Results  Component Value Date   NA 140 04/10/2015   K 4.1 04/10/2015   CO2 26 04/10/2015   GLUCOSE 150* 04/10/2015   BUN 31* 04/10/2015   CREATININE 1.66* 04/10/2015   BILITOT 0.5 04/10/2015   ALKPHOS 63 04/10/2015   AST 17 04/10/2015   ALT 19 04/10/2015   PROT 7.2 04/10/2015   ALBUMIN 4.1 04/10/2015   CALCIUM 10.1 04/10/2015   ANIONGAP 18* 04/10/2014   GFR 40.76* 02/06/2015   Lab Results  Component Value Date   CHOL 157 01/19/2015   Lab Results  Component Value Date   HDL 33.40* 01/19/2015   Lab Results  Component Value Date   LDLCALC 84 06/23/2014   Lab Results  Component Value Date   TRIG 269.0* 01/19/2015   Lab Results  Component Value Date   CHOLHDL 5 01/19/2015   Lab Results  Component Value Date   HGBA1C 6.8 04/26/2015       Assessment & Plan:   Problem List Items Addressed This Visit    None      I am having Mr. Edmister maintain his b complex vitamins, KRILL OIL OMEGA-3 PO, Probiotic Product (PROBIOTIC DAILY PO), aspirin, valACYclovir, sodium bicarbonate, rilpivirine, dolutegravir, clotrimazole-betamethasone, atorvastatin, omeprazole, Cholecalciferol, potassium chloride SA, sitaGLIPtin, lisinopril, freestyle, glucose blood, NORVIR, metolazone, amLODipine, repaglinide, Darunavir Ethanolate, and furosemide.  Meds ordered this encounter  Medications  . furosemide (LASIX) 20 MG tablet    Sig: TAKE 2  TABLETS BY MOUTH DAILY AS NEEDED FOR FLUID OR EDEMA    Dispense:  60 tablet    Refill:  6     Elizabeth Sauer, LPN

## 2015-04-30 ENCOUNTER — Ambulatory Visit: Payer: Self-pay | Admitting: Family Medicine

## 2015-04-30 ENCOUNTER — Other Ambulatory Visit: Payer: Self-pay | Admitting: Family Medicine

## 2015-04-30 MED ORDER — ATORVASTATIN CALCIUM 20 MG PO TABS: 20.0000 mg | ORAL_TABLET | Freq: Every day | ORAL | Status: DC

## 2015-05-03 ENCOUNTER — Ambulatory Visit: Payer: Self-pay

## 2015-05-03 ENCOUNTER — Other Ambulatory Visit: Payer: Self-pay

## 2015-05-03 NOTE — Patient Outreach (Signed)
[name]) Care Management  05/03/2015  CORON ROSSANO [name], [name] [id number]   Unsuccessful attempt to reach patient for scheduled telephonic assessment.  Message left requesting call back.  Candie Mile, RN, MSN [place] [phone] Fax [phone]

## 2015-05-04 NOTE — Progress Notes (Signed)
Subjective:    Patient ID: [name], male    DOB: 06/12/1946, 69 y.o.   MRN: [id number]  Chief Complaint  Patient presents with  . Follow-up    3 month    HPI Patient is in today for follow-up. Is struggling with some mild congestion but no fevers. No chills. He's been using nasal saline with some improvement. Is noting some sneezing and some pruritus. He continues to follow with [facility]. No recent hospitalizations. Has gone a year without being hospitalized at this time. Denies CP/palp/SOB/HA/fevers/GI or GU c/o. Taking meds as prescribed  Past Medical History  Diagnosis Date  . Arthritis   . Diabetes mellitus   . History of depression   . GERD (gastroesophageal reflux disease)   . CAD (coronary artery disease)     2003- cabg  . Hypertension   . Hyperlipidemia   . History of kidney stones   . COPD (chronic obstructive pulmonary disease)   . HIV (human immunodeficiency virus infection) 1991    on meds since initial dx.   . Dermatitis 11/27/2012  . Nocturia 03/06/2013  . Epicondylitis 06/05/2013    right  . Pancreatitis 11/2013    attributed to HIV meds.   . Constipation 05/28/2014  . Pain in joint, ankle and foot 01/19/2015  . Chronic kidney disease     Past Surgical History  Procedure Laterality Date  . Coronary artery bypass graft  05/2002  . Vasectomy      Family History  Problem Relation Age of Onset  . Arthritis      mother/father/paternal grandparents  . Breast cancer Maternal Aunt     paternal aunt  . Lung cancer Maternal Aunt   . Hyperlipidemia Mother   . Hyperlipidemia Father   . Heart disease      parents/maternal grandparents/ 2 brothers  . Stroke Paternal Grandmother   . Hypertension Father     paternal grandmother/3 brothers/1 sister  . Mental retardation Sister   . Diabetes Mother     paternal grandparents/1 brother    Social History   Social History  . Marital Status: Divorced    Spouse Name: N/A  . Number of Children: 4  .  Years of Education: N/A   Occupational History  . 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    Social History Main Topics  . Smoking status: Never Smoker   . Smokeless tobacco: Never Used  . Alcohol Use: No  . Drug Use: No  . Sexual Activity: Not Currently     Comment: declined condoms   Other Topics Concern  . Not on file   Social History Narrative   Lives alone.  Supportive friends and family.  His HIV Dx is not a secret.     Outpatient Prescriptions Prior to Visit  Medication Sig Dispense Refill  . amLODipine (NORVASC) 10 MG tablet Take 1 tablet (10 mg total) by mouth at bedtime. 90 tablet 1  . aspirin 81 MG chewable tablet Chew 81 mg by mouth daily.    Marland Kitchen b complex vitamins tablet Take 1 tablet by mouth daily.    . Cholecalciferol 2000 UNITS CAPS Take by mouth daily.    . clotrimazole-betamethasone (LOTRISONE) cream Apply 1 application topically 2 (two) times daily. apply to lesion on chest wall 45 g 1  . Darunavir Ethanolate (PREZISTA) 800 MG tablet Take 1 tablet (800 mg total) by mouth daily. 30 tablet 6  . dolutegravir (  TIVICAY) 50 MG tablet Take 1 tablet (50 mg total) by mouth daily. 30 tablet 11  . glucose blood (FREESTYLE TEST STRIPS) test strip USE TO CHECK BLOOD SUGAR once daily 50 each 5  . KRILL OIL OMEGA-3 PO Take 1 capsule by mouth daily.    . Lancets (FREESTYLE) lancets Use to check blood sugars 3 times daily. 100 each 5  . lisinopril (PRINIVIL,ZESTRIL) 5 MG tablet Take 1/2 by mouth daily (Patient taking differently: Take 2.5 mg by mouth every other day. Take 1/2 by mouth daily) 15 tablet 3  . metolazone (ZAROXOLYN) 2.5 MG tablet TAKE 1 TABLET BY MOUTH EVERY DAY 20 MINUTES PRIOR TO LASIX 30 tablet 6  . NORVIR 100 MG TABS tablet TAKE 1 TABLET BY MOUTH EVERY DAY 30 tablet 6  . omeprazole (PRILOSEC) 20 MG capsule Take 1 capsule (20 mg total) by mouth daily. Take 12 hours apart from Edurant dose. 30 capsule 5  . potassium chloride SA  (K-DUR,KLOR-CON) 20 MEQ tablet Take 1 tablet (20 mEq total) by mouth 2 (two) times daily. 60 tablet 6  . Probiotic Product (PROBIOTIC DAILY PO) Take 1 tablet by mouth daily.     . repaglinide (PRANDIN) 2 MG tablet 2 tablets before breakfast, 1 before lunch and 2 before supper 150 tablet 2  . rilpivirine (EDURANT) 25 MG TABS tablet Take 1 tablet (25 mg total) by mouth daily with breakfast. 30 tablet 11  . sitaGLIPtin (JANUVIA) 50 MG tablet Take 1 tablet (50 mg total) by mouth daily. 30 tablet 3  . sodium bicarbonate 650 MG tablet Take 650 mg by mouth 3 (three) times daily.    . valACYclovir (VALTREX) 500 MG tablet TAKE 1 TABLET BY MOUTH TWICE DAILY (Patient taking differently: TAKE 1 TABLET BY MOUTH  DAILY) 60 tablet 5  . atorvastatin (LIPITOR) 10 MG tablet TAKE 1 TABLET BY MOUTH DAILY 30 tablet 6  . furosemide (LASIX) 20 MG tablet TAKE 2 TABLETS BY MOUTH DAILY AS NEEDED FOR FLUID OR EDEMA 30 tablet 0   No facility-administered medications prior to visit.    No Known Allergies  Review of Systems  Constitutional: Positive for malaise/fatigue. Negative for fever.  HENT: Negative for congestion.   Eyes: Negative for discharge.  Respiratory: Negative for shortness of breath.   Cardiovascular: Negative for chest pain, palpitations and leg swelling.  Gastrointestinal: Negative for nausea and abdominal pain.  Genitourinary: Negative for dysuria.  Musculoskeletal: Negative for falls.  Skin: Negative for rash.  Neurological: Negative for loss of consciousness and headaches.  Endo/Heme/Allergies: Negative for environmental allergies.  Psychiatric/Behavioral: Negative for depression. The patient is not nervous/anxious.        Objective:    Physical Exam  Constitutional: He is oriented to person, place, and time. He appears well-developed and well-nourished. No distress.  HENT:  Head: Normocephalic and atraumatic.  Nose: Nose normal.  Eyes: Right eye exhibits no discharge. Left eye exhibits  no discharge.  Neck: Normal range of motion. Neck supple.  Cardiovascular: Normal rate and regular rhythm.   No murmur heard. Pulmonary/Chest: Effort normal and breath sounds normal.  Abdominal: Soft. Bowel sounds are normal. There is no tenderness.  Musculoskeletal: He exhibits no edema.  Neurological: He is alert and oriented to person, place, and time.  Skin: Skin is warm and dry.  Psychiatric: He has a normal mood and affect.  Nursing note and vitals reviewed.   BP 112/72 mmHg  Pulse 76  Temp(Src) 97.9 F (36.6 C) (Oral)  Ht 5'  3" (1.6 m)  Wt 131 lb 8 oz (59.648 kg)  BMI 23.30 kg/m2  SpO2 98% Wt Readings from Last 3 Encounters:  04/27/15 131 lb 8 oz (59.648 kg)  04/26/15 132 lb 9.6 oz (60.147 kg)  04/24/15 131 lb (59.421 kg)     Lab Results  Component Value Date   WBC 5.3 04/27/2015   HGB 12.0* 04/27/2015   HCT 34.6* 04/27/2015   PLT 177.0 04/27/2015   GLUCOSE 136* 04/27/2015   CHOL 137 04/27/2015   TRIG 266.0* 04/27/2015   HDL 29.40* 04/27/2015   LDLDIRECT 72.0 04/27/2015   LDLCALC 84 06/23/2014   ALT 20 04/27/2015   AST 18 04/27/2015   NA 138 04/27/2015   K 3.9 04/27/2015   CL 100 04/27/2015   CREATININE 1.76* 04/27/2015   BUN 32* 04/27/2015   CO2 27 04/27/2015   TSH 2.16 04/27/2015   PSA 0.51 03/02/2013   INR 1.32 04/03/2014   HGBA1C 6.8 04/26/2015   MICROALBUR 5.7* 01/19/2015    Lab Results  Component Value Date   TSH 2.16 04/27/2015   Lab Results  Component Value Date   WBC 5.3 04/27/2015   HGB 12.0* 04/27/2015   HCT 34.6* 04/27/2015   MCV 92.6 04/27/2015   PLT 177.0 04/27/2015   Lab Results  Component Value Date   NA 138 04/27/2015   K 3.9 04/27/2015   CO2 27 04/27/2015   GLUCOSE 136* 04/27/2015   BUN 32* 04/27/2015   CREATININE 1.76* 04/27/2015   BILITOT 0.5 04/27/2015   ALKPHOS 69 04/27/2015   AST 18 04/27/2015   ALT 20 04/27/2015   PROT 7.9 04/27/2015   ALBUMIN 4.5 04/27/2015   CALCIUM 9.6 04/27/2015   ANIONGAP 18*  04/10/2014   GFR 41.26* 04/27/2015   Lab Results  Component Value Date   CHOL 137 04/27/2015   Lab Results  Component Value Date   HDL 29.40* 04/27/2015   Lab Results  Component Value Date   LDLCALC 84 06/23/2014   Lab Results  Component Value Date   TRIG 266.0* 04/27/2015   Lab Results  Component Value Date   CHOLHDL 5 04/27/2015   Lab Results  Component Value Date   HGBA1C 6.8 04/26/2015       Assessment & Plan:   Problem List Items Addressed This Visit    Hyperkalemia   Relevant Orders   Comprehensive metabolic panel (Completed)   HLD (hyperlipidemia)    Encouraged heart healthy diet, increase exercise, avoid trans fats, consider a krill oil cap daily      Relevant Medications   furosemide (LASIX) 20 MG tablet   Other Relevant Orders   Lipid panel (Completed)   GERD    Avoid offending foods, start probiotics. Do not eat large meals in late evening and consider raising head of bed.       Essential hypertension - Primary    Well controlled, no changes to meds. Encouraged heart healthy diet such as the DASH diet and exercise as tolerated.       Relevant Medications   furosemide (LASIX) 20 MG tablet   Other Relevant Orders   Comprehensive metabolic panel (Completed)   CBC (Completed)   TSH (Completed)   DM (diabetes mellitus), type 2 with ophthalmic complications    [id number] acceptable, minimize simple carbs. Increase exercise as tolerated. Continue current meds. Agrees to schedule a visit with her opthamologist         I am having Mr. Chiarelli maintain his b complex vitamins, KRILL  OIL OMEGA-3 PO, Probiotic Product (PROBIOTIC DAILY PO), aspirin, valACYclovir, sodium bicarbonate, rilpivirine, dolutegravir, clotrimazole-betamethasone, omeprazole, Cholecalciferol, potassium chloride SA, sitaGLIPtin, lisinopril, freestyle, glucose blood, NORVIR, metolazone, amLODipine, repaglinide, Darunavir Ethanolate, and furosemide.  Meds ordered this encounter  Medications   . furosemide (LASIX) 20 MG tablet    Sig: TAKE 2 TABLETS BY MOUTH DAILY AS NEEDED FOR FLUID OR EDEMA    Dispense:  60 tablet    Refill:  6     Penni Homans, MD

## 2015-05-04 NOTE — Assessment & Plan Note (Signed)
Encouraged heart healthy diet, increase exercise, avoid trans fats, consider a krill oil cap daily 

## 2015-05-04 NOTE — Assessment & Plan Note (Signed)
Avoid offending foods, start probiotics. Do not eat large meals in late evening and consider raising head of bed.  

## 2015-05-04 NOTE — Assessment & Plan Note (Signed)
hgba1c acceptable, minimize simple carbs. Increase exercise as tolerated. Continue current meds. Agrees to schedule a visit with her opthamologist

## 2015-05-04 NOTE — Assessment & Plan Note (Signed)
Well controlled, no changes to meds. Encouraged heart healthy diet such as the DASH diet and exercise as tolerated.  °

## 2015-05-09 ENCOUNTER — Other Ambulatory Visit: Payer: Self-pay

## 2015-05-09 NOTE — Patient Outreach (Signed)
[facility]) Care Management  05/09/2015  [name] 13-Feb-1946 [id number]  Reached patient today, but he is in the process of moving and did not have time to talk.  States he is doing well, and requested I try him again next week.  Candie Mile, RN, MSN [place] [phone] Fax [phone]

## 2015-05-14 ENCOUNTER — Encounter: Payer: Self-pay | Admitting: Family Medicine

## 2015-05-15 ENCOUNTER — Other Ambulatory Visit: Payer: Self-pay | Admitting: Family Medicine

## 2015-05-15 MED ORDER — CEFDINIR 300 MG PO CAPS: 300.0000 mg | ORAL_CAPSULE | Freq: Two times a day (BID) | ORAL | Status: DC

## 2015-05-16 ENCOUNTER — Other Ambulatory Visit: Payer: Self-pay

## 2015-05-16 NOTE — Patient Outreach (Signed)
[facility]) Care Management  [place]  05/16/2015   GLEEN RIPBERGER 1946/05/04 [id number]  Subjective: Patient reports he has been busy moving into a smaller apartment, and has not been monitoring his blood sugars regularly and has not focused on making healthy food choices. Reports his last fasting cbg was 115, and that 2 hour post prandial readings have been under 200.  Current Medications:  Current Outpatient Prescriptions  Medication Sig Dispense Refill  . amLODipine (NORVASC) 10 MG tablet Take 1 tablet (10 mg total) by mouth at bedtime. 90 tablet 1  . aspirin 81 MG chewable tablet Chew 81 mg by mouth daily.    Marland Kitchen atorvastatin (LIPITOR) 20 MG tablet Take 1 tablet (20 mg total) by mouth daily. 30 tablet 3  . b complex vitamins tablet Take 1 tablet by mouth daily.    . cefdinir (OMNICEF) 300 MG capsule Take 1 capsule (300 mg total) by mouth 2 (two) times daily. 20 capsule 0  . Cholecalciferol 2000 UNITS CAPS Take by mouth daily.    . clotrimazole-betamethasone (LOTRISONE) cream Apply 1 application topically 2 (two) times daily. apply to lesion on chest wall 45 g 1  . Darunavir Ethanolate (PREZISTA) 800 MG tablet Take 1 tablet (800 mg total) by mouth daily. 30 tablet 6  . dolutegravir (TIVICAY) 50 MG tablet Take 1 tablet (50 mg total) by mouth daily. 30 tablet 11  . furosemide (LASIX) 20 MG tablet TAKE 2 TABLETS BY MOUTH DAILY AS NEEDED FOR FLUID OR EDEMA 60 tablet 6  . glucose blood (FREESTYLE TEST STRIPS) test strip USE TO CHECK BLOOD SUGAR once daily 50 each 5  . KRILL OIL OMEGA-3 PO Take 1 capsule by mouth daily.    . Lancets (FREESTYLE) lancets Use to check blood sugars 3 times daily. 100 each 5  . lisinopril (PRINIVIL,ZESTRIL) 5 MG tablet Take 1/2 by mouth daily (Patient taking differently: Take 2.5 mg by mouth every other day. Take 1/2 by mouth daily) 15 tablet 3  . metolazone (ZAROXOLYN) 2.5 MG tablet TAKE 1 TABLET BY MOUTH EVERY DAY 20 MINUTES PRIOR TO  LASIX 30 tablet 6  . NORVIR 100 MG TABS tablet TAKE 1 TABLET BY MOUTH EVERY DAY 30 tablet 6  . omeprazole (PRILOSEC) 20 MG capsule Take 1 capsule (20 mg total) by mouth daily. Take 12 hours apart from Edurant dose. 30 capsule 5  . potassium chloride SA (K-DUR,KLOR-CON) 20 MEQ tablet Take 1 tablet (20 mEq total) by mouth 2 (two) times daily. 60 tablet 6  . Probiotic Product (PROBIOTIC DAILY PO) Take 1 tablet by mouth daily.     . repaglinide (PRANDIN) 2 MG tablet 2 tablets before breakfast, 1 before lunch and 2 before supper 150 tablet 2  . rilpivirine (EDURANT) 25 MG TABS tablet Take 1 tablet (25 mg total) by mouth daily with breakfast. 30 tablet 11  . sitaGLIPtin (JANUVIA) 50 MG tablet Take 1 tablet (50 mg total) by mouth daily. 30 tablet 3  . sodium bicarbonate 650 MG tablet Take 650 mg by mouth 3 (three) times daily.    . valACYclovir (VALTREX) 500 MG tablet TAKE 1 TABLET BY MOUTH TWICE DAILY (Patient taking differently: TAKE 1 TABLET BY MOUTH  DAILY) 60 tablet 5   No current facility-administered medications for this visit.    Functional Status:  In your present state of health, do you have any difficulty performing the following activities: 05/16/2015 04/12/2015  Hearing? Tempie Donning  Vision? N N  Difficulty  concentrating or making decisions? N N  Walking or climbing stairs? Y Y  Dressing or bathing? N N  Doing errands, shopping? N N  Preparing Food and eating ? - N  Using the Toilet? - N  In the past six months, have you accidently leaked urine? - N  Do you have problems with loss of bowel control? - N  Managing your Medications? - N  Managing your Finances? - N  Housekeeping or managing your Housekeeping? - N    Fall/Depression Screening: PHQ 2/9 Scores 05/16/2015 04/24/2015 03/29/2015 03/15/2015 10/05/2014 06/05/2014 02/14/2014  PHQ - 2 Score 0 0 0 0 0 0 0   THN CM Care Plan Problem One        Most Recent Value   Care Plan Problem One  Elevated A1C, measured at over 9 on 02-28-15   Role  Documenting the Problem One  [place] for Problem One  Active   THN Long Term Goal (31-90 days)  A1C will decrease by 2 points within 90 days.   THN Long Term Goal Start Date  03/15/15   THN Long Term Goal Met Date  05/16/15   Interventions for Problem One Long Term Goal  Most recent A1C was 6.8. [Patient did go for diabetic class.  Education reinforced.]   THN CM Short Term Goal #1 (0-30 days)  Improve average fasting blood sugars to 100 or less within 30 days.   THN CM Short Term Goal #1 Start Date  03/15/15   Interventions for Short Term Goal #1  Educated patient re need to monitor cbgs and keep a record.   THN CM Short Term Goal #2 (0-30 days)  Patient will increase activity levels by walking 3-4 times per week within 30 days.   THN CM Short Term Goal #2 Start Date  03/15/15   Interventions for Short Term Goal #2  Has not been walking on a regular basis.  Reinforced need for regular activity.     Assessment:  Per patient report, he has not been monitoring cbgs regularly, and has not been walking more than a few days a week.  Plan: Patient will resume regular monitoring of fasting and non-fasting cbgs per Dr. Ronnie Derby recommendations.           Patient will focus on making health food choices.           Patient will increase his activity level by walking 3-4 times per week.           RN Health Coach will follow-up in approximately one month.  Candie Mile, RN, MSN [place] [phone] Fax [phone]

## 2015-05-18 ENCOUNTER — Encounter: Payer: Self-pay | Admitting: Internal Medicine

## 2015-05-18 ENCOUNTER — Other Ambulatory Visit: Payer: Self-pay

## 2015-05-18 ENCOUNTER — Other Ambulatory Visit: Payer: Self-pay | Admitting: Internal Medicine

## 2015-05-18 ENCOUNTER — Encounter: Payer: Self-pay | Admitting: Family Medicine

## 2015-05-18 DIAGNOSIS — K219 Gastro-esophageal reflux disease without esophagitis: Secondary | ICD-10-CM

## 2015-05-18 MED ORDER — AMLODIPINE BESYLATE 10 MG PO TABS: 10.0000 mg | ORAL_TABLET | Freq: Every day | ORAL | Status: DC

## 2015-05-18 MED ORDER — VALACYCLOVIR HCL 500 MG PO TABS: 500.0000 mg | ORAL_TABLET | Freq: Two times a day (BID) | ORAL | Status: DC

## 2015-05-18 MED ORDER — SITAGLIPTIN PHOSPHATE 50 MG PO TABS: 50.0000 mg | ORAL_TABLET | Freq: Every day | ORAL | Status: DC

## 2015-05-18 MED ORDER — LISINOPRIL 5 MG PO TABS: ORAL_TABLET | ORAL | Status: DC

## 2015-05-18 MED ORDER — OMEPRAZOLE 20 MG PO CPDR: 20.0000 mg | DELAYED_RELEASE_CAPSULE | Freq: Every day | ORAL | Status: DC

## 2015-05-20 ENCOUNTER — Other Ambulatory Visit: Payer: Self-pay | Admitting: Internal Medicine

## 2015-05-24 DIAGNOSIS — E119 Type 2 diabetes mellitus without complications: Secondary | ICD-10-CM | POA: Diagnosis not present

## 2015-05-24 DIAGNOSIS — H2513 Age-related nuclear cataract, bilateral: Secondary | ICD-10-CM | POA: Diagnosis not present

## 2015-05-24 LAB — HM DIABETES EYE EXAM

## 2015-06-03 ENCOUNTER — Emergency Department (INDEPENDENT_AMBULATORY_CARE_PROVIDER_SITE_OTHER)
Admission: EM | Admit: 2015-06-03 | Discharge: 2015-06-03 | Disposition: A | Discharge status: Home / Self Care | Payer: Medicare Other | Source: Home / Self Care | Attending: Family Medicine | Admitting: Family Medicine

## 2015-06-03 ENCOUNTER — Encounter: Payer: Self-pay | Admitting: Emergency Medicine

## 2015-06-03 DIAGNOSIS — K1379 Other lesions of oral mucosa: Secondary | ICD-10-CM | POA: Diagnosis not present

## 2015-06-03 MED ORDER — CEPHALEXIN 500 MG PO CAPS: 500.0000 mg | ORAL_CAPSULE | Freq: Three times a day (TID) | ORAL | Status: DC

## 2015-06-03 NOTE — ED Provider Notes (Signed)
CSN: [id number]     Arrival date & time 06/03/15  1102 History   First MD Initiated Contact with Patient 06/03/15 1152     Chief Complaint  Patient presents with  . Sore     HPI Comments: Patient complains of two day history of a sore area on his left cheek that has increased in size and become more tender to palpation.  No drainage from the lesion.  No fevers, chills, and sweats.  No swelling if the cheek.  No fevers, chills, and sweats   The history is provided by the patient.    Past Medical History  Diagnosis Date  . Arthritis   . Diabetes mellitus   . History of depression   . GERD (gastroesophageal reflux disease)   . CAD (coronary artery disease)     2003- cabg  . Hypertension   . Hyperlipidemia   . History of kidney stones   . COPD (chronic obstructive pulmonary disease) ([place])   . HIV (human immunodeficiency virus infection) ([place]) 1991    on meds since initial dx.   . Dermatitis 11/27/2012  . Nocturia 03/06/2013  . Epicondylitis 06/05/2013    right  . Pancreatitis 11/2013    attributed to HIV meds.   . Constipation 05/28/2014  . Pain in joint, ankle and foot 01/19/2015  . Chronic kidney disease    Past Surgical History  Procedure Laterality Date  . Coronary artery bypass graft  05/2002  . Vasectomy     Family History  Problem Relation Age of Onset  . Arthritis      mother/father/paternal grandparents  . Breast cancer Maternal Aunt     paternal aunt  . Lung cancer Maternal Aunt   . Hyperlipidemia Mother   . Hyperlipidemia Father   . Heart disease      parents/maternal grandparents/ 2 brothers  . Stroke Paternal Grandmother   . Hypertension Father     paternal grandmother/3 brothers/1 sister  . Mental retardation Sister   . Diabetes Mother     paternal grandparents/1 brother   Social History  Substance Use Topics  . Smoking status: Never Smoker   . Smokeless tobacco: Never Used  . Alcohol Use: No    Review of Systems  Constitutional: Negative for  fever and chills.  HENT:       Left facial nodule  Skin: Negative for rash.  All other systems reviewed and are negative.   Allergies  Review of patient's allergies indicates no known allergies.  Home Medications   Prior to Admission medications   Medication Sig Start Date End Date Taking? Authorizing Provider  amLODipine (NORVASC) 10 MG tablet Take 1 tablet (10 mg total) by mouth at bedtime. 05/18/15   Mosie Lukes, MD  aspirin 81 MG chewable tablet Chew 81 mg by mouth daily.    Historical Provider, MD  atorvastatin (LIPITOR) 20 MG tablet Take 1 tablet (20 mg total) by mouth daily. 04/30/15   Mosie Lukes, MD  b complex vitamins tablet Take 1 tablet by mouth daily.    Historical Provider, MD  cefdinir (OMNICEF) 300 MG capsule Take 1 capsule (300 mg total) by mouth 2 (two) times daily. 05/15/15   Mosie Lukes, MD  cephALEXin (KEFLEX) 500 MG capsule Take 1 capsule (500 mg total) by mouth 3 (three) times daily. 06/03/15   Kandra Nicolas, MD  Cholecalciferol 2000 UNITS CAPS Take by mouth daily.    Historical Provider, MD  clotrimazole-betamethasone (LOTRISONE) cream Apply 1 application  topically 2 (two) times daily. apply to lesion on chest wall 10/10/14   Mosie Lukes, MD  Darunavir Ethanolate (PREZISTA) 800 MG tablet Take 1 tablet (800 mg total) by mouth daily. 04/20/15   Michel Bickers, MD  dolutegravir (TIVICAY) 50 MG tablet Take 1 tablet (50 mg total) by mouth daily. 10/06/14   Michel Bickers, MD  furosemide (LASIX) 20 MG tablet TAKE 2 TABLETS BY MOUTH DAILY AS NEEDED FOR FLUID OR EDEMA 04/27/15   Mosie Lukes, MD  glucose blood (FREESTYLE TEST STRIPS) test strip USE TO CHECK BLOOD SUGAR once daily 02/28/15   Elayne Snare, MD  KRILL OIL OMEGA-3 PO Take 1 capsule by mouth daily.    Historical Provider, MD  Lancets (FREESTYLE) lancets Use to check blood sugars 3 times daily. 02/16/15   Mosie Lukes, MD  lisinopril (PRINIVIL,ZESTRIL) 5 MG tablet Take 1/2 by mouth daily 05/18/15   Mosie Lukes, MD  metolazone (ZAROXOLYN) 2.5 MG tablet TAKE 1 TABLET BY MOUTH EVERY DAY 20 MINUTES PRIOR TO LASIX 03/22/15   Mosie Lukes, MD  NORVIR 100 MG TABS tablet TAKE 1 TABLET BY MOUTH EVERY DAY 03/21/15   Michel Bickers, MD  omeprazole (PRILOSEC) 20 MG capsule Take 1 capsule (20 mg total) by mouth daily. Take 12 hours apart from Edurant dose. 05/18/15   Michel Bickers, MD  omeprazole (PRILOSEC) 20 MG capsule TAKE ONE CAPSULE BY MOUTH DAILY. TAKE 12 HOURS APART FROM EDURANT DOSE. 05/21/15   Michel Bickers, MD  potassium chloride SA (K-DUR,KLOR-CON) 20 MEQ tablet Take 1 tablet (20 mEq total) by mouth 2 (two) times daily. 01/19/15   Mosie Lukes, MD  Probiotic Product (PROBIOTIC DAILY PO) Take 1 tablet by mouth daily.     Historical Provider, MD  repaglinide (PRANDIN) 2 MG tablet 2 tablets before breakfast, 1 before lunch and 2 before supper 03/29/15   Elayne Snare, MD  rilpivirine (EDURANT) 25 MG TABS tablet Take 1 tablet (25 mg total) by mouth daily with breakfast. 10/06/14   Michel Bickers, MD  sitaGLIPtin (JANUVIA) 50 MG tablet Take 1 tablet (50 mg total) by mouth daily. 05/18/15   Mosie Lukes, MD  sodium bicarbonate 650 MG tablet Take 650 mg by mouth 3 (three) times daily.    Historical Provider, MD  valACYclovir (VALTREX) 500 MG tablet Take 1 tablet (500 mg total) by mouth 2 (two) times daily. 05/18/15   Michel Bickers, MD   Meds Ordered and Administered this Visit  Medications - No data to display  BP 124/65 mmHg  Pulse 75  Temp(Src) 98.3 F (36.8 C) (Oral)  Ht 5\' 3"  (1.6 m)  Wt 131 lb (59.421 kg)  BMI 23.21 kg/m2  SpO2 98% No data found.   Physical Exam  Constitutional: He appears well-developed and well-nourished. No distress.  HENT:  Head: Normocephalic.    Right Ear: External ear normal.  Left Ear: External ear normal.  Nose: Nose normal.  Mouth/Throat: Oropharynx is clear and moist.  On the left cheek there is a subcutaneous mobile nodule approximately 1cm by 63mm, mildly  tender.  No fluctuance or induration.  No erythema or warmth.  No sub-mandibular adenopathy palpated.  Eyes: Conjunctivae and EOM are normal. Pupils are equal, round, and reactive to light.  Neck: Neck supple.  Cardiovascular: Normal heart sounds.   Pulmonary/Chest: Breath sounds normal.  Lymphadenopathy:    He has no cervical adenopathy.  Nursing note and vitals reviewed.   ED Course  Procedures  none  MDM   1. Nodule of cheek    Begin Keflex 500mg  TID Apply warm compress or heating pad 3 to 4 times daily. If symptoms become significantly worse during the night or over the weekend, proceed to the local emergency room.  Followup with Family Doctor if not improved in about 8 days.    Kandra Nicolas, MD 06/07/15 [phone]

## 2015-06-03 NOTE — ED Notes (Signed)
Pt c/o sore on left jaw for 2 days.  It has grown in size, sore to the touch, no redness.

## 2015-06-03 NOTE — Discharge Instructions (Signed)
Apply warm compress or heating pad 3 to 4 times daily. If symptoms become significantly worse during the night or over the weekend, proceed to the local emergency room.

## 2015-06-08 DIAGNOSIS — N401 Enlarged prostate with lower urinary tract symptoms: Secondary | ICD-10-CM | POA: Diagnosis not present

## 2015-06-08 DIAGNOSIS — Z125 Encounter for screening for malignant neoplasm of prostate: Secondary | ICD-10-CM | POA: Diagnosis not present

## 2015-06-08 DIAGNOSIS — R350 Frequency of micturition: Secondary | ICD-10-CM | POA: Diagnosis not present

## 2015-06-12 ENCOUNTER — Encounter: Payer: Self-pay | Admitting: Family Medicine

## 2015-06-12 DIAGNOSIS — N183 Chronic kidney disease, stage 3 (moderate): Secondary | ICD-10-CM | POA: Diagnosis not present

## 2015-06-12 DIAGNOSIS — N189 Chronic kidney disease, unspecified: Secondary | ICD-10-CM | POA: Diagnosis not present

## 2015-06-12 DIAGNOSIS — N2581 Secondary hyperparathyroidism of renal origin: Secondary | ICD-10-CM | POA: Diagnosis not present

## 2015-06-14 ENCOUNTER — Other Ambulatory Visit: Payer: Self-pay | Admitting: Family Medicine

## 2015-06-14 ENCOUNTER — Other Ambulatory Visit: Payer: Self-pay

## 2015-06-14 MED ORDER — PROBIOTIC DAILY PO CAPS: ORAL_CAPSULE | ORAL | Status: DC

## 2015-06-14 MED ORDER — CHOLECALCIFEROL 50 MCG (2000 UT) PO CAPS: 1.0000 | ORAL_CAPSULE | Freq: Every day | ORAL | Status: DC

## 2015-06-14 MED ORDER — KRILL OIL OMEGA-3 300 MG PO CAPS: ORAL_CAPSULE | ORAL | Status: DC

## 2015-06-14 MED ORDER — B COMPLEX PO TABS: 1.0000 | ORAL_TABLET | Freq: Every day | ORAL | Status: DC

## 2015-06-14 NOTE — Patient Outreach (Signed)
[facility]) Care Management  [place]  06/14/2015   [name] 07/31/1946 [id number]  Subjective: States he is doing well.  Has gotten moved into a new apartment.  He was treated at Urgent Care a few weeks ago for a nodule on his left check, which has resolved past antibiotic treatment. Reports he is walking 1-2 miles 3 times a week, and is trying to decrease sweets and bread in his diet.  Fasting blood sugars remain 125-130, with non-fasting results of up to 225.  Current Medications:  Current Outpatient Prescriptions  Medication Sig Dispense Refill  . amLODipine (NORVASC) 10 MG tablet Take 1 tablet (10 mg total) by mouth at bedtime. 90 tablet 1  . aspirin 81 MG chewable tablet Chew 81 mg by mouth daily.    Marland Kitchen atorvastatin (LIPITOR) 20 MG tablet Take 1 tablet (20 mg total) by mouth daily. 30 tablet 3  . b complex vitamins tablet Take 1 tablet by mouth daily. 30 tablet 11  . cefdinir (OMNICEF) 300 MG capsule Take 1 capsule (300 mg total) by mouth 2 (two) times daily. 20 capsule 0  . Cholecalciferol 2000 UNITS CAPS Take 1 capsule (2,000 Units total) by mouth daily. 30 each 11  . clotrimazole-betamethasone (LOTRISONE) cream Apply 1 application topically 2 (two) times daily. apply to lesion on chest wall 45 g 1  . Darunavir Ethanolate (PREZISTA) 800 MG tablet Take 1 tablet (800 mg total) by mouth daily. 30 tablet 6  . dolutegravir (TIVICAY) 50 MG tablet Take 1 tablet (50 mg total) by mouth daily. 30 tablet 11  . furosemide (LASIX) 20 MG tablet TAKE 2 TABLETS BY MOUTH DAILY AS NEEDED FOR FLUID OR EDEMA 60 tablet 6  . glucose blood (FREESTYLE TEST STRIPS) test strip USE TO CHECK BLOOD SUGAR once daily 50 each 5  . Krill Oil Omega-3 300 MG CAPS Take 1 capsule daily 30 capsule 11  . Lancets (FREESTYLE) lancets Use to check blood sugars 3 times daily. 100 each 5  . lisinopril (PRINIVIL,ZESTRIL) 5 MG tablet Take 1/2 by mouth daily 15 tablet 3  . metolazone (ZAROXOLYN)  2.5 MG tablet TAKE 1 TABLET BY MOUTH EVERY DAY 20 MINUTES PRIOR TO LASIX 30 tablet 6  . NORVIR 100 MG TABS tablet TAKE 1 TABLET BY MOUTH EVERY DAY 30 tablet 6  . omeprazole (PRILOSEC) 20 MG capsule Take 1 capsule (20 mg total) by mouth daily. Take 12 hours apart from Edurant dose. 30 capsule 5  . omeprazole (PRILOSEC) 20 MG capsule TAKE ONE CAPSULE BY MOUTH DAILY. TAKE 12 HOURS APART FROM EDURANT DOSE. 30 capsule 5  . potassium chloride SA (K-DUR,KLOR-CON) 20 MEQ tablet Take 1 tablet (20 mEq total) by mouth 2 (two) times daily. 60 tablet 6  . Probiotic Product (PROBIOTIC DAILY) CAPS Take 1 by mouth daily 30 capsule 11  . repaglinide (PRANDIN) 2 MG tablet 2 tablets before breakfast, 1 before lunch and 2 before supper 150 tablet 2  . rilpivirine (EDURANT) 25 MG TABS tablet Take 1 tablet (25 mg total) by mouth daily with breakfast. 30 tablet 11  . sitaGLIPtin (JANUVIA) 50 MG tablet Take 1 tablet (50 mg total) by mouth daily. 30 tablet 3  . sodium bicarbonate 650 MG tablet Take 650 mg by mouth 3 (three) times daily.    . valACYclovir (VALTREX) 500 MG tablet Take 1 tablet (500 mg total) by mouth 2 (two) times daily. 60 tablet 5  . cephALEXin (KEFLEX) 500 MG capsule Take 1  capsule (500 mg total) by mouth 3 (three) times daily. (Patient not taking: Reported on 06/14/2015) 30 capsule 0   No current facility-administered medications for this visit.    Functional Status:  In your present state of health, do you have any difficulty performing the following activities: 06/14/2015 05/16/2015  Hearing? Tempie Donning  Vision? N N  Difficulty concentrating or making decisions? N N  Walking or climbing stairs? Y Y  Dressing or bathing? N N  Doing errands, shopping? N N  Preparing Food and eating ? N -  Using the Toilet? N -  In the past six months, have you accidently leaked urine? - -  Do you have problems with loss of bowel control? - -  Managing your Medications? N -  Managing your Finances? N -  Housekeeping  or managing your Housekeeping? N -   THN CM Care Plan Problem One        Most Recent Value   Care Plan Problem One  Elevated A1C, measured at over 9 on 02-28-15   Role Documenting the Problem One  [place] for Problem One  Active   THN Long Term Goal (31-90 days)  A1C will decrease by 2 points within 90 days.   THN Long Term Goal Start Date  03/15/15   [facility] Long Term Goal Met Date  05/16/15   Interventions for Problem One Long Term Goal  Will have A1C in December. [Patient did go for diabetic class.  Education reinforced.]   THN CM Short Term Goal #1 (0-30 days)  Improve average fasting blood sugars to 100 or less within 30 days.   THN CM Short Term Goal #1 Start Date  03/15/15   Interventions for Short Term Goal #1  Reinforced goal of fasting cbg below 100.   THN CM Short Term Goal #2 (0-30 days)  Patient will increase activity levels by walking 3-4 times per week within 30 days.   THN CM Short Term Goal #2 Start Date  03/15/15   [facility] CM Short Term Goal #2 Met Date  06/14/15   Interventions for Short Term Goal #2  Is walking 3 times a week.  Positive reinforcement provided.      Assessment: Patient continues to report elevated blood sugars.  Plan: Patient will continue to strive to adher to diabetic dietary guidelines.           Patient will continue walking regimen.           Patient will keep appointment with PCP in December.           RN Health Coach will follow up in approximately one month.

## 2015-06-19 DIAGNOSIS — I10 Essential (primary) hypertension: Secondary | ICD-10-CM | POA: Diagnosis not present

## 2015-06-19 DIAGNOSIS — Z21 Asymptomatic human immunodeficiency virus [HIV] infection status: Secondary | ICD-10-CM | POA: Diagnosis not present

## 2015-06-19 DIAGNOSIS — N183 Chronic kidney disease, stage 3 (moderate): Secondary | ICD-10-CM | POA: Diagnosis not present

## 2015-06-19 DIAGNOSIS — D631 Anemia in chronic kidney disease: Secondary | ICD-10-CM | POA: Diagnosis not present

## 2015-06-19 DIAGNOSIS — N2581 Secondary hyperparathyroidism of renal origin: Secondary | ICD-10-CM | POA: Diagnosis not present

## 2015-06-22 ENCOUNTER — Ambulatory Visit (INDEPENDENT_AMBULATORY_CARE_PROVIDER_SITE_OTHER): Payer: Medicare Other | Admitting: Family

## 2015-06-22 ENCOUNTER — Encounter: Payer: Self-pay | Admitting: Family

## 2015-06-22 VITALS — BP 130/64 | HR 77 | Temp 97.8°F | Resp 18 | Ht 63.0 in | Wt 132.2 lb

## 2015-06-22 DIAGNOSIS — J069 Acute upper respiratory infection, unspecified: Secondary | ICD-10-CM | POA: Diagnosis not present

## 2015-06-22 MED ORDER — PROMETHAZINE-CODEINE 6.25-10 MG/5ML PO SYRP: 5.0000 mL | ORAL_SOLUTION | Freq: Every evening | ORAL | Status: DC | PRN

## 2015-06-22 NOTE — Progress Notes (Signed)
Subjective:    Patient ID: [name], male    DOB: 1946/07/23, 69 y.o.   MRN: [id number]  Chief Complaint  Patient presents with  . Cough    cough, congestion, drainage x2 days    HPI:  [name] is a 69 y.o. male who  has a past medical history of Arthritis; Diabetes mellitus; History of depression; GERD (gastroesophageal reflux disease); CAD (coronary artery disease); Hypertension; Hyperlipidemia; History of kidney stones; COPD (chronic obstructive pulmonary disease) ([place]); HIV (human immunodeficiency virus infection) ([place]) (1991); Dermatitis (11/27/2012); Nocturia (03/06/2013); Epicondylitis (06/05/2013); Pancreatitis (11/2013); Constipation (05/28/2014); Pain in joint, ankle and foot (01/19/2015); and Chronic kidney disease. and presents today for an acute office visit.   Associated symptoms of cough, congestion and drainage have been going on for about 2 days. Cough was severe enough to wake him from his sleep last evening. Modifying factors include Mucinex, water, and sugar free hard candy which have helped some with his symptoms. Course of the symptoms has worsened since onset.   No Known Allergies   Current Outpatient Prescriptions on File Prior to Visit  Medication Sig Dispense Refill  . amLODipine (NORVASC) 10 MG tablet Take 1 tablet (10 mg total) by mouth at bedtime. 90 tablet 1  . aspirin 81 MG chewable tablet Chew 81 mg by mouth daily.    Marland Kitchen atorvastatin (LIPITOR) 20 MG tablet Take 1 tablet (20 mg total) by mouth daily. 30 tablet 3  . b complex vitamins tablet Take 1 tablet by mouth daily. 30 tablet 11  . cefdinir (OMNICEF) 300 MG capsule Take 1 capsule (300 mg total) by mouth 2 (two) times daily. 20 capsule 0  . Cholecalciferol 2000 UNITS CAPS Take 1 capsule (2,000 Units total) by mouth daily. 30 each 11  . clotrimazole-betamethasone (LOTRISONE) cream Apply 1 application topically 2 (two) times daily. apply to lesion on chest wall 45 g 1  . Darunavir Ethanolate (PREZISTA)  800 MG tablet Take 1 tablet (800 mg total) by mouth daily. 30 tablet 6  . dolutegravir (TIVICAY) 50 MG tablet Take 1 tablet (50 mg total) by mouth daily. 30 tablet 11  . furosemide (LASIX) 20 MG tablet TAKE 2 TABLETS BY MOUTH DAILY AS NEEDED FOR FLUID OR EDEMA 60 tablet 6  . glucose blood (FREESTYLE TEST STRIPS) test strip USE TO CHECK BLOOD SUGAR once daily 50 each 5  . Krill Oil Omega-3 300 MG CAPS Take 1 capsule daily 30 capsule 11  . Lancets (FREESTYLE) lancets Use to check blood sugars 3 times daily. 100 each 5  . lisinopril (PRINIVIL,ZESTRIL) 5 MG tablet Take 1/2 by mouth daily 15 tablet 3  . metolazone (ZAROXOLYN) 2.5 MG tablet TAKE 1 TABLET BY MOUTH EVERY DAY 20 MINUTES PRIOR TO LASIX 30 tablet 6  . NORVIR 100 MG TABS tablet TAKE 1 TABLET BY MOUTH EVERY DAY 30 tablet 6  . omeprazole (PRILOSEC) 20 MG capsule TAKE ONE CAPSULE BY MOUTH DAILY. TAKE 12 HOURS APART FROM EDURANT DOSE. 30 capsule 5  . potassium chloride SA (K-DUR,KLOR-CON) 20 MEQ tablet Take 1 tablet (20 mEq total) by mouth 2 (two) times daily. 60 tablet 6  . Probiotic Product (PROBIOTIC DAILY) CAPS Take 1 by mouth daily 30 capsule 11  . repaglinide (PRANDIN) 2 MG tablet 2 tablets before breakfast, 1 before lunch and 2 before supper 150 tablet 2  . rilpivirine (EDURANT) 25 MG TABS tablet Take 1 tablet (25 mg total) by mouth daily with breakfast. 30 tablet 11  .  sitaGLIPtin (JANUVIA) 50 MG tablet Take 1 tablet (50 mg total) by mouth daily. 30 tablet 3  . sodium bicarbonate 650 MG tablet Take 650 mg by mouth 3 (three) times daily.    . valACYclovir (VALTREX) 500 MG tablet Take 1 tablet (500 mg total) by mouth 2 (two) times daily. 60 tablet 5   No current facility-administered medications on file prior to visit.    Review of Systems  Constitutional: Negative for fever and chills.  HENT: Positive for congestion, sinus pressure and sore throat. Negative for ear pain and facial swelling.   Respiratory: Positive for cough. Negative  for chest tightness and shortness of breath.   Neurological: Negative for headaches.      Objective:    BP 130/64 mmHg  Pulse 77  Temp(Src) 97.8 F (36.6 C) (Oral)  Resp 18  Ht 5\' 3"  (1.6 m)  Wt 132 lb 3.2 oz (59.966 kg)  BMI 23.42 kg/m2  SpO2 98% Nursing note and vital signs reviewed.  Physical Exam  Constitutional: He is oriented to person, place, and time. He appears well-developed and well-nourished. No distress.  HENT:  Right Ear: Hearing, tympanic membrane, external ear and ear canal normal.  Left Ear: Hearing, tympanic membrane, external ear and ear canal normal.  Nose: Nose normal. Right sinus exhibits no maxillary sinus tenderness and no frontal sinus tenderness. Left sinus exhibits no maxillary sinus tenderness and no frontal sinus tenderness.  Mouth/Throat: Uvula is midline, oropharynx is clear and moist and mucous membranes are normal.  Cardiovascular: Normal rate, regular rhythm, normal heart sounds and intact distal pulses.   Pulmonary/Chest: Effort normal and breath sounds normal.  Neurological: He is alert and oriented to person, place, and time.  Skin: Skin is warm and dry.  Psychiatric: He has a normal mood and affect. His behavior is normal. Judgment and thought content normal.       Assessment & Plan:   Problem List Items Addressed This Visit      Respiratory   URI (upper respiratory infection) - Primary    Symptoms and exam consistent with acute upper respiratory infection. Last CD4 count stable. Continue conservative treatment at this time with over-the-counter medications as needed for symptom relief and supportive care. Start promethazine with codeine for cough and sleep. Follow-up in 2-3 days if symptoms worsen or fail to improve with conservative treatment.      Relevant Medications   promethazine-codeine (PHENERGAN WITH CODEINE) 6.25-10 MG/5ML syrup

## 2015-06-22 NOTE — Assessment & Plan Note (Signed)
Symptoms and exam consistent with acute upper respiratory infection. Last CD4 count stable. Continue conservative treatment at this time with over-the-counter medications as needed for symptom relief and supportive care. Start promethazine with codeine for cough and sleep. Follow-up in 2-3 days if symptoms worsen or fail to improve with conservative treatment.

## 2015-06-22 NOTE — Patient Instructions (Addendum)
Thank you for choosing [facility].  Summary/Instructions:  Your prescription(s) have been submitted to your pharmacy or been printed and provided for you. Please take as directed and contact our office if you believe you are having problem(s) with the medication(s) or have any questions.  If your symptoms worsen or fail to improve, please contact our office for further instruction, or in case of emergency go directly to the emergency room at the closest medical facility.    General Recommendations:    Please drink plenty of fluids.  Get plenty of rest   Sleep in humidified air  Use saline nasal sprays  Netti pot   OTC Medications:  Decongestants - helps relieve congestion   Flonase (generic fluticasone) or Nasacort (generic triamcinolone) - please make sure to use the "cross-over" technique at a 45 degree angle towards the opposite eye as opposed to straight up the nasal passageway.   Sudafed (generic pseudoephedrine - Note this is the one that is available behind the pharmacy counter); Products with phenylephrine (-PE) may also be used but is often not as effective as pseudoephedrine.   If you have HIGH BLOOD PRESSURE - Coricidin HBP; AVOID any product that is -D as this contains pseudoephedrine which may increase your blood pressure.  Afrin (oxymetazoline) every 6-8 hours for up to 3 days.   Allergies - helps relieve runny nose, itchy eyes and sneezing   Claritin (generic loratidine), Allegra (fexofenidine), or Zyrtec (generic cyrterizine) for runny nose. These medications should not cause drowsiness.  Note - Benadryl (generic diphenhydramine) may be used however may cause drowsiness  Cough -   Delsym or Robitussin (generic dextromethorphan)  Expectorants - helps loosen mucus to ease removal   Mucinex (generic guaifenesin) as directed on the package.  Headaches / General Aches   Tylenol (generic acetaminophen) - DO NOT EXCEED 3 grams (3,000 mg) in a 24  hour time period  Advil/Motrin (generic ibuprofen)   Sore Throat -   Salt water gargle   Chloraseptic (generic benzocaine) spray or lozenges / Sucrets (generic dyclonine)    Upper Respiratory Infection, Adult Most upper respiratory infections (URIs) are a viral infection of the air passages leading to the lungs. A URI affects the nose, throat, and upper air passages. The most common type of URI is nasopharyngitis and is typically referred to as "the common cold." URIs run their course and usually go away on their own. Most of the time, a URI does not require medical attention, but sometimes a bacterial infection in the upper airways can follow a viral infection. This is called a secondary infection. Sinus and middle ear infections are common types of secondary upper respiratory infections. Bacterial pneumonia can also complicate a URI. A URI can worsen asthma and chronic obstructive pulmonary disease (COPD). Sometimes, these complications can require emergency medical care and may be life threatening.  CAUSES Almost all URIs are caused by viruses. A virus is a type of germ and can spread from one person to another.  RISKS FACTORS You may be at risk for a URI if:   You smoke.   You have chronic heart or lung disease.  You have a weakened defense (immune) system.   You are very young or very old.   You have nasal allergies or asthma.  You work in crowded or poorly ventilated areas.  You work in health care facilities or schools. SIGNS AND SYMPTOMS  Symptoms typically develop 2-3 days after you come in contact with a cold virus. Most   viral URIs last 7-10 days. However, viral URIs from the influenza virus (flu virus) can last 14-18 days and are typically more severe. Symptoms may include:   Runny or stuffy (congested) nose.   Sneezing.   Cough.   Sore throat.   Headache.   Fatigue.   Fever.   Loss of appetite.   Pain in your forehead, behind your eyes, and  over your cheekbones (sinus pain).  Muscle aches.  DIAGNOSIS  Your health care provider may diagnose a URI by:  Physical exam.  Tests to check that your symptoms are not due to another condition such as:  Strep throat.  Sinusitis.  Pneumonia.  Asthma. TREATMENT  A URI goes away on its own with time. It cannot be cured with medicines, but medicines may be prescribed or recommended to relieve symptoms. Medicines may help:  Reduce your fever.  Reduce your cough.  Relieve nasal congestion. HOME CARE INSTRUCTIONS   Take medicines only as directed by your health care provider.   Gargle warm saltwater or take cough drops to comfort your throat as directed by your health care provider.  Use a warm mist humidifier or inhale steam from a shower to increase air moisture. This may make it easier to breathe.  Drink enough fluid to keep your urine clear or pale yellow.   Eat soups and other clear broths and maintain good nutrition.   Rest as needed.   Return to work when your temperature has returned to normal or as your health care provider advises. You may need to stay home longer to avoid infecting others. You can also use a face mask and careful hand washing to prevent spread of the virus.  Increase the usage of your inhaler if you have asthma.   Do not use any tobacco products, including cigarettes, chewing tobacco, or electronic cigarettes. If you need help quitting, ask your health care provider. PREVENTION  The best way to protect yourself from getting a cold is to practice good hygiene.   Avoid oral or hand contact with people with cold symptoms.   Wash your hands often if contact occurs.  There is no clear evidence that vitamin C, vitamin E, echinacea, or exercise reduces the chance of developing a cold. However, it is always recommended to get plenty of rest, exercise, and practice good nutrition.  SEEK MEDICAL CARE IF:   You are getting worse rather than  better.   Your symptoms are not controlled by medicine.   You have chills.  You have worsening shortness of breath.  You have brown or red mucus.  You have yellow or brown nasal discharge.  You have pain in your face, especially when you bend forward.  You have a fever.  You have swollen neck glands.  You have pain while swallowing.  You have white areas in the back of your throat. SEEK IMMEDIATE MEDICAL CARE IF:   You have severe or persistent:  Headache.  Ear pain.  Sinus pain.  Chest pain.  You have chronic lung disease and any of the following:  Wheezing.  Prolonged cough.  Coughing up blood.  A change in your usual mucus.  You have a stiff neck.  You have changes in your:  Vision.  Hearing.  Thinking.  Mood. MAKE SURE YOU:   Understand these instructions.  Will watch your condition.  Will get help right away if you are not doing well or get worse.   This information is not intended to replace advice   given to you by your health care provider. Make sure you discuss any questions you have with your health care provider.   Document Released: 01/14/2001 Document Revised: 12/05/2014 Document Reviewed: 10/26/2013 Elsevier Interactive Patient Education 2016 [employer] Inc.  

## 2015-06-22 NOTE — Progress Notes (Signed)
Pre visit review using our clinic review tool, if applicable. No additional management support is needed unless otherwise documented below in the visit note. 

## 2015-06-25 ENCOUNTER — Telehealth: Payer: Self-pay | Admitting: Family

## 2015-06-25 ENCOUNTER — Other Ambulatory Visit: Payer: Self-pay | Admitting: Endocrinology

## 2015-06-25 DIAGNOSIS — E876 Hypokalemia: Secondary | ICD-10-CM | POA: Diagnosis not present

## 2015-06-25 MED ORDER — CEFUROXIME AXETIL 500 MG PO TABS: 500.0000 mg | ORAL_TABLET | Freq: Two times a day (BID) | ORAL | Status: DC

## 2015-06-25 NOTE — Telephone Encounter (Signed)
Please advise 

## 2015-06-25 NOTE — Telephone Encounter (Signed)
Medication has been sent to pharmacy.  °

## 2015-06-25 NOTE — Telephone Encounter (Signed)
Patient states that Marya Amsler told him to call today if not better.  Patient states he is not better.  He is now coughing up green mucus.  Patient uses [facility] on [address].

## 2015-06-25 NOTE — Telephone Encounter (Signed)
Pt aware.

## 2015-07-03 DIAGNOSIS — B20 Human immunodeficiency virus [HIV] disease: Secondary | ICD-10-CM | POA: Diagnosis not present

## 2015-07-03 DIAGNOSIS — K6282 Dysplasia of anus: Secondary | ICD-10-CM | POA: Diagnosis not present

## 2015-07-03 DIAGNOSIS — Z21 Asymptomatic human immunodeficiency virus [HIV] infection status: Secondary | ICD-10-CM | POA: Diagnosis not present

## 2015-07-03 DIAGNOSIS — D013 Carcinoma in situ of anus and anal canal: Secondary | ICD-10-CM | POA: Diagnosis not present

## 2015-07-03 DIAGNOSIS — Z006 Encounter for examination for normal comparison and control in clinical research program: Secondary | ICD-10-CM | POA: Diagnosis not present

## 2015-07-03 DIAGNOSIS — R85612 Low grade squamous intraepithelial lesion on cytologic smear of anus (LGSIL): Secondary | ICD-10-CM | POA: Diagnosis not present

## 2015-07-06 NOTE — Progress Notes (Signed)
HPI: FU coronary artery disease. Patient is status post coronary artery bypass and graft in 2003. Myoview in February 2013 showed an ejection fraction of 67% and normal perfusion. Echocardiogram in August 2015 showed normal LV function. Patient was admitted August 2015 with weakness and found to be in respiratory failure, acute renal failure and also had pancreatitis. Patient required transient dialysis. Since last seen, the patient has dyspnea with more extreme activities but not with routine activities. It is relieved with rest. It is not associated with chest pain. There is no orthopnea, PND or pedal edema. There is no syncope or palpitations. There is no exertional chest pain.   Current Outpatient Prescriptions  Medication Sig Dispense Refill  . amLODipine (NORVASC) 10 MG tablet Take 1 tablet (10 mg total) by mouth at bedtime. 90 tablet 1  . aspirin 81 MG chewable tablet Chew 81 mg by mouth daily.    Marland Kitchen atorvastatin (LIPITOR) 20 MG tablet Take 1 tablet (20 mg total) by mouth daily. 30 tablet 3  . b complex vitamins tablet Take 1 tablet by mouth daily. 30 tablet 11  . Cholecalciferol 2000 UNITS CAPS Take 1 capsule (2,000 Units total) by mouth daily. 30 each 11  . clotrimazole-betamethasone (LOTRISONE) cream Apply 1 application topically 2 (two) times daily. apply to lesion on chest wall 45 g 1  . Darunavir Ethanolate (PREZISTA) 800 MG tablet Take 1 tablet (800 mg total) by mouth daily. 30 tablet 6  . dolutegravir (TIVICAY) 50 MG tablet Take 1 tablet (50 mg total) by mouth daily. 30 tablet 11  . furosemide (LASIX) 20 MG tablet TAKE 2 TABLETS BY MOUTH DAILY AS NEEDED FOR FLUID OR EDEMA 60 tablet 6  . glucose blood (FREESTYLE TEST STRIPS) test strip USE TO CHECK BLOOD SUGAR once daily 50 each 5  . Krill Oil Omega-3 300 MG CAPS Take 1 capsule daily 30 capsule 11  . Lancets (FREESTYLE) lancets Use to check blood sugars 3 times daily. 100 each 5  . lisinopril (PRINIVIL,ZESTRIL) 5 MG tablet  Take 1/2 by mouth daily 15 tablet 3  . metolazone (ZAROXOLYN) 2.5 MG tablet TAKE 1 TABLET BY MOUTH EVERY DAY 20 MINUTES PRIOR TO LASIX 30 tablet 6  . NORVIR 100 MG TABS tablet TAKE 1 TABLET BY MOUTH EVERY DAY 30 tablet 6  . omeprazole (PRILOSEC) 20 MG capsule TAKE ONE CAPSULE BY MOUTH DAILY. TAKE 12 HOURS APART FROM EDURANT DOSE. 30 capsule 5  . potassium chloride SA (K-DUR,KLOR-CON) 20 MEQ tablet Take 1 tablet (20 mEq total) by mouth 2 (two) times daily. 60 tablet 6  . Probiotic Product (PROBIOTIC DAILY) CAPS Take 1 by mouth daily 30 capsule 11  . promethazine-codeine (PHENERGAN WITH CODEINE) 6.25-10 MG/5ML syrup Take 5 mLs by mouth at bedtime as needed for cough. 120 mL 0  . repaglinide (PRANDIN) 2 MG tablet TAKE 2 TABLETS BY MOUTH BEFORE BREAKFAST, 1 TABLET BY MOUTH BEFORE LUNCH AND 2 TABLETS BY MOUTH BEFORE SUPPER 150 tablet 1  . rilpivirine (EDURANT) 25 MG TABS tablet Take 1 tablet (25 mg total) by mouth daily with breakfast. 30 tablet 11  . sitaGLIPtin (JANUVIA) 50 MG tablet Take 1 tablet (50 mg total) by mouth daily. 30 tablet 3  . sodium bicarbonate 650 MG tablet Take 650 mg by mouth 3 (three) times daily.    . valACYclovir (VALTREX) 500 MG tablet Take 1 tablet (500 mg total) by mouth 2 (two) times daily. 60 tablet 5   No current facility-administered  medications for this visit.     Past Medical History  Diagnosis Date  . Arthritis   . Diabetes mellitus   . History of depression   . GERD (gastroesophageal reflux disease)   . CAD (coronary artery disease)     2003- cabg  . Hypertension   . Hyperlipidemia   . History of kidney stones   . COPD (chronic obstructive pulmonary disease) ([place])   . HIV (human immunodeficiency virus infection) ([place]) 1991    on meds since initial dx.   . Dermatitis 11/27/2012  . Nocturia 03/06/2013  . Epicondylitis 06/05/2013    right  . Pancreatitis 11/2013    attributed to HIV meds.   . Constipation 05/28/2014  . Pain in joint, ankle and foot  01/19/2015  . Chronic kidney disease     Past Surgical History  Procedure Laterality Date  . Coronary artery bypass graft  05/2002  . Vasectomy      Social History   Social History  . Marital Status: Divorced    Spouse Name: N/A  . Number of Children: 4  . Years of Education: N/A   Occupational History  . 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    Social History Main Topics  . Smoking status: Never Smoker   . Smokeless tobacco: Never Used  . Alcohol Use: No  . Drug Use: No  . Sexual Activity: Not Currently     Comment: declined condoms   Other Topics Concern  . Not on file   Social History Narrative   Lives alone.  Supportive friends and family.  His HIV Dx is not a secret.     ROS: no fevers or chills, productive cough, hemoptysis, dysphasia, odynophagia, melena, hematochezia, dysuria, hematuria, rash, seizure activity, orthopnea, PND, pedal edema, claudication. Remaining systems are negative.  Physical Exam: Well-developed well-nourished in no acute distress.  Skin is warm and dry.  HEENT is normal.  Neck is supple. Left carotid bruit Chest is clear to auscultation with normal expansion.  Cardiovascular exam is regular rate and rhythm.  Abdominal exam nontender or distended. No masses palpated. Extremities show no edema. neuro grossly intact  ECG Sinus rhythm at a rate of 74. Nonspecific ST changes.

## 2015-07-10 ENCOUNTER — Encounter: Payer: Self-pay | Admitting: *Deleted

## 2015-07-10 ENCOUNTER — Encounter: Payer: Self-pay | Admitting: Cardiology

## 2015-07-10 ENCOUNTER — Other Ambulatory Visit: Payer: Self-pay

## 2015-07-10 ENCOUNTER — Ambulatory Visit (INDEPENDENT_AMBULATORY_CARE_PROVIDER_SITE_OTHER): Payer: Medicare Other | Admitting: Cardiology

## 2015-07-10 VITALS — BP 100/50 | HR 74 | Ht 63.0 in | Wt 128.4 lb

## 2015-07-10 DIAGNOSIS — E785 Hyperlipidemia, unspecified: Secondary | ICD-10-CM

## 2015-07-10 DIAGNOSIS — R0989 Other specified symptoms and signs involving the circulatory and respiratory systems: Secondary | ICD-10-CM | POA: Diagnosis not present

## 2015-07-10 DIAGNOSIS — I2510 Atherosclerotic heart disease of native coronary artery without angina pectoris: Secondary | ICD-10-CM

## 2015-07-10 HISTORY — DX: Other specified symptoms and signs involving the circulatory and respiratory systems: R09.89

## 2015-07-10 MED ORDER — ATORVASTATIN CALCIUM 20 MG PO TABS: 20.0000 mg | ORAL_TABLET | Freq: Every day | ORAL | Status: DC

## 2015-07-10 NOTE — Assessment & Plan Note (Signed)
Blood pressure controlled. Continue present medications. 

## 2015-07-10 NOTE — Assessment & Plan Note (Signed)
Continue aspirin and statin. 

## 2015-07-10 NOTE — Assessment & Plan Note (Signed)
Schedule carotid Dopplers. 

## 2015-07-10 NOTE — Assessment & Plan Note (Addendum)
Given documented coronary artery disease I will increase Lipitor to 80 mg daily. Check bmet, lipids and liver in 4 weeks.

## 2015-07-10 NOTE — Patient Instructions (Signed)
Medication Instructions:   INCREASE ATORVASTATIN TO 80 MG ONCE DAILY= 4 OF THE 20 MG TABLETS ONCE DAILY  Labwork:  Your physician recommends that you return for lab work in:4 WEEKS= DO NOT EAT PRIOR TO LAB WORK  Testing/Procedures:  Your physician has requested that you have a carotid duplex. This test is an ultrasound of the carotid arteries in your neck. It looks at blood flow through these arteries that supply the brain with blood. Allow one hour for this exam. There are no restrictions or special instructions.    Follow-Up:  Your physician wants you to follow-up in: [place] will receive a reminder letter in the mail two months in advance. If you don't receive a letter, please call our office to schedule the follow-up appointment.   If you need a refill on your cardiac medications before your next appointment, please call your pharmacy.

## 2015-07-10 NOTE — Telephone Encounter (Signed)
Rx(s) sent to pharmacy electronically.  

## 2015-07-11 MED ORDER — ATORVASTATIN CALCIUM 80 MG PO TABS: 80.0000 mg | ORAL_TABLET | Freq: Every day | ORAL | Status: DC

## 2015-07-17 ENCOUNTER — Ambulatory Visit (INDEPENDENT_AMBULATORY_CARE_PROVIDER_SITE_OTHER): Payer: Medicare Other | Admitting: Family Medicine

## 2015-07-17 ENCOUNTER — Encounter: Payer: Self-pay | Admitting: Family Medicine

## 2015-07-17 VITALS — BP 110/62 | HR 75 | Temp 97.6°F | Ht 63.0 in | Wt 128.1 lb

## 2015-07-17 DIAGNOSIS — N649 Disorder of breast, unspecified: Secondary | ICD-10-CM | POA: Diagnosis not present

## 2015-07-17 DIAGNOSIS — I2510 Atherosclerotic heart disease of native coronary artery without angina pectoris: Secondary | ICD-10-CM

## 2015-07-17 DIAGNOSIS — I10 Essential (primary) hypertension: Secondary | ICD-10-CM | POA: Diagnosis not present

## 2015-07-17 DIAGNOSIS — H60393 Other infective otitis externa, bilateral: Secondary | ICD-10-CM

## 2015-07-17 DIAGNOSIS — K219 Gastro-esophageal reflux disease without esophagitis: Secondary | ICD-10-CM

## 2015-07-17 MED ORDER — NEOMYCIN-POLYMYXIN-HC 3.5-10000-1 OT SOLN: 3.0000 [drp] | Freq: Three times a day (TID) | OTIC | Status: DC

## 2015-07-17 NOTE — Progress Notes (Signed)
Pre visit review using our clinic review tool, if applicable. No additional management support is needed unless otherwise documented below in the visit note. 

## 2015-07-17 NOTE — Patient Instructions (Signed)

## 2015-07-18 ENCOUNTER — Ambulatory Visit (HOSPITAL_COMMUNITY)
Admission: RE | Admit: 2015-07-18 | Discharge: 2015-07-18 | Disposition: A | Discharge status: Home / Self Care | Payer: Medicare Other | Source: Ambulatory Visit | Attending: Cardiology | Admitting: Cardiology

## 2015-07-18 ENCOUNTER — Other Ambulatory Visit: Payer: Self-pay

## 2015-07-18 DIAGNOSIS — E785 Hyperlipidemia, unspecified: Secondary | ICD-10-CM | POA: Insufficient documentation

## 2015-07-18 DIAGNOSIS — I6523 Occlusion and stenosis of bilateral carotid arteries: Secondary | ICD-10-CM | POA: Insufficient documentation

## 2015-07-18 DIAGNOSIS — I129 Hypertensive chronic kidney disease with stage 1 through stage 4 chronic kidney disease, or unspecified chronic kidney disease: Secondary | ICD-10-CM | POA: Diagnosis not present

## 2015-07-18 DIAGNOSIS — E119 Type 2 diabetes mellitus without complications: Secondary | ICD-10-CM | POA: Insufficient documentation

## 2015-07-18 DIAGNOSIS — R0989 Other specified symptoms and signs involving the circulatory and respiratory systems: Secondary | ICD-10-CM | POA: Insufficient documentation

## 2015-07-18 DIAGNOSIS — B20 Human immunodeficiency virus [HIV] disease: Secondary | ICD-10-CM | POA: Insufficient documentation

## 2015-07-18 DIAGNOSIS — N189 Chronic kidney disease, unspecified: Secondary | ICD-10-CM | POA: Insufficient documentation

## 2015-07-18 NOTE — Patient Outreach (Signed)
[facility]) Care Management  07/18/2015  ZARRAR VOGELSANG 1946-01-16 [id number]  Unsuccessful attempt to reach patient for scheduled appointment.  Message left requesting call back. EPIC EMR indicates patient may be at MD appointment.  RN Health Coach will attempt to call patient within 5 working days.  Candie Mile, RN, MSN [place] [phone] Fax [phone]

## 2015-07-19 ENCOUNTER — Telehealth: Payer: Self-pay | Admitting: *Deleted

## 2015-07-19 ENCOUNTER — Other Ambulatory Visit: Payer: Self-pay

## 2015-07-19 NOTE — Patient Outreach (Signed)
[facility]) Care Management  07/19/2015  [name] [birth year] [id number]   Telephone contact with patient today.  He reports he is doing well, except for an ear infection for which he is being treated.  Saw PCP on 07-17-15, and has appt. next week with Dr. Dwyane Dee, when his A1C will most likely be checked.  Patient reports cbgs remain around 120, and occasionally go up to 200 if non-fasting.  He reports making healthy food choices.  Weight is steady at 128 pounds.  Discussed patient goal of lowering fasting cbg levels to under 100.  Patient states it will be easier to work on this after the holidays.  Plan:  Patient will keep MD appointments.           Patient will continue to monitor cbgs.           RN Health Coach will follow up in approximately one month.  Candie Mile, RN, MSN [place] [phone] Fax [phone]

## 2015-07-19 NOTE — Telephone Encounter (Signed)
-----   Message from Lelon Perla, MD sent at 07/19/2015  7:55 AM EST ----- No significant plaque [place]

## 2015-07-19 NOTE — Telephone Encounter (Signed)
Spoke to patient. Result given . Verbalized understanding  

## 2015-07-21 ENCOUNTER — Encounter: Payer: Self-pay | Admitting: Family Medicine

## 2015-07-21 DIAGNOSIS — N649 Disorder of breast, unspecified: Secondary | ICD-10-CM | POA: Insufficient documentation

## 2015-07-21 DIAGNOSIS — H60399 Other infective otitis externa, unspecified ear: Secondary | ICD-10-CM | POA: Insufficient documentation

## 2015-07-21 HISTORY — DX: Disorder of breast, unspecified: N64.9

## 2015-07-21 NOTE — Assessment & Plan Note (Signed)
Started on antibiotics drops and report if no improvement

## 2015-07-21 NOTE — Assessment & Plan Note (Signed)
Well controlled, no changes to meds. Encouraged heart healthy diet such as the DASH diet and exercise as tolerated.  °

## 2015-07-21 NOTE — Progress Notes (Signed)
Subjective:    Patient ID: [name], male    DOB: 1946/05/15, 69 y.o.   MRN: [id number]  Chief Complaint  Patient presents with  . Follow-up    HPI Patient is in today for evaluation of several concerns. First is he has noted a tender firm lesion under his left areole of 4 couple of months. He believes it is growing slowly. It is only mildly tender. No warmth or redness. Is also complaining of some sharp pains, itching and crusting in his left ear. The pain is anterior to the ear most notably. It is been worsening over 2 days. No fevers or chills. Denies CP/palp/SOB/HA/congestion/fevers/GI or GU c/o. Taking meds as prescribed  Past Medical History  Diagnosis Date  . Arthritis   . Diabetes mellitus   . History of depression   . GERD (gastroesophageal reflux disease)   . CAD (coronary artery disease)     2003- cabg  . Hypertension   . Hyperlipidemia   . History of kidney stones   . COPD (chronic obstructive pulmonary disease) ([place])   . HIV (human immunodeficiency virus infection) ([place]) 1991    on meds since initial dx.   . Dermatitis 11/27/2012  . Nocturia 03/06/2013  . Epicondylitis 06/05/2013    right  . Pancreatitis 11/2013    attributed to HIV meds.   . Constipation 05/28/2014  . Pain in joint, ankle and foot 01/19/2015  . Chronic kidney disease     Past Surgical History  Procedure Laterality Date  . Coronary artery bypass graft  05/2002  . Vasectomy      Family History  Problem Relation Age of Onset  . Arthritis      mother/father/paternal grandparents  . Breast cancer Maternal Aunt     paternal aunt  . Lung cancer Maternal Aunt   . Hyperlipidemia Mother   . Hyperlipidemia Father   . Heart disease      parents/maternal grandparents/ 2 brothers  . Stroke Paternal Grandmother   . Hypertension Father     paternal grandmother/3 brothers/1 sister  . Mental retardation Sister   . Diabetes Mother     paternal grandparents/1 brother    Social History    Social History  . Marital Status: Divorced    Spouse Name: N/A  . Number of Children: 4  . Years of Education: N/A   Occupational History  . 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    Social History Main Topics  . Smoking status: Never Smoker   . Smokeless tobacco: Never Used  . Alcohol Use: No  . Drug Use: No  . Sexual Activity: Not Currently     Comment: declined condoms   Other Topics Concern  . Not on file   Social History Narrative   Lives alone.  Supportive friends and family.  His HIV Dx is not a secret.     Outpatient Prescriptions Prior to Visit  Medication Sig Dispense Refill  . amLODipine (NORVASC) 10 MG tablet Take 1 tablet (10 mg total) by mouth at bedtime. 90 tablet 1  . aspirin 81 MG chewable tablet Chew 81 mg by mouth daily.    Marland Kitchen atorvastatin (LIPITOR) 80 MG tablet Take 1 tablet (80 mg total) by mouth daily. 90 tablet 3  . b complex vitamins tablet Take 1 tablet by mouth daily. 30 tablet 11  . Cholecalciferol 2000 UNITS CAPS Take 1 capsule (2,000 Units total) by mouth daily. [place]  each 11  . clotrimazole-betamethasone (LOTRISONE) cream Apply 1 application topically 2 (two) times daily. apply to lesion on chest wall 45 g 1  . Darunavir Ethanolate (PREZISTA) 800 MG tablet Take 1 tablet (800 mg total) by mouth daily. 30 tablet 6  . dolutegravir (TIVICAY) 50 MG tablet Take 1 tablet (50 mg total) by mouth daily. 30 tablet 11  . furosemide (LASIX) 20 MG tablet TAKE 2 TABLETS BY MOUTH DAILY AS NEEDED FOR FLUID OR EDEMA 60 tablet 6  . glucose blood (FREESTYLE TEST STRIPS) test strip USE TO CHECK BLOOD SUGAR once daily 50 each 5  . Krill Oil Omega-3 300 MG CAPS Take 1 capsule daily 30 capsule 11  . Lancets (FREESTYLE) lancets Use to check blood sugars 3 times daily. 100 each 5  . lisinopril (PRINIVIL,ZESTRIL) 5 MG tablet Take 1/2 by mouth daily 15 tablet 3  . metolazone (ZAROXOLYN) 2.5 MG tablet TAKE 1 TABLET BY MOUTH EVERY DAY 20  MINUTES PRIOR TO LASIX 30 tablet 6  . NORVIR 100 MG TABS tablet TAKE 1 TABLET BY MOUTH EVERY DAY 30 tablet 6  . omeprazole (PRILOSEC) 20 MG capsule TAKE ONE CAPSULE BY MOUTH DAILY. TAKE 12 HOURS APART FROM EDURANT DOSE. 30 capsule 5  . potassium chloride SA (K-DUR,KLOR-CON) 20 MEQ tablet Take 1 tablet (20 mEq total) by mouth 2 (two) times daily. 60 tablet 6  . Probiotic Product (PROBIOTIC DAILY) CAPS Take 1 by mouth daily 30 capsule 11  . promethazine-codeine (PHENERGAN WITH CODEINE) 6.25-10 MG/5ML syrup Take 5 mLs by mouth at bedtime as needed for cough. 120 mL 0  . repaglinide (PRANDIN) 2 MG tablet TAKE 2 TABLETS BY MOUTH BEFORE BREAKFAST, 1 TABLET BY MOUTH BEFORE LUNCH AND 2 TABLETS BY MOUTH BEFORE SUPPER 150 tablet 1  . rilpivirine (EDURANT) 25 MG TABS tablet Take 1 tablet (25 mg total) by mouth daily with breakfast. 30 tablet 11  . sitaGLIPtin (JANUVIA) 50 MG tablet Take 1 tablet (50 mg total) by mouth daily. 30 tablet 3  . sodium bicarbonate 650 MG tablet Take 650 mg by mouth 3 (three) times daily.    . valACYclovir (VALTREX) 500 MG tablet Take 1 tablet (500 mg total) by mouth 2 (two) times daily. 60 tablet 5   No facility-administered medications prior to visit.    No Known Allergies  Review of Systems  Constitutional: Positive for malaise/fatigue. Negative for fever.  HENT: Positive for congestion, ear discharge and ear pain.   Eyes: Negative for discharge.  Respiratory: Negative for shortness of breath.   Cardiovascular: Positive for chest pain. Negative for palpitations and leg swelling.  Gastrointestinal: Negative for nausea and abdominal pain.  Genitourinary: Negative for dysuria.  Musculoskeletal: Negative for falls.  Skin: Negative for rash.  Neurological: Negative for loss of consciousness and headaches.  Endo/Heme/Allergies: Negative for environmental allergies.  Psychiatric/Behavioral: Negative for depression. The patient is not nervous/anxious.        Objective:      Physical Exam  Constitutional: He is oriented to person, place, and time. He appears well-developed and well-nourished. No distress.  HENT:  Head: Normocephalic and atraumatic.  Nose: Nose normal.  B/l TMs mildly erythematous. Left canal swollen and crusty, mild erythema  Eyes: Right eye exhibits no discharge. Left eye exhibits no discharge.  Neck: Normal range of motion. Neck supple.  Cardiovascular: Normal rate and regular rhythm.   No murmur heard. Pulmonary/Chest: Effort normal and breath sounds normal.  Abdominal: Soft. Bowel sounds are normal. There is no  tenderness.  Genitourinary:  Left breast with a 1-2 x 3 cm firm, circumscribed lesion under left areola when reclining. Mildly tender to palpation. No warmth or fluctuance.  Musculoskeletal: He exhibits no edema.  Neurological: He is alert and oriented to person, place, and time.  Skin: Skin is warm and dry.  Psychiatric: He has a normal mood and affect.  Nursing note and vitals reviewed.   BP 110/62 mmHg  Pulse 75  Temp(Src) 97.6 F (36.4 C) (Oral)  Ht 5\' 3"  (1.6 m)  Wt 128 lb 2 oz (58.117 kg)  BMI 22.70 kg/m2  SpO2 97% Wt Readings from Last 3 Encounters:  07/17/15 128 lb 2 oz (58.117 kg)  07/10/15 128 lb 6.4 oz (58.242 kg)  06/22/15 132 lb 3.2 oz (59.966 kg)     Lab Results  Component Value Date   WBC 5.3 04/27/2015   HGB 12.0* 04/27/2015   HCT 34.6* 04/27/2015   PLT 177.0 04/27/2015   GLUCOSE 136* 04/27/2015   CHOL 137 04/27/2015   TRIG 266.0* 04/27/2015   HDL 29.40* 04/27/2015   LDLDIRECT 72.0 04/27/2015   LDLCALC 84 06/23/2014   ALT 20 04/27/2015   AST 18 04/27/2015   NA 138 04/27/2015   K 3.9 04/27/2015   CL 100 04/27/2015   CREATININE 1.76* 04/27/2015   BUN 32* 04/27/2015   CO2 27 04/27/2015   TSH 2.16 04/27/2015   PSA 0.51 03/02/2013   INR 1.32 04/03/2014   HGBA1C 6.8 04/26/2015   MICROALBUR 5.7* 01/19/2015    Lab Results  Component Value Date   TSH 2.16 04/27/2015   Lab Results   Component Value Date   WBC 5.3 04/27/2015   HGB 12.0* 04/27/2015   HCT 34.6* 04/27/2015   MCV 92.6 04/27/2015   PLT 177.0 04/27/2015   Lab Results  Component Value Date   NA 138 04/27/2015   K 3.9 04/27/2015   CO2 27 04/27/2015   GLUCOSE 136* 04/27/2015   BUN 32* 04/27/2015   CREATININE 1.76* 04/27/2015   BILITOT 0.5 04/27/2015   ALKPHOS 69 04/27/2015   AST 18 04/27/2015   ALT 20 04/27/2015   PROT 7.9 04/27/2015   ALBUMIN 4.5 04/27/2015   CALCIUM 9.6 04/27/2015   ANIONGAP 18* 04/10/2014   GFR 41.26* 04/27/2015   Lab Results  Component Value Date   CHOL 137 04/27/2015   Lab Results  Component Value Date   HDL 29.40* 04/27/2015   Lab Results  Component Value Date   LDLCALC 84 06/23/2014   Lab Results  Component Value Date   TRIG 266.0* 04/27/2015   Lab Results  Component Value Date   CHOLHDL 5 04/27/2015   Lab Results  Component Value Date   HGBA1C 6.8 04/26/2015       Assessment & Plan:   Problem List Items Addressed This Visit    Essential hypertension    Well controlled, no changes to meds. Encouraged heart healthy diet such as the DASH diet and exercise as tolerated.       GERD    Avoid offending foods, start probiotics. Do not eat large meals in late evening and consider raising head of bed.       Lesion of breast    Left breast for a couple months. Proceed with imaging       Relevant Orders   US BREAST COMPLETE UNI LEFT INC AXILLA   Otitis, externa, infective - Primary    Started on antibiotics drops and report if no improvement  Relevant Medications   neomycin-polymyxin-hydrocortisone (CORTISPORIN) otic solution      I am having Mr. Mccarry start on neomycin-polymyxin-hydrocortisone. I am also having him maintain his aspirin, sodium bicarbonate, rilpivirine, dolutegravir, clotrimazole-betamethasone, potassium chloride SA, freestyle, glucose blood, NORVIR, metolazone, Darunavir Ethanolate, furosemide, valACYclovir, amLODipine,  lisinopril, sitaGLIPtin, omeprazole, b complex vitamins, Cholecalciferol, Krill Oil Omega-3, PROBIOTIC DAILY, promethazine-codeine, repaglinide, and atorvastatin.  Meds ordered this encounter  Medications  . neomycin-polymyxin-hydrocortisone (CORTISPORIN) otic solution    Sig: Place 3 drops into both ears 3 (three) times daily.    Dispense:  10 mL    Refill:  0     Penni Homans, MD

## 2015-07-21 NOTE — Assessment & Plan Note (Signed)
Left breast for a couple months. Proceed with imaging

## 2015-07-21 NOTE — Assessment & Plan Note (Signed)
Avoid offending foods, start probiotics. Do not eat large meals in late evening and consider raising head of bed.  

## 2015-07-23 ENCOUNTER — Other Ambulatory Visit (INDEPENDENT_AMBULATORY_CARE_PROVIDER_SITE_OTHER): Payer: Medicare Other

## 2015-07-23 DIAGNOSIS — E1165 Type 2 diabetes mellitus with hyperglycemia: Secondary | ICD-10-CM

## 2015-07-23 DIAGNOSIS — IMO0002 Reserved for concepts with insufficient information to code with codable children: Secondary | ICD-10-CM

## 2015-07-23 LAB — HEMOGLOBIN A1C: Hgb A1c MFr Bld: 6.4 % (ref 4.6–6.5)

## 2015-07-23 LAB — BASIC METABOLIC PANEL
BUN: 33 mg/dL — ABNORMAL HIGH (ref 6–23)
CALCIUM: 9.4 mg/dL (ref 8.4–10.5)
CHLORIDE: 99 meq/L (ref 96–112)
CO2: 26 meq/L (ref 19–32)
Creatinine, Ser: 1.7 mg/dL — ABNORMAL HIGH (ref 0.40–1.50)
GFR: 42.92 mL/min — ABNORMAL LOW (ref >60.00)
GLUCOSE: 134 mg/dL — AB (ref 70–99)
POTASSIUM: 3.6 meq/L (ref 3.5–5.1)
SODIUM: 137 meq/L (ref 135–145)

## 2015-07-26 ENCOUNTER — Encounter: Payer: Self-pay | Admitting: Endocrinology

## 2015-07-26 ENCOUNTER — Ambulatory Visit (INDEPENDENT_AMBULATORY_CARE_PROVIDER_SITE_OTHER): Payer: Medicare Other | Admitting: Endocrinology

## 2015-07-26 ENCOUNTER — Ambulatory Visit: Payer: Medicare Other | Admitting: Podiatry

## 2015-07-26 VITALS — BP 110/56 | HR 67 | Temp 97.6°F | Resp 14 | Ht 67.0 in | Wt 126.8 lb

## 2015-07-26 DIAGNOSIS — I2510 Atherosclerotic heart disease of native coronary artery without angina pectoris: Secondary | ICD-10-CM

## 2015-07-26 DIAGNOSIS — E1165 Type 2 diabetes mellitus with hyperglycemia: Secondary | ICD-10-CM | POA: Diagnosis not present

## 2015-07-26 NOTE — Patient Instructions (Signed)
Use a protein in am and avoid cereal  Restart walking  More sugars after lunch or dinner

## 2015-07-26 NOTE — Progress Notes (Signed)
Patient ID: [name], male   DOB: [birth year], 69 y.o.   MRN: [id number]           Reason for Appointment: Follow-up for Type 2 Diabetes  Referring physician: Charlett Blake  History of Present Illness:          Date of diagnosis of type 2 diabetes mellitus: 2013        Background history:  He only mildly increased blood sugar levels at the time of diagnosis and A1c was 7.1  He had been treated initially with metformin but this was stopped subsequently when his renal function was worse  He was subsequently treated with glipizide but his blood sugars had been poorly controlled in 2016 with glipizide alone Since his A1c has been persistently over 9% he was started on Januvia 50 mg in 01/2015 Prior to this he thinks his blood sugars were in the 200-300 range and he was having symptoms of blurred vision and some fatigue  Recent history:   With continued high A1c of 9.5 he was switched from glipizide to Prandin in 02/2015 He thinks he has taken this fairly regularly before each meal, now taking 4 mg before breakfast and supper and 2 mg before lunch. He was also referred to the dietitian   His A1c is now further improved at 6.4  Current blood sugar patterns and problems identified:  He did bring his monitor for download on this visit  He appears to have significantly high readings after breakfast; although previously he was eating eggs and toast at breakfast he has gone back to eating cereal and glucose levels are as high as 301 after breakfast  However despite reminders he has not checked any readings after lunch or dinner  Also because of other issues he has not done his walking for 3 weeks  Has been able to continue his Januvia 50 mg with no issues with insurance coverage  Oral hypoglycemic drugs the patient is taking are: Prandin before meals as above Side effects from medications have been: None  Compliance with the medical regimen: Good, usually trying to take his Prandin before  eating  Hypoglycemia: None    Glucose monitoring:  done usually 1-2  times a day         Glucometer:  FreeStyle     Blood Glucose readings by download:  Mean values apply above for all meters except median for One Touch  PRE-MEAL Fasting Lunch Dinner Bedtime Overall  Glucose range:  105-167   165      Mean/median:  136      198    POST-MEAL PC Breakfast PC Lunch PC Dinner  Glucose range:  196-301  ?   ?  Mean/median:  [place] visit, most recent: 8/15 DIET:  has been trying to reduce fried foods but  eating cereal in the morning               Exercise:  recently no walking, previously was doing this 3/7 days a week, about 1 mile  Weight history:   Wt Readings from Last 3 Encounters:  07/26/15 126 lb 12.8 oz (57.516 kg)  07/17/15 128 lb 2 oz (58.117 kg)  07/10/15 128 lb 6.4 oz (58.242 kg)    Glycemic control:   Lab Results  Component Value Date   HGBA1C 6.4 07/23/2015   HGBA1C 6.8 04/26/2015   HGBA1C 9.5* 01/19/2015   Lab Results  Component Value Date   MICROALBUR 5.7* 01/19/2015  LDLCALC 84 06/23/2014   CREATININE 1.70* 07/23/2015         Medication List       This list is accurate as of: 07/26/15 10:24 AM.  Always use your most recent med list.               amLODipine 10 MG tablet  Commonly known as:  NORVASC  Take 1 tablet (10 mg total) by mouth at bedtime.     aspirin 81 MG chewable tablet  Chew 81 mg by mouth daily.     atorvastatin 80 MG tablet  Commonly known as:  LIPITOR  Take 1 tablet (80 mg total) by mouth daily.     b complex vitamins tablet  Take 1 tablet by mouth daily.     Cholecalciferol 2000 UNITS Caps  Take 1 capsule (2,000 Units total) by mouth daily.     clotrimazole-betamethasone cream  Commonly known as:  LOTRISONE  Apply 1 application topically 2 (two) times daily. apply to lesion on chest wall     Darunavir Ethanolate 800 MG tablet  Commonly known as:  PREZISTA  Take 1 tablet (800 mg total) by mouth  daily.     dolutegravir 50 MG tablet  Commonly known as:  TIVICAY  Take 1 tablet (50 mg total) by mouth daily.     freestyle lancets  Use to check blood sugars 3 times daily.     furosemide 20 MG tablet  Commonly known as:  LASIX  TAKE 2 TABLETS BY MOUTH DAILY AS NEEDED FOR FLUID OR EDEMA     glucose blood test strip  Commonly known as:  FREESTYLE TEST STRIPS  USE TO CHECK BLOOD SUGAR once daily     Krill Oil Omega-3 300 MG Caps  Take 1 capsule daily     lisinopril 5 MG tablet  Commonly known as:  PRINIVIL,ZESTRIL  Take 1/2 by mouth daily     metolazone 2.5 MG tablet  Commonly known as:  ZAROXOLYN  TAKE 1 TABLET BY MOUTH EVERY DAY 20 MINUTES PRIOR TO LASIX     neomycin-polymyxin-hydrocortisone otic solution  Commonly known as:  CORTISPORIN  Place 3 drops into both ears 3 (three) times daily.     NORVIR 100 MG Tabs tablet  Generic drug:  ritonavir  TAKE 1 TABLET BY MOUTH EVERY DAY     omeprazole 20 MG capsule  Commonly known as:  PRILOSEC  TAKE ONE CAPSULE BY MOUTH DAILY. TAKE 12 HOURS APART FROM EDURANT DOSE.     potassium chloride SA 20 MEQ tablet  Commonly known as:  K-DUR,KLOR-CON  Take 1 tablet (20 mEq total) by mouth 2 (two) times daily.     PROBIOTIC DAILY Caps  Take 1 by mouth daily     promethazine-codeine 6.25-10 MG/5ML syrup  Commonly known as:  PHENERGAN with CODEINE  Take 5 mLs by mouth at bedtime as needed for cough.     repaglinide 2 MG tablet  Commonly known as:  PRANDIN  TAKE 2 TABLETS BY MOUTH BEFORE BREAKFAST, 1 TABLET BY MOUTH BEFORE LUNCH AND 2 TABLETS BY MOUTH BEFORE SUPPER     rilpivirine 25 MG Tabs tablet  Commonly known as:  EDURANT  Take 1 tablet (25 mg total) by mouth daily with breakfast.     sitaGLIPtin 50 MG tablet  Commonly known as:  JANUVIA  Take 1 tablet (50 mg total) by mouth daily.     sodium bicarbonate 650 MG tablet  Take 650 mg by mouth 3 (  three) times daily.     valACYclovir 500 MG tablet  Commonly known as:   VALTREX  Take 1 tablet (500 mg total) by mouth 2 (two) times daily.        Allergies: No Known Allergies  Past Medical History  Diagnosis Date  . Arthritis   . Diabetes mellitus   . History of depression   . GERD (gastroesophageal reflux disease)   . CAD (coronary artery disease)     2003- cabg  . Hypertension   . Hyperlipidemia   . History of kidney stones   . COPD (chronic obstructive pulmonary disease) ([place])   . HIV (human immunodeficiency virus infection) ([place]) 1991    on meds since initial dx.   . Dermatitis 11/27/2012  . Nocturia 03/06/2013  . Epicondylitis 06/05/2013    right  . Pancreatitis 11/2013    attributed to HIV meds.   . Constipation 05/28/2014  . Pain in joint, ankle and foot 01/19/2015  . Chronic kidney disease     Past Surgical History  Procedure Laterality Date  . Coronary artery bypass graft  05/2002  . Vasectomy      Family History  Problem Relation Age of Onset  . Arthritis      mother/father/paternal grandparents  . Breast cancer Maternal Aunt     paternal aunt  . Lung cancer Maternal Aunt   . Hyperlipidemia Mother   . Hyperlipidemia Father   . Heart disease      parents/maternal grandparents/ 2 brothers  . Stroke Paternal Grandmother   . Hypertension Father     paternal grandmother/3 brothers/1 sister  . Mental retardation Sister   . Diabetes Mother     paternal grandparents/1 brother    Social History:  reports that he has never smoked. He has never used smokeless tobacco. He reports that he does not drink alcohol or use illicit drugs.    Review of Systems    Lipid history: On Lipitor 10 mg only, followed by PCP    Lab Results  Component Value Date   CHOL 137 04/27/2015   HDL 29.40* 04/27/2015   LDLCALC 84 06/23/2014   LDLDIRECT 72.0 04/27/2015   TRIG 266.0* 04/27/2015   CHOLHDL 5 04/27/2015           Most recent eye exam was nearly a year ago in 2015, reportedly normal   RENAL dysfunction: Followed by nephrologist,  etiology unclear but creatinine is stable  Lab Results  Component Value Date   CREATININE 1.70* 07/23/2015   BUN 33* 07/23/2015   NA 137 07/23/2015   K 3.6 07/23/2015   CL 99 07/23/2015   CO2 26 07/23/2015     Hypertension: Has been treated with various drugs in the past, now taking only amlodipine and low-dose lisinopril  Diabetic foot exam  in 7/16 shows normal monofilament sensation in the toes and plantar surfaces, no skin lesions or ulcers on the feet and practically absent pedal pulses  LABS:  Lab on 07/23/2015  Component Date Value Ref Range Status  . Hgb A1c MFr Bld 07/23/2015 6.4  4.6 - 6.5 % Final   Glycemic Control Guidelines for People with Diabetes:Non Diabetic:  <6%Goal of Therapy: <7%Additional Action Suggested:  >8%   . Sodium 07/23/2015 137  135 - 145 mEq/L Final  . Potassium 07/23/2015 3.6  3.5 - 5.1 mEq/L Final  . Chloride 07/23/2015 99  96 - 112 mEq/L Final  . CO2 07/23/2015 26  19 - 32 mEq/L Final  . Glucose,  Bld 07/23/2015 134* 70 - 99 mg/dL Final  . BUN 07/23/2015 33* 6 - 23 mg/dL Final  . Creatinine, Ser 07/23/2015 1.70* 0.40 - 1.50 mg/dL Final  . Calcium 07/23/2015 9.4  8.4 - 10.5 mg/dL Final  . GFR 07/23/2015 42.92* >60.00 mL/min Final    Physical Examination:  BP 110/56 mmHg  Pulse 67  Temp(Src) 97.6 F (36.4 C)  Resp 14  Ht 5\' 7"  (1.702 m)  Wt 126 lb 12.8 oz (57.516 kg)  BMI 19.85 kg/m2  SpO2 98%            No ankle edema present   ASSESSMENT:  Diabetes type 2, uncontrolled with BMI 23 but has some abdominal obesity    He appears to have fairly good control overall with taking relatively large doses of Prandin along with Januvia A1c is excellent now at 6.4 He does appear to have high readings after breakfast with glucose going up on average about 110 mg after eating cereal Previously was not having as higher reading when eating a more balanced meals with protein  PLAN:   Continue Prandin dose to 4 mg at breakfast and supper and stay  on 2 mg at lunch  He will change his breakfast and discussed sources of protein, may also occasionally oatmeal but check to see what the postprandial readings are  Start checking postprandial readings after lunch and dinner by rotation  Encouraged him to be as active as possible and try to walk indoors if he cannot go out  Continue Januvia also  Follow-up in 3 months   Patient Instructions  Use a protein in am and avoid cereal  Restart walking  More sugars after lunch or dinner    Dvon Jiles 07/26/2015, 10:24 AM   Note: This office note was prepared with Estate agent. Any transcriptional errors that result from this process are unintentional.

## 2015-07-27 ENCOUNTER — Ambulatory Visit: Payer: Medicare Other | Admitting: Podiatry

## 2015-07-29 ENCOUNTER — Inpatient Hospital Stay (HOSPITAL_BASED_OUTPATIENT_CLINIC_OR_DEPARTMENT_OTHER)
Admission: EM | Admit: 2015-07-29 | Discharge: 2015-07-31 | DRG: 246 | Disposition: A | Discharge status: Home / Self Care | Payer: Medicare Other | Attending: Cardiology | Admitting: Cardiology

## 2015-07-29 ENCOUNTER — Ambulatory Visit (HOSPITAL_COMMUNITY): Admit: 2015-07-29 | Payer: Self-pay | Admitting: Cardiology

## 2015-07-29 ENCOUNTER — Encounter (HOSPITAL_BASED_OUTPATIENT_CLINIC_OR_DEPARTMENT_OTHER): Payer: Self-pay | Admitting: *Deleted

## 2015-07-29 ENCOUNTER — Encounter (HOSPITAL_COMMUNITY)
Admission: EM | Disposition: A | Discharge status: Home / Self Care | Payer: Medicare Other | Source: Home / Self Care | Attending: Cardiology

## 2015-07-29 ENCOUNTER — Inpatient Hospital Stay (HOSPITAL_COMMUNITY): Payer: Medicare Other

## 2015-07-29 DIAGNOSIS — R079 Chest pain, unspecified: Secondary | ICD-10-CM | POA: Diagnosis not present

## 2015-07-29 DIAGNOSIS — Z7984 Long term (current) use of oral hypoglycemic drugs: Secondary | ICD-10-CM

## 2015-07-29 DIAGNOSIS — I213 ST elevation (STEMI) myocardial infarction of unspecified site: Secondary | ICD-10-CM

## 2015-07-29 DIAGNOSIS — I129 Hypertensive chronic kidney disease with stage 1 through stage 4 chronic kidney disease, or unspecified chronic kidney disease: Secondary | ICD-10-CM | POA: Diagnosis present

## 2015-07-29 DIAGNOSIS — N183 Chronic kidney disease, stage 3 (moderate): Secondary | ICD-10-CM | POA: Diagnosis present

## 2015-07-29 DIAGNOSIS — I10 Essential (primary) hypertension: Secondary | ICD-10-CM | POA: Diagnosis present

## 2015-07-29 DIAGNOSIS — Z951 Presence of aortocoronary bypass graft: Secondary | ICD-10-CM

## 2015-07-29 DIAGNOSIS — K219 Gastro-esophageal reflux disease without esophagitis: Secondary | ICD-10-CM | POA: Diagnosis present

## 2015-07-29 DIAGNOSIS — Z7982 Long term (current) use of aspirin: Secondary | ICD-10-CM | POA: Diagnosis not present

## 2015-07-29 DIAGNOSIS — I221 Subsequent ST elevation (STEMI) myocardial infarction of inferior wall: Secondary | ICD-10-CM | POA: Diagnosis not present

## 2015-07-29 DIAGNOSIS — I2510 Atherosclerotic heart disease of native coronary artery without angina pectoris: Secondary | ICD-10-CM

## 2015-07-29 DIAGNOSIS — I2111 ST elevation (STEMI) myocardial infarction involving right coronary artery: Secondary | ICD-10-CM

## 2015-07-29 DIAGNOSIS — T82857A Stenosis of cardiac prosthetic devices, implants and grafts, initial encounter: Secondary | ICD-10-CM | POA: Diagnosis present

## 2015-07-29 DIAGNOSIS — Y832 Surgical operation with anastomosis, bypass or graft as the cause of abnormal reaction of the patient, or of later complication, without mention of misadventure at the time of the procedure: Secondary | ICD-10-CM | POA: Diagnosis present

## 2015-07-29 DIAGNOSIS — E782 Mixed hyperlipidemia: Secondary | ICD-10-CM | POA: Diagnosis present

## 2015-07-29 DIAGNOSIS — J449 Chronic obstructive pulmonary disease, unspecified: Secondary | ICD-10-CM | POA: Diagnosis present

## 2015-07-29 DIAGNOSIS — I2119 ST elevation (STEMI) myocardial infarction involving other coronary artery of inferior wall: Secondary | ICD-10-CM | POA: Diagnosis not present

## 2015-07-29 DIAGNOSIS — E785 Hyperlipidemia, unspecified: Secondary | ICD-10-CM | POA: Diagnosis not present

## 2015-07-29 DIAGNOSIS — B20 Human immunodeficiency virus [HIV] disease: Secondary | ICD-10-CM | POA: Diagnosis present

## 2015-07-29 DIAGNOSIS — E1122 Type 2 diabetes mellitus with diabetic chronic kidney disease: Secondary | ICD-10-CM | POA: Diagnosis present

## 2015-07-29 DIAGNOSIS — I25810 Atherosclerosis of coronary artery bypass graft(s) without angina pectoris: Secondary | ICD-10-CM | POA: Diagnosis not present

## 2015-07-29 DIAGNOSIS — R0602 Shortness of breath: Secondary | ICD-10-CM | POA: Diagnosis not present

## 2015-07-29 DIAGNOSIS — E11319 Type 2 diabetes mellitus with unspecified diabetic retinopathy without macular edema: Secondary | ICD-10-CM

## 2015-07-29 DIAGNOSIS — M1990 Unspecified osteoarthritis, unspecified site: Secondary | ICD-10-CM | POA: Diagnosis present

## 2015-07-29 DIAGNOSIS — I350 Nonrheumatic aortic (valve) stenosis: Secondary | ICD-10-CM | POA: Diagnosis present

## 2015-07-29 DIAGNOSIS — E1139 Type 2 diabetes mellitus with other diabetic ophthalmic complication: Secondary | ICD-10-CM | POA: Diagnosis present

## 2015-07-29 HISTORY — PX: CARDIAC CATHETERIZATION: SHX172

## 2015-07-29 HISTORY — DX: ST elevation (STEMI) myocardial infarction of unspecified site: I21.3

## 2015-07-29 LAB — COMPREHENSIVE METABOLIC PANEL
ALBUMIN: 4.2 g/dL (ref 3.5–5.0)
ALK PHOS: 90 U/L (ref 38–126)
ALT: 25 U/L (ref 17–63)
ANION GAP: 15 (ref 5–15)
AST: 27 U/L (ref 15–41)
BUN: 38 mg/dL — AB (ref 6–20)
CALCIUM: 10 mg/dL (ref 8.9–10.3)
CO2: 24 mmol/L (ref 22–32)
Chloride: 100 mmol/L — ABNORMAL LOW (ref 101–111)
Creatinine, Ser: 1.91 mg/dL — ABNORMAL HIGH (ref 0.61–1.24)
GFR calc Af Amer: 40 mL/min — ABNORMAL LOW (ref >60)
GFR calc non Af Amer: 35 mL/min — ABNORMAL LOW (ref >60)
GLUCOSE: 256 mg/dL — AB (ref 65–99)
Potassium: 2.6 mmol/L — CL (ref 3.5–5.1)
SODIUM: 139 mmol/L (ref 135–145)
Total Bilirubin: 0.5 mg/dL (ref 0.3–1.2)
Total Protein: 7.7 g/dL (ref 6.5–8.1)

## 2015-07-29 LAB — BASIC METABOLIC PANEL
ANION GAP: 12 (ref 5–15)
BUN: 33 mg/dL — AB (ref 6–20)
CO2: 24 mmol/L (ref 22–32)
Calcium: 8.9 mg/dL (ref 8.9–10.3)
Chloride: 103 mmol/L (ref 101–111)
Creatinine, Ser: 1.68 mg/dL — ABNORMAL HIGH (ref 0.61–1.24)
GFR calc Af Amer: 47 mL/min — ABNORMAL LOW (ref >60)
GFR calc non Af Amer: 40 mL/min — ABNORMAL LOW (ref >60)
GLUCOSE: 222 mg/dL — AB (ref 65–99)
POTASSIUM: 3.4 mmol/L — AB (ref 3.5–5.1)
Sodium: 139 mmol/L (ref 135–145)

## 2015-07-29 LAB — TROPONIN I
TROPONIN I: 0.06 ng/mL — AB (ref <0.031)
Troponin I: 33.32 ng/mL (ref <0.031)
Troponin I: 41.94 ng/mL (ref <0.031)
Troponin I: 38.61 ng/mL (ref <0.031)
Troponin I: 28.21 ng/mL (ref <0.031)

## 2015-07-29 LAB — CBC
HEMATOCRIT: 33.7 % — AB (ref 39.0–52.0)
HEMATOCRIT: 29.1 % — AB (ref 39.0–52.0)
HEMOGLOBIN: 11.6 g/dL — AB (ref 13.0–17.0)
HEMOGLOBIN: 10.3 g/dL — AB (ref 13.0–17.0)
MCH: 30.5 pg (ref 26.0–34.0)
MCH: 31.3 pg (ref 26.0–34.0)
MCHC: 34.4 g/dL (ref 30.0–36.0)
MCHC: 35.4 g/dL (ref 30.0–36.0)
MCV: 88.7 fL (ref 78.0–100.0)
MCV: 88.4 fL (ref 78.0–100.0)
Platelets: 208 10*3/uL (ref 150–400)
Platelets: 152 10*3/uL (ref 150–400)
RBC: 3.8 MIL/uL — AB (ref 4.22–5.81)
RBC: 3.29 MIL/uL — ABNORMAL LOW (ref 4.22–5.81)
RDW: 14 % (ref 11.5–15.5)
RDW: 14.3 % (ref 11.5–15.5)
WBC: 8.1 10*3/uL (ref 4.0–10.5)
WBC: 9.9 10*3/uL (ref 4.0–10.5)

## 2015-07-29 LAB — POCT ACTIVATED CLOTTING TIME: ACTIVATED CLOTTING TIME: 167 s

## 2015-07-29 LAB — PROTIME-INR
INR: 0.99 (ref 0.00–1.49)
Prothrombin Time: 13.3 seconds (ref 11.6–15.2)

## 2015-07-29 LAB — APTT: APTT: 26 s (ref 24–37)

## 2015-07-29 SURGERY — LEFT HEART CATH AND CORONARY ANGIOGRAPHY
Anesthesia: LOCAL

## 2015-07-29 MED ORDER — IOHEXOL 350 MG/ML SOLN
INTRAVENOUS | Status: DC | PRN
  Administered 2015-07-29: 135 mL via INTRAVENOUS

## 2015-07-29 MED ORDER — SODIUM CHLORIDE 0.9 % WEIGHT BASED INFUSION
1.0000 mL/kg/h | INTRAVENOUS | Status: AC
  Administered 2015-07-29: 1 mL/kg/h via INTRAVENOUS

## 2015-07-29 MED ORDER — BIVALIRUDIN BOLUS VIA INFUSION - CUPID
INTRAVENOUS | Status: DC | PRN
  Administered 2015-07-29: 42.9 mg via INTRAVENOUS

## 2015-07-29 MED ORDER — HEPARIN SODIUM (PORCINE) 5000 UNIT/ML IJ SOLN
5000.0000 [IU] | Freq: Three times a day (TID) | INTRAMUSCULAR | Status: DC
  Administered 2015-07-29 – 2015-07-31 (×4): 5000 [IU] via SUBCUTANEOUS
  Filled 2015-07-29 (×4): qty 1

## 2015-07-29 MED ORDER — VALACYCLOVIR HCL 500 MG PO TABS
500.0000 mg | ORAL_TABLET | Freq: Two times a day (BID) | ORAL | Status: DC
  Administered 2015-07-29 – 2015-07-31 (×5): 500 mg via ORAL
  Filled 2015-07-29 (×6): qty 1

## 2015-07-29 MED ORDER — LINAGLIPTIN 5 MG PO TABS
5.0000 mg | ORAL_TABLET | Freq: Every day | ORAL | Status: DC
  Administered 2015-07-29 – 2015-07-31 (×3): 5 mg via ORAL
  Filled 2015-07-29 (×3): qty 1

## 2015-07-29 MED ORDER — TICAGRELOR 90 MG PO TABS
90.0000 mg | ORAL_TABLET | Freq: Two times a day (BID) | ORAL | Status: DC
  Administered 2015-07-30: 90 mg via ORAL
  Filled 2015-07-29: qty 1

## 2015-07-29 MED ORDER — SODIUM BICARBONATE 650 MG PO TABS
650.0000 mg | ORAL_TABLET | Freq: Three times a day (TID) | ORAL | Status: DC
  Administered 2015-07-29 – 2015-07-31 (×7): 650 mg via ORAL
  Filled 2015-07-29 (×9): qty 1

## 2015-07-29 MED ORDER — POTASSIUM CHLORIDE 20 MEQ PO PACK
PACK | ORAL | Status: DC | PRN
  Administered 2015-07-29: 40 meq via ORAL

## 2015-07-29 MED ORDER — SODIUM CHLORIDE 0.9 % IJ SOLN
3.0000 mL | Freq: Two times a day (BID) | INTRAMUSCULAR | Status: DC
  Administered 2015-07-29 – 2015-07-30 (×4): 3 mL via INTRAVENOUS

## 2015-07-29 MED ORDER — ACETAMINOPHEN 325 MG PO TABS: 650.0000 mg | ORAL_TABLET | ORAL | Status: DC | PRN

## 2015-07-29 MED ORDER — NITROGLYCERIN 0.4 MG SL SUBL: 0.4000 mg | SUBLINGUAL_TABLET | SUBLINGUAL | Status: DC | PRN

## 2015-07-29 MED ORDER — POTASSIUM CHLORIDE CRYS ER 20 MEQ PO TBCR
20.0000 meq | EXTENDED_RELEASE_TABLET | Freq: Two times a day (BID) | ORAL | Status: DC
  Administered 2015-07-29 – 2015-07-31 (×5): 20 meq via ORAL
  Filled 2015-07-29 (×5): qty 1

## 2015-07-29 MED ORDER — MUPIROCIN 2 % EX OINT
1.0000 "application " | TOPICAL_OINTMENT | Freq: Two times a day (BID) | CUTANEOUS | Status: DC
  Administered 2015-07-29 – 2015-07-31 (×5): 1 via NASAL
  Filled 2015-07-29 (×3): qty 22

## 2015-07-29 MED ORDER — RITONAVIR 100 MG PO TABS
100.0000 mg | ORAL_TABLET | Freq: Every day | ORAL | Status: DC
  Administered 2015-07-29 – 2015-07-31 (×3): 100 mg via ORAL
  Filled 2015-07-29 (×4): qty 1

## 2015-07-29 MED ORDER — HEPARIN SODIUM (PORCINE) 5000 UNIT/ML IJ SOLN
60.0000 [IU]/kg | INTRAMUSCULAR | Status: AC
  Administered 2015-07-29: 3450 [IU] via INTRAVENOUS
  Filled 2015-07-29: qty 1

## 2015-07-29 MED ORDER — BIVALIRUDIN 250 MG IV SOLR
INTRAVENOUS | Status: AC
  Filled 2015-07-29: qty 250

## 2015-07-29 MED ORDER — NITROGLYCERIN IN D5W 200-5 MCG/ML-% IV SOLN
0.0000 ug/min | INTRAVENOUS | Status: DC
  Administered 2015-07-29: 5 ug via INTRAVENOUS

## 2015-07-29 MED ORDER — HEPARIN (PORCINE) IN NACL 100-0.45 UNIT/ML-% IJ SOLN
12.0000 [IU]/kg/h | Freq: Once | INTRAMUSCULAR | Status: AC
  Administered 2015-07-29: 12 [IU]/kg/h via INTRAVENOUS
  Filled 2015-07-29: qty 250

## 2015-07-29 MED ORDER — ATORVASTATIN CALCIUM 80 MG PO TABS
80.0000 mg | ORAL_TABLET | Freq: Every day | ORAL | Status: DC
  Administered 2015-07-29 – 2015-07-31 (×3): 80 mg via ORAL
  Filled 2015-07-29 (×3): qty 1

## 2015-07-29 MED ORDER — HEPARIN (PORCINE) IN NACL 2-0.9 UNIT/ML-% IJ SOLN
INTRAMUSCULAR | Status: DC | PRN
  Administered 2015-07-29: 03:00:00

## 2015-07-29 MED ORDER — NITROGLYCERIN IN D5W 200-5 MCG/ML-% IV SOLN
INTRAVENOUS | Status: AC
  Administered 2015-07-29: 5 ug via INTRAVENOUS
  Filled 2015-07-29: qty 250

## 2015-07-29 MED ORDER — TICAGRELOR 90 MG PO TABS
ORAL_TABLET | ORAL | Status: DC | PRN
  Administered 2015-07-29: 180 mg via ORAL

## 2015-07-29 MED ORDER — AMLODIPINE BESYLATE 10 MG PO TABS
10.0000 mg | ORAL_TABLET | Freq: Every day | ORAL | Status: DC
  Administered 2015-07-29 – 2015-07-30 (×2): 10 mg via ORAL
  Filled 2015-07-29 (×2): qty 1

## 2015-07-29 MED ORDER — HEPARIN (PORCINE) IN NACL 2-0.9 UNIT/ML-% IJ SOLN
INTRAMUSCULAR | Status: AC
  Filled 2015-07-29: qty 1000

## 2015-07-29 MED ORDER — ATROPINE SULFATE 0.1 MG/ML IJ SOLN
INTRAMUSCULAR | Status: AC
  Filled 2015-07-29: qty 10

## 2015-07-29 MED ORDER — SODIUM CHLORIDE 0.9 % IV SOLN
250.0000 mg | INTRAVENOUS | Status: DC | PRN
  Administered 2015-07-29: 1 mg/kg/h via INTRAVENOUS

## 2015-07-29 MED ORDER — NITROGLYCERIN 0.4 MG SL SUBL
SUBLINGUAL_TABLET | SUBLINGUAL | Status: AC
  Administered 2015-07-29: 0.4 mg
  Filled 2015-07-29: qty 1

## 2015-07-29 MED ORDER — POTASSIUM CHLORIDE 20 MEQ/15ML (10%) PO SOLN
ORAL | Status: AC
  Filled 2015-07-29: qty 30

## 2015-07-29 MED ORDER — NITROPRUSSIDE SODIUM 25 MG/ML IV SOLN: 0.0000 ug/kg/min | INTRAVENOUS | Status: DC

## 2015-07-29 MED ORDER — PANTOPRAZOLE SODIUM 40 MG PO TBEC
40.0000 mg | DELAYED_RELEASE_TABLET | Freq: Every day | ORAL | Status: DC
  Administered 2015-07-29 – 2015-07-30 (×2): 40 mg via ORAL
  Filled 2015-07-29 (×3): qty 1

## 2015-07-29 MED ORDER — SODIUM CHLORIDE 0.9 % IV SOLN
INTRAVENOUS | Status: DC
  Administered 2015-07-29: 02:00:00 via INTRAVENOUS

## 2015-07-29 MED ORDER — SODIUM CHLORIDE 0.9 % IJ SOLN: 3.0000 mL | INTRAMUSCULAR | Status: DC | PRN

## 2015-07-29 MED ORDER — REPAGLINIDE 2 MG PO TABS
4.0000 mg | ORAL_TABLET | Freq: Two times a day (BID) | ORAL | Status: DC
  Administered 2015-07-29 – 2015-07-31 (×4): 4 mg via ORAL
  Filled 2015-07-29 (×7): qty 2

## 2015-07-29 MED ORDER — CHLORHEXIDINE GLUCONATE CLOTH 2 % EX PADS
6.0000 | MEDICATED_PAD | Freq: Every day | CUTANEOUS | Status: DC
  Administered 2015-07-29 – 2015-07-31 (×3): 6 via TOPICAL

## 2015-07-29 MED ORDER — NITROGLYCERIN 2 % TD OINT
1.0000 [in_us] | TOPICAL_OINTMENT | Freq: Once | TRANSDERMAL | Status: AC
  Administered 2015-07-29: 1 [in_us] via TOPICAL
  Filled 2015-07-29: qty 1

## 2015-07-29 MED ORDER — LIDOCAINE HCL (PF) 1 % IJ SOLN
INTRAMUSCULAR | Status: AC
  Filled 2015-07-29: qty 30

## 2015-07-29 MED ORDER — ASPIRIN 81 MG PO CHEW
81.0000 mg | CHEWABLE_TABLET | Freq: Every day | ORAL | Status: DC
  Administered 2015-07-29 – 2015-07-31 (×3): 81 mg via ORAL
  Filled 2015-07-29 (×3): qty 1

## 2015-07-29 MED ORDER — REPAGLINIDE 2 MG PO TABS
2.0000 mg | ORAL_TABLET | Freq: Every day | ORAL | Status: DC
  Administered 2015-07-30: 2 mg via ORAL
  Filled 2015-07-29 (×2): qty 1

## 2015-07-29 MED ORDER — VITAMIN D 1000 UNITS PO TABS
2000.0000 [IU] | ORAL_TABLET | Freq: Every day | ORAL | Status: DC
  Administered 2015-07-29 – 2015-07-31 (×3): 2000 [IU] via ORAL
  Filled 2015-07-29 (×3): qty 2

## 2015-07-29 MED ORDER — DARUNAVIR ETHANOLATE 800 MG PO TABS
800.0000 mg | ORAL_TABLET | Freq: Every day | ORAL | Status: DC
Start: 2015-07-29 — End: 2015-07-31
  Administered 2015-07-29 – 2015-07-31 (×3): 800 mg via ORAL
  Filled 2015-07-29 (×4): qty 1

## 2015-07-29 MED ORDER — ONDANSETRON HCL 4 MG/2ML IJ SOLN
4.0000 mg | Freq: Four times a day (QID) | INTRAMUSCULAR | Status: DC | PRN
  Administered 2015-07-29: 4 mg via INTRAVENOUS
  Filled 2015-07-29: qty 2

## 2015-07-29 MED ORDER — LIDOCAINE HCL (PF) 1 % IJ SOLN
INTRAMUSCULAR | Status: DC | PRN
  Administered 2015-07-29: 20 mL

## 2015-07-29 MED ORDER — TICAGRELOR 90 MG PO TABS
90.0000 mg | ORAL_TABLET | Freq: Two times a day (BID) | ORAL | Status: DC
  Administered 2015-07-29: 90 mg via ORAL
  Filled 2015-07-29 (×2): qty 1

## 2015-07-29 MED ORDER — RILPIVIRINE HCL 25 MG PO TABS
25.0000 mg | ORAL_TABLET | Freq: Every day | ORAL | Status: DC
  Administered 2015-07-29 – 2015-07-31 (×3): 25 mg via ORAL
  Filled 2015-07-29 (×4): qty 1

## 2015-07-29 MED ORDER — ASPIRIN 81 MG PO CHEW
324.0000 mg | CHEWABLE_TABLET | Freq: Once | ORAL | Status: AC
  Administered 2015-07-29: 324 mg via ORAL
  Filled 2015-07-29: qty 4

## 2015-07-29 MED ORDER — SODIUM CHLORIDE 0.9 % IV SOLN: 250.0000 mL | INTRAVENOUS | Status: DC | PRN

## 2015-07-29 MED ORDER — DOLUTEGRAVIR SODIUM 50 MG PO TABS
50.0000 mg | ORAL_TABLET | Freq: Every day | ORAL | Status: DC
  Administered 2015-07-29: 50 mg via ORAL
  Filled 2015-07-29 (×2): qty 1

## 2015-07-29 MED ORDER — REPAGLINIDE 2 MG PO TABS
2.0000 mg | ORAL_TABLET | Freq: Three times a day (TID) | ORAL | Status: DC
  Administered 2015-07-29 (×2): 2 mg via ORAL
  Filled 2015-07-29 (×4): qty 1

## 2015-07-29 SURGICAL SUPPLY — 18 items
BALLN EMERGE MR 2.5X12 (BALLOONS) ×2
BALLOON EMERGE MR 2.5X12 (BALLOONS) IMPLANT
CATH INFINITI 5FR ANG PIGTAIL (CATHETERS) ×2 IMPLANT
CATH INFINITI 5FR JL4 (CATHETERS) ×1 IMPLANT
CATH INFINITI JR4 5F (CATHETERS) ×1 IMPLANT
CATH VISTA GUIDE 6FR JR4 (CATHETERS) ×1 IMPLANT
DEVICE CONTINUOUS FLUSH (MISCELLANEOUS) ×1 IMPLANT
ELECT DEFIB PAD ADLT CADENCE (PAD) ×1 IMPLANT
GUIDE CATH RUNWAY 6FR MP1 (CATHETERS) ×1 IMPLANT
KIT ENCORE 26 ADVANTAGE (KITS) ×1 IMPLANT
KIT HEART LEFT (KITS) ×2 IMPLANT
PACK CARDIAC CATHETERIZATION (CUSTOM PROCEDURE TRAY) ×2 IMPLANT
SHEATH PINNACLE 6F 10CM (SHEATH) ×1 IMPLANT
STENT PROMUS PREM MR 3.0X20 (Permanent Stent) ×1 IMPLANT
TRANSDUCER W/STOPCOCK (MISCELLANEOUS) ×2 IMPLANT
TUBING CIL FLEX 10 FLL-RA (TUBING) ×2 IMPLANT
WIRE ASAHI PROWATER 180CM (WIRE) ×2 IMPLANT
WIRE EMERALD 3MM-J .035X150CM (WIRE) ×1 IMPLANT

## 2015-07-29 NOTE — Progress Notes (Signed)
  Echocardiogram 2D Echocardiogram has been performed.  Enoch Moffa 07/29/2015, 2:25 PM

## 2015-07-29 NOTE — ED Notes (Signed)
Lab called with a K of 2.6 Dr. Dina Rich aware.

## 2015-07-29 NOTE — ED Notes (Addendum)
Report given to [facility] ED charge nurse Gretta Cool, RN) Lab results reviewed in report

## 2015-07-29 NOTE — Progress Notes (Signed)
CRITICAL VALUE ALERT  Critical value received: Troponin 38.61  Date of notification: 07/29/15  Time of notification:  1800  Critical value read back:No. Lab didn't report.   Nurse who received alert: Cleotis Nipper  MD notified (1st page):  Dr. [place]  Time of first page:  1815  Time MD responded:  1815

## 2015-07-29 NOTE — ED Provider Notes (Signed)
CSN: [id number]     Arrival date & time 07/29/15  0157 History   First MD Initiated Contact with Patient 07/29/15 0224     Chief Complaint  Patient presents with  . Chest Pain     (Consider location/radiation/quality/duration/timing/severity/associated sxs/prior Treatment) HPI  This is a 69 year old male with a history of coronary artery disease, hypertension, diabetes, COPD, HIV who presents with chest pain and nausea. Patient reports sternal 10 out of 10 chest pain. Started roughly 45 minutes prior to arrival. It is constant. Patient was sleeping. Reports associated shortness of breath. Denies diaphoresis. Patient has had a prior CABG but states "I didn't have a heart attack at that time." Initial EKG upon arrival concerning for an inferior STEMI. Code STEMI activated.  I reviewed the patient's chart. Last seen by Dr. Stanford Breed on December 6. EKG is markedly changed from prior.  Past Medical History  Diagnosis Date  . Arthritis   . Diabetes mellitus   . History of depression   . GERD (gastroesophageal reflux disease)   . CAD (coronary artery disease)     2003- cabg  . Hypertension   . Hyperlipidemia   . History of kidney stones   . COPD (chronic obstructive pulmonary disease) ([place])   . HIV (human immunodeficiency virus infection) ([place]) 1991    on meds since initial dx.   . Dermatitis 11/27/2012  . Nocturia 03/06/2013  . Epicondylitis 06/05/2013    right  . Pancreatitis 11/2013    attributed to HIV meds.   . Constipation 05/28/2014  . Pain in joint, ankle and foot 01/19/2015  . Chronic kidney disease    Past Surgical History  Procedure Laterality Date  . Coronary artery bypass graft  05/2002  . Vasectomy     Family History  Problem Relation Age of Onset  . Arthritis      mother/father/paternal grandparents  . Breast cancer Maternal Aunt     paternal aunt  . Lung cancer Maternal Aunt   . Hyperlipidemia Mother   . Hyperlipidemia Father   . Heart disease       parents/maternal grandparents/ 2 brothers  . Stroke Paternal Grandmother   . Hypertension Father     paternal grandmother/3 brothers/1 sister  . Mental retardation Sister   . Diabetes Mother     paternal grandparents/1 brother   Social History  Substance Use Topics  . Smoking status: Never Smoker   . Smokeless tobacco: Never Used  . Alcohol Use: No    Review of Systems  Unable to perform ROS: Acuity of condition      Allergies  Review of patient's allergies indicates no known allergies.  Home Medications   Prior to Admission medications   Medication Sig Start Date End Date Taking? Authorizing Provider  amLODipine (NORVASC) 10 MG tablet Take 1 tablet (10 mg total) by mouth at bedtime. 05/18/15   Mosie Lukes, MD  aspirin 81 MG chewable tablet Chew 81 mg by mouth daily.    Historical Provider, MD  atorvastatin (LIPITOR) 80 MG tablet Take 1 tablet (80 mg total) by mouth daily. 07/11/15   Lelon Perla, MD  b complex vitamins tablet Take 1 tablet by mouth daily. 06/14/15   Mosie Lukes, MD  Cholecalciferol 2000 UNITS CAPS Take 1 capsule (2,000 Units total) by mouth daily. 06/14/15   Mosie Lukes, MD  clotrimazole-betamethasone (LOTRISONE) cream Apply 1 application topically 2 (two) times daily. apply to lesion on chest wall 10/10/14   Mosie Lukes,  MD  Darunavir Ethanolate (PREZISTA) 800 MG tablet Take 1 tablet (800 mg total) by mouth daily. 04/20/15   Michel Bickers, MD  dolutegravir (TIVICAY) 50 MG tablet Take 1 tablet (50 mg total) by mouth daily. 10/06/14   Michel Bickers, MD  furosemide (LASIX) 20 MG tablet TAKE 2 TABLETS BY MOUTH DAILY AS NEEDED FOR FLUID OR EDEMA 04/27/15   Mosie Lukes, MD  glucose blood (FREESTYLE TEST STRIPS) test strip USE TO CHECK BLOOD SUGAR once daily 02/28/15   Elayne Snare, MD  Astrid Drafts Omega-3 300 MG CAPS Take 1 capsule daily 06/14/15   Mosie Lukes, MD  Lancets (FREESTYLE) lancets Use to check blood sugars 3 times daily. 02/16/15   Mosie Lukes, MD  lisinopril (PRINIVIL,ZESTRIL) 5 MG tablet Take 1/2 by mouth daily Patient taking differently: 5 mg. Take 1/2 by mouth every other day 05/18/15   Mosie Lukes, MD  metolazone (ZAROXOLYN) 2.5 MG tablet TAKE 1 TABLET BY MOUTH EVERY DAY 20 MINUTES PRIOR TO LASIX 03/22/15   Mosie Lukes, MD  neomycin-polymyxin-hydrocortisone (CORTISPORIN) otic solution Place 3 drops into both ears 3 (three) times daily. 07/17/15   Mosie Lukes, MD  NORVIR 100 MG TABS tablet TAKE 1 TABLET BY MOUTH EVERY DAY 03/21/15   Michel Bickers, MD  omeprazole (PRILOSEC) 20 MG capsule TAKE ONE CAPSULE BY MOUTH DAILY. TAKE 12 HOURS APART FROM EDURANT DOSE. 05/21/15   Michel Bickers, MD  potassium chloride SA (K-DUR,KLOR-CON) 20 MEQ tablet Take 1 tablet (20 mEq total) by mouth 2 (two) times daily. 01/19/15   Mosie Lukes, MD  Probiotic Product (PROBIOTIC DAILY) CAPS Take 1 by mouth daily 06/14/15   Mosie Lukes, MD  promethazine-codeine [facility] WITH CODEINE) 6.25-10 MG/5ML syrup Take 5 mLs by mouth at bedtime as needed for cough. 06/22/15   Golden Circle, FNP  repaglinide (PRANDIN) 2 MG tablet TAKE 2 TABLETS BY MOUTH BEFORE BREAKFAST, 1 TABLET BY MOUTH BEFORE LUNCH AND 2 TABLETS BY MOUTH BEFORE SUPPER 06/25/15   Elayne Snare, MD  rilpivirine (EDURANT) 25 MG TABS tablet Take 1 tablet (25 mg total) by mouth daily with breakfast. 10/06/14   Michel Bickers, MD  sitaGLIPtin (JANUVIA) 50 MG tablet Take 1 tablet (50 mg total) by mouth daily. 05/18/15   Mosie Lukes, MD  sodium bicarbonate 650 MG tablet Take 650 mg by mouth 3 (three) times daily.    Historical Provider, MD  valACYclovir (VALTREX) 500 MG tablet Take 1 tablet (500 mg total) by mouth 2 (two) times daily. 05/18/15   Michel Bickers, MD   BP 121/69 mmHg  Pulse 86  Temp(Src) 98.2 F (36.8 C) (Oral)  Resp 22  Ht 5\' 3"  (1.6 m)  Wt 126 lb (57.153 kg)  BMI 22.33 kg/m2  SpO2 100% Physical Exam  Constitutional: He is oriented to person, place, and time.   Ill-appearing, nontoxic  HENT:  Head: Normocephalic and atraumatic.  Cardiovascular: Normal rate, regular rhythm and normal heart sounds.   No murmur heard. Pulmonary/Chest: Effort normal and breath sounds normal. No respiratory distress. He has no wheezes.  Midline sternotomy scar  Abdominal: Soft. Bowel sounds are normal. There is no tenderness. There is no rebound.  Musculoskeletal: He exhibits no edema.  Neurological: He is alert and oriented to person, place, and time.  Skin:  Clammy cold  Psychiatric: He has a normal mood and affect.  Nursing note and vitals reviewed.   ED Course  Procedures (including critical care time)  CRITICAL CARE Performed by: Merryl Hacker   Total critical care time: 60 minutes  Critical care time was exclusive of separately billable procedures and treating other patients.  Critical care was necessary to treat or prevent imminent or life-threatening deterioration.  Critical care was time spent personally by me on the following activities: development of treatment plan with patient and/or surrogate as well as nursing, discussions with consultants, evaluation of patient's response to treatment, examination of patient, obtaining history from patient or surrogate, ordering and performing treatments and interventions, ordering and review of laboratory studies, ordering and review of radiographic studies, pulse oximetry and re-evaluation of patient's condition. Labs Review Labs Reviewed  CBC - Abnormal; Notable for the following:    RBC 3.80 (*)    Hemoglobin 11.6 (*)    HCT 33.7 (*)    All other components within normal limits  COMPREHENSIVE METABOLIC PANEL - Abnormal; Notable for the following:    Potassium 2.6 (*)    Chloride 100 (*)    Glucose, Bld 256 (*)    BUN 38 (*)    Creatinine, Ser 1.91 (*)    GFR calc non Af Amer 35 (*)    GFR calc Af Amer 40 (*)    All other components within normal limits  TROPONIN I - Abnormal; Notable for  the following:    Troponin I 0.06 (*)    All other components within normal limits  APTT  PROTIME-INR    Imaging Review No results found. I have personally reviewed and evaluated these images and lab results as part of my medical decision-making.   EKG Interpretation   Date/Time:  Sunday July 29 2015 02:12:54 EST Ventricular Rate:  82 PR Interval:  226 QRS Duration: 106 QT Interval:  450 QTC Calculation: 525 R Axis:   44 Text Interpretation:  Critical Test Result: STEMI Sinus rhythm with  1st degree A-V block Left ventricular hypertrophy with repolarization  abnormality ST elevation consider inferolateral injury or acute infarct  Prolonged QT ** ** ACUTE MI / STEMI ** ** Consider right ventricular  involvement in acute inferior infarct Abnormal ECG Inferior STEMI  Confirmed by Sean Macwilliams  MD, Willam Munford ([id number]) on 07/29/2015 3:02:52 AM     Medications  0.9 %  sodium chloride infusion ( Intravenous New Bag/Given 07/29/15 0223)  nitroGLYCERIN (NITROSTAT) SL tablet 0.4 mg ( Sublingual MAR Hold 07/29/15 0302)  nitroGLYCERIN 50 mg in dextrose 5 % 250 mL (0.2 mg/mL) infusion ( Intravenous MAR Hold 07/29/15 0302)  aspirin chewable tablet 324 mg (324 mg Oral Given 07/29/15 0222)  heparin injection 3,450 Units (3,450 Units Intravenous Given 07/29/15 0227)  heparin ADULT infusion 100 units/mL (25000 units/250 mL) (12 Units/kg/hr  57.2 kg Intravenous Transfusing/Transfer 07/29/15 0244)  nitroGLYCERIN (NITROGLYN) 2 % ointment 1 inch (1 inch Topical Given 07/29/15 0229)  nitroGLYCERIN (NITROSTAT) 0.4 MG SL tablet (0.4 mg  Given 07/29/15 0224)  nitroGLYCERIN 0.2 mg/mL in dextrose 5 % infusion (5 mcg/min  Transfusing/Transfer 07/29/15 0243)    MDM   Final diagnoses:  ST elevation myocardial infarction (STEMI) involving other coronary artery of inferior wall [facility])    Patient presents with chest pain and nausea. One episode of vomiting. Ill-appearing but nontoxic on exam.  Vital signs  reassuring. EKG obtained upon arrival concerning for an inferior STEMI. Code STEMI was activated immediately. Discussed with Dr. Martinique. Patient will be transferred emergently to the Cath Lab. Patient was loaded with bolus heparin and full dose aspirin. He was given one sublingual nitroglycerin. Improvement  of pain from 10 to 8.  Nitroglycerin drip started.  Heparin drip initiated as well.  On recheck, patient pain down to the 6 with nitroglycerin at 5. [facility] is here to transport the patient.    Merryl Hacker, MD 07/29/15 [phone]

## 2015-07-29 NOTE — ED Notes (Signed)
Report given to [facility] 

## 2015-07-29 NOTE — Progress Notes (Signed)
CRITICAL VALUE ALERT  Critical value received:  Troponin 41.94  Date of notification:  07/29/15  Time of notification:  [id number]  Critical value read back:No. Lab did not call.  MD notified (1st page):  Dr. Martinique   Time of first page: 1230

## 2015-07-29 NOTE — ED Notes (Signed)
Pt called and spoke to his daughter updating her that he will be transferred.

## 2015-07-29 NOTE — ED Notes (Addendum)
Pt states that he awoke to upper abd pain/ant chest pain approx 45 minutes pta  with one episode of vomiting. States the pain is constant. Describes as a sharp. States he feels a little sob. No distress on arrival to ED, but skin color a little dusky.   Denies any fevers. States he felt clammy when he had nausea. States nausea has improved at present.  Pt immediately to room 10 via wheelchair  and EKG obtained. 02 placed at 2l via n/c and monitor on arrival to room.

## 2015-07-29 NOTE — Progress Notes (Signed)
CRITICAL VALUE ALERT  Critical value received:  Troponin 33.32  Date of notification:  12/25  Time of notification:  0605  Critical value read back:Yes.    Nurse who received alert:  Lexi  MD notified (1st page):  Eula Fried  Time of first page:  0610  MD notified (2nd page):  Time of second page:  Responding MD:  Eula Fried  Time MD responded:  [phone]

## 2015-07-29 NOTE — Progress Notes (Signed)
Right femoral sheath pulled with two RNs at bedside, pressure held for 20 minutes, vital signs stable throughout, pressure dressing applied. Site level 0. Will continue to monitor closely.

## 2015-07-29 NOTE — ED Notes (Addendum)
EKG obtained showing possible MI  given to Dr. Dina Rich and [facility] called. [facility] in route to transport patient.

## 2015-07-29 NOTE — H&P (Addendum)
CARDIOLOGY INPATIENT HISTORY AND PHYSICAL EXAMINATION NOTE  Patient ID: DARIAN PEPIN MRN: [id number], DOB/AGE: [birth year]   Admit date: 07/29/2015   Primary Physician: Penni Homans, MD Primary Cardiologist: Kirk Ruths MD  Reason for admission: STEMI  HPI: This is a 69 y.o. male with known history of CAD s/p CABG in 05/2002 at [facility], HTN, DM2, CKD stage 3, prior history of AKI requiring intermittent dialysis, GERD, HIV, COPD, HLD, depression and pancreatitis who presented with chest pain today to the ED.  Patient said that he has upper abdominal and chest pain for several hours. It is sharp in character, and associated with SOB, diaphoresis, nausea and 1 x episode vomiting. STEMI was called around 2:15 AM. Patient described chest pain as 10/10 in intensity, non relieving with medications. He remained hemodynamically stable. The chest pain started at 1AM and woke him up from sleeping. Post procedure he is feeling nauseas but is doing well.   Prior cardiac work up includes myoview in 09/2011 which showed normal LV function, normal perfusion.   Problem List: Past Medical History  Diagnosis Date  . Arthritis   . Diabetes mellitus   . History of depression   . GERD (gastroesophageal reflux disease)   . CAD (coronary artery disease)     2003- cabg  . Hypertension   . Hyperlipidemia   . History of kidney stones   . COPD (chronic obstructive pulmonary disease) ([place])   . HIV (human immunodeficiency virus infection) ([place]) 1991    on meds since initial dx.   . Dermatitis 11/27/2012  . Nocturia 03/06/2013  . Epicondylitis 06/05/2013    right  . Pancreatitis 11/2013    attributed to HIV meds.   . Constipation 05/28/2014  . Pain in joint, ankle and foot 01/19/2015  . Chronic kidney disease     Past Surgical History  Procedure Laterality Date  . Coronary artery bypass graft  05/2002  . Vasectomy       Allergies: No Known Allergies   Home Medications Current Facility-Administered  Medications  Medication Dose Route Frequency Provider Last Rate Last Dose  . 0.9 %  sodium chloride infusion   Intravenous Continuous Merryl Hacker, MD 10 mL/hr at 07/29/15 0223    . 0.9 %  sodium chloride infusion  250 mL Intravenous PRN [name], MD      . 0.9% sodium chloride infusion  1 mL/kg/hr Intravenous Continuous [name], MD 57.2 mL/hr at 07/29/15 0428 1 mL/kg/hr at 07/29/15 0428  . acetaminophen (TYLENOL) tablet 650 mg  650 mg Oral Q4H PRN [name], MD      . amLODipine (NORVASC) tablet 10 mg  10 mg Oral QHS [name], MD      . aspirin chewable tablet 81 mg  81 mg Oral Daily [name], MD      . atorvastatin (LIPITOR) tablet 80 mg  80 mg Oral Daily [name], MD      . cholecalciferol (VITAMIN D) tablet 2,000 Units  2,000 Units Oral Daily [name], MD      . Darunavir Ethanolate (PREZISTA) tablet 800 mg  800 mg Oral Q breakfast [name], MD      . dolutegravir [facility]) tablet 50 mg  50 mg Oral Daily [name], MD      . heparin injection 5,000 Units  5,000 Units Subcutaneous 3 times per day [name], MD      . linagliptin (TRADJENTA) tablet 5  mg  5 mg Oral Daily [name], MD      . nitroGLYCERIN (NITROSTAT) SL tablet 0.4 mg  0.4 mg Sublingual Q5 min PRN Merryl Hacker, MD      . ondansetron [facility]) injection 4 mg  4 mg Intravenous Q6H PRN [name], MD      . pantoprazole (PROTONIX) EC tablet 40 mg  40 mg Oral Daily [name], MD      . potassium chloride SA (K-DUR,KLOR-CON) CR tablet 20 mEq  20 mEq Oral BID [name], MD      . repaglinide [facility]) tablet 2 mg  2 mg Oral TID [facility] [name], MD      . rilpivirine [facility]) tablet 25 mg  25 mg Oral Q breakfast [name], MD      . ritonavir (NORVIR) tablet 100 mg  100 mg Oral Q breakfast [name], MD      . sodium bicarbonate tablet 650 mg  650 mg Oral TID [name], MD      . sodium chloride 0.9 % injection 3 mL  3 mL  Intravenous Q12H [name], MD      . sodium chloride 0.9 % injection 3 mL  3 mL Intravenous PRN [name], MD      . ticagrelor [facility]) tablet 90 mg  90 mg Oral BID [name], MD      . valACYclovir [facility]) tablet 500 mg  500 mg Oral BID [name], MD         Family History  Problem Relation Age of Onset  . Arthritis      mother/father/paternal grandparents  . Breast cancer Maternal Aunt     paternal aunt  . Lung cancer Maternal Aunt   . Hyperlipidemia Mother   . Hyperlipidemia Father   . Heart disease      parents/maternal grandparents/ 2 brothers  . Stroke Paternal Grandmother   . Hypertension Father     paternal grandmother/3 brothers/1 sister  . Mental retardation Sister   . Diabetes Mother     paternal grandparents/1 brother     Social History   Social History  . Marital Status: Divorced    Spouse Name: N/A  . Number of Children: 4  . Years of Education: N/A   Occupational History  . 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    Social History Main Topics  . Smoking status: Never Smoker   . Smokeless tobacco: Never Used  . Alcohol Use: No  . Drug Use: No  . Sexual Activity: Not Currently     Comment: declined condoms   Other Topics Concern  . Not on file   Social History Narrative   Lives alone.  Supportive friends and family.  His HIV Dx is not a secret.      Review of Systems: General: negative for chills, fever, night sweats or weight changes.  Cardiovascular: chest pain, dyspnea negative for dyspnea on exertion, edema, orthopnea, palpitations Dermatological: negative for rash Respiratory: negative for cough or wheezing Urologic: negative for hematuria Abdominal:nausea and vomiting Neurologic: negative for visual changes, syncope, or dizziness Endocrine: diabetes,  Immunological: has HIV, no lymph adenopathy Psych: non homicidal/suicidal  Physical Exam: Vitals: BP 109/72 mmHg  Pulse  77  Temp(Src) 97.7 F (36.5 C) (Oral)  Resp 14  Ht 5\' 3"  (1.6 m)  Wt 52.7 kg (116 lb 2.9  oz)  BMI 20.59 kg/m2  SpO2 99% General: not in acute distress Neck: JVP flat, neck supple Heart: regular rate and rhythm, S1, S2, grade II/VI ejection systolic murmur at the LUSB  Lungs: CTAB  GI: non tender, non distended, bowel sounds present Extremities: no edema Neuro: AAO x 3  Psych: normal affect, no anxiety   Labs:   Results for orders placed or performed during the hospital encounter of 07/29/15 (from the past 24 hour(s))  APTT     Status: None   Collection Time: 07/29/15  2:15 AM  Result Value Ref Range   aPTT 26 24 - 37 seconds  CBC     Status: Abnormal   Collection Time: 07/29/15  2:15 AM  Result Value Ref Range   WBC 8.1 4.0 - 10.5 K/uL   RBC 3.80 (L) 4.22 - 5.81 MIL/uL   Hemoglobin 11.6 (L) 13.0 - 17.0 g/dL   HCT 33.7 (L) 39.0 - 52.0 %   MCV 88.7 78.0 - 100.0 fL   MCH 30.5 26.0 - 34.0 pg   MCHC 34.4 30.0 - 36.0 g/dL   RDW 14.0 11.5 - 15.5 %   Platelets 208 150 - 400 K/uL  Comprehensive metabolic panel     Status: Abnormal   Collection Time: 07/29/15  2:15 AM  Result Value Ref Range   Sodium 139 135 - 145 mmol/L   Potassium 2.6 (LL) 3.5 - 5.1 mmol/L   Chloride 100 (L) 101 - 111 mmol/L   CO2 24 22 - 32 mmol/L   Glucose, Bld 256 (H) 65 - 99 mg/dL   BUN 38 (H) 6 - 20 mg/dL   Creatinine, Ser 1.91 (H) 0.61 - 1.24 mg/dL   Calcium 10.0 8.9 - 10.3 mg/dL   Total Protein 7.7 6.5 - 8.1 g/dL   Albumin 4.2 3.5 - 5.0 g/dL   AST 27 15 - 41 U/L   ALT 25 17 - 63 U/L   Alkaline Phosphatase 90 38 - 126 U/L   Total Bilirubin 0.5 0.3 - 1.2 mg/dL   GFR calc non Af Amer 35 (L) >60 mL/min   GFR calc Af Amer 40 (L) >60 mL/min   Anion gap 15 5 - 15  Protime-INR     Status: None   Collection Time: 07/29/15  2:15 AM  Result Value Ref Range   Prothrombin Time 13.3 11.6 - 15.2 seconds   INR 0.99 0.00 - 1.49  Troponin I     Status: Abnormal   Collection Time: 07/29/15  2:15 AM  Result  Value Ref Range   Troponin I 0.06 (H) <0.031 ng/mL  Basic metabolic panel     Status: Abnormal   Collection Time: 07/29/15  5:15 AM  Result Value Ref Range   Sodium 139 135 - 145 mmol/L   Potassium 3.4 (L) 3.5 - 5.1 mmol/L   Chloride 103 101 - 111 mmol/L   CO2 24 22 - 32 mmol/L   Glucose, Bld 222 (H) 65 - 99 mg/dL   BUN 33 (H) 6 - 20 mg/dL   Creatinine, Ser 1.68 (H) 0.61 - 1.24 mg/dL   Calcium 8.9 8.9 - 10.3 mg/dL   GFR calc non Af Amer 40 (L) >60 mL/min   GFR calc Af Amer 47 (L) >60 mL/min   Anion gap 12 5 - 15  CBC     Status: Abnormal   Collection Time: 07/29/15  5:15 AM  Result Value Ref Range   WBC 9.9 4.0 - 10.5 K/uL  RBC 3.29 (L) 4.22 - 5.81 MIL/uL   Hemoglobin 10.3 (L) 13.0 - 17.0 g/dL   HCT 29.1 (L) 39.0 - 52.0 %   MCV 88.4 78.0 - 100.0 fL   MCH 31.3 26.0 - 34.0 pg   MCHC 35.4 30.0 - 36.0 g/dL   RDW 14.3 11.5 - 15.5 %   Platelets 152 150 - 400 K/uL  Troponin I (serum)     Status: Abnormal   Collection Time: 07/29/15  5:15 AM  Result Value Ref Range   Troponin I 33.32 (HH) <0.031 ng/mL  POCT Activated clotting time     Status: None   Collection Time: 07/29/15  6:01 AM  Result Value Ref Range   Activated Clotting Time 167 seconds     Radiology/Studies: No results found.  EKG: normal sinus rhythm, inferior Q waves with mid ST elevation present  Echo: pending  Cardiac cath: 07/29/2015  Prox LAD to Mid LAD lesion, 100% stenosed.  Ost Ramus to Ramus lesion, 100% stenosed.  Ost Cx to Prox Cx lesion, 100% stenosed.  Prox RCA lesion, 100% stenosed.  LIMA to the LAD was injected is normal in caliber.  SVG to the first diagonal was injected . There is an 85% stenosis at the distal anastomosis  Sequential SVG to the ramus intermediate and first OM was injected is normal in caliber, and is anatomically normal.  Mid Cx lesion, 100% stenosed.  Sequential SVG to the left PDA and PL branch was injected . There is a 99% stenosis in the distal SVG.  Dist Graft  lesion, before LPDA, 99% stenosed. Post intervention, there is a 0% residual stenosis.  Medical decision making:  Discussed care with the patient Reviewed labs and imaging personally Reviewed prior records  ASSESSMENT AND PLAN:  This is a 69 y.o. male with known history of CAD s/p CABG and several risk factors presented with inferior STEMI and got revascularized.    Principal Problem:   STEMI (ST elevation myocardial infarction) ([place]) Active Problems:   Human immunodeficiency virus (HIV) disease ([place])   DM (diabetes mellitus), type 2 with ophthalmic complications ([place])   Essential hypertension   CORONARY ARTERY BYPASS GRAFT, HX OF   HLD (hyperlipidemia)   Subsequent ST elevation (STEMI) myocardial infarction of inferior wall ([place])  1. Emergent cardiac catheterization and PCI 2. Risk stratify with HbA1c, lipid profile, TSH 3. Echocardiogram in the morning 4. Aspirin, antiplatelet, high dose statin and beta blocker if tolerated 5. Tobacco cessation counseling provided 6. Medication compliance emphasized to the patient 7. SSI and finger sticks 8. zofran prn for nausea 9. High dose statin   Signed, Flossie Dibble, MD MS 07/29/2015, 7:18 AM

## 2015-07-29 NOTE — ED Notes (Signed)
[facility] arrived to transport pt to [facility]

## 2015-07-30 DIAGNOSIS — B20 Human immunodeficiency virus [HIV] disease: Secondary | ICD-10-CM

## 2015-07-30 LAB — GLUCOSE, CAPILLARY: GLUCOSE-CAPILLARY: 221 mg/dL — AB (ref 65–99)

## 2015-07-30 LAB — MRSA PCR SCREENING: MRSA BY PCR: POSITIVE — AB

## 2015-07-30 LAB — TROPONIN I
TROPONIN I: 10.98 ng/mL — AB (ref <0.031)
Troponin I: 18.34 ng/mL (ref <0.031)
Troponin I: 10.05 ng/mL (ref <0.031)
Troponin I: 9.82 ng/mL (ref <0.031)

## 2015-07-30 MED ORDER — DOLUTEGRAVIR SODIUM 50 MG PO TABS
50.0000 mg | ORAL_TABLET | Freq: Every day | ORAL | Status: DC
  Administered 2015-07-30 – 2015-07-31 (×2): 50 mg via ORAL
  Filled 2015-07-30 (×4): qty 1

## 2015-07-30 MED ORDER — PRASUGREL HCL 10 MG PO TABS
10.0000 mg | ORAL_TABLET | Freq: Every day | ORAL | Status: DC
  Administered 2015-07-30: 10 mg via ORAL
  Filled 2015-07-30: qty 1

## 2015-07-30 NOTE — Progress Notes (Addendum)
Pharmacy Re: Drug Interaction between Darunavir and Brilinta  Darunavir is a protease inhibitor and a major substrate of cytochrome P450, and a strong CYP3A4 inhibitor.  [name] is also taking ritonavir for a boosting agent, and this is also a strong CYP3A4 inhibitor.  Ticagrelor is metabolized via the CYP3A4 pathway. Plasma concentrations and pharmacologic effects of Ticagrelor Oral may be increased by Darunavir Ethanolate Oral. Coadministration of Ticagrelor Oral and Darunavir Ethanolate Oral should be avoided according to the [place] official package labeling.    Clopidogrel and prasugrel are minor substrates of CYP3A4.  Either one should be appropriate for use with his HIV medications.  Due to hx DM, prasugrel may be preferred.    Spoke with Dr. Martinique, will proceed with prasugrel for now, although his bleeding risk may be a little higher given small patient size.  Could consider outpatient P2Y12 testing eventually.  Spoke with patient - confirmed no hx of TIA or stroke, and reinforced need for compliance with antiplatelet Rx.  [name], BCPS  Clinical Pharmacist Pager [phone]  07/30/2015 8:08 AM

## 2015-07-30 NOTE — Care Management Note (Addendum)
Case Management Note  Patient Details  Name: [name] MRN: [id number] Date of Birth: Mar 22, 1946  Subjective/Objective:        Adm w mi            Action/Plan: lives at home   Expected Discharge Date:                 Expected Discharge Plan:  Home/Self Care  In-House Referral:     Discharge planning Services  CM Consult, Medication Assistance  Post Acute Care Choice:    Choice offered to:     DME Arranged:    DME Agency:     HH Arranged:    [place] Agency:     Status of Service:     Medicare Important Message Given:    Date Medicare IM Given:    Medicare IM give by:    Date Additional Medicare IM Given:    Additional Medicare Important Message give by:     If discussed at [place] of Stay Meetings, dates discussed:    Additional Comments: ur review done, gave pt 30day free effient card. Pt states has ins for meds.  07/31/15- Marvetta Gibbons- spoke with pt at bedside- confirmed that pt has 30 day free card for effient- insurance check done for benefits- S/W GERI @ [facility] # [phone]   EFFIENT 10 MG DAILY 30 [place] $ 7.40  PRIOR APPROVAL - NO  PHARMACY: [facility] # [phone]    Shawnie Pons, RN 07/30/2015, 9:50 AM

## 2015-07-30 NOTE — Progress Notes (Signed)
TELEMETRY: Reviewed telemetry pt in NSR: Filed Vitals:   07/30/15 0200 07/30/15 0300 07/30/15 0400 07/30/15 0500  BP: 107/67 104/66 115/62 103/66  Pulse: 78 79 82 79  Temp:      TempSrc:      Resp: 13 16 13 16   Height:      Weight:      SpO2: 99% 98% 99% 99%    Intake/Output Summary (Last 24 hours) at 07/30/15 0630 Last data filed at 07/30/15 0558  Gross per 24 hour  Intake 1166.37 ml  Output   2800 ml  Net -1633.63 ml   Filed Weights   07/29/15 0201 07/29/15 0430  Weight: 57.153 kg (126 lb) 52.7 kg (116 lb 2.9 oz)    Subjective Feels well today. No further chest pain. No SOB.   Marland Kitchen amLODipine  10 mg Oral QHS  . aspirin  81 mg Oral Daily  . atorvastatin  80 mg Oral Daily  . Chlorhexidine Gluconate Cloth  6 each Topical Q0600  . cholecalciferol  2,000 Units Oral Daily  . Darunavir Ethanolate  800 mg Oral Q breakfast  . dolutegravir  50 mg Oral Daily  . heparin  5,000 Units Subcutaneous 3 times per day  . linagliptin  5 mg Oral Daily  . mupirocin ointment  1 application Nasal BID  . pantoprazole  40 mg Oral Daily  . potassium chloride SA  20 mEq Oral BID  . repaglinide  2 mg Oral Q lunch  . repaglinide  4 mg Oral BID AC  . rilpivirine  25 mg Oral Q breakfast  . ritonavir  100 mg Oral Q breakfast  . sodium bicarbonate  650 mg Oral TID  . sodium chloride  3 mL Intravenous Q12H  . ticagrelor  90 mg Oral Q12H  . valACYclovir  500 mg Oral BID   . sodium chloride Stopped (07/29/15 1700)    LABS: Basic Metabolic Panel:  Recent Labs  07/29/15 0215 07/29/15 0515  NA 139 139  K 2.6* 3.4*  CL 100* 103  CO2 24 24  GLUCOSE 256* 222*  BUN 38* 33*  CREATININE 1.91* 1.68*  CALCIUM 10.0 8.9   Liver Function Tests:  Recent Labs  07/29/15 0215  AST 27  ALT 25  ALKPHOS 90  BILITOT 0.5  PROT 7.7  ALBUMIN 4.2   No results for input(s): LIPASE, AMYLASE in the last 72 hours. CBC:  Recent Labs  07/29/15 0215 07/29/15 0515  WBC 8.1 9.9  HGB 11.6* 10.3*    HCT 33.7* 29.1*  MCV 88.7 88.4  PLT 208 152   Cardiac Enzymes:  Recent Labs  07/29/15 1650 07/29/15 2225 07/30/15 0414  TROPONINI 38.61* 28.21* 18.34*   BNP: No results for input(s): PROBNP in the last 72 hours. D-Dimer: No results for input(s): DDIMER in the last 72 hours. Hemoglobin A1C: No results for input(s): HGBA1C in the last 72 hours. Fasting Lipid Panel: No results for input(s): CHOL, HDL, LDLCALC, TRIG, CHOLHDL, LDLDIRECT in the last 72 hours. Thyroid Function Tests: No results for input(s): TSH, T4TOTAL, T3FREE, THYROIDAB in the last 72 hours.  Invalid input(s): FREET3   Radiology/Studies:  No results found.  Echo 07/29/15:Study Conclusions  - Left ventricle: The cavity size was normal. Wall thickness was increased in a pattern of moderate LVH. Systolic function was normal. The estimated ejection fraction was in the range of 55% to 60%. Wall motion was normal; there were no regional wall motion abnormalities. Features are consistent with a pseudonormal left  ventricular filling pattern, with concomitant abnormal relaxation and increased filling pressure (grade 2 diastolic dysfunction). - Aortic valve: Calcification. Right coronary cusp mobility was restricted/fused. There was mild stenosis. Valve area (VTI): 1.76 cm^2. Valve area (Vmax): 1.69 cm^2. Valve area (Vmean): 1.71 cm^2. - Mitral valve: Calcified annulus. There was mild regurgitation. - Left atrium: The atrium was mildly dilated. - Pulmonary arteries: PA peak pressure: 31 mm Hg (S).   PHYSICAL EXAM General: Well developed, well nourished, in no acute distress. Head: Normocephalic, atraumatic, sclera non-icteric, oropharynx is clear Neck: Negative for carotid bruits. JVD not elevated. No adenopathy Lungs: Clear bilaterally to auscultation without wheezes, rales, or rhonchi. Breathing is unlabored. Heart: RRR S1 S2 with 2/6 systolic murmur.  Abdomen: Soft, non-tender,  non-distended with normoactive bowel sounds. No hepatomegaly. No rebound/guarding. No obvious abdominal masses. Msk:  Strength and tone appears normal for age. Extremities: No clubbing, cyanosis or edema.  Distal pedal pulses are 2+ and equal bilaterally. Neuro: Alert and oriented X 3. Moves all extremities spontaneously. Psych:  Responds to questions appropriately with a normal affect.  ASSESSMENT AND PLAN: 1. Inferior STEMI secondary to stenosis in SVG to PDA/PL branch. S/p DES stent. No recurrent chest pain. Peak troponin  41.94. Ecg pending this am but post procedure Ecg showed resolution of ST elevation. Echo shows normal LV function. Continue DAPT. There may be some concern for interaction with Brilinta and antiretroviral therapy. Will ask pharmacy to review. If Effient is a better option we can switch. 2. Mild aortic stenosis 3. HIV postitive 4. DM type 2  5. HTN controlled. ACEi and diuretics on hold pending renal function post cath. BP well controlled on amlodipine and metoprolol. 6. CKD stage 3. Renal function better this am. Continue to monitor. 7. Hyperlipidemia. On high dose statin with lipitor.  Will transfer to floor today. Ambulate with cardiac rehab. Anticipate DC in am.  Present on Admission:  . STEMI (ST elevation myocardial infarction) ([place]) . Human immunodeficiency virus (HIV) disease ([place]) . DM (diabetes mellitus), type 2 with ophthalmic complications ([place]) . Essential hypertension . HLD (hyperlipidemia) . Subsequent ST elevation (STEMI) myocardial infarction of inferior wall ([place])  Signed, Chloris Marcoux Martinique, [place] 07/30/2015 6:30 AM

## 2015-07-31 ENCOUNTER — Telehealth: Payer: Self-pay | Admitting: Cardiology

## 2015-07-31 ENCOUNTER — Other Ambulatory Visit: Payer: Self-pay

## 2015-07-31 ENCOUNTER — Ambulatory Visit: Payer: Medicare Other | Admitting: Podiatry

## 2015-07-31 ENCOUNTER — Encounter (HOSPITAL_COMMUNITY): Payer: Self-pay | Admitting: Cardiology

## 2015-07-31 DIAGNOSIS — I221 Subsequent ST elevation (STEMI) myocardial infarction of inferior wall: Secondary | ICD-10-CM

## 2015-07-31 LAB — TROPONIN I: Troponin I: 7.44 ng/mL (ref <0.031)

## 2015-07-31 LAB — BASIC METABOLIC PANEL
ANION GAP: 10 (ref 5–15)
BUN: 26 mg/dL — ABNORMAL HIGH (ref 6–20)
CHLORIDE: 108 mmol/L (ref 101–111)
CO2: 23 mmol/L (ref 22–32)
Calcium: 8.7 mg/dL — ABNORMAL LOW (ref 8.9–10.3)
Creatinine, Ser: 1.66 mg/dL — ABNORMAL HIGH (ref 0.61–1.24)
GFR calc non Af Amer: 41 mL/min — ABNORMAL LOW (ref >60)
GFR, EST AFRICAN AMERICAN: 48 mL/min — AB (ref >60)
Glucose, Bld: 106 mg/dL — ABNORMAL HIGH (ref 65–99)
POTASSIUM: 3.9 mmol/L (ref 3.5–5.1)
SODIUM: 141 mmol/L (ref 135–145)

## 2015-07-31 LAB — POCT ACTIVATED CLOTTING TIME: ACTIVATED CLOTTING TIME: 353 s

## 2015-07-31 MED ORDER — NITROGLYCERIN 0.4 MG SL SUBL: 0.4000 mg | SUBLINGUAL_TABLET | SUBLINGUAL | Status: DC | PRN

## 2015-07-31 MED ORDER — PRASUGREL HCL 10 MG PO TABS: 10.0000 mg | ORAL_TABLET | Freq: Every day | ORAL | Status: DC

## 2015-07-31 MED ORDER — PANTOPRAZOLE SODIUM 40 MG PO TBEC: 40.0000 mg | DELAYED_RELEASE_TABLET | Freq: Every day | ORAL | Status: DC

## 2015-07-31 MED ORDER — POTASSIUM CHLORIDE CRYS ER 20 MEQ PO TBCR: EXTENDED_RELEASE_TABLET | ORAL | Status: DC

## 2015-07-31 MED FILL — Morphine Sulfate Inj 10 MG/ML: INTRAMUSCULAR | Qty: 1 | Status: AC

## 2015-07-31 NOTE — Progress Notes (Signed)
CARDIAC REHAB PHASE I   PRE:  Rate/Rhythm: 83 SR    BP: sitting 121/69    SaO2:    MODE:  Ambulation: 500 ft   POST:  Rate/Rhythm: 108 ST    BP: sitting 128/71     SaO2:   Tolerated well with slow pace. Denied c/o. HR elevated but stable. Ed completed with good reception. Interested in [place] and will send referral to [place]. Understands importance of Effient daily. [place], [place], ACSM 07/31/2015 9:27 AM

## 2015-07-31 NOTE — Discharge Summary (Signed)
Discharge Summary   Patient ID: [name],  MRN: [id number], DOB/AGE: 67/12/1945 67 y.o.  Admit date: 07/29/2015 Discharge date: 07/31/2015  Primary Care Provider: Penni Homans Primary Cardiologist: Kirk Ruths MD  Discharge Diagnoses Principal Problem:   STEMI (ST elevation myocardial infarction) [facility]) Active Problems:   Human immunodeficiency virus (HIV) disease ([place])   DM (diabetes mellitus), type 2 with ophthalmic complications ([place])   Essential hypertension   CORONARY ARTERY BYPASS GRAFT, HX OF   HLD (hyperlipidemia)   Subsequent ST elevation (STEMI) myocardial infarction of inferior wall ([facility])   COPD   GERD  Allergies No Known Allergies  Consultant: None  Procedures  Procedures  07/29/2015   Coronary Stent Intervention   Left Heart Cath and Coronary Angiography    Conclusion     Prox LAD to Mid LAD lesion, 100% stenosed.  Ost Ramus to Ramus lesion, 100% stenosed.  Ost Cx to Prox Cx lesion, 100% stenosed.  Prox RCA lesion, 100% stenosed.  LIMA to the LAD was injected is normal in caliber.  SVG to the first diagonal was injected . There is an 85% stenosis at the distal anastomosis  Sequential SVG to the ramus intermediate and first OM was injected is normal in caliber, and is anatomically normal.  Mid Cx lesion, 100% stenosed.  Sequential SVG to the left PDA and PL branch was injected . There is a 99% stenosis in the distal SVG.  Dist Graft lesion, before LPDA, 99% stenosed. Post intervention, there is a 0% residual stenosis.  1. Severe 3 vessel obstructive CAD 2. Patent LIMA to the LAD 3. Patent SVG to the diagonal with 85% stenosis at the distal insertion. 4. Patent sequential SVG to the ramus intermediate and first OM 5. Severe subtotal stenosis of the distal SVG to left PDA and PL. Successful stenting with DES  LV gram was not done to conserve contrast load.  Plan: DAPT for one year. If he has recurrent angina the distal SVG to  the diagonal would be suitable for PCI. Hold ACEi and diuretics and monitor renal function closely. If no complications he may be a candidate for fast track discharge.       Echo 07/29/15:Study Conclusions  - Left ventricle: The cavity size was normal. Wall thickness was increased in a pattern of moderate LVH. Systolic function was normal. The estimated ejection fraction was in the range of 55% to 60%. Wall motion was normal; there were no regional wall motion abnormalities. Features are consistent with a pseudonormal left ventricular filling pattern, with concomitant abnormal relaxation and increased filling pressure (grade 2 diastolic dysfunction). - Aortic valve: Calcification. Right coronary cusp mobility was restricted/fused. There was mild stenosis. Valve area (VTI): 1.76 cm^2. Valve area (Vmax): 1.69 cm^2. Valve area (Vmean): 1.71 cm^2. - Mitral valve: Calcified annulus. There was mild regurgitation. - Left atrium: The atrium was mildly dilated. - Pulmonary arteries: PA peak pressure: 31 mm Hg (S).   History of Present Illness  This is a 69 y.o. male with known history of CAD s/p [facility]G in 05/2002 [facility], HTN, DM2, CKD stage 3, prior history of AKI requiring intermittent dialysis, GERD, HIV, COPD, HLD, depression and pancreatitis who presented with chest pain 07/28/2016 to the ED.   Patient said that he has upper abdominal and chest pain for several hours. It is sharp in character, and associated with SOB, diaphoresis, nausea and 1 x episode vomiting. STEMI was called around 2:15 AM. Patient described chest pain as 10/10 in intensity,  non relieving with medications. He remained hemodynamically stable. The chest pain started at 1AM and woke him up from sleeping.  Hospital Course  The patient taken for Emergent cardiac catheterization showed stenosis in SVG to PDA/PL branch. S/p DES stent. No recurrent chest pain. Peak troponin 41.94. post procedure Ecg  showed resolution of ST elevation. . Echo shows normal LV function. The patient initially placed on Brilinta however there was concern for interaction with antiretroviral therapy. Pharmacy recommended Prasugrel and felt that his bleeding risk may be a little higher given small patient size. Could consider outpatient P2Y12 testing eventually. No CP on ambulation. ACEi and diuretics on hold due to increased creatinine that was stable at day of discharge. BB was not started due to soft BP and should be considered at discharge.   She has been seen by Dr. Radford Pax today and deemed ready for discharge home. All follow-up appointments have been scheduled. Discharge medications are listed below.   Resume home dose of ACE and diuretic (lasix and metolazone and well as Kdur) as needed. Check renal function during TCM appointment. Protonix for GI bleed prevention.   Discharge Vitals Blood pressure 112/66, pulse 94, temperature 98.3 F (36.8 C), temperature source Oral, resp. rate 18, height 5\' 3"  (1.6 m), weight 125 lb 3.5 oz (56.8 kg), SpO2 99 %.  Filed Weights   07/29/15 0430 07/30/15 1010 07/31/15 0513  Weight: 116 lb 2.9 oz (52.7 kg) 116 lb 13.5 oz (53 kg) 125 lb 3.5 oz (56.8 kg)    Labs  CBC  Recent Labs  07/29/15 0215 07/29/15 0515  WBC 8.1 9.9  HGB 11.6* 10.3*  HCT 33.7* 29.1*  MCV 88.7 88.4  PLT 208 0000000   Basic Metabolic Panel  Recent Labs  07/29/15 0515 07/31/15 0505  NA 139 141  K 3.4* 3.9  CL 103 108  CO2 24 23  GLUCOSE 222* 106*  BUN 33* 26*  CREATININE 1.68* 1.66*  CALCIUM 8.9 8.7*   Liver Function Tests  Recent Labs  07/29/15 0215  AST 27  ALT 25  ALKPHOS 90  BILITOT 0.5  PROT 7.7  ALBUMIN 4.2   Cardiac Enzymes  Recent Labs  07/30/15 1658 07/30/15 2225 07/31/15 0505  TROPONINI 10.05* 9.82* 7.44*    Disposition  Pt is being discharged home today in good condition.  Follow-up Plans & Appointments  Follow-up Information    Follow up with  SIMMONS, BRITTAINY, PA-C. Go on 08/07/2015.   Specialties:  Cardiology, Radiology   Why:  @9 :30 for TCM appoinment   Contact information:   [place] [address] [phone]       Discharge Instructions    AMB Referral to Cardiac Rehabilitation - Phase II    Complete by:  As directed   Diagnosis:  Myocardial Infarction     Diet - low sodium heart healthy    Complete by:  As directed      Discharge instructions    Complete by:  As directed   No driving for 2 weeks. No lifting over 10 lbs for 4 weeks. No sexual activity for 4 weeks. You may not return to work after seen in clinic next week. Keep procedure site clean & dry. If you notice increased pain, swelling, bleeding or pus, call/return!  You may shower, but no soaking baths/hot tubs/pools for 1 week.     Increase activity slowly    Complete by:  As directed  F/u Labs/Studies: BEMT during TCM appoinment  Discharge Medications    Medication List    STOP taking these medications        omeprazole 20 MG capsule  Commonly known as:  PRILOSEC  Replaced by:  pantoprazole 40 MG tablet      TAKE these medications        amLODipine 10 MG tablet  Commonly known as:  NORVASC  Take 1 tablet (10 mg total) by mouth at bedtime.     aspirin 81 MG chewable tablet  Chew 81 mg by mouth daily.     atorvastatin 80 MG tablet  Commonly known as:  LIPITOR  Take 1 tablet (80 mg total) by mouth daily.     b complex vitamins tablet  Take 1 tablet by mouth daily.     Cholecalciferol 2000 UNITS Caps  Take 1 capsule (2,000 Units total) by mouth daily.     clotrimazole-betamethasone cream  Commonly known as:  LOTRISONE  Apply 1 application topically 2 (two) times daily. apply to lesion on chest wall     Darunavir Ethanolate 800 MG tablet  Commonly known as:  PREZISTA  Take 1 tablet (800 mg total) by mouth daily.     dolutegravir 50 MG tablet  Commonly known as:  TIVICAY  Take 1 tablet (50 mg  total) by mouth daily.     furosemide 20 MG tablet  Commonly known as:  LASIX  TAKE 2 TABLETS BY MOUTH DAILY AS NEEDED FOR FLUID OR EDEMA     Krill Oil Omega-3 300 MG Caps  Take 1 capsule daily     lisinopril 5 MG tablet  Commonly known as:  PRINIVIL,ZESTRIL  Take 1/2 by mouth daily     metolazone 2.5 MG tablet  Commonly known as:  ZAROXOLYN  TAKE 1 TABLET BY MOUTH EVERY DAY 20 MINUTES PRIOR TO LASIX     nitroGLYCERIN 0.4 MG SL tablet  Commonly known as:  NITROSTAT  Place 1 tablet (0.4 mg total) under the tongue every 5 (five) minutes as needed for chest pain.     NORVIR 100 MG Tabs tablet  Generic drug:  ritonavir  TAKE 1 TABLET BY MOUTH EVERY DAY     pantoprazole 40 MG tablet  Commonly known as:  PROTONIX  Take 1 tablet (40 mg total) by mouth daily.     potassium chloride SA 20 MEQ tablet  Commonly known as:  K-DUR,KLOR-CON  Take  40mg  by mouth on the day of lasix.     prasugrel 10 MG Tabs tablet  Commonly known as:  EFFIENT  Take 1 tablet (10 mg total) by mouth daily with supper.     PROBIOTIC DAILY Caps  Take 1 by mouth daily     repaglinide 2 MG tablet  Commonly known as:  PRANDIN  TAKE 2 TABLETS BY MOUTH BEFORE BREAKFAST, 1 TABLET BY MOUTH BEFORE LUNCH AND 2 TABLETS BY MOUTH BEFORE SUPPER     rilpivirine 25 MG Tabs tablet  Commonly known as:  EDURANT  Take 1 tablet (25 mg total) by mouth daily with breakfast.     sitaGLIPtin 50 MG tablet  Commonly known as:  JANUVIA  Take 1 tablet (50 mg total) by mouth daily.     sodium bicarbonate 650 MG tablet  Take 650 mg by mouth 3 (three) times daily.     valACYclovir 500 MG tablet  Commonly known as:  VALTREX  Take 1 tablet (500 mg total) by mouth 2 (two) times daily.  Duration of Discharge Encounter   Greater than 30 minutes including physician time.  Signed, Bhagat,Bhavinkumar PA-C 07/31/2015, 9:29 AM  Agree with discharge summary as outlined by  Berle Mull PA.  Signed: Fransico Him, MD  [facility]

## 2015-07-31 NOTE — Patient Outreach (Signed)
[place] [facility]) Care Management  07/31/2015  [name] 1945-08-08 [id number]  Patient was admitted on 07-29-15 with an MI. To be discharged today (07-31-15).   Referral made for Transition of Care.  Candie Mile, RN, MSN [place] [phone] Fax [phone]

## 2015-07-31 NOTE — Telephone Encounter (Signed)
TCM pt- appt with Brittiany  08-07-15 at 930a

## 2015-07-31 NOTE — Progress Notes (Addendum)
SUBJECTIVE:  No complaints  OBJECTIVE:   Vitals:   Filed Vitals:   07/30/15 1010 07/30/15 1336 07/30/15 2021 07/31/15 0513  BP: 118/60 118/77 120/70 112/66  Pulse:  90 94   Temp: 98 F (36.7 C) 98.5 F (36.9 C) 98.5 F (36.9 C) 98.3 F (36.8 C)  TempSrc: Oral Oral Oral Oral  Resp: 22 21 18 18   Height:      Weight: 116 lb 13.5 oz (53 kg)   125 lb 3.5 oz (56.8 kg)  SpO2: 100% 100% 97% 99%   I&O's:   Intake/Output Summary (Last 24 hours) at 07/31/15 0831 Last data filed at 07/31/15 0516  Gross per 24 hour  Intake    720 ml  Output    425 ml  Net    295 ml   TELEMETRY: Reviewed telemetry pt in NSR:     PHYSICAL EXAM General: Well developed, well nourished, in no acute distress Head: Eyes PERRLA, No xanthomas.   Normal cephalic and atramatic  Lungs:   Clear bilaterally to auscultation and percussion. Heart:   HRRR S1 S2 Pulses are 2+ & equal. Abdomen: Bowel sounds are positive, abdomen soft and non-tender without masses  Extremities:   No clubbing, cyanosis or edema.  DP +1 Neuro: Alert and oriented X 3. Psych:  Good affect, responds appropriately   LABS: Basic Metabolic Panel:  Recent Labs  07/29/15 0515 07/31/15 0505  NA 139 141  K 3.4* 3.9  CL 103 108  CO2 24 23  GLUCOSE 222* 106*  BUN 33* 26*  CREATININE 1.68* 1.66*  CALCIUM 8.9 8.7*   Liver Function Tests:  Recent Labs  07/29/15 0215  AST 27  ALT 25  ALKPHOS 90  BILITOT 0.5  PROT 7.7  ALBUMIN 4.2   No results for input(s): LIPASE, AMYLASE in the last 72 hours. CBC:  Recent Labs  07/29/15 0215 07/29/15 0515  WBC 8.1 9.9  HGB 11.6* 10.3*  HCT 33.7* 29.1*  MCV 88.7 88.4  PLT 208 152   Cardiac Enzymes:  Recent Labs  07/30/15 1658 07/30/15 2225 07/31/15 0505  TROPONINI 10.05* 9.82* 7.44*   BNP: Invalid input(s): POCBNP D-Dimer: No results for input(s): DDIMER in the last 72 hours. Hemoglobin A1C: No results for input(s): HGBA1C in the last 72 hours. Fasting Lipid  Panel: No results for input(s): CHOL, HDL, LDLCALC, TRIG, CHOLHDL, LDLDIRECT in the last 72 hours. Thyroid Function Tests: No results for input(s): TSH, T4TOTAL, T3FREE, THYROIDAB in the last 72 hours.  Invalid input(s): FREET3 Anemia Panel: No results for input(s): VITAMINB12, FOLATE, FERRITIN, TIBC, IRON, RETICCTPCT in the last 72 hours. Coag Panel:   Lab Results  Component Value Date   INR 0.99 07/29/2015   INR 1.32 04/03/2014   INR 1.56* 03/23/2014    RADIOLOGY: No results found.   ASSESSMENT AND PLAN: 1. Inferior STEMI secondary to stenosis in SVG to PDA/PL branch. S/p DES stent. No recurrent chest pain. Peak troponin 41.94.  Echo shows normal LV function. Continue DAPT. Changed to Effient due to interactions of Brilinta with HIV meds. 2. Mild aortic stenosis 3. HIV postitive 4. DM type 2  5. HTN controlled. ACEi and diuretics on hold due to increased creatinine.   BP well controlled on amlodipine and metoprolol.  Restart ACE I at prior home dose.  6. CKD stage 3. Renal function better this am.  7. Hyperlipidemia. On high dose statin with lipitor.  Patient is stable from a cardiac standpoint for discharge home.  Sueanne Margarita, MD  07/31/2015  8:31 AM

## 2015-08-01 NOTE — Telephone Encounter (Signed)
lmtcb

## 2015-08-01 NOTE — Telephone Encounter (Signed)
Patient contacted regarding discharge from [facility] on 07/2715.  Patient understands to follow up with provider Lyda Jester, PA-C on 08/07/2015 at 2:30p at [facility]. Patient understands discharge instructions? yes Patient understands medications and regiment? yes Patient understands to bring all medications to this visit? yes

## 2015-08-02 ENCOUNTER — Ambulatory Visit (INDEPENDENT_AMBULATORY_CARE_PROVIDER_SITE_OTHER): Payer: Medicare Other | Admitting: Podiatry

## 2015-08-02 ENCOUNTER — Encounter: Payer: Self-pay | Admitting: Internal Medicine

## 2015-08-02 ENCOUNTER — Encounter: Payer: Self-pay | Admitting: Podiatry

## 2015-08-02 ENCOUNTER — Other Ambulatory Visit: Payer: Self-pay | Admitting: *Deleted

## 2015-08-02 VITALS — BP 120/67 | HR 77

## 2015-08-02 DIAGNOSIS — M21962 Unspecified acquired deformity of left lower leg: Secondary | ICD-10-CM

## 2015-08-02 DIAGNOSIS — M25572 Pain in left ankle and joints of left foot: Secondary | ICD-10-CM

## 2015-08-02 DIAGNOSIS — I2510 Atherosclerotic heart disease of native coronary artery without angina pectoris: Secondary | ICD-10-CM | POA: Diagnosis not present

## 2015-08-02 DIAGNOSIS — L570 Actinic keratosis: Secondary | ICD-10-CM

## 2015-08-02 DIAGNOSIS — E876 Hypokalemia: Secondary | ICD-10-CM

## 2015-08-02 NOTE — Patient Outreach (Signed)
[name]) Care Management  08/02/2015  [name] 02-05-1946 [id number]   Transition of care (week 1)  RN spoke with pt today and explained [facility] services and the purpose of today's call. Pt familiar from recent health coach. Pt able to verify his discharge orders and follow up appointments however no appointment for his primary care doctor. RN strongly encouraged pt to call and inquired on a hospital post-op appointment if needed. Pt indicated he would call and inquire. Pt states he is doing greatly with no major issues since having the STENT placed. Reports his diabetes is improving with no issues mentioned at this time. RN offered community home visits if needed to further educate on his ongoing medical issues related to his diabetes however pt feels he is doing well at this time. RN offered to continue telephone follow up calls. Pt was receptive as RN scheduled the next follow up call for 1/3 for ongoing transition of care calls to inquire further on pt's progress.   Raina Mina, RN Care Management Coordinator [facility] Main Office [phone]

## 2015-08-02 NOTE — Progress Notes (Signed)
SUBJECTIVE: 69 y.o. year old male presents for diabetic foot care.  Blood sugar is under control. Still having problem with painful callus under left foot.   OBJECTIVE:  DERMATOLOGIC EXAMINATION: Nails: normal appearing nails bilaterally Positive of plantar callus under 5th MPJ left. VASCULAR EXAMINATION OF LOWER LIMBS: Pedal pulses: Not palpable on both feet. NEUROLOGIC EXAMINATION OF THE LOWER LIMBS: All epicritic and tactile sensations grossly intact.  MUSCULOSKELETAL EXAMINATION: Positive of plantar flexed 5th met left foot.  Normal range of motion on ankle and forefoot joint bilateral. Thin plantar ball fat pad.   ASSESSMENT: Plantar flexed 5th met with plantar callus. Loss of plantar fat pad. Painful feet bilateral.   PLAN: Reviewed clinical findings and available treatment options. All calluses debrided. Return in 6 months or as needed.

## 2015-08-02 NOTE — Patient Instructions (Signed)
Doing well with diabetic shoes. Callus under left foot debrided. Return in 6 month for follow up.

## 2015-08-07 ENCOUNTER — Other Ambulatory Visit: Payer: Self-pay | Admitting: *Deleted

## 2015-08-07 ENCOUNTER — Ambulatory Visit (INDEPENDENT_AMBULATORY_CARE_PROVIDER_SITE_OTHER): Payer: Medicare Other | Admitting: Cardiology

## 2015-08-07 ENCOUNTER — Other Ambulatory Visit: Payer: Medicare Other

## 2015-08-07 ENCOUNTER — Encounter: Payer: Self-pay | Admitting: Cardiology

## 2015-08-07 VITALS — BP 110/66 | HR 81 | Ht 63.0 in | Wt 129.0 lb

## 2015-08-07 DIAGNOSIS — N289 Disorder of kidney and ureter, unspecified: Secondary | ICD-10-CM

## 2015-08-07 LAB — BASIC METABOLIC PANEL
BUN: 40 mg/dL — AB (ref 7–25)
CO2: 24 mmol/L (ref 20–31)
Calcium: 9 mg/dL (ref 8.6–10.3)
Chloride: 96 mmol/L — ABNORMAL LOW (ref 98–110)
Creat: 1.55 mg/dL — ABNORMAL HIGH (ref 0.70–1.25)
Glucose, Bld: 187 mg/dL — ABNORMAL HIGH (ref 65–99)
POTASSIUM: 3 mmol/L — AB (ref 3.5–5.3)
SODIUM: 133 mmol/L — AB (ref 135–146)

## 2015-08-07 NOTE — Patient Outreach (Signed)
[facility]) Care Management  08/07/2015  [name] [birth year] [id number]  Transition of care (week 2)  RN spoke with pt today who reports she is doing "well" with no residual issues related to his recently placed stent. States his diabetes continues to improve and his follow up appointments reported for both his cardiologist and her primary with the next 2 weeks.  Reports he is taking his medications as prescribed with no problems and receptive to ongoing transition of care contacts with a scheduled call for Monday.  No other inquires or request at this time.   Raina Mina, RN Care Management Coordinator [place] Office [phone]

## 2015-08-07 NOTE — Patient Instructions (Signed)
Medication Instructions:  Your physician recommends that you continue on your current medications as directed. Please refer to the Current Medication list given to you today.   Labwork: TODAY BMET  Testing/Procedures: NONE  Follow-Up: 4-6 WEEKS WITH DR. CRENSHAW; S/P STEMI  Any Other Special Instructions Will Be Listed Below (If Applicable).   If you need a refill on your cardiac medications before your next appointment, please call your pharmacy.

## 2015-08-07 NOTE — Progress Notes (Signed)
08/07/2015 [name]   March 20, 1946  [id number]  Primary Physician Penni Homans, MD Primary Cardiologist: Dr. Stanford Breed   Reason for Visit/CC: [facility] F/u for CAD/STEMI  HPI:  [name] presents to clinic for post hospital f/u after being admitted for ACS. He is a 70 y.o. Male, followed by Dr. Stanford Breed, with known history of CAD s/p CABG in 05/2002 at [facility], HTN, DM2, CKD stage 3, prior history of AKI requiring intermittent dialysis, GERD, HIV, COPD, HLD, depression and pancreatitis who presented with chest pain 07/28/2016 to the ED. He ruled in for STEMI with troponin peaking at 41 and was taken to the cath lab for emergent revascularization. Procedure was performed by Dr. Martinique. He was found to have stenosis in the SVG to PDA/PL branch. S/p DES stent. Also with residual 85% distal stenosis of the SVG to the diag, treated medically. EF was preserved at 55-60%. He was initially placed on ASA + Brilinta, however Brilinta was discontinued due to interaction with his antiretroviral therapy. He was changed to Effient. He was also continued on high dose statin therapy, ACE-I and daily lasix. Scr day of discharge was 1.66. F/u BMET at time of office f/u was recommended.   Today in f/u he reports that he has done fairly well. He denies any recurrent angina. No dyspnea. He reports full medication compliance with ASA + Effient. No abnormal bleeding. He has not required use of SL NTG. He has no complaints today.    Current Outpatient Prescriptions  Medication Sig Dispense Refill  . amLODipine (NORVASC) 10 MG tablet Take 1 tablet (10 mg total) by mouth at bedtime. 90 tablet 1  . aspirin 81 MG chewable tablet Chew 81 mg by mouth daily.    Marland Kitchen atorvastatin (LIPITOR) 80 MG tablet Take 1 tablet (80 mg total) by mouth daily. 90 tablet 3  . b complex vitamins tablet Take 1 tablet by mouth daily. 30 tablet 11  . Cholecalciferol 2000 UNITS CAPS Take 1 capsule (2,000 Units total) by mouth daily. 30 each 11    . clotrimazole-betamethasone (LOTRISONE) cream Apply 1 application topically 2 (two) times daily. apply to lesion on chest wall 45 g 1  . Darunavir Ethanolate (PREZISTA) 800 MG tablet Take 1 tablet (800 mg total) by mouth daily. 30 tablet 6  . dolutegravir (TIVICAY) 50 MG tablet Take 1 tablet (50 mg total) by mouth daily. 30 tablet 11  . furosemide (LASIX) 20 MG tablet Take 40 mg by mouth daily.    Astrid Drafts Omega-3 300 MG CAPS Take 1 capsule daily 30 capsule 11  . lisinopril (PRINIVIL,ZESTRIL) 5 MG tablet Take 2.5 mg by mouth every other day.    . metolazone (ZAROXOLYN) 2.5 MG tablet TAKE 1 TABLET BY MOUTH EVERY DAY 20 MINUTES PRIOR TO LASIX 30 tablet 6  . nitroGLYCERIN (NITROSTAT) 0.4 MG SL tablet Place 1 tablet (0.4 mg total) under the tongue every 5 (five) minutes as needed for chest pain. 25 tablet 12  . NORVIR 100 MG TABS tablet TAKE 1 TABLET BY MOUTH EVERY DAY 30 tablet 6  . pantoprazole (PROTONIX) 40 MG tablet Take 1 tablet (40 mg total) by mouth daily. 30 tablet 11  . potassium chloride SA (K-DUR,KLOR-CON) 20 MEQ tablet Take  40mg  by mouth on the day of lasix. 60 tablet 2  . prasugrel (EFFIENT) 10 MG TABS tablet Take 1 tablet (10 mg total) by mouth daily with supper. 30 tablet 6  . Probiotic Product (PROBIOTIC DAILY) CAPS Take  1 by mouth daily 30 capsule 11  . repaglinide (PRANDIN) 2 MG tablet TAKE 2 TABLETS BY MOUTH BEFORE BREAKFAST, 1 TABLET BY MOUTH BEFORE LUNCH AND 2 TABLETS BY MOUTH BEFORE SUPPER 150 tablet 1  . rilpivirine (EDURANT) 25 MG TABS tablet Take 1 tablet (25 mg total) by mouth daily with breakfast. 30 tablet 11  . sitaGLIPtin (JANUVIA) 50 MG tablet Take 1 tablet (50 mg total) by mouth daily. 30 tablet 3  . sodium bicarbonate 650 MG tablet Take 650 mg by mouth 3 (three) times daily.    . valACYclovir (VALTREX) 500 MG tablet Take 1 tablet by mouth daily.  5   No current facility-administered medications for this visit.    No Known Allergies  Social History    Social History  . Marital Status: Divorced    Spouse Name: N/A  . Number of Children: 4  . Years of Education: N/A   Occupational History  . 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    Social History Main Topics  . Smoking status: Never Smoker   . Smokeless tobacco: Never Used  . Alcohol Use: No  . Drug Use: No  . Sexual Activity: Not Currently     Comment: declined condoms   Other Topics Concern  . Not on file   Social History Narrative   Lives alone.  Supportive friends and family.  His HIV Dx is not a secret.      Review of Systems: General: negative for chills, fever, night sweats or weight changes.  Cardiovascular: negative for chest pain, dyspnea on exertion, edema, orthopnea, palpitations, paroxysmal nocturnal dyspnea or shortness of breath Dermatological: negative for rash Respiratory: negative for cough or wheezing Urologic: negative for hematuria Abdominal: negative for nausea, vomiting, diarrhea, bright red blood per rectum, melena, or hematemesis Neurologic: negative for visual changes, syncope, or dizziness All other systems reviewed and are otherwise negative except as noted above.    Blood pressure 110/66, pulse 81, height 5\' 3"  (1.6 m), weight 129 lb (58.514 kg), SpO2 98 %.  General appearance: alert, cooperative and no distress Neck: no carotid bruit and no JVD Lungs: clear to auscultation bilaterally Heart: regular rate and rhythm and 2/6 SM along LSB and apex Extremities: 1+ bilateral LEE Pulses: 2+ and symmetric Skin: warm and dry Neurologic: Grossly normal  EKG not performed  ASSESSMENT AND PLAN:   1. S/P STEMI: s/p PCI to the SVG to PDA/PL. Denies any recurrent angina. Continue DAPT w/ ASA + Effient for a minimum of 1 year given DES. Continue high dose statin.   2. CAD: s/p recent PCI as mentioned above, also with residual 85% stenosis of the distal SVG to diag 1. He denies any anginal symptoms. Per Dr.  Martinique, PCI can be considered if any recurrent CP. Continue medical therapy. He has SL NTG for PRN use. Proper NTG use was reviewed.   3. Chronic Renal Insufficiency: SCr day of discharge was 1.66. He is currently on lisinopril 5 mg every other day and takes 40 of lasix daily. Will obtain a f/u BMP to reassess renal function.     PLAN  Patient is doing well from a cardiac standpoint, w/o recurrent agnina. VSS. F/u with Dr. Stanford Breed in 4-6 weeks.   Lyda Jester PA-C 08/07/2015 9:25 AM

## 2015-08-09 ENCOUNTER — Ambulatory Visit: Payer: Medicare Other

## 2015-08-10 DIAGNOSIS — E785 Hyperlipidemia, unspecified: Secondary | ICD-10-CM | POA: Diagnosis not present

## 2015-08-10 DIAGNOSIS — I2510 Atherosclerotic heart disease of native coronary artery without angina pectoris: Secondary | ICD-10-CM | POA: Diagnosis not present

## 2015-08-10 LAB — BASIC METABOLIC PANEL
BUN: 33 mg/dL — ABNORMAL HIGH (ref 7–25)
CO2: 25 mmol/L (ref 20–31)
Calcium: 9.1 mg/dL (ref 8.6–10.3)
Chloride: 99 mmol/L (ref 98–110)
Creat: 1.54 mg/dL — ABNORMAL HIGH (ref 0.70–1.25)
Glucose, Bld: 126 mg/dL — ABNORMAL HIGH (ref 65–99)
POTASSIUM: 3.7 mmol/L (ref 3.5–5.3)
SODIUM: 137 mmol/L (ref 135–146)

## 2015-08-10 LAB — HEPATIC FUNCTION PANEL
ALT: 21 U/L (ref 9–46)
AST: 16 U/L (ref 10–35)
Albumin: 4.3 g/dL (ref 3.6–5.1)
Alkaline Phosphatase: 77 U/L (ref 40–115)
Bilirubin, Direct: 0.1 mg/dL (ref <=0.2)
Indirect Bilirubin: 0.3 mg/dL (ref 0.2–1.2)
TOTAL PROTEIN: 7 g/dL (ref 6.1–8.1)
Total Bilirubin: 0.4 mg/dL (ref 0.2–1.2)

## 2015-08-10 LAB — LIPID PANEL
CHOL/HDL RATIO: 3.9 ratio (ref <=5.0)
CHOLESTEROL: 117 mg/dL — AB (ref 125–200)
HDL: 30 mg/dL — ABNORMAL LOW (ref >=40)
LDL CALC: 55 mg/dL (ref <130)
TRIGLYCERIDES: 160 mg/dL — AB (ref <150)
VLDL: 32 mg/dL — AB (ref <30)

## 2015-08-10 MED ORDER — POTASSIUM CHLORIDE CRYS ER 20 MEQ PO TBCR: EXTENDED_RELEASE_TABLET | ORAL | Status: DC

## 2015-08-10 NOTE — Telephone Encounter (Signed)
Patient called back about lab results. Per Lyda Jester PA, His renal function is improved. His potassium level is low at 3.0. He has been taking lasix daily and was ordered to take 40 mEq of potassium daily. Make sure patient is taking potassium correctly. If he is, add 20 mEq at night and recheck a BMP + Mg level in 5-7 days. Patient verbalized understanding. Ordered BMET and patient will come in 08/16/2015.

## 2015-08-13 ENCOUNTER — Other Ambulatory Visit: Payer: Self-pay | Admitting: *Deleted

## 2015-08-13 NOTE — Patient Outreach (Signed)
[facility]) Care Management  08/13/2015  [name] Apr 02, 1946 [id number]  Transition of care (week 3)  RN spoke with pt today who indicates he is doing well with no reported issues or events. Pt reported visiting with the PA at his CAD office on 1/3 and will follow up with the cardiologist next week. Pt also reported a follow up appointment with his primary on 1/13. States no problems with this new placed stent and his CBG are good with a reported level this morning of 119 before breakfast and after breakfast it was 169. Pt states this has resulted in him "cutting back" on portion sizes and he is feeling "much better". Pt feels his numbers are improving and will continue to improve his eating habits. RN praise pt for improving his habits resulting in improved CBG and management of his diabetes. Will continue to encouraged adherence. Pt has agreed to ongoing telephonic calls however continues to declined home visits for further education face-to-face concerning his diabetes. RN also discuss possible referral for health coaching however pt has opt to decline this offer at this time. RN scheduled another transition of care call next week and will follow up accordingly.  Raina Mina, RN Care Management Coordinator [place] Office [phone]

## 2015-08-16 ENCOUNTER — Other Ambulatory Visit (INDEPENDENT_AMBULATORY_CARE_PROVIDER_SITE_OTHER): Payer: Medicare Other | Admitting: *Deleted

## 2015-08-16 DIAGNOSIS — E876 Hypokalemia: Secondary | ICD-10-CM | POA: Diagnosis not present

## 2015-08-16 LAB — BASIC METABOLIC PANEL
BUN: 29 mg/dL — ABNORMAL HIGH (ref 7–25)
CALCIUM: 9.3 mg/dL (ref 8.6–10.3)
CO2: 23 mmol/L (ref 20–31)
Chloride: 100 mmol/L (ref 98–110)
Creat: 1.43 mg/dL — ABNORMAL HIGH (ref 0.70–1.25)
GLUCOSE: 107 mg/dL — AB (ref 65–99)
Potassium: 3.7 mmol/L (ref 3.5–5.3)
SODIUM: 135 mmol/L (ref 135–146)

## 2015-08-16 NOTE — Addendum Note (Signed)
Addended by: Eulis Foster on: 08/16/2015 08:20 AM   Modules accepted: Orders

## 2015-08-17 ENCOUNTER — Encounter: Payer: Self-pay | Admitting: Family Medicine

## 2015-08-17 ENCOUNTER — Ambulatory Visit (INDEPENDENT_AMBULATORY_CARE_PROVIDER_SITE_OTHER): Payer: Medicare Other | Admitting: Family Medicine

## 2015-08-17 VITALS — BP 100/62 | HR 85 | Temp 97.7°F | Ht 63.0 in | Wt 124.2 lb

## 2015-08-17 DIAGNOSIS — I2119 ST elevation (STEMI) myocardial infarction involving other coronary artery of inferior wall: Secondary | ICD-10-CM | POA: Diagnosis not present

## 2015-08-17 DIAGNOSIS — E785 Hyperlipidemia, unspecified: Secondary | ICD-10-CM

## 2015-08-17 DIAGNOSIS — I10 Essential (primary) hypertension: Secondary | ICD-10-CM | POA: Diagnosis not present

## 2015-08-17 MED ORDER — POTASSIUM CHLORIDE CRYS ER 20 MEQ PO TBCR: 20.0000 meq | EXTENDED_RELEASE_TABLET | Freq: Three times a day (TID) | ORAL | Status: DC

## 2015-08-17 NOTE — Progress Notes (Signed)
Pre visit review using our clinic review tool, if applicable. No additional management support is needed unless otherwise documented below in the visit note. 

## 2015-08-17 NOTE — Patient Instructions (Signed)

## 2015-08-20 ENCOUNTER — Other Ambulatory Visit: Payer: Self-pay | Admitting: Endocrinology

## 2015-08-20 ENCOUNTER — Other Ambulatory Visit: Payer: Self-pay | Admitting: Family Medicine

## 2015-08-20 MED ORDER — POTASSIUM CHLORIDE CRYS ER 20 MEQ PO TBCR: 20.0000 meq | EXTENDED_RELEASE_TABLET | Freq: Three times a day (TID) | ORAL | Status: DC

## 2015-08-20 NOTE — Telephone Encounter (Signed)
Advise on instructions for Potassium as multiple instructions.  Pharmacy needs clarification

## 2015-08-21 ENCOUNTER — Other Ambulatory Visit: Payer: Self-pay | Admitting: *Deleted

## 2015-08-21 ENCOUNTER — Encounter: Payer: Self-pay | Admitting: *Deleted

## 2015-08-21 ENCOUNTER — Ambulatory Visit: Payer: Self-pay

## 2015-08-21 DIAGNOSIS — A6000 Herpesviral infection of urogenital system, unspecified: Secondary | ICD-10-CM

## 2015-08-21 MED ORDER — POTASSIUM CHLORIDE CRYS ER 20 MEQ PO TBCR: 20.0000 meq | EXTENDED_RELEASE_TABLET | Freq: Three times a day (TID) | ORAL | Status: DC

## 2015-08-21 MED ORDER — VALACYCLOVIR HCL 500 MG PO TABS: 500.0000 mg | ORAL_TABLET | Freq: Every day | ORAL | Status: DC

## 2015-08-21 NOTE — Telephone Encounter (Signed)
Updated potassium in patients chart.  He was told to take three times daily at his last office visit with his cardiologist.   Durene Cal in refills as well as faxed to pharmacy correct instructions to fill.

## 2015-08-21 NOTE — Addendum Note (Signed)
Addended by: Sharon Seller B on: 08/21/2015 01:50 PM   Modules accepted: Orders, Medications

## 2015-08-21 NOTE — Telephone Encounter (Signed)
Please confirm with patient what he was taking when I checked his labs last week. What ever dose he was taking then that is the dose he should stay on since his numbers were normal. Then send in new 30 day supply with 5 rf

## 2015-08-21 NOTE — Patient Outreach (Addendum)
[facility]) Care Management  08/21/2015  [name] 11-19-1945 [id number]   Transition of care (week 4)  RN spoke with pt today who indicates he continues to do well with managing his diabetes. States his numbers continues to improve back to his normal "what my doctor wants me to be". States his Potassium was increased but he will recheck with Dr. Charlett Blake in Feb. Verified pt continues to attend all medical appointments and take all prescribed medications as recommended with no delays or issues at this time. RN offered once again community home visits or a health coach if he feels ongoing education is needed at this time however pt appreciative but opt to declined both at this time. Pt grateful for the services offered. Goals met on plan of care and pt has attended all medical appointments and administered all medications as recommended with no readmits related to diabetes over the last month. Case will be closed.  Raina Mina, RN Care Management Coordinator [place] Office [phone]

## 2015-08-22 ENCOUNTER — Other Ambulatory Visit: Payer: Self-pay | Admitting: Family Medicine

## 2015-08-22 DIAGNOSIS — N649 Disorder of breast, unspecified: Secondary | ICD-10-CM

## 2015-08-23 ENCOUNTER — Other Ambulatory Visit: Payer: Self-pay

## 2015-08-26 ENCOUNTER — Encounter: Payer: Self-pay | Admitting: Family Medicine

## 2015-08-26 NOTE — Assessment & Plan Note (Signed)
Well controlled, no changes to meds. Encouraged heart healthy diet such as the DASH diet and exercise as tolerated.  °

## 2015-08-26 NOTE — Assessment & Plan Note (Signed)
Hospitalized in December with chest pain, nausea and left arm symptoms and was found to have had an MI, he feels well today. Has follow up with cardiology soon. Will seek care if symptoms worsen

## 2015-08-26 NOTE — Assessment & Plan Note (Signed)
Tolerating statin, encouraged heart healthy diet, avoid trans fats, minimize simple carbs and saturated fats. Increase exercise as tolerated 

## 2015-08-26 NOTE — Progress Notes (Signed)
Patient ID: [name], male   DOB: [birth year], 70 y.o.   MRN: [id number]   Subjective:    Patient ID: [name], male    DOB: 07/07/1946, 70 y.o.   MRN: [id number]  Chief Complaint  Patient presents with  . Hospitalization Follow-up    HPI Patient is in today for hospital follow-up. He presented to the hospital on December 27 with nausea, chest pain and left arm paresthesias. He was found to be having a heart attack. Since being home he is feeling much better. No recent or recurrent symptoms. Today he feels well. Denies palp/SOB/HA/congestion/fevers/GI or GU c/o. Taking meds as prescribed  Past Medical History  Diagnosis Date  . Arthritis   . Diabetes mellitus   . History of depression   . GERD (gastroesophageal reflux disease)   . CAD (coronary artery disease)     2003- cabg  . Hypertension   . Hyperlipidemia   . History of kidney stones   . COPD (chronic obstructive pulmonary disease) ([place])   . HIV (human immunodeficiency virus infection) ([name]) 1991    on meds since initial dx.   . Dermatitis 11/27/2012  . Nocturia 03/06/2013  . Epicondylitis 06/05/2013    right  . Pancreatitis 11/2013    attributed to HIV meds.   . Constipation 05/28/2014  . Pain in joint, ankle and foot 01/19/2015  . Chronic kidney disease     Past Surgical History  Procedure Laterality Date  . Coronary artery bypass graft  05/2002  . Vasectomy    . Cardiac catheterization N/A 07/29/2015    Procedure: Left Heart Cath and Coronary Angiography;  Surgeon: [name], MD;  Location: [place] [facility];  Service: Cardiovascular;  Laterality: N/A;  . Cardiac catheterization N/A 07/29/2015    Procedure: Coronary Stent Intervention;  Surgeon: [name], MD;  Location: [place] [facility];  Service: Cardiovascular;  Laterality: N/A;    Family History  Problem Relation Age of Onset  . Arthritis      mother/father/paternal grandparents  . Breast cancer Maternal Aunt     paternal aunt  . Lung  cancer Maternal Aunt   . Hyperlipidemia Mother   . Hyperlipidemia Father   . Heart disease      parents/maternal grandparents/ 2 brothers  . Stroke Paternal Grandmother   . Hypertension Father     paternal grandmother/3 brothers/1 sister  . Mental retardation Sister   . Diabetes Mother     paternal grandparents/1 brother    Social History   Social History  . Marital Status: Divorced    Spouse Name: N/A  . Number of Children: 4  . Years of Education: N/A   Occupational History  . 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    Social History Main Topics  . Smoking status: Never Smoker   . Smokeless tobacco: Never Used  . Alcohol Use: No  . Drug Use: No  . Sexual Activity: Not Currently     Comment: declined condoms   Other Topics Concern  . Not on file   Social History Narrative   Lives alone.  Supportive friends and family.  His HIV Dx is not a secret.     Outpatient Prescriptions Prior to Visit  Medication Sig Dispense Refill  . amLODipine (NORVASC) 10 MG tablet Take 1 tablet (10 mg total) by mouth at bedtime. 90 tablet 1  . aspirin 81 MG chewable tablet Chew 81  mg by mouth daily.    Marland Kitchen atorvastatin (LIPITOR) 80 MG tablet Take 1 tablet (80 mg total) by mouth daily. 90 tablet 3  . b complex vitamins tablet Take 1 tablet by mouth daily. 30 tablet 11  . Cholecalciferol 2000 UNITS CAPS Take 1 capsule (2,000 Units total) by mouth daily. 30 each 11  . clotrimazole-betamethasone (LOTRISONE) cream Apply 1 application topically 2 (two) times daily. apply to lesion on chest wall 45 g 1  . Darunavir Ethanolate (PREZISTA) 800 MG tablet Take 1 tablet (800 mg total) by mouth daily. 30 tablet 6  . dolutegravir (TIVICAY) 50 MG tablet Take 1 tablet (50 mg total) by mouth daily. 30 tablet 11  . furosemide (LASIX) 20 MG tablet Take 40 mg by mouth daily.    Astrid Drafts Omega-3 300 MG CAPS Take 1 capsule daily 30 capsule 11  . lisinopril  (PRINIVIL,ZESTRIL) 5 MG tablet Take 2.5 mg by mouth every other day.    . metolazone (ZAROXOLYN) 2.5 MG tablet TAKE 1 TABLET BY MOUTH EVERY DAY 20 MINUTES PRIOR TO LASIX 30 tablet 6  . nitroGLYCERIN (NITROSTAT) 0.4 MG SL tablet Place 1 tablet (0.4 mg total) under the tongue every 5 (five) minutes as needed for chest pain. 25 tablet 12  . NORVIR 100 MG TABS tablet TAKE 1 TABLET BY MOUTH EVERY DAY 30 tablet 6  . pantoprazole (PROTONIX) 40 MG tablet Take 1 tablet (40 mg total) by mouth daily. 30 tablet 11  . prasugrel (EFFIENT) 10 MG TABS tablet Take 1 tablet (10 mg total) by mouth daily with supper. 30 tablet 6  . Probiotic Product (PROBIOTIC DAILY) CAPS Take 1 by mouth daily 30 capsule 11  . rilpivirine (EDURANT) 25 MG TABS tablet Take 1 tablet (25 mg total) by mouth daily with breakfast. 30 tablet 11  . sitaGLIPtin (JANUVIA) 50 MG tablet Take 1 tablet (50 mg total) by mouth daily. 30 tablet 3  . sodium bicarbonate 650 MG tablet Take 650 mg by mouth 3 (three) times daily.    . potassium chloride SA (K-DUR,KLOR-CON) 20 MEQ tablet Take 40 meq by mouth on the day of lasix. Take 20 meq by mouth at night.    . repaglinide (PRANDIN) 2 MG tablet TAKE 2 TABLETS BY MOUTH BEFORE BREAKFAST, 1 TABLET BY MOUTH BEFORE LUNCH AND 2 TABLETS BY MOUTH BEFORE SUPPER 150 tablet 1  . valACYclovir (VALTREX) 500 MG tablet Take 1 tablet by mouth daily.  5   No facility-administered medications prior to visit.    No Known Allergies  Review of Systems  Constitutional: Negative for fever and malaise/fatigue.  HENT: Negative for congestion.   Eyes: Negative for discharge.  Respiratory: Negative for shortness of breath.   Cardiovascular: Positive for chest pain. Negative for palpitations and leg swelling.  Gastrointestinal: Positive for nausea and abdominal pain.  Genitourinary: Negative for dysuria.  Musculoskeletal: Positive for joint pain. Negative for falls.  Skin: Negative for rash.  Neurological: Negative for  loss of consciousness and headaches.  Endo/Heme/Allergies: Negative for environmental allergies.  Psychiatric/Behavioral: Negative for depression. The patient is not nervous/anxious.        Objective:    Physical Exam  Constitutional: He is oriented to person, place, and time. He appears well-developed and well-nourished. No distress.  HENT:  Head: Normocephalic and atraumatic.  Nose: Nose normal.  Eyes: Right eye exhibits no discharge. Left eye exhibits no discharge.  Neck: Normal range of motion. Neck supple.  Cardiovascular: Normal rate and regular rhythm.  Murmur heard. Pulmonary/Chest: Effort normal and breath sounds normal.  Abdominal: Soft. Bowel sounds are normal. There is no tenderness.  Musculoskeletal: He exhibits no edema.  Neurological: He is alert and oriented to person, place, and time.  Skin: Skin is warm and dry.  Psychiatric: He has a normal mood and affect.  Nursing note and vitals reviewed.   BP 100/62 mmHg  Pulse 85  Temp(Src) 97.7 F (36.5 C) (Oral)  Ht 5\' 3"  (1.6 m)  Wt 124 lb 4 oz (56.359 kg)  BMI 22.02 kg/m2  SpO2 99% Wt Readings from Last 3 Encounters:  08/17/15 124 lb 4 oz (56.359 kg)  08/07/15 129 lb (58.514 kg)  07/31/15 125 lb 3.5 oz (56.8 kg)     Lab Results  Component Value Date   WBC 9.9 07/29/2015   HGB 10.3* 07/29/2015   HCT 29.1* 07/29/2015   PLT 152 07/29/2015   GLUCOSE 107* 08/16/2015   CHOL 117* 08/10/2015   TRIG 160* 08/10/2015   HDL 30* 08/10/2015   LDLDIRECT 72.0 04/27/2015   LDLCALC 55 08/10/2015   ALT 21 08/10/2015   AST 16 08/10/2015   NA 135 08/16/2015   K 3.7 08/16/2015   CL 100 08/16/2015   CREATININE 1.43* 08/16/2015   BUN 29* 08/16/2015   CO2 23 08/16/2015   TSH 2.16 04/27/2015   PSA 0.51 03/02/2013   INR 0.99 07/29/2015   HGBA1C 6.4 07/23/2015   MICROALBUR 5.7* 01/19/2015    Lab Results  Component Value Date   TSH 2.16 04/27/2015   Lab Results  Component Value Date   WBC 9.9 07/29/2015   HGB  10.3* 07/29/2015   HCT 29.1* 07/29/2015   MCV 88.4 07/29/2015   PLT 152 07/29/2015   Lab Results  Component Value Date   NA 135 08/16/2015   K 3.7 08/16/2015   CO2 23 08/16/2015   GLUCOSE 107* 08/16/2015   BUN 29* 08/16/2015   CREATININE 1.43* 08/16/2015   BILITOT 0.4 08/10/2015   ALKPHOS 77 08/10/2015   AST 16 08/10/2015   ALT 21 08/10/2015   PROT 7.0 08/10/2015   ALBUMIN 4.3 08/10/2015   CALCIUM 9.3 08/16/2015   ANIONGAP 10 07/31/2015   GFR 42.92* 07/23/2015   Lab Results  Component Value Date   CHOL 117* 08/10/2015   Lab Results  Component Value Date   HDL 30* 08/10/2015   Lab Results  Component Value Date   LDLCALC 55 08/10/2015   Lab Results  Component Value Date   TRIG 160* 08/10/2015   Lab Results  Component Value Date   CHOLHDL 3.9 08/10/2015   Lab Results  Component Value Date   HGBA1C 6.4 07/23/2015       Assessment & Plan:   Problem List Items Addressed This Visit    Essential hypertension - Primary    Well controlled, no changes to meds. Encouraged heart healthy diet such as the DASH diet and exercise as tolerated.       HLD (hyperlipidemia)    Tolerating statin, encouraged heart healthy diet, avoid trans fats, minimize simple carbs and saturated fats. Increase exercise as tolerated      STEMI (ST elevation myocardial infarction) [facility])    Hospitalized in December with chest pain, nausea and left arm symptoms and was found to have had an MI, he feels well today. Has follow up with cardiology soon. Will seek care if symptoms worsen         I have discontinued Mr. Henton's potassium chloride SA. I  am also having him maintain his aspirin, sodium bicarbonate, rilpivirine, dolutegravir, clotrimazole-betamethasone, NORVIR, metolazone, Darunavir Ethanolate, amLODipine, sitaGLIPtin, b complex vitamins, Cholecalciferol, Krill Oil Omega-3, PROBIOTIC DAILY, atorvastatin, nitroGLYCERIN, pantoprazole, prasugrel, furosemide, and lisinopril.  Meds  ordered this encounter  Medications  . DISCONTD: potassium chloride SA (K-DUR,KLOR-CON) 20 MEQ tablet    Sig: Take 1 tablet (20 mEq total) by mouth 3 (three) times daily. Take 40 meq by mouth on the day of lasix. Take 20 meq by mouth at night.    Dispense:  90 tablet    Refill:  2     Penni Homans, MD

## 2015-08-28 ENCOUNTER — Ambulatory Visit
Admission: RE | Admit: 2015-08-28 | Discharge: 2015-08-28 | Disposition: A | Discharge status: Home / Self Care | Payer: Medicare Other | Source: Ambulatory Visit | Attending: Family Medicine | Admitting: Family Medicine

## 2015-08-28 DIAGNOSIS — N649 Disorder of breast, unspecified: Secondary | ICD-10-CM

## 2015-08-28 DIAGNOSIS — R928 Other abnormal and inconclusive findings on diagnostic imaging of breast: Secondary | ICD-10-CM | POA: Diagnosis not present

## 2015-08-30 ENCOUNTER — Ambulatory Visit: Payer: Self-pay | Admitting: Family Medicine

## 2015-08-30 ENCOUNTER — Encounter (HOSPITAL_COMMUNITY)
Admission: RE | Admit: 2015-08-30 | Discharge: 2015-08-30 | Disposition: A | Discharge status: Home / Self Care | Payer: Medicare Other | Source: Ambulatory Visit | Attending: Cardiology | Admitting: Cardiology

## 2015-08-30 DIAGNOSIS — I2119 ST elevation (STEMI) myocardial infarction involving other coronary artery of inferior wall: Secondary | ICD-10-CM | POA: Insufficient documentation

## 2015-08-30 MED ORDER — ATORVASTATIN CALCIUM 20 MG PO TABS: 20.0000 mg | ORAL_TABLET | Freq: Every day | ORAL | Status: DC

## 2015-08-30 NOTE — Progress Notes (Signed)
Cardiac Rehab Medication Review by a Pharmacist  Does the patient  feel that his/her medications are working for him/her?  yes  Has the patient been experiencing any side effects to the medications prescribed?  no  Does the patient measure his/her own blood pressure or blood glucose at home?  yes   Does the patient have any problems obtaining medications due to transportation or finances?   no  Understanding of regimen: excellent Understanding of indications: excellent Potential of compliance: good   Pharmacist comments: DDI with darunavir/ritonavir coadministration with atorvastatin and prasugrel. Patient not experiencing any adverse effects at this time. Spoke with Dr. Stanford Breed and agreed to decrease dose of atorvastatin to 20mg  daily. Pt requires antiplatelet therapy however, close monitoring recommended in the setting of a potential  Decrease in active prasugrel levels.    Judieth Keens, PharmD. 08/30/2015 8:49 AM

## 2015-09-03 ENCOUNTER — Encounter (HOSPITAL_COMMUNITY)
Admission: RE | Admit: 2015-09-03 | Discharge: 2015-09-03 | Disposition: A | Discharge status: Home / Self Care | Payer: Medicare Other | Source: Ambulatory Visit | Attending: Cardiology | Admitting: Cardiology

## 2015-09-03 DIAGNOSIS — I2119 ST elevation (STEMI) myocardial infarction involving other coronary artery of inferior wall: Secondary | ICD-10-CM | POA: Diagnosis not present

## 2015-09-03 LAB — GLUCOSE, CAPILLARY
GLUCOSE-CAPILLARY: 189 mg/dL — AB (ref 65–99)
GLUCOSE-CAPILLARY: 150 mg/dL — AB (ref 65–99)

## 2015-09-03 NOTE — Progress Notes (Signed)
Pt started cardiac rehab today.  Pt tolerated light exercise without difficulty. VSS, telemetry-normal sinus rhythm, asymptomatic.  Medication list reconciled.  Pt verbalized compliance with medications and denies barriers to compliance. PSYCHOSOCIAL ASSESSMENT:  PHQ-0. Pt exhibits positive coping skills, hopeful outlook with supportive family. No psychosocial needs identified at this time, no psychosocial interventions necessary.    Pt enjoys planting flowers in the spring and walking..   Pt cardiac rehab  goal is  to increase his walking distance and to develop an exercise routine .  Pt encouraged to participate in exercising on your own to increase ability to achieve these goals.   Pt long term cardiac rehab goal is to increase exercise capacity and duration .  Pt oriented to exercise equipment and routine.  Understanding verbalized.

## 2015-09-05 ENCOUNTER — Encounter (HOSPITAL_COMMUNITY)
Admission: RE | Admit: 2015-09-05 | Discharge: 2015-09-05 | Disposition: A | Discharge status: Home / Self Care | Payer: Medicare Other | Source: Ambulatory Visit | Attending: Cardiology | Admitting: Cardiology

## 2015-09-05 DIAGNOSIS — I2119 ST elevation (STEMI) myocardial infarction involving other coronary artery of inferior wall: Secondary | ICD-10-CM | POA: Diagnosis not present

## 2015-09-05 LAB — GLUCOSE, CAPILLARY
GLUCOSE-CAPILLARY: 184 mg/dL — AB (ref 65–99)
GLUCOSE-CAPILLARY: 207 mg/dL — AB (ref 65–99)

## 2015-09-07 ENCOUNTER — Encounter (HOSPITAL_COMMUNITY)
Admission: RE | Admit: 2015-09-07 | Discharge: 2015-09-07 | Disposition: A | Discharge status: Home / Self Care | Payer: Medicare Other | Source: Ambulatory Visit | Attending: Cardiology | Admitting: Cardiology

## 2015-09-07 DIAGNOSIS — I2119 ST elevation (STEMI) myocardial infarction involving other coronary artery of inferior wall: Secondary | ICD-10-CM | POA: Diagnosis not present

## 2015-09-07 LAB — GLUCOSE, CAPILLARY
GLUCOSE-CAPILLARY: 194 mg/dL — AB (ref 65–99)
GLUCOSE-CAPILLARY: 193 mg/dL — AB (ref 65–99)

## 2015-09-10 ENCOUNTER — Encounter (HOSPITAL_COMMUNITY)
Admission: RE | Admit: 2015-09-10 | Discharge: 2015-09-10 | Disposition: A | Discharge status: Home / Self Care | Payer: Medicare Other | Source: Ambulatory Visit | Attending: Cardiology | Admitting: Cardiology

## 2015-09-10 DIAGNOSIS — I2119 ST elevation (STEMI) myocardial infarction involving other coronary artery of inferior wall: Secondary | ICD-10-CM | POA: Diagnosis not present

## 2015-09-11 NOTE — Progress Notes (Signed)
HPI: FU coronary artery disease. Patient is status post coronary artery bypass and graft in 2003. Carotid Dopplers December 2016 showed 1-39% bilateral stenosis. Admitted in December 2016 with an acute infarct. Cardiac catheterization revealed severe three-vessel coronary disease. Saphenous vein graft to the diagonal had an 85% lesion. LIMA to the LAD patent. Sequential saphenous vein graft to the ramus and first appears marginal normal. Sequential saphenous vein graft to the PDA and posterior lateral had a 99% stenosis in the distal graft. Patient had PCI of the saphenous vein graft to the PDA and posterior lateral. Echocardiogram December 2016 showed normal LV function, grade 2 diastolic dysfunction, mild aortic stenosis, mild mitral regurgitation and mild left atrial enlargement. Since DC, the patient denies any dyspnea on exertion, orthopnea, PND, pedal edema, palpitations, syncope or chest pain.   Current Outpatient Prescriptions  Medication Sig Dispense Refill  . amLODipine (NORVASC) 10 MG tablet Take 1 tablet (10 mg total) by mouth at bedtime. 90 tablet 1  . aspirin 81 MG chewable tablet Chew 81 mg by mouth daily.    Marland Kitchen atorvastatin (LIPITOR) 20 MG tablet Take 1 tablet (20 mg total) by mouth daily. 90 tablet 2  . b complex vitamins tablet Take 1 tablet by mouth daily. 30 tablet 11  . Cholecalciferol 2000 UNITS CAPS Take 1 capsule (2,000 Units total) by mouth daily. 30 each 11  . clotrimazole-betamethasone (LOTRISONE) cream Apply 1 application topically 2 (two) times daily. apply to lesion on chest wall 45 g 1  . Darunavir Ethanolate (PREZISTA) 800 MG tablet Take 1 tablet (800 mg total) by mouth daily. 30 tablet 6  . dolutegravir (TIVICAY) 50 MG tablet Take 1 tablet (50 mg total) by mouth daily. 30 tablet 11  . furosemide (LASIX) 20 MG tablet Take 40 mg by mouth daily.    Astrid Drafts Omega-3 300 MG CAPS Take 1 capsule daily 30 capsule 11  . lisinopril (PRINIVIL,ZESTRIL) 5 MG tablet Take  2.5 mg by mouth every other day.    . metolazone (ZAROXOLYN) 2.5 MG tablet TAKE 1 TABLET BY MOUTH EVERY DAY 20 MINUTES PRIOR TO LASIX 30 tablet 6  . nitroGLYCERIN (NITROSTAT) 0.4 MG SL tablet Place 1 tablet (0.4 mg total) under the tongue every 5 (five) minutes as needed for chest pain. 25 tablet 12  . NORVIR 100 MG TABS tablet TAKE 1 TABLET BY MOUTH EVERY DAY 30 tablet 6  . pantoprazole (PROTONIX) 40 MG tablet Take 1 tablet (40 mg total) by mouth daily. 30 tablet 11  . potassium chloride SA (K-DUR,KLOR-CON) 20 MEQ tablet Take 1 tablet (20 mEq total) by mouth 3 (three) times daily. 90 tablet 5  . prasugrel (EFFIENT) 10 MG TABS tablet Take 1 tablet (10 mg total) by mouth daily with supper. 30 tablet 6  . Probiotic Product (PROBIOTIC DAILY) CAPS Take 1 by mouth daily 30 capsule 11  . repaglinide (PRANDIN) 2 MG tablet TAKE 2 TABLETS BY MOUTH BEFORE BREAKFAST, 1 TABLET BY MOUTH BEFORE LUNCH AND 2 TABLETS BY MOUTH BEFORE SUPPER 150 tablet 3  . rilpivirine (EDURANT) 25 MG TABS tablet Take 1 tablet (25 mg total) by mouth daily with breakfast. 30 tablet 11  . sitaGLIPtin (JANUVIA) 50 MG tablet Take 1 tablet (50 mg total) by mouth daily. 30 tablet 3  . sodium bicarbonate 650 MG tablet Take 650 mg by mouth 3 (three) times daily.    . valACYclovir (VALTREX) 500 MG tablet Take 1 tablet (500 mg total) by mouth  daily. 30 tablet 5   No current facility-administered medications for this visit.     Past Medical History  Diagnosis Date  . Arthritis   . Diabetes mellitus   . History of depression   . GERD (gastroesophageal reflux disease)   . CAD (coronary artery disease)     2003- cabg  . Hypertension   . Hyperlipidemia   . History of kidney stones   . COPD (chronic obstructive pulmonary disease) ([place])   . HIV (human immunodeficiency virus infection) ([place]) 1991    on meds since initial dx.   . Dermatitis 11/27/2012  . Nocturia 03/06/2013  . Epicondylitis 06/05/2013    right  . Pancreatitis 11/2013     attributed to HIV meds.   . Constipation 05/28/2014  . Pain in joint, ankle and foot 01/19/2015  . Chronic kidney disease     Past Surgical History  Procedure Laterality Date  . Coronary artery bypass graft  05/2002  . Vasectomy    . Cardiac catheterization N/A 07/29/2015    Procedure: Left Heart Cath and Coronary Angiography;  Surgeon: [name], MD;  Location: [facility];  Service: Cardiovascular;  Laterality: N/A;  . Cardiac catheterization N/A 07/29/2015    Procedure: Coronary Stent Intervention;  Surgeon: [name], MD;  Location: [facility];  Service: Cardiovascular;  Laterality: N/A;    Social History   Social History  . Marital Status: Divorced    Spouse Name: N/A  . Number of Children: 4  . Years of Education: N/A   Occupational History  . 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    Social History Main Topics  . Smoking status: Never Smoker   . Smokeless tobacco: Never Used  . Alcohol Use: No  . Drug Use: No  . Sexual Activity: Not Currently     Comment: declined condoms   Other Topics Concern  . Not on file   Social History Narrative   Lives alone.  Supportive friends and family.  His HIV Dx is not a secret.     Family History  Problem Relation Age of Onset  . Arthritis      mother/father/paternal grandparents  . Breast cancer Maternal Aunt     paternal aunt  . Lung cancer Maternal Aunt   . Hyperlipidemia Mother   . Hyperlipidemia Father   . Heart disease      parents/maternal grandparents/ 2 brothers  . Stroke Paternal Grandmother   . Hypertension Father     paternal grandmother/3 brothers/1 sister  . Mental retardation Sister   . Diabetes Mother     paternal grandparents/1 brother    ROS: no fevers or chills, productive cough, hemoptysis, dysphasia, odynophagia, melena, hematochezia, dysuria, hematuria, rash, seizure activity, orthopnea, PND, pedal edema, claudication. Remaining  systems are negative.  Physical Exam: Well-developed well-nourished in no acute distress.  Skin is warm and dry.  HEENT is normal.  Neck is supple.  Chest is clear to auscultation with normal expansion.  Cardiovascular exam is regular rate and rhythm. 2/6 systolic murmur left sternal border. Abdominal exam nontender or distended. No masses palpated. Extremities show no edema. neuro grossly intact

## 2015-09-12 ENCOUNTER — Encounter (HOSPITAL_COMMUNITY)
Admission: RE | Admit: 2015-09-12 | Discharge: 2015-09-12 | Disposition: A | Discharge status: Home / Self Care | Payer: Medicare Other | Source: Ambulatory Visit | Attending: Cardiology | Admitting: Cardiology

## 2015-09-12 DIAGNOSIS — I2119 ST elevation (STEMI) myocardial infarction involving other coronary artery of inferior wall: Secondary | ICD-10-CM | POA: Diagnosis not present

## 2015-09-12 NOTE — Progress Notes (Signed)
Reviewed home exercise with pt today.  Pt plans to continue to walk at home for exercise.  He is also planning to join [employer].  Reviewed THR, pulse, RPE, sign and symptoms, NTG use, and when to call 911 or MD.  Pt voiced understanding. Alberteen Sam, MA, ACSM RCEP

## 2015-09-13 ENCOUNTER — Encounter: Payer: Self-pay | Admitting: Family Medicine

## 2015-09-13 ENCOUNTER — Ambulatory Visit (INDEPENDENT_AMBULATORY_CARE_PROVIDER_SITE_OTHER): Payer: Medicare Other | Admitting: Family Medicine

## 2015-09-13 VITALS — BP 110/66 | HR 81 | Temp 97.9°F | Ht 63.0 in | Wt 129.2 lb

## 2015-09-13 DIAGNOSIS — I2510 Atherosclerotic heart disease of native coronary artery without angina pectoris: Secondary | ICD-10-CM | POA: Diagnosis not present

## 2015-09-13 DIAGNOSIS — E11319 Type 2 diabetes mellitus with unspecified diabetic retinopathy without macular edema: Secondary | ICD-10-CM

## 2015-09-13 DIAGNOSIS — Z951 Presence of aortocoronary bypass graft: Secondary | ICD-10-CM | POA: Diagnosis not present

## 2015-09-13 DIAGNOSIS — D649 Anemia, unspecified: Secondary | ICD-10-CM | POA: Diagnosis not present

## 2015-09-13 DIAGNOSIS — I10 Essential (primary) hypertension: Secondary | ICD-10-CM

## 2015-09-13 DIAGNOSIS — K5900 Constipation, unspecified: Secondary | ICD-10-CM

## 2015-09-13 NOTE — Progress Notes (Signed)
Subjective:    Patient ID: [name], male    DOB: [birth year], 70 y.o.   MRN: [id number]  Chief Complaint  Patient presents with  . Follow-up    6 week    HPI Patient is in today for follow up. He is feeling well today. No further hospitalizations. No acute concerns. Is doing well at home and tolerating cardiac rehab. He feels that is helping him increase his strength. Denies CP/palp/SOB/HA/congestion/fevers/GI or GU c/o. Taking meds as prescribed  Past Medical History  Diagnosis Date  . Arthritis   . Diabetes mellitus   . History of depression   . GERD (gastroesophageal reflux disease)   . CAD (coronary artery disease)     2003- cabg  . Hypertension   . Hyperlipidemia   . History of kidney stones   . COPD (chronic obstructive pulmonary disease) ([place])   . HIV (human immunodeficiency virus infection) ([place]) 1991    on meds since initial dx.   . Dermatitis 11/27/2012  . Nocturia 03/06/2013  . Epicondylitis 06/05/2013    right  . Pancreatitis 11/2013    attributed to HIV meds.   . Constipation 05/28/2014  . Pain in joint, ankle and foot 01/19/2015  . Chronic kidney disease     Past Surgical History  Procedure Laterality Date  . Coronary artery bypass graft  05/2002  . Vasectomy    . Cardiac catheterization N/A 07/29/2015    Procedure: Left Heart Cath and Coronary Angiography;  Surgeon: [name], MD;  Location: [facility];  Service: Cardiovascular;  Laterality: N/A;  . Cardiac catheterization N/A 07/29/2015    Procedure: Coronary Stent Intervention;  Surgeon: [name], MD;  Location: [facility];  Service: Cardiovascular;  Laterality: N/A;    Family History  Problem Relation Age of Onset  . Arthritis      mother/father/paternal grandparents  . Breast cancer Maternal Aunt     paternal aunt  . Lung cancer Maternal Aunt   . Hyperlipidemia Mother   . Hyperlipidemia Father   . Heart disease      parents/maternal grandparents/ 2 brothers  .  Stroke Paternal Grandmother   . Hypertension Father     paternal grandmother/3 brothers/1 sister  . Mental retardation Sister   . Diabetes Mother     paternal grandparents/1 brother    Social History   Social History  . Marital Status: Divorced    Spouse Name: N/A  . Number of Children: 4  . Years of Education: N/A   Occupational History  . 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    Social History Main Topics  . Smoking status: Never Smoker   . Smokeless tobacco: Never Used  . Alcohol Use: No  . Drug Use: No  . Sexual Activity: Not Currently     Comment: declined condoms   Other Topics Concern  . Not on file   Social History Narrative   Lives alone.  Supportive friends and family.  His HIV Dx is not a secret.     Outpatient Prescriptions Prior to Visit  Medication Sig Dispense Refill  . amLODipine (NORVASC) 10 MG tablet Take 1 tablet (10 mg total) by mouth at bedtime. 90 tablet 1  . aspirin 81 MG chewable tablet Chew 81 mg by mouth daily.    Marland Kitchen atorvastatin (LIPITOR) 20 MG tablet Take 1 tablet (20 mg total) by mouth daily. 90 tablet 2  .  b complex vitamins tablet Take 1 tablet by mouth daily. 30 tablet 11  . Cholecalciferol 2000 UNITS CAPS Take 1 capsule (2,000 Units total) by mouth daily. 30 each 11  . clotrimazole-betamethasone (LOTRISONE) cream Apply 1 application topically 2 (two) times daily. apply to lesion on chest wall 45 g 1  . Darunavir Ethanolate (PREZISTA) 800 MG tablet Take 1 tablet (800 mg total) by mouth daily. 30 tablet 6  . dolutegravir (TIVICAY) 50 MG tablet Take 1 tablet (50 mg total) by mouth daily. 30 tablet 11  . furosemide (LASIX) 20 MG tablet Take 40 mg by mouth daily.    Astrid Drafts Omega-3 300 MG CAPS Take 1 capsule daily 30 capsule 11  . lisinopril (PRINIVIL,ZESTRIL) 5 MG tablet Take 2.5 mg by mouth every other day.    . metolazone (ZAROXOLYN) 2.5 MG tablet TAKE 1 TABLET BY MOUTH EVERY DAY 20 MINUTES PRIOR  TO LASIX 30 tablet 6  . nitroGLYCERIN (NITROSTAT) 0.4 MG SL tablet Place 1 tablet (0.4 mg total) under the tongue every 5 (five) minutes as needed for chest pain. 25 tablet 12  . NORVIR 100 MG TABS tablet TAKE 1 TABLET BY MOUTH EVERY DAY 30 tablet 6  . pantoprazole (PROTONIX) 40 MG tablet Take 1 tablet (40 mg total) by mouth daily. 30 tablet 11  . potassium chloride SA (K-DUR,KLOR-CON) 20 MEQ tablet Take 1 tablet (20 mEq total) by mouth 3 (three) times daily. 90 tablet 5  . prasugrel (EFFIENT) 10 MG TABS tablet Take 1 tablet (10 mg total) by mouth daily with supper. 30 tablet 6  . Probiotic Product (PROBIOTIC DAILY) CAPS Take 1 by mouth daily 30 capsule 11  . repaglinide (PRANDIN) 2 MG tablet TAKE 2 TABLETS BY MOUTH BEFORE BREAKFAST, 1 TABLET BY MOUTH BEFORE LUNCH AND 2 TABLETS BY MOUTH BEFORE SUPPER 150 tablet 3  . rilpivirine (EDURANT) 25 MG TABS tablet Take 1 tablet (25 mg total) by mouth daily with breakfast. 30 tablet 11  . sitaGLIPtin (JANUVIA) 50 MG tablet Take 1 tablet (50 mg total) by mouth daily. 30 tablet 3  . sodium bicarbonate 650 MG tablet Take 650 mg by mouth 3 (three) times daily.    . valACYclovir (VALTREX) 500 MG tablet Take 1 tablet (500 mg total) by mouth daily. 30 tablet 5   No facility-administered medications prior to visit.    No Known Allergies  Review of Systems  Constitutional: Positive for malaise/fatigue. Negative for fever.  HENT: Negative for congestion.   Eyes: Negative for discharge.  Respiratory: Negative for shortness of breath.   Cardiovascular: Negative for chest pain, palpitations and leg swelling.  Gastrointestinal: Negative for nausea and abdominal pain.  Genitourinary: Negative for dysuria.  Musculoskeletal: Negative for falls.  Skin: Negative for rash.  Neurological: Negative for loss of consciousness and headaches.  Endo/Heme/Allergies: Negative for environmental allergies.  Psychiatric/Behavioral: Negative for depression. The patient is not  nervous/anxious.        Objective:    Physical Exam  Constitutional: He is oriented to person, place, and time. He appears well-developed and well-nourished. No distress.  HENT:  Head: Normocephalic and atraumatic.  Nose: Nose normal.  Eyes: Right eye exhibits no discharge. Left eye exhibits no discharge.  Neck: Normal range of motion. Neck supple.  Cardiovascular: Normal rate and regular rhythm.   Pulmonary/Chest: Effort normal and breath sounds normal.  Abdominal: Soft. Bowel sounds are normal. There is no tenderness.  Musculoskeletal: He exhibits no edema.  Neurological: He is alert and  oriented to person, place, and time.  Skin: Skin is warm and dry.  Psychiatric: He has a normal mood and affect.  Nursing note and vitals reviewed.   BP 110/66 mmHg  Pulse 81  Temp(Src) 97.9 F (36.6 C) (Oral)  Ht 5\' 3"  (1.6 m)  Wt 129 lb 4 oz (58.627 kg)  BMI 22.90 kg/m2  SpO2 97% Wt Readings from Last 3 Encounters:  09/17/15 131 lb (59.421 kg)  09/13/15 129 lb 4 oz (58.627 kg)  08/30/15 125 lb 14.1 oz (57.1 kg)     Lab Results  Component Value Date   WBC 6.2 09/13/2015   HGB 11.3* 09/13/2015   HCT 34.3* 09/13/2015   PLT 252.0 09/13/2015   GLUCOSE 193* 09/13/2015   CHOL 117* 08/10/2015   TRIG 160* 08/10/2015   HDL 30* 08/10/2015   LDLDIRECT 72.0 04/27/2015   LDLCALC 55 08/10/2015   ALT 21 09/13/2015   AST 17 09/13/2015   NA 136 09/13/2015   K 3.7 09/13/2015   CL 97 09/13/2015   CREATININE 1.61* 09/13/2015   BUN 44* 09/13/2015   CO2 31 09/13/2015   TSH 2.16 04/27/2015   PSA 0.51 03/02/2013   INR 0.99 07/29/2015   HGBA1C 6.4 07/23/2015   MICROALBUR 5.7* 01/19/2015    Lab Results  Component Value Date   TSH 2.16 04/27/2015   Lab Results  Component Value Date   WBC 6.2 09/13/2015   HGB 11.3* 09/13/2015   HCT 34.3* 09/13/2015   MCV 96.6 09/13/2015   PLT 252.0 09/13/2015   Lab Results  Component Value Date   NA 136 09/13/2015   K 3.7 09/13/2015   CO2 31  09/13/2015   GLUCOSE 193* 09/13/2015   BUN 44* 09/13/2015   CREATININE 1.61* 09/13/2015   BILITOT 0.4 09/13/2015   ALKPHOS 71 09/13/2015   AST 17 09/13/2015   ALT 21 09/13/2015   PROT 7.4 09/13/2015   ALBUMIN 4.2 09/13/2015   CALCIUM 9.7 09/13/2015   ANIONGAP 10 07/31/2015   GFR 45.68* 09/13/2015   Lab Results  Component Value Date   CHOL 117* 08/10/2015   Lab Results  Component Value Date   HDL 30* 08/10/2015   Lab Results  Component Value Date   LDLCALC 55 08/10/2015   Lab Results  Component Value Date   TRIG 160* 08/10/2015   Lab Results  Component Value Date   CHOLHDL 3.9 08/10/2015   Lab Results  Component Value Date   HGBA1C 6.4 07/23/2015       Assessment & Plan:   Problem List Items Addressed This Visit    Absolute anemia   Relevant Orders   CBC (Completed)   Comprehensive metabolic panel (Completed)   Constipation    Encouraged increased hydration and fiber in diet. Daily probiotics. If bowels not moving can use MOM 2 tbls po in 4 oz of warm prune juice by mouth every 2-3 days. If no results then repeat in 4 hours with  Dulcolax suppository pr, may repeat again in 4 more hours as needed. Seek care if symptoms worsen. Consider daily Miralax and/or Dulcolax if symptoms persist.       CORONARY ARTERY BYPASS GRAFT, HX OF    No recurrent symptoms since returning home after PCI in December 2016. Has follow up appt with cardiology soon.       DM (diabetes mellitus), type 2 with ophthalmic complications (HCC)    [id number] acceptable, minimize simple carbs. Increase exercise as tolerated. Continue current meds  Essential hypertension - Primary    Well controlled, no changes to meds. Encouraged heart healthy diet such as the DASH diet and exercise as tolerated.       Relevant Orders   CBC (Completed)   Comprehensive metabolic panel (Completed)      I am having [name] maintain his aspirin, sodium bicarbonate, rilpivirine, dolutegravir,  clotrimazole-betamethasone, NORVIR, metolazone, Darunavir Ethanolate, amLODipine, sitaGLIPtin, b complex vitamins, Cholecalciferol, Krill Oil Omega-3, PROBIOTIC DAILY, nitroGLYCERIN, pantoprazole, prasugrel, furosemide, lisinopril, repaglinide, potassium chloride SA, valACYclovir, and atorvastatin.  No orders of the defined types were placed in this encounter.     Penni Homans, MD

## 2015-09-13 NOTE — Patient Instructions (Signed)
Hypertension Hypertension, commonly called high blood pressure, is when the force of blood pumping through your arteries is too strong. Your arteries are the blood vessels that carry blood from your heart throughout your body. A blood pressure reading consists of a higher number over a lower number, such as 110/72. The higher number (systolic) is the pressure inside your arteries when your heart pumps. The lower number (diastolic) is the pressure inside your arteries when your heart relaxes. Ideally you want your blood pressure below 120/80. Hypertension forces your heart to work harder to pump blood. Your arteries may become narrow or stiff. Having untreated or uncontrolled hypertension can cause heart attack, stroke, kidney disease, and other problems. RISK FACTORS Some risk factors for high blood pressure are controllable. Others are not.  Risk factors you cannot control include:   Race. You may be at higher risk if you are African American.  Age. Risk increases with age.  Gender. Men are at higher risk than women before age 45 years. After age 65, women are at higher risk than men. Risk factors you can control include:  Not getting enough exercise or physical activity.  Being overweight.  Getting too much fat, sugar, calories, or salt in your diet.  Drinking too much alcohol. SIGNS AND SYMPTOMS Hypertension does not usually cause signs or symptoms. Extremely high blood pressure (hypertensive crisis) may cause headache, anxiety, shortness of breath, and nosebleed. DIAGNOSIS To check if you have hypertension, your health care provider will measure your blood pressure while you are seated, with your arm held at the level of your heart. It should be measured at least twice using the same arm. Certain conditions can cause a difference in blood pressure between your right and left arms. A blood pressure reading that is higher than normal on one occasion does not mean that you need treatment. If  it is not clear whether you have high blood pressure, you may be asked to return on a different day to have your blood pressure checked again. Or, you may be asked to monitor your blood pressure at home for 1 or more weeks. TREATMENT Treating high blood pressure includes making lifestyle changes and possibly taking medicine. Living a healthy lifestyle can help lower high blood pressure. You may need to change some of your habits. Lifestyle changes may include:  Following the DASH diet. This diet is high in fruits, vegetables, and whole grains. It is low in salt, red meat, and added sugars.  Keep your sodium intake below 2,300 mg per day.  Getting at least 30-45 minutes of aerobic exercise at least 4 times per week.  Losing weight if necessary.  Not smoking.  Limiting alcoholic beverages.  Learning ways to reduce stress. Your health care provider may prescribe medicine if lifestyle changes are not enough to get your blood pressure under control, and if one of the following is true:  You are 18-59 years of age and your systolic blood pressure is above 140.  You are 60 years of age or older, and your systolic blood pressure is above 150.  Your diastolic blood pressure is above 90.  You have diabetes, and your systolic blood pressure is over 140 or your diastolic blood pressure is over 90.  You have kidney disease and your blood pressure is above 140/90.  You have heart disease and your blood pressure is above 140/90. Your personal target blood pressure may vary depending on your medical conditions, your age, and other factors. HOME CARE INSTRUCTIONS    Have your blood pressure rechecked as directed by your health care provider.   Take medicines only as directed by your health care provider. Follow the directions carefully. Blood pressure medicines must be taken as prescribed. The medicine does not work as well when you skip doses. Skipping doses also puts you at risk for  problems.  Do not smoke.   Monitor your blood pressure at home as directed by your health care provider. SEEK MEDICAL CARE IF:   You think you are having a reaction to medicines taken.  You have recurrent headaches or feel dizzy.  You have swelling in your ankles.  You have trouble with your vision. SEEK IMMEDIATE MEDICAL CARE IF:  You develop a severe headache or confusion.  You have unusual weakness, numbness, or feel faint.  You have severe chest or abdominal pain.  You vomit repeatedly.  You have trouble breathing. MAKE SURE YOU:   Understand these instructions.  Will watch your condition.  Will get help right away if you are not doing well or get worse.   This information is not intended to replace advice given to you by your health care provider. Make sure you discuss any questions you have with your health care provider.   Document Released: 07/21/2005 Document Revised: 12/05/2014 Document Reviewed: 05/13/2013 Elsevier Interactive Patient Education 2016 Elsevier Inc.  

## 2015-09-13 NOTE — Progress Notes (Signed)
Pre visit review using our clinic review tool, if applicable. No additional management support is needed unless otherwise documented below in the visit note. 

## 2015-09-14 ENCOUNTER — Encounter (HOSPITAL_COMMUNITY)
Admission: RE | Admit: 2015-09-14 | Discharge: 2015-09-14 | Disposition: A | Discharge status: Home / Self Care | Payer: Medicare Other | Source: Ambulatory Visit | Attending: Cardiology | Admitting: Cardiology

## 2015-09-14 DIAGNOSIS — I2119 ST elevation (STEMI) myocardial infarction involving other coronary artery of inferior wall: Secondary | ICD-10-CM | POA: Diagnosis not present

## 2015-09-14 LAB — COMPREHENSIVE METABOLIC PANEL
ALT: 21 U/L (ref 0–53)
AST: 17 U/L (ref 0–37)
Albumin: 4.2 g/dL (ref 3.5–5.2)
Alkaline Phosphatase: 71 U/L (ref 39–117)
BILIRUBIN TOTAL: 0.4 mg/dL (ref 0.2–1.2)
BUN: 44 mg/dL — ABNORMAL HIGH (ref 6–23)
CO2: 31 meq/L (ref 19–32)
Calcium: 9.7 mg/dL (ref 8.4–10.5)
Chloride: 97 mEq/L (ref 96–112)
Creatinine, Ser: 1.61 mg/dL — ABNORMAL HIGH (ref 0.40–1.50)
GFR: 45.68 mL/min — AB (ref >60.00)
GLUCOSE: 193 mg/dL — AB (ref 70–99)
POTASSIUM: 3.7 meq/L (ref 3.5–5.1)
SODIUM: 136 meq/L (ref 135–145)
Total Protein: 7.4 g/dL (ref 6.0–8.3)

## 2015-09-15 LAB — CBC
HCT: 34.3 % — ABNORMAL LOW (ref 39.0–52.0)
HEMOGLOBIN: 11.3 g/dL — AB (ref 13.0–17.0)
MCHC: 32.9 g/dL (ref 30.0–36.0)
MCV: 96.6 fl (ref 78.0–100.0)
Platelets: 252 10*3/uL (ref 150.0–400.0)
RBC: 3.55 Mil/uL — ABNORMAL LOW (ref 4.22–5.81)
RDW: 14.7 % (ref 11.5–15.5)
WBC: 6.2 10*3/uL (ref 4.0–10.5)

## 2015-09-17 ENCOUNTER — Encounter: Payer: Self-pay | Admitting: Cardiology

## 2015-09-17 ENCOUNTER — Ambulatory Visit (INDEPENDENT_AMBULATORY_CARE_PROVIDER_SITE_OTHER): Payer: Medicare Other | Admitting: Cardiology

## 2015-09-17 ENCOUNTER — Encounter (HOSPITAL_COMMUNITY)
Admission: RE | Admit: 2015-09-17 | Discharge: 2015-09-17 | Disposition: A | Discharge status: Home / Self Care | Payer: Medicare Other | Source: Ambulatory Visit | Attending: Cardiology | Admitting: Cardiology

## 2015-09-17 VITALS — BP 112/62 | HR 88 | Ht 63.0 in | Wt 131.0 lb

## 2015-09-17 DIAGNOSIS — E785 Hyperlipidemia, unspecified: Secondary | ICD-10-CM

## 2015-09-17 DIAGNOSIS — I2510 Atherosclerotic heart disease of native coronary artery without angina pectoris: Secondary | ICD-10-CM | POA: Diagnosis not present

## 2015-09-17 DIAGNOSIS — I350 Nonrheumatic aortic (valve) stenosis: Secondary | ICD-10-CM | POA: Insufficient documentation

## 2015-09-17 DIAGNOSIS — I10 Essential (primary) hypertension: Secondary | ICD-10-CM | POA: Diagnosis not present

## 2015-09-17 DIAGNOSIS — I2119 ST elevation (STEMI) myocardial infarction involving other coronary artery of inferior wall: Secondary | ICD-10-CM | POA: Diagnosis not present

## 2015-09-17 HISTORY — DX: Nonrheumatic aortic (valve) stenosis: I35.0

## 2015-09-17 NOTE — Assessment & Plan Note (Signed)
Continue statin. 

## 2015-09-17 NOTE — Patient Instructions (Signed)
Your physician wants you to follow-up in: 6 MONTHS WITH DR CRENSHAW You will receive a reminder letter in the mail two months in advance. If you don't receive a letter, please call our office to schedule the follow-up appointment.   If you need a refill on your cardiac medications before your next appointment, please call your pharmacy.  

## 2015-09-17 NOTE — Assessment & Plan Note (Signed)
Blood pressure controlled. Continue present medications. 

## 2015-09-17 NOTE — Assessment & Plan Note (Signed)
Continue aspirin, prasugrel and statin.

## 2015-09-17 NOTE — Assessment & Plan Note (Signed)
Patient will need follow-up echoes in the future. 

## 2015-09-17 NOTE — Progress Notes (Signed)
[name] 70 y.o. male Nutrition Note Spoke with pt. Nutrition Plan and Nutrition Survey goals reviewed with pt. Pt is following Step 1 of the Therapeutic Lifestyle Changes diet. Pt is diabetic. Last A1c indicates blood glucose well-controlled. This Probation officer went over Diabetes Education test results. Pt checks CBG's 1-4 times daily. Fasting CBG's reportedly 120-140 mg/dL. Pt expressed understanding of the information reviewed. Pt aware of nutrition education classes offered and plans on attending nutrition classes.  Lab Results  Component Value Date   HGBA1C 6.4 07/23/2015   Wt Readings from Last 3 Encounters:  09/13/15 129 lb 4 oz (58.627 kg)  08/30/15 125 lb 14.1 oz (57.1 kg)  08/17/15 124 lb 4 oz (56.359 kg)   Nutrition Diagnosis ? Food-and nutrition-related knowledge deficit related to lack of exposure to information as related to diagnosis of: ? CVD ? DM  Nutrition RX/ Estimated Daily Nutrition Needs for: wt  maintenance  1900-2100 Kcal, 60-70 gm fat, 12-14 gm sat fat, 1.8-2.1 gm trans-fat, <1500 mg sodium, 250 gm CHO   Nutrition Intervention ? Pt's individual nutrition plan reviewed with pt. ? Benefits of adopting Therapeutic Lifestyle Changes discussed when Medficts reviewed. ? Pt to attend the Portion Distortion class ? Pt to attend the Diabetes Q & A class ? Pt to attend the   ? Nutrition I class - met; 09/11/15                 ? Nutrition II class     ? Diabetes Blitz class ? Continue client-centered nutrition education by RD, as part of interdisciplinary care. Goal(s) ? Pt to identify and limit food sources of saturated fat, trans fat, and sodium ? Pt to describe the benefit of including fruits, vegetables, whole grains, and low-fat dairy products in a heart healthy meal plan. ? Use pre-meal and post-meal CBG's and A1c to determine whether adjustments in food/meal planning will be beneficial or if any meds need to be combined with nutrition therapy. Monitor and Evaluate  progress toward nutrition goal with team. Derek Mound, M.Ed, RD, LDN, CDE 09/17/2015 11:19 AM

## 2015-09-19 ENCOUNTER — Encounter (HOSPITAL_COMMUNITY)
Admission: RE | Admit: 2015-09-19 | Discharge: 2015-09-19 | Disposition: A | Discharge status: Home / Self Care | Payer: Medicare Other | Source: Ambulatory Visit | Attending: Cardiology | Admitting: Cardiology

## 2015-09-19 DIAGNOSIS — I2119 ST elevation (STEMI) myocardial infarction involving other coronary artery of inferior wall: Secondary | ICD-10-CM | POA: Diagnosis not present

## 2015-09-21 ENCOUNTER — Encounter (HOSPITAL_COMMUNITY)
Admission: RE | Admit: 2015-09-21 | Discharge: 2015-09-21 | Disposition: A | Discharge status: Home / Self Care | Payer: Medicare Other | Source: Ambulatory Visit | Attending: Cardiology | Admitting: Cardiology

## 2015-09-21 ENCOUNTER — Encounter: Payer: Self-pay | Admitting: Cardiology

## 2015-09-21 ENCOUNTER — Other Ambulatory Visit: Payer: Self-pay | Admitting: Cardiology

## 2015-09-21 DIAGNOSIS — I2119 ST elevation (STEMI) myocardial infarction involving other coronary artery of inferior wall: Secondary | ICD-10-CM | POA: Diagnosis not present

## 2015-09-23 ENCOUNTER — Encounter: Payer: Self-pay | Admitting: Family Medicine

## 2015-09-23 NOTE — Assessment & Plan Note (Signed)
Well controlled, no changes to meds. Encouraged heart healthy diet such as the DASH diet and exercise as tolerated.  °

## 2015-09-23 NOTE — Assessment & Plan Note (Signed)
hgba1c acceptable, minimize simple carbs. Increase exercise as tolerated. Continue current meds 

## 2015-09-23 NOTE — Assessment & Plan Note (Signed)
No recurrent symptoms since returning home after PCI in December 2016. Has follow up appt with cardiology soon.

## 2015-09-23 NOTE — Assessment & Plan Note (Signed)
Encouraged increased hydration and fiber in diet. Daily probiotics. If bowels not moving can use MOM 2 tbls po in 4 oz of warm prune juice by mouth every 2-3 days. If no results then repeat in 4 hours with  Dulcolax suppository pr, may repeat again in 4 more hours as needed. Seek care if symptoms worsen. Consider daily Miralax and/or Dulcolax if symptoms persist.  

## 2015-09-24 ENCOUNTER — Encounter (HOSPITAL_COMMUNITY)
Admission: RE | Admit: 2015-09-24 | Discharge: 2015-09-24 | Disposition: A | Discharge status: Home / Self Care | Payer: Medicare Other | Source: Ambulatory Visit | Attending: Cardiology | Admitting: Cardiology

## 2015-09-24 DIAGNOSIS — I2119 ST elevation (STEMI) myocardial infarction involving other coronary artery of inferior wall: Secondary | ICD-10-CM | POA: Diagnosis not present

## 2015-09-25 ENCOUNTER — Ambulatory Visit: Payer: Self-pay

## 2015-09-26 ENCOUNTER — Encounter (HOSPITAL_COMMUNITY)
Admission: RE | Admit: 2015-09-26 | Discharge: 2015-09-26 | Disposition: A | Discharge status: Home / Self Care | Payer: Medicare Other | Source: Ambulatory Visit | Attending: Cardiology | Admitting: Cardiology

## 2015-09-26 ENCOUNTER — Encounter: Payer: Self-pay | Admitting: Cardiology

## 2015-09-26 DIAGNOSIS — I2119 ST elevation (STEMI) myocardial infarction involving other coronary artery of inferior wall: Secondary | ICD-10-CM | POA: Diagnosis not present

## 2015-09-28 ENCOUNTER — Encounter (HOSPITAL_COMMUNITY)
Admission: RE | Admit: 2015-09-28 | Discharge: 2015-09-28 | Disposition: A | Discharge status: Home / Self Care | Payer: Medicare Other | Source: Ambulatory Visit | Attending: Cardiology | Admitting: Cardiology

## 2015-09-28 DIAGNOSIS — I2119 ST elevation (STEMI) myocardial infarction involving other coronary artery of inferior wall: Secondary | ICD-10-CM | POA: Diagnosis not present

## 2015-10-01 ENCOUNTER — Encounter (HOSPITAL_COMMUNITY)
Admission: RE | Admit: 2015-10-01 | Discharge: 2015-10-01 | Disposition: A | Discharge status: Home / Self Care | Payer: Medicare Other | Source: Ambulatory Visit | Attending: Cardiology | Admitting: Cardiology

## 2015-10-01 DIAGNOSIS — I2119 ST elevation (STEMI) myocardial infarction involving other coronary artery of inferior wall: Secondary | ICD-10-CM | POA: Diagnosis not present

## 2015-10-03 ENCOUNTER — Encounter (HOSPITAL_COMMUNITY)
Admission: RE | Admit: 2015-10-03 | Discharge: 2015-10-03 | Disposition: A | Discharge status: Home / Self Care | Payer: Medicare Other | Source: Ambulatory Visit | Attending: Cardiology | Admitting: Cardiology

## 2015-10-03 DIAGNOSIS — I2119 ST elevation (STEMI) myocardial infarction involving other coronary artery of inferior wall: Secondary | ICD-10-CM | POA: Insufficient documentation

## 2015-10-05 ENCOUNTER — Encounter (HOSPITAL_COMMUNITY)
Admission: RE | Admit: 2015-10-05 | Discharge: 2015-10-05 | Disposition: A | Discharge status: Home / Self Care | Payer: Medicare Other | Source: Ambulatory Visit | Attending: Cardiology | Admitting: Cardiology

## 2015-10-05 DIAGNOSIS — I2119 ST elevation (STEMI) myocardial infarction involving other coronary artery of inferior wall: Secondary | ICD-10-CM | POA: Diagnosis not present

## 2015-10-07 ENCOUNTER — Encounter: Payer: Self-pay | Admitting: Family Medicine

## 2015-10-08 ENCOUNTER — Encounter (HOSPITAL_COMMUNITY)
Admission: RE | Admit: 2015-10-08 | Discharge: 2015-10-08 | Disposition: A | Discharge status: Home / Self Care | Payer: Medicare Other | Source: Ambulatory Visit | Attending: Cardiology | Admitting: Cardiology

## 2015-10-08 ENCOUNTER — Other Ambulatory Visit: Payer: Self-pay | Admitting: Family Medicine

## 2015-10-08 DIAGNOSIS — R21 Rash and other nonspecific skin eruption: Secondary | ICD-10-CM

## 2015-10-08 DIAGNOSIS — I2119 ST elevation (STEMI) myocardial infarction involving other coronary artery of inferior wall: Secondary | ICD-10-CM | POA: Diagnosis not present

## 2015-10-09 ENCOUNTER — Encounter: Payer: Self-pay | Admitting: Internal Medicine

## 2015-10-10 ENCOUNTER — Telehealth: Payer: Self-pay | Admitting: Family Medicine

## 2015-10-10 ENCOUNTER — Encounter (HOSPITAL_COMMUNITY)
Admission: RE | Admit: 2015-10-10 | Discharge: 2015-10-10 | Disposition: A | Discharge status: Home / Self Care | Payer: Medicare Other | Source: Ambulatory Visit | Attending: Cardiology | Admitting: Cardiology

## 2015-10-10 DIAGNOSIS — I2119 ST elevation (STEMI) myocardial infarction involving other coronary artery of inferior wall: Secondary | ICD-10-CM | POA: Diagnosis not present

## 2015-10-10 DIAGNOSIS — L300 Nummular dermatitis: Secondary | ICD-10-CM | POA: Diagnosis not present

## 2015-10-10 NOTE — Telephone Encounter (Signed)
Pt called in to request his current medication list be sent to a different provider. Faxed to # [phone] per his request.

## 2015-10-12 ENCOUNTER — Ambulatory Visit (INDEPENDENT_AMBULATORY_CARE_PROVIDER_SITE_OTHER): Payer: Medicare Other

## 2015-10-12 ENCOUNTER — Encounter (HOSPITAL_COMMUNITY)
Admission: RE | Admit: 2015-10-12 | Discharge: 2015-10-12 | Disposition: A | Discharge status: Home / Self Care | Payer: Medicare Other | Source: Ambulatory Visit | Attending: Cardiology | Admitting: Cardiology

## 2015-10-12 VITALS — BP 100/50 | HR 81 | Ht 64.0 in | Wt 132.8 lb

## 2015-10-12 DIAGNOSIS — Z0000 Encounter for general adult medical examination without abnormal findings: Secondary | ICD-10-CM

## 2015-10-12 DIAGNOSIS — I2119 ST elevation (STEMI) myocardial infarction involving other coronary artery of inferior wall: Secondary | ICD-10-CM | POA: Diagnosis not present

## 2015-10-12 NOTE — Patient Instructions (Addendum)
Will call your eye doctor for a copy of your records.    Remember to schedule your colonoscopy next year.    Continue doing brain stimulating activities (puzzles, reading, adult coloring books, staying active) to keep memory sharp.   Continue to eat heart healthy diet (full of fruits, vegetables, whole grains, lean protein, water--limit salt, fat, and sugar intake) and increase physical activity as tolerated.  Follow up with Dr. Charlett Blake as scheduled in May.    Health Maintenance, Male A healthy lifestyle and preventative care can promote health and wellness.  Maintain regular health, dental, and eye exams.  Eat a healthy diet. Foods like vegetables, fruits, whole grains, low-fat dairy products, and lean protein foods contain the nutrients you need and are low in calories. Decrease your intake of foods high in solid fats, added sugars, and salt. Get information about a proper diet from your health care provider, if necessary.  Regular physical exercise is one of the most important things you can do for your health. Most adults should get at least 150 minutes of moderate-intensity exercise (any activity that increases your heart rate and causes you to sweat) each week. In addition, most adults need muscle-strengthening exercises on 2 or more days a week.   Maintain a healthy weight. The body mass index (BMI) is a screening tool to identify possible weight problems. It provides an estimate of body fat based on height and weight. Your health care provider can find your BMI and can help you achieve or maintain a healthy weight. For males 20 years and older:  A BMI below 18.5 is considered underweight.  A BMI of 18.5 to 24.9 is normal.  A BMI of 25 to 29.9 is considered overweight.  A BMI of 30 and above is considered obese.  Maintain normal blood lipids and cholesterol by exercising and minimizing your intake of saturated fat. Eat a balanced diet with plenty of fruits and vegetables. Blood  tests for lipids and cholesterol should begin at age 55 and be repeated every 5 years. If your lipid or cholesterol levels are high, you are over age 19, or you are at high risk for heart disease, you may need your cholesterol levels checked more frequently.Ongoing high lipid and cholesterol levels should be treated with medicines if diet and exercise are not working.  If you smoke, find out from your health care provider how to quit. If you do not use tobacco, do not start.  Lung cancer screening is recommended for adults aged 67-80 years who are at high risk for developing lung cancer because of a history of smoking. A yearly low-dose CT scan of the lungs is recommended for people who have at least a 30-pack-year history of smoking and are current smokers or have quit within the past 15 years. A pack year of smoking is smoking an average of 1 pack of cigarettes a day for 1 year (for example, a 30-pack-year history of smoking could mean smoking 1 pack a day for 30 years or 2 packs a day for 15 years). Yearly screening should continue until the smoker has stopped smoking for at least 15 years. Yearly screening should be stopped for people who develop a health problem that would prevent them from having lung cancer treatment.  If you choose to drink alcohol, do not have more than 2 drinks per day. One drink is considered to be 12 oz (360 mL) of beer, 5 oz (150 mL) of wine, or 1.5 oz (45 mL) of  liquor.  Avoid the use of street drugs. Do not share needles with anyone. Ask for help if you need support or instructions about stopping the use of drugs.  High blood pressure causes heart disease and increases the risk of stroke. High blood pressure is more likely to develop in:  People who have blood pressure in the end of the normal range (100-139/85-89 mm Hg).  People who are overweight or obese.  People who are African American.  If you are 37-45 years of age, have your blood pressure checked every 3-5  years. If you are 72 years of age or older, have your blood pressure checked every year. You should have your blood pressure measured twice--once when you are at a hospital or clinic, and once when you are not at a hospital or clinic. Record the average of the two measurements. To check your blood pressure when you are not at a hospital or clinic, you can use:  An automated blood pressure machine at a pharmacy.  A home blood pressure monitor.  If you are 60-42 years old, ask your health care provider if you should take aspirin to prevent heart disease.  Diabetes screening involves taking a blood sample to check your fasting blood sugar level. This should be done once every 3 years after age 37 if you are at a normal weight and without risk factors for diabetes. Testing should be considered at a younger age or be carried out more frequently if you are overweight and have at least 1 risk factor for diabetes.  Colorectal cancer can be detected and often prevented. Most routine colorectal cancer screening begins at the age of 29 and continues through age 57. However, your health care provider may recommend screening at an earlier age if you have risk factors for colon cancer. On a yearly basis, your health care provider may provide home test kits to check for hidden blood in the stool. A small camera at the end of a tube may be used to directly examine the colon (sigmoidoscopy or colonoscopy) to detect the earliest forms of colorectal cancer. Talk to your health care provider about this at age 86 when routine screening begins. A direct exam of the colon should be repeated every 5-10 years through age 22, unless early forms of precancerous polyps or small growths are found.  People who are at an increased risk for hepatitis B should be screened for this virus. You are considered at high risk for hepatitis B if:  You were born in a country where hepatitis B occurs often. Talk with your health care provider  about which countries are considered high risk.  Your parents were born in a high-risk country and you have not received a shot to protect against hepatitis B (hepatitis B vaccine).  You have HIV or AIDS.  You use needles to inject street drugs.  You live with, or have sex with, someone who has hepatitis B.  You are a man who has sex with other men (MSM).  You get hemodialysis treatment.  You take certain medicines for conditions like cancer, organ transplantation, and autoimmune conditions.  Hepatitis C blood testing is recommended for all people born from [birth year] through 1963 and any individual with known risk factors for hepatitis C.  Healthy men should no longer receive prostate-specific antigen (PSA) blood tests as part of routine cancer screening. Talk to your health care provider about prostate cancer screening.  Testicular cancer screening is not recommended for adolescents or adult  males who have no symptoms. Screening includes self-exam, a health care provider exam, and other screening tests. Consult with your health care provider about any symptoms you have or any concerns you have about testicular cancer.  Practice safe sex. Use condoms and avoid high-risk sexual practices to reduce the spread of sexually transmitted infections (STIs).  You should be screened for STIs, including gonorrhea and chlamydia if:  You are sexually active and are younger than 24 years.  You are older than 24 years, and your health care provider tells you that you are at risk for this type of infection.  Your sexual activity has changed since you were last screened, and you are at an increased risk for chlamydia or gonorrhea. Ask your health care provider if you are at risk.  If you are at risk of being infected with HIV, it is recommended that you take a prescription medicine daily to prevent HIV infection. This is called pre-exposure prophylaxis (PrEP). You are considered at risk if:  You are a  man who has sex with other men (MSM).  You are a heterosexual man who is sexually active with multiple partners.  You take drugs by injection.  You are sexually active with a partner who has HIV.  Talk with your health care provider about whether you are at high risk of being infected with HIV. If you choose to begin PrEP, you should first be tested for HIV. You should then be tested every 3 months for as long as you are taking PrEP.  Use sunscreen. Apply sunscreen liberally and repeatedly throughout the day. You should seek shade when your shadow is shorter than you. Protect yourself by wearing long sleeves, pants, a wide-brimmed hat, and sunglasses year round whenever you are outdoors.  Tell your health care provider of new moles or changes in moles, especially if there is a change in shape or color. Also, tell your health care provider if a mole is larger than the size of a pencil eraser.  A one-time screening for abdominal aortic aneurysm (AAA) and surgical repair of large AAAs by ultrasound is recommended for men aged 39-75 years who are current or former smokers.  Stay current with your vaccines (immunizations).   This information is not intended to replace advice given to you by your health care provider. Make sure you discuss any questions you have with your health care provider.   Document Released: 01/17/2008 Document Revised: 08/11/2014 Document Reviewed: 12/16/2010 Elsevier Interactive Patient Education 2016 [employer].  Hearing Loss Hearing loss is a partial or total loss of the ability to hear. This can be temporary or permanent, and it can happen in one or both ears. Hearing loss may be referred to as deafness. Medical care is necessary to treat hearing loss properly and to prevent the condition from getting worse. Your hearing may partially or completely come back, depending on what caused your hearing loss and how severe it is. In some cases, hearing loss is  permanent. CAUSES Common causes of hearing loss include:   Too much wax in the ear canal.   Infection of the ear canal or middle ear.   Fluid in the middle ear.   Injury to the ear or surrounding area.   An object stuck in the ear.   Prolonged exposure to loud sounds, such as music.  Less common causes of hearing loss include:   Tumors in the ear.   Viral or bacterial infections, such as meningitis.   A  hole in the eardrum (perforated eardrum).  Problems with the hearing nerve that sends signals between the brain and the ear.  Certain medicines.  SYMPTOMS  Symptoms of this condition may include:  Difficulty telling the difference between sounds.  Difficulty following a conversation when there is background noise.  Lack of response to sounds in your environment. This may be most noticeable when you do not respond to startling sounds.  Needing to turn up the volume on the television, radio, etc.  Ringing in the ears.  Dizziness.  Pain in the ears. DIAGNOSIS This condition is diagnosed based on a physical exam and a hearing test (audiometry). The audiometry test will be performed by a hearing specialist (audiologist). You may also be referred to an ear, nose, and throat (ENT) specialist (otolaryngologist).  TREATMENT Treatment for recent onset of hearing loss may include:   Ear wax removal.   Being prescribed medicines to prevent infection (antibiotics).   Being prescribed medicines to reduce inflammation (corticosteroids).  HOME CARE INSTRUCTIONS  If you were prescribed an antibiotic medicine, take it as told by your health care provider. Do not stop taking the antibiotic even if you start to feel better.  Take over-the-counter and prescription medicines only as told by your health care provider.  Avoid loud noises.   Return to your normal activities as told by your health care provider. Ask your health care provider what activities are safe for  you.  Keep all follow-up visits as told by your health care provider. This is important. SEEK MEDICAL CARE IF:   You feel dizzy.   You develop new symptoms.   You vomit or feel nauseous.   You have a fever.  SEEK IMMEDIATE MEDICAL CARE IF:  You develop sudden changes in your vision.   You have severe ear pain.   You have new or increased weakness.  You have a severe headache.   This information is not intended to replace advice given to you by your health care provider. Make sure you discuss any questions you have with your health care provider.   Document Released: 07/21/2005 Document Revised: 04/11/2015 Document Reviewed: 12/06/2014 Elsevier Interactive Patient Education [employer].

## 2015-10-12 NOTE — Progress Notes (Signed)
Pre visit review using our clinic review tool, if applicable. No additional management support is needed unless otherwise documented below in the visit note. 

## 2015-10-12 NOTE — Progress Notes (Signed)
Subjective:   [name] is a 70 y.o. male who presents for an Initial Medicare Annual Wellness Visit.  Review of Systems: No ROS Cardiac Risk Factors include: advanced age (>47men, >72 women);diabetes mellitus;male gender;hypertension Sleep patterns:  Sleeps at least 4-5 hours per night.  Interrupted sleeps./Wakes up at least 3-4 times per night to void Home Safety/Smoke Alarms:  Feels safe at home.  Lives with alone in one level home.  Smoke alarms present. Firearm Safety:  No firearms.   Seat Belt Safety/Bike Helmet:  Always wears seat belt.    Counseling:   Eye Exam-Last exam:  03/2015; Sees Dr. Dorcas Carrow at [facility], [place] every year.   Male:  [facility]-07/21/12-Dr. Normajean Glasgow in 5 years.     PSA-  03/02/13- 0.51    Objective:    Today's Vitals   10/12/15 1352  BP: 100/50  Pulse: 81  Height: 5\' 4"  (1.626 m)  Weight: 132 lb 12.8 oz (60.238 kg)  SpO2: 97%  PainSc: 0-No pain    Current Medications (verified) Outpatient Encounter Prescriptions as of 10/12/2015  Medication Sig  . amLODipine (NORVASC) 10 MG tablet Take 1 tablet (10 mg total) by mouth at bedtime.  Marland Kitchen aspirin 81 MG chewable tablet Chew 81 mg by mouth daily.  Marland Kitchen atorvastatin (LIPITOR) 20 MG tablet Take 1 tablet (20 mg total) by mouth daily.  Marland Kitchen b complex vitamins tablet Take 1 tablet by mouth daily.  . Cholecalciferol 2000 UNITS CAPS Take 1 capsule (2,000 Units total) by mouth daily.  . clotrimazole-betamethasone (LOTRISONE) cream Apply 1 application topically 2 (two) times daily. apply to lesion on chest wall  . Darunavir Ethanolate (PREZISTA) 800 MG tablet Take 1 tablet (800 mg total) by mouth daily.  . dolutegravir (TIVICAY) 50 MG tablet Take 1 tablet (50 mg total) by mouth daily.  . furosemide (LASIX) 20 MG tablet Take 40 mg by mouth daily.  Astrid Drafts Omega-3 300 MG CAPS Take 1 capsule daily  . lisinopril (PRINIVIL,ZESTRIL) 5 MG tablet Take 2.5 mg by mouth every other day.   . metolazone (ZAROXOLYN) 2.5 MG tablet TAKE 1 TABLET BY MOUTH EVERY DAY 20 MINUTES PRIOR TO LASIX  . nitroGLYCERIN (NITROSTAT) 0.4 MG SL tablet Place 1 tablet (0.4 mg total) under the tongue every 5 (five) minutes as needed for chest pain.  Marland Kitchen NORVIR 100 MG TABS tablet TAKE 1 TABLET BY MOUTH EVERY DAY  . pantoprazole (PROTONIX) 40 MG tablet Take 1 tablet (40 mg total) by mouth daily.  . potassium chloride SA (K-DUR,KLOR-CON) 20 MEQ tablet Take 1 tablet (20 mEq total) by mouth 3 (three) times daily.  . prasugrel (EFFIENT) 10 MG TABS tablet Take 1 tablet (10 mg total) by mouth daily with supper.  . Probiotic Product (PROBIOTIC DAILY) CAPS Take 1 by mouth daily  . repaglinide (PRANDIN) 2 MG tablet TAKE 2 TABLETS BY MOUTH BEFORE BREAKFAST, 1 TABLET BY MOUTH BEFORE LUNCH AND 2 TABLETS BY MOUTH BEFORE SUPPER  . rilpivirine (EDURANT) 25 MG TABS tablet Take 1 tablet (25 mg total) by mouth daily with breakfast.  . sitaGLIPtin (JANUVIA) 50 MG tablet Take 1 tablet (50 mg total) by mouth daily.  . sodium bicarbonate 650 MG tablet Take 650 mg by mouth 3 (three) times daily.  . valACYclovir (VALTREX) 500 MG tablet Take 1 tablet (500 mg total) by mouth daily.   No facility-administered encounter medications on file as of 10/12/2015.    Allergies (verified) Review of patient's allergies indicates  no known allergies.   History: Past Medical History  Diagnosis Date  . Arthritis   . Diabetes mellitus   . History of depression   . GERD (gastroesophageal reflux disease)   . CAD (coronary artery disease)     2003- cabg  . Hypertension   . Hyperlipidemia   . History of kidney stones   . COPD (chronic obstructive pulmonary disease) ([place])   . HIV (human immunodeficiency virus infection) ([place]) 1991    on meds since initial dx.   . Dermatitis 11/27/2012  . Nocturia 03/06/2013  . Epicondylitis 06/05/2013    right  . Pancreatitis 11/2013    attributed to HIV meds.   . Constipation 05/28/2014  . Pain in joint,  ankle and foot 01/19/2015  . Chronic kidney disease    Past Surgical History  Procedure Laterality Date  . Coronary artery bypass graft  05/2002  . Vasectomy    . Cardiac catheterization N/A 07/29/2015    Procedure: Left Heart Cath and Coronary Angiography;  Surgeon: [name], MD;  Location: [facility];  Service: Cardiovascular;  Laterality: N/A;  . Cardiac catheterization N/A 07/29/2015    Procedure: Coronary Stent Intervention;  Surgeon: [name], MD;  Location: [facility];  Service: Cardiovascular;  Laterality: N/A;   Family History  Problem Relation Age of Onset  . Arthritis      mother/father/paternal grandparents  . Breast cancer Maternal Aunt     paternal aunt  . Lung cancer Maternal Aunt   . Hyperlipidemia Mother   . Hyperlipidemia Father   . Heart disease      parents/maternal grandparents/ 2 brothers  . Stroke Paternal Grandmother   . Hypertension Father     paternal grandmother/3 brothers/1 sister  . Mental retardation Sister   . Diabetes Mother     paternal grandparents/1 brother   Social History   Occupational History  . 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    Social History Main Topics  . Smoking status: Never Smoker   . Smokeless tobacco: Never Used  . Alcohol Use: No  . Drug Use: No  . Sexual Activity: Not Currently     Comment: declined condoms   Tobacco Counseling Counseling given: No   Activities of Daily Living In your present state of health, do you have any difficulty performing the following activities: 10/12/2015 08/13/2015  Hearing? Y N  Vision? N N  Difficulty concentrating or making decisions? Y N  Walking or climbing stairs? N N  Dressing or bathing? N N  Doing errands, shopping? N N  Preparing Food and eating ? N N  Using the Toilet? N N  In the past six months, have you accidently leaked urine? Y N  Do you have problems with loss of bowel control? Y N  Managing your  Medications? N Y  Managing your Finances? N Y  Housekeeping or managing your Housekeeping? N Y    Immunizations and Health Maintenance Immunization History  Administered Date(s) Administered  . H1N1 06/09/2008  . Influenza Split 05/01/2011, 05/18/2012  . Influenza Whole 09/23/2004, 06/01/2007, 05/03/2008, 05/03/2009, 04/10/2010  . Influenza, High Dose Seasonal PF 06/02/2013  . Influenza,inj,Quad PF,36+ Mos 05/03/2014, 04/24/2015  . PPD Test 12/04/2009  . Pneumococcal Polysaccharide-23 02/02/2007, 07/04/2010, 04/24/2015  . Td 11/24/2005   Health Maintenance Due  Topic Date Due  . OPHTHALMOLOGY EXAM  05/18/1956    Patient Care Team: Mosie Lukes,  MD as PCP - General (Family Medicine) Michel Bickers, MD as PCP - Infectious Diseases (Infectious Diseases) Royann Shivers, MD as Referring Physician (Nephrology) Elayne Snare, MD as Consulting Physician (Endocrinology) Milus Banister, MD as Attending Physician (Gastroenterology) Lelon Perla, MD as Consulting Physician (Cardiology) Joseph Art, OD as Referring Physician (Optometry)  Indicate any recent Medical Services you may have received from other than [facility] providers in the past year (date may be approximate).    Assessment:   This is a routine wellness examination for [name].   Hearing/Vision screen  Hearing Screening   125Hz  250Hz  500Hz  1000Hz  2000Hz  4000Hz  8000Hz   Right ear:   Fail Fail Fail Fail   Left ear:   Pass Pass Pass Fail   Comments: Hearing loss- right ear  Vision Screening Comments: Last eye exam:  03/2015 Wears corrective glasses No problems seeing with glasses  Dietary issues and exercise activities discussed: Current Exercise Habits: Home exercise routine (Also goes to cardiac rehab), Type of exercise: walking, Time (Minutes): 50, Frequency (Times/Week): 5, Weekly Exercise (Minutes/Week): 250   Diet:  Regular diet.  Eats 3 meals per day.  Limits carb intake.  Drinks 96 oz of water daily.    Goals     . Continue to exercise regularly.        Depression Screen PHQ 2/9 Scores 10/12/2015 09/03/2015 08/21/2015 08/13/2015  PHQ - 2 Score 0 0 0 0    Fall Risk Fall Risk  10/12/2015 08/21/2015 08/13/2015 05/16/2015 04/24/2015  Falls in the past year? No Yes No No No  Risk for fall due to : - - - - -  Risk for fall due to (comments): - - - - -    Cognitive Function: MMSE - Mini Mental State Exam 10/12/2015  Orientation to time 5  Orientation to Place 5  Registration 3  Attention/ Calculation 5  Recall 3  Language- name 2 objects 2  Language- repeat 1  Language- follow 3 step command 3  Language- read & follow direction 1  Write a sentence 1  Copy design 1  Total score 30    Screening Tests Health Maintenance  Topic Date Due  . OPHTHALMOLOGY EXAM  05/18/1956  . TETANUS/TDAP  11/25/2015  . HEMOGLOBIN A1C  01/21/2016  . FOOT EXAM  02/28/2016  . INFLUENZA VACCINE  03/04/2016  . PNA vac Low Risk Adult (2 of 2 - PCV13) 04/23/2016  . COLONOSCOPY  07/21/2017  . Hepatitis C Screening  Completed        Plan:  Will call your eye doctor for a copy of your records.    Continue doing brain stimulating activities (puzzles, reading, adult coloring books, staying active) to keep memory sharp.   Continue to eat heart healthy diet (full of fruits, vegetables, whole grains, lean protein, water--limit salt, fat, and sugar intake) and increase physical activity as tolerated.  Follow up with Dr. Charlett Blake as scheduled in May.     During the course of the visit Vikas was educated and counseled about the following appropriate screening and preventive services:   Vaccines to include Pneumoccal, Influenza, Hepatitis B, Td, Zostavax, HCV  Electrocardiogram  Colorectal cancer screening  Cardiovascular disease screening  Diabetes screening  Glaucoma screening  Nutrition counseling  Prostate cancer screening  Smoking cessation counseling  Patient Instructions (the written plan) were given to  the patient.   Rudene Anda, RN   10/15/2015

## 2015-10-15 ENCOUNTER — Other Ambulatory Visit: Payer: Medicare Other

## 2015-10-15 ENCOUNTER — Encounter (HOSPITAL_COMMUNITY)
Admission: RE | Admit: 2015-10-15 | Discharge: 2015-10-15 | Disposition: A | Discharge status: Home / Self Care | Payer: Medicare Other | Source: Ambulatory Visit | Attending: Cardiology | Admitting: Cardiology

## 2015-10-15 DIAGNOSIS — B20 Human immunodeficiency virus [HIV] disease: Secondary | ICD-10-CM | POA: Diagnosis not present

## 2015-10-15 DIAGNOSIS — Z79899 Other long term (current) drug therapy: Secondary | ICD-10-CM

## 2015-10-15 DIAGNOSIS — I2119 ST elevation (STEMI) myocardial infarction involving other coronary artery of inferior wall: Secondary | ICD-10-CM | POA: Diagnosis not present

## 2015-10-15 LAB — CBC
HEMATOCRIT: 32.6 % — AB (ref 39.0–52.0)
HEMOGLOBIN: 11.1 g/dL — AB (ref 13.0–17.0)
MCH: 30.7 pg (ref 26.0–34.0)
MCHC: 34 g/dL (ref 30.0–36.0)
MCV: 90.3 fL (ref 78.0–100.0)
MPV: 9 fL (ref 8.6–12.4)
Platelets: 249 10*3/uL (ref 150–400)
RBC: 3.61 MIL/uL — AB (ref 4.22–5.81)
RDW: 14.4 % (ref 11.5–15.5)
WBC: 7 10*3/uL (ref 4.0–10.5)

## 2015-10-15 LAB — COMPREHENSIVE METABOLIC PANEL
ALBUMIN: 3.9 g/dL (ref 3.6–5.1)
ALT: 19 U/L (ref 9–46)
AST: 14 U/L (ref 10–35)
Alkaline Phosphatase: 74 U/L (ref 40–115)
BUN: 31 mg/dL — ABNORMAL HIGH (ref 7–25)
CHLORIDE: 101 mmol/L (ref 98–110)
CO2: 26 mmol/L (ref 20–31)
CREATININE: 1.52 mg/dL — AB (ref 0.70–1.25)
Calcium: 9.3 mg/dL (ref 8.6–10.3)
Glucose, Bld: 187 mg/dL — ABNORMAL HIGH (ref 65–99)
POTASSIUM: 4 mmol/L (ref 3.5–5.3)
SODIUM: 141 mmol/L (ref 135–146)
Total Bilirubin: 0.4 mg/dL (ref 0.2–1.2)
Total Protein: 6.7 g/dL (ref 6.1–8.1)

## 2015-10-15 LAB — LIPID PANEL
Cholesterol: 113 mg/dL — ABNORMAL LOW (ref 125–200)
HDL: 28 mg/dL — AB (ref >=40)
LDL Cholesterol: 59 mg/dL (ref <130)
TRIGLYCERIDES: 130 mg/dL (ref <150)
Total CHOL/HDL Ratio: 4 Ratio (ref <=5.0)
VLDL: 26 mg/dL (ref <30)

## 2015-10-16 DIAGNOSIS — B20 Human immunodeficiency virus [HIV] disease: Secondary | ICD-10-CM | POA: Diagnosis not present

## 2015-10-16 LAB — T-HELPER CELL (CD4) - (RCID CLINIC ONLY)
CD4 % Helper T Cell: 43 % (ref 33–55)
CD4 T CELL ABS: 410 /uL (ref 400–2700)

## 2015-10-16 LAB — HIV-1 RNA QUANT-NO REFLEX-BLD
HIV 1 RNA Quant: <20 copies/mL (ref <20)
HIV-1 RNA Quant, Log: <1.3 Log copies/mL (ref <1.30)

## 2015-10-16 LAB — RPR

## 2015-10-17 ENCOUNTER — Encounter (HOSPITAL_COMMUNITY)
Admission: RE | Admit: 2015-10-17 | Discharge: 2015-10-17 | Disposition: A | Discharge status: Home / Self Care | Payer: Medicare Other | Source: Ambulatory Visit | Attending: Cardiology | Admitting: Cardiology

## 2015-10-17 DIAGNOSIS — I2119 ST elevation (STEMI) myocardial infarction involving other coronary artery of inferior wall: Secondary | ICD-10-CM | POA: Diagnosis not present

## 2015-10-18 ENCOUNTER — Other Ambulatory Visit: Payer: Self-pay | Admitting: Internal Medicine

## 2015-10-18 DIAGNOSIS — B20 Human immunodeficiency virus [HIV] disease: Secondary | ICD-10-CM

## 2015-10-19 ENCOUNTER — Other Ambulatory Visit (INDEPENDENT_AMBULATORY_CARE_PROVIDER_SITE_OTHER): Payer: Medicare Other

## 2015-10-19 ENCOUNTER — Encounter (HOSPITAL_COMMUNITY)
Admission: RE | Admit: 2015-10-19 | Discharge: 2015-10-19 | Disposition: A | Discharge status: Home / Self Care | Payer: Medicare Other | Source: Ambulatory Visit | Attending: Cardiology | Admitting: Cardiology

## 2015-10-19 DIAGNOSIS — I2119 ST elevation (STEMI) myocardial infarction involving other coronary artery of inferior wall: Secondary | ICD-10-CM | POA: Diagnosis not present

## 2015-10-19 DIAGNOSIS — E1165 Type 2 diabetes mellitus with hyperglycemia: Secondary | ICD-10-CM

## 2015-10-19 LAB — BASIC METABOLIC PANEL
BUN: 35 mg/dL — ABNORMAL HIGH (ref 6–23)
CO2: 28 mEq/L (ref 19–32)
Calcium: 10.2 mg/dL (ref 8.4–10.5)
Chloride: 99 mEq/L (ref 96–112)
Creatinine, Ser: 1.6 mg/dL — ABNORMAL HIGH (ref 0.40–1.50)
GFR: 46 mL/min — AB (ref >60.00)
GLUCOSE: 185 mg/dL — AB (ref 70–99)
POTASSIUM: 3.8 meq/L (ref 3.5–5.1)
Sodium: 138 mEq/L (ref 135–145)

## 2015-10-19 LAB — HEMOGLOBIN A1C: HEMOGLOBIN A1C: 6.6 % — AB (ref 4.6–6.5)

## 2015-10-19 MED ORDER — RITONAVIR 100 MG PO TABS: 100.0000 mg | ORAL_TABLET | Freq: Every day | ORAL | Status: DC

## 2015-10-19 MED ORDER — DARUNAVIR ETHANOLATE 800 MG PO TABS: 800.0000 mg | ORAL_TABLET | Freq: Every day | ORAL | Status: DC

## 2015-10-20 ENCOUNTER — Encounter: Payer: Self-pay | Admitting: Cardiology

## 2015-10-20 LAB — FRUCTOSAMINE: Fructosamine: 324 umol/L — ABNORMAL HIGH (ref 0–285)

## 2015-10-22 ENCOUNTER — Encounter (HOSPITAL_COMMUNITY)
Admission: RE | Admit: 2015-10-22 | Discharge: 2015-10-22 | Disposition: A | Discharge status: Home / Self Care | Payer: Medicare Other | Source: Ambulatory Visit | Attending: Cardiology | Admitting: Cardiology

## 2015-10-22 DIAGNOSIS — I2119 ST elevation (STEMI) myocardial infarction involving other coronary artery of inferior wall: Secondary | ICD-10-CM | POA: Diagnosis not present

## 2015-10-24 ENCOUNTER — Ambulatory Visit: Payer: Self-pay | Admitting: Endocrinology

## 2015-10-24 ENCOUNTER — Ambulatory Visit (INDEPENDENT_AMBULATORY_CARE_PROVIDER_SITE_OTHER): Payer: Medicare Other | Admitting: Endocrinology

## 2015-10-24 ENCOUNTER — Encounter (HOSPITAL_COMMUNITY)
Admission: RE | Admit: 2015-10-24 | Discharge: 2015-10-24 | Disposition: A | Discharge status: Home / Self Care | Payer: Medicare Other | Source: Ambulatory Visit | Attending: Cardiology | Admitting: Cardiology

## 2015-10-24 ENCOUNTER — Encounter: Payer: Self-pay | Admitting: Endocrinology

## 2015-10-24 VITALS — BP 122/54 | HR 72 | Temp 97.5°F | Resp 14 | Ht 63.0 in | Wt 131.6 lb

## 2015-10-24 DIAGNOSIS — E119 Type 2 diabetes mellitus without complications: Secondary | ICD-10-CM | POA: Diagnosis not present

## 2015-10-24 DIAGNOSIS — I2510 Atherosclerotic heart disease of native coronary artery without angina pectoris: Secondary | ICD-10-CM | POA: Diagnosis not present

## 2015-10-24 DIAGNOSIS — I2119 ST elevation (STEMI) myocardial infarction involving other coronary artery of inferior wall: Secondary | ICD-10-CM | POA: Diagnosis not present

## 2015-10-24 MED ORDER — PIOGLITAZONE HCL 15 MG PO TABS: 15.0000 mg | ORAL_TABLET | Freq: Every day | ORAL | Status: DC

## 2015-10-24 NOTE — Progress Notes (Signed)
Patient ID: [name], male   DOB: 02/25/1946, 70 y.o.   MRN: [id number]           Reason for Appointment: Follow-up for Type 2 Diabetes  Referring physician: Charlett Blake  History of Present Illness:          Date of diagnosis of type 2 diabetes mellitus: 2013        Background history:  He only mildly increased blood sugar levels at the time of diagnosis and A1c was 7.1  He had been treated initially with metformin but this was stopped subsequently when his renal function was worse  He was subsequently treated with glipizide but his blood sugars had been poorly controlled in 2016 with glipizide alone Since his A1c has been persistently over 9% he was started on Januvia 50 mg in 01/2015 Prior to this he thinks his blood sugars were in the 200-300 range and he was having symptoms of blurred vision and some fatigue  Recent history:   With continued high A1c of 9.5 he was switched from glipizide to Prandin in 02/2015 He thinks he has taken this fairly regularly before each meal, now taking 4 mg before breakfast and supper and 2 mg before lunch. He was also referred to the dietitian   His A1c is now 6.6, has been between 6.4-6.8 more recently However this is falsely low as his fructosamine is high at 324  Current blood sugar patterns and problems identified:  He appears to have significantly high readings after breakfast; however this is inconsistent and depend on what he is eating.  Mostly getting high readings with higher carbohydrate foods like cereal or oatmeal and not consistently trying to get protein  He does not check his blood sugars after lunch  However despite reminders he has not checked more than a couple of readings after evening meal which are variable, once had a low sugar even though he thought he had a good meal  Has been more active with doing cardiac rehabilitation  He is fairly compliant with taking his Prandin before eating, 4 mg at breakfast   Oral hypoglycemic  drugs the patient is taking are: Prandin before meals as above Side effects from medications have been: None  Dinner 5-6 pm  Compliance with the medical regimen: Good, usually trying to take his Prandin before eating  Hypoglycemia: None    Glucose monitoring:  done usually 1-2  times a day         Glucometer:  FreeStyle     Blood Glucose readings by download:  Mean values apply above for all meters except median for One Touch  PRE-MEAL Fasting Lunch Dinner Bedtime Overall  Glucose range: 100-188  124-281      Mean/median: 140  195    151   POST-MEAL PC Breakfast PC Lunch PC Dinner  Glucose range:   59-194   Mean/median:       Dietician visit, most recent: 8/15 DIET:  has been trying to reduce fried foods; eating instant oatmeal in the morning               Exercise:  recently walking with rehab 3/7 days a week  Weight history:   Wt Readings from Last 3 Encounters:  10/24/15 131 lb 9.6 oz (59.693 kg)  10/12/15 132 lb 12.8 oz (60.238 kg)  09/17/15 131 lb (59.421 kg)    Glycemic control:   Lab Results  Component Value Date   HGBA1C 6.6* 10/19/2015   HGBA1C 6.4  07/23/2015   HGBA1C 6.8 04/26/2015   Lab Results  Component Value Date   MICROALBUR 5.7* 01/19/2015   LDLCALC 59 10/15/2015   CREATININE 1.60* 10/19/2015   Lab on 10/19/2015  Component Date Value Ref Range Status  . Hgb A1c MFr Bld 10/19/2015 6.6* 4.6 - 6.5 % Final   Glycemic Control Guidelines for People with Diabetes:Non Diabetic:  <6%Goal of Therapy: <7%Additional Action Suggested:  >8%   . Sodium 10/19/2015 138  135 - 145 mEq/L Final  . Potassium 10/19/2015 3.8  3.5 - 5.1 mEq/L Final  . Chloride 10/19/2015 99  96 - 112 mEq/L Final  . CO2 10/19/2015 28  19 - 32 mEq/L Final  . Glucose, Bld 10/19/2015 185* 70 - 99 mg/dL Final  . BUN 10/19/2015 35* 6 - 23 mg/dL Final  . Creatinine, Ser 10/19/2015 1.60* 0.40 - 1.50 mg/dL Final  . Calcium 10/19/2015 10.2  8.4 - 10.5 mg/dL Final  . GFR 10/19/2015 46.00*  >60.00 mL/min Final  . Fructosamine 10/19/2015 324* 0 - 285 umol/L Final   Comment: Published reference interval for apparently healthy subjects between age 61 and 20 is 58 - 285 umol/L and in a poorly controlled diabetic population is 228 - 563 umol/L with a mean of 396 umol/L.          Medication List       This list is accurate as of: 10/24/15 11:59 PM.  Always use your most recent med list.               amLODipine 10 MG tablet  Commonly known as:  NORVASC  Take 1 tablet (10 mg total) by mouth at bedtime.     aspirin 81 MG chewable tablet  Chew 81 mg by mouth daily.     atorvastatin 20 MG tablet  Commonly known as:  LIPITOR  Take 1 tablet (20 mg total) by mouth daily.     b complex vitamins tablet  Take 1 tablet by mouth daily.     Cholecalciferol 2000 units Caps  Take 1 capsule (2,000 Units total) by mouth daily.     clotrimazole-betamethasone cream  Commonly known as:  LOTRISONE  Apply 1 application topically 2 (two) times daily. apply to lesion on chest wall     Darunavir Ethanolate 800 MG tablet  Commonly known as:  PREZISTA  Take 1 tablet (800 mg total) by mouth daily.     EDURANT 25 MG Tabs tablet  Generic drug:  rilpivirine  TAKE 1 TABLET BY MOUTH DAILY WITH BREAKFAST     furosemide 20 MG tablet  Commonly known as:  LASIX  Take 40 mg by mouth daily.     Krill Oil Omega-3 300 MG Caps  Take 1 capsule daily     lisinopril 5 MG tablet  Commonly known as:  PRINIVIL,ZESTRIL  Take 2.5 mg by mouth every other day.     metolazone 2.5 MG tablet  Commonly known as:  ZAROXOLYN  TAKE 1 TABLET BY MOUTH EVERY DAY 20 MINUTES PRIOR TO LASIX     nitroGLYCERIN 0.4 MG SL tablet  Commonly known as:  NITROSTAT  Place 1 tablet (0.4 mg total) under the tongue every 5 (five) minutes as needed for chest pain.     pantoprazole 40 MG tablet  Commonly known as:  PROTONIX  Take 1 tablet (40 mg total) by mouth daily.     pioglitazone 15 MG tablet  Commonly known  as:  ACTOS  Take 1 tablet (15 mg  total) by mouth daily.     potassium chloride SA 20 MEQ tablet  Commonly known as:  K-DUR,KLOR-CON  Take 1 tablet (20 mEq total) by mouth 3 (three) times daily.     prasugrel 10 MG Tabs tablet  Commonly known as:  EFFIENT  Take 1 tablet (10 mg total) by mouth daily with supper.     PROBIOTIC DAILY Caps  Take 1 by mouth daily     repaglinide 2 MG tablet  Commonly known as:  PRANDIN  TAKE 2 TABLETS BY MOUTH BEFORE BREAKFAST, 1 TABLET BY MOUTH BEFORE LUNCH AND 2 TABLETS BY MOUTH BEFORE SUPPER     ritonavir 100 MG Tabs tablet  Commonly known as:  NORVIR  Take 1 tablet (100 mg total) by mouth daily.     sitaGLIPtin 50 MG tablet  Commonly known as:  JANUVIA  Take 1 tablet (50 mg total) by mouth daily.     sodium bicarbonate 650 MG tablet  Take 650 mg by mouth 3 (three) times daily.     TIVICAY 50 MG tablet  Generic drug:  dolutegravir  TAKE 1 TABLET BY MOUTH DAILY     triamcinolone cream 0.1 %  Commonly known as:  KENALOG  APP TO DRY ITCHY SKIN BID PRN     valACYclovir 500 MG tablet  Commonly known as:  VALTREX  Take 1 tablet (500 mg total) by mouth daily.        Allergies: No Known Allergies  Past Medical History  Diagnosis Date  . Arthritis   . Diabetes mellitus   . History of depression   . GERD (gastroesophageal reflux disease)   . CAD (coronary artery disease)     2003- cabg  . Hypertension   . Hyperlipidemia   . History of kidney stones   . COPD (chronic obstructive pulmonary disease) ([place])   . HIV (human immunodeficiency virus infection) ([place]) 1991    on meds since initial dx.   . Dermatitis 11/27/2012  . Nocturia 03/06/2013  . Epicondylitis 06/05/2013    right  . Pancreatitis 11/2013    attributed to HIV meds.   . Constipation 05/28/2014  . Pain in joint, ankle and foot 01/19/2015  . Chronic kidney disease     Past Surgical History  Procedure Laterality Date  . Coronary artery bypass graft  05/2002  . Vasectomy      . Cardiac catheterization N/A 07/29/2015    Procedure: Left Heart Cath and Coronary Angiography;  Surgeon: [name], MD;  Location: [facility];  Service: Cardiovascular;  Laterality: N/A;  . Cardiac catheterization N/A 07/29/2015    Procedure: Coronary Stent Intervention;  Surgeon: [name], MD;  Location: [facility];  Service: Cardiovascular;  Laterality: N/A;    Family History  Problem Relation Age of Onset  . Arthritis      mother/father/paternal grandparents  . Breast cancer Maternal Aunt     paternal aunt  . Lung cancer Maternal Aunt   . Hyperlipidemia Mother   . Hyperlipidemia Father   . Heart disease      parents/maternal grandparents/ 2 brothers  . Stroke Paternal Grandmother   . Hypertension Father     paternal grandmother/3 brothers/1 sister  . Mental retardation Sister   . Diabetes Mother     paternal grandparents/1 brother    Social History:  reports that he has never smoked. He has never used smokeless tobacco. He reports that he does not drink alcohol or use illicit  drugs.    Review of Systems    Lipid history: On Lipitor 10 mg, followed by PCP    Lab Results  Component Value Date   CHOL 113* 10/15/2015   HDL 28* 10/15/2015   LDLCALC 59 10/15/2015   LDLDIRECT 72.0 04/27/2015   TRIG 130 10/15/2015   CHOLHDL 4.0 10/15/2015            RENAL dysfunction: Followed by nephrologist, etiology unclear but creatinine is stable  Lab Results  Component Value Date   CREATININE 1.60* 10/19/2015   BUN 35* 10/19/2015   NA 138 10/19/2015   K 3.8 10/19/2015   CL 99 10/19/2015   CO2 28 10/19/2015     Hypertension: Has been treated with various drugs in the past, now taking  amlodipine and low-dose lisinopril  Diabetic foot exam  in 7/16 shows normal monofilament sensation in the toes and plantar surfaces, no skin lesions or ulcers on the feet and practically absent pedal pulses      Physical Examination:  BP 122/54 mmHg   Pulse 72  Temp(Src) 97.5 F (36.4 C)  Resp 14  Ht 5\' 3"  (1.6 m)  Wt 131 lb 9.6 oz (59.693 kg)  BMI 23.32 kg/m2  SpO2 98%           ASSESSMENT:  Diabetes type 2, uncontrolled with BMI 23 but has some abdominal obesity    He appears to have fair  control with taking relatively large doses of Prandin along with Januvia His fructosamine indicates overall high readings and A1c is probably lower than expected because of his renal insufficiency He does appear to have high readings after breakfast with glucose going up based on his intake  He has not done readings after lunch and dinner  PLAN:   Continue Prandin 4 mg at breakfast and supper and stay on 2 mg at lunch  He will try to get consistent amount of protein at breakfast and avoid high carbohydrate and high sugar products like instant oatmeal  Needs to check more readings after dinner consistently and less in the morning   Patient Instructions  More sugars at bedtime/ 8-9 pm   Check blood sugars on waking up 3  times a week Also check blood sugars about 2 hours after a meal and do this after different meals by rotation  Recommended blood sugar levels on waking up is 90-130 and about 2 hours after meal is 130-160  Please bring your blood sugar monitor to each visit, thank you     [facility] 10/25/2015, 10:20 AM   Note: This office note was prepared with Estate agent. Any transcriptional errors that result from this process are unintentional.

## 2015-10-24 NOTE — Patient Instructions (Signed)
More sugars at bedtime/ 8-9 pm   Check blood sugars on waking up 3  times a week Also check blood sugars about 2 hours after a meal and do this after different meals by rotation  Recommended blood sugar levels on waking up is 90-130 and about 2 hours after meal is 130-160  Please bring your blood sugar monitor to each visit, thank you

## 2015-10-26 ENCOUNTER — Encounter (HOSPITAL_COMMUNITY)
Admission: RE | Admit: 2015-10-26 | Discharge: 2015-10-26 | Disposition: A | Discharge status: Home / Self Care | Payer: Medicare Other | Source: Ambulatory Visit | Attending: Cardiology | Admitting: Cardiology

## 2015-10-26 DIAGNOSIS — I2119 ST elevation (STEMI) myocardial infarction involving other coronary artery of inferior wall: Secondary | ICD-10-CM | POA: Diagnosis not present

## 2015-10-29 ENCOUNTER — Encounter (HOSPITAL_COMMUNITY)
Admission: RE | Admit: 2015-10-29 | Discharge: 2015-10-29 | Disposition: A | Discharge status: Home / Self Care | Payer: Medicare Other | Source: Ambulatory Visit | Attending: Cardiology | Admitting: Cardiology

## 2015-10-29 ENCOUNTER — Encounter: Payer: Self-pay | Admitting: Internal Medicine

## 2015-10-29 ENCOUNTER — Ambulatory Visit (INDEPENDENT_AMBULATORY_CARE_PROVIDER_SITE_OTHER): Payer: Medicare Other | Admitting: Internal Medicine

## 2015-10-29 DIAGNOSIS — I2510 Atherosclerotic heart disease of native coronary artery without angina pectoris: Secondary | ICD-10-CM

## 2015-10-29 DIAGNOSIS — I2119 ST elevation (STEMI) myocardial infarction involving other coronary artery of inferior wall: Secondary | ICD-10-CM | POA: Diagnosis not present

## 2015-10-29 DIAGNOSIS — B20 Human immunodeficiency virus [HIV] disease: Secondary | ICD-10-CM | POA: Diagnosis not present

## 2015-10-29 NOTE — Assessment & Plan Note (Signed)
His infection remains under excellent control. He will continue on his current antiretroviral regimen and follow-up after lab work in 6 months.

## 2015-10-29 NOTE — Progress Notes (Signed)
Patient Active Problem List   Diagnosis Date Noted  . DM (diabetes mellitus), type 2 with ophthalmic complications ([place]) [id number]    Priority: High  . Human immunodeficiency virus (HIV) disease ([place]) 05/12/2006    Priority: High  . Coronary atherosclerosis 05/12/2006    Priority: High  . Aortic stenosis 09/17/2015  . STEMI (ST elevation myocardial infarction) ([place]) 07/29/2015  . Subsequent ST elevation (STEMI) myocardial infarction of inferior wall ([place]) 07/29/2015  . Otitis, externa, infective 07/21/2015  . Lesion of breast 07/21/2015  . Carotid bruit 07/10/2015  . URI (upper respiratory infection) 02/02/2015  . Capsulitis of foot 01/24/2015  . Metatarsal deformity 01/24/2015  . Keratoma 01/24/2015  . Pain in joint, ankle and foot 01/19/2015  . Skin lesion 10/18/2014  . Scratch 09/27/2014  . Absolute anemia 05/28/2014  . Constipation 05/28/2014  . Pedal edema 05/21/2014  . Posterior cervical lymphadenopathy 03/30/2014  . Metabolic acidosis [id number]  . Hyperkalemia 03/23/2014  . Hyponatremia 03/23/2014  . Protein-calorie malnutrition, severe ([place]) 03/23/2014  . Diarrhea 11/20/2013  . HLD (hyperlipidemia) 11/19/2013  . Ascites 11/18/2013  . Pancreatitis 11/15/2013  . Epicondylitis 06/05/2013  . Nocturia 03/06/2013  . Inguinal adenopathy 01/11/2013  . Dermatitis 11/27/2012  . Night sweats 07/06/2012  . Erectile dysfunction 01/01/2012  . Lipodystrophy 12/20/2010  . Dry eye syndrome 12/20/2010  . BELLS PALSY 07/19/2010  . GENITAL HERPES 05/03/2009  . SHINGLES, HX OF 05/03/2009  . WEIGHT LOSS, ABNORMAL 04/03/2009  . INGUINAL LYMPHADENOPATHY, RIGHT 04/03/2009  . MEMORY LOSS 09/18/2008  . PERIPHERAL VASCULAR DISEASE 07/20/2008  . COPD 07/20/2008  . HIP PAIN, BILATERAL 07/17/2008  . KNEE PAIN, BILATERAL 07/17/2008  . HEMATOCHEZIA 04/18/2008  . NEPHROLITHIASIS, HX OF 02/22/2008  . DEPRESSION 09/03/2006  . GERD 09/03/2006  . CORONARY ARTERY BYPASS  GRAFT, HX OF 09/03/2006  . HEARING LOSS, SENSORINEURAL 05/12/2006  . Essential hypertension 05/12/2006    Patient's Medications  New Prescriptions   No medications on file  Previous Medications   AMLODIPINE (NORVASC) 10 MG TABLET    Take 1 tablet (10 mg total) by mouth at bedtime.   ASPIRIN 81 MG CHEWABLE TABLET    Chew 81 mg by mouth daily.   ATORVASTATIN (LIPITOR) 20 MG TABLET    Take 1 tablet (20 mg total) by mouth daily.   B COMPLEX VITAMINS TABLET    Take 1 tablet by mouth daily.   CHOLECALCIFEROL 2000 UNITS CAPS    Take 1 capsule (2,000 Units total) by mouth daily.   CLOTRIMAZOLE-BETAMETHASONE (LOTRISONE) CREAM    Apply 1 application topically 2 (two) times daily. apply to lesion on chest wall   DARUNAVIR ETHANOLATE (PREZISTA) 800 MG TABLET    Take 1 tablet (800 mg total) by mouth daily.   EDURANT 25 MG TABS TABLET    TAKE 1 TABLET BY MOUTH DAILY WITH BREAKFAST   FUROSEMIDE (LASIX) 20 MG TABLET    Take 40 mg by mouth daily.   KRILL OIL OMEGA-3 300 MG CAPS    Take 1 capsule daily   LISINOPRIL (PRINIVIL,ZESTRIL) 5 MG TABLET    Take 2.5 mg by mouth every other day.   METOLAZONE (ZAROXOLYN) 2.5 MG TABLET    TAKE 1 TABLET BY MOUTH EVERY DAY 20 MINUTES PRIOR TO LASIX   NITROGLYCERIN (NITROSTAT) 0.4 MG SL TABLET    Place 1 tablet (0.4 mg total) under the tongue every 5 (five) minutes as needed for chest pain.   PANTOPRAZOLE (PROTONIX) 40  MG TABLET    Take 1 tablet (40 mg total) by mouth daily.   PIOGLITAZONE (ACTOS) 15 MG TABLET    Take 1 tablet (15 mg total) by mouth daily.   POTASSIUM CHLORIDE SA (K-DUR,KLOR-CON) 20 MEQ TABLET    Take 1 tablet (20 mEq total) by mouth 3 (three) times daily.   PRASUGREL (EFFIENT) 10 MG TABS TABLET    Take 1 tablet (10 mg total) by mouth daily with supper.   PROBIOTIC PRODUCT (PROBIOTIC DAILY) CAPS    Take 1 by mouth daily   REPAGLINIDE (PRANDIN) 2 MG TABLET    TAKE 2 TABLETS BY MOUTH BEFORE BREAKFAST, 1 TABLET BY MOUTH BEFORE LUNCH AND 2 TABLETS BY MOUTH  BEFORE SUPPER   RITONAVIR (NORVIR) 100 MG TABS TABLET    Take 1 tablet (100 mg total) by mouth daily.   SITAGLIPTIN (JANUVIA) 50 MG TABLET    Take 1 tablet (50 mg total) by mouth daily.   SODIUM BICARBONATE 650 MG TABLET    Take 650 mg by mouth 3 (three) times daily.   TIVICAY 50 MG TABLET    TAKE 1 TABLET BY MOUTH DAILY   TRIAMCINOLONE CREAM (KENALOG) 0.1 %    APP TO DRY ITCHY SKIN BID PRN   VALACYCLOVIR (VALTREX) 500 MG TABLET    Take 1 tablet (500 mg total) by mouth daily.  Modified Medications   No medications on file  Discontinued Medications   No medications on file    Subjective: [name] is in for his routine HIV follow-up visit. He denies missing any doses of his Liborio Nixon, Tivicay, Prezista were Norvir. He is not having any problems tolerating his medications. He is currently in cardiac rehabilitation following an MI and a number. He is feeling better.  Review of Systems: Review of Systems  Constitutional: Negative for fever, chills, weight loss, malaise/fatigue and diaphoresis.  HENT: Negative for sore throat.   Respiratory: Negative for cough, sputum production and shortness of breath.   Cardiovascular: Negative for chest pain.  Gastrointestinal: Negative for nausea, vomiting, abdominal pain and diarrhea.  Genitourinary: Negative for dysuria.  Musculoskeletal: Negative for myalgias and joint pain.  Skin: Negative for rash.  Neurological: Negative for dizziness and headaches.    Past Medical History  Diagnosis Date  . Arthritis   . Diabetes mellitus   . History of depression   . GERD (gastroesophageal reflux disease)   . CAD (coronary artery disease)     2003- cabg  . Hypertension   . Hyperlipidemia   . History of kidney stones   . COPD (chronic obstructive pulmonary disease) ([place])   . HIV (human immunodeficiency virus infection) ([place]) 1991    on meds since initial dx.   . Dermatitis 11/27/2012  . Nocturia 03/06/2013  . Epicondylitis 06/05/2013    right  .  Pancreatitis 11/2013    attributed to HIV meds.   . Constipation 05/28/2014  . Pain in joint, ankle and foot 01/19/2015  . Chronic kidney disease     Social History  Substance Use Topics  . Smoking status: Never Smoker   . Smokeless tobacco: Never Used  . Alcohol Use: No    Family History  Problem Relation Age of Onset  . Arthritis      mother/father/paternal grandparents  . Breast cancer Maternal Aunt     paternal aunt  . Lung cancer Maternal Aunt   . Hyperlipidemia Mother   . Hyperlipidemia Father   . Heart disease      parents/maternal  grandparents/ 2 brothers  . Stroke Paternal Grandmother   . Hypertension Father     paternal grandmother/3 brothers/1 sister  . Mental retardation Sister   . Diabetes Mother     paternal grandparents/1 brother    No Known Allergies  Objective:  Filed Vitals:   10/29/15 1535  BP: 112/63  Pulse: 82  Temp: 98.4 F (36.9 C)  TempSrc: Oral  Weight: 129 lb 8 oz (58.741 kg)   Body mass index is 22.95 kg/(m^2).  Physical Exam  Constitutional: He is oriented to person, place, and time.  HENT:  Mouth/Throat: No oropharyngeal exudate.  Eyes: Conjunctivae are normal.  Cardiovascular: Normal rate and regular rhythm.   No murmur heard. Pulmonary/Chest: Breath sounds normal.  Abdominal: Soft. There is no tenderness.  Musculoskeletal: Normal range of motion.  Neurological: He is alert and oriented to person, place, and time.  Skin: No rash noted.  Psychiatric: Mood and affect normal.    Lab Results Lab Results  Component Value Date   WBC 7.0 10/15/2015   HGB 11.1* 10/15/2015   HCT 32.6* 10/15/2015   MCV 90.3 10/15/2015   PLT 249 10/15/2015    Lab Results  Component Value Date   CREATININE 1.60* 10/19/2015   BUN 35* 10/19/2015   NA 138 10/19/2015   K 3.8 10/19/2015   CL 99 10/19/2015   CO2 28 10/19/2015    Lab Results  Component Value Date   ALT 19 10/15/2015   AST 14 10/15/2015   ALKPHOS 74 10/15/2015   BILITOT 0.4  10/15/2015    Lab Results  Component Value Date   CHOL 113* 10/15/2015   HDL 28* 10/15/2015   LDLCALC 59 10/15/2015   LDLDIRECT 72.0 04/27/2015   TRIG 130 10/15/2015   CHOLHDL 4.0 10/15/2015    Lab Results HIV 1 RNA QUANT (copies/mL)  Date Value  10/15/2015 <20  04/10/2015 <20  09/04/2014 <20   HIV-1 RNA VIRAL LOAD (no units)  Date Value  07/14/2013 <40  04/13/2013 <40  01/18/2013 <40   CD4 (no units)  Date Value  07/14/2013 514  04/13/2013 498  01/18/2013 522   CD4 T CELL ABS (/uL)  Date Value  10/15/2015 410  04/10/2015 400  09/04/2014 320*      Problem List Items Addressed This Visit      High   Human immunodeficiency virus (HIV) disease ([place])    His infection remains under excellent control. He will continue on his current antiretroviral regimen and follow-up after lab work in 6 months.      Relevant Orders   T-helper cell (CD4)- ([facility] clinic only)   HIV 1 RNA quant-no reflex-bld        Michel Bickers, MD [facility] [phone] pager   [phone] cell 10/29/2015, 3:57 PM

## 2015-10-30 ENCOUNTER — Ambulatory Visit: Payer: Self-pay | Admitting: Internal Medicine

## 2015-10-31 ENCOUNTER — Encounter (HOSPITAL_COMMUNITY)
Admission: RE | Admit: 2015-10-31 | Discharge: 2015-10-31 | Disposition: A | Discharge status: Home / Self Care | Payer: Medicare Other | Source: Ambulatory Visit | Attending: Cardiology | Admitting: Cardiology

## 2015-10-31 DIAGNOSIS — I2119 ST elevation (STEMI) myocardial infarction involving other coronary artery of inferior wall: Secondary | ICD-10-CM | POA: Diagnosis not present

## 2015-11-02 ENCOUNTER — Encounter (HOSPITAL_COMMUNITY)
Admission: RE | Admit: 2015-11-02 | Discharge: 2015-11-02 | Disposition: A | Discharge status: Home / Self Care | Payer: Medicare Other | Source: Ambulatory Visit | Attending: Cardiology | Admitting: Cardiology

## 2015-11-02 DIAGNOSIS — I2119 ST elevation (STEMI) myocardial infarction involving other coronary artery of inferior wall: Secondary | ICD-10-CM | POA: Diagnosis not present

## 2015-11-05 ENCOUNTER — Encounter (HOSPITAL_COMMUNITY)
Admission: RE | Admit: 2015-11-05 | Discharge: 2015-11-05 | Disposition: A | Discharge status: Home / Self Care | Payer: Medicare Other | Source: Ambulatory Visit | Attending: Cardiology | Admitting: Cardiology

## 2015-11-05 DIAGNOSIS — I2119 ST elevation (STEMI) myocardial infarction involving other coronary artery of inferior wall: Secondary | ICD-10-CM | POA: Insufficient documentation

## 2015-11-07 ENCOUNTER — Encounter (HOSPITAL_COMMUNITY)
Admission: RE | Admit: 2015-11-07 | Discharge: 2015-11-07 | Disposition: A | Discharge status: Home / Self Care | Payer: Medicare Other | Source: Ambulatory Visit | Attending: Cardiology | Admitting: Cardiology

## 2015-11-07 DIAGNOSIS — I2119 ST elevation (STEMI) myocardial infarction involving other coronary artery of inferior wall: Secondary | ICD-10-CM | POA: Diagnosis not present

## 2015-11-09 ENCOUNTER — Encounter (HOSPITAL_COMMUNITY)
Admission: RE | Admit: 2015-11-09 | Discharge: 2015-11-09 | Disposition: A | Discharge status: Home / Self Care | Payer: Medicare Other | Source: Ambulatory Visit | Attending: Cardiology | Admitting: Cardiology

## 2015-11-09 DIAGNOSIS — I2119 ST elevation (STEMI) myocardial infarction involving other coronary artery of inferior wall: Secondary | ICD-10-CM | POA: Diagnosis not present

## 2015-11-12 ENCOUNTER — Encounter (HOSPITAL_COMMUNITY)
Admission: RE | Admit: 2015-11-12 | Discharge: 2015-11-12 | Disposition: A | Discharge status: Home / Self Care | Payer: Medicare Other | Source: Ambulatory Visit | Attending: Cardiology | Admitting: Cardiology

## 2015-11-12 DIAGNOSIS — I2119 ST elevation (STEMI) myocardial infarction involving other coronary artery of inferior wall: Secondary | ICD-10-CM | POA: Diagnosis not present

## 2015-11-14 ENCOUNTER — Encounter (HOSPITAL_COMMUNITY)
Admission: RE | Admit: 2015-11-14 | Discharge: 2015-11-14 | Disposition: A | Discharge status: Home / Self Care | Payer: Medicare Other | Source: Ambulatory Visit | Attending: Cardiology | Admitting: Cardiology

## 2015-11-14 DIAGNOSIS — I2119 ST elevation (STEMI) myocardial infarction involving other coronary artery of inferior wall: Secondary | ICD-10-CM | POA: Diagnosis not present

## 2015-11-15 ENCOUNTER — Other Ambulatory Visit: Payer: Self-pay | Admitting: Family Medicine

## 2015-11-16 ENCOUNTER — Encounter (HOSPITAL_COMMUNITY)
Admission: RE | Admit: 2015-11-16 | Discharge: 2015-11-16 | Disposition: A | Discharge status: Home / Self Care | Payer: Medicare Other | Source: Ambulatory Visit | Attending: Cardiology | Admitting: Cardiology

## 2015-11-16 DIAGNOSIS — I2119 ST elevation (STEMI) myocardial infarction involving other coronary artery of inferior wall: Secondary | ICD-10-CM | POA: Diagnosis not present

## 2015-11-18 ENCOUNTER — Other Ambulatory Visit: Payer: Self-pay | Admitting: Endocrinology

## 2015-11-18 ENCOUNTER — Ambulatory Visit (HOSPITAL_COMMUNITY)
Admission: EM | Admit: 2015-11-18 | Discharge: 2015-11-18 | Disposition: A | Discharge status: Home / Self Care | Payer: Medicare Other | Attending: Family Medicine | Admitting: Family Medicine

## 2015-11-18 ENCOUNTER — Other Ambulatory Visit: Payer: Self-pay | Admitting: Family Medicine

## 2015-11-18 ENCOUNTER — Encounter (HOSPITAL_COMMUNITY): Payer: Self-pay | Admitting: Nurse Practitioner

## 2015-11-18 DIAGNOSIS — J340 Abscess, furuncle and carbuncle of nose: Secondary | ICD-10-CM | POA: Diagnosis not present

## 2015-11-18 MED ORDER — CEPHALEXIN 500 MG PO CAPS: 500.0000 mg | ORAL_CAPSULE | Freq: Three times a day (TID) | ORAL | Status: DC

## 2015-11-18 NOTE — Discharge Instructions (Signed)

## 2015-11-18 NOTE — ED Notes (Signed)
He c/o 3 day history painful sore area in L nare. External left nare tender to touch. He feels the sensation that something is in his nose. He denies trouble breathing. He has had some clear nasal drainage.

## 2015-11-18 NOTE — ED Provider Notes (Signed)
CSN: [id number]     Arrival date & time 11/18/15  1527 History   First MD Initiated Contact with Patient 11/18/15 1601     Chief Complaint  Patient presents with  . Facial Pain   (Consider location/radiation/quality/duration/timing/severity/associated sxs/prior Treatment) HPI Patient states that over the last couple of days he has noted a sore tender area left nostril. No large, and no previous symptoms of this nature. No home treatment. Pain score is 4 when touched otherwise 1 Past Medical History  Diagnosis Date  . Arthritis   . Diabetes mellitus   . History of depression   . GERD (gastroesophageal reflux disease)   . CAD (coronary artery disease)     2003- cabg  . Hypertension   . Hyperlipidemia   . History of kidney stones   . COPD (chronic obstructive pulmonary disease) ([place])   . HIV (human immunodeficiency virus infection) ([place]) 1991    on meds since initial dx.   . Dermatitis 11/27/2012  . Nocturia 03/06/2013  . Epicondylitis 06/05/2013    right  . Pancreatitis 11/2013    attributed to HIV meds.   . Constipation 05/28/2014  . Pain in joint, ankle and foot 01/19/2015  . Chronic kidney disease    Past Surgical History  Procedure Laterality Date  . Coronary artery bypass graft  05/2002  . Vasectomy    . Cardiac catheterization N/A 07/29/2015    Procedure: Left Heart Cath and Coronary Angiography;  Surgeon: [name], MD;  Location: [facility];  Service: Cardiovascular;  Laterality: N/A;  . Cardiac catheterization N/A 07/29/2015    Procedure: Coronary Stent Intervention;  Surgeon: [name], MD;  Location: [facility];  Service: Cardiovascular;  Laterality: N/A;   Family History  Problem Relation Age of Onset  . Arthritis      mother/father/paternal grandparents  . Breast cancer Maternal Aunt     paternal aunt  . Lung cancer Maternal Aunt   . Hyperlipidemia Mother   . Hyperlipidemia Father   . Heart disease      parents/maternal grandparents/  2 brothers  . Stroke Paternal Grandmother   . Hypertension Father     paternal grandmother/3 brothers/1 sister  . Mental retardation Sister   . Diabetes Mother     paternal grandparents/1 brother   Social History  Substance Use Topics  . Smoking status: Never Smoker   . Smokeless tobacco: Never Used  . Alcohol Use: No    Review of Systems Tender spot in nose Allergies  Review of patient's allergies indicates no known allergies.  Home Medications   Prior to Admission medications   Medication Sig Start Date End Date Taking? Authorizing Provider  amLODipine (NORVASC) 10 MG tablet Take 1 tablet (10 mg total) by mouth at bedtime. 05/18/15   Mosie Lukes, MD  aspirin 81 MG chewable tablet Chew 81 mg by mouth daily.    Historical Provider, MD  atorvastatin (LIPITOR) 20 MG tablet Take 1 tablet (20 mg total) by mouth daily. 08/30/15   Lelon Perla, MD  b complex vitamins tablet Take 1 tablet by mouth daily. 06/14/15   Mosie Lukes, MD  cephALEXin (KEFLEX) 500 MG capsule Take 1 capsule (500 mg total) by mouth 3 (three) times daily. 11/18/15   Konrad Felix, PA  Cholecalciferol 2000 UNITS CAPS Take 1 capsule (2,000 Units total) by mouth daily. 06/14/15   Mosie Lukes, MD  clotrimazole-betamethasone (LOTRISONE) cream Apply 1 application topically 2 (two)  times daily. apply to lesion on chest wall 10/10/14   Mosie Lukes, MD  Darunavir Ethanolate (PREZISTA) 800 MG tablet Take 1 tablet (800 mg total) by mouth daily. 10/19/15   Michel Bickers, MD  EDURANT 25 MG TABS tablet TAKE 1 TABLET BY MOUTH DAILY WITH BREAKFAST 10/19/15   Michel Bickers, MD  furosemide (LASIX) 20 MG tablet Take 40 mg by mouth daily.    Historical Provider, MD  Astrid Drafts Omega-3 300 MG CAPS Take 1 capsule daily 06/14/15   Mosie Lukes, MD  lisinopril (PRINIVIL,ZESTRIL) 5 MG tablet Take 2.5 mg by mouth every other day.    Historical Provider, MD  lisinopril (PRINIVIL,ZESTRIL) 5 MG tablet TAKE 1/2 TABLET BY MOUTH DAILY  11/15/15   Mosie Lukes, MD  metolazone (ZAROXOLYN) 2.5 MG tablet TAKE 1 TABLET BY MOUTH EVERY DAY 20 MINUTES PRIOR TO LASIX 03/22/15   Mosie Lukes, MD  nitroGLYCERIN (NITROSTAT) 0.4 MG SL tablet Place 1 tablet (0.4 mg total) under the tongue every 5 (five) minutes as needed for chest pain. 07/31/15   Bhavinkumar Bhagat, PA  pantoprazole (PROTONIX) 40 MG tablet Take 1 tablet (40 mg total) by mouth daily. 07/31/15   Bhavinkumar Bhagat, PA  pioglitazone (ACTOS) 15 MG tablet Take 1 tablet (15 mg total) by mouth daily. 10/24/15   Elayne Snare, MD  potassium chloride SA (K-DUR,KLOR-CON) 20 MEQ tablet Take 1 tablet (20 mEq total) by mouth 3 (three) times daily. 08/21/15   Mosie Lukes, MD  prasugrel (EFFIENT) 10 MG TABS tablet Take 1 tablet (10 mg total) by mouth daily with supper. 07/31/15   Leanor Kail, PA  Probiotic Product (PROBIOTIC DAILY) CAPS Take 1 by mouth daily 06/14/15   Mosie Lukes, MD  repaglinide (PRANDIN) 2 MG tablet TAKE 2 TABLETS BY MOUTH BEFORE BREAKFAST, 1 TABLET BY MOUTH BEFORE LUNCH AND 2 TABLETS BY MOUTH BEFORE SUPPER 08/20/15   Elayne Snare, MD  ritonavir (NORVIR) 100 MG TABS tablet Take 1 tablet (100 mg total) by mouth daily. 10/19/15   Michel Bickers, MD  sitaGLIPtin (JANUVIA) 50 MG tablet Take 1 tablet (50 mg total) by mouth daily. 05/18/15   Mosie Lukes, MD  sodium bicarbonate 650 MG tablet Take 650 mg by mouth 3 (three) times daily.    Historical Provider, MD  TIVICAY 50 MG tablet TAKE 1 TABLET BY MOUTH DAILY 10/19/15   Michel Bickers, MD  triamcinolone cream (KENALOG) 0.1 % APP TO DRY ITCHY SKIN BID PRN 10/15/15   Historical Provider, MD  valACYclovir (VALTREX) 500 MG tablet Take 1 tablet (500 mg total) by mouth daily. 08/21/15   Michel Bickers, MD   Meds Ordered and Administered this Visit  Medications - No data to display  BP 123/61 mmHg  Pulse 84  Temp(Src) 98.6 F (37 C) (Oral)  Resp 18  SpO2 99% No data found.   Physical Exam NURSES NOTES AND VITAL SIGNS  REVIEWED. CONSTITUTIONAL: Well developed, well nourished, no acute distress HEENT: normocephalic, atraumatic, left nostril lateral aspect tender soft lesion noted. No redness  EYES: Conjunctiva normal NECK:normal ROM, supple, no adenopathy PULMONARY:No respiratory distress, normal effort MUSCULOSKELETAL: Normal ROM of all extremities,  SKIN: warm and dry without rash PSYCHIATRIC: Mood and affect, behavior are normal  ED Course  Procedures (including critical care time)  Labs Review Labs Reviewed - No data to display  Imaging Review No results found.   Visual Acuity Review  Right Eye Distance:   Left Eye Distance:   Bilateral  Distance:    Right Eye Near:   Left Eye Near:    Bilateral Near:      rx keflex  May require i&d  MDM   1. Nasal abscess     Patient is reassured that there are no issues that require transfer to higher level of care at this time or additional tests. Patient is advised to continue home symptomatic treatment. Patient is advised that if there are new or worsening symptoms to attend the emergency department, contact primary care provider, or return to UC. Instructions of care provided discharged home in stable condition.    THIS NOTE WAS GENERATED USING A VOICE RECOGNITION SOFTWARE PROGRAM. ALL REASONABLE EFFORTS  WERE MADE TO PROOFREAD THIS DOCUMENT FOR ACCURACY.  I have verbally reviewed the discharge instructions with the patient. A printed AVS was given to the patient.  All questions were answered prior to discharge.      Konrad Felix, PA 11/18/15 1800

## 2015-11-19 ENCOUNTER — Encounter (HOSPITAL_COMMUNITY)
Admission: RE | Admit: 2015-11-19 | Discharge: 2015-11-19 | Disposition: A | Discharge status: Home / Self Care | Payer: Medicare Other | Source: Ambulatory Visit | Attending: Cardiology | Admitting: Cardiology

## 2015-11-19 ENCOUNTER — Encounter: Payer: Self-pay | Admitting: Family Medicine

## 2015-11-19 DIAGNOSIS — I2119 ST elevation (STEMI) myocardial infarction involving other coronary artery of inferior wall: Secondary | ICD-10-CM | POA: Diagnosis not present

## 2015-11-21 ENCOUNTER — Encounter (HOSPITAL_COMMUNITY)
Admission: RE | Admit: 2015-11-21 | Discharge: 2015-11-21 | Disposition: A | Discharge status: Home / Self Care | Payer: Medicare Other | Source: Ambulatory Visit | Attending: Cardiology | Admitting: Cardiology

## 2015-11-21 DIAGNOSIS — I2119 ST elevation (STEMI) myocardial infarction involving other coronary artery of inferior wall: Secondary | ICD-10-CM | POA: Diagnosis not present

## 2015-11-21 NOTE — Progress Notes (Signed)
Pt graduates from cardiac rehab program on Friday with completion of 36 exercise sessions in Phase II. Pt maintained good attendance and progressed nicely during his participation in rehab as evidenced by increased MET level.   Medication list reconciled. Repeat  PHQ score- 0 .  Pt has made significant lifestyle changes and should be commended for his success. Pt feels he has achieved his goals during cardiac rehab.   Pt plans to continue exercise by walking on his own and going to the gym. Barnet Pall, RN,BSN 11/21/2015 10:43 AM

## 2015-11-23 ENCOUNTER — Encounter (HOSPITAL_COMMUNITY)
Admission: RE | Admit: 2015-11-23 | Discharge: 2015-11-23 | Disposition: A | Discharge status: Home / Self Care | Payer: Medicare Other | Source: Ambulatory Visit | Attending: Cardiology | Admitting: Cardiology

## 2015-11-23 DIAGNOSIS — I2119 ST elevation (STEMI) myocardial infarction involving other coronary artery of inferior wall: Secondary | ICD-10-CM | POA: Diagnosis not present

## 2015-11-26 ENCOUNTER — Encounter (HOSPITAL_COMMUNITY): Payer: Medicare Other

## 2015-11-28 ENCOUNTER — Encounter (HOSPITAL_COMMUNITY): Payer: Medicare Other

## 2015-11-30 ENCOUNTER — Encounter (HOSPITAL_COMMUNITY): Payer: Medicare Other

## 2015-12-03 ENCOUNTER — Other Ambulatory Visit: Payer: Self-pay

## 2015-12-03 ENCOUNTER — Encounter (HOSPITAL_COMMUNITY): Payer: Medicare Other

## 2015-12-04 ENCOUNTER — Encounter: Payer: Self-pay | Admitting: Family Medicine

## 2015-12-04 ENCOUNTER — Ambulatory Visit (INDEPENDENT_AMBULATORY_CARE_PROVIDER_SITE_OTHER): Payer: Medicare Other | Admitting: Family Medicine

## 2015-12-04 VITALS — BP 108/72 | HR 73 | Temp 97.8°F | Ht 63.0 in | Wt 130.1 lb

## 2015-12-04 DIAGNOSIS — E785 Hyperlipidemia, unspecified: Secondary | ICD-10-CM | POA: Diagnosis not present

## 2015-12-04 DIAGNOSIS — I2510 Atherosclerotic heart disease of native coronary artery without angina pectoris: Secondary | ICD-10-CM | POA: Diagnosis not present

## 2015-12-04 DIAGNOSIS — I10 Essential (primary) hypertension: Secondary | ICD-10-CM

## 2015-12-04 DIAGNOSIS — Z23 Encounter for immunization: Secondary | ICD-10-CM

## 2015-12-04 DIAGNOSIS — J340 Abscess, furuncle and carbuncle of nose: Secondary | ICD-10-CM | POA: Diagnosis not present

## 2015-12-04 HISTORY — DX: Abscess, furuncle and carbuncle of nose: J34.0

## 2015-12-04 NOTE — Progress Notes (Signed)
Pre visit review using our clinic review tool, if applicable. No additional management support is needed unless otherwise documented below in the visit note. 

## 2015-12-04 NOTE — Progress Notes (Signed)
Subjective:    Patient ID: [name], male    DOB: 04-Aug-1946, 70 y.o.   MRN: [id number]  Chief Complaint  Patient presents with  . Follow-up    HPI Patient is in today for follow up.  Patient reports he has completed the cardiac rehab and he has been doing well. He went to ER for nasal abscess antibiotics have made him feel better. No fevers, chills. He reports he feels as good as he has felt in awhile. Denies CP/palp/SOB/HA/congestion/fevers/GI or GU c/o. Taking meds as prescribed   Past Medical History  Diagnosis Date  . Arthritis   . Diabetes mellitus   . History of depression   . GERD (gastroesophageal reflux disease)   . CAD (coronary artery disease)     2003- cabg  . Hypertension   . Hyperlipidemia   . History of kidney stones   . COPD (chronic obstructive pulmonary disease) ([place])   . HIV (human immunodeficiency virus infection) ([place]) 1991    on meds since initial dx.   . Dermatitis 11/27/2012  . Nocturia 03/06/2013  . Epicondylitis 06/05/2013    right  . Pancreatitis 11/2013    attributed to HIV meds.   . Constipation 05/28/2014  . Pain in joint, ankle and foot 01/19/2015  . Chronic kidney disease   . Nasal abscess 12/04/2015    Past Surgical History  Procedure Laterality Date  . Coronary artery bypass graft  05/2002  . Vasectomy    . Cardiac catheterization N/A 07/29/2015    Procedure: Left Heart Cath and Coronary Angiography;  Surgeon: [name], MD;  Location: [facility];  Service: Cardiovascular;  Laterality: N/A;  . Cardiac catheterization N/A 07/29/2015    Procedure: Coronary Stent Intervention;  Surgeon: [name], MD;  Location: [facility];  Service: Cardiovascular;  Laterality: N/A;    Family History  Problem Relation Age of Onset  . Arthritis      mother/father/paternal grandparents  . Breast cancer Maternal Aunt     paternal aunt  . Lung cancer Maternal Aunt   . Hyperlipidemia Mother   . Hyperlipidemia Father   .  Heart disease      parents/maternal grandparents/ 2 brothers  . Stroke Paternal Grandmother   . Hypertension Father     paternal grandmother/3 brothers/1 sister  . Mental retardation Sister   . Diabetes Mother     paternal grandparents/1 brother    Social History   Social History  . Marital Status: Divorced    Spouse Name: N/A  . Number of Children: 4  . Years of Education: N/A   Occupational History  . 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    Social History Main Topics  . Smoking status: Never Smoker   . Smokeless tobacco: Never Used  . Alcohol Use: No  . Drug Use: No  . Sexual Activity: Not Currently     Comment: declined condoms   Other Topics Concern  . Not on file   Social History Narrative   Lives alone.  Supportive friends and family.  His HIV Dx is not a secret.     Outpatient Prescriptions Prior to Visit  Medication Sig Dispense Refill  . amLODipine (NORVASC) 10 MG tablet Take 1 tablet (10 mg total) by mouth at bedtime. 90 tablet 1  . aspirin 81 MG chewable tablet Chew 81 mg by mouth daily.    Marland Kitchen atorvastatin (LIPITOR) 20  MG tablet Take 1 tablet (20 mg total) by mouth daily. 90 tablet 2  . b complex vitamins tablet Take 1 tablet by mouth daily. 30 tablet 11  . cephALEXin (KEFLEX) 500 MG capsule Take 1 capsule (500 mg total) by mouth 3 (three) times daily. 21 capsule 0  . Cholecalciferol 2000 UNITS CAPS Take 1 capsule (2,000 Units total) by mouth daily. 30 each 11  . clotrimazole-betamethasone (LOTRISONE) cream Apply 1 application topically 2 (two) times daily. apply to lesion on chest wall 45 g 1  . Darunavir Ethanolate (PREZISTA) 800 MG tablet Take 1 tablet (800 mg total) by mouth daily. 30 tablet 6  . EDURANT 25 MG TABS tablet TAKE 1 TABLET BY MOUTH DAILY WITH BREAKFAST 30 tablet 3  . furosemide (LASIX) 20 MG tablet Take 40 mg by mouth daily.    Javier Docker Oil Omega-3 300 MG CAPS Take 1 capsule daily 30 capsule 11  .  lisinopril (PRINIVIL,ZESTRIL) 5 MG tablet TAKE 1/2 TABLET BY MOUTH DAILY 15 tablet 0  . metolazone (ZAROXOLYN) 2.5 MG tablet TAKE 1 TABLET BY MOUTH ONCE DAILY 20 MINUTES PRIOR TO FUROSEMIDE 30 tablet 0  . nitroGLYCERIN (NITROSTAT) 0.4 MG SL tablet Place 1 tablet (0.4 mg total) under the tongue every 5 (five) minutes as needed for chest pain. 25 tablet 12  . pantoprazole (PROTONIX) 40 MG tablet Take 1 tablet (40 mg total) by mouth daily. 30 tablet 11  . pioglitazone (ACTOS) 15 MG tablet TAKE 1 TABLET(15 MG) BY MOUTH DAILY 90 tablet 1  . potassium chloride SA (K-DUR,KLOR-CON) 20 MEQ tablet Take 1 tablet (20 mEq total) by mouth 3 (three) times daily. 90 tablet 5  . prasugrel (EFFIENT) 10 MG TABS tablet Take 1 tablet (10 mg total) by mouth daily with supper. 30 tablet 6  . Probiotic Product (PROBIOTIC DAILY) CAPS Take 1 by mouth daily 30 capsule 11  . repaglinide (PRANDIN) 2 MG tablet TAKE 2 TABLETS BY MOUTH BEFORE BREAKFAST, 1 TABLET BY MOUTH BEFORE LUNCH AND 2 TABLETS BY MOUTH BEFORE SUPPER 150 tablet 3  . ritonavir (NORVIR) 100 MG TABS tablet Take 1 tablet (100 mg total) by mouth daily. 30 tablet 6  . sitaGLIPtin (JANUVIA) 50 MG tablet Take 1 tablet (50 mg total) by mouth daily. 30 tablet 3  . sodium bicarbonate 650 MG tablet Take 650 mg by mouth 3 (three) times daily.    Marland Kitchen TIVICAY 50 MG tablet TAKE 1 TABLET BY MOUTH DAILY 30 tablet 3  . triamcinolone cream (KENALOG) 0.1 % APP TO DRY ITCHY SKIN BID PRN  2  . valACYclovir (VALTREX) 500 MG tablet Take 1 tablet (500 mg total) by mouth daily. 30 tablet 5   No facility-administered medications prior to visit.    No Known Allergies  Review of Systems  Constitutional: Negative for fever and malaise/fatigue.  HENT: Negative for congestion.   Eyes: Negative for blurred vision.  Respiratory: Negative for shortness of breath.   Cardiovascular: Negative for chest pain, palpitations and leg swelling.  Gastrointestinal: Negative for nausea, abdominal  pain and blood in stool.  Genitourinary: Negative for dysuria and frequency.  Musculoskeletal: Negative for falls.  Skin: Negative for rash.  Neurological: Negative for dizziness, loss of consciousness and headaches.  Endo/Heme/Allergies: Negative for environmental allergies.  Psychiatric/Behavioral: Negative for depression. The patient is not nervous/anxious.        Objective:    Physical Exam  Constitutional: He is oriented to person, place, and time. He appears well-developed and well-nourished. No  distress.  HENT:  Head: Normocephalic and atraumatic.  Eyes: Conjunctivae are normal.  Neck: Neck supple. No thyromegaly present.  Cardiovascular: Normal rate and regular rhythm.   Murmur (2/6 systolic) heard. Pulmonary/Chest: Effort normal and breath sounds normal. No respiratory distress. He has no wheezes.  Abdominal: Soft. Bowel sounds are normal. He exhibits no mass. There is no tenderness.  Musculoskeletal: He exhibits no edema.  Lymphadenopathy:    He has no cervical adenopathy.  Neurological: He is alert and oriented to person, place, and time.  Skin: Skin is warm and dry.  Psychiatric: He has a normal mood and affect. His behavior is normal.    BP 108/72 mmHg  Pulse 73  Temp(Src) 97.8 F (36.6 C) (Oral)  Ht 5\' 3"  (1.6 m)  Wt 130 lb 2 oz (59.024 kg)  BMI 23.06 kg/m2  SpO2 98% Wt Readings from Last 3 Encounters:  12/04/15 130 lb 2 oz (59.024 kg)  10/29/15 129 lb 8 oz (58.741 kg)  10/24/15 131 lb 9.6 oz (59.693 kg)     Lab Results  Component Value Date   WBC 7.0 10/15/2015   HGB 11.1* 10/15/2015   HCT 32.6* 10/15/2015   PLT 249 10/15/2015   GLUCOSE 185* 10/19/2015   CHOL 113* 10/15/2015   TRIG 130 10/15/2015   HDL 28* 10/15/2015   LDLDIRECT 72.0 04/27/2015   LDLCALC 59 10/15/2015   ALT 19 10/15/2015   AST 14 10/15/2015   NA 138 10/19/2015   K 3.8 10/19/2015   CL 99 10/19/2015   CREATININE 1.60* 10/19/2015   BUN 35* 10/19/2015   CO2 28 10/19/2015    TSH 2.16 04/27/2015   PSA 0.51 03/02/2013   INR 0.99 07/29/2015   HGBA1C 6.6* 10/19/2015   MICROALBUR 5.7* 01/19/2015    Lab Results  Component Value Date   TSH 2.16 04/27/2015   Lab Results  Component Value Date   WBC 7.0 10/15/2015   HGB 11.1* 10/15/2015   HCT 32.6* 10/15/2015   MCV 90.3 10/15/2015   PLT 249 10/15/2015   Lab Results  Component Value Date   NA 138 10/19/2015   K 3.8 10/19/2015   CO2 28 10/19/2015   GLUCOSE 185* 10/19/2015   BUN 35* 10/19/2015   CREATININE 1.60* 10/19/2015   BILITOT 0.4 10/15/2015   ALKPHOS 74 10/15/2015   AST 14 10/15/2015   ALT 19 10/15/2015   PROT 6.7 10/15/2015   ALBUMIN 3.9 10/15/2015   CALCIUM 10.2 10/19/2015   ANIONGAP 10 07/31/2015   GFR 46.00* 10/19/2015   Lab Results  Component Value Date   CHOL 113* 10/15/2015   Lab Results  Component Value Date   HDL 28* 10/15/2015   Lab Results  Component Value Date   LDLCALC 59 10/15/2015   Lab Results  Component Value Date   TRIG 130 10/15/2015   Lab Results  Component Value Date   CHOLHDL 4.0 10/15/2015   Lab Results  Component Value Date   HGBA1C 6.6* 10/19/2015       Assessment & Plan:   Problem List Items Addressed This Visit    Essential hypertension    Well controlled, no changes to meds. Encouraged heart healthy diet such as the DASH diet and exercise as tolerated. Given Tdap today      HLD (hyperlipidemia)    Tolerating statin, encouraged heart healthy diet, avoid trans fats, minimize simple carbs and saturated fats. Increase exercise as tolerated      Nasal abscess    Improved with antibiotics,  he will notify us if worsens.        Other Visit Diagnoses    Need for Tdap vaccination    -  Primary    Relevant Orders    Tdap vaccine greater than or equal to 7yo IM (Completed)       I am having Mr. Macfadden maintain his aspirin, sodium bicarbonate, clotrimazole-betamethasone, amLODipine, sitaGLIPtin, b complex vitamins, Cholecalciferol, Krill Oil  Omega-3, PROBIOTIC DAILY, nitroGLYCERIN, pantoprazole, prasugrel, furosemide, repaglinide, potassium chloride SA, valACYclovir, atorvastatin, TIVICAY, EDURANT, ritonavir, Darunavir Ethanolate, triamcinolone cream, lisinopril, cephALEXin, metolazone, and pioglitazone.  No orders of the defined types were placed in this encounter.     Penni Homans, MD

## 2015-12-04 NOTE — Assessment & Plan Note (Signed)
hgba1c acceptable, minimize simple carbs. Increase exercise as tolerated. Continue current meds 

## 2015-12-04 NOTE — Assessment & Plan Note (Signed)
Well controlled, no changes to meds. Encouraged heart healthy diet such as the DASH diet and exercise as tolerated. Given Tdap today

## 2015-12-04 NOTE — Assessment & Plan Note (Signed)
Improved with antibiotics, he will notify us if worsens.

## 2015-12-04 NOTE — Assessment & Plan Note (Signed)
Tolerating statin, encouraged heart healthy diet, avoid trans fats, minimize simple carbs and saturated fats. Increase exercise as tolerated 

## 2015-12-05 ENCOUNTER — Encounter (HOSPITAL_COMMUNITY): Payer: Medicare Other

## 2015-12-05 ENCOUNTER — Other Ambulatory Visit (INDEPENDENT_AMBULATORY_CARE_PROVIDER_SITE_OTHER): Payer: Medicare Other

## 2015-12-05 DIAGNOSIS — E119 Type 2 diabetes mellitus without complications: Secondary | ICD-10-CM

## 2015-12-05 LAB — BASIC METABOLIC PANEL
BUN: 39 mg/dL — ABNORMAL HIGH (ref 6–23)
CALCIUM: 9.6 mg/dL (ref 8.4–10.5)
CO2: 27 meq/L (ref 19–32)
CREATININE: 1.6 mg/dL — AB (ref 0.40–1.50)
Chloride: 100 mEq/L (ref 96–112)
GFR: 45.98 mL/min — ABNORMAL LOW (ref >60.00)
GLUCOSE: 146 mg/dL — AB (ref 70–99)
Potassium: 3.6 mEq/L (ref 3.5–5.1)
Sodium: 139 mEq/L (ref 135–145)

## 2015-12-06 LAB — FRUCTOSAMINE: FRUCTOSAMINE: 317 umol/L — AB (ref 0–285)

## 2015-12-07 ENCOUNTER — Encounter: Payer: Self-pay | Admitting: Endocrinology

## 2015-12-07 ENCOUNTER — Ambulatory Visit (INDEPENDENT_AMBULATORY_CARE_PROVIDER_SITE_OTHER): Payer: Medicare Other | Admitting: Endocrinology

## 2015-12-07 ENCOUNTER — Encounter (HOSPITAL_COMMUNITY): Payer: Medicare Other

## 2015-12-07 VITALS — BP 106/52 | HR 80 | Temp 98.7°F | Ht 63.0 in | Wt 129.0 lb

## 2015-12-07 DIAGNOSIS — E1165 Type 2 diabetes mellitus with hyperglycemia: Secondary | ICD-10-CM | POA: Diagnosis not present

## 2015-12-07 DIAGNOSIS — I2510 Atherosclerotic heart disease of native coronary artery without angina pectoris: Secondary | ICD-10-CM | POA: Diagnosis not present

## 2015-12-07 NOTE — Progress Notes (Signed)
Patient ID: [name], male   DOB: April 25, 1946, 70 y.o.   MRN: [id number]           Reason for Appointment: Follow-up for Type 2 Diabetes  Referring physician: Charlett Blake  History of Present Illness:          Date of diagnosis of type 2 diabetes mellitus: 2013        Background history:  He only mildly increased blood sugar levels at the time of diagnosis and A1c was 7.1  He had been treated initially with metformin but this was stopped subsequently when his renal function was worse  He was subsequently treated with glipizide but his blood sugars had been poorly controlled in 2016 with glipizide alone Since his A1c has been persistently over 9% he was started on Januvia 50 mg in 01/2015 At initial consultation he had a high A1c of 9.5; he was switched from glipizide to Prandin in 02/2015  Recent history:   He has had A1c below 7% with his regimen of Prandin and Januvia In 3/17 because of his high postprandial readings and high fructosamine his Januvia was switched to Actos 15 mg daily  A1c tends to be lower than expected probably because of renal insufficiency  Current blood sugar patterns and problems identified:  He has fairly stable readings fasting  Postprandial readings are still quite variable and he tends to have a relatively large breakfast in the morning mostly with carbohydrates and less protein  Blood sugars are the lowest after LUNCH  He has a few readings after supper which are again variable  He has been trying to be more active and walking on his own  He is fairly compliant with taking his Prandin before eating, 4 mg at breakfast, 2 mg at lunch and 4 mg at dinner  Dinner is at 5-6 pm  Oral hypoglycemic drugs the patient is taking are: Prandin before meals as above, Actos 15 mg daily Side effects from medications have been: None  Compliance with the medical regimen: Good, usually trying to take his Prandin before eating  Hypoglycemia: Only once after lunch      Glucose monitoring:  done usually 1-2  times a day         Glucometer:  FreeStyle     Blood Glucose readings by download:  Mean values apply above for all meters except median for One Touch  PRE-MEAL Fasting Lunch Dinner Bedtime Overall  Glucose range: 83-172       Mean/median: 137    146    POST-MEAL PC Breakfast PC Lunch PC Dinner  Glucose range: 121-220  67-253  98-250   Mean/median:       Dietician visit, most recent: 8/15 DIET:  has been trying to reduce fried foods; eating instant oatmeal in the morning               Exercise:  walking 2 miles  Weight history:   Wt Readings from Last 3 Encounters:  12/07/15 129 lb (58.514 kg)  12/04/15 130 lb 2 oz (59.024 kg)  10/29/15 129 lb 8 oz (58.741 kg)    Glycemic control:   Lab Results  Component Value Date   HGBA1C 6.6* 10/19/2015   HGBA1C 6.4 07/23/2015   HGBA1C 6.8 04/26/2015   Lab Results  Component Value Date   MICROALBUR 5.7* 01/19/2015   LDLCALC 59 10/15/2015   CREATININE 1.60* 12/05/2015   Lab on 12/05/2015  Component Date Value Ref Range Status  . Sodium 12/05/2015  139  135 - 145 mEq/L Final  . Potassium 12/05/2015 3.6  3.5 - 5.1 mEq/L Final  . Chloride 12/05/2015 100  96 - 112 mEq/L Final  . CO2 12/05/2015 27  19 - 32 mEq/L Final  . Glucose, Bld 12/05/2015 146* 70 - 99 mg/dL Final  . BUN 12/05/2015 39* 6 - 23 mg/dL Final  . Creatinine, Ser 12/05/2015 1.60* 0.40 - 1.50 mg/dL Final  . Calcium 12/05/2015 9.6  8.4 - 10.5 mg/dL Final  . GFR 12/05/2015 45.98* >60.00 mL/min Final  . Fructosamine 12/05/2015 317* 0 - 285 umol/L Final   Comment: Published reference interval for apparently healthy subjects between age 46 and 21 is 36 - 285 umol/L and in a poorly controlled diabetic population is 228 - 563 umol/L with a mean of 396 umol/L.          Medication List       This list is accurate as of: 12/07/15 11:59 PM.  Always use your most recent med list.               amLODipine 10 MG tablet   Commonly known as:  NORVASC  Take 1 tablet (10 mg total) by mouth at bedtime.     aspirin 81 MG chewable tablet  Chew 81 mg by mouth daily.     atorvastatin 20 MG tablet  Commonly known as:  LIPITOR  Take 1 tablet (20 mg total) by mouth daily.     b complex vitamins tablet  Take 1 tablet by mouth daily.     Cholecalciferol 2000 units Caps  Take 1 capsule (2,000 Units total) by mouth daily.     clotrimazole-betamethasone cream  Commonly known as:  LOTRISONE  Apply 1 application topically 2 (two) times daily. apply to lesion on chest wall     Darunavir Ethanolate 800 MG tablet  Commonly known as:  PREZISTA  Take 1 tablet (800 mg total) by mouth daily.     EDURANT 25 MG Tabs tablet  Generic drug:  rilpivirine  TAKE 1 TABLET BY MOUTH DAILY WITH BREAKFAST     furosemide 20 MG tablet  Commonly known as:  LASIX  Take 40 mg by mouth daily.     Krill Oil Omega-3 300 MG Caps  Take 1 capsule daily     lisinopril 5 MG tablet  Commonly known as:  PRINIVIL,ZESTRIL  TAKE 1/2 TABLET BY MOUTH DAILY     metolazone 2.5 MG tablet  Commonly known as:  ZAROXOLYN  TAKE 1 TABLET BY MOUTH ONCE DAILY 20 MINUTES PRIOR TO FUROSEMIDE     nitroGLYCERIN 0.4 MG SL tablet  Commonly known as:  NITROSTAT  Place 1 tablet (0.4 mg total) under the tongue every 5 (five) minutes as needed for chest pain.     pantoprazole 40 MG tablet  Commonly known as:  PROTONIX  Take 1 tablet (40 mg total) by mouth daily.     pioglitazone 15 MG tablet  Commonly known as:  ACTOS  TAKE 1 TABLET(15 MG) BY MOUTH DAILY     potassium chloride SA 20 MEQ tablet  Commonly known as:  K-DUR,KLOR-CON  Take 1 tablet (20 mEq total) by mouth 3 (three) times daily.     prasugrel 10 MG Tabs tablet  Commonly known as:  EFFIENT  Take 1 tablet (10 mg total) by mouth daily with supper.     PROBIOTIC DAILY Caps  Take 1 by mouth daily     repaglinide 2 MG tablet  Commonly  known as:  PRANDIN  TAKE 2 TABLETS BY MOUTH BEFORE  BREAKFAST, 1 TABLET BY MOUTH BEFORE LUNCH AND 2 TABLETS BY MOUTH BEFORE SUPPER     ritonavir 100 MG Tabs tablet  Commonly known as:  NORVIR  Take 1 tablet (100 mg total) by mouth daily.     sodium bicarbonate 650 MG tablet  Take 650 mg by mouth 3 (three) times daily.     TIVICAY 50 MG tablet  Generic drug:  dolutegravir  TAKE 1 TABLET BY MOUTH DAILY     triamcinolone cream 0.1 %  Commonly known as:  KENALOG  APP TO DRY ITCHY SKIN BID PRN     valACYclovir 500 MG tablet  Commonly known as:  VALTREX  Take 1 tablet (500 mg total) by mouth daily.        Allergies: No Known Allergies  Past Medical History  Diagnosis Date  . Arthritis   . Diabetes mellitus   . History of depression   . GERD (gastroesophageal reflux disease)   . CAD (coronary artery disease)     2003- cabg  . Hypertension   . Hyperlipidemia   . History of kidney stones   . COPD (chronic obstructive pulmonary disease) ([place])   . HIV (human immunodeficiency virus infection) ([place]) 1991    on meds since initial dx.   . Dermatitis 11/27/2012  . Nocturia 03/06/2013  . Epicondylitis 06/05/2013    right  . Pancreatitis 11/2013    attributed to HIV meds.   . Constipation 05/28/2014  . Pain in joint, ankle and foot 01/19/2015  . Chronic kidney disease   . Nasal abscess 12/04/2015    Past Surgical History  Procedure Laterality Date  . Coronary artery bypass graft  05/2002  . Vasectomy    . Cardiac catheterization N/A 07/29/2015    Procedure: Left Heart Cath and Coronary Angiography;  Surgeon: [name], MD;  Location: [facility];  Service: Cardiovascular;  Laterality: N/A;  . Cardiac catheterization N/A 07/29/2015    Procedure: Coronary Stent Intervention;  Surgeon: [name], MD;  Location: [facility];  Service: Cardiovascular;  Laterality: N/A;    Family History  Problem Relation Age of Onset  . Arthritis      mother/father/paternal grandparents  . Breast cancer Maternal Aunt      paternal aunt  . Lung cancer Maternal Aunt   . Hyperlipidemia Mother   . Hyperlipidemia Father   . Heart disease      parents/maternal grandparents/ 2 brothers  . Stroke Paternal Grandmother   . Hypertension Father     paternal grandmother/3 brothers/1 sister  . Mental retardation Sister   . Diabetes Mother     paternal grandparents/1 brother    Social History:  reports that he has never smoked. He has never used smokeless tobacco. He reports that he does not drink alcohol or use illicit drugs.    Review of Systems    Lipid history: On Lipitor 10 mg, followed by PCP    Lab Results  Component Value Date   CHOL 113* 10/15/2015   HDL 28* 10/15/2015   LDLCALC 59 10/15/2015   LDLDIRECT 72.0 04/27/2015   TRIG 130 10/15/2015   CHOLHDL 4.0 10/15/2015            RENAL dysfunction: Followed by nephrologist, etiology unclear but creatinine is stable  Lab Results  Component Value Date   CREATININE 1.60* 12/05/2015   BUN 39* 12/05/2015   NA 139 12/05/2015  K 3.6 12/05/2015   CL 100 12/05/2015   CO2 27 12/05/2015     Hypertension: Has been treated with various drugs in the past,  taking  amlodipine and low-dose lisinopril  Diabetic foot exam  in 7/16 shows normal monofilament sensation in the toes and plantar surfaces, no skin lesions or ulcers on the feet and practically absent pedal pulses    Physical Examination:  BP 106/52 mmHg  Pulse 80  Temp(Src) 98.7 F (37.1 C) (Oral)  Ht 5\' 3"  (1.6 m)  Wt 129 lb (58.514 kg)  BMI 22.86 kg/m2  SpO2 97%        1+  ankle edema present   ASSESSMENT:  Diabetes type 2, uncontrolled with BMI 23; has  abdominal obesity    He appears to have fair  control with taking relatively large doses of Prandin along with Actos Has not done enough readings after meals but has somewhat better readings overall He is also starting to be more active He can do better with adding more protein at breakfast  PLAN:   Continue Prandin 4 mg at  breakfast and supper and stay on 2 mg at lunch  He will try to get consistent amount of protein at breakfast and reduce amount of carbohydrates in the mornings  Advised him to check more readings after lunch and dinner to help modify her diet as needed Needs follow-up in 3 months for A1c  Patient Instructions  Less starchy foods and more protein at Breakfast  Check blood sugars on waking up  3 times a week Also check blood sugars about 2 hours after a meal and do this after different meals by rotation  Recommended blood sugar levels on waking up is 90-130 and about 2 hours after meal is 130-180  Please bring your blood sugar monitor to each visit, thank you     [facility] 12/09/2015, 10:38 AM   Note: This office note was prepared with Dragon voice recognition system technology. Any transcriptional errors that result from this process are unintentional.

## 2015-12-07 NOTE — Patient Instructions (Addendum)
Less starchy foods and more protein at Breakfast  Check blood sugars on waking up  3 times a week Also check blood sugars about 2 hours after a meal and do this after different meals by rotation  Recommended blood sugar levels on waking up is 90-130 and about 2 hours after meal is 130-180  Please bring your blood sugar monitor to each visit, thank you

## 2015-12-10 DIAGNOSIS — N189 Chronic kidney disease, unspecified: Secondary | ICD-10-CM | POA: Diagnosis not present

## 2015-12-10 DIAGNOSIS — N2581 Secondary hyperparathyroidism of renal origin: Secondary | ICD-10-CM | POA: Diagnosis not present

## 2015-12-10 DIAGNOSIS — N183 Chronic kidney disease, stage 3 (moderate): Secondary | ICD-10-CM | POA: Diagnosis not present

## 2015-12-13 ENCOUNTER — Ambulatory Visit (INDEPENDENT_AMBULATORY_CARE_PROVIDER_SITE_OTHER): Payer: Medicare Other | Admitting: Family Medicine

## 2015-12-13 ENCOUNTER — Encounter: Payer: Self-pay | Admitting: Family Medicine

## 2015-12-13 VITALS — BP 98/58 | HR 94 | Temp 98.7°F | Ht 63.0 in | Wt 128.8 lb

## 2015-12-13 DIAGNOSIS — I2510 Atherosclerotic heart disease of native coronary artery without angina pectoris: Secondary | ICD-10-CM | POA: Diagnosis not present

## 2015-12-13 DIAGNOSIS — J309 Allergic rhinitis, unspecified: Secondary | ICD-10-CM | POA: Diagnosis not present

## 2015-12-13 MED ORDER — OXYMETAZOLINE HCL 0.05 % NA SOLN: 1.0000 | Freq: Two times a day (BID) | NASAL | Status: DC

## 2015-12-13 NOTE — Patient Instructions (Signed)
Please start Afrin nasal spray twice daily for 4 days. Do not take longer than 4 days. This is available over-the-counter.  Please start Claritin and take once daily for 1 month. This also is available over-the-counter.  Nasal saline can also help, he can use this 2-3 times per day.    Follow-up with your doctor if symptoms are worsening, new symptoms arise or your symptoms persist despite treatment.

## 2015-12-13 NOTE — Progress Notes (Signed)
HPI:  [name] is a very pleasant 70 year old with an extensive past medical history listed below, here for an acute visit for " Allergies": -started: The last several days -symptoms:nasal congestion, lots of sneezing, itchy eyes, occasional cough  - but reports the cough is really not bad  -denies:fever, SOB,  sinus pain, ear pain,NVD, tooth pain, productive cough, wheezing  -has tried:  Mucinex -sick contacts/travel/risks: no reported flu, strep or tick exposure -Hx of:  Reported history of allergies - not currently taking anything for this -He saw his infectious disease specialist  recently and per review of ID notes, his HIV remains under "excellent control"  ROS: See pertinent positives and negatives per HPI.  Past Medical History  Diagnosis Date  . Arthritis   . Diabetes mellitus   . History of depression   . GERD (gastroesophageal reflux disease)   . CAD (coronary artery disease)     2003- cabg  . Hypertension   . Hyperlipidemia   . History of kidney stones   . COPD (chronic obstructive pulmonary disease) ([place])   . HIV (human immunodeficiency virus infection) ([place]) 1991    on meds since initial dx.   . Dermatitis 11/27/2012  . Nocturia 03/06/2013  . Epicondylitis 06/05/2013    right  . Pancreatitis 11/2013    attributed to HIV meds.   . Constipation 05/28/2014  . Pain in joint, ankle and foot 01/19/2015  . Chronic kidney disease   . Nasal abscess 12/04/2015    Past Surgical History  Procedure Laterality Date  . Coronary artery bypass graft  05/2002  . Vasectomy    . Cardiac catheterization N/A 07/29/2015    Procedure: Left Heart Cath and Coronary Angiography;  Surgeon: [name], MD;  Location: [facility];  Service: Cardiovascular;  Laterality: N/A;  . Cardiac catheterization N/A 07/29/2015    Procedure: Coronary Stent Intervention;  Surgeon: [name], MD;  Location: [facility];  Service: Cardiovascular;  Laterality: N/A;    Family  History  Problem Relation Age of Onset  . Arthritis      mother/father/paternal grandparents  . Breast cancer Maternal Aunt     paternal aunt  . Lung cancer Maternal Aunt   . Hyperlipidemia Mother   . Hyperlipidemia Father   . Heart disease      parents/maternal grandparents/ 2 brothers  . Stroke Paternal Grandmother   . Hypertension Father     paternal grandmother/3 brothers/1 sister  . Mental retardation Sister   . Diabetes Mother     paternal grandparents/1 brother    Social History   Social History  . Marital Status: Divorced    Spouse Name: N/A  . Number of Children: 4  . Years of Education: N/A   Occupational History  . 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    Social History Main Topics  . Smoking status: Never Smoker   . Smokeless tobacco: Never Used  . Alcohol Use: No  . Drug Use: No  . Sexual Activity: Not Currently     Comment: declined condoms   Other Topics Concern  . None   Social History Narrative   Lives alone.  Supportive friends and family.  His HIV Dx is not a secret.      Current outpatient prescriptions:  .  amLODipine (NORVASC) 10 MG tablet, Take 1 tablet (10 mg total) by mouth at bedtime., Disp: 90 tablet, Rfl: 1 .  aspirin 81 MG chewable tablet, Chew 81 mg by mouth daily., Disp: , Rfl:  .  atorvastatin (LIPITOR) 20 MG tablet, Take 1 tablet (20 mg total) by mouth daily., Disp: 90 tablet, Rfl: 2 .  b complex vitamins tablet, Take 1 tablet by mouth daily., Disp: 30 tablet, Rfl: 11 .  Cholecalciferol 2000 UNITS CAPS, Take 1 capsule (2,000 Units total) by mouth daily., Disp: 30 each, Rfl: 11 .  clotrimazole-betamethasone (LOTRISONE) cream, Apply 1 application topically 2 (two) times daily. apply to lesion on chest wall, Disp: 45 g, Rfl: 1 .  Darunavir Ethanolate (PREZISTA) 800 MG tablet, Take 1 tablet (800 mg total) by mouth daily., Disp: 30 tablet, Rfl: 6 .  EDURANT 25 MG TABS tablet, TAKE 1 TABLET BY  MOUTH DAILY WITH BREAKFAST, Disp: 30 tablet, Rfl: 3 .  furosemide (LASIX) 20 MG tablet, Take 40 mg by mouth daily., Disp: , Rfl:  .  Krill Oil Omega-3 300 MG CAPS, Take 1 capsule daily, Disp: 30 capsule, Rfl: 11 .  lisinopril (PRINIVIL,ZESTRIL) 5 MG tablet, TAKE 1/2 TABLET BY MOUTH DAILY (Patient taking differently: TAKE 1/2 TABLET BY MOUTh EVERY OTHER DAY), Disp: 15 tablet, Rfl: 0 .  metolazone (ZAROXOLYN) 2.5 MG tablet, TAKE 1 TABLET BY MOUTH ONCE DAILY 20 MINUTES PRIOR TO FUROSEMIDE, Disp: 30 tablet, Rfl: 0 .  nitroGLYCERIN (NITROSTAT) 0.4 MG SL tablet, Place 1 tablet (0.4 mg total) under the tongue every 5 (five) minutes as needed for chest pain., Disp: 25 tablet, Rfl: 12 .  pantoprazole (PROTONIX) 40 MG tablet, Take 1 tablet (40 mg total) by mouth daily., Disp: 30 tablet, Rfl: 11 .  pioglitazone (ACTOS) 15 MG tablet, TAKE 1 TABLET(15 MG) BY MOUTH DAILY, Disp: 90 tablet, Rfl: 1 .  potassium chloride SA (K-DUR,KLOR-CON) 20 MEQ tablet, Take 1 tablet (20 mEq total) by mouth 3 (three) times daily., Disp: 90 tablet, Rfl: 5 .  prasugrel (EFFIENT) 10 MG TABS tablet, Take 1 tablet (10 mg total) by mouth daily with supper., Disp: 30 tablet, Rfl: 6 .  Probiotic Product (PROBIOTIC DAILY) CAPS, Take 1 by mouth daily, Disp: 30 capsule, Rfl: 11 .  repaglinide (PRANDIN) 2 MG tablet, TAKE 2 TABLETS BY MOUTH BEFORE BREAKFAST, 1 TABLET BY MOUTH BEFORE LUNCH AND 2 TABLETS BY MOUTH BEFORE SUPPER, Disp: 150 tablet, Rfl: 3 .  ritonavir (NORVIR) 100 MG TABS tablet, Take 1 tablet (100 mg total) by mouth daily., Disp: 30 tablet, Rfl: 6 .  sodium bicarbonate 650 MG tablet, Take 650 mg by mouth 3 (three) times daily., Disp: , Rfl:  .  TIVICAY 50 MG tablet, TAKE 1 TABLET BY MOUTH DAILY, Disp: 30 tablet, Rfl: 3 .  triamcinolone cream (KENALOG) 0.1 %, APP TO DRY ITCHY SKIN BID PRN, Disp: , Rfl: 2 .  valACYclovir (VALTREX) 500 MG tablet, Take 1 tablet (500 mg total) by mouth daily., Disp: 30 tablet, Rfl: 5 .  loratadine  (CLARITIN) 10 MG tablet, Take 1 tablet (10 mg total) by mouth daily., Disp: 30 tablet, Rfl: 11 .  oxymetazoline (AFRIN NASAL SPRAY) 0.05 % nasal spray, Place 1 spray into both nostrils 2 (two) times daily., Disp: 30 mL, Rfl: 0  EXAM:  Filed Vitals:   12/13/15 0833  BP: 98/58  Pulse: 94  Temp: 98.7 F (37.1 C)    Body mass index is 22.82 kg/(m^2).  GENERAL: vitals reviewed and listed above, alert, oriented, appears well hydrated and in no acute distress  HEENT: atraumatic, conjunttiva mildly irritated - no drainage or purulent  discharge, no obvious abnormalities on inspection of external nose and ears, normal appearance of ear canals and TMs, clear nasal congestion, boggy turbinates, mild post oropharyngeal erythema with PND, no tonsillar edema or exudate, no sinus TTP  LUNGS: clear to auscultation bilaterally, no wheezes, rales or rhonchi, good air movement  CV: HRRR, no peripheral edema  MS: moves all extremities without noticeable abnormality  PSYCH: pleasant and cooperative, no obvious depression or anxiety  ASSESSMENT AND PLAN:  Discussed the following assessment and plan:  Allergic rhinitis, unspecified allergic rhinitis type  -given HPI and exam findings today, a serious infection or illness is unlikely. We discussed potential etiologies, with allergic rhinitis versus mild VURI infection being most likely, and advised supportive care and monitoring. Discussed could try short course of Afrin nasal spray, claritin and nasal saline. We discussed treatment side effects, likely course, and signs of developing a serious illness. -of course, we advised to return or notify a doctor immediately if symptoms worsen or persist or new concerns arise.    Patient Instructions  Please start Afrin nasal spray twice daily for 4 days. Do not take longer than 4 days. This is available over-the-counter.  Please start Claritin and take once daily for 1 month. This also is available  over-the-counter.  Nasal saline can also help, he can use this 2-3 times per day.    Follow-up with your doctor if symptoms are worsening, new symptoms arise or your symptoms persist despite treatment.      Colin Benton R.

## 2015-12-13 NOTE — Progress Notes (Signed)
Pre visit review using our clinic review tool, if applicable. No additional management support is needed unless otherwise documented below in the visit note. 

## 2015-12-15 ENCOUNTER — Other Ambulatory Visit: Payer: Self-pay | Admitting: Endocrinology

## 2015-12-15 ENCOUNTER — Other Ambulatory Visit: Payer: Self-pay | Admitting: Family Medicine

## 2015-12-18 ENCOUNTER — Other Ambulatory Visit: Payer: Self-pay | Admitting: Family Medicine

## 2015-12-18 DIAGNOSIS — I10 Essential (primary) hypertension: Secondary | ICD-10-CM | POA: Diagnosis not present

## 2015-12-18 DIAGNOSIS — N2581 Secondary hyperparathyroidism of renal origin: Secondary | ICD-10-CM | POA: Diagnosis not present

## 2015-12-18 DIAGNOSIS — N183 Chronic kidney disease, stage 3 (moderate): Secondary | ICD-10-CM | POA: Diagnosis not present

## 2015-12-18 DIAGNOSIS — D631 Anemia in chronic kidney disease: Secondary | ICD-10-CM | POA: Diagnosis not present

## 2016-01-14 ENCOUNTER — Other Ambulatory Visit: Payer: Self-pay | Admitting: Family Medicine

## 2016-01-17 ENCOUNTER — Other Ambulatory Visit: Payer: Self-pay | Admitting: Family Medicine

## 2016-01-20 ENCOUNTER — Other Ambulatory Visit: Payer: Self-pay | Admitting: Endocrinology

## 2016-01-30 ENCOUNTER — Ambulatory Visit (INDEPENDENT_AMBULATORY_CARE_PROVIDER_SITE_OTHER): Payer: Medicare Other | Admitting: Podiatry

## 2016-01-30 ENCOUNTER — Encounter: Payer: Self-pay | Admitting: Podiatry

## 2016-01-30 VITALS — BP 117/60 | HR 66

## 2016-01-30 DIAGNOSIS — M21969 Unspecified acquired deformity of unspecified lower leg: Secondary | ICD-10-CM | POA: Diagnosis not present

## 2016-01-30 DIAGNOSIS — E11319 Type 2 diabetes mellitus with unspecified diabetic retinopathy without macular edema: Secondary | ICD-10-CM | POA: Diagnosis not present

## 2016-01-30 DIAGNOSIS — M25569 Pain in unspecified knee: Secondary | ICD-10-CM | POA: Diagnosis not present

## 2016-01-30 NOTE — Progress Notes (Signed)
SUBJECTIVE: 70 y.o. year old male presents for diabetic foot care.  Blood sugar is under control. Still having problem with painful callus under left foot.   OBJECTIVE:  DERMATOLOGIC EXAMINATION: Nails: normal appearing nails bilaterally Positive of plantar callus under 5th MPJ left. VASCULAR EXAMINATION OF LOWER LIMBS: Pedal pulses: Not palpable on both feet. NEUROLOGIC EXAMINATION OF THE LOWER LIMBS: All epicritic and tactile sensations grossly intact.  MUSCULOSKELETAL EXAMINATION: Positive of plantar flexed 5th met left foot.  Normal range of motion on ankle and forefoot joint bilateral. Thin plantar ball fat pad.   ASSESSMENT: Plantar flexed 5th met with plantar callus. Loss of plantar fat pad. Painful feet bilateral.   PLAN: Reviewed clinical findings and available treatment options. All calluses debrided. Return in 6 months or as needed

## 2016-01-30 NOTE — Patient Instructions (Signed)
Follow up on diabetic foot. Doing well other than not palpable pedal pulses. Callus trimmed under left foot and all nails debrided. Return in 6 months or sooner if needed.

## 2016-02-06 ENCOUNTER — Encounter: Payer: Self-pay | Admitting: Cardiology

## 2016-02-12 ENCOUNTER — Ambulatory Visit (INDEPENDENT_AMBULATORY_CARE_PROVIDER_SITE_OTHER): Payer: Medicare Other | Admitting: Family Medicine

## 2016-02-12 ENCOUNTER — Encounter: Payer: Self-pay | Admitting: Family Medicine

## 2016-02-12 VITALS — BP 110/62 | HR 72 | Temp 98.3°F | Ht 63.0 in | Wt 133.1 lb

## 2016-02-12 DIAGNOSIS — E11319 Type 2 diabetes mellitus with unspecified diabetic retinopathy without macular edema: Secondary | ICD-10-CM | POA: Diagnosis not present

## 2016-02-12 DIAGNOSIS — E785 Hyperlipidemia, unspecified: Secondary | ICD-10-CM | POA: Diagnosis not present

## 2016-02-12 DIAGNOSIS — I10 Essential (primary) hypertension: Secondary | ICD-10-CM | POA: Diagnosis not present

## 2016-02-12 DIAGNOSIS — I2510 Atherosclerotic heart disease of native coronary artery without angina pectoris: Secondary | ICD-10-CM

## 2016-02-12 DIAGNOSIS — K219 Gastro-esophageal reflux disease without esophagitis: Secondary | ICD-10-CM | POA: Diagnosis not present

## 2016-02-12 NOTE — Assessment & Plan Note (Signed)
Avoid offending foods, start probiotics. Do not eat large meals in late evening and consider raising head of bed.  

## 2016-02-12 NOTE — Progress Notes (Signed)
Pre visit review using our clinic review tool, if applicable. No additional management support is needed unless otherwise documented below in the visit note. 

## 2016-02-12 NOTE — Patient Instructions (Signed)

## 2016-02-12 NOTE — Progress Notes (Signed)
Patient ID: [name], male   DOB: [birth year], 70 y.o.   MRN: [id number]   Subjective:    Patient ID: [name], male    DOB: 07-May-1946, 70 y.o.   MRN: [id number]  Chief Complaint  Patient presents with  . Follow-up    HPI Patient is in today for follow up. No recent illness or acute concerns. Feels well today. Sugars have been well controlled. No polyuria or polydipsia. Denies CP/palp/SOB/HA/congestion/fevers/GI or GU c/o. Taking meds as prescribed  Past Medical History  Diagnosis Date  . Arthritis   . Diabetes mellitus   . History of depression   . GERD (gastroesophageal reflux disease)   . CAD (coronary artery disease)     2003- cabg  . Hypertension   . Hyperlipidemia   . History of kidney stones   . COPD (chronic obstructive pulmonary disease) ([place])   . HIV (human immunodeficiency virus infection) ([place]) 1991    on meds since initial dx.   . Dermatitis 11/27/2012  . Nocturia 03/06/2013  . Epicondylitis 06/05/2013    right  . Pancreatitis 11/2013    attributed to HIV meds.   . Constipation 05/28/2014  . Pain in joint, ankle and foot 01/19/2015  . Chronic kidney disease   . Nasal abscess 12/04/2015    Past Surgical History  Procedure Laterality Date  . Coronary artery bypass graft  05/2002  . Vasectomy    . Cardiac catheterization N/A 07/29/2015    Procedure: Left Heart Cath and Coronary Angiography;  Surgeon: [name], MD;  Location: [facility];  Service: Cardiovascular;  Laterality: N/A;  . Cardiac catheterization N/A 07/29/2015    Procedure: Coronary Stent Intervention;  Surgeon: [name], MD;  Location: [facility];  Service: Cardiovascular;  Laterality: N/A;    Family History  Problem Relation Age of Onset  . Arthritis      mother/father/paternal grandparents  . Breast cancer Maternal Aunt     paternal aunt  . Lung cancer Maternal Aunt   . Hyperlipidemia Mother   . Hyperlipidemia Father   . Heart disease      parents/maternal  grandparents/ 2 brothers  . Stroke Paternal Grandmother   . Hypertension Father     paternal grandmother/3 brothers/1 sister  . Mental retardation Sister   . Diabetes Mother     paternal grandparents/1 brother    Social History   Social History  . Marital Status: Divorced    Spouse Name: N/A  . Number of Children: 4  . Years of Education: N/A   Occupational History  . 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    Social History Main Topics  . Smoking status: Never Smoker   . Smokeless tobacco: Never Used  . Alcohol Use: No  . Drug Use: No  . Sexual Activity: Not Currently     Comment: declined condoms   Other Topics Concern  . Not on file   Social History Narrative   Lives alone.  Supportive friends and family.  His HIV Dx is not a secret.     Outpatient Prescriptions Prior to Visit  Medication Sig Dispense Refill  . aspirin 81 MG chewable tablet Chew 81 mg by mouth daily.    Marland Kitchen atorvastatin (LIPITOR) 20 MG tablet Take 1 tablet (20 mg total) by mouth daily. 90 tablet 2  . b complex vitamins tablet Take 1 tablet by mouth daily. 30 tablet 11  .  Cholecalciferol 2000 UNITS CAPS Take 1 capsule (2,000 Units total) by mouth daily. 30 each 11  . clotrimazole-betamethasone (LOTRISONE) cream Apply 1 application topically 2 (two) times daily. apply to lesion on chest wall 45 g 1  . Darunavir Ethanolate (PREZISTA) 800 MG tablet Take 1 tablet (800 mg total) by mouth daily. 30 tablet 6  . furosemide (LASIX) 20 MG tablet Take 40 mg by mouth daily.    . furosemide (LASIX) 20 MG tablet TAKE 2 TABLETS BY MOUTH DAILY AS NEEDED FOR FLUID RETENTION OR SWELLING 180 tablet 5  . Krill Oil Omega-3 300 MG CAPS Take 1 capsule daily 30 capsule 11  . lisinopril (PRINIVIL,ZESTRIL) 5 MG tablet TAKE 1/2 TABLET BY MOUTH DAILY 45 tablet 0  . loratadine (CLARITIN) 10 MG tablet Take 1 tablet (10 mg total) by mouth daily. 30 tablet 11  . neomycin-polymyxin-hydrocortisone  (CORTISPORIN) otic solution INSTILL 3 DROPS IN BOTH EARS THREE TIMES DAILY 10 mL 0  . nitroGLYCERIN (NITROSTAT) 0.4 MG SL tablet Place 1 tablet (0.4 mg total) under the tongue every 5 (five) minutes as needed for chest pain. 25 tablet 12  . oxymetazoline (AFRIN NASAL SPRAY) 0.05 % nasal spray Place 1 spray into both nostrils 2 (two) times daily. 30 mL 0  . pantoprazole (PROTONIX) 40 MG tablet Take 1 tablet (40 mg total) by mouth daily. 30 tablet 11  . potassium chloride SA (K-DUR,KLOR-CON) 20 MEQ tablet Take 1 tablet (20 mEq total) by mouth 3 (three) times daily. 90 tablet 5  . Probiotic Product (PROBIOTIC DAILY) CAPS Take 1 by mouth daily 30 capsule 11  . repaglinide (PRANDIN) 2 MG tablet TAKE 2 TABLETS BY MOUTH BEFORE BREAKFAST, 1 TABLET BY MOUTH BEFORE LUNCH AND 2 TABLETS BY MOUTH BEFORE SUPPER 450 tablet 1  . ritonavir (NORVIR) 100 MG TABS tablet Take 1 tablet (100 mg total) by mouth daily. 30 tablet 6  . sodium bicarbonate 650 MG tablet Take 650 mg by mouth 3 (three) times daily.    Marland Kitchen triamcinolone cream (KENALOG) 0.1 % APP TO DRY ITCHY SKIN BID PRN  2  . valACYclovir (VALTREX) 500 MG tablet Take 1 tablet (500 mg total) by mouth daily. 30 tablet 5  . amLODipine (NORVASC) 10 MG tablet Take 1 tablet (10 mg total) by mouth at bedtime. 90 tablet 1  . EDURANT 25 MG TABS tablet TAKE 1 TABLET BY MOUTH DAILY WITH BREAKFAST 30 tablet 3  . metolazone (ZAROXOLYN) 2.5 MG tablet TAKE 1 TABLET BY MOUTH ONCE DAILY 20 MINUTES PRIOR TO FUROSEMIDE 30 tablet 0  . pioglitazone (ACTOS) 15 MG tablet TAKE 1 TABLET(15 MG) BY MOUTH DAILY 30 tablet 0  . prasugrel (EFFIENT) 10 MG TABS tablet Take 1 tablet (10 mg total) by mouth daily with supper. 30 tablet 6  . TIVICAY 50 MG tablet TAKE 1 TABLET BY MOUTH DAILY 30 tablet 3  . JANUVIA 50 MG tablet TAKE 1 TABLET(50 MG) BY MOUTH DAILY 30 tablet 6   No facility-administered medications prior to visit.    No Known Allergies  Review of Systems  Constitutional: Negative  for fever and malaise/fatigue.  HENT: Negative for congestion.   Eyes: Negative for blurred vision.  Respiratory: Negative for shortness of breath.   Cardiovascular: Negative for chest pain, palpitations and leg swelling.  Gastrointestinal: Negative for nausea, abdominal pain and blood in stool.  Genitourinary: Negative for dysuria and frequency.  Musculoskeletal: Negative for falls.  Skin: Negative for rash.  Neurological: Negative for dizziness, loss of consciousness  and headaches.  Endo/Heme/Allergies: Negative for environmental allergies.  Psychiatric/Behavioral: Negative for depression. The patient is not nervous/anxious.        Objective:    Physical Exam  Constitutional: He is oriented to person, place, and time. He appears well-developed and well-nourished. No distress.  HENT:  Head: Normocephalic and atraumatic.  Nose: Nose normal.  Eyes: Right eye exhibits no discharge. Left eye exhibits no discharge.  Neck: Normal range of motion. Neck supple.  Cardiovascular: Normal rate.   Pulmonary/Chest: Effort normal and breath sounds normal.  Abdominal: Soft. Bowel sounds are normal. There is no tenderness.  Musculoskeletal: He exhibits no edema.  Neurological: He is alert and oriented to person, place, and time.  Skin: Skin is warm and dry.  Psychiatric: He has a normal mood and affect.  Nursing note and vitals reviewed.   BP 110/62 mmHg  Pulse 72  Temp(Src) 98.3 F (36.8 C) (Oral)  Ht 5\' 3"  (1.6 m)  Wt 133 lb 2 oz (60.385 kg)  BMI 23.59 kg/m2  SpO2 97% Wt Readings from Last 3 Encounters:  02/12/16 133 lb 2 oz (60.385 kg)  12/13/15 128 lb 12.8 oz (58.423 kg)  12/07/15 129 lb (58.514 kg)     Lab Results  Component Value Date   WBC 7.0 10/15/2015   HGB 11.1* 10/15/2015   HCT 32.6* 10/15/2015   PLT 249 10/15/2015   GLUCOSE 146* 12/05/2015   CHOL 113* 10/15/2015   TRIG 130 10/15/2015   HDL 28* 10/15/2015   LDLDIRECT 72.0 04/27/2015   LDLCALC 59 10/15/2015    ALT 19 10/15/2015   AST 14 10/15/2015   NA 139 12/05/2015   K 3.6 12/05/2015   CL 100 12/05/2015   CREATININE 1.60* 12/05/2015   BUN 39* 12/05/2015   CO2 27 12/05/2015   TSH 2.16 04/27/2015   PSA 0.51 03/02/2013   INR 0.99 07/29/2015   HGBA1C 6.6* 10/19/2015   MICROALBUR 5.7* 01/19/2015    Lab Results  Component Value Date   TSH 2.16 04/27/2015   Lab Results  Component Value Date   WBC 7.0 10/15/2015   HGB 11.1* 10/15/2015   HCT 32.6* 10/15/2015   MCV 90.3 10/15/2015   PLT 249 10/15/2015   Lab Results  Component Value Date   NA 139 12/05/2015   K 3.6 12/05/2015   CO2 27 12/05/2015   GLUCOSE 146* 12/05/2015   BUN 39* 12/05/2015   CREATININE 1.60* 12/05/2015   BILITOT 0.4 10/15/2015   ALKPHOS 74 10/15/2015   AST 14 10/15/2015   ALT 19 10/15/2015   PROT 6.7 10/15/2015   ALBUMIN 3.9 10/15/2015   CALCIUM 9.6 12/05/2015   ANIONGAP 10 07/31/2015   GFR 45.98* 12/05/2015   Lab Results  Component Value Date   CHOL 113* 10/15/2015   Lab Results  Component Value Date   HDL 28* 10/15/2015   Lab Results  Component Value Date   LDLCALC 59 10/15/2015   Lab Results  Component Value Date   TRIG 130 10/15/2015   Lab Results  Component Value Date   CHOLHDL 4.0 10/15/2015   Lab Results  Component Value Date   HGBA1C 6.6* 10/19/2015       Assessment & Plan:   Problem List Items Addressed This Visit    DM (diabetes mellitus), type 2 with ophthalmic complications ([place])    minimize simple carbs. Increase exercise as tolerated.      Relevant Orders   CBC   TSH   Lipid panel   Comprehensive  metabolic panel   Essential hypertension - Primary    Well controlled, no changes to meds. Encouraged heart healthy diet such as the DASH diet and exercise as tolerated.       Relevant Orders   CBC   TSH   Lipid panel   Comprehensive metabolic panel   GERD    Avoid offending foods, start probiotics. Do not eat large meals in late evening and consider raising head  of bed.       HLD (hyperlipidemia)    Tolerating statin, encouraged heart healthy diet, avoid trans fats, minimize simple carbs and saturated fats. Increase exercise as tolerated      Relevant Orders   CBC   TSH   Lipid panel   Comprehensive metabolic panel      I have discontinued Mr. Raether's JANUVIA. I am also having him maintain his aspirin, sodium bicarbonate, clotrimazole-betamethasone, b complex vitamins, Cholecalciferol, Krill Oil Omega-3, PROBIOTIC DAILY, nitroGLYCERIN, pantoprazole, furosemide, potassium chloride SA, valACYclovir, atorvastatin, ritonavir, Darunavir Ethanolate, triamcinolone cream, loratadine, oxymetazoline, lisinopril, repaglinide, furosemide, and neomycin-polymyxin-hydrocortisone.  No orders of the defined types were placed in this encounter.     Penni Homans, MD

## 2016-02-12 NOTE — Assessment & Plan Note (Signed)
Doing well. No recent concerns. Continues current treatment

## 2016-02-12 NOTE — Assessment & Plan Note (Signed)
minimize simple carbs. Increase exercise as tolerated.  

## 2016-02-12 NOTE — Assessment & Plan Note (Signed)
Tolerating statin, encouraged heart healthy diet, avoid trans fats, minimize simple carbs and saturated fats. Increase exercise as tolerated 

## 2016-02-12 NOTE — Assessment & Plan Note (Signed)
Well controlled, no changes to meds. Encouraged heart healthy diet such as the DASH diet and exercise as tolerated.  °

## 2016-02-13 ENCOUNTER — Other Ambulatory Visit: Payer: Self-pay | Admitting: Family Medicine

## 2016-02-13 ENCOUNTER — Other Ambulatory Visit: Payer: Self-pay | Admitting: Internal Medicine

## 2016-02-13 ENCOUNTER — Encounter: Payer: Self-pay | Admitting: Family Medicine

## 2016-02-13 DIAGNOSIS — B20 Human immunodeficiency virus [HIV] disease: Secondary | ICD-10-CM

## 2016-02-13 MED ORDER — AMLODIPINE BESYLATE 10 MG PO TABS: 10.0000 mg | ORAL_TABLET | Freq: Every day | ORAL | Status: DC

## 2016-02-13 MED ORDER — METOLAZONE 2.5 MG PO TABS: ORAL_TABLET | ORAL | Status: DC

## 2016-02-13 NOTE — Telephone Encounter (Signed)
Metolazone and Amlodipine sent to [facility] on [address], 90 day supply and 1 refill. Furosemide was refilled to pharmacy on 01/17/2016 #180 and 5RF by Dr. Charlett Blake. Will inform Pt via MyChart.

## 2016-02-16 ENCOUNTER — Other Ambulatory Visit: Payer: Self-pay | Admitting: Physician Assistant

## 2016-02-17 ENCOUNTER — Other Ambulatory Visit: Payer: Self-pay | Admitting: Endocrinology

## 2016-02-25 ENCOUNTER — Other Ambulatory Visit: Payer: Self-pay | Admitting: Family Medicine

## 2016-02-25 MED ORDER — FREESTYLE LANCETS MISC: 11 refills | Status: DC

## 2016-02-27 ENCOUNTER — Encounter: Payer: Self-pay | Admitting: Cardiology

## 2016-02-27 ENCOUNTER — Telehealth: Payer: Self-pay | Admitting: Cardiology

## 2016-03-03 NOTE — Telephone Encounter (Signed)
Closed encounter °

## 2016-03-07 ENCOUNTER — Other Ambulatory Visit (INDEPENDENT_AMBULATORY_CARE_PROVIDER_SITE_OTHER): Payer: Medicare Other

## 2016-03-07 DIAGNOSIS — E1165 Type 2 diabetes mellitus with hyperglycemia: Secondary | ICD-10-CM

## 2016-03-07 LAB — HEMOGLOBIN A1C: Hgb A1c MFr Bld: 6.4 % (ref 4.6–6.5)

## 2016-03-07 LAB — BASIC METABOLIC PANEL
BUN: 36 mg/dL — AB (ref 6–23)
CHLORIDE: 98 meq/L (ref 96–112)
CO2: 27 mEq/L (ref 19–32)
CREATININE: 1.69 mg/dL — AB (ref 0.40–1.50)
Calcium: 9.7 mg/dL (ref 8.4–10.5)
GFR: 43.13 mL/min — AB (ref >60.00)
GLUCOSE: 122 mg/dL — AB (ref 70–99)
POTASSIUM: 4.2 meq/L (ref 3.5–5.1)
Sodium: 136 mEq/L (ref 135–145)

## 2016-03-08 LAB — FRUCTOSAMINE: Fructosamine: 296 umol/L — ABNORMAL HIGH (ref 0–285)

## 2016-03-11 ENCOUNTER — Ambulatory Visit (INDEPENDENT_AMBULATORY_CARE_PROVIDER_SITE_OTHER): Payer: Medicare Other | Admitting: Endocrinology

## 2016-03-11 ENCOUNTER — Encounter: Payer: Self-pay | Admitting: Endocrinology

## 2016-03-11 VITALS — BP 110/58 | HR 68 | Ht 63.0 in | Wt 132.0 lb

## 2016-03-11 DIAGNOSIS — E1165 Type 2 diabetes mellitus with hyperglycemia: Secondary | ICD-10-CM | POA: Diagnosis not present

## 2016-03-11 DIAGNOSIS — I2510 Atherosclerotic heart disease of native coronary artery without angina pectoris: Secondary | ICD-10-CM

## 2016-03-11 DIAGNOSIS — I10 Essential (primary) hypertension: Secondary | ICD-10-CM | POA: Diagnosis not present

## 2016-03-11 NOTE — Progress Notes (Signed)
Patient ID: [name], male   DOB: 09/01/1945, 70 y.o.   MRN: [id number]           Reason for Appointment: Follow-up for Type 2 Diabetes  Referring physician: Charlett Blake  History of Present Illness:          Date of diagnosis of type 2 diabetes mellitus: 2013        Background history:  [name] only mildly increased blood sugar levels at the time of diagnosis and A1c was 7.1  [name] had been treated initially with metformin but this was stopped subsequently when his renal function was worse  [name] was subsequently treated with glipizide but his blood sugars had been poorly controlled in 2016 with glipizide alone Since his A1c has been persistently over 9% [name] was started on Januvia 50 mg in 01/2015 At initial consultation [name] had a high A1c of 9.5; [name] was switched from glipizide to Prandin in 02/2015  Recent history:  Oral hypoglycemic drugs the patient is taking are: Prandin before meals 4 mg at breakfast and supper and 2 mg before lunch, Actos 15 mg daily  [name] has had A1c below 7%, now 6.4 Fructosamine is 296 which is improved from previous levels  In 3/17 because of his high postprandial readings and high fructosamine his Januvia was switched to Actos 15 mg daily  A1c tends to be lower than expected probably because of renal insufficiency  Current blood sugar patterns and problems identified:  [name] has not been checking his blood sugars very much and has only a handful of readings, mostly at lunchtime  [name] tends to have high postprandial readings and has not done these as directed previously  [name] has a history of readings going up over 200 sometimes at all meals  [name] may occasional [name] feel a little hypoglycemic in the afternoon [name] does try to walk fairly regularly in the mornings Dinner is at 5-6 pm   Side effects from medications have been: None  Compliance with the medical regimen: Good, usually trying to take his Prandin before eating  Hypoglycemia: Only once after lunch     Glucose  monitoring:  done usually 0-1  times a day         Glucometer:  FreeStyle     Blood Glucose readings by download:  Mean values apply above for all meters except median for One Touch  PRE-MEAL Fasting Lunch afternoon Bedtime Overall  Glucose range: 118 70-149 66-147  66-149  Mean/median:     109    Dietician visit, most recent: 8/15 DIET:  has been trying to reduce fried/usually not eating high fat  foods; eating instant oatmeal in the morning or eggs               Exercise:  walking 2 miles in ams  Weight history:   Wt Readings from Last 3 Encounters:  03/11/16 132 lb (59.9 kg)  02/12/16 133 lb 2 oz (60.4 kg)  12/13/15 128 lb 12.8 oz (58.4 kg)    Glycemic control:   Lab Results  Component Value Date   HGBA1C 6.4 03/07/2016   HGBA1C 6.6 (H) 10/19/2015   HGBA1C 6.4 07/23/2015   Lab Results  Component Value Date   MICROALBUR 5.7 (H) 01/19/2015   LDLCALC 59 10/15/2015   CREATININE 1.69 (H) 03/07/2016   Lab on 03/07/2016  Component Date Value Ref Range Status  . Hgb A1c MFr Bld 03/07/2016 6.4  4.6 - 6.5 % Final  . Fructosamine 03/08/2016 296*  0 - 285 umol/L Final   Comment: Published reference interval for apparently healthy subjects between age 30 and 55 is 96 - 285 umol/L and in a poorly controlled diabetic population is 228 - 563 umol/L with a mean of 396 umol/L.   Marland Kitchen Sodium 03/07/2016 136  135 - 145 mEq/L Final  . Potassium 03/07/2016 4.2  3.5 - 5.1 mEq/L Final  . Chloride 03/07/2016 98  96 - 112 mEq/L Final  . CO2 03/07/2016 27  19 - 32 mEq/L Final  . Glucose, Bld 03/07/2016 122* 70 - 99 mg/dL Final  . BUN 03/07/2016 36* 6 - 23 mg/dL Final  . Creatinine, Ser 03/07/2016 1.69* 0.40 - 1.50 mg/dL Final  . Calcium 03/07/2016 9.7  8.4 - 10.5 mg/dL Final  . GFR 03/07/2016 43.13* >60.00 mL/min Final         Medication List       Accurate as of 03/11/16  1:38 PM. Always use your most recent med list.          amLODipine 10 MG tablet Commonly known as:   NORVASC Take 1 tablet (10 mg total) by mouth at bedtime.   aspirin 81 MG chewable tablet Chew 81 mg by mouth daily.   atorvastatin 20 MG tablet Commonly known as:  LIPITOR Take 1 tablet (20 mg total) by mouth daily.   b complex vitamins tablet Take 1 tablet by mouth daily.   Cholecalciferol 2000 units Caps Take 1 capsule (2,000 Units total) by mouth daily.   clotrimazole-betamethasone cream Commonly known as:  LOTRISONE Apply 1 application topically 2 (two) times daily. apply to lesion on chest wall   Darunavir Ethanolate 800 MG tablet Commonly known as:  PREZISTA Take 1 tablet (800 mg total) by mouth daily.   EDURANT 25 MG Tabs tablet Generic drug:  rilpivirine TAKE 1 TABLET BY MOUTH DAILY WITH BREAKFAST   EFFIENT 10 MG Tabs tablet Generic drug:  prasugrel TAKE 1 TABLET BY MOUTH EVERY DAY WITH SUPPER   freestyle lancets Use as directed three times a day to check blood sugar.  DX E11.9   furosemide 20 MG tablet Commonly known as:  LASIX Take 40 mg by mouth daily.   furosemide 20 MG tablet Commonly known as:  LASIX TAKE 2 TABLETS BY MOUTH DAILY AS NEEDED FOR FLUID RETENTION OR SWELLING   Krill Oil Omega-3 300 MG Caps Take 1 capsule daily   lisinopril 5 MG tablet Commonly known as:  PRINIVIL,ZESTRIL TAKE 1/2 TABLET BY MOUTH DAILY   loratadine 10 MG tablet Commonly known as:  CLARITIN Take 1 tablet (10 mg total) by mouth daily.   metolazone 2.5 MG tablet Commonly known as:  ZAROXOLYN TAKE 1 TABLET BY MOUTH ONCE DAILY 20 MINUTES PRIOR TO FUROSEMIDE   neomycin-polymyxin-hydrocortisone otic solution Commonly known as:  CORTISPORIN INSTILL 3 DROPS IN BOTH EARS THREE TIMES DAILY   nitroGLYCERIN 0.4 MG SL tablet Commonly known as:  NITROSTAT Place 1 tablet (0.4 mg total) under the tongue every 5 (five) minutes as needed for chest pain.   oxymetazoline 0.05 % nasal spray Commonly known as:  AFRIN NASAL SPRAY Place 1 spray into both nostrils 2 (two) times  daily.   pantoprazole 40 MG tablet Commonly known as:  PROTONIX Take 1 tablet (40 mg total) by mouth daily.   pioglitazone 15 MG tablet Commonly known as:  ACTOS TAKE 1 TABLET(15 MG) BY MOUTH DAILY   potassium chloride SA 20 MEQ tablet Commonly known as:  K-DUR,KLOR-CON Take 1 tablet (  20 mEq total) by mouth 3 (three) times daily.   PROBIOTIC DAILY Caps Take 1 by mouth daily   repaglinide 2 MG tablet Commonly known as:  PRANDIN TAKE 2 TABLETS BY MOUTH BEFORE BREAKFAST, 1 TABLET BY MOUTH BEFORE LUNCH AND 2 TABLETS BY MOUTH BEFORE SUPPER   ritonavir 100 MG Tabs tablet Commonly known as:  NORVIR Take 1 tablet (100 mg total) by mouth daily.   sodium bicarbonate 650 MG tablet Take 650 mg by mouth 3 (three) times daily.   TIVICAY 50 MG tablet Generic drug:  dolutegravir TAKE 1 TABLET BY MOUTH DAILY   triamcinolone cream 0.1 % Commonly known as:  KENALOG APP TO DRY ITCHY SKIN BID PRN   valACYclovir 500 MG tablet Commonly known as:  VALTREX Take 1 tablet (500 mg total) by mouth daily.       Allergies: No Known Allergies  Past Medical History:  Diagnosis Date  . Arthritis   . CAD (coronary artery disease)    2003- cabg  . Chronic kidney disease   . Constipation 05/28/2014  . COPD (chronic obstructive pulmonary disease) ([place])   . Dermatitis 11/27/2012  . Diabetes mellitus   . Epicondylitis 06/05/2013   right  . GERD (gastroesophageal reflux disease)   . History of depression   . History of kidney stones   . HIV (human immunodeficiency virus infection) ([place]) 1991   on meds since initial dx.   . Hyperlipidemia   . Hypertension   . Nasal abscess 12/04/2015  . Nocturia 03/06/2013  . Pain in joint, ankle and foot 01/19/2015  . Pancreatitis 11/2013   attributed to HIV meds.     Past Surgical History:  Procedure Laterality Date  . CARDIAC CATHETERIZATION N/A 07/29/2015   Procedure: Left Heart Cath and Coronary Angiography;  Surgeon: [name], MD;  Location: [facility];  Service: Cardiovascular;  Laterality: N/A;  . CARDIAC CATHETERIZATION N/A 07/29/2015   Procedure: Coronary Stent Intervention;  Surgeon: [name], MD;  Location: [facility];  Service: Cardiovascular;  Laterality: N/A;  . CORONARY ARTERY BYPASS GRAFT  05/2002  . VASECTOMY      Family History  Problem Relation Age of Onset  . Arthritis      mother/father/paternal grandparents  . Breast cancer Maternal Aunt     paternal aunt  . Lung cancer Maternal Aunt   . Hyperlipidemia Mother   . Hyperlipidemia Father   . Heart disease      parents/maternal grandparents/ 2 brothers  . Stroke Paternal Grandmother   . Hypertension Father     paternal grandmother/3 brothers/1 sister  . Mental retardation Sister   . Diabetes Mother     paternal grandparents/1 brother    Social History:  reports that [name] has never smoked. [name] has never used smokeless tobacco. [name] reports that [name] does not drink alcohol or use drugs.    Review of Systems    Lipid history: On Lipitor 10 mg, followed by PCP    Lab Results  Component Value Date   CHOL 113 (L) 10/15/2015   HDL 28 (L) 10/15/2015   LDLCALC 59 10/15/2015   LDLDIRECT 72.0 04/27/2015   TRIG 130 10/15/2015   CHOLHDL 4.0 10/15/2015            RENAL dysfunction: Followed by nephrologist, etiology unclear but creatinine is stable  Lab Results  Component Value Date   CREATININE 1.69 (H) 03/07/2016   BUN 36 (H) 03/07/2016   NA 136  03/07/2016   K 4.2 03/07/2016   CL 98 03/07/2016   CO2 27 03/07/2016     Hypertension: Has been treated with various drugs in the past,  taking  amlodipine and low-dose lisinopril Blood pressure appears consistently low, still taking 10 mg amlodipine [name] is having some swelling of his ankles  BP Readings from Last 3 Encounters:  03/11/16 (!) 110/58  02/12/16 110/62  01/30/16 117/60    Diabetic foot exam  in 7/16 shows normal monofilament sensation in the toes and plantar surfaces, no  skin lesions or ulcers on the feet and practically absent pedal pulses    Physical Examination:  BP (!) 110/58   Pulse 68   Ht 5\' 3"  (1.6 m)   Wt 132 lb (59.9 kg)   SpO2 98%   BMI 23.38 kg/m     ASSESSMENT:  Diabetes type 2, uncontrolled with BMI 23; has  abdominal obesity    [name] appears to have fairly good control with taking relatively large doses of Prandin along with Actos Has not done enough readings after meals A1c is still relatively low and fructosamine is only mildly increased but better than before His exercise regimen is fairly consistent [name] says that occasionally may feel a little hypoglycemic after lunch, lowest reading 66 recently  [name] can do better with adding more protein at breakfast  HYPERTENSION and edema: His blood pressure is low normal [name] can reduce his amlodipine to half tablet until seen by PCP  PLAN:   Continue Prandin 4 mg at supper, reduce the dose to 1 tablet at breakfast and stay on 2 mg at lunch  [name] will try to get  protein at breakfast when eating oatmeal  Advised him to check more readings after lunch and dinner to help modify diet as needed Needs follow-up in 3 months for A1c  Patient Instructions  Check blood sugars on waking up  1-2X PER WEEK  Also check blood sugars about 2 hours after a meal and do this after different meals by rotation  Recommended blood sugar levels on waking up is 90-130 and about 2 hours after meal is 130-160  Please bring your blood sugar monitor to each visit, thank you  Reduce am Prandin to 1 pill in am  With oatmeal add a protein in am  Reduce amlodipine 1/2 pill daily   [name] 03/11/2016, 1:38 PM   Note: This office note was prepared with Estate agent. Any transcriptional errors that result from this process are unintentional.

## 2016-03-11 NOTE — Patient Instructions (Addendum)
Check blood sugars on waking up  1-2X PER WEEK  Also check blood sugars about 2 hours after a meal and do this after different meals by rotation  Recommended blood sugar levels on waking up is 90-130 and about 2 hours after meal is 130-160  Please bring your blood sugar monitor to each visit, thank you  Reduce am Prandin to 1 pill in am  With oatmeal add a protein in am  Reduce amlodipine 1/2 pill daily

## 2016-03-13 ENCOUNTER — Ambulatory Visit: Payer: Medicare Other

## 2016-03-14 ENCOUNTER — Other Ambulatory Visit: Payer: Self-pay | Admitting: Internal Medicine

## 2016-03-14 DIAGNOSIS — B20 Human immunodeficiency virus [HIV] disease: Secondary | ICD-10-CM

## 2016-03-17 ENCOUNTER — Other Ambulatory Visit: Payer: Self-pay | Admitting: Internal Medicine

## 2016-03-17 ENCOUNTER — Other Ambulatory Visit: Payer: Self-pay | Admitting: Family Medicine

## 2016-03-17 DIAGNOSIS — B20 Human immunodeficiency virus [HIV] disease: Secondary | ICD-10-CM

## 2016-03-17 MED ORDER — GLUCOSE BLOOD VI STRP: ORAL_STRIP | 6 refills | Status: DC

## 2016-04-01 ENCOUNTER — Other Ambulatory Visit: Payer: Medicare Other

## 2016-04-01 ENCOUNTER — Other Ambulatory Visit (HOSPITAL_COMMUNITY)
Admission: RE | Admit: 2016-04-01 | Discharge: 2016-04-01 | Disposition: A | Discharge status: Home / Self Care | Payer: Medicare Other | Source: Ambulatory Visit | Attending: Internal Medicine | Admitting: Internal Medicine

## 2016-04-01 DIAGNOSIS — Z113 Encounter for screening for infections with a predominantly sexual mode of transmission: Secondary | ICD-10-CM | POA: Insufficient documentation

## 2016-04-01 DIAGNOSIS — B20 Human immunodeficiency virus [HIV] disease: Secondary | ICD-10-CM | POA: Diagnosis not present

## 2016-04-02 LAB — HIV-1 RNA QUANT-NO REFLEX-BLD: HIV-1 RNA Quant, Log: <1.3 Log copies/mL (ref <1.30)

## 2016-04-02 LAB — T-HELPER CELL (CD4) - (RCID CLINIC ONLY)
CD4 % Helper T Cell: 43 % (ref 33–55)
CD4 T Cell Abs: 500 /uL (ref 400–2700)

## 2016-04-02 LAB — URINE CYTOLOGY ANCILLARY ONLY
Chlamydia: NEGATIVE
NEISSERIA GONORRHEA: NEGATIVE

## 2016-04-10 ENCOUNTER — Ambulatory Visit: Payer: Self-pay | Admitting: Cardiology

## 2016-04-14 ENCOUNTER — Ambulatory Visit: Payer: Self-pay | Admitting: Cardiology

## 2016-04-15 ENCOUNTER — Other Ambulatory Visit: Payer: Self-pay | Admitting: Internal Medicine

## 2016-04-15 ENCOUNTER — Encounter: Payer: Self-pay | Admitting: Internal Medicine

## 2016-04-15 ENCOUNTER — Ambulatory Visit (INDEPENDENT_AMBULATORY_CARE_PROVIDER_SITE_OTHER): Payer: Medicare Other | Admitting: Internal Medicine

## 2016-04-15 VITALS — BP 108/69 | HR 76 | Temp 97.7°F | Wt 134.0 lb

## 2016-04-15 DIAGNOSIS — Z79899 Other long term (current) drug therapy: Secondary | ICD-10-CM

## 2016-04-15 DIAGNOSIS — I2510 Atherosclerotic heart disease of native coronary artery without angina pectoris: Secondary | ICD-10-CM | POA: Diagnosis not present

## 2016-04-15 DIAGNOSIS — B20 Human immunodeficiency virus [HIV] disease: Secondary | ICD-10-CM | POA: Diagnosis not present

## 2016-04-15 NOTE — Assessment & Plan Note (Signed)
His adherence is perfect and his infection remains under excellent control with Intelence, Tivicay, Prezista and Norvir. He will follow-up in one year.

## 2016-04-15 NOTE — Progress Notes (Signed)
87          Patient Active Problem List   Diagnosis Date Noted  . DM (diabetes mellitus), type 2 with ophthalmic complications ([place]) [id number]    Priority: High  . Human immunodeficiency virus (HIV) disease ([place]) 05/12/2006    Priority: High  . Coronary atherosclerosis 05/12/2006    Priority: High  . Nasal abscess 12/04/2015  . Aortic stenosis 09/17/2015  . STEMI (ST elevation myocardial infarction) ([place]) 07/29/2015  . Subsequent ST elevation (STEMI) myocardial infarction of inferior wall ([place]) 07/29/2015  . Otitis, externa, infective 07/21/2015  . Lesion of breast 07/21/2015  . Carotid bruit 07/10/2015  . Capsulitis of foot 01/24/2015  . Metatarsal deformity 01/24/2015  . Keratoma 01/24/2015  . Pain in joint, ankle and foot 01/19/2015  . Skin lesion 10/18/2014  . Scratch 09/27/2014  . Absolute anemia 05/28/2014  . Constipation 05/28/2014  . Pedal edema 05/21/2014  . Posterior cervical lymphadenopathy 03/30/2014  . Hyperkalemia 03/23/2014  . Hyponatremia 03/23/2014  . Protein-calorie malnutrition, severe ([place]) 03/23/2014  . Diarrhea 11/20/2013  . HLD (hyperlipidemia) 11/19/2013  . Ascites 11/18/2013  . Pancreatitis 11/15/2013  . Epicondylitis 06/05/2013  . Nocturia 03/06/2013  . Inguinal adenopathy 01/11/2013  . Dermatitis 11/27/2012  . Night sweats 07/06/2012  . Erectile dysfunction 01/01/2012  . Lipodystrophy 12/20/2010  . Dry eye syndrome 12/20/2010  . BELLS PALSY 07/19/2010  . GENITAL HERPES 05/03/2009  . SHINGLES, HX OF 05/03/2009  . WEIGHT LOSS, ABNORMAL 04/03/2009  . INGUINAL LYMPHADENOPATHY, RIGHT 04/03/2009  . MEMORY LOSS 09/18/2008  . PERIPHERAL VASCULAR DISEASE 07/20/2008  . COPD 07/20/2008  . HIP PAIN, BILATERAL 07/17/2008  . KNEE PAIN, BILATERAL 07/17/2008  . HEMATOCHEZIA 04/18/2008  . NEPHROLITHIASIS, HX OF 02/22/2008  . DEPRESSION 09/03/2006  . GERD 09/03/2006  . CORONARY ARTERY BYPASS GRAFT, HX OF 09/03/2006  . HEARING LOSS,  SENSORINEURAL 05/12/2006  . Essential hypertension 05/12/2006    Patient's Medications  New Prescriptions   No medications on file  Previous Medications   AMLODIPINE (NORVASC) 10 MG TABLET    Take 1 tablet (10 mg total) by mouth at bedtime.   ASPIRIN 81 MG CHEWABLE TABLET    Chew 81 mg by mouth daily.   ATORVASTATIN (LIPITOR) 20 MG TABLET    Take 1 tablet (20 mg total) by mouth daily.   B COMPLEX VITAMINS TABLET    Take 1 tablet by mouth daily.   CHOLECALCIFEROL 2000 UNITS CAPS    Take 1 capsule (2,000 Units total) by mouth daily.   CLOTRIMAZOLE-BETAMETHASONE (LOTRISONE) CREAM    Apply 1 application topically 2 (two) times daily. apply to lesion on chest wall   DARUNAVIR ETHANOLATE (PREZISTA) 800 MG TABLET    Take 1 tablet (800 mg total) by mouth daily.   EDURANT 25 MG TABS TABLET    TAKE 1 TABLET BY MOUTH DAILY WITH BREAKFAST   EFFIENT 10 MG TABS TABLET    TAKE 1 TABLET BY MOUTH EVERY DAY WITH SUPPER   FUROSEMIDE (LASIX) 20 MG TABLET    Take 40 mg by mouth daily.   FUROSEMIDE (LASIX) 20 MG TABLET    TAKE 2 TABLETS BY MOUTH DAILY AS NEEDED FOR FLUID RETENTION OR SWELLING   FUROSEMIDE (LASIX) 20 MG TABLET    TAKE 2 TABLETS BY MOUTH DAILY AS NEEDED FOR FLUID RETENTION OR SWELLING   GLUCOSE BLOOD (FREESTYLE TEST STRIPS) TEST STRIP    Use three times daily to check blood sugar.  DX E11.9   KRILL OIL OMEGA-3 300  MG CAPS    Take 1 capsule daily   LANCETS (FREESTYLE) LANCETS    Use as directed three times a day to check blood sugar.  DX E11.9   LISINOPRIL (PRINIVIL,ZESTRIL) 5 MG TABLET    TAKE 1/2 TABLET BY MOUTH DAILY   LORATADINE (CLARITIN) 10 MG TABLET    Take 1 tablet (10 mg total) by mouth daily.   METOLAZONE (ZAROXOLYN) 2.5 MG TABLET    TAKE 1 TABLET BY MOUTH ONCE DAILY 20 MINUTES PRIOR TO FUROSEMIDE   NEOMYCIN-POLYMYXIN-HYDROCORTISONE (CORTISPORIN) OTIC SOLUTION    INSTILL 3 DROPS IN BOTH EARS THREE TIMES DAILY   NITROGLYCERIN (NITROSTAT) 0.4 MG SL TABLET    Place 1 tablet (0.4 mg total)  under the tongue every 5 (five) minutes as needed for chest pain.   OXYMETAZOLINE (AFRIN NASAL SPRAY) 0.05 % NASAL SPRAY    Place 1 spray into both nostrils 2 (two) times daily.   PANTOPRAZOLE (PROTONIX) 40 MG TABLET    Take 1 tablet (40 mg total) by mouth daily.   PIOGLITAZONE (ACTOS) 15 MG TABLET    TAKE 1 TABLET(15 MG) BY MOUTH DAILY   POTASSIUM CHLORIDE SA (K-DUR,KLOR-CON) 20 MEQ TABLET    Take 1 tablet (20 mEq total) by mouth 3 (three) times daily.   PREZISTA 800 MG TABLET    TAKE 1 TABLET(800 MG) BY MOUTH DAILY   PROBIOTIC PRODUCT (PROBIOTIC DAILY) CAPS    Take 1 by mouth daily   REPAGLINIDE (PRANDIN) 2 MG TABLET    TAKE 2 TABLETS BY MOUTH BEFORE BREAKFAST, 1 TABLET BY MOUTH BEFORE LUNCH AND 2 TABLETS BY MOUTH BEFORE SUPPER   RITONAVIR (NORVIR) 100 MG TABS TABLET    Take 1 tablet (100 mg total) by mouth daily.   SODIUM BICARBONATE 650 MG TABLET    Take 650 mg by mouth 3 (three) times daily.   TIVICAY 50 MG TABLET    TAKE 1 TABLET BY MOUTH DAILY   TRIAMCINOLONE CREAM (KENALOG) 0.1 %    APP TO DRY ITCHY SKIN BID PRN   VALACYCLOVIR (VALTREX) 500 MG TABLET    Take 1 tablet (500 mg total) by mouth daily.  Modified Medications   No medications on file  Discontinued Medications   No medications on file    Subjective: Adelard is in for his routine HIV follow-up visit. He denies missing any doses of his antiretroviral medications. He is hoping to get a new apartment here in [place]. There is a new landlord over his apartment complex in [place]. He does not spend much time there and there are several new tenants who are doing drugs. He states that he feels like an outsider although he has lived there much longer than they have.   Review of Systems: Review of Systems  Constitutional: Negative for chills, diaphoresis, fever, malaise/fatigue and weight loss.  HENT: Positive for congestion. Negative for sore throat.   Respiratory: Positive for cough. Negative for sputum production and shortness  of breath.   Cardiovascular: Negative for chest pain.  Gastrointestinal: Negative for abdominal pain, diarrhea, heartburn, nausea and vomiting.  Genitourinary: Negative for dysuria.  Musculoskeletal: Negative for joint pain and myalgias.  Skin: Negative for rash.  Neurological: Negative for dizziness and headaches.  Psychiatric/Behavioral: Negative for depression and substance abuse. The patient is not nervous/anxious.     Past Medical History:  Diagnosis Date  . Arthritis   . CAD (coronary artery disease)    2003- cabg  . Chronic kidney disease   .  Constipation 05/28/2014  . COPD (chronic obstructive pulmonary disease) ([place])   . Dermatitis 11/27/2012  . Diabetes mellitus   . Epicondylitis 06/05/2013   right  . GERD (gastroesophageal reflux disease)   . History of depression   . History of kidney stones   . HIV (human immunodeficiency virus infection) ([place]) 1991   on meds since initial dx.   . Hyperlipidemia   . Hypertension   . Nasal abscess 12/04/2015  . Nocturia 03/06/2013  . Pain in joint, ankle and foot 01/19/2015  . Pancreatitis 11/2013   attributed to HIV meds.     Social History  Substance Use Topics  . Smoking status: Never Smoker  . Smokeless tobacco: Never Used  . Alcohol use No    Family History  Problem Relation Age of Onset  . Arthritis      mother/father/paternal grandparents  . Breast cancer Maternal Aunt     paternal aunt  . Lung cancer Maternal Aunt   . Hyperlipidemia Mother   . Hyperlipidemia Father   . Heart disease      parents/maternal grandparents/ 2 brothers  . Stroke Paternal Grandmother   . Hypertension Father     paternal grandmother/3 brothers/1 sister  . Mental retardation Sister   . Diabetes Mother     paternal grandparents/1 brother    No Known Allergies  Objective:  Vitals:   04/15/16 0907  BP: 108/69  Pulse: 76  Temp: 97.7 F (36.5 C)  TempSrc: Oral  SpO2: 98%  Weight: 134 lb (60.8 kg)   Body mass index is 23.74  kg/m.  Physical Exam  Constitutional: He is oriented to person, place, and time.  He is well dressed and in good spirits.  HENT:  Mouth/Throat: No oropharyngeal exudate.  Eyes: Conjunctivae are normal.  Cardiovascular: Normal rate and regular rhythm.   No murmur heard. Pulmonary/Chest: Effort normal and breath sounds normal.  Abdominal: Soft. There is no tenderness.  Musculoskeletal: Normal range of motion. He exhibits no edema or tenderness.  Neurological: He is alert and oriented to person, place, and time.  Skin: No rash noted.  Psychiatric: Mood and affect normal.    Lab Results Lab Results  Component Value Date   WBC 7.0 10/15/2015   HGB 11.1 (L) 10/15/2015   HCT 32.6 (L) 10/15/2015   MCV 90.3 10/15/2015   PLT 249 10/15/2015    Lab Results  Component Value Date   CREATININE 1.69 (H) 03/07/2016   BUN 36 (H) 03/07/2016   NA 136 03/07/2016   K 4.2 03/07/2016   CL 98 03/07/2016   CO2 27 03/07/2016    Lab Results  Component Value Date   ALT 19 10/15/2015   AST 14 10/15/2015   ALKPHOS 74 10/15/2015   BILITOT 0.4 10/15/2015    Lab Results  Component Value Date   CHOL 113 (L) 10/15/2015   HDL 28 (L) 10/15/2015   LDLCALC 59 10/15/2015   LDLDIRECT 72.0 04/27/2015   TRIG 130 10/15/2015   CHOLHDL 4.0 10/15/2015   HIV 1 RNA Quant (copies/mL)  Date Value  04/01/2016 <20  10/15/2015 <20  04/10/2015 <20   HIV-1 RNA Viral Load (no units)  Date Value  07/14/2013 <40  04/13/2013 <40  01/18/2013 <40   CD4 (no units)  Date Value  07/14/2013 514  04/13/2013 498  01/18/2013 522   CD4 T Cell Abs (/uL)  Date Value  04/01/2016 500  10/15/2015 410  04/10/2015 400     Problem List Items Addressed This  Visit      High   Human immunodeficiency virus (HIV) disease ([place])    His adherence is perfect and his infection remains under excellent control with Intelence, Tivicay, Prezista and Norvir. He will follow-up in one year.      Relevant Orders   T-helper  cell (CD4)- ([facility] clinic only)   HIV 1 RNA quant-no reflex-bld   CBC   Comprehensive metabolic panel   Lipid panel   RPR    Other Visit Diagnoses    Encounter for long-term (current) use of medications    -  Primary   Relevant Orders   Lipid panel        Michel Bickers, MD [facility] for [place] [phone] pager   [phone] cell 04/15/2016, 9:32 AM

## 2016-04-16 ENCOUNTER — Encounter: Payer: Self-pay | Admitting: Internal Medicine

## 2016-05-05 NOTE — Progress Notes (Signed)
HPI: FU coronary artery disease. Patient is status post coronary artery bypass and graft in 2003. Carotid Dopplers December 2016 showed 1-39% bilateral stenosis. Admitted in December 2016 with an acute infarct. Cardiac catheterization revealed severe three-vessel coronary disease. Saphenous vein graft to the diagonal had an 85% lesion. LIMA to the LAD patent. Sequential saphenous vein graft to the ramus and first appears marginal normal. Sequential saphenous vein graft to the PDA and posterior lateral had a 99% stenosis in the distal graft. Patient had PCI of the saphenous vein graft to the PDA and posterior lateral. Echocardiogram December 2016 showed normal LV function, grade 2 diastolic dysfunction, mild aortic stenosis, mild mitral regurgitation and mild left atrial enlargement. Since last seen, the patient has dyspnea with more extreme activities but not with routine activities. It is relieved with rest. It is not associated with chest pain. There is no orthopnea, PND or pedal edema. There is no syncope or palpitations. There is no exertional chest pain.   Current Outpatient Prescriptions  Medication Sig Dispense Refill  . amLODipine (NORVASC) 10 MG tablet Take 1 tablet (10 mg total) by mouth at bedtime. 90 tablet 1  . aspirin 81 MG chewable tablet Chew 81 mg by mouth daily.    Marland Kitchen atorvastatin (LIPITOR) 20 MG tablet Take 1 tablet (20 mg total) by mouth daily. 90 tablet 2  . b complex vitamins tablet Take 1 tablet by mouth daily. 30 tablet 11  . Cholecalciferol 2000 UNITS CAPS Take 1 capsule (2,000 Units total) by mouth daily. 30 each 11  . clotrimazole-betamethasone (LOTRISONE) cream Apply 1 application topically 2 (two) times daily. apply to lesion on chest wall 45 g 1  . Darunavir Ethanolate (PREZISTA) 800 MG tablet Take 1 tablet (800 mg total) by mouth daily. 30 tablet 6  . EDURANT 25 MG TABS tablet TAKE 1 TABLET BY MOUTH DAILY WITH BREAKFAST 30 tablet 6  . EFFIENT 10 MG TABS tablet  TAKE 1 TABLET BY MOUTH EVERY DAY WITH SUPPER 30 tablet 6  . furosemide (LASIX) 20 MG tablet Take 40 mg by mouth daily.    . furosemide (LASIX) 20 MG tablet TAKE 2 TABLETS BY MOUTH DAILY AS NEEDED FOR FLUID RETENTION OR SWELLING 180 tablet 5  . furosemide (LASIX) 20 MG tablet TAKE 2 TABLETS BY MOUTH DAILY AS NEEDED FOR FLUID RETENTION OR SWELLING 60 tablet 0  . glucose blood (FREESTYLE TEST STRIPS) test strip Use three times daily to check blood sugar.  DX E11.9 100 each 6  . Krill Oil Omega-3 300 MG CAPS Take 1 capsule daily 30 capsule 11  . Lancets (FREESTYLE) lancets Use as directed three times a day to check blood sugar.  DX E11.9 100 each 11  . lisinopril (PRINIVIL,ZESTRIL) 5 MG tablet TAKE 1/2 TABLET BY MOUTH DAILY 45 tablet 0  . loratadine (CLARITIN) 10 MG tablet Take 1 tablet (10 mg total) by mouth daily. 30 tablet 11  . metolazone (ZAROXOLYN) 2.5 MG tablet TAKE 1 TABLET BY MOUTH ONCE DAILY 20 MINUTES PRIOR TO FUROSEMIDE 90 tablet 1  . neomycin-polymyxin-hydrocortisone (CORTISPORIN) otic solution INSTILL 3 DROPS IN BOTH EARS THREE TIMES DAILY 10 mL 0  . nitroGLYCERIN (NITROSTAT) 0.4 MG SL tablet Place 1 tablet (0.4 mg total) under the tongue every 5 (five) minutes as needed for chest pain. 25 tablet 12  . oxymetazoline (AFRIN NASAL SPRAY) 0.05 % nasal spray Place 1 spray into both nostrils 2 (two) times daily. 30 mL 0  . pantoprazole (  PROTONIX) 40 MG tablet Take 1 tablet (40 mg total) by mouth daily. 30 tablet 11  . pioglitazone (ACTOS) 15 MG tablet TAKE 1 TABLET(15 MG) BY MOUTH DAILY 30 tablet 3  . potassium chloride SA (K-DUR,KLOR-CON) 20 MEQ tablet Take 1 tablet (20 mEq total) by mouth 3 (three) times daily. 90 tablet 5  . PREZISTA 800 MG tablet TAKE 1 TABLET(800 MG) BY MOUTH DAILY 30 tablet 4  . Probiotic Product (PROBIOTIC DAILY) CAPS Take 1 by mouth daily 30 capsule 11  . repaglinide (PRANDIN) 2 MG tablet TAKE 2 TABLETS BY MOUTH BEFORE BREAKFAST, 1 TABLET BY MOUTH BEFORE LUNCH AND 2  TABLETS BY MOUTH BEFORE SUPPER 450 tablet 1  . ritonavir (NORVIR) 100 MG TABS tablet Take 1 tablet (100 mg total) by mouth daily. 30 tablet 6  . sodium bicarbonate 650 MG tablet Take 650 mg by mouth 3 (three) times daily.    Marland Kitchen TIVICAY 50 MG tablet TAKE 1 TABLET BY MOUTH DAILY 30 tablet 5  . triamcinolone cream (KENALOG) 0.1 % APP TO DRY ITCHY SKIN BID PRN  2  . valACYclovir (VALTREX) 500 MG tablet Take 1 tablet (500 mg total) by mouth daily. 30 tablet 5   No current facility-administered medications for this visit.      Past Medical History:  Diagnosis Date  . Arthritis   . CAD (coronary artery disease)    2003- cabg  . Chronic kidney disease   . Constipation 05/28/2014  . COPD (chronic obstructive pulmonary disease) ([place])   . Dermatitis 11/27/2012  . Diabetes mellitus   . Epicondylitis 06/05/2013   right  . GERD (gastroesophageal reflux disease)   . History of depression   . History of kidney stones   . HIV (human immunodeficiency virus infection) ([place]) 1991   on meds since initial dx.   . Hyperlipidemia   . Hypertension   . Nasal abscess 12/04/2015  . Nocturia 03/06/2013  . Pain in joint, ankle and foot 01/19/2015  . Pancreatitis 11/2013   attributed to HIV meds.     Past Surgical History:  Procedure Laterality Date  . CARDIAC CATHETERIZATION N/A 07/29/2015   Procedure: Left Heart Cath and Coronary Angiography;  Surgeon: [name], MD;  Location: [facility];  Service: Cardiovascular;  Laterality: N/A;  . CARDIAC CATHETERIZATION N/A 07/29/2015   Procedure: Coronary Stent Intervention;  Surgeon: [name], MD;  Location: [facility];  Service: Cardiovascular;  Laterality: N/A;  . CORONARY ARTERY BYPASS GRAFT  05/2002  . VASECTOMY      Social History   Social History  . Marital status: Divorced    Spouse name: N/A  . Number of children: 4  . Years of education: N/A   Occupational History  . 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  disabled.    Social History Main Topics  . Smoking status: Never Smoker  . Smokeless tobacco: Never Used  . Alcohol use No  . Drug use: No  . Sexual activity: Not Currently     Comment: declined condoms   Other Topics Concern  . Not on file   Social History Narrative   Lives alone.  Supportive friends and family.  His HIV Dx is not a secret.     Family History  Problem Relation Age of Onset  . Hyperlipidemia Mother   . Diabetes Mother     paternal grandparents/1 brother  . Hyperlipidemia Father   .  Hypertension Father     paternal grandmother/3 brothers/1 sister  . Arthritis      mother/father/paternal grandparents  . Breast cancer Maternal Aunt     paternal aunt  . Lung cancer Maternal Aunt   . Heart disease      parents/maternal grandparents/ 2 brothers  . Stroke Paternal Grandmother   . Mental retardation Sister     ROS: no fevers or chills, productive cough, hemoptysis, dysphasia, odynophagia, melena, hematochezia, dysuria, hematuria, rash, seizure activity, orthopnea, PND, pedal edema, claudication. Remaining systems are negative.  Physical Exam: Well-developed well-nourished in no acute distress.  Skin is warm and dry.  HEENT is normal.  Neck is supple.  Chest is clear to auscultation with normal expansion.  Cardiovascular exam is regular rate and rhythm.  Abdominal exam nontender or distended. No masses palpated. Extremities show no edema. neuro grossly intact  A/P  1 Coronary artery disease-continue aspirin and statin. Continue prasugrel until the end of December and then discontinue.  2 hypertension-blood pressure controlled. Continue present medications.  3 hyperlipidemia-continue statin.  4 aortic stenosis-he will need follow-up echocardiograms in the future.  5 erectile dysfunction-patient ask about Cialis or Viagra. I have no issue with him taking these medications and I have explained that he should avoid nitroglycerin for 24 hours  after. He needs to discuss this with primary care in terms of interactions with his HIV medications.   Kirk Ruths, MD

## 2016-05-06 ENCOUNTER — Emergency Department (HOSPITAL_BASED_OUTPATIENT_CLINIC_OR_DEPARTMENT_OTHER): Payer: Medicare Other

## 2016-05-06 ENCOUNTER — Encounter (HOSPITAL_BASED_OUTPATIENT_CLINIC_OR_DEPARTMENT_OTHER): Payer: Self-pay

## 2016-05-06 ENCOUNTER — Emergency Department (HOSPITAL_BASED_OUTPATIENT_CLINIC_OR_DEPARTMENT_OTHER)
Admission: EM | Admit: 2016-05-06 | Discharge: 2016-05-06 | Disposition: A | Discharge status: Home / Self Care | Payer: Medicare Other | Attending: Emergency Medicine | Admitting: Emergency Medicine

## 2016-05-06 DIAGNOSIS — Y9389 Activity, other specified: Secondary | ICD-10-CM | POA: Diagnosis not present

## 2016-05-06 DIAGNOSIS — M542 Cervicalgia: Secondary | ICD-10-CM | POA: Diagnosis present

## 2016-05-06 DIAGNOSIS — M7918 Myalgia, other site: Secondary | ICD-10-CM

## 2016-05-06 DIAGNOSIS — M791 Myalgia: Secondary | ICD-10-CM | POA: Diagnosis not present

## 2016-05-06 DIAGNOSIS — R079 Chest pain, unspecified: Secondary | ICD-10-CM | POA: Insufficient documentation

## 2016-05-06 DIAGNOSIS — I2510 Atherosclerotic heart disease of native coronary artery without angina pectoris: Secondary | ICD-10-CM | POA: Insufficient documentation

## 2016-05-06 DIAGNOSIS — J449 Chronic obstructive pulmonary disease, unspecified: Secondary | ICD-10-CM | POA: Diagnosis not present

## 2016-05-06 DIAGNOSIS — Z79899 Other long term (current) drug therapy: Secondary | ICD-10-CM | POA: Insufficient documentation

## 2016-05-06 DIAGNOSIS — N189 Chronic kidney disease, unspecified: Secondary | ICD-10-CM | POA: Diagnosis not present

## 2016-05-06 DIAGNOSIS — Z7982 Long term (current) use of aspirin: Secondary | ICD-10-CM | POA: Insufficient documentation

## 2016-05-06 DIAGNOSIS — Y92410 Unspecified street and highway as the place of occurrence of the external cause: Secondary | ICD-10-CM | POA: Diagnosis not present

## 2016-05-06 DIAGNOSIS — E1122 Type 2 diabetes mellitus with diabetic chronic kidney disease: Secondary | ICD-10-CM | POA: Diagnosis not present

## 2016-05-06 DIAGNOSIS — Y999 Unspecified external cause status: Secondary | ICD-10-CM | POA: Diagnosis not present

## 2016-05-06 DIAGNOSIS — Z7984 Long term (current) use of oral hypoglycemic drugs: Secondary | ICD-10-CM | POA: Diagnosis not present

## 2016-05-06 DIAGNOSIS — I1310 Hypertensive heart and chronic kidney disease without heart failure, with stage 1 through stage 4 chronic kidney disease, or unspecified chronic kidney disease: Secondary | ICD-10-CM | POA: Diagnosis not present

## 2016-05-06 MED ORDER — ACETAMINOPHEN 325 MG PO TABS
650.0000 mg | ORAL_TABLET | Freq: Once | ORAL | Status: AC
  Administered 2016-05-06: 650 mg via ORAL
  Filled 2016-05-06: qty 2
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
## 2016-05-06 NOTE — ED Notes (Signed)
Patient transported to X-ray 

## 2016-05-06 NOTE — ED Notes (Signed)
MD at bedside. 
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
## 2016-05-09 ENCOUNTER — Encounter: Payer: Self-pay | Admitting: Cardiology

## 2016-05-09 ENCOUNTER — Ambulatory Visit (INDEPENDENT_AMBULATORY_CARE_PROVIDER_SITE_OTHER): Payer: Medicare Other | Admitting: Cardiology

## 2016-05-09 VITALS — BP 104/60 | HR 68 | Ht 63.0 in | Wt 134.0 lb

## 2016-05-09 DIAGNOSIS — I2510 Atherosclerotic heart disease of native coronary artery without angina pectoris: Secondary | ICD-10-CM

## 2016-05-09 DIAGNOSIS — I10 Essential (primary) hypertension: Secondary | ICD-10-CM

## 2016-05-09 DIAGNOSIS — E784 Other hyperlipidemia: Secondary | ICD-10-CM | POA: Diagnosis not present

## 2016-05-09 DIAGNOSIS — E7849 Other hyperlipidemia: Secondary | ICD-10-CM

## 2016-05-09 NOTE — Patient Instructions (Signed)
Medication Instructions:   STOP EFFIENT THE END OF DECEMBER  Follow-Up:  Your physician wants you to follow-up in: [place] will receive a reminder letter in the mail two months in advance. If you don't receive a letter, please call our office to schedule the follow-up appointment.   If you need a refill on your cardiac medications before your next appointment, please call your pharmacy.

## 2016-05-14 ENCOUNTER — Other Ambulatory Visit: Payer: Self-pay | Admitting: Medical

## 2016-05-14 ENCOUNTER — Ambulatory Visit (INDEPENDENT_AMBULATORY_CARE_PROVIDER_SITE_OTHER): Payer: Medicare Other | Admitting: Medical

## 2016-05-14 ENCOUNTER — Other Ambulatory Visit: Payer: Self-pay | Admitting: Internal Medicine

## 2016-05-14 ENCOUNTER — Encounter: Payer: Self-pay | Admitting: Medical

## 2016-05-14 VITALS — BP 121/64 | HR 83 | Temp 98.7°F | Ht 63.0 in | Wt 132.2 lb

## 2016-05-14 DIAGNOSIS — D179 Benign lipomatous neoplasm, unspecified: Secondary | ICD-10-CM | POA: Diagnosis not present

## 2016-05-14 DIAGNOSIS — M25511 Pain in right shoulder: Secondary | ICD-10-CM | POA: Diagnosis not present

## 2016-05-14 DIAGNOSIS — I2510 Atherosclerotic heart disease of native coronary artery without angina pectoris: Secondary | ICD-10-CM

## 2016-05-14 DIAGNOSIS — Z23 Encounter for immunization: Secondary | ICD-10-CM

## 2016-05-14 DIAGNOSIS — B20 Human immunodeficiency virus [HIV] disease: Secondary | ICD-10-CM

## 2016-05-14 DIAGNOSIS — M542 Cervicalgia: Secondary | ICD-10-CM

## 2016-05-14 DIAGNOSIS — M25512 Pain in left shoulder: Secondary | ICD-10-CM

## 2016-05-14 MED ORDER — TIZANIDINE HCL 4 MG PO CAPS: ORAL_CAPSULE | ORAL | 0 refills | Status: DC

## 2016-05-14 MED ORDER — DICLOFENAC SODIUM 50 MG PO TBEC: 50.0000 mg | DELAYED_RELEASE_TABLET | Freq: Two times a day (BID) | ORAL | 0 refills | Status: DC

## 2016-05-14 MED ORDER — AZELASTINE HCL 0.1 % NA SOLN: 2.0000 | Freq: Two times a day (BID) | NASAL | 3 refills | Status: DC

## 2016-05-14 NOTE — Patient Instructions (Addendum)
For your neck and shoulder pain will get cervical spine  xray and shoulder xrays.   Will rx diclofaneac for pain. Stop any otc nsaids.  Rx zanaflex muscle relaxant to use just at night.  Will follow you closely if pain not resolving. If pain not resolving consider PT or referal to specialist. If neck pain continue with persistent tingling or if new radiating pain then may get mri.  For mild allergies rx astelin and use your claritin. If allergy symptoms let us know.  For hx of lipomas refer to surgeon.  Follow up 7-10 days or as needed
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
## 2016-05-14 NOTE — Progress Notes (Signed)
Pre visit review using our clinic tool,if applicable. No additional management support is needed unless otherwise documented below in the visit note.  

## 2016-05-15 ENCOUNTER — Encounter: Payer: Self-pay | Admitting: Family Medicine

## 2016-05-15 ENCOUNTER — Other Ambulatory Visit: Payer: Self-pay | Admitting: Family Medicine

## 2016-05-18 ENCOUNTER — Other Ambulatory Visit: Payer: Self-pay | Admitting: Medical

## 2016-05-18 ENCOUNTER — Encounter: Payer: Self-pay | Admitting: Medical

## 2016-05-19 ENCOUNTER — Other Ambulatory Visit (INDEPENDENT_AMBULATORY_CARE_PROVIDER_SITE_OTHER): Payer: Medicare Other

## 2016-05-19 DIAGNOSIS — I10 Essential (primary) hypertension: Secondary | ICD-10-CM

## 2016-05-19 DIAGNOSIS — E11319 Type 2 diabetes mellitus with unspecified diabetic retinopathy without macular edema: Secondary | ICD-10-CM | POA: Diagnosis not present

## 2016-05-19 DIAGNOSIS — E785 Hyperlipidemia, unspecified: Secondary | ICD-10-CM

## 2016-05-19 LAB — COMPREHENSIVE METABOLIC PANEL
ALK PHOS: 72 U/L (ref 39–117)
ALT: 17 U/L (ref 0–53)
AST: 14 U/L (ref 0–37)
Albumin: 4.1 g/dL (ref 3.5–5.2)
BILIRUBIN TOTAL: 0.4 mg/dL (ref 0.2–1.2)
BUN: 34 mg/dL — AB (ref 6–23)
CO2: 26 mEq/L (ref 19–32)
CREATININE: 1.43 mg/dL (ref 0.40–1.50)
Calcium: 9 mg/dL (ref 8.4–10.5)
Chloride: 100 mEq/L (ref 96–112)
GFR: 52.27 mL/min — ABNORMAL LOW (ref >60.00)
GLUCOSE: 183 mg/dL — AB (ref 70–99)
Potassium: 3.8 mEq/L (ref 3.5–5.1)
SODIUM: 137 meq/L (ref 135–145)
TOTAL PROTEIN: 7.3 g/dL (ref 6.0–8.3)

## 2016-05-19 LAB — CBC
HEMATOCRIT: 36.8 % — AB (ref 39.0–52.0)
Hemoglobin: 12.8 g/dL — ABNORMAL LOW (ref 13.0–17.0)
MCHC: 34.8 g/dL (ref 30.0–36.0)
MCV: 91 fl (ref 78.0–100.0)
PLATELETS: 200 10*3/uL (ref 150.0–400.0)
RBC: 4.05 Mil/uL — AB (ref 4.22–5.81)
RDW: 14.6 % (ref 11.5–15.5)
WBC: 5.1 10*3/uL (ref 4.0–10.5)

## 2016-05-19 LAB — LIPID PANEL
CHOL/HDL RATIO: 5
Cholesterol: 149 mg/dL (ref 0–200)
HDL: 28.8 mg/dL — ABNORMAL LOW (ref >39.00)
Triglycerides: 446 mg/dL — ABNORMAL HIGH (ref 0.0–149.0)

## 2016-05-19 LAB — TSH: TSH: 2.36 u[IU]/mL (ref 0.35–4.50)

## 2016-05-19 LAB — LDL CHOLESTEROL, DIRECT: Direct LDL: 54 mg/dL

## 2016-05-20 ENCOUNTER — Ambulatory Visit: Payer: Self-pay | Admitting: Family Medicine

## 2016-05-20 ENCOUNTER — Other Ambulatory Visit: Payer: Self-pay | Admitting: Internal Medicine

## 2016-05-20 DIAGNOSIS — B029 Zoster without complications: Secondary | ICD-10-CM

## 2016-05-21 ENCOUNTER — Other Ambulatory Visit: Payer: Self-pay | Admitting: Family Medicine

## 2016-05-21 ENCOUNTER — Other Ambulatory Visit: Payer: Self-pay | Admitting: Medical

## 2016-05-21 NOTE — Telephone Encounter (Signed)
Order Providers   Prescribing Provider Encounter Provider  Mackie Pai, PA-C Mackie Pai, PA-C  Medication Detail    Disp Refills Start End   diclofenac (VOLTAREN) 50 MG EC tablet 180 tablet 0 05/20/2016    Sig: TAKE 1 TABLET(50 MG) BY MOUTH TWICE DAILY   Notes to Pharmacy: **Patient requests 90 days supply**   E-Prescribing Status: Receipt confirmed by pharmacy (05/20/2016 9:13 AM EDT)   Pharmacy   [id number] - [place], [place]

## 2016-05-22 ENCOUNTER — Ambulatory Visit: Payer: Self-pay | Admitting: Family Medicine

## 2016-05-25 ENCOUNTER — Other Ambulatory Visit: Payer: Self-pay | Admitting: Medical

## 2016-05-27 DIAGNOSIS — N6320 Unspecified lump in the left breast, unspecified quadrant: Secondary | ICD-10-CM | POA: Diagnosis not present

## 2016-05-27 DIAGNOSIS — R221 Localized swelling, mass and lump, neck: Secondary | ICD-10-CM | POA: Diagnosis not present

## 2016-05-27 NOTE — Telephone Encounter (Signed)
I will refill pt zanaflex tablets. But if he needs further refills of meds then needs to follow up.

## 2016-05-29 ENCOUNTER — Telehealth: Payer: Self-pay | Admitting: *Deleted

## 2016-05-29 NOTE — Telephone Encounter (Signed)
Per dr Stanford Breed, okay for excision of neck mass and the okay to stop effient 12-17, continue aspirin faxed to the number provided. Successful fax transmission received.

## 2016-06-02 ENCOUNTER — Other Ambulatory Visit: Payer: Self-pay | Admitting: Medical

## 2016-06-02 ENCOUNTER — Other Ambulatory Visit: Payer: Self-pay | Admitting: Family Medicine

## 2016-06-02 ENCOUNTER — Ambulatory Visit: Payer: Self-pay | Admitting: General Surgery

## 2016-06-03 ENCOUNTER — Ambulatory Visit (INDEPENDENT_AMBULATORY_CARE_PROVIDER_SITE_OTHER): Payer: Medicare Other | Admitting: Family Medicine

## 2016-06-03 ENCOUNTER — Encounter: Payer: Self-pay | Admitting: Family Medicine

## 2016-06-03 VITALS — BP 120/72 | HR 86 | Temp 98.9°F | Ht 63.0 in | Wt 135.2 lb

## 2016-06-03 DIAGNOSIS — E11319 Type 2 diabetes mellitus with unspecified diabetic retinopathy without macular edema: Secondary | ICD-10-CM

## 2016-06-03 DIAGNOSIS — A491 Streptococcal infection, unspecified site: Secondary | ICD-10-CM

## 2016-06-03 DIAGNOSIS — K219 Gastro-esophageal reflux disease without esophagitis: Secondary | ICD-10-CM

## 2016-06-03 DIAGNOSIS — I10 Essential (primary) hypertension: Secondary | ICD-10-CM | POA: Diagnosis not present

## 2016-06-03 DIAGNOSIS — I2510 Atherosclerotic heart disease of native coronary artery without angina pectoris: Secondary | ICD-10-CM | POA: Diagnosis not present

## 2016-06-03 DIAGNOSIS — M25559 Pain in unspecified hip: Secondary | ICD-10-CM

## 2016-06-03 MED ORDER — MUPIROCIN 2 % EX OINT: 1.0000 "application " | TOPICAL_OINTMENT | Freq: Two times a day (BID) | CUTANEOUS | 0 refills | Status: DC

## 2016-06-03 NOTE — Patient Instructions (Signed)

## 2016-06-03 NOTE — Progress Notes (Signed)
Pre visit review using our clinic review tool, if applicable. No additional management support is needed unless otherwise documented below in the visit note. 

## 2016-06-06 ENCOUNTER — Other Ambulatory Visit: Payer: Self-pay | Admitting: Medical

## 2016-06-06 ENCOUNTER — Other Ambulatory Visit (INDEPENDENT_AMBULATORY_CARE_PROVIDER_SITE_OTHER): Payer: Medicare Other

## 2016-06-06 DIAGNOSIS — E1165 Type 2 diabetes mellitus with hyperglycemia: Secondary | ICD-10-CM | POA: Diagnosis not present

## 2016-06-06 LAB — BASIC METABOLIC PANEL
BUN: 32 mg/dL — AB (ref 6–23)
CALCIUM: 9.2 mg/dL (ref 8.4–10.5)
CO2: 27 mEq/L (ref 19–32)
Chloride: 97 mEq/L (ref 96–112)
Creatinine, Ser: 1.47 mg/dL (ref 0.40–1.50)
GFR: 50.62 mL/min — AB (ref >60.00)
GLUCOSE: 205 mg/dL — AB (ref 70–99)
POTASSIUM: 3.6 meq/L (ref 3.5–5.1)
Sodium: 135 mEq/L (ref 135–145)

## 2016-06-06 LAB — MICROALBUMIN / CREATININE URINE RATIO
CREATININE, U: 59.7 mg/dL
Microalb Creat Ratio: 35.5 mg/g — ABNORMAL HIGH (ref 0.0–30.0)
Microalb, Ur: 21.2 mg/dL — ABNORMAL HIGH (ref 0.0–1.9)

## 2016-06-06 LAB — HEMOGLOBIN A1C: Hgb A1c MFr Bld: 6.9 % — ABNORMAL HIGH (ref 4.6–6.5)

## 2016-06-08 DIAGNOSIS — A491 Streptococcal infection, unspecified site: Secondary | ICD-10-CM | POA: Insufficient documentation

## 2016-06-08 NOTE — Assessment & Plan Note (Signed)
hgba1c acceptable, minimize simple carbs. Increase exercise as tolerated. Continue current meds 
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
## 2016-06-08 NOTE — Assessment & Plan Note (Signed)
Well controlled, no changes to meds. Encouraged heart healthy diet such as the DASH diet and exercise as tolerated.  °
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
## 2016-06-08 NOTE — Assessment & Plan Note (Signed)
Sore in nose likely strep started on Mupirocin ointment.

## 2016-06-08 NOTE — Assessment & Plan Note (Signed)
Avoid offending foods. Do not eat large meals in late evening and consider raising head of bed.  

## 2016-06-09 ENCOUNTER — Ambulatory Visit: Payer: Medicare Other | Admitting: Podiatry

## 2016-06-09 DIAGNOSIS — N189 Chronic kidney disease, unspecified: Secondary | ICD-10-CM | POA: Diagnosis not present

## 2016-06-09 DIAGNOSIS — N183 Chronic kidney disease, stage 3 (moderate): Secondary | ICD-10-CM | POA: Diagnosis not present

## 2016-06-09 DIAGNOSIS — N2581 Secondary hyperparathyroidism of renal origin: Secondary | ICD-10-CM | POA: Diagnosis not present

## 2016-06-10 ENCOUNTER — Encounter: Payer: Self-pay | Admitting: Podiatry

## 2016-06-10 ENCOUNTER — Other Ambulatory Visit: Payer: Self-pay | Admitting: Medical

## 2016-06-10 ENCOUNTER — Ambulatory Visit (INDEPENDENT_AMBULATORY_CARE_PROVIDER_SITE_OTHER): Payer: Medicare Other | Admitting: Podiatry

## 2016-06-10 VITALS — BP 131/70 | HR 77

## 2016-06-10 DIAGNOSIS — L03032 Cellulitis of left toe: Secondary | ICD-10-CM

## 2016-06-10 DIAGNOSIS — M79672 Pain in left foot: Secondary | ICD-10-CM | POA: Diagnosis not present

## 2016-06-10 DIAGNOSIS — L600 Ingrowing nail: Secondary | ICD-10-CM | POA: Diagnosis not present

## 2016-06-10 NOTE — Patient Instructions (Signed)
Ingrown nail surgery was done. Keep the dressing clean and dry. May take Advil for pain. Return this Friday.

## 2016-06-10 NOTE — Progress Notes (Signed)
SUBJECTIVE: 70 y.o. year old male presents complaining of tender left great toe x 2 weeks. Has had the same problem in past but never lasted this long.   OBJECTIVE:  DERMATOLOGIC EXAMINATION: Ingrown nail with paronychia left great toe lateral border.  VASCULAR EXAMINATION OF LOWER LIMBS: Pedal pulses: Not palpable on both feet.  NEUROLOGIC EXAMINATION OF THE LOWER LIMBS: All epicritic and tactile sensations grossly intact.   MUSCULOSKELETAL EXAMINATION: Positive of plantar flexed 5th met left foot.   ASSESSMENT: Painful ingrown nail left great toe lateral border. Paronychia Left great toe lateral border.  PLAN: Reviewed clinical findings and available treatment options. As per discussion left great toe Excision of nail and matrix tissue done under local. Affected left great toe was anesthetized with total 48m mixture of 50/50 0.5% Marcaine plain and 1% Xylocaine plain. Affected nail border was reflected and excised with nail nipper. Corresponding matrix tissue was also excised from under the proximal lateral skin folder.Surgical site was well flushed. Steri strip applied with pressure. The wound was dressed with Amerigel ointment dressing. Patient is to keep the dressing clean and dry. Return this Friday for dressing change.

## 2016-06-11 ENCOUNTER — Telehealth: Payer: Self-pay | Admitting: Endocrinology

## 2016-06-11 ENCOUNTER — Ambulatory Visit (INDEPENDENT_AMBULATORY_CARE_PROVIDER_SITE_OTHER): Payer: Medicare Other | Admitting: Endocrinology

## 2016-06-11 ENCOUNTER — Encounter: Payer: Self-pay | Admitting: Endocrinology

## 2016-06-11 ENCOUNTER — Other Ambulatory Visit: Payer: Self-pay

## 2016-06-11 VITALS — BP 112/72 | HR 78 | Ht 63.0 in | Wt 131.0 lb

## 2016-06-11 DIAGNOSIS — I2510 Atherosclerotic heart disease of native coronary artery without angina pectoris: Secondary | ICD-10-CM | POA: Diagnosis not present

## 2016-06-11 DIAGNOSIS — I10 Essential (primary) hypertension: Secondary | ICD-10-CM | POA: Diagnosis not present

## 2016-06-11 DIAGNOSIS — E782 Mixed hyperlipidemia: Secondary | ICD-10-CM

## 2016-06-11 DIAGNOSIS — E1165 Type 2 diabetes mellitus with hyperglycemia: Secondary | ICD-10-CM

## 2016-06-11 MED ORDER — INSULIN PEN NEEDLE 31G X 5 MM MISC: 1.0000 | Freq: Two times a day (BID) | 1 refills | Status: DC

## 2016-06-11 MED ORDER — INSULIN GLARGINE 300 UNIT/ML ~~LOC~~ SOPN: 10.0000 [IU] | PEN_INJECTOR | Freq: Every day | SUBCUTANEOUS | 3 refills | Status: DC

## 2016-06-11 NOTE — Patient Instructions (Addendum)
Toujeo insulin: This insulin provides blood sugar control for up to 24 hours.   Start with 6 units at bedtime daily and increase by 2 units every 3 days until the waking up sugars are under 140. Then continue the same dose.   If blood sugar is under 90 for 2 days in a row, reduce the dose by 2 units.  Note that this insulin does not control the rise of blood sugar with meals    Check blood sugars on waking up    Also check blood sugars about 2 hours after a meal and do this after different meals by rotation  Recommended blood sugar levels on waking up is 90-130 and about 2 hours after meal is 130-160  Please bring your blood sugar monitor to each visit, thank you

## 2016-06-11 NOTE — Telephone Encounter (Signed)
Toujeo and pen needles prescribed

## 2016-06-11 NOTE — Telephone Encounter (Signed)
See message,  I could not locate the medication on his current list.  Thanks!

## 2016-06-11 NOTE — Progress Notes (Signed)
Patient ID: [name], male   DOB: [birth year], 70 y.o.   MRN: [id number]           Reason for Appointment: Follow-up for Type 2 Diabetes  Referring physician: Charlett Blake  History of Present Illness:          Date of diagnosis of type 2 diabetes mellitus: 2013        Background history:  He only mildly increased blood sugar levels at the time of diagnosis and A1c was 7.1  He had been treated initially with metformin but this was stopped subsequently when his renal function was worse  He was subsequently treated with glipizide but his blood sugars had been poorly controlled in 2016 with glipizide alone Since his A1c has been persistently over 9% he was started on Januvia 50 mg in 01/2015 At initial consultation he had a high A1c of 9.5; he was switched from glipizide to Prandin in 02/2015  Recent history:  Oral hypoglycemic drugs the patient is taking are: Prandin before meals 4 mg at breakfast and supper and 2 mg before lunch, Actos 15 mg daily  He has had A1c below 7%, now 6.4 Fructosamine is 296 which is improved from previous levels  In 3/17 because of his high postprandial readings and high fructosamine his Januvia was switched to Actos 15 mg daily  A1c tends to be lower than expected probably because of renal insufficiency  Current blood sugar patterns and problems identified:  He has not been checking his blood sugars very much again  FASTING blood sugars are mostly high: Recent range 147-202 and his lab glucose was 205  He has only one afternoon reading of 203  Previously had usually good fasting readings  He thinks he is compliant with his Prandin as before and Actos.  He has not been walking for various reasons, usually would be walking 2 miles Dinner is at 5-6 pm  Side effects from medications have been: None  Compliance with the medical regimen: Good, usually trying to take his Prandin before eating  Hypoglycemia: None  Glucose monitoring:  done usually 0-1   times a day         Glucometer:  FreeStyle     Blood Glucose readings by download: See above  AVERAGE glucose 181  Dietician visit, most recent: 8/15 DIET:  has been trying to reduce fried/usually not eating high fat  foods; eating instant oatmeal in the morning or eggs               Exercise: NOT walking    Weight history:   Wt Readings from Last 3 Encounters:  06/11/16 131 lb (59.4 kg)  06/03/16 135 lb 4 oz (61.3 kg)  05/14/16 132 lb 3.2 oz (60 kg)    Glycemic control:   Lab Results  Component Value Date   HGBA1C 6.9 (H) 06/06/2016   HGBA1C 6.4 03/07/2016   HGBA1C 6.6 (H) 10/19/2015   Lab Results  Component Value Date   MICROALBUR 21.2 (H) 06/06/2016   LDLCALC 59 10/15/2015   CREATININE 1.47 06/06/2016   Lab on 06/06/2016  Component Date Value Ref Range Status  . Hgb A1c MFr Bld 06/06/2016 6.9* 4.6 - 6.5 % Final  . Sodium 06/06/2016 135  135 - 145 mEq/L Final  . Potassium 06/06/2016 3.6  3.5 - 5.1 mEq/L Final  . Chloride 06/06/2016 97  96 - 112 mEq/L Final  . CO2 06/06/2016 27  19 - 32 mEq/L Final  . Glucose, Bld  06/06/2016 205* 70 - 99 mg/dL Final  . BUN 06/06/2016 32* 6 - 23 mg/dL Final  . Creatinine, Ser 06/06/2016 1.47  0.40 - 1.50 mg/dL Final  . Calcium 06/06/2016 9.2  8.4 - 10.5 mg/dL Final  . GFR 06/06/2016 50.62* >60.00 mL/min Final  . Microalb, Ur 06/06/2016 21.2* 0.0 - 1.9 mg/dL Final  . Creatinine,U 06/06/2016 59.7  mg/dL Final  . Microalb Creat Ratio 06/06/2016 35.5* 0.0 - 30.0 mg/g Final         Medication List       Accurate as of 06/11/16  5:12 PM. Always use your most recent med list.          amLODipine 10 MG tablet Commonly known as:  NORVASC Take 1 tablet (10 mg total) by mouth at bedtime.   aspirin 81 MG chewable tablet Chew 81 mg by mouth daily.   atorvastatin 20 MG tablet Commonly known as:  LIPITOR Take 1 tablet (20 mg total) by mouth daily.   azelastine 0.1 % nasal spray Commonly known as:  ASTELIN Place 2 sprays into  both nostrils 2 (two) times daily. Use in each nostril as directed   b complex vitamins tablet Take 1 tablet by mouth daily.   Cholecalciferol 2000 units Caps Take 1 capsule (2,000 Units total) by mouth daily.   clotrimazole-betamethasone cream Commonly known as:  LOTRISONE Apply 1 application topically 2 (two) times daily. apply to lesion on chest wall   Darunavir Ethanolate 800 MG tablet Commonly known as:  PREZISTA Take 1 tablet (800 mg total) by mouth daily.   diclofenac 50 MG EC tablet Commonly known as:  VOLTAREN TAKE 1 TABLET(50 MG) BY MOUTH TWICE DAILY   EDURANT 25 MG Tabs tablet Generic drug:  rilpivirine TAKE 1 TABLET BY MOUTH DAILY WITH BREAKFAST   EFFIENT 10 MG Tabs tablet Generic drug:  prasugrel TAKE 1 TABLET BY MOUTH EVERY DAY WITH SUPPER   freestyle lancets Use as directed three times a day to check blood sugar.  DX E11.9   furosemide 20 MG tablet Commonly known as:  LASIX TAKE 2 TABLETS BY MOUTH DAILY AS NEEDED FOR FLUID RETENTION OR SWELLING   glucose blood test strip Commonly known as:  FREESTYLE TEST STRIPS Use three times daily to check blood sugar.  DX E11.9   Krill Oil Omega-3 300 MG Caps Take 1 capsule daily   lisinopril 5 MG tablet Commonly known as:  PRINIVIL,ZESTRIL TAKE 1/2 TABLET BY MOUTH DAILY   loratadine 10 MG tablet Commonly known as:  CLARITIN Take 1 tablet (10 mg total) by mouth daily.   metolazone 2.5 MG tablet Commonly known as:  ZAROXOLYN TAKE 1 TABLET BY MOUTH ONCE DAILY 20 MINUTES PRIOR TO FUROSEMIDE   mupirocin ointment 2 % Commonly known as:  BACTROBAN Place 1 application into the nose 2 (two) times daily.   neomycin-polymyxin-hydrocortisone otic solution Commonly known as:  CORTISPORIN INSTILL 3 DROPS IN BOTH EARS THREE TIMES DAILY   nitroGLYCERIN 0.4 MG SL tablet Commonly known as:  NITROSTAT Place 1 tablet (0.4 mg total) under the tongue every 5 (five) minutes as needed for chest pain.   NORVIR 100 MG Tabs  tablet Generic drug:  ritonavir TAKE 1 TABLET(100 MG) BY MOUTH DAILY   oxymetazoline 0.05 % nasal spray Commonly known as:  AFRIN NASAL SPRAY Place 1 spray into both nostrils 2 (two) times daily.   pantoprazole 40 MG tablet Commonly known as:  PROTONIX Take 1 tablet (40 mg total) by mouth daily.  pioglitazone 15 MG tablet Commonly known as:  ACTOS TAKE 1 TABLET(15 MG) BY MOUTH DAILY   potassium chloride SA 20 MEQ tablet Commonly known as:  K-DUR,KLOR-CON TAKE 1 TABLET(20 MEQ) BY MOUTH THREE TIMES DAILY   PROBIOTIC DAILY Caps Take 1 by mouth daily   repaglinide 2 MG tablet Commonly known as:  PRANDIN TAKE 2 TABLETS BY MOUTH BEFORE BREAKFAST, 1 TABLET BY MOUTH BEFORE LUNCH AND 2 TABLETS BY MOUTH BEFORE SUPPER   sodium bicarbonate 650 MG tablet Take 650 mg by mouth 3 (three) times daily.   TIVICAY 50 MG tablet Generic drug:  dolutegravir TAKE 1 TABLET BY MOUTH DAILY   triamcinolone cream 0.1 % Commonly known as:  KENALOG APP TO DRY ITCHY SKIN BID PRN   valACYclovir 500 MG tablet Commonly known as:  VALTREX Take 1 tablet (500 mg total) by mouth daily.       Allergies: No Known Allergies  Past Medical History:  Diagnosis Date  . Arthritis   . CAD (coronary artery disease)    2003- cabg  . Chronic kidney disease   . Constipation 05/28/2014  . COPD (chronic obstructive pulmonary disease) ([place])   . Dermatitis 11/27/2012  . Diabetes mellitus   . Epicondylitis 06/05/2013   right  . GERD (gastroesophageal reflux disease)   . History of depression   . History of kidney stones   . HIV (human immunodeficiency virus infection) ([place]) 1991   on meds since initial dx.   . Hyperlipidemia   . Hypertension   . Nasal abscess 12/04/2015  . Nocturia 03/06/2013  . Pain in joint, ankle and foot 01/19/2015  . Pancreatitis 11/2013   attributed to HIV meds.     Past Surgical History:  Procedure Laterality Date  . CARDIAC CATHETERIZATION N/A 07/29/2015   Procedure: Left Heart  Cath and Coronary Angiography;  Surgeon: [name], MD;  Location: [facility];  Service: Cardiovascular;  Laterality: N/A;  . CARDIAC CATHETERIZATION N/A 07/29/2015   Procedure: Coronary Stent Intervention;  Surgeon: [name], MD;  Location: [facility];  Service: Cardiovascular;  Laterality: N/A;  . CORONARY ARTERY BYPASS GRAFT  05/2002  . VASECTOMY      Family History  Problem Relation Age of Onset  . Hyperlipidemia Mother   . Diabetes Mother     paternal grandparents/1 brother  . Hyperlipidemia Father   . Hypertension Father     paternal grandmother/3 brothers/1 sister  . Arthritis      mother/father/paternal grandparents  . Breast cancer Maternal Aunt     paternal aunt  . Lung cancer Maternal Aunt   . Heart disease      parents/maternal grandparents/ 2 brothers  . Stroke Paternal Grandmother   . Mental retardation Sister     Social History:  reports that he has never smoked. He has never used smokeless tobacco. He reports that he does not drink alcohol or use drugs.    Review of Systems    Lipid history: On Lipitor 10 mg, followed by PCP His triglycerides are much higher than usual.  He is taking some fish oil from PCP's advice   Lab Results  Component Value Date   CHOL 149 05/19/2016   HDL 28.80 (L) 05/19/2016   LDLCALC 59 10/15/2015   LDLDIRECT 54.0 05/19/2016   TRIG (H) 05/19/2016    446.0 Triglyceride is over 400; calculations on Lipids are invalid.   CHOLHDL 5 05/19/2016  RENAL dysfunction: Followed by nephrologist, etiology unclear but creatinine is stable, Upper normal again  Lab Results  Component Value Date   CREATININE 1.47 06/06/2016   BUN 32 (H) 06/06/2016   NA 135 06/06/2016   K 3.6 06/06/2016   CL 97 06/06/2016   CO2 27 06/06/2016     Hypertension: Has been treated with various drugs in the past,  taking 5 mg amlodipine and low-dose lisinopril Had edema with 10 mg amlodipine   BP Readings from Last 3  Encounters:  06/11/16 112/72  06/10/16 131/70  06/03/16 120/72    Diabetic foot exam  in 7/16 shows normal monofilament sensation in the toes and plantar surfaces, no skin lesions or ulcers on the feet and practically absent pedal pulses    Physical Examination:  BP 112/72   Pulse 78   Ht 5\' 3"  (1.6 m)   Wt 131 lb (59.4 kg)   SpO2 98%   BMI 23.21 kg/m     ASSESSMENT:  Diabetes type 2, uncontrolled with BMI 23; has  abdominal obesity   See history of present illness for detailed discussion of current diabetes management, blood sugar patterns and problems identified His A1c appears to be lower than expected for his blood sugars, now averaging 180 fasting Since he is not significantly obese he may be getting more insulin deficient He is reluctant to take metformin as he was told not to take it because of his kidney function  Recently has not been doing any walking which he normally does  HYPERTENSION  is better controlled now without edema  HIGH triglycerides: Although these may improve with better diabetes control he probably needs to be on fenofibrate, will defer to PCP  PLAN:   Start low-dose basal insulin with Toujeo 6 units He will be instructed by the nurse educator Given him flowsheet to titrate the dose by 2 units every 3-4 days to get morning sugars under 140 No change in Prandin or Actos Start walking again. Start checking blood sugars consistently at least once a day at various times We will again consider metformin on his next visit, reassured him that his renal function is adequate for this  Counseling time on subjects discussed above is over 50% of today's 25 minute visit   Patient Instructions  Toujeo insulin: This insulin provides blood sugar control for up to 24 hours.   Start with 6 units at bedtime daily and increase by 2 units every 3 days until the waking up sugars are under 140. Then continue the same dose.   If blood sugar is under 90 for 2 days  in a row, reduce the dose by 2 units.  Note that this insulin does not control the rise of blood sugar with meals    Check blood sugars on waking up    Also check blood sugars about 2 hours after a meal and do this after different meals by rotation  Recommended blood sugar levels on waking up is 90-130 and about 2 hours after meal is 130-160  Please bring your blood sugar monitor to each visit, thank you     [facility] 06/11/2016, 5:12 PM   Note: This office note was prepared with Dragon voice recognition system technology. Any transcriptional errors that result from this process are unintentional.

## 2016-06-11 NOTE — Telephone Encounter (Signed)
Please send Januvia to the [facility] on [address].

## 2016-06-12 ENCOUNTER — Other Ambulatory Visit: Payer: Self-pay | Admitting: Internal Medicine

## 2016-06-12 DIAGNOSIS — B20 Human immunodeficiency virus [HIV] disease: Secondary | ICD-10-CM

## 2016-06-13 ENCOUNTER — Ambulatory Visit (INDEPENDENT_AMBULATORY_CARE_PROVIDER_SITE_OTHER): Payer: Medicare Other | Admitting: Podiatry

## 2016-06-13 ENCOUNTER — Encounter: Payer: Self-pay | Admitting: Podiatry

## 2016-06-13 DIAGNOSIS — Z9889 Other specified postprocedural states: Secondary | ICD-10-CM

## 2016-06-13 NOTE — Patient Instructions (Signed)
Post op wound clean and healing well. Keep it covered for 2 more days before can get it wet. Return if there is increased pain or redness.

## 2016-06-13 NOTE — Progress Notes (Signed)
4 days post op left great toe nail medial border. Denies any pain or discomfort. Dressing intact. Wound is clean and dry. No drainage or erythema noted. Cleansed and redressed with Mefix tape. Patient is to keep the dressing for 2 more days before can take off and shower. Keep it covered and dry. Return if there is any redness or drainage.

## 2016-06-14 ENCOUNTER — Other Ambulatory Visit: Payer: Self-pay | Admitting: Endocrinology

## 2016-06-16 ENCOUNTER — Other Ambulatory Visit: Payer: Self-pay | Admitting: Family Medicine

## 2016-06-16 DIAGNOSIS — R221 Localized swelling, mass and lump, neck: Secondary | ICD-10-CM | POA: Diagnosis not present

## 2016-06-18 DIAGNOSIS — I10 Essential (primary) hypertension: Secondary | ICD-10-CM | POA: Diagnosis not present

## 2016-06-18 DIAGNOSIS — N183 Chronic kidney disease, stage 3 (moderate): Secondary | ICD-10-CM | POA: Diagnosis not present

## 2016-06-18 DIAGNOSIS — N2581 Secondary hyperparathyroidism of renal origin: Secondary | ICD-10-CM | POA: Diagnosis not present

## 2016-06-18 DIAGNOSIS — D631 Anemia in chronic kidney disease: Secondary | ICD-10-CM | POA: Diagnosis not present

## 2016-06-20 ENCOUNTER — Other Ambulatory Visit: Payer: Self-pay | Admitting: Medical

## 2016-06-25 ENCOUNTER — Other Ambulatory Visit: Payer: Self-pay | Admitting: Family Medicine

## 2016-06-25 NOTE — Telephone Encounter (Signed)
Refill sent per [facility] refill protocol/SLS  

## 2016-06-27 ENCOUNTER — Other Ambulatory Visit: Payer: Self-pay | Admitting: Medical

## 2016-07-01 ENCOUNTER — Other Ambulatory Visit: Payer: Self-pay | Admitting: Medical

## 2016-07-01 ENCOUNTER — Other Ambulatory Visit: Payer: Self-pay | Admitting: Otolaryngology

## 2016-07-01 DIAGNOSIS — R221 Localized swelling, mass and lump, neck: Secondary | ICD-10-CM

## 2016-07-02 ENCOUNTER — Encounter: Payer: Self-pay | Admitting: Endocrinology

## 2016-07-02 ENCOUNTER — Ambulatory Visit (INDEPENDENT_AMBULATORY_CARE_PROVIDER_SITE_OTHER): Payer: Medicare Other | Admitting: Endocrinology

## 2016-07-02 VITALS — BP 120/60 | HR 70 | Ht 63.0 in | Wt 132.0 lb

## 2016-07-02 DIAGNOSIS — I2510 Atherosclerotic heart disease of native coronary artery without angina pectoris: Secondary | ICD-10-CM

## 2016-07-02 DIAGNOSIS — E1165 Type 2 diabetes mellitus with hyperglycemia: Secondary | ICD-10-CM | POA: Diagnosis not present

## 2016-07-02 NOTE — Patient Instructions (Signed)
Stop Lunch time Prandin

## 2016-07-02 NOTE — Progress Notes (Signed)
Patient ID: [name], male   DOB: 10-24-1945, 70 y.o.   MRN: [id number]           Reason for Appointment: Follow-up for Type 2 Diabetes  Referring physician: Charlett Blake  History of Present Illness:          Date of diagnosis of type 2 diabetes mellitus: 2013        Background history:  He only mildly increased blood sugar levels at the time of diagnosis and A1c was 7.1  He had been treated initially with metformin but this was stopped subsequently when his renal function was worse  He was subsequently treated with glipizide but his blood sugars had been poorly controlled in 2016 with glipizide alone Since his A1c has been persistently over 9% he was started on Januvia 50 mg in 01/2015 At initial consultation he had a high A1c of 9.5; he was switched from glipizide to Prandin in 02/2015  Recent history:   INSULIN regimen: Toujeo 6 units daily  Oral hypoglycemic drugs the patient is taking are: Prandin before meals 4 mg at breakfast and supper and 2 mg before lunch, Actos 15 mg daily  Because of persistently high fasting readings he was started on Toujeo on 06/11/16  He has had A1c below 7%, last 6.9 A1c tends to be lower than expected probably because of renal insufficiency  Current blood sugar patterns and problems identified:  He has had no difficulty doing the injection with the [place] pen  Although he was given a flowsheet to adjust the dose and get his morning sugars consistently under 130 as not made any changes  He thinks he had higher readings when he was having a respiratory infection earlier this month  Most recent fasting blood sugars in the last 1 week or so have been significantly better with the highest reading 146 today  He has done some random blood sugars in the evenings but treatment is not after supper  Suppertime readings overall are variable but more recently as low as 70 and he thinks he feels a little shaky then.  Although he has not done his usual  walking he is starting to do some more activities  He thinks he is compliant with his Prandin as before and Actos; may possibly miss lunchtime dose occasionally  His doses were not changed when starting insulin.  He has not been walking for various reasons, usually would be walking 2 miles Dinner is at 5-6 pm  Side effects from medications have been: None  Compliance with the medical regimen: Good, usually trying to take his Prandin before eating   Glucose monitoring:  done usually 0-1  times a day         Glucometer:  FreeStyle     Blood Glucose readings by download:  Mean values apply above for all meters except median for One Touch  PRE-MEAL Fasting Lunch Dinner Bedtime Overall  Glucose range: 112-168   71-275  1 57-238    Mean/median:     154   Dietician visit, most recent: 8/15 DIET:  has been trying to reduce fried/usually not eating high fat  foods; eating instant oatmeal in the morning or eggs               Exercise:  walking    Weight history:   Wt Readings from Last 3 Encounters:  07/02/16 132 lb (59.9 kg)  06/11/16 131 lb (59.4 kg)  06/03/16 135 lb 4 oz (61.3 kg)  Glycemic control:   Lab Results  Component Value Date   HGBA1C 6.9 (H) 06/06/2016   HGBA1C 6.4 03/07/2016   HGBA1C 6.6 (H) 10/19/2015   Lab Results  Component Value Date   MICROALBUR 21.2 (H) 06/06/2016   LDLCALC 59 10/15/2015   CREATININE 1.47 06/06/2016   No visits with results within 1 Week(s) from this visit.  Latest known visit with results is:  Lab on 06/06/2016  Component Date Value Ref Range Status  . Hgb A1c MFr Bld 06/06/2016 6.9* 4.6 - 6.5 % Final  . Sodium 06/06/2016 135  135 - 145 mEq/L Final  . Potassium 06/06/2016 3.6  3.5 - 5.1 mEq/L Final  . Chloride 06/06/2016 97  96 - 112 mEq/L Final  . CO2 06/06/2016 27  19 - 32 mEq/L Final  . Glucose, Bld 06/06/2016 205* 70 - 99 mg/dL Final  . BUN 06/06/2016 32* 6 - 23 mg/dL Final  . Creatinine, Ser 06/06/2016 1.47  0.40 - 1.50  mg/dL Final  . Calcium 06/06/2016 9.2  8.4 - 10.5 mg/dL Final  . GFR 06/06/2016 50.62* >60.00 mL/min Final  . Microalb, Ur 06/06/2016 21.2* 0.0 - 1.9 mg/dL Final  . Creatinine,U 06/06/2016 59.7  mg/dL Final  . Microalb Creat Ratio 06/06/2016 35.5* 0.0 - 30.0 mg/g Final         Medication List       Accurate as of 07/02/16 10:29 AM. Always use your most recent med list.          amLODipine 10 MG tablet Commonly known as:  NORVASC Take 1 tablet (10 mg total) by mouth at bedtime.   aspirin 81 MG chewable tablet Chew 81 mg by mouth daily.   atorvastatin 20 MG tablet Commonly known as:  LIPITOR Take 1 tablet (20 mg total) by mouth daily.   azelastine 0.1 % nasal spray Commonly known as:  ASTELIN Place 2 sprays into both nostrils 2 (two) times daily. Use in each nostril as directed   b complex vitamins tablet Take 1 tablet by mouth daily.   Cholecalciferol 2000 units Caps Take 1 capsule (2,000 Units total) by mouth daily.   clotrimazole-betamethasone cream Commonly known as:  LOTRISONE Apply 1 application topically 2 (two) times daily. apply to lesion on chest wall   diclofenac 50 MG EC tablet Commonly known as:  VOLTAREN TAKE 1 TABLET(50 MG) BY MOUTH TWICE DAILY   EDURANT 25 MG Tabs tablet Generic drug:  rilpivirine TAKE 1 TABLET BY MOUTH DAILY WITH BREAKFAST   EFFIENT 10 MG Tabs tablet Generic drug:  prasugrel TAKE 1 TABLET BY MOUTH EVERY DAY WITH SUPPER   freestyle lancets Use as directed three times a day to check blood sugar.  DX E11.9   furosemide 20 MG tablet Commonly known as:  LASIX TAKE 2 TABLETS BY MOUTH DAILY AS NEEDED FOR FLUID RETENTION OR SWELLING   glucose blood test strip Commonly known as:  FREESTYLE TEST STRIPS Use three times daily to check blood sugar.  DX E11.9   Insulin Glargine 300 UNIT/ML Sopn Commonly known as:  TOUJEO SOLOSTAR Inject 10 Units into the skin daily.   Insulin Pen Needle 31G X 5 MM Misc Commonly known as:  B-D  UF III MINI PEN NEEDLES 1 each by Does not apply route 2 (two) times daily.   Krill Oil Omega-3 300 MG Caps Take 1 capsule daily   lisinopril 5 MG tablet Commonly known as:  PRINIVIL,ZESTRIL TAKE 1/2 TABLET BY MOUTH DAILY   loratadine 10  MG tablet Commonly known as:  CLARITIN Take 1 tablet (10 mg total) by mouth daily.   metolazone 2.5 MG tablet Commonly known as:  ZAROXOLYN TAKE 1 TABLET BY MOUTH ONCE DAILY 20 MINUTES PRIOR TO FUROSEMIDE   mupirocin ointment 2 % Commonly known as:  BACTROBAN Place 1 application into the nose 2 (two) times daily.   neomycin-polymyxin-hydrocortisone otic solution Commonly known as:  CORTISPORIN INSTILL 3 DROPS IN BOTH EARS THREE TIMES DAILY   nitroGLYCERIN 0.4 MG SL tablet Commonly known as:  NITROSTAT Place 1 tablet (0.4 mg total) under the tongue every 5 (five) minutes as needed for chest pain.   NORVIR 100 MG Tabs tablet Generic drug:  ritonavir TAKE 1 TABLET(100 MG) BY MOUTH DAILY   oxymetazoline 0.05 % nasal spray Commonly known as:  AFRIN NASAL SPRAY Place 1 spray into both nostrils 2 (two) times daily.   pantoprazole 40 MG tablet Commonly known as:  PROTONIX Take 1 tablet (40 mg total) by mouth daily.   pioglitazone 15 MG tablet Commonly known as:  ACTOS TAKE 1 TABLET(15 MG) BY MOUTH DAILY   potassium chloride SA 20 MEQ tablet Commonly known as:  K-DUR,KLOR-CON TAKE 1 TABLET(20 MEQ) BY MOUTH THREE TIMES DAILY   PREZISTA 800 MG tablet Generic drug:  Darunavir Ethanolate TAKE 1 TABLET(800 MG) BY MOUTH DAILY   PROBIOTIC DAILY Caps Take 1 by mouth daily   repaglinide 2 MG tablet Commonly known as:  PRANDIN TAKE 2 TABLETS BY MOUTH BEFORE BREAKFAST, 1 TABLET BY MOUTH BEFORE LUNCH AND 2 TABLETS BY MOUTH BEFORE SUPPER   sodium bicarbonate 650 MG tablet Take 650 mg by mouth 3 (three) times daily.   TIVICAY 50 MG tablet Generic drug:  dolutegravir TAKE 1 TABLET BY MOUTH DAILY   tiZANidine 4 MG capsule Commonly known  as:  ZANAFLEX TAKE 1 CAPSULE BY MOUTH AT BEDTIME FOR MUSCLE SPASMS   tiZANidine 4 MG capsule Commonly known as:  ZANAFLEX TAKE 1 CAPSULE BY MOUTH AT BEDTIME FOR MUSCLE SPASMS   triamcinolone cream 0.1 % Commonly known as:  KENALOG APP TO DRY ITCHY SKIN BID PRN   valACYclovir 500 MG tablet Commonly known as:  VALTREX Take 1 tablet (500 mg total) by mouth daily.       Allergies: No Known Allergies  Past Medical History:  Diagnosis Date  . Arthritis   . CAD (coronary artery disease)    2003- cabg  . Chronic kidney disease   . Constipation 05/28/2014  . COPD (chronic obstructive pulmonary disease) ([place])   . Dermatitis 11/27/2012  . Diabetes mellitus   . Epicondylitis 06/05/2013   right  . GERD (gastroesophageal reflux disease)   . History of depression   . History of kidney stones   . HIV (human immunodeficiency virus infection) ([place]) 1991   on meds since initial dx.   . Hyperlipidemia   . Hypertension   . Nasal abscess 12/04/2015  . Nocturia 03/06/2013  . Pain in joint, ankle and foot 01/19/2015  . Pancreatitis 11/2013   attributed to HIV meds.     Past Surgical History:  Procedure Laterality Date  . CARDIAC CATHETERIZATION N/A 07/29/2015   Procedure: Left Heart Cath and Coronary Angiography;  Surgeon: [name], MD;  Location: [facility];  Service: Cardiovascular;  Laterality: N/A;  . CARDIAC CATHETERIZATION N/A 07/29/2015   Procedure: Coronary Stent Intervention;  Surgeon: [name], MD;  Location: [facility];  Service: Cardiovascular;  Laterality: N/A;  . CORONARY  ARTERY BYPASS GRAFT  05/2002  . VASECTOMY      Family History  Problem Relation Age of Onset  . Hyperlipidemia Mother   . Diabetes Mother     paternal grandparents/1 brother  . Hyperlipidemia Father   . Hypertension Father     paternal grandmother/3 brothers/1 sister  . Arthritis      mother/father/paternal grandparents  . Breast cancer Maternal Aunt     paternal aunt  .  Lung cancer Maternal Aunt   . Heart disease      parents/maternal grandparents/ 2 brothers  . Stroke Paternal Grandmother   . Mental retardation Sister     Social History:  reports that he has never smoked. He has never used smokeless tobacco. He reports that he does not drink alcohol or use drugs.    Review of Systems    Lipid history: On Lipitor 10 mg, followed by PCP His triglycerides are much higher than usual in 10/17.  He is taking some fish oil from PCP's advice   Lab Results  Component Value Date   CHOL 149 05/19/2016   HDL 28.80 (L) 05/19/2016   LDLCALC 59 10/15/2015   LDLDIRECT 54.0 05/19/2016   TRIG (H) 05/19/2016    446.0 Triglyceride is over 400; calculations on Lipids are invalid.   CHOLHDL 5 05/19/2016            RENAL dysfunction: Followed by nephrologist,This is mild etiology and stable   Lab Results  Component Value Date   CREATININE 1.47 06/06/2016   BUN 32 (H) 06/06/2016   NA 135 06/06/2016   K 3.6 06/06/2016   CL 97 06/06/2016   CO2 27 06/06/2016     Hypertension: Has been treated with various drugs in the past,  taking ?  5 mg amlodipine and 5 mg lisinopril Had edema with 10 mg amlodipine   BP Readings from Last 3 Encounters:  07/02/16 120/60  06/11/16 112/72  06/10/16 131/70    Diabetic foot exam  in 7/16 shows normal monofilament sensation in the toes and plantar surfaces, no skin lesions or ulcers on the feet and practically absent pedal pulses    Physical Examination:  BP 120/60   Pulse 70   Ht 5\' 3"  (1.6 m)   Wt 132 lb (59.9 kg)   BMI 23.38 kg/m     ASSESSMENT:  Diabetes type 2, uncontrolled with BMI 23; has  abdominal obesity   See history of present illness for detailed discussion of current diabetes management, blood sugar patterns and problems identified  He was started on insulin with Toujeo 6 units because of persistently high fasting readings and he was not wanting to take metformin More recently fasting blood  sugars are near target At suppertime blood sugars are low normal, occasionally has symptoms with glucose of 70 Still taking Actos and high doses of Prandin  HIGH triglycerides: Need to be followed  PLAN:  More blood sugar monitoring after supper We will continue same dose of Prandin at breakfast and supper but try to leave off the lunchtime dose for now No change in Toujeo, discussed fasting blood sugar targets again     Patient Instructions  Stop Lunch time Prandin      [facility] 07/02/2016, 10:29 AM   Note: This office note was prepared with Estate agent. Any transcriptional errors that result from this process are unintentional.

## 2016-07-05 ENCOUNTER — Other Ambulatory Visit: Payer: Self-pay | Admitting: Medical

## 2016-07-05 ENCOUNTER — Other Ambulatory Visit: Payer: Self-pay | Admitting: Family Medicine

## 2016-07-09 ENCOUNTER — Ambulatory Visit
Admission: RE | Admit: 2016-07-09 | Discharge: 2016-07-09 | Disposition: A | Discharge status: Home / Self Care | Payer: Medicare Other | Source: Ambulatory Visit | Attending: Otolaryngology | Admitting: Otolaryngology

## 2016-07-09 DIAGNOSIS — R221 Localized swelling, mass and lump, neck: Secondary | ICD-10-CM

## 2016-07-09 MED ORDER — IOPAMIDOL (ISOVUE-300) INJECTION 61%
75.0000 mL | Freq: Once | INTRAVENOUS | Status: AC | PRN
  Administered 2016-07-09: 75 mL via INTRAVENOUS

## 2016-07-10 ENCOUNTER — Other Ambulatory Visit: Payer: Self-pay | Admitting: Endocrinology

## 2016-07-20 ENCOUNTER — Other Ambulatory Visit: Payer: Self-pay | Admitting: Cardiology

## 2016-07-24 ENCOUNTER — Other Ambulatory Visit: Payer: Self-pay | Admitting: Family Medicine

## 2016-07-30 DIAGNOSIS — H2513 Age-related nuclear cataract, bilateral: Secondary | ICD-10-CM | POA: Diagnosis not present

## 2016-07-30 DIAGNOSIS — E119 Type 2 diabetes mellitus without complications: Secondary | ICD-10-CM | POA: Diagnosis not present

## 2016-07-31 ENCOUNTER — Encounter: Payer: Self-pay | Admitting: Podiatry

## 2016-07-31 ENCOUNTER — Ambulatory Visit (INDEPENDENT_AMBULATORY_CARE_PROVIDER_SITE_OTHER): Payer: Medicare Other | Admitting: Podiatry

## 2016-07-31 VITALS — BP 127/67 | HR 68

## 2016-07-31 DIAGNOSIS — L600 Ingrowing nail: Secondary | ICD-10-CM | POA: Diagnosis not present

## 2016-07-31 DIAGNOSIS — M25572 Pain in left ankle and joints of left foot: Secondary | ICD-10-CM

## 2016-07-31 DIAGNOSIS — I2510 Atherosclerotic heart disease of native coronary artery without angina pectoris: Secondary | ICD-10-CM

## 2016-07-31 NOTE — Progress Notes (Signed)
SUBJECTIVE: 70 y.o. year old male presents for follow up on left great toe nail.  OBJECTIVE:  DERMATOLOGIC EXAMINATION: Well healed ingrown nail surgery site.  VASCULAR EXAMINATION OF LOWER LIMBS: Pedal pulses are not palpable on both feet.  NEUROLOGIC EXAMINATION OF THE LOWER LIMBS: All epicritic and tactile sensations grossly intact.   MUSCULOSKELETAL EXAMINATION: Positive of plantar flexed 5th met left foot.   ASSESSMENT: Well healed old surgery site. PVD   PLAN: Reviewed clinical findings and encouraged to get back to walking exercise. All nails debrided. Return in 6 months or sooner if needed.

## 2016-07-31 NOTE — Patient Instructions (Signed)
6 month check up. Last ingrown nail surgery site healed well and doing well. Noted of not palpable pedal pulses on both feet. May benefit from daily light walking exercise. All nails debrided. Return in 6 month or sooner if needed.

## 2016-08-02 ENCOUNTER — Other Ambulatory Visit: Payer: Self-pay | Admitting: Internal Medicine

## 2016-08-02 DIAGNOSIS — B20 Human immunodeficiency virus [HIV] disease: Secondary | ICD-10-CM

## 2016-08-07 ENCOUNTER — Other Ambulatory Visit: Payer: Self-pay | Admitting: Endocrinology

## 2016-08-09 ENCOUNTER — Other Ambulatory Visit: Payer: Self-pay | Admitting: Family Medicine

## 2016-08-12 ENCOUNTER — Ambulatory Visit: Payer: Medicare Other

## 2016-08-15 ENCOUNTER — Encounter: Payer: Self-pay | Admitting: Internal Medicine

## 2016-08-16 ENCOUNTER — Other Ambulatory Visit: Payer: Self-pay | Admitting: Physician Assistant

## 2016-08-18 NOTE — Telephone Encounter (Signed)
Rx(s) sent to pharmacy electronically.  

## 2016-08-19 ENCOUNTER — Encounter (HOSPITAL_COMMUNITY): Payer: Self-pay | Admitting: Family Medicine

## 2016-08-19 ENCOUNTER — Ambulatory Visit (INDEPENDENT_AMBULATORY_CARE_PROVIDER_SITE_OTHER): Payer: Medicare Other

## 2016-08-19 ENCOUNTER — Ambulatory Visit (HOSPITAL_COMMUNITY)
Admission: EM | Admit: 2016-08-19 | Discharge: 2016-08-19 | Disposition: A | Discharge status: Home / Self Care | Payer: Medicare Other | Attending: Family Medicine | Admitting: Family Medicine

## 2016-08-19 DIAGNOSIS — M79662 Pain in left lower leg: Secondary | ICD-10-CM | POA: Diagnosis not present

## 2016-08-19 DIAGNOSIS — S8012XA Contusion of left lower leg, initial encounter: Secondary | ICD-10-CM

## 2016-08-19 DIAGNOSIS — M7989 Other specified soft tissue disorders: Secondary | ICD-10-CM | POA: Diagnosis not present

## 2016-08-19 NOTE — ED Triage Notes (Signed)
Pt here for LLE pain and swelling. sts a few days ago a dog ran into his left shin,.

## 2016-08-19 NOTE — Discharge Instructions (Signed)
You do not show any fractures or other acute findings on x ray. I recommend you elevate the extremity, apply ice, rest, and apply ACE bandages for compression. You may take Tylenol ever 4-6 hours for pain, not to exceed 4000 mg a day of Tylenol from all sources. Should your pain fail to resolve, follow up with your primary care provider or return to clinic as needed.
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
## 2016-08-21 ENCOUNTER — Other Ambulatory Visit: Payer: Self-pay | Admitting: Family Medicine

## 2016-08-24 ENCOUNTER — Other Ambulatory Visit (HOSPITAL_COMMUNITY): Payer: Self-pay | Admitting: Cardiology

## 2016-08-25 ENCOUNTER — Ambulatory Visit: Payer: Self-pay | Admitting: Family Medicine

## 2016-08-27 ENCOUNTER — Ambulatory Visit (INDEPENDENT_AMBULATORY_CARE_PROVIDER_SITE_OTHER): Payer: Medicare Other | Admitting: Family Medicine

## 2016-08-27 ENCOUNTER — Encounter: Payer: Self-pay | Admitting: Family Medicine

## 2016-08-27 ENCOUNTER — Ambulatory Visit (HOSPITAL_BASED_OUTPATIENT_CLINIC_OR_DEPARTMENT_OTHER)
Admission: RE | Admit: 2016-08-27 | Discharge: 2016-08-27 | Disposition: A | Discharge status: Home / Self Care | Payer: Medicare Other | Source: Ambulatory Visit | Attending: Family Medicine | Admitting: Family Medicine

## 2016-08-27 ENCOUNTER — Other Ambulatory Visit: Payer: Self-pay

## 2016-08-27 VITALS — BP 100/58 | HR 85 | Temp 98.1°F | Resp 16 | Ht 63.0 in | Wt 135.0 lb

## 2016-08-27 DIAGNOSIS — S8992XA Unspecified injury of left lower leg, initial encounter: Secondary | ICD-10-CM | POA: Insufficient documentation

## 2016-08-27 DIAGNOSIS — M79605 Pain in left leg: Secondary | ICD-10-CM | POA: Diagnosis not present

## 2016-08-27 NOTE — Progress Notes (Signed)
Pre visit review using our clinic review tool, if applicable. No additional management support is needed unless otherwise documented below in the visit note. 

## 2016-08-27 NOTE — Progress Notes (Signed)
Chief Complaint  Patient presents with  . Acute Visit    pt c/o lump on left leg, pt report the lump is very pain about 1 week ago.    Subjective: Patient is a 71 y.o. male here for lump on L leg.  Painful lump on L leg around 10 daysago after dog ran into him. He went to UC 2 days later and XRs were neg. He is still having pain, swelling, difficulty walking, and some discoloration. He is concerned that he may have a clot. No calf pain, fevers, streaking redness, drainage, or numbness/tingling. He has been using Tylenol and ice 2-3x daily at home.   ROS: MSK: +LLE pain, +swelling Neuro: No numbness or tingling  Family History  Problem Relation Age of Onset  . Hyperlipidemia Mother   . Diabetes Mother     paternal grandparents/1 brother  . Hyperlipidemia Father   . Hypertension Father     paternal grandmother/3 brothers/1 sister  . Arthritis      mother/father/paternal grandparents  . Breast cancer Maternal Aunt     paternal aunt  . Lung cancer Maternal Aunt   . Heart disease      parents/maternal grandparents/ 2 brothers  . Stroke Paternal Grandmother   . Mental retardation Sister    Past Medical History:  Diagnosis Date  . Arthritis   . CAD (coronary artery disease)    2003- [facility]  . Chronic kidney disease   . Constipation 05/28/2014  . COPD (chronic obstructive pulmonary disease) ([place])   . Dermatitis 11/27/2012  . Diabetes mellitus   . Epicondylitis 06/05/2013   right  . GERD (gastroesophageal reflux disease)   . History of depression   . History of kidney stones   . HIV (human immunodeficiency virus infection) ([place]) 1991   on meds since initial dx.   . Hyperlipidemia   . Hypertension   . Nasal abscess 12/04/2015  . Nocturia 03/06/2013  . Pain in joint, ankle and foot 01/19/2015  . Pancreatitis 11/2013   attributed to HIV meds.    No Known Allergies  Current Outpatient Prescriptions:  .  amLODipine (NORVASC) 10 MG tablet, Take 1 tablet (10 mg total) by mouth at  bedtime., Disp: 90 tablet, Rfl: 1 .  aspirin 81 MG chewable tablet, Chew 81 mg by mouth daily., Disp: , Rfl:  .  atorvastatin (LIPITOR) 20 MG tablet, TAKE 1 TABLET(20 MG) BY MOUTH DAILY, Disp: 30 tablet, Rfl: 0 .  azelastine (ASTELIN) 0.1 % nasal spray, Place 2 sprays into both nostrils 2 (two) times daily. Use in each nostril as directed, Disp: 30 mL, Rfl: 3 .  b complex vitamins tablet, Take 1 tablet by mouth daily., Disp: 30 tablet, Rfl: 11 .  Cholecalciferol 2000 UNITS CAPS, Take 1 capsule (2,000 Units total) by mouth daily., Disp: 30 each, Rfl: 11 .  clotrimazole-betamethasone (LOTRISONE) cream, Apply 1 application topically 2 (two) times daily. apply to lesion on chest wall, Disp: 45 g, Rfl: 1 .  diclofenac (VOLTAREN) 50 MG EC tablet, TAKE 1 TABLET(50 MG) BY MOUTH TWICE DAILY, Disp: 180 tablet, Rfl: 0 .  EDURANT 25 MG TABS tablet, TAKE 1 TABLET BY MOUTH DAILY WITH BREAKFAST, Disp: 30 tablet, Rfl: 6 .  furosemide (LASIX) 20 MG tablet, TAKE 2 TABLETS BY MOUTH DAILY AS NEEDED FOR FLUID RETENTION OR SWELLING, Disp: 60 tablet, Rfl: 0 .  glucose blood (FREESTYLE TEST STRIPS) test strip, Use three times daily to check blood sugar.  DX E11.9, Disp: 100 each, Rfl:  6 .  Insulin Glargine (TOUJEO SOLOSTAR) 300 UNIT/ML SOPN, Inject 10 Units into the skin daily. (Patient taking differently: Inject 6 Units into the skin daily. ), Disp: 3 pen, Rfl: 3 .  Insulin Pen Needle (B-D UF III MINI PEN NEEDLES) 31G X 5 MM MISC, 1 each by Does not apply route 2 (two) times daily., Disp: 100 each, Rfl: 1 .  Krill Oil Omega-3 300 MG CAPS, Take 1 capsule daily, Disp: 30 capsule, Rfl: 11 .  Lancets (FREESTYLE) lancets, Use as directed three times a day to check blood sugar.  DX E11.9, Disp: 100 each, Rfl: 11 .  lisinopril (PRINIVIL,ZESTRIL) 5 MG tablet, TAKE 1/2 TABLET BY MOUTH DAILY (Patient taking differently: TAKE 1/2 TABLET BY MOUTH Every other day), Disp: 45 tablet, Rfl: 0 .  loratadine (CLARITIN) 10 MG tablet, Take 1  tablet (10 mg total) by mouth daily., Disp: 30 tablet, Rfl: 11 .  metolazone (ZAROXOLYN) 2.5 MG tablet, TAKE 1 TABLET BY MOUTH ONCE DAILY 20 MINUTES PRIOR TO FUROSEMIDE, Disp: 90 tablet, Rfl: 0 .  mupirocin ointment (BACTROBAN) 2 %, Place 1 application into the nose 2 (two) times daily., Disp: 22 g, Rfl: 0 .  neomycin-polymyxin-hydrocortisone (CORTISPORIN) otic solution, INSTILL 3 DROPS IN BOTH EARS THREE TIMES DAILY, Disp: 10 mL, Rfl: 0 .  nitroGLYCERIN (NITROSTAT) 0.4 MG SL tablet, Place 1 tablet (0.4 mg total) under the tongue every 5 (five) minutes as needed for chest pain., Disp: 25 tablet, Rfl: 12 .  NORVIR 100 MG TABS tablet, TAKE 1 TABLET(100 MG) BY MOUTH DAILY, Disp: 30 tablet, Rfl: 11 .  oxymetazoline (AFRIN NASAL SPRAY) 0.05 % nasal spray, Place 1 spray into both nostrils 2 (two) times daily., Disp: 30 mL, Rfl: 0 .  pantoprazole (PROTONIX) 40 MG tablet, TAKE 1 TABLET(40 MG) BY MOUTH DAILY, Disp: 30 tablet, Rfl: 9 .  pioglitazone (ACTOS) 15 MG tablet, TAKE 1 TABLET(15 MG) BY MOUTH DAILY, Disp: 30 tablet, Rfl: 0 .  potassium chloride SA (K-DUR,KLOR-CON) 20 MEQ tablet, TAKE 1 TABLET(20 MEQ) BY MOUTH THREE TIMES DAILY, Disp: 90 tablet, Rfl: 0 .  PREZISTA 800 MG tablet, TAKE 1 TABLET(800 MG) BY MOUTH DAILY, Disp: 30 tablet, Rfl: 11 .  Probiotic Product (PROBIOTIC DAILY) CAPS, Take 1 by mouth daily, Disp: 30 capsule, Rfl: 11 .  repaglinide (PRANDIN) 2 MG tablet, TAKE 2 TABLETS BY MOUTH BEFORE BREAKFAST, 1 TABLET BY MOUTH BEFORE LUNCH AND 2 TABLETS BY MOUTH BEFORE SUPPER, Disp: 450 tablet, Rfl: 0 .  sodium bicarbonate 650 MG tablet, Take 650 mg by mouth 3 (three) times daily., Disp: , Rfl:  .  TIVICAY 50 MG tablet, TAKE 1 TABLET BY MOUTH DAILY, Disp: 30 tablet, Rfl: 0 .  tiZANidine (ZANAFLEX) 4 MG capsule, TAKE 1 CAPSULE BY MOUTH AT BEDTIME FOR MUSCLE SPASMS, Disp: 7 capsule, Rfl: 0 .  tiZANidine (ZANAFLEX) 4 MG capsule, TAKE 1 CAPSULE BY MOUTH AT BEDTIME FOR MUSCLE SPASMS, Disp: 7 capsule, Rfl:  0 .  triamcinolone cream (KENALOG) 0.1 %, APP TO DRY ITCHY SKIN BID PRN, Disp: , Rfl: 2 .  valACYclovir (VALTREX) 500 MG tablet, Take 1 tablet (500 mg total) by mouth daily., Disp: 30 tablet, Rfl: 5 .  atorvastatin (LIPITOR) 20 MG tablet, TAKE 1 TABLET(20 MG) BY MOUTH DAILY (Patient not taking: Reported on 08/27/2016), Disp: 90 tablet, Rfl: 0 .  prasugrel (EFFIENT) 10 MG TABS tablet, TAKE 1 TABLET BY MOUTH EVERY DAY WITH SUPPER, Disp: 30 tablet, Rfl: 6  Objective: BP (!) 100/58 (BP  Location: Left Arm, Patient Position: Sitting, Cuff Size: Normal)   Pulse 85   Temp 98.1 F (36.7 C) (Oral)   Resp 16   Ht 5\' 3"  (1.6 m)   Wt 135 lb (61.2 kg)   SpO2 98%   BMI 23.91 kg/m  General: Awake, appears stated age HEENT: MMM, EOMi Heart: Trace pitting edema on RLE, 2+ edema on L  Lungs: No accessory muscle use MSK: +TTP over anterior distal tibia, no calf TTP, gait mildly antalgic Neuro: Sensation intact to light touch b/l Skin: No erythema, warmth, fluctuance or drainage Psych: Age appropriate judgment and insight, normal affect and mood  Assessment and Plan: Injury of left lower extremity, initial encounter - Plan: DG Tibia/Fibula Left  Orders as above. XR shows no acute changes unofficially. It was obtained to evaluate for possible occult fx.  Add Voltaren to regimen of Tylenol and ice. I do not want him to physically exert himself as he is in the process of moving and has to get things packed. F/u in 1 week if symptoms fail to improve. Will consider CAM boot and MRI at that point. The patient voiced understanding and agreement to the plan.  [place], DO 08/27/16  3:31 PM

## 2016-08-27 NOTE — Patient Instructions (Addendum)
Listen to your body. Stay active, but don't overdo it.  Keep elevating and using ice.  Keep using Tylenol. Add in Voltaren twice daily for pain as well.  If you are not better in 1 week, return to clinic.  I do not thin you have a clot based on your story and the location of the pain.

## 2016-09-01 ENCOUNTER — Ambulatory Visit: Payer: Self-pay | Admitting: Endocrinology

## 2016-09-06 ENCOUNTER — Other Ambulatory Visit: Payer: Self-pay | Admitting: Internal Medicine

## 2016-09-06 DIAGNOSIS — B20 Human immunodeficiency virus [HIV] disease: Secondary | ICD-10-CM

## 2016-09-08 ENCOUNTER — Other Ambulatory Visit: Payer: Self-pay | Admitting: *Deleted

## 2016-09-08 ENCOUNTER — Other Ambulatory Visit: Payer: Self-pay | Admitting: Endocrinology

## 2016-09-08 DIAGNOSIS — B20 Human immunodeficiency virus [HIV] disease: Secondary | ICD-10-CM

## 2016-09-08 MED ORDER — RILPIVIRINE HCL 25 MG PO TABS: 25.0000 mg | ORAL_TABLET | Freq: Every day | ORAL | 0 refills | Status: DC

## 2016-09-08 MED ORDER — DOLUTEGRAVIR SODIUM 50 MG PO TABS: 50.0000 mg | ORAL_TABLET | Freq: Every day | ORAL | 0 refills | Status: DC

## 2016-09-13 ENCOUNTER — Other Ambulatory Visit: Payer: Self-pay | Admitting: Endocrinology

## 2016-09-15 ENCOUNTER — Encounter: Payer: Self-pay | Admitting: Cardiology

## 2016-09-15 ENCOUNTER — Other Ambulatory Visit (HOSPITAL_COMMUNITY): Payer: Self-pay | Admitting: Cardiology

## 2016-09-15 MED ORDER — ATORVASTATIN CALCIUM 20 MG PO TABS: 20.0000 mg | ORAL_TABLET | Freq: Every day | ORAL | 2 refills | Status: DC

## 2016-09-18 ENCOUNTER — Other Ambulatory Visit: Payer: Self-pay | Admitting: Family Medicine

## 2016-09-22 ENCOUNTER — Other Ambulatory Visit (INDEPENDENT_AMBULATORY_CARE_PROVIDER_SITE_OTHER): Payer: Medicare Other

## 2016-09-22 DIAGNOSIS — E1165 Type 2 diabetes mellitus with hyperglycemia: Secondary | ICD-10-CM | POA: Diagnosis not present

## 2016-09-22 LAB — BASIC METABOLIC PANEL
BUN: 36 mg/dL — ABNORMAL HIGH (ref 6–23)
CALCIUM: 9.4 mg/dL (ref 8.4–10.5)
CO2: 26 mEq/L (ref 19–32)
CREATININE: 1.49 mg/dL (ref 0.40–1.50)
Chloride: 100 mEq/L (ref 96–112)
GFR: 49.8 mL/min — AB (ref >60.00)
Glucose, Bld: 180 mg/dL — ABNORMAL HIGH (ref 70–99)
Potassium: 3.3 mEq/L — ABNORMAL LOW (ref 3.5–5.1)
Sodium: 137 mEq/L (ref 135–145)

## 2016-09-22 LAB — HEMOGLOBIN A1C: HEMOGLOBIN A1C: 7.6 % — AB (ref 4.6–6.5)

## 2016-09-25 ENCOUNTER — Encounter: Payer: Self-pay | Admitting: Family Medicine

## 2016-09-25 ENCOUNTER — Ambulatory Visit (INDEPENDENT_AMBULATORY_CARE_PROVIDER_SITE_OTHER): Payer: Medicare Other | Admitting: Family Medicine

## 2016-09-25 DIAGNOSIS — K219 Gastro-esophageal reflux disease without esophagitis: Secondary | ICD-10-CM

## 2016-09-25 DIAGNOSIS — I10 Essential (primary) hypertension: Secondary | ICD-10-CM | POA: Diagnosis not present

## 2016-09-25 DIAGNOSIS — E11319 Type 2 diabetes mellitus with unspecified diabetic retinopathy without macular edema: Secondary | ICD-10-CM | POA: Diagnosis not present

## 2016-09-25 DIAGNOSIS — R634 Abnormal weight loss: Secondary | ICD-10-CM

## 2016-09-25 DIAGNOSIS — E782 Mixed hyperlipidemia: Secondary | ICD-10-CM

## 2016-09-25 MED ORDER — B COMPLEX PO TABS: 1.0000 | ORAL_TABLET | Freq: Every day | ORAL | 11 refills | Status: AC

## 2016-09-25 MED ORDER — KRILL OIL 300 MG PO CAPS: 300.0000 mg | ORAL_CAPSULE | Freq: Every day | ORAL | 11 refills | Status: DC

## 2016-09-25 MED ORDER — CHOLECALCIFEROL 50 MCG (2000 UT) PO CAPS: 1.0000 | ORAL_CAPSULE | Freq: Every day | ORAL | 11 refills | Status: DC

## 2016-09-25 MED ORDER — PROBIOTIC DAILY PO CAPS: ORAL_CAPSULE | ORAL | 11 refills | Status: AC

## 2016-09-25 NOTE — Assessment & Plan Note (Signed)
Well controlled, no changes to meds. Encouraged heart healthy diet such as the DASH diet and exercise as tolerated.  °

## 2016-09-25 NOTE — Patient Instructions (Signed)
Try to massage the leg with Aspercreme and Lidocaine gels to the leg twice daily, elevate and consider compression socks, report if worsens.  Carbohydrate Counting for Diabetes Mellitus, Adult Carbohydrate counting is a method for keeping track of how many carbohydrates you eat. Eating carbohydrates naturally increases the amount of sugar (glucose) in the blood. Counting how many carbohydrates you eat helps keep your blood glucose within normal limits, which helps you manage your diabetes (diabetes mellitus). It is important to know how many carbohydrates you can safely have in each meal. This is different for every person. A diet and nutrition specialist (registered dietitian) can help you make a meal plan and calculate how many carbohydrates you should have at each meal and snack. Carbohydrates are found in the following foods:  Grains, such as breads and cereals.  Dried beans and soy products.  Starchy vegetables, such as potatoes, peas, and corn.  Fruit and fruit juices.  Milk and yogurt.  Sweets and snack foods, such as cake, cookies, candy, chips, and soft drinks. How do I count carbohydrates? There are two ways to count carbohydrates in food. You can use either of the methods or a combination of both. Reading "Nutrition Facts" on packaged food  The "Nutrition Facts" list is included on the labels of almost all packaged foods and beverages in the [place] It includes:  The serving size.  Information about nutrients in each serving, including the grams (g) of carbohydrate per serving. To use the "Nutrition Facts":  Decide how many servings you will have.  Multiply the number of servings by the number of carbohydrates per serving.  The resulting number is the total amount of carbohydrates that you will be having. Learning standard serving sizes of other foods  When you eat foods containing carbohydrates that are not packaged or do not include "Nutrition Facts" on the label, you need  to measure the servings in order to count the amount of carbohydrates:  Measure the foods that you will eat with a food scale or measuring cup, if needed.  Decide how many standard-size servings you will eat.  Multiply the number of servings by 15. Most carbohydrate-rich foods have about 15 g of carbohydrates per serving.  For example, if you eat 8 oz (170 g) of strawberries, you will have eaten 2 servings and 30 g of carbohydrates (2 servings x 15 g = 30 g).  For foods that have more than one food mixed, such as soups and casseroles, you must count the carbohydrates in each food that is included. The following list contains standard serving sizes of common carbohydrate-rich foods. Each of these servings has about 15 g of carbohydrates:   hamburger bun or  English muffin.   oz (15 mL) syrup.   oz (14 g) jelly.  1 slice of bread.  1 six-inch tortilla.  3 oz (85 g) cooked rice or pasta.  4 oz (113 g) cooked dried beans.  4 oz (113 g) starchy vegetable, such as peas, corn, or potatoes.  4 oz (113 g) hot cereal.  4 oz (113 g) mashed potatoes or  of a large baked potato.  4 oz (113 g) canned or frozen fruit.  4 oz (120 mL) fruit juice.  4-6 crackers.  6 chicken nuggets.  6 oz (170 g) unsweetened dry cereal.  6 oz (170 g) plain fat-free yogurt or yogurt sweetened with artificial sweeteners.  8 oz (240 mL) milk.  8 oz (170 g) fresh fruit or one small piece of  fruit.  24 oz (680 g) popped popcorn. Example of carbohydrate counting Sample meal  3 oz (85 g) chicken breast.  6 oz (170 g) brown rice.  4 oz (113 g) corn.  8 oz (240 mL) milk.  8 oz (170 g) strawberries with sugar-free whipped topping. Carbohydrate calculation 1. Identify the foods that contain carbohydrates:  Rice.  Corn.  Milk.  Strawberries. 2. Calculate how many servings you have of each food:  2 servings rice.  1 serving corn.  1 serving milk.  1 serving  strawberries. 3. Multiply each number of servings by 15 g:  2 servings rice x 15 g = 30 g.  1 serving corn x 15 g = 15 g.  1 serving milk x 15 g = 15 g.  1 serving strawberries x 15 g = 15 g. 4. Add together all of the amounts to find the total grams of carbohydrates eaten:  30 g + 15 g + 15 g + 15 g = 75 g of carbohydrates total. This information is not intended to replace advice given to you by your health care provider. Make sure you discuss any questions you have with your health care provider. Document Released: 07/21/2005 Document Revised: 02/08/2016 Document Reviewed: 01/02/2016 Elsevier Interactive Patient Education  2017 [employer].

## 2016-09-25 NOTE — Progress Notes (Signed)
Patient ID: [name], male   DOB: Dec 22, 1945, 71 y.o.   MRN: [id number]   Subjective:    Patient ID: [name], male    DOB: 18-Oct-1945, 71 y.o.   MRN: [id number]  Chief Complaint  Patient presents with  . Follow-up  . Leg Swelling    Left leg x2 months.    HPI  Patient is in today for a follow up. Patient states that he has been experiencing edema in his left leg for the past 2 months. Patient has a Hx of HTN, COPD, GERD, HIV, depression. Patient has no additional concerns noted at this time. No recent illness, fevers, hospitalizations. He is in the process of moving so does endorse some minor fatigue. Denies CP/palp/SOB/HA/congestion/fevers/GI or GU c/o. Taking meds as prescribed  I acted as a Education administrator for Penni Homans, [place], [place]   Past Medical History:  Diagnosis Date  . Arthritis   . CAD (coronary artery disease)    2003- cabg  . Chronic kidney disease   . Constipation 05/28/2014  . COPD (chronic obstructive pulmonary disease) ([place])   . Dermatitis 11/27/2012  . Diabetes mellitus   . Epicondylitis 06/05/2013   right  . GERD (gastroesophageal reflux disease)   . History of depression   . History of kidney stones   . HIV (human immunodeficiency virus infection) ([place]) 1991   on meds since initial dx.   . Hyperlipidemia   . Hypertension   . Nasal abscess 12/04/2015  . Nocturia 03/06/2013  . Pain in joint, ankle and foot 01/19/2015  . Pancreatitis 11/2013   attributed to HIV meds.     Past Surgical History:  Procedure Laterality Date  . CARDIAC CATHETERIZATION N/A 07/29/2015   Procedure: Left Heart Cath and Coronary Angiography;  Surgeon: [name], MD;  Location: [facility];  Service: Cardiovascular;  Laterality: N/A;  . CARDIAC CATHETERIZATION N/A 07/29/2015   Procedure: Coronary Stent Intervention;  Surgeon: [name], MD;  Location: [facility];  Service: Cardiovascular;  Laterality: N/A;  . CORONARY ARTERY BYPASS GRAFT  05/2002  .  VASECTOMY      Family History  Problem Relation Age of Onset  . Hyperlipidemia Mother   . Diabetes Mother     paternal grandparents/1 brother  . Hyperlipidemia Father   . Hypertension Father     paternal grandmother/3 brothers/1 sister  . Arthritis      mother/father/paternal grandparents  . Breast cancer Maternal Aunt     paternal aunt  . Lung cancer Maternal Aunt   . Heart disease      parents/maternal grandparents/ 2 brothers  . Stroke Paternal Grandmother   . Mental retardation Sister     Social History   Social History  . Marital status: Divorced    Spouse name: N/A  . Number of children: 4  . Years of education: N/A   Occupational History  . 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    Social History Main Topics  . Smoking status: Never Smoker  . Smokeless tobacco: Never Used  . Alcohol use No  . Drug use: No  . Sexual activity: Not Currently     Comment: declined condoms   Other Topics Concern  . Not on file   Social History Narrative   Lives alone.  Supportive friends and family.  His HIV Dx is not a secret.     Outpatient Medications Prior to  Visit  Medication Sig Dispense Refill  . amLODipine (NORVASC) 10 MG tablet Take 1 tablet (10 mg total) by mouth at bedtime. 90 tablet 1  . aspirin 81 MG chewable tablet Chew 81 mg by mouth daily.    Marland Kitchen atorvastatin (LIPITOR) 20 MG tablet Take 1 tablet (20 mg total) by mouth daily. 90 tablet 2  . diclofenac (VOLTAREN) 50 MG EC tablet TAKE 1 TABLET(50 MG) BY MOUTH TWICE DAILY 180 tablet 0  . dolutegravir (TIVICAY) 50 MG tablet Take 1 tablet (50 mg total) by mouth daily. 30 tablet 0  . furosemide (LASIX) 20 MG tablet TAKE 2 TABLETS BY MOUTH DAILY AS NEEDED FOR FLUID RETENTION OR SWELLING 60 tablet 0  . glucose blood (FREESTYLE TEST STRIPS) test strip Use three times daily to check blood sugar.  DX E11.9 100 each 6  . Insulin Glargine (TOUJEO SOLOSTAR) 300 UNIT/ML SOPN Inject 10  Units into the skin daily. (Patient taking differently: Inject 7 Units into the skin daily. ) 3 pen 3  . Insulin Pen Needle (B-D UF III MINI PEN NEEDLES) 31G X 5 MM MISC 1 each by Does not apply route 2 (two) times daily. 100 each 1  . Lancets (FREESTYLE) lancets Use as directed three times a day to check blood sugar.  DX E11.9 100 each 11  . lisinopril (PRINIVIL,ZESTRIL) 5 MG tablet TAKE 1/2 TABLET BY MOUTH DAILY (Patient taking differently: TAKE 1/2 TABLET BY MOUTH Every other day) 45 tablet 0  . metolazone (ZAROXOLYN) 2.5 MG tablet TAKE 1 TABLET BY MOUTH ONCE DAILY 20 MINUTES PRIOR TO FUROSEMIDE 90 tablet 0  . neomycin-polymyxin-hydrocortisone (CORTISPORIN) otic solution INSTILL 3 DROPS IN BOTH EARS THREE TIMES DAILY 10 mL 0  . NORVIR 100 MG TABS tablet TAKE 1 TABLET(100 MG) BY MOUTH DAILY 30 tablet 11  . pantoprazole (PROTONIX) 40 MG tablet TAKE 1 TABLET(40 MG) BY MOUTH DAILY 30 tablet 9  . pioglitazone (ACTOS) 15 MG tablet TAKE 1 TABLET(15 MG) BY MOUTH DAILY 30 tablet 0  . potassium chloride SA (K-DUR,KLOR-CON) 20 MEQ tablet TAKE 1 TABLET(20 MEQ) BY MOUTH THREE TIMES DAILY 90 tablet 0  . PREZISTA 800 MG tablet TAKE 1 TABLET(800 MG) BY MOUTH DAILY 30 tablet 11  . repaglinide (PRANDIN) 2 MG tablet TAKE 2 TABLETS BY MOUTH BEFORE BREAKFAST, 1 TABLET BY MOUTH BEFORE LUNCH AND 2 TABLETS BY MOUTH BEFORE SUPPER 450 tablet 0  . rilpivirine (EDURANT) 25 MG TABS tablet Take 1 tablet (25 mg total) by mouth daily with breakfast. 30 tablet 0  . sodium bicarbonate 650 MG tablet Take 650 mg by mouth 3 (three) times daily.    Marland Kitchen triamcinolone cream (KENALOG) 0.1 % APP TO DRY ITCHY SKIN BID PRN  2  . valACYclovir (VALTREX) 500 MG tablet Take 1 tablet (500 mg total) by mouth daily. 30 tablet 5  . b complex vitamins tablet Take 1 tablet by mouth daily. 30 tablet 11  . Cholecalciferol 2000 UNITS CAPS Take 1 capsule (2,000 Units total) by mouth daily. 30 each 11  . Krill Oil Omega-3 300 MG CAPS Take 1 capsule daily  30 capsule 11  . nitroGLYCERIN (NITROSTAT) 0.4 MG SL tablet Place 1 tablet (0.4 mg total) under the tongue every 5 (five) minutes as needed for chest pain. 25 tablet 12  . Probiotic Product (PROBIOTIC DAILY) CAPS Take 1 by mouth daily 30 capsule 11  . azelastine (ASTELIN) 0.1 % nasal spray Place 2 sprays into both nostrils 2 (two) times daily. Use in  each nostril as directed (Patient not taking: Reported on 09/25/2016) 30 mL 3  . clotrimazole-betamethasone (LOTRISONE) cream Apply 1 application topically 2 (two) times daily. apply to lesion on chest wall (Patient not taking: Reported on 09/25/2016) 45 g 1  . loratadine (CLARITIN) 10 MG tablet Take 1 tablet (10 mg total) by mouth daily. 30 tablet 11  . mupirocin ointment (BACTROBAN) 2 % Place 1 application into the nose 2 (two) times daily. (Patient not taking: Reported on 09/25/2016) 22 g 0  . oxymetazoline (AFRIN NASAL SPRAY) 0.05 % nasal spray Place 1 spray into both nostrils 2 (two) times daily. (Patient not taking: Reported on 09/25/2016) 30 mL 0  . prasugrel (EFFIENT) 10 MG TABS tablet TAKE 1 TABLET BY MOUTH EVERY DAY WITH SUPPER 30 tablet 6  . tiZANidine (ZANAFLEX) 4 MG capsule TAKE 1 CAPSULE BY MOUTH AT BEDTIME FOR MUSCLE SPASMS (Patient not taking: Reported on 09/25/2016) 7 capsule 0  . tiZANidine (ZANAFLEX) 4 MG capsule TAKE 1 CAPSULE BY MOUTH AT BEDTIME FOR MUSCLE SPASMS (Patient not taking: Reported on 09/25/2016) 7 capsule 0   No facility-administered medications prior to visit.     No Known Allergies  Review of Systems  Constitutional: Negative for fever and malaise/fatigue.  HENT: Negative for congestion.   Eyes: Negative for blurred vision.  Respiratory: Negative for cough and shortness of breath.   Cardiovascular: Positive for leg swelling. Negative for chest pain and palpitations.       Left leg.  Gastrointestinal: Negative for vomiting.  Musculoskeletal: Negative for back pain.  Skin: Negative for rash.  Neurological: Negative  for loss of consciousness and headaches.       Objective:    Physical Exam  Constitutional: He is oriented to person, place, and time. He appears well-developed and well-nourished. No distress.  HENT:  Head: Normocephalic and atraumatic.  Eyes: Conjunctivae are normal.  Neck: Normal range of motion. No thyromegaly present.  Cardiovascular: Normal rate.   Murmur heard. Pulmonary/Chest: Effort normal and breath sounds normal. He has no wheezes.  Abdominal: Soft. Bowel sounds are normal. There is tenderness.  1+ pedal edema b/l  Musculoskeletal: He exhibits edema. He exhibits no deformity.  1+ pedal edema left lower extremity  Neurological: He is alert and oriented to person, place, and time.  Skin: Skin is warm and dry. He is not diaphoretic.  Psychiatric: He has a normal mood and affect.    BP (!) 108/56 (BP Location: Left Arm, Patient Position: Sitting, Cuff Size: Normal)   Pulse 78   Temp 98.3 F (36.8 C) (Oral)   Wt 136 lb 12.8 oz (62.1 kg)   SpO2 98% Comment: RA  BMI 24.23 kg/m  Wt Readings from Last 3 Encounters:  09/26/16 134 lb (60.8 kg)  09/25/16 136 lb 12.8 oz (62.1 kg)  08/27/16 135 lb (61.2 kg)     Lab Results  Component Value Date   WBC 5.1 05/19/2016   HGB 12.8 (L) 05/19/2016   HCT 36.8 (L) 05/19/2016   PLT 200.0 05/19/2016   GLUCOSE 180 (H) 09/22/2016   CHOL 149 05/19/2016   TRIG (H) 05/19/2016    446.0 Triglyceride is over 400; calculations on Lipids are invalid.   HDL 28.80 (L) 05/19/2016   LDLDIRECT 54.0 05/19/2016   LDLCALC 59 10/15/2015   ALT 17 05/19/2016   AST 14 05/19/2016   NA 137 09/22/2016   K 3.3 (L) 09/22/2016   CL 100 09/22/2016   CREATININE 1.49 09/22/2016   BUN 36 (H)  09/22/2016   CO2 26 09/22/2016   TSH 2.36 05/19/2016   PSA 0.51 03/02/2013   INR 0.99 07/29/2015   HGBA1C 7.6 (H) 09/22/2016   MICROALBUR 21.2 (H) 06/06/2016    Lab Results  Component Value Date   TSH 2.36 05/19/2016   Lab Results  Component Value Date     WBC 5.1 05/19/2016   HGB 12.8 (L) 05/19/2016   HCT 36.8 (L) 05/19/2016   MCV 91.0 05/19/2016   PLT 200.0 05/19/2016   Lab Results  Component Value Date   NA 137 09/22/2016   K 3.3 (L) 09/22/2016   CO2 26 09/22/2016   GLUCOSE 180 (H) 09/22/2016   BUN 36 (H) 09/22/2016   CREATININE 1.49 09/22/2016   BILITOT 0.4 05/19/2016   ALKPHOS 72 05/19/2016   AST 14 05/19/2016   ALT 17 05/19/2016   PROT 7.3 05/19/2016   ALBUMIN 4.1 05/19/2016   CALCIUM 9.4 09/22/2016   ANIONGAP 10 07/31/2015   GFR 49.80 (L) 09/22/2016   Lab Results  Component Value Date   CHOL 149 05/19/2016   Lab Results  Component Value Date   HDL 28.80 (L) 05/19/2016   Lab Results  Component Value Date   LDLCALC 59 10/15/2015   Lab Results  Component Value Date   TRIG (H) 05/19/2016    446.0 Triglyceride is over 400; calculations on Lipids are invalid.   Lab Results  Component Value Date   CHOLHDL 5 05/19/2016   Lab Results  Component Value Date   HGBA1C 7.6 (H) 09/22/2016       Assessment & Plan:   Problem List Items Addressed This Visit    DM (diabetes mellitus), type 2 with ophthalmic complications ([place])     minimize simple carbs. Increase exercise as tolerated. hgba1c is up some but he has been ill lately will continue to monitor no changes for now      Essential hypertension    Well controlled, no changes to meds. Encouraged heart healthy diet such as the DASH diet and exercise as tolerated.       GERD    Avoid offending foods, start probiotics. Do not eat large meals in late evening and consider raising head of bed. Doing well on Pantoprazole      Relevant Medications   Probiotic Product (PROBIOTIC DAILY) CAPS   WEIGHT LOSS, ABNORMAL    Slight weight gain since last visit. Encouraged to maintain adequate protein intake      HLD (hyperlipidemia)    Encouraged heart healthy diet, increase exercise, avoid trans fats, consider a krill oil cap daily         I have discontinued  Mr. Gallacher's clotrimazole-betamethasone, Krill Oil Omega-3, loratadine, oxymetazoline, azelastine, tiZANidine, prasugrel, mupirocin ointment, and tiZANidine. I am also having him maintain his aspirin, sodium bicarbonate, triamcinolone cream, lisinopril, amLODipine, freestyle, glucose blood, furosemide, NORVIR, valACYclovir, Insulin Glargine, Insulin Pen Needle, PREZISTA, diclofenac, neomycin-polymyxin-hydrocortisone, metolazone, pantoprazole, pioglitazone, dolutegravir, rilpivirine, repaglinide, atorvastatin, potassium chloride SA, PROBIOTIC DAILY, b complex vitamins, Cholecalciferol, and Krill Oil.  Meds ordered this encounter  Medications  . Probiotic Product (PROBIOTIC DAILY) CAPS    Sig: Take 1 by mouth daily    Dispense:  30 capsule    Refill:  11  . b complex vitamins tablet    Sig: Take 1 tablet by mouth daily.    Dispense:  30 tablet    Refill:  11  . DISCONTD: Krill Oil 300 MG CAPS    Sig: Take 1 capsule (300 mg total)  by mouth daily.    Dispense:  30 capsule    Refill:  11  . Cholecalciferol 2000 units CAPS    Sig: Take 1 capsule (2,000 Units total) by mouth daily.    Dispense:  30 each    Refill:  11  . Krill Oil 300 MG CAPS    Sig: Take 1 capsule (300 mg total) by mouth daily.    Dispense:  30 capsule    Refill:  11    CMA served as scribe during this visit. History, Physical and Plan performed by medical provider. Documentation and orders reviewed and attested to.  Penni Homans, MD

## 2016-09-25 NOTE — Assessment & Plan Note (Signed)
Slight weight gain since last visit. Encouraged to maintain adequate protein intake

## 2016-09-25 NOTE — Progress Notes (Signed)
Pre visit review using our clinic review tool, if applicable. No additional management support is needed unless otherwise documented below in the visit note. 

## 2016-09-25 NOTE — Assessment & Plan Note (Addendum)
minimize simple carbs. Increase exercise as tolerated. hgba1c is up some but he has been ill lately will continue to monitor no changes for now

## 2016-09-26 ENCOUNTER — Encounter: Payer: Self-pay | Admitting: Endocrinology

## 2016-09-26 ENCOUNTER — Ambulatory Visit (INDEPENDENT_AMBULATORY_CARE_PROVIDER_SITE_OTHER): Payer: Medicare Other | Admitting: Endocrinology

## 2016-09-26 VITALS — BP 120/66 | HR 78 | Ht 63.0 in | Wt 134.0 lb

## 2016-09-26 DIAGNOSIS — E1165 Type 2 diabetes mellitus with hyperglycemia: Secondary | ICD-10-CM | POA: Diagnosis not present

## 2016-09-26 DIAGNOSIS — E1151 Type 2 diabetes mellitus with diabetic peripheral angiopathy without gangrene: Secondary | ICD-10-CM

## 2016-09-26 DIAGNOSIS — E876 Hypokalemia: Secondary | ICD-10-CM

## 2016-09-26 DIAGNOSIS — Z794 Long term (current) use of insulin: Secondary | ICD-10-CM

## 2016-09-26 DIAGNOSIS — E782 Mixed hyperlipidemia: Secondary | ICD-10-CM | POA: Diagnosis not present

## 2016-09-26 MED ORDER — METFORMIN HCL ER 500 MG PO TB24: 1000.0000 mg | ORAL_TABLET | Freq: Every day | ORAL | 3 refills | Status: DC

## 2016-09-26 NOTE — Progress Notes (Signed)
Patient ID: [name], male   DOB: 10-29-1945, 71 y.o.   MRN: [id number]           Reason for Appointment: Follow-up for Type 2 Diabetes  Referring physician: Charlett Blake  History of Present Illness:          Date of diagnosis of type 2 diabetes mellitus: 2013        Background history:  He only mildly increased blood sugar levels at the time of diagnosis and A1c was 7.1  He had been treated initially with metformin but this was stopped subsequently when his renal function was worse  He was subsequently treated with glipizide but his blood sugars had been poorly controlled in 2016 with glipizide alone Since his A1c has been persistently over 9% he was started on Januvia 50 mg in 01/2015 At initial consultation he had a high A1c of 9.5; he was switched from glipizide to Prandin in 02/2015  Recent history:   INSULIN regimen: Toujeo 7 units daily  Oral hypoglycemic drugs the patient is taking are: Prandin before meals 4 mg at breakfast and supper and 2 mg before lunch, Actos 15 mg daily  Because of persistently high fasting readings he was started on Toujeo on 06/11/16  He has had a higher A1c now going up to 7.6, previously 6.9 A1c tends to be lower than expected  Current blood sugar patterns and problems identified:  He has had only a few blood sugars at home and only in the morning  Lab fasting glucose was 180 and his morning readings seem to be higher in the last couple of weeks  Although he has just started walking at least twice a week his weight has gone up  Also he thinks that because of depression he may not have been consistent with diet  He has gone up only 1 unit on his Toujeo when his fasting readings started going up  He thinks he is compliant with his Prandin as before and Actos; may possibly miss lunchtime dose occasionally Dinner is at 5-6 pm  Side effects from medications have been: None  Compliance with the medical regimen: Good, usually trying to take his  Prandin before eating   Glucose monitoring:  done usually 0-1  times a day         Glucometer:  FreeStyle     Blood Glucose readings by download, has only a few readings recently and all fasting:  Mean values apply above for all meters except median for One Touch  PRE-MEAL Fasting Lunch Dinner Bedtime Overall  Glucose range: 130-175       Mean/median:         Dietician visit, most recent: 8/15 DIET:  has been trying to reduce fried/usually not eating high fat  foods; eating oatmeal in the morningWith yogurt               Exercise:  walking recently 1+ miles, 2-3/7 days   Weight history:   Wt Readings from Last 3 Encounters:  09/26/16 134 lb (60.8 kg)  09/25/16 136 lb 12.8 oz (62.1 kg)  08/27/16 135 lb (61.2 kg)    Glycemic control:   Lab Results  Component Value Date   HGBA1C 7.6 (H) 09/22/2016   HGBA1C 6.9 (H) 06/06/2016   HGBA1C 6.4 03/07/2016   Lab Results  Component Value Date   MICROALBUR 21.2 (H) 06/06/2016   LDLCALC 59 10/15/2015   CREATININE 1.49 09/22/2016   Lab on 09/22/2016  Component Date Value  Ref Range Status  . Hgb A1c MFr Bld 09/22/2016 7.6* 4.6 - 6.5 % Final  . Sodium 09/22/2016 137  135 - 145 mEq/L Final  . Potassium 09/22/2016 3.3* 3.5 - 5.1 mEq/L Final  . Chloride 09/22/2016 100  96 - 112 mEq/L Final  . CO2 09/22/2016 26  19 - 32 mEq/L Final  . Glucose, Bld 09/22/2016 180* 70 - 99 mg/dL Final  . BUN 09/22/2016 36* 6 - 23 mg/dL Final  . Creatinine, Ser 09/22/2016 1.49  0.40 - 1.50 mg/dL Final  . Calcium 09/22/2016 9.4  8.4 - 10.5 mg/dL Final  . GFR 09/22/2016 49.80* >60.00 mL/min Final       Allergies as of 09/26/2016   No Known Allergies     Medication List       Accurate as of 09/26/16  9:09 AM. Always use your most recent med list.          amLODipine 10 MG tablet Commonly known as:  NORVASC Take 1 tablet (10 mg total) by mouth at bedtime.   aspirin 81 MG chewable tablet Chew 81 mg by mouth daily.   atorvastatin 20 MG  tablet Commonly known as:  LIPITOR Take 1 tablet (20 mg total) by mouth daily.   b complex vitamins tablet Take 1 tablet by mouth daily.   Cholecalciferol 2000 units Caps Take 1 capsule (2,000 Units total) by mouth daily.   diclofenac 50 MG EC tablet Commonly known as:  VOLTAREN TAKE 1 TABLET(50 MG) BY MOUTH TWICE DAILY   dolutegravir 50 MG tablet Commonly known as:  TIVICAY Take 1 tablet (50 mg total) by mouth daily.   freestyle lancets Use as directed three times a day to check blood sugar.  DX E11.9   furosemide 20 MG tablet Commonly known as:  LASIX TAKE 2 TABLETS BY MOUTH DAILY AS NEEDED FOR FLUID RETENTION OR SWELLING   glucose blood test strip Commonly known as:  FREESTYLE TEST STRIPS Use three times daily to check blood sugar.  DX E11.9   Insulin Glargine 300 UNIT/ML Sopn Commonly known as:  TOUJEO SOLOSTAR Inject 10 Units into the skin daily.   Insulin Pen Needle 31G X 5 MM Misc Commonly known as:  B-D UF III MINI PEN NEEDLES 1 each by Does not apply route 2 (two) times daily.   Krill Oil 300 MG Caps Take 1 capsule (300 mg total) by mouth daily.   lisinopril 5 MG tablet Commonly known as:  PRINIVIL,ZESTRIL TAKE 1/2 TABLET BY MOUTH DAILY   metolazone 2.5 MG tablet Commonly known as:  ZAROXOLYN TAKE 1 TABLET BY MOUTH ONCE DAILY 20 MINUTES PRIOR TO FUROSEMIDE   neomycin-polymyxin-hydrocortisone otic solution Commonly known as:  CORTISPORIN INSTILL 3 DROPS IN BOTH EARS THREE TIMES DAILY   nitroGLYCERIN 0.4 MG SL tablet Commonly known as:  NITROSTAT Place 1 tablet (0.4 mg total) under the tongue every 5 (five) minutes as needed for chest pain.   NORVIR 100 MG Tabs tablet Generic drug:  ritonavir TAKE 1 TABLET(100 MG) BY MOUTH DAILY   pantoprazole 40 MG tablet Commonly known as:  PROTONIX TAKE 1 TABLET(40 MG) BY MOUTH DAILY   pioglitazone 15 MG tablet Commonly known as:  ACTOS TAKE 1 TABLET(15 MG) BY MOUTH DAILY   potassium chloride SA 20 MEQ  tablet Commonly known as:  K-DUR,KLOR-CON TAKE 1 TABLET(20 MEQ) BY MOUTH THREE TIMES DAILY   PREZISTA 800 MG tablet Generic drug:  Darunavir Ethanolate TAKE 1 TABLET(800 MG) BY MOUTH DAILY  PROBIOTIC DAILY Caps Take 1 by mouth daily   repaglinide 2 MG tablet Commonly known as:  PRANDIN TAKE 2 TABLETS BY MOUTH BEFORE BREAKFAST, 1 TABLET BY MOUTH BEFORE LUNCH AND 2 TABLETS BY MOUTH BEFORE SUPPER   rilpivirine 25 MG Tabs tablet Commonly known as:  EDURANT Take 1 tablet (25 mg total) by mouth daily with breakfast.   sodium bicarbonate 650 MG tablet Take 650 mg by mouth 3 (three) times daily.   triamcinolone cream 0.1 % Commonly known as:  KENALOG APP TO DRY ITCHY SKIN BID PRN   valACYclovir 500 MG tablet Commonly known as:  VALTREX Take 1 tablet (500 mg total) by mouth daily.       Allergies: No Known Allergies  Past Medical History:  Diagnosis Date  . Arthritis   . CAD (coronary artery disease)    2003- cabg  . Chronic kidney disease   . Constipation 05/28/2014  . COPD (chronic obstructive pulmonary disease) ([place])   . Dermatitis 11/27/2012  . Diabetes mellitus   . Epicondylitis 06/05/2013   right  . GERD (gastroesophageal reflux disease)   . History of depression   . History of kidney stones   . HIV (human immunodeficiency virus infection) ([place]) 1991   on meds since initial dx.   . Hyperlipidemia   . Hypertension   . Nasal abscess 12/04/2015  . Nocturia 03/06/2013  . Pain in joint, ankle and foot 01/19/2015  . Pancreatitis 11/2013   attributed to HIV meds.     Past Surgical History:  Procedure Laterality Date  . CARDIAC CATHETERIZATION N/A 07/29/2015   Procedure: Left Heart Cath and Coronary Angiography;  Surgeon: [name], MD;  Location: [facility];  Service: Cardiovascular;  Laterality: N/A;  . CARDIAC CATHETERIZATION N/A 07/29/2015   Procedure: Coronary Stent Intervention;  Surgeon: [name], MD;  Location: [facility];  Service:  Cardiovascular;  Laterality: N/A;  . CORONARY ARTERY BYPASS GRAFT  05/2002  . VASECTOMY      Family History  Problem Relation Age of Onset  . Hyperlipidemia Mother   . Diabetes Mother     paternal grandparents/1 brother  . Hyperlipidemia Father   . Hypertension Father     paternal grandmother/3 brothers/1 sister  . Arthritis      mother/father/paternal grandparents  . Breast cancer Maternal Aunt     paternal aunt  . Lung cancer Maternal Aunt   . Heart disease      parents/maternal grandparents/ 2 brothers  . Stroke Paternal Grandmother   . Mental retardation Sister     Social History:  reports that he has never smoked. He has never used smokeless tobacco. He reports that he does not drink alcohol or use drugs.    Review of Systems    Lipid history: On Lipitor 10 mg, followed by PCP His triglycerides are much higher than usual in 10/17.    He is taking  fish oil But only one capsule daily   Lab Results  Component Value Date   CHOL 149 05/19/2016   HDL 28.80 (L) 05/19/2016   LDLCALC 59 10/15/2015   LDLDIRECT 54.0 05/19/2016   TRIG (H) 05/19/2016    446.0 Triglyceride is over 400; calculations on Lipids are invalid.   CHOLHDL 5 05/19/2016            RENAL dysfunction: Followed by nephrologist,This is mild  and stable He has history of edema but is taking large doses of Lasix and potassium is  low now   Lab Results  Component Value Date   CREATININE 1.49 09/22/2016   BUN 36 (H) 09/22/2016   NA 137 09/22/2016   K 3.3 (L) 09/22/2016   CL 100 09/22/2016   CO2 26 09/22/2016     Hypertension: Has been treated with various drugs in the past,  taking amlodipine and 5 mg lisinopril Had edema with 10 mg amlodipine   BP Readings from Last 3 Encounters:  09/26/16 120/66  09/25/16 (!) 108/56  08/27/16 (!) 100/58    Diabetic foot exam  in 09/2016 shows normal monofilament sensation in the toes and plantar surfaces, no skin lesions or ulcers on the feet and absent  pedal pulses    Physical Examination:  BP 120/66   Pulse 78   Ht 5\' 3"  (1.6 m)   Wt 134 lb (60.8 kg)   SpO2 98%   BMI 23.74 kg/m     Diabetic Foot Exam - Simple   Simple Foot Form Diabetic Foot exam was performed with the following findings:  Yes 09/26/2016  9:10 AM  Visual Inspection No deformities, no ulcerations, no other skin breakdown bilaterally:  Yes Sensation Testing Intact to touch and monofilament testing bilaterally:  Yes Pulse Check See comments:  Yes Comments Absent pulses     ASSESSMENT:  Diabetes type 2, uncontrolled with BMI 23; has  abdominal obesity   See history of present illness for detailed discussion of current diabetes management, blood sugar patterns and problems identified  His A1c is higher at 7.6 Currently has been checking only some fasting blood sugars and No postprandial readings These readings also appeared to be relatively higher and lab glucose was 180 fasting indicating further insulin deficiency Also may still be having high postprandial readings which he is not monitoring Basal insulin is not adequate currently Still taking Actos and high doses of Prandin  HIGH triglycerides:  previously over 400 and is not on any specific treatment, only taking 1 fish oil Needs to be followed up  PLAN:  Consistent blood sugar monitoring after supper Also needs to check sugars in the mornings daily Start metformin ER 500 mg for the first 5 days and then 1000 mg a day Consider mealtime insulin on the next visit Continue to improve diet with balanced meals, limit carbohydrates and fats More regular exercise Increase INSULIN up to 9 units for now  We will continue same dose of Prandin at breakfast and supper   follow-up in 6 weeks with fasting lipids  He will need to address his hypokalemia with his PCP, currently getting large doses of diuretics and has no edema along with low normal blood pressure    Patient Instructions  Check blood  sugars on waking up  3x weekly  Also check blood sugars about 2 hours after a meal and do this after different meals by rotation  Recommended blood sugar levels on waking up is 90-130 and about 2 hours after meal is 130-160  Please bring your blood sugar monitor to each visit, thank you    [facility] 09/26/2016, 9:09 AM   Note: This office note was prepared with Estate agent. Any transcriptional errors that result from this process are unintentional.

## 2016-09-26 NOTE — Patient Instructions (Addendum)
Check blood sugars on waking up  3x weekly  Also check blood sugars about 2 hours after a meal and do this after different meals by rotation  Recommended blood sugar levels on waking up is 90-130 and about 2 hours after meal is 130-160  Please bring your blood sugar monitor to each visit, thank you  Insulin 9 units  Start taking Metformin 500 mg, 1 tablet with your main meal for 5 days. Occasionally this may initially cause loose stools or nausea. If  tolerating well after 5 days add a second Metformin tablet (500 mg) at the same time  Take 3 fish oil

## 2016-09-27 ENCOUNTER — Other Ambulatory Visit: Payer: Self-pay | Admitting: Physician Assistant

## 2016-09-29 ENCOUNTER — Other Ambulatory Visit: Payer: Self-pay | Admitting: Cardiology

## 2016-09-29 NOTE — Telephone Encounter (Signed)
Rx(s) sent to pharmacy electronically.  

## 2016-10-01 ENCOUNTER — Encounter: Payer: Self-pay | Admitting: Family Medicine

## 2016-10-03 NOTE — Assessment & Plan Note (Signed)
Encouraged heart healthy diet, increase exercise, avoid trans fats, consider a krill oil cap daily 

## 2016-10-03 NOTE — Assessment & Plan Note (Signed)
Avoid offending foods, start probiotics. Do not eat large meals in late evening and consider raising head of bed. Doing well on Pantoprazole 

## 2016-10-07 ENCOUNTER — Other Ambulatory Visit: Payer: Self-pay | Admitting: Endocrinology

## 2016-10-07 ENCOUNTER — Other Ambulatory Visit: Payer: Self-pay | Admitting: Internal Medicine

## 2016-10-07 DIAGNOSIS — B20 Human immunodeficiency virus [HIV] disease: Secondary | ICD-10-CM

## 2016-10-07 MED ORDER — RILPIVIRINE HCL 25 MG PO TABS: 25.0000 mg | ORAL_TABLET | Freq: Every day | ORAL | 5 refills | Status: DC

## 2016-10-14 ENCOUNTER — Telehealth: Payer: Self-pay | Admitting: *Deleted

## 2016-10-14 ENCOUNTER — Encounter: Payer: Self-pay | Admitting: *Deleted

## 2016-10-14 ENCOUNTER — Ambulatory Visit (INDEPENDENT_AMBULATORY_CARE_PROVIDER_SITE_OTHER): Payer: Medicare Other | Admitting: *Deleted

## 2016-10-14 VITALS — BP 104/56 | HR 73 | Resp 16 | Ht 63.0 in | Wt 138.8 lb

## 2016-10-14 DIAGNOSIS — Z0000 Encounter for general adult medical examination without abnormal findings: Secondary | ICD-10-CM

## 2016-10-14 DIAGNOSIS — Z23 Encounter for immunization: Secondary | ICD-10-CM | POA: Diagnosis not present

## 2016-10-14 NOTE — Telephone Encounter (Signed)
He did not request any specific provider and does not have preference for physician vs APP, but did marginally prefer a male provider given the option.  [facility] is now the closest office for him, so given that, I will arrange transfer of care w/ Dorothyann Peng pending PCP approval.  Thank you for the feedback!

## 2016-10-14 NOTE — Telephone Encounter (Signed)
Did he request me specifically? If I see a family member, Id be happy to see him. I primarily for medicare patients am accepting referrals. Or if Dr. 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 Martinique if he preferred a physician at [facility].   I am moving to [place] in the fall- they have several providers available even right now with schedules more open than mine.

## 2016-10-14 NOTE — Telephone Encounter (Signed)
Patient in for AWV today and reports he has moved to [facility] and would like to transfer his care to a PCP closer to his new apartment. Please advise if okay to transfer and I will working on arranging follow-up/transfer of care visit for him in May when he is next due for follow-up. Thanks!

## 2016-10-14 NOTE — Progress Notes (Signed)
Pre visit review using our clinic review tool, if applicable. No additional management support is needed unless otherwise documented below in the visit note. 

## 2016-10-14 NOTE — Progress Notes (Addendum)
Subjective:   [name] is a 71 y.o. male who presents for Medicare Annual/Subsequent preventive examination. He is here today with his granddaughter, [name], who is in the 1st grade.  Review of Systems:  No ROS.  Medicare Wellness Visit.  Cardiac Risk Factors include: advanced age (>43men, >7 women);diabetes mellitus;dyslipidemia;male gender;hypertension  Sleep patterns: no sleep issues, gets up 2-3 times nightly to void and sleeps 6-7 hours nightly. Feels rested on waking.  Home Safety/Smoke Alarms: Feels safe in home. Smoke alarms in place.    Living environment; residence and Firearm Safety: Lives alone.  apartment, number of outside stairs: 1, can live on one level, no firearms. Seat Belt Safety/Bike Helmet: Wears seat belt.   Counseling:   Eye Exam- Dr. Dorcas Carrow yearly. Dental- Follows w/ dentist every few years. Upcoming appt on March 22.   Male:   CCS- last 07/21/12. 1 polyp removed, external hemorrhoids. 5 year recall.    PSA- Ongoing screening recommendations per PCP.  Lab Results  Component Value Date   PSA 0.51 03/02/2013   PSA 0.52 02/22/2008        Objective:    Vitals: BP (!) 104/56 (BP Location: Right Arm, Patient Position: Sitting, Cuff Size: Normal)   Pulse 73   Resp 16   Ht 5\' 3"  (1.6 m)   Wt 138 lb 12.8 oz (63 kg)   SpO2 99%   BMI 24.59 kg/m   Body mass index is 24.59 kg/m.  Tobacco History  Smoking Status  . Never Smoker  Smokeless Tobacco  . Never Used     Counseling given: Not Answered   Past Medical History:  Diagnosis Date  . Arthritis   . CAD (coronary artery disease)    2003- cabg  . Chronic kidney disease   . Constipation 05/28/2014  . COPD (chronic obstructive pulmonary disease) ([place])   . Dermatitis 11/27/2012  . Diabetes mellitus   . Epicondylitis 06/05/2013   right  . GERD (gastroesophageal reflux disease)   . History of depression   . History of kidney stones   . HIV (human immunodeficiency virus infection) ([place]) 1991     on meds since initial dx.   . Hyperlipidemia   . Hypertension   . Nasal abscess 12/04/2015  . Nocturia 03/06/2013  . Pain in joint, ankle and foot 01/19/2015  . Pancreatitis 11/2013   attributed to HIV meds.    Past Surgical History:  Procedure Laterality Date  . CARDIAC CATHETERIZATION N/A 07/29/2015   Procedure: Left Heart Cath and Coronary Angiography;  Surgeon: [name], MD;  Location: [facility];  Service: Cardiovascular;  Laterality: N/A;  . CARDIAC CATHETERIZATION N/A 07/29/2015   Procedure: Coronary Stent Intervention;  Surgeon: [name], MD;  Location: [facility];  Service: Cardiovascular;  Laterality: N/A;  . CORONARY ARTERY BYPASS GRAFT  05/2002  . VASECTOMY     Family History  Problem Relation Age of Onset  . Hyperlipidemia Mother   . Diabetes Mother     paternal grandparents/1 brother  . Hyperlipidemia Father   . Hypertension Father     paternal grandmother/3 brothers/1 sister  . Arthritis      mother/father/paternal grandparents  . Breast cancer Maternal Aunt     paternal aunt  . Lung cancer Maternal Aunt   . Heart disease      parents/maternal grandparents/ 2 brothers  . Stroke Paternal Grandmother   . Mental retardation Sister    History  Sexual Activity  .  Sexual activity: Not Currently    Comment: declined condoms    Outpatient Encounter Prescriptions as of 10/14/2016  Medication Sig  . amLODipine (NORVASC) 10 MG tablet Take 1 tablet (10 mg total) by mouth at bedtime. (Patient taking differently: Take 5 mg by mouth at bedtime. )  . aspirin 81 MG chewable tablet Chew 81 mg by mouth daily.  Marland Kitchen atorvastatin (LIPITOR) 20 MG tablet Take 1 tablet (20 mg total) by mouth daily.  Marland Kitchen b complex vitamins tablet Take 1 tablet by mouth daily.  . Cholecalciferol 2000 units CAPS Take 1 capsule (2,000 Units total) by mouth daily.  . diclofenac (VOLTAREN) 50 MG EC tablet TAKE 1 TABLET(50 MG) BY MOUTH TWICE DAILY  . furosemide (LASIX) 20 MG  tablet TAKE 2 TABLETS BY MOUTH DAILY AS NEEDED FOR FLUID RETENTION OR SWELLING  . glucose blood (FREESTYLE TEST STRIPS) test strip Use three times daily to check blood sugar.  DX E11.9  . Insulin Glargine (TOUJEO SOLOSTAR) 300 UNIT/ML SOPN Inject 10 Units into the skin daily. (Patient taking differently: Inject 9 Units into the skin daily. )  . Insulin Pen Needle (B-D UF III MINI PEN NEEDLES) 31G X 5 MM MISC 1 each by Does not apply route 2 (two) times daily.  Javier Docker Oil 300 MG CAPS Take 1 capsule (300 mg total) by mouth daily.  . Lancets (FREESTYLE) lancets Use as directed three times a day to check blood sugar.  DX E11.9  . lisinopril (PRINIVIL,ZESTRIL) 5 MG tablet TAKE 1/2 TABLET BY MOUTH DAILY (Patient taking differently: TAKE 1/2 TABLET BY MOUTH Every other day)  . metFORMIN (GLUCOPHAGE-XR) 500 MG 24 hr tablet Take 2 tablets (1,000 mg total) by mouth daily with supper.  . metolazone (ZAROXOLYN) 2.5 MG tablet TAKE 1 TABLET BY MOUTH ONCE DAILY 20 MINUTES PRIOR TO FUROSEMIDE  . neomycin-polymyxin-hydrocortisone (CORTISPORIN) otic solution INSTILL 3 DROPS IN BOTH EARS THREE TIMES DAILY  . nitroGLYCERIN (NITROSTAT) 0.4 MG SL tablet Place 1 tablet (0.4 mg total) under the tongue every 5 (five) minutes as needed for chest pain.  Marland Kitchen NORVIR 100 MG TABS tablet TAKE 1 TABLET(100 MG) BY MOUTH DAILY  . pantoprazole (PROTONIX) 40 MG tablet TAKE 1 TABLET(40 MG) BY MOUTH DAILY  . pioglitazone (ACTOS) 15 MG tablet TAKE 1 TABLET(15 MG) BY MOUTH DAILY  . potassium chloride SA (K-DUR,KLOR-CON) 20 MEQ tablet TAKE 1 TABLET(20 MEQ) BY MOUTH THREE TIMES DAILY  . PREZISTA 800 MG tablet TAKE 1 TABLET(800 MG) BY MOUTH DAILY  . Probiotic Product (PROBIOTIC DAILY) CAPS Take 1 by mouth daily  . repaglinide (PRANDIN) 2 MG tablet TAKE 2 TABLETS BY MOUTH BEFORE BREAKFAST, 1 TABLET BY MOUTH BEFORE LUNCH AND 2 TABLETS BY MOUTH BEFORE SUPPER  . rilpivirine (EDURANT) 25 MG TABS tablet Take 1 tablet (25 mg total) by mouth daily  with breakfast.  . sodium bicarbonate 650 MG tablet Take 650 mg by mouth 3 (three) times daily.  Marland Kitchen TIVICAY 50 MG tablet TAKE 1 TABLET BY MOUTH DAILY  . triamcinolone cream (KENALOG) 0.1 % APP TO DRY ITCHY SKIN BID PRN  . valACYclovir (VALTREX) 500 MG tablet Take 1 tablet (500 mg total) by mouth daily.   No facility-administered encounter medications on file as of 10/14/2016.     Activities of Daily Living In your present state of health, do you have any difficulty performing the following activities: 10/14/2016  Hearing? N  Vision? N  Difficulty concentrating or making decisions? Y  Walking or climbing stairs? N  Dressing or bathing? N  Doing errands, shopping? N  Preparing Food and eating ? N  Using the Toilet? N  In the past six months, have you accidently leaked urine? N  Do you have problems with loss of bowel control? N  Managing your Medications? N  Managing your Finances? N  Housekeeping or managing your Housekeeping? N  Some recent data might be hidden    Patient Care Team: Mosie Lukes, MD as PCP - General (Family Medicine) Michel Bickers, MD as PCP - Infectious Diseases (Infectious Diseases) Royann Shivers, MD as Referring Physician (Nephrology) Elayne Snare, MD as Consulting Physician (Endocrinology) Milus Banister, MD as Attending Physician (Gastroenterology) Lelon Perla, MD as Consulting Physician (Cardiology) Joseph Art, OD as Referring Physician (Optometry)   Assessment:    Physical assessment deferred to PCP.  Exercise Activities and Dietary recommendations Current Exercise Habits: The patient does not participate in regular exercise at present, but plans to start walking again once the weather is warmer.  Diet (meal preparation, eat out, water intake, caffeinated beverages, dairy products, fruits and vegetables): in general, a "healthy" diet  , well balanced, on average, 3 meals per day. Meals vary. Drinks mostly water and some coffee on cold  mornings. Occasionally drinks diet soda. Breakfast: cereal or oatmeal or eggs Lunch: sandwich Dinner: varies, but usually includes meat and vegetables.      Goals    . Continue to exercise regularly.   (pt-stated)      Fall Risk Fall Risk  10/14/2016 04/15/2016 10/29/2015 10/12/2015 08/21/2015  Falls in the past year? No No No No Yes  Risk for fall due to : - - - - -  Risk for fall due to (comments): - - - - -   Depression Screen PHQ 2/9 Scores 10/14/2016 11/21/2015 10/29/2015 10/12/2015  PHQ - 2 Score 2 0 0 0  PHQ- 9 Score 3 - - -    Cognitive Function MMSE - Mini Mental State Exam 10/14/2016 10/12/2015  Orientation to time 4 5  Orientation to Place 5 5  Registration 3 3  Attention/ Calculation 4 5  Recall 2 3  Language- name 2 objects 2 2  Language- repeat 1 1  Language- follow 3 step command 3 3  Language- read & follow direction 1 1  Write a sentence 1 1  Copy design 1 1  Total score 27 30        Immunization History  Administered Date(s) Administered  . H1N1 06/09/2008  . Influenza Split 05/01/2011, 05/18/2012  . Influenza Whole 09/23/2004, 06/01/2007, 05/03/2008, 05/03/2009, 04/10/2010  . Influenza, High Dose Seasonal PF 06/02/2013, 05/14/2016  . Influenza,inj,Quad PF,36+ Mos 05/03/2014, 04/24/2015  . PPD Test 12/04/2009  . Pneumococcal Polysaccharide-23 02/02/2007, 07/04/2010, 04/24/2015  . Td 11/24/2005  . Tdap 12/04/2015   Screening Tests Health Maintenance  Topic Date Due  . PNA vac Low Risk Adult (2 of 2 - PCV13) 04/23/2016  . OPHTHALMOLOGY EXAM  05/23/2016  . HEMOGLOBIN A1C  03/22/2017  . COLONOSCOPY  07/21/2017  . FOOT EXAM  09/26/2017  . TETANUS/TDAP  12/03/2025  . INFLUENZA VACCINE  Completed  . Hepatitis C Screening  Completed      Plan:   Follow-up w/ PCP as directed.  Prevnar-13 given today.   Called Dr. Lynnea Maizes office and requested copy of last eye exam (07/30/16) be faxed to [phone].  During the course of the visit the patient was  educated and counseled about the following appropriate screening and preventive  services:   Vaccines to include Pneumoccal, Influenza, Hepatitis B, Td, HCV  Cardiovascular Disease  Colorectal cancer screening  Diabetes screening  Prostate Cancer Screening  Glaucoma screening  Nutrition counseling   Patient Instructions (the written plan) was given to the patient.    Dorrene German, RN  10/14/2016   RN AWV note reviewed. Agree with documention and plan.  Penni Homans, MD

## 2016-10-14 NOTE — Patient Instructions (Signed)
Mr. Gulino , Thank you for taking time to come for your Medicare Wellness Visit. I appreciate your ongoing commitment to your health goals. Please review the following plan we discussed and let me know if I can assist you in the future.   Your got your pneumococcal (Prevnar-13) vaccine today.  Create and bring a copy of your advance directives to your next office visit. We will contact you regarding transferring your care to a closer office.  These are the goals we discussed: Goals    . Continue to exercise regularly.   (pt-stated)       This is a list of the screening recommended for you and due dates:  Health Maintenance  Topic Date Due  . Pneumonia vaccines (2 of 2 - PCV13) 04/23/2016  . Eye exam for diabetics  05/23/2016  . Hemoglobin A1C  03/22/2017  . Colon Cancer Screening  07/21/2017  . Complete foot exam   09/26/2017  . Tetanus Vaccine  12/03/2025  . Flu Shot  Completed  .  Hepatitis C: One time screening is recommended by Center for Disease Control  (CDC) for  adults born from [birth year] through 1963.   Completed    Advance Directive Advance directives are legal documents that let you make choices ahead of time about your health care and medical treatment in case you become unable to communicate for yourself. Advance directives are a way for you to communicate your wishes to family, friends, and health care providers. This can help convey your decisions about end-of-life care if you become unable to communicate. Discussing and writing advance directives should happen over time rather than all at once. Advance directives can be changed depending on your situation and what you want, even after you have signed the advance directives. If you do not have an advance directive, some states assign family decision makers to act on your behalf based on how closely you are related to them. Each state has its own laws regarding advance directives. You may want to check with your health care  provider, attorney, or state representative about the laws in your state. There are different types of advance directives, such as:  Medical power of attorney.  Living will.  Do not resuscitate (DNR) or do not attempt resuscitation (DNAR) order. Health care proxy and medical power of attorney A health care proxy, also called a health care agent, is a person who is appointed to make medical decisions for you in cases in which you are unable to make the decisions yourself. Generally, people choose someone they know well and trust to represent their preferences. Make sure to ask this person for an agreement to act as your proxy. A proxy may have to exercise judgment in the event of a medical decision for which your wishes are not known. A medical power of attorney is a legal document that names your health care proxy. Depending on the laws in your state, after the document is written, it may also need to be:  Signed.  Notarized.  Dated.  Copied.  Witnessed.  Incorporated into your medical record. You may also want to appoint someone to manage your financial affairs in a situation in which you are unable to do so. This is called a durable power of attorney for finances. It is a separate legal document from the durable power of attorney for health care. You may choose the same person or someone different from your health care proxy to act as your agent in financial  matters. If you do not appoint a proxy, or if there is a concern that the proxy is not acting in your best interests, a court-appointed guardian may be designated to act on your behalf. Living will A living will is a set of instructions documenting your wishes about medical care when you cannot express them yourself. Health care providers should keep a copy of your living will in your medical record. You may want to give a copy to family members or friends. To alert caregivers in case of an emergency, you can place a card in your  wallet to let them know that you have a living will and where they can find it. A living will is used if you become:  Terminally ill.  Incapacitated.  Unable to communicate or make decisions. Items to consider in your living will include:  The use or non-use of life-sustaining equipment, such as dialysis machines and breathing machines (ventilators).  A DNR or DNAR order, which is the instruction not to use cardiopulmonary resuscitation (CPR) if breathing or heartbeat stops.  The use or non-use of tube feeding.  Withholding of food and fluids.  Comfort (palliative) care when the goal becomes comfort rather than a cure.  Organ and tissue donation. A living will does not give instructions for distributing your money and property if you should pass away. It is recommended that you seek the advice of a lawyer when writing a will. Decisions about taxes, beneficiaries, and asset distribution will be legally binding. This process can relieve your family and friends of any concerns surrounding disputes or questions that may come up about the distribution of your assets. DNR or DNAR A DNR or DNAR order is a request not to have CPR in the event that your heart stops beating or you stop breathing. If a DNR or DNAR order has not been made and shared, a health care provider will try to help any patient whose heart has stopped or who has stopped breathing. If you plan to have surgery, talk with your health care provider about how your DNR or DNAR order will be followed if problems occur. Summary  Advance directives are the legal documents that allow you to make choices ahead of time about your health care and medical treatment in case you become unable to communicate for yourself.  The process of discussing and writing advance directives should happen over time. You can change the advance directives, even after you have signed them.  Advance directives include DNR or DNAR orders, living wills, and  designating an agent as your medical power of attorney. This information is not intended to replace advice given to you by your health care provider. Make sure you discuss any questions you have with your health care provider. Document Released: 10/28/2007 Document Revised: 06/09/2016 Document Reviewed: 06/09/2016 Elsevier Interactive Patient Education  2017 [employer] Inc.  Pneumococcal Conjugate Vaccine (PCV13) What You Need to Know 1. Why get vaccinated? Vaccination can protect both children and adults from pneumococcal disease. Pneumococcal disease is caused by bacteria that can spread from person to person through close contact. It can cause ear infections, and it can also lead to more serious infections of the:  Lungs (pneumonia),  Blood (bacteremia), and  Covering of the brain and spinal cord (meningitis). Pneumococcal pneumonia is most common among adults. Pneumococcal meningitis can cause deafness and brain damage, and it kills about 1 child in 10 who get it. Anyone can get pneumococcal disease, but children under 2 years of  age and adults 36 years and older, people with certain medical conditions, and cigarette smokers are at the highest risk. Before there was a vaccine, the [place] saw:  more than 700 cases of meningitis,  about 13,000 blood infections,  about 5 million ear infections, and  about 200 deaths in children under 5 each year from pneumococcal disease. Since vaccine became available, severe pneumococcal disease in these children has fallen by 88%. About 18,000 older adults die of pneumococcal disease each year in the [place]. Treatment of pneumococcal infections with penicillin and other drugs is not as effective as it used to be, because some strains of the disease have become resistant to these drugs. This makes prevention of the disease, through vaccination, even more important. 2. PCV13 vaccine Pneumococcal conjugate vaccine (called PCV13) protects  against 13 types of pneumococcal bacteria. PCV13 is routinely given to children at 2, 4, 6, and 11-52 months of age. It is also recommended for children and adults 47 to 59 years of age with certain health conditions, and for all adults 29 years of age and older. Your doctor can give you details. 3. Some people should not get this vaccine Anyone who has ever had a life-threatening allergic reaction to a dose of this vaccine, to an earlier pneumococcal vaccine called PCV7, or to any vaccine containing diphtheria toxoid (for example, DTaP), should not get PCV13. Anyone with a severe allergy to any component of PCV13 should not get the vaccine. Tell your doctor if the person being vaccinated has any severe allergies. If the person scheduled for vaccination is not feeling well, your healthcare provider might decide to reschedule the shot on another day. 4. Risks of a vaccine reaction With any medicine, including vaccines, there is a chance of reactions. These are usually mild and go away on their own, but serious reactions are also possible. Problems reported following PCV13 varied by age and dose in the series. The most common problems reported among children were:  About half became drowsy after the shot, had a temporary loss of appetite, or had redness or tenderness where the shot was given.  About 1 out of 3 had swelling where the shot was given.  About 1 out of 3 had a mild fever, and about 1 in 20 had a fever over 102.51F.  Up to about 8 out of 10 became fussy or irritable. Adults have reported pain, redness, and swelling where the shot was given; also mild fever, fatigue, headache, chills, or muscle pain. Young children who get PCV13 along with inactivated flu vaccine at the same time may be at increased risk for seizures caused by fever. Ask your doctor for more information. Problems that could happen after any vaccine:   People sometimes faint after a medical procedure, including  vaccination. Sitting or lying down for about 15 minutes can help prevent fainting, and injuries caused by a fall. Tell your doctor if you feel dizzy, or have vision changes or ringing in the ears.  Some older children and adults get severe pain in the shoulder and have difficulty moving the arm where a shot was given. This happens very rarely.  Any medication can cause a severe allergic reaction. Such reactions from a vaccine are very rare, estimated at about 1 in a million doses, and would happen within a few minutes to a few hours after the vaccination. As with any medicine, there is a very small chance of a vaccine causing a serious injury or death. The  safety of vaccines is always being monitored. For more information, visit: http://www.aguilar.org/ 5. What if there is a serious reaction? What should I look for?  Look for anything that concerns you, such as signs of a severe allergic reaction, very high fever, or unusual behavior. Signs of a severe allergic reaction can include hives, swelling of the face and throat, difficulty breathing, a fast heartbeat, dizziness, and weakness-usually within a few minutes to a few hours after the vaccination. What should I do?   If you think it is a severe allergic reaction or other emergency that can't wait, call 9-1-1 or get the person to the nearest hospital. Otherwise, call your doctor.  Reactions should be reported to the Vaccine Adverse Event Reporting System (VAERS). Your doctor should file this report, or you can do it yourself through the VAERS web site at www.vaers.SamedayNews.es, or by calling (760)177-9234.  VAERS does not give medical advice. 6. The National Vaccine Injury Compensation Program The [employer] Vaccine Injury Compensation Program (VICP) is a federal program that was created to compensate people who may have been injured by certain vaccines. Persons who believe they may have been injured by a vaccine can learn about the program and  about filing a claim by calling 860 740 9842 or visiting the [employer] website at GoldCloset.com.ee. There is a time limit to file a claim for compensation. 7. How can I learn more?  Ask your healthcare provider. He or she can give you the vaccine package insert or suggest other sources of information.  Call your local or state health department.  Contact the Centers for Disease Control and Prevention (CDC):  Call 714-422-1124 (1-800-CDC-INFO) or  Visit CDC's website at http://hunter.com/ Vaccine Information Statement, PCV13 Vaccine (06/08/2014) This information is not intended to replace advice given to you by your health care provider. Make sure you discuss any questions you have with your health care provider. Document Released: 05/18/2006 Document Revised: 04/10/2016 Document Reviewed: 04/10/2016 Elsevier Interactive Patient Education  2017 [employer].

## 2016-10-15 NOTE — Telephone Encounter (Signed)
Of course, great patient would do well with either provider

## 2016-10-16 ENCOUNTER — Other Ambulatory Visit: Payer: Self-pay | Admitting: Family Medicine

## 2016-10-16 NOTE — Telephone Encounter (Signed)
Please reach out to patient to schedule transfer of care appt w/ Dorothyann Peng towards the end of May. Thank you.

## 2016-11-02 ENCOUNTER — Other Ambulatory Visit: Payer: Self-pay | Admitting: Family Medicine

## 2016-11-04 ENCOUNTER — Other Ambulatory Visit (INDEPENDENT_AMBULATORY_CARE_PROVIDER_SITE_OTHER): Payer: Medicare Other

## 2016-11-04 DIAGNOSIS — Z794 Long term (current) use of insulin: Secondary | ICD-10-CM

## 2016-11-04 DIAGNOSIS — E1165 Type 2 diabetes mellitus with hyperglycemia: Secondary | ICD-10-CM

## 2016-11-04 LAB — BASIC METABOLIC PANEL
BUN: 30 mg/dL — ABNORMAL HIGH (ref 6–23)
CALCIUM: 9.5 mg/dL (ref 8.4–10.5)
CHLORIDE: 104 meq/L (ref 96–112)
CO2: 29 meq/L (ref 19–32)
CREATININE: 1.41 mg/dL (ref 0.40–1.50)
GFR: 53.05 mL/min — ABNORMAL LOW (ref >60.00)
GLUCOSE: 159 mg/dL — AB (ref 70–99)
Potassium: 4 mEq/L (ref 3.5–5.1)
SODIUM: 141 meq/L (ref 135–145)

## 2016-11-04 LAB — LIPID PANEL
CHOL/HDL RATIO: 4
Cholesterol: 135 mg/dL (ref 0–200)
HDL: 30.8 mg/dL — ABNORMAL LOW (ref >39.00)
NONHDL: 103.95
TRIGLYCERIDES: 212 mg/dL — AB (ref 0.0–149.0)
VLDL: 42.4 mg/dL — ABNORMAL HIGH (ref 0.0–40.0)

## 2016-11-04 LAB — LDL CHOLESTEROL, DIRECT: LDL DIRECT: 72 mg/dL

## 2016-11-05 ENCOUNTER — Other Ambulatory Visit: Payer: Self-pay | Admitting: Endocrinology

## 2016-11-05 LAB — FRUCTOSAMINE: Fructosamine: 270 umol/L (ref 0–285)

## 2016-11-07 ENCOUNTER — Ambulatory Visit (INDEPENDENT_AMBULATORY_CARE_PROVIDER_SITE_OTHER): Payer: Medicare Other | Admitting: Endocrinology

## 2016-11-07 ENCOUNTER — Encounter: Payer: Self-pay | Admitting: Endocrinology

## 2016-11-07 ENCOUNTER — Other Ambulatory Visit: Payer: Self-pay | Admitting: Family Medicine

## 2016-11-07 VITALS — BP 108/60 | HR 72 | Ht 63.0 in | Wt 135.0 lb

## 2016-11-07 DIAGNOSIS — E876 Hypokalemia: Secondary | ICD-10-CM

## 2016-11-07 DIAGNOSIS — E1165 Type 2 diabetes mellitus with hyperglycemia: Secondary | ICD-10-CM | POA: Diagnosis not present

## 2016-11-07 DIAGNOSIS — E782 Mixed hyperlipidemia: Secondary | ICD-10-CM

## 2016-11-07 MED ORDER — OMEGA-3-ACID ETHYL ESTERS 1 G PO CAPS: 1.0000 g | ORAL_CAPSULE | Freq: Two times a day (BID) | ORAL | 2 refills | Status: DC

## 2016-11-07 NOTE — Patient Instructions (Addendum)
Stop Insulin and start 5 units only if am sugar stays >140  Check blood sugars on waking up  3x weekly  Also check blood sugars about 2 hours after a meal and do this after different meals by rotation  Recommended blood sugar levels on waking up is 90-130 and about 2 hours after meal is 130-160  Please bring your blood sugar monitor to each visit, thank you

## 2016-11-07 NOTE — Progress Notes (Signed)
Patient ID: [name], male   DOB: 1946/03/22, 71 y.o.   MRN: [id number]           Reason for Appointment: Follow-up for Type 2 Diabetes  Referring physician: Charlett Blake  History of Present Illness:          Date of diagnosis of type 2 diabetes mellitus: 2013        Background history:  He only mildly increased blood sugar levels at the time of diagnosis and A1c was 7.1  He had been treated initially with metformin but this was stopped subsequently when his renal function was worse  He was subsequently treated with glipizide but his blood sugars had been poorly controlled in 2016 with glipizide alone Since his A1c has been persistently over 9% he was started on Januvia 50 mg in 01/2015 At initial consultation he had a high A1c of 9.5; he was switched from glipizide to Prandin in 02/2015  Recent history:   INSULIN regimen: Toujeo 9 units daily  Oral hypoglycemic drugs the patient is taking are: Prandin 4 mg at breakfast and supper and 2 mg before lunch, Actos 15 mg daily, metformin ER 1000 mg daily  Because of persistently high fasting readings he was started on Toujeo on 06/11/16 However since his A1c had gone up further and fasting readings were increased he was started on metformin in 2/18  Current blood sugar patterns and problems identified:  He has tolerated metformin well, taking 1000 mg, recent GFR 53  He has started getting low blood sugars now with a couple of low readings overnight and 3 readings low before supper also  His morning readings are mostly near normal although has a couple of readings around 140 and lab glucose was 159  He has not done readings after meals as directed  Highest reading was 248 after eating oatmeal and a high carbohydrate bread  He has however started walking more as he used to and he thinks he is getting at least 2 miles 3 days a week  He thinks he is compliant with his Prandin as before and Actos; may possibly miss lunchtime dose  occasionally Dinner is at 5-6 pm  Side effects from medications have been: None  Compliance with the medical regimen: Good, usually trying to take his Prandin before eating   Glucose monitoring:  done usually 0-1  times a day         Glucometer:  FreeStyle     Blood Glucose readings by download, has only a few readings recently and all fasting:  Mean values apply above for all meters except median for One Touch  PRE-MEAL Fasting Lunch Dinner Overnight  Overall  Glucose range:  75-1 42   1 27-248  44-1 56  40, 52    Mean/median:     104     Dietician visit, most recent: 8/15 DIET:  has been trying to reduce fried/usually not eating high fat  foods; eating oatmeal in the morningWith yogurt               Dinner 6 pm Exercise:  walking recently 2+ miles, 3/7 days   Weight history:   Wt Readings from Last 3 Encounters:  11/07/16 135 lb (61.2 kg)  10/14/16 138 lb 12.8 oz (63 kg)  09/26/16 134 lb (60.8 kg)    Glycemic control:   Lab Results  Component Value Date   HGBA1C 7.6 (H) 09/22/2016   HGBA1C 6.9 (H) 06/06/2016   HGBA1C 6.4 03/07/2016  Lab Results  Component Value Date   MICROALBUR 21.2 (H) 06/06/2016   LDLCALC 59 10/15/2015   CREATININE 1.41 11/04/2016    Other active problems: See review of systems   Lab on 11/04/2016  Component Date Value Ref Range Status  . Sodium 11/04/2016 141  135 - 145 mEq/L Final  . Potassium 11/04/2016 4.0  3.5 - 5.1 mEq/L Final  . Chloride 11/04/2016 104  96 - 112 mEq/L Final  . CO2 11/04/2016 29  19 - 32 mEq/L Final  . Glucose, Bld 11/04/2016 159* 70 - 99 mg/dL Final  . BUN 11/04/2016 30* 6 - 23 mg/dL Final  . Creatinine, Ser 11/04/2016 1.41  0.40 - 1.50 mg/dL Final  . Calcium 11/04/2016 9.5  8.4 - 10.5 mg/dL Final  . GFR 11/04/2016 53.05* >60.00 mL/min Final  . Fructosamine 11/04/2016 270  0 - 285 umol/L Final   Comment: Published reference interval for apparently healthy subjects between age 17 and 58 is 2 - 285 umol/L and  in a poorly controlled diabetic population is 228 - 563 umol/L with a mean of 396 umol/L.   Marland Kitchen Cholesterol 11/04/2016 135  0 - 200 mg/dL Final  . Triglycerides 11/04/2016 212.0* 0.0 - 149.0 mg/dL Final  . HDL 11/04/2016 30.80* >39.00 mg/dL Final  . VLDL 11/04/2016 42.4* 0.0 - 40.0 mg/dL Final  . Total CHOL/HDL Ratio 11/04/2016 4   Final  . NonHDL 11/04/2016 103.95   Final  . Direct LDL 11/04/2016 72.0  mg/dL Final       Allergies as of 11/07/2016   No Known Allergies     Medication List       Accurate as of 11/07/16  3:15 PM. Always use your most recent med list.          amLODipine 10 MG tablet Commonly known as:  NORVASC Take 1 tablet (10 mg total) by mouth at bedtime.   aspirin 81 MG chewable tablet Chew 81 mg by mouth daily.   atorvastatin 20 MG tablet Commonly known as:  LIPITOR Take 1 tablet (20 mg total) by mouth daily.   b complex vitamins tablet Take 1 tablet by mouth daily.   Cholecalciferol 2000 units Caps Take 1 capsule (2,000 Units total) by mouth daily.   diclofenac 50 MG EC tablet Commonly known as:  VOLTAREN TAKE 1 TABLET(50 MG) BY MOUTH TWICE DAILY   freestyle lancets Use as directed three times a day to check blood sugar.  DX E11.9   furosemide 20 MG tablet Commonly known as:  LASIX TAKE 2 TABLETS BY MOUTH DAILY AS NEEDED FOR FLUID RETENTION OR SWELLING   glucose blood test strip Commonly known as:  FREESTYLE TEST STRIPS Use three times daily to check blood sugar.  DX E11.9   Insulin Glargine 300 UNIT/ML Sopn Commonly known as:  TOUJEO SOLOSTAR Inject 10 Units into the skin daily.   Insulin Pen Needle 31G X 5 MM Misc Commonly known as:  B-D UF III MINI PEN NEEDLES 1 each by Does not apply route 2 (two) times daily.   Krill Oil 300 MG Caps Take 1 capsule (300 mg total) by mouth daily.   lisinopril 5 MG tablet Commonly known as:  PRINIVIL,ZESTRIL TAKE 1/2 TABLET BY MOUTH DAILY   metFORMIN 500 MG 24 hr tablet Commonly known as:   GLUCOPHAGE-XR Take 2 tablets (1,000 mg total) by mouth daily with supper.   metolazone 2.5 MG tablet Commonly known as:  ZAROXOLYN TAKE 1 TABLET BY MOUTH ONCE DAILY 20  MINUTES PRIOR TO FUROSEMIDE   neomycin-polymyxin-hydrocortisone otic solution Commonly known as:  CORTISPORIN INSTILL 3 DROPS IN BOTH EARS THREE TIMES DAILY   nitroGLYCERIN 0.4 MG SL tablet Commonly known as:  NITROSTAT Place 1 tablet (0.4 mg total) under the tongue every 5 (five) minutes as needed for chest pain.   NORVIR 100 MG Tabs tablet Generic drug:  ritonavir TAKE 1 TABLET(100 MG) BY MOUTH DAILY   omega-3 acid ethyl esters 1 g capsule Commonly known as:  LOVAZA Take 1 capsule (1 g total) by mouth 2 (two) times daily.   pantoprazole 40 MG tablet Commonly known as:  PROTONIX TAKE 1 TABLET(40 MG) BY MOUTH DAILY   pioglitazone 15 MG tablet Commonly known as:  ACTOS TAKE 1 TABLET(15 MG) BY MOUTH DAILY   potassium chloride SA 20 MEQ tablet Commonly known as:  K-DUR,KLOR-CON TAKE 1 TABLET(20 MEQ) BY MOUTH THREE TIMES DAILY   PREZISTA 800 MG tablet Generic drug:  darunavir TAKE 1 TABLET(800 MG) BY MOUTH DAILY   PROBIOTIC DAILY Caps Take 1 by mouth daily   repaglinide 2 MG tablet Commonly known as:  PRANDIN TAKE 2 TABLETS BY MOUTH BEFORE BREAKFAST, 1 TABLET BY MOUTH BEFORE LUNCH AND 2 TABLETS BY MOUTH BEFORE SUPPER   rilpivirine 25 MG Tabs tablet Commonly known as:  EDURANT Take 1 tablet (25 mg total) by mouth daily with breakfast.   sodium bicarbonate 650 MG tablet Take 650 mg by mouth 3 (three) times daily.   TIVICAY 50 MG tablet Generic drug:  dolutegravir TAKE 1 TABLET BY MOUTH DAILY   triamcinolone cream 0.1 % Commonly known as:  KENALOG APP TO DRY ITCHY SKIN BID PRN   valACYclovir 500 MG tablet Commonly known as:  VALTREX Take 1 tablet (500 mg total) by mouth daily.       Allergies: No Known Allergies  Past Medical History:  Diagnosis Date  . Arthritis   . CAD (coronary  artery disease)    2003- [facility]g  . Chronic kidney disease   . Constipation 05/28/2014  . COPD (chronic obstructive pulmonary disease) ([place])   . Dermatitis 11/27/2012  . Diabetes mellitus   . Epicondylitis 06/05/2013   right  . GERD (gastroesophageal reflux disease)   . History of depression   . History of kidney stones   . HIV (human immunodeficiency virus infection) ([place]) 1991   on meds since initial dx.   . Hyperlipidemia   . Hypertension   . Nasal abscess 12/04/2015  . Nocturia 03/06/2013  . Pain in joint, ankle and foot 01/19/2015  . Pancreatitis 11/2013   attributed to HIV meds.     Past Surgical History:  Procedure Laterality Date  . CARDIAC CATHETERIZATION N/A 07/29/2015   Procedure: Left Heart Cath and Coronary Angiography;  Surgeon: [name], MD;  Location: [facility];  Service: Cardiovascular;  Laterality: N/A;  . CARDIAC CATHETERIZATION N/A 07/29/2015   Procedure: Coronary Stent Intervention;  Surgeon: [name], MD;  Location: [facility];  Service: Cardiovascular;  Laterality: N/A;  . CORONARY ARTERY BYPASS GRAFT  05/2002  . VASECTOMY      Family History  Problem Relation Age of Onset  . Hyperlipidemia Mother   . Diabetes Mother     paternal grandparents/1 brother  . Hyperlipidemia Father   . Hypertension Father     paternal grandmother/3 brothers/1 sister  . Arthritis      mother/father/paternal grandparents  . Breast cancer Maternal Aunt     paternal aunt  .  Lung cancer Maternal Aunt   . Heart disease      parents/maternal grandparents/ 2 brothers  . Stroke Paternal Grandmother   . Mental retardation Sister     Social History:  reports that he has never smoked. He has never used smokeless tobacco. He reports that he does not drink alcohol or use drugs.    Review of Systems    Lipid history: On Lipitor 10 mg, followed by PCP His triglycerides are Relatively better However he is currently out of his fish oil supplement, despite  instructions he does not take more than 1 a day   Lab Results  Component Value Date   CHOL 135 11/04/2016   HDL 30.80 (L) 11/04/2016   LDLCALC 59 10/15/2015   LDLDIRECT 72.0 11/04/2016   TRIG 212.0 (H) 11/04/2016   CHOLHDL 4 11/04/2016            RENAL dysfunction: Followed by nephrologist,This is mild  and stable He has history of edema  Potassium back to normal He was prescribed oral diclofenac last year, not clear if he is taking this now   Lab Results  Component Value Date   CREATININE 1.41 11/04/2016   BUN 30 (H) 11/04/2016   NA 141 11/04/2016   K 4.0 11/04/2016   CL 104 11/04/2016   CO2 29 11/04/2016     Hypertension: Has been treated with various drugs in the past, Recently taking amlodipine and 5 mg lisinopril Had edema with 10 mg amlodipine   BP Readings from Last 3 Encounters:  11/07/16 108/60  10/14/16 (!) 104/56  09/26/16 120/66    Diabetic foot exam  in 09/2016 shows normal monofilament sensation in the toes and plantar surfaces, no skin lesions or ulcers on the feet and absent pedal pulses    Physical Examination:  BP 108/60   Pulse 72   Ht 5\' 3"  (1.6 m)   Wt 135 lb (61.2 kg)   BMI 23.91 kg/m     ASSESSMENT:  Diabetes type 2, uncontrolled with BMI 23; has  abdominal obesity   See history of present illness for detailed discussion of current diabetes management, blood sugar patterns and problems identified  His Blood sugars are improving significantly with even 1000 mg of metformin started on his last visit Since his creatinine clearance is 53 he can continue this With this his blood sugars are starting get low even with taking only 9 units of basal insulin  HIGH triglycerides: Improved However since he is not regular with taking his fish oil will have him try Lovaza 2 g daily by prescription  HYPOKALEMIA: Resolved  Renal dysfunction: Stable, he should try and avoid diclofenac  PLAN:  Stop insulin for now Start improving diet  especially at breakfast with less carbohydrate and more protein He will need to let us know if his blood sugars continually go up in the morning and most likely if needed  he can start back with 5 units of Toujeo Discussed blood sugar targets at various times He does need to check more readings after meals Discussed blood sugar targets both fasting and postprandial and he needs to let us know if he has consistently high or low readings Continue exercising  We will continue same dose of Prandin at breakfast and supper   Patient Instructions  Stop Insulin and start 5 units only if am sugar stays >140  Check blood sugars on waking up  3x weekly  Also check blood sugars about 2 hours after a meal  and do this after different meals by rotation  Recommended blood sugar levels on waking up is 90-130 and about 2 hours after meal is 130-160  Please bring your blood sugar monitor to each visit, thank you     Total visit time for a evaluation and management of multiple medical problems, labs, medication reconciliation and counseling = 25 minutes   Lupita Rosales 11/07/2016, 3:15 PM   Note: This office note was prepared with Dragon voice recognition system technology. Any transcriptional errors that result from this process are unintentional.

## 2016-11-11 ENCOUNTER — Encounter: Payer: Self-pay | Admitting: Adult Health

## 2016-11-11 ENCOUNTER — Ambulatory Visit (INDEPENDENT_AMBULATORY_CARE_PROVIDER_SITE_OTHER): Payer: Medicare Other | Admitting: Adult Health

## 2016-11-11 VITALS — BP 112/60 | HR 76 | Temp 97.5°F | Wt 138.8 lb

## 2016-11-11 DIAGNOSIS — E11319 Type 2 diabetes mellitus with unspecified diabetic retinopathy without macular edema: Secondary | ICD-10-CM | POA: Diagnosis not present

## 2016-11-11 DIAGNOSIS — E782 Mixed hyperlipidemia: Secondary | ICD-10-CM | POA: Diagnosis not present

## 2016-11-11 DIAGNOSIS — I10 Essential (primary) hypertension: Secondary | ICD-10-CM

## 2016-11-11 DIAGNOSIS — Z7689 Persons encountering health services in other specified circumstances: Secondary | ICD-10-CM | POA: Diagnosis not present

## 2016-11-11 DIAGNOSIS — B20 Human immunodeficiency virus [HIV] disease: Secondary | ICD-10-CM

## 2016-11-11 NOTE — Progress Notes (Signed)
Patient presents to clinic today to establish care. He is a pleasant 71 year old male who  has a past medical history of Arthritis; CAD (coronary artery disease); Chronic kidney disease; Constipation (05/28/2014); COPD (chronic obstructive pulmonary disease) ([place]); Dermatitis (11/27/2012); Diabetes mellitus; Epicondylitis (06/05/2013); GERD (gastroesophageal reflux disease); History of depression; History of kidney stones; HIV (human immunodeficiency virus infection) ([place]) (1991); Hyperlipidemia; Hypertension; Nasal abscess (12/04/2015); Nocturia (03/06/2013); Pain in joint, ankle and foot (01/19/2015); and Pancreatitis (11/2013).   Acute Concerns: Establish Care  Chronic Issues: Essential Hypertension - Well controlled on lisinopril, amlodipine, metolazone, and lasix.   HIV - followed by ID. Last CD4 count was 500 in August 2017   Diabetes Mellitus - Toujeo 10 units daily, Metformin 1000mg  ER daily, Actos 15 mg  Renal Failure - Followed by Nephrologist. Appears mild at the current time   Health Maintenance: Dental -- Routine  Vision -- Routine  Immunizations -- UTD  Colonoscopy -- 2013 - He is due this year   Is Followed by:   ID - Daniel Nones  Endocrinology - Dr. Dwyane Dee Cardiology - Dr. Stanford Breed  Nephrology - Elmarie Shiley  GI - Dr. Ardis Hughs.   He has no acute complaints today, except he would like to see a podiatrist in [place]    Past Medical History:  Diagnosis Date  . Arthritis   . CAD (coronary artery disease)    2003- cabg  . Chronic kidney disease   . Constipation 05/28/2014  . COPD (chronic obstructive pulmonary disease) ([place])   . Dermatitis 11/27/2012  . Diabetes mellitus   . Epicondylitis 06/05/2013   right  . GERD (gastroesophageal reflux disease)   . History of depression   . History of kidney stones   . HIV (human immunodeficiency virus infection) ([place]) 1991   on meds since initial dx.   . Hyperlipidemia   . Hypertension   . Nasal abscess 12/04/2015  .  Nocturia 03/06/2013  . Pain in joint, ankle and foot 01/19/2015  . Pancreatitis 11/2013   attributed to HIV meds.     Past Surgical History:  Procedure Laterality Date  . CARDIAC CATHETERIZATION N/A 07/29/2015   Procedure: Left Heart Cath and Coronary Angiography;  Surgeon: [name], MD;  Location: [facility];  Service: Cardiovascular;  Laterality: N/A;  . CARDIAC CATHETERIZATION N/A 07/29/2015   Procedure: Coronary Stent Intervention;  Surgeon: [name], MD;  Location: [facility];  Service: Cardiovascular;  Laterality: N/A;  . CORONARY ARTERY BYPASS GRAFT  05/2002  . VASECTOMY      Current Outpatient Prescriptions on File Prior to Visit  Medication Sig Dispense Refill  . amLODipine (NORVASC) 10 MG tablet Take 1 tablet (10 mg total) by mouth at bedtime. (Patient taking differently: Take 5 mg by mouth at bedtime. ) 90 tablet 1  . aspirin 81 MG chewable tablet Chew 81 mg by mouth daily.    Marland Kitchen atorvastatin (LIPITOR) 20 MG tablet Take 1 tablet (20 mg total) by mouth daily. 90 tablet 2  . b complex vitamins tablet Take 1 tablet by mouth daily. 30 tablet 11  . Cholecalciferol 2000 units CAPS Take 1 capsule (2,000 Units total) by mouth daily. 30 each 11  . diclofenac (VOLTAREN) 50 MG EC tablet TAKE 1 TABLET(50 MG) BY MOUTH TWICE DAILY 180 tablet 0  . furosemide (LASIX) 20 MG tablet TAKE 2 TABLETS BY MOUTH DAILY AS NEEDED FOR FLUID RETENTION OR SWELLING 60 tablet 0  . glucose blood (  FREESTYLE TEST STRIPS) test strip Use three times daily to check blood sugar.  DX E11.9 100 each 6  . Insulin Glargine (TOUJEO SOLOSTAR) 300 UNIT/ML SOPN Inject 10 Units into the skin daily. (Patient taking differently: Inject 9 Units into the skin daily. ) 3 pen 3  . Insulin Pen Needle (B-D UF III MINI PEN NEEDLES) 31G X 5 MM MISC 1 each by Does not apply route 2 (two) times daily. 100 each 1  . Krill Oil 300 MG CAPS Take 1 capsule (300 mg total) by mouth daily. 30 capsule 11  . Lancets  (FREESTYLE) lancets Use as directed three times a day to check blood sugar.  DX E11.9 100 each 11  . lisinopril (PRINIVIL,ZESTRIL) 5 MG tablet TAKE 1/2 TABLET BY MOUTH DAILY 45 tablet 0  . metFORMIN (GLUCOPHAGE-XR) 500 MG 24 hr tablet Take 2 tablets (1,000 mg total) by mouth daily with supper. 60 tablet 3  . metolazone (ZAROXOLYN) 2.5 MG tablet TAKE 1 TABLET BY MOUTH ONCE DAILY 20 MINUTES PRIOR TO FUROSEMIDE 90 tablet 0  . neomycin-polymyxin-hydrocortisone (CORTISPORIN) otic solution INSTILL 3 DROPS IN BOTH EARS THREE TIMES DAILY 10 mL 0  . nitroGLYCERIN (NITROSTAT) 0.4 MG SL tablet Place 1 tablet (0.4 mg total) under the tongue every 5 (five) minutes as needed for chest pain. 25 tablet 3  . NORVIR 100 MG TABS tablet TAKE 1 TABLET(100 MG) BY MOUTH DAILY 30 tablet 11  . omega-3 acid ethyl esters (LOVAZA) 1 g capsule Take 1 capsule (1 g total) by mouth 2 (two) times daily. 120 capsule 2  . pantoprazole (PROTONIX) 40 MG tablet TAKE 1 TABLET(40 MG) BY MOUTH DAILY 30 tablet 9  . pioglitazone (ACTOS) 15 MG tablet TAKE 1 TABLET(15 MG) BY MOUTH DAILY 30 tablet 0  . potassium chloride SA (K-DUR,KLOR-CON) 20 MEQ tablet TAKE 1 TABLET(20 MEQ) BY MOUTH THREE TIMES DAILY 90 tablet 0  . PREZISTA 800 MG tablet TAKE 1 TABLET(800 MG) BY MOUTH DAILY 30 tablet 11  . Probiotic Product (PROBIOTIC DAILY) CAPS Take 1 by mouth daily 30 capsule 11  . repaglinide (PRANDIN) 2 MG tablet TAKE 2 TABLETS BY MOUTH BEFORE BREAKFAST, 1 TABLET BY MOUTH BEFORE LUNCH AND 2 TABLETS BY MOUTH BEFORE SUPPER 450 tablet 0  . rilpivirine (EDURANT) 25 MG TABS tablet Take 1 tablet (25 mg total) by mouth daily with breakfast. 30 tablet 5  . sodium bicarbonate 650 MG tablet Take 650 mg by mouth 3 (three) times daily.    Marland Kitchen TIVICAY 50 MG tablet TAKE 1 TABLET BY MOUTH DAILY 30 tablet 5  . triamcinolone cream (KENALOG) 0.1 % APP TO DRY ITCHY SKIN BID PRN  2  . valACYclovir (VALTREX) 500 MG tablet Take 1 tablet (500 mg total) by mouth daily. 30 tablet  5   No current facility-administered medications on file prior to visit.     No Known Allergies  Family History  Problem Relation Age of Onset  . Hyperlipidemia Mother   . Diabetes Mother     paternal grandparents/1 brother  . Hyperlipidemia Father   . Hypertension Father     paternal grandmother/3 brothers/1 sister  . Arthritis      mother/father/paternal grandparents  . Breast cancer Maternal Aunt     paternal aunt  . Lung cancer Maternal Aunt   . Heart disease      parents/maternal grandparents/ 2 brothers  . Stroke Paternal Grandmother   . Mental retardation Sister     Social History   Social  History  . Marital status: Divorced    Spouse name: N/A  . Number of children: 4  . Years of education: N/A   Occupational History  . 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    Social History Main Topics  . Smoking status: Never Smoker  . Smokeless tobacco: Never Used  . Alcohol use No  . Drug use: No  . Sexual activity: Not Currently     Comment: declined condoms   Other Topics Concern  . Not on file   Social History Narrative   Lives alone.  Supportive friends and family.  His HIV Dx is not a secret.     Review of Systems  Constitutional: Negative.   HENT: Negative.   Eyes: Negative.   Respiratory: Negative.   Cardiovascular: Negative.   Gastrointestinal: Negative.   Genitourinary: Negative.   Musculoskeletal: Positive for joint pain.  Skin: Negative.   Neurological: Negative.   Endo/Heme/Allergies: Negative.   Psychiatric/Behavioral: Negative.   All other systems reviewed and are negative.   BP 112/60 (BP Location: Left Arm, Patient Position: Sitting, Cuff Size: Normal)   Pulse 76   Temp 97.5 F (36.4 C) (Oral)   Wt 138 lb 12.8 oz (63 kg)   SpO2 99%   BMI 24.59 kg/m   Physical Exam  Constitutional: He is oriented to person, place, and time and well-developed, well-nourished, and in no distress. No distress.    HENT:  Head: Normocephalic and atraumatic.  Right Ear: External ear normal.  Left Ear: External ear normal.  Nose: Nose normal.  Mouth/Throat: Oropharynx is clear and moist.  Eyes: Conjunctivae and EOM are normal. Pupils are equal, round, and reactive to light. Right eye exhibits no discharge. Left eye exhibits no discharge. No scleral icterus.  Cardiovascular: Normal rate, regular rhythm, normal heart sounds and intact distal pulses.  Exam reveals no gallop and no friction rub.   No murmur heard. Pulmonary/Chest: Effort normal and breath sounds normal. No respiratory distress. He has no wheezes. He has no rales. He exhibits no tenderness.  Musculoskeletal: Normal range of motion. He exhibits no edema, tenderness or deformity.  Neurological: He is alert and oriented to person, place, and time. He has normal reflexes. He displays normal reflexes. No cranial nerve deficit. He exhibits normal muscle tone. Gait normal. Coordination normal. GCS score is 15.  Skin: Skin is warm and dry. No rash noted. He is not diaphoretic. No erythema. No pallor.  Psychiatric: Mood, memory, affect and judgment normal.  Nursing note and vitals reviewed.   Recent Results (from the past 2160 hour(s))  Hemoglobin A1c     Status: Abnormal   Collection Time: 09/22/16  8:55 AM  Result Value Ref Range   Hgb A1c MFr Bld 7.6 (H) 4.6 - 6.5 %    Comment: Glycemic Control Guidelines for People with Diabetes:Non Diabetic:  <6%Goal of Therapy: <7%Additional Action Suggested:  >7%   Basic metabolic panel     Status: Abnormal   Collection Time: 09/22/16  8:55 AM  Result Value Ref Range   Sodium 137 135 - 145 mEq/L   Potassium 3.3 (L) 3.5 - 5.1 mEq/L   Chloride 100 96 - 112 mEq/L   CO2 26 19 - 32 mEq/L   Glucose, Bld 180 (H) 70 - 99 mg/dL   BUN 36 (H) 6 - 23 mg/dL   Creatinine, Ser 1.49 0.40 - 1.50 mg/dL   Calcium 9.4 8.4 - 10.5 mg/dL  GFR 49.80 (L) >60.00 mL/min  Basic metabolic panel     Status: Abnormal    Collection Time: 11/04/16  9:09 AM  Result Value Ref Range   Sodium 141 135 - 145 mEq/L   Potassium 4.0 3.5 - 5.1 mEq/L   Chloride 104 96 - 112 mEq/L   CO2 29 19 - 32 mEq/L   Glucose, Bld 159 (H) 70 - 99 mg/dL   BUN 30 (H) 6 - 23 mg/dL   Creatinine, Ser 1.41 0.40 - 1.50 mg/dL   Calcium 9.5 8.4 - 10.5 mg/dL   GFR 53.05 (L) >60.00 mL/min  Fructosamine     Status: None   Collection Time: 11/04/16  9:09 AM  Result Value Ref Range   Fructosamine 270 0 - 285 umol/L    Comment: Published reference interval for apparently healthy subjects between age 54 and 68 is 60 - 285 umol/L and in a poorly controlled diabetic population is 228 - 563 umol/L with a mean of 396 umol/L.   Lipid panel     Status: Abnormal   Collection Time: 11/04/16  9:09 AM  Result Value Ref Range   Cholesterol 135 0 - 200 mg/dL    Comment: ATP III Classification       Desirable:  < 200 mg/dL               Borderline High:  200 - 239 mg/dL          High:  > = 240 mg/dL   Triglycerides 212.0 (H) 0.0 - 149.0 mg/dL    Comment: Normal:  <150 mg/dLBorderline High:  150 - 199 mg/dL   HDL 30.80 (L) >39.00 mg/dL   VLDL 42.4 (H) 0.0 - 40.0 mg/dL   Total CHOL/HDL Ratio 4     Comment:                Men          Women1/2 Average Risk     3.4          3.3Average Risk          5.0          4.42X Average Risk          9.6          7.13X Average Risk          15.0          11.0                       NonHDL 103.95     Comment: NOTE:  Non-HDL goal should be 30 mg/dL higher than patient's LDL goal (i.e. LDL goal of < 70 mg/dL, would have non-HDL goal of < 100 mg/dL)  LDL cholesterol, direct     Status: None   Collection Time: 11/04/16  9:09 AM  Result Value Ref Range   Direct LDL 72.0 mg/dL    Comment: Optimal:  <100 mg/dLNear or Above Optimal:  100-129 mg/dLBorderline High:  130-159 mg/dLHigh:  160-189 mg/dLVery High:  >190 mg/dL    Assessment/Plan:  1. Encounter to establish care - Follow up as needed - Follow a diabetic diet  and exercise   2. Essential hypertension - Well controlled on current medications - Heart healthy diet and exercise as tolerated  3. 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  - Encouraged a diabetic diet   4. Mixed hyperlipidemia - heart healthy diet and exercise   5. Human immunodeficiency virus (HIV) disease ([place]) - Appears to be well controlled.  -  Continue follow up with ID   Dorothyann Peng, NP

## 2016-11-11 NOTE — Progress Notes (Signed)
Pre visit review using our clinic review tool, if applicable. No additional management support is needed unless otherwise documented below in the visit note. 

## 2016-11-11 NOTE — Patient Instructions (Signed)
WE NOW OFFER   [facility] FAST TRACK!!!  SAME DAY Appointments for ACUTE CARE  Such as: Sprains, Injuries, cuts, abrasions, rashes, muscle pain, joint pain, back pain Colds, flu, sore throats, headache, allergies, cough, fever  Ear pain, sinus and eye infections Abdominal pain, nausea, vomiting, diarrhea, upset stomach Animal/insect bites  3 Easy Ways to Schedule: Walk-In Scheduling Call in scheduling Mychart Sign-up: [email or web address]         

## 2016-11-13 ENCOUNTER — Other Ambulatory Visit: Payer: Self-pay | Admitting: Internal Medicine

## 2016-11-13 DIAGNOSIS — B029 Zoster without complications: Secondary | ICD-10-CM

## 2016-11-20 ENCOUNTER — Other Ambulatory Visit: Payer: Self-pay | Admitting: Adult Health

## 2016-11-20 NOTE — Telephone Encounter (Signed)
Ok to refill for one year  

## 2016-11-20 NOTE — Telephone Encounter (Signed)
OK to refill

## 2016-11-22 ENCOUNTER — Inpatient Hospital Stay (HOSPITAL_COMMUNITY)
Admission: EM | Admit: 2016-11-22 | Discharge: 2016-11-25 | DRG: 286 | Disposition: A | Discharge status: Home / Self Care | Payer: Medicare Other | Attending: Cardiology | Admitting: Cardiology

## 2016-11-22 ENCOUNTER — Other Ambulatory Visit: Payer: Self-pay

## 2016-11-22 ENCOUNTER — Encounter (HOSPITAL_COMMUNITY): Payer: Self-pay

## 2016-11-22 ENCOUNTER — Emergency Department (HOSPITAL_COMMUNITY): Payer: Medicare Other

## 2016-11-22 DIAGNOSIS — B20 Human immunodeficiency virus [HIV] disease: Secondary | ICD-10-CM | POA: Diagnosis present

## 2016-11-22 DIAGNOSIS — E785 Hyperlipidemia, unspecified: Secondary | ICD-10-CM | POA: Diagnosis present

## 2016-11-22 DIAGNOSIS — I2510 Atherosclerotic heart disease of native coronary artery without angina pectoris: Secondary | ICD-10-CM | POA: Diagnosis present

## 2016-11-22 DIAGNOSIS — I25810 Atherosclerosis of coronary artery bypass graft(s) without angina pectoris: Secondary | ICD-10-CM | POA: Diagnosis present

## 2016-11-22 DIAGNOSIS — Z79899 Other long term (current) drug therapy: Secondary | ICD-10-CM | POA: Diagnosis not present

## 2016-11-22 DIAGNOSIS — Z951 Presence of aortocoronary bypass graft: Secondary | ICD-10-CM

## 2016-11-22 DIAGNOSIS — E876 Hypokalemia: Secondary | ICD-10-CM | POA: Diagnosis present

## 2016-11-22 DIAGNOSIS — E1122 Type 2 diabetes mellitus with diabetic chronic kidney disease: Secondary | ICD-10-CM | POA: Diagnosis present

## 2016-11-22 DIAGNOSIS — I200 Unstable angina: Secondary | ICD-10-CM

## 2016-11-22 DIAGNOSIS — Z7982 Long term (current) use of aspirin: Secondary | ICD-10-CM

## 2016-11-22 DIAGNOSIS — Z87442 Personal history of urinary calculi: Secondary | ICD-10-CM

## 2016-11-22 DIAGNOSIS — E782 Mixed hyperlipidemia: Secondary | ICD-10-CM | POA: Diagnosis present

## 2016-11-22 DIAGNOSIS — R079 Chest pain, unspecified: Secondary | ICD-10-CM | POA: Insufficient documentation

## 2016-11-22 DIAGNOSIS — I252 Old myocardial infarction: Secondary | ICD-10-CM

## 2016-11-22 DIAGNOSIS — J449 Chronic obstructive pulmonary disease, unspecified: Secondary | ICD-10-CM | POA: Diagnosis present

## 2016-11-22 DIAGNOSIS — Z823 Family history of stroke: Secondary | ICD-10-CM

## 2016-11-22 DIAGNOSIS — F329 Major depressive disorder, single episode, unspecified: Secondary | ICD-10-CM | POA: Diagnosis present

## 2016-11-22 DIAGNOSIS — Z833 Family history of diabetes mellitus: Secondary | ICD-10-CM | POA: Diagnosis not present

## 2016-11-22 DIAGNOSIS — I25110 Atherosclerotic heart disease of native coronary artery with unstable angina pectoris: Principal | ICD-10-CM | POA: Diagnosis present

## 2016-11-22 DIAGNOSIS — Z794 Long term (current) use of insulin: Secondary | ICD-10-CM | POA: Diagnosis not present

## 2016-11-22 DIAGNOSIS — K219 Gastro-esophageal reflux disease without esophagitis: Secondary | ICD-10-CM | POA: Diagnosis present

## 2016-11-22 DIAGNOSIS — Z9861 Coronary angioplasty status: Secondary | ICD-10-CM

## 2016-11-22 DIAGNOSIS — N183 Chronic kidney disease, stage 3 (moderate): Secondary | ICD-10-CM | POA: Diagnosis not present

## 2016-11-22 DIAGNOSIS — I129 Hypertensive chronic kidney disease with stage 1 through stage 4 chronic kidney disease, or unspecified chronic kidney disease: Secondary | ICD-10-CM | POA: Diagnosis present

## 2016-11-22 DIAGNOSIS — E1139 Type 2 diabetes mellitus with other diabetic ophthalmic complication: Secondary | ICD-10-CM | POA: Diagnosis present

## 2016-11-22 DIAGNOSIS — Z8249 Family history of ischemic heart disease and other diseases of the circulatory system: Secondary | ICD-10-CM | POA: Diagnosis not present

## 2016-11-22 DIAGNOSIS — R0789 Other chest pain: Secondary | ICD-10-CM | POA: Diagnosis not present

## 2016-11-22 DIAGNOSIS — I10 Essential (primary) hypertension: Secondary | ICD-10-CM | POA: Diagnosis present

## 2016-11-22 DIAGNOSIS — R072 Precordial pain: Secondary | ICD-10-CM | POA: Diagnosis not present

## 2016-11-22 HISTORY — DX: Chest pain, unspecified: R07.9

## 2016-11-22 LAB — BASIC METABOLIC PANEL
ANION GAP: 13 (ref 5–15)
BUN: 37 mg/dL — ABNORMAL HIGH (ref 6–20)
CHLORIDE: 102 mmol/L (ref 101–111)
CO2: 22 mmol/L (ref 22–32)
Calcium: 8.9 mg/dL (ref 8.9–10.3)
Creatinine, Ser: 1.74 mg/dL — ABNORMAL HIGH (ref 0.61–1.24)
GFR, EST AFRICAN AMERICAN: 45 mL/min — AB (ref >60)
GFR, EST NON AFRICAN AMERICAN: 39 mL/min — AB (ref >60)
Glucose, Bld: 215 mg/dL — ABNORMAL HIGH (ref 65–99)
POTASSIUM: 3.5 mmol/L (ref 3.5–5.1)
SODIUM: 137 mmol/L (ref 135–145)

## 2016-11-22 LAB — CBC
HEMATOCRIT: 35.3 % — AB (ref 39.0–52.0)
HEMOGLOBIN: 12 g/dL — AB (ref 13.0–17.0)
MCH: 31.7 pg (ref 26.0–34.0)
MCHC: 34 g/dL (ref 30.0–36.0)
MCV: 93.4 fL (ref 78.0–100.0)
Platelets: 180 10*3/uL (ref 150–400)
RBC: 3.78 MIL/uL — AB (ref 4.22–5.81)
RDW: 15.1 % (ref 11.5–15.5)
WBC: 4.7 10*3/uL (ref 4.0–10.5)

## 2016-11-22 LAB — GLUCOSE, CAPILLARY: Glucose-Capillary: 156 mg/dL — ABNORMAL HIGH (ref 65–99)

## 2016-11-22 LAB — I-STAT TROPONIN, ED: Troponin i, poc: 0 ng/mL (ref 0.00–0.08)

## 2016-11-22 LAB — TROPONIN I

## 2016-11-22 MED ORDER — ASPIRIN EC 81 MG PO TBEC
81.0000 mg | DELAYED_RELEASE_TABLET | Freq: Every day | ORAL | Status: DC
  Administered 2016-11-23: 81 mg via ORAL
  Filled 2016-11-22: qty 1

## 2016-11-22 MED ORDER — RILPIVIRINE HCL 25 MG PO TABS
25.0000 mg | ORAL_TABLET | Freq: Every day | ORAL | Status: DC
  Administered 2016-11-23 – 2016-11-25 (×2): 25 mg via ORAL
  Filled 2016-11-22 (×3): qty 1

## 2016-11-22 MED ORDER — SODIUM BICARBONATE 650 MG PO TABS
650.0000 mg | ORAL_TABLET | Freq: Three times a day (TID) | ORAL | Status: DC
  Administered 2016-11-22 – 2016-11-25 (×6): 650 mg via ORAL
  Filled 2016-11-22 (×6): qty 1

## 2016-11-22 MED ORDER — VALACYCLOVIR HCL 500 MG PO TABS
500.0000 mg | ORAL_TABLET | Freq: Every day | ORAL | Status: DC
Start: 2016-11-23 — End: 2016-11-25
  Administered 2016-11-23 – 2016-11-25 (×2): 500 mg via ORAL
  Filled 2016-11-22 (×2): qty 1

## 2016-11-22 MED ORDER — METOPROLOL TARTRATE 12.5 MG HALF TABLET
12.5000 mg | ORAL_TABLET | Freq: Two times a day (BID) | ORAL | Status: DC
  Administered 2016-11-22 – 2016-11-25 (×5): 12.5 mg via ORAL
  Filled 2016-11-22 (×5): qty 1

## 2016-11-22 MED ORDER — NITROGLYCERIN 0.4 MG SL SUBL: 0.4000 mg | SUBLINGUAL_TABLET | SUBLINGUAL | Status: DC | PRN

## 2016-11-22 MED ORDER — DOLUTEGRAVIR SODIUM 50 MG PO TABS
50.0000 mg | ORAL_TABLET | Freq: Every day | ORAL | Status: DC
Start: 2016-11-23 — End: 2016-11-25
  Administered 2016-11-23 – 2016-11-25 (×2): 50 mg via ORAL
  Filled 2016-11-22 (×3): qty 1

## 2016-11-22 MED ORDER — HEPARIN BOLUS VIA INFUSION
3500.0000 [IU] | Freq: Once | INTRAVENOUS | Status: AC
  Administered 2016-11-22: 3500 [IU] via INTRAVENOUS
  Filled 2016-11-22: qty 3500

## 2016-11-22 MED ORDER — RITONAVIR 100 MG PO TABS
100.0000 mg | ORAL_TABLET | Freq: Every day | ORAL | Status: DC
  Administered 2016-11-23 – 2016-11-25 (×2): 100 mg via ORAL
  Filled 2016-11-22 (×3): qty 1

## 2016-11-22 MED ORDER — ASPIRIN 81 MG PO CHEW
324.0000 mg | CHEWABLE_TABLET | Freq: Once | ORAL | Status: AC
  Administered 2016-11-22: 324 mg via ORAL
  Filled 2016-11-22: qty 4

## 2016-11-22 MED ORDER — ATORVASTATIN CALCIUM 20 MG PO TABS
20.0000 mg | ORAL_TABLET | Freq: Every day | ORAL | Status: DC
  Administered 2016-11-22 – 2016-11-23 (×2): 20 mg via ORAL
  Filled 2016-11-22 (×3): qty 1

## 2016-11-22 MED ORDER — AMLODIPINE BESYLATE 5 MG PO TABS
5.0000 mg | ORAL_TABLET | Freq: Every day | ORAL | Status: DC
  Administered 2016-11-22 – 2016-11-24 (×3): 5 mg via ORAL
  Filled 2016-11-22 (×3): qty 1

## 2016-11-22 MED ORDER — INSULIN ASPART 100 UNIT/ML ~~LOC~~ SOLN
0.0000 [IU] | Freq: Three times a day (TID) | SUBCUTANEOUS | Status: DC
  Administered 2016-11-25: 2 [IU] via SUBCUTANEOUS
  Administered 2016-11-25: 1 [IU] via SUBCUTANEOUS

## 2016-11-22 MED ORDER — ONDANSETRON HCL 4 MG/2ML IJ SOLN: 4.0000 mg | Freq: Four times a day (QID) | INTRAMUSCULAR | Status: DC | PRN

## 2016-11-22 MED ORDER — DARUNAVIR ETHANOLATE 800 MG PO TABS
800.0000 mg | ORAL_TABLET | Freq: Every day | ORAL | Status: DC
  Administered 2016-11-23 – 2016-11-25 (×2): 800 mg via ORAL
  Filled 2016-11-22 (×3): qty 1

## 2016-11-22 MED ORDER — HEPARIN (PORCINE) IN NACL 100-0.45 UNIT/ML-% IJ SOLN
1100.0000 [IU]/h | INTRAMUSCULAR | Status: DC
  Administered 2016-11-22: 750 [IU]/h via INTRAVENOUS
  Administered 2016-11-23: 1100 [IU]/h via INTRAVENOUS
  Filled 2016-11-22 (×2): qty 250

## 2016-11-22 MED ORDER — PANTOPRAZOLE SODIUM 40 MG PO TBEC: 40.0000 mg | DELAYED_RELEASE_TABLET | Freq: Every day | ORAL | Status: DC

## 2016-11-22 NOTE — H&P (Signed)
Primary cardiologist: Dr Stanford Breed  HPI: 71 year old male with past medical history of coronary artery disease status post coronary artery bypass graft, hypertension, hyperlipidemia chronic stage III kidney disease, COPD, diabetes mellitus, HIV with unstable angina. Patient is status post coronary artery bypass and graft in 2003. Admitted in December 2016 with an acute infarct. Cardiac catheterization revealed severe three-vessel coronary disease. Saphenous vein graft to the diagonal had an 85% lesion. LIMA to the LAD patent. Sequential saphenous vein graft to the ramus and first appears marginal normal. Sequential saphenous vein graft to the PDA and posterior lateral had a 99% stenosis in the distal graft. Patient had PCI of the saphenous vein graft to the PDA and posterior lateral. Echocardiogram December 2016 showed normal LV function, grade 2 diastolic dysfunction, mild aortic stenosis, mild mitral regurgitation and mild left atrial enlargement. Patient had brief chest pain earlier in the week. Today after carrying clay pots he developed substernal chest pressure radiating to his jaws. There was associated dyspnea but no nausea or diaphoresis. He sat down and later took nitroglycerin and his pain resolved. Total duration of pain was 20 minutes. He is presently pain-free. He states his symptoms were similar to his prior infarct pain but less severe. He has some dyspnea on exertion and minimal pedal edema at the end of the day but no other cardiac symptoms.    (Not in a hospital admission)  No Known Allergies   Past Medical History:  Diagnosis Date  . Arthritis   . CAD (coronary artery disease)    2003- cabg  . Chronic kidney disease   . Constipation 05/28/2014  . COPD (chronic obstructive pulmonary disease) ([place])   . Dermatitis 11/27/2012  . Diabetes mellitus   . Epicondylitis 06/05/2013   right  . GERD (gastroesophageal reflux disease)   . History of depression   . History of kidney  stones   . HIV (human immunodeficiency virus infection) ([place]) 1991   on meds since initial dx.   . Hyperlipidemia   . Hypertension   . Nasal abscess 12/04/2015  . Pancreatitis 11/2013   attributed to HIV meds.     Past Surgical History:  Procedure Laterality Date  . CARDIAC CATHETERIZATION N/A 07/29/2015   Procedure: Left Heart Cath and Coronary Angiography;  Surgeon: [name], MD;  Location: [facility];  Service: Cardiovascular;  Laterality: N/A;  . CARDIAC CATHETERIZATION N/A 07/29/2015   Procedure: Coronary Stent Intervention;  Surgeon: [name], MD;  Location: [facility];  Service: Cardiovascular;  Laterality: N/A;  . CORONARY ARTERY BYPASS GRAFT  05/2002  . VASECTOMY      Social History   Social History  . Marital status: Divorced    Spouse name: N/A  . Number of children: 4  . Years of education: N/A   Occupational History  . 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    Social History Main Topics  . Smoking status: Never Smoker  . Smokeless tobacco: Never Used  . Alcohol use 0.0 oz/week     Comment: rare  . Drug use: No  . Sexual activity: Not Currently     Comment: declined condoms   Other Topics Concern  . Not on file   Social History Narrative   Lives alone.  Supportive friends and family.  His HIV Dx is not a secret.     Family History  Problem Relation Age of Onset  .  Hyperlipidemia Mother   . Diabetes Mother     paternal grandparents/1 brother  . Hyperlipidemia Father   . Hypertension Father     paternal grandmother/3 brothers/1 sister  . Arthritis      mother/father/paternal grandparents  . Breast cancer Maternal Aunt     paternal aunt  . Lung cancer Maternal Aunt   . Heart disease      parents/maternal grandparents/ 2 brothers  . Stroke Paternal Grandmother   . Mental retardation Sister     ROS:  Minimal pedal edema but no fevers or chills, productive cough, hemoptysis,  dysphasia, odynophagia, melena, hematochezia, dysuria, hematuria, rash, seizure activity, orthopnea, PND, claudication. Remaining systems are negative.  Physical Exam:   Blood pressure 125/74, pulse 78, temperature 98.1 F (36.7 C), temperature source Oral, resp. rate 14, height 5\' 3"  (1.6 m), weight 137 lb (62.1 kg), SpO2 99 %.  General:  Well developed/well nourished in NAD Skin warm/dry Patient not depressed No peripheral clubbing Back-normal HEENT-normal/normal eyelids Neck supple/normal carotid upstroke bilaterally; no bruits; no JVD; no thyromegaly chest - CTA/ normal expansion CV - RRR/normal S1 and S2; no rubs or gallops;  PMI nondisplaced; 2/6 systolic murmur LSB Abdomen -NT/ND, no HSM, no mass, + bowel sounds, no bruit; umbilical hernia 2+ femoral pulses, no bruits Ext-no edema, chords; diminished distal pulses Neuro-grossly nonfocal  ECG -normal sinus rhythm, cannot rule out prior anterior infarct versus lead reversal, prior inferior infarct, prolonged QT interval, nonspecific ST changes. personally reviewed  Results for orders placed or performed during the hospital encounter of 11/22/16 (from the past 48 hour(s))  CBC     Status: Abnormal   Collection Time: 11/22/16  3:37 PM  Result Value Ref Range   WBC 4.7 4.0 - 10.5 K/uL   RBC 3.78 (L) 4.22 - 5.81 MIL/uL   Hemoglobin 12.0 (L) 13.0 - 17.0 g/dL   HCT 35.3 (L) 39.0 - 52.0 %   MCV 93.4 78.0 - 100.0 fL   MCH 31.7 26.0 - 34.0 pg   MCHC 34.0 30.0 - 36.0 g/dL   RDW 15.1 11.5 - 15.5 %   Platelets 180 150 - 400 K/uL  I-stat troponin, ED     Status: None   Collection Time: 11/22/16  3:48 PM  Result Value Ref Range   Troponin i, poc 0.00 0.00 - 0.08 ng/mL   Comment 3            Comment: Due to the release kinetics of cTnI, a negative result within the first hours of the onset of symptoms does not rule out myocardial infarction with certainty. If myocardial infarction is still suspected, repeat the test at appropriate  intervals.    *Note: Due to a large number of results and/or encounters for the requested time period, some results have not been displayed. A complete set of results can be found in Results Review.    Dg Chest 2 View  Result Date: 11/22/2016 CLINICAL DATA:  Chest pain. EXAM: CHEST  2 VIEW COMPARISON:  05/06/2016 FINDINGS: Sequelae of prior CABG are again identified. The cardiac silhouette is upper limits of normal in size. Minimal streaky left basilar opacity likely represents atelectasis or scarring. The lungs are otherwise clear. No pleural effusion or pneumothorax is identified. No acute osseous abnormality is seen. IMPRESSION: Minimal left basilar opacity, likely scarring or atelectasis. Electronically Signed   By: Logan Bores M.D.   On: 11/22/2016 16:09    Assessment/Plan 1 unstable angina-patient presents with recurrent symptoms that are identical  to his previous infarct pain. He is presently pain-free. Will admit and cycle enzymes. Treat with aspirin, heparin and add metoprolol 12.5 mg twice a day. Continue statin. Given presentation I feel definitive evaluation is warranted. We will plan cardiac catheterization. The risks and benefits including contrast nephropathy, myocardial infarction, CVA and death discussed and he agrees to proceed. He does have baseline renal insufficiency. Hold diuretics. Hold ACE inhibitor. Proceed with gentle hydration Sunday evening prior to catheterization. No ventriculogram. Check echo for LV function. Hold Glucophage for 48 hours following procedure.  2 chronic stage III kidney disease-patient will be at risk for contrast nephropathy. Plan as outlined above. Follow renal function closely after procedure.  3 hyperlipidemia-continue statin.  4 Hypertension-blood pressure is controlled at present. Continue preadmission medications but hold ACE inhibitor and diuretics prior to catheterization.   5 History of mild aortic stenosis-still sounds mild on exam.  Echocardiogram to assess LV function but will also assess aortic stenosis.   6 HIV-continue preadmission medications.  Kirk Ruths MD 11/22/2016, 5:00 PM

## 2016-11-22 NOTE — Progress Notes (Signed)
ANTICOAGULATION CONSULT NOTE - Initial Consult  Pharmacy Consult for Heparin Indication: chest pain/ACS  No Known Allergies  Patient Measurements: Height: 5\' 3"  (160 cm) Weight: 133 lb 9.6 oz (60.6 kg) IBW/kg (Calculated) : 56.9 Heparin Dosing Weight: 60.6 kg  Vital Signs: Temp: 98.5 F (36.9 C) (04/21 2153) Temp Source: Oral (04/21 2153) BP: 124/70 (04/21 2153) Pulse Rate: 79 (04/21 2153)  Labs:  Recent Labs  11/22/16 1537 11/22/16 1919  HGB 12.0*  --   HCT 35.3*  --   PLT 180  --   CREATININE 1.74*  --   TROPONINI  --  <0.03    Estimated Creatinine Clearance: 32.7 mL/min (A) (by C-G formula based on SCr of 1.74 mg/dL (H)).   Medical History: Past Medical History:  Diagnosis Date  . Arthritis   . CAD (coronary artery disease)    2003- cabg  . Chronic kidney disease   . Constipation 05/28/2014  . COPD (chronic obstructive pulmonary disease) ([place])   . Dermatitis 11/27/2012  . Diabetes mellitus   . Epicondylitis 06/05/2013   right  . GERD (gastroesophageal reflux disease)   . History of depression   . History of kidney stones   . HIV (human immunodeficiency virus infection) ([place]) 1991   on meds since initial dx.   . Hyperlipidemia   . Hypertension   . Nasal abscess 12/04/2015  . Pancreatitis 11/2013   attributed to HIV meds.     Assessment: 78 YOM with significant cardiac history including CAD sp CABG in '03 with recurrent 3vCAD s/p PCI who presented on 4/21 with chest pain. Pharmacy was consulted to start heparin for [place]/CP while awaiting cardiac cath for further evaluation. Hep Wt: 60.6 kg, Hgb 12, plts wnl.  Goal of Therapy:  Heparin level 0.3-0.7 units/ml Monitor platelets by anticoagulation protocol: Yes   Plan:  1. Heparin 3500 unit bolus x 1 2. Start Heparin at 750 units/hr (7.5 ml/hr) 3. Daily HL/CBC 4. Will continue to monitor for any signs/symptoms of bleeding and will follow up with heparin level in 6 hours   Thank you for allowing  pharmacy to be a part of this patient's care.  Alycia Rossetti, PharmD, BCPS Clinical Pharmacist Pager: [phone] 11/22/2016 10:02 PM

## 2016-11-22 NOTE — ED Notes (Signed)
Dr Stanford Breed at bedside

## 2016-11-22 NOTE — Progress Notes (Signed)
  Received a page from patient's daughter Marshell Dilauro [phone] asking for clarification regarding her father's planned cath.   I told her that he was admitted for [place]. And there is a cath planned for Monday. I also suggested that she be present for rounds in am if she would like to discuss further.  Glori Bickers, MD  5:52 PM

## 2016-11-22 NOTE — ED Notes (Signed)
The pt is c/o upper mid-chest pain since 1430 today with some sob,  Hx of mi cabg and stent placement  The pain is just  There at present.  He takes aspirin every day at night

## 2016-11-22 NOTE — ED Notes (Signed)
Pt in xray

## 2016-11-22 NOTE — ED Provider Notes (Signed)
[facility] DEPT Provider Note   CSN: [id number] Arrival date & time: 11/22/16  1518     History   Chief Complaint Chief Complaint  Patient presents with  . Chest Pain    HPI [name] is a 71 y.o. male.  HPI   Presents with chest pain. Reports that he was helping a friend lift a flowerpot, when he suddenly developed retrosternal chest pressure without radiation, and with associated shortness of breath. Reports that he rested and took a nitroglycerin, and his pain that was initially 9 out of 10, decreased, and now he is chest pain-free. Reports that the pain lasted about 20 minutes. Reports it felt similar to his prior MI, not as severe, but "it got my attention." Reports chronic mild swelling in his legs which is unchanged, no nausea vomiting, no diaphoresis. Reports that he did feel hot at that time. Continues to be chest pain-free after nitroglycerin at home. No pain with arm movements.   Past Medical History:  Diagnosis Date  . Arthritis   . CAD (coronary artery disease)    2003- cabg  . Chronic kidney disease   . Constipation 05/28/2014  . COPD (chronic obstructive pulmonary disease) ([place])   . Dermatitis 11/27/2012  . Diabetes mellitus   . Epicondylitis 06/05/2013   right  . GERD (gastroesophageal reflux disease)   . History of depression   . History of kidney stones   . HIV (human immunodeficiency virus infection) ([place]) 1991   on meds since initial dx.   . Hyperlipidemia   . Hypertension   . Nasal abscess 12/04/2015  . Pancreatitis 11/2013   attributed to HIV meds.     Patient Active Problem List   Diagnosis Date Noted  . Chest pain 11/22/2016  . Aortic stenosis 09/17/2015  . STEMI (ST elevation myocardial infarction) ([place]) 07/29/2015  . Subsequent ST elevation (STEMI) myocardial infarction of inferior wall ([place]) 07/29/2015  . Lesion of breast 07/21/2015  . Carotid bruit 07/10/2015  . Metatarsal deformity 01/24/2015  . Keratoma 01/24/2015  . Pain in  joint, ankle and foot 01/19/2015  . Absolute anemia 05/28/2014  . Constipation 05/28/2014  . Pedal edema 05/21/2014  . Posterior cervical lymphadenopathy 03/30/2014  . Hyperkalemia 03/23/2014  . Hyponatremia 03/23/2014  . Protein-calorie malnutrition, severe ([place]) 03/23/2014  . Diarrhea 11/20/2013  . HLD (hyperlipidemia) 11/19/2013  . Ascites 11/18/2013  . Pancreatitis 11/15/2013  . Epicondylitis 06/05/2013  . Nocturia 03/06/2013  . Dermatitis 11/27/2012  . Night sweats 07/06/2012  . Erectile dysfunction 01/01/2012  . Lipodystrophy 12/20/2010  . Dry eye syndrome 12/20/2010  . BELLS PALSY 07/19/2010  . GENITAL HERPES 05/03/2009  . SHINGLES, HX OF 05/03/2009  . WEIGHT LOSS, ABNORMAL 04/03/2009  . INGUINAL LYMPHADENOPATHY, RIGHT 04/03/2009  . MEMORY LOSS 09/18/2008  . PERIPHERAL VASCULAR DISEASE 07/20/2008  . COPD 07/20/2008  . HIP PAIN, BILATERAL 07/17/2008  . KNEE PAIN, BILATERAL 07/17/2008  . HEMATOCHEZIA 04/18/2008  . NEPHROLITHIASIS, HX OF 02/22/2008  . DM (diabetes mellitus), type 2 with ophthalmic complications ([place]) 63/78/5885  . DEPRESSION 09/03/2006  . GERD 09/03/2006  . CORONARY ARTERY BYPASS GRAFT, HX OF 09/03/2006  . Human immunodeficiency virus (HIV) disease ([place]) 05/12/2006  . HEARING LOSS, SENSORINEURAL 05/12/2006  . Essential hypertension 05/12/2006  . Coronary atherosclerosis 05/12/2006    Past Surgical History:  Procedure Laterality Date  . CARDIAC CATHETERIZATION N/A 07/29/2015   Procedure: Left Heart Cath and Coronary Angiography;  Surgeon: [name], MD;  Location: [facility];  Service: Cardiovascular;  Laterality: N/A;  . CARDIAC CATHETERIZATION N/A 07/29/2015   Procedure: Coronary Stent Intervention;  Surgeon: [name], MD;  Location: [facility];  Service: Cardiovascular;  Laterality: N/A;  . CORONARY ARTERY BYPASS GRAFT  05/2002  . VASECTOMY         Home Medications    Prior to Admission medications   Medication  Sig Start Date End Date Taking? Authorizing Provider  amLODipine (NORVASC) 10 MG tablet Take 1 tablet (10 mg total) by mouth at bedtime. Patient taking differently: Take 5 mg by mouth at bedtime.  02/13/16  Yes Mosie Lukes, MD  aspirin 81 MG chewable tablet Chew 81 mg by mouth daily.   Yes Historical Provider, MD  atorvastatin (LIPITOR) 20 MG tablet Take 1 tablet (20 mg total) by mouth daily. 09/15/16  Yes Lelon Perla, MD  b complex vitamins tablet Take 1 tablet by mouth daily. 09/25/16  Yes Mosie Lukes, MD  Cholecalciferol 2000 units CAPS Take 1 capsule (2,000 Units total) by mouth daily. 09/25/16  Yes Mosie Lukes, MD  diclofenac (VOLTAREN) 50 MG EC tablet TAKE 1 TABLET(50 MG) BY MOUTH TWICE DAILY 06/25/16  Yes Mosie Lukes, MD  furosemide (LASIX) 20 MG tablet TAKE 2 TABLETS BY MOUTH DAILY AS NEEDED FOR FLUID RETENTION OR SWELLING 03/17/16  Yes Mosie Lukes, MD  glucose blood (FREESTYLE TEST STRIPS) test strip Use three times daily to check blood sugar.  DX E11.9 03/17/16  Yes Mosie Lukes, MD  Insulin Pen Needle (B-D UF III MINI PEN NEEDLES) 31G X 5 MM MISC 1 each by Does not apply route 2 (two) times daily. 06/11/16  Yes Elayne Snare, MD  Krill Oil 300 MG CAPS Take 1 capsule (300 mg total) by mouth daily. 09/25/16  Yes Mosie Lukes, MD  Lancets (FREESTYLE) lancets Use as directed three times a day to check blood sugar.  DX E11.9 02/25/16  Yes Mosie Lukes, MD  lisinopril (PRINIVIL,ZESTRIL) 5 MG tablet TAKE 1/2 TABLET BY MOUTH DAILY Patient taking differently: 2.5mg  by mount every other day 11/03/16  Yes Mosie Lukes, MD  metFORMIN (GLUCOPHAGE-XR) 500 MG 24 hr tablet Take 2 tablets (1,000 mg total) by mouth daily with supper. 09/26/16  Yes Elayne Snare, MD  metolazone (ZAROXOLYN) 2.5 MG tablet TAKE 1 TABLET BY MOUTH ONCE DAILY 20 MINUTES PRIOR TO FUROSEMIDE 11/07/16  Yes Mosie Lukes, MD  neomycin-polymyxin-hydrocortisone (CORTISPORIN) otic solution INSTILL 3 DROPS IN BOTH EARS THREE  TIMES DAILY 07/07/16  Yes Mosie Lukes, MD  nitroGLYCERIN (NITROSTAT) 0.4 MG SL tablet Place 1 tablet (0.4 mg total) under the tongue every 5 (five) minutes as needed for chest pain. 09/29/16  Yes Lelon Perla, MD  NORVIR 100 MG TABS tablet TAKE 1 TABLET(100 MG) BY MOUTH DAILY 05/14/16  Yes Michel Bickers, MD  omega-3 acid ethyl esters (LOVAZA) 1 g capsule Take 1 capsule (1 g total) by mouth 2 (two) times daily. 11/07/16  Yes Elayne Snare, MD  pantoprazole (PROTONIX) 40 MG tablet TAKE 1 TABLET(40 MG) BY MOUTH DAILY 08/18/16  Yes Lelon Perla, MD  pioglitazone (ACTOS) 15 MG tablet TAKE 1 TABLET(15 MG) BY MOUTH DAILY 11/05/16  Yes Elayne Snare, MD  potassium chloride SA (K-DUR,KLOR-CON) 20 MEQ tablet TAKE 1 TABLET(20 MEQ) BY MOUTH THREE TIMES DAILY 11/20/16  Yes Dorothyann Peng, NP  PREZISTA 800 MG tablet TAKE 1 TABLET(800 MG) BY MOUTH DAILY 06/12/16  Yes Michel Bickers, MD  Probiotic Product (PROBIOTIC DAILY) CAPS Take 1 by mouth daily 09/25/16  Yes Mosie Lukes, MD  repaglinide (PRANDIN) 2 MG tablet TAKE 2 TABLETS BY MOUTH BEFORE BREAKFAST, 1 TABLET BY MOUTH BEFORE LUNCH AND 2 TABLETS BY MOUTH BEFORE SUPPER 09/15/16  Yes Elayne Snare, MD  rilpivirine (EDURANT) 25 MG TABS tablet Take 1 tablet (25 mg total) by mouth daily with breakfast. 10/07/16  Yes Michel Bickers, MD  sodium bicarbonate 650 MG tablet Take 650 mg by mouth 3 (three) times daily.   Yes Historical Provider, MD  TIVICAY 50 MG tablet TAKE 1 TABLET BY MOUTH DAILY 10/07/16  Yes Michel Bickers, MD  triamcinolone cream (KENALOG) 0.1 % APP TO DRY ITCHY SKIN BID PRN 10/15/15  Yes Historical Provider, MD  valACYclovir (VALTREX) 500 MG tablet TAKE 1 TABLET BY MOUTH DAILY 11/13/16  Yes Michel Bickers, MD  Insulin Glargine (TOUJEO SOLOSTAR) 300 UNIT/ML SOPN Inject 10 Units into the skin daily. Patient not taking: Reported on 11/22/2016 06/11/16   Elayne Snare, MD    Family History Family History  Problem Relation Age of Onset  . Hyperlipidemia Mother   . Diabetes  Mother     paternal grandparents/1 brother  . Hyperlipidemia Father   . Hypertension Father     paternal grandmother/3 brothers/1 sister  . Arthritis      mother/father/paternal grandparents  . Breast cancer Maternal Aunt     paternal aunt  . Lung cancer Maternal Aunt   . Heart disease      parents/maternal grandparents/ 2 brothers  . Stroke Paternal Grandmother   . Mental retardation Sister     Social History Social History  Substance Use Topics  . Smoking status: Never Smoker  . Smokeless tobacco: Never Used  . Alcohol use 0.0 oz/week     Comment: rare     Allergies   Patient has no known allergies.   Review of Systems Review of Systems  Constitutional: Positive for fatigue. Negative for fever. Diaphoresis: felt hot but no sweating.  HENT: Negative for sore throat.   Eyes: Negative for visual disturbance.  Respiratory: Positive for shortness of breath.   Cardiovascular: Positive for chest pain.  Gastrointestinal: Negative for abdominal pain, nausea and vomiting.  Genitourinary: Negative for difficulty urinating.  Musculoskeletal: Negative for back pain and neck stiffness.  Skin: Negative for rash.  Neurological: Negative for syncope and headaches.     Physical Exam Updated Vital Signs BP 130/69   Pulse 74   Temp 98.5 F (36.9 C) (Oral)   Resp 18   Ht 5\' 3"  (1.6 m)   Wt 133 lb 9.6 oz (60.6 kg)   SpO2 100%   BMI 23.67 kg/m   Physical Exam  Constitutional: He is oriented to person, place, and time. He appears well-developed and well-nourished. No distress.  HENT:  Head: Normocephalic and atraumatic.  Eyes: Conjunctivae and EOM are normal.  Neck: Normal range of motion.  Cardiovascular: Normal rate, regular rhythm, normal heart sounds and intact distal pulses.  Exam reveals no gallop and no friction rub.   No murmur heard. Pulmonary/Chest: Effort normal and breath sounds normal. No respiratory distress. He has no wheezes. He has no rales.  Abdominal:  Soft. He exhibits no distension. There is no tenderness. There is no guarding.  Musculoskeletal: He exhibits no edema.  Neurological: He is alert and oriented to person, place, and time.  Skin: Skin is warm and dry. He is not diaphoretic.  Nursing note and vitals reviewed.  ED Treatments / Results  Labs (all labs ordered are listed, but only abnormal results are displayed) Labs Reviewed  BASIC METABOLIC PANEL - Abnormal; Notable for the following:       Result Value   Glucose, Bld 215 (*)    BUN 37 (*)    Creatinine, Ser 1.74 (*)    GFR calc non Af Amer 39 (*)    GFR calc Af Amer 45 (*)    All other components within normal limits  CBC - Abnormal; Notable for the following:    RBC 3.78 (*)    Hemoglobin 12.0 (*)    HCT 35.3 (*)    All other components within normal limits  GLUCOSE, CAPILLARY - Abnormal; Notable for the following:    Glucose-Capillary 156 (*)    All other components within normal limits  MRSA PCR SCREENING  TROPONIN I  TROPONIN I  TROPONIN I  HEMOGLOBIN J4N  BASIC METABOLIC PANEL  LIPID PANEL  HEPARIN LEVEL (UNFRACTIONATED)  CBC  I-STAT TROPOININ, ED    EKG  EKG Interpretation  Date/Time:  Saturday November 22 2016 15:22:55 EDT Ventricular Rate:  88 PR Interval:  180 QRS Duration: 92 QT Interval:  392 QTC Calculation: 474 R Axis:   52 Text Interpretation:  Normal sinus rhythm Possible Inferior infarct , age undetermined Anterior infarct , age undetermined Abnormal ECG No significant change since last tracing Confirmed by FLOYD MD, Quillian Quince [phone]) on 11/22/2016 3:34:52 PM Also confirmed by Tyrone Nine MD, DANIEL [phone]), editor [place] CT, Leda Gauze [phone])  on 11/22/2016 3:58:00 PM Also confirmed by [facility] MD, [place] ([id number])  on 11/22/2016 4:17:26 PM       Radiology Dg Chest 2 View  Result Date: 11/22/2016 CLINICAL DATA:  Chest pain. EXAM: CHEST  2 VIEW COMPARISON:  05/06/2016 FINDINGS: Sequelae of prior CABG are again identified. The cardiac silhouette is  upper limits of normal in size. Minimal streaky left basilar opacity likely represents atelectasis or scarring. The lungs are otherwise clear. No pleural effusion or pneumothorax is identified. No acute osseous abnormality is seen. IMPRESSION: Minimal left basilar opacity, likely scarring or atelectasis. Electronically Signed   By: Logan Bores M.D.   On: 11/22/2016 16:09    Procedures Procedures (including critical care time)  Medications Ordered in ED Medications  valACYclovir (VALTREX) tablet 500 mg (not administered)  rilpivirine (EDURANT) tablet 25 mg (not administered)  dolutegravir (TIVICAY) tablet 50 mg (not administered)  atorvastatin (LIPITOR) tablet 20 mg (20 mg Oral Given 11/22/16 2339)  darunavir (PREZISTA) tablet 800 mg (not administered)  ritonavir (NORVIR) tablet 100 mg (not administered)  amLODipine (NORVASC) tablet 5 mg (5 mg Oral Given 11/22/16 2312)  sodium bicarbonate tablet 650 mg (650 mg Oral Given 11/22/16 2312)  aspirin EC tablet 81 mg (not administered)  nitroGLYCERIN (NITROSTAT) SL tablet 0.4 mg (not administered)  ondansetron (ZOFRAN) injection 4 mg (not administered)  metoprolol tartrate (LOPRESSOR) tablet 12.5 mg (12.5 mg Oral Given 11/22/16 2312)  insulin aspart (novoLOG) injection 0-9 Units (not administered)  heparin ADULT infusion 100 units/mL (25000 units/233mL sodium chloride 0.45%) (750 Units/hr Intravenous New Bag/Given 11/22/16 2313)  aspirin chewable tablet 324 mg (324 mg Oral Given 11/22/16 1659)  heparin bolus via infusion 3,500 Units (3,500 Units Intravenous Bolus from Bag 11/22/16 2326)     Initial Impression / Assessment and Plan / ED Course  I have reviewed the triage vital signs and the nursing notes.  Pertinent labs & imaging results that were available during my care of the patient  were reviewed by me and considered in my medical decision making (see chart for details).     71 year old male with a history of coronary artery disease post CABG  in 2003, MI with left heart cath in 2016, diabetes, hypertension, hyperlipidemia, COPD, HIV, pancreatitis who presents with concern for chest pain.  EKG is unchanged from previous. Initial troponin is negative. Have low suspicion for pulmonary embolus given no asymmetric leg swelling, no tachycardia, no hypoxia. Normal pulses bilaterally.  Symptoms are typical of his prior anginal pain.  Given aspirin. [facility] for concern for new onset/reoccurance of angina.   Dr. Stanford Breed to admit patient for further care.   Final Clinical Impressions(s) / ED Diagnoses   Final diagnoses:  Chest pain with high risk for cardiac etiology    New Prescriptions Current Discharge Medication List       Gareth Morgan, MD 11/23/16 [phone]

## 2016-11-22 NOTE — ED Triage Notes (Signed)
Pt arrives POV with c/o chest pain and SHOB after lifting flower pots. Pt took nitro x1 and went from 9/10 to 4/10 but c/o SHOB. EKG done at triage.

## 2016-11-22 NOTE — ED Notes (Signed)
No chest pain on his cell almost constantly    Since he arrived

## 2016-11-23 ENCOUNTER — Inpatient Hospital Stay (HOSPITAL_COMMUNITY): Payer: Medicare Other

## 2016-11-23 DIAGNOSIS — R072 Precordial pain: Secondary | ICD-10-CM

## 2016-11-23 LAB — GLUCOSE, CAPILLARY
GLUCOSE-CAPILLARY: 173 mg/dL — AB (ref 65–99)
Glucose-Capillary: 118 mg/dL — ABNORMAL HIGH (ref 65–99)
Glucose-Capillary: 166 mg/dL — ABNORMAL HIGH (ref 65–99)

## 2016-11-23 LAB — MRSA PCR SCREENING: MRSA by PCR: NEGATIVE

## 2016-11-23 LAB — ECHOCARDIOGRAM COMPLETE
Height: 63 in
Weight: 2112 oz

## 2016-11-23 LAB — BASIC METABOLIC PANEL
ANION GAP: 13 (ref 5–15)
BUN: 29 mg/dL — AB (ref 6–20)
CHLORIDE: 100 mmol/L — AB (ref 101–111)
CO2: 25 mmol/L (ref 22–32)
Calcium: 9 mg/dL (ref 8.9–10.3)
Creatinine, Ser: 1.42 mg/dL — ABNORMAL HIGH (ref 0.61–1.24)
GFR calc non Af Amer: 49 mL/min — ABNORMAL LOW (ref >60)
GFR, EST AFRICAN AMERICAN: 57 mL/min — AB (ref >60)
Glucose, Bld: 121 mg/dL — ABNORMAL HIGH (ref 65–99)
Potassium: 3.4 mmol/L — ABNORMAL LOW (ref 3.5–5.1)
SODIUM: 138 mmol/L (ref 135–145)

## 2016-11-23 LAB — TROPONIN I: Troponin I: <0.03 ng/mL (ref <0.03)

## 2016-11-23 LAB — CBC
HEMATOCRIT: 39.4 % (ref 39.0–52.0)
HEMOGLOBIN: 13.4 g/dL (ref 13.0–17.0)
MCH: 31.8 pg (ref 26.0–34.0)
MCHC: 34 g/dL (ref 30.0–36.0)
MCV: 93.6 fL (ref 78.0–100.0)
Platelets: 208 10*3/uL (ref 150–400)
RBC: 4.21 MIL/uL — AB (ref 4.22–5.81)
RDW: 15 % (ref 11.5–15.5)
WBC: 6.1 10*3/uL (ref 4.0–10.5)

## 2016-11-23 LAB — HEPARIN LEVEL (UNFRACTIONATED)
HEPARIN UNFRACTIONATED: 0.18 [IU]/mL — AB (ref 0.30–0.70)
Heparin Unfractionated: 0.22 IU/mL — ABNORMAL LOW (ref 0.30–0.70)

## 2016-11-23 LAB — LIPID PANEL
CHOL/HDL RATIO: 5.3 ratio
CHOLESTEROL: 149 mg/dL (ref 0–200)
HDL: 28 mg/dL — AB (ref >40)
LDL Cholesterol: 73 mg/dL (ref 0–99)
TRIGLYCERIDES: 240 mg/dL — AB (ref <150)
VLDL: 48 mg/dL — ABNORMAL HIGH (ref 0–40)

## 2016-11-23 MED ORDER — POTASSIUM CHLORIDE CRYS ER 20 MEQ PO TBCR
40.0000 meq | EXTENDED_RELEASE_TABLET | Freq: Once | ORAL | Status: AC
  Administered 2016-11-23: 40 meq via ORAL
  Filled 2016-11-23: qty 2

## 2016-11-23 MED ORDER — SODIUM CHLORIDE 0.9 % IV SOLN: 250.0000 mL | INTRAVENOUS | Status: DC | PRN

## 2016-11-23 MED ORDER — ASPIRIN EC 81 MG PO TBEC
81.0000 mg | DELAYED_RELEASE_TABLET | Freq: Every day | ORAL | Status: DC
  Administered 2016-11-25: 81 mg via ORAL
  Filled 2016-11-23: qty 1

## 2016-11-23 MED ORDER — SODIUM CHLORIDE 0.9 % WEIGHT BASED INFUSION: 3.0000 mL/kg/h | INTRAVENOUS | Status: DC

## 2016-11-23 MED ORDER — SODIUM CHLORIDE 0.9 % WEIGHT BASED INFUSION: 1.0000 mL/kg/h | INTRAVENOUS | Status: DC

## 2016-11-23 MED ORDER — SODIUM CHLORIDE 0.9% FLUSH: 3.0000 mL | INTRAVENOUS | Status: DC | PRN

## 2016-11-23 MED ORDER — SODIUM CHLORIDE 0.9% FLUSH: 3.0000 mL | Freq: Two times a day (BID) | INTRAVENOUS | Status: DC

## 2016-11-23 MED ORDER — ASPIRIN 81 MG PO CHEW
81.0000 mg | CHEWABLE_TABLET | ORAL | Status: AC
  Administered 2016-11-24: 81 mg via ORAL
  Filled 2016-11-23: qty 1

## 2016-11-23 MED ORDER — SODIUM CHLORIDE 0.9 % WEIGHT BASED INFUSION
1.0000 mL/kg/h | INTRAVENOUS | Status: DC
  Administered 2016-11-24: 1 mL/kg/h via INTRAVENOUS

## 2016-11-23 MED ORDER — ASPIRIN 81 MG PO CHEW: 81.0000 mg | CHEWABLE_TABLET | ORAL | Status: DC

## 2016-11-23 MED ORDER — HEPARIN BOLUS VIA INFUSION
2000.0000 [IU] | Freq: Once | INTRAVENOUS | Status: AC
  Administered 2016-11-23: 2000 [IU] via INTRAVENOUS
  Filled 2016-11-23: qty 2000

## 2016-11-23 NOTE — Progress Notes (Signed)
ANTICOAGULATION CONSULT NOTE - Initial Consult  Pharmacy Consult for Heparin Indication: chest pain/ACS  No Known Allergies  Patient Measurements: Height: 5\' 3"  (160 cm) Weight: 132 lb (59.9 kg) IBW/kg (Calculated) : 56.9  Vital Signs: Temp: 98.1 F (36.7 C) (04/22 0642) Temp Source: Oral (04/22 0642) BP: 123/74 (04/22 0642) Pulse Rate: 73 (04/22 0642)  Labs:  Recent Labs  11/22/16 1537 11/22/16 1919 11/23/16 0052 11/23/16 0638  HGB 12.0*  --   --  13.4  HCT 35.3*  --   --  39.4  PLT 180  --   --  208  HEPARINUNFRC  --   --   --  0.18*  CREATININE 1.74*  --   --  1.42*  TROPONINI  --  <0.03 <0.03 <0.03    Estimated Creatinine Clearance: 40.1 mL/min (A) (by C-G formula based on SCr of 1.42 mg/dL (H)).   Medical History: Past Medical History:  Diagnosis Date  . Arthritis   . CAD (coronary artery disease)    2003- cabg  . Chronic kidney disease   . Constipation 05/28/2014  . COPD (chronic obstructive pulmonary disease) ([place])   . Dermatitis 11/27/2012  . Diabetes mellitus   . Epicondylitis 06/05/2013   right  . GERD (gastroesophageal reflux disease)   . History of depression   . History of kidney stones   . HIV (human immunodeficiency virus infection) ([place]) 1991   on meds since initial dx.   . Hyperlipidemia   . Hypertension   . Nasal abscess 12/04/2015  . Pancreatitis 11/2013   attributed to HIV meds.     Assessment: 59 YOM with significant cardiac history including CAD sp CABG in '03 with recurrent 3vCAD s/p PCI who presented on 4/21 with chest pain. Pharmacy was consulted to start heparin for [place]/CP while awaiting cardiac cath for further evaluation.  -0500 HL = 0.18, subtherapeutic  -CBC WNL  Goal of Therapy:  Heparin level 0.3-0.7 units/ml Monitor platelets by anticoagulation protocol: Yes   Plan:  1. Heparin 2000 unit bolus 2. Increase heparin to 950 units/hr 3. Daily HL/CBC 4. Monitor for any signs/symptoms of bleeding 5. Will follow up with  heparin level in 6 hours at 1430  Thank you for allowing pharmacy to be a part of this patient's care.  Myer Peer Grayland Ormond), PharmD  PGY1 Pharmacy Resident Pager: [phone] 11/23/2016 8:15 AM

## 2016-11-23 NOTE — Progress Notes (Signed)
Progress Note  Patient Name: [name] Date of Encounter: 11/23/2016  Primary Cardiologist: Dr Stanford Breed  Subjective   No chest pain or dyspnea  Inpatient Medications    Scheduled Meds: . amLODipine  5 mg Oral QHS  . aspirin EC  81 mg Oral Daily  . atorvastatin  20 mg Oral q1800  . darunavir  800 mg Oral Q breakfast  . dolutegravir  50 mg Oral Daily  . insulin aspart  0-9 Units Subcutaneous TID WC  . metoprolol tartrate  12.5 mg Oral BID  . rilpivirine  25 mg Oral Q breakfast  . ritonavir  100 mg Oral Q breakfast  . sodium bicarbonate  650 mg Oral TID  . valACYclovir  500 mg Oral Daily   Continuous Infusions: . heparin 750 Units/hr (11/22/16 2313)   PRN Meds: nitroGLYCERIN, ondansetron (ZOFRAN) IV   Vital Signs    Vitals:   11/22/16 2152 11/22/16 2153 11/22/16 2312 11/23/16 0642  BP:  124/70 130/69 123/74  Pulse:  79 74 73  Resp:  18  18  Temp:  98.5 F (36.9 C)  98.1 F (36.7 C)  TempSrc:  Oral  Oral  SpO2: 100% 100%  99%  Weight:  133 lb 9.6 oz (60.6 kg)  132 lb (59.9 kg)  Height:  5\' 3"  (1.6 m)      Intake/Output Summary (Last 24 hours) at 11/23/16 0810 Last data filed at 11/23/16 0646  Gross per 24 hour  Intake            28.38 ml  Output             1250 ml  Net         -1221.62 ml   Filed Weights   11/22/16 1528 11/22/16 2153 11/23/16 0642  Weight: 137 lb (62.1 kg) 133 lb 9.6 oz (60.6 kg) 132 lb (59.9 kg)    Telemetry    Sinus- Personally Reviewed  Physical Exam   GEN: No acute distress.   Neck: No JVD Cardiac: RRR, 2/6 systolic murur Respiratory: Clear to auscultation bilaterally; no wheeze GI: Soft, nontender, non-distended  MS: No edema Neuro:  Nonfocal  Psych: Normal affect   Labs    Chemistry Recent Labs Lab 11/22/16 1537 11/23/16 0638  NA 137 138  K 3.5 3.4*  CL 102 100*  CO2 22 25  GLUCOSE 215* 121*  BUN 37* 29*  CREATININE 1.74* 1.42*  CALCIUM 8.9 9.0  GFRNONAA 39* 49*  GFRAA 45* 57*  ANIONGAP 13 13       Hematology Recent Labs Lab 11/22/16 1537 11/23/16 0638  WBC 4.7 6.1  RBC 3.78* 4.21*  HGB 12.0* 13.4  HCT 35.3* 39.4  MCV 93.4 93.6  MCH 31.7 31.8  MCHC 34.0 34.0  RDW 15.1 15.0  PLT 180 208    Cardiac Enzymes Recent Labs Lab 11/22/16 1919 11/23/16 0052 11/23/16 0638  TROPONINI <0.03 <0.03 <0.03    Recent Labs Lab 11/22/16 1548  [place] 0.00     Radiology    Dg Chest 2 View  Result Date: 11/22/2016 CLINICAL DATA:  Chest pain. EXAM: CHEST  2 VIEW COMPARISON:  05/06/2016 FINDINGS: Sequelae of prior CABG are again identified. The cardiac silhouette is upper limits of normal in size. Minimal streaky left basilar opacity likely represents atelectasis or scarring. The lungs are otherwise clear. No pleural effusion or pneumothorax is identified. No acute osseous abnormality is seen. IMPRESSION: Minimal left basilar opacity, likely scarring or atelectasis. Electronically Signed  By: Logan Bores M.D.   On: 11/22/2016 16:09    Patient Profile     71 year old male with past medical history of coronary artery disease status post coronary artery bypass graft, hypertension, hyperlipidemia chronic stage III kidney disease, COPD, diabetes mellitus, HIV with unstable angina. Patient is status post coronary artery bypass and graft in 2003. Admitted in December 2016 with an acute infarct. Cardiac catheterization revealed severe three-vessel coronary disease. Saphenous vein graft to the diagonal had an 85% lesion. LIMA to the LAD patent. Sequential saphenous vein graft to the ramus and first appears marginal normal. Sequential saphenous vein graft to the PDA and posterior lateral had a 99% stenosis in the distal graft. Patient had PCI of the saphenous vein graft to the PDA and posterior lateral. Echocardiogram December 2016 showed normal LV function, grade 2 diastolic dysfunction, mild aortic stenosis, mild mitral regurgitation and mild left atrial enlargement.   Assessment & Plan    1  unstable angina-patient presents with recurrent symptoms that are identical to his previous infarct pain. He remains pain-free this AM. Enzymes negative. Continue aspirin, heparin and metoprolol 12.5 mg twice a day. Continue statin. Given presentation I feel definitive evaluation is warranted. Plan for cath in AM.The risks and benefits including contrast nephropathy, myocardial infarction, CVA and death discussed and he agrees to proceed. He does have baseline renal insufficiency. Continue to hold diuretics. Hold ACE inhibitor. Proceed with gentle hydration this evening prior to catheterization. No ventriculogram. Await echo for LV function. Hold Glucophage for 48 hours following procedure.  2 chronic stage III kidney disease-patient will be at risk for contrast nephropathy. Plan as outlined above. Follow renal function closely after procedure.  3 hyperlipidemia-continue statin.  4 Hypertension-blood pressure is controlled at present. Continue preadmission medications. Hold ACE inhibitor and diuretics prior to catheterization. Resume after  5 History of mild aortic stenosis-still sounds mild on exam. Echocardiogram pending to assess LV function and aortic stenosis.   6 HIV-continue preadmission medications.  7 Hypokalemia-supplement  Signed, Kirk Ruths, MD  11/23/2016, 8:10 AM

## 2016-11-23 NOTE — Progress Notes (Addendum)
ANTICOAGULATION CONSULT NOTE Pharmacy Consult for Heparin Indication: chest pain/ACS  No Known Allergies  Patient Measurements: Height: 5\' 3"  (160 cm) Weight: 132 lb (59.9 kg) IBW/kg (Calculated) : 56.9  Vital Signs: Temp: 98.1 F (36.7 C) (04/22 0642) Temp Source: Oral (04/22 0642) BP: 123/74 (04/22 0642) Pulse Rate: 73 (04/22 0642)  Labs:  Recent Labs  11/22/16 1537 11/22/16 1919 11/23/16 0052 11/23/16 0638  HGB 12.0*  --   --  13.4  HCT 35.3*  --   --  39.4  PLT 180  --   --  208  HEPARINUNFRC  --   --   --  0.18*  CREATININE 1.74*  --   --  1.42*  TROPONINI  --  <0.03 <0.03 <0.03    Estimated Creatinine Clearance: 40.1 mL/min (A) (by C-G formula based on SCr of 1.42 mg/dL (H)).   Medical History: Past Medical History:  Diagnosis Date  . Arthritis   . CAD (coronary artery disease)    2003- cabg  . Chronic kidney disease   . Constipation 05/28/2014  . COPD (chronic obstructive pulmonary disease) ([place])   . Dermatitis 11/27/2012  . Diabetes mellitus   . Epicondylitis 06/05/2013   right  . GERD (gastroesophageal reflux disease)   . History of depression   . History of kidney stones   . HIV (human immunodeficiency virus infection) ([place]) 1991   on meds since initial dx.   . Hyperlipidemia   . Hypertension   . Nasal abscess 12/04/2015  . Pancreatitis 11/2013   attributed to HIV meds.     Assessment: 42 YOM with significant cardiac history including CAD sp CABG in '03 with recurrent 3vCAD s/p PCI who presented on 4/21 with chest pain. Pharmacy was consulted to dose heparin for ACS while awaiting cardiac cath for further evaluation. Not on anticoagulation PTA.  Heparin level remains subtherapeutic (0.22) on rate increase to 950 units/h. No bleed or IV line issues reported per RN. CBC WNL.  Goal of Therapy:  Heparin level 0.3-0.7 units/ml Monitor platelets by anticoagulation protocol: Yes   Plan:  Increase heparin to 1100 units/hr 6h heparin level Daily  heparin level/CBC Monitor for any signs/symptoms of bleeding Cath for 4/23 AM   Elicia Lamp, PharmD, BCPS Clinical Pharmacist 11/23/2016 11:14 AM

## 2016-11-23 NOTE — Progress Notes (Signed)
*  PRELIMINARY RESULTS* Echocardiogram 2D Echocardiogram has been performed.  [name] 11/23/2016, 12:47 PM

## 2016-11-24 ENCOUNTER — Encounter (HOSPITAL_COMMUNITY)
Admission: EM | Disposition: A | Discharge status: Home / Self Care | Payer: Self-pay | Source: Home / Self Care | Attending: Cardiology

## 2016-11-24 DIAGNOSIS — I200 Unstable angina: Secondary | ICD-10-CM | POA: Diagnosis present

## 2016-11-24 DIAGNOSIS — N183 Chronic kidney disease, stage 3 (moderate): Secondary | ICD-10-CM

## 2016-11-24 DIAGNOSIS — I10 Essential (primary) hypertension: Secondary | ICD-10-CM

## 2016-11-24 DIAGNOSIS — I25110 Atherosclerotic heart disease of native coronary artery with unstable angina pectoris: Principal | ICD-10-CM

## 2016-11-24 HISTORY — DX: Unstable angina: I20.0

## 2016-11-24 HISTORY — PX: LEFT HEART CATH AND CORS/GRAFTS ANGIOGRAPHY: CATH118250

## 2016-11-24 LAB — CBC
HEMATOCRIT: 36 % — AB (ref 39.0–52.0)
Hemoglobin: 12.3 g/dL — ABNORMAL LOW (ref 13.0–17.0)
MCH: 31.8 pg (ref 26.0–34.0)
MCHC: 34.2 g/dL (ref 30.0–36.0)
MCV: 93 fL (ref 78.0–100.0)
Platelets: 184 10*3/uL (ref 150–400)
RBC: 3.87 MIL/uL — ABNORMAL LOW (ref 4.22–5.81)
RDW: 14.8 % (ref 11.5–15.5)
WBC: 5.8 10*3/uL (ref 4.0–10.5)

## 2016-11-24 LAB — GLUCOSE, CAPILLARY
GLUCOSE-CAPILLARY: 119 mg/dL — AB (ref 65–99)
GLUCOSE-CAPILLARY: 86 mg/dL (ref 65–99)
Glucose-Capillary: 128 mg/dL — ABNORMAL HIGH (ref 65–99)

## 2016-11-24 LAB — BASIC METABOLIC PANEL
ANION GAP: 11 (ref 5–15)
BUN: 29 mg/dL — ABNORMAL HIGH (ref 6–20)
CO2: 24 mmol/L (ref 22–32)
Calcium: 8.9 mg/dL (ref 8.9–10.3)
Chloride: 105 mmol/L (ref 101–111)
Creatinine, Ser: 1.42 mg/dL — ABNORMAL HIGH (ref 0.61–1.24)
GFR, EST AFRICAN AMERICAN: 57 mL/min — AB (ref >60)
GFR, EST NON AFRICAN AMERICAN: 49 mL/min — AB (ref >60)
GLUCOSE: 130 mg/dL — AB (ref 65–99)
POTASSIUM: 3.7 mmol/L (ref 3.5–5.1)
Sodium: 140 mmol/L (ref 135–145)

## 2016-11-24 LAB — HEPARIN LEVEL (UNFRACTIONATED)
Heparin Unfractionated: 0.35 IU/mL (ref 0.30–0.70)
Heparin Unfractionated: 0.39 IU/mL (ref 0.30–0.70)

## 2016-11-24 LAB — PROTIME-INR
INR: 1.04
PROTHROMBIN TIME: 13.6 s (ref 11.4–15.2)

## 2016-11-24 LAB — HEMOGLOBIN A1C
HEMOGLOBIN A1C: 6.3 % — AB (ref 4.8–5.6)
Mean Plasma Glucose: 134 mg/dL

## 2016-11-24 LAB — POCT ACTIVATED CLOTTING TIME: ACTIVATED CLOTTING TIME: 125 s

## 2016-11-24 SURGERY — LEFT HEART CATH AND CORS/GRAFTS ANGIOGRAPHY
Anesthesia: LOCAL

## 2016-11-24 MED ORDER — MIDAZOLAM HCL 2 MG/2ML IJ SOLN
INTRAMUSCULAR | Status: DC | PRN
  Administered 2016-11-24: 1 mg via INTRAVENOUS

## 2016-11-24 MED ORDER — SODIUM CHLORIDE 0.9% FLUSH
3.0000 mL | Freq: Two times a day (BID) | INTRAVENOUS | Status: DC
  Administered 2016-11-24 – 2016-11-25 (×2): 3 mL via INTRAVENOUS

## 2016-11-24 MED ORDER — MIDAZOLAM HCL 2 MG/2ML IJ SOLN
INTRAMUSCULAR | Status: AC
  Filled 2016-11-24: qty 2

## 2016-11-24 MED ORDER — SODIUM CHLORIDE 0.9% FLUSH: 3.0000 mL | INTRAVENOUS | Status: DC | PRN

## 2016-11-24 MED ORDER — HEPARIN (PORCINE) IN NACL 2-0.9 UNIT/ML-% IJ SOLN
INTRAMUSCULAR | Status: AC
  Filled 2016-11-24: qty 1000

## 2016-11-24 MED ORDER — FENTANYL CITRATE (PF) 100 MCG/2ML IJ SOLN
INTRAMUSCULAR | Status: AC
  Filled 2016-11-24: qty 2

## 2016-11-24 MED ORDER — SODIUM CHLORIDE 0.9 % IV SOLN: 250.0000 mL | INTRAVENOUS | Status: DC | PRN

## 2016-11-24 MED ORDER — SODIUM CHLORIDE 0.9 % IV SOLN
INTRAVENOUS | Status: AC
  Administered 2016-11-24: 22:00:00 via INTRAVENOUS

## 2016-11-24 MED ORDER — IOPAMIDOL (ISOVUE-370) INJECTION 76%
INTRAVENOUS | Status: DC | PRN
  Administered 2016-11-24: 65 mL via INTRA_ARTERIAL

## 2016-11-24 MED ORDER — FENTANYL CITRATE (PF) 100 MCG/2ML IJ SOLN
INTRAMUSCULAR | Status: DC | PRN
  Administered 2016-11-24: 25 ug via INTRAVENOUS

## 2016-11-24 MED ORDER — LIDOCAINE HCL (PF) 1 % IJ SOLN
INTRAMUSCULAR | Status: DC | PRN
  Administered 2016-11-24: 15 mL

## 2016-11-24 MED ORDER — HEPARIN (PORCINE) IN NACL 2-0.9 UNIT/ML-% IJ SOLN
INTRAMUSCULAR | Status: DC | PRN
  Administered 2016-11-24: 1000 mL

## 2016-11-24 MED ORDER — ISOSORBIDE MONONITRATE ER 30 MG PO TB24
30.0000 mg | ORAL_TABLET | Freq: Every day | ORAL | Status: DC
  Administered 2016-11-24 – 2016-11-25 (×2): 30 mg via ORAL
  Filled 2016-11-24 (×2): qty 1

## 2016-11-24 MED ORDER — HEPARIN SODIUM (PORCINE) 5000 UNIT/ML IJ SOLN
5000.0000 [IU] | Freq: Three times a day (TID) | INTRAMUSCULAR | Status: DC
  Administered 2016-11-25: 5000 [IU] via SUBCUTANEOUS
  Filled 2016-11-24: qty 1

## 2016-11-24 MED ORDER — LIDOCAINE HCL 1 % IJ SOLN
INTRAMUSCULAR | Status: AC
  Filled 2016-11-24: qty 20

## 2016-11-24 SURGICAL SUPPLY — 11 items
CATH INFINITI 5 FR IM (CATHETERS) ×1 IMPLANT
CATH INFINITI 5 FR MPA2 (CATHETERS) ×1 IMPLANT
CATH INFINITI 5FR MULTPACK ANG (CATHETERS) ×1 IMPLANT
GUIDEWIRE ANGLED .035X150CM (WIRE) ×1 IMPLANT
KIT HEART LEFT (KITS) ×2 IMPLANT
KIT MICROINTRODUCER STIFF 5F (SHEATH) ×1 IMPLANT
PACK CARDIAC CATHETERIZATION (CUSTOM PROCEDURE TRAY) ×2 IMPLANT
SHEATH PINNACLE 5F 10CM (SHEATH) ×1 IMPLANT
TRANSDUCER W/STOPCOCK (MISCELLANEOUS) ×2 IMPLANT
TUBING CIL FLEX 10 FLL-RA (TUBING) ×2 IMPLANT
WIRE EMERALD 3MM-J .035X150CM (WIRE) ×1 IMPLANT

## 2016-11-24 NOTE — Progress Notes (Signed)
Progress Note  Patient Name: [name] Date of Encounter: 11/24/2016  Primary Cardiologist: Dr Stanford Breed  Subjective   No chest pain or dyspnea. Feels well. States symptoms are similar to prior angina only not as intense.   Inpatient Medications    Scheduled Meds: . amLODipine  5 mg Oral QHS  . [START ON 11/25/2016] aspirin EC  81 mg Oral Daily  . atorvastatin  20 mg Oral q1800  . darunavir  800 mg Oral Q breakfast  . dolutegravir  50 mg Oral Daily  . insulin aspart  0-9 Units Subcutaneous TID WC  . metoprolol tartrate  12.5 mg Oral BID  . rilpivirine  25 mg Oral Q breakfast  . ritonavir  100 mg Oral Q breakfast  . sodium bicarbonate  650 mg Oral TID  . sodium chloride flush  3 mL Intravenous Q12H  . valACYclovir  500 mg Oral Daily   Continuous Infusions: . sodium chloride    . sodium chloride 1 mL/kg/hr (11/24/16 0901)  . heparin 1,100 Units/hr (11/23/16 2103)   PRN Meds: sodium chloride, nitroGLYCERIN, ondansetron (ZOFRAN) IV, sodium chloride flush   Vital Signs    Vitals:   11/23/16 0642 11/23/16 1328 11/23/16 2103 11/24/16 0435  BP: 123/74 107/62 125/67 126/70  Pulse: 73 72 73 68  Resp: 18 20 18 17   Temp: 98.1 F (36.7 C) 98.3 F (36.8 C) 98.5 F (36.9 C) 98.4 F (36.9 C)  TempSrc: Oral Oral Oral Oral  SpO2: 99% 97% 97% 99%  Weight: 132 lb (59.9 kg)     Height:        Intake/Output Summary (Last 24 hours) at 11/24/16 0907 Last data filed at 11/24/16 0851  Gross per 24 hour  Intake              242 ml  Output             2601 ml  Net            -2359 ml   Filed Weights   11/22/16 1528 11/22/16 2153 11/23/16 0642  Weight: 137 lb (62.1 kg) 133 lb 9.6 oz (60.6 kg) 132 lb (59.9 kg)    Telemetry    Sinus- Personally Reviewed  Physical Exam   GEN: WD BM No acute distress.   Neck: No JVD or carotid bruits.  Cardiac: RRR, 2/6 systolic murmur RUSB Respiratory: Clear to auscultation bilaterally; no wheeze GI: Soft, nontender, non-distended    MS: No edema, femoral, pedal, and radial pulses 2+. Neuro:  Nonfocal  Psych: Normal affect   Labs    Chemistry  Recent Labs Lab 11/22/16 1537 11/23/16 0638 11/24/16 0515  NA 137 138 140  K 3.5 3.4* 3.7  CL 102 100* 105  CO2 22 25 24   GLUCOSE 215* 121* 130*  BUN 37* 29* 29*  CREATININE 1.74* 1.42* 1.42*  CALCIUM 8.9 9.0 8.9  GFRNONAA 39* 49* 49*  GFRAA 45* 57* 57*  ANIONGAP 13 13 11      Hematology  Recent Labs Lab 11/22/16 1537 11/23/16 0638 11/24/16 0515  WBC 4.7 6.1 5.8  RBC 3.78* 4.21* 3.87*  HGB 12.0* 13.4 12.3*  HCT 35.3* 39.4 36.0*  MCV 93.4 93.6 93.0  MCH 31.7 31.8 31.8  MCHC 34.0 34.0 34.2  RDW 15.1 15.0 14.8  PLT 180 208 184    Cardiac Enzymes  Recent Labs Lab 11/22/16 1919 11/23/16 0052 11/23/16 0638  TROPONINI <0.03 <0.03 <0.03     Recent Labs Lab 11/22/16 1548  TROPIPOC 0.00     Echo 11/23/16: Study Conclusions  - Left ventricle: The cavity size was normal. There was mild   concentric hypertrophy. Systolic function was normal. The   estimated ejection fraction was in the range of 55% to 60%. There   is akinesis of the basalinferior and inferoseptal myocardium.   Features are consistent with a pseudonormal left ventricular   filling pattern, with concomitant abnormal relaxation and   increased filling pressure (grade 2 diastolic dysfunction). - Aortic valve: Trileaflet; severely calcified leaflets with   immobile right coronary cusp. - Mitral valve: Moderately calcified annulus. There was trivial   regurgitation.   Radiology    Dg Chest 2 View  Result Date: 11/22/2016 CLINICAL DATA:  Chest pain. EXAM: CHEST  2 VIEW COMPARISON:  05/06/2016 FINDINGS: Sequelae of prior CABG are again identified. The cardiac silhouette is upper limits of normal in size. Minimal streaky left basilar opacity likely represents atelectasis or scarring. The lungs are otherwise clear. No pleural effusion or pneumothorax is identified. No acute osseous  abnormality is seen. IMPRESSION: Minimal left basilar opacity, likely scarring or atelectasis. Electronically Signed   By: Logan Bores M.D.   On: 11/22/2016 16:09    Patient Profile     71 year old male with past medical history of coronary artery disease status post coronary artery bypass graft, hypertension, hyperlipidemia chronic stage III kidney disease, COPD, diabetes mellitus, HIV with unstable angina. Patient is status post coronary artery bypass and graft in 2003. Admitted in December 2016 with an acute infarct. Cardiac catheterization revealed severe three-vessel coronary disease. Saphenous vein graft to the diagonal patent but  had an 85% lesion distal to the SVG insertion. LIMA to the LAD patent. Sequential saphenous vein graft to the ramus and first appears marginal normal. Sequential saphenous vein graft to the PDA and posterior lateral had a 99% stenosis in the distal graft. Patient had PCI of the saphenous vein graft to the PDA and posterior lateral. Echocardiogram December 2016 showed normal LV function, grade 2 diastolic dysfunction, mild aortic stenosis, mild mitral regurgitation and mild left atrial enlargement.   Assessment & Plan    1. Unstable angina-patient presents with recurrent symptoms that are identical to his previous infarct pain. He remains pain-free. Enzymes negative. Continue aspirin, heparin and metoprolol 12.5 mg twice a day. Continue statin.  Plan for cath today. The risks and benefits including contrast nephropathy, myocardial infarction, CVA and death discussed and he agrees to proceed. He does have baseline renal insufficiency. Continue to hold diuretics. Hold ACE inhibitor. Proceed with gentle hydration this evening prior to catheterization. No ventriculogram.  Hold Glucophage for 48 hours following procedure. EF 50-55% with inferior wall motion abnormality by Echo.   2. Chronic stage III kidney disease-patient will be at risk for contrast nephropathy. Plan as  outlined above. Follow renal function closely after procedure.  3. Hyperlipidemia-continue statin.  4. Hypertension-blood pressure is controlled at present. Continue preadmission medications. Hold ACE inhibitor and diuretics prior to catheterization. Resume after  5. History of mild aortic stenosis- sounds mild on exam. Echocardiogram shows immobile right coronary cusp but no significant AS.   6 HIV-continue preadmission medications.    Signed, Seven Marengo [place], MD  11/24/2016, 9:07 AM

## 2016-11-24 NOTE — H&P (View-Only) (Signed)
Progress Note  Patient Name: [name] Date of Encounter: 11/24/2016  Primary Cardiologist: Dr Stanford Breed  Subjective   No chest pain or dyspnea. Feels well. States symptoms are similar to prior angina only not as intense.   Inpatient Medications    Scheduled Meds: . amLODipine  5 mg Oral QHS  . [START ON 11/25/2016] aspirin EC  81 mg Oral Daily  . atorvastatin  20 mg Oral q1800  . darunavir  800 mg Oral Q breakfast  . dolutegravir  50 mg Oral Daily  . insulin aspart  0-9 Units Subcutaneous TID WC  . metoprolol tartrate  12.5 mg Oral BID  . rilpivirine  25 mg Oral Q breakfast  . ritonavir  100 mg Oral Q breakfast  . sodium bicarbonate  650 mg Oral TID  . sodium chloride flush  3 mL Intravenous Q12H  . valACYclovir  500 mg Oral Daily   Continuous Infusions: . sodium chloride    . sodium chloride 1 mL/kg/hr (11/24/16 0901)  . heparin 1,100 Units/hr (11/23/16 2103)   PRN Meds: sodium chloride, nitroGLYCERIN, ondansetron (ZOFRAN) IV, sodium chloride flush   Vital Signs    Vitals:   11/23/16 0642 11/23/16 1328 11/23/16 2103 11/24/16 0435  BP: 123/74 107/62 125/67 126/70  Pulse: 73 72 73 68  Resp: 18 20 18 17   Temp: 98.1 F (36.7 C) 98.3 F (36.8 C) 98.5 F (36.9 C) 98.4 F (36.9 C)  TempSrc: Oral Oral Oral Oral  SpO2: 99% 97% 97% 99%  Weight: 132 lb (59.9 kg)     Height:        Intake/Output Summary (Last 24 hours) at 11/24/16 0907 Last data filed at 11/24/16 0851  Gross per 24 hour  Intake              242 ml  Output             2601 ml  Net            -2359 ml   Filed Weights   11/22/16 1528 11/22/16 2153 11/23/16 0642  Weight: 137 lb (62.1 kg) 133 lb 9.6 oz (60.6 kg) 132 lb (59.9 kg)    Telemetry    Sinus- Personally Reviewed  Physical Exam   GEN: WD BM No acute distress.   Neck: No JVD or carotid bruits.  Cardiac: RRR, 2/6 systolic murmur RUSB Respiratory: Clear to auscultation bilaterally; no wheeze GI: Soft, nontender, non-distended    MS: No edema, femoral, pedal, and radial pulses 2+. Neuro:  Nonfocal  Psych: Normal affect   Labs    Chemistry  Recent Labs Lab 11/22/16 1537 11/23/16 0638 11/24/16 0515  NA 137 138 140  K 3.5 3.4* 3.7  CL 102 100* 105  CO2 22 25 24   GLUCOSE 215* 121* 130*  BUN 37* 29* 29*  CREATININE 1.74* 1.42* 1.42*  CALCIUM 8.9 9.0 8.9  GFRNONAA 39* 49* 49*  GFRAA 45* 57* 57*  ANIONGAP 13 13 11      Hematology  Recent Labs Lab 11/22/16 1537 11/23/16 0638 11/24/16 0515  WBC 4.7 6.1 5.8  RBC 3.78* 4.21* 3.87*  HGB 12.0* 13.4 12.3*  HCT 35.3* 39.4 36.0*  MCV 93.4 93.6 93.0  MCH 31.7 31.8 31.8  MCHC 34.0 34.0 34.2  RDW 15.1 15.0 14.8  PLT 180 208 184    Cardiac Enzymes  Recent Labs Lab 11/22/16 1919 11/23/16 0052 11/23/16 0638  TROPONINI <0.03 <0.03 <0.03     Recent Labs Lab 11/22/16 1548  TROPIPOC 0.00     Echo 11/23/16: Study Conclusions  - Left ventricle: The cavity size was normal. There was mild   concentric hypertrophy. Systolic function was normal. The   estimated ejection fraction was in the range of 55% to 60%. There   is akinesis of the basalinferior and inferoseptal myocardium.   Features are consistent with a pseudonormal left ventricular   filling pattern, with concomitant abnormal relaxation and   increased filling pressure (grade 2 diastolic dysfunction). - Aortic valve: Trileaflet; severely calcified leaflets with   immobile right coronary cusp. - Mitral valve: Moderately calcified annulus. There was trivial   regurgitation.   Radiology    Dg Chest 2 View  Result Date: 11/22/2016 CLINICAL DATA:  Chest pain. EXAM: CHEST  2 VIEW COMPARISON:  05/06/2016 FINDINGS: Sequelae of prior CABG are again identified. The cardiac silhouette is upper limits of normal in size. Minimal streaky left basilar opacity likely represents atelectasis or scarring. The lungs are otherwise clear. No pleural effusion or pneumothorax is identified. No acute osseous  abnormality is seen. IMPRESSION: Minimal left basilar opacity, likely scarring or atelectasis. Electronically Signed   By: Logan Bores M.D.   On: 11/22/2016 16:09    Patient Profile     71 year old male with past medical history of coronary artery disease status post coronary artery bypass graft, hypertension, hyperlipidemia chronic stage III kidney disease, COPD, diabetes mellitus, HIV with unstable angina. Patient is status post coronary artery bypass and graft in 2003. Admitted in December 2016 with an acute infarct. Cardiac catheterization revealed severe three-vessel coronary disease. Saphenous vein graft to the diagonal patent but  had an 85% lesion distal to the SVG insertion. LIMA to the LAD patent. Sequential saphenous vein graft to the ramus and first appears marginal normal. Sequential saphenous vein graft to the PDA and posterior lateral had a 99% stenosis in the distal graft. Patient had PCI of the saphenous vein graft to the PDA and posterior lateral. Echocardiogram December 2016 showed normal LV function, grade 2 diastolic dysfunction, mild aortic stenosis, mild mitral regurgitation and mild left atrial enlargement.   Assessment & Plan    1. Unstable angina-patient presents with recurrent symptoms that are identical to his previous infarct pain. He remains pain-free. Enzymes negative. Continue aspirin, heparin and metoprolol 12.5 mg twice a day. Continue statin.  Plan for cath today. The risks and benefits including contrast nephropathy, myocardial infarction, CVA and death discussed and he agrees to proceed. He does have baseline renal insufficiency. Continue to hold diuretics. Hold ACE inhibitor. Proceed with gentle hydration this evening prior to catheterization. No ventriculogram.  Hold Glucophage for 48 hours following procedure. EF 50-55% with inferior wall motion abnormality by Echo.   2. Chronic stage III kidney disease-patient will be at risk for contrast nephropathy. Plan as  outlined above. Follow renal function closely after procedure.  3. Hyperlipidemia-continue statin.  4. Hypertension-blood pressure is controlled at present. Continue preadmission medications. Hold ACE inhibitor and diuretics prior to catheterization. Resume after  5. History of mild aortic stenosis- sounds mild on exam. Echocardiogram shows immobile right coronary cusp but no significant AS.   6 HIV-continue preadmission medications.    Signed, Jeter Tomey [place], MD  11/24/2016, 9:07 AM

## 2016-11-24 NOTE — Care Management Note (Signed)
Case Management Note Marvetta Gibbons RN, BSN Unit 2W-Case Manager [phone]  Patient Details  Name: [name] MRN: [id number] Date of Birth: 1945/09/15  Subjective/Objective:  Pt admitted with [place], plan for heart cath                  Action/Plan: PTA pt lived at home, independent- anticipate return home, CM to follow.   Expected Discharge Date:                  Expected Discharge Plan:  Home/Self Care  In-House Referral:     Discharge planning Services  CM Consult  Post Acute Care Choice:  NA Choice offered to:  NA  DME Arranged:    DME Agency:     HH Arranged:    HH Agency:     Status of Service:  In process, will continue to follow  If discussed at Long Length of Stay Meetings, dates discussed:    Additional Comments:  Dawayne Patricia, RN 11/24/2016, 11:13 AM

## 2016-11-24 NOTE — Progress Notes (Signed)
ANTICOAGULATION CONSULT NOTE - Follow Up Consult  Pharmacy Consult for Heparin Indication: chest pain/ACS  No Known Allergies  Patient Measurements: Height: 5\' 3"  (160 cm) Weight: 132 lb (59.9 kg) IBW/kg (Calculated) : 56.9 Heparin Dosing Weight: 60 kg  Vital Signs: Temp: 98.1 F (36.7 C) (04/23 1417) Temp Source: Oral (04/23 1417) BP: 143/62 (04/23 1417) Pulse Rate: 64 (04/23 1417)  Labs:  Recent Labs  11/22/16 1537 11/22/16 1919 11/23/16 0052  11/23/16 9892 11/23/16 1545 11/24/16 0008 11/24/16 0515  HGB 12.0*  --   --   --  13.4  --   --  12.3*  HCT 35.3*  --   --   --  39.4  --   --  36.0*  PLT 180  --   --   --  208  --   --  184  LABPROT  --   --   --   --   --   --   --  13.6  INR  --   --   --   --   --   --   --  1.04  HEPARINUNFRC  --   --   --   < > 0.18* 0.22* 0.39 0.35  CREATININE 1.74*  --   --   --  1.42*  --   --  1.42*  TROPONINI  --  <0.03 <0.03  --  <0.03  --   --   --   < > = values in this interval not displayed.  Estimated Creatinine Clearance: 40.1 mL/min (A) (by C-G formula based on SCr of 1.42 mg/dL (H)).  Assessment:  Heparin level remains low therapeutic (0.35) on 1100 units/hr.  For cath later today.  Goal of Therapy:  Heparin level 0.3-0.7 units/ml Monitor platelets by anticoagulation protocol: Yes   Plan:   Continue heparin drip at 1100 units/hr.  Daily heparin level and CBC while on heparin.  Follow up after cath.  Arty Baumgartner, [place] Pager: [phone] 11/24/2016,2:51 PM

## 2016-11-24 NOTE — Interval H&P Note (Signed)
History and Physical Interval Note:  11/24/2016 4:11 PM  [name]  has presented today for cardiac catheterization, with the diagnosis of unstable angina  The various methods of treatment have been discussed with the patient and family. After consideration of risks, benefits and other options for treatment, the patient has consented to  Procedure(s): Left Heart Cath and Coronary Angiography (N/A) as a surgical intervention .  The patient's history has been reviewed, patient examined, no change in status, stable for surgery.  I have reviewed the patient's chart and labs.  Questions were answered to the patient's satisfaction.    Cath Lab Visit (complete for each Cath Lab visit)  Clinical Evaluation Leading to the Procedure:   ACS: Yes.    Non-ACS:  N/A  Linnie Delgrande

## 2016-11-24 NOTE — Brief Op Note (Signed)
Brief Cardiac Catheterization Note (Full Report to Follow)  Date: 11/24/2016 Time: 5:18 PM  PATIENT:  [name]  71 y.o. male  PRE-OPERATIVE DIAGNOSIS:  unstable angina  POST-OPERATIVE DIAGNOSIS:  same  PROCEDURE:  Procedure(s): Left Heart Cath and Coronary Angiography (N/A)  SURGEON:  Surgeon(s) and Role:    * Nelva Bush, MD - Primary  FINDINGS: 1. Severe native CAD, including 80% ostial LAD, CTO proximal LCx, and CTO proximal RCA. 2. Widely patient LIMA-LAD. 3. Patent SVG->diagonal with stable 80-90% stenosis in diagonal just distal to SVG anastomosis. 4. Patent SVG->OM1 and OM2 with 80-90% stenosis in jump graft immediately distal to OM1 anastomosis. 5. Ostially occluded SVG->PDA with stial vessel filling via left-to-left collaterals. 6. Normal left ventricular filling pressure.  RECOMMENDATIONS: 1. Aggressive medical therapy; will add isosorbide mononitrate, to be titrated up as tolerated. 2. If chest pain persists, could consider PCI to SVG-OM1->OM2, though location of disease would put OM1 branch in jeopardy.  Nelva Bush, MD [facility] Pager: [phone]

## 2016-11-24 NOTE — Progress Notes (Signed)
ANTICOAGULATION CONSULT NOTE - Follow Up Consult  Pharmacy Consult for heparin Indication: USAP  Labs:  Recent Labs  11/22/16 1537 11/22/16 1919 11/23/16 0052 11/23/16 0638 11/23/16 1545 11/24/16 0008  HGB 12.0*  --   --  13.4  --   --   HCT 35.3*  --   --  39.4  --   --   PLT 180  --   --  208  --   --   HEPARINUNFRC  --   --   --  0.18* 0.22* 0.39  CREATININE 1.74*  --   --  1.42*  --   --   TROPONINI  --  <0.03 <0.03 <0.03  --   --     Assessment/Plan:  71yo male therapeutic on heparin after rate change. Will continue gtt at current rate and confirm stable with am labs.   Wynona Neat, PharmD, BCPS  11/24/2016,12:47 AM

## 2016-11-24 NOTE — Progress Notes (Signed)
45fr sheath aspirated and removed from rfa, manual pressure applied for 20 minutes. Groin level 0. Tegaderm dressing applied. Bedrest instructions given.  Bilateral dp pulses weak but palpable. Left pt weakly palpable. Right pt intermittently palpable.  Bedrest begins at 18:00:00

## 2016-11-25 ENCOUNTER — Encounter (HOSPITAL_COMMUNITY): Payer: Self-pay | Admitting: Internal Medicine

## 2016-11-25 ENCOUNTER — Encounter: Payer: Self-pay | Admitting: Adult Health

## 2016-11-25 LAB — BASIC METABOLIC PANEL
Anion gap: 9 (ref 5–15)
BUN: 25 mg/dL — ABNORMAL HIGH (ref 6–20)
CHLORIDE: 104 mmol/L (ref 101–111)
CO2: 24 mmol/L (ref 22–32)
CREATININE: 1.34 mg/dL — AB (ref 0.61–1.24)
Calcium: 9.1 mg/dL (ref 8.9–10.3)
GFR calc non Af Amer: 53 mL/min — ABNORMAL LOW (ref >60)
Glucose, Bld: 221 mg/dL — ABNORMAL HIGH (ref 65–99)
POTASSIUM: 3.5 mmol/L (ref 3.5–5.1)
Sodium: 137 mmol/L (ref 135–145)

## 2016-11-25 LAB — GLUCOSE, CAPILLARY
Glucose-Capillary: 135 mg/dL — ABNORMAL HIGH (ref 65–99)
Glucose-Capillary: 193 mg/dL — ABNORMAL HIGH (ref 65–99)

## 2016-11-25 LAB — CBC
HCT: 34.3 % — ABNORMAL LOW (ref 39.0–52.0)
HEMOGLOBIN: 11.5 g/dL — AB (ref 13.0–17.0)
MCH: 31.2 pg (ref 26.0–34.0)
MCHC: 33.5 g/dL (ref 30.0–36.0)
MCV: 93 fL (ref 78.0–100.0)
Platelets: 189 10*3/uL (ref 150–400)
RBC: 3.69 MIL/uL — ABNORMAL LOW (ref 4.22–5.81)
RDW: 14.7 % (ref 11.5–15.5)
WBC: 5.3 10*3/uL (ref 4.0–10.5)

## 2016-11-25 MED ORDER — ISOSORBIDE MONONITRATE ER 30 MG PO TB24: 30.0000 mg | ORAL_TABLET | Freq: Every day | ORAL | 5 refills | Status: DC

## 2016-11-25 MED ORDER — METOPROLOL TARTRATE 25 MG PO TABS: 12.5000 mg | ORAL_TABLET | Freq: Two times a day (BID) | ORAL | 5 refills | Status: DC

## 2016-11-25 MED ORDER — AMLODIPINE BESYLATE 10 MG PO TABS: 5.0000 mg | ORAL_TABLET | Freq: Every day | ORAL | 5 refills | Status: DC

## 2016-11-25 NOTE — Progress Notes (Signed)
Pt given discharge instructions, medication lists, follow up appointments, and when to call the doctor.  Pt verbalizes understanding. Pt given signs and symptoms of infection. Kailon Treese McClintock, RN    

## 2016-11-25 NOTE — Discharge Summary (Signed)
Discharge Summary    Patient ID: [name],  MRN: [id number], DOB/AGE: 1945/12/18 71 y.o.  Admit date: 11/22/2016 Discharge date: 11/25/2016  Primary Care Provider: Dorothyann Peng Primary Cardiologist: Dr. Stanford Breed  Discharge Diagnoses    Active Problems:   Human immunodeficiency virus (HIV) disease ([place])   DM (diabetes mellitus), type 2 with ophthalmic complications ([place])   Essential hypertension   Coronary atherosclerosis   CORONARY ARTERY BYPASS GRAFT, HX OF   HLD (hyperlipidemia)   Unstable angina ([place])   History of Present Illness     [name] is a 71 y.o. male with past medical history of CAD (s/p CABG in 2003, PCI to SVG-PDA in 07/2015), HTN, HLD, Stage 3 CKD, Type 2 DM, mild AS and HIV who presented to [facility] ED on 11/22/2016 for evaluation of chest pain.   He reported carrying heavy objects earlier in the day when he developed substernal chest pressure which radiated to his jaw. Symptoms were similar to prior MI's.   His initial troponin was negative and EKG was without acute ischemic changes. With his presenting symptoms, he was started on Heparin and was admitted with anticipation of a cardiac catheterization the following Monday.   Hospital Course     Consultants:None   The following morning, he denied any recurrent chest pain or dyspnea on exertion. Cardiac enzymes remained negative. An echocardiogram was performed and showed a preserved EF of 55-60% with Grade 2 DD and akinesis of the basalinferior and inferoseptal myocardium. With his Stage 3 CKD, he was appropriately hydrated for his cath on 4/23.  Angiography on 4/23 showed severe native CAD with 80% ostial LAD stenosis, CTO of the proximal LCx, and CTO proximal RCA (non-dominant). LIMA-LAD was patent with patent SVG->diagonal with stable 80-90% stenosis in diagonal just distal to SVG anastomosis and patent SVG->OM1 and OM2 with 80-90% stenosis in jump graft immediately distal to OM1  anastomosis, new since 2016. The SVG->PDA was occluded with left-to-left collaterals. Continued medical therapy was recommended and if chest pain persists the possibility of PCI to SVG-OM1->OM2 could be considered. Imdur 30mg  daily was added to his medication regimen and he will continue on ASA, BB, and statin therapy.  The following morning, he denied any recurrent chest pain. Groin cath site was stable with no evidence of a hematoma. Creatinine remained stable at 1.34. He was last examined by Dr. [place]ique and deemed stable for discharge. Two week Cardiology follow-up has been arranged.    _____________  Discharge Physical Exam and Vitals Blood pressure 100/60, pulse 61, temperature 97.9 F (36.6 C), temperature source Oral, resp. rate 16, height 5\' 3"  (1.6 m), weight 132 lb (59.9 kg), SpO2 100 %.  Filed Weights   11/22/16 1528 11/22/16 2153 11/23/16 0642  Weight: 137 lb (62.1 kg) 133 lb 9.6 oz (60.6 kg) 132 lb (59.9 kg)    General: Pleasant, NAD Psych: Normal affect. Neuro: Alert and oriented X 3. Moves all extremities spontaneously. HEENT: Normal  Neck: Supple without bruits or JVD. Lungs:  Resp regular and unlabored, CTA without wheezing or rales. Heart: RRR no s3, s4, or murmurs. Abdomen: Soft, non-tender, non-distended, BS + x 4.  Extremities: No clubbing, cyanosis or lower extremity edema. DP/PT/Radials 2+ and equal bilaterally.  Labs & Radiologic Studies     CBC  Recent Labs  11/24/16 0515 11/25/16 0204  WBC 5.8 5.3  HGB 12.3* 11.5*  HCT 36.0* 34.3*  MCV 93.0 93.0  PLT 184 189   Basic  Metabolic Panel  Recent Labs  11/24/16 0515 11/25/16 1338  NA 140 137  K 3.7 3.5  CL 105 104  CO2 24 24  GLUCOSE 130* 221*  BUN 29* 25*  CREATININE 1.42* 1.34*  CALCIUM 8.9 9.1   Liver Function Tests No results for input(s): AST, ALT, ALKPHOS, BILITOT, PROT, ALBUMIN in the last 72 hours. No results for input(s): LIPASE, AMYLASE in the last 72 hours. Cardiac  Enzymes  Recent Labs  11/22/16 1919 11/23/16 0052 11/23/16 0638  TROPONINI <0.03 <0.03 <0.03   BNP Invalid input(s): POCBNP D-Dimer No results for input(s): DDIMER in the last 72 hours. Hemoglobin A1C  Recent Labs  11/23/16 0052  HGBA1C 6.3*   Fasting Lipid Panel  Recent Labs  11/23/16 0052  CHOL 149  HDL 28*  LDLCALC 73  TRIG 240*  CHOLHDL 5.3   Thyroid Function Tests No results for input(s): TSH, T4TOTAL, T3FREE, THYROIDAB in the last 72 hours.  Invalid input(s): FREET3  Dg Chest 2 View  Result Date: 11/22/2016 CLINICAL DATA:  Chest pain. EXAM: CHEST  2 VIEW COMPARISON:  05/06/2016 FINDINGS: Sequelae of prior CABG are again identified. The cardiac silhouette is upper limits of normal in size. Minimal streaky left basilar opacity likely represents atelectasis or scarring. The lungs are otherwise clear. No pleural effusion or pneumothorax is identified. No acute osseous abnormality is seen. IMPRESSION: Minimal left basilar opacity, likely scarring or atelectasis. Electronically Signed   By: Logan Bores M.D.   On: 11/22/2016 16:09     Diagnostic Studies/Procedures    Cardiac Catheterization: 11/24/2016 Findings: 1. Severe native CAD, including 80% ostial LAD, CTO proximal LCx, and CTO proximal RCA (non-dominant). 2. Widely patient LIMA-LAD. 3. Patent SVG->diagonal with stable 80-90% stenosis in diagonal just distal to SVG anastomosis. 4. Patent SVG->OM1 and OM2 with 80-90% stenosis in jump graft immediately distal to OM1 anastomosis, new since 2016. 5. Ostially occluded SVG->PDA with distal vessel filling via left-to-left collaterals. 6. Normal left ventricular filling pressure.  Recommendations: 1. Aggressive medical therapy; will add isosorbide mononitrate, to be titrated up as tolerated. 2. If chest pain persists, could consider PCI to SVG-OM1->OM2, though location of disease would put ramus/high OM branch in jeopardy.  Echocardiogram: 11/23/2016 Study  Conclusions  - Left ventricle: The cavity size was normal. There was mild   concentric hypertrophy. Systolic function was normal. The   estimated ejection fraction was in the range of 55% to 60%. There   is akinesis of the basalinferior and inferoseptal myocardium.   Features are consistent with a pseudonormal left ventricular   filling pattern, with concomitant abnormal relaxation and   increased filling pressure (grade 2 diastolic dysfunction). - Aortic valve: Trileaflet; severely calcified leaflets with   immobile right coronary cusp. - Mitral valve: Moderately calcified annulus. There was trivial   regurgitation.  Disposition   Pt is being discharged home today in good condition.  Follow-up Plans & Appointments    Follow-up [place] [facility] [place], [place] Follow up on 12/08/2016.   Specialties:  Physician Assistant, Cardiology Why:  Cardiology Hospital Follow-up on 12/08/2016 at 2:00PM.  Contact information: [address] [place] [address] [phone]          Discharge Instructions    Diet - low sodium heart healthy    Complete by:  As directed    Discharge instructions    Complete by:  As directed    [place]  YOUR FOLLOW-UP OFFICE VISITS.  PLEASE ATTEND ALL SCHEDULED FOLLOW-UP APPOINTMENTS.   Activity: Increase activity slowly as tolerated. You may shower, but no soaking baths (or swimming) for 1 week. No driving for 24 hours. No lifting over 5 lbs for 1 week. No sexual activity for 1 week.   You May Return to Work: in 1 week (if applicable)  Wound Care: You may wash cath site gently with soap and water. Keep cath site clean and dry. If you notice pain, swelling, bleeding or pus at your cath site, please call [phone].   Increase activity slowly    Complete by:  As directed       Discharge Medications     Medication List    STOP taking these medications   pantoprazole 40 MG  tablet Commonly known as:  PROTONIX     TAKE these medications   amLODipine 10 MG tablet Commonly known as:  NORVASC Take 0.5 tablets (5 mg total) by mouth at bedtime.   aspirin 81 MG chewable tablet Chew 81 mg by mouth daily.   atorvastatin 20 MG tablet Commonly known as:  LIPITOR Take 1 tablet (20 mg total) by mouth daily.   b complex vitamins tablet Take 1 tablet by mouth daily.   Cholecalciferol 2000 units Caps Take 1 capsule (2,000 Units total) by mouth daily.   diclofenac 50 MG EC tablet Commonly known as:  VOLTAREN TAKE 1 TABLET(50 MG) BY MOUTH TWICE DAILY   freestyle lancets Use as directed three times a day to check blood sugar.  DX E11.9   furosemide 20 MG tablet Commonly known as:  LASIX TAKE 2 TABLETS BY MOUTH DAILY AS NEEDED FOR FLUID RETENTION OR SWELLING   glucose blood test strip Commonly known as:  FREESTYLE TEST STRIPS Use three times daily to check blood sugar.  DX E11.9   Insulin Glargine 300 UNIT/ML Sopn Commonly known as:  TOUJEO SOLOSTAR Inject 10 Units into the skin daily.   Insulin Pen Needle 31G X 5 MM Misc Commonly known as:  B-D UF III MINI PEN NEEDLES 1 each by Does not apply route 2 (two) times daily.   isosorbide mononitrate 30 MG 24 hr tablet Commonly known as:  IMDUR Take 1 tablet (30 mg total) by mouth daily. Start taking on:  11/26/2016   Krill Oil 300 MG Caps Take 1 capsule (300 mg total) by mouth daily.   lisinopril 5 MG tablet Commonly known as:  PRINIVIL,ZESTRIL TAKE 1/2 TABLET BY MOUTH DAILY What changed:  See the new instructions.   metFORMIN 500 MG 24 hr tablet Commonly known as:  GLUCOPHAGE-XR Take 2 tablets (1,000 mg total) by mouth daily with supper. Notes to patient:  RESUME ON 11/27/2016.   metolazone 2.5 MG tablet Commonly known as:  ZAROXOLYN TAKE 1 TABLET BY MOUTH ONCE DAILY 20 MINUTES PRIOR TO FUROSEMIDE   metoprolol tartrate 25 MG tablet Commonly known as:  LOPRESSOR Take 0.5 tablets (12.5 mg total)  by mouth 2 (two) times daily.   neomycin-polymyxin-hydrocortisone otic solution Commonly known as:  CORTISPORIN INSTILL 3 DROPS IN BOTH EARS THREE TIMES DAILY   nitroGLYCERIN 0.4 MG SL tablet Commonly known as:  NITROSTAT Place 1 tablet (0.4 mg total) under the tongue every 5 (five) minutes as needed for chest pain.   NORVIR 100 MG Tabs tablet Generic drug:  ritonavir TAKE 1 TABLET(100 MG) BY MOUTH DAILY   omega-3 acid ethyl esters 1 g capsule Commonly known as:  LOVAZA Take 1 capsule (1 g  total) by mouth 2 (two) times daily.   pioglitazone 15 MG tablet Commonly known as:  ACTOS TAKE 1 TABLET(15 MG) BY MOUTH DAILY   potassium chloride SA 20 MEQ tablet Commonly known as:  K-DUR,KLOR-CON TAKE 1 TABLET(20 MEQ) BY MOUTH THREE TIMES DAILY   PREZISTA 800 MG tablet Generic drug:  darunavir TAKE 1 TABLET(800 MG) BY MOUTH DAILY   PROBIOTIC DAILY Caps Take 1 by mouth daily   repaglinide 2 MG tablet Commonly known as:  PRANDIN TAKE 2 TABLETS BY MOUTH BEFORE BREAKFAST, 1 TABLET BY MOUTH BEFORE LUNCH AND 2 TABLETS BY MOUTH BEFORE SUPPER   rilpivirine 25 MG Tabs tablet Commonly known as:  EDURANT Take 1 tablet (25 mg total) by mouth daily with breakfast.   sodium bicarbonate 650 MG tablet Take 650 mg by mouth 3 (three) times daily.   TIVICAY 50 MG tablet Generic drug:  dolutegravir TAKE 1 TABLET BY MOUTH DAILY   triamcinolone cream 0.1 % Commonly known as:  KENALOG APP TO DRY ITCHY SKIN BID PRN   valACYclovir 500 MG tablet Commonly known as:  VALTREX TAKE 1 TABLET BY MOUTH DAILY       Aspirin prescribed at discharge?  Yes High Intensity Statin Prescribed? (Lipitor 40-80mg  or Crestor 20-40mg ): Atorvastatin 20mg  daily Beta Blocker Prescribed? Yes For EF 40% or less, Was ACEI/ARB Prescribed? No: EF > 40% ADP Receptor Inhibitor Prescribed? (i.e. Plavix etc.-Includes Medically Managed Patients): No: Not ACS For EF <40%, Aldosterone Inhibitor Prescribed? No: EF > 40% Was  EF assessed during THIS hospitalization? Yes Was Cardiac Rehab II ordered? (Included Medically managed Patients): No: Not ACS   Allergies No Known Allergies   Outstanding Labs/Studies   None  Duration of Discharge Encounter   Greater than 30 minutes including physician time.  Signed, Erma Heritage, PA-C 11/25/2016, 3:17 PM

## 2016-11-27 ENCOUNTER — Encounter: Payer: Self-pay | Admitting: Adult Health

## 2016-11-28 ENCOUNTER — Encounter: Payer: Self-pay | Admitting: Adult Health

## 2016-11-29 ENCOUNTER — Encounter (HOSPITAL_COMMUNITY): Payer: Self-pay | Admitting: Emergency Medicine

## 2016-11-29 ENCOUNTER — Emergency Department (HOSPITAL_COMMUNITY): Payer: Medicare Other

## 2016-11-29 ENCOUNTER — Inpatient Hospital Stay (HOSPITAL_COMMUNITY)
Admission: EM | Admit: 2016-11-29 | Discharge: 2016-12-02 | DRG: 246 | Disposition: A | Discharge status: Home / Self Care | Payer: Medicare Other | Attending: Cardiology | Admitting: Cardiology

## 2016-11-29 DIAGNOSIS — N179 Acute kidney failure, unspecified: Secondary | ICD-10-CM | POA: Diagnosis present

## 2016-11-29 DIAGNOSIS — Z79899 Other long term (current) drug therapy: Secondary | ICD-10-CM

## 2016-11-29 DIAGNOSIS — Z21 Asymptomatic human immunodeficiency virus [HIV] infection status: Secondary | ICD-10-CM | POA: Diagnosis not present

## 2016-11-29 DIAGNOSIS — Z794 Long term (current) use of insulin: Secondary | ICD-10-CM

## 2016-11-29 DIAGNOSIS — Z8249 Family history of ischemic heart disease and other diseases of the circulatory system: Secondary | ICD-10-CM

## 2016-11-29 DIAGNOSIS — I25810 Atherosclerosis of coronary artery bypass graft(s) without angina pectoris: Secondary | ICD-10-CM | POA: Diagnosis present

## 2016-11-29 DIAGNOSIS — N183 Chronic kidney disease, stage 3 (moderate): Secondary | ICD-10-CM | POA: Diagnosis not present

## 2016-11-29 DIAGNOSIS — Z803 Family history of malignant neoplasm of breast: Secondary | ICD-10-CM

## 2016-11-29 DIAGNOSIS — Z955 Presence of coronary angioplasty implant and graft: Secondary | ICD-10-CM

## 2016-11-29 DIAGNOSIS — Z7982 Long term (current) use of aspirin: Secondary | ICD-10-CM

## 2016-11-29 DIAGNOSIS — E1122 Type 2 diabetes mellitus with diabetic chronic kidney disease: Secondary | ICD-10-CM | POA: Diagnosis present

## 2016-11-29 DIAGNOSIS — N189 Chronic kidney disease, unspecified: Secondary | ICD-10-CM | POA: Diagnosis not present

## 2016-11-29 DIAGNOSIS — Z801 Family history of malignant neoplasm of trachea, bronchus and lung: Secondary | ICD-10-CM

## 2016-11-29 DIAGNOSIS — E782 Mixed hyperlipidemia: Secondary | ICD-10-CM | POA: Diagnosis present

## 2016-11-29 DIAGNOSIS — Z9861 Coronary angioplasty status: Secondary | ICD-10-CM

## 2016-11-29 DIAGNOSIS — I2510 Atherosclerotic heart disease of native coronary artery without angina pectoris: Secondary | ICD-10-CM

## 2016-11-29 DIAGNOSIS — I2583 Coronary atherosclerosis due to lipid rich plaque: Secondary | ICD-10-CM | POA: Diagnosis present

## 2016-11-29 DIAGNOSIS — E1139 Type 2 diabetes mellitus with other diabetic ophthalmic complication: Secondary | ICD-10-CM | POA: Diagnosis present

## 2016-11-29 DIAGNOSIS — N1832 Chronic kidney disease, stage 3b: Secondary | ICD-10-CM | POA: Diagnosis present

## 2016-11-29 DIAGNOSIS — E785 Hyperlipidemia, unspecified: Secondary | ICD-10-CM | POA: Diagnosis present

## 2016-11-29 DIAGNOSIS — B20 Human immunodeficiency virus [HIV] disease: Secondary | ICD-10-CM | POA: Diagnosis present

## 2016-11-29 DIAGNOSIS — J449 Chronic obstructive pulmonary disease, unspecified: Secondary | ICD-10-CM | POA: Diagnosis not present

## 2016-11-29 DIAGNOSIS — I214 Non-ST elevation (NSTEMI) myocardial infarction: Secondary | ICD-10-CM | POA: Diagnosis not present

## 2016-11-29 DIAGNOSIS — Z87442 Personal history of urinary calculi: Secondary | ICD-10-CM

## 2016-11-29 DIAGNOSIS — I129 Hypertensive chronic kidney disease with stage 1 through stage 4 chronic kidney disease, or unspecified chronic kidney disease: Secondary | ICD-10-CM | POA: Diagnosis not present

## 2016-11-29 DIAGNOSIS — M1990 Unspecified osteoarthritis, unspecified site: Secondary | ICD-10-CM | POA: Diagnosis present

## 2016-11-29 DIAGNOSIS — Z823 Family history of stroke: Secondary | ICD-10-CM

## 2016-11-29 DIAGNOSIS — I10 Essential (primary) hypertension: Secondary | ICD-10-CM | POA: Diagnosis present

## 2016-11-29 DIAGNOSIS — R079 Chest pain, unspecified: Secondary | ICD-10-CM | POA: Diagnosis not present

## 2016-11-29 DIAGNOSIS — Z8261 Family history of arthritis: Secondary | ICD-10-CM

## 2016-11-29 DIAGNOSIS — K219 Gastro-esophageal reflux disease without esophagitis: Secondary | ICD-10-CM | POA: Diagnosis present

## 2016-11-29 DIAGNOSIS — Z810 Family history of intellectual disabilities: Secondary | ICD-10-CM

## 2016-11-29 DIAGNOSIS — I252 Old myocardial infarction: Secondary | ICD-10-CM

## 2016-11-29 DIAGNOSIS — Z833 Family history of diabetes mellitus: Secondary | ICD-10-CM

## 2016-11-29 DIAGNOSIS — Z951 Presence of aortocoronary bypass graft: Secondary | ICD-10-CM

## 2016-11-29 HISTORY — DX: Non-ST elevation (NSTEMI) myocardial infarction: I21.4

## 2016-11-29 LAB — BASIC METABOLIC PANEL
ANION GAP: 15 (ref 5–15)
BUN: 34 mg/dL — AB (ref 6–20)
CALCIUM: 10 mg/dL (ref 8.9–10.3)
CO2: 23 mmol/L (ref 22–32)
Chloride: 102 mmol/L (ref 101–111)
Creatinine, Ser: 1.53 mg/dL — ABNORMAL HIGH (ref 0.61–1.24)
GFR calc Af Amer: 52 mL/min — ABNORMAL LOW (ref >60)
GFR, EST NON AFRICAN AMERICAN: 45 mL/min — AB (ref >60)
Glucose, Bld: 152 mg/dL — ABNORMAL HIGH (ref 65–99)
Potassium: 4.4 mmol/L (ref 3.5–5.1)
Sodium: 140 mmol/L (ref 135–145)

## 2016-11-29 LAB — CBC
HEMATOCRIT: 37.6 % — AB (ref 39.0–52.0)
HEMOGLOBIN: 12.7 g/dL — AB (ref 13.0–17.0)
MCH: 31.9 pg (ref 26.0–34.0)
MCHC: 33.8 g/dL (ref 30.0–36.0)
MCV: 94.5 fL (ref 78.0–100.0)
Platelets: 204 10*3/uL (ref 150–400)
RBC: 3.98 MIL/uL — ABNORMAL LOW (ref 4.22–5.81)
RDW: 14.9 % (ref 11.5–15.5)
WBC: 6.1 10*3/uL (ref 4.0–10.5)

## 2016-11-29 LAB — TROPONIN I
TROPONIN I: 1.88 ng/mL — AB (ref <0.03)
Troponin I: 2.3 ng/mL (ref <0.03)

## 2016-11-29 LAB — HEPARIN LEVEL (UNFRACTIONATED): Heparin Unfractionated: 0.2 IU/mL — ABNORMAL LOW (ref 0.30–0.70)

## 2016-11-29 LAB — GLUCOSE, CAPILLARY: Glucose-Capillary: 157 mg/dL — ABNORMAL HIGH (ref 65–99)

## 2016-11-29 LAB — I-STAT TROPONIN, ED: TROPONIN I, POC: 1.96 ng/mL — AB (ref 0.00–0.08)

## 2016-11-29 MED ORDER — NITROGLYCERIN 0.4 MG SL SUBL
0.4000 mg | SUBLINGUAL_TABLET | SUBLINGUAL | Status: DC | PRN
  Administered 2016-12-01 (×2): 0.4 mg via SUBLINGUAL
  Filled 2016-11-29: qty 1

## 2016-11-29 MED ORDER — ISOSORBIDE MONONITRATE ER 30 MG PO TB24
30.0000 mg | ORAL_TABLET | Freq: Every day | ORAL | Status: DC
  Administered 2016-11-29 – 2016-12-02 (×4): 30 mg via ORAL
  Filled 2016-11-29 (×4): qty 1

## 2016-11-29 MED ORDER — SODIUM BICARBONATE 650 MG PO TABS
650.0000 mg | ORAL_TABLET | Freq: Three times a day (TID) | ORAL | Status: DC
  Administered 2016-11-29 – 2016-12-02 (×9): 650 mg via ORAL
  Filled 2016-11-29 (×9): qty 1

## 2016-11-29 MED ORDER — METOPROLOL TARTRATE 12.5 MG HALF TABLET
12.5000 mg | ORAL_TABLET | Freq: Two times a day (BID) | ORAL | Status: DC
  Administered 2016-11-29 – 2016-12-02 (×6): 12.5 mg via ORAL
  Filled 2016-11-29 (×6): qty 1

## 2016-11-29 MED ORDER — AMLODIPINE BESYLATE 5 MG PO TABS
5.0000 mg | ORAL_TABLET | Freq: Every day | ORAL | Status: DC
  Administered 2016-11-30 – 2016-12-01 (×2): 5 mg via ORAL
  Filled 2016-11-29 (×2): qty 1

## 2016-11-29 MED ORDER — KRILL OIL 300 MG PO CAPS: 300.0000 mg | ORAL_CAPSULE | Freq: Every day | ORAL | Status: DC

## 2016-11-29 MED ORDER — RITONAVIR 100 MG PO TABS
100.0000 mg | ORAL_TABLET | Freq: Every day | ORAL | Status: DC
  Administered 2016-11-30 – 2016-12-02 (×2): 100 mg via ORAL
  Filled 2016-11-29 (×3): qty 1

## 2016-11-29 MED ORDER — ASPIRIN 81 MG PO CHEW
81.0000 mg | CHEWABLE_TABLET | Freq: Every day | ORAL | Status: DC
  Administered 2016-11-30: 81 mg via ORAL
  Filled 2016-11-29: qty 1

## 2016-11-29 MED ORDER — HEPARIN (PORCINE) IN NACL 100-0.45 UNIT/ML-% IJ SOLN
1100.0000 [IU]/h | INTRAMUSCULAR | Status: DC
  Administered 2016-11-29: 750 [IU]/h via INTRAVENOUS
  Filled 2016-11-29 (×2): qty 250

## 2016-11-29 MED ORDER — POTASSIUM CHLORIDE CRYS ER 20 MEQ PO TBCR
20.0000 meq | EXTENDED_RELEASE_TABLET | Freq: Three times a day (TID) | ORAL | Status: DC
  Administered 2016-11-29 – 2016-12-02 (×9): 20 meq via ORAL
  Filled 2016-11-29 (×9): qty 1

## 2016-11-29 MED ORDER — ASPIRIN 81 MG PO CHEW
324.0000 mg | CHEWABLE_TABLET | Freq: Once | ORAL | Status: AC
  Administered 2016-11-29: 324 mg via ORAL
  Filled 2016-11-29: qty 4

## 2016-11-29 MED ORDER — HEPARIN BOLUS VIA INFUSION
3500.0000 [IU] | Freq: Once | INTRAVENOUS | Status: AC
  Administered 2016-11-29: 3500 [IU] via INTRAVENOUS
  Filled 2016-11-29: qty 3500

## 2016-11-29 MED ORDER — VITAMIN D 1000 UNITS PO TABS
1000.0000 [IU] | ORAL_TABLET | Freq: Every day | ORAL | Status: DC
  Administered 2016-11-30 – 2016-12-02 (×3): 1000 [IU] via ORAL
  Filled 2016-11-29 (×3): qty 1

## 2016-11-29 MED ORDER — ONDANSETRON HCL 4 MG/2ML IJ SOLN
4.0000 mg | Freq: Four times a day (QID) | INTRAMUSCULAR | Status: DC | PRN
  Administered 2016-12-01: 4 mg via INTRAVENOUS
  Filled 2016-11-29: qty 2

## 2016-11-29 MED ORDER — DOLUTEGRAVIR SODIUM 50 MG PO TABS
50.0000 mg | ORAL_TABLET | Freq: Every day | ORAL | Status: DC
  Administered 2016-11-30 – 2016-12-02 (×2): 50 mg via ORAL
  Filled 2016-11-29 (×4): qty 1

## 2016-11-29 MED ORDER — LISINOPRIL 5 MG PO TABS
2.5000 mg | ORAL_TABLET | Freq: Every day | ORAL | Status: DC
  Administered 2016-11-30 – 2016-12-02 (×3): 2.5 mg via ORAL
  Filled 2016-11-29 (×3): qty 1

## 2016-11-29 MED ORDER — DARUNAVIR ETHANOLATE 800 MG PO TABS
800.0000 mg | ORAL_TABLET | Freq: Every day | ORAL | Status: DC
  Administered 2016-11-30 – 2016-12-02 (×2): 800 mg via ORAL
  Filled 2016-11-29 (×3): qty 1

## 2016-11-29 MED ORDER — VALACYCLOVIR HCL 500 MG PO TABS
500.0000 mg | ORAL_TABLET | Freq: Every day | ORAL | Status: DC
  Administered 2016-11-29 – 2016-12-02 (×4): 500 mg via ORAL
  Filled 2016-11-29 (×4): qty 1

## 2016-11-29 MED ORDER — B COMPLEX PO TABS: 1.0000 | ORAL_TABLET | Freq: Every day | ORAL | Status: DC

## 2016-11-29 MED ORDER — ATORVASTATIN CALCIUM 80 MG PO TABS
80.0000 mg | ORAL_TABLET | Freq: Every day | ORAL | Status: DC
  Administered 2016-11-29 – 2016-12-01 (×3): 80 mg via ORAL
  Filled 2016-11-29 (×3): qty 1

## 2016-11-29 MED ORDER — RILPIVIRINE HCL 25 MG PO TABS
25.0000 mg | ORAL_TABLET | Freq: Every day | ORAL | Status: DC
  Administered 2016-11-30 – 2016-12-02 (×2): 25 mg via ORAL
  Filled 2016-11-29 (×3): qty 1

## 2016-11-29 MED ORDER — OMEGA-3-ACID ETHYL ESTERS 1 G PO CAPS
1.0000 g | ORAL_CAPSULE | Freq: Two times a day (BID) | ORAL | Status: DC
  Administered 2016-11-29 – 2016-12-02 (×6): 1 g via ORAL
  Filled 2016-11-29 (×6): qty 1

## 2016-11-29 MED ORDER — NEOMYCIN-POLYMYXIN-HC 3.5-10000-1 OT SOLN
3.0000 [drp] | Freq: Three times a day (TID) | OTIC | Status: DC
  Administered 2016-11-29 – 2016-12-02 (×6): 3 [drp] via OTIC
  Filled 2016-11-29 (×2): qty 10

## 2016-11-29 MED ORDER — ACETAMINOPHEN 325 MG PO TABS: 650.0000 mg | ORAL_TABLET | ORAL | Status: DC | PRN

## 2016-11-29 MED ORDER — DICLOFENAC SODIUM 50 MG PO TBEC
50.0000 mg | DELAYED_RELEASE_TABLET | Freq: Two times a day (BID) | ORAL | Status: DC
  Administered 2016-11-29 – 2016-12-02 (×5): 50 mg via ORAL
  Filled 2016-11-29 (×6): qty 1

## 2016-11-29 NOTE — Progress Notes (Signed)
ANTICOAGULATION CONSULT NOTE - Initial Consult  Pharmacy Consult for heparin Indication: chest pain/ACS  No Known Allergies  Patient Measurements: Height: 5\' 3"  (160 cm) Weight: 135 lb 11.2 oz (61.6 kg) IBW/kg (Calculated) : 56.9 Heparin Dosing Weight: 60 kg  Vital Signs: Temp: 98.9 F (37.2 C) (04/28 1637) Temp Source: Oral (04/28 1637) BP: 139/70 (04/28 1637) Pulse Rate: 71 (04/28 1637)  Assessment: 71 yo M presents on 4/28 with chest pain. Hospital recently took him off of reflux medicine. Troponin is elevated in the ED. Pharmacy consulted to start heparin. No anticoag PTA. CBC a week ago showed Hgb of 11.5, plts 189  Initial heparin level low at 0.20  Goal of Therapy:  Heparin level 0.3-0.7 units/ml Monitor platelets by anticoagulation protocol: Yes   Plan:  Increase heparin to 900 units / hr 6 hour heparin level  Thank you Anette Guarneri, PharmD [phone] 11/29/2016 5:42 PM

## 2016-11-29 NOTE — Progress Notes (Signed)
ANTICOAGULATION CONSULT NOTE - Initial Consult  Pharmacy Consult for heparin Indication: chest pain/ACS  No Known Allergies  Patient Measurements: Height: 5\' 3"  (160 cm) Weight: 132 lb (59.9 kg) IBW/kg (Calculated) : 56.9 Heparin Dosing Weight: 60 kg  Vital Signs: Temp: 98.7 F (37.1 C) (04/28 0843) Temp Source: Oral (04/28 0843) BP: 130/73 (04/28 0843) Pulse Rate: 66 (04/28 0843)  Assessment: 71 yo M presents on 4/28 with chest pain. Hospital recently took him off of reflux medicine. Troponin is elevated in the ED. Pharmacy consulted to start heparin. No anticoag PTA. CBC a week ago showed Hgb of 11.5, plts 189. New CBC in process  Goal of Therapy:  Heparin level 0.3-0.7 units/ml Monitor platelets by anticoagulation protocol: Yes   Plan:  Give heparin 4,000 unit bolus Start heparin gtt at 750 units/hr Check 6 hr heparin level Monitor daily heparin level, CBC, s/s of bleed   Elenor Quinones, PharmD, [facility] Clinical Pharmacist Pager [phone] 11/29/2016 9:33 AM

## 2016-11-29 NOTE — ED Triage Notes (Signed)
Pt. Stated, I was released from hospital on Tuesday and they took me off my reflux medicine. I started having chest pain this morning, but I've had some burning coming back up in my mouth.

## 2016-11-29 NOTE — H&P (Signed)
Patient ID: [name] MRN: [id number], DOB/AGE: 69-13-1947   Admit date: 11/29/2016   Primary Physician: Dorothyann Peng, NP Primary Cardiologist: Dr. Stanford Breed  Requested by ED physician  Pt. Profile:  [name] Is a pleasant 71 year old male with past medical history of hypertension, hyperlipidemia, CKD stage III, COPD, DM 2, HIV and CAD s/p CABG in 2003 presented back with NSTEMI after recent discharge. Has known 80-90% SVG to OM1-OM2 lesion was medically treated.   Problem List  Past Medical History:  Diagnosis Date  . Arthritis   . CAD (coronary artery disease)    2003- cabg  . Chronic kidney disease   . Constipation 05/28/2014  . COPD (chronic obstructive pulmonary disease) ([place])   . Dermatitis 11/27/2012  . Diabetes mellitus   . Epicondylitis 06/05/2013   right  . GERD (gastroesophageal reflux disease)   . History of depression   . History of kidney stones   . HIV (human immunodeficiency virus infection) ([place]) 1991   on meds since initial dx.   . Hyperlipidemia   . Hypertension   . Nasal abscess 12/04/2015  . Pancreatitis 11/2013   attributed to HIV meds.     Past Surgical History:  Procedure Laterality Date  . CARDIAC CATHETERIZATION N/A 07/29/2015   Procedure: Left Heart Cath and Coronary Angiography;  Surgeon: [name], MD;  Location: [facility];  Service: Cardiovascular;  Laterality: N/A;  . CARDIAC CATHETERIZATION N/A 07/29/2015   Procedure: Coronary Stent Intervention;  Surgeon: [name], MD;  Location: [facility];  Service: Cardiovascular;  Laterality: N/A;  . CORONARY ARTERY BYPASS GRAFT  05/2002  . LEFT HEART CATH AND CORS/GRAFTS ANGIOGRAPHY N/A 11/24/2016   Procedure: Left Heart Cath and Cors/Grafts Angiography;  Surgeon: Nelva Bush, MD;  Location: [facility];  Service: Cardiovascular;  Laterality: N/A;  . VASECTOMY       Allergies  No Known Allergies  HPI  [name] Is a pleasant 71 year old male with  past medical history of hypertension, hyperlipidemia, CKD stage III, COPD, DM 2, HIV and CAD s/p CABG in 2003. He was admitted in December 2016 with acute infarct. Cardiac catheterization showed severe three-vessel disease, he ultimately underwent PCI to SVG to PDA and posterolateral. Most recently, he returned to the hospital on 11/22/2016 with unstable angina. Echocardiogram obtained on 11/23/2016 showed EF 73-71%, grade 2 diastolic dysfunction, severely calcified leaflet in the aortic valve with immobile right coronary cusp. He underwent heart catheterization on 11/24/2016 which showed severe native CAD with 80% ostial LAD, CT of proximal left circumflex and proximal RCA. Widely patent LIMA to LAD. Patent SVG to diagonal was stable 80-90% stenosis in the diagonal just distal to the SVG anastomosis, patent SVG to OM1 and OM 2 with 80-90% stenosis in the jump graft immediately distal to the OM1 and the stenosis this is new since 2016. Ostially occluded SVG to PDA with left to left collaterals. Aggressive medical therapy was recommended, however if patient continued to have chest pain, may consider PCI to SVG to OM1 and OM 2, although location of the disease places high OM branch in jeopardy. Serial troponin was normal during the last admission. He was discharged on 11/25/2016, he continued to have intermittent chest discomfort in the last 2 days however not associated with much exertion.  This morning, he was sitting in the car ready to go to go eat at his local church around 8 AM when he had burning sensation radiating up.  It lasted a total of 10 minutes before resolving and has not recurred again. He says this is similar to his as reflux but however much stronger. Initial point-of-care troponin was 1.96, however troponin I came back 1.88 as well. Note, his troponin was normal during the recent hospital visit when he had cardiac catheterization. Cardiology was consulted for NSTEMI. Other laboratory finding include  creatinine of 1.54.   Home Medications  Prior to Admission medications   Medication Sig Start Date End Date Taking? Authorizing Provider  amLODipine (NORVASC) 10 MG tablet Take 0.5 tablets (5 mg total) by mouth at bedtime. 11/25/16  Yes Erma Heritage, PA-C  aspirin 81 MG chewable tablet Chew 81 mg by mouth daily.   Yes Historical Provider, MD  atorvastatin (LIPITOR) 20 MG tablet Take 1 tablet (20 mg total) by mouth daily. 09/15/16  Yes Lelon Perla, MD  b complex vitamins tablet Take 1 tablet by mouth daily. 09/25/16  Yes Mosie Lukes, MD  Cholecalciferol 2000 units CAPS Take 1 capsule (2,000 Units total) by mouth daily. 09/25/16  Yes Mosie Lukes, MD  diclofenac (VOLTAREN) 50 MG EC tablet TAKE 1 TABLET(50 MG) BY MOUTH TWICE DAILY 06/25/16  Yes Mosie Lukes, MD  furosemide (LASIX) 20 MG tablet TAKE 2 TABLETS BY MOUTH DAILY AS NEEDED FOR FLUID RETENTION OR SWELLING 03/17/16  Yes Mosie Lukes, MD  glucose blood (FREESTYLE TEST STRIPS) test strip Use three times daily to check blood sugar.  DX E11.9 03/17/16  Yes Mosie Lukes, MD  Insulin Pen Needle (B-D UF III MINI PEN NEEDLES) 31G X 5 MM MISC 1 each by Does not apply route 2 (two) times daily. 06/11/16  Yes Elayne Snare, MD  isosorbide mononitrate (IMDUR) 30 MG 24 hr tablet Take 1 tablet (30 mg total) by mouth daily. 11/26/16  Yes Erma Heritage, PA-C  Krill Oil 300 MG CAPS Take 1 capsule (300 mg total) by mouth daily. 09/25/16  Yes Mosie Lukes, MD  Lancets (FREESTYLE) lancets Use as directed three times a day to check blood sugar.  DX E11.9 02/25/16  Yes Mosie Lukes, MD  lisinopril (PRINIVIL,ZESTRIL) 5 MG tablet TAKE 1/2 TABLET BY MOUTH DAILY Patient taking differently: 2.5mg  by mount every other day 11/03/16  Yes Mosie Lukes, MD  metFORMIN (GLUCOPHAGE-XR) 500 MG 24 hr tablet Take 2 tablets (1,000 mg total) by mouth daily with supper. 09/26/16  Yes Elayne Snare, MD  metolazone (ZAROXOLYN) 2.5 MG tablet TAKE 1 TABLET BY MOUTH ONCE  DAILY 20 MINUTES PRIOR TO FUROSEMIDE 11/07/16  Yes Mosie Lukes, MD  metoprolol tartrate (LOPRESSOR) 25 MG tablet Take 0.5 tablets (12.5 mg total) by mouth 2 (two) times daily. 11/25/16  Yes [place], PA-C  neomycin-polymyxin-hydrocortisone (CORTISPORIN) otic solution INSTILL 3 DROPS IN BOTH EARS THREE TIMES DAILY 07/07/16  Yes Mosie Lukes, MD  nitroGLYCERIN (NITROSTAT) 0.4 MG SL tablet Place 1 tablet (0.4 mg total) under the tongue every 5 (five) minutes as needed for chest pain. 09/29/16  Yes Lelon Perla, MD  NORVIR 100 MG TABS tablet TAKE 1 TABLET(100 MG) BY MOUTH DAILY 05/14/16  Yes Michel Bickers, MD  omega-3 acid ethyl esters (LOVAZA) 1 g capsule Take 1 capsule (1 g total) by mouth 2 (two) times daily. 11/07/16  Yes Elayne Snare, MD  pioglitazone (ACTOS) 15 MG tablet TAKE 1 TABLET(15 MG) BY MOUTH DAILY 11/05/16  Yes Elayne Snare, MD  potassium chloride SA (K-DUR,KLOR-CON) 20 MEQ tablet TAKE  1 TABLET(20 MEQ) BY MOUTH THREE TIMES DAILY 11/20/16  Yes Dorothyann Peng, NP  PREZISTA 800 MG tablet TAKE 1 TABLET(800 MG) BY MOUTH DAILY 06/12/16  Yes Michel Bickers, MD  Probiotic Product (PROBIOTIC DAILY) CAPS Take 1 by mouth daily 09/25/16  Yes Mosie Lukes, MD  repaglinide (PRANDIN) 2 MG tablet TAKE 2 TABLETS BY MOUTH BEFORE BREAKFAST, 1 TABLET BY MOUTH BEFORE LUNCH AND 2 TABLETS BY MOUTH BEFORE SUPPER 09/15/16  Yes Elayne Snare, MD  rilpivirine (EDURANT) 25 MG TABS tablet Take 1 tablet (25 mg total) by mouth daily with breakfast. 10/07/16  Yes Michel Bickers, MD  sodium bicarbonate 650 MG tablet Take 650 mg by mouth 3 (three) times daily.   Yes Historical Provider, MD  TIVICAY 50 MG tablet TAKE 1 TABLET BY MOUTH DAILY 10/07/16  Yes Michel Bickers, MD  triamcinolone cream (KENALOG) 0.1 % APP TO DRY ITCHY SKIN BID PRN 10/15/15  Yes Historical Provider, MD  valACYclovir (VALTREX) 500 MG tablet TAKE 1 TABLET BY MOUTH DAILY 11/13/16  Yes Michel Bickers, MD  Insulin Glargine (TOUJEO SOLOSTAR) 300 UNIT/ML SOPN Inject  10 Units into the skin daily. Patient not taking: Reported on 11/22/2016 06/11/16   Elayne Snare, MD    Family History  Family History  Problem Relation Age of Onset  . Hyperlipidemia Mother   . Diabetes Mother     paternal grandparents/1 brother  . Hyperlipidemia Father   . Hypertension Father     paternal grandmother/3 brothers/1 sister  . Arthritis      mother/father/paternal grandparents  . Breast cancer Maternal Aunt     paternal aunt  . Lung cancer Maternal Aunt   . Heart disease      parents/maternal grandparents/ 2 brothers  . Stroke Paternal Grandmother   . Mental retardation Sister     Social History  Social History   Social History  . Marital status: Divorced    Spouse name: N/A  . Number of children: 4  . Years of education: N/A   Occupational History  . 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    Social History Main Topics  . Smoking status: Never Smoker  . Smokeless tobacco: Never Used  . Alcohol use 0.0 oz/week     Comment: rare  . Drug use: No  . Sexual activity: Not Currently     Comment: declined condoms   Other Topics Concern  . Not on file   Social History Narrative   Lives alone.  Supportive friends and family.  His HIV Dx is not a secret.      Review of Systems General:  No chills, fever, night sweats or weight changes.  Cardiovascular:  No dyspnea on exertion, edema, orthopnea, palpitations, paroxysmal nocturnal dyspnea. +chest pain Dermatological: No rash, lesions/masses Respiratory: No cough, dyspnea Urologic: No hematuria, dysuria Abdominal:   No nausea, vomiting, diarrhea, bright red blood per rectum, melena, or hematemesis Neurologic:  No visual changes, wkns, changes in mental status. All other systems reviewed and are otherwise negative except as noted above.  Physical Exam  Blood pressure 132/81, pulse 73, temperature 98.7 F (37.1 C), temperature source Oral, resp. rate 15, height 5\' 3"   (1.6 m), weight 132 lb (59.9 kg), SpO2 97 %.  General: Pleasant, NAD Psych: Normal affect. Neuro: Alert and oriented X 3. Moves all extremities spontaneously. HEENT: Normal  Neck: Supple without bruits or JVD. Lungs:  Resp regular and unlabored, CTA. Heart: RRR no s3,  s4, or murmurs. Abdomen: Soft, non-tender, non-distended, BS + x 4.  Extremities: No clubbing, cyanosis or edema. DP/PT/Radials 2+ and equal bilaterally.  Labs  Troponin [facility] of Care Test)  Recent Labs  11/29/16 0919  TROPIPOC 1.96*   No results for input(s): CKTOTAL, CKMB, TROPONINI in the last 72 hours. Lab Results  Component Value Date   WBC 6.1 11/29/2016   HGB 12.7 (L) 11/29/2016   HCT 37.6 (L) 11/29/2016   MCV 94.5 11/29/2016   PLT 204 11/29/2016    Recent Labs Lab 11/29/16 0909  NA 140  K 4.4  CL 102  CO2 23  BUN 34*  CREATININE 1.53*  CALCIUM 10.0  GLUCOSE 152*   Lab Results  Component Value Date   CHOL 149 11/23/2016   HDL 28 (L) 11/23/2016   LDLCALC 73 11/23/2016   TRIG 240 (H) 11/23/2016   No results found for: DDIMER   Radiology/Studies  Dg Chest 2 View  Result Date: 11/29/2016 CLINICAL DATA:  Chest pain and hypertension EXAM: CHEST  2 VIEW COMPARISON:  November 22, 2016 FINDINGS: There is slight scarring in the left base. Lungs elsewhere are clear. Heart size and pulmonary vascularity are normal. No adenopathy. Patient is status post coronary artery bypass grafting. No adenopathy. No bone lesions. IMPRESSION: Slight scarring left base.  No edema or consolidation. Electronically Signed   By: Lowella Grip III M.D.   On: 11/29/2016 10:08   Dg Chest 2 View  Result Date: 11/22/2016 CLINICAL DATA:  Chest pain. EXAM: CHEST  2 VIEW COMPARISON:  05/06/2016 FINDINGS: Sequelae of prior CABG are again identified. The cardiac silhouette is upper limits of normal in size. Minimal streaky left basilar opacity likely represents atelectasis or scarring. The lungs are otherwise clear. No pleural  effusion or pneumothorax is identified. No acute osseous abnormality is seen. IMPRESSION: Minimal left basilar opacity, likely scarring or atelectasis. Electronically Signed   By: Logan Bores M.D.   On: 11/22/2016 16:09    ECG  Normal sinus rhythm, nonspecific T-wave changes, more noticeable T wave inversion in lead V6, however not in V5.  Echocardiogram  LV EF: 55% -   60%  ------------------------------------------------------------------- Indications:      Chest pain 786.51.  ------------------------------------------------------------------- History:   PMH:  Genital herpes. HIV.  Angina pectoris.  Chronic obstructive pulmonary disease.  PMH:   Myocardial infarction.  Risk factors:  Hypertension. Diabetes mellitus. Dyslipidemia.  ------------------------------------------------------------------- Study Conclusions  - Left ventricle: The cavity size was normal. There was mild   concentric hypertrophy. Systolic function was normal. The   estimated ejection fraction was in the range of 55% to 60%. There   is akinesis of the basalinferior and inferoseptal myocardium.   Features are consistent with a pseudonormal left ventricular   filling pattern, with concomitant abnormal relaxation and   increased filling pressure (grade 2 diastolic dysfunction). - Aortic valve: Trileaflet; severely calcified leaflets with   immobile right coronary cusp. - Mitral valve: Moderately calcified annulus. There was trivial   regurgitation.     CATH 11/24/2016  Conclusion   Findings: 1. Severe native CAD, including 80% ostial LAD, CTO proximal LCx, and CTO proximal RCA (non-dominant). 2. Widely patient LIMA-LAD. 3. Patent SVG->diagonal with stable 80-90% stenosis in diagonal just distal to SVG anastomosis. 4. Patent SVG->OM1 and OM2 with 80-90% stenosis in jump graft immediately distal to OM1 anastomosis, new since 2016. 5. Ostially occluded SVG->PDA with distal vessel filling via  left-to-left collaterals. 6. Normal left ventricular filling pressure.  Recommendations: 1. Aggressive medical therapy; will add isosorbide mononitrate, to be titrated up as tolerated. 2. If chest pain persists, could consider PCI to SVG-OM1->OM2, though location of disease would put ramus/high OM branch in jeopardy.       ASSESSMENT AND PLAN  1. NSTEMI: Recently underwent cardiac catheterization showed new SVG to OM1-OM 2 lesion, location of the disease would put the high OM branch in jeopardy if consider PCI. We'll discuss with M.D, may need high risk PCI.  2. CAD s/p CABG: Recent cardiac catheterization showed patent LIMA to LAD, high degree disease in SVG to OM1-OM 2 which is new, stable high degree disease in SVG to diagonal, no occluded SVG to PDA  3. Hypertension: Blood pressure stable on current medication. Imdur for antianginal purposes  4. Hyperlipidemia: Continue Lipitor  5. CKD stage III: Creatinine mildly increased, currently 1.5, baseline 1.3.  6. DM 2: On metformin and Actos  7. HIV   Signed, Almyra Deforest, PA-C 11/29/2016, 12:07 PM   Patient seen with PA, agree with the above note.  He was discharged earlier this week after admission for unstable angina with cath showing significant disease with new severe stenosis in sequential SVG-OM1/OM2 in the graft just after touchdown on OM1.  This was thought to be a difficult position for intervention and medical management was recommended, with PCI attempt reserved for medication failure.    About a day after getting home, patient began to have episodes of central chest pain.  This morning, he had a more severe episode that lasted longer, so came to ER.  In ER, troponin noted to be elevated.  Troponin was not elevated during last admission.  ECG with nonspecific changes.  Currently no chest pain.   NSTEMI.  I think that we will need to attempt PCI to SVG-OM1/OM2.  There is some risk to compromising the touchdown on OM1, will  need to review with interventional colleagues.   - Heparin gtt.  - Continue ASA, increase atorvastatin to 80 mg daily.   - Hold metformin, potential cath on Monday morning.  Will need to hydrate prior.   Creatinine mildly elevated at 1.5 (baseline 1.3).  Will hydrate prior to cath.   Loralie Champagne 11/29/2016 2:15 PM

## 2016-11-29 NOTE — ED Provider Notes (Signed)
[facility] DEPT Provider Note   CSN: [id number] Arrival date & time: 11/29/16  6962     History   Chief Complaint Chief Complaint  Patient presents with  . Chest Pain  . Gastroesophageal Reflux    HPI [name] is a 71 y.o. male who presents with chest pain. Past medical history significant for CAD s/p CABG , CKD, COPD, non insulin dependent DM, HIV. He was hospitalized for chest pain last week and had a cath on 4/23. Medical management was recommended. He states that since he was discharged from the hospital he has had worsening intermittent chest pain. He states he does not have pain with exertion. He was also taken off his Protonix and has had worsening reflux and burning in his chest as well. He is unable to tell the difference between anginal chest pain versus reflux. This morning he was getting ready for church when he started having chest pain again and therefore he came to the emergency department for further evaluation. He states it's on the left side of his chest and feels like squeezing. He has shortness of breath when he has chest pain but none currently. He denies fever, syncope, cough, constant chest pain or shortness of breath, nausea or vomiting. His cardiologist is Dr. Stanford Breed.   Note from 4/24 states: Angiography on 4/23 showed severe native CAD with 80% ostial LAD stenosis, CTO of the proximal LCx, and CTO proximal RCA (non-dominant). LIMA-LAD was patent with patent SVG->diagonal with stable 80-90% stenosis in diagonal just distal to SVG anastomosis and patent SVG->OM1 and OM2 with 80-90% stenosis in jump graft immediately distal to OM1 anastomosis, new since 2016. The SVG->PDA was occluded with left-to-left collaterals. Continued medical therapy was recommended and if chest pain persists the possibility of PCI to SVG-OM1->OM2 could be considered.    HPI  Past Medical History:  Diagnosis Date  . Arthritis   . CAD (coronary artery disease)    2003- cabg  .  Chronic kidney disease   . Constipation 05/28/2014  . COPD (chronic obstructive pulmonary disease) ([place])   . Dermatitis 11/27/2012  . Diabetes mellitus   . Epicondylitis 06/05/2013   right  . GERD (gastroesophageal reflux disease)   . History of depression   . History of kidney stones   . HIV (human immunodeficiency virus infection) ([place]) 1991   on meds since initial dx.   . Hyperlipidemia   . Hypertension   . Nasal abscess 12/04/2015  . Pancreatitis 11/2013   attributed to HIV meds.     Patient Active Problem List   Diagnosis Date Noted  . Unstable angina ([place]) 11/24/2016  . Chest pain 11/22/2016  . Aortic stenosis 09/17/2015  . STEMI (ST elevation myocardial infarction) ([place]) 07/29/2015  . Subsequent ST elevation (STEMI) myocardial infarction of inferior wall ([place]) 07/29/2015  . Lesion of breast 07/21/2015  . Carotid bruit 07/10/2015  . Metatarsal deformity 01/24/2015  . Keratoma 01/24/2015  . Pain in joint, ankle and foot 01/19/2015  . Absolute anemia 05/28/2014  . Constipation 05/28/2014  . Pedal edema 05/21/2014  . Posterior cervical lymphadenopathy 03/30/2014  . Hyperkalemia 03/23/2014  . Hyponatremia 03/23/2014  . Protein-calorie malnutrition, severe ([place]) 03/23/2014  . Diarrhea 11/20/2013  . HLD (hyperlipidemia) 11/19/2013  . Ascites 11/18/2013  . Pancreatitis 11/15/2013  . Epicondylitis 06/05/2013  . Nocturia 03/06/2013  . Dermatitis 11/27/2012  . Night sweats 07/06/2012  . Erectile dysfunction 01/01/2012  . Lipodystrophy 12/20/2010  . Dry eye syndrome 12/20/2010  . BELLS PALSY  07/19/2010  . GENITAL HERPES 05/03/2009  . SHINGLES, HX OF 05/03/2009  . WEIGHT LOSS, ABNORMAL 04/03/2009  . INGUINAL LYMPHADENOPATHY, RIGHT 04/03/2009  . MEMORY LOSS 09/18/2008  . PERIPHERAL VASCULAR DISEASE 07/20/2008  . COPD 07/20/2008  . HIP PAIN, BILATERAL 07/17/2008  . KNEE PAIN, BILATERAL 07/17/2008  . HEMATOCHEZIA 04/18/2008  . NEPHROLITHIASIS, HX OF 02/22/2008  . DM  (diabetes mellitus), type 2 with ophthalmic complications ([place]) 62/70/3500  . DEPRESSION 09/03/2006  . GERD 09/03/2006  . CORONARY ARTERY BYPASS GRAFT, HX OF 09/03/2006  . Human immunodeficiency virus (HIV) disease ([place]) 05/12/2006  . HEARING LOSS, SENSORINEURAL 05/12/2006  . Essential hypertension 05/12/2006  . Coronary atherosclerosis 05/12/2006    Past Surgical History:  Procedure Laterality Date  . CARDIAC CATHETERIZATION N/A 07/29/2015   Procedure: Left Heart Cath and Coronary Angiography;  Surgeon: [name], MD;  Location: [facility];  Service: Cardiovascular;  Laterality: N/A;  . CARDIAC CATHETERIZATION N/A 07/29/2015   Procedure: Coronary Stent Intervention;  Surgeon: [name], MD;  Location: [facility];  Service: Cardiovascular;  Laterality: N/A;  . CORONARY ARTERY BYPASS GRAFT  05/2002  . LEFT HEART CATH AND CORS/GRAFTS ANGIOGRAPHY N/A 11/24/2016   Procedure: Left Heart Cath and Cors/Grafts Angiography;  Surgeon: Nelva Bush, MD;  Location: [facility];  Service: Cardiovascular;  Laterality: N/A;  . VASECTOMY         Home Medications    Prior to Admission medications   Medication Sig Start Date End Date Taking? Authorizing Provider  amLODipine (NORVASC) 10 MG tablet Take 0.5 tablets (5 mg total) by mouth at bedtime. 11/25/16   Erma Heritage, PA-C  aspirin 81 MG chewable tablet Chew 81 mg by mouth daily.    Historical Provider, MD  atorvastatin (LIPITOR) 20 MG tablet Take 1 tablet (20 mg total) by mouth daily. 09/15/16   Lelon Perla, MD  b complex vitamins tablet Take 1 tablet by mouth daily. 09/25/16   Mosie Lukes, MD  Cholecalciferol 2000 units CAPS Take 1 capsule (2,000 Units total) by mouth daily. 09/25/16   Mosie Lukes, MD  diclofenac (VOLTAREN) 50 MG EC tablet TAKE 1 TABLET(50 MG) BY MOUTH TWICE DAILY 06/25/16   Mosie Lukes, MD  furosemide (LASIX) 20 MG tablet TAKE 2 TABLETS BY MOUTH DAILY AS NEEDED FOR FLUID  RETENTION OR SWELLING 03/17/16   Mosie Lukes, MD  glucose blood (FREESTYLE TEST STRIPS) test strip Use three times daily to check blood sugar.  DX E11.9 03/17/16   Mosie Lukes, MD  Insulin Glargine (TOUJEO SOLOSTAR) 300 UNIT/ML SOPN Inject 10 Units into the skin daily. Patient not taking: Reported on 11/22/2016 06/11/16   Elayne Snare, MD  Insulin Pen Needle (B-D UF III MINI PEN NEEDLES) 31G X 5 MM MISC 1 each by Does not apply route 2 (two) times daily. 06/11/16   Elayne Snare, MD  isosorbide mononitrate (IMDUR) 30 MG 24 hr tablet Take 1 tablet (30 mg total) by mouth daily. 11/26/16   Erma Heritage, PA-C  Krill Oil 300 MG CAPS Take 1 capsule (300 mg total) by mouth daily. 09/25/16   Mosie Lukes, MD  Lancets (FREESTYLE) lancets Use as directed three times a day to check blood sugar.  DX E11.9 02/25/16   Mosie Lukes, MD  lisinopril (PRINIVIL,ZESTRIL) 5 MG tablet TAKE 1/2 TABLET BY MOUTH DAILY Patient taking differently: 2.5mg  by mount every other day 11/03/16   Mosie Lukes, MD  metFORMIN (GLUCOPHAGE-XR) 500 MG 24 hr tablet Take 2 tablets (1,000 mg total) by mouth daily with supper. 09/26/16   Elayne Snare, MD  metolazone (ZAROXOLYN) 2.5 MG tablet TAKE 1 TABLET BY MOUTH ONCE DAILY 20 MINUTES PRIOR TO FUROSEMIDE 11/07/16   Mosie Lukes, MD  metoprolol tartrate (LOPRESSOR) 25 MG tablet Take 0.5 tablets (12.5 mg total) by mouth 2 (two) times daily. 11/25/16   Erma Heritage, PA-C  neomycin-polymyxin-hydrocortisone (CORTISPORIN) otic solution INSTILL 3 DROPS IN BOTH EARS THREE TIMES DAILY 07/07/16   Mosie Lukes, MD  nitroGLYCERIN (NITROSTAT) 0.4 MG SL tablet Place 1 tablet (0.4 mg total) under the tongue every 5 (five) minutes as needed for chest pain. 09/29/16   Lelon Perla, MD  NORVIR 100 MG TABS tablet TAKE 1 TABLET(100 MG) BY MOUTH DAILY 05/14/16   Michel Bickers, MD  omega-3 acid ethyl esters (LOVAZA) 1 g capsule Take 1 capsule (1 g total) by mouth 2 (two) times daily. 11/07/16   Elayne Snare,  MD  pioglitazone (ACTOS) 15 MG tablet TAKE 1 TABLET(15 MG) BY MOUTH DAILY 11/05/16   Elayne Snare, MD  potassium chloride SA (K-DUR,KLOR-CON) 20 MEQ tablet TAKE 1 TABLET(20 MEQ) BY MOUTH THREE TIMES DAILY 11/20/16   Dorothyann Peng, NP  PREZISTA 800 MG tablet TAKE 1 TABLET(800 MG) BY MOUTH DAILY 06/12/16   Michel Bickers, MD  Probiotic Product (PROBIOTIC DAILY) CAPS Take 1 by mouth daily 09/25/16   Mosie Lukes, MD  repaglinide (PRANDIN) 2 MG tablet TAKE 2 TABLETS BY MOUTH BEFORE BREAKFAST, 1 TABLET BY MOUTH BEFORE LUNCH AND 2 TABLETS BY MOUTH BEFORE SUPPER 09/15/16   Elayne Snare, MD  rilpivirine (EDURANT) 25 MG TABS tablet Take 1 tablet (25 mg total) by mouth daily with breakfast. 10/07/16   Michel Bickers, MD  sodium bicarbonate 650 MG tablet Take 650 mg by mouth 3 (three) times daily.    Historical Provider, MD  TIVICAY 50 MG tablet TAKE 1 TABLET BY MOUTH DAILY 10/07/16   Michel Bickers, MD  triamcinolone cream (KENALOG) 0.1 % APP TO DRY ITCHY SKIN BID PRN 10/15/15   Historical Provider, MD  valACYclovir (VALTREX) 500 MG tablet TAKE 1 TABLET BY MOUTH DAILY 11/13/16   Michel Bickers, MD    Family History Family History  Problem Relation Age of Onset  . Hyperlipidemia Mother   . Diabetes Mother     paternal grandparents/1 brother  . Hyperlipidemia Father   . Hypertension Father     paternal grandmother/3 brothers/1 sister  . Arthritis      mother/father/paternal grandparents  . Breast cancer Maternal Aunt     paternal aunt  . Lung cancer Maternal Aunt   . Heart disease      parents/maternal grandparents/ 2 brothers  . Stroke Paternal Grandmother   . Mental retardation Sister     Social History Social History  Substance Use Topics  . Smoking status: Never Smoker  . Smokeless tobacco: Never Used  . Alcohol use 0.0 oz/week     Comment: rare     Allergies   Patient has no known allergies.   Review of Systems Review of Systems  Constitutional: Negative for chills and fever.  Respiratory:  Positive for shortness of breath. Negative for cough.   Cardiovascular: Positive for chest pain. Negative for palpitations and leg swelling.  Gastrointestinal: Negative for abdominal pain, nausea and vomiting.  Neurological: Negative for syncope.  All other systems reviewed and are negative.    Physical Exam Updated Vital Signs  BP 130/73 (BP Location: Right Arm)   Pulse 66   Temp 98.7 F (37.1 C) (Oral)   Resp 16   Ht 5\' 3"  (1.6 m)   Wt 59.9 kg   SpO2 100%   BMI 23.38 kg/m   Physical Exam  Constitutional: He is oriented to person, place, and time. He appears well-developed and well-nourished. No distress.  HENT:  Head: Normocephalic and atraumatic.  Eyes: Conjunctivae are normal. Pupils are equal, round, and reactive to light. Right eye exhibits no discharge. Left eye exhibits no discharge. No scleral icterus.  Neck: Normal range of motion.  Cardiovascular: Normal rate and regular rhythm.  Exam reveals no gallop and no friction rub.   No murmur heard. Pulmonary/Chest: Effort normal and breath sounds normal. No respiratory distress. He has no wheezes. He has no rales. He exhibits no tenderness.  Abdominal: Soft. Bowel sounds are normal. He exhibits no distension and no mass. There is no tenderness. There is no rebound and no guarding. No hernia.  Neurological: He is alert and oriented to person, place, and time.  Skin: Skin is warm and dry.  Psychiatric: He has a normal mood and affect. His behavior is normal.  Nursing note and vitals reviewed.    ED Treatments / Results  Labs (all labs ordered are listed, but only abnormal results are displayed) Labs Reviewed  BASIC METABOLIC PANEL - Abnormal; Notable for the following:       Result Value   Glucose, Bld 152 (*)    BUN 34 (*)    Creatinine, Ser 1.53 (*)    GFR calc non Af Amer 45 (*)    GFR calc Af Amer 52 (*)    All other components within normal limits  CBC - Abnormal; Notable for the following:    RBC 3.98 (*)     Hemoglobin 12.7 (*)    HCT 37.6 (*)    All other components within normal limits  I-STAT TROPOININ, ED - Abnormal; Notable for the following:    Troponin i, poc 1.96 (*)    All other components within normal limits  HEPARIN LEVEL (UNFRACTIONATED)    EKG  EKG Interpretation  Date/Time:  Saturday November 29 2016 08:40:41 EDT Ventricular Rate:  70 PR Interval:  206 QRS Duration: 100 QT Interval:  410 QTC Calculation: 442 R Axis:   54 Text Interpretation:  Normal sinus rhythm Non-specific ST-t changes v5 and v6 Possible Inferior infarct , age undetermined Cannot rule out Anterior infarct , age undetermined Abnormal ECG Confirmed by RAY MD, DANIELLE [phone]) on 11/29/2016 9:13:41 AM       Radiology Dg Chest 2 View  Result Date: 11/29/2016 CLINICAL DATA:  Chest pain and hypertension EXAM: CHEST  2 VIEW COMPARISON:  November 22, 2016 FINDINGS: There is slight scarring in the left base. Lungs elsewhere are clear. Heart size and pulmonary vascularity are normal. No adenopathy. Patient is status post coronary artery bypass grafting. No adenopathy. No bone lesions. IMPRESSION: Slight scarring left base.  No edema or consolidation. Electronically Signed   By: Lowella Grip III M.D.   On: 11/29/2016 10:08    Procedures Procedures (including critical care time)  Medications Ordered in ED Medications  heparin ADULT infusion 100 units/mL (25000 units/271mL sodium chloride 0.45%) (750 Units/hr Intravenous New Bag/Given 11/29/16 0953)  aspirin chewable tablet 324 mg (324 mg Oral Given 11/29/16 0940)  heparin bolus via infusion 3,500 Units (3,500 Units Intravenous Bolus from Bag 11/29/16 0957)     Initial Impression /  Assessment and Plan / ED Course  I have reviewed the triage vital signs and the nursing notes.  Pertinent labs & imaging results that were available during my care of the patient were reviewed by me and considered in my medical decision making (see chart for details).  71 year old  male presents with chest pain due to NSTEMI. Vitals are normal and he is in NAD. EKG shows ST changes and troponin is 1.96. CBC remarkable for mild anemia. BMP remarkable for hyperglycemia and elevated SCr from baseline (1.53). CXR negative. ASA and heparin started. Spoke with Dr. Aundra Dubin who will see patient.   Final Clinical Impressions(s) / ED Diagnoses   Final diagnoses:  NSTEMI (non-ST elevated myocardial infarction) [facility])    New Prescriptions New Prescriptions   No medications on file     Recardo Evangelist, PA-C 11/29/16 [place], MD 12/01/16 [phone]

## 2016-11-30 DIAGNOSIS — N183 Chronic kidney disease, stage 3 (moderate): Secondary | ICD-10-CM | POA: Diagnosis not present

## 2016-11-30 DIAGNOSIS — Z87442 Personal history of urinary calculi: Secondary | ICD-10-CM | POA: Diagnosis not present

## 2016-11-30 DIAGNOSIS — E785 Hyperlipidemia, unspecified: Secondary | ICD-10-CM | POA: Diagnosis not present

## 2016-11-30 DIAGNOSIS — K219 Gastro-esophageal reflux disease without esophagitis: Secondary | ICD-10-CM | POA: Diagnosis not present

## 2016-11-30 DIAGNOSIS — Z794 Long term (current) use of insulin: Secondary | ICD-10-CM | POA: Diagnosis not present

## 2016-11-30 DIAGNOSIS — Z833 Family history of diabetes mellitus: Secondary | ICD-10-CM | POA: Diagnosis not present

## 2016-11-30 DIAGNOSIS — I214 Non-ST elevation (NSTEMI) myocardial infarction: Secondary | ICD-10-CM | POA: Diagnosis not present

## 2016-11-30 DIAGNOSIS — I252 Old myocardial infarction: Secondary | ICD-10-CM | POA: Diagnosis not present

## 2016-11-30 DIAGNOSIS — B20 Human immunodeficiency virus [HIV] disease: Secondary | ICD-10-CM | POA: Diagnosis not present

## 2016-11-30 DIAGNOSIS — M1990 Unspecified osteoarthritis, unspecified site: Secondary | ICD-10-CM | POA: Diagnosis present

## 2016-11-30 DIAGNOSIS — Z951 Presence of aortocoronary bypass graft: Secondary | ICD-10-CM | POA: Diagnosis not present

## 2016-11-30 DIAGNOSIS — Z823 Family history of stroke: Secondary | ICD-10-CM | POA: Diagnosis not present

## 2016-11-30 DIAGNOSIS — Z955 Presence of coronary angioplasty implant and graft: Secondary | ICD-10-CM | POA: Diagnosis not present

## 2016-11-30 DIAGNOSIS — I2583 Coronary atherosclerosis due to lipid rich plaque: Secondary | ICD-10-CM | POA: Diagnosis not present

## 2016-11-30 DIAGNOSIS — R079 Chest pain, unspecified: Secondary | ICD-10-CM | POA: Diagnosis not present

## 2016-11-30 DIAGNOSIS — E1139 Type 2 diabetes mellitus with other diabetic ophthalmic complication: Secondary | ICD-10-CM | POA: Diagnosis not present

## 2016-11-30 DIAGNOSIS — I129 Hypertensive chronic kidney disease with stage 1 through stage 4 chronic kidney disease, or unspecified chronic kidney disease: Secondary | ICD-10-CM | POA: Diagnosis not present

## 2016-11-30 DIAGNOSIS — Z8261 Family history of arthritis: Secondary | ICD-10-CM | POA: Diagnosis not present

## 2016-11-30 DIAGNOSIS — Z8249 Family history of ischemic heart disease and other diseases of the circulatory system: Secondary | ICD-10-CM | POA: Diagnosis not present

## 2016-11-30 DIAGNOSIS — Z810 Family history of intellectual disabilities: Secondary | ICD-10-CM | POA: Diagnosis not present

## 2016-11-30 DIAGNOSIS — Z21 Asymptomatic human immunodeficiency virus [HIV] infection status: Secondary | ICD-10-CM | POA: Diagnosis present

## 2016-11-30 DIAGNOSIS — J449 Chronic obstructive pulmonary disease, unspecified: Secondary | ICD-10-CM | POA: Diagnosis present

## 2016-11-30 DIAGNOSIS — E1122 Type 2 diabetes mellitus with diabetic chronic kidney disease: Secondary | ICD-10-CM | POA: Diagnosis not present

## 2016-11-30 DIAGNOSIS — I25810 Atherosclerosis of coronary artery bypass graft(s) without angina pectoris: Secondary | ICD-10-CM | POA: Diagnosis not present

## 2016-11-30 DIAGNOSIS — Z79899 Other long term (current) drug therapy: Secondary | ICD-10-CM | POA: Diagnosis not present

## 2016-11-30 DIAGNOSIS — Z7982 Long term (current) use of aspirin: Secondary | ICD-10-CM | POA: Diagnosis not present

## 2016-11-30 LAB — CBC
HEMATOCRIT: 34.4 % — AB (ref 39.0–52.0)
HEMOGLOBIN: 11.7 g/dL — AB (ref 13.0–17.0)
MCH: 32.3 pg (ref 26.0–34.0)
MCHC: 34 g/dL (ref 30.0–36.0)
MCV: 95 fL (ref 78.0–100.0)
Platelets: 187 10*3/uL (ref 150–400)
RBC: 3.62 MIL/uL — ABNORMAL LOW (ref 4.22–5.81)
RDW: 15.2 % (ref 11.5–15.5)
WBC: 5.5 10*3/uL (ref 4.0–10.5)

## 2016-11-30 LAB — HEPARIN LEVEL (UNFRACTIONATED)
HEPARIN UNFRACTIONATED: 0.17 [IU]/mL — AB (ref 0.30–0.70)
HEPARIN UNFRACTIONATED: 0.32 [IU]/mL (ref 0.30–0.70)
Heparin Unfractionated: 0.51 IU/mL (ref 0.30–0.70)

## 2016-11-30 LAB — BASIC METABOLIC PANEL
Anion gap: 12 (ref 5–15)
BUN: 29 mg/dL — AB (ref 6–20)
CHLORIDE: 103 mmol/L (ref 101–111)
CO2: 23 mmol/L (ref 22–32)
CREATININE: 1.5 mg/dL — AB (ref 0.61–1.24)
Calcium: 9.1 mg/dL (ref 8.9–10.3)
GFR calc Af Amer: 53 mL/min — ABNORMAL LOW (ref >60)
GFR, EST NON AFRICAN AMERICAN: 46 mL/min — AB (ref >60)
Glucose, Bld: 154 mg/dL — ABNORMAL HIGH (ref 65–99)
POTASSIUM: 3.8 mmol/L (ref 3.5–5.1)
Sodium: 138 mmol/L (ref 135–145)

## 2016-11-30 LAB — TROPONIN I
Troponin I: 2.65 ng/mL (ref <0.03)
Troponin I: 2.8 ng/mL (ref <0.03)

## 2016-11-30 LAB — GLUCOSE, CAPILLARY
Glucose-Capillary: 136 mg/dL — ABNORMAL HIGH (ref 65–99)
Glucose-Capillary: 220 mg/dL — ABNORMAL HIGH (ref 65–99)
Glucose-Capillary: 219 mg/dL — ABNORMAL HIGH (ref 65–99)

## 2016-11-30 MED ORDER — SODIUM CHLORIDE 0.9 % IV SOLN
INTRAVENOUS | Status: DC
  Administered 2016-11-30: 17:00:00 via INTRAVENOUS

## 2016-11-30 MED ORDER — ASPIRIN 81 MG PO CHEW
81.0000 mg | CHEWABLE_TABLET | ORAL | Status: AC
  Administered 2016-12-01: 81 mg via ORAL
  Filled 2016-11-30: qty 1

## 2016-11-30 MED ORDER — SODIUM CHLORIDE 0.9 % IV SOLN: 250.0000 mL | INTRAVENOUS | Status: DC | PRN

## 2016-11-30 MED ORDER — SODIUM CHLORIDE 0.9 % IV SOLN
INTRAVENOUS | Status: DC
  Administered 2016-11-30: 09:00:00 via INTRAVENOUS

## 2016-11-30 MED ORDER — ALUM & MAG HYDROXIDE-SIMETH 200-200-20 MG/5ML PO SUSP
30.0000 mL | ORAL | Status: DC | PRN
  Administered 2016-11-30 – 2016-12-01 (×2): 30 mL via ORAL
  Filled 2016-11-30 (×2): qty 30

## 2016-11-30 MED ORDER — SODIUM CHLORIDE 0.9% FLUSH
3.0000 mL | Freq: Two times a day (BID) | INTRAVENOUS | Status: DC
  Administered 2016-11-30: 3 mL via INTRAVENOUS

## 2016-11-30 MED ORDER — SODIUM CHLORIDE 0.9% FLUSH: 3.0000 mL | INTRAVENOUS | Status: DC | PRN

## 2016-11-30 MED ORDER — INSULIN ASPART 100 UNIT/ML ~~LOC~~ SOLN
0.0000 [IU] | Freq: Three times a day (TID) | SUBCUTANEOUS | Status: DC
  Administered 2016-11-30: 3 [IU] via SUBCUTANEOUS
  Administered 2016-12-01: 18:00:00 8 [IU] via SUBCUTANEOUS
  Administered 2016-12-01: 1 [IU] via SUBCUTANEOUS
  Administered 2016-12-02: 07:00:00 2 [IU] via SUBCUTANEOUS

## 2016-11-30 NOTE — Progress Notes (Signed)
ANTICOAGULATION CONSULT NOTE - Follow Up Consult  Pharmacy Consult for Heparin  Indication: chest pain/ACS  No Known Allergies  Patient Measurements: Height: 5\' 3"  (160 cm) Weight: 135 lb 11.2 oz (61.6 kg) IBW/kg (Calculated) : 56.9  Vital Signs: Temp: 98.9 F (37.2 C) (04/28 1637) Temp Source: Oral (04/28 1637) BP: 104/72 (04/28 2210) Pulse Rate: 88 (04/28 2210)  Labs:  Recent Labs  11/29/16 0909 11/29/16 1119 11/29/16 1643 11/29/16 1648 11/29/16 2335  HGB 12.7*  --   --   --   --   HCT 37.6*  --   --   --   --   PLT 204  --   --   --   --   HEPARINUNFRC  --   --  0.20*  --  0.17*  CREATININE 1.53*  --   --   --   --   TROPONINI  --  1.88*  --  2.30* 2.80*    Estimated Creatinine Clearance: 37.2 mL/min (A) (by C-G formula based on SCr of 1.53 mg/dL (H)).  Assessment: Heparin for CP/elevated troponin, possible cath Monday, heparin level low despite rate increase, no issues per RN.   Goal of Therapy:  Heparin level 0.3-0.7 units/ml Monitor platelets by anticoagulation protocol: Yes   Plan:  -Inc heparin to 1100 units/hr -0900 HL  Narda Bonds 11/30/2016,12:47 AM

## 2016-11-30 NOTE — Progress Notes (Signed)
ANTICOAGULATION CONSULT NOTE - Initial Consult  Pharmacy Consult for heparin Indication: chest pain/ACS  No Known Allergies  Patient Measurements: Height: 5\' 3"  (160 cm) Weight: 129 lb 9.6 oz (58.8 kg) IBW/kg (Calculated) : 56.9 Heparin Dosing Weight: 60 kg  Vital Signs: Temp: 97.7 F (36.5 C) (04/29 0926) Temp Source: Oral (04/29 0926) BP: 108/70 (04/29 0926) Pulse Rate: 76 (04/29 0926)  Assessment: 71 yo M presents on 4/28 with chest pain. Recently discharged, [place] on 4/23 c/w severe stenosis of SVG-OM1/2. Troponin elevated in the ED. Pharmacy consulted to start heparin. No anticoag PTA. CBC a week ago showed Hgb of 11.5, plts 189  HL therapeutic this am but taken 4 hours after dose increase. Subsequent HL therapeutic at 0.51. Hgb with slight trend down, plt wnl and stable. No reported significant s/s bleeding.   Goal of Therapy:  Heparin level 0.3-0.7 units/ml Monitor platelets by anticoagulation protocol: Yes   Plan:  - Continue heparin gtt at 1100 units/hr - 6 hr HL to confirm - Daily HL, CBC - F/u after PCI on Monday  [name], PharmD, BCPS, [name] Clinical Pharmacist Pager: [phone] Phone: [phone] 11/30/2016 10:40 AM

## 2016-11-30 NOTE — Progress Notes (Signed)
Subjective:  Denies SSCP, palpitations or Dyspnea   Objective:  Vitals:   11/29/16 1600 11/29/16 1637 11/29/16 2210 11/30/16 0540  BP: 123/73 139/70 104/72   Pulse: 75 71 88   Resp: 15 16    Temp:  98.9 F (37.2 C)  97.9 F (36.6 C)  TempSrc:  Oral  Oral  SpO2: 94% 98%    Weight:  61.6 kg (135 lb 11.2 oz)  58.8 kg (129 lb 9.6 oz)  Height:  5\' 3"  (1.6 m)      Intake/Output from previous day:  Intake/Output Summary (Last 24 hours) at 11/30/16 8341 Last data filed at 11/30/16 0600  Gross per 24 hour  Intake              247 ml  Output              450 ml  Net             -203 ml    Physical Exam: Affect appropriate Healthy:  appears stated age HEENT: normal Neck supple with no adenopathy JVP normal no bruits no thyromegaly Lungs clear with no wheezing and good diaphragmatic motion Heart:  S1/S2 no murmur, no rub, gallop or click PMI normal Abdomen: benighn, BS positve, no tenderness, no AAA no bruit.  No HSM or HJR Distal pulses intact with no bruits No edema Neuro non-focal Skin warm and dry No muscular weakness Poor right radial pulse   Lab Results: Basic Metabolic Panel:  Recent Labs  11/29/16 0909 11/30/16 0426  NA 140 138  K 4.4 3.8  CL 102 103  CO2 23 23  GLUCOSE 152* 154*  BUN 34* 29*  CREATININE 1.53* 1.50*  CALCIUM 10.0 9.1   CBC:  Recent Labs  11/29/16 0909 11/30/16 0426  WBC 6.1 5.5  HGB 12.7* 11.7*  HCT 37.6* 34.4*  MCV 94.5 95.0  PLT 204 187   Cardiac Enzymes:  Recent Labs  11/29/16 1648 11/29/16 2335 11/30/16 0426  TROPONINI 2.30* 2.80* 2.65*    Imaging: Dg Chest 2 View  Result Date: 11/29/2016 CLINICAL DATA:  Chest pain and hypertension EXAM: CHEST  2 VIEW COMPARISON:  November 22, 2016 FINDINGS: There is slight scarring in the left base. Lungs elsewhere are clear. Heart size and pulmonary vascularity are normal. No adenopathy. Patient is status post coronary artery bypass grafting. No adenopathy. No bone lesions.  IMPRESSION: Slight scarring left base.  No edema or consolidation. Electronically Signed   By: Lowella Grip III M.D.   On: 11/29/2016 10:08    Cardiac Studies:  ECG: NSR small Q waves 2,3,F no acute ST changes    Telemetry:  NSR  11/30/2016 personally reviewed   Echo:   Medications:   . amLODipine  5 mg Oral QHS  . aspirin  81 mg Oral Daily  . atorvastatin  80 mg Oral q1800  . cholecalciferol  1,000 Units Oral Daily  . darunavir  800 mg Oral Q breakfast  . diclofenac  50 mg Oral BID WC  . dolutegravir  50 mg Oral Daily  . isosorbide mononitrate  30 mg Oral Daily  . lisinopril  2.5 mg Oral Daily  . metoprolol tartrate  12.5 mg Oral BID  . neomycin-polymyxin-hydrocortisone  3 drop Both Ears Q8H  . omega-3 acid ethyl esters  1 g Oral BID  . potassium chloride SA  20 mEq Oral TID  . rilpivirine  25 mg Oral Q breakfast  . ritonavir  100 mg Oral  Q breakfast  . sodium bicarbonate  650 mg Oral TID  . valACYclovir  500 mg Oral Daily     . heparin 1,100 Units/hr (11/30/16 0101)    Assessment/Plan:  SEMI:  Post CABG cath 11/24/16 with 80-90% stenosis in jump graft immediately distal to OM1 anastomosis. Failure of medical Rx  Troponin coming down peak 2.8 See note Dr Aundra Dubin  Continue heparin for PCI/Stent SVG OM in am will need to be reviewed by Dr Ellyn Hack or Heron Nay for CRF Cr 1.5 and stable Previous cath done from RFA has poor right radial with grafts Left radial ok but may need the support from leg  Jenkins Rouge 11/30/2016, 9:05 AM

## 2016-11-30 NOTE — Progress Notes (Signed)
Risk and benefit of cardiac catheterization explained to the patient who display clear understanding and agree to proceed.  Discussed with patient possible procedural risk include bleeding, vascular injury, renal injury, arrythmia, MI, stroke and loss of limb or life.  Hilbert Corrigan PA Pager: [phone]

## 2016-11-30 NOTE — Plan of Care (Signed)
Problem: Safety: Goal: Ability to remain free from injury will improve Outcome: Completed/Met Date Met: 11/30/16 Patient uses call light appropriately and has personal items on bedside stand within reach.

## 2016-11-30 NOTE — Care Management Obs Status (Signed)
[place] NOTIFICATION   Patient Details  Name: TREGAN READ MRN: [id number] Date of Birth: 1945-10-11   Medicare Observation Status Notification Given:  Yes    Maryclare Labrador, RN 11/30/2016, 2:58 PM

## 2016-12-01 ENCOUNTER — Encounter (HOSPITAL_COMMUNITY): Payer: Self-pay | Admitting: Cardiovascular Disease

## 2016-12-01 ENCOUNTER — Encounter (HOSPITAL_COMMUNITY)
Admission: EM | Disposition: A | Discharge status: Home / Self Care | Payer: Self-pay | Source: Home / Self Care | Attending: Cardiology

## 2016-12-01 DIAGNOSIS — I25810 Atherosclerosis of coronary artery bypass graft(s) without angina pectoris: Secondary | ICD-10-CM

## 2016-12-01 HISTORY — PX: CORONARY STENT INTERVENTION: CATH118234

## 2016-12-01 HISTORY — PX: LEFT HEART CATH AND CORS/GRAFTS ANGIOGRAPHY: CATH118250

## 2016-12-01 LAB — POCT ACTIVATED CLOTTING TIME
ACTIVATED CLOTTING TIME: 351 s
ACTIVATED CLOTTING TIME: 180 s
ACTIVATED CLOTTING TIME: 153 s

## 2016-12-01 LAB — GLUCOSE, CAPILLARY
Glucose-Capillary: 218 mg/dL — ABNORMAL HIGH (ref 65–99)
Glucose-Capillary: 142 mg/dL — ABNORMAL HIGH (ref 65–99)
Glucose-Capillary: 94 mg/dL (ref 65–99)
Glucose-Capillary: 186 mg/dL — ABNORMAL HIGH (ref 65–99)

## 2016-12-01 LAB — CBC
HEMATOCRIT: 30.8 % — AB (ref 39.0–52.0)
HEMOGLOBIN: 10.5 g/dL — AB (ref 13.0–17.0)
MCH: 32.3 pg (ref 26.0–34.0)
MCHC: 34.1 g/dL (ref 30.0–36.0)
MCV: 94.8 fL (ref 78.0–100.0)
Platelets: 149 10*3/uL — ABNORMAL LOW (ref 150–400)
RBC: 3.25 MIL/uL — AB (ref 4.22–5.81)
RDW: 15.3 % (ref 11.5–15.5)
WBC: 4.4 10*3/uL (ref 4.0–10.5)

## 2016-12-01 LAB — BASIC METABOLIC PANEL
ANION GAP: 9 (ref 5–15)
BUN: 29 mg/dL — ABNORMAL HIGH (ref 6–20)
CALCIUM: 8.6 mg/dL — AB (ref 8.9–10.3)
CO2: 21 mmol/L — AB (ref 22–32)
Chloride: 110 mmol/L (ref 101–111)
Creatinine, Ser: 1.59 mg/dL — ABNORMAL HIGH (ref 0.61–1.24)
GFR calc non Af Amer: 43 mL/min — ABNORMAL LOW (ref >60)
GFR, EST AFRICAN AMERICAN: 50 mL/min — AB (ref >60)
Glucose, Bld: 203 mg/dL — ABNORMAL HIGH (ref 65–99)
POTASSIUM: 4.3 mmol/L (ref 3.5–5.1)
Sodium: 140 mmol/L (ref 135–145)

## 2016-12-01 LAB — HEPARIN LEVEL (UNFRACTIONATED): Heparin Unfractionated: 0.4 IU/mL (ref 0.30–0.70)

## 2016-12-01 SURGERY — LEFT HEART CATH AND CORS/GRAFTS ANGIOGRAPHY
Anesthesia: LOCAL

## 2016-12-01 MED ORDER — BIVALIRUDIN TRIFLUOROACETATE 250 MG IV SOLR
INTRAVENOUS | Status: AC
  Filled 2016-12-01: qty 250

## 2016-12-01 MED ORDER — FENTANYL CITRATE (PF) 100 MCG/2ML IJ SOLN
INTRAMUSCULAR | Status: DC | PRN
  Administered 2016-12-01: 25 ug via INTRAVENOUS

## 2016-12-01 MED ORDER — TICAGRELOR 90 MG PO TABS
ORAL_TABLET | ORAL | Status: AC
  Filled 2016-12-01: qty 2

## 2016-12-01 MED ORDER — MIDAZOLAM HCL 2 MG/2ML IJ SOLN
INTRAMUSCULAR | Status: DC | PRN
  Administered 2016-12-01: 1 mg via INTRAVENOUS

## 2016-12-01 MED ORDER — LORAZEPAM 0.5 MG PO TABS
0.5000 mg | ORAL_TABLET | Freq: Once | ORAL | Status: AC
  Administered 2016-12-01: 0.5 mg via ORAL
  Filled 2016-12-01: qty 1

## 2016-12-01 MED ORDER — FENTANYL CITRATE (PF) 100 MCG/2ML IJ SOLN
INTRAMUSCULAR | Status: AC
  Filled 2016-12-01: qty 2

## 2016-12-01 MED ORDER — LABETALOL HCL 5 MG/ML IV SOLN: 10.0000 mg | INTRAVENOUS | Status: DC | PRN

## 2016-12-01 MED ORDER — LIDOCAINE HCL 1 % IJ SOLN
INTRAMUSCULAR | Status: AC
  Filled 2016-12-01: qty 20

## 2016-12-01 MED ORDER — VERAPAMIL HCL 2.5 MG/ML IV SOLN
INTRAVENOUS | Status: DC | PRN
  Administered 2016-12-01: 200 ug via INTRACORONARY

## 2016-12-01 MED ORDER — SODIUM CHLORIDE 0.9 % IV SOLN: 250.0000 mL | INTRAVENOUS | Status: DC | PRN

## 2016-12-01 MED ORDER — VERAPAMIL HCL 2.5 MG/ML IV SOLN
INTRAVENOUS | Status: AC
  Filled 2016-12-01: qty 2

## 2016-12-01 MED ORDER — HYDRALAZINE HCL 20 MG/ML IJ SOLN: 10.0000 mg | INTRAMUSCULAR | Status: DC | PRN

## 2016-12-01 MED ORDER — MORPHINE SULFATE (PF) 2 MG/ML IV SOLN: 2.0000 mg | INTRAVENOUS | Status: DC | PRN

## 2016-12-01 MED ORDER — ONDANSETRON HCL 4 MG/2ML IJ SOLN: 4.0000 mg | Freq: Four times a day (QID) | INTRAMUSCULAR | Status: DC | PRN

## 2016-12-01 MED ORDER — IOPAMIDOL (ISOVUE-370) INJECTION 76%
INTRAVENOUS | Status: DC | PRN
  Administered 2016-12-01: 125 mL via INTRA_ARTERIAL

## 2016-12-01 MED ORDER — ANGIOPLASTY BOOK
Freq: Once | Status: AC
  Administered 2016-12-01: 23:00:00
  Filled 2016-12-01: qty 1

## 2016-12-01 MED ORDER — MIDAZOLAM HCL 2 MG/2ML IJ SOLN
INTRAMUSCULAR | Status: AC
  Filled 2016-12-01: qty 2

## 2016-12-01 MED ORDER — ASPIRIN 81 MG PO CHEW
81.0000 mg | CHEWABLE_TABLET | Freq: Every day | ORAL | Status: DC
  Administered 2016-12-02: 81 mg via ORAL
  Filled 2016-12-01: qty 1

## 2016-12-01 MED ORDER — BIVALIRUDIN TRIFLUOROACETATE 250 MG IV SOLR
1.7500 mg/kg/h | INTRAVENOUS | Status: DC
  Administered 2016-12-01 (×2): 1.75 mg/kg/h via INTRAVENOUS
  Filled 2016-12-01 (×3): qty 250

## 2016-12-01 MED ORDER — IOPAMIDOL (ISOVUE-370) INJECTION 76%
INTRAVENOUS | Status: AC
  Filled 2016-12-01: qty 125

## 2016-12-01 MED ORDER — SODIUM CHLORIDE 0.9 % IV SOLN
INTRAVENOUS | Status: DC
Start: 2016-12-01 — End: 2016-12-01
  Administered 2016-12-01: 10:00:00 via INTRAVENOUS

## 2016-12-01 MED ORDER — IOPAMIDOL (ISOVUE-370) INJECTION 76%
INTRAVENOUS | Status: AC
  Filled 2016-12-01: qty 50

## 2016-12-01 MED ORDER — MORPHINE SULFATE (PF) 2 MG/ML IV SOLN
2.0000 mg | INTRAVENOUS | Status: DC | PRN
  Administered 2016-12-01: 2 mg via INTRAVENOUS
  Filled 2016-12-01: qty 1

## 2016-12-01 MED ORDER — SODIUM CHLORIDE 0.9% FLUSH
3.0000 mL | Freq: Two times a day (BID) | INTRAVENOUS | Status: DC
  Administered 2016-12-01 (×2): 3 mL via INTRAVENOUS

## 2016-12-01 MED ORDER — HYDRALAZINE HCL 20 MG/ML IJ SOLN: 5.0000 mg | INTRAMUSCULAR | Status: DC | PRN

## 2016-12-01 MED ORDER — HEPARIN (PORCINE) IN NACL 2-0.9 UNIT/ML-% IJ SOLN
INTRAMUSCULAR | Status: DC | PRN
  Administered 2016-12-01: 1000 mL

## 2016-12-01 MED ORDER — BIVALIRUDIN BOLUS VIA INFUSION - CUPID
INTRAVENOUS | Status: DC | PRN
Start: 2016-12-01 — End: 2016-12-01
  Administered 2016-12-01: 45.825 mg via INTRAVENOUS

## 2016-12-01 MED ORDER — SODIUM CHLORIDE 0.9% FLUSH: 3.0000 mL | INTRAVENOUS | Status: DC | PRN

## 2016-12-01 MED ORDER — TICAGRELOR 90 MG PO TABS
90.0000 mg | ORAL_TABLET | Freq: Two times a day (BID) | ORAL | Status: DC
  Administered 2016-12-01 – 2016-12-02 (×2): 90 mg via ORAL
  Filled 2016-12-01 (×2): qty 1

## 2016-12-01 MED ORDER — TICAGRELOR 90 MG PO TABS
ORAL_TABLET | ORAL | Status: DC | PRN
  Administered 2016-12-01: 180 mg via ORAL

## 2016-12-01 MED ORDER — SODIUM CHLORIDE 0.9 % IV SOLN
INTRAVENOUS | Status: DC | PRN
  Administered 2016-12-01: 1.75 mg/kg/h via INTRAVENOUS

## 2016-12-01 MED ORDER — HEART ATTACK BOUNCING BOOK
Freq: Once | Status: AC
  Administered 2016-12-01: 23:00:00
  Filled 2016-12-01: qty 1

## 2016-12-01 MED ORDER — LIDOCAINE HCL (PF) 1 % IJ SOLN
INTRAMUSCULAR | Status: DC | PRN
  Administered 2016-12-01: 25 mL

## 2016-12-01 MED ORDER — ACETAMINOPHEN 325 MG PO TABS: 650.0000 mg | ORAL_TABLET | ORAL | Status: DC | PRN

## 2016-12-01 SURGICAL SUPPLY — 20 items
BALLN EMERGE MR 2.5X12 (BALLOONS) ×2
BALLN MOZEC 2.0X12 (BALLOONS) ×2
BALLOON EMERGE MR 2.5X12 (BALLOONS) IMPLANT
BALLOON MOZEC 2.0X12 (BALLOONS) IMPLANT
CATH INFINITI 6F ANG MULTIPACK (CATHETERS) ×1 IMPLANT
CATH LAUNCHER 6FR AL1 (CATHETERS) IMPLANT
CATH LAUNCHER 6FR JR4 (CATHETERS) ×1 IMPLANT
CATHETER LAUNCHER 6FR AL1 (CATHETERS) ×2
CATHETER LAUNCHER 6FR LCB (CATHETERS) ×1 IMPLANT
DEVICE CONTINUOUS FLUSH (MISCELLANEOUS) ×1 IMPLANT
KIT ENCORE 26 ADVANTAGE (KITS) ×1 IMPLANT
KIT HEART LEFT (KITS) ×2 IMPLANT
PACK CARDIAC CATHETERIZATION (CUSTOM PROCEDURE TRAY) ×2 IMPLANT
SHEATH PINNACLE 6F 10CM (SHEATH) ×1 IMPLANT
STENT SYNERGY DES 2.75X16 (Permanent Stent) ×2 IMPLANT
SYR MEDRAD MARK V 150ML (SYRINGE) ×2 IMPLANT
TRANSDUCER W/STOPCOCK (MISCELLANEOUS) ×2 IMPLANT
TUBING CIL FLEX 10 FLL-RA (TUBING) ×2 IMPLANT
WIRE ASAHI PROWATER 180CM (WIRE) ×1 IMPLANT
WIRE EMERALD 3MM-J .035X150CM (WIRE) ×1 IMPLANT

## 2016-12-01 NOTE — Progress Notes (Signed)
NTG x 3 given with VSS but pain went from 9/20 to 2/10, NP with Cards called back and was updated, will place orders for Morphine and may place him on a NTG drip. Patient has been on a Heparin drip since admission, will continue to monitor.

## 2016-12-01 NOTE — Discharge Summary (Signed)
Discharge Summary    Patient ID: [name],  MRN: [id number], DOB/AGE: Mar 19, 1946 71 y.o.  Admit date: 11/29/2016 Discharge date: 12/02/16 Primary Care Provider: Dorothyann Peng Primary Cardiologist: Stanford Breed  Discharge Diagnoses    Principal Problem:   NSTEMI (non-ST elevated myocardial infarction) [facility]) Active Problems:   CAD- S/P PCI SVG-OM1 12/01/16   Human immunodeficiency virus (HIV) disease ([place])   DM (diabetes mellitus), type 2 with ophthalmic complications ([place])   Essential hypertension   GERD   Hx of CABG   HLD (hyperlipidemia)   Chronic renal insufficiency, stage III (moderate)   Allergies No Known Allergies  Diagnostic Studies/Procedures    LHC: 12/01/16  History obtained from chart review. [name] Is a pleasant 71 year old male with past medical history of hypertension, hyperlipidemia, CKD stage III, COPD, DM 2, HIV and CAD s/p CABG in 2003 presented back with NSTEMI after recent discharge. Has known 80-90% SVG to OM1-OM2 lesion was medically treated.    PROCEDURE DESCRIPTION:   The patient was brought to the second floor [facility] Cardiac cath lab in the postabsorptive state. He was  premedicated with Valium 5 mg by mouth, IV Versed and fentanyl. His right groin was prepped and shaved in usual sterile fashion. Xylocaine 1% was used for local anesthesia. A 6 French sheath was inserted into the right common femoral artery using standard Seldinger technique. A 5 French right and left Judkins diagnostic catheters were used for selective coronary angiography, vein graft and IMA graft angiography. Isovue dye was used for the entirety of the case. Retrograde aortic pressure was monitored during the case.  The patient received Angiomax bolus followed by infusion with demonstration of a therapeutic ACT. He received Brilenta 180 mg by mouth. I initially attempted to use a JR4 followed by a left bypass graft guide catheter was do not provide enough support and  therefore ultimately used a 6 [place] AL-1 guide catheter along with an 014 Pro Water guidewire and a 2 mm x 12 mm balloon. The cath revealed a thrombotic lesion in the proximal SVG to the ramus and OM branch sequentially as well as a subtotally occluded distal limb just beyond ramus insertion. I gave 200 g of intra-graft verapamil prior to intervention. I crossed the lesion with the guidewire and balloon of the tubular balloon up into 25 balloon resulting reduction of a 99% subtotal lesion to 0% residual with excellent flow. Following this I direct stented the proximal SVG with a 2.75 mm x 16 mm long synergy drug-eluting stent at 16 atm (3 mm) resulting reduction of a proximal 70% thrombotic appearing lesion to 0% residual. There was no evidence of distal embolization. The patient tolerated the procedure well without hemodynamic or sequela. The guidewire and catheter were removed. Sheath was secured. The patient will continue Angiomax of those for 4 hours.   IMPRESSION: Successful circumflex obtuse marginal branch sequential graft to the ramus and OM branches with stenting of a thrombotic ulcerated proximal stenosis and angioplasty of the continuing anastomosis at the ramus branch with excellent angiographic result. I suspect this was due to the "culprit lesion" responsible for the non-STEMI. The sheath will be removed and pressure held. A total of 125 mL of contrast was administered to the patient. He'll be hydrated overnight and potentially discharged home in the morning.  [name]. MD, [name] _____________   History of Present Illness     [name] Is a pleasant 71 year old male with past medical history of hypertension, hyperlipidemia,  CKD stage III, COPD, DM 2, HIV and CAD s/p CABG in 2003. He was admitted in December 2016 with acute infarct. Cardiac catheterization showed severe three-vessel disease, he ultimately underwent PCI to SVG to PDA and posterolateral. Most recently, he returned to  the hospital on 11/22/2016 with unstable angina. Echocardiogram obtained on 11/23/2016 showed EF 32-35%, grade 2 diastolic dysfunction, severely calcified leaflet in the aortic valve with immobile right coronary cusp. He underwent heart catheterization on 11/24/2016 which showed severe native CAD with 80% ostial LAD, CT of proximal left circumflex and proximal RCA. Widely patent LIMA to LAD. Patent SVG to diagonal was stable 80-90% stenosis in the diagonal just distal to the SVG anastomosis, patent SVG to OM1 and OM 2 with 80-90% stenosis in the jump graft immediately distal to the OM1 and the stenosis this is new since 2016. Ostially occluded SVG to PDA with left to left collaterals. Aggressive medical therapy was recommended, however if patient continued to have chest pain, could consider PCI to SVG to OM1 and OM 2, although location of the disease places high OM branch in jeopardy. Serial troponin was normal during the last admission. He was discharged on 11/25/2016, he continued to have intermittent chest discomfort in the last 2 days however not associated with much exertion.  On the morning of 11/29/16 he was sitting in the car ready to go to go eat at his local church around 8 AM when he had burning sensation radiating up. It lasted a total of 10 minutes before resolving and has not recurred again. He said this was similar to his was reflux but however much stronger. Initial point-of-care troponin was 1.96, however troponin I came back 1.88 as well. Note, his troponin was normal during the recent hospital visit when he had cardiac catheterization. Cardiology was consulted for NSTEMI. Other laboratory finding include creatinine of 1.54.  Hospital Course     He was admitted with plans for cardiac cath with attempt at PCI to SVG-OM1/OM2. Placed on heparin drip, continued on ASA, with Lipitor increased to 10m daily. Troponin peaked at 2.8. Underwent LHC on 12/01/16 with Dr. BGwenlyn Foundnoted above, with successful  DES placed to SVG-OM1/OM2. He was hydrated overnight post cath. He was placed on DAPT with asa and Brilinta.    Labs the following morning showed Cr 1.3, Hgb 11.1. He reported no complications overnight. He was seen and determined stable for discharge by Dr. MJulianne Handler Follow up was arranged in the office. Medications are listed below. _____________  Discharge Vitals Blood pressure 113/66, pulse 70, temperature 98.2 F (36.8 C), temperature source Oral, resp. rate 14, height 5' 3" (1.6 m), weight 134 lb 4.2 oz (60.9 kg), SpO2 96 %.  Filed Weights   11/30/16 0540 12/01/16 0416 12/02/16 0650  Weight: 129 lb 9.6 oz (58.8 kg) 134 lb 12.8 oz (61.1 kg) 134 lb 4.2 oz (60.9 kg)    Labs & Radiologic Studies    CBC  Recent Labs  12/01/16 0310 12/02/16 0306  WBC 4.4 6.6  HGB 10.5* 11.1*  HCT 30.8* 33.8*  MCV 94.8 94.9  PLT 149* 1573  Basic Metabolic Panel  Recent Labs  12/01/16 0624 12/02/16 0306  NA 140 140  K 4.3 4.9  CL 110 112*  CO2 21* 22  GLUCOSE 203* 109*  BUN 29* 22*  CREATININE 1.59* 1.34*  CALCIUM 8.6* 8.7*   Liver Function Tests No results for input(s): AST, ALT, ALKPHOS, BILITOT, PROT, ALBUMIN in the last 72 hours. No results  for input(s): LIPASE, AMYLASE in the last 72 hours. Cardiac Enzymes  Recent Labs  11/29/16 1648 11/29/16 2335 11/30/16 0426  TROPONINI 2.30* 2.80* 2.65*   _____________  Dg Chest 2 View  Result Date: 11/29/2016 CLINICAL DATA:  Chest pain and hypertension EXAM: CHEST  2 VIEW COMPARISON:  November 22, 2016 FINDINGS: There is slight scarring in the left base. Lungs elsewhere are clear. Heart size and pulmonary vascularity are normal. No adenopathy. Patient is status post coronary artery bypass grafting. No adenopathy. No bone lesions. IMPRESSION: Slight scarring left base.  No edema or consolidation. Electronically Signed   By: Lowella Grip III M.D.   On: 11/29/2016 10:08   Dg Chest 2 View  Result Date: 11/22/2016 CLINICAL DATA:   Chest pain. EXAM: CHEST  2 VIEW COMPARISON:  05/06/2016 FINDINGS: Sequelae of prior CABG are again identified. The cardiac silhouette is upper limits of normal in size. Minimal streaky left basilar opacity likely represents atelectasis or scarring. The lungs are otherwise clear. No pleural effusion or pneumothorax is identified. No acute osseous abnormality is seen. IMPRESSION: Minimal left basilar opacity, likely scarring or atelectasis. Electronically Signed   By: Logan Bores M.D.   On: 11/22/2016 16:09   Disposition   Pt is being discharged home today in good condition.  Follow-up Plans & Appointments    Follow-up [place], [place] Follow up on 12/15/2016.   Specialties:  Physician Assistant, Cardiology Why:  9:30 Contact information: [address] [place] [address] [phone]          Discharge Instructions    Amb Referral to Cardiac Rehabilitation    Complete by:  As directed    Diagnosis:   Coronary Stents PTCA NSTEMI        Discharge Medications   Current Discharge Medication List    START taking these medications   Details  acetaminophen (TYLENOL) 325 MG tablet Take 2 tablets (650 mg total) by mouth every 4 (four) hours as needed for headache or mild pain.    pantoprazole (PROTONIX) 40 MG tablet Take 1 tablet (40 mg total) by mouth daily. Qty: 30 tablet, Refills: 5    ticagrelor (BRILINTA) 90 MG TABS tablet Take 1 tablet (90 mg total) by mouth 2 (two) times daily. Qty: 180 tablet, Refills: 3      CONTINUE these medications which have CHANGED   Details  atorvastatin (LIPITOR) 80 MG tablet Take 1 tablet (80 mg total) by mouth daily at 6 PM. Qty: 90 tablet, Refills: 3    lisinopril (PRINIVIL,ZESTRIL) 2.5 MG tablet Take 1 tablet (2.5 mg total) by mouth daily.    metFORMIN (GLUCOPHAGE-XR) 500 MG 24 hr tablet Take 2 tablets (1,000 mg total) by mouth daily with supper. Qty: 60 tablet, Refills: 3      CONTINUE these  medications which have NOT CHANGED   Details  amLODipine (NORVASC) 10 MG tablet Take 0.5 tablets (5 mg total) by mouth at bedtime. Qty: 30 tablet, Refills: 5    aspirin 81 MG chewable tablet Chew 81 mg by mouth daily.    b complex vitamins tablet Take 1 tablet by mouth daily. Qty: 30 tablet, Refills: 11    Cholecalciferol 2000 units CAPS Take 1 capsule (2,000 Units total) by mouth daily. Qty: 30 each, Refills: 11    diclofenac (VOLTAREN) 50 MG EC tablet TAKE 1 TABLET(50 MG) BY MOUTH TWICE DAILY Qty: 180 tablet, Refills: 0    furosemide (LASIX) 20 MG tablet TAKE  2 TABLETS BY MOUTH DAILY AS NEEDED FOR FLUID RETENTION OR SWELLING Qty: 60 tablet, Refills: 0    glucose blood (FREESTYLE TEST STRIPS) test strip Use three times daily to check blood sugar.  DX E11.9 Qty: 100 each, Refills: 6    isosorbide mononitrate (IMDUR) 30 MG 24 hr tablet Take 1 tablet (30 mg total) by mouth daily. Qty: 30 tablet, Refills: 5    Krill Oil 300 MG CAPS Take 1 capsule (300 mg total) by mouth daily. Qty: 30 capsule, Refills: 11    Lancets (FREESTYLE) lancets Use as directed three times a day to check blood sugar.  DX E11.9 Qty: 100 each, Refills: 11    metoprolol tartrate (LOPRESSOR) 25 MG tablet Take 0.5 tablets (12.5 mg total) by mouth 2 (two) times daily. Qty: 60 tablet, Refills: 5    neomycin-polymyxin-hydrocortisone (CORTISPORIN) otic solution INSTILL 3 DROPS IN BOTH EARS THREE TIMES DAILY Qty: 10 mL, Refills: 0    nitroGLYCERIN (NITROSTAT) 0.4 MG SL tablet Place 1 tablet (0.4 mg total) under the tongue every 5 (five) minutes as needed for chest pain. Qty: 25 tablet, Refills: 3    NORVIR 100 MG TABS tablet TAKE 1 TABLET(100 MG) BY MOUTH DAILY Qty: 30 tablet, Refills: 11   Associated Diagnoses: Human immunodeficiency virus disease (HCC)    omega-3 acid ethyl esters (LOVAZA) 1 g capsule Take 1 capsule (1 g total) by mouth 2 (two) times daily. Qty: 120 capsule, Refills: 2    pioglitazone  (ACTOS) 15 MG tablet TAKE 1 TABLET(15 MG) BY MOUTH DAILY Qty: 30 tablet, Refills: 0    potassium chloride SA (K-DUR,KLOR-CON) 20 MEQ tablet TAKE 1 TABLET(20 MEQ) BY MOUTH THREE TIMES DAILY Qty: 180 tablet, Refills: 3    PREZISTA 800 MG tablet TAKE 1 TABLET(800 MG) BY MOUTH DAILY Qty: 30 tablet, Refills: 11   Associated Diagnoses: Human immunodeficiency virus disease (HCC)    Probiotic Product (PROBIOTIC DAILY) CAPS Take 1 by mouth daily Qty: 30 capsule, Refills: 11    repaglinide (PRANDIN) 2 MG tablet TAKE 2 TABLETS BY MOUTH BEFORE BREAKFAST, 1 TABLET BY MOUTH BEFORE LUNCH AND 2 TABLETS BY MOUTH BEFORE SUPPER Qty: 450 tablet, Refills: 0    rilpivirine (EDURANT) 25 MG TABS tablet Take 1 tablet (25 mg total) by mouth daily with breakfast. Qty: 30 tablet, Refills: 5   Associated Diagnoses: Human immunodeficiency virus I infection (HCC)    sodium bicarbonate 650 MG tablet Take 650 mg by mouth 3 (three) times daily.    TIVICAY 50 MG tablet TAKE 1 TABLET BY MOUTH DAILY Qty: 30 tablet, Refills: 5   Associated Diagnoses: Human immunodeficiency virus (HIV) disease (HCC)    triamcinolone cream (KENALOG) 0.1 % APP TO DRY ITCHY SKIN BID PRN Refills: 2    valACYclovir (VALTREX) 500 MG tablet TAKE 1 TABLET BY MOUTH DAILY Qty: 30 tablet, Refills: 0   Associated Diagnoses: Herpes zoster without complication      STOP taking these medications     Insulin Pen Needle (B-D UF III MINI PEN NEEDLES) 31G X 5 MM MISC      metolazone (ZAROXOLYN) 2.5 MG tablet      Insulin Glargine (TOUJEO SOLOSTAR) 300 UNIT/ML SOPN          Aspirin prescribed at discharge?  Yes High Intensity Statin Prescribed? (Lipitor 40-49m or Crestor 20-442m: Yes Beta Blocker Prescribed? Yes For EF <40%, was ACEI/ARB Prescribed? Yes ADP Receptor Inhibitor Prescribed? (i.e. Plavix etc.-Includes Medically Managed Patients): Yes For EF <40%, Aldosterone Inhibitor  Prescribed? No: NA Was EF assessed during THIS  hospitalization? Yes Was Cardiac Rehab II ordered? (Included Medically managed Patients): Yes   Outstanding Labs/Studies     Duration of Discharge Encounter   Greater than 30 minutes including physician time.  Signed, Kerin Ransom PA-C 12/02/2016, 8:48 AM

## 2016-12-01 NOTE — Progress Notes (Signed)
Pt still not feeling but states that he felt sick to stomach with burning pain from mid chest to his throat, will give Zofran 4 mg IVP and continue to monitor, VSS at this time.

## 2016-12-01 NOTE — Progress Notes (Signed)
Consent signed and patient ready for heart cath, left groin and right wrist prep completed, patient had 2 EKG's this am and MD made aware, chart ready to go with patient and cath lab called to get him ready to go down now, will give his 2 MG IV Morphine for pain now coming back 7/10 same type as previous ( burning midsternal up into throat), will go with tech down to cath lab with patient on monitor.

## 2016-12-01 NOTE — Interval H&P Note (Signed)
Cath Lab Visit (complete for each Cath Lab visit)  Clinical Evaluation Leading to the Procedure:   ACS: Yes.    Non-ACS:    Anginal Classification: CCS III  Anti-ischemic medical therapy: Maximal Therapy (2 or more classes of medications)  Non-Invasive Test Results: No non-invasive testing performed  Prior CABG: Previous CABG      History and Physical Interval Note:  12/01/2016 7:39 AM  [name]  has presented today for surgery, with the diagnosis of NSTEMI  The various methods of treatment have been discussed with the patient and family. After consideration of risks, benefits and other options for treatment, the patient has consented to  Procedure(s): Left Heart Cath and Cors/Grafts Angiography (N/A) as a surgical intervention .  The patient's history has been reviewed, patient examined, no change in status, stable for surgery.  I have reviewed the patient's chart and labs.  Questions were answered to the patient's satisfaction.     Quay Burow

## 2016-12-01 NOTE — Care Management Note (Signed)
Case Management Note  Patient Details  Name: LEITH SZAFRANSKI MRN: [id number] Date of Birth: [birth year]  Subjective/Objective:   From home alone, pta indep,  s/p coronary stent intervention, will be on brilinta, NCM gave patient the 30 day savings card , his co pay amount, He states he will go to [facility] on [address] to get his 30 day free, they do have 15 pills in stock and will order the rest for him.                  Action/Plan: NCM will follow for dc needs.   Expected Discharge Date:                  Expected Discharge Plan:  Home/Self Care  In-House Referral:     Discharge planning Services  CM Consult  Post Acute Care Choice:    Choice offered to:     DME Arranged:    DME Agency:     HH Arranged:    [place] Agency:     Status of Service:  Completed, signed off  If discussed at [employer] of Stay Meetings, dates discussed:    Additional Comments:  Zenon Mayo, RN 12/01/2016, 12:18 PM

## 2016-12-01 NOTE — Progress Notes (Signed)
Brilinta 90mg  BID is covered. Patient has a copay of $8.35 . No prior Josem Kaufmann is needed. He may use his pharmacy which is [facility]

## 2016-12-01 NOTE — H&P (View-Only) (Signed)
Risk and benefit of cardiac catheterization explained to the patient who display clear understanding and agree to proceed.  Discussed with patient possible procedural risk include bleeding, vascular injury, renal injury, arrythmia, MI, stroke and loss of limb or life.  Hilbert Corrigan PA Pager: [phone]

## 2016-12-01 NOTE — Progress Notes (Signed)
Patient taken down to cath lab with chart in stable condition, pain now 5/10, report given to cath lab nurse and monitor returned to room. Will give Carly his day RN report and let her know where his belongings are In case he doesn't return.

## 2016-12-01 NOTE — Progress Notes (Signed)
NTG given S/L for chest discomfort not relieved with any other medications, VSS, will place on O2 at 2L N/C and get an EKG, Cards called to inform of patient's condition since he's due to have a heart cath this am but not on schedule yet, awaiting call back, will continue to monitor.

## 2016-12-01 NOTE — Progress Notes (Signed)
Patient called out C/O indigestion, will give him some Maalox which worked the night before and continue to monitor. Patient was dry heaving when I went into give his medication, asked if he wanted Zofran instead but he wanted the Maalox at this time, will continue to monitor. Jennifer(cathRN) on unit and made aware of situation, will continue to monitor his pain.

## 2016-12-01 NOTE — Progress Notes (Signed)
Site area: right groin  Site Prior to Removal:  Level 0  Pressure Applied For 20 MINUTES    Minutes Beginning at 1805  Manual:   Yes.    Patient Status During Pull:  stable  Post Pull Groin Site:  Level 0  Post Pull Instructions Given:  Yes.    Post Pull Pulses Present:  Yes.    Dressing Applied:  Yes.    Comments:  Site rechecked at 1900 with no change, dressing dry and intact,  csms wnls

## 2016-12-02 DIAGNOSIS — N183 Chronic kidney disease, stage 3 unspecified: Secondary | ICD-10-CM

## 2016-12-02 DIAGNOSIS — N1832 Chronic kidney disease, stage 3b: Secondary | ICD-10-CM | POA: Diagnosis present

## 2016-12-02 DIAGNOSIS — N179 Acute kidney failure, unspecified: Secondary | ICD-10-CM | POA: Diagnosis present

## 2016-12-02 HISTORY — DX: Chronic kidney disease, stage 3 unspecified: N18.30

## 2016-12-02 LAB — CBC
HEMATOCRIT: 33.8 % — AB (ref 39.0–52.0)
Hemoglobin: 11.1 g/dL — ABNORMAL LOW (ref 13.0–17.0)
MCH: 31.2 pg (ref 26.0–34.0)
MCHC: 32.8 g/dL (ref 30.0–36.0)
MCV: 94.9 fL (ref 78.0–100.0)
Platelets: 187 10*3/uL (ref 150–400)
RBC: 3.56 MIL/uL — ABNORMAL LOW (ref 4.22–5.81)
RDW: 15.1 % (ref 11.5–15.5)
WBC: 6.6 10*3/uL (ref 4.0–10.5)

## 2016-12-02 LAB — GLUCOSE, CAPILLARY: Glucose-Capillary: 164 mg/dL — ABNORMAL HIGH (ref 65–99)

## 2016-12-02 LAB — BASIC METABOLIC PANEL
Anion gap: 6 (ref 5–15)
BUN: 22 mg/dL — ABNORMAL HIGH (ref 6–20)
CALCIUM: 8.7 mg/dL — AB (ref 8.9–10.3)
CHLORIDE: 112 mmol/L — AB (ref 101–111)
CO2: 22 mmol/L (ref 22–32)
CREATININE: 1.34 mg/dL — AB (ref 0.61–1.24)
GFR calc Af Amer: >60 mL/min (ref >60)
GFR calc non Af Amer: 53 mL/min — ABNORMAL LOW (ref >60)
GLUCOSE: 109 mg/dL — AB (ref 65–99)
Potassium: 4.9 mmol/L (ref 3.5–5.1)
Sodium: 140 mmol/L (ref 135–145)

## 2016-12-02 MED ORDER — TICAGRELOR 90 MG PO TABS: 90.0000 mg | ORAL_TABLET | Freq: Two times a day (BID) | ORAL | 3 refills | Status: DC

## 2016-12-02 MED ORDER — LISINOPRIL 2.5 MG PO TABS: 2.5000 mg | ORAL_TABLET | Freq: Every day | ORAL | Status: DC

## 2016-12-02 MED ORDER — ATORVASTATIN CALCIUM 80 MG PO TABS: 80.0000 mg | ORAL_TABLET | Freq: Every day | ORAL | 3 refills | Status: DC

## 2016-12-02 MED ORDER — METFORMIN HCL ER 500 MG PO TB24: 1000.0000 mg | ORAL_TABLET | Freq: Every day | ORAL | 3 refills | Status: DC

## 2016-12-02 MED ORDER — PANTOPRAZOLE SODIUM 40 MG PO TBEC: 40.0000 mg | DELAYED_RELEASE_TABLET | Freq: Every day | ORAL | 5 refills | Status: DC

## 2016-12-02 MED ORDER — PANTOPRAZOLE SODIUM 40 MG PO TBEC
40.0000 mg | DELAYED_RELEASE_TABLET | Freq: Every day | ORAL | Status: DC
  Administered 2016-12-02: 09:00:00 40 mg via ORAL
  Filled 2016-12-02: qty 1

## 2016-12-02 MED ORDER — ACETAMINOPHEN 325 MG PO TABS: 650.0000 mg | ORAL_TABLET | ORAL | Status: AC | PRN

## 2016-12-02 NOTE — Progress Notes (Signed)
CARDIAC REHAB PHASE I   PRE:  Rate/Rhythm: 68 SR    BP: sitting 115/66    SaO2:   MODE:  Ambulation: 1000 ft   POST:  Rate/Rhythm: 92 SR    BP: sitting 121/71     SaO2:   Tolerated well, no c/o. Feels much better. Ed completed/reviewed. Voiced understanding. Understands Brilinta importance. Will refer to [place], ACSM 12/02/2016 8:37 AM

## 2016-12-02 NOTE — Discharge Instructions (Signed)
Heart Attack °A heart attack (myocardial infarction, MI) causes damage to the heart that cannot be fixed. A heart attack often happens when a blood clot or other blockage cuts blood flow to the heart. When this happens, certain areas of the heart begin to die. This causes the pain you feel during a heart attack. °Follow these instructions at home: °· Take medicine as told by your doctor. You may need medicine to: °¨ Keep your blood from clotting too easily. °¨ Control your blood pressure. °¨ Lower your cholesterol. °¨ Control abnormal heart rhythms. °· Change certain behaviors as told by your doctor. This may include: °¨ Quitting smoking. °¨ Being active. °¨ Eating a heart-healthy diet. Ask your doctor for help with this diet. °¨ Keeping a healthy weight. °¨ Keeping your diabetes under control. °¨ Lessening stress. °¨ Limiting how much alcohol you drink. °Do not take these medicines unless your doctor says that you can: °· Nonsteroidal anti-inflammatory drugs (NSAIDs). These include: °¨ Ibuprofen. °¨ Naproxen. °¨ Celecoxib. °· Vitamin supplements that have vitamin A, vitamin E, or both. °· Hormone therapy that contains estrogen with or without progestin. °Get help right away if: °· You have sudden chest discomfort. °· You have sudden discomfort in your: °¨ Arms. °¨ Back. °¨ Neck. °¨ Jaw. °· You have shortness of breath at any time. °· You have sudden sweating or clammy skin. °· You feel sick to your stomach (nauseous) or throw up (vomit). °· You suddenly get light-headed or dizzy. °· You feel your heart beating fast or skipping beats. °These symptoms may be an emergency. Do not wait to see if the symptoms will go away. Get medical help right away. Call your local emergency services (911 in the [place] Do not drive yourself to the hospital.  °This information is not intended to replace advice given to you by your health care provider. Make sure you discuss any questions you have with your health care  provider. °Document Released: 01/20/2012 Document Revised: 12/27/2015 Document Reviewed: 09/23/2013 °[employer] Interactive Patient Education © 2017 [employer] °Coronary Angiogram With Stent, Care After °This sheet gives you information about how to care for yourself after your procedure. Your health care provider may also give you more specific instructions. If you have problems or questions, contact your health care provider. °What can I expect after the procedure? °After your procedure, it is common to have: °· Bruising in the area where a small, thin tube (catheter) was inserted. This usually fades within 1-2 weeks. °· Blood collecting in the tissue (hematoma) that may be painful to the touch. It should usually decrease in size and tenderness within 1-2 weeks. °Follow these instructions at home: °Insertion area care °· Do not take baths, swim, or use a hot tub until your health care provider approves. °· You may shower 24-48 hours after the procedure or as directed by your health care provider. °· Follow instructions from your health care provider about how to take care of your incision. Make sure you: °¨ Wash your hands with soap and water before you change your bandage (dressing). If soap and water are not available, use hand sanitizer. °¨ Change your dressing as told by your health care provider. °¨ Leave stitches (sutures), skin glue, or adhesive strips in place. These skin closures may need to stay in place for 2 weeks or longer. If adhesive strip edges start to loosen and curl up, you may trim the loose edges. Do not remove adhesive strips completely unless your health care   provider tells you to do that. °· Remove the bandage (dressing) and gently wash the catheter insertion site with plain soap and water. °· Pat the area dry with a clean towel. Do not rub the area, because that may cause bleeding. °· Do not apply powder or lotion to the incision area. °· Check your incision area every day for signs of  infection. Check for: °¨ More redness, swelling, or pain. °¨ More fluid or blood. °¨ Warmth. °¨ Pus or a bad smell. °Activity °· Do not drive for 24 hours if you were given a medicine to help you relax (sedative). °· Do not lift anything that is heavier than 10 lb (4.5 kg) for 5 days after your procedure or as directed by your health care provider. °· Ask your health care provider when it is okay for you: °¨ To return to work or school. °¨ To resume usual physical activities or sports. °¨ To resume sexual activity. °Eating and drinking °· Eat a heart-healthy diet. This should include plenty of fresh fruits and vegetables. °· Avoid the following types of food: °¨ Food that is high in salt. °¨ Canned or highly processed food. °¨ Food that is high in saturated fat or sugar. °¨ Fried food. °· Limit alcohol intake to no more than 1 drink a day for non-pregnant women and 2 drinks a day for men. One drink equals 12 oz of beer, 5 oz of wine, or 1½ oz of hard liquor. °Lifestyle °· Do not use any products that contain nicotine or tobacco, such as cigarettes and e-cigarettes. If you need help quitting, ask your health care provider. °· Take steps to manage and control your weight. °· Get regular exercise. °· Manage your blood pressure. °· Manage other health problems, such as diabetes. °General instructions °· Take over-the-counter and prescription medicines only as told by your health care provider. Blood thinners may be prescribed after your procedure to improve blood flow through the stent. °· If you need an MRI after your heart stent has been placed, be sure to tell the health care provider who orders the MRI that you have a heart stent. °· Keep all follow-up visits as directed by your health care provider. This is important. °Contact a health care provider if: °· You have a fever. °· You have chills. °· You have increased bleeding from the catheter insertion area. Hold pressure on the area. °Get help right away if: °· You  develop chest pain or shortness of breath. °· You feel faint or you pass out. °· You have unusual pain at the catheter insertion area. °· You have redness, warmth, or swelling at the catheter insertion area. °· You have drainage (other than a small amount of blood on the dressing) from the catheter insertion area. °· The catheter insertion area is bleeding, and the bleeding does not stop after 30 minutes of holding steady pressure on the area. °· You develop bleeding from any other place, such as from your rectum. There may be bright red blood in your urine or stool, or it may appear as black, tarry stool. °This information is not intended to replace advice given to you by your health care provider. Make sure you discuss any questions you have with your health care provider. °Document Released: 02/07/2005 Document Revised: 04/17/2016 Document Reviewed: 04/17/2016 °Elsevier Interactive Patient Education © 2017 [employer] ° °

## 2016-12-02 NOTE — Progress Notes (Signed)
     SUBJECTIVE: No pain or dyspnea  Tele: sinus  BP 113/66   Pulse 70   Temp 98.3 F (36.8 C) (Oral)   Resp 14   Ht 5\' 3"  (1.6 m)   Wt 134 lb 4.2 oz (60.9 kg)   SpO2 95%   BMI 23.78 kg/m   Intake/Output Summary (Last 24 hours) at 12/02/16 0739 Last data filed at 12/02/16 1497  Gross per 24 hour  Intake          1109.25 ml  Output             3475 ml  Net         -2365.75 ml    PHYSICAL EXAM General: Well developed, well nourished, in no acute distress. Alert and oriented x 3.  Psych:  Good affect, responds appropriately Neck: No JVD. No masses noted.  Lungs: Clear bilaterally with no wheezes or rhonci noted.  Heart: RRR with no murmurs noted. Abdomen: Bowel sounds are present. Soft, non-tender.  Extremities: No lower extremity edema. No groin hematoma  LABS: Basic Metabolic Panel:  Recent Labs  12/01/16 0624 12/02/16 0306  NA 140 140  K 4.3 4.9  CL 110 112*  CO2 21* 22  GLUCOSE 203* 109*  BUN 29* 22*  CREATININE 1.59* 1.34*  CALCIUM 8.6* 8.7*   CBC:  Recent Labs  12/01/16 0310 12/02/16 0306  WBC 4.4 6.6  HGB 10.5* 11.1*  HCT 30.8* 33.8*  MCV 94.8 94.9  PLT 149* 187   Cardiac Enzymes:  Recent Labs  11/29/16 1648 11/29/16 2335 11/30/16 0426  TROPONINI 2.30* 2.80* 2.65*    Current Meds: . amLODipine  5 mg Oral QHS  . aspirin  81 mg Oral Daily  . atorvastatin  80 mg Oral q1800  . cholecalciferol  1,000 Units Oral Daily  . darunavir  800 mg Oral Q breakfast  . diclofenac  50 mg Oral BID WC  . dolutegravir  50 mg Oral Daily  . insulin aspart  0-9 Units Subcutaneous TID WC  . isosorbide mononitrate  30 mg Oral Daily  . lisinopril  2.5 mg Oral Daily  . metoprolol tartrate  12.5 mg Oral BID  . neomycin-polymyxin-hydrocortisone  3 drop Both Ears Q8H  . omega-3 acid ethyl esters  1 g Oral BID  . potassium chloride SA  20 mEq Oral TID  . rilpivirine  25 mg Oral Q breakfast  . ritonavir  100 mg Oral Q breakfast  . sodium bicarbonate  650  mg Oral TID  . sodium chloride flush  3 mL Intravenous Q12H  . ticagrelor  90 mg Oral BID  . valACYclovir  500 mg Oral Daily     ASSESSMENT AND PLAN:  1. CAD s/p CABG with NSTEMI: Pt admitted with NSTEMI felt to be secondary to several lesions in the SVG to the OM/Ramus.  Dr. Gwenlyn Found placed a Synergy DES in the proximal body of the vein graft yesterday and performed balloon angioplasty of the distal body of the vein graft. The patient is stable this am. He will need at least one year of DAPT with ASA and Brilinta. Will continue statin, Imdur, beta blocker. Will resume Protonix at pt request for GERD  Discharge home today. Follow up with Dr. Stanford Breed or office APP in one week.   Lauree Chandler  5/1/20187:39 AM

## 2016-12-03 ENCOUNTER — Other Ambulatory Visit: Payer: Self-pay | Admitting: Endocrinology

## 2016-12-05 ENCOUNTER — Other Ambulatory Visit: Payer: Self-pay | Admitting: Endocrinology

## 2016-12-08 ENCOUNTER — Ambulatory Visit: Payer: Medicare Other | Admitting: Student

## 2016-12-10 ENCOUNTER — Other Ambulatory Visit: Payer: Self-pay | Admitting: *Deleted

## 2016-12-10 ENCOUNTER — Encounter: Payer: Self-pay | Admitting: Cardiology

## 2016-12-10 DIAGNOSIS — E7800 Pure hypercholesterolemia, unspecified: Secondary | ICD-10-CM

## 2016-12-10 MED ORDER — ATORVASTATIN CALCIUM 20 MG PO TABS: 20.0000 mg | ORAL_TABLET | Freq: Every day | ORAL | 3 refills | Status: DC

## 2016-12-12 NOTE — Progress Notes (Signed)
Cardiology Office Note    Date:  12/15/2016   ID:  Christian, [name] 1946-03-06, MRN [id number]  PCP:  Dorothyann Peng, NP  Cardiologist: Dr. Stanford Breed   Chief Complaint  Patient presents with  . Hospitalization Follow-up    s/p stent placement    History of Present Illness:    [name] is a 71 y.o. male with past medical history of CAD (s/p CABG in 2003, PCI to SVG-PDA in 07/2015), HTN, HLD, Stage 3 CKD, Type 2 DM, mild AS and HIV who presents to the office today for hospital follow-up.   He was recently admitted from 4/21 - 11/25/2016 for evaluation of chest pain. Cardiac catheterization was performed on 4/23 and showed severe native CAD with 80% ostial LAD stenosis, CTO of the proximal LCx, and CTO proximal RCA (non-dominant). LIMA-LAD was patent with patent SVG->diagonal with stable 80-90% stenosis in diagonal just distal to SVG anastomosis and patent SVG->OM1 and OM2 with 80-90% stenosis in jump graft immediately distal to OM1 anastomosis, new since 2016. The SVG->PDA was occluded with left-to-left collaterals. Continued medical therapy was recommended and if chest pain persists the possibility of PCI to SVG-OM1->OM2 could be considered.  He presented back to the ED on 11/29/2016 for evaluation of recurrent centralized chest pain. Troponin values peaked at 2.80. He went for cardiac catheterization on 12/01/2016 with placement of a Syngery DES to the proximal body of the SVG-OM1-OM2 with angioplasty of the distal body of the graft. He was started on DAPT with ASA and Brilinta and will remain on this for at least 12 months. Was continued on statin, Imdur, and BB therapy.   In talking with the patient today, he reports doing well since his recent hospitalization. She denies any recurrent chest pain or dyspnea on exertion. Reports he had diarrhea for several days following his recent hospitalization but this has resolved. No associated melena or hematochezia.  Blood pressure is soft today  at 84/48. He does not check his blood pressure regularly at home. He denies any associated lightheadedness, dizziness, or presyncope.  Past Medical History:  Diagnosis Date  . Arthritis   . CAD (coronary artery disease)    a. s/p CABG in 2003 b. s/p PCI to SVG-PDA in 07/2015 c. 11/2016: cath showing severe native CAD with patent LIMA-LAD and SVG-D1 with 80% stenosis of SVG-OM1-OM2. Initially medical management was recommended --> presented with recurrent angina --> s/p Synergy DES to proximal body of SVG-OM1-OM2, POBA to distal graft.   . Chronic kidney disease   . Constipation 05/28/2014  . COPD (chronic obstructive pulmonary disease) ([place])   . Dermatitis 11/27/2012  . Diabetes mellitus   . Epicondylitis 06/05/2013   right  . GERD (gastroesophageal reflux disease)   . History of depression   . History of kidney stones   . HIV (human immunodeficiency virus infection) ([place]) 1991   on meds since initial dx.   . Hyperlipidemia   . Hypertension   . Nasal abscess 12/04/2015  . Pancreatitis 11/2013   attributed to HIV meds.     Past Surgical History:  Procedure Laterality Date  . CARDIAC CATHETERIZATION N/A 07/29/2015   Procedure: Left Heart Cath and Coronary Angiography;  Surgeon: [name], MD;  Location: [facility];  Service: Cardiovascular;  Laterality: N/A;  . CARDIAC CATHETERIZATION N/A 07/29/2015   Procedure: Coronary Stent Intervention;  Surgeon: [name], MD;  Location: [facility];  Service: Cardiovascular;  Laterality: N/A;  .  CORONARY ARTERY BYPASS GRAFT  05/2002  . CORONARY STENT INTERVENTION N/A 12/01/2016   Procedure: Coronary Stent Intervention;  Surgeon: [name], MD;  Location: [facility];  Service: Cardiovascular;  Laterality: N/A;  . LEFT HEART CATH AND CORS/GRAFTS ANGIOGRAPHY N/A 11/24/2016   Procedure: Left Heart Cath and Cors/Grafts Angiography;  Surgeon: Nelva Bush, MD;  Location: [facility];  Service:  Cardiovascular;  Laterality: N/A;  . LEFT HEART CATH AND CORS/GRAFTS ANGIOGRAPHY N/A 12/01/2016   Procedure: Left Heart Cath and Cors/Grafts Angiography;  Surgeon: [name], MD;  Location: [facility];  Service: Cardiovascular;  Laterality: N/A;  . VASECTOMY      Current Medications: Outpatient Medications Prior to Visit  Medication Sig Dispense Refill  . acetaminophen (TYLENOL) 325 MG tablet Take 2 tablets (650 mg total) by mouth every 4 (four) hours as needed for headache or mild pain.    Marland Kitchen aspirin 81 MG chewable tablet Chew 81 mg by mouth daily.    Marland Kitchen b complex vitamins tablet Take 1 tablet by mouth daily. 30 tablet 11  . B-D UF III MINI PEN NEEDLES 31G X 5 MM MISC USE TWICE DAILY AS DIRECTED 100 each 0  . Cholecalciferol 2000 units CAPS Take 1 capsule (2,000 Units total) by mouth daily. 30 each 11  . diclofenac (VOLTAREN) 50 MG EC tablet TAKE 1 TABLET(50 MG) BY MOUTH TWICE DAILY 180 tablet 0  . furosemide (LASIX) 20 MG tablet TAKE 2 TABLETS BY MOUTH DAILY AS NEEDED FOR FLUID RETENTION OR SWELLING 60 tablet 0  . glucose blood (FREESTYLE TEST STRIPS) test strip Use three times daily to check blood sugar.  DX E11.9 100 each 6  . isosorbide mononitrate (IMDUR) 30 MG 24 hr tablet Take 1 tablet (30 mg total) by mouth daily. 30 tablet 5  . Krill Oil 300 MG CAPS Take 1 capsule (300 mg total) by mouth daily. 30 capsule 11  . Lancets (FREESTYLE) lancets Use as directed three times a day to check blood sugar.  DX E11.9 100 each 11  . lisinopril (PRINIVIL,ZESTRIL) 2.5 MG tablet Take 1 tablet (2.5 mg total) by mouth daily.    . metFORMIN (GLUCOPHAGE-XR) 500 MG 24 hr tablet Take 2 tablets (1,000 mg total) by mouth daily with supper. 60 tablet 3  . metoprolol tartrate (LOPRESSOR) 25 MG tablet Take 0.5 tablets (12.5 mg total) by mouth 2 (two) times daily. 60 tablet 5  . neomycin-polymyxin-hydrocortisone (CORTISPORIN) otic solution INSTILL 3 DROPS IN BOTH EARS THREE TIMES DAILY 10 mL 0  .  nitroGLYCERIN (NITROSTAT) 0.4 MG SL tablet Place 1 tablet (0.4 mg total) under the tongue every 5 (five) minutes as needed for chest pain. 25 tablet 3  . NORVIR 100 MG TABS tablet TAKE 1 TABLET(100 MG) BY MOUTH DAILY 30 tablet 11  . omega-3 acid ethyl esters (LOVAZA) 1 g capsule Take 1 capsule (1 g total) by mouth 2 (two) times daily. 120 capsule 2  . pantoprazole (PROTONIX) 40 MG tablet Take 1 tablet (40 mg total) by mouth daily. 30 tablet 5  . pioglitazone (ACTOS) 15 MG tablet TAKE 1 TABLET(15 MG) BY MOUTH DAILY 30 tablet 0  . potassium chloride SA (K-DUR,KLOR-CON) 20 MEQ tablet TAKE 1 TABLET(20 MEQ) BY MOUTH THREE TIMES DAILY 180 tablet 3  . PREZISTA 800 MG tablet TAKE 1 TABLET(800 MG) BY MOUTH DAILY 30 tablet 11  . Probiotic Product (PROBIOTIC DAILY) CAPS Take 1 by mouth daily 30 capsule 11  .  repaglinide (PRANDIN) 2 MG tablet TAKE 2 TABLETS BY MOUTH BEFORE BREAKFAST, 1 TABLET BY MOUTH BEFORE LUNCH AND 2 TABLETS BY MOUTH BEFORE SUPPER 450 tablet 0  . rilpivirine (EDURANT) 25 MG TABS tablet Take 1 tablet (25 mg total) by mouth daily with breakfast. 30 tablet 5  . sodium bicarbonate 650 MG tablet Take 650 mg by mouth 3 (three) times daily.    . ticagrelor (BRILINTA) 90 MG TABS tablet Take 1 tablet (90 mg total) by mouth 2 (two) times daily. 180 tablet 3  . TIVICAY 50 MG tablet TAKE 1 TABLET BY MOUTH DAILY 30 tablet 5  . triamcinolone cream (KENALOG) 0.1 % APP TO DRY ITCHY SKIN BID PRN  2  . valACYclovir (VALTREX) 500 MG tablet TAKE 1 TABLET BY MOUTH DAILY 30 tablet 0  . amLODipine (NORVASC) 10 MG tablet Take 0.5 tablets (5 mg total) by mouth at bedtime. 30 tablet 5  . atorvastatin (LIPITOR) 20 MG tablet Take 1 tablet (20 mg total) by mouth daily at 6 PM. 90 tablet 3   No facility-administered medications prior to visit.      Allergies:   Patient has no known allergies.   Social History   Social History  . Marital status: Divorced    Spouse name: N/A  . Number of children: 4  . Years of  education: N/A   Occupational History  . 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    Social History Main Topics  . Smoking status: Never Smoker  . Smokeless tobacco: Never Used  . Alcohol use 0.0 oz/week     Comment: rare  . Drug use: No  . Sexual activity: Not Currently     Comment: declined condoms   Other Topics Concern  . None   Social History Narrative   Lives alone.  Supportive friends and family.  His HIV Dx is not a secret.      Family History:  The patient's family history includes Breast cancer in his maternal aunt; Diabetes in his mother; Hyperlipidemia in his father and mother; Hypertension in his father; Lung cancer in his maternal aunt; Mental retardation in his sister; Stroke in his paternal grandmother.   Review of Systems:   Please see the history of present illness.     General:  No chills, fever, night sweats or weight changes.  Cardiovascular:  No chest pain, dyspnea on exertion, edema, orthopnea, palpitations, paroxysmal nocturnal dyspnea. Dermatological: No rash, lesions/masses Respiratory: No cough, dyspnea Urologic: No hematuria, dysuria Abdominal:   No nausea, vomiting, bright red blood per rectum, melena, or hematemesis. Positive for diarrhea.  Neurologic:  No visual changes, wkns, changes in mental status. All other systems reviewed and are otherwise negative except as noted above.   Physical Exam:    VS:  BP (!) 82/50 (BP Location: Left Arm, Patient Position: Sitting, Cuff Size: Normal)   Pulse 68   Ht 5\' 3"  (1.6 m)   Wt 136 lb (61.7 kg)   BMI 24.09 kg/m    General: Well developed, well nourished [place] American male appearing in no acute distress. Head: Normocephalic, atraumatic, sclera non-icteric, no xanthomas, nares are without discharge.  Neck: No carotid bruits. JVD not elevated.  Lungs: Respirations regular and unlabored, without wheezes or rales.  Heart: Regular rate and rhythm. No S3 or S4.  No  murmur, no rubs, or gallops appreciated. Abdomen: Soft, non-tender, non-distended with normoactive bowel sounds. No hepatomegaly. No rebound/guarding. No obvious  abdominal masses. Msk:  Strength and tone appear normal for age. No joint deformities or effusions. Extremities: No clubbing or cyanosis. No lower extremity edema.  Distal pedal pulses are 2+ bilaterally. Neuro: Alert and oriented X 3. Moves all extremities spontaneously. No focal deficits noted. Psych:  Responds to questions appropriately with a normal affect. Skin: No rashes or lesions noted  Wt Readings from Last 3 Encounters:  12/15/16 136 lb (61.7 kg)  12/02/16 134 lb 4.2 oz (60.9 kg)  11/23/16 132 lb (59.9 kg)     Studies/Labs Reviewed:   EKG:  EKG is ordered today.  The ekg ordered today demonstrates SR with 1st degree AV Block, HR 68, with mild LVH.   Recent Labs: 05/19/2016: ALT 17; TSH 2.36 12/02/2016: BUN 22; Creatinine, Ser 1.34; Hemoglobin 11.1; Platelets 187; Potassium 4.9; Sodium 140   Lipid Panel    Component Value Date/Time   CHOL 149 11/23/2016 0052   TRIG 240 (H) 11/23/2016 0052   HDL 28 (L) 11/23/2016 0052   CHOLHDL 5.3 11/23/2016 0052   VLDL 48 (H) 11/23/2016 0052   LDLCALC 73 11/23/2016 0052   LDLDIRECT 72.0 11/04/2016 0909    Additional studies/ records that were reviewed today include:   Cardiac Catheterization: 11/24/2016 Findings: 1. Severe native CAD, including 80% ostial LAD, CTO proximal LCx, and CTO proximal RCA (non-dominant). 2. Widely patient LIMA-LAD. 3. Patent SVG->diagonal with stable 80-90% stenosis in diagonal just distal to SVG anastomosis. 4. Patent SVG->OM1 and OM2 with 80-90% stenosis in jump graft immediately distal to OM1 anastomosis, new since 2016. 5. Ostially occluded SVG->PDA with distal vessel filling via left-to-left collaterals. 6. Normal left ventricular filling pressure.  Recommendations: 1. Aggressive medical therapy; will add isosorbide mononitrate, to be  titrated up as tolerated. 2. If chest pain persists, could consider PCI to SVG-OM1->OM2, though location of disease would put ramus/high OM branch in jeopardy.   Cardiac Catheterization: 12/01/2016  Prox RCA to Mid RCA lesion, 100 %stenosed.  Ost Cx to Prox Cx lesion, 100 %stenosed.  Mid LAD lesion, 80 %stenosed.  Mid Graft lesion, 50 %stenosed.  2nd Diag lesion, 90 %stenosed.  Prox Graft lesion, 99 %stenosed.  Post intervention, there is a 0% residual stenosis.  Prox Graft lesion, 70 %stenosed.  Post intervention, there is a 0% residual stenosis.  A stent was successfully placed.  IMPRESSION: Successful circumflex obtuse marginal branch sequential graft to the ramus and OM branches with stenting of a thrombotic ulcerated proximal stenosis and angioplasty of the continuing anastomosis at the ramus branch with excellent angiographic result. I suspect this was due to the "culprit lesion" responsible for the non-STEMI. The sheath will be removed and pressure held. A total of 125 mL of contrast was administered to the patient. He'll be hydrated overnight and potentially discharged home in the morning.   Assessment:    1. Coronary artery disease involving native coronary artery of native heart with other form of angina pectoris ([place])   2. Essential hypertension   3. Pure hypercholesterolemia   4. CKD (chronic kidney disease) stage 3, GFR 30-59 ml/min   5. Type 2 DM with CKD and hypertension ([place])      Plan:   In order of problems listed above:  1. CAD - s/p CABG in 2003, PCI to SVG-PDA in 07/2015. Recently admitted in 11/2016 for UA with cath showing severe native CAD with 80% ostial LAD stenosis, CTO of the proximal LCx, and CTO proximal RCA (non-dominant). LIMA-LAD was patent with patent SVG->diagonal with stable  80-90% stenosis in diagonal just distal to SVG anastomosis and patent SVG->OM1 and OM2 with 80-90% stenosis in jump graft immediately distal to OM1 anastomosis, new  since 2016. Medical therapy was initially recommended but he presented back to the hospital with recurrent symptoms and was found to have an NSTEMI. Cath was performed on 12/01/2016 with placement of a Syngery DES to the proximal body of the SVG-OM1-OM2 with angioplasty of the distal body of the graft.  - he denies any recurrent anginal symptoms.  - continue DAPT with ASA and Brilinta along with statin, Imdur, and BB therapy. Will refer to cardiac rehab.   2. HTN - BP soft at 82/50, rechecked and 84/48. He denies any associated lightheadedness, dizziness, or presyncope.  - currently taking Amlodipine 5mg  daily, Imdur 30mg  daily, Lisinopril 2.5mg  daily, and Lopressor 12.5mg  BID. Will stop Amlodipine. I encouraged the patient to check his BP at home. If SBP remains less than 100, he is to call the office.   3. HLD - Lipid Panel in 11/2016 showed total cholesterol 149, HDL 28, and LDL 73. Goal LDL is < 70 with known CAD. - will increase Atorvastatin from 20mg  daily to 40mg  daily. Recheck FLP and LFT's in 6-8 weeks.   4. Stage 3 CKD - creatinine stable at 1.34 on last check following recent angiography. - has repeat labs with his PCP in 2 weeks.   5. Type 2 DM - followed by PCP.    Medication Adjustments/Labs and Tests Ordered: Current medicines are reviewed at length with the patient today.  Concerns regarding medicines are outlined above.  Medication changes, Labs and Tests ordered today are listed in the Patient Instructions below. Patient Instructions  Medication Instructions:  STOP- Amlodipine INCREASE- Atorvastatin 40 mg daily  Labwork: None Ordered  Testing/Procedures: None Ordered  Follow-Up: Your physician recommends that you schedule a follow-up appointment in: 2 Months with Dr Stanford Breed  Any Other Special Instructions Will Be Listed Below (If Applicable).  If you need a refill on your cardiac medications before your next appointment, please call your  pharmacy.  Signed, Erma Heritage, PA-C  12/15/2016 3:17 PM    [facility] [place], [place], [place]  [address] Phone: [phone]; Fax: [phone]  [address] [place],  [address] Phone: [phone]

## 2016-12-14 ENCOUNTER — Encounter: Payer: Self-pay | Admitting: Student

## 2016-12-15 ENCOUNTER — Encounter: Payer: Self-pay | Admitting: Student

## 2016-12-15 ENCOUNTER — Ambulatory Visit (INDEPENDENT_AMBULATORY_CARE_PROVIDER_SITE_OTHER): Payer: Medicare Other | Admitting: Student

## 2016-12-15 VITALS — BP 82/50 | HR 68 | Ht 63.0 in | Wt 136.0 lb

## 2016-12-15 DIAGNOSIS — N2581 Secondary hyperparathyroidism of renal origin: Secondary | ICD-10-CM | POA: Diagnosis not present

## 2016-12-15 DIAGNOSIS — E1122 Type 2 diabetes mellitus with diabetic chronic kidney disease: Secondary | ICD-10-CM | POA: Diagnosis not present

## 2016-12-15 DIAGNOSIS — IMO0001 Reserved for inherently not codable concepts without codable children: Secondary | ICD-10-CM

## 2016-12-15 DIAGNOSIS — Z9861 Coronary angioplasty status: Secondary | ICD-10-CM | POA: Diagnosis not present

## 2016-12-15 DIAGNOSIS — I129 Hypertensive chronic kidney disease with stage 1 through stage 4 chronic kidney disease, or unspecified chronic kidney disease: Secondary | ICD-10-CM | POA: Diagnosis not present

## 2016-12-15 DIAGNOSIS — E7800 Pure hypercholesterolemia, unspecified: Secondary | ICD-10-CM | POA: Diagnosis not present

## 2016-12-15 DIAGNOSIS — I25118 Atherosclerotic heart disease of native coronary artery with other forms of angina pectoris: Secondary | ICD-10-CM

## 2016-12-15 DIAGNOSIS — I10 Essential (primary) hypertension: Secondary | ICD-10-CM

## 2016-12-15 DIAGNOSIS — I209 Angina pectoris, unspecified: Secondary | ICD-10-CM | POA: Diagnosis not present

## 2016-12-15 DIAGNOSIS — N183 Chronic kidney disease, stage 3 unspecified: Secondary | ICD-10-CM

## 2016-12-15 DIAGNOSIS — N189 Chronic kidney disease, unspecified: Secondary | ICD-10-CM | POA: Diagnosis not present

## 2016-12-15 DIAGNOSIS — N2589 Other disorders resulting from impaired renal tubular function: Secondary | ICD-10-CM | POA: Diagnosis not present

## 2016-12-15 DIAGNOSIS — I2510 Atherosclerotic heart disease of native coronary artery without angina pectoris: Secondary | ICD-10-CM

## 2016-12-15 MED ORDER — ATORVASTATIN CALCIUM 40 MG PO TABS: 40.0000 mg | ORAL_TABLET | Freq: Every day | ORAL | 3 refills | Status: DC

## 2016-12-15 NOTE — Patient Instructions (Signed)
Medication Instructions:  STOP- Amlodipine INCREASE- Atorvastatin 40 mg daily  Labwork: None Ordered  Testing/Procedures: None Ordered  Follow-Up: Your physician recommends that you schedule a follow-up appointment in: 2 Months with Dr Stanford Breed   Any Other Special Instructions Will Be Listed Below (If Applicable).   If you need a refill on your cardiac medications before your next appointment, please call your pharmacy.

## 2016-12-16 ENCOUNTER — Other Ambulatory Visit: Payer: Self-pay | Admitting: Internal Medicine

## 2016-12-16 DIAGNOSIS — B029 Zoster without complications: Secondary | ICD-10-CM

## 2016-12-22 DIAGNOSIS — N183 Chronic kidney disease, stage 3 (moderate): Secondary | ICD-10-CM | POA: Diagnosis not present

## 2016-12-22 DIAGNOSIS — I10 Essential (primary) hypertension: Secondary | ICD-10-CM | POA: Diagnosis not present

## 2016-12-22 DIAGNOSIS — N2581 Secondary hyperparathyroidism of renal origin: Secondary | ICD-10-CM | POA: Diagnosis not present

## 2016-12-22 DIAGNOSIS — D631 Anemia in chronic kidney disease: Secondary | ICD-10-CM | POA: Diagnosis not present

## 2016-12-23 ENCOUNTER — Encounter: Payer: Self-pay | Admitting: Endocrinology

## 2016-12-23 ENCOUNTER — Other Ambulatory Visit: Payer: Self-pay | Admitting: Endocrinology

## 2016-12-24 ENCOUNTER — Telehealth (HOSPITAL_COMMUNITY): Payer: Self-pay

## 2016-12-24 NOTE — Telephone Encounter (Signed)
I called and left message on patient voicemail to call office about scheduling for cardiac rehab. I left office contact information on patient voicemail to return call.  ° °

## 2016-12-30 NOTE — Telephone Encounter (Signed)
Check with patient if he is still having low blood sugars in the morning and also make sure he is not taking insulin now Also need to know what his blood sugars are after dinner at night If his blood sugars are still low waking up he needs to reduce his Prandin from 4 mg down to 2 mg at suppertime

## 2016-12-31 ENCOUNTER — Telehealth (HOSPITAL_COMMUNITY): Payer: Self-pay

## 2016-12-31 NOTE — Telephone Encounter (Signed)
I called and left message on patient voicemail to call office about scheduling for cardiac rehab. I left office contact information on patient voicemail to return call.  ° °

## 2017-01-01 ENCOUNTER — Other Ambulatory Visit: Payer: Medicare Other

## 2017-01-01 ENCOUNTER — Other Ambulatory Visit (INDEPENDENT_AMBULATORY_CARE_PROVIDER_SITE_OTHER): Payer: Medicare Other

## 2017-01-01 DIAGNOSIS — E1165 Type 2 diabetes mellitus with hyperglycemia: Secondary | ICD-10-CM | POA: Diagnosis not present

## 2017-01-01 LAB — HEMOGLOBIN A1C: Hgb A1c MFr Bld: 5.9 % (ref 4.6–6.5)

## 2017-01-01 LAB — BASIC METABOLIC PANEL
BUN: 22 mg/dL (ref 6–23)
CO2: 21 meq/L (ref 19–32)
Calcium: 8.7 mg/dL (ref 8.4–10.5)
Chloride: 110 mEq/L (ref 96–112)
Creatinine, Ser: 1.41 mg/dL (ref 0.40–1.50)
GFR: 53.03 mL/min — ABNORMAL LOW (ref >60.00)
Glucose, Bld: 104 mg/dL — ABNORMAL HIGH (ref 70–99)
Potassium: 5 mEq/L (ref 3.5–5.1)
SODIUM: 138 meq/L (ref 135–145)

## 2017-01-02 ENCOUNTER — Other Ambulatory Visit: Payer: Medicare Other

## 2017-01-02 ENCOUNTER — Other Ambulatory Visit: Payer: Self-pay | Admitting: Endocrinology

## 2017-01-05 ENCOUNTER — Other Ambulatory Visit: Payer: Medicare Other

## 2017-01-05 DIAGNOSIS — Z79899 Other long term (current) drug therapy: Secondary | ICD-10-CM

## 2017-01-05 DIAGNOSIS — B20 Human immunodeficiency virus [HIV] disease: Secondary | ICD-10-CM

## 2017-01-05 LAB — CBC
HEMATOCRIT: 34.2 % — AB (ref 38.5–50.0)
HEMOGLOBIN: 11.3 g/dL — AB (ref 13.2–17.1)
MCH: 32 pg (ref 27.0–33.0)
MCHC: 33 g/dL (ref 32.0–36.0)
MCV: 96.9 fL (ref 80.0–100.0)
MPV: 9.3 fL (ref 7.5–12.5)
PLATELETS: 203 10*3/uL (ref 140–400)
RBC: 3.53 MIL/uL — AB (ref 4.20–5.80)
RDW: 14.9 % (ref 11.0–15.0)
WBC: 4.1 10*3/uL (ref 3.8–10.8)

## 2017-01-06 ENCOUNTER — Other Ambulatory Visit: Payer: Medicare Other

## 2017-01-06 LAB — COMPREHENSIVE METABOLIC PANEL
ALBUMIN: 3.9 g/dL (ref 3.6–5.1)
ALK PHOS: 55 U/L (ref 40–115)
ALT: 19 U/L (ref 9–46)
AST: 19 U/L (ref 10–35)
BUN: 26 mg/dL — AB (ref 7–25)
CO2: 18 mmol/L — ABNORMAL LOW (ref 20–31)
CREATININE: 1.45 mg/dL — AB (ref 0.70–1.25)
Calcium: 9.2 mg/dL (ref 8.6–10.3)
Chloride: 108 mmol/L (ref 98–110)
Glucose, Bld: 141 mg/dL — ABNORMAL HIGH (ref 65–99)
POTASSIUM: 5 mmol/L (ref 3.5–5.3)
Sodium: 137 mmol/L (ref 135–146)
TOTAL PROTEIN: 6.4 g/dL (ref 6.1–8.1)
Total Bilirubin: 0.5 mg/dL (ref 0.2–1.2)

## 2017-01-06 LAB — LIPID PANEL
CHOL/HDL RATIO: 4.1 ratio (ref <5.0)
CHOLESTEROL: 122 mg/dL (ref <200)
HDL: 30 mg/dL — ABNORMAL LOW (ref >40)
LDL Cholesterol: 55 mg/dL (ref <100)
Triglycerides: 183 mg/dL — ABNORMAL HIGH (ref <150)
VLDL: 37 mg/dL — ABNORMAL HIGH (ref <30)

## 2017-01-06 LAB — T-HELPER CELL (CD4) - (RCID CLINIC ONLY)
CD4 % Helper T Cell: 47 % (ref 33–55)
CD4 T CELL ABS: 370 /uL — AB (ref 400–2700)

## 2017-01-06 LAB — RPR

## 2017-01-07 ENCOUNTER — Encounter: Payer: Self-pay | Admitting: Endocrinology

## 2017-01-07 ENCOUNTER — Ambulatory Visit (INDEPENDENT_AMBULATORY_CARE_PROVIDER_SITE_OTHER): Payer: Medicare Other | Admitting: Endocrinology

## 2017-01-07 VITALS — BP 106/54 | HR 69 | Ht 63.0 in | Wt 135.0 lb

## 2017-01-07 DIAGNOSIS — E1165 Type 2 diabetes mellitus with hyperglycemia: Secondary | ICD-10-CM | POA: Diagnosis not present

## 2017-01-07 DIAGNOSIS — I2510 Atherosclerotic heart disease of native coronary artery without angina pectoris: Secondary | ICD-10-CM

## 2017-01-07 DIAGNOSIS — Z9861 Coronary angioplasty status: Secondary | ICD-10-CM

## 2017-01-07 LAB — HIV-1 RNA QUANT-NO REFLEX-BLD
HIV 1 RNA QUANT: DETECTED {copies}/mL — AB
HIV-1 RNA Quant, Log: <1.3 Log copies/mL — AB

## 2017-01-07 NOTE — Patient Instructions (Signed)
Check blood sugars on waking up 3/7   Also check blood sugars about 2 hours after a meal and do this after different meals by rotation  Recommended blood sugar levels on waking up is 90-130 and about 2 hours after meal is 130-160  Please bring your blood sugar monitor to each visit, thank you  

## 2017-01-07 NOTE — Progress Notes (Signed)
Patient ID: [name], male   DOB: [birth year], 71 y.o.   MRN: [id number]           Reason for Appointment: Follow-up for Type 2 Diabetes  Referring physician: Charlett Blake  History of Present Illness:          Date of diagnosis of type 2 diabetes mellitus: 2013        Background history:  He only mildly increased blood sugar levels at the time of diagnosis and A1c was 7.1  He had been treated initially with metformin but this was stopped subsequently when his renal function was worse  He was subsequently treated with glipizide but his blood sugars had been poorly controlled in 2016 with glipizide alone Since his A1c has been persistently over 9% he was started on Januvia 50 mg in 01/2015 At initial consultation he had a high A1c of 9.5; he was switched from glipizide to Prandin in 02/2015  Recent history:    Oral hypoglycemic drugs: Prandin 4 mg at breakfast  and 2 mg before lunch and supper, Actos 15 mg daily, metformin ER 500 mg daily  Because of persistently high fasting readings he was started on Toujeo on 06/11/16, however this was stopped in April 2018 when he was getting hypoglycemia  His A1c is now 5.9, previously 6.3, highest this year was 7.6  Current blood sugar patterns and problems identified:  He started to get hypoglycemia again even after stopping the insulin especially overnight  With this he was told to reduce his Prandin down to 2 mg at suppertime and try to check more readings at night  However he also has cut back his metformin to 1 tablet since he was having diarrhea subsequently with taking 1000 mg a day  Since then he has had only one low blood sugar overnight on 12/31/16 which was 53  He is checking blood sugars mostly fasting and these are near normal  Has a few readings in the afternoon and late evening but not recently, these are excellent also  Since his MI he has not been active and has done only a little walking  Weight is stable  He doesn't think  he has changed his diet and is eating usually healthy meals Dinner is at 5-6 pm  Side effects from medications have been: None  Compliance with the medical regimen: Good, usually trying to take his Prandin before eating   Glucose monitoring:  done usually 0-1  times a day         Glucometer:  FreeStyle     Blood Glucose readings by download, has only a few readings recently and all fasting:  Mean values apply above for all meters except median for One Touch  PRE-MEAL Fasting Lunch Dinner Bedtime Overall  Glucose range: 77-130   77, 79  79-1 26  53-130   Mean/median: 98     95     Dietician visit, most recent: 8/15 DIET:  has been trying to reduce fried/usually not eating high fat  foods; eating oatmeal in the morningWith yogurt               Dinner 6 pm Exercise:  walking recently 0.5 miles, 3/7 days  .  Weight history:   Wt Readings from Last 3 Encounters:  01/07/17 135 lb (61.2 kg)  12/15/16 136 lb (61.7 kg)  12/02/16 134 lb 4.2 oz (60.9 kg)    Glycemic control:   Lab Results  Component Value Date   HGBA1C  5.9 01/01/2017   HGBA1C 6.3 (H) 11/23/2016   HGBA1C 7.6 (H) 09/22/2016   Lab Results  Component Value Date   MICROALBUR 21.2 (H) 06/06/2016   LDLCALC 55 01/05/2017   CREATININE 1.45 (H) 01/05/2017    Other active problems: See review of systems   Appointment on 01/05/2017  Component Date Value Ref Range Status  . CD4 T Cell Abs 01/05/2017 370* 400 - 2,700 /uL Final  . CD4 % Helper T Cell 01/05/2017 47  33 - 55 % Final   Performed at [facility], [place] [address], [place], [place] [address]  . WBC 01/05/2017 4.1  3.8 - 10.8 K/uL Final  . RBC 01/05/2017 3.53* 4.20 - 5.80 MIL/uL Final  . Hemoglobin 01/05/2017 11.3* 13.2 - 17.1 g/dL Final  . HCT 01/05/2017 34.2* 38.5 - 50.0 % Final  . MCV 01/05/2017 96.9  80.0 - 100.0 fL Final  . MCH 01/05/2017 32.0  27.0 - 33.0 pg Final  . MCHC 01/05/2017 33.0  32.0 - 36.0 g/dL Final  . RDW 01/05/2017  14.9  11.0 - 15.0 % Final  . Platelets 01/05/2017 203  140 - 400 K/uL Final  . MPV 01/05/2017 9.3  7.5 - 12.5 fL Final  . Sodium 01/05/2017 137  135 - 146 mmol/L Final  . Potassium 01/05/2017 5.0  3.5 - 5.3 mmol/L Final  . Chloride 01/05/2017 108  98 - 110 mmol/L Final  . CO2 01/05/2017 18* 20 - 31 mmol/L Final  . Glucose, Bld 01/05/2017 141* 65 - 99 mg/dL Final  . BUN 01/05/2017 26* 7 - 25 mg/dL Final  . Creat 01/05/2017 1.45* 0.70 - 1.25 mg/dL Final   Comment:   For patients > or = 69 years of age: The upper reference limit for Creatinine is approximately 13% higher for people identified as African-American.     . Total Bilirubin 01/05/2017 0.5  0.2 - 1.2 mg/dL Final  . Alkaline Phosphatase 01/05/2017 55  40 - 115 U/L Final  . AST 01/05/2017 19  10 - 35 U/L Final  . ALT 01/05/2017 19  9 - 46 U/L Final  . Total Protein 01/05/2017 6.4  6.1 - 8.1 g/dL Final  . Albumin 01/05/2017 3.9  3.6 - 5.1 g/dL Final  . Calcium 01/05/2017 9.2  8.6 - 10.3 mg/dL Final  . Cholesterol 01/05/2017 122  <200 mg/dL Final  . Triglycerides 01/05/2017 183* <150 mg/dL Final  . HDL 01/05/2017 30* >40 mg/dL Final  . Total CHOL/HDL Ratio 01/05/2017 4.1  <5.0 Ratio Final  . VLDL 01/05/2017 37* <30 mg/dL Final  . LDL Cholesterol 01/05/2017 55  <100 mg/dL Final  . RPR Ser Ql 01/05/2017 NON REAC  NON REAC Final  Appointment on 01/01/2017  Component Date Value Ref Range Status  . Hgb A1c MFr Bld 01/01/2017 5.9  4.6 - 6.5 % Final   Glycemic Control Guidelines for People with Diabetes:Non Diabetic:  <6%Goal of Therapy: <7%Additional Action Suggested:  >8%   . Sodium 01/01/2017 138  135 - 145 mEq/L Final  . Potassium 01/01/2017 5.0  3.5 - 5.1 mEq/L Final  . Chloride 01/01/2017 110  96 - 112 mEq/L Final  . CO2 01/01/2017 21  19 - 32 mEq/L Final  . Glucose, Bld 01/01/2017 104* 70 - 99 mg/dL Final  . BUN 01/01/2017 22  6 - 23 mg/dL Final  . Creatinine, Ser 01/01/2017 1.41  0.40 - 1.50 mg/dL Final  . Calcium  01/01/2017 8.7  8.4 - 10.5 mg/dL Final  .  GFR 01/01/2017 53.03* >60.00 mL/min Final       Allergies as of 01/07/2017   No Known Allergies     Medication List       Accurate as of 01/07/17  3:07 PM. Always use your most recent med list.          acetaminophen 325 MG tablet Commonly known as:  TYLENOL Take 2 tablets (650 mg total) by mouth every 4 (four) hours as needed for headache or mild pain.   aspirin 81 MG chewable tablet Chew 81 mg by mouth daily.   atorvastatin 40 MG tablet Commonly known as:  LIPITOR Take 1 tablet (40 mg total) by mouth daily at 6 PM.   b complex vitamins tablet Take 1 tablet by mouth daily.   B-D UF III MINI PEN NEEDLES 31G X 5 MM Misc Generic drug:  Insulin Pen Needle USE TWICE DAILY AS DIRECTED   Cholecalciferol 2000 units Caps Take 1 capsule (2,000 Units total) by mouth daily.   diclofenac 50 MG EC tablet Commonly known as:  VOLTAREN TAKE 1 TABLET(50 MG) BY MOUTH TWICE DAILY   Fish Oil 1000 MG Caps Take by mouth. Takes 2 daily   freestyle lancets Use as directed three times a day to check blood sugar.  DX E11.9   furosemide 20 MG tablet Commonly known as:  LASIX TAKE 2 TABLETS BY MOUTH DAILY AS NEEDED FOR FLUID RETENTION OR SWELLING   glucose blood test strip Commonly known as:  FREESTYLE TEST STRIPS Use three times daily to check blood sugar.  DX E11.9   isosorbide mononitrate 30 MG 24 hr tablet Commonly known as:  IMDUR Take 1 tablet (30 mg total) by mouth daily.   Krill Oil 300 MG Caps Take 1 capsule (300 mg total) by mouth daily.   lisinopril 2.5 MG tablet Commonly known as:  PRINIVIL,ZESTRIL Take 1 tablet (2.5 mg total) by mouth daily.   metFORMIN 500 MG 24 hr tablet Commonly known as:  GLUCOPHAGE-XR Take 2 tablets (1,000 mg total) by mouth daily with supper.   metoprolol tartrate 25 MG tablet Commonly known as:  LOPRESSOR Take 0.5 tablets (12.5 mg total) by mouth 2 (two) times daily.     neomycin-polymyxin-hydrocortisone OTIC solution Commonly known as:  CORTISPORIN INSTILL 3 DROPS IN BOTH EARS THREE TIMES DAILY   nitroGLYCERIN 0.4 MG SL tablet Commonly known as:  NITROSTAT Place 1 tablet (0.4 mg total) under the tongue every 5 (five) minutes as needed for chest pain.   NORVIR 100 MG Tabs tablet Generic drug:  ritonavir TAKE 1 TABLET(100 MG) BY MOUTH DAILY   omega-3 acid ethyl esters 1 g capsule Commonly known as:  LOVAZA Take 1 capsule (1 g total) by mouth 2 (two) times daily.   pantoprazole 40 MG tablet Commonly known as:  PROTONIX Take 1 tablet (40 mg total) by mouth daily.   pioglitazone 15 MG tablet Commonly known as:  ACTOS TAKE 1 TABLET(15 MG) BY MOUTH DAILY   potassium chloride SA 20 MEQ tablet Commonly known as:  K-DUR,KLOR-CON TAKE 1 TABLET(20 MEQ) BY MOUTH THREE TIMES DAILY   PREZISTA 800 MG tablet Generic drug:  darunavir TAKE 1 TABLET(800 MG) BY MOUTH DAILY   PROBIOTIC DAILY Caps Take 1 by mouth daily   repaglinide 2 MG tablet Commonly known as:  PRANDIN TAKE 2 TABLETS BY MOUTH BEFORE BREAKFAST, 1 TABLET BY MOUTH BEFORE LUNCH AND 2 TABLETS BY MOUTH BEFORE SUPPER   rilpivirine 25 MG Tabs tablet Commonly known as:  EDURANT Take 1 tablet (25 mg total) by mouth daily with breakfast.   sodium bicarbonate 650 MG tablet Take 650 mg by mouth 3 (three) times daily.   ticagrelor 90 MG Tabs tablet Commonly known as:  BRILINTA Take 1 tablet (90 mg total) by mouth 2 (two) times daily.   TIVICAY 50 MG tablet Generic drug:  dolutegravir TAKE 1 TABLET BY MOUTH DAILY   triamcinolone cream 0.1 % Commonly known as:  KENALOG APP TO DRY ITCHY SKIN BID PRN   valACYclovir 500 MG tablet Commonly known as:  VALTREX TAKE 1 TABLET BY MOUTH DAILY       Allergies: No Known Allergies  Past Medical History:  Diagnosis Date  . Arthritis   . CAD (coronary artery disease)    a. s/p CABG in 2003 b. s/p PCI to SVG-PDA in 07/2015 c. 11/2016: cath  showing severe native CAD with patent LIMA-LAD and SVG-D1 with 80% stenosis of SVG-OM1-OM2. Initially medical management was recommended --> presented with recurrent angina --> s/p Synergy DES to proximal body of SVG-OM1-OM2, POBA to distal graft.   . Chronic kidney disease   . Constipation 05/28/2014  . COPD (chronic obstructive pulmonary disease) ([place])   . Dermatitis 11/27/2012  . Diabetes mellitus   . Epicondylitis 06/05/2013   right  . GERD (gastroesophageal reflux disease)   . History of depression   . History of kidney stones   . HIV (human immunodeficiency virus infection) ([place]) 1991   on meds since initial dx.   . Hyperlipidemia   . Hypertension   . Nasal abscess 12/04/2015  . Pancreatitis 11/2013   attributed to HIV meds.     Past Surgical History:  Procedure Laterality Date  . CARDIAC CATHETERIZATION N/A 07/29/2015   Procedure: Left Heart Cath and Coronary Angiography;  Surgeon: [name], MD;  Location: [facility];  Service: Cardiovascular;  Laterality: N/A;  . CARDIAC CATHETERIZATION N/A 07/29/2015   Procedure: Coronary Stent Intervention;  Surgeon: [name], MD;  Location: [facility];  Service: Cardiovascular;  Laterality: N/A;  . CORONARY ARTERY BYPASS GRAFT  05/2002  . CORONARY STENT INTERVENTION N/A 12/01/2016   Procedure: Coronary Stent Intervention;  Surgeon: [name], MD;  Location: [facility];  Service: Cardiovascular;  Laterality: N/A;  . LEFT HEART CATH AND CORS/GRAFTS ANGIOGRAPHY N/A 11/24/2016   Procedure: Left Heart Cath and Cors/Grafts Angiography;  Surgeon: Nelva Bush, MD;  Location: [facility];  Service: Cardiovascular;  Laterality: N/A;  . LEFT HEART CATH AND CORS/GRAFTS ANGIOGRAPHY N/A 12/01/2016   Procedure: Left Heart Cath and Cors/Grafts Angiography;  Surgeon: [name], MD;  Location: [facility];  Service: Cardiovascular;  Laterality: N/A;  . VASECTOMY      Family History  Problem  Relation Age of Onset  . Hyperlipidemia Mother   . Diabetes Mother        paternal grandparents/1 brother  . Hyperlipidemia Father   . Hypertension Father        paternal grandmother/3 brothers/1 sister  . Arthritis Unknown        mother/father/paternal grandparents  . Breast cancer Maternal Aunt        paternal aunt  . Lung cancer Maternal Aunt   . Heart disease Unknown        parents/maternal grandparents/ 2 brothers  . Stroke Paternal Grandmother   . Mental retardation Sister     Social History:  reports that he has never smoked. He  has never used smokeless tobacco. He reports that he drinks alcohol. He reports that he does not use drugs.    Review of Systems    Lipid history: On Lipitor 40 mg, followed by PCP His triglycerides are  better However he is currently on 2 out of his fish oil supplement, despite instructions he does not take more than 1 a day   Lab Results  Component Value Date   CHOL 122 01/05/2017   HDL 30 (L) 01/05/2017   LDLCALC 55 01/05/2017   LDLDIRECT 72.0 11/04/2016   TRIG 183 (H) 01/05/2017   CHOLHDL 4.1 01/05/2017            RENAL dysfunction: Followed by nephrologist,This is mild  and stable He has history of edema  Has been told to reduce or eliminate use of diclofenac   Lab Results  Component Value Date   CREATININE 1.45 (H) 01/05/2017   BUN 26 (H) 01/05/2017   NA 137 01/05/2017   K 5.0 01/05/2017   CL 108 01/05/2017   CO2 18 (L) 01/05/2017     Hypertension: Has been treated with various drugs in the past, Recently taking 2.5 mg lisinopril Had edema with 10 mg amlodipine   BP Readings from Last 3 Encounters:  01/07/17 (!) 106/54  12/15/16 (!) 82/50  12/02/16 113/66    Diabetic foot exam  in 09/2016 shows normal monofilament sensation in the toes and plantar surfaces, no skin lesions or ulcers on the feet and absent pedal pulses    Physical Examination:  BP (!) 106/54   Pulse 69   Ht 5\' 3"  (1.6 m)   Wt 135 lb (61.2  kg)   SpO2 98%   BMI 23.91 kg/m     ASSESSMENT:  Diabetes type 2, uncontrolled with BMI 23; has  abdominal obesity   See history of present illness for detailed discussion of current diabetes management, blood sugar patterns and problems identified  His Blood sugars are Mostly near normal Not clear why he was having hypoglycemia recently, may be related to his recent MI, however has not lost weight Blood sugars are not getting low overnight with reducing Prandin at suppertime and also metformin down to 500 mg which is the most he can tolerate No side effects from Actos  HIGH triglycerides: Improved He will continue OTC fish oil and a statin drug prescribed by cardiologist    PLAN:  No change in medication regimen now Increase to check more readings after meals He will call if he tends to have more sugars that are below 70 at any time Recommended that he start increasing his walking as allowed by cardiologist     Patient Instructions  Check blood sugars on waking up  3/7  Also check blood sugars about 2 hours after a meal and do this after different meals by rotation  Recommended blood sugar levels on waking up is 90-130 and about 2 hours after meal is 130-160  Please bring your blood sugar monitor to each visit, thank you     [facility] 01/07/2017, 3:07 PM   Note: This office note was prepared with Dragon voice recognition system technology. Any transcriptional errors that result from this process are unintentional.

## 2017-01-13 ENCOUNTER — Ambulatory Visit (INDEPENDENT_AMBULATORY_CARE_PROVIDER_SITE_OTHER)
Admission: RE | Admit: 2017-01-13 | Discharge: 2017-01-13 | Disposition: A | Discharge status: Home / Self Care | Payer: Medicare Other | Source: Ambulatory Visit | Attending: Adult Health | Admitting: Adult Health

## 2017-01-13 ENCOUNTER — Ambulatory Visit (INDEPENDENT_AMBULATORY_CARE_PROVIDER_SITE_OTHER): Payer: Medicare Other | Admitting: Adult Health

## 2017-01-13 ENCOUNTER — Ambulatory Visit: Payer: Medicare Other | Admitting: Adult Health

## 2017-01-13 ENCOUNTER — Encounter: Payer: Self-pay | Admitting: Adult Health

## 2017-01-13 VITALS — BP 108/70 | Temp 97.6°F | Ht 63.0 in | Wt 136.7 lb

## 2017-01-13 DIAGNOSIS — W19XXXA Unspecified fall, initial encounter: Secondary | ICD-10-CM

## 2017-01-13 DIAGNOSIS — M25551 Pain in right hip: Secondary | ICD-10-CM

## 2017-01-13 DIAGNOSIS — S79921A Unspecified injury of right thigh, initial encounter: Secondary | ICD-10-CM | POA: Diagnosis not present

## 2017-01-13 DIAGNOSIS — M79651 Pain in right thigh: Secondary | ICD-10-CM | POA: Diagnosis not present

## 2017-01-13 DIAGNOSIS — S79911A Unspecified injury of right hip, initial encounter: Secondary | ICD-10-CM | POA: Diagnosis not present

## 2017-01-13 MED ORDER — ONDANSETRON HCL 4 MG PO TABS: 4.0000 mg | ORAL_TABLET | Freq: Three times a day (TID) | ORAL | 0 refills | Status: DC | PRN

## 2017-01-13 NOTE — Progress Notes (Signed)
Subjective:    Patient ID: [name], male    DOB: September 02, 1945, 71 y.o.   MRN: [id number]  HPI  71 year old male who  has a past medical history of Arthritis; CAD (coronary artery disease); Chronic kidney disease; Constipation (05/28/2014); COPD (chronic obstructive pulmonary disease) ([place]); Dermatitis (11/27/2012); Diabetes mellitus; Epicondylitis (06/05/2013); GERD (gastroesophageal reflux disease); History of depression; History of kidney stones; HIV (human immunodeficiency virus infection) ([place]) (1991); Hyperlipidemia; Hypertension; Nasal abscess (12/04/2015); and Pancreatitis (11/2013).  He present to the office today s/p fall while at the pool 6 days ago.  He was sitting on the edge of the pool and when he went to get up, he lost his balance and fell onto the concrete. He reports falling onto his right hip and right elbow. He denies any trauma to the right elbow, except for minor abrasions. His biggest concern is right hip pain. Pain is worse when changing positions, getting in and out of his car, and getting into bed. He has been using tylenol with some relief. Denies any bruising. He is able to walk but has slight discomfort.   He denies hitting his head or any LOC   Review of Systems See HPI   Past Medical History:  Diagnosis Date  . Arthritis   . CAD (coronary artery disease)    a. s/p CABG in 2003 b. s/p PCI to SVG-PDA in 07/2015 c. 11/2016: cath showing severe native CAD with patent LIMA-LAD and SVG-D1 with 80% stenosis of SVG-OM1-OM2. Initially medical management was recommended --> presented with recurrent angina --> s/p Synergy DES to proximal body of SVG-OM1-OM2, POBA to distal graft.   . Chronic kidney disease   . Constipation 05/28/2014  . COPD (chronic obstructive pulmonary disease) ([place])   . Dermatitis 11/27/2012  . Diabetes mellitus   . Epicondylitis 06/05/2013   right  . GERD (gastroesophageal reflux disease)   . History of depression   . History of kidney stones     . HIV (human immunodeficiency virus infection) ([place]) 1991   on meds since initial dx.   . Hyperlipidemia   . Hypertension   . Nasal abscess 12/04/2015  . Pancreatitis 11/2013   attributed to HIV meds.     Social History   Social History  . Marital status: Divorced    Spouse name: N/A  . Number of children: 4  . Years of education: N/A   Occupational History  . 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    Social History Main Topics  . Smoking status: Never Smoker  . Smokeless tobacco: Never Used  . Alcohol use 0.0 oz/week     Comment: rare  . Drug use: No  . Sexual activity: Not Currently     Comment: declined condoms   Other Topics Concern  . Not on file   Social History Narrative   Lives alone.  Supportive friends and family.  His HIV Dx is not a secret.     Past Surgical History:  Procedure Laterality Date  . CARDIAC CATHETERIZATION N/A 07/29/2015   Procedure: Left Heart Cath and Coronary Angiography;  Surgeon: [name], MD;  Location: [facility];  Service: Cardiovascular;  Laterality: N/A;  . CARDIAC CATHETERIZATION N/A 07/29/2015   Procedure: Coronary Stent Intervention;  Surgeon: [name], MD;  Location: [facility];  Service: Cardiovascular;  Laterality: N/A;  . CORONARY ARTERY BYPASS GRAFT  05/2002  .  CORONARY STENT INTERVENTION N/A 12/01/2016   Procedure: Coronary Stent Intervention;  Surgeon: [name], MD;  Location: [facility];  Service: Cardiovascular;  Laterality: N/A;  . LEFT HEART CATH AND CORS/GRAFTS ANGIOGRAPHY N/A 11/24/2016   Procedure: Left Heart Cath and Cors/Grafts Angiography;  Surgeon: Nelva Bush, MD;  Location: [facility];  Service: Cardiovascular;  Laterality: N/A;  . LEFT HEART CATH AND CORS/GRAFTS ANGIOGRAPHY N/A 12/01/2016   Procedure: Left Heart Cath and Cors/Grafts Angiography;  Surgeon: [name], MD;  Location: [facility];  Service:  Cardiovascular;  Laterality: N/A;  . VASECTOMY      Family History  Problem Relation Age of Onset  . Hyperlipidemia Mother   . Diabetes Mother        paternal grandparents/1 brother  . Hyperlipidemia Father   . Hypertension Father        paternal grandmother/3 brothers/1 sister  . Arthritis Unknown        mother/father/paternal grandparents  . Breast cancer Maternal Aunt        paternal aunt  . Lung cancer Maternal Aunt   . Heart disease Unknown        parents/maternal grandparents/ 2 brothers  . Stroke Paternal Grandmother   . Mental retardation Sister     No Known Allergies  Current Outpatient Prescriptions on File Prior to Visit  Medication Sig Dispense Refill  . acetaminophen (TYLENOL) 325 MG tablet Take 2 tablets (650 mg total) by mouth every 4 (four) hours as needed for headache or mild pain.    Marland Kitchen aspirin 81 MG chewable tablet Chew 81 mg by mouth daily.    Marland Kitchen atorvastatin (LIPITOR) 40 MG tablet Take 1 tablet (40 mg total) by mouth daily at 6 PM. 90 tablet 3  . b complex vitamins tablet Take 1 tablet by mouth daily. 30 tablet 11  . B-D UF III MINI PEN NEEDLES 31G X 5 MM MISC USE TWICE DAILY AS DIRECTED 100 each 0  . Cholecalciferol 2000 units CAPS Take 1 capsule (2,000 Units total) by mouth daily. 30 each 11  . diclofenac (VOLTAREN) 50 MG EC tablet TAKE 1 TABLET(50 MG) BY MOUTH TWICE DAILY 180 tablet 0  . furosemide (LASIX) 20 MG tablet TAKE 2 TABLETS BY MOUTH DAILY AS NEEDED FOR FLUID RETENTION OR SWELLING 60 tablet 0  . glucose blood (FREESTYLE TEST STRIPS) test strip Use three times daily to check blood sugar.  DX E11.9 100 each 6  . isosorbide mononitrate (IMDUR) 30 MG 24 hr tablet Take 1 tablet (30 mg total) by mouth daily. 30 tablet 5  . Lancets (FREESTYLE) lancets Use as directed three times a day to check blood sugar.  DX E11.9 100 each 11  . lisinopril (PRINIVIL,ZESTRIL) 2.5 MG tablet Take 1 tablet (2.5 mg total) by mouth daily.    . metFORMIN (GLUCOPHAGE-XR) 500  MG 24 hr tablet Take 2 tablets (1,000 mg total) by mouth daily with supper. (Patient taking differently: Take 500 mg by mouth daily with supper. ) 60 tablet 3  . metoprolol tartrate (LOPRESSOR) 25 MG tablet Take 0.5 tablets (12.5 mg total) by mouth 2 (two) times daily. 60 tablet 5  . neomycin-polymyxin-hydrocortisone (CORTISPORIN) otic solution INSTILL 3 DROPS IN BOTH EARS THREE TIMES DAILY 10 mL 0  . nitroGLYCERIN (NITROSTAT) 0.4 MG SL tablet Place 1 tablet (0.4 mg total) under the tongue every 5 (five) minutes as needed for chest pain. 25 tablet 3  . NORVIR 100 MG TABS tablet  TAKE 1 TABLET(100 MG) BY MOUTH DAILY 30 tablet 11  . omega-3 acid ethyl esters (LOVAZA) 1 g capsule Take 1 capsule (1 g total) by mouth 2 (two) times daily. 120 capsule 2  . Omega-3 Fatty Acids (FISH OIL) 1000 MG CAPS Take by mouth. Takes 2 daily    . pantoprazole (PROTONIX) 40 MG tablet Take 1 tablet (40 mg total) by mouth daily. 30 tablet 5  . pioglitazone (ACTOS) 15 MG tablet TAKE 1 TABLET(15 MG) BY MOUTH DAILY 30 tablet 0  . potassium chloride SA (K-DUR,KLOR-CON) 20 MEQ tablet TAKE 1 TABLET(20 MEQ) BY MOUTH THREE TIMES DAILY 180 tablet 3  . PREZISTA 800 MG tablet TAKE 1 TABLET(800 MG) BY MOUTH DAILY 30 tablet 11  . Probiotic Product (PROBIOTIC DAILY) CAPS Take 1 by mouth daily 30 capsule 11  . repaglinide (PRANDIN) 2 MG tablet TAKE 2 TABLETS BY MOUTH BEFORE BREAKFAST, 1 TABLET BY MOUTH BEFORE LUNCH AND 2 TABLETS BY MOUTH BEFORE SUPPER 450 tablet 0  . rilpivirine (EDURANT) 25 MG TABS tablet Take 1 tablet (25 mg total) by mouth daily with breakfast. 30 tablet 5  . sodium bicarbonate 650 MG tablet Take 650 mg by mouth 3 (three) times daily.    . ticagrelor (BRILINTA) 90 MG TABS tablet Take 1 tablet (90 mg total) by mouth 2 (two) times daily. 180 tablet 3  . TIVICAY 50 MG tablet TAKE 1 TABLET BY MOUTH DAILY 30 tablet 5  . triamcinolone cream (KENALOG) 0.1 % APP TO DRY ITCHY SKIN BID PRN  2  . valACYclovir (VALTREX) 500 MG  tablet TAKE 1 TABLET BY MOUTH DAILY 30 tablet 5   No current facility-administered medications on file prior to visit.     BP 108/70 (BP Location: Left Arm, Patient Position: Sitting, Cuff Size: Normal)   Temp 97.6 F (36.4 C) (Oral)   Ht 5\' 3"  (1.6 m)   Wt 136 lb 11.2 oz (62 kg)   BMI 24.22 kg/m       Objective:   Physical Exam  Constitutional: He is oriented to person, place, and time. He appears well-developed and well-nourished. No distress.  Cardiovascular: Normal rate, regular rhythm, normal heart sounds and intact distal pulses.  Exam reveals no gallop and no friction rub.   No murmur heard. Pulmonary/Chest: Effort normal and breath sounds normal. No respiratory distress. He has no wheezes. He has no rales. He exhibits no tenderness.  Musculoskeletal: He exhibits tenderness. He exhibits no edema or deformity.  Has pain to right hip and right upper femur with palpation. Pain to right hip with knee to chest and internal rotation.   Neurological: He is alert and oriented to person, place, and time.  Skin: Skin is warm and dry. No rash noted. He is not diaphoretic. No erythema. No pallor.  Psychiatric: He has a normal mood and affect. His behavior is normal. Judgment and thought content normal.  Nursing note and vitals reviewed.     Assessment & Plan:  1. Right hip pain - Need to /ro fracture. Likely muscular in nature as the fall was from sitting.  - Consider MRI  - DG HIP UNILAT WITH PELVIS 2-3 VIEWS RIGHT; Future - DG FEMUR, MIN 2 VIEWS RIGHT; Future - Continue with Tylenol  - Add heating pad  - Follow up as needed  Dorothyann Peng, NP

## 2017-01-14 ENCOUNTER — Other Ambulatory Visit: Payer: Self-pay

## 2017-01-14 MED ORDER — PIOGLITAZONE HCL 15 MG PO TABS: ORAL_TABLET | ORAL | 2 refills | Status: DC

## 2017-01-20 ENCOUNTER — Encounter: Payer: Self-pay | Admitting: Internal Medicine

## 2017-01-20 ENCOUNTER — Ambulatory Visit (INDEPENDENT_AMBULATORY_CARE_PROVIDER_SITE_OTHER): Payer: Medicare Other | Admitting: Internal Medicine

## 2017-01-20 DIAGNOSIS — Z9861 Coronary angioplasty status: Secondary | ICD-10-CM

## 2017-01-20 DIAGNOSIS — B20 Human immunodeficiency virus [HIV] disease: Secondary | ICD-10-CM | POA: Diagnosis not present

## 2017-01-20 DIAGNOSIS — I10 Essential (primary) hypertension: Secondary | ICD-10-CM

## 2017-01-20 DIAGNOSIS — I2510 Atherosclerotic heart disease of native coronary artery without angina pectoris: Secondary | ICD-10-CM

## 2017-01-20 NOTE — Assessment & Plan Note (Signed)
He will continue to monitor his blood pressure at home on a regular basis. I suggested that he speak with his cardiologist, Dr. Stanford Breed, if his blood pressure continues to run lower than normal.

## 2017-01-20 NOTE — Progress Notes (Signed)
Patient Active Problem List   Diagnosis Date Noted  . DM (diabetes mellitus), type 2 with ophthalmic complications ([place]) 35/59/7416    Priority: High  . Human immunodeficiency virus (HIV) disease ([place]) 05/12/2006    Priority: High  . CAD- S/P PCI SVG-OM1 12/01/16 05/12/2006    Priority: High  . Chronic renal insufficiency, stage III (moderate) 12/02/2016  . NSTEMI (non-ST elevated myocardial infarction) ([place]) 11/29/2016  . Unstable angina ([place]) 11/24/2016  . Chest pain 11/22/2016  . Aortic stenosis 09/17/2015  . STEMI (ST elevation myocardial infarction) ([place]) 07/29/2015  . Lesion of breast 07/21/2015  . Carotid bruit 07/10/2015  . Metatarsal deformity 01/24/2015  . Keratoma 01/24/2015  . Pain in joint, ankle and foot 01/19/2015  . Absolute anemia 05/28/2014  . Constipation 05/28/2014  . Pedal edema 05/21/2014  . Posterior cervical lymphadenopathy 03/30/2014  . Hyperkalemia 03/23/2014  . Hyponatremia 03/23/2014  . Protein-calorie malnutrition, severe ([place]) 03/23/2014  . Diarrhea 11/20/2013  . HLD (hyperlipidemia) 11/19/2013  . Ascites 11/18/2013  . Pancreatitis 11/15/2013  . Epicondylitis 06/05/2013  . Nocturia 03/06/2013  . Dermatitis 11/27/2012  . Night sweats 07/06/2012  . Erectile dysfunction 01/01/2012  . Lipodystrophy 12/20/2010  . Dry eye syndrome 12/20/2010  . BELLS PALSY 07/19/2010  . GENITAL HERPES 05/03/2009  . SHINGLES, HX OF 05/03/2009  . WEIGHT LOSS, ABNORMAL 04/03/2009  . INGUINAL LYMPHADENOPATHY, RIGHT 04/03/2009  . MEMORY LOSS 09/18/2008  . PERIPHERAL VASCULAR DISEASE 07/20/2008  . COPD 07/20/2008  . HIP PAIN, BILATERAL 07/17/2008  . KNEE PAIN, BILATERAL 07/17/2008  . HEMATOCHEZIA 04/18/2008  . NEPHROLITHIASIS, HX OF 02/22/2008  . DEPRESSION 09/03/2006  . GERD 09/03/2006  . Hx of CABG 09/03/2006  . HEARING LOSS, SENSORINEURAL 05/12/2006  . Essential hypertension 05/12/2006    Patient's Medications  New Prescriptions   No  medications on file  Previous Medications   ACETAMINOPHEN (TYLENOL) 325 MG TABLET    Take 2 tablets (650 mg total) by mouth every 4 (four) hours as needed for headache or mild pain.   ASPIRIN 81 MG CHEWABLE TABLET    Chew 81 mg by mouth daily.   ATORVASTATIN (LIPITOR) 40 MG TABLET    Take 1 tablet (40 mg total) by mouth daily at 6 PM.   B COMPLEX VITAMINS TABLET    Take 1 tablet by mouth daily.   B-D UF III MINI PEN NEEDLES 31G X 5 MM MISC    USE TWICE DAILY AS DIRECTED   CHOLECALCIFEROL 2000 UNITS CAPS    Take 1 capsule (2,000 Units total) by mouth daily.   DICLOFENAC (VOLTAREN) 50 MG EC TABLET    TAKE 1 TABLET(50 MG) BY MOUTH TWICE DAILY   FUROSEMIDE (LASIX) 20 MG TABLET    TAKE 2 TABLETS BY MOUTH DAILY AS NEEDED FOR FLUID RETENTION OR SWELLING   GLUCOSE BLOOD (FREESTYLE TEST STRIPS) TEST STRIP    Use three times daily to check blood sugar.  DX E11.9   ISOSORBIDE MONONITRATE (IMDUR) 30 MG 24 HR TABLET    Take 1 tablet (30 mg total) by mouth daily.   LANCETS (FREESTYLE) LANCETS    Use as directed three times a day to check blood sugar.  DX E11.9   LISINOPRIL (PRINIVIL,ZESTRIL) 2.5 MG TABLET    Take 1 tablet (2.5 mg total) by mouth daily.   METFORMIN (GLUCOPHAGE-XR) 500 MG 24 HR TABLET    Take 2 tablets (1,000 mg total) by mouth daily with supper.   METOPROLOL  TARTRATE (LOPRESSOR) 25 MG TABLET    Take 0.5 tablets (12.5 mg total) by mouth 2 (two) times daily.   NEOMYCIN-POLYMYXIN-HYDROCORTISONE (CORTISPORIN) OTIC SOLUTION    INSTILL 3 DROPS IN BOTH EARS THREE TIMES DAILY   NITROGLYCERIN (NITROSTAT) 0.4 MG SL TABLET    Place 1 tablet (0.4 mg total) under the tongue every 5 (five) minutes as needed for chest pain.   NORVIR 100 MG TABS TABLET    TAKE 1 TABLET(100 MG) BY MOUTH DAILY   OMEGA-3 ACID ETHYL ESTERS (LOVAZA) 1 G CAPSULE    Take 1 capsule (1 g total) by mouth 2 (two) times daily.   OMEGA-3 FATTY ACIDS (FISH OIL) 1000 MG CAPS    Take by mouth. Takes 2 daily   ONDANSETRON (ZOFRAN) 4 MG TABLET     Take 1 tablet (4 mg total) by mouth every 8 (eight) hours as needed for nausea or vomiting.   PANTOPRAZOLE (PROTONIX) 40 MG TABLET    Take 1 tablet (40 mg total) by mouth daily.   PIOGLITAZONE (ACTOS) 15 MG TABLET    TAKE 1 TABLET(15 MG) BY MOUTH DAILY   POTASSIUM CHLORIDE SA (K-DUR,KLOR-CON) 20 MEQ TABLET    TAKE 1 TABLET(20 MEQ) BY MOUTH THREE TIMES DAILY   PREZISTA 800 MG TABLET    TAKE 1 TABLET(800 MG) BY MOUTH DAILY   PROBIOTIC PRODUCT (PROBIOTIC DAILY) CAPS    Take 1 by mouth daily   REPAGLINIDE (PRANDIN) 2 MG TABLET    TAKE 2 TABLETS BY MOUTH BEFORE BREAKFAST, 1 TABLET BY MOUTH BEFORE LUNCH AND 2 TABLETS BY MOUTH BEFORE SUPPER   RILPIVIRINE (EDURANT) 25 MG TABS TABLET    Take 1 tablet (25 mg total) by mouth daily with breakfast.   SODIUM BICARBONATE 650 MG TABLET    Take 650 mg by mouth 3 (three) times daily.   TICAGRELOR (BRILINTA) 90 MG TABS TABLET    Take 1 tablet (90 mg total) by mouth 2 (two) times daily.   TIVICAY 50 MG TABLET    TAKE 1 TABLET BY MOUTH DAILY   TRIAMCINOLONE CREAM (KENALOG) 0.1 %    APP TO DRY ITCHY SKIN BID PRN   VALACYCLOVIR (VALTREX) 500 MG TABLET    TAKE 1 TABLET BY MOUTH DAILY  Modified Medications   No medications on file  Discontinued Medications   No medications on file    Subjective: [name] is in for his routine HIV follow-up visit. He has had no problems obtaining, taking or tolerating his Edurant, Tivicay, Prezista and Norvir. He was hospitalized with chest pain and a NSTEMI several months ago. He tells me that his blood pressure was lower than normal and one of his blood pressure medications was stopped. About 3 weeks ago he was at the pool with his granddaughter. He stood up and the next thing he knows he woke up on the concrete surrounding the pool. He does not believe he passed out. He was able to stand up and take care of his granddaughter without any difficulty. He says that he has been monitoring his blood pressure and it has been low on several  occasions. His systolic blood pressure has occasionally been less than 100. He has not had any other falls or episodes where he felt dizzy or lightheaded.  Review of Systems: Review of Systems  Constitutional: Negative for chills, diaphoresis, fever, malaise/fatigue and weight loss.  HENT: Negative for sore throat.   Respiratory: Negative for cough, sputum production and shortness of breath.   Cardiovascular:  Negative for chest pain.  Gastrointestinal: Negative for abdominal pain, diarrhea, heartburn, nausea and vomiting.  Genitourinary: Negative for dysuria and frequency.  Musculoskeletal: Negative for joint pain and myalgias.  Skin: Negative for rash.  Neurological: Negative for dizziness and headaches.       As noted in history of present illness.  Psychiatric/Behavioral: Negative for depression and substance abuse. The patient is not nervous/anxious.     Past Medical History:  Diagnosis Date  . Arthritis   . CAD (coronary artery disease)    a. s/p CABG in 2003 b. s/p PCI to SVG-PDA in 07/2015 c. 11/2016: cath showing severe native CAD with patent LIMA-LAD and SVG-D1 with 80% stenosis of SVG-OM1-OM2. Initially medical management was recommended --> presented with recurrent angina --> s/p Synergy DES to proximal body of SVG-OM1-OM2, POBA to distal graft.   . Chronic kidney disease   . Constipation 05/28/2014  . COPD (chronic obstructive pulmonary disease) ([place])   . Dermatitis 11/27/2012  . Diabetes mellitus   . Epicondylitis 06/05/2013   right  . GERD (gastroesophageal reflux disease)   . History of depression   . History of kidney stones   . HIV (human immunodeficiency virus infection) ([place]) 1991   on meds since initial dx.   . Hyperlipidemia   . Hypertension   . Nasal abscess 12/04/2015  . Pancreatitis 11/2013   attributed to HIV meds.     Social History  Substance Use Topics  . Smoking status: Never Smoker  . Smokeless tobacco: Never Used  . Alcohol use 0.0 oz/week      Comment: rare    Family History  Problem Relation Age of Onset  . Hyperlipidemia Mother   . Diabetes Mother        paternal grandparents/1 brother  . Hyperlipidemia Father   . Hypertension Father        paternal grandmother/3 brothers/1 sister  . Arthritis Unknown        mother/father/paternal grandparents  . Breast cancer Maternal Aunt        paternal aunt  . Lung cancer Maternal Aunt   . Heart disease Unknown        parents/maternal grandparents/ 2 brothers  . Stroke Paternal Grandmother   . Mental retardation Sister     No Known Allergies  Objective:  Vitals:   01/20/17 0952  BP: 103/65  Pulse: 66  Temp: 98.1 F (36.7 C)  TempSrc: Oral  Weight: 133 lb (60.3 kg)  Height: 5\' 3"  (1.6 m)   Body mass index is 23.56 kg/m.  Physical Exam  Constitutional: He is oriented to person, place, and time.  He is in good spirits as usual.  HENT:  Mouth/Throat: No oropharyngeal exudate.  Eyes: Conjunctivae are normal.  Cardiovascular: Normal rate and regular rhythm.   No murmur heard. Pulmonary/Chest: Effort normal and breath sounds normal. He has no wheezes. He has no rales.  Abdominal: Soft. He exhibits no mass. There is no tenderness.  Musculoskeletal: Normal range of motion.  Neurological: He is alert and oriented to person, place, and time. Gait normal.  Skin: No rash noted.  Psychiatric: Mood and affect normal.    Lab Results Lab Results  Component Value Date   WBC 4.1 01/05/2017   HGB 11.3 (L) 01/05/2017   HCT 34.2 (L) 01/05/2017   MCV 96.9 01/05/2017   PLT 203 01/05/2017    Lab Results  Component Value Date   CREATININE 1.45 (H) 01/05/2017   BUN 26 (H) 01/05/2017   NA  137 01/05/2017   K 5.0 01/05/2017   CL 108 01/05/2017   CO2 18 (L) 01/05/2017    Lab Results  Component Value Date   ALT 19 01/05/2017   AST 19 01/05/2017   ALKPHOS 55 01/05/2017   BILITOT 0.5 01/05/2017    Lab Results  Component Value Date   CHOL 122 01/05/2017   HDL 30 (L)  01/05/2017   LDLCALC 55 01/05/2017   LDLDIRECT 72.0 11/04/2016   TRIG 183 (H) 01/05/2017   CHOLHDL 4.1 01/05/2017   HIV 1 RNA Quant (copies/mL)  Date Value  01/05/2017 <20 DETECTED (A)  04/01/2016 <20  10/15/2015 <20   HIV-1 RNA Viral Load (no units)  Date Value  07/14/2013 <40  04/13/2013 <40  01/18/2013 <40   CD4 (no units)  Date Value  07/14/2013 514  04/13/2013 498  01/18/2013 522   CD4 T Cell Abs (/uL)  Date Value  01/05/2017 370 (L)  04/01/2016 500  10/15/2015 410     Problem List Items Addressed This Visit      High   Human immunodeficiency virus (HIV) disease ([place])    His infection remains under excellent, long-term control. He will continue his current antiretroviral regimen and follow-up after lab work in 6 months.      Relevant Orders   T-helper cell (CD4)- ([facility] only)   HIV 1 RNA quant-no reflex-bld     Unprioritized   Essential hypertension    He will continue to monitor his blood pressure at home on a regular basis. I suggested that he speak with his cardiologist, Dr. Stanford Breed, if his blood pressure continues to run lower than normal.           Michel Bickers, [place] [facility] [phone] pager   [phone] cell 01/20/2017, 10:16 AM

## 2017-01-20 NOTE — Assessment & Plan Note (Signed)
His infection remains under excellent, long-term control. He will continue his current antiretroviral regimen and follow-up after lab work in 6 months. 

## 2017-01-22 ENCOUNTER — Encounter (HOSPITAL_COMMUNITY): Payer: Self-pay

## 2017-01-22 NOTE — Progress Notes (Signed)
*  Final Notice* I have mailed the patient a letter with information about cardiac rehab. Patient has been called 2X. If patient does not respond to letter, referral will be canceled.

## 2017-01-29 ENCOUNTER — Ambulatory Visit (INDEPENDENT_AMBULATORY_CARE_PROVIDER_SITE_OTHER): Payer: Medicare Other | Admitting: Podiatry

## 2017-01-29 ENCOUNTER — Encounter: Payer: Self-pay | Admitting: Podiatry

## 2017-01-29 VITALS — BP 125/63 | HR 63

## 2017-01-29 DIAGNOSIS — L600 Ingrowing nail: Secondary | ICD-10-CM | POA: Diagnosis not present

## 2017-01-29 DIAGNOSIS — I2510 Atherosclerotic heart disease of native coronary artery without angina pectoris: Secondary | ICD-10-CM

## 2017-01-29 DIAGNOSIS — Z9861 Coronary angioplasty status: Secondary | ICD-10-CM | POA: Diagnosis not present

## 2017-01-29 DIAGNOSIS — M79672 Pain in left foot: Secondary | ICD-10-CM

## 2017-01-29 NOTE — Progress Notes (Signed)
SUBJECTIVE: 71 y.o. year old male presents complaining of abnormal nail growth on left great toe nail.  OBJECTIVE:  DERMATOLOGIC EXAMINATION: Post surgical trauma to nail plate left great toe nail. Normal nail growth on proximal 1/2 plate.  VASCULAR EXAMINATION OF LOWER LIMBS: Pedal pulses are not palpable on both feet.  NEUROLOGIC EXAMINATION OF THE LOWER LIMBS: All epicritic and tactile sensations grossly intact.   MUSCULOSKELETAL EXAMINATION: Positive of plantar flexed 5th met left foot.  Tight Achilles tendon bilateral.  ASSESSMENT: Abnormal nail secondary to surgical trauma left great toe nail. PVD   PLAN: Reviewed clinical findings and encouraged to get back to walking exercise and do daily stretch exercise for tight calf muscle. All nails debrided. Return in 6 months or sooner if needed.

## 2017-01-29 NOTE — Patient Instructions (Signed)
Seen for follow up on left great toe nail. New nail plate is growing out normal. All nails debrided. Return in 6 months or as needed.

## 2017-01-30 ENCOUNTER — Other Ambulatory Visit: Payer: Self-pay | Admitting: Family Medicine

## 2017-02-03 ENCOUNTER — Other Ambulatory Visit: Payer: Self-pay | Admitting: Adult Health

## 2017-02-03 ENCOUNTER — Encounter: Payer: Self-pay | Admitting: Adult Health

## 2017-02-03 ENCOUNTER — Ambulatory Visit (INDEPENDENT_AMBULATORY_CARE_PROVIDER_SITE_OTHER): Payer: Medicare Other | Admitting: Adult Health

## 2017-02-03 VITALS — BP 118/62 | HR 87 | Temp 98.3°F | Ht 63.0 in | Wt 134.8 lb

## 2017-02-03 DIAGNOSIS — J0140 Acute pansinusitis, unspecified: Secondary | ICD-10-CM

## 2017-02-03 DIAGNOSIS — Z9861 Coronary angioplasty status: Secondary | ICD-10-CM | POA: Diagnosis not present

## 2017-02-03 DIAGNOSIS — I2510 Atherosclerotic heart disease of native coronary artery without angina pectoris: Secondary | ICD-10-CM

## 2017-02-03 MED ORDER — DOXYCYCLINE HYCLATE 100 MG PO CAPS: 100.0000 mg | ORAL_CAPSULE | Freq: Two times a day (BID) | ORAL | 0 refills | Status: DC

## 2017-02-03 MED ORDER — FLUTICASONE PROPIONATE 50 MCG/ACT NA SUSP: 2.0000 | Freq: Every day | NASAL | 6 refills | Status: DC

## 2017-02-03 NOTE — Patient Instructions (Signed)
WE NOW OFFER   [facility] FAST TRACK!!!  SAME DAY Appointments for ACUTE CARE  Such as: Sprains, Injuries, cuts, abrasions, rashes, muscle pain, joint pain, back pain Colds, flu, sore throats, headache, allergies, cough, fever  Ear pain, sinus and eye infections Abdominal pain, nausea, vomiting, diarrhea, upset stomach Animal/insect bites  3 Easy Ways to Schedule: Walk-In Scheduling Call in scheduling Mychart Sign-up: [email or web address]         

## 2017-02-03 NOTE — Progress Notes (Signed)
Subjective:    Patient ID: [name], male    DOB: Nov 26, 1945, 71 y.o.   MRN: [id number]  URI   This is a new problem. The current episode started in the past 7 days. The problem has been gradually worsening. There has been no fever. Associated symptoms include diarrhea, ear pain, a plugged ear sensation, rhinorrhea, sinus pain and a sore throat. Pertinent negatives include no coughing (productive ), vomiting or wheezing. He has tried nothing for the symptoms. The treatment provided no relief.     Review of Systems  Constitutional: Positive for activity change and fatigue. Negative for appetite change and fever.  HENT: Positive for ear pain, postnasal drip, rhinorrhea, sinus pain, sinus pressure and sore throat.   Respiratory: Negative for cough (productive ), chest tightness and wheezing.   Cardiovascular: Negative.   Gastrointestinal: Positive for diarrhea. Negative for vomiting.  Neurological: Negative.    Past Medical History:  Diagnosis Date  . Arthritis   . CAD (coronary artery disease)    a. s/p CABG in 2003 b. s/p PCI to SVG-PDA in 07/2015 c. 11/2016: cath showing severe native CAD with patent LIMA-LAD and SVG-D1 with 80% stenosis of SVG-OM1-OM2. Initially medical management was recommended --> presented with recurrent angina --> s/p Synergy DES to proximal body of SVG-OM1-OM2, POBA to distal graft.   . Chronic kidney disease   . Constipation 05/28/2014  . COPD (chronic obstructive pulmonary disease) ([place])   . Dermatitis 11/27/2012  . Diabetes mellitus   . Epicondylitis 06/05/2013   right  . GERD (gastroesophageal reflux disease)   . History of depression   . History of kidney stones   . HIV (human immunodeficiency virus infection) ([place]) 1991   on meds since initial dx.   . Hyperlipidemia   . Hypertension   . Nasal abscess 12/04/2015  . Pancreatitis 11/2013   attributed to HIV meds.     Social History   Social History  . Marital status: Divorced    Spouse name:  N/A  . Number of children: 4  . Years of education: N/A   Occupational History  . 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    Social History Main Topics  . Smoking status: Never Smoker  . Smokeless tobacco: Never Used  . Alcohol use 0.0 oz/week     Comment: rare  . Drug use: No  . Sexual activity: Not Currently     Comment: declined condoms   Other Topics Concern  . Not on file   Social History Narrative   Lives alone.  Supportive friends and family.  His HIV Dx is not a secret.     Past Surgical History:  Procedure Laterality Date  . CARDIAC CATHETERIZATION N/A 07/29/2015   Procedure: Left Heart Cath and Coronary Angiography;  Surgeon: [name], MD;  Location: [facility];  Service: Cardiovascular;  Laterality: N/A;  . CARDIAC CATHETERIZATION N/A 07/29/2015   Procedure: Coronary Stent Intervention;  Surgeon: [name], MD;  Location: [facility];  Service: Cardiovascular;  Laterality: N/A;  . CORONARY ARTERY BYPASS GRAFT  05/2002  . CORONARY STENT INTERVENTION N/A 12/01/2016   Procedure: Coronary Stent Intervention;  Surgeon: [name], MD;  Location: [facility];  Service: Cardiovascular;  Laterality: N/A;  . LEFT HEART CATH AND CORS/GRAFTS ANGIOGRAPHY N/A 11/24/2016   Procedure: Left Heart Cath and Cors/Grafts Angiography;  Surgeon: Nelva Bush, MD;  Location:  [facility];  Service: Cardiovascular;  Laterality: N/A;  . LEFT HEART CATH AND CORS/GRAFTS ANGIOGRAPHY N/A 12/01/2016   Procedure: Left Heart Cath and Cors/Grafts Angiography;  Surgeon: [name], MD;  Location: [facility];  Service: Cardiovascular;  Laterality: N/A;  . VASECTOMY      Family History  Problem Relation Age of Onset  . Hyperlipidemia Mother   . Diabetes Mother        paternal grandparents/1 brother  . Hyperlipidemia Father   . Hypertension Father        paternal grandmother/3 brothers/1 sister  .  Arthritis Unknown        mother/father/paternal grandparents  . Breast cancer Maternal Aunt        paternal aunt  . Lung cancer Maternal Aunt   . Heart disease Unknown        parents/maternal grandparents/ 2 brothers  . Stroke Paternal Grandmother   . Mental retardation Sister     No Known Allergies  Current Outpatient Prescriptions on File Prior to Visit  Medication Sig Dispense Refill  . acetaminophen (TYLENOL) 325 MG tablet Take 2 tablets (650 mg total) by mouth every 4 (four) hours as needed for headache or mild pain.    Marland Kitchen aspirin 81 MG chewable tablet Chew 81 mg by mouth daily.    Marland Kitchen atorvastatin (LIPITOR) 40 MG tablet Take 1 tablet (40 mg total) by mouth daily at 6 PM. 90 tablet 3  . b complex vitamins tablet Take 1 tablet by mouth daily. 30 tablet 11  . B-D UF III MINI PEN NEEDLES 31G X 5 MM MISC USE TWICE DAILY AS DIRECTED 100 each 0  . Cholecalciferol 2000 units CAPS Take 1 capsule (2,000 Units total) by mouth daily. 30 each 11  . diclofenac (VOLTAREN) 50 MG EC tablet TAKE 1 TABLET(50 MG) BY MOUTH TWICE DAILY 180 tablet 0  . furosemide (LASIX) 20 MG tablet TAKE 2 TABLETS BY MOUTH DAILY AS NEEDED FOR FLUID RETENTION OR SWELLING 60 tablet 0  . glucose blood (FREESTYLE TEST STRIPS) test strip Use three times daily to check blood sugar.  DX E11.9 100 each 6  . isosorbide mononitrate (IMDUR) 30 MG 24 hr tablet Take 1 tablet (30 mg total) by mouth daily. 30 tablet 5  . Lancets (FREESTYLE) lancets Use as directed three times a day to check blood sugar.  DX E11.9 100 each 11  . lisinopril (PRINIVIL,ZESTRIL) 2.5 MG tablet Take 1 tablet (2.5 mg total) by mouth daily.    . metFORMIN (GLUCOPHAGE-XR) 500 MG 24 hr tablet Take 2 tablets (1,000 mg total) by mouth daily with supper. (Patient taking differently: Take 500 mg by mouth daily with supper. ) 60 tablet 3  . metoprolol tartrate (LOPRESSOR) 25 MG tablet Take 0.5 tablets (12.5 mg total) by mouth 2 (two) times daily. 60 tablet 5  .  neomycin-polymyxin-hydrocortisone (CORTISPORIN) otic solution INSTILL 3 DROPS IN BOTH EARS THREE TIMES DAILY 10 mL 0  . nitroGLYCERIN (NITROSTAT) 0.4 MG SL tablet Place 1 tablet (0.4 mg total) under the tongue every 5 (five) minutes as needed for chest pain. 25 tablet 3  . NORVIR 100 MG TABS tablet TAKE 1 TABLET(100 MG) BY MOUTH DAILY 30 tablet 11  . omega-3 acid ethyl esters (LOVAZA) 1 g capsule Take 1 capsule (1 g total) by mouth 2 (two) times daily. 120 capsule 2  . Omega-3 Fatty Acids (FISH OIL) 1000 MG CAPS Take by mouth. Takes 2 daily    .  ondansetron (ZOFRAN) 4 MG tablet Take 1 tablet (4 mg total) by mouth every 8 (eight) hours as needed for nausea or vomiting. 20 tablet 0  . pantoprazole (PROTONIX) 40 MG tablet Take 1 tablet (40 mg total) by mouth daily. 30 tablet 5  . pioglitazone (ACTOS) 15 MG tablet TAKE 1 TABLET(15 MG) BY MOUTH DAILY 30 tablet 2  . potassium chloride SA (K-DUR,KLOR-CON) 20 MEQ tablet TAKE 1 TABLET(20 MEQ) BY MOUTH THREE TIMES DAILY 180 tablet 3  . PREZISTA 800 MG tablet TAKE 1 TABLET(800 MG) BY MOUTH DAILY 30 tablet 11  . Probiotic Product (PROBIOTIC DAILY) CAPS Take 1 by mouth daily 30 capsule 11  . repaglinide (PRANDIN) 2 MG tablet TAKE 2 TABLETS BY MOUTH BEFORE BREAKFAST, 1 TABLET BY MOUTH BEFORE LUNCH AND 2 TABLETS BY MOUTH BEFORE SUPPER 450 tablet 0  . rilpivirine (EDURANT) 25 MG TABS tablet Take 1 tablet (25 mg total) by mouth daily with breakfast. 30 tablet 5  . sodium bicarbonate 650 MG tablet Take 650 mg by mouth 3 (three) times daily.    . ticagrelor (BRILINTA) 90 MG TABS tablet Take 1 tablet (90 mg total) by mouth 2 (two) times daily. 180 tablet 3  . TIVICAY 50 MG tablet TAKE 1 TABLET BY MOUTH DAILY 30 tablet 5  . triamcinolone cream (KENALOG) 0.1 % APP TO DRY ITCHY SKIN BID PRN  2  . valACYclovir (VALTREX) 500 MG tablet TAKE 1 TABLET BY MOUTH DAILY 30 tablet 5   No current facility-administered medications on file prior to visit.     BP 118/62 (BP  Location: Left Arm, Patient Position: Sitting, Cuff Size: Normal)   Pulse 87   Temp 98.3 F (36.8 C) (Oral)   Ht 5\' 3"  (1.6 m)   Wt 134 lb 12.8 oz (61.1 kg)   SpO2 98%   BMI 23.88 kg/m       Objective:   Physical Exam  Constitutional: He is oriented to person, place, and time. He appears well-developed and well-nourished. No distress.  HENT:  Right Ear: Hearing, tympanic membrane, external ear and ear canal normal.  Left Ear: Hearing, tympanic membrane, external ear and ear canal normal.  Nose: Mucosal edema and rhinorrhea present. Right sinus exhibits maxillary sinus tenderness and frontal sinus tenderness. Left sinus exhibits maxillary sinus tenderness and frontal sinus tenderness.  Mouth/Throat: Oropharynx is clear and moist and mucous membranes are normal.  + PND   Cardiovascular: Normal rate, regular rhythm, normal heart sounds and intact distal pulses.  Exam reveals no gallop and no friction rub.   No murmur heard. Pulmonary/Chest: Effort normal and breath sounds normal. No respiratory distress. He has no wheezes. He has no rales. He exhibits no tenderness.  Neurological: He is alert and oriented to person, place, and time.  Skin: Skin is warm and dry. No rash noted. He is not diaphoretic. No erythema. No pallor.  Psychiatric: He has a normal mood and affect. His behavior is normal. Judgment and thought content normal.  Nursing note and vitals reviewed.     Assessment & Plan:  1. Acute pansinusitis, recurrence not specified - doxycycline (VIBRAMYCIN) 100 MG capsule; Take 1 capsule (100 mg total) by mouth 2 (two) times daily.  Dispense: 14 capsule; Refill: 0 - fluticasone (FLONASE) 50 MCG/ACT nasal spray; Place 2 sprays into both nostrils daily.  Dispense: 16 g; Refill: 6 - Follow up if no improvement   Dorothyann Peng, NP

## 2017-02-05 ENCOUNTER — Other Ambulatory Visit: Payer: Self-pay | Admitting: Family Medicine

## 2017-02-05 NOTE — Telephone Encounter (Signed)
Ok to refill 

## 2017-02-05 NOTE — Telephone Encounter (Signed)
Ok to refill for 90 days  

## 2017-02-06 ENCOUNTER — Encounter: Payer: Self-pay | Admitting: Adult Health

## 2017-02-06 MED ORDER — PREDNISONE 10 MG PO TABS: ORAL_TABLET | ORAL | 0 refills | Status: DC

## 2017-02-06 NOTE — Telephone Encounter (Signed)
Spoke to patient and since I last saw him he has developed a non productive cough and shortness of breath. He reports that he has been coughing so much that he has a hard time catching his breath.   Denies any wheezing.   I am going to prescribe a prednisone taper - advised to take with plenty of water due to diabetes

## 2017-02-07 ENCOUNTER — Other Ambulatory Visit: Payer: Self-pay | Admitting: Endocrinology

## 2017-02-16 ENCOUNTER — Telehealth: Payer: Self-pay | Admitting: *Deleted

## 2017-02-16 ENCOUNTER — Other Ambulatory Visit: Payer: Self-pay | Admitting: Family Medicine

## 2017-02-16 NOTE — Telephone Encounter (Signed)
Patient is taking Valtrex daily as prescirbed. no PA needed.

## 2017-02-17 ENCOUNTER — Telehealth: Payer: Self-pay | Admitting: Adult Health

## 2017-02-17 NOTE — Telephone Encounter (Signed)
Error

## 2017-02-18 ENCOUNTER — Ambulatory Visit (INDEPENDENT_AMBULATORY_CARE_PROVIDER_SITE_OTHER): Payer: Medicare Other | Admitting: Adult Health

## 2017-02-18 ENCOUNTER — Encounter: Payer: Self-pay | Admitting: Adult Health

## 2017-02-18 VITALS — BP 90/58 | Temp 98.7°F | Ht 63.0 in | Wt 133.0 lb

## 2017-02-18 DIAGNOSIS — M79632 Pain in left forearm: Secondary | ICD-10-CM

## 2017-02-18 DIAGNOSIS — Z9861 Coronary angioplasty status: Secondary | ICD-10-CM

## 2017-02-18 DIAGNOSIS — I2510 Atherosclerotic heart disease of native coronary artery without angina pectoris: Secondary | ICD-10-CM

## 2017-02-18 NOTE — Progress Notes (Signed)
Subjective:    Patient ID: [name], male    DOB: 06-30-1946, 71 y.o.   MRN: [id number]  HPI  71 year old male who  has a past medical history of Arthritis; CAD (coronary artery disease); Chronic kidney disease; Constipation (05/28/2014); COPD (chronic obstructive pulmonary disease) ([place]); Dermatitis (11/27/2012); Diabetes mellitus; Epicondylitis (06/05/2013); GERD (gastroesophageal reflux disease); History of depression; History of kidney stones; HIV (human immunodeficiency virus infection) ([place]) (1991); Hyperlipidemia; Hypertension; Nasal abscess (12/04/2015); and Pancreatitis (11/2013).   He presents to the office for 24 hours of left arm pain. He reports that he was doing arm strengthening exercises with an ball when he felt a pain in his left forearm. He also reports welling and decreased grip strength. Over the last 24 hours the swelling and started to subside and pain has decreased. He continues to have loss of grip strength.   Pain is 2/3 out of 10.   Denies taking any medications for pain and has not been icing area  Review of Systems See HPI   Past Medical History:  Diagnosis Date  . Arthritis   . CAD (coronary artery disease)    a. s/p CABG in 2003 b. s/p PCI to SVG-PDA in 07/2015 c. 11/2016: cath showing severe native CAD with patent LIMA-LAD and SVG-D1 with 80% stenosis of SVG-OM1-OM2. Initially medical management was recommended --> presented with recurrent angina --> s/p Synergy DES to proximal body of SVG-OM1-OM2, POBA to distal graft.   . Chronic kidney disease   . Constipation 05/28/2014  . COPD (chronic obstructive pulmonary disease) ([place])   . Dermatitis 11/27/2012  . Diabetes mellitus   . Epicondylitis 06/05/2013   right  . GERD (gastroesophageal reflux disease)   . History of depression   . History of kidney stones   . HIV (human immunodeficiency virus infection) ([place]) 1991   on meds since initial dx.   . Hyperlipidemia   . Hypertension   . Nasal abscess  12/04/2015  . Pancreatitis 11/2013   attributed to HIV meds.     Social History   Social History  . Marital status: Divorced    Spouse name: N/A  . Number of children: 4  . Years of education: N/A   Occupational History  . 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    Social History Main Topics  . Smoking status: Never Smoker  . Smokeless tobacco: Never Used  . Alcohol use 0.0 oz/week     Comment: rare  . Drug use: No  . Sexual activity: Not Currently     Comment: declined condoms   Other Topics Concern  . Not on file   Social History Narrative   Lives alone.  Supportive friends and family.  His HIV Dx is not a secret.     Past Surgical History:  Procedure Laterality Date  . CARDIAC CATHETERIZATION N/A 07/29/2015   Procedure: Left Heart Cath and Coronary Angiography;  Surgeon: [name], MD;  Location: [facility];  Service: Cardiovascular;  Laterality: N/A;  . CARDIAC CATHETERIZATION N/A 07/29/2015   Procedure: Coronary Stent Intervention;  Surgeon: [name], MD;  Location: [facility];  Service: Cardiovascular;  Laterality: N/A;  . CORONARY ARTERY BYPASS GRAFT  05/2002  . CORONARY STENT INTERVENTION N/A 12/01/2016   Procedure: Coronary Stent Intervention;  Surgeon: [name], MD;  Location: [facility];  Service: Cardiovascular;  Laterality: N/A;  . LEFT  HEART CATH AND CORS/GRAFTS ANGIOGRAPHY N/A 11/24/2016   Procedure: Left Heart Cath and Cors/Grafts Angiography;  Surgeon: Nelva Bush, MD;  Location: [facility];  Service: Cardiovascular;  Laterality: N/A;  . LEFT HEART CATH AND CORS/GRAFTS ANGIOGRAPHY N/A 12/01/2016   Procedure: Left Heart Cath and Cors/Grafts Angiography;  Surgeon: [name], MD;  Location: [facility];  Service: Cardiovascular;  Laterality: N/A;  . VASECTOMY      Family History  Problem Relation Age of Onset  . Hyperlipidemia Mother   . Diabetes Mother         paternal grandparents/1 brother  . Hyperlipidemia Father   . Hypertension Father        paternal grandmother/3 brothers/1 sister  . Arthritis Unknown        mother/father/paternal grandparents  . Breast cancer Maternal Aunt        paternal aunt  . Lung cancer Maternal Aunt   . Heart disease Unknown        parents/maternal grandparents/ 2 brothers  . Stroke Paternal Grandmother   . Mental retardation Sister     No Known Allergies  Current Outpatient Prescriptions on File Prior to Visit  Medication Sig Dispense Refill  . acetaminophen (TYLENOL) 325 MG tablet Take 2 tablets (650 mg total) by mouth every 4 (four) hours as needed for headache or mild pain.    Marland Kitchen aspirin 81 MG chewable tablet Chew 81 mg by mouth daily.    Marland Kitchen atorvastatin (LIPITOR) 40 MG tablet Take 1 tablet (40 mg total) by mouth daily at 6 PM. 90 tablet 3  . b complex vitamins tablet Take 1 tablet by mouth daily. 30 tablet 11  . B-D UF III MINI PEN NEEDLES 31G X 5 MM MISC USE TWICE DAILY AS DIRECTED 100 each 0  . Cholecalciferol 2000 units CAPS Take 1 capsule (2,000 Units total) by mouth daily. 30 each 11  . diclofenac (VOLTAREN) 50 MG EC tablet TAKE 1 TABLET(50 MG) BY MOUTH TWICE DAILY 180 tablet 0  . fluticasone (FLONASE) 50 MCG/ACT nasal spray Place 2 sprays into both nostrils daily. 16 g 6  . furosemide (LASIX) 20 MG tablet TAKE 2 TABLETS BY MOUTH DAILY AS NEEDED FOR FLUID RETENTION OR SWELLING 90 tablet 0  . glucose blood (FREESTYLE TEST STRIPS) test strip Use three times daily to check blood sugar.  DX E11.9 100 each 6  . isosorbide mononitrate (IMDUR) 30 MG 24 hr tablet Take 1 tablet (30 mg total) by mouth daily. 30 tablet 5  . Lancets (FREESTYLE) lancets Use as directed three times a day to check blood sugar.  DX E11.9 100 each 11  . lisinopril (PRINIVIL,ZESTRIL) 2.5 MG tablet Take 1 tablet (2.5 mg total) by mouth daily.    . metFORMIN (GLUCOPHAGE-XR) 500 MG 24 hr tablet Take 2 tablets (1,000 mg total) by  mouth daily with supper. (Patient taking differently: Take 500 mg by mouth daily with supper. ) 60 tablet 3  . metoprolol tartrate (LOPRESSOR) 25 MG tablet Take 0.5 tablets (12.5 mg total) by mouth 2 (two) times daily. 60 tablet 5  . neomycin-polymyxin-hydrocortisone (CORTISPORIN) otic solution INSTILL 3 DROPS IN BOTH EARS THREE TIMES DAILY 10 mL 0  . nitroGLYCERIN (NITROSTAT) 0.4 MG SL tablet Place 1 tablet (0.4 mg total) under the tongue every 5 (five) minutes as needed for chest pain. 25 tablet 3  . NORVIR 100 MG TABS tablet TAKE 1 TABLET(100 MG) BY MOUTH DAILY 30 tablet 11  . Omega-3 Fatty  Acids (FISH OIL) 1000 MG CAPS Take by mouth. Takes 2 daily    . ondansetron (ZOFRAN) 4 MG tablet Take 1 tablet (4 mg total) by mouth every 8 (eight) hours as needed for nausea or vomiting. 20 tablet 0  . pantoprazole (PROTONIX) 40 MG tablet Take 1 tablet (40 mg total) by mouth daily. 30 tablet 5  . pioglitazone (ACTOS) 15 MG tablet TAKE 1 TABLET(15 MG) BY MOUTH DAILY 30 tablet 0  . potassium chloride SA (K-DUR,KLOR-CON) 20 MEQ tablet TAKE 1 TABLET(20 MEQ) BY MOUTH THREE TIMES DAILY 180 tablet 3  . PREZISTA 800 MG tablet TAKE 1 TABLET(800 MG) BY MOUTH DAILY 30 tablet 11  . Probiotic Product (PROBIOTIC DAILY) CAPS Take 1 by mouth daily 30 capsule 11  . repaglinide (PRANDIN) 2 MG tablet TAKE 2 TABLETS BY MOUTH BEFORE BREAKFAST, 1 TABLET BY MOUTH BEFORE LUNCH AND 2 TABLETS BY MOUTH BEFORE SUPPER 450 tablet 0  . rilpivirine (EDURANT) 25 MG TABS tablet Take 1 tablet (25 mg total) by mouth daily with breakfast. 30 tablet 5  . sodium bicarbonate 650 MG tablet Take 650 mg by mouth 3 (three) times daily.    . ticagrelor (BRILINTA) 90 MG TABS tablet Take 1 tablet (90 mg total) by mouth 2 (two) times daily. 180 tablet 3  . TIVICAY 50 MG tablet TAKE 1 TABLET BY MOUTH DAILY 30 tablet 5  . triamcinolone cream (KENALOG) 0.1 % APP TO DRY ITCHY SKIN BID PRN  2  . valACYclovir (VALTREX) 500 MG tablet TAKE 1 TABLET BY MOUTH  DAILY 30 tablet 5   No current facility-administered medications on file prior to visit.     BP (!) 90/58 (BP Location: Right Arm)   Temp 98.7 F (37.1 C) (Oral)   Ht 5\' 3"  (1.6 m)   Wt 133 lb (60.3 kg)   BMI 23.56 kg/m       Objective:   Physical Exam  Constitutional: He is oriented to person, place, and time. He appears well-developed and well-nourished. No distress.  Cardiovascular: Normal rate, regular rhythm, normal heart sounds and intact distal pulses.  Exam reveals no gallop and no friction rub.   No murmur heard. Musculoskeletal: Normal range of motion. He exhibits edema and tenderness. He exhibits no deformity.  Trace edema to left forearm. Slight pain with palpation. No masses, lumps, or abscess noted. Skin is not warm or red.     Neurological: He is alert and oriented to person, place, and time.  Skin: Skin is warm and dry. No rash noted. He is not diaphoretic. No erythema. No pallor.  Psychiatric: He has a normal mood and affect. His behavior is normal. Judgment and thought content normal.  Nursing note and vitals reviewed.     Assessment & Plan:  1. Left forearm pain - appears to be strain injury  - No signs of blood clot  - Advised ice, rest, and motrin  - Follow up if not resolved in 2 -3 days   Dorothyann Peng, NP

## 2017-02-19 ENCOUNTER — Other Ambulatory Visit: Payer: Self-pay | Admitting: Adult Health

## 2017-02-19 NOTE — Telephone Encounter (Signed)
Ok to refill for one year  

## 2017-02-24 ENCOUNTER — Ambulatory Visit: Payer: Medicare Other

## 2017-02-25 ENCOUNTER — Ambulatory Visit: Payer: Medicare Other

## 2017-02-26 ENCOUNTER — Encounter: Payer: Self-pay | Admitting: Internal Medicine

## 2017-03-01 ENCOUNTER — Other Ambulatory Visit: Payer: Self-pay | Admitting: Adult Health

## 2017-03-03 NOTE — Telephone Encounter (Signed)
[name], you have not prescribed this medication.  Please advise.

## 2017-03-04 NOTE — Telephone Encounter (Signed)
It is ok to refill for one year 

## 2017-03-04 NOTE — Telephone Encounter (Signed)
Sent to the pharmacy by e-scribe. 

## 2017-03-15 ENCOUNTER — Other Ambulatory Visit: Payer: Self-pay | Admitting: Endocrinology

## 2017-03-28 ENCOUNTER — Other Ambulatory Visit: Payer: Self-pay | Admitting: Endocrinology

## 2017-03-28 ENCOUNTER — Other Ambulatory Visit: Payer: Self-pay | Admitting: Internal Medicine

## 2017-03-28 ENCOUNTER — Other Ambulatory Visit: Payer: Self-pay | Admitting: Family Medicine

## 2017-03-28 DIAGNOSIS — B20 Human immunodeficiency virus [HIV] disease: Secondary | ICD-10-CM

## 2017-03-31 NOTE — Telephone Encounter (Signed)
Ok to refill for one year  

## 2017-03-31 NOTE — Telephone Encounter (Signed)
Sent to the pharmacy by e-scribe as instructed. 

## 2017-04-08 ENCOUNTER — Other Ambulatory Visit (INDEPENDENT_AMBULATORY_CARE_PROVIDER_SITE_OTHER): Payer: Medicare Other

## 2017-04-08 DIAGNOSIS — E1165 Type 2 diabetes mellitus with hyperglycemia: Secondary | ICD-10-CM

## 2017-04-08 LAB — HEMOGLOBIN A1C: HEMOGLOBIN A1C: 6.3 % (ref 4.6–6.5)

## 2017-04-08 LAB — BASIC METABOLIC PANEL
BUN: 23 mg/dL (ref 6–23)
CO2: 21 mEq/L (ref 19–32)
Calcium: 8.5 mg/dL (ref 8.4–10.5)
Chloride: 108 mEq/L (ref 96–112)
Creatinine, Ser: 1.5 mg/dL (ref 0.40–1.50)
GFR: 49.34 mL/min — AB (ref >60.00)
Glucose, Bld: 90 mg/dL (ref 70–99)
Potassium: 5 mEq/L (ref 3.5–5.1)
SODIUM: 137 meq/L (ref 135–145)

## 2017-04-09 ENCOUNTER — Other Ambulatory Visit: Payer: Medicare Other

## 2017-04-13 ENCOUNTER — Encounter: Payer: Self-pay | Admitting: Endocrinology

## 2017-04-13 ENCOUNTER — Ambulatory Visit (INDEPENDENT_AMBULATORY_CARE_PROVIDER_SITE_OTHER): Payer: Medicare Other | Admitting: Endocrinology

## 2017-04-13 VITALS — BP 124/78 | HR 65 | Ht 63.0 in | Wt 136.0 lb

## 2017-04-13 DIAGNOSIS — I2510 Atherosclerotic heart disease of native coronary artery without angina pectoris: Secondary | ICD-10-CM

## 2017-04-13 DIAGNOSIS — Z9861 Coronary angioplasty status: Secondary | ICD-10-CM

## 2017-04-13 DIAGNOSIS — E119 Type 2 diabetes mellitus without complications: Secondary | ICD-10-CM

## 2017-04-13 NOTE — Patient Instructions (Addendum)
Check blood sugars on waking up  2-3/7 days  Also check blood sugars about 2 hours after a meal and do this after different meals by rotation  Recommended blood sugar levels on waking up is 90-130 and about 2 hours after meal is 130-160  Please bring your blood sugar monitor to each visit, thank you  Take only 2 Potassium daily

## 2017-04-13 NOTE — Progress Notes (Signed)
Patient ID: [name], male   DOB: [birth year], 71 y.o.   MRN: [id number]           Reason for Appointment: Follow-up for Type 2 Diabetes  Referring physician: Charlett Blake  History of Present Illness:          Date of diagnosis of type 2 diabetes mellitus: 2013        Background history:  He only mildly increased blood sugar levels at the time of diagnosis and A1c was 7.1  He had been treated initially with metformin but this was stopped subsequently when his renal function was worse  He was subsequently treated with glipizide but his blood sugars had been poorly controlled in 2016 with glipizide alone Since his A1c has been persistently over 9% he was started on Januvia 50 mg in 01/2015 At initial consultation he had a high A1c of 9.5; he was switched from glipizide to Prandin in 02/2015 Previously when he had high fasting readings he was started on Toujeo on 06/11/16, however this was stopped in April 2018 when he was getting hypoglycemia  Recent history:    Oral hypoglycemic drugs: Prandin 4 mg at breakfast  and 2 mg before lunch and supper, Actos 15 mg daily, metformin ER 500 mg daily   His A1c is now 6.3, highest this year was 7.6  Current blood sugar patterns and problems identified:  He did not bring his monitor  However he thinks his blood sugars are fairly good even after meals  He is not checking readings after his evening meals  Although his weight is going up slightly he is not seeing any consistently high readings He tends to snack more later at night Trying to take Prandin before eating and usually is compliant with this  Side effects from medications have been: None  Compliance with the medical regimen: Good, usually trying to take his Prandin before eating   Glucose monitoring:  done usually 0-1  times a day         Glucometer:  FreeStyle     Blood Glucose readings by recall:  PRE-MEAL Fasting Lunch Dinner Bedtime Overall  Glucose range: 90-100        Mean/median:        POST-MEAL PC Breakfast PC Lunch PC Dinner  Glucose range:  130-140 ?  Mean/median:       Dietician visit, most recent: 8/15 DIET:  has been trying to reduce fried/usually not eating high fat  foods; eating oatmeal in the morning With yogurt               Dinner 6 pm Exercise:  walking recently 1 mile, 5/7 days  .  Weight history:   Wt Readings from Last 3 Encounters:  04/13/17 136 lb (61.7 kg)  02/18/17 133 lb (60.3 kg)  02/03/17 134 lb 12.8 oz (61.1 kg)    Glycemic control:   Lab Results  Component Value Date   HGBA1C 6.3 04/08/2017   HGBA1C 5.9 01/01/2017   HGBA1C 6.3 (H) 11/23/2016   Lab Results  Component Value Date   MICROALBUR 21.2 (H) 06/06/2016   LDLCALC 55 01/05/2017   CREATININE 1.50 04/08/2017    Other active problems: See review of systems   Lab on 04/08/2017  Component Date Value Ref Range Status  . Hgb A1c MFr Bld 04/08/2017 6.3  4.6 - 6.5 % Final   Glycemic Control Guidelines for People with Diabetes:Non Diabetic:  <6%Goal of Therapy: <7%Additional Action Suggested:  >8%   .  Sodium 04/08/2017 137  135 - 145 mEq/L Final  . Potassium 04/08/2017 5.0  3.5 - 5.1 mEq/L Final  . Chloride 04/08/2017 108  96 - 112 mEq/L Final  . CO2 04/08/2017 21  19 - 32 mEq/L Final  . Glucose, Bld 04/08/2017 90  70 - 99 mg/dL Final  . BUN 04/08/2017 23  6 - 23 mg/dL Final  . Creatinine, Ser 04/08/2017 1.50  0.40 - 1.50 mg/dL Final  . Calcium 04/08/2017 8.5  8.4 - 10.5 mg/dL Final  . GFR 04/08/2017 49.34* >60.00 mL/min Final       Allergies as of 04/13/2017   No Known Allergies     Medication List       Accurate as of 04/13/17 10:36 AM. Always use your most recent med list.          acetaminophen 325 MG tablet Commonly known as:  TYLENOL Take 2 tablets (650 mg total) by mouth every 4 (four) hours as needed for headache or mild pain.   aspirin 81 MG chewable tablet Chew 81 mg by mouth daily.   atorvastatin 40 MG tablet Commonly  known as:  LIPITOR Take 1 tablet (40 mg total) by mouth daily at 6 PM.   b complex vitamins tablet Take 1 tablet by mouth daily.   B-D UF III MINI PEN NEEDLES 31G X 5 MM Misc Generic drug:  Insulin Pen Needle USE TWICE DAILY AS DIRECTED   Cholecalciferol 2000 units Caps Take 1 capsule (2,000 Units total) by mouth daily.   diclofenac 50 MG EC tablet Commonly known as:  VOLTAREN TAKE 1 TABLET(50 MG) BY MOUTH TWICE DAILY   Fish Oil 1000 MG Caps Take by mouth. Takes 2 daily   fluticasone 50 MCG/ACT nasal spray Commonly known as:  FLONASE Place 2 sprays into both nostrils daily.   freestyle lancets Use as directed three times a day to check blood sugar.  DX E11.9   FREESTYLE TEST STRIPS test strip Generic drug:  glucose blood USE TO CHECK BLOOD SUGAR THREE TIMES DAILY   furosemide 20 MG tablet Commonly known as:  LASIX TAKE 2 TABLETS BY MOUTH DAILY AS NEEDED FOR FLUID RETENTION OR SWELLING   isosorbide mononitrate 30 MG 24 hr tablet Commonly known as:  IMDUR Take 1 tablet (30 mg total) by mouth daily.   lisinopril 5 MG tablet Commonly known as:  PRINIVIL,ZESTRIL TAKE 1/2 TABLET BY MOUTH DAILY   metFORMIN 500 MG 24 hr tablet Commonly known as:  GLUCOPHAGE-XR Take 2 tablets (1,000 mg total) by mouth daily with supper.   metoprolol tartrate 25 MG tablet Commonly known as:  LOPRESSOR Take 0.5 tablets (12.5 mg total) by mouth 2 (two) times daily.   neomycin-polymyxin-hydrocortisone OTIC solution Commonly known as:  CORTISPORIN INSTILL 3 DROPS IN BOTH EARS THREE TIMES DAILY   nitroGLYCERIN 0.4 MG SL tablet Commonly known as:  NITROSTAT Place 1 tablet (0.4 mg total) under the tongue every 5 (five) minutes as needed for chest pain.   NORVIR 100 MG Tabs tablet Generic drug:  ritonavir TAKE 1 TABLET(100 MG) BY MOUTH DAILY   ondansetron 4 MG tablet Commonly known as:  ZOFRAN Take 1 tablet (4 mg total) by mouth every 8 (eight) hours as needed for nausea or vomiting.     pantoprazole 40 MG tablet Commonly known as:  PROTONIX Take 1 tablet (40 mg total) by mouth daily.   pioglitazone 15 MG tablet Commonly known as:  ACTOS TAKE 1 TABLET(15 MG) BY MOUTH DAILY  potassium chloride SA 20 MEQ tablet Commonly known as:  K-DUR,KLOR-CON TAKE 1 TABLET(20 MEQ) BY MOUTH THREE TIMES DAILY   PREZISTA 800 MG tablet Generic drug:  darunavir TAKE 1 TABLET(800 MG) BY MOUTH DAILY   PROBIOTIC DAILY Caps Take 1 by mouth daily   repaglinide 2 MG tablet Commonly known as:  PRANDIN TAKE 2 TABLETS BY MOUTH BEFORE BREAKFAST, 1 TABLET BY MOUTH BEFORE LUNCH AND 2 TABLETS BY MOUTH BEFORE SUPPER   rilpivirine 25 MG Tabs tablet Commonly known as:  EDURANT Take 1 tablet (25 mg total) by mouth daily with breakfast.   sodium bicarbonate 650 MG tablet Take 650 mg by mouth 3 (three) times daily.   ticagrelor 90 MG Tabs tablet Commonly known as:  BRILINTA Take 1 tablet (90 mg total) by mouth 2 (two) times daily.   TIVICAY 50 MG tablet Generic drug:  dolutegravir TAKE 1 TABLET BY MOUTH DAILY   triamcinolone cream 0.1 % Commonly known as:  KENALOG APP TO DRY ITCHY SKIN BID PRN   valACYclovir 500 MG tablet Commonly known as:  VALTREX TAKE 1 TABLET BY MOUTH DAILY       Allergies: No Known Allergies  Past Medical History:  Diagnosis Date  . Arthritis   . CAD (coronary artery disease)    a. s/p CABG in 2003 b. s/p PCI to SVG-PDA in 07/2015 c. 11/2016: cath showing severe native CAD with patent LIMA-LAD and SVG-D1 with 80% stenosis of SVG-OM1-OM2. Initially medical management was recommended --> presented with recurrent angina --> s/p Synergy DES to proximal body of SVG-OM1-OM2, POBA to distal graft.   . Chronic kidney disease   . Constipation 05/28/2014  . COPD (chronic obstructive pulmonary disease) ([place])   . Dermatitis 11/27/2012  . Diabetes mellitus   . Epicondylitis 06/05/2013   right  . GERD (gastroesophageal reflux disease)   . History of depression   .  History of kidney stones   . HIV (human immunodeficiency virus infection) ([place]) 1991   on meds since initial dx.   . Hyperlipidemia   . Hypertension   . Nasal abscess 12/04/2015  . Pancreatitis 11/2013   attributed to HIV meds.     Past Surgical History:  Procedure Laterality Date  . CARDIAC CATHETERIZATION N/A 07/29/2015   Procedure: Left Heart Cath and Coronary Angiography;  Surgeon: [name], MD;  Location: [facility];  Service: Cardiovascular;  Laterality: N/A;  . CARDIAC CATHETERIZATION N/A 07/29/2015   Procedure: Coronary Stent Intervention;  Surgeon: [name], MD;  Location: [facility];  Service: Cardiovascular;  Laterality: N/A;  . CORONARY ARTERY BYPASS GRAFT  05/2002  . CORONARY STENT INTERVENTION N/A 12/01/2016   Procedure: Coronary Stent Intervention;  Surgeon: [name], MD;  Location: [facility];  Service: Cardiovascular;  Laterality: N/A;  . LEFT HEART CATH AND CORS/GRAFTS ANGIOGRAPHY N/A 11/24/2016   Procedure: Left Heart Cath and Cors/Grafts Angiography;  Surgeon: Nelva Bush, MD;  Location: [facility];  Service: Cardiovascular;  Laterality: N/A;  . LEFT HEART CATH AND CORS/GRAFTS ANGIOGRAPHY N/A 12/01/2016   Procedure: Left Heart Cath and Cors/Grafts Angiography;  Surgeon: [name], MD;  Location: [facility];  Service: Cardiovascular;  Laterality: N/A;  . VASECTOMY      Family History  Problem Relation Age of Onset  . Hyperlipidemia Mother   . Diabetes Mother        paternal grandparents/1 brother  . Hyperlipidemia Father   . Hypertension  Father        paternal grandmother/3 brothers/1 sister  . Arthritis Unknown        mother/father/paternal grandparents  . Breast cancer Maternal Aunt        paternal aunt  . Lung cancer Maternal Aunt   . Heart disease Unknown        parents/maternal grandparents/ 2 brothers  . Stroke Paternal Grandmother   . Mental retardation Sister     Social History:   reports that he has never smoked. He has never used smokeless tobacco. He reports that he drinks alcohol. He reports that he does not use drugs.    Review of Systems    Lipid history: On Lipitor 40 mg, followed by PCP His triglycerides are Below 200 Fish oil: he is currently on 2 a day   Lab Results  Component Value Date   CHOL 122 01/05/2017   HDL 30 (L) 01/05/2017   LDLCALC 55 01/05/2017   LDLDIRECT 72.0 11/04/2016   TRIG 183 (H) 01/05/2017   CHOLHDL 4.1 01/05/2017            RENAL dysfunction: Followed by nephrologist,This is mild  and stable He has history of edema, Now controlled   Has been told to reduce use of diclofenac, takes rarely   Lab Results  Component Value Date   CREATININE 1.50 04/08/2017   BUN 23 04/08/2017   NA 137 04/08/2017   K 5.0 04/08/2017   CL 108 04/08/2017   CO2 21 04/08/2017     Hypertension: , Recently taking 2.5 mg lisinopril And 25 mg metoprolol    BP Readings from Last 3 Encounters:  04/13/17 124/78  02/18/17 (!) 90/58  02/03/17 118/62    Diabetic foot exam  in 09/2016 shows normal monofilament sensation in the toes and plantar surfaces, no skin lesions or ulcers on the feet and absent pedal pulses    Physical Examination:  BP 124/78   Pulse 65   Ht 5\' 3"  (1.6 m)   Wt 136 lb (61.7 kg)   SpO2 98%   BMI 24.09 kg/m     ASSESSMENT:  Diabetes type 2, uncontrolled with BMI 23; has  abdominal obesity   See history of present illness for detailed discussion of current diabetes management, blood sugar patterns and problems identified  His A1c is 6.3 which is reasonably good He is taking a combination of low-dose metformin which is helping his fasting reading along with Prandin which apparently is keeping his postprandial readings mostly controlled without hypoglycemia However not able to review his meter today No side effects from Actos He is doing better with his walking regimen and usually watching his diet  HIGH  triglycerides: Improved He will continue OTC fish oil and a statin drug prescribed by cardiologist    PLAN:  No change in medication regimen and he can continue to be taking Prandin, usually compliant with taking this before meals Since he has no edema or LV dysfunction he can continue Actos  POTASSIUM supplements: His potassium level is 5.0 and he can reduce the supplements down to 2  Follow-up in 3 months     Patient Instructions  Check blood sugars on waking up  2-3/7 days  Also check blood sugars about 2 hours after a meal and do this after different meals by rotation  Recommended blood sugar levels on waking up is 90-130 and about 2 hours after meal is 130-160  Please bring your blood sugar monitor to each visit, thank you  Take only 2 Potassium daily      Jahaad Penado 04/13/2017, 10:36 AM   Note: This office note was prepared with Estate agent. Any transcriptional errors that result from this process are unintentional.

## 2017-04-24 ENCOUNTER — Encounter: Payer: Self-pay | Admitting: Adult Health

## 2017-05-05 ENCOUNTER — Encounter: Payer: Self-pay | Admitting: Adult Health

## 2017-05-05 ENCOUNTER — Ambulatory Visit (INDEPENDENT_AMBULATORY_CARE_PROVIDER_SITE_OTHER): Payer: Medicare Other | Admitting: Adult Health

## 2017-05-05 ENCOUNTER — Ambulatory Visit (INDEPENDENT_AMBULATORY_CARE_PROVIDER_SITE_OTHER)
Admission: RE | Admit: 2017-05-05 | Discharge: 2017-05-05 | Disposition: A | Discharge status: Home / Self Care | Payer: Medicare Other | Source: Ambulatory Visit | Attending: Adult Health | Admitting: Adult Health

## 2017-05-05 VITALS — BP 104/60 | Temp 98.1°F | Ht 63.0 in | Wt 134.0 lb

## 2017-05-05 DIAGNOSIS — I2510 Atherosclerotic heart disease of native coronary artery without angina pectoris: Secondary | ICD-10-CM

## 2017-05-05 DIAGNOSIS — Z9861 Coronary angioplasty status: Secondary | ICD-10-CM | POA: Diagnosis not present

## 2017-05-05 DIAGNOSIS — R0602 Shortness of breath: Secondary | ICD-10-CM | POA: Diagnosis not present

## 2017-05-05 DIAGNOSIS — R0600 Dyspnea, unspecified: Secondary | ICD-10-CM | POA: Diagnosis not present

## 2017-05-05 NOTE — Progress Notes (Signed)
Subjective:    Patient ID: [name], male    DOB: 03/11/1946, 71 y.o.   MRN: [id number]  HPI  71 year old male who  has a past medical history of Arthritis; CAD (coronary artery disease); Chronic kidney disease; Constipation (05/28/2014); COPD (chronic obstructive pulmonary disease) ([place]); Dermatitis (11/27/2012); Diabetes mellitus; Epicondylitis (06/05/2013); GERD (gastroesophageal reflux disease); History of depression; History of kidney stones; HIV (human immunodeficiency virus infection) ([place]) (1991); Hyperlipidemia; Hypertension; Nasal abscess (12/04/2015); and Pancreatitis (11/2013).   He presents to the office today for the complaint of shortness of breath. He reports that " more often or not, I feel like I cannot get a full breath". He feels as though the the shortness of breath is worse when he is in the shower in the morning. This feeling happens throughout the day and is becoming more frequent. He reports no contributing factors. He is able to walk a mile and  Half without feeling short of breath. Then he could be on the cough watching tv and have episodes of feeling short of breath    Per patient " it feels like I am having a panic attack without the panic attack."   He denies any fevers or feeling ill.   Review of Systems See HPI   Past Medical History:  Diagnosis Date  . Arthritis   . CAD (coronary artery disease)    a. s/p CABG in 2003 b. s/p PCI to SVG-PDA in 07/2015 c. 11/2016: cath showing severe native CAD with patent LIMA-LAD and SVG-D1 with 80% stenosis of SVG-OM1-OM2. Initially medical management was recommended --> presented with recurrent angina --> s/p Synergy DES to proximal body of SVG-OM1-OM2, POBA to distal graft.   . Chronic kidney disease   . Constipation 05/28/2014  . COPD (chronic obstructive pulmonary disease) ([place])   . Dermatitis 11/27/2012  . Diabetes mellitus   . Epicondylitis 06/05/2013   right  . GERD (gastroesophageal reflux disease)   . History  of depression   . History of kidney stones   . HIV (human immunodeficiency virus infection) ([place]) 1991   on meds since initial dx.   . Hyperlipidemia   . Hypertension   . Nasal abscess 12/04/2015  . Pancreatitis 11/2013   attributed to HIV meds.     Social History   Social History  . Marital status: Divorced    Spouse name: N/A  . Number of children: 4  . Years of education: N/A   Occupational History  . 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    Social History Main Topics  . Smoking status: Never Smoker  . Smokeless tobacco: Never Used  . Alcohol use 0.0 oz/week     Comment: rare  . Drug use: No  . Sexual activity: Not Currently     Comment: declined condoms   Other Topics Concern  . Not on file   Social History Narrative   Lives alone.  Supportive friends and family.  His HIV Dx is not a secret.     Past Surgical History:  Procedure Laterality Date  . CARDIAC CATHETERIZATION N/A 07/29/2015   Procedure: Left Heart Cath and Coronary Angiography;  Surgeon: [name], MD;  Location: [facility];  Service: Cardiovascular;  Laterality: N/A;  . CARDIAC CATHETERIZATION N/A 07/29/2015   Procedure: Coronary Stent Intervention;  Surgeon: [name], MD;  Location: [facility];  Service: Cardiovascular;  Laterality: N/A;  .  CORONARY ARTERY BYPASS GRAFT  05/2002  . CORONARY STENT INTERVENTION N/A 12/01/2016   Procedure: Coronary Stent Intervention;  Surgeon: [name], MD;  Location: [facility];  Service: Cardiovascular;  Laterality: N/A;  . LEFT HEART CATH AND CORS/GRAFTS ANGIOGRAPHY N/A 11/24/2016   Procedure: Left Heart Cath and Cors/Grafts Angiography;  Surgeon: Nelva Bush, MD;  Location: [facility];  Service: Cardiovascular;  Laterality: N/A;  . LEFT HEART CATH AND CORS/GRAFTS ANGIOGRAPHY N/A 12/01/2016   Procedure: Left Heart Cath and Cors/Grafts Angiography;  Surgeon: [name], MD;   Location: [facility];  Service: Cardiovascular;  Laterality: N/A;  . VASECTOMY      Family History  Problem Relation Age of Onset  . Hyperlipidemia Mother   . Diabetes Mother        paternal grandparents/1 brother  . Hyperlipidemia Father   . Hypertension Father        paternal grandmother/3 brothers/1 sister  . Arthritis Unknown        mother/father/paternal grandparents  . Breast cancer Maternal Aunt        paternal aunt  . Lung cancer Maternal Aunt   . Heart disease Unknown        parents/maternal grandparents/ 2 brothers  . Stroke Paternal Grandmother   . Mental retardation Sister     No Known Allergies  Current Outpatient Prescriptions on File Prior to Visit  Medication Sig Dispense Refill  . acetaminophen (TYLENOL) 325 MG tablet Take 2 tablets (650 mg total) by mouth every 4 (four) hours as needed for headache or mild pain.    Marland Kitchen aspirin 81 MG chewable tablet Chew 81 mg by mouth daily.    Marland Kitchen atorvastatin (LIPITOR) 40 MG tablet Take 1 tablet (40 mg total) by mouth daily at 6 PM. 90 tablet 3  . b complex vitamins tablet Take 1 tablet by mouth daily. 30 tablet 11  . B-D UF III MINI PEN NEEDLES 31G X 5 MM MISC USE TWICE DAILY AS DIRECTED 100 each 0  . Cholecalciferol 2000 units CAPS Take 1 capsule (2,000 Units total) by mouth daily. 30 each 11  . diclofenac (VOLTAREN) 50 MG EC tablet TAKE 1 TABLET(50 MG) BY MOUTH TWICE DAILY 180 tablet 0  . FREESTYLE TEST STRIPS test strip USE TO CHECK BLOOD SUGAR THREE TIMES DAILY 300 each 3  . furosemide (LASIX) 20 MG tablet TAKE 2 TABLETS BY MOUTH DAILY AS NEEDED FOR FLUID RETENTION OR SWELLING (Patient taking differently: TAKE 1 TABLETS BY MOUTH DAILY AS NEEDED FOR FLUID RETENTION OR SWELLING) 90 tablet 0  . isosorbide mononitrate (IMDUR) 30 MG 24 hr tablet Take 1 tablet (30 mg total) by mouth daily. 30 tablet 5  . Lancets (FREESTYLE) lancets Use as directed three times a day to check blood sugar.  DX E11.9 100 each 11  . lisinopril  (PRINIVIL,ZESTRIL) 5 MG tablet TAKE 1/2 TABLET BY MOUTH DAILY 45 tablet 3  . metFORMIN (GLUCOPHAGE-XR) 500 MG 24 hr tablet Take 2 tablets (1,000 mg total) by mouth daily with supper. (Patient taking differently: Take 500 mg by mouth daily with supper. ) 60 tablet 3  . metoprolol tartrate (LOPRESSOR) 25 MG tablet Take 0.5 tablets (12.5 mg total) by mouth 2 (two) times daily. 60 tablet 5  . neomycin-polymyxin-hydrocortisone (CORTISPORIN) otic solution INSTILL 3 DROPS IN BOTH EARS THREE TIMES DAILY 10 mL 0  . nitroGLYCERIN (NITROSTAT) 0.4 MG SL tablet Place 1 tablet (0.4 mg total) under the tongue every 5 (  five) minutes as needed for chest pain. 25 tablet 3  . NORVIR 100 MG TABS tablet TAKE 1 TABLET(100 MG) BY MOUTH DAILY 30 tablet 11  . Omega-3 Fatty Acids (FISH OIL) 1000 MG CAPS Take by mouth. Takes 2 daily    . ondansetron (ZOFRAN) 4 MG tablet Take 1 tablet (4 mg total) by mouth every 8 (eight) hours as needed for nausea or vomiting. 20 tablet 0  . pantoprazole (PROTONIX) 40 MG tablet Take 1 tablet (40 mg total) by mouth daily. 30 tablet 5  . pioglitazone (ACTOS) 15 MG tablet TAKE 1 TABLET(15 MG) BY MOUTH DAILY 30 tablet 0  . potassium chloride SA (K-DUR,KLOR-CON) 20 MEQ tablet TAKE 1 TABLET(20 MEQ) BY MOUTH THREE TIMES DAILY 180 tablet 3  . PREZISTA 800 MG tablet TAKE 1 TABLET(800 MG) BY MOUTH DAILY 30 tablet 11  . Probiotic Product (PROBIOTIC DAILY) CAPS Take 1 by mouth daily 30 capsule 11  . repaglinide (PRANDIN) 2 MG tablet TAKE 2 TABLETS BY MOUTH BEFORE BREAKFAST, 1 TABLET BY MOUTH BEFORE LUNCH AND 2 TABLETS BY MOUTH BEFORE SUPPER 450 tablet 0  . rilpivirine (EDURANT) 25 MG TABS tablet Take 1 tablet (25 mg total) by mouth daily with breakfast. 30 tablet 5  . sodium bicarbonate 650 MG tablet Take 650 mg by mouth 3 (three) times daily.    . ticagrelor (BRILINTA) 90 MG TABS tablet Take 1 tablet (90 mg total) by mouth 2 (two) times daily. 180 tablet 3  . TIVICAY 50 MG tablet TAKE 1 TABLET BY MOUTH  DAILY 30 tablet 5  . triamcinolone cream (KENALOG) 0.1 % APP TO DRY ITCHY SKIN BID PRN  2  . valACYclovir (VALTREX) 500 MG tablet TAKE 1 TABLET BY MOUTH DAILY 30 tablet 5  . fluticasone (FLONASE) 50 MCG/ACT nasal spray Place 2 sprays into both nostrils daily. (Patient not taking: Reported on 05/05/2017) 16 g 6   No current facility-administered medications on file prior to visit.     BP 104/60 (BP Location: Left Arm)   Temp 98.1 F (36.7 C) (Oral)   Ht 5\' 3"  (1.6 m)   Wt 134 lb (60.8 kg)   BMI 23.74 kg/m       Objective:   Physical Exam  Constitutional: He is oriented to person, place, and time. He appears well-developed and well-nourished. No distress.  Cardiovascular: Normal rate, regular rhythm, normal heart sounds and intact distal pulses.  Exam reveals no gallop and no friction rub.   No murmur heard. Pulmonary/Chest: Effort normal and breath sounds normal. No respiratory distress. He has no wheezes. He has no rales. He exhibits no tenderness.  Neurological: He is alert and oriented to person, place, and time.  Skin: Skin is warm and dry. No rash noted. He is not diaphoretic. No erythema. No pallor.  Psychiatric: He has a normal mood and affect. His behavior is normal. Judgment and thought content normal.  Nursing note and vitals reviewed.     Assessment & Plan:  1. Shortness of breath - Unsure of what is causing his symptoms at this time.  - Will get chest x ray. He does not have a smoking history. Can consider pulmonary consult in the future.  - DG Chest 2 View; Future - Will follow up with patient once results are back   Dorothyann Peng, AGNP

## 2017-05-06 ENCOUNTER — Other Ambulatory Visit: Payer: Self-pay | Admitting: Internal Medicine

## 2017-05-06 DIAGNOSIS — B20 Human immunodeficiency virus [HIV] disease: Secondary | ICD-10-CM

## 2017-05-12 ENCOUNTER — Other Ambulatory Visit: Payer: Self-pay | Admitting: Family Medicine

## 2017-05-12 DIAGNOSIS — R0602 Shortness of breath: Secondary | ICD-10-CM

## 2017-05-14 ENCOUNTER — Ambulatory Visit: Payer: Medicare Other

## 2017-05-19 ENCOUNTER — Ambulatory Visit (INDEPENDENT_AMBULATORY_CARE_PROVIDER_SITE_OTHER): Payer: Medicare Other | Admitting: *Deleted

## 2017-05-19 DIAGNOSIS — Z23 Encounter for immunization: Secondary | ICD-10-CM

## 2017-05-21 DIAGNOSIS — R221 Localized swelling, mass and lump, neck: Secondary | ICD-10-CM | POA: Diagnosis not present

## 2017-05-22 ENCOUNTER — Other Ambulatory Visit: Payer: Self-pay | Admitting: Cardiology

## 2017-05-26 ENCOUNTER — Other Ambulatory Visit: Payer: Self-pay | Admitting: Student

## 2017-05-26 ENCOUNTER — Encounter: Payer: Self-pay | Admitting: Cardiology

## 2017-05-27 ENCOUNTER — Other Ambulatory Visit: Payer: Self-pay | Admitting: *Deleted

## 2017-05-27 MED ORDER — ISOSORBIDE MONONITRATE ER 30 MG PO TB24: 30.0000 mg | ORAL_TABLET | Freq: Every day | ORAL | 4 refills | Status: DC

## 2017-06-01 ENCOUNTER — Other Ambulatory Visit: Payer: Self-pay | Admitting: *Deleted

## 2017-06-01 DIAGNOSIS — R0602 Shortness of breath: Secondary | ICD-10-CM

## 2017-06-02 ENCOUNTER — Ambulatory Visit (INDEPENDENT_AMBULATORY_CARE_PROVIDER_SITE_OTHER): Payer: Medicare Other | Admitting: Internal Medicine

## 2017-06-02 DIAGNOSIS — R0602 Shortness of breath: Secondary | ICD-10-CM | POA: Diagnosis not present

## 2017-06-02 LAB — PULMONARY FUNCTION TEST
DL/VA % PRED: 80 %
DL/VA: 3.29 ml/min/mmHg/L
DLCO UNC % PRED: 63 %
DLCO cor % pred: 62 %
DLCO cor: 14.32 ml/min/mmHg
DLCO unc: 14.48 ml/min/mmHg
FEF 25-75 PRE: 2.11 L/s
FEF 25-75 Post: 2.43 L/sec
FEF2575-%CHANGE-POST: 15 %
FEF2575-%PRED-POST: 127 %
FEF2575-%Pred-Pre: 110 %
FEV1-%CHANGE-POST: 4 %
FEV1-%PRED-PRE: 90 %
FEV1-%Pred-Post: 94 %
FEV1-PRE: 2.2 L
FEV1-Post: 2.31 L
FEV1FVC-%Change-Post: 2 %
FEV1FVC-%PRED-PRE: 106 %
FEV6-%Change-Post: 0 %
FEV6-%PRED-POST: 89 %
FEV6-%Pred-Pre: 89 %
FEV6-POST: 2.79 L
FEV6-PRE: 2.78 L
FEV6FVC-%PRED-PRE: 107 %
FEV6FVC-%Pred-Post: 107 %
FVC-%CHANGE-POST: 2 %
FVC-%PRED-POST: 85 %
FVC-%PRED-PRE: 83 %
FVC-POST: 2.85 L
FVC-PRE: 2.79 L
POST FEV1/FVC RATIO: 81 %
PRE FEV6/FVC RATIO: 100 %
Post FEV6/FVC ratio: 100 %
Pre FEV1/FVC ratio: 79 %
RV % PRED: 87 %
RV: 1.78 L
TLC % pred: 83 %
TLC: 4.66 L

## 2017-06-02 NOTE — Progress Notes (Signed)
PFT done today by Lindsay Lemons, CMA  

## 2017-06-04 NOTE — Progress Notes (Signed)
HPI: FU coronary artery disease. Patient is status post coronary artery bypass and graft in 2003. Carotid Dopplers December 2016 showed 1-39% bilateral stenosis. Echocardiogram April 2018 showed normal LV systolic function, grade 2 diastolic dysfunction, akinesis of the inferior and inferoseptal myocardium and trace mitral regurgitation; calcified aortic valve but no significant aortic stenosis by Doppler. Patient had cardiac catheterization April 2018. He had severe three-vessel coronary disease with patent LIMA to the LAD, patent saphenous vein graft to the diagonal with 80-90% stenosis in the diagonal distal to the anastomosis, patent saphenous vein graft to the first and second marginal with 80-90% stenosis in the jump graft immediately distal to the OM1 anastomosis and occluded saphenous vein graft to the PDA. Patient subsequently had successful PCI of the sequential graft to the ramus and OM branches. Since last seen, he had one brief episode of chest pain relieved with nitroglycerin but attributes that to stress. He has not had exertional chest pain. No dyspnea or syncope.  Current Outpatient Medications  Medication Sig Dispense Refill  . acetaminophen (TYLENOL) 325 MG tablet Take 2 tablets (650 mg total) by mouth every 4 (four) hours as needed for headache or mild pain.    Marland Kitchen aspirin 81 MG chewable tablet Chew 81 mg by mouth daily.    Marland Kitchen atorvastatin (LIPITOR) 40 MG tablet Take 1 tablet (40 mg total) by mouth daily at 6 PM. 90 tablet 3  . b complex vitamins tablet Take 1 tablet by mouth daily. 30 tablet 11  . B-D UF III MINI PEN NEEDLES 31G X 5 MM MISC USE TWICE DAILY AS DIRECTED 100 each 0  . Cholecalciferol 2000 units CAPS Take 1 capsule (2,000 Units total) by mouth daily. 30 each 11  . diclofenac (VOLTAREN) 50 MG EC tablet TAKE 1 TABLET(50 MG) BY MOUTH TWICE DAILY 180 tablet 0  . EDURANT 25 MG TABS tablet TAKE 1 TABLET(25 MG) BY MOUTH DAILY WITH BREAKFAST 30 tablet 5  . fluticasone  (FLONASE) 50 MCG/ACT nasal spray Place 2 sprays into both nostrils daily. 16 g 6  . FREESTYLE TEST STRIPS test strip USE TO CHECK BLOOD SUGAR THREE TIMES DAILY 300 each 3  . furosemide (LASIX) 20 MG tablet TAKE 2 TABLETS BY MOUTH DAILY AS NEEDED FOR FLUID RETENTION OR SWELLING (Patient taking differently: TAKE 1 TABLETS BY MOUTH DAILY AS NEEDED FOR FLUID RETENTION OR SWELLING) 90 tablet 0  . isosorbide mononitrate (IMDUR) 30 MG 24 hr tablet Take 1 tablet (30 mg total) by mouth daily. 30 tablet 4  . Lancets (FREESTYLE) lancets Use as directed three times a day to check blood sugar.  DX E11.9 100 each 11  . lisinopril (PRINIVIL,ZESTRIL) 5 MG tablet TAKE 1/2 TABLET BY MOUTH DAILY 45 tablet 3  . metFORMIN (GLUCOPHAGE-XR) 500 MG 24 hr tablet Take 2 tablets (1,000 mg total) by mouth daily with supper. (Patient taking differently: Take 500 mg by mouth daily with supper. ) 60 tablet 3  . metoprolol tartrate (LOPRESSOR) 25 MG tablet Take 0.5 tablets (12.5 mg total) by mouth 2 (two) times daily. 60 tablet 5  . neomycin-polymyxin-hydrocortisone (CORTISPORIN) otic solution INSTILL 3 DROPS IN BOTH EARS THREE TIMES DAILY 10 mL 0  . nitroGLYCERIN (NITROSTAT) 0.4 MG SL tablet Place 1 tablet (0.4 mg total) under the tongue every 5 (five) minutes as needed for chest pain. 25 tablet 3  . Omega-3 Fatty Acids (FISH OIL) 1000 MG CAPS Take by mouth. Takes 2 daily    . ondansetron (  ZOFRAN) 4 MG tablet Take 1 tablet (4 mg total) by mouth every 8 (eight) hours as needed for nausea or vomiting. 20 tablet 0  . pantoprazole (PROTONIX) 40 MG tablet Take 1 tablet (40 mg total) by mouth daily. 30 tablet 5  . pioglitazone (ACTOS) 15 MG tablet TAKE 1 TABLET(15 MG) BY MOUTH DAILY 30 tablet 0  . potassium chloride SA (K-DUR,KLOR-CON) 20 MEQ tablet TAKE 1 TABLET(20 MEQ) BY MOUTH THREE TIMES DAILY 180 tablet 3  . PREZISTA 800 MG tablet TAKE 1 TABLET(800 MG) BY MOUTH DAILY 30 tablet 11  . Probiotic Product (PROBIOTIC DAILY) CAPS Take 1 by  mouth daily 30 capsule 11  . repaglinide (PRANDIN) 2 MG tablet TAKE 2 TABLETS BY MOUTH BEFORE BREAKFAST, 1 TABLET BY MOUTH BEFORE LUNCH AND 2 TABLETS BY MOUTH BEFORE SUPPER 450 tablet 0  . ritonavir (NORVIR) 100 MG TABS tablet TAKE 1 TABLET(100 MG) BY MOUTH DAILY 30 tablet 5  . sodium bicarbonate 650 MG tablet Take 650 mg by mouth 3 (three) times daily.    . ticagrelor (BRILINTA) 90 MG TABS tablet Take 1 tablet (90 mg total) by mouth 2 (two) times daily. 180 tablet 3  . TIVICAY 50 MG tablet TAKE 1 TABLET BY MOUTH DAILY 30 tablet 5  . triamcinolone cream (KENALOG) 0.1 % APP TO DRY ITCHY SKIN BID PRN  2  . valACYclovir (VALTREX) 500 MG tablet TAKE 1 TABLET BY MOUTH DAILY 30 tablet 5   No current facility-administered medications for this visit.      Past Medical History:  Diagnosis Date  . Absolute anemia 05/28/2014  . Aortic stenosis 09/17/2015  . Arthritis   . Ascites 11/18/2013  . BELLS PALSY 07/19/2010   Qualifier: Diagnosis of  By: [name], [name]    . CAD (coronary artery disease)    a. s/p CABG in 2003 b. s/p PCI to SVG-PDA in 07/2015 c. 11/2016: cath showing severe native CAD with patent LIMA-LAD and SVG-D1 with 80% stenosis of SVG-OM1-OM2. Initially medical management was recommended --> presented with recurrent angina --> s/p Synergy DES to proximal body of SVG-OM1-OM2, POBA to distal graft.   Marland Kitchen CAD- S/P PCI SVG-OM1 12/01/16 05/12/2006   Qualifier: Diagnosis of  By: [name], [name]    . Carotid bruit 07/10/2015  . Chest pain 11/22/2016  . Chronic kidney disease   . Chronic renal insufficiency, stage III (moderate) ([place]) 12/02/2016  . Constipation 05/28/2014  . COPD 07/20/2008   Qualifier: Diagnosis of  By: [name], [name]   . DEPRESSION 09/03/2006   Qualifier: Diagnosis of  By: [name], [name]    . Dermatitis 11/27/2012  . Diabetes mellitus   . Diarrhea 11/20/2013  . DM (diabetes mellitus), type 2 with ophthalmic complications ([place]) 1/61/0960   Qualifier: Diagnosis of  By:  [name], [name]    . Dry eye syndrome 12/20/2010  . Epicondylitis 06/05/2013   right  . Erectile dysfunction 01/01/2012  . Essential hypertension 05/12/2006   Qualifier: Diagnosis of  By: [name], [name]    . GENITAL HERPES 05/03/2009   Qualifier: Diagnosis of  By: [name], [name]    . GERD 09/03/2006   Qualifier: Diagnosis of  By: [name], [name]    . GERD (gastroesophageal reflux disease)   . HEARING LOSS, SENSORINEURAL 05/12/2006   Qualifier: Diagnosis of  By: [name], [name]    . HEMATOCHEZIA 04/18/2008   Annotation: 9/09 during bout of constipation Qualifier: Diagnosis of  By: [name], [name]    .  HIP PAIN, BILATERAL 07/17/2008   Qualifier: Diagnosis of  By: [name], [name]   . History of depression   . History of kidney stones   . HIV (human immunodeficiency virus infection) ([place]) 1991   on meds since initial dx.   Marland Kitchen HLD (hyperlipidemia) 11/19/2013  . Human immunodeficiency virus (HIV) disease ([place]) 05/12/2006   Qualifier: Diagnosis of  By: [name], [name]    . Hx of CABG 09/03/2006   Annotation: 2003 Qualifier: Diagnosis of  By: [name], [name]    . Hyperkalemia 03/23/2014  . Hyperlipidemia   . Hypertension   . Hyponatremia 03/23/2014  . INGUINAL LYMPHADENOPATHY, RIGHT 04/03/2009   Qualifier: Diagnosis of  By: [name], [name]    . Keratoma 01/24/2015  . KNEE PAIN, BILATERAL 07/17/2008   Qualifier: Diagnosis of  By: [name], [name]   . Lesion of breast 07/21/2015  . Lipodystrophy 12/20/2010  . Memory loss 09/18/2008   Qualifier: Diagnosis of  By: [name], [name]   . Metatarsal deformity 01/24/2015  . Nasal abscess 12/04/2015  . Night sweats 07/06/2012  . Nocturia 03/06/2013  . NSTEMI (non-ST elevated myocardial infarction) ([place]) 11/29/2016  . Pain in joint, ankle and foot 01/19/2015  . Pancreatitis 11/2013   attributed to HIV meds.   . Pedal edema 05/21/2014  . PERIPHERAL VASCULAR DISEASE 07/20/2008   Qualifier: Diagnosis of  By: [name], [name]   . Posterior  cervical lymphadenopathy 03/30/2014  . Protein-calorie malnutrition, severe ([place]) 03/23/2014  . SHINGLES, HX OF 05/03/2009   Annotation: R leg Qualifier: Diagnosis of  By: [name], [name]    . STEMI (ST elevation myocardial infarction) ([place]) 07/29/2015  . Unstable angina ([place]) 11/24/2016  . WEIGHT LOSS, ABNORMAL 04/03/2009   Qualifier: Diagnosis of  By: [name], [name]      Past Surgical History:  Procedure Laterality Date  . CORONARY ARTERY BYPASS GRAFT  05/2002  . VASECTOMY      Social History   Socioeconomic History  . Marital status: Divorced    Spouse name: Not on file  . Number of children: 4  . Years of education: Not on file  . Highest education level: Not on file  Social Needs  . Financial resource strain: Not on file  . Food insecurity - worry: Not on file  . Food insecurity - inability: Not on file  . Transportation needs - medical: Not on file  . Transportation needs - non-medical: Not on file  Occupational History  . 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   Tobacco Use  . Smoking status: Never Smoker  . Smokeless tobacco: Never Used  Substance and Sexual Activity  . Alcohol use: Yes    Alcohol/week: 0.0 oz    Comment: rare  . Drug use: No  . Sexual activity: Not Currently    Comment: declined condoms  Other Topics Concern  . Not on file  Social History Narrative   Lives alone.  Supportive friends and family.  His HIV Dx is not a secret.     Family History  Problem Relation Age of Onset  . Hyperlipidemia Mother   . Diabetes Mother        paternal grandparents/1 brother  . Hyperlipidemia Father   . Hypertension Father        paternal grandmother/3 brothers/1 sister  . Arthritis Unknown        mother/father/paternal grandparents  . Breast  cancer Maternal Aunt        paternal aunt  . Lung cancer Maternal Aunt   . Heart disease Unknown        parents/maternal grandparents/ 2 brothers  .  Stroke Paternal Grandmother   . Mental retardation Sister     ROS: no fevers or chills, productive cough, hemoptysis, dysphasia, odynophagia, melena, hematochezia, dysuria, hematuria, rash, seizure activity, orthopnea, PND, pedal edema, claudication. Remaining systems are negative.  Physical Exam: Well-developed well-nourished in no acute distress.  Skin is warm and dry.  HEENT is normal.  Neck is supple.  Chest is clear to auscultation with normal expansion.  Cardiovascular exam is regular rate and rhythm. 2/6 systolic murmur left sternal border. S2 is not diminished. Abdominal exam nontender or distended. No masses palpated. Extremities show no edema. neuro grossly intact   A/P  1 coronary artery disease status post coronary artery bypassing graft and recent PCI of saphenous vein graft to the first and second marginal-patient doing well with no chest pain. Plan to continue aspirin, Brilinta, statin. Will DC Brilinta in April 2019 and continue aspirin thereafter.  2 hypertension-blood pressure is borderline. Continue present medications and follow. May need to his continue isosorbide or metoprolol if he becomes symptomatic.  3 hyperlipidemia-continue statin.most recent lipid panel from 01/05/2017 personally reviewed. Total cholesterol 122 with LDL 55. Liver functions normal.  4 diabetes mellitus-management per primary care.  5 chronic stage III kidney disease  6 history of mild aortic stenosis-most recent echocardiogram showed no significant stenosis. The valve was calcified.  Kirk Ruths, MD

## 2017-06-05 ENCOUNTER — Other Ambulatory Visit: Payer: Self-pay | Admitting: Internal Medicine

## 2017-06-05 DIAGNOSIS — B20 Human immunodeficiency virus [HIV] disease: Secondary | ICD-10-CM

## 2017-06-15 ENCOUNTER — Encounter: Payer: Self-pay | Admitting: Cardiology

## 2017-06-15 ENCOUNTER — Ambulatory Visit (INDEPENDENT_AMBULATORY_CARE_PROVIDER_SITE_OTHER): Payer: Medicare Other | Admitting: Cardiology

## 2017-06-15 VITALS — BP 94/52 | HR 68 | Ht 63.0 in | Wt 133.0 lb

## 2017-06-15 DIAGNOSIS — I2510 Atherosclerotic heart disease of native coronary artery without angina pectoris: Secondary | ICD-10-CM

## 2017-06-15 DIAGNOSIS — I10 Essential (primary) hypertension: Secondary | ICD-10-CM

## 2017-06-15 DIAGNOSIS — Z9861 Coronary angioplasty status: Secondary | ICD-10-CM

## 2017-06-15 DIAGNOSIS — E7800 Pure hypercholesterolemia, unspecified: Secondary | ICD-10-CM | POA: Diagnosis not present

## 2017-06-15 NOTE — Patient Instructions (Signed)
Your physician wants you to follow-up in: 6 MONTHS WITH DR CRENSHAW You will receive a reminder letter in the mail two months in advance. If you don't receive a letter, please call our office to schedule the follow-up appointment.   If you need a refill on your cardiac medications before your next appointment, please call your pharmacy.  

## 2017-06-19 ENCOUNTER — Other Ambulatory Visit: Payer: Self-pay | Admitting: Internal Medicine

## 2017-06-19 DIAGNOSIS — B029 Zoster without complications: Secondary | ICD-10-CM

## 2017-06-25 ENCOUNTER — Other Ambulatory Visit: Payer: Self-pay | Admitting: Endocrinology

## 2017-07-04 ENCOUNTER — Other Ambulatory Visit: Payer: Self-pay | Admitting: Endocrinology

## 2017-07-04 ENCOUNTER — Other Ambulatory Visit: Payer: Self-pay | Admitting: Internal Medicine

## 2017-07-04 DIAGNOSIS — B20 Human immunodeficiency virus [HIV] disease: Secondary | ICD-10-CM

## 2017-07-06 ENCOUNTER — Other Ambulatory Visit: Payer: Self-pay | Admitting: Pharmacist Clinician (PhC)/ Clinical Pharmacy Specialist

## 2017-07-06 DIAGNOSIS — B20 Human immunodeficiency virus [HIV] disease: Secondary | ICD-10-CM

## 2017-07-06 MED ORDER — ATORVASTATIN CALCIUM 20 MG PO TABS: 20.0000 mg | ORAL_TABLET | Freq: Every day | ORAL | 3 refills | Status: DC

## 2017-07-06 MED ORDER — RITONAVIR 100 MG PO TABS: ORAL_TABLET | ORAL | 5 refills | Status: DC

## 2017-07-06 MED ORDER — RILPIVIRINE HCL 25 MG PO TABS: ORAL_TABLET | ORAL | 5 refills | Status: DC

## 2017-07-06 MED ORDER — DARUNAVIR ETHANOLATE 800 MG PO TABS: ORAL_TABLET | ORAL | 4 refills | Status: DC

## 2017-07-06 MED ORDER — DOLUTEGRAVIR SODIUM 50 MG PO TABS: 50.0000 mg | ORAL_TABLET | Freq: Every day | ORAL | 5 refills | Status: DC

## 2017-07-06 NOTE — Progress Notes (Signed)
It was noticed that Brixon was recently started on Flonase. This would interact with his current ART (DRV/r/DTG/RPV). He was originally on NVP/CBV>>pancreatitis>>Stribild>>pancreatitis>>DRV/r/DTG/RPV. He is on the NRTI sparing for this reason. He said that he stop flonase after about 2 wks because it didn't work. However, he is also on Lipitor 40mg . This is also an issue with the ritonavir. After discussing with Dr. Megan Salon, we are going to change him to [place]. Counseled him on the phone today and he is willing to do the change. Will send to [facility] since he has SPAP.   Addendum:  Gamble is also on Protonix. This is a major issue with his rilpivirine. Juluca will not be an option here. Called him back to tell him to continue the current regimen until the appt with Dr. Megan Salon on 12/20. We will send in a new rx for lipitor 20mg  due to the interaction with ritonavir. At the appt, we can probably simplify his regimen to just Prezcobix + Tivicay.

## 2017-07-06 NOTE — Progress Notes (Signed)
Reduce Lipitor due to interaction with Norvir.

## 2017-07-09 ENCOUNTER — Other Ambulatory Visit: Payer: Medicare Other

## 2017-07-09 DIAGNOSIS — B20 Human immunodeficiency virus [HIV] disease: Secondary | ICD-10-CM

## 2017-07-10 ENCOUNTER — Other Ambulatory Visit (INDEPENDENT_AMBULATORY_CARE_PROVIDER_SITE_OTHER): Payer: Medicare Other

## 2017-07-10 DIAGNOSIS — E119 Type 2 diabetes mellitus without complications: Secondary | ICD-10-CM | POA: Diagnosis not present

## 2017-07-10 LAB — COMPREHENSIVE METABOLIC PANEL
ALBUMIN: 4.1 g/dL (ref 3.5–5.2)
ALK PHOS: 48 U/L (ref 39–117)
ALT: 18 U/L (ref 0–53)
AST: 19 U/L (ref 0–37)
BUN: 29 mg/dL — AB (ref 6–23)
CHLORIDE: 108 meq/L (ref 96–112)
CO2: 23 mEq/L (ref 19–32)
Calcium: 8.7 mg/dL (ref 8.4–10.5)
Creatinine, Ser: 1.58 mg/dL — ABNORMAL HIGH (ref 0.40–1.50)
GFR: 46.43 mL/min — AB (ref >60.00)
GLUCOSE: 92 mg/dL (ref 70–99)
POTASSIUM: 4.6 meq/L (ref 3.5–5.1)
SODIUM: 139 meq/L (ref 135–145)
Total Bilirubin: 0.5 mg/dL (ref 0.2–1.2)
Total Protein: 6.6 g/dL (ref 6.0–8.3)

## 2017-07-10 LAB — HEMOGLOBIN A1C: HEMOGLOBIN A1C: 5.8 % (ref 4.6–6.5)

## 2017-07-10 LAB — MICROALBUMIN / CREATININE URINE RATIO
CREATININE, U: 93.1 mg/dL
MICROALB/CREAT RATIO: 3.7 mg/g (ref 0.0–30.0)
Microalb, Ur: 3.4 mg/dL — ABNORMAL HIGH (ref 0.0–1.9)

## 2017-07-10 LAB — T-HELPER CELL (CD4) - (RCID CLINIC ONLY)
CD4 T CELL ABS: 490 /uL (ref 400–2700)
CD4 T CELL HELPER: 44 % (ref 33–55)

## 2017-07-13 ENCOUNTER — Ambulatory Visit: Payer: Medicare Other | Admitting: Endocrinology

## 2017-07-13 LAB — HIV-1 RNA QUANT-NO REFLEX-BLD
HIV 1 RNA Quant: <20 copies/mL — AB
HIV-1 RNA Quant, Log: <1.3 Log copies/mL — AB

## 2017-07-23 ENCOUNTER — Other Ambulatory Visit: Payer: Self-pay | Admitting: Pharmacist Clinician (PhC)/ Clinical Pharmacy Specialist

## 2017-07-23 ENCOUNTER — Ambulatory Visit (INDEPENDENT_AMBULATORY_CARE_PROVIDER_SITE_OTHER): Payer: Medicare Other | Admitting: Internal Medicine

## 2017-07-23 DIAGNOSIS — I2510 Atherosclerotic heart disease of native coronary artery without angina pectoris: Secondary | ICD-10-CM

## 2017-07-23 DIAGNOSIS — B20 Human immunodeficiency virus [HIV] disease: Secondary | ICD-10-CM

## 2017-07-23 DIAGNOSIS — Z9861 Coronary angioplasty status: Secondary | ICD-10-CM | POA: Diagnosis not present

## 2017-07-23 MED ORDER — DARUNAVIR-COBICISTAT 800-150 MG PO TABS: 1.0000 | ORAL_TABLET | Freq: Every day | ORAL | 5 refills | Status: DC

## 2017-07-23 MED ORDER — DOLUTEGRAVIR-RILPIVIRINE 50-25 MG PO TABS: 1.0000 | ORAL_TABLET | Freq: Every day | ORAL | 5 refills | Status: DC

## 2017-07-23 NOTE — Progress Notes (Signed)
Changing ART

## 2017-07-23 NOTE — Addendum Note (Signed)
Addended by: Dinah Beers on: 07/23/2017 12:13 PM   Modules accepted: Orders

## 2017-07-23 NOTE — Assessment & Plan Note (Signed)
His infection remains under excellent, long-term control.  I will continue his current regimen but will work with our pharmacists to see if there are easy ways to simplify his regimen.  He will follow-up after blood work in 6 months.

## 2017-07-23 NOTE — Progress Notes (Signed)
HPI: [name] is a 71 y.o. male who is here to see Dr. Megan [name] for his HIV visit.   Allergies: No Known Allergies  Vitals: Temp: 97.5 F (36.4 C) (12/20 0958) Temp Source: Oral (12/20 0958) BP: 121/66 (12/20 0958) Pulse Rate: 66 (12/20 0958)  Past Medical History: Past Medical History:  Diagnosis Date  . Absolute anemia 05/28/2014  . Aortic stenosis 09/17/2015  . Arthritis   . Ascites 11/18/2013  . BELLS PALSY 07/19/2010   Qualifier: Diagnosis of  By: Christian Mares MD, [name]    . CAD (coronary artery disease)    a. s/p CABG in 2003 b. s/p PCI to SVG-PDA in 07/2015 c. 11/2016: cath showing severe native CAD with patent LIMA-LAD and SVG-D1 with 80% stenosis of SVG-OM1-OM2. Initially medical management was recommended --> presented with recurrent angina --> s/p Synergy DES to proximal body of SVG-OM1-OM2, POBA to distal graft.   Marland Kitchen CAD- S/P PCI SVG-OM1 12/01/16 05/12/2006   Qualifier: Diagnosis of  By: Christian Salon MD, [name]    . Carotid bruit 07/10/2015  . Chest pain 11/22/2016  . Chronic kidney disease   . Chronic renal insufficiency, stage III (moderate) ([place]) 12/02/2016  . Constipation 05/28/2014  . COPD 07/20/2008   Qualifier: Diagnosis of  By: Christian Reichmann MD, [name]   . DEPRESSION 09/03/2006   Qualifier: Diagnosis of  By: Christian Salon MD, [name]    . Dermatitis 11/27/2012  . Diabetes mellitus   . Diarrhea 11/20/2013  . DM (diabetes mellitus), type 2 with ophthalmic complications ([place]) 9/52/8413   Qualifier: Diagnosis of  By: Christian Salon MD, [name]    . Dry eye syndrome 12/20/2010  . Epicondylitis 06/05/2013   right  . Erectile dysfunction 01/01/2012  . Essential hypertension 05/12/2006   Qualifier: Diagnosis of  By: Christian Salon MD, [name]    . GENITAL HERPES 05/03/2009   Qualifier: Diagnosis of  By: Christian Salon MD, [name]    . GERD 09/03/2006   Qualifier: Diagnosis of  By: Christian Salon MD, [name]    . GERD (gastroesophageal reflux disease)   . HEARING LOSS, SENSORINEURAL 05/12/2006   Qualifier: Diagnosis of  By: Christian Salon  MD, [name]    . HEMATOCHEZIA 04/18/2008   Annotation: 9/09 during bout of constipation Qualifier: Diagnosis of  By: Christian Salon MD, [name]    . HIP PAIN, BILATERAL 07/17/2008   Qualifier: Diagnosis of  By: Christian Reichmann MD, [name]   . History of depression   . History of kidney stones   . HIV (human immunodeficiency virus infection) ([place]) 1991   on meds since initial dx.   Marland Kitchen HLD (hyperlipidemia) 11/19/2013  . Human immunodeficiency virus (HIV) disease ([place]) 05/12/2006   Qualifier: Diagnosis of  By: Christian Salon MD, [name]    . Hx of CABG 09/03/2006   Annotation: 2003 Qualifier: Diagnosis of  By: Christian Salon MD, [name]    . Hyperkalemia 03/23/2014  . Hyperlipidemia   . Hypertension   . Hyponatremia 03/23/2014  . INGUINAL LYMPHADENOPATHY, RIGHT 04/03/2009   Qualifier: Diagnosis of  By: Christian Salon MD, [name]    . Keratoma 01/24/2015  . KNEE PAIN, BILATERAL 07/17/2008   Qualifier: Diagnosis of  By: Christian Reichmann MD, [name]   . Lesion of breast 07/21/2015  . Lipodystrophy 12/20/2010  . Memory loss 09/18/2008   Qualifier: Diagnosis of  By: Christian Reichmann MD, [name]   . Metatarsal deformity 01/24/2015  . Nasal abscess 12/04/2015  . Night sweats 07/06/2012  . Nocturia 03/06/2013  . NSTEMI (non-ST elevated myocardial infarction) ([place]) 11/29/2016  . Pain in  joint, ankle and foot 01/19/2015  . Pancreatitis 11/2013   attributed to HIV meds.   . Pedal edema 05/21/2014  . PERIPHERAL VASCULAR DISEASE 07/20/2008   Qualifier: Diagnosis of  By: Christian Reichmann MD, [name]   . Posterior cervical lymphadenopathy 03/30/2014  . Protein-calorie malnutrition, severe ([place]) 03/23/2014  . SHINGLES, HX OF 05/03/2009   Annotation: R leg Qualifier: Diagnosis of  By: Christian Salon MD, [name]    . STEMI (ST elevation myocardial infarction) ([place]) 07/29/2015  . Unstable angina ([place]) 11/24/2016  . WEIGHT LOSS, ABNORMAL 04/03/2009   Qualifier: Diagnosis of  By: Christian Salon MD, [name]      Social History: Social History   Socioeconomic History  . Marital status: Divorced    Spouse name: Not  on file  . Number of children: 4  . Years of education: Not on file  . Highest education level: Not on file  Social Needs  . Financial resource strain: Not on file  . Food insecurity - worry: Not on file  . Food insecurity - inability: Not on file  . Transportation needs - medical: Not on file  . Transportation needs - non-medical: Not on file  Occupational History  . 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   Tobacco Use  . Smoking status: Never Smoker  . Smokeless tobacco: Never Used  Substance and Sexual Activity  . Alcohol use: Yes    Alcohol/week: 0.0 oz    Comment: rare  . Drug use: No  . Sexual activity: Not Currently    Comment: declined condoms  Other Topics Concern  . Not on file  Social History Narrative   Lives alone.  Supportive friends and family.  His HIV Dx is not a secret.     Previous Regimen: NVP/CBV>>pancreatitis>>Stribild>>pancreatitis>>DRV/r/DTG/RPV  Current Regimen: DRV/r/RPV/DTG  Labs: HIV 1 RNA Quant (copies/mL)  Date Value  07/09/2017 <20 DETECTED (A)  01/05/2017 <20 DETECTED (A)  04/01/2016 <20   HIV-1 RNA Viral Load (no units)  Date Value  07/14/2013 <40  04/13/2013 <40  01/18/2013 <40   CD4 (no units)  Date Value  07/14/2013 514  04/13/2013 498  01/18/2013 522   CD4 T Cell Abs (/uL)  Date Value  07/09/2017 490  01/05/2017 370 (L)  04/01/2016 500   Hep B S Ab (no units)  Date Value  03/24/2014 POSITIVE (A)   Hepatitis B Surface Ag (no units)  Date Value  03/24/2014 NEGATIVE   HCV Ab (no units)  Date Value  09/29/2012 NEGATIVE    CrCl: Estimated Creatinine Clearance: 35.5 mL/min (A) (by C-G formula based on SCr of 1.58 mg/dL (H)).  Lipids:    Component Value Date/Time   CHOL 122 01/05/2017 1202   TRIG 183 (H) 01/05/2017 1202   HDL 30 (L) 01/05/2017 1202   CHOLHDL 4.1 01/05/2017 1202   VLDL 37 (H) 01/05/2017 1202   LDLCALC 55 01/05/2017 1202     Assessment: [name] has been on a NRTI sparing regimen due to a hx of pancreatitis. We were going to simplify his regimen but he'd like to wait until the visit today. We were planning to do Juluca/Prezcobix but he has been on a PPI. Dr. Megan [name] didn't get a chance to discuss the change with him today so I called him back to discuss the change to Prezcobix/Juluca. I think the PPI interaction will be ok since he is so compliance. He does agree to the change. Since he has SPAP, will  [facility].   Recommendations:  Stop DRV/r/DTG/RPV Start Juluca 1 PO qday with food Start Prezcobix 1 PO qday with food   Onnie Boer, PharmD, BCPS, AAHIVP, CPP Clinical [facility] [place] [facility] 07/23/2017, 11:58 AM

## 2017-07-23 NOTE — Progress Notes (Signed)
Patient Active Problem List   Diagnosis Date Noted  . DM (diabetes mellitus), type 2 with ophthalmic complications ([place]) 47/42/5956    Priority: High  . Human immunodeficiency virus (HIV) disease ([place]) 05/12/2006    Priority: High  . CAD- S/P PCI SVG-OM1 12/01/16 05/12/2006    Priority: High  . Chronic renal insufficiency, stage III (moderate) ([place]) 12/02/2016  . NSTEMI (non-ST elevated myocardial infarction) ([place]) 11/29/2016  . Unstable angina ([place]) 11/24/2016  . Chest pain 11/22/2016  . Aortic stenosis 09/17/2015  . STEMI (ST elevation myocardial infarction) ([place]) 07/29/2015  . Lesion of breast 07/21/2015  . Carotid bruit 07/10/2015  . Metatarsal deformity 01/24/2015  . Keratoma 01/24/2015  . Pain in joint, ankle and foot 01/19/2015  . Absolute anemia 05/28/2014  . Constipation 05/28/2014  . Pedal edema 05/21/2014  . Posterior cervical lymphadenopathy 03/30/2014  . Hyperkalemia 03/23/2014  . Hyponatremia 03/23/2014  . Protein-calorie malnutrition, severe ([place]) 03/23/2014  . Diarrhea 11/20/2013  . HLD (hyperlipidemia) 11/19/2013  . Ascites 11/18/2013  . Pancreatitis 11/15/2013  . Epicondylitis 06/05/2013  . Nocturia 03/06/2013  . Dermatitis 11/27/2012  . Night sweats 07/06/2012  . Erectile dysfunction 01/01/2012  . Lipodystrophy 12/20/2010  . Dry eye syndrome 12/20/2010  . BELLS PALSY 07/19/2010  . GENITAL HERPES 05/03/2009  . SHINGLES, HX OF 05/03/2009  . WEIGHT LOSS, ABNORMAL 04/03/2009  . INGUINAL LYMPHADENOPATHY, RIGHT 04/03/2009  . MEMORY LOSS 09/18/2008  . PERIPHERAL VASCULAR DISEASE 07/20/2008  . COPD 07/20/2008  . HIP PAIN, BILATERAL 07/17/2008  . KNEE PAIN, BILATERAL 07/17/2008  . HEMATOCHEZIA 04/18/2008  . NEPHROLITHIASIS, HX OF 02/22/2008  . DEPRESSION 09/03/2006  . GERD 09/03/2006  . Hx of CABG 09/03/2006  . HEARING LOSS, SENSORINEURAL 05/12/2006  . Essential hypertension 05/12/2006      Medication List        Accurate as of  07/23/17 10:24 AM. Always use your most recent med list.          acetaminophen 325 MG tablet Commonly known as:  TYLENOL Take 2 tablets (650 mg total) by mouth every 4 (four) hours as needed for headache or mild pain.   aspirin 81 MG chewable tablet   atorvastatin 20 MG tablet Commonly known as:  LIPITOR Take 1 tablet (20 mg total) by mouth daily.   b complex vitamins tablet Take 1 tablet by mouth daily.   B-D UF III MINI PEN NEEDLES 31G X 5 MM Misc Generic drug:  Insulin Pen Needle USE TWICE DAILY AS DIRECTED   Cholecalciferol 2000 units Caps Take 1 capsule (2,000 Units total) by mouth daily.   darunavir 800 MG tablet Commonly known as:  PREZISTA TAKE 1 TABLET(800 MG) BY MOUTH DAILY   diclofenac 50 MG EC tablet Commonly known as:  VOLTAREN TAKE 1 TABLET(50 MG) BY MOUTH TWICE DAILY   dolutegravir 50 MG tablet Commonly known as:  TIVICAY Take 1 tablet (50 mg total) by mouth daily.   Fish Oil 1000 MG Caps   fluticasone 50 MCG/ACT nasal spray Commonly known as:  FLONASE Place 2 sprays into both nostrils daily.   freestyle lancets Use as directed three times a day to check blood sugar.  DX E11.9   FREESTYLE TEST STRIPS test strip Generic drug:  glucose blood USE TO CHECK BLOOD SUGAR THREE TIMES DAILY   furosemide 20 MG tablet Commonly known as:  LASIX TAKE 2 TABLETS BY MOUTH DAILY AS NEEDED FOR FLUID RETENTION OR SWELLING   isosorbide  mononitrate 30 MG 24 hr tablet Commonly known as:  IMDUR Take 1 tablet (30 mg total) by mouth daily.   lisinopril 5 MG tablet Commonly known as:  PRINIVIL,ZESTRIL TAKE 1/2 TABLET BY MOUTH DAILY   metFORMIN 500 MG 24 hr tablet Commonly known as:  GLUCOPHAGE-XR Take 2 tablets (1,000 mg total) by mouth daily with supper.   metoprolol tartrate 25 MG tablet Commonly known as:  LOPRESSOR Take 0.5 tablets (12.5 mg total) by mouth 2 (two) times daily.   neomycin-polymyxin-hydrocortisone OTIC solution Commonly known as:   CORTISPORIN INSTILL 3 DROPS IN BOTH EARS THREE TIMES DAILY   nitroGLYCERIN 0.4 MG SL tablet Commonly known as:  NITROSTAT Place 1 tablet (0.4 mg total) under the tongue every 5 (five) minutes as needed for chest pain.   ondansetron 4 MG tablet Commonly known as:  ZOFRAN Take 1 tablet (4 mg total) by mouth every 8 (eight) hours as needed for nausea or vomiting.   pantoprazole 40 MG tablet Commonly known as:  PROTONIX Take 1 tablet (40 mg total) by mouth daily.   pioglitazone 15 MG tablet Commonly known as:  ACTOS TAKE 1 TABLET(15 MG) BY MOUTH DAILY   potassium chloride SA 20 MEQ tablet Commonly known as:  K-DUR,KLOR-CON TAKE 1 TABLET(20 MEQ) BY MOUTH THREE TIMES DAILY   PROBIOTIC DAILY Caps Take 1 by mouth daily   repaglinide 2 MG tablet Commonly known as:  PRANDIN TAKE 2 TABLETS BY MOUTH BEFORE BREAKFAST, 1 TABLET BY MOUTH BEFORE LUNCH AND 2 TABLETS BY MOUTH BEFORE SUPPER   rilpivirine 25 MG Tabs tablet Commonly known as:  EDURANT TAKE 1 TABLET(25 MG) BY MOUTH DAILY WITH BREAKFAST   ritonavir 100 MG Tabs tablet Commonly known as:  NORVIR TAKE 1 TABLET(100 MG) BY MOUTH DAILY   sodium bicarbonate 650 MG tablet   ticagrelor 90 MG Tabs tablet Commonly known as:  BRILINTA Take 1 tablet (90 mg total) by mouth 2 (two) times daily.   TOUJEO SOLOSTAR 300 UNIT/ML Sopn Generic drug:  Insulin Glargine INJECT 10 UNITS UNDER THE SKIN DAILY   triamcinolone cream 0.1 % Commonly known as:  KENALOG   valACYclovir 500 MG tablet Commonly known as:  VALTREX TAKE 1 TABLET BY MOUTH DAILY       Subjective: [name] is in for his routine HIV follow-up visit.  No problems obtaining, taking or tolerating 6 months Edurant , Tivicay, Prezista or Norvir.  He takes them each evening.  He recalls missing only one dose since his last visit.  Review of Systems: Review of Systems  Constitutional: Negative for chills, diaphoresis, fever, malaise/fatigue and weight loss.  HENT: Negative for  sore throat.   Respiratory: Positive for shortness of breath. Negative for cough and sputum production.   Cardiovascular: Negative for chest pain.  Gastrointestinal: Negative for abdominal pain, diarrhea, heartburn, nausea and vomiting.  Genitourinary: Negative for dysuria and frequency.  Musculoskeletal: Negative for joint pain and myalgias.  Skin: Negative for rash.  Neurological: Negative for dizziness and headaches.  Psychiatric/Behavioral: Negative for depression and substance abuse. The patient is not nervous/anxious.     Past Medical History:  Diagnosis Date  . Absolute anemia 05/28/2014  . Aortic stenosis 09/17/2015  . Arthritis   . Ascites 11/18/2013  . BELLS PALSY 07/19/2010   Qualifier: Diagnosis of  By: [name], [name]    . CAD (coronary artery disease)    a. s/p CABG in 2003 b. s/p PCI to SVG-PDA in 07/2015 c. 11/2016: cath showing severe native  CAD with patent LIMA-LAD and SVG-D1 with 80% stenosis of SVG-OM1-OM2. Initially medical management was recommended --> presented with recurrent angina --> s/p Synergy DES to proximal body of SVG-OM1-OM2, POBA to distal graft.   Marland Kitchen CAD- S/P PCI SVG-OM1 12/01/16 05/12/2006   Qualifier: Diagnosis of  By: [name], Blondie Riggsbee    . Carotid bruit 07/10/2015  . Chest pain 11/22/2016  . Chronic kidney disease   . Chronic renal insufficiency, stage III (moderate) ([place]) 12/02/2016  . Constipation 05/28/2014  . COPD 07/20/2008   Qualifier: Diagnosis of  By: [name], [name]   . DEPRESSION 09/03/2006   Qualifier: Diagnosis of  By: [name], Camryn Quesinberry    . Dermatitis 11/27/2012  . Diabetes mellitus   . Diarrhea 11/20/2013  . DM (diabetes mellitus), type 2 with ophthalmic complications ([place]) 5/00/9381   Qualifier: Diagnosis of  By: [name], Taiden Raybourn    . Dry eye syndrome 12/20/2010  . Epicondylitis 06/05/2013   right  . Erectile dysfunction 01/01/2012  . Essential hypertension 05/12/2006   Qualifier: Diagnosis of  By: [name], Melanie Openshaw    . GENITAL  HERPES 05/03/2009   Qualifier: Diagnosis of  By: [name], Lavonne Cass    . GERD 09/03/2006   Qualifier: Diagnosis of  By: [name], Deshon Koslowski    . GERD (gastroesophageal reflux disease)   . HEARING LOSS, SENSORINEURAL 05/12/2006   Qualifier: Diagnosis of  By: [name], Dio Giller    . HEMATOCHEZIA 04/18/2008   Annotation: 9/09 during bout of constipation Qualifier: Diagnosis of  By: [name], Lakima Dona    . HIP PAIN, BILATERAL 07/17/2008   Qualifier: Diagnosis of  By: [name], [name]   . History of depression   . History of kidney stones   . HIV (human immunodeficiency virus infection) ([place]) 1991   on meds since initial dx.   Marland Kitchen HLD (hyperlipidemia) 11/19/2013  . Human immunodeficiency virus (HIV) disease ([place]) 05/12/2006   Qualifier: Diagnosis of  By: [name], Cherese Lozano    . Hx of CABG 09/03/2006   Annotation: 2003 Qualifier: Diagnosis of  By: [name], Junie Engram    . Hyperkalemia 03/23/2014  . Hyperlipidemia   . Hypertension   . Hyponatremia 03/23/2014  . INGUINAL LYMPHADENOPATHY, RIGHT 04/03/2009   Qualifier: Diagnosis of  By: [name], Briena Swingler    . Keratoma 01/24/2015  . KNEE PAIN, BILATERAL 07/17/2008   Qualifier: Diagnosis of  By: [name], [name]   . Lesion of breast 07/21/2015  . Lipodystrophy 12/20/2010  . Memory loss 09/18/2008   Qualifier: Diagnosis of  By: [name], [name]   . Metatarsal deformity 01/24/2015  . Nasal abscess 12/04/2015  . Night sweats 07/06/2012  . Nocturia 03/06/2013  . NSTEMI (non-ST elevated myocardial infarction) ([place]) 11/29/2016  . Pain in joint, ankle and foot 01/19/2015  . Pancreatitis 11/2013   attributed to HIV meds.   . Pedal edema 05/21/2014  . PERIPHERAL VASCULAR DISEASE 07/20/2008   Qualifier: Diagnosis of  By: [name], [name]   . Posterior cervical lymphadenopathy 03/30/2014  . Protein-calorie malnutrition, severe ([place]) 03/23/2014  . SHINGLES, HX OF 05/03/2009   Annotation: R leg Qualifier: Diagnosis of  By: [name], Dillan Lunden    . STEMI (ST elevation myocardial  infarction) ([place]) 07/29/2015  . Unstable angina ([place]) 11/24/2016  . WEIGHT LOSS, ABNORMAL 04/03/2009   Qualifier: Diagnosis of  By: [name], Willye Javier      Social History   Tobacco Use  . Smoking status: Never  Smoker  . Smokeless tobacco: Never Used  Substance Use Topics  . Alcohol use: Yes    Alcohol/week: 0.0 oz    Comment: rare  . Drug use: No    Family History  Problem Relation Age of Onset  . Hyperlipidemia Mother   . Diabetes Mother        paternal grandparents/1 brother  . Hyperlipidemia Father   . Hypertension Father        paternal grandmother/3 brothers/1 sister  . Arthritis Unknown        mother/father/paternal grandparents  . Breast cancer Maternal Aunt        paternal aunt  . Lung cancer Maternal Aunt   . Heart disease Unknown        parents/maternal grandparents/ 2 brothers  . Stroke Paternal Grandmother   . Mental retardation Sister     No Known Allergies  Health Maintenance  Topic Date Due  . OPHTHALMOLOGY EXAM  05/04/2017  . COLONOSCOPY  07/21/2017  . FOOT EXAM  09/26/2017  . HEMOGLOBIN A1C  01/08/2018  . TETANUS/TDAP  12/03/2025  . INFLUENZA VACCINE  Completed  . Hepatitis C Screening  Completed  . PNA vac Low Risk Adult  Completed    Objective:  Vitals:   07/23/17 0958  BP: 121/66  Pulse: 66  Temp: (!) 97.5 F (36.4 C)  TempSrc: Oral  Weight: 135 lb (61.2 kg)  Height: 5\' 3"  (1.6 m)   Body mass index is 23.91 kg/m.  Physical Exam  Constitutional: He is oriented to person, place, and time.  HENT:  Mouth/Throat: No oropharyngeal exudate.  Eyes: Conjunctivae are normal.  Cardiovascular: Normal rate and regular rhythm.  No murmur heard. Pulmonary/Chest: Effort normal and breath sounds normal.  Abdominal: Soft. He exhibits no mass. There is no tenderness.  Musculoskeletal: Normal range of motion.  Neurological: He is alert and oriented to person, place, and time.  Skin: No rash noted.  Psychiatric: Mood and affect normal.     Lab Results Lab Results  Component Value Date   WBC 4.1 01/05/2017   HGB 11.3 (L) 01/05/2017   HCT 34.2 (L) 01/05/2017   MCV 96.9 01/05/2017   PLT 203 01/05/2017    Lab Results  Component Value Date   CREATININE 1.58 (H) 07/10/2017   BUN 29 (H) 07/10/2017   NA 139 07/10/2017   K 4.6 07/10/2017   CL 108 07/10/2017   CO2 23 07/10/2017    Lab Results  Component Value Date   ALT 18 07/10/2017   AST 19 07/10/2017   ALKPHOS 48 07/10/2017   BILITOT 0.5 07/10/2017    Lab Results  Component Value Date   CHOL 122 01/05/2017   HDL 30 (L) 01/05/2017   LDLCALC 55 01/05/2017   LDLDIRECT 72.0 11/04/2016   TRIG 183 (H) 01/05/2017   CHOLHDL 4.1 01/05/2017   Lab Results  Component Value Date   LABRPR NON REAC 01/05/2017   HIV 1 RNA Quant (copies/mL)  Date Value  07/09/2017 <20 DETECTED (A)  01/05/2017 <20 DETECTED (A)  04/01/2016 <20   HIV-1 RNA Viral Load (no units)  Date Value  07/14/2013 <40  04/13/2013 <40  01/18/2013 <40   CD4 (no units)  Date Value  07/14/2013 514  04/13/2013 498  01/18/2013 522   CD4 T Cell Abs (/uL)  Date Value  07/09/2017 490  01/05/2017 370 (L)  04/01/2016 500     Problem List Items Addressed This Visit      High   Human  immunodeficiency virus (HIV) disease ([place])    His infection remains under excellent, long-term control.  I will continue his current regimen but will work with our pharmacists to see if there are easy ways to simplify his regimen.  He will follow-up after blood work in 6 months.      Relevant Orders   T-helper cell (CD4)- ([facility] clinic only)   HIV 1 RNA quant-no reflex-bld   CBC   Comprehensive metabolic panel   RPR        Michel Bickers, MD [facility] [place] [phone] pager   [phone] cell 07/23/2017, 10:24 AM

## 2017-07-28 ENCOUNTER — Encounter: Payer: Self-pay | Admitting: Internal Medicine

## 2017-08-05 DIAGNOSIS — N189 Chronic kidney disease, unspecified: Secondary | ICD-10-CM | POA: Diagnosis not present

## 2017-08-05 DIAGNOSIS — N183 Chronic kidney disease, stage 3 (moderate): Secondary | ICD-10-CM | POA: Diagnosis not present

## 2017-08-05 DIAGNOSIS — N2581 Secondary hyperparathyroidism of renal origin: Secondary | ICD-10-CM | POA: Diagnosis not present

## 2017-08-07 ENCOUNTER — Encounter: Payer: Self-pay | Admitting: Gastroenterology

## 2017-08-11 DIAGNOSIS — I129 Hypertensive chronic kidney disease with stage 1 through stage 4 chronic kidney disease, or unspecified chronic kidney disease: Secondary | ICD-10-CM | POA: Diagnosis not present

## 2017-08-11 DIAGNOSIS — N2581 Secondary hyperparathyroidism of renal origin: Secondary | ICD-10-CM | POA: Diagnosis not present

## 2017-08-11 DIAGNOSIS — D631 Anemia in chronic kidney disease: Secondary | ICD-10-CM | POA: Diagnosis not present

## 2017-08-11 DIAGNOSIS — N183 Chronic kidney disease, stage 3 (moderate): Secondary | ICD-10-CM | POA: Diagnosis not present

## 2017-08-17 ENCOUNTER — Ambulatory Visit: Payer: Medicare Other | Admitting: Cardiology

## 2017-08-20 ENCOUNTER — Ambulatory Visit: Payer: Medicare Other

## 2017-08-20 ENCOUNTER — Other Ambulatory Visit: Payer: Self-pay | Admitting: Student

## 2017-08-24 ENCOUNTER — Encounter: Payer: Self-pay | Admitting: Internal Medicine

## 2017-09-05 ENCOUNTER — Other Ambulatory Visit: Payer: Self-pay | Admitting: Endocrinology

## 2017-09-06 ENCOUNTER — Other Ambulatory Visit: Payer: Self-pay | Admitting: Adult Health

## 2017-09-08 NOTE — Telephone Encounter (Signed)
Sent to the pharmacy by e-scribe. 

## 2017-09-11 ENCOUNTER — Other Ambulatory Visit: Payer: Self-pay | Admitting: Endocrinology

## 2017-09-14 ENCOUNTER — Other Ambulatory Visit: Payer: Self-pay | Admitting: Endocrinology

## 2017-09-14 ENCOUNTER — Telehealth: Payer: Self-pay | Admitting: Endocrinology

## 2017-09-14 DIAGNOSIS — E1165 Type 2 diabetes mellitus with hyperglycemia: Secondary | ICD-10-CM

## 2017-09-14 NOTE — Telephone Encounter (Signed)
Patient had labs done 07/10/17 but did not come to his appt w/Dr Dwyane Dee on 07/13/17. He has rescheduled his appt w/Dr Dwyane Dee for 10/20/17. I scheduled him for his labs to be done on 10/15/17. Can we get the Lab Orders put back in before patient's lab appt on 10/15/17?

## 2017-09-14 NOTE — Telephone Encounter (Signed)
Please advise 

## 2017-09-29 ENCOUNTER — Other Ambulatory Visit: Payer: Self-pay | Admitting: Endocrinology

## 2017-10-15 ENCOUNTER — Other Ambulatory Visit (INDEPENDENT_AMBULATORY_CARE_PROVIDER_SITE_OTHER): Payer: Medicare Other

## 2017-10-15 DIAGNOSIS — E1165 Type 2 diabetes mellitus with hyperglycemia: Secondary | ICD-10-CM | POA: Diagnosis not present

## 2017-10-15 LAB — BASIC METABOLIC PANEL
BUN: 25 mg/dL — ABNORMAL HIGH (ref 6–23)
CHLORIDE: 110 meq/L (ref 96–112)
CO2: 24 meq/L (ref 19–32)
Calcium: 9.8 mg/dL (ref 8.4–10.5)
Creatinine, Ser: 1.81 mg/dL — ABNORMAL HIGH (ref 0.40–1.50)
GFR: 39.66 mL/min — ABNORMAL LOW (ref >60.00)
Glucose, Bld: 97 mg/dL (ref 70–99)
Potassium: 5.5 mEq/L — ABNORMAL HIGH (ref 3.5–5.1)
SODIUM: 142 meq/L (ref 135–145)

## 2017-10-15 LAB — HEMOGLOBIN A1C: Hgb A1c MFr Bld: 7.1 % — ABNORMAL HIGH (ref 4.6–6.5)

## 2017-10-16 LAB — FRUCTOSAMINE: FRUCTOSAMINE: 273 umol/L (ref 0–285)

## 2017-10-19 ENCOUNTER — Other Ambulatory Visit: Payer: Self-pay | Admitting: Cardiology

## 2017-10-19 NOTE — Telephone Encounter (Signed)
REFILL 

## 2017-10-20 ENCOUNTER — Inpatient Hospital Stay (HOSPITAL_COMMUNITY)
Admission: EM | Admit: 2017-10-20 | Discharge: 2017-10-22 | DRG: 247 | Disposition: A | Discharge status: Home / Self Care | Payer: Medicare Other | Attending: Cardiology | Admitting: Cardiology

## 2017-10-20 ENCOUNTER — Ambulatory Visit: Payer: Medicare Other | Admitting: Endocrinology

## 2017-10-20 ENCOUNTER — Emergency Department (HOSPITAL_COMMUNITY): Payer: Medicare Other

## 2017-10-20 ENCOUNTER — Encounter (HOSPITAL_COMMUNITY): Payer: Self-pay | Admitting: Emergency Medicine

## 2017-10-20 DIAGNOSIS — R079 Chest pain, unspecified: Secondary | ICD-10-CM | POA: Diagnosis not present

## 2017-10-20 DIAGNOSIS — E785 Hyperlipidemia, unspecified: Secondary | ICD-10-CM | POA: Diagnosis present

## 2017-10-20 DIAGNOSIS — I361 Nonrheumatic tricuspid (valve) insufficiency: Secondary | ICD-10-CM | POA: Diagnosis not present

## 2017-10-20 DIAGNOSIS — J449 Chronic obstructive pulmonary disease, unspecified: Secondary | ICD-10-CM | POA: Diagnosis present

## 2017-10-20 DIAGNOSIS — Z955 Presence of coronary angioplasty implant and graft: Secondary | ICD-10-CM

## 2017-10-20 DIAGNOSIS — K219 Gastro-esophageal reflux disease without esophagitis: Secondary | ICD-10-CM | POA: Diagnosis present

## 2017-10-20 DIAGNOSIS — I200 Unstable angina: Secondary | ICD-10-CM | POA: Diagnosis not present

## 2017-10-20 DIAGNOSIS — Z951 Presence of aortocoronary bypass graft: Secondary | ICD-10-CM

## 2017-10-20 DIAGNOSIS — N183 Chronic kidney disease, stage 3 (moderate): Secondary | ICD-10-CM | POA: Diagnosis not present

## 2017-10-20 DIAGNOSIS — Z7984 Long term (current) use of oral hypoglycemic drugs: Secondary | ICD-10-CM | POA: Diagnosis not present

## 2017-10-20 DIAGNOSIS — I129 Hypertensive chronic kidney disease with stage 1 through stage 4 chronic kidney disease, or unspecified chronic kidney disease: Secondary | ICD-10-CM | POA: Diagnosis present

## 2017-10-20 DIAGNOSIS — I214 Non-ST elevation (NSTEMI) myocardial infarction: Principal | ICD-10-CM | POA: Diagnosis present

## 2017-10-20 DIAGNOSIS — I25810 Atherosclerosis of coronary artery bypass graft(s) without angina pectoris: Secondary | ICD-10-CM | POA: Diagnosis present

## 2017-10-20 DIAGNOSIS — I350 Nonrheumatic aortic (valve) stenosis: Secondary | ICD-10-CM | POA: Diagnosis present

## 2017-10-20 DIAGNOSIS — I25110 Atherosclerotic heart disease of native coronary artery with unstable angina pectoris: Secondary | ICD-10-CM | POA: Diagnosis not present

## 2017-10-20 DIAGNOSIS — Z7982 Long term (current) use of aspirin: Secondary | ICD-10-CM

## 2017-10-20 DIAGNOSIS — R072 Precordial pain: Secondary | ICD-10-CM

## 2017-10-20 DIAGNOSIS — E1121 Type 2 diabetes mellitus with diabetic nephropathy: Secondary | ICD-10-CM

## 2017-10-20 DIAGNOSIS — Z21 Asymptomatic human immunodeficiency virus [HIV] infection status: Secondary | ICD-10-CM | POA: Diagnosis present

## 2017-10-20 DIAGNOSIS — Z79899 Other long term (current) drug therapy: Secondary | ICD-10-CM | POA: Diagnosis not present

## 2017-10-20 DIAGNOSIS — E1122 Type 2 diabetes mellitus with diabetic chronic kidney disease: Secondary | ICD-10-CM | POA: Diagnosis present

## 2017-10-20 DIAGNOSIS — I252 Old myocardial infarction: Secondary | ICD-10-CM | POA: Diagnosis not present

## 2017-10-20 DIAGNOSIS — I10 Essential (primary) hypertension: Secondary | ICD-10-CM | POA: Diagnosis not present

## 2017-10-20 DIAGNOSIS — I2510 Atherosclerotic heart disease of native coronary artery without angina pectoris: Secondary | ICD-10-CM | POA: Diagnosis not present

## 2017-10-20 DIAGNOSIS — E1151 Type 2 diabetes mellitus with diabetic peripheral angiopathy without gangrene: Secondary | ICD-10-CM | POA: Diagnosis present

## 2017-10-20 LAB — TROPONIN I
TROPONIN I: 0.09 ng/mL — AB (ref <0.03)
Troponin I: 0.13 ng/mL (ref <0.03)

## 2017-10-20 LAB — CBC
HCT: 37.1 % — ABNORMAL LOW (ref 39.0–52.0)
Hemoglobin: 12.2 g/dL — ABNORMAL LOW (ref 13.0–17.0)
MCH: 31.8 pg (ref 26.0–34.0)
MCHC: 32.9 g/dL (ref 30.0–36.0)
MCV: 96.6 fL (ref 78.0–100.0)
Platelets: 150 10*3/uL (ref 150–400)
RBC: 3.84 MIL/uL — ABNORMAL LOW (ref 4.22–5.81)
RDW: 14.2 % (ref 11.5–15.5)
WBC: 3.7 10*3/uL — ABNORMAL LOW (ref 4.0–10.5)

## 2017-10-20 LAB — BASIC METABOLIC PANEL
Anion gap: 13 (ref 5–15)
BUN: 25 mg/dL — AB (ref 6–20)
CHLORIDE: 105 mmol/L (ref 101–111)
CO2: 19 mmol/L — AB (ref 22–32)
CREATININE: 1.61 mg/dL — AB (ref 0.61–1.24)
Calcium: 8.6 mg/dL — ABNORMAL LOW (ref 8.9–10.3)
GFR calc Af Amer: 49 mL/min — ABNORMAL LOW (ref >60)
GFR calc non Af Amer: 42 mL/min — ABNORMAL LOW (ref >60)
Glucose, Bld: 182 mg/dL — ABNORMAL HIGH (ref 65–99)
Potassium: 4.4 mmol/L (ref 3.5–5.1)
SODIUM: 137 mmol/L (ref 135–145)

## 2017-10-20 LAB — HEPARIN LEVEL (UNFRACTIONATED)

## 2017-10-20 LAB — I-STAT TROPONIN, ED: Troponin i, poc: 0.02 ng/mL (ref 0.00–0.08)

## 2017-10-20 MED ORDER — SODIUM CHLORIDE 0.9% FLUSH
3.0000 mL | Freq: Two times a day (BID) | INTRAVENOUS | Status: DC
  Administered 2017-10-20: 3 mL via INTRAVENOUS

## 2017-10-20 MED ORDER — TICAGRELOR 90 MG PO TABS
90.0000 mg | ORAL_TABLET | Freq: Two times a day (BID) | ORAL | Status: DC
  Administered 2017-10-20 – 2017-10-21 (×2): 90 mg via ORAL
  Filled 2017-10-20 (×2): qty 1

## 2017-10-20 MED ORDER — VALACYCLOVIR HCL 500 MG PO TABS
500.0000 mg | ORAL_TABLET | Freq: Every day | ORAL | Status: DC
Start: 2017-10-21 — End: 2017-10-22
  Administered 2017-10-21 – 2017-10-22 (×2): 500 mg via ORAL
  Filled 2017-10-20 (×2): qty 1

## 2017-10-20 MED ORDER — ACETAMINOPHEN 325 MG PO TABS: 650.0000 mg | ORAL_TABLET | ORAL | Status: DC | PRN

## 2017-10-20 MED ORDER — HEPARIN BOLUS VIA INFUSION
3500.0000 [IU] | Freq: Once | INTRAVENOUS | Status: AC
  Administered 2017-10-20: 3500 [IU] via INTRAVENOUS
  Filled 2017-10-20: qty 3500

## 2017-10-20 MED ORDER — DARUNAVIR-COBICISTAT 800-150 MG PO TABS
1.0000 | ORAL_TABLET | Freq: Every day | ORAL | Status: DC
Start: 2017-10-20 — End: 2017-10-22
  Administered 2017-10-21: 1 via ORAL
  Filled 2017-10-20 (×2): qty 1

## 2017-10-20 MED ORDER — ONDANSETRON HCL 4 MG/2ML IJ SOLN: 4.0000 mg | Freq: Four times a day (QID) | INTRAMUSCULAR | Status: DC | PRN

## 2017-10-20 MED ORDER — ATORVASTATIN CALCIUM 20 MG PO TABS
20.0000 mg | ORAL_TABLET | Freq: Every day | ORAL | Status: DC
Start: 2017-10-20 — End: 2017-10-22
  Administered 2017-10-20 – 2017-10-21 (×2): 20 mg via ORAL
  Filled 2017-10-20 (×2): qty 1

## 2017-10-20 MED ORDER — ASPIRIN 81 MG PO CHEW
81.0000 mg | CHEWABLE_TABLET | ORAL | Status: AC
  Administered 2017-10-21: 81 mg via ORAL

## 2017-10-20 MED ORDER — HEPARIN BOLUS VIA INFUSION
2000.0000 [IU] | Freq: Once | INTRAVENOUS | Status: AC
Start: 2017-10-20 — End: 2017-10-21
  Administered 2017-10-21: 2000 [IU] via INTRAVENOUS
  Filled 2017-10-20: qty 2000

## 2017-10-20 MED ORDER — RILPIVIRINE HCL 25 MG PO TABS
25.0000 mg | ORAL_TABLET | Freq: Every day | ORAL | Status: DC
  Filled 2017-10-20: qty 1

## 2017-10-20 MED ORDER — ASPIRIN 81 MG PO CHEW: 324.0000 mg | CHEWABLE_TABLET | ORAL | Status: AC

## 2017-10-20 MED ORDER — NITROGLYCERIN 0.4 MG SL SUBL
0.4000 mg | SUBLINGUAL_TABLET | SUBLINGUAL | Status: DC | PRN
  Administered 2017-10-20 (×2): 0.4 mg via SUBLINGUAL
  Filled 2017-10-20: qty 1

## 2017-10-20 MED ORDER — SODIUM CHLORIDE 0.9 % WEIGHT BASED INFUSION
1.0000 mL/kg/h | INTRAVENOUS | Status: DC
  Administered 2017-10-20: 1 mL/kg/h via INTRAVENOUS
  Administered 2017-10-21: 250 mL via INTRAVENOUS

## 2017-10-20 MED ORDER — ASPIRIN 81 MG PO CHEW
81.0000 mg | CHEWABLE_TABLET | Freq: Every day | ORAL | Status: DC
  Filled 2017-10-20 (×2): qty 1

## 2017-10-20 MED ORDER — ASPIRIN EC 81 MG PO TBEC: 81.0000 mg | DELAYED_RELEASE_TABLET | Freq: Every day | ORAL | Status: DC

## 2017-10-20 MED ORDER — SODIUM CHLORIDE 0.9 % IV SOLN: 250.0000 mL | INTRAVENOUS | Status: DC | PRN

## 2017-10-20 MED ORDER — ASPIRIN 300 MG RE SUPP
300.0000 mg | RECTAL | Status: AC
  Filled 2017-10-20: qty 1

## 2017-10-20 MED ORDER — DOLUTEGRAVIR SODIUM 50 MG PO TABS
50.0000 mg | ORAL_TABLET | Freq: Every day | ORAL | Status: DC
  Filled 2017-10-20 (×2): qty 1

## 2017-10-20 MED ORDER — NITROGLYCERIN IN D5W 200-5 MCG/ML-% IV SOLN
0.0000 ug/min | INTRAVENOUS | Status: DC
  Administered 2017-10-20: 10 ug/min via INTRAVENOUS
  Filled 2017-10-20: qty 250

## 2017-10-20 MED ORDER — SODIUM CHLORIDE 0.9% FLUSH: 3.0000 mL | INTRAVENOUS | Status: DC | PRN

## 2017-10-20 MED ORDER — ISOSORBIDE MONONITRATE ER 30 MG PO TB24: 30.0000 mg | ORAL_TABLET | Freq: Every day | ORAL | Status: DC

## 2017-10-20 MED ORDER — HEPARIN (PORCINE) IN NACL 100-0.45 UNIT/ML-% IJ SOLN
950.0000 [IU]/h | INTRAMUSCULAR | Status: DC
  Administered 2017-10-20: 700 [IU]/h via INTRAVENOUS
  Filled 2017-10-20: qty 250

## 2017-10-20 MED ORDER — METOPROLOL TARTRATE 12.5 MG HALF TABLET
12.5000 mg | ORAL_TABLET | Freq: Two times a day (BID) | ORAL | Status: DC
  Administered 2017-10-20 – 2017-10-22 (×4): 12.5 mg via ORAL
  Filled 2017-10-20 (×4): qty 1

## 2017-10-20 MED ORDER — DOLUTEGRAVIR-RILPIVIRINE 50-25 MG PO TABS
1.0000 | ORAL_TABLET | Freq: Every day | ORAL | Status: DC
Start: 2017-10-20 — End: 2017-10-20
  Filled 2017-10-20 (×2): qty 1

## 2017-10-20 NOTE — Progress Notes (Signed)
Pt transfer from ED to [facility] around 5 pm, while RN was in pt room getting him settle in, pt complaint of been nauseated and sweaty, when RN asked pt if he had pain, pt say yes and rate CP  7/10 on a 0-10 pain scale,  Cardiology NP paged, vitals  Taken, first nitro given at 5:19 pm, pt rate pain at 5 after first nitro, EKG done show Acute MI, second nitro given at 5:25 PM, pt rate pain at 3 after second nitro, cardiology NP arrive at 5:33 to  See pt, pt then rate pain as 2, Dr Tamala Julian was called by NP, ordered nitro drip and to transfer pt to step down unit due to titrating nitro drip, and active CP. Report called in to [place], RN on [facility]. Pt rate pain at 1/10 during transfer.

## 2017-10-20 NOTE — ED Triage Notes (Signed)
Pt BIB EMS from home. Woke up approx 0030 tonight with substernal sharp CP. Pt took a nitro which relieved his pain, and went back to bed. Pain returned a short time later, and he took another nitro. Called EMS after taking 3rd nitro. Given 324mg  ASA and 1 nitro by EMS. Denies additional s/sx, ambulatory without difficulty for EMS on scene. Hx of DM, MI (last event 12/2016). ECG from EMS shows 1st AV Block, which pt states is new.

## 2017-10-20 NOTE — Progress Notes (Signed)
Echocardiogram 2D Echocardiogram has been performed.  [name] 10/20/2017, 11:06 AM

## 2017-10-20 NOTE — H&P (Addendum)
Cardiology History and Physical:   Patient ID: [name]; [id number]; Jun 20, 1946   Admit date: 10/20/2017 Date of Consult: 10/20/2017  Primary Care Provider: Dorothyann Peng, NP Primary Cardiologist: Christian Ruths, MD  Primary Electrophysiologist:     Patient Profile:   [name] is a 72 y.o. male with a hx of HTN, HLD, CKD stage III, COPD, DM2, HIV, and CAD s/p CABG in 2003 with recent PCI of  He had a recent NSTEMI with PCI to SVG to OM on 12/01/16 who is being seen today for the evaluation of chest pain at the request of Dr. Stanford [name].  History of Present Illness:   [name] has a significant cardiac history and follows with Dr. Ardis [name]. He is s/p CABG in 2003 with subsequent PCI to SVG-PDA 07/2015. He as admitted 11/2016 with chest pain. Heart cath 11/24/2016 which showed severe native CAD with 80% ostial LAD, CTO of proximal left circumflex and proximal RCA. Widely patent LIMA to LAD. Patent SVG to diagonal was stable 80-90% stenosis in the diagonal just distal to the SVG anastomosis, patent SVG to OM1 and OM 2 with 80-90% stenosis in the jump graft immediately distal to the OM1 and the stenosis this is new since 2016. Ostially occluded SVG to PDA with left to left collaterals. Aggressive medical therapy was recommended and he was discharged. However he continued to have chest pain, and eventually underwent PCI to SVG to OM1/OM 2 on 12/01/16. He was discharged on 12/01/16 on 12 months of DAPT.   He was seen by Dr. Stanford [name] in clinic on 06/15/17. He was doing well at that time, no medication changes made.   He presented to [name] 10/20/17 with chest pain. Chest pain located in his [name] chest that felt like a tightness and radiated down his right arm and into his jaw occurred at approximately midnight last night. He woke from sleep to use the bathroom. When he got up he noticed the chest pain and took a nitro SL tablet, which relieved the pain. He was able to go back to sleep. The chest pain  recurred and woke him from sleep. He took another nitro, which decreased his pain and allowed him to go back to sleep. This cycle happened another 1-2 times, prompting him to call EMS. EMS administered a nitro en route, which relieved his chest pain and he has not had a recurrence since arriving at the ED. He had one episode of nausea, but reports no other associated symptoms. He does not recall if this chest pain is similar to his chest pain before his prior MI or PCI. He is compliant on all medications.    Past Medical History:  Diagnosis Date  . Absolute anemia 05/28/2014  . Aortic stenosis 09/17/2015  . Arthritis   . Ascites 11/18/2013  . BELLS PALSY 07/19/2010   Qualifier: Diagnosis of  By: [name], [name]    . CAD (coronary artery disease)    a. s/p CABG in 2003 b. s/p PCI to SVG-PDA in 07/2015 c. 11/2016: cath showing severe native CAD with patent LIMA-LAD and SVG-D1 with 80% stenosis of SVG-OM1-OM2. Initially medical management was recommended --> presented with recurrent angina --> s/p Synergy DES to proximal body of SVG-OM1-OM2, POBA to distal graft.   Marland Kitchen CAD- S/P PCI SVG-OM1 12/01/16 05/12/2006   Qualifier: Diagnosis of  By: [name], [name]    . Carotid bruit 07/10/2015  . Chest pain 11/22/2016  . Chronic kidney disease   . Chronic renal  insufficiency, stage III (moderate) ([place]) 12/02/2016  . Constipation 05/28/2014  . COPD 07/20/2008   Qualifier: Diagnosis of  By: [name], [name]   . DEPRESSION 09/03/2006   Qualifier: Diagnosis of  By: [name], [name]    . Dermatitis 11/27/2012  . Diabetes mellitus   . Diarrhea 11/20/2013  . DM (diabetes mellitus), type 2 with ophthalmic complications ([place]) 3/87/5643   Qualifier: Diagnosis of  By: [name], [name]    . Dry eye syndrome 12/20/2010  . Epicondylitis 06/05/2013   right  . Erectile dysfunction 01/01/2012  . Essential hypertension 05/12/2006   Qualifier: Diagnosis of  By: [name], [name]    . GENITAL HERPES 05/03/2009   Qualifier:  Diagnosis of  By: [name], [name]    . GERD 09/03/2006   Qualifier: Diagnosis of  By: [name], [name]    . GERD (gastroesophageal reflux disease)   . HEARING LOSS, SENSORINEURAL 05/12/2006   Qualifier: Diagnosis of  By: [name], [name]    . HEMATOCHEZIA 04/18/2008   Annotation: 9/09 during bout of constipation Qualifier: Diagnosis of  By: [name], [name]    . HIP PAIN, BILATERAL 07/17/2008   Qualifier: Diagnosis of  By: [name], [name]   . History of depression   . History of kidney stones   . HIV (human immunodeficiency virus infection) ([place]) 1991   on meds since initial dx.   Marland Kitchen HLD (hyperlipidemia) 11/19/2013  . Human immunodeficiency virus (HIV) disease ([place]) 05/12/2006   Qualifier: Diagnosis of  By: [name], [name]    . Hx of CABG 09/03/2006   Annotation: 2003 Qualifier: Diagnosis of  By: [name], [name]    . Hyperkalemia 03/23/2014  . Hyperlipidemia   . Hypertension   . Hyponatremia 03/23/2014  . INGUINAL LYMPHADENOPATHY, RIGHT 04/03/2009   Qualifier: Diagnosis of  By: [name], [name]    . Keratoma 01/24/2015  . KNEE PAIN, BILATERAL 07/17/2008   Qualifier: Diagnosis of  By: [name], [name]   . Lesion of breast 07/21/2015  . Lipodystrophy 12/20/2010  . Memory loss 09/18/2008   Qualifier: Diagnosis of  By: [name], [name]   . Metatarsal deformity 01/24/2015  . Nasal abscess 12/04/2015  . Night sweats 07/06/2012  . Nocturia 03/06/2013  . NSTEMI (non-ST elevated myocardial infarction) ([place]) 11/29/2016  . Pain in joint, ankle and foot 01/19/2015  . Pancreatitis 11/2013   attributed to HIV meds.   . Pedal edema 05/21/2014  . PERIPHERAL VASCULAR DISEASE 07/20/2008   Qualifier: Diagnosis of  By: [name], [name]   . Posterior cervical lymphadenopathy 03/30/2014  . Protein-calorie malnutrition, severe ([place]) 03/23/2014  . SHINGLES, HX OF 05/03/2009   Annotation: R leg Qualifier: Diagnosis of  By: [name], [name]    . STEMI (ST elevation myocardial infarction) ([place]) 07/29/2015  .  Unstable angina ([place]) 11/24/2016  . WEIGHT LOSS, ABNORMAL 04/03/2009   Qualifier: Diagnosis of  By: [name], [name]      Past Surgical History:  Procedure Laterality Date  . CARDIAC CATHETERIZATION N/A 07/29/2015   Procedure: Left Heart Cath and Coronary Angiography;  Surgeon: [name], MD;  Location: [place] [facility];  Service: Cardiovascular;  Laterality: N/A;  . CARDIAC CATHETERIZATION N/A 07/29/2015   Procedure: Coronary Stent Intervention;  Surgeon: [name], MD;  Location: [place] [facility];  Service: Cardiovascular;  Laterality: N/A;  . CORONARY ARTERY BYPASS GRAFT  05/2002  . CORONARY STENT INTERVENTION N/A 12/01/2016  Procedure: Coronary Stent Intervention;  Surgeon: [name], MD;  Location: [place] [facility];  Service: Cardiovascular;  Laterality: N/A;  . LEFT HEART CATH AND CORS/GRAFTS ANGIOGRAPHY N/A 11/24/2016   Procedure: Left Heart Cath and Cors/Grafts Angiography;  Surgeon: Nelva Bush, MD;  Location: [place] [facility];  Service: Cardiovascular;  Laterality: N/A;  . LEFT HEART CATH AND CORS/GRAFTS ANGIOGRAPHY N/A 12/01/2016   Procedure: Left Heart Cath and Cors/Grafts Angiography;  Surgeon: [name], MD;  Location: [place] [facility];  Service: Cardiovascular;  Laterality: N/A;  . VASECTOMY       Home Medications:  Prior to Admission medications   Medication Sig Start Date End Date Taking? Authorizing Provider  acetaminophen (TYLENOL) 325 MG tablet Take 2 tablets (650 mg total) by mouth every 4 (four) hours as needed for headache or mild pain. 12/02/16   Erlene Quan, PA-C  aspirin 81 MG chewable tablet Chew 81 mg by mouth daily.    [provider]  atorvastatin (LIPITOR) 20 MG tablet Take 1 tablet (20 mg total) by mouth daily. 07/06/17   Michel Bickers, MD  b complex vitamins tablet Take 1 tablet by mouth daily. 09/25/16   Mosie Lukes, MD  B-D UF III MINI PEN NEEDLES 31G X 5 MM MISC USE TWICE DAILY AS DIRECTED 12/03/16    Renato Shin, MD  Cholecalciferol 2000 units CAPS Take 1 capsule (2,000 Units total) by mouth daily. 09/25/16   Mosie Lukes, MD  darunavir-cobicistat (PREZCOBIX) 800-150 MG tablet Take 1 tablet by mouth daily with supper. Swallow whole. Do NOT crush, break or chew tablets. Take with food. 07/23/17   Michel Bickers, MD  diclofenac (VOLTAREN) 50 MG EC tablet TAKE 1 TABLET(50 MG) BY MOUTH TWICE DAILY 06/25/16   Mosie Lukes, MD  Dolutegravir-Rilpivirine (JULUCA) 50-25 MG TABS Take 1 tablet by mouth daily with supper. 07/23/17   Michel Bickers, MD  fluticasone [name] [place]) 50 MCG/ACT nasal spray Place 2 sprays into both nostrils daily. Patient not taking: Reported on 07/23/2017 02/03/17   Christian Peng, NP  FREESTYLE TEST STRIPS test strip USE TO CHECK BLOOD SUGAR THREE TIMES DAILY 02/19/17   Nafziger, Tommi Rumps, NP  furosemide (LASIX) 20 MG tablet TAKE 2 TABLETS BY MOUTH DAILY AS NEEDED FOR FLUID RETENTION OR SWELLING Patient taking differently: TAKE 1 TABLETS BY MOUTH DAILY AS NEEDED FOR FLUID RETENTION OR SWELLING 02/05/17   Nafziger, Tommi Rumps, NP  isosorbide mononitrate (IMDUR) 30 MG 24 hr tablet TAKE 1 TABLET(30 MG) BY MOUTH DAILY 08/20/17   Lelon Perla, MD  Lancets (FREESTYLE) lancets Use as directed three times a day to check blood sugar.  DX E11.9 02/25/16   Mosie Lukes, MD  lisinopril (PRINIVIL,ZESTRIL) 5 MG tablet TAKE 1/2 TABLET BY MOUTH DAILY 03/04/17   Nafziger, Tommi Rumps, NP  metFORMIN (GLUCOPHAGE-XR) 500 MG 24 hr tablet TAKE 2 TABLETS(1000 MG) BY MOUTH DAILY WITH SUPPER 09/12/17   Elayne Snare, MD  metoprolol tartrate (LOPRESSOR) 25 MG tablet Take 0.5 tablets (12.5 mg total) by mouth 2 (two) times daily. 11/25/16   Strader, Fransisco Hertz, PA-C  neomycin-polymyxin-hydrocortisone (CORTISPORIN) otic solution INSTILL 3 DROPS IN BOTH EARS THREE TIMES DAILY 07/07/16   Mosie Lukes, MD  nitroGLYCERIN (NITROSTAT) 0.4 MG SL tablet Place 1 tablet (0.4 mg total) under the tongue every 5 (five) minutes as needed  for chest pain. 09/29/16   Lelon Perla, MD  nitroGLYCERIN (NITROSTAT) 0.4 MG SL tablet PLACE 1 TABLET UNDER THE TONGUE EVERY  5 MINUTES AS NEEDED FOR CHEST PAIN 10/19/17   Lelon Perla, MD  Omega-3 Fatty Acids (FISH OIL) 1000 MG CAPS Take by mouth. Takes 2 daily    [provider]  ondansetron (ZOFRAN) 4 MG tablet Take 1 tablet (4 mg total) by mouth every 8 (eight) hours as needed for nausea or vomiting. 01/13/17   Nafziger, Tommi Rumps, NP  pantoprazole (PROTONIX) 40 MG tablet Take 1 tablet (40 mg total) by mouth daily. 12/02/16   Erlene Quan, PA-C  pioglitazone (ACTOS) 15 MG tablet TAKE 1 TABLET(15 MG) BY MOUTH DAILY 02/09/17   Elayne Snare, MD  potassium chloride SA (K-DUR,KLOR-CON) 20 MEQ tablet TAKE 1 TABLET BY MOUTH THREE TIMES DAILY 09/08/17   Christian Peng, NP  Probiotic Product (PROBIOTIC DAILY) CAPS Take 1 by mouth daily 09/25/16   Mosie Lukes, MD  repaglinide (PRANDIN) 2 MG tablet TAKE 2 TABLETS BY MOUTH BEFORE BREAKFAST, 1 TABLET BY MOUTH BEFORE LUNCH AND 2 TABLETS BY MOUTH BEFORE SUPPER 09/29/17   Elayne Snare, MD  sodium bicarbonate 650 MG tablet Take 650 mg by mouth 3 (three) times daily.    [provider]  ticagrelor (BRILINTA) 90 MG TABS tablet Take 1 tablet (90 mg total) by mouth 2 (two) times daily. 12/02/16   Erlene Quan, PA-C  TOUJEO SOLOSTAR 300 UNIT/ML SOPN INJECT 10 UNITS UNDER THE SKIN DAILY 07/06/17   Elayne Snare, MD  triamcinolone cream (KENALOG) 0.1 % APP TO DRY ITCHY SKIN BID PRN 10/15/15   [provider]  valACYclovir (VALTREX) 500 MG tablet TAKE 1 TABLET BY MOUTH DAILY 06/19/17   Michel Bickers, MD    Inpatient Medications: Scheduled Meds:  Continuous Infusions:  PRN Meds:   Allergies:   No Known Allergies  Social History:   Social History   Socioeconomic History  . Marital status: Divorced    Spouse name: Not on file  . Number of children: 4  . Years of education: Not on file  . Highest education level: Not on file  Social  Needs  . Financial resource strain: Not on file  . Food insecurity - worry: Not on file  . Food insecurity - inability: Not on file  . Transportation needs - medical: Not on file  . Transportation needs - non-medical: Not on file  Occupational History  . 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   Tobacco Use  . Smoking status: Never Smoker  . Smokeless tobacco: Never Used  Substance and Sexual Activity  . Alcohol use: Yes    Alcohol/week: 0.0 oz    Comment: rare  . Drug use: No  . Sexual activity: Not Currently    Comment: declined condoms  Other Topics Concern  . Not on file  Social History Narrative   Lives alone.  Supportive friends and family.  His HIV Dx is not a secret.     Family History:    Family History  Problem Relation Age of Onset  . Hyperlipidemia Mother   . Diabetes Mother        paternal grandparents/1 brother  . Hyperlipidemia Father   . Hypertension Father        paternal grandmother/3 brothers/1 sister  . Arthritis Unknown        mother/father/paternal grandparents  . Breast cancer Maternal Aunt        paternal aunt  . Lung cancer Maternal Aunt   . Heart disease Unknown  parents/maternal grandparents/ 2 brothers  . Stroke Paternal Grandmother   . Mental retardation Sister      ROS:  Please see the history of present illness.   All other ROS reviewed and negative.     Physical Exam/Data:   Vitals:   10/20/17 0628 10/20/17 0700 10/20/17 0730 10/20/17 0800  BP: (!) 164/80 (!) 151/84 (!) 151/75 123/75  Pulse: 66 62 63 64  Resp: 19 14 14 14   Temp: 97.9 F (36.6 C)     TempSrc: Oral     SpO2: 99% 99% 99% 99%  Weight:      Height:       No intake or output data in the 24 hours ending 10/20/17 0835 Filed Weights   10/20/17 0625  Weight: 135 lb (61.2 kg)   Body mass index is 23.91 kg/m.  General:  Well nourished, well developed, in no acute distress HEENT: normal Neck: no  JVD Vascular: No carotid bruits Cardiac:  normal S1, S2; RRR; ? murmur  Lungs:  clear to auscultation bilaterally, no wheezing, rhonchi or rales  Abd: soft, nontender, no hepatomegaly  Ext: no edema Musculoskeletal:  No deformities, BUE and BLE strength normal and equal Skin: warm and dry  Neuro:  CNs 2-12 intact, no focal abnormalities noted Psych:  Normal affect   EKG:  The EKG was personally reviewed and demonstrates:  Sinus, ST depression TWI in V5/6 Telemetry:  Telemetry was personally reviewed and demonstrates:  sinus  Relevant CV Studies:  Heart cath 12/01/16:  Prox RCA to Mid RCA lesion, 100 %stenosed.  Ost Cx to Prox Cx lesion, 100 %stenosed.  Mid LAD lesion, 80 %stenosed.  Mid Graft lesion, 50 %stenosed.  2nd Diag lesion, 90 %stenosed.  Prox Graft lesion, 99 %stenosed.  Post intervention, there is a 0% residual stenosis.  Prox Graft lesion, 70 %stenosed.  Post intervention, there is a 0% residual stenosis.  A stent was successfully placed.  IMPRESSION: Successful circumflex obtuse marginal branch sequential graft to the ramus and OM branches with stenting of a thrombotic ulcerated proximal stenosis and angioplasty of the continuing anastomosis at the ramus branch with excellent angiographic result. I suspect this was due to the "culprit lesion" responsible for the non-STEMI. The sheath will be removed and pressure held. A total of 125 mL of contrast was administered to the patient. He'll be hydrated overnight and potentially discharged home in the morning.  Diagnostic Diagram       Post-Intervention Diagram           Laboratory Data:  Chemistry Recent Labs  Lab 10/15/17 0819 10/20/17 0656  NA 142 137  K 5.5* 4.4  CL 110 105  CO2 24 19*  GLUCOSE 97 182*  BUN 25* 25*  CREATININE 1.81* 1.61*  CALCIUM 9.8 8.6*  GFRNONAA  --  42*  GFRAA  --  49*  ANIONGAP  --  13    No results for input(s): PROT, ALBUMIN, AST, ALT, ALKPHOS, BILITOT in the  last 168 hours. Hematology Recent Labs  Lab 10/20/17 0656  WBC 3.7*  RBC 3.84*  HGB 12.2*  HCT 37.1*  MCV 96.6  MCH 31.8  MCHC 32.9  RDW 14.2  PLT 150   Cardiac EnzymesNo results for input(s): TROPONINI in the last 168 hours.  Recent Labs  Lab 10/20/17 0700  TROPIPOC 0.02    BNPNo results for input(s): BNP, PROBNP in the last 168 hours.  DDimer No results for input(s): DDIMER in the last 168 hours.  Radiology/Studies:  Dg Chest 2 View  Result Date: 10/20/2017 CLINICAL DATA:  Chest pain. EXAM: CHEST - 2 VIEW COMPARISON:  05/05/2017 FINDINGS: Post median sternotomy.The cardiomediastinal contours are normal. Coronary artery calcifications or stents. The lungs are clear. Pulmonary vasculature is normal. No consolidation, pleural effusion, or pneumothorax. No acute osseous abnormalities are seen. IMPRESSION: Post median sternotomy without acute abnormality. Electronically Signed   By: Jeb Levering M.D.   On: 10/20/2017 06:58    Assessment and Plan:   1. Chest pain Troponin POC 0.02 EKG with ST depression/TWI in V5/V6.  Given his complex CAD history, will review last cath with attending to determine if we treat with medical management vs repeat heart catheterization tomorrow. Continue ASA and brilinta, continue statin. Will admit and hydrate overnight and cath tomorrow.  2. HTN Pressures are elevated. He has not taken morning medications. Home regimen includes imdur, lisinopril, lopressor. Will restart ACEI and BB. Follow pressure.  3. HLD Continue lipitor 20 mg.  01/05/2017: Cholesterol 122; HDL 30; LDL Cholesterol 55; Triglycerides 183; VLDL 37   4. DM SSI. Last A1c 7.1% on 10/15/17.   5. CKD stage III sCr on admission was 1.61. His baseline creatinine appears to be 1.45-1.81.     For questions or updates, please contact [place] Please consult [email or web address] for contact info under Cardiology/STEMI.   Signed, Tami Lin Duke, PA  10/20/2017 8:35 AM As  above, patient seen and examined.  Briefly he is a 72 year old male with past medical history of coronary artery disease status post coronary artery bypass and graft, hypertension, hyperlipidemia, chronic stage III kidney disease, HIV, COPD with unstable angina.  Patient had last intervention in April 2018 with PCI of the saphenous vein graft to his first and second marginal.  Patient has occasional chest pain at home.  However he awoke this morning to go to the bathroom and developed substernal chest pain described as sharp.  Similar but less severe than his prior cardiac pain.  He took a nitroglycerin with improvement.  However the pain continued to recur and he had episode radiating to his jaw and also right upper extremity.  There was no associated dyspnea, diaphoresis but there was mild nausea.  He presented to the emergency room and is presently pain-free.  Electrocardiogram shows sinus rhythm, left ventricular hypertrophy, inferior infarct, lateral ST-T wave changes.  It is unchanged compared to previous.  Initial enzymes negative.  Creatinine 1.61.  1 unstable angina-symptoms are concerning.  They have been progressive over the past 12 hours but presently pain-free.  Plan to admit and rule out myocardial infarction with serial enzymes.  Treat with aspirin and continue Brilinta.  Continue statin.  Add IV heparin.  Plan cardiac catheterization tomorrow.  We will hydrate prior to the procedure and limit dye.  Check echocardiogram for LV function.  The risks and benefits of catheterization including myocardial infarction, CVA, death and worsening renal function discussed and he agrees to proceed.  2 hyperlipidemia-continue statin.  3 chronic stage III kidney disease-follow renal function closely after procedure.  Hydrate prior to procedure and limit dye.  Hold ACEI.  4 Hypertension-blood pressure is mildly elevated.  Continue preadmission medications and follow.  Advance as needed.  5 HIV-continue  preadmission meds.  Christian Ruths, MD

## 2017-10-20 NOTE — Progress Notes (Addendum)
  The patient has been seen in conjunction with Daune Perch, AGNP-C. All aspects of care have been considered and discussed. The patient has been personally interviewed, examined, and all clinical data has been reviewed.   Initial ECG had ischemic change. Upon my review and exam, the patient was essentially pain free.  Plan IV NTG for BP control and improved coronary flow. Cycle markers.  Repeat ECG back to baseline at 1830.  Plan cath tomorrow as previously scheduled.  I was called by nurse for active chest pain. Upon evaluation the patient says he developed substernal sharp intense chest pressure that radiated to his neck and was associated with mild dyspnea, diaphoresis, nausea and lightheadedness. 7/10. This was worse than the chest pain that he presented with and reminded him of his previous MI. EKG shows mild ST elevation in leads III and AVF and ST depression/TWI in V3-6, slightly worse than previous EKG this am.   Pain decreased from 7 to 5 after SL NTG X1 and down to 2/10 after 2nd SL NTG. Currently pain hovering at level 1-2.   Reviewed with STEMI MD, Dr. Tamala Julian who is here to evaluate pt.  Plan to transfer to stepdown and start IV NTG. Will start NTG at 51mcg/hr until gets to stepdown. Will recheck EKG 10 minutes after NTG started. Continue with plans for cath tomorrow.   Daune Perch, AGNP-C [facility] 10/20/2017  6:03 PM Pager: [phone]

## 2017-10-20 NOTE — ED Notes (Signed)
Patient transported to X-ray 

## 2017-10-20 NOTE — Progress Notes (Addendum)
ANTICOAGULATION CONSULT NOTE  Pharmacy Consult for heparin Indication: chest pain/ACS  No Known Allergies  Patient Measurements: Height: 5\' 3"  (160 cm) Weight: 135 lb (61.2 kg) IBW/kg (Calculated) : 56.9 Heparin Dosing Weight: 61 kg  Vital Signs: Temp: 98.1 F (36.7 C) (03/19 2038) Temp Source: Oral (03/19 2038) BP: 143/78 (03/19 2038) Pulse Rate: 80 (03/19 2204)  Labs: Recent Labs    10/20/17 0656 10/20/17 1703 10/20/17 1836 10/20/17 2149  HGB 12.2*  --   --   --   HCT 37.1*  --   --   --   PLT 150  --   --   --   HEPARINUNFRC  --   --   --  <0.10*  CREATININE 1.61*  --   --   --   TROPONINI  --  0.09* 0.13*  --     Estimated Creatinine Clearance: 34.9 mL/min (A) (by C-G formula based on SCr of 1.61 mg/dL (H)).  Assessment: 72 y.o. male with chest pain for heparin   Goal of Therapy:  Heparin level 0.3-0.7 units/ml Monitor platelets by anticoagulation protocol: Yes   Plan:  Heparin 2000 units IV bolus, then increase heparin  950 units/hr Follow-up am labs.   Phillis Knack, PharmD, BCPS  10/20/2017 11:06 PM  Addendum: Repeat level 0.34   No change to heparin for now   Follow up after cath today   Phillis Knack, PharmD, BCPS 10/21/2017 2:55 AM

## 2017-10-20 NOTE — Progress Notes (Signed)
Nurse taking care of pt, [name],  notified me he was having 7 out of 10 chest pain. EKG was obtained, NP [facility] paged, nitroglycerin given twice. EKG was abnormal. Chest pain came down to a 5 after first nitro and 2 after second nitro. NP contacted Dr. Tamala Julian. Dr. Tamala Julian came up here and we started nitro, waited 10 minutes and got another EKG per Dr. Tamala Julian.  Pt was transferred to stepdown 6E.

## 2017-10-20 NOTE — ED Notes (Signed)
ED Provider at bedside. 

## 2017-10-20 NOTE — ED Notes (Signed)
Transport to [employer] via Biomedical scientist.

## 2017-10-20 NOTE — ED Provider Notes (Signed)
[facility] EMERGENCY DEPARTMENT Provider Note   CSN: [id number] Arrival date & time: 10/20/17  7782     History   Chief Complaint Chief Complaint  Patient presents with  . Chest Pain    HPI [name] is a 72 y.o. male.  Patient with hx cad/cabg, c/o mid chest pain onset last night at rest. A few episodes, each lasting several minutes and generally relieved w ntg. Pt has noted increased episodes of cp/cp w exertion in the past week. Similar to, but not nearly as bad or prolonged as chest pain w prior MI. Denies any current chest pain. Pain was dull, mid chest, not radiating. +mild sob. Mild nausea. No vomiting. No diaphoresis. Denies cough or uri symptoms. No fever or chills.    The history is provided by the patient.  Chest Pain   Pertinent negatives include no abdominal pain, no cough, no fever, no headaches and no shortness of breath.    Past Medical History:  Diagnosis Date  . Absolute anemia 05/28/2014  . Aortic stenosis 09/17/2015  . Arthritis   . Ascites 11/18/2013  . BELLS PALSY 07/19/2010   Qualifier: Diagnosis of  By: [name], [name]    . CAD (coronary artery disease)    a. s/p CABG in 2003 b. s/p PCI to SVG-PDA in 07/2015 c. 11/2016: cath showing severe native CAD with patent LIMA-LAD and SVG-D1 with 80% stenosis of SVG-OM1-OM2. Initially medical management was recommended --> presented with recurrent angina --> s/p Synergy DES to proximal body of SVG-OM1-OM2, POBA to distal graft.   Marland Kitchen CAD- S/P PCI SVG-OM1 12/01/16 05/12/2006   Qualifier: Diagnosis of  By: [name], [name]    . Carotid bruit 07/10/2015  . Chest pain 11/22/2016  . Chronic kidney disease   . Chronic renal insufficiency, stage III (moderate) ([place]) 12/02/2016  . Constipation 05/28/2014  . COPD 07/20/2008   Qualifier: Diagnosis of  By: [name], [name]   . DEPRESSION 09/03/2006   Qualifier: Diagnosis of  By: [name], [name]    . Dermatitis 11/27/2012  . Diabetes mellitus   .  Diarrhea 11/20/2013  . DM (diabetes mellitus), type 2 with ophthalmic complications ([place]) 11/25/5359   Qualifier: Diagnosis of  By: [name], [name]    . Dry eye syndrome 12/20/2010  . Epicondylitis 06/05/2013   right  . Erectile dysfunction 01/01/2012  . Essential hypertension 05/12/2006   Qualifier: Diagnosis of  By: [name], [name]    . GENITAL HERPES 05/03/2009   Qualifier: Diagnosis of  By: [name], [name]    . GERD 09/03/2006   Qualifier: Diagnosis of  By: [name], [name]    . GERD (gastroesophageal reflux disease)   . HEARING LOSS, SENSORINEURAL 05/12/2006   Qualifier: Diagnosis of  By: [name], [name]    . HEMATOCHEZIA 04/18/2008   Annotation: 9/09 during bout of constipation Qualifier: Diagnosis of  By: [name], [name]    . HIP PAIN, BILATERAL 07/17/2008   Qualifier: Diagnosis of  By: [name], [name]   . History of depression   . History of kidney stones   . HIV (human immunodeficiency virus infection) ([place]) 1991   on meds since initial dx.   Marland Kitchen HLD (hyperlipidemia) 11/19/2013  . Human immunodeficiency virus (HIV) disease ([place]) 05/12/2006   Qualifier: Diagnosis of  By: [name], [name]    . Hx of CABG 09/03/2006   Annotation: 2003 Qualifier: Diagnosis of  By: [name], [name]    .  Hyperkalemia 03/23/2014  . Hyperlipidemia   . Hypertension   . Hyponatremia 03/23/2014  . INGUINAL LYMPHADENOPATHY, RIGHT 04/03/2009   Qualifier: Diagnosis of  By: [name], [name]    . Keratoma 01/24/2015  . KNEE PAIN, BILATERAL 07/17/2008   Qualifier: Diagnosis of  By: [name], [name]   . Lesion of breast 07/21/2015  . Lipodystrophy 12/20/2010  . Memory loss 09/18/2008   Qualifier: Diagnosis of  By: [name], [name]   . Metatarsal deformity 01/24/2015  . Nasal abscess 12/04/2015  . Night sweats 07/06/2012  . Nocturia 03/06/2013  . NSTEMI (non-ST elevated myocardial infarction) ([place]) 11/29/2016  . Pain in joint, ankle and foot 01/19/2015  . Pancreatitis 11/2013   attributed to HIV meds.   .  Pedal edema 05/21/2014  . PERIPHERAL VASCULAR DISEASE 07/20/2008   Qualifier: Diagnosis of  By: [name], [name]   . Posterior cervical lymphadenopathy 03/30/2014  . Protein-calorie malnutrition, severe ([place]) 03/23/2014  . SHINGLES, HX OF 05/03/2009   Annotation: R leg Qualifier: Diagnosis of  By: [name], [name]    . STEMI (ST elevation myocardial infarction) ([place]) 07/29/2015  . Unstable angina ([place]) 11/24/2016  . WEIGHT LOSS, ABNORMAL 04/03/2009   Qualifier: Diagnosis of  By: [name], [name]      Patient Active Problem List   Diagnosis Date Noted  . Chronic renal insufficiency, stage III (moderate) ([place]) 12/02/2016  . NSTEMI (non-ST elevated myocardial infarction) ([place]) 11/29/2016  . Unstable angina ([place]) 11/24/2016  . Chest pain 11/22/2016  . Aortic stenosis 09/17/2015  . STEMI (ST elevation myocardial infarction) ([place]) 07/29/2015  . Lesion of breast 07/21/2015  . Carotid bruit 07/10/2015  . Metatarsal deformity 01/24/2015  . Keratoma 01/24/2015  . Pain in joint, ankle and foot 01/19/2015  . Absolute anemia 05/28/2014  . Constipation 05/28/2014  . Pedal edema 05/21/2014  . Posterior cervical lymphadenopathy 03/30/2014  . Hyperkalemia 03/23/2014  . Hyponatremia 03/23/2014  . Protein-calorie malnutrition, severe ([place]) 03/23/2014  . Diarrhea 11/20/2013  . HLD (hyperlipidemia) 11/19/2013  . Ascites 11/18/2013  . Pancreatitis 11/15/2013  . Epicondylitis 06/05/2013  . Nocturia 03/06/2013  . Dermatitis 11/27/2012  . Night sweats 07/06/2012  . Erectile dysfunction 01/01/2012  . Lipodystrophy 12/20/2010  . Dry eye syndrome 12/20/2010  . BELLS PALSY 07/19/2010  . GENITAL HERPES 05/03/2009  . SHINGLES, HX OF 05/03/2009  . WEIGHT LOSS, ABNORMAL 04/03/2009  . INGUINAL LYMPHADENOPATHY, RIGHT 04/03/2009  . MEMORY LOSS 09/18/2008  . PERIPHERAL VASCULAR DISEASE 07/20/2008  . COPD 07/20/2008  . HIP PAIN, BILATERAL 07/17/2008  . KNEE PAIN, BILATERAL 07/17/2008  . HEMATOCHEZIA  04/18/2008  . NEPHROLITHIASIS, HX OF 02/22/2008  . DM (diabetes mellitus), type 2 with ophthalmic complications ([place]) 16/02/3709  . DEPRESSION 09/03/2006  . GERD 09/03/2006  . Hx of CABG 09/03/2006  . Human immunodeficiency virus (HIV) disease ([place]) 05/12/2006  . HEARING LOSS, SENSORINEURAL 05/12/2006  . Essential hypertension 05/12/2006  . CAD- S/P PCI SVG-OM1 12/01/16 05/12/2006    Past Surgical History:  Procedure Laterality Date  . CARDIAC CATHETERIZATION N/A 07/29/2015   Procedure: Left Heart Cath and Coronary Angiography;  Surgeon: [name], MD;  Location: [facility];  Service: Cardiovascular;  Laterality: N/A;  . CARDIAC CATHETERIZATION N/A 07/29/2015   Procedure: Coronary Stent Intervention;  Surgeon: [name], MD;  Location: [facility];  Service: Cardiovascular;  Laterality: N/A;  . CORONARY ARTERY BYPASS GRAFT  05/2002  . CORONARY STENT INTERVENTION N/A 12/01/2016   Procedure:  Coronary Stent Intervention;  Surgeon: [name], MD;  Location: [facility];  Service: Cardiovascular;  Laterality: N/A;  . LEFT HEART CATH AND CORS/GRAFTS ANGIOGRAPHY N/A 11/24/2016   Procedure: Left Heart Cath and Cors/Grafts Angiography;  Surgeon: Nelva Bush, MD;  Location: [facility];  Service: Cardiovascular;  Laterality: N/A;  . LEFT HEART CATH AND CORS/GRAFTS ANGIOGRAPHY N/A 12/01/2016   Procedure: Left Heart Cath and Cors/Grafts Angiography;  Surgeon: [name], MD;  Location: [facility];  Service: Cardiovascular;  Laterality: N/A;  . VASECTOMY         Home Medications    Prior to Admission medications   Medication Sig Start Date End Date Taking? Authorizing Provider  acetaminophen (TYLENOL) 325 MG tablet Take 2 tablets (650 mg total) by mouth every 4 (four) hours as needed for headache or mild pain. 12/02/16   Erlene Quan, PA-C  aspirin 81 MG chewable tablet Chew 81 mg by mouth daily.    [provider]    atorvastatin (LIPITOR) 20 MG tablet Take 1 tablet (20 mg total) by mouth daily. 07/06/17   Michel Bickers, MD  b complex vitamins tablet Take 1 tablet by mouth daily. 09/25/16   Mosie Lukes, MD  B-D UF III MINI PEN NEEDLES 31G X 5 MM MISC USE TWICE DAILY AS DIRECTED 12/03/16   Renato Shin, MD  Cholecalciferol 2000 units CAPS Take 1 capsule (2,000 Units total) by mouth daily. 09/25/16   Mosie Lukes, MD  darunavir-cobicistat (PREZCOBIX) 800-150 MG tablet Take 1 tablet by mouth daily with supper. Swallow whole. Do NOT crush, break or chew tablets. Take with food. 07/23/17   Michel Bickers, MD  diclofenac (VOLTAREN) 50 MG EC tablet TAKE 1 TABLET(50 MG) BY MOUTH TWICE DAILY 06/25/16   Mosie Lukes, MD  Dolutegravir-Rilpivirine (JULUCA) 50-25 MG TABS Take 1 tablet by mouth daily with supper. 07/23/17   Michel Bickers, MD  fluticasone [facility]) 50 MCG/ACT nasal spray Place 2 sprays into both nostrils daily. Patient not taking: Reported on 07/23/2017 02/03/17   Dorothyann Peng, NP  FREESTYLE TEST STRIPS test strip USE TO CHECK BLOOD SUGAR THREE TIMES DAILY 02/19/17   Nafziger, Tommi Rumps, NP  furosemide (LASIX) 20 MG tablet TAKE 2 TABLETS BY MOUTH DAILY AS NEEDED FOR FLUID RETENTION OR SWELLING Patient taking differently: TAKE 1 TABLETS BY MOUTH DAILY AS NEEDED FOR FLUID RETENTION OR SWELLING 02/05/17   Nafziger, Tommi Rumps, NP  isosorbide mononitrate (IMDUR) 30 MG 24 hr tablet TAKE 1 TABLET(30 MG) BY MOUTH DAILY 08/20/17   Lelon Perla, MD  Lancets (FREESTYLE) lancets Use as directed three times a day to check blood sugar.  DX E11.9 02/25/16   Mosie Lukes, MD  lisinopril (PRINIVIL,ZESTRIL) 5 MG tablet TAKE 1/2 TABLET BY MOUTH DAILY 03/04/17   Nafziger, Tommi Rumps, NP  metFORMIN (GLUCOPHAGE-XR) 500 MG 24 hr tablet TAKE 2 TABLETS(1000 MG) BY MOUTH DAILY WITH SUPPER 09/12/17   Elayne Snare, MD  metoprolol tartrate (LOPRESSOR) 25 MG tablet Take 0.5 tablets (12.5 mg total) by mouth 2 (two) times daily. 11/25/16   Strader,  Fransisco Hertz, PA-C  neomycin-polymyxin-hydrocortisone (CORTISPORIN) otic solution INSTILL 3 DROPS IN BOTH EARS THREE TIMES DAILY 07/07/16   Mosie Lukes, MD  nitroGLYCERIN (NITROSTAT) 0.4 MG SL tablet Place 1 tablet (0.4 mg total) under the tongue every 5 (five) minutes as needed for chest pain. 09/29/16   Lelon Perla, MD  nitroGLYCERIN (NITROSTAT) 0.4 MG SL tablet PLACE 1 TABLET  UNDER THE TONGUE EVERY 5 MINUTES AS NEEDED FOR CHEST PAIN 10/19/17   Lelon Perla, MD  Omega-3 Fatty Acids (FISH OIL) 1000 MG CAPS Take by mouth. Takes 2 daily    [provider]  ondansetron (ZOFRAN) 4 MG tablet Take 1 tablet (4 mg total) by mouth every 8 (eight) hours as needed for nausea or vomiting. 01/13/17   Nafziger, Tommi Rumps, NP  pantoprazole (PROTONIX) 40 MG tablet Take 1 tablet (40 mg total) by mouth daily. 12/02/16   Erlene Quan, PA-C  pioglitazone (ACTOS) 15 MG tablet TAKE 1 TABLET(15 MG) BY MOUTH DAILY 02/09/17   Elayne Snare, MD  potassium chloride SA (K-DUR,KLOR-CON) 20 MEQ tablet TAKE 1 TABLET BY MOUTH THREE TIMES DAILY 09/08/17   Dorothyann Peng, NP  Probiotic Product (PROBIOTIC DAILY) CAPS Take 1 by mouth daily 09/25/16   Mosie Lukes, MD  repaglinide (PRANDIN) 2 MG tablet TAKE 2 TABLETS BY MOUTH BEFORE BREAKFAST, 1 TABLET BY MOUTH BEFORE LUNCH AND 2 TABLETS BY MOUTH BEFORE SUPPER 09/29/17   Elayne Snare, MD  sodium bicarbonate 650 MG tablet Take 650 mg by mouth 3 (three) times daily.    [provider]  ticagrelor (BRILINTA) 90 MG TABS tablet Take 1 tablet (90 mg total) by mouth 2 (two) times daily. 12/02/16   Erlene Quan, PA-C  TOUJEO SOLOSTAR 300 UNIT/ML SOPN INJECT 10 UNITS UNDER THE SKIN DAILY 07/06/17   Elayne Snare, MD  triamcinolone cream (KENALOG) 0.1 % APP TO DRY ITCHY SKIN BID PRN 10/15/15   [provider]  valACYclovir (VALTREX) 500 MG tablet TAKE 1 TABLET BY MOUTH DAILY 06/19/17   Michel Bickers, MD    Family History Family History  Problem Relation Age of Onset  .  Hyperlipidemia Mother   . Diabetes Mother        paternal grandparents/1 brother  . Hyperlipidemia Father   . Hypertension Father        paternal grandmother/3 brothers/1 sister  . Arthritis Unknown        mother/father/paternal grandparents  . Breast cancer Maternal Aunt        paternal aunt  . Lung cancer Maternal Aunt   . Heart disease Unknown        parents/maternal grandparents/ 2 brothers  . Stroke Paternal Grandmother   . Mental retardation Sister     Social History Social History   Tobacco Use  . Smoking status: Never Smoker  . Smokeless tobacco: Never Used  Substance Use Topics  . Alcohol use: Yes    Alcohol/week: 0.0 oz    Comment: rare  . Drug use: No     Allergies   Patient has no known allergies.   Review of Systems Review of Systems  Constitutional: Negative for fever.  HENT: Negative for sore throat.   Eyes: Negative for redness.  Respiratory: Negative for cough and shortness of breath.   Cardiovascular: Positive for chest pain.  Gastrointestinal: Negative for abdominal pain.  Genitourinary: Negative for flank pain.  Musculoskeletal: Negative for neck pain.  Skin: Negative for rash.  Neurological: Negative for headaches.  Hematological: Does not bruise/bleed easily.  Psychiatric/Behavioral: Negative for confusion.     Physical Exam Updated Vital Signs BP (!) 164/80   Pulse 66   Temp 97.9 F (36.6 C) (Oral)   Resp 19   Ht 1.6 m (5\' 3" )   Wt 61.2 kg (135 lb)   SpO2 99%   BMI 23.91 kg/m   Physical Exam  Constitutional: He appears  well-developed and well-nourished. No distress.  HENT:  Mouth/Throat: Oropharynx is clear and moist.  Eyes: Conjunctivae are normal.  Neck: Neck supple. No tracheal deviation present.  Cardiovascular: Normal rate, regular rhythm, normal heart sounds and intact distal pulses. Exam reveals no gallop and no friction rub.  No murmur heard. Pulmonary/Chest: Effort normal and breath sounds normal. No accessory  muscle usage. No respiratory distress. He exhibits no tenderness.  Abdominal: Soft. Bowel sounds are normal. He exhibits no distension.  Musculoskeletal: He exhibits no edema or tenderness.  Neurological: He is alert.  Skin: Skin is warm and dry. He is not diaphoretic.  Psychiatric: He has a normal mood and affect.  Nursing note and vitals reviewed.    ED Treatments / Results  Labs (all labs ordered are listed, but only abnormal results are displayed) Results for orders placed or performed during the hospital encounter of 57/84/69  Basic metabolic panel  Result Value Ref Range   Sodium 137 135 - 145 mmol/L   Potassium 4.4 3.5 - 5.1 mmol/L   Chloride 105 101 - 111 mmol/L   CO2 19 (L) 22 - 32 mmol/L   Glucose, Bld 182 (H) 65 - 99 mg/dL   BUN 25 (H) 6 - 20 mg/dL   Creatinine, Ser 1.61 (H) 0.61 - 1.24 mg/dL   Calcium 8.6 (L) 8.9 - 10.3 mg/dL   GFR calc non Af Amer 42 (L) >60 mL/min   GFR calc Af Amer 49 (L) >60 mL/min   Anion gap 13 5 - 15  CBC  Result Value Ref Range   WBC 3.7 (L) 4.0 - 10.5 K/uL   RBC 3.84 (L) 4.22 - 5.81 MIL/uL   Hemoglobin 12.2 (L) 13.0 - 17.0 g/dL   HCT 37.1 (L) 39.0 - 52.0 %   MCV 96.6 78.0 - 100.0 fL   MCH 31.8 26.0 - 34.0 pg   MCHC 32.9 30.0 - 36.0 g/dL   RDW 14.2 11.5 - 15.5 %   Platelets 150 150 - 400 K/uL  I-stat troponin, ED  Result Value Ref Range   Troponin i, poc 0.02 0.00 - 0.08 ng/mL   Comment 3           Dg Chest 2 View  Result Date: 10/20/2017 CLINICAL DATA:  Chest pain. EXAM: CHEST - 2 VIEW COMPARISON:  05/05/2017 FINDINGS: Post median sternotomy.The cardiomediastinal contours are normal. Coronary artery calcifications or stents. The lungs are clear. Pulmonary vasculature is normal. No consolidation, pleural effusion, or pneumothorax. No acute osseous abnormalities are seen. IMPRESSION: Post median sternotomy without acute abnormality. Electronically Signed   By: Jeb Levering M.D.   On: 10/20/2017 06:58    EKG  EKG  Interpretation  Date/Time:  Tuesday October 20 2017 06:22:39 EDT Ventricular Rate:  63 PR Interval:    QRS Duration: 102 QT Interval:  433 QTC Calculation: 444 R Axis:   -9 Text Interpretation:  Sinus rhythm Borderline prolonged PR interval Probable left atrial enlargement LVH with secondary repolarization abnormality Inferior infarct, old No significant change since last tracing Confirmed by Ripley Fraise [id number]) on 10/20/2017 6:32:01 AM       Radiology Dg Chest 2 View  Result Date: 10/20/2017 CLINICAL DATA:  Chest pain. EXAM: CHEST - 2 VIEW COMPARISON:  05/05/2017 FINDINGS: Post median sternotomy.The cardiomediastinal contours are normal. Coronary artery calcifications or stents. The lungs are clear. Pulmonary vasculature is normal. No consolidation, pleural effusion, or pneumothorax. No acute osseous abnormalities are seen. IMPRESSION: Post median sternotomy without acute abnormality. Electronically Signed  By: Jeb Levering M.D.   On: 10/20/2017 06:58    Procedures Procedures (including critical care time)  Medications Ordered in ED Medications - No data to display   Initial Impression / Assessment and Plan / ED Course  I have reviewed the triage vital signs and the nursing notes.  Pertinent labs & imaging results that were available during my care of the patient were reviewed by me and considered in my medical decision making (see chart for details).  Iv ns. Labs. Cxr.   Reviewed nursing notes and prior charts for additional history.   Patient with hx cad/cabg, presents w increasing cp. Diff dx includes unstable angina, acs, and other causes of chest pain.  Patient has already had asa. Took ntg earlier and is currently pain free.  Given extensive cardiac history, and worsening episodes chest pain, will consult cardiology to see patient in ED.   Initial trop normal. Recheck pt, remains pain free.  Awaiting cardiology eval.     Final Clinical Impressions(s) / ED  Diagnoses   Final diagnoses:  None    ED Discharge Orders    None       Lajean Saver, MD 10/20/17 [phone]

## 2017-10-20 NOTE — Progress Notes (Signed)
ANTICOAGULATION CONSULT NOTE - Initial Consult  Pharmacy Consult for heparin Indication: chest pain/ACS  No Known Allergies  Patient Measurements: Height: 5\' 3"  (160 cm) Weight: 135 lb (61.2 kg) IBW/kg (Calculated) : 56.9 Heparin Dosing Weight: 61 kg  Vital Signs: Temp: 97.9 F (36.6 C) (03/19 0628) Temp Source: Oral (03/19 0628) BP: 159/90 (03/19 1245) Pulse Rate: 81 (03/19 1245)  Labs: Recent Labs    10/20/17 0656  HGB 12.2*  HCT 37.1*  PLT 150  CREATININE 1.61*    Estimated Creatinine Clearance: 34.9 mL/min (A) (by C-G formula based on SCr of 1.61 mg/dL (H)).  Assessment: CC/HPI: 72 yo m presenting with CP  PMH: HTN, HLD, CKD, COPD, DM2, HIV, CAD s/p CABG  Anticoag: none pta iv hep for r/o ACS  Renal: SCr 1.61  Heme/Onc: H&H 12.2/37.1, Plt 150  Goal of Therapy:  Heparin level 0.3-0.7 units/ml Monitor platelets by anticoagulation protocol: Yes   Plan:  Heparin bolus 3500 units x 1 Heparin gtt 700 units/hr Initial lvl 2200 Daily hep lvl cbc Plans for LHC Wed  [name], PharmD, BCPS, BCCCP Clinical Pharmacist Clinical phone for 10/20/2017 from 7a-3:30p: [phone] If after 3:30p, please call main pharmacy at: [phone] 10/20/2017 2:14 PM

## 2017-10-21 ENCOUNTER — Encounter (HOSPITAL_COMMUNITY): Payer: Self-pay | Admitting: Interventional Cardiology

## 2017-10-21 ENCOUNTER — Encounter (HOSPITAL_COMMUNITY)
Admission: EM | Disposition: A | Discharge status: Home / Self Care | Payer: Self-pay | Source: Home / Self Care | Attending: Cardiology

## 2017-10-21 ENCOUNTER — Other Ambulatory Visit: Payer: Self-pay

## 2017-10-21 DIAGNOSIS — I10 Essential (primary) hypertension: Secondary | ICD-10-CM

## 2017-10-21 DIAGNOSIS — I25810 Atherosclerosis of coronary artery bypass graft(s) without angina pectoris: Secondary | ICD-10-CM

## 2017-10-21 DIAGNOSIS — I2510 Atherosclerotic heart disease of native coronary artery without angina pectoris: Secondary | ICD-10-CM

## 2017-10-21 DIAGNOSIS — N183 Chronic kidney disease, stage 3 (moderate): Secondary | ICD-10-CM

## 2017-10-21 DIAGNOSIS — I214 Non-ST elevation (NSTEMI) myocardial infarction: Principal | ICD-10-CM

## 2017-10-21 HISTORY — PX: LEFT HEART CATH AND CORS/GRAFTS ANGIOGRAPHY: CATH118250

## 2017-10-21 HISTORY — PX: CORONARY STENT INTERVENTION: CATH118234

## 2017-10-21 LAB — BASIC METABOLIC PANEL
Anion gap: 9 (ref 5–15)
BUN: 23 mg/dL — ABNORMAL HIGH (ref 6–20)
CHLORIDE: 109 mmol/L (ref 101–111)
CO2: 19 mmol/L — AB (ref 22–32)
Calcium: 8.6 mg/dL — ABNORMAL LOW (ref 8.9–10.3)
Creatinine, Ser: 1.43 mg/dL — ABNORMAL HIGH (ref 0.61–1.24)
GFR calc Af Amer: 56 mL/min — ABNORMAL LOW (ref >60)
GFR calc non Af Amer: 48 mL/min — ABNORMAL LOW (ref >60)
Glucose, Bld: 139 mg/dL — ABNORMAL HIGH (ref 65–99)
POTASSIUM: 3.9 mmol/L (ref 3.5–5.1)
SODIUM: 137 mmol/L (ref 135–145)

## 2017-10-21 LAB — LIPID PANEL
CHOLESTEROL: 140 mg/dL (ref 0–200)
HDL: 28 mg/dL — AB (ref >40)
LDL CALC: 33 mg/dL (ref 0–99)
TRIGLYCERIDES: 397 mg/dL — AB (ref <150)
Total CHOL/HDL Ratio: 5 RATIO
VLDL: 79 mg/dL — AB (ref 0–40)

## 2017-10-21 LAB — CBC
HEMATOCRIT: 36.7 % — AB (ref 39.0–52.0)
HEMOGLOBIN: 12.2 g/dL — AB (ref 13.0–17.0)
MCH: 31.7 pg (ref 26.0–34.0)
MCHC: 33.2 g/dL (ref 30.0–36.0)
MCV: 95.3 fL (ref 78.0–100.0)
Platelets: 154 10*3/uL (ref 150–400)
RBC: 3.85 MIL/uL — ABNORMAL LOW (ref 4.22–5.81)
RDW: 13.5 % (ref 11.5–15.5)
WBC: 4.6 10*3/uL (ref 4.0–10.5)

## 2017-10-21 LAB — ECHOCARDIOGRAM COMPLETE
HEIGHTINCHES: 63 in
WEIGHTICAEL: 2160 [oz_av]

## 2017-10-21 LAB — GLUCOSE, CAPILLARY
GLUCOSE-CAPILLARY: 111 mg/dL — AB (ref 65–99)
GLUCOSE-CAPILLARY: 158 mg/dL — AB (ref 65–99)
Glucose-Capillary: 157 mg/dL — ABNORMAL HIGH (ref 65–99)
Glucose-Capillary: 158 mg/dL — ABNORMAL HIGH (ref 65–99)

## 2017-10-21 LAB — HEPARIN LEVEL (UNFRACTIONATED): HEPARIN UNFRACTIONATED: 0.34 [IU]/mL (ref 0.30–0.70)

## 2017-10-21 LAB — POCT ACTIVATED CLOTTING TIME
ACTIVATED CLOTTING TIME: 235 s
ACTIVATED CLOTTING TIME: 290 s

## 2017-10-21 LAB — PROTIME-INR
INR: 1.07
Prothrombin Time: 13.8 seconds (ref 11.4–15.2)

## 2017-10-21 LAB — MRSA PCR SCREENING: MRSA BY PCR: NEGATIVE

## 2017-10-21 LAB — TROPONIN I: Troponin I: 0.33 ng/mL (ref <0.03)

## 2017-10-21 SURGERY — LEFT HEART CATH AND CORS/GRAFTS ANGIOGRAPHY
Anesthesia: LOCAL

## 2017-10-21 MED ORDER — LIDOCAINE HCL (PF) 1 % IJ SOLN
INTRAMUSCULAR | Status: AC
  Filled 2017-10-21: qty 30

## 2017-10-21 MED ORDER — SODIUM CHLORIDE 0.9% FLUSH
3.0000 mL | Freq: Two times a day (BID) | INTRAVENOUS | Status: DC
  Administered 2017-10-21: 3 mL via INTRAVENOUS

## 2017-10-21 MED ORDER — VERAPAMIL HCL 2.5 MG/ML IV SOLN
INTRAVENOUS | Status: DC | PRN
  Administered 2017-10-21 (×2): 200 ug via INTRACORONARY

## 2017-10-21 MED ORDER — ASPIRIN 81 MG PO CHEW
81.0000 mg | CHEWABLE_TABLET | Freq: Every day | ORAL | Status: DC
  Administered 2017-10-22: 81 mg via ORAL
  Filled 2017-10-21: qty 1

## 2017-10-21 MED ORDER — MIDAZOLAM HCL 2 MG/2ML IJ SOLN
INTRAMUSCULAR | Status: AC
  Filled 2017-10-21: qty 2

## 2017-10-21 MED ORDER — DOLUTEGRAVIR SODIUM 50 MG PO TABS
50.0000 mg | ORAL_TABLET | Freq: Every day | ORAL | Status: DC
  Administered 2017-10-21: 19:00:00 50 mg via ORAL
  Filled 2017-10-21: qty 1

## 2017-10-21 MED ORDER — TICAGRELOR 90 MG PO TABS
ORAL_TABLET | ORAL | Status: DC | PRN
  Administered 2017-10-21: 90 mg via ORAL

## 2017-10-21 MED ORDER — LIDOCAINE HCL (PF) 1 % IJ SOLN
INTRAMUSCULAR | Status: DC | PRN
  Administered 2017-10-21: 15 mL

## 2017-10-21 MED ORDER — PIOGLITAZONE HCL 15 MG PO TABS
15.0000 mg | ORAL_TABLET | Freq: Every day | ORAL | Status: DC
  Administered 2017-10-22: 09:00:00 15 mg via ORAL
  Filled 2017-10-21: qty 1

## 2017-10-21 MED ORDER — BIVALIRUDIN TRIFLUOROACETATE 250 MG IV SOLR
INTRAVENOUS | Status: AC
  Filled 2017-10-21: qty 250

## 2017-10-21 MED ORDER — PANTOPRAZOLE SODIUM 40 MG PO TBEC
40.0000 mg | DELAYED_RELEASE_TABLET | Freq: Every day | ORAL | Status: DC
  Administered 2017-10-21 – 2017-10-22 (×2): 40 mg via ORAL
  Filled 2017-10-21 (×2): qty 1

## 2017-10-21 MED ORDER — SODIUM CHLORIDE 0.9 % IV SOLN: 250.0000 mL | INTRAVENOUS | Status: DC | PRN

## 2017-10-21 MED ORDER — VERAPAMIL HCL 2.5 MG/ML IV SOLN
INTRAVENOUS | Status: AC
  Filled 2017-10-21: qty 2

## 2017-10-21 MED ORDER — HYDRALAZINE HCL 20 MG/ML IJ SOLN
5.0000 mg | INTRAMUSCULAR | Status: AC | PRN
  Administered 2017-10-21: 13:00:00 5 mg via INTRAVENOUS
  Filled 2017-10-21: qty 1

## 2017-10-21 MED ORDER — FENTANYL CITRATE (PF) 100 MCG/2ML IJ SOLN
INTRAMUSCULAR | Status: DC | PRN
  Administered 2017-10-21: 25 ug via INTRAVENOUS

## 2017-10-21 MED ORDER — LABETALOL HCL 5 MG/ML IV SOLN: 10.0000 mg | INTRAVENOUS | Status: AC | PRN

## 2017-10-21 MED ORDER — BIVALIRUDIN BOLUS VIA INFUSION - CUPID
INTRAVENOUS | Status: DC | PRN
  Administered 2017-10-21 (×2): 46.2 mg via INTRAVENOUS

## 2017-10-21 MED ORDER — IOPAMIDOL (ISOVUE-370) INJECTION 76%
INTRAVENOUS | Status: AC
  Filled 2017-10-21: qty 150

## 2017-10-21 MED ORDER — SODIUM BICARBONATE 650 MG PO TABS
650.0000 mg | ORAL_TABLET | Freq: Three times a day (TID) | ORAL | Status: DC
  Administered 2017-10-21 – 2017-10-22 (×3): 650 mg via ORAL
  Filled 2017-10-21 (×4): qty 1

## 2017-10-21 MED ORDER — SODIUM CHLORIDE 0.9 % IV SOLN
INTRAVENOUS | Status: AC
  Administered 2017-10-21: 12:00:00 via INTRAVENOUS

## 2017-10-21 MED ORDER — HEPARIN (PORCINE) IN NACL 2-0.9 UNIT/ML-% IJ SOLN
INTRAMUSCULAR | Status: AC
  Filled 2017-10-21: qty 1000

## 2017-10-21 MED ORDER — ONDANSETRON HCL 4 MG/2ML IJ SOLN: 4.0000 mg | Freq: Four times a day (QID) | INTRAMUSCULAR | Status: DC | PRN

## 2017-10-21 MED ORDER — FENTANYL CITRATE (PF) 100 MCG/2ML IJ SOLN
INTRAMUSCULAR | Status: AC
  Filled 2017-10-21: qty 2

## 2017-10-21 MED ORDER — MIDAZOLAM HCL 2 MG/2ML IJ SOLN
INTRAMUSCULAR | Status: DC | PRN
  Administered 2017-10-21: 2 mg via INTRAVENOUS

## 2017-10-21 MED ORDER — SODIUM CHLORIDE 0.9% FLUSH: 3.0000 mL | INTRAVENOUS | Status: DC | PRN

## 2017-10-21 MED ORDER — TICAGRELOR 90 MG PO TABS
ORAL_TABLET | ORAL | Status: AC
  Filled 2017-10-21: qty 1

## 2017-10-21 MED ORDER — ACETAMINOPHEN 325 MG PO TABS: 650.0000 mg | ORAL_TABLET | ORAL | Status: DC | PRN

## 2017-10-21 MED ORDER — HEPARIN (PORCINE) IN NACL 2-0.9 UNIT/ML-% IJ SOLN
INTRAMUSCULAR | Status: DC | PRN
  Administered 2017-10-21 (×2): 500 mL

## 2017-10-21 MED ORDER — CLOPIDOGREL BISULFATE 75 MG PO TABS
300.0000 mg | ORAL_TABLET | Freq: Once | ORAL | Status: AC
  Administered 2017-10-21: 22:00:00 300 mg via ORAL
  Filled 2017-10-21: qty 4

## 2017-10-21 MED ORDER — RILPIVIRINE HCL 25 MG PO TABS
25.0000 mg | ORAL_TABLET | Freq: Every day | ORAL | Status: DC
  Administered 2017-10-21: 19:00:00 25 mg via ORAL
  Filled 2017-10-21: qty 1

## 2017-10-21 MED ORDER — SODIUM CHLORIDE 0.9 % IV SOLN
INTRAVENOUS | Status: DC | PRN
  Administered 2017-10-21: 1.75 mg/kg/h via INTRAVENOUS

## 2017-10-21 MED ORDER — CLOPIDOGREL BISULFATE 75 MG PO TABS
75.0000 mg | ORAL_TABLET | Freq: Every day | ORAL | Status: DC
  Administered 2017-10-22: 75 mg via ORAL
  Filled 2017-10-21: qty 1

## 2017-10-21 MED ORDER — TICAGRELOR 90 MG PO TABS: 90.0000 mg | ORAL_TABLET | Freq: Two times a day (BID) | ORAL | Status: DC

## 2017-10-21 MED ORDER — IOPAMIDOL (ISOVUE-370) INJECTION 76%
INTRAVENOUS | Status: DC | PRN
  Administered 2017-10-21: 85 mL

## 2017-10-21 SURGICAL SUPPLY — 22 items
BALLN SAPPHIRE 2.0X15 (BALLOONS) ×2
BALLN SAPPHIRE ~~LOC~~ 4.0X8 (BALLOONS) ×1 IMPLANT
BALLOON SAPPHIRE 2.0X15 (BALLOONS) IMPLANT
CATH INFINITI 5 FR IM (CATHETERS) ×1 IMPLANT
CATH INFINITI 5FR JL4 (CATHETERS) ×1 IMPLANT
CATH INFINITI JR4 5F (CATHETERS) ×1 IMPLANT
CATH LAUNCHER 6FR AL1 (CATHETERS) IMPLANT
CATHETER LAUNCHER 6FR AL1 (CATHETERS) ×2
KIT ENCORE 26 ADVANTAGE (KITS) ×1 IMPLANT
KIT HEART LEFT (KITS) ×2 IMPLANT
PACK CARDIAC CATHETERIZATION (CUSTOM PROCEDURE TRAY) ×2 IMPLANT
SHEATH AVANTI 11CM 5FR (SHEATH) ×1 IMPLANT
SHEATH AVANTI 11CM 6FR (SHEATH) ×1 IMPLANT
STENT SYNERGY DES 2.5X20 (Permanent Stent) ×1 IMPLANT
STENT SYNERGY DES 3.5X20 (Permanent Stent) ×1 IMPLANT
STENT SYNERGY DES 3X16 (Permanent Stent) ×1 IMPLANT
TRANSDUCER W/STOPCOCK (MISCELLANEOUS) ×2 IMPLANT
TUBING CIL FLEX 10 FLL-RA (TUBING) ×2 IMPLANT
VALVE GUARDIAN II ~~LOC~~ HEMO (MISCELLANEOUS) ×1 IMPLANT
WIRE ASAHI PROWATER 180CM (WIRE) ×1 IMPLANT
WIRE EMERALD 3MM-J .035X150CM (WIRE) ×1 IMPLANT
WIRE HI TORQ VERSACORE-J 145CM (WIRE) ×1 IMPLANT

## 2017-10-21 NOTE — H&P (View-Only) (Signed)
Progress Note  Patient Name: [name] Date of Encounter: 10/21/2017  Primary Cardiologist: Kirk Ruths, MD   Subjective   No chest pain or dyspnea  Inpatient Medications    Scheduled Meds: . aspirin  324 mg Oral NOW   Or  . aspirin  300 mg Rectal NOW  . aspirin  81 mg Oral Daily  . atorvastatin  20 mg Oral q1800  . darunavir-cobicistat  1 tablet Oral Q supper  . dolutegravir  50 mg Oral Daily  . metoprolol tartrate  12.5 mg Oral BID  . rilpivirine  25 mg Oral Q breakfast  . sodium chloride flush  3 mL Intravenous Q12H  . ticagrelor  90 mg Oral BID  . valACYclovir  500 mg Oral Daily   Continuous Infusions: . sodium chloride    . sodium chloride 1 mL/kg/hr (10/20/17 2154)  . heparin 950 Units/hr (10/21/17 0023)  . nitroGLYCERIN 10 mcg/min (10/20/17 1814)   PRN Meds: sodium chloride, acetaminophen, nitroGLYCERIN, ondansetron (ZOFRAN) IV, sodium chloride flush   Vital Signs    Vitals:   10/20/17 2204 10/20/17 2300 10/21/17 0553 10/21/17 0744  BP:  139/76 140/76 (!) 142/85  Pulse: 80 70 65 72  Resp:    16  Temp:  97.9 F (36.6 C) 98 F (36.7 C) 98 F (36.7 C)  TempSrc:  Oral Oral Oral  SpO2:  97% 98% 98%  Weight:   135 lb 12.9 oz (61.6 kg)   Height:        Intake/Output Summary (Last 24 hours) at 10/21/2017 0902 Last data filed at 10/21/2017 0535 Gross per 24 hour  Intake 548 ml  Output 865 ml  Net -317 ml   Filed Weights   10/20/17 0625 10/21/17 0553  Weight: 135 lb (61.2 kg) 135 lb 12.9 oz (61.6 kg)    Telemetry    Sinus- Personally Reviewed   Physical Exam   GEN: No acute distress.   Neck: No JVD Cardiac: RRR, no murmurs, rubs, or gallops.  Respiratory: Clear to auscultation bilaterally. GI: Soft, nontender, non-distended  MS: No edema Neuro:  Nonfocal  Psych: Normal affect   Labs    Chemistry Recent Labs  Lab 10/15/17 0819 10/20/17 0656 10/21/17 0211  NA 142 137 137  K 5.5* 4.4 3.9  CL 110 105 109  CO2 24 19* 19*    GLUCOSE 97 182* 139*  BUN 25* 25* 23*  CREATININE 1.81* 1.61* 1.43*  CALCIUM 9.8 8.6* 8.6*  GFRNONAA  --  42* 48*  GFRAA  --  49* 56*  ANIONGAP  --  13 9     Hematology Recent Labs  Lab 10/20/17 0656 10/21/17 0211  WBC 3.7* 4.6  RBC 3.84* 3.85*  HGB 12.2* 12.2*  HCT 37.1* 36.7*  MCV 96.6 95.3  MCH 31.8 31.7  MCHC 32.9 33.2  RDW 14.2 13.5  PLT 150 154    Cardiac Enzymes Recent Labs  Lab 10/20/17 1703 10/20/17 1836 10/21/17 0211  TROPONINI 0.09* 0.13* 0.33*    Recent Labs  Lab 10/20/17 0700  TROPIPOC 0.02     Radiology    Dg Chest 2 View  Result Date: 10/20/2017 CLINICAL DATA:  Chest pain. EXAM: CHEST - 2 VIEW COMPARISON:  05/05/2017 FINDINGS: Post median sternotomy.The cardiomediastinal contours are normal. Coronary artery calcifications or stents. The lungs are clear. Pulmonary vasculature is normal. No consolidation, pleural effusion, or pneumothorax. No acute osseous abnormalities are seen. IMPRESSION: Post median sternotomy without acute abnormality. Electronically Signed  By: Jeb Levering M.D.   On: 10/20/2017 06:58    Patient Profile     71 year old male with past medical history of coronary artery disease status post coronary artery bypass and graft, hypertension, hyperlipidemia, chronic stage III kidney disease, HIV, COPD with unstable angina.      Assessment & Plan    1 NSTEMI-Pt has ruled in (troponin 0.33). Continue aspirin, brilinta, statin and heparin. Plan cardiac catheterization.  Await echocardiogram for LV function.  The risks and benefits of catheterization including myocardial infarction, CVA, death and worsening renal function discussed and he agrees to proceed.  2 hyperlipidemia-continue statin.  3 chronic stage III kidney disease-Renal function unchanged this AM; hydrated prior to cath; ACEI on hold; follow renal function following procedure.  4 Hypertension-blood pressure is mildly elevated.  Will resume lisinopril following  cath when renal function stable.  5 HIV-continue preadmission meds.    For questions or updates, please contact [place] Please consult [email or web address] for contact info under Cardiology/STEMI.      Signed, Kirk Ruths, MD  10/21/2017, 9:02 AM

## 2017-10-21 NOTE — Care Management Note (Addendum)
Case Management Note  Patient Details  Name: [name] MRN: [id number] Date of Birth: 1946-05-05  Subjective/Objective:   Pt presented for Nstemi- post cath with stent. CM will provide pt with 30 day free Brilinta Card-pt states he has been on Brilinta. No further needs from CM at this time.                   Action/Plan: Benefits Check completed for Brilinta:   S/W CHRISTINE @ [facility] RX #  [phone]    BRILINTA  90 MG BID  COVER- YES  CO-PAY- $ 8.50  Q/L TWO PILL PER DAY  TIER- 4 DRUG  PRIOR APPROVAL- NO   PREFERRED PHARMACY : [facility]  Expected Discharge Date:                  Expected Discharge Plan:  Home/Self Care  In-House Referral:  NA  Discharge planning Services  CM Consult, Medication Assistance  Post Acute Care Choice:  NA Choice offered to:  NA  DME Arranged:  N/A DME Agency:  NA  HH Arranged:  NA HH Agency:  NA  Status of Service:  Completed, signed off  If discussed at [place] of Stay Meetings, dates discussed:    Additional Comments:  Bethena Roys, RN 10/21/2017, 3:52 PM

## 2017-10-21 NOTE — Plan of Care (Signed)
Patient remains free of chest pain this AM, plan for catherization today.  Patient remains on Nitro gtt at this time

## 2017-10-21 NOTE — Progress Notes (Signed)
Site area: right groin  Site Prior to Removal:  Level 0  Pressure Applied For 20 MINUTES    Minutes Beginning at 1335  Manual:   Yes.    Patient Status During Pull:  Patient remained A&O by four, unlabored breathing noted, no bleeding or hematoma noted during removal of sheath. Patient tolerated well.   Post Pull Groin Site:  Level 0  Post Pull Instructions Given:  Yes.    Post Pull Pulses Present:  Yes.    Dressing Applied:  Yes.    Comments:  Dressing applied to right groin, no bleeding ot hematoma noted. Post removal instructions provided and teach back effective. Patient tolerated removal well with no complaints. Hydralazine given post removal prn as ordered for SBP>160.

## 2017-10-21 NOTE — Interval H&P Note (Signed)
Cath Lab Visit (complete for each Cath Lab visit)  Clinical Evaluation Leading to the Procedure:   ACS: Yes.    Non-ACS:    Anginal Classification: CCS IV  Anti-ischemic medical therapy: Minimal Therapy (1 class of medications)  Non-Invasive Test Results: No non-invasive testing performed  Prior CABG: Previous CABG      History and Physical Interval Note:  10/21/2017 9:38 AM  [name]  has presented today for surgery, with the diagnosis of cp  The various methods of treatment have been discussed with the patient and family. After consideration of risks, benefits and other options for treatment, the patient has consented to  Procedure(s): LEFT HEART CATH AND CORS/GRAFTS ANGIOGRAPHY (N/A) as a surgical intervention .  The patient's history has been reviewed, patient examined, no change in status, stable for surgery.  I have reviewed the patient's chart and labs.  Questions were answered to the patient's satisfaction.     Larae Grooms

## 2017-10-21 NOTE — Progress Notes (Signed)
Progress Note  Patient Name: [name] Date of Encounter: 10/21/2017  Primary Cardiologist: Kirk Ruths, MD   Subjective   No chest pain or dyspnea  Inpatient Medications    Scheduled Meds: . aspirin  324 mg Oral NOW   Or  . aspirin  300 mg Rectal NOW  . aspirin  81 mg Oral Daily  . atorvastatin  20 mg Oral q1800  . darunavir-cobicistat  1 tablet Oral Q supper  . dolutegravir  50 mg Oral Daily  . metoprolol tartrate  12.5 mg Oral BID  . rilpivirine  25 mg Oral Q breakfast  . sodium chloride flush  3 mL Intravenous Q12H  . ticagrelor  90 mg Oral BID  . valACYclovir  500 mg Oral Daily   Continuous Infusions: . sodium chloride    . sodium chloride 1 mL/kg/hr (10/20/17 2154)  . heparin 950 Units/hr (10/21/17 0023)  . nitroGLYCERIN 10 mcg/min (10/20/17 1814)   PRN Meds: sodium chloride, acetaminophen, nitroGLYCERIN, ondansetron (ZOFRAN) IV, sodium chloride flush   Vital Signs    Vitals:   10/20/17 2204 10/20/17 2300 10/21/17 0553 10/21/17 0744  BP:  139/76 140/76 (!) 142/85  Pulse: 80 70 65 72  Resp:    16  Temp:  97.9 F (36.6 C) 98 F (36.7 C) 98 F (36.7 C)  TempSrc:  Oral Oral Oral  SpO2:  97% 98% 98%  Weight:   135 lb 12.9 oz (61.6 kg)   Height:        Intake/Output Summary (Last 24 hours) at 10/21/2017 0902 Last data filed at 10/21/2017 0535 Gross per 24 hour  Intake 548 ml  Output 865 ml  Net -317 ml   Filed Weights   10/20/17 0625 10/21/17 0553  Weight: 135 lb (61.2 kg) 135 lb 12.9 oz (61.6 kg)    Telemetry    Sinus- Personally Reviewed   Physical Exam   GEN: No acute distress.   Neck: No JVD Cardiac: RRR, no murmurs, rubs, or gallops.  Respiratory: Clear to auscultation bilaterally. GI: Soft, nontender, non-distended  MS: No edema Neuro:  Nonfocal  Psych: Normal affect   Labs    Chemistry Recent Labs  Lab 10/15/17 0819 10/20/17 0656 10/21/17 0211  NA 142 137 137  K 5.5* 4.4 3.9  CL 110 105 109  CO2 24 19* 19*    GLUCOSE 97 182* 139*  BUN 25* 25* 23*  CREATININE 1.81* 1.61* 1.43*  CALCIUM 9.8 8.6* 8.6*  GFRNONAA  --  42* 48*  GFRAA  --  49* 56*  ANIONGAP  --  13 9     Hematology Recent Labs  Lab 10/20/17 0656 10/21/17 0211  WBC 3.7* 4.6  RBC 3.84* 3.85*  HGB 12.2* 12.2*  HCT 37.1* 36.7*  MCV 96.6 95.3  MCH 31.8 31.7  MCHC 32.9 33.2  RDW 14.2 13.5  PLT 150 154    Cardiac Enzymes Recent Labs  Lab 10/20/17 1703 10/20/17 1836 10/21/17 0211  TROPONINI 0.09* 0.13* 0.33*    Recent Labs  Lab 10/20/17 0700  TROPIPOC 0.02     Radiology    Dg Chest 2 View  Result Date: 10/20/2017 CLINICAL DATA:  Chest pain. EXAM: CHEST - 2 VIEW COMPARISON:  05/05/2017 FINDINGS: Post median sternotomy.The cardiomediastinal contours are normal. Coronary artery calcifications or stents. The lungs are clear. Pulmonary vasculature is normal. No consolidation, pleural effusion, or pneumothorax. No acute osseous abnormalities are seen. IMPRESSION: Post median sternotomy without acute abnormality. Electronically Signed  By: Jeb Levering M.D.   On: 10/20/2017 06:58    Patient Profile     72 year old male with past medical history of coronary artery disease status post coronary artery bypass and graft, hypertension, hyperlipidemia, chronic stage III kidney disease, HIV, COPD with unstable angina.      Assessment & Plan    1 NSTEMI-Pt has ruled in (troponin 0.33). Continue aspirin, brilinta, statin and heparin. Plan cardiac catheterization.  Await echocardiogram for LV function.  The risks and benefits of catheterization including myocardial infarction, CVA, death and worsening renal function discussed and he agrees to proceed.  2 hyperlipidemia-continue statin.  3 chronic stage III kidney disease-Renal function unchanged this AM; hydrated prior to cath; ACEI on hold; follow renal function following procedure.  4 Hypertension-blood pressure is mildly elevated.  Will resume lisinopril following  cath when renal function stable.  5 HIV-continue preadmission meds.    For questions or updates, please contact [place] Please consult [email or web address] for contact info under Cardiology/STEMI.      Signed, Kirk Ruths, MD  10/21/2017, 9:02 AM

## 2017-10-22 ENCOUNTER — Encounter (HOSPITAL_COMMUNITY): Payer: Self-pay | Admitting: Interventional Cardiology

## 2017-10-22 ENCOUNTER — Encounter: Payer: Self-pay | Admitting: Internal Medicine

## 2017-10-22 ENCOUNTER — Encounter: Payer: Self-pay | Admitting: Adult Health

## 2017-10-22 ENCOUNTER — Other Ambulatory Visit: Payer: Self-pay | Admitting: Physician Assistant

## 2017-10-22 DIAGNOSIS — N183 Chronic kidney disease, stage 3 unspecified: Secondary | ICD-10-CM

## 2017-10-22 LAB — BASIC METABOLIC PANEL
ANION GAP: 9 (ref 5–15)
BUN: 21 mg/dL — ABNORMAL HIGH (ref 6–20)
CALCIUM: 8.7 mg/dL — AB (ref 8.9–10.3)
CO2: 21 mmol/L — AB (ref 22–32)
Chloride: 107 mmol/L (ref 101–111)
Creatinine, Ser: 1.41 mg/dL — ABNORMAL HIGH (ref 0.61–1.24)
GFR calc Af Amer: 57 mL/min — ABNORMAL LOW (ref >60)
GFR calc non Af Amer: 49 mL/min — ABNORMAL LOW (ref >60)
GLUCOSE: 145 mg/dL — AB (ref 65–99)
Potassium: 4 mmol/L (ref 3.5–5.1)
Sodium: 137 mmol/L (ref 135–145)

## 2017-10-22 LAB — CBC
HEMATOCRIT: 37.5 % — AB (ref 39.0–52.0)
Hemoglobin: 12.3 g/dL — ABNORMAL LOW (ref 13.0–17.0)
MCH: 31.1 pg (ref 26.0–34.0)
MCHC: 32.8 g/dL (ref 30.0–36.0)
MCV: 94.7 fL (ref 78.0–100.0)
PLATELETS: 152 10*3/uL (ref 150–400)
RBC: 3.96 MIL/uL — AB (ref 4.22–5.81)
RDW: 13.5 % (ref 11.5–15.5)
WBC: 4.3 10*3/uL (ref 4.0–10.5)

## 2017-10-22 LAB — GLUCOSE, CAPILLARY: Glucose-Capillary: 140 mg/dL — ABNORMAL HIGH (ref 65–99)

## 2017-10-22 MED ORDER — ANGIOPLASTY BOOK
Freq: Once | Status: AC
  Administered 2017-10-22: 06:00:00
  Filled 2017-10-22: qty 1

## 2017-10-22 MED ORDER — LISINOPRIL 5 MG PO TABS
5.0000 mg | ORAL_TABLET | Freq: Every day | ORAL | Status: DC
  Administered 2017-10-22: 5 mg via ORAL
  Filled 2017-10-22: qty 1

## 2017-10-22 MED ORDER — HEART ATTACK BOUNCING BOOK
Freq: Once | Status: AC
  Administered 2017-10-22: 06:00:00
  Filled 2017-10-22: qty 1

## 2017-10-22 MED ORDER — LISINOPRIL 5 MG PO TABS
5.0000 mg | ORAL_TABLET | Freq: Every day | ORAL | 3 refills | Status: DC
Start: 2017-10-22 — End: 2018-11-18

## 2017-10-22 MED ORDER — CLOPIDOGREL BISULFATE 75 MG PO TABS
75.0000 mg | ORAL_TABLET | Freq: Every day | ORAL | 11 refills | Status: DC
Start: 2017-10-22 — End: 2018-10-11

## 2017-10-22 MED FILL — Heparin Sodium (Porcine) 2 Unit/ML in Sodium Chloride 0.9%: INTRAMUSCULAR | Qty: 1000 | Status: AC

## 2017-10-22 NOTE — Consult Note (Signed)
            [facility] Primary Care Navigator  10/22/2017  MELVIN WHITEFORD 01-30-1946 [id number]   Went to seepatient at the bedsideto identify possible discharge needs but he was alreadydischargedhome per staff report.   PerMD note,patient wasadmitted with unstable angina- (non-ST elevated myocardial infarction), status post cardiac catheterization with stent.  Primary care provider's officeis listed as providingtransition of care (TOC) follow-up.  Patient has discharge instruction to follow-up with cardiology on 3/25.   For additional questions please contact:  Edwena Felty A. Jullia Mulligan, BSN, RN-BC [facility] PRIMARY CARE Navigator Cell: [phone]
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
## 2017-10-22 NOTE — Progress Notes (Signed)
Progress Note  Patient Name: [name] Date of Encounter: 10/22/2017  Primary Cardiologist: Kirk Ruths, MD   Subjective   Pt denies CP or dyspnea  Inpatient Medications    Scheduled Meds: . aspirin  81 mg Oral Daily  . atorvastatin  20 mg Oral q1800  . clopidogrel  75 mg Oral Daily  . darunavir-cobicistat  1 tablet Oral Q supper  . dolutegravir  50 mg Oral Q supper   And  . rilpivirine  25 mg Oral Q supper  . metoprolol tartrate  12.5 mg Oral BID  . pantoprazole  40 mg Oral Daily  . pioglitazone  15 mg Oral Daily  . sodium bicarbonate  650 mg Oral TID  . sodium chloride flush  3 mL Intravenous Q12H  . valACYclovir  500 mg Oral Daily   Continuous Infusions: . sodium chloride    . nitroGLYCERIN Stopped (10/21/17 1108)   PRN Meds: sodium chloride, acetaminophen, nitroGLYCERIN, ondansetron (ZOFRAN) IV, ondansetron (ZOFRAN) IV, sodium chloride flush   Vital Signs    Vitals:   10/21/17 2000 10/21/17 2146 10/22/17 0218 10/22/17 0624  BP:  (!) 145/70 (!) 165/84 (!) 161/79  Pulse: 77 74 69   Resp: 18 17 13 18   Temp:  98.9 F (37.2 C) 98.2 F (36.8 C)   TempSrc:  Oral Oral   SpO2: 96% 96% 99%   Weight:   137 lb 2 oz (62.2 kg)   Height:        Intake/Output Summary (Last 24 hours) at 10/22/2017 0659 Last data filed at 10/22/2017 0622 Gross per 24 hour  Intake 1250.73 ml  Output 2575 ml  Net -1324.27 ml   Filed Weights   10/20/17 0625 10/21/17 0553 10/22/17 0218  Weight: 135 lb (61.2 kg) 135 lb 12.9 oz (61.6 kg) 137 lb 2 oz (62.2 kg)    Telemetry    Sinus- Personally Reviewed   Physical Exam   GEN: WD/WN No acute distress.   Neck: No JVD, supple Cardiac: RRR Respiratory: Clear to auscultation bilaterally; no wheeze GI: Soft, nontender, non-distended, no masses  MS: No edema; femoral cath site with no hematoma and no bruit Neuro:  Grossly intact   Labs    Chemistry Recent Labs  Lab 10/20/17 0656 10/21/17 0211 10/22/17 0326  NA 137 137  137  K 4.4 3.9 4.0  CL 105 109 107  CO2 19* 19* 21*  GLUCOSE 182* 139* 145*  BUN 25* 23* 21*  CREATININE 1.61* 1.43* 1.41*  CALCIUM 8.6* 8.6* 8.7*  GFRNONAA 42* 48* 49*  GFRAA 49* 56* 57*  ANIONGAP 13 9 9      Hematology Recent Labs  Lab 10/20/17 0656 10/21/17 0211 10/22/17 0326  WBC 3.7* 4.6 4.3  RBC 3.84* 3.85* 3.96*  HGB 12.2* 12.2* 12.3*  HCT 37.1* 36.7* 37.5*  MCV 96.6 95.3 94.7  MCH 31.8 31.7 31.1  MCHC 32.9 33.2 32.8  RDW 14.2 13.5 13.5  PLT 150 154 152    Cardiac Enzymes Recent Labs  Lab 10/20/17 1703 10/20/17 1836 10/21/17 0211  TROPONINI 0.09* 0.13* 0.33*    Recent Labs  Lab 10/20/17 0700  TROPIPOC 0.02     Patient Profile     72 year old male with past medical history of coronary artery disease status post coronary artery bypass and graft, hypertension, hyperlipidemia, chronic stage III kidney disease, HIV, COPD with unstable angina.      Assessment & Plan    1 NSTEMI-Pt has ruled in (troponin 0.33).  Patient  is now status post PCI.  Continue aspirin and statin; Brilinta discontinued because of potential interaction with HIV medications.  Continue Plavix..   2 hyperlipidemia-continue statin.  3 chronic stage III kidney disease-renal function stable this morning.  We will plan to recheck potassium and renal function on Monday, March 25.  4 Hypertension-blood pressure is mildly elevated.    Resume lisinopril 5 mg daily.  Follow renal function and blood pressure as an outpatient.  5 HIV-continue preadmission meds.  Discharge today.  Check BMET 3/25. Transition of care appointment 1 week.  Follow-up with me in 3 months. >30 min PA and physician time D2  For questions or updates, please contact [place] Please consult [email or web address] for contact info under Cardiology/STEMI.      Signed, Kirk Ruths, MD  10/22/2017, 6:59 AM

## 2017-10-22 NOTE — Discharge Summary (Signed)
Discharge Summary    Patient ID: [name],  MRN: [id number], DOB/AGE: 70-Jul-1947 70 y.o.  Admit date: 10/20/2017 Discharge date: 10/22/2017  Primary Care Provider: Dorothyann Peng Primary Cardiologist: Dr Stanford Breed  Discharge Diagnoses    Principal Problem:   NSTEMI (non-ST elevated myocardial infarction) [facility]) Active Problems:   Unstable angina [facility])   Allergies No Known Allergies  Diagnostic Studies/Procedures    CARDIAC CATH: 10/21/2017  Mid LAD lesion is 80% stenosed. LIMA to LAD is patent.  Prox RCA to Mid RCA lesion is 100% stenosed. SVG to PDA is occluded.  Ost Cx to Prox Cx lesion is 100% stenosed.  Ost 2nd Diag to 2nd Diag lesion is 75% stenosed.  A drug-eluting stent was successfully placed using a STENT SYNERGY DES 2.5X20.  Post intervention, there is a 0% residual stenosis.  Mid Graft lesion is 80% stenosed.  A drug-eluting stent was successfully placed using a STENT SYNERGY DES 3X16.  Post intervention, there is a 0% residual stenosis.  Previously placed Prox Graft stent (unknown type) before Ost 2nd Mrg is widely patent. Origin lesion before stent to Ost 2nd Mrg is 90% stenosed.  A drug-eluting stent was successfully placed using a STENT SYNERGY DES 3.5X20, postdilated to > 4 mm.  Post intervention, there is a 0% residual stenosis.  Second part of this jump graft which previously was cballooned in 4/18, is now occluded. Left to left collaterals fill the distal circumflex.  LV end diastolic pressure is low.  There is no aortic valve stenosis.   Successful PCI of SVG to diagonal.  Successful PCI to the proximal graft, SVG to OM jump graft.  Second part of this jump graft which previously was ballooned in 4/18, is now occluded.  Left to left collaterals fill the distal circumflex.  Continue DAPT for one year.  Post cath hydration. Post-Intervention Diagram        _____________   History of Present Illness     72 year old male with  past medical history of coronary artery disease status post coronary artery bypass and graft, hypertension, hyperlipidemia, chronic stage III kidney disease, HIV, COPD.  He was admitted 3/19 with unstable angina>>NSTEMI.   Hospital Course     Consultants: None   He was started on heparin, continued on aspirin and Brilinta as well as a statin.  His ECG had some new T wave inversions, cardiac catheterization was indicated.  His creatinine was elevated at 1.61 so he was hydrated overnight.  His cardiac enzymes elevated, ruling in for non-STEMI.  Because of his HIV meds, his atorvastatin is left at 20 mg daily.  He is on a beta-blocker.  Because of his chronic kidney disease, the lisinopril was held.  His renal function did not change post cath, the lisinopril was resumed.  On 3/20, he was taken to the Cath Lab, results above.  He had 3 drug-eluting stents placed, to the second diagonal, SVG-OM1 and SVG- D2.  He tolerated the procedure well.  On 3/21, he was seen by Dr. Stanford Breed and all data were reviewed.  He is to get a BMET 3/25 and TOC appointment in 1 week.  Because of a potential interaction with his HIV meds, the Brilinta was changed to Plavix.  He was ambulating without chest pain or shortness of breath.  No further inpatient workup is indicated and he is considered stable for discharge, to follow-up as an outpatient. _____________  Discharge Vitals Blood pressure (!) 143/77, pulse 68, temperature 98.1 F (  36.7 C), temperature source Oral, resp. rate 19, height 5\' 3"  (1.6 m), weight 137 lb 2 oz (62.2 kg), SpO2 96 %.  Filed Weights   10/20/17 0625 10/21/17 0553 10/22/17 0218  Weight: 135 lb (61.2 kg) 135 lb 12.9 oz (61.6 kg) 137 lb 2 oz (62.2 kg)    Labs & Radiologic Studies    CBC Recent Labs    10/21/17 0211 10/22/17 0326  WBC 4.6 4.3  HGB 12.2* 12.3*  HCT 36.7* 37.5*  MCV 95.3 94.7  PLT 154 536   Basic Metabolic Panel Recent Labs    10/21/17 0211 10/22/17 0326  NA  137 137  K 3.9 4.0  CL 109 107  CO2 19* 21*  GLUCOSE 139* 145*  BUN 23* 21*  CREATININE 1.43* 1.41*  CALCIUM 8.6* 8.7*   Cardiac Enzymes Recent Labs    10/20/17 1703 10/20/17 1836 10/21/17 0211  TROPONINI 0.09* 0.13* 0.33*   Hemoglobin A1C Lab Results  Component Value Date   HGBA1C 7.1 (H) 10/15/2017   Fasting Lipid Panel Recent Labs    10/21/17 0211  CHOL 140  HDL 28*  LDLCALC 33  TRIG 397*  CHOLHDL 5.0   _____________  Dg Chest 2 View  Result Date: 10/20/2017 CLINICAL DATA:  Chest pain. EXAM: CHEST - 2 VIEW COMPARISON:  05/05/2017 FINDINGS: Post median sternotomy.The cardiomediastinal contours are normal. Coronary artery calcifications or stents. The lungs are clear. Pulmonary vasculature is normal. No consolidation, pleural effusion, or pneumothorax. No acute osseous abnormalities are seen. IMPRESSION: Post median sternotomy without acute abnormality. Electronically Signed   By: Jeb Levering M.D.   On: 10/20/2017 06:58   Disposition   Pt is being discharged home today in good condition.  Follow-up Plans & Appointments    Follow-up Information    [facility] Follow up on 10/26/2017.   Specialty:  Cardiology Why:  Lab work.  Do not have to be fasting.  Okay to take all your usual morning medications. Contact information: [address] [place] [phone]         Discharge Instructions    Diet - low sodium heart healthy   Complete by:  As directed    Diet Carb Modified   Complete by:  As directed    Increase activity slowly   Complete by:  As directed       Discharge Medications   Allergies as of 10/22/2017   No Known Allergies    Allergies as of 10/22/2017   No Known Allergies     Medication List    STOP taking these medications   diclofenac 50 MG EC tablet Commonly known as:  VOLTAREN   ticagrelor 90 MG Tabs tablet Commonly known as:  BRILINTA     TAKE these medications     acetaminophen 325 MG tablet Commonly known as:  TYLENOL Take 2 tablets (650 mg total) by mouth every 4 (four) hours as needed for headache or mild pain.   aspirin 81 MG chewable tablet Chew 81 mg by mouth daily.   atorvastatin 20 MG tablet Commonly known as:  LIPITOR Take 1 tablet (20 mg total) by mouth daily.   b complex vitamins tablet Take 1 tablet by mouth daily.   B-D UF III MINI PEN NEEDLES 31G X 5 MM Misc Generic drug:  Insulin Pen Needle USE TWICE DAILY AS DIRECTED   Cholecalciferol 2000 units Caps Take 1 capsule (2,000 Units total) by mouth daily.   clopidogrel 75 MG  tablet Commonly known as:  PLAVIX Take 1 tablet (75 mg total) by mouth daily.   darunavir-cobicistat 800-150 MG tablet Commonly known as:  PREZCOBIX Take 1 tablet by mouth daily with supper. Swallow whole. Do NOT crush, break or chew tablets. Take with food.   Dolutegravir-Rilpivirine 50-25 MG Tabs Commonly known as:  JULUCA Take 1 tablet by mouth daily with supper.   Fish Oil 1000 MG Caps Take 1 capsule by mouth 2 (two) times daily. Takes 2 daily   fluticasone 50 MCG/ACT nasal spray Commonly known as:  FLONASE Place 2 sprays into both nostrils daily.   freestyle lancets Use as directed three times a day to check blood sugar.  DX E11.9   FREESTYLE TEST STRIPS test strip Generic drug:  glucose blood USE TO CHECK BLOOD SUGAR THREE TIMES DAILY   furosemide 20 MG tablet Commonly known as:  LASIX TAKE 2 TABLETS BY MOUTH DAILY AS NEEDED FOR FLUID RETENTION OR SWELLING What changed:  See the new instructions.   isosorbide mononitrate 30 MG 24 hr tablet Commonly known as:  IMDUR TAKE 1 TABLET(30 MG) BY MOUTH DAILY What changed:  See the new instructions.   lisinopril 5 MG tablet Commonly known as:  PRINIVIL,ZESTRIL Take 1 tablet (5 mg total) by mouth daily. What changed:  how much to take   metFORMIN 500 MG 24 hr tablet Commonly known as:  GLUCOPHAGE-XR TAKE 2 TABLETS(1000 MG) BY MOUTH  DAILY WITH SUPPER What changed:  See the new instructions.   metoprolol tartrate 25 MG tablet Commonly known as:  LOPRESSOR Take 0.5 tablets (12.5 mg total) by mouth 2 (two) times daily.   neomycin-polymyxin-hydrocortisone OTIC solution Commonly known as:  CORTISPORIN INSTILL 3 DROPS IN BOTH EARS THREE TIMES DAILY What changed:  See the new instructions.   nitroGLYCERIN 0.4 MG SL tablet Commonly known as:  NITROSTAT Place 1 tablet (0.4 mg total) under the tongue every 5 (five) minutes as needed for chest pain.   nitroGLYCERIN 0.4 MG SL tablet Commonly known as:  NITROSTAT PLACE 1 TABLET UNDER THE TONGUE EVERY 5 MINUTES AS NEEDED FOR CHEST PAIN   ondansetron 4 MG tablet Commonly known as:  ZOFRAN Take 1 tablet (4 mg total) by mouth every 8 (eight) hours as needed for nausea or vomiting.   pantoprazole 40 MG tablet Commonly known as:  PROTONIX Take 1 tablet (40 mg total) by mouth daily.   pioglitazone 15 MG tablet Commonly known as:  ACTOS TAKE 1 TABLET(15 MG) BY MOUTH DAILY What changed:  See the new instructions.   potassium chloride SA 20 MEQ tablet Commonly known as:  K-DUR,KLOR-CON TAKE 1 TABLET BY MOUTH THREE TIMES DAILY What changed:    how much to take  how to take this  when to take this   PROBIOTIC DAILY Caps Take 1 by mouth daily   repaglinide 2 MG tablet Commonly known as:  PRANDIN TAKE 2 TABLETS BY MOUTH BEFORE BREAKFAST, 1 TABLET BY MOUTH BEFORE LUNCH AND 2 TABLETS BY MOUTH BEFORE SUPPER What changed:  See the new instructions.   sodium bicarbonate 650 MG tablet Take 650 mg by mouth 3 (three) times daily.   TOUJEO SOLOSTAR 300 UNIT/ML Sopn Generic drug:  Insulin Glargine INJECT 10 UNITS UNDER THE SKIN DAILY   triamcinolone cream 0.1 % Commonly known as:  KENALOG apply twice daily as needed to dry, itchy skin   valACYclovir 500 MG tablet Commonly known as:  VALTREX TAKE 1 TABLET BY MOUTH DAILY  Aspirin prescribed at discharge?   Yes High Intensity Statin Prescribed? (Lipitor 40-80mg  or Crestor 20-40mg ): No: HIV meds Beta Blocker Prescribed? Yes For EF <40%, was ACEI/ARB Prescribed? Yes ADP Receptor Inhibitor Prescribed? (i.e. Plavix etc.-Includes Medically Managed Patients): Yes For EF <40%, Aldosterone Inhibitor Prescribed? No: EF higher Was EF assessed during THIS hospitalization? Yes Was Cardiac Rehab II ordered? (Included Medically managed Patients): Yes   Outstanding Labs/Studies   None  Duration of Discharge Encounter   Greater than 30 minutes including physician time.  Alcario Drought Lanaya Bennis NP 10/22/2017, 8:39 AM

## 2017-10-23 NOTE — Telephone Encounter (Signed)
I remember him well. Since his is on a NRTI sparing regimen due to pancreatitis, this is the reason we put him on Prezcobix/Juluca. He is also on a PPI. Previously, Prezcobix was ok with plavix but it looks like new data is saying that the cobicistat could decrease the level of plavix. The recommendation is to sub Plavix with prasugrel or change his antiretrovirals. If the decision is not to change the antiplatelet, the most feasible is to change his current regimen to dual therapy with Tivicay + doravirine. Obviously, there is no data for this regimen but it would be similar to his Juluca. He has a great adherence history. This new regimen would not interact with either Plavix or his PPI.

## 2017-10-26 ENCOUNTER — Encounter: Payer: Self-pay | Admitting: *Deleted

## 2017-10-26 ENCOUNTER — Telehealth: Payer: Self-pay | Admitting: Pharmacist Clinician (PhC)/ Clinical Pharmacy Specialist

## 2017-10-26 ENCOUNTER — Other Ambulatory Visit: Payer: Self-pay | Admitting: *Deleted

## 2017-10-26 ENCOUNTER — Telehealth (HOSPITAL_COMMUNITY): Payer: Self-pay

## 2017-10-26 DIAGNOSIS — N183 Chronic kidney disease, stage 3 unspecified: Secondary | ICD-10-CM

## 2017-10-26 NOTE — Telephone Encounter (Signed)
Patients insurance is active and benefits verified through Medicare A/B - No co-pay, deductible amount of $185.00/$105.55 has been met, no out of pocket, 20% co-insurance, and no pre-authorization is required. Passport/reference [id number]  Will contact patient to see if interested in CR. If interested, patient will need to complete follow up appt. Once completed, patient will be contacted for scheduling upon review by the RN Navigator.

## 2017-10-26 NOTE — Telephone Encounter (Signed)
Spoke with [name] today to bring him in to change his antitretrovirals to doravirine/dolutegravir due to the interaction with his antiplatelet. He will come in this Wed.

## 2017-10-26 NOTE — Telephone Encounter (Signed)
This encounter was created in error - please disregard.

## 2017-10-27 LAB — BASIC METABOLIC PANEL
BUN/Creatinine Ratio: 16 (ref 10–24)
BUN: 23 mg/dL (ref 8–27)
CALCIUM: 9.1 mg/dL (ref 8.6–10.2)
CO2: 22 mmol/L (ref 20–29)
Chloride: 105 mmol/L (ref 96–106)
Creatinine, Ser: 1.43 mg/dL — ABNORMAL HIGH (ref 0.76–1.27)
GFR calc Af Amer: 57 mL/min/{1.73_m2} — ABNORMAL LOW (ref >59)
GFR calc non Af Amer: 50 mL/min/{1.73_m2} — ABNORMAL LOW (ref >59)
GLUCOSE: 169 mg/dL — AB (ref 65–99)
Potassium: 4.9 mmol/L (ref 3.5–5.2)
SODIUM: 142 mmol/L (ref 134–144)

## 2017-10-28 ENCOUNTER — Telehealth (HOSPITAL_COMMUNITY): Payer: Self-pay

## 2017-10-28 ENCOUNTER — Ambulatory Visit (INDEPENDENT_AMBULATORY_CARE_PROVIDER_SITE_OTHER): Payer: Medicare Other | Admitting: Pharmacist Clinician (PhC)/ Clinical Pharmacy Specialist

## 2017-10-28 DIAGNOSIS — B20 Human immunodeficiency virus [HIV] disease: Secondary | ICD-10-CM | POA: Diagnosis not present

## 2017-10-28 MED ORDER — DOLUTEGRAVIR SODIUM 50 MG PO TABS: 50.0000 mg | ORAL_TABLET | Freq: Every day | ORAL | 11 refills | Status: DC

## 2017-10-28 MED ORDER — DORAVIRINE 100 MG PO TABS: 100.0000 mg | ORAL_TABLET | Freq: Every day | ORAL | 11 refills | Status: DC

## 2017-10-28 NOTE — Progress Notes (Signed)
HPI: [name] is a 72 y.o. male who is here to see pharmacy to change his antiretrovirals.   Allergies: No Known Allergies  Vitals:    Past Medical History: Past Medical History:  Diagnosis Date  . Absolute anemia 05/28/2014  . Aortic stenosis 09/17/2015  . Arthritis   . Ascites 11/18/2013  . BELLS PALSY 07/19/2010   Qualifier: Diagnosis of  By: [name], [name]    . CAD (coronary artery disease)    a. s/p CABG in 2003 b. s/p PCI to SVG-PDA in 07/2015 c. 11/2016: cath showing severe native CAD with patent LIMA-LAD and SVG-D1 with 80% stenosis of SVG-OM1-OM2. Initially medical management was recommended --> presented with recurrent angina --> s/p Synergy DES to proximal body of SVG-OM1-OM2, POBA to distal graft.   Marland Kitchen CAD- S/P PCI SVG-OM1 12/01/16 05/12/2006   Qualifier: Diagnosis of  By: [name], [name]    . Carotid bruit 07/10/2015  . Chest pain 11/22/2016  . Chronic kidney disease   . Chronic renal insufficiency, stage III (moderate) ([place]) 12/02/2016  . Constipation 05/28/2014  . COPD 07/20/2008   Qualifier: Diagnosis of  By: [name], [name]   . DEPRESSION 09/03/2006   Qualifier: Diagnosis of  By: [name], [name]    . Dermatitis 11/27/2012  . Diabetes mellitus   . Diarrhea 11/20/2013  . DM (diabetes mellitus), type 2 with ophthalmic complications ([place]) 11/10/1446   Qualifier: Diagnosis of  By: [name], [name]    . Dry eye syndrome 12/20/2010  . Epicondylitis 06/05/2013   right  . Erectile dysfunction 01/01/2012  . Essential hypertension 05/12/2006   Qualifier: Diagnosis of  By: [name], [name]    . GENITAL HERPES 05/03/2009   Qualifier: Diagnosis of  By: [name], [name]    . GERD 09/03/2006   Qualifier: Diagnosis of  By: [name], [name]    . GERD (gastroesophageal reflux disease)   . HEARING LOSS, SENSORINEURAL 05/12/2006   Qualifier: Diagnosis of  By: [name], [name]    . HEMATOCHEZIA 04/18/2008   Annotation: 9/09 during bout of constipation Qualifier: Diagnosis of   By: [name], [name]    . HIP PAIN, BILATERAL 07/17/2008   Qualifier: Diagnosis of  By: [name], [name]   . History of depression   . History of kidney stones   . HIV (human immunodeficiency virus infection) ([place]) 1991   on meds since initial dx.   Marland Kitchen HLD (hyperlipidemia) 11/19/2013  . Human immunodeficiency virus (HIV) disease ([place]) 05/12/2006   Qualifier: Diagnosis of  By: [name], [name]    . Hx of CABG 09/03/2006   Annotation: 2003 Qualifier: Diagnosis of  By: [name], [name]    . Hyperkalemia 03/23/2014  . Hyperlipidemia   . Hypertension   . Hyponatremia 03/23/2014  . INGUINAL LYMPHADENOPATHY, RIGHT 04/03/2009   Qualifier: Diagnosis of  By: [name], [name]    . Keratoma 01/24/2015  . KNEE PAIN, BILATERAL 07/17/2008   Qualifier: Diagnosis of  By: [name], [name]   . Lesion of breast 07/21/2015  . Lipodystrophy 12/20/2010  . Memory loss 09/18/2008   Qualifier: Diagnosis of  By: [name], [name]   . Metatarsal deformity 01/24/2015  . Nasal abscess 12/04/2015  . Night sweats 07/06/2012  . Nocturia 03/06/2013  . NSTEMI (non-ST elevated myocardial infarction) ([place]) 11/29/2016  . Pain in joint, ankle and foot 01/19/2015  . Pancreatitis 11/2013   attributed to HIV meds.   . Pedal edema  05/21/2014  . PERIPHERAL VASCULAR DISEASE 07/20/2008   Qualifier: Diagnosis of  By: [name], [name]   . Posterior cervical lymphadenopathy 03/30/2014  . Protein-calorie malnutrition, severe ([place]) 03/23/2014  . SHINGLES, HX OF 05/03/2009   Annotation: R leg Qualifier: Diagnosis of  By: [name], [name]    . STEMI (ST elevation myocardial infarction) ([place]) 07/29/2015  . Unstable angina ([place]) 11/24/2016  . WEIGHT LOSS, ABNORMAL 04/03/2009   Qualifier: Diagnosis of  By: [name], [name]      Social History: Social History   Socioeconomic History  . Marital status: Divorced    Spouse name: Not on file  . Number of children: 4  . Years of education: Not on file  . Highest education level: Not on file   Occupational History  . 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   Social Needs  . Financial resource strain: Not on file  . Food insecurity:    Worry: Not on file    Inability: Not on file  . Transportation needs:    Medical: Not on file    Non-medical: Not on file  Tobacco Use  . Smoking status: Never Smoker  . Smokeless tobacco: Never Used  Substance and Sexual Activity  . Alcohol use: Yes    Alcohol/week: 0.0 oz    Comment: rare  . Drug use: No  . Sexual activity: Not Currently    Comment: declined condoms  Lifestyle  . Physical activity:    Days per week: Not on file    Minutes per session: Not on file  . Stress: Not on file  Relationships  . Social connections:    Talks on phone: Not on file    Gets together: Not on file    Attends religious service: Not on file    Active member of club or organization: Not on file    Attends meetings of clubs or organizations: Not on file    Relationship status: Not on file  Other Topics Concern  . Not on file  Social History Narrative   Lives alone.  Supportive friends and family.  His HIV Dx is not a secret.     Previous Regimen: NVP/CBV>>pancreatitis>>Stribild>>pancreatitis>>DRV/r/DTG/RPV  Current Regimen: Prezcobix/Juluca  Labs: HIV 1 RNA Quant (copies/mL)  Date Value  07/09/2017 <20 DETECTED (A)  01/05/2017 <20 DETECTED (A)  04/01/2016 <20   HIV-1 RNA Viral Load (no units)  Date Value  07/14/2013 <40  04/13/2013 <40  01/18/2013 <40   CD4 (no units)  Date Value  07/14/2013 514  04/13/2013 498  01/18/2013 522   CD4 T Cell Abs (/uL)  Date Value  07/09/2017 490  01/05/2017 370 (L)  04/01/2016 500   Hep B S Ab (no units)  Date Value  03/24/2014 POSITIVE (A)   Hepatitis B Surface Ag (no units)  Date Value  03/24/2014 NEGATIVE   HCV Ab (no units)  Date Value  09/29/2012 NEGATIVE    CrCl: Estimated Creatinine Clearance: 39.2 mL/min (A) (by  C-G formula based on SCr of 1.43 mg/dL (H)).  Lipids:    Component Value Date/Time   CHOL 140 10/21/2017 0211   TRIG 397 (H) 10/21/2017 0211   HDL 28 (L) 10/21/2017 0211   CHOLHDL 5.0 10/21/2017 0211   VLDL 79 (H) 10/21/2017 0211   LDLCALC 33 10/21/2017 0211    Assessment: Jovoni is here today to see pharmacy to change his antiretrovirals because of interactions. He was previously  on Brilinta for his CAD and was subsequently switch to Plavix after his recent cath due to the interaction with his Brilinta. Devlin's case is very complicated due adverse effects and drug interactions. He is on NRTI sparing regimen because of his hx with pancreatitis. We have to avoid certain meds because of his dependent on a PPI (rilpivirine). This is when we decided to add Prezcobix to his Juluca to provide the other active therapy. Cobicistat used to be ok with plavix but recent data has shown that it may decrease his plavix. Therefore, we are going to change him to a dual regimen of Tivicay/Doravirine. Doravirine wasn't available previously. Doravirine and Tiivicay would not an issue with either Plavix or PPI. The new regimen has been sent to [facility] because he has SPAP. He'll pick up today there. Normally, we would bring him back in 4-6 wks for labs but he has an up coming appt with Dr. Megan Salon in June and a labs visit 2 weeks prior to that.   Recommendations:  Stop Prezcobix/Juluca Start Doravirine 100mg  PO qday Start Tivicay 50mg  PO qday F/u with labs in June before visit with Dr. Gladys Damme, PharmD, BCPS, AAHIVP, CPP Clinical Infectious Disease Pharmacist [place] [facility] 10/28/2017, 12:45 PM

## 2017-10-28 NOTE — Telephone Encounter (Signed)
Called patient to see if interested in CR - Patient stated he is giving it serious consideration. Will call to follow up with patient after he completes his follow up appt.

## 2017-10-30 ENCOUNTER — Encounter: Payer: Self-pay | Admitting: Adult Health

## 2017-11-01 ENCOUNTER — Encounter: Payer: Self-pay | Admitting: Endocrinology

## 2017-11-02 ENCOUNTER — Other Ambulatory Visit: Payer: Self-pay

## 2017-11-02 DIAGNOSIS — I214 Non-ST elevation (NSTEMI) myocardial infarction: Secondary | ICD-10-CM

## 2017-11-03 ENCOUNTER — Other Ambulatory Visit: Payer: Self-pay

## 2017-11-03 ENCOUNTER — Encounter: Payer: Self-pay | Admitting: Physician Assistant

## 2017-11-03 ENCOUNTER — Ambulatory Visit (INDEPENDENT_AMBULATORY_CARE_PROVIDER_SITE_OTHER): Payer: Medicare Other | Admitting: Physician Assistant

## 2017-11-03 VITALS — BP 122/68 | HR 55 | Ht 63.0 in | Wt 139.2 lb

## 2017-11-03 DIAGNOSIS — Z955 Presence of coronary angioplasty implant and graft: Secondary | ICD-10-CM

## 2017-11-03 DIAGNOSIS — E785 Hyperlipidemia, unspecified: Secondary | ICD-10-CM

## 2017-11-03 DIAGNOSIS — E119 Type 2 diabetes mellitus without complications: Secondary | ICD-10-CM | POA: Diagnosis not present

## 2017-11-03 DIAGNOSIS — I739 Peripheral vascular disease, unspecified: Secondary | ICD-10-CM

## 2017-11-03 DIAGNOSIS — I779 Disorder of arteries and arterioles, unspecified: Secondary | ICD-10-CM | POA: Diagnosis not present

## 2017-11-03 DIAGNOSIS — N183 Chronic kidney disease, stage 3 unspecified: Secondary | ICD-10-CM

## 2017-11-03 DIAGNOSIS — I25810 Atherosclerosis of coronary artery bypass graft(s) without angina pectoris: Secondary | ICD-10-CM | POA: Diagnosis not present

## 2017-11-03 DIAGNOSIS — B20 Human immunodeficiency virus [HIV] disease: Secondary | ICD-10-CM

## 2017-11-03 DIAGNOSIS — I10 Essential (primary) hypertension: Secondary | ICD-10-CM | POA: Diagnosis not present

## 2017-11-03 MED ORDER — FREESTYLE LANCETS MISC: 5 refills | Status: AC

## 2017-11-03 MED ORDER — FENOFIBRATE 54 MG PO TABS: 54.0000 mg | ORAL_TABLET | Freq: Every day | ORAL | 3 refills | Status: DC

## 2017-11-03 NOTE — Patient Instructions (Addendum)
Medication Instructions:   START fenofibrate once daily  Labwork:  BMET TODAY FASTING lab work in 2 months to check liver function & cholesterol (prior to visit with Dr. Stanford Breed)  Testing/Procedures:  NONE  Follow-Up:  Your physician recommends that you schedule a follow-up appointment in: 2-3 months with Dr. Stanford Breed.   If you need a refill on your cardiac medications before your next appointment, please call your pharmacy.  Any Other Special Instructions Will Be Listed Below (If Applicable).

## 2017-11-03 NOTE — Progress Notes (Signed)
Cardiology Office Note    Date:  11/05/2017   ID:  Alixander, Rallis Dec 09, 1945, MRN [id number]  PCP:  Dorothyann Peng, NP  Cardiologist:  Dr. Stanford Breed   Chief Complaint  Patient presents with  . Hospitalization Follow-up    recent NSTEMI, seen for Dr. Stanford Breed    History of Present Illness:  [name] is a 72 y.o. male with PMH of CAD s/p CABG 2003, carotid disease, CKD stage III, COPD, HTN, DM II, HIV, HLD, and h/o shingles.  Carotid Doppler in 2016 showed 1-39% bilateral disease. Echocardiogram in April 2018 showed normal LV function, grade 2 DD, akinesis of the inferior and inferoseptal myocardium, trace MR, calcified aortic valve but no significant aortic stenosis.  Cardiac catheterization in April 2018 showed severe three-vessel disease, patent LIMA to LAD, patent SVG to diagonal with 80-90% stenosis in the diagonal distal to the anastomosis, patent SVG to OM1 and OM 2 with 80-90% stenosis in the jump graft immediately distal to the OM1 anastomosis, occluded SVG to PDA.  He subsequently had successful PCI of sequential SVG to ramus and OM branches.  More recently, patient presented with NSTEMI on 10/20/2017.  Echocardiogram obtained on the day showed EF 50-55%, grade 1 DD, mild MR, PA peak pressure 35 mmHg.  He eventually underwent repeat cardiac catheterization on 10/21/2017 and underwent successful PCI of SVG to diagonal and also a proximal portion of SVG to OM graft.  The previously ballooned second part of SVG to obtuse marginal jump graft is now occluded, there were left to left collaterals filling the distal left circumflex artery.  Post-cath, patient was placed on aspirin and plavix for 1 year.  Patient presents today for cardiology office visit.  He has been compliant with his aspirin and Plavix.  He denies any recurrent chest pain.  I will obtain a basic metabolic panel to make sure his renal function is stable.  Review of recent lab work shows her triglyceride is extremely  elevated at close to 400, I will add low-dose fenofibrate.  He will continue on the current fish oil.  He will need a 77-month repeat fasting lipid panel and LFT.  He also mentions he has been taking Lasix 20 mg on a daily basis instead of as needed, he says when he is not taking the Lasix, he does notice more swelling.  I was surprised by the progression of his disease in less than 1 year, however I think treating the cholesterol will help.  His HDL is chronically low, I asked him to increase activity level as well.   Past Medical History:  Diagnosis Date  . Absolute anemia 05/28/2014  . Aortic stenosis 09/17/2015  . Arthritis   . Ascites 11/18/2013  . BELLS PALSY 07/19/2010   Qualifier: Diagnosis of  By: [name], [name]    . CAD (coronary artery disease)    a. s/p CABG in 2003 b. s/p PCI to SVG-PDA in 07/2015 c. 11/2016: cath showing severe native CAD with patent LIMA-LAD and SVG-D1 with 80% stenosis of SVG-OM1-OM2. Initially medical management was recommended --> presented with recurrent angina --> s/p Synergy DES to proximal body of SVG-OM1-OM2, POBA to distal graft.   Marland Kitchen CAD- S/P PCI SVG-OM1 12/01/16 05/12/2006   Qualifier: Diagnosis of  By: [name], [name]    . Carotid bruit 07/10/2015  . Chest pain 11/22/2016  . Chronic kidney disease   . Chronic renal insufficiency, stage III (moderate) ([place]) 12/02/2016  . Constipation 05/28/2014  .  COPD 07/20/2008   Qualifier: Diagnosis of  By: [name], [name]   . DEPRESSION 09/03/2006   Qualifier: Diagnosis of  By: [name], [name]    . Dermatitis 11/27/2012  . Diabetes mellitus   . Diarrhea 11/20/2013  . DM (diabetes mellitus), type 2 with ophthalmic complications ([place]) 3/32/9518   Qualifier: Diagnosis of  By: [name], [name]    . Dry eye syndrome 12/20/2010  . Epicondylitis 06/05/2013   right  . Erectile dysfunction 01/01/2012  . Essential hypertension 05/12/2006   Qualifier: Diagnosis of  By: [name], [name]    . GENITAL HERPES 05/03/2009    Qualifier: Diagnosis of  By: [name], [name]    . GERD 09/03/2006   Qualifier: Diagnosis of  By: [name], [name]    . GERD (gastroesophageal reflux disease)   . HEARING LOSS, SENSORINEURAL 05/12/2006   Qualifier: Diagnosis of  By: [name], [name]    . HEMATOCHEZIA 04/18/2008   Annotation: 9/09 during bout of constipation Qualifier: Diagnosis of  By: [name], [name]    . HIP PAIN, BILATERAL 07/17/2008   Qualifier: Diagnosis of  By: [name], [name]   . History of depression   . History of kidney stones   . HIV (human immunodeficiency virus infection) ([place]) 1991   on meds since initial dx.   Marland Kitchen HLD (hyperlipidemia) 11/19/2013  . Human immunodeficiency virus (HIV) disease ([place]) 05/12/2006   Qualifier: Diagnosis of  By: [name], [name]    . Hx of CABG 09/03/2006   Annotation: 2003 Qualifier: Diagnosis of  By: [name], [name]    . Hyperkalemia 03/23/2014  . Hyperlipidemia   . Hypertension   . Hyponatremia 03/23/2014  . INGUINAL LYMPHADENOPATHY, RIGHT 04/03/2009   Qualifier: Diagnosis of  By: [name], [name]    . Keratoma 01/24/2015  . KNEE PAIN, BILATERAL 07/17/2008   Qualifier: Diagnosis of  By: [name], [name]   . Lesion of breast 07/21/2015  . Lipodystrophy 12/20/2010  . Memory loss 09/18/2008   Qualifier: Diagnosis of  By: [name], [name]   . Metatarsal deformity 01/24/2015  . Nasal abscess 12/04/2015  . Night sweats 07/06/2012  . Nocturia 03/06/2013  . NSTEMI (non-ST elevated myocardial infarction) ([place]) 11/29/2016  . Pain in joint, ankle and foot 01/19/2015  . Pancreatitis 11/2013   attributed to HIV meds.   . Pedal edema 05/21/2014  . PERIPHERAL VASCULAR DISEASE 07/20/2008   Qualifier: Diagnosis of  By: [name], [name]   . Posterior cervical lymphadenopathy 03/30/2014  . Protein-calorie malnutrition, severe ([place]) 03/23/2014  . SHINGLES, HX OF 05/03/2009   Annotation: R leg Qualifier: Diagnosis of  By: [name], [name]    . STEMI (ST elevation myocardial infarction) ([place])  07/29/2015  . Unstable angina ([place]) 11/24/2016  . WEIGHT LOSS, ABNORMAL 04/03/2009   Qualifier: Diagnosis of  By: [name], [name]      Past Surgical History:  Procedure Laterality Date  . CARDIAC CATHETERIZATION N/A 07/29/2015   Procedure: Left Heart Cath and Coronary Angiography;  Surgeon: [name], MD;  Location: [facility];  Service: Cardiovascular;  Laterality: N/A;  . CARDIAC CATHETERIZATION N/A 07/29/2015   Procedure: Coronary Stent Intervention;  Surgeon: [name], MD;  Location: [facility];  Service: Cardiovascular;  Laterality: N/A;  . CORONARY ARTERY BYPASS GRAFT  05/2002  . CORONARY STENT INTERVENTION N/A 12/01/2016   Procedure: Coronary Stent Intervention;  Surgeon: [name], MD;  Location: [facility];  Service: Cardiovascular;  Laterality: N/A;  . CORONARY STENT INTERVENTION N/A 10/21/2017   Procedure: CORONARY STENT INTERVENTION;  Surgeon: Jettie Booze, MD;  Location: [facility];  Service: Cardiovascular;  Laterality: N/A;  . LEFT HEART CATH AND CORS/GRAFTS ANGIOGRAPHY N/A 11/24/2016   Procedure: Left Heart Cath and Cors/Grafts Angiography;  Surgeon: Nelva Bush, MD;  Location: [facility];  Service: Cardiovascular;  Laterality: N/A;  . LEFT HEART CATH AND CORS/GRAFTS ANGIOGRAPHY N/A 12/01/2016   Procedure: Left Heart Cath and Cors/Grafts Angiography;  Surgeon: [name], MD;  Location: [facility];  Service: Cardiovascular;  Laterality: N/A;  . LEFT HEART CATH AND CORS/GRAFTS ANGIOGRAPHY N/A 10/21/2017   Procedure: LEFT HEART CATH AND CORS/GRAFTS ANGIOGRAPHY;  Surgeon: Jettie Booze, MD;  Location: [facility];  Service: Cardiovascular;  Laterality: N/A;  . VASECTOMY      Current Medications: Outpatient Medications Prior to Visit  Medication Sig Dispense Refill  . acetaminophen (TYLENOL) 325 MG tablet Take 2 tablets (650 mg total) by mouth every 4 (four) hours as needed for  headache or mild pain.    Marland Kitchen aspirin 81 MG chewable tablet Chew 81 mg by mouth daily.    Marland Kitchen atorvastatin (LIPITOR) 20 MG tablet Take 1 tablet (20 mg total) by mouth daily. 90 tablet 3  . b complex vitamins tablet Take 1 tablet by mouth daily. 30 tablet 11  . Cholecalciferol 2000 units CAPS Take 1 capsule (2,000 Units total) by mouth daily. 30 each 11  . clopidogrel (PLAVIX) 75 MG tablet Take 1 tablet (75 mg total) by mouth daily. 30 tablet 11  . dolutegravir (TIVICAY) 50 MG tablet Take 1 tablet (50 mg total) by mouth daily. 30 tablet 11  . doravirine (PIFELTRO) 100 MG TABS tablet Take 1 tablet (100 mg total) by mouth daily. 30 tablet 11  . FREESTYLE TEST STRIPS test strip USE TO CHECK BLOOD SUGAR THREE TIMES DAILY 300 each 3  . furosemide (LASIX) 20 MG tablet TAKE 2 TABLETS BY MOUTH DAILY AS NEEDED FOR FLUID RETENTION OR SWELLING (Patient taking differently: TAKE 1 TABLETS BY MOUTH DAILY AS NEEDED FOR FLUID RETENTION OR SWELLING) 90 tablet 0  . isosorbide mononitrate (IMDUR) 30 MG 24 hr tablet TAKE 1 TABLET(30 MG) BY MOUTH DAILY (Patient taking differently: 15mg  by mouth twice daily) 90 tablet 2  . lisinopril (PRINIVIL,ZESTRIL) 5 MG tablet Take 1 tablet (5 mg total) by mouth daily. 90 tablet 3  . metoprolol tartrate (LOPRESSOR) 25 MG tablet Take 0.5 tablets (12.5 mg total) by mouth 2 (two) times daily. 60 tablet 5  . neomycin-polymyxin-hydrocortisone (CORTISPORIN) otic solution INSTILL 3 DROPS IN BOTH EARS THREE TIMES DAILY (Patient taking differently: 3 drops into both ears daily as needed) 10 mL 0  . nitroGLYCERIN (NITROSTAT) 0.4 MG SL tablet Place 1 tablet (0.4 mg total) under the tongue every 5 (five) minutes as needed for chest pain. 25 tablet 3  . Omega-3 Fatty Acids (FISH OIL) 1000 MG CAPS Take 1 capsule by mouth 2 (two) times daily. Takes 2 daily     . ondansetron (ZOFRAN) 4 MG tablet Take 1 tablet (4 mg total) by mouth every 8 (eight) hours as needed for nausea or vomiting. 20 tablet 0  .  pantoprazole (PROTONIX) 40 MG tablet Take 1 tablet (40 mg total) by mouth daily. 30 tablet 5  . pioglitazone (ACTOS) 15 MG tablet TAKE 1 TABLET(15 MG) BY MOUTH DAILY (Patient taking  differently: 15mg  by mouth once daily) 30 tablet 0  . potassium chloride SA (K-DUR,KLOR-CON) 20 MEQ tablet TAKE 1 TABLET BY MOUTH THREE TIMES DAILY (Patient taking differently: 86meq by mouth three times daily) 270 tablet 0  . Probiotic Product (PROBIOTIC DAILY) CAPS Take 1 by mouth daily 30 capsule 11  . repaglinide (PRANDIN) 2 MG tablet TAKE 2 TABLETS BY MOUTH BEFORE BREAKFAST, 1 TABLET BY MOUTH BEFORE LUNCH AND 2 TABLETS BY MOUTH BEFORE SUPPER (Patient taking differently: 50mg  by mouth before breakfast, 25mg  before lunch, 50mg  before dinner) 450 tablet 0  . sodium bicarbonate 650 MG tablet Take 650 mg by mouth 3 (three) times daily.    Marland Kitchen triamcinolone cream (KENALOG) 0.1 % apply twice daily as needed to dry, itchy skin  2  . valACYclovir (VALTREX) 500 MG tablet TAKE 1 TABLET BY MOUTH DAILY 30 tablet 4  . Lancets (FREESTYLE) lancets Use as directed three times a day to check blood sugar.  DX E11.9 100 each 11  . metFORMIN (GLUCOPHAGE-XR) 500 MG 24 hr tablet TAKE 2 TABLETS(1000 MG) BY MOUTH DAILY WITH SUPPER (Patient taking differently: 500mg  by mouth every evening) 60 tablet 0  . B-D UF III MINI PEN NEEDLES 31G X 5 MM MISC USE TWICE DAILY AS DIRECTED 100 each 0  . fluticasone (FLONASE) 50 MCG/ACT nasal spray Place 2 sprays into both nostrils daily. (Patient not taking: Reported on 07/23/2017) 16 g 6  . nitroGLYCERIN (NITROSTAT) 0.4 MG SL tablet PLACE 1 TABLET UNDER THE TONGUE EVERY 5 MINUTES AS NEEDED FOR CHEST PAIN (Patient not taking: Reported on 10/20/2017) 25 tablet 9  . TOUJEO SOLOSTAR 300 UNIT/ML SOPN INJECT 10 UNITS UNDER THE SKIN DAILY 4.5 mL 2   No facility-administered medications prior to visit.      Allergies:   Patient has no known allergies.   Social History   Socioeconomic History  . Marital status:  Divorced    Spouse name: Not on file  . Number of children: 4  . Years of education: Not on file  . Highest education level: Not on file  Occupational History  . 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   Social Needs  . Financial resource strain: Not on file  . Food insecurity:    Worry: Not on file    Inability: Not on file  . Transportation needs:    Medical: Not on file    Non-medical: Not on file  Tobacco Use  . Smoking status: Never Smoker  . Smokeless tobacco: Never Used  Substance and Sexual Activity  . Alcohol use: Yes    Alcohol/week: 0.0 oz    Comment: rare  . Drug use: No  . Sexual activity: Not Currently    Comment: declined condoms  Lifestyle  . Physical activity:    Days per week: Not on file    Minutes per session: Not on file  . Stress: Not on file  Relationships  . Social connections:    Talks on phone: Not on file    Gets together: Not on file    Attends religious service: Not on file    Active member of club or organization: Not on file    Attends meetings of clubs or organizations: Not on file    Relationship status: Not on file  Other Topics Concern  . Not on file  Social History Narrative   Lives alone.  Supportive friends and family.  His HIV Dx  is not a secret.      Family History:  The patient's family history includes Arthritis in his unknown relative; Breast cancer in his maternal aunt; Diabetes in his mother; Heart disease in his unknown relative; Hyperlipidemia in his father and mother; Hypertension in his father; Lung cancer in his maternal aunt; Mental retardation in his sister; Stroke in his paternal grandmother.   ROS:   Please see the history of present illness.    ROS All other systems reviewed and are negative.   PHYSICAL EXAM:   VS:  BP 122/68   Pulse (!) 55   Ht 5\' 3"  (1.6 m)   Wt 139 lb 3.2 oz (63.1 kg)   BMI 24.66 kg/m    GEN: Well nourished, well developed, in no  acute distress  HEENT: normal  Neck: no JVD, carotid bruits, or masses Cardiac: RRR; no murmurs, rubs, or gallops,no edema  Respiratory:  clear to auscultation bilaterally, normal work of breathing GI: soft, nontender, nondistended, + BS MS: no deformity or atrophy  Skin: warm and dry, no rash Neuro:  Alert and Oriented x 3, Strength and sensation are intact Psych: euthymic mood, full affect  Wt Readings from Last 3 Encounters:  11/03/17 139 lb 3.2 oz (63.1 kg)  10/22/17 137 lb 2 oz (62.2 kg)  07/23/17 135 lb (61.2 kg)      Studies/Labs Reviewed:   EKG:  EKG is ordered today.  The ekg ordered today demonstrates sinus bradycardia, heart rate 55.  Minimal T wave inversion in lead V5 and V6.  Recent Labs: 07/10/2017: ALT 18 10/22/2017: Hemoglobin 12.3; Platelets 152 11/03/2017: BUN 24; Creatinine, Ser 1.54; Potassium 4.7; Sodium 146   Lipid Panel    Component Value Date/Time   CHOL 140 10/21/2017 0211   TRIG 397 (H) 10/21/2017 0211   HDL 28 (L) 10/21/2017 0211   CHOLHDL 5.0 10/21/2017 0211   VLDL 79 (H) 10/21/2017 0211   LDLCALC 33 10/21/2017 0211   LDLDIRECT 72.0 11/04/2016 0909    Additional studies/ records that were reviewed today include:   Echo 10/20/2017 LV EF: 50% -   55% Study Conclusions  - Left ventricle: The cavity size was normal. Wall thickness was   normal. Systolic function was normal. The estimated ejection   fraction was in the range of 50% to 55%. Wall motion was normal;   there were no regional wall motion abnormalities. Doppler   parameters are consistent with abnormal left ventricular   relaxation (grade 1 diastolic dysfunction). - Mitral valve: Mildly to moderately calcified annulus. There was   mild regurgitation. - Pulmonary arteries: Systolic pressure was mildly increased. PA   peak pressure: 35 mm Hg (S).     Cath 10/21/2017 Conclusion     Mid LAD lesion is 80% stenosed. LIMA to LAD is patent.  Prox RCA to Mid RCA lesion is 100%  stenosed. SVG to PDA is occluded.  Ost Cx to Prox Cx lesion is 100% stenosed.  Ost 2nd Diag to 2nd Diag lesion is 75% stenosed.  A drug-eluting stent was successfully placed using a STENT SYNERGY DES 2.5X20.  Post intervention, there is a 0% residual stenosis.  Mid Graft lesion is 80% stenosed.  A drug-eluting stent was successfully placed using a STENT SYNERGY DES 3X16.  Post intervention, there is a 0% residual stenosis.  Previously placed Prox Graft stent (unknown type) before Ost 2nd Mrg is widely patent. Origin lesion before stent to Ost 2nd Mrg is 90% stenosed.  A drug-eluting stent  was successfully placed using a STENT SYNERGY DES 3.5X20, postdilated to > 4 mm.  Post intervention, there is a 0% residual stenosis.  Second part of this jump graft which previously was cballooned in 4/18, is now occluded. Left to left collaterals fill the distal circumflex.  LV end diastolic pressure is low.  There is no aortic valve stenosis.   Successful PCI of SVG to diagonal.  Successful PCI to the proximal graft, SVG to OM jump graft.  Second part of this jump graft which previously was ballooned in 4/18, is now occluded.  Left to left collaterals fill the distal circumflex.  Continue DAPT for one year.   Post cath hydration.       ASSESSMENT:    1. Coronary artery disease involving coronary bypass graft of native heart without angina pectoris   2. CKD (chronic kidney disease) stage 3, GFR 30-59 ml/min ([facility])   3. S/P drug eluting coronary stent placement   4. Hyperlipidemia, unspecified hyperlipidemia type   5. Carotid artery disease, unspecified laterality, unspecified type ([place])   6. Essential hypertension   7. HIV infection, unspecified symptom status ([place])   8. Controlled type 2 diabetes mellitus without complication, without long-term current use of insulin ([facility])      PLAN:  In order of problems listed above:  1. CAD s/p CABG: Recent PCI to SVG to ramus and OM.   Continue aspirin and Plavix.  Discussed the need to be compliant with dual antiplatelet therapy.  Will need to continue aspirin and Plavix for at least one year.  2. Carotid artery disease: Only mild disease on last carotid ultrasound 07/18/2015.  No carotid bruit on physical exam  3. Hypertension: Blood pressure very well controlled.  On Imdur, lisinopril and the metoprolol.  Obtain basic metabolic panel.  4. Hyperlipidemia: Triglyceride close to 400, given the significant progression of coronary artery disease in the past year, will add fenofibrate.  Continue fish oil and Lipitor.  Fasting lipid panel and LFTs in 2 months.  5. DM 2: Managed by primary care provider.  On metformin  6. HIV: Managed by infectious disease.  7. CKD stage III: Obtain basic metabolic panel.  He is taking Lasix on a daily basis instead of as needed.    Medication Adjustments/Labs and Tests Ordered: Current medicines are reviewed at length with the patient today.  Concerns regarding medicines are outlined above.  Medication changes, Labs and Tests ordered today are listed in the Patient Instructions below. Patient Instructions  Medication Instructions:   START fenofibrate once daily  Labwork:  BMET TODAY FASTING lab work in 2 months to check liver function & cholesterol (prior to visit with Dr. Stanford Breed)  Testing/Procedures:  NONE  Follow-Up:  Your physician recommends that you schedule a follow-up appointment in: 2-3 months with Dr. Stanford Breed.   If you need a refill on your cardiac medications before your next appointment, please call your pharmacy.  Any Other Special Instructions Will Be Listed Below (If Applicable).       Hilbert Corrigan, [place]  11/05/2017 10:28 PM    [facility] [place], [place], [place]  [address] Phone: [phone]; Fax: [phone]

## 2017-11-04 ENCOUNTER — Other Ambulatory Visit: Payer: Self-pay

## 2017-11-04 LAB — BASIC METABOLIC PANEL
BUN/Creatinine Ratio: 16 (ref 10–24)
BUN: 24 mg/dL (ref 8–27)
CALCIUM: 8.9 mg/dL (ref 8.6–10.2)
CO2: 21 mmol/L (ref 20–29)
Chloride: 109 mmol/L — ABNORMAL HIGH (ref 96–106)
Creatinine, Ser: 1.54 mg/dL — ABNORMAL HIGH (ref 0.76–1.27)
GFR, EST AFRICAN AMERICAN: 52 mL/min/{1.73_m2} — AB (ref >59)
GFR, EST NON AFRICAN AMERICAN: 45 mL/min/{1.73_m2} — AB (ref >59)
Glucose: 134 mg/dL — ABNORMAL HIGH (ref 65–99)
Potassium: 4.7 mmol/L (ref 3.5–5.2)
Sodium: 146 mmol/L — ABNORMAL HIGH (ref 134–144)

## 2017-11-04 MED ORDER — METFORMIN HCL ER 500 MG PO TB24: ORAL_TABLET | ORAL | 0 refills | Status: DC

## 2017-11-05 ENCOUNTER — Other Ambulatory Visit: Payer: Self-pay | Admitting: Endocrinology

## 2017-11-05 ENCOUNTER — Encounter: Payer: Self-pay | Admitting: Physician Assistant

## 2017-11-09 ENCOUNTER — Telehealth (HOSPITAL_COMMUNITY): Payer: Self-pay

## 2017-11-09 NOTE — Telephone Encounter (Signed)
Called to follow up with patient in regards to Cardiac Rehab - Patient stated he is interested although he cannot afford the program. Closed referral.

## 2017-11-11 ENCOUNTER — Ambulatory Visit (INDEPENDENT_AMBULATORY_CARE_PROVIDER_SITE_OTHER): Payer: Medicare Other | Admitting: Gastroenterology

## 2017-11-11 ENCOUNTER — Encounter: Payer: Self-pay | Admitting: Gastroenterology

## 2017-11-11 VITALS — BP 116/58 | HR 60 | Ht 63.0 in | Wt 140.2 lb

## 2017-11-11 DIAGNOSIS — I25810 Atherosclerosis of coronary artery bypass graft(s) without angina pectoris: Secondary | ICD-10-CM

## 2017-11-11 DIAGNOSIS — Z86010 Personal history of colonic polyps: Secondary | ICD-10-CM | POA: Diagnosis not present

## 2017-11-11 NOTE — Patient Instructions (Addendum)
It is not a good time for colonoscopy for polyp surveillance right now since you are on aspirin and Plavix. It is not safe to hold your Plavix for at least a year from last month.  Return office visit with Dr. Ardis Hughs April 2020 to reconsider surveillance colonoscopy at that time, it will likely be safer to hold your Plavix then.  Normal BMI (Body Mass Index- based on height and weight) is between 23 and 30. Your BMI today is Body mass index is 24.84 kg/m. Marland Kitchen Please consider follow up  regarding your BMI with your Primary Care Provider.

## 2017-11-11 NOTE — Progress Notes (Signed)
Review of pertinent gastrointestinal problems: 1.  Personal history of adenomatous colon polyp.  Colonoscopy December 2013 Dr. Ardis Hughs for minor hematochezia.  He had a single subcentimeter adenoma removed.  He also had medium to large hemorrhoids which were felt to be the source of his bleeding.    HPI: This is a very pleasant 72 year old man whom I last saw several years ago the time of a colonoscopy.  See that result above.  Chief complaint is history of precancerous colon polyp  He had an N STEMI last month.  He underwent echocardiogram showing an ejection fraction of 50-55%.  He underwent repeat cardiac catheterization and underwent successful PCI to the saphenous vein grft in 2 locations.  He was put on aspirin and Plavix and advised to stay on these for 1 year.    He saw his cardiologist last week, denied any recurrent chest pain.   He is not bothered by any significant bowel changes, weight changes, no overt bleeding.   ROS: complete GI ROS as described in HPI, all other review negative.  Constitutional:  No unintentional weight loss   Past Medical History:  Diagnosis Date  . Absolute anemia 05/28/2014  . Aortic stenosis 09/17/2015  . Arthritis   . Ascites 11/18/2013  . BELLS PALSY 07/19/2010   Qualifier: Diagnosis of  By: [name], [name]    . CAD (coronary artery disease)    a. s/p CABG in 2003 b. s/p PCI to SVG-PDA in 07/2015 c. 11/2016: cath showing severe native CAD with patent LIMA-LAD and SVG-D1 with 80% stenosis of SVG-OM1-OM2. Initially medical management was recommended --> presented with recurrent angina --> s/p Synergy DES to proximal body of SVG-OM1-OM2, POBA to distal graft.   Marland Kitchen CAD- S/P PCI SVG-OM1 12/01/16 05/12/2006   Qualifier: Diagnosis of  By: [name], [name]    . Carotid bruit 07/10/2015  . Chest pain 11/22/2016  . Chronic kidney disease   . Chronic renal insufficiency, stage III (moderate) ([place]) 12/02/2016  . Constipation 05/28/2014  . COPD 07/20/2008   Qualifier: Diagnosis of  By: [name], [name]   . DEPRESSION 09/03/2006   Qualifier: Diagnosis of  By: [name], [name]    . Dermatitis 11/27/2012  . Diabetes mellitus   . Diarrhea 11/20/2013  . DM (diabetes mellitus), type 2 with ophthalmic complications ([place]) 7/40/8144   Qualifier: Diagnosis of  By: [name], [name]    . Dry eye syndrome 12/20/2010  . Epicondylitis 06/05/2013   right  . Erectile dysfunction 01/01/2012  . Essential hypertension 05/12/2006   Qualifier: Diagnosis of  By: [name], [name]    . GENITAL HERPES 05/03/2009   Qualifier: Diagnosis of  By: [name], [name]    . GERD 09/03/2006   Qualifier: Diagnosis of  By: [name], [name]    . GERD (gastroesophageal reflux disease)   . HEARING LOSS, SENSORINEURAL 05/12/2006   Qualifier: Diagnosis of  By: [name], [name]    . HEMATOCHEZIA 04/18/2008   Annotation: 9/09 during bout of constipation Qualifier: Diagnosis of  By: [name], [name]    . HIP PAIN, BILATERAL 07/17/2008   Qualifier: Diagnosis of  By: [name], [name]   . History of depression   . History of kidney stones   . HIV (human immunodeficiency virus infection) ([place]) 1991   on meds since initial dx.   Marland Kitchen HLD (hyperlipidemia) 11/19/2013  . Human immunodeficiency virus (HIV) disease ([place]) 05/12/2006   Qualifier: Diagnosis of  By: [name], [name]    .  Hx of CABG 09/03/2006   Annotation: 2003 Qualifier: Diagnosis of  By: [name], [name]    . Hyperkalemia 03/23/2014  . Hyperlipidemia   . Hypertension   . Hyponatremia 03/23/2014  . INGUINAL LYMPHADENOPATHY, RIGHT 04/03/2009   Qualifier: Diagnosis of  By: [name], [name]    . Keratoma 01/24/2015  . KNEE PAIN, BILATERAL 07/17/2008   Qualifier: Diagnosis of  By: [name], [name]   . Lesion of breast 07/21/2015  . Lipodystrophy 12/20/2010  . Memory loss 09/18/2008   Qualifier: Diagnosis of  By: [name], [name]   . Metatarsal deformity 01/24/2015  . Nasal abscess 12/04/2015  . Night sweats 07/06/2012  . Nocturia  03/06/2013  . NSTEMI (non-ST elevated myocardial infarction) ([place]) 11/29/2016  . Pain in joint, ankle and foot 01/19/2015  . Pancreatitis 11/2013   attributed to HIV meds.   . Pedal edema 05/21/2014  . PERIPHERAL VASCULAR DISEASE 07/20/2008   Qualifier: Diagnosis of  By: [name], [name]   . Posterior cervical lymphadenopathy 03/30/2014  . Protein-calorie malnutrition, severe ([place]) 03/23/2014  . SHINGLES, HX OF 05/03/2009   Annotation: R leg Qualifier: Diagnosis of  By: [name], [name]    . STEMI (ST elevation myocardial infarction) ([place]) 07/29/2015  . Unstable angina ([place]) 11/24/2016  . WEIGHT LOSS, ABNORMAL 04/03/2009   Qualifier: Diagnosis of  By: [name], [name]      Past Surgical History:  Procedure Laterality Date  . CARDIAC CATHETERIZATION N/A 07/29/2015   Procedure: Left Heart Cath and Coronary Angiography;  Surgeon: [name], MD;  Location: [facility];  Service: Cardiovascular;  Laterality: N/A;  . CARDIAC CATHETERIZATION N/A 07/29/2015   Procedure: Coronary Stent Intervention;  Surgeon: [name], MD;  Location: [facility];  Service: Cardiovascular;  Laterality: N/A;  . CORONARY ARTERY BYPASS GRAFT  05/2002  . CORONARY STENT INTERVENTION N/A 12/01/2016   Procedure: Coronary Stent Intervention;  Surgeon: [name], MD;  Location: [facility];  Service: Cardiovascular;  Laterality: N/A;  . CORONARY STENT INTERVENTION N/A 10/21/2017   Procedure: CORONARY STENT INTERVENTION;  Surgeon: Jettie Booze, MD;  Location: [facility];  Service: Cardiovascular;  Laterality: N/A;  . LEFT HEART CATH AND CORS/GRAFTS ANGIOGRAPHY N/A 11/24/2016   Procedure: Left Heart Cath and Cors/Grafts Angiography;  Surgeon: Nelva Bush, MD;  Location: [facility];  Service: Cardiovascular;  Laterality: N/A;  . LEFT HEART CATH AND CORS/GRAFTS ANGIOGRAPHY N/A 12/01/2016   Procedure: Left Heart Cath and Cors/Grafts Angiography;  Surgeon: [name], MD;  Location: [facility];  Service: Cardiovascular;  Laterality: N/A;  . LEFT HEART CATH AND CORS/GRAFTS ANGIOGRAPHY N/A 10/21/2017   Procedure: LEFT HEART CATH AND CORS/GRAFTS ANGIOGRAPHY;  Surgeon: Jettie Booze, MD;  Location: [facility];  Service: Cardiovascular;  Laterality: N/A;  . VASECTOMY      Current Outpatient Medications  Medication Sig Dispense Refill  . acetaminophen (TYLENOL) 325 MG tablet Take 2 tablets (650 mg total) by mouth every 4 (four) hours as needed for headache or mild pain.    Marland Kitchen aspirin 81 MG chewable tablet Chew 81 mg by mouth daily.    Marland Kitchen atorvastatin (LIPITOR) 20 MG tablet Take 1 tablet (20 mg total) by mouth daily. 90 tablet 3  . b complex vitamins tablet Take 1 tablet by mouth daily. 30 tablet 11  . Cholecalciferol 2000 units CAPS Take 1 capsule (2,000 Units total) by  mouth daily. 30 each 11  . clopidogrel (PLAVIX) 75 MG tablet Take 1 tablet (75 mg total) by mouth daily. 30 tablet 11  . dolutegravir (TIVICAY) 50 MG tablet Take 1 tablet (50 mg total) by mouth daily. 30 tablet 11  . doravirine (PIFELTRO) 100 MG TABS tablet Take 1 tablet (100 mg total) by mouth daily. 30 tablet 11  . fenofibrate 54 MG tablet Take 1 tablet (54 mg total) by mouth daily. 90 tablet 3  . FREESTYLE TEST STRIPS test strip USE TO CHECK BLOOD SUGAR THREE TIMES DAILY 300 each 3  . furosemide (LASIX) 20 MG tablet TAKE 2 TABLETS BY MOUTH DAILY AS NEEDED FOR FLUID RETENTION OR SWELLING (Patient taking differently: TAKE 1 TABLETS BY MOUTH DAILY AS NEEDED FOR FLUID RETENTION OR SWELLING) 90 tablet 0  . isosorbide mononitrate (IMDUR) 30 MG 24 hr tablet TAKE 1 TABLET(30 MG) BY MOUTH DAILY (Patient taking differently: 15mg  by mouth twice daily) 90 tablet 2  . Lancets (FREESTYLE) lancets Use as directed three times a day to check blood sugar.  DX E11.9 100 each 5  . lisinopril (PRINIVIL,ZESTRIL) 5 MG tablet Take 1 tablet (5 mg total) by mouth daily. 90 tablet 3  . metFORMIN  (GLUCOPHAGE-XR) 500 MG 24 hr tablet Take 500 mg by mouth daily with breakfast.    . metoprolol tartrate (LOPRESSOR) 25 MG tablet Take 0.5 tablets (12.5 mg total) by mouth 2 (two) times daily. 60 tablet 5  . neomycin-polymyxin-hydrocortisone (CORTISPORIN) otic solution INSTILL 3 DROPS IN BOTH EARS THREE TIMES DAILY (Patient taking differently: 3 drops into both ears daily as needed) 10 mL 0  . nitroGLYCERIN (NITROSTAT) 0.4 MG SL tablet Place 1 tablet (0.4 mg total) under the tongue every 5 (five) minutes as needed for chest pain. 25 tablet 3  . Omega-3 Fatty Acids (FISH OIL) 1000 MG CAPS Take 1 capsule by mouth 2 (two) times daily. Takes 2 daily     . ondansetron (ZOFRAN) 4 MG tablet Take 1 tablet (4 mg total) by mouth every 8 (eight) hours as needed for nausea or vomiting. 20 tablet 0  . pantoprazole (PROTONIX) 40 MG tablet Take 1 tablet (40 mg total) by mouth daily. 30 tablet 5  . pioglitazone (ACTOS) 15 MG tablet TAKE 1 TABLET(15 MG) BY MOUTH DAILY (Patient taking differently: 15mg  by mouth once daily) 30 tablet 0  . potassium chloride SA (K-DUR,KLOR-CON) 20 MEQ tablet TAKE 1 TABLET BY MOUTH THREE TIMES DAILY (Patient taking differently: 29meq by mouth three times daily) 270 tablet 0  . Probiotic Product (PROBIOTIC DAILY) CAPS Take 1 by mouth daily 30 capsule 11  . repaglinide (PRANDIN) 2 MG tablet TAKE 2 TABLETS BY MOUTH BEFORE BREAKFAST, 1 TABLET BY MOUTH BEFORE LUNCH AND 2 TABLETS BY MOUTH BEFORE SUPPER (Patient taking differently: 50mg  by mouth before breakfast, 25mg  before lunch, 50mg  before dinner) 450 tablet 0  . sodium bicarbonate 650 MG tablet Take 650 mg by mouth 3 (three) times daily.    Marland Kitchen triamcinolone cream (KENALOG) 0.1 % apply twice daily as needed to dry, itchy skin  2  . valACYclovir (VALTREX) 500 MG tablet TAKE 1 TABLET BY MOUTH DAILY 30 tablet 4   No current facility-administered medications for this visit.     Allergies as of 11/11/2017  . (No Known Allergies)    Family  History  Problem Relation Age of Onset  . Hyperlipidemia Mother   . Diabetes Mother        paternal grandparents/1 brother  .  Hyperlipidemia Father   . Hypertension Father        paternal grandmother/3 brothers/1 sister  . Arthritis Unknown        mother/father/paternal grandparents  . Breast cancer Maternal Aunt        paternal aunt  . Lung cancer Maternal Aunt   . Heart disease Unknown        parents/maternal grandparents/ 2 brothers  . Stroke Paternal Grandmother   . Mental retardation Sister   . Colon cancer Neg Hx   . Esophageal cancer Neg Hx   . Pancreatic cancer Neg Hx   . Stomach cancer Neg Hx   . Liver disease Neg Hx     Social History   Socioeconomic History  . Marital status: Divorced    Spouse name: Not on file  . Number of children: 4  . Years of education: Not on file  . Highest education level: Not on file  Occupational History  . 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   Social Needs  . Financial resource strain: Not on file  . Food insecurity:    Worry: Not on file    Inability: Not on file  . Transportation needs:    Medical: Not on file    Non-medical: Not on file  Tobacco Use  . Smoking status: Never Smoker  . Smokeless tobacco: Never Used  Substance and Sexual Activity  . Alcohol use: Yes    Alcohol/week: 0.0 oz    Comment: rare  . Drug use: No  . Sexual activity: Not Currently    Comment: declined condoms  Lifestyle  . Physical activity:    Days per week: Not on file    Minutes per session: Not on file  . Stress: Not on file  Relationships  . Social connections:    Talks on phone: Not on file    Gets together: Not on file    Attends religious service: Not on file    Active member of club or organization: Not on file    Attends meetings of clubs or organizations: Not on file    Relationship status: Not on file  . Intimate partner violence:    Fear of current or ex partner:  Not on file    Emotionally abused: Not on file    Physically abused: Not on file    Forced sexual activity: Not on file  Other Topics Concern  . Not on file  Social History Narrative   Lives alone.  Supportive friends and family.  His HIV Dx is not a secret.      Physical Exam: Ht 5\' 3"  (1.6 m)   Wt 140 lb 3.2 oz (63.6 kg)   BMI 24.84 kg/m  Constitutional: generally well-appearing Psychiatric: alert and oriented x3 Abdomen: soft, nontender, nondistended, no obvious ascites, no peritoneal signs, normal bowel sounds No peripheral edema noted in lower extremities  Assessment and plan: 72 y.o. male with personal history of a single subcentimeter adenoma in his colon December 2013  He had an acute MI last month and is on aspirin and Plavix.  It is very clear from his cardiologist note that he needs to stay on that Plavix for 1 year.  There is no emergency for polyp surveillance.  He has no alarm GI symptoms either.  I recommended that he return to see me in April 2020.  He might be off of Plavix completely then and at the very least  it will be a lot safer to hold it for a 5-day.  To allow for safe colonoscopy.  Please see the "Patient Instructions" section for addition details about the plan.  Owens Loffler, MD [facility] 11/11/2017, 9:05 AM

## 2017-11-17 ENCOUNTER — Encounter: Payer: Self-pay | Admitting: Adult Health

## 2017-11-17 ENCOUNTER — Ambulatory Visit (INDEPENDENT_AMBULATORY_CARE_PROVIDER_SITE_OTHER): Payer: Medicare Other | Admitting: Adult Health

## 2017-11-17 VITALS — BP 122/60 | HR 67 | Temp 98.4°F | Wt 139.0 lb

## 2017-11-17 DIAGNOSIS — I25810 Atherosclerosis of coronary artery bypass graft(s) without angina pectoris: Secondary | ICD-10-CM | POA: Diagnosis not present

## 2017-11-17 DIAGNOSIS — J302 Other seasonal allergic rhinitis: Secondary | ICD-10-CM | POA: Diagnosis not present

## 2017-11-17 MED ORDER — FLUTICASONE PROPIONATE 50 MCG/ACT NA SUSP: 2.0000 | Freq: Every day | NASAL | 6 refills | Status: DC

## 2017-11-17 MED ORDER — BENZONATATE 200 MG PO CAPS: 200.0000 mg | ORAL_CAPSULE | Freq: Two times a day (BID) | ORAL | 1 refills | Status: DC | PRN

## 2017-11-17 NOTE — Progress Notes (Signed)
Subjective:    Patient ID: MCDANIEL OHMS, male    DOB: 1945-09-10, 72 y.o.   MRN: [id number]  URI   This is a new problem. The current episode started in the past 7 days (5 days ago ). There has been no fever. Associated symptoms include congestion, coughing (Productive ), headaches, rhinorrhea, sinus pain and a sore throat. Pertinent negatives include no chest pain, ear pain, nausea, rash, swollen glands or vomiting. He has tried nothing for the symptoms.      Review of Systems  HENT: Positive for congestion, rhinorrhea, sinus pain and sore throat. Negative for ear pain.   Respiratory: Positive for cough (Productive ).   Cardiovascular: Negative for chest pain.  Gastrointestinal: Negative for nausea and vomiting.  Skin: Negative for rash.  Neurological: Positive for headaches.   Past Medical History:  Diagnosis Date  . Absolute anemia 05/28/2014  . Aortic stenosis 09/17/2015  . Arthritis   . Ascites 11/18/2013  . BELLS PALSY 07/19/2010   Qualifier: Diagnosis of  By: [name], [name]    . CAD (coronary artery disease)    a. s/p CABG in 2003 b. s/p PCI to SVG-PDA in 07/2015 c. 11/2016: cath showing severe native CAD with patent LIMA-LAD and SVG-D1 with 80% stenosis of SVG-OM1-OM2. Initially medical management was recommended --> presented with recurrent angina --> s/p Synergy DES to proximal body of SVG-OM1-OM2, POBA to distal graft.   Marland Kitchen CAD- S/P PCI SVG-OM1 12/01/16 05/12/2006   Qualifier: Diagnosis of  By: [name], [name]    . Carotid bruit 07/10/2015  . Chest pain 11/22/2016  . Chronic kidney disease   . Chronic renal insufficiency, stage III (moderate) ([place]) 12/02/2016  . Constipation 05/28/2014  . COPD 07/20/2008   Qualifier: Diagnosis of  By: [name], [name]   . DEPRESSION 09/03/2006   Qualifier: Diagnosis of  By: [name], [name]    . Dermatitis 11/27/2012  . Diabetes mellitus   . Diarrhea 11/20/2013  . DM (diabetes mellitus), type 2 with ophthalmic complications ([place])  7/37/1062   Qualifier: Diagnosis of  By: [name], [name]    . Dry eye syndrome 12/20/2010  . Epicondylitis 06/05/2013   right  . Erectile dysfunction 01/01/2012  . Essential hypertension 05/12/2006   Qualifier: Diagnosis of  By: [name], [name]    . GENITAL HERPES 05/03/2009   Qualifier: Diagnosis of  By: [name], [name]    . GERD 09/03/2006   Qualifier: Diagnosis of  By: [name], [name]    . GERD (gastroesophageal reflux disease)   . HEARING LOSS, SENSORINEURAL 05/12/2006   Qualifier: Diagnosis of  By: [name], [name]    . HEMATOCHEZIA 04/18/2008   Annotation: 9/09 during bout of constipation Qualifier: Diagnosis of  By: [name], [name]    . HIP PAIN, BILATERAL 07/17/2008   Qualifier: Diagnosis of  By: [name], [name]   . History of depression   . History of kidney stones   . HIV (human immunodeficiency virus infection) ([place]) 1991   on meds since initial dx.   Marland Kitchen HLD (hyperlipidemia) 11/19/2013  . Human immunodeficiency virus (HIV) disease ([place]) 05/12/2006   Qualifier: Diagnosis of  By: [name], [name]    . Hx of CABG 09/03/2006   Annotation: 2003 Qualifier: Diagnosis of  By: [name], [name]    . Hyperkalemia 03/23/2014  . Hyperlipidemia   . Hypertension   . Hyponatremia 03/23/2014  . INGUINAL LYMPHADENOPATHY, RIGHT 04/03/2009   Qualifier: Diagnosis  of  By: [name], Jenny Reichmann    . Keratoma 01/24/2015  . KNEE PAIN, BILATERAL 07/17/2008   Qualifier: Diagnosis of  By: [name], [name]   . Lesion of breast 07/21/2015  . Lipodystrophy 12/20/2010  . Memory loss 09/18/2008   Qualifier: Diagnosis of  By: [name], [name]   . Metatarsal deformity 01/24/2015  . Nasal abscess 12/04/2015  . Night sweats 07/06/2012  . Nocturia 03/06/2013  . NSTEMI (non-ST elevated myocardial infarction) ([place]) 11/29/2016  . Pain in joint, ankle and foot 01/19/2015  . Pancreatitis 11/2013   attributed to HIV meds.   . Pedal edema 05/21/2014  . PERIPHERAL VASCULAR DISEASE 07/20/2008   Qualifier: Diagnosis of   By: [name], [name]   . Posterior cervical lymphadenopathy 03/30/2014  . Protein-calorie malnutrition, severe ([place]) 03/23/2014  . SHINGLES, HX OF 05/03/2009   Annotation: R leg Qualifier: Diagnosis of  By: [name], [name]    . STEMI (ST elevation myocardial infarction) ([place]) 07/29/2015  . Unstable angina ([place]) 11/24/2016  . WEIGHT LOSS, ABNORMAL 04/03/2009   Qualifier: Diagnosis of  By: [name], [name]      Social History   Socioeconomic History  . Marital status: Divorced    Spouse name: Not on file  . Number of children: 4  . Years of education: Not on file  . Highest education level: Not on file  Occupational History  . 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   Social Needs  . Financial resource strain: Not on file  . Food insecurity:    Worry: Not on file    Inability: Not on file  . Transportation needs:    Medical: Not on file    Non-medical: Not on file  Tobacco Use  . Smoking status: Never Smoker  . Smokeless tobacco: Never Used  Substance and Sexual Activity  . Alcohol use: Yes    Alcohol/week: 0.0 oz    Comment: rare  . Drug use: No  . Sexual activity: Not Currently    Comment: declined condoms  Lifestyle  . Physical activity:    Days per week: Not on file    Minutes per session: Not on file  . Stress: Not on file  Relationships  . Social connections:    Talks on phone: Not on file    Gets together: Not on file    Attends religious service: Not on file    Active member of club or organization: Not on file    Attends meetings of clubs or organizations: Not on file    Relationship status: Not on file  . Intimate partner violence:    Fear of current or ex partner: Not on file    Emotionally abused: Not on file    Physically abused: Not on file    Forced sexual activity: Not on file  Other Topics Concern  . Not on file  Social History Narrative   Lives alone.  Supportive friends and family.   His HIV Dx is not a secret.     Past Surgical History:  Procedure Laterality Date  . CARDIAC CATHETERIZATION N/A 07/29/2015   Procedure: Left Heart Cath and Coronary Angiography;  Surgeon: [name], MD;  Location: [facility];  Service: Cardiovascular;  Laterality: N/A;  . CARDIAC CATHETERIZATION N/A 07/29/2015   Procedure: Coronary Stent Intervention;  Surgeon: [name], MD;  Location: [facility];  Service: Cardiovascular;  Laterality: N/A;  . CORONARY ARTERY BYPASS GRAFT  05/2002  . CORONARY STENT INTERVENTION N/A 12/01/2016   Procedure: Coronary Stent Intervention;  Surgeon: [name], MD;  Location: [facility];  Service: Cardiovascular;  Laterality: N/A;  . CORONARY STENT INTERVENTION N/A 10/21/2017   Procedure: CORONARY STENT INTERVENTION;  Surgeon: Jettie Booze, MD;  Location: [facility];  Service: Cardiovascular;  Laterality: N/A;  . LEFT HEART CATH AND CORS/GRAFTS ANGIOGRAPHY N/A 11/24/2016   Procedure: Left Heart Cath and Cors/Grafts Angiography;  Surgeon: Nelva Bush, MD;  Location: [facility];  Service: Cardiovascular;  Laterality: N/A;  . LEFT HEART CATH AND CORS/GRAFTS ANGIOGRAPHY N/A 12/01/2016   Procedure: Left Heart Cath and Cors/Grafts Angiography;  Surgeon: [name], MD;  Location: [facility];  Service: Cardiovascular;  Laterality: N/A;  . LEFT HEART CATH AND CORS/GRAFTS ANGIOGRAPHY N/A 10/21/2017   Procedure: LEFT HEART CATH AND CORS/GRAFTS ANGIOGRAPHY;  Surgeon: Jettie Booze, MD;  Location: [facility];  Service: Cardiovascular;  Laterality: N/A;  . VASECTOMY      Family History  Problem Relation Age of Onset  . Hyperlipidemia Mother   . Diabetes Mother        paternal grandparents/1 brother  . Hyperlipidemia Father   . Hypertension Father        paternal grandmother/3 brothers/1 sister  . Arthritis Unknown        mother/father/paternal grandparents  . Breast cancer  Maternal Aunt        paternal aunt  . Lung cancer Maternal Aunt   . Heart disease Unknown        parents/maternal grandparents/ 2 brothers  . Stroke Paternal Grandmother   . Mental retardation Sister   . Colon cancer Neg Hx   . Esophageal cancer Neg Hx   . Pancreatic cancer Neg Hx   . Stomach cancer Neg Hx   . Liver disease Neg Hx     No Known Allergies  Current Outpatient Medications on File Prior to Visit  Medication Sig Dispense Refill  . acetaminophen (TYLENOL) 325 MG tablet Take 2 tablets (650 mg total) by mouth every 4 (four) hours as needed for headache or mild pain.    Marland Kitchen aspirin 81 MG chewable tablet Chew 81 mg by mouth daily.    Marland Kitchen atorvastatin (LIPITOR) 20 MG tablet Take 1 tablet (20 mg total) by mouth daily. 90 tablet 3  . b complex vitamins tablet Take 1 tablet by mouth daily. 30 tablet 11  . Cholecalciferol 2000 units CAPS Take 1 capsule (2,000 Units total) by mouth daily. 30 each 11  . clopidogrel (PLAVIX) 75 MG tablet Take 1 tablet (75 mg total) by mouth daily. 30 tablet 11  . dolutegravir (TIVICAY) 50 MG tablet Take 1 tablet (50 mg total) by mouth daily. 30 tablet 11  . doravirine (PIFELTRO) 100 MG TABS tablet Take 1 tablet (100 mg total) by mouth daily. 30 tablet 11  . fenofibrate 54 MG tablet Take 1 tablet (54 mg total) by mouth daily. 90 tablet 3  . FREESTYLE TEST STRIPS test strip USE TO CHECK BLOOD SUGAR THREE TIMES DAILY 300 each 3  . furosemide (LASIX) 20 MG tablet TAKE 2 TABLETS BY MOUTH DAILY AS NEEDED FOR FLUID RETENTION OR SWELLING (Patient taking differently: TAKE 1 TABLETS BY MOUTH DAILY AS NEEDED FOR FLUID RETENTION OR SWELLING) 90 tablet 0  . isosorbide mononitrate (IMDUR) 30 MG 24 hr tablet TAKE 1 TABLET(30 MG) BY MOUTH  DAILY (Patient taking differently: 15mg  by mouth twice daily) 90 tablet 2  . Lancets (FREESTYLE) lancets Use as directed three times a day to check blood sugar.  DX E11.9 100 each 5  . lisinopril (PRINIVIL,ZESTRIL) 5 MG tablet Take 1  tablet (5 mg total) by mouth daily. 90 tablet 3  . metFORMIN (GLUCOPHAGE-XR) 500 MG 24 hr tablet Take 500 mg by mouth daily with breakfast.    . metoprolol tartrate (LOPRESSOR) 25 MG tablet Take 0.5 tablets (12.5 mg total) by mouth 2 (two) times daily. 60 tablet 5  . neomycin-polymyxin-hydrocortisone (CORTISPORIN) otic solution INSTILL 3 DROPS IN BOTH EARS THREE TIMES DAILY (Patient taking differently: 3 drops into both ears daily as needed) 10 mL 0  . nitroGLYCERIN (NITROSTAT) 0.4 MG SL tablet Place 1 tablet (0.4 mg total) under the tongue every 5 (five) minutes as needed for chest pain. 25 tablet 3  . Omega-3 Fatty Acids (FISH OIL) 1000 MG CAPS Take 1 capsule by mouth 2 (two) times daily. Takes 2 daily     . ondansetron (ZOFRAN) 4 MG tablet Take 1 tablet (4 mg total) by mouth every 8 (eight) hours as needed for nausea or vomiting. 20 tablet 0  . pantoprazole (PROTONIX) 40 MG tablet Take 1 tablet (40 mg total) by mouth daily. 30 tablet 5  . pioglitazone (ACTOS) 15 MG tablet TAKE 1 TABLET(15 MG) BY MOUTH DAILY (Patient taking differently: 15mg  by mouth once daily) 30 tablet 0  . potassium chloride SA (K-DUR,KLOR-CON) 20 MEQ tablet TAKE 1 TABLET BY MOUTH THREE TIMES DAILY (Patient taking differently: 38meq by mouth three times daily) 270 tablet 0  . Probiotic Product (PROBIOTIC DAILY) CAPS Take 1 by mouth daily 30 capsule 11  . repaglinide (PRANDIN) 2 MG tablet TAKE 2 TABLETS BY MOUTH BEFORE BREAKFAST, 1 TABLET BY MOUTH BEFORE LUNCH AND 2 TABLETS BY MOUTH BEFORE SUPPER (Patient taking differently: 50mg  by mouth before breakfast, 25mg  before lunch, 50mg  before dinner) 450 tablet 0  . sodium bicarbonate 650 MG tablet Take 650 mg by mouth 3 (three) times daily.    Marland Kitchen triamcinolone cream (KENALOG) 0.1 % apply twice daily as needed to dry, itchy skin  2  . valACYclovir (VALTREX) 500 MG tablet TAKE 1 TABLET BY MOUTH DAILY 30 tablet 4   No current facility-administered medications on file prior to visit.      BP 122/60   Pulse 67   Temp 98.4 F (36.9 C)   Wt 139 lb (63 kg)   SpO2 98%   BMI 24.62 kg/m       Objective:   Physical Exam  Constitutional: He is oriented to person, place, and time. He appears well-developed and well-nourished. No distress.  HENT:  Head: Normocephalic and atraumatic.  Right Ear: Hearing, tympanic membrane, external ear and ear canal normal.  Left Ear: Hearing, tympanic membrane and external ear normal.  Nose: Rhinorrhea present. No mucosal edema. Right sinus exhibits maxillary sinus tenderness. Right sinus exhibits no frontal sinus tenderness. Left sinus exhibits no frontal sinus tenderness.  Mouth/Throat: Oropharynx is clear and moist. No oropharyngeal exudate.  + clear PND   Eyes: Pupils are equal, round, and reactive to light. Conjunctivae and EOM are normal. Right eye exhibits no discharge. Left eye exhibits no discharge. No scleral icterus.  Neck: Normal range of motion. Neck supple. No thyromegaly present.  Cardiovascular: Normal rate, regular rhythm, normal heart sounds and intact distal pulses. Exam reveals no gallop.  No murmur heard. Pulmonary/Chest: Effort normal and breath  sounds normal. No respiratory distress. He has no wheezes. He has no rales. He exhibits no tenderness.  Lymphadenopathy:    He has no cervical adenopathy.  Neurological: He is alert and oriented to person, place, and time.  Skin: Skin is warm and dry. No rash noted. He is not diaphoretic. No erythema. No pallor.  Psychiatric: He has a normal mood and affect. His behavior is normal. Judgment and thought content normal.  Nursing note and vitals reviewed.     Assessment & Plan:  1. Seasonal allergies - Exam consistent with seasonal allergies. Follow up if no improvement in the next 4-5 days  - fluticasone (FLONASE) 50 MCG/ACT nasal spray; Place 2 sprays into both nostrils daily.  Dispense: 16 g; Refill: 6 - benzonatate (TESSALON) 200 MG capsule; Take 1 capsule (200 mg total)  by mouth 2 (two) times daily as needed for cough.  Dispense: 20 capsule; Refill: 1   Dorothyann Peng, NP

## 2017-11-18 ENCOUNTER — Other Ambulatory Visit: Payer: Self-pay | Admitting: Internal Medicine

## 2017-11-18 DIAGNOSIS — B029 Zoster without complications: Secondary | ICD-10-CM

## 2017-11-21 ENCOUNTER — Other Ambulatory Visit: Payer: Self-pay | Admitting: Student

## 2017-11-22 ENCOUNTER — Encounter: Payer: Self-pay | Admitting: Adult Health

## 2017-11-23 MED ORDER — DOXYCYCLINE HYCLATE 100 MG PO TABS: 100.0000 mg | ORAL_TABLET | Freq: Two times a day (BID) | ORAL | 0 refills | Status: DC

## 2017-12-04 ENCOUNTER — Other Ambulatory Visit: Payer: Self-pay | Admitting: Endocrinology

## 2017-12-04 DIAGNOSIS — E1165 Type 2 diabetes mellitus with hyperglycemia: Secondary | ICD-10-CM

## 2017-12-18 ENCOUNTER — Other Ambulatory Visit (INDEPENDENT_AMBULATORY_CARE_PROVIDER_SITE_OTHER): Payer: Medicare Other

## 2017-12-18 DIAGNOSIS — E1165 Type 2 diabetes mellitus with hyperglycemia: Secondary | ICD-10-CM | POA: Diagnosis not present

## 2017-12-18 LAB — COMPREHENSIVE METABOLIC PANEL
ALT: 25 U/L (ref 0–53)
AST: 19 U/L (ref 0–37)
Albumin: 3.7 g/dL (ref 3.5–5.2)
Alkaline Phosphatase: 45 U/L (ref 39–117)
BUN: 25 mg/dL — AB (ref 6–23)
CHLORIDE: 109 meq/L (ref 96–112)
CO2: 23 meq/L (ref 19–32)
Calcium: 8.6 mg/dL (ref 8.4–10.5)
Creatinine, Ser: 1.6 mg/dL — ABNORMAL HIGH (ref 0.40–1.50)
GFR: 45.7 mL/min — AB (ref >60.00)
GLUCOSE: 109 mg/dL — AB (ref 70–99)
POTASSIUM: 4.6 meq/L (ref 3.5–5.1)
SODIUM: 139 meq/L (ref 135–145)
Total Bilirubin: 0.3 mg/dL (ref 0.2–1.2)
Total Protein: 6.4 g/dL (ref 6.0–8.3)

## 2017-12-19 LAB — FRUCTOSAMINE: Fructosamine: 245 umol/L (ref 0–285)

## 2017-12-21 ENCOUNTER — Other Ambulatory Visit: Payer: Self-pay | Admitting: *Deleted

## 2017-12-21 MED ORDER — PANTOPRAZOLE SODIUM 40 MG PO TBEC: 40.0000 mg | DELAYED_RELEASE_TABLET | Freq: Every day | ORAL | 5 refills | Status: DC

## 2017-12-21 NOTE — Telephone Encounter (Signed)
Rx has been sent to the pharmacy electronically. ° °

## 2017-12-22 ENCOUNTER — Ambulatory Visit (INDEPENDENT_AMBULATORY_CARE_PROVIDER_SITE_OTHER): Payer: Medicare Other | Admitting: Endocrinology

## 2017-12-22 ENCOUNTER — Encounter: Payer: Self-pay | Admitting: Endocrinology

## 2017-12-22 ENCOUNTER — Other Ambulatory Visit: Payer: Self-pay

## 2017-12-22 VITALS — BP 126/74 | HR 61 | Ht 63.0 in | Wt 141.4 lb

## 2017-12-22 DIAGNOSIS — E782 Mixed hyperlipidemia: Secondary | ICD-10-CM | POA: Diagnosis not present

## 2017-12-22 DIAGNOSIS — N289 Disorder of kidney and ureter, unspecified: Secondary | ICD-10-CM | POA: Diagnosis not present

## 2017-12-22 DIAGNOSIS — I25810 Atherosclerosis of coronary artery bypass graft(s) without angina pectoris: Secondary | ICD-10-CM

## 2017-12-22 DIAGNOSIS — E1165 Type 2 diabetes mellitus with hyperglycemia: Secondary | ICD-10-CM | POA: Diagnosis not present

## 2017-12-22 MED ORDER — ICOSAPENT ETHYL 1 G PO CAPS: 2.0000 | ORAL_CAPSULE | Freq: Two times a day (BID) | ORAL | 2 refills | Status: DC

## 2017-12-22 NOTE — Patient Instructions (Signed)
Check blood sugars on waking up  2-3/7  Also check blood sugars about 2 hours after a meal and do this after different meals by rotation  Recommended blood sugar levels on waking up is 90-130 and about 2 hours after meal is 130-160  Please bring your blood sugar monitor to each visit, thank you  Repaglanide 1 pill at supper

## 2017-12-22 NOTE — Progress Notes (Signed)
Patient ID: [name], male   DOB: 16-Jan-1946, 72 y.o.   MRN: [id number]           Reason for Appointment: Follow-up for Type 2 Diabetes  Referring physician: Charlett Blake  History of Present Illness:          Date of diagnosis of type 2 diabetes mellitus: 2013        Background history:  He only mildly increased blood sugar levels at the time of diagnosis and A1c was 7.1  He had been treated initially with metformin but this was stopped subsequently when his renal function was worse  He was subsequently treated with glipizide but his blood sugars had been poorly controlled in 2016 with glipizide alone Since his A1c has been persistently over 9% he was started on Januvia 50 mg in 01/2015 At initial consultation he had a high A1c of 9.5; he was switched from glipizide to Prandin in 02/2015 Previously when he had high fasting readings he was started on Toujeo on 06/11/16, however this was stopped in April 2018 when he was getting hypoglycemia  Recent history:    Oral hypoglycemic drugs: Prandin 4 mg at breakfast  and dinner and 2 mg before lunch , Actos 15 mg daily, metformin ER 500 mg daily   His A1c is most recently 7.1, previous range 6.3-7.6  He has not been seen in follow-up since 04/2017  Current blood sugar patterns and problems identified:  He did not check his blood sugars over the last 2 weeks or so and only once recently  He says that he started having weak spells and not low sugars at night about 3 weeks ago and this occurred on 3 nights consecutively  He does not know why this happened and does not remember whether he had changed his diet  However he has not had any low sugar symptoms since then  He is checking blood sugars mostly in the mornings otherwise and rarely around lunchtime  Recent fasting lab glucose was 109  He does take his Prandin before each meal and is fairly compliant with this  No side effects from Actos and is still on low-dose metformin, a dose  reduction because of renal insufficiency  Although he had an MI in March he is now able to start walking regularly  His weight is about the same recently    Side effects from medications have been: None  Compliance with the medical regimen: Good, usually trying to take his Prandin before eating   Glucose monitoring:  done usually 0-1  times a day         Glucometer:  FreeStyle     Blood Glucose readings by download:  PRE-MEAL Fasting Lunch Dinner Bedtime Overall  Glucose range:  52-141    44, 56   Mean/median:        POST-MEAL PC Breakfast PC Lunch PC Dinner  Glucose range:  200 ? ?  Mean/median:       Dietician visit, most recent: 8/15 DIET:  has been trying to reduce fried/usually not eating high fat  foods; eating oatmeal in the morning With yogurt               Dinner usually at 6 pm Exercise:  walking recently 2 miles, 5/7 days  .  Weight history:   Wt Readings from Last 3 Encounters:  12/22/17 141 lb 6.4 oz (64.1 kg)  11/17/17 139 lb (63 kg)  11/11/17 140 lb 3.2 oz (63.6 kg)  Glycemic control:   Lab Results  Component Value Date   HGBA1C 7.1 (H) 10/15/2017   HGBA1C 5.8 07/10/2017   HGBA1C 6.3 04/08/2017   Lab Results  Component Value Date   MICROALBUR 3.4 (H) 07/10/2017   LDLCALC 33 10/21/2017   CREATININE 1.60 (H) 12/18/2017    Other active problems: See review of systems   Lab on 12/18/2017  Component Date Value Ref Range Status  . Fructosamine 12/18/2017 245  0 - 285 umol/L Final   Comment: Published reference interval for apparently healthy subjects between age 27 and 57 is 81 - 285 umol/L and in a poorly controlled diabetic population is 228 - 563 umol/L with a mean of 396 umol/L.   Marland Kitchen Sodium 12/18/2017 139  135 - 145 mEq/L Final  . Potassium 12/18/2017 4.6  3.5 - 5.1 mEq/L Final  . Chloride 12/18/2017 109  96 - 112 mEq/L Final  . CO2 12/18/2017 23  19 - 32 mEq/L Final  . Glucose, Bld 12/18/2017 109* 70 - 99 mg/dL Final  . BUN  12/18/2017 25* 6 - 23 mg/dL Final  . Creatinine, Ser 12/18/2017 1.60* 0.40 - 1.50 mg/dL Final  . Total Bilirubin 12/18/2017 0.3  0.2 - 1.2 mg/dL Final  . Alkaline Phosphatase 12/18/2017 45  39 - 117 U/L Final  . AST 12/18/2017 19  0 - 37 U/L Final  . ALT 12/18/2017 25  0 - 53 U/L Final  . Total Protein 12/18/2017 6.4  6.0 - 8.3 g/dL Final  . Albumin 12/18/2017 3.7  3.5 - 5.2 g/dL Final  . Calcium 12/18/2017 8.6  8.4 - 10.5 mg/dL Final  . GFR 12/18/2017 45.70* >60.00 mL/min Final       Allergies as of 12/22/2017   No Known Allergies     Medication List        Accurate as of 12/22/17  8:37 PM. Always use your most recent med list.          acetaminophen 325 MG tablet Commonly known as:  TYLENOL Take 2 tablets (650 mg total) by mouth every 4 (four) hours as needed for headache or mild pain.   aspirin 81 MG chewable tablet Chew 81 mg by mouth daily.   atorvastatin 20 MG tablet Commonly known as:  LIPITOR Take 1 tablet (20 mg total) by mouth daily.   b complex vitamins tablet Take 1 tablet by mouth daily.   benzonatate 200 MG capsule Commonly known as:  TESSALON Take 1 capsule (200 mg total) by mouth 2 (two) times daily as needed for cough.   Cholecalciferol 2000 units Caps Take 1 capsule (2,000 Units total) by mouth daily.   clopidogrel 75 MG tablet Commonly known as:  PLAVIX Take 1 tablet (75 mg total) by mouth daily.   dolutegravir 50 MG tablet Commonly known as:  TIVICAY Take 1 tablet (50 mg total) by mouth daily.   doravirine 100 MG Tabs tablet Commonly known as:  PIFELTRO Take 1 tablet (100 mg total) by mouth daily.   fenofibrate 54 MG tablet Take 1 tablet (54 mg total) by mouth daily.   Fish Oil 1000 MG Caps Take 1 capsule by mouth 2 (two) times daily. Takes 2 daily   fluticasone 50 MCG/ACT nasal spray Commonly known as:  FLONASE Place 2 sprays into both nostrils daily.   freestyle lancets Use as directed three times a day to check blood sugar.   DX E11.9   FREESTYLE TEST STRIPS test strip Generic drug:  glucose blood USE  TO CHECK BLOOD SUGAR THREE TIMES DAILY   furosemide 20 MG tablet Commonly known as:  LASIX TAKE 2 TABLETS BY MOUTH DAILY AS NEEDED FOR FLUID RETENTION OR SWELLING   Icosapent Ethyl 1 g Caps Commonly known as:  VASCEPA Take 2 capsules (2 g total) by mouth 2 (two) times daily.   isosorbide mononitrate 30 MG 24 hr tablet Commonly known as:  IMDUR TAKE 1 TABLET(30 MG) BY MOUTH DAILY   lisinopril 5 MG tablet Commonly known as:  PRINIVIL,ZESTRIL Take 1 tablet (5 mg total) by mouth daily.   metFORMIN 500 MG 24 hr tablet Commonly known as:  GLUCOPHAGE-XR Take 500 mg by mouth daily with breakfast.   metoprolol tartrate 25 MG tablet Commonly known as:  LOPRESSOR TAKE 1/2 TABLET BY MOUTH TWICE DAILY   neomycin-polymyxin-hydrocortisone OTIC solution Commonly known as:  CORTISPORIN INSTILL 3 DROPS IN BOTH EARS THREE TIMES DAILY   nitroGLYCERIN 0.4 MG SL tablet Commonly known as:  NITROSTAT Place 1 tablet (0.4 mg total) under the tongue every 5 (five) minutes as needed for chest pain.   ondansetron 4 MG tablet Commonly known as:  ZOFRAN Take 1 tablet (4 mg total) by mouth every 8 (eight) hours as needed for nausea or vomiting.   pantoprazole 40 MG tablet Commonly known as:  PROTONIX Take 1 tablet (40 mg total) by mouth daily.   pioglitazone 15 MG tablet Commonly known as:  ACTOS TAKE 1 TABLET(15 MG) BY MOUTH DAILY   potassium chloride SA 20 MEQ tablet Commonly known as:  K-DUR,KLOR-CON TAKE 1 TABLET BY MOUTH THREE TIMES DAILY   PROBIOTIC DAILY Caps Take 1 by mouth daily   repaglinide 2 MG tablet Commonly known as:  PRANDIN TAKE 2 TABLETS BY MOUTH BEFORE BREAKFAST, 1 TABLET BY MOUTH BEFORE LUNCH AND 2 TABLETS BY MOUTH BEFORE SUPPER   sodium bicarbonate 650 MG tablet Take 650 mg by mouth 3 (three) times daily.   triamcinolone cream 0.1 % Commonly known as:  KENALOG apply twice daily as  needed to dry, itchy skin   valACYclovir 500 MG tablet Commonly known as:  VALTREX TAKE 1 TABLET BY MOUTH DAILY       Allergies: No Known Allergies  Past Medical History:  Diagnosis Date  . Absolute anemia 05/28/2014  . Aortic stenosis 09/17/2015  . Arthritis   . Ascites 11/18/2013  . BELLS PALSY 07/19/2010   Qualifier: Diagnosis of  By: [name], [name]    . CAD (coronary artery disease)    a. s/p CABG in 2003 b. s/p PCI to SVG-PDA in 07/2015 c. 11/2016: cath showing severe native CAD with patent LIMA-LAD and SVG-D1 with 80% stenosis of SVG-OM1-OM2. Initially medical management was recommended --> presented with recurrent angina --> s/p Synergy DES to proximal body of SVG-OM1-OM2, POBA to distal graft.   Marland Kitchen CAD- S/P PCI SVG-OM1 12/01/16 05/12/2006   Qualifier: Diagnosis of  By: [name], [name]    . Carotid bruit 07/10/2015  . Chest pain 11/22/2016  . Chronic kidney disease   . Chronic renal insufficiency, stage III (moderate) ([place]) 12/02/2016  . Constipation 05/28/2014  . COPD 07/20/2008   Qualifier: Diagnosis of  By: [name], [name]   . DEPRESSION 09/03/2006   Qualifier: Diagnosis of  By: [name], [name]    . Dermatitis 11/27/2012  . Diabetes mellitus   . Diarrhea 11/20/2013  . DM (diabetes mellitus), type 2 with ophthalmic complications ([place]) 02/02/7792   Qualifier: Diagnosis of  By: [name], [name]    .  Dry eye syndrome 12/20/2010  . Epicondylitis 06/05/2013   right  . Erectile dysfunction 01/01/2012  . Essential hypertension 05/12/2006   Qualifier: Diagnosis of  By: [name], [name]    . GENITAL HERPES 05/03/2009   Qualifier: Diagnosis of  By: [name], [name]    . GERD 09/03/2006   Qualifier: Diagnosis of  By: [name], [name]    . GERD (gastroesophageal reflux disease)   . HEARING LOSS, SENSORINEURAL 05/12/2006   Qualifier: Diagnosis of  By: [name], [name]    . HEMATOCHEZIA 04/18/2008   Annotation: 9/09 during bout of constipation Qualifier: Diagnosis of  By:  [name], [name]    . HIP PAIN, BILATERAL 07/17/2008   Qualifier: Diagnosis of  By: [name], [name]   . History of depression   . History of kidney stones   . HIV (human immunodeficiency virus infection) ([place]) 1991   on meds since initial dx.   Marland Kitchen HLD (hyperlipidemia) 11/19/2013  . Human immunodeficiency virus (HIV) disease ([place]) 05/12/2006   Qualifier: Diagnosis of  By: [name], [name]    . Hx of CABG 09/03/2006   Annotation: 2003 Qualifier: Diagnosis of  By: [name], [name]    . Hyperkalemia 03/23/2014  . Hyperlipidemia   . Hypertension   . Hyponatremia 03/23/2014  . INGUINAL LYMPHADENOPATHY, RIGHT 04/03/2009   Qualifier: Diagnosis of  By: [name], [name]    . Keratoma 01/24/2015  . KNEE PAIN, BILATERAL 07/17/2008   Qualifier: Diagnosis of  By: [name], [name]   . Lesion of breast 07/21/2015  . Lipodystrophy 12/20/2010  . Memory loss 09/18/2008   Qualifier: Diagnosis of  By: [name], [name]   . Metatarsal deformity 01/24/2015  . Nasal abscess 12/04/2015  . Night sweats 07/06/2012  . Nocturia 03/06/2013  . NSTEMI (non-ST elevated myocardial infarction) ([place]) 11/29/2016  . Pain in joint, ankle and foot 01/19/2015  . Pancreatitis 11/2013   attributed to HIV meds.   . Pedal edema 05/21/2014  . PERIPHERAL VASCULAR DISEASE 07/20/2008   Qualifier: Diagnosis of  By: [name], [name]   . Posterior cervical lymphadenopathy 03/30/2014  . Protein-calorie malnutrition, severe ([place]) 03/23/2014  . SHINGLES, HX OF 05/03/2009   Annotation: R leg Qualifier: Diagnosis of  By: [name], [name]    . STEMI (ST elevation myocardial infarction) ([place]) 07/29/2015  . Unstable angina ([place]) 11/24/2016  . WEIGHT LOSS, ABNORMAL 04/03/2009   Qualifier: Diagnosis of  By: [name], [name]      Past Surgical History:  Procedure Laterality Date  . CARDIAC CATHETERIZATION N/A 07/29/2015   Procedure: Left Heart Cath and Coronary Angiography;  Surgeon: [name], MD;  Location: [place] [facility];  Service:  Cardiovascular;  Laterality: N/A;  . CARDIAC CATHETERIZATION N/A 07/29/2015   Procedure: Coronary Stent Intervention;  Surgeon: [name], MD;  Location: [place] [facility];  Service: Cardiovascular;  Laterality: N/A;  . CORONARY ARTERY BYPASS GRAFT  05/2002  . CORONARY STENT INTERVENTION N/A 12/01/2016   Procedure: Coronary Stent Intervention;  Surgeon: [name], MD;  Location: [place] [facility];  Service: Cardiovascular;  Laterality: N/A;  . CORONARY STENT INTERVENTION N/A 10/21/2017   Procedure: CORONARY STENT INTERVENTION;  Surgeon: Jettie Booze, MD;  Location: [place] [facility];  Service: Cardiovascular;  Laterality: N/A;  . LEFT HEART CATH AND CORS/GRAFTS ANGIOGRAPHY N/A 11/24/2016   Procedure: Left Heart Cath and Cors/Grafts Angiography;  Surgeon: Nelva Bush, MD;  Location[facility]  INVASIVE [facility];  Service: Cardiovascular;  Laterality: N/A;  . LEFT HEART CATH AND CORS/GRAFTS ANGIOGRAPHY N/A 12/01/2016   Procedure: Left Heart Cath and Cors/Grafts Angiography;  Surgeon: [name], MD;  Location: [place] [facility];  Service: Cardiovascular;  Laterality: N/A;  . LEFT HEART CATH AND CORS/GRAFTS ANGIOGRAPHY N/A 10/21/2017   Procedure: LEFT HEART CATH AND CORS/GRAFTS ANGIOGRAPHY;  Surgeon: Jettie Booze, MD;  Location: [place] [facility];  Service: Cardiovascular;  Laterality: N/A;  . VASECTOMY      Family History  Problem Relation Age of Onset  . Hyperlipidemia Mother   . Diabetes Mother        paternal grandparents/1 brother  . Hyperlipidemia Father   . Hypertension Father        paternal grandmother/3 brothers/1 sister  . Arthritis Unknown        mother/father/paternal grandparents  . Breast cancer Maternal Aunt        paternal aunt  . Lung cancer Maternal Aunt   . Heart disease Unknown        parents/maternal grandparents/ 2 brothers  . Stroke Paternal Grandmother   . Mental retardation Sister   . Colon cancer Neg Hx   . Esophageal cancer Neg  Hx   . Pancreatic cancer Neg Hx   . Stomach cancer Neg Hx   . Liver disease Neg Hx     Social History:  reports that he has never smoked. He has never used smokeless tobacco. He reports that he drinks alcohol. He reports that he does not use drugs.    Review of Systems    Lipid history: On Lipitor 40 mg, followed by PCP His triglycerides are much higher in March  Also has significant history of CAD   Lab Results  Component Value Date   CHOL 140 10/21/2017   HDL 28 (L) 10/21/2017   LDLCALC 33 10/21/2017   LDLDIRECT 72.0 11/04/2016   TRIG 397 (H) 10/21/2017   CHOLHDL 5.0 10/21/2017            RENAL dysfunction: Followed by nephrologist,This is mild  and creatinine somewhat variable  Lab Results  Component Value Date   CREATININE 1.60 (H) 12/18/2017   CREATININE 1.54 (H) 11/03/2017   CREATININE 1.43 (H) 10/26/2017      Hypertension: Mild, Recently taking 5 mg lisinopril And 25 mg metoprolol  Followed by cardiologist  BP Readings from Last 3 Encounters:  12/22/17 126/74  11/17/17 122/60  11/11/17 (!) 116/58    Diabetic foot exam  in 09/2016 shows normal monofilament sensation in the toes and plantar surfaces, no skin lesions or ulcers on the feet and absent pedal pulses    Physical Examination:  BP 126/74 (BP Location: Left Arm, Patient Position: Sitting, Cuff Size: Normal)   Pulse 61   Ht 5\' 3"  (1.6 m)   Wt 141 lb 6.4 oz (64.1 kg)   SpO2 98%   BMI 25.05 kg/m   No ankle edema  ASSESSMENT:  Diabetes type 2, uncontrolled with BMI 25; has  abdominal obesity   See history of present illness for detailed discussion of current diabetes management, blood sugar patterns and problems identified  He has not been seen in follow-up regularly  His A1c is unusually high at 7.1 on the last measurement but fructosamine is relatively good at 245 now  He is on relatively high doses of Prandin and not clear if he may have been noncompliant with this when his A1c was  higher Also only  recently starting to do regular walking which is helping Also continues to take Actos without side effects, no history of recent CHF  Metformin dose is limited by his renal dysfunction  Mild hypertension: Well controlled, on low-dose fenofibrate  HIGH triglycerides: Much worse as of 3/19 and not clear why This is despite taking fish oil OTC and a statin drug LDL below 70  RENAL insufficiency: Recently is somewhat worse and needs to follow-up with nephrologist No recurrence of hyperkalemia   PLAN:  Emphasized the need to start checking blood sugar regularly to help assess his management Does need to check readings after supper also Since he had hypoglycemia at bedtime he can reduce his Prandin down to 1 tablet at suppertime  No change in Actos or metformin for insulin resistance Since he has no edema or LV dysfunction he can continue Actos  CAD, risk reduction and dyslipidemia: He is a good candidate for adding Vascepa to his statin drug given the recent studies and this was discussed in detail He will switch his OTC fish oil to Vascepa 2 capsules twice daily and discussed the differences between this and regular fish oil To do fasting lipids on the next visit  RENAL dysfunction will be followed closely  More regular follow-up recommended  Counseling time on subjects discussed in assessment and plan sections is over 50% of today's 25 minute visit     Patient Instructions  Check blood sugars on waking up  2-3/7  Also check blood sugars about 2 hours after a meal and do this after different meals by rotation  Recommended blood sugar levels on waking up is 90-130 and about 2 hours after meal is 130-160  Please bring your blood sugar monitor to each visit, thank you  Repaglanide 1 pill at supper     Elayne Snare 12/22/2017, 8:37 PM   Note: This office note was prepared with Dragon voice recognition system technology. Any transcriptional errors that  result from this process are unintentional.

## 2017-12-22 NOTE — Progress Notes (Signed)
Subjective:   [name] is a 72 y.o. male who presents for Medicare Annual/Subsequent preventive examination.  Reports health as good now 66 MINUTE VISIT REQUESTED Diabetes Dr. Dwyane Dee 5/21 A1c 7.1  Recent MI but recovering well per his report    Diet Has been trying to reduce fried -high fat foods  Oatmeal in the am with yogurt  Recent MI  Placed on more medicine  Exercise Walking 2 miles at a time Goes his own pace for relaxation 5 days a week   BMI 25   Health Maintenance Due  Topic Date Due  . OPHTHALMOLOGY EXAM  05/04/2017   Eye exam it is time to schedule  Educated regarding shingrix; will discuss with the HIV nurse  He has a foot exam by podiatrist  No issues at present   Colonoscopy has been postponed for another year by GI  Due to his MI; will change in epic   Meds; he keep up with them all     Cardiac Risk Factors include: advanced age (>76men, >1 women)         Objective:    Vitals: BP 100/60   Pulse 60   Ht 5\' 3"  (1.6 m)   Wt 144 lb (65.3 kg)   SpO2 98%   BMI 25.51 kg/m   Body mass index is 25.51 kg/m.  Advanced Directives 12/23/2017 10/21/2017 10/20/2017 12/01/2016 11/29/2016 11/22/2016 10/14/2016  Does Patient Have a Medical Advance Directive? No - No - No No No  Would patient like information on creating a medical advance directive? - No - Patient declined - No - Patient declined - No - Patient declined Yes (MAU/Ambulatory/Procedural Areas - Information given)  Pre-existing out of facility DNR order (yellow form or pink MOST form) - - - - - - -   Has some information at home   Tobacco Social History   Tobacco Use  Smoking Status Never Smoker  Smokeless Tobacco Never Used     Counseling given: Yes  Will try to complete AD; Given copy  Referred to [facility] for questions [facility] offers free advance directive forms, as well as assistance in completing the forms themselves. For assistance, contact the Spiritual Care Department at  [phone], or the Clinical Social Work Department at [phone].    Clinical Intake:      Past Medical History:  Diagnosis Date  . Absolute anemia 05/28/2014  . Aortic stenosis 09/17/2015  . Arthritis   . Ascites 11/18/2013  . BELLS PALSY 07/19/2010   Qualifier: Diagnosis of  By: [name], [name]    . CAD (coronary artery disease)    a. s/p CABG in 2003 b. s/p PCI to SVG-PDA in 07/2015 c. 11/2016: cath showing severe native CAD with patent LIMA-LAD and SVG-D1 with 80% stenosis of SVG-OM1-OM2. Initially medical management was recommended --> presented with recurrent angina --> s/p Synergy DES to proximal body of SVG-OM1-OM2, POBA to distal graft.   Marland Kitchen CAD- S/P PCI SVG-OM1 12/01/16 05/12/2006   Qualifier: Diagnosis of  By: [name], [name]    . Carotid bruit 07/10/2015  . Chest pain 11/22/2016  . Chronic kidney disease   . Chronic renal insufficiency, stage III (moderate) ([place]) 12/02/2016  . Constipation 05/28/2014  . COPD 07/20/2008   Qualifier: Diagnosis of  By: [name], [name]   . DEPRESSION 09/03/2006   Qualifier: Diagnosis of  By: [name], [name]    . Dermatitis 11/27/2012  . Diabetes mellitus   . Diarrhea 11/20/2013  .  DM (diabetes mellitus), type 2 with ophthalmic complications ([place]) 11/28/621   Qualifier: Diagnosis of  By: [name], [name]    . Dry eye syndrome 12/20/2010  . Epicondylitis 06/05/2013   right  . Erectile dysfunction 01/01/2012  . Essential hypertension 05/12/2006   Qualifier: Diagnosis of  By: [name], [name]    . GENITAL HERPES 05/03/2009   Qualifier: Diagnosis of  By: [name], [name]    . GERD 09/03/2006   Qualifier: Diagnosis of  By: [name], [name]    . GERD (gastroesophageal reflux disease)   . HEARING LOSS, SENSORINEURAL 05/12/2006   Qualifier: Diagnosis of  By: [name], [name]    . HEMATOCHEZIA 04/18/2008   Annotation: 9/09 during bout of constipation Qualifier: Diagnosis of  By: [name], [name]    . HIP PAIN, BILATERAL 07/17/2008    Qualifier: Diagnosis of  By: [name], [name]   . History of depression   . History of kidney stones   . HIV (human immunodeficiency virus infection) ([place]) 1991   on meds since initial dx.   Marland Kitchen HLD (hyperlipidemia) 11/19/2013  . Human immunodeficiency virus (HIV) disease ([place]) 05/12/2006   Qualifier: Diagnosis of  By: [name], [name]    . Hx of CABG 09/03/2006   Annotation: 2003 Qualifier: Diagnosis of  By: [name], [name]    . Hyperkalemia 03/23/2014  . Hyperlipidemia   . Hypertension   . Hyponatremia 03/23/2014  . INGUINAL LYMPHADENOPATHY, RIGHT 04/03/2009   Qualifier: Diagnosis of  By: [name], [name]    . Keratoma 01/24/2015  . KNEE PAIN, BILATERAL 07/17/2008   Qualifier: Diagnosis of  By: [name], [name]   . Lesion of breast 07/21/2015  . Lipodystrophy 12/20/2010  . Memory loss 09/18/2008   Qualifier: Diagnosis of  By: [name], [name]   . Metatarsal deformity 01/24/2015  . Nasal abscess 12/04/2015  . Night sweats 07/06/2012  . Nocturia 03/06/2013  . NSTEMI (non-ST elevated myocardial infarction) ([place]) 11/29/2016  . Pain in joint, ankle and foot 01/19/2015  . Pancreatitis 11/2013   attributed to HIV meds.   . Pedal edema 05/21/2014  . PERIPHERAL VASCULAR DISEASE 07/20/2008   Qualifier: Diagnosis of  By: [name], [name]   . Posterior cervical lymphadenopathy 03/30/2014  . Protein-calorie malnutrition, severe ([place]) 03/23/2014  . SHINGLES, HX OF 05/03/2009   Annotation: R leg Qualifier: Diagnosis of  By: [name], [name]    . STEMI (ST elevation myocardial infarction) ([place]) 07/29/2015  . Unstable angina ([place]) 11/24/2016  . WEIGHT LOSS, ABNORMAL 04/03/2009   Qualifier: Diagnosis of  By: [name], [name]     Past Surgical History:  Procedure Laterality Date  . CARDIAC CATHETERIZATION N/A 07/29/2015   Procedure: Left Heart Cath and Coronary Angiography;  Surgeon: [name], MD;  Location: [place] [facility];  Service: Cardiovascular;  Laterality: N/A;  . CARDIAC CATHETERIZATION  N/A 07/29/2015   Procedure: Coronary Stent Intervention;  Surgeon: [name], MD;  Location: [place] [facility];  Service: Cardiovascular;  Laterality: N/A;  . CORONARY ARTERY BYPASS GRAFT  05/2002  . CORONARY STENT INTERVENTION N/A 12/01/2016   Procedure: Coronary Stent Intervention;  Surgeon: [name], MD;  Location: [place] [facility];  Service: Cardiovascular;  Laterality: N/A;  . CORONARY STENT INTERVENTION N/A 10/21/2017   Procedure: CORONARY STENT INTERVENTION;  Surgeon: Jettie Booze, MD;  Location: [place] [facility];  Service: Cardiovascular;  Laterality: N/A;  . LEFT HEART  CATH AND CORS/GRAFTS ANGIOGRAPHY N/A 11/24/2016   Procedure: Left Heart Cath and Cors/Grafts Angiography;  Surgeon: Nelva Bush, MD;  Location: [place] [facility];  Service: Cardiovascular;  Laterality: N/A;  . LEFT HEART CATH AND CORS/GRAFTS ANGIOGRAPHY N/A 12/01/2016   Procedure: Left Heart Cath and Cors/Grafts Angiography;  Surgeon: [name], MD;  Location: [place] [facility];  Service: Cardiovascular;  Laterality: N/A;  . LEFT HEART CATH AND CORS/GRAFTS ANGIOGRAPHY N/A 10/21/2017   Procedure: LEFT HEART CATH AND CORS/GRAFTS ANGIOGRAPHY;  Surgeon: Jettie Booze, MD;  Location: [place] [facility];  Service: Cardiovascular;  Laterality: N/A;  . VASECTOMY     Family History  Problem Relation Age of Onset  . Hyperlipidemia Mother   . Diabetes Mother        paternal grandparents/1 brother  . Hyperlipidemia Father   . Hypertension Father        paternal grandmother/3 brothers/1 sister  . Arthritis Unknown        mother/father/paternal grandparents  . Breast cancer Maternal Aunt        paternal aunt  . Lung cancer Maternal Aunt   . Heart disease Unknown        parents/maternal grandparents/ 2 brothers  . Stroke Paternal Grandmother   . Mental retardation Sister   . Colon cancer Neg Hx   . Esophageal cancer Neg Hx   . Pancreatic cancer Neg Hx   . Stomach cancer Neg Hx   .  Liver disease Neg Hx    Social History   Socioeconomic History  . Marital status: Divorced    Spouse name: Not on file  . Number of children: 4  . Years of education: Not on file  . Highest education level: Not on file  Occupational History  . 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   Social Needs  . Financial resource strain: Not on file  . Food insecurity:    Worry: Not on file    Inability: Not on file  . Transportation needs:    Medical: Not on file    Non-medical: Not on file  Tobacco Use  . Smoking status: Never Smoker  . Smokeless tobacco: Never Used  Substance and Sexual Activity  . Alcohol use: Yes    Alcohol/week: 0.0 oz    Comment: rare  . Drug use: No  . Sexual activity: Not Currently    Comment: declined condoms  Lifestyle  . Physical activity:    Days per week: Not on file    Minutes per session: Not on file  . Stress: Not on file  Relationships  . Social connections:    Talks on phone: Not on file    Gets together: Not on file    Attends religious service: Not on file    Active member of club or organization: Not on file    Attends meetings of clubs or organizations: Not on file    Relationship status: Not on file  Other Topics Concern  . Not on file  Social History Narrative   Lives alone.  Supportive friends and family.  His HIV Dx is not a secret.     Outpatient Encounter Medications as of 12/23/2017  Medication Sig  . acetaminophen (TYLENOL) 325 MG tablet Take 2 tablets (650 mg total) by mouth every 4 (four) hours as needed for headache or mild pain.  Marland Kitchen aspirin 81 MG chewable tablet Chew 81 mg by mouth daily.  Marland Kitchen  atorvastatin (LIPITOR) 20 MG tablet Take 1 tablet (20 mg total) by mouth daily.  Marland Kitchen b complex vitamins tablet Take 1 tablet by mouth daily.  . benzonatate (TESSALON) 200 MG capsule Take 1 capsule (200 mg total) by mouth 2 (two) times daily as needed for cough.  . Cholecalciferol  2000 units CAPS Take 1 capsule (2,000 Units total) by mouth daily.  . clopidogrel (PLAVIX) 75 MG tablet Take 1 tablet (75 mg total) by mouth daily.  . dolutegravir (TIVICAY) 50 MG tablet Take 1 tablet (50 mg total) by mouth daily.  . doravirine (PIFELTRO) 100 MG TABS tablet Take 1 tablet (100 mg total) by mouth daily.  . fenofibrate 54 MG tablet Take 1 tablet (54 mg total) by mouth daily.  . fluticasone (FLONASE) 50 MCG/ACT nasal spray Place 2 sprays into both nostrils daily.  Marland Kitchen FREESTYLE TEST STRIPS test strip USE TO CHECK BLOOD SUGAR THREE TIMES DAILY  . furosemide (LASIX) 20 MG tablet TAKE 2 TABLETS BY MOUTH DAILY AS NEEDED FOR FLUID RETENTION OR SWELLING (Patient taking differently: TAKE 1 TABLETS BY MOUTH DAILY AS NEEDED FOR FLUID RETENTION OR SWELLING)  . Icosapent Ethyl (VASCEPA) 1 g CAPS Take 2 capsules (2 g total) by mouth 2 (two) times daily.  . isosorbide mononitrate (IMDUR) 30 MG 24 hr tablet TAKE 1 TABLET(30 MG) BY MOUTH DAILY (Patient taking differently: 15mg  by mouth twice daily)  . Lancets (FREESTYLE) lancets Use as directed three times a day to check blood sugar.  DX E11.9  . lisinopril (PRINIVIL,ZESTRIL) 5 MG tablet Take 1 tablet (5 mg total) by mouth daily.  . metFORMIN (GLUCOPHAGE-XR) 500 MG 24 hr tablet Take 500 mg by mouth daily with breakfast.  . metoprolol tartrate (LOPRESSOR) 25 MG tablet TAKE 1/2 TABLET BY MOUTH TWICE DAILY  . neomycin-polymyxin-hydrocortisone (CORTISPORIN) otic solution INSTILL 3 DROPS IN BOTH EARS THREE TIMES DAILY (Patient taking differently: 3 drops into both ears daily as needed)  . nitroGLYCERIN (NITROSTAT) 0.4 MG SL tablet Place 1 tablet (0.4 mg total) under the tongue every 5 (five) minutes as needed for chest pain.  . Omega-3 Fatty Acids (FISH OIL) 1000 MG CAPS Take 1 capsule by mouth 2 (two) times daily. Takes 2 daily   . ondansetron (ZOFRAN) 4 MG tablet Take 1 tablet (4 mg total) by mouth every 8 (eight) hours as needed for nausea or vomiting.    . pantoprazole (PROTONIX) 40 MG tablet Take 1 tablet (40 mg total) by mouth daily.  . pioglitazone (ACTOS) 15 MG tablet TAKE 1 TABLET(15 MG) BY MOUTH DAILY (Patient taking differently: 15mg  by mouth once daily)  . potassium chloride SA (K-DUR,KLOR-CON) 20 MEQ tablet TAKE 1 TABLET BY MOUTH THREE TIMES DAILY (Patient taking differently: 76meq by mouth three times daily)  . Probiotic Product (PROBIOTIC DAILY) CAPS Take 1 by mouth daily  . repaglinide (PRANDIN) 2 MG tablet TAKE 2 TABLETS BY MOUTH BEFORE BREAKFAST, 1 TABLET BY MOUTH BEFORE LUNCH AND 2 TABLETS BY MOUTH BEFORE SUPPER (Patient taking differently: No sig reported)  . sodium bicarbonate 650 MG tablet Take 650 mg by mouth 3 (three) times daily.  Marland Kitchen triamcinolone cream (KENALOG) 0.1 % apply twice daily as needed to dry, itchy skin  . valACYclovir (VALTREX) 500 MG tablet TAKE 1 TABLET BY MOUTH DAILY   No facility-administered encounter medications on file as of 12/23/2017.     Activities of Daily Living In your present state of health, do you have any difficulty performing the following activities: 12/23/2017 10/20/2017  Hearing? N N  Vision? N N  Difficulty concentrating or making decisions? N N  Walking or climbing stairs? N N  Dressing or bathing? N N  Doing errands, shopping? N N  Preparing Food and eating ? N -  Using the Toilet? N -  In the past six months, have you accidently leaked urine? N -  Comment states he has bladder issues; had an apt scheduled  -  Do you have problems with loss of bowel control? N -  Managing your Medications? N -  Managing your Finances? N -  Housekeeping or managing your Housekeeping? N -  Some recent data might be hidden    Patient Care Team: Dorothyann Peng, NP as PCP - General (Family Medicine) Michel Bickers, MD as PCP - Infectious Diseases (Infectious Diseases) Stanford Breed Denice Bors, MD as PCP - Cardiology (Cardiology) Royann Shivers, MD as Referring Physician (Nephrology) Elayne Snare, MD as  Consulting Physician (Endocrinology) Milus Banister, MD as Attending Physician (Gastroenterology) Lelon Perla, MD as Consulting Physician (Cardiology) Joseph Art, OD as Referring Physician (Optometry) Festus Aloe, MD as Consulting Physician (Urology)   Assessment:   This is a routine wellness examination for [name].  Exercise Activities and Dietary recommendations Current Exercise Habits: Home exercise routine, Type of exercise: walking, Time (Minutes): 40, Frequency (Times/Week): 5, Weekly Exercise (Minutes/Week): 200, Intensity: Mild  Goals    . Continue to exercise regularly.   (pt-stated)       Fall Risk Fall Risk  12/23/2017 07/23/2017 10/14/2016 04/15/2016 10/29/2015  Falls in the past year? No No No No No  Risk for fall due to : - - - - -  Risk for fall due to: Comment - - - - -    Depression Screen PHQ 2/9 Scores 12/23/2017 07/23/2017 10/14/2016 11/21/2015  PHQ - 2 Score 0 2 2 0  PHQ- 9 Score - 2 3 -    Cognitive Function MMSE - Mini Mental State Exam 12/23/2017 10/14/2016 10/12/2015  Not completed: (No Data) - -  Orientation to time - 4 5  Orientation to Place - 5 5  Registration - 3 3  Attention/ Calculation - 4 5  Recall - 2 3  Language- name 2 objects - 2 2  Language- repeat - 1 1  Language- follow 3 step command - 3 3  Language- read & follow direction - 1 1  Write a sentence - 1 1  Copy design - 1 1  Total score - 27 30     no issues noted today at assessment No failures of daily living Ad8 score neg     Immunization History  Administered Date(s) Administered  . H1N1 06/09/2008  . Influenza Split 05/01/2011, 05/18/2012  . Influenza Whole 09/23/2004, 06/01/2007, 05/03/2008, 05/03/2009, 04/10/2010  . Influenza, High Dose Seasonal PF 06/02/2013, 05/14/2016, 05/19/2017  . Influenza,inj,Quad PF,6+ Mos 05/03/2014, 04/24/2015  . PPD Test 12/04/2009  . Pneumococcal Conjugate-13 10/14/2016  . Pneumococcal Polysaccharide-23 02/02/2007,  07/04/2010, 04/24/2015  . Td 11/24/2005  . Tdap 12/04/2015      Screening Tests Health Maintenance  Topic Date Due  . OPHTHALMOLOGY EXAM  05/04/2017  . COLONOSCOPY  08/03/2018 (Originally 07/21/2013)  . INFLUENZA VACCINE  03/04/2018  . HEMOGLOBIN A1C  04/17/2018  . FOOT EXAM  12/24/2018  . TETANUS/TDAP  12/03/2025  . Hepatitis C Screening  Completed  . PNA vac Low Risk Adult  Completed        Plan:      PCP Notes  Health Maintenance ye exam it is time to schedule  Educated regarding shingrix; will discuss with the HIV nurse  He has a foot exam by podiatrist  No issues at present   Colonoscopy has been postponed for another year by GI  Due to his MI; will change in epic   Meds; he keep up with them all    Abnormal Screens  None noted  Referrals  States he has a podiatrist here in [place], will reschedule apt due to recent MI   Patient concerns; Will discuss shingrix vaccine with his HIV  Nurse   Nurse Concerns; As noted  Next PCP apt [facility] Just seen in April 16    I have personally reviewed and noted the following in the patient's chart:   . Medical and social history . Use of alcohol, tobacco or illicit drugs  . Current medications and supplements . Functional ability and status . Nutritional status . Physical activity . Advanced directives . List of other physicians . Hospitalizations, surgeries, and ER visits in previous 12 months . Vitals . Screenings to include cognitive, depression, and falls . Referrals and appointments  In addition, I have reviewed and discussed with patient certain preventive protocols, quality metrics, and best practice recommendations. A written personalized care plan for preventive services as well as general preventive health recommendations were provided to patient.     Wynetta Fines, RN  12/23/2017

## 2017-12-23 ENCOUNTER — Ambulatory Visit (INDEPENDENT_AMBULATORY_CARE_PROVIDER_SITE_OTHER): Payer: Medicare Other

## 2017-12-23 VITALS — BP 100/60 | HR 60 | Ht 63.0 in | Wt 144.0 lb

## 2017-12-23 DIAGNOSIS — Z0000 Encounter for general adult medical examination without abnormal findings: Secondary | ICD-10-CM

## 2017-12-23 NOTE — Patient Instructions (Addendum)
[name] , Thank you for taking time to come for your Medicare Wellness Visit. I appreciate your ongoing commitment to your health goals. Please review the following plan we discussed and let me know if I can assist you in the future.   Will schedule your eye exam soon   Is seeing a podiatrist here in [place]   Educated to check with insurance regarding coverage of Shingles vaccination on Part D or Part B and may have lower co-pay if provided on the Part D side Will check with your HIV nurse on the new shingrix  Shingrix is a vaccine for the prevention of Shingles in Adults 50 and older.  If you are on Medicare, the shingrix is covered under your Part D plan, so you will take both of the vaccines in the series at your pharmacy. Please check with your benefits regarding applicable copays or out of pocket expenses.  The Shingrix is given in 2 vaccines approx 8 weeks apart. You must receive the 2nd dose prior to 6 months from receipt of the first. Please have the pharmacist print out you Immunization  dates for our office records   Will try to complete AD; Given copy  Referred to [facility] for questions [place] offers free advance directive forms, as well as assistance in completing the forms themselves. For assistance, contact the Spiritual Care Department at [phone], or the Clinical Social Work Department at [phone].  Deaf & Hard of Hearing Division Services - can assist with hearing aid x 1  No reviews  [employer] [facility]  [place] [address]  [phone] - [place] Leigh  [email or web address]_[email or web address]  States the GI physician is postponing his colonoscopy x 1 year and date postponed as reported   These are the goals we discussed: Goals    . Continue to exercise regularly.   (pt-stated)       This is a list of the screening recommended for you and due dates:  Health Maintenance  Topic Date  Due  . Eye exam for diabetics  05/04/2017  . Colon Cancer Screening  07/21/2017  . Complete foot exam   09/26/2017  . Flu Shot  03/04/2018  . Hemoglobin A1C  04/17/2018  . Tetanus Vaccine  12/03/2025  .  Hepatitis C: One time screening is recommended by Center for Disease Control  (CDC) for  adults born from [birth year] through 1963.   Completed  . Pneumonia vaccines  Completed   '  Fall Prevention in the Home Falls can cause injuries. They can happen to people of all ages. There are many things you can do to make your home safe and to help prevent falls. What can I do on the outside of my home?  Regularly fix the edges of walkways and driveways and fix any cracks.  Remove anything that might make you trip as you walk through a door, such as a raised step or threshold.  Trim any bushes or trees on the path to your home.  Use bright outdoor lighting.  Clear any walking paths of anything that might make someone trip, such as rocks or tools.  Regularly check to see if handrails are loose or broken. Make sure that both sides of any steps have handrails.  Any raised decks and porches should have guardrails on the edges.  Have any leaves, snow, or ice cleared regularly.  Use sand or salt on walking paths during winter.  Clean up any spills in your garage right away.  This includes oil or grease spills. What can I do in the bathroom?  Use night lights.  Install grab bars by the toilet and in the tub and shower. Do not use towel bars as grab bars.  Use non-skid mats or decals in the tub or shower.  If you need to sit down in the shower, use a plastic, non-slip stool.  Keep the floor dry. Clean up any water that spills on the floor as soon as it happens.  Remove soap buildup in the tub or shower regularly.  Attach bath mats securely with double-sided non-slip rug tape.  Do not have throw rugs and other things on the floor that can make you trip. What can I do in the bedroom?  Use  night lights.  Make sure that you have a light by your bed that is easy to reach.  Do not use any sheets or blankets that are too big for your bed. They should not hang down onto the floor.  Have a firm chair that has side arms. You can use this for support while you get dressed.  Do not have throw rugs and other things on the floor that can make you trip. What can I do in the kitchen?  Clean up any spills right away.  Avoid walking on wet floors.  Keep items that you use a lot in easy-to-reach places.  If you need to reach something above you, use a strong step stool that has a grab bar.  Keep electrical cords out of the way.  Do not use floor polish or wax that makes floors slippery. If you must use wax, use non-skid floor wax.  Do not have throw rugs and other things on the floor that can make you trip. What can I do with my stairs?  Do not leave any items on the stairs.  Make sure that there are handrails on both sides of the stairs and use them. Fix handrails that are broken or loose. Make sure that handrails are as long as the stairways.  Check any carpeting to make sure that it is firmly attached to the stairs. Fix any carpet that is loose or worn.  Avoid having throw rugs at the top or bottom of the stairs. If you do have throw rugs, attach them to the floor with carpet tape.  Make sure that you have a light switch at the top of the stairs and the bottom of the stairs. If you do not have them, ask someone to add them for you. What else can I do to help prevent falls?  Wear shoes that: ? Do not have high heels. ? Have rubber bottoms. ? Are comfortable and fit you well. ? Are closed at the toe. Do not wear sandals.  If you use a stepladder: ? Make sure that it is fully opened. Do not climb a closed stepladder. ? Make sure that both sides of the stepladder are locked into place. ? Ask someone to hold it for you, if possible.  Clearly mark and make sure that you  can see: ? Any grab bars or handrails. ? First and last steps. ? Where the edge of each step is.  Use tools that help you move around (mobility aids) if they are needed. These include: ? Canes. ? Walkers. ? Scooters. ? Crutches.  Turn on the lights when you go into a dark area. Replace any light bulbs as soon as they burn out.  Set up your furniture  so you have a clear path. Avoid moving your furniture around.  If any of your floors are uneven, fix them.  If there are any pets around you, be aware of where they are.  Review your medicines with your doctor. Some medicines can make you feel dizzy. This can increase your chance of falling. Ask your doctor what other things that you can do to help prevent falls. This information is not intended to replace advice given to you by your health care provider. Make sure you discuss any questions you have with your health care provider. Document Released: 05/17/2009 Document Revised: 12/27/2015 Document Reviewed: 08/25/2014 Elsevier Interactive Patient Education  2018 [place] Maintenance, Male A healthy lifestyle and preventive care is important for your health and wellness. Ask your health care provider about what schedule of regular examinations is right for you. What should I know about weight and diet? Eat a Healthy Diet  Eat plenty of vegetables, fruits, whole grains, low-fat dairy products, and lean protein.  Do not eat a lot of foods high in solid fats, added sugars, or salt.  Maintain a Healthy Weight Regular exercise can help you achieve or maintain a healthy weight. You should:  Do at least 150 minutes of exercise each week. The exercise should increase your heart rate and make you sweat (moderate-intensity exercise).  Do strength-training exercises at least twice a week.  Watch Your Levels of Cholesterol and Blood Lipids  Have your blood tested for lipids and cholesterol every 5 years starting at 70 years  of age. If you are at high risk for heart disease, you should start having your blood tested when you are 70 years old. You may need to have your cholesterol levels checked more often if: ? Your lipid or cholesterol levels are high. ? You are older than 70 years of age. ? You are at high risk for heart disease.  What should I know about cancer screening? Many types of cancers can be detected early and may often be prevented. Lung Cancer  You should be screened every year for lung cancer if: ? You are a current smoker who has smoked for at least 30 years. ? You are a former smoker who has quit within the past 15 years.  Talk to your health care provider about your screening options, when you should start screening, and how often you should be screened.  Colorectal Cancer  Routine colorectal cancer screening usually begins at 70 years of age and should be repeated every 5-10 years until you are 70 years old. You may need to be screened more often if early forms of precancerous polyps or small growths are found. Your health care provider may recommend screening at an earlier age if you have risk factors for colon cancer.  Your health care provider may recommend using home test kits to check for hidden blood in the stool.  A small camera at the end of a tube can be used to examine your colon (sigmoidoscopy or colonoscopy). This checks for the earliest forms of colorectal cancer.  Prostate and Testicular Cancer  Depending on your age and overall health, your health care provider may do certain tests to screen for prostate and testicular cancer.  Talk to your health care provider about any symptoms or concerns you have about testicular or prostate cancer.  Skin Cancer  Check your skin from head to toe regularly.  Tell your health care provider about any new moles or changes in  moles, especially if: ? There is a change in a mole's size, shape, or color. ? You have a mole that is larger  than a pencil eraser.  Always use sunscreen. Apply sunscreen liberally and repeat throughout the day.  Protect yourself by wearing long sleeves, pants, a wide-brimmed hat, and sunglasses when outside.  What should I know about heart disease, diabetes, and high blood pressure?  If you are 107-71 years of age, have your blood pressure checked every 3-5 years. If you are 76 years of age or older, have your blood pressure checked every year. You should have your blood pressure measured twice-once when you are at a hospital or clinic, and once when you are not at a hospital or clinic. Record the average of the two measurements. To check your blood pressure when you are not at a hospital or clinic, you can use: ? An automated blood pressure machine at a pharmacy. ? A home blood pressure monitor.  Talk to your health care provider about your target blood pressure.  If you are between 24-68 years old, ask your health care provider if you should take aspirin to prevent heart disease.  Have regular diabetes screenings by checking your fasting blood sugar level. ? If you are at a normal weight and have a low risk for diabetes, have this test once every three years after the age of 35. ? If you are overweight and have a high risk for diabetes, consider being tested at a younger age or more often.  A one-time screening for abdominal aortic aneurysm (AAA) by ultrasound is recommended for men aged 31-75 years who are current or former smokers. What should I know about preventing infection? Hepatitis B If you have a higher risk for hepatitis B, you should be screened for this virus. Talk with your health care provider to find out if you are at risk for hepatitis B infection. Hepatitis C Blood testing is recommended for:  Everyone born from [birth year] through 1963.  Anyone with known risk factors for hepatitis C.  Sexually Transmitted Diseases (STDs)  You should be screened each year for STDs including  gonorrhea and chlamydia if: ? You are sexually active and are younger than 70 years of age. ? You are older than 70 years of age and your health care provider tells you that you are at risk for this type of infection. ? Your sexual activity has changed since you were last screened and you are at an increased risk for chlamydia or gonorrhea. Ask your health care provider if you are at risk.  Talk with your health care provider about whether you are at high risk of being infected with HIV. Your health care provider may recommend a prescription medicine to help prevent HIV infection.  What else can I do?  Schedule regular health, dental, and eye exams.  Stay current with your vaccines (immunizations).  Do not use any tobacco products, such as cigarettes, chewing tobacco, and e-cigarettes. If you need help quitting, ask your health care provider.  Limit alcohol intake to no more than 2 drinks per day. One drink equals 12 ounces of beer, 5 ounces of wine, or 1 ounces of hard liquor.  Do not use street drugs.  Do not share needles.  Ask your health care provider for help if you need support or information about quitting drugs.  Tell your health care provider if you often feel depressed.  Tell your health care provider if you have ever been abused  or do not feel safe at home. This information is not intended to replace advice given to you by your health care provider. Make sure you discuss any questions you have with your health care provider. Document Released: 01/17/2008 Document Revised: 03/19/2016 Document Reviewed: 04/24/2015 Elsevier Interactive Patient Education  2018 [employer].   Hearing Loss Hearing loss is a partial or total loss of the ability to hear. This can be temporary or permanent, and it can happen in one or both ears. Hearing loss may be referred to as deafness. Medical care is necessary to treat hearing loss properly and to prevent the condition from getting worse.  Your hearing may partially or completely come back, depending on what caused your hearing loss and how severe it is. In some cases, hearing loss is permanent. What are the causes? Common causes of hearing loss include:  Too much wax in the ear canal.  Infection of the ear canal or middle ear.  Fluid in the middle ear.  Injury to the ear or surrounding area.  An object stuck in the ear.  Prolonged exposure to loud sounds, such as music.  Less common causes of hearing loss include:  Tumors in the ear.  Viral or bacterial infections, such as meningitis.  A hole in the eardrum (perforated eardrum).  Problems with the hearing nerve that sends signals between the brain and the ear.  Certain medicines.  What are the signs or symptoms? Symptoms of this condition may include:  Difficulty telling the difference between sounds.  Difficulty following a conversation when there is background noise.  Lack of response to sounds in your environment. This may be most noticeable when you do not respond to startling sounds.  Needing to turn up the volume on the television, radio, etc.  Ringing in the ears.  Dizziness.  Pain in the ears.  How is this diagnosed? This condition is diagnosed based on a physical exam and a hearing test (audiometry). The audiometry test will be performed by a hearing specialist (audiologist). You may also be referred to an ear, nose, and throat (ENT) specialist (otolaryngologist). How is this treated? Treatment for recent onset of hearing loss may include:  Ear wax removal.  Being prescribed medicines to prevent infection (antibiotics).  Being prescribed medicines to reduce inflammation (corticosteroids).  Follow these instructions at home:  If you were prescribed an antibiotic medicine, take it as told by your health care provider. Do not stop taking the antibiotic even if you start to feel better.  Take over-the-counter and prescription medicines  only as told by your health care provider.  Avoid loud noises.  Return to your normal activities as told by your health care provider. Ask your health care provider what activities are safe for you.  Keep all follow-up visits as told by your health care provider. This is important. Contact a health care provider if:  You feel dizzy.  You develop new symptoms.  You vomit or feel nauseous.  You have a fever. Get help right away if:  You develop sudden changes in your vision.  You have severe ear pain.  You have new or increased weakness.  You have a severe headache. This information is not intended to replace advice given to you by your health care provider. Make sure you discuss any questions you have with your health care provider. Document Released: 07/21/2005 Document Revised: 12/27/2015 Document Reviewed: 12/06/2014 Elsevier Interactive Patient Education  2018 [employer].

## 2017-12-23 NOTE — Progress Notes (Signed)
I have reviewed documentation for AWV and Advance Care Planning provided by the health coach and agree with documentation. I was immediately available for questions.  

## 2018-01-01 ENCOUNTER — Encounter: Payer: Self-pay | Admitting: Adult Health

## 2018-01-07 ENCOUNTER — Other Ambulatory Visit: Payer: Medicare Other

## 2018-01-07 DIAGNOSIS — B20 Human immunodeficiency virus [HIV] disease: Secondary | ICD-10-CM

## 2018-01-08 LAB — COMPREHENSIVE METABOLIC PANEL
AG RATIO: 1.8 (calc) (ref 1.0–2.5)
ALT: 27 U/L (ref 9–46)
AST: 21 U/L (ref 10–35)
Albumin: 4.4 g/dL (ref 3.6–5.1)
Alkaline phosphatase (APISO): 38 U/L — ABNORMAL LOW (ref 40–115)
BUN / CREAT RATIO: 14 (calc) (ref 6–22)
BUN: 24 mg/dL (ref 7–25)
CHLORIDE: 111 mmol/L — AB (ref 98–110)
CO2: 24 mmol/L (ref 20–32)
Calcium: 9.3 mg/dL (ref 8.6–10.3)
Creat: 1.67 mg/dL — ABNORMAL HIGH (ref 0.70–1.25)
GLOBULIN: 2.5 g/dL (ref 1.9–3.7)
GLUCOSE: 98 mg/dL (ref 65–99)
POTASSIUM: 5 mmol/L (ref 3.5–5.3)
SODIUM: 143 mmol/L (ref 135–146)
TOTAL PROTEIN: 6.9 g/dL (ref 6.1–8.1)
Total Bilirubin: 0.4 mg/dL (ref 0.2–1.2)

## 2018-01-08 LAB — CBC
HCT: 39 % (ref 38.5–50.0)
Hemoglobin: 13.3 g/dL (ref 13.2–17.1)
MCH: 32.1 pg (ref 27.0–33.0)
MCHC: 34.1 g/dL (ref 32.0–36.0)
MCV: 94.2 fL (ref 80.0–100.0)
MPV: 10.8 fL (ref 7.5–12.5)
PLATELETS: 163 10*3/uL (ref 140–400)
RBC: 4.14 10*6/uL — ABNORMAL LOW (ref 4.20–5.80)
RDW: 13.8 % (ref 11.0–15.0)
WBC: 3.4 10*3/uL — AB (ref 3.8–10.8)

## 2018-01-08 LAB — T-HELPER CELL (CD4) - (RCID CLINIC ONLY)
CD4 T CELL ABS: 320 /uL — AB (ref 400–2700)
CD4 T CELL HELPER: 44 % (ref 33–55)

## 2018-01-08 LAB — RPR: RPR: NONREACTIVE

## 2018-01-10 ENCOUNTER — Other Ambulatory Visit: Payer: Self-pay | Admitting: Endocrinology

## 2018-01-12 LAB — HIV-1 RNA QUANT-NO REFLEX-BLD
HIV 1 RNA QUANT: NOT DETECTED {copies}/mL
HIV-1 RNA QUANT, LOG: NOT DETECTED {Log_copies}/mL

## 2018-01-15 ENCOUNTER — Encounter: Payer: Self-pay | Admitting: Adult Health

## 2018-01-15 ENCOUNTER — Ambulatory Visit (INDEPENDENT_AMBULATORY_CARE_PROVIDER_SITE_OTHER): Payer: Medicare Other | Admitting: Adult Health

## 2018-01-15 VITALS — BP 142/64 | Temp 97.7°F | Ht 63.5 in | Wt 143.0 lb

## 2018-01-15 DIAGNOSIS — E782 Mixed hyperlipidemia: Secondary | ICD-10-CM | POA: Diagnosis not present

## 2018-01-15 DIAGNOSIS — F339 Major depressive disorder, recurrent, unspecified: Secondary | ICD-10-CM

## 2018-01-15 DIAGNOSIS — I10 Essential (primary) hypertension: Secondary | ICD-10-CM

## 2018-01-15 DIAGNOSIS — E11319 Type 2 diabetes mellitus with unspecified diabetic retinopathy without macular edema: Secondary | ICD-10-CM | POA: Diagnosis not present

## 2018-01-15 DIAGNOSIS — I25810 Atherosclerosis of coronary artery bypass graft(s) without angina pectoris: Secondary | ICD-10-CM | POA: Diagnosis not present

## 2018-01-15 NOTE — Progress Notes (Signed)
Subjective:    Patient ID: [name], male    DOB: [birth year], 72 y.o.   MRN: [id number]  HPI Patient presents for yearly follow up examination. He is a very pleasant 72 year old male who  has a past medical history of Absolute anemia (05/28/2014), Aortic stenosis (09/17/2015), Arthritis, Ascites (11/18/2013), BELLS PALSY (07/19/2010), CAD (coronary artery disease), CAD- S/P PCI SVG-OM1 12/01/16 (05/12/2006), Carotid bruit (07/10/2015), Chest pain (11/22/2016), Chronic kidney disease, Chronic renal insufficiency, stage III (moderate) ([place]) (12/02/2016), Constipation (05/28/2014), COPD (07/20/2008), DEPRESSION (09/03/2006), Dermatitis (11/27/2012), Diabetes mellitus, Diarrhea (11/20/2013), DM (diabetes mellitus), type 2 with ophthalmic complications ([place]) (01/31/5283), Dry eye syndrome (12/20/2010), Epicondylitis (06/05/2013), Erectile dysfunction (01/01/2012), Essential hypertension (05/12/2006), GENITAL HERPES (05/03/2009), GERD (09/03/2006), GERD (gastroesophageal reflux disease), HEARING LOSS, SENSORINEURAL (05/12/2006), HEMATOCHEZIA (04/18/2008), HIP PAIN, BILATERAL (07/17/2008), History of depression, History of kidney stones, HIV (human immunodeficiency virus infection) ([place]) (1991), HLD (hyperlipidemia) (11/19/2013), Human immunodeficiency virus (HIV) disease ([place]) (05/12/2006), CABG (09/03/2006), Hyperkalemia (03/23/2014), Hyperlipidemia, Hypertension, Hyponatremia (03/23/2014), INGUINAL LYMPHADENOPATHY, RIGHT (04/03/2009), Keratoma (01/24/2015), KNEE PAIN, BILATERAL (07/17/2008), Lesion of breast (07/21/2015), Lipodystrophy (12/20/2010), Memory loss (09/18/2008), Metatarsal deformity (01/24/2015), Nasal abscess (12/04/2015), Night sweats (07/06/2012), Nocturia (03/06/2013), NSTEMI (non-ST elevated myocardial infarction) ([place]) (11/29/2016), Pain in joint, ankle and foot (01/19/2015), Pancreatitis (11/2013), Pedal edema (05/21/2014), PERIPHERAL VASCULAR DISEASE (07/20/2008), Posterior cervical lymphadenopathy (03/30/2014),  Protein-calorie malnutrition, severe ([place]) (03/23/2014), SHINGLES, HX OF (05/03/2009), STEMI (ST elevation myocardial infarction) ([place]) (07/29/2015), Unstable angina ([place]) (11/24/2016), and WEIGHT LOSS, ABNORMAL (04/03/2009).   Diabetes - Followed with Endocrinology - Currently prescribed Metfomrin 500 mg XR,  Actos 15 mg, and Prandin 4 mg in the am, 2 mg at noon, and 4 mg in pm. Does report having a few episodes of hypoglycemia recently.  Lab Results  Component Value Date   HGBA1C 7.1 (H) 10/15/2017   Dyslipidemia - Cardiology added Fenofibrate 54 mg in addition to lipitor 20 mg, and vascepa He has follow up with Cardiology in a few weeks. Lab Results  Component Value Date   CHOL 140 10/21/2017   HDL 28 (L) 10/21/2017   LDLCALC 33 10/21/2017   LDLDIRECT 72.0 11/04/2016   TRIG 397 (H) 10/21/2017   CHOLHDL 5.0 10/21/2017   HIV - is followed by infectious disease - currently controlled with Tivicay and Pifeltro. Has follow up appointment next week.   Essential Hypertension -controlled with lisinopril 5 mg, metoprolol 25 mg and Lasix 20 mg BP Readings from Last 3 Encounters:  01/15/18 (!) 142/64  12/23/17 100/60  12/22/17 126/74    All immunizations and health maintenance protocols were reviewed with the patient and needed orders were placed. UTD   Appropriate screening laboratory values were ordered for the patient including screening of hyperlipidemia, renal function and hepatic function. If indicated by BPH, a PSA was ordered.  Medication reconciliation,  past medical history, social history, problem list and allergies were reviewed in detail with the patient  Goals were established with regard to weight loss, exercise, and  diet in compliance with medications. He has been trying to cut back on fried foods. He does walk but not as often as he believes he should ( does not feel as though it is safe to walk in the neighborhood he lives in). When he does go for a walk it is 2 miles at a  relaxed pace.   Interval History   Had an STEMI on 10/20/2017, echocardiogram obtained during that day showed a EF of 50 to 55%, mild MR, grade 1 diastolic dysfunction.  He subsequently underwent repeat cardiac cath on 10/21/2017 and underwent successful PCI of SVG and also a proximal portion of SVG to OM graft.  He was started on Plavix and daily 81 mg aspirin.  Is been following with cardiology as directed.  Today in the office denies any chest pain or shortness of breath. He takes lasix 20 mg for lower extremity swelling   Acute Issues -  Reports as of recently he is felt more depressed.  Per patient "I does not feel like myself".  He feels as though his depression is stemming from some independence due to not having a working automobile.  Reports that he has not had an car for the last year and that he has had to rely on the bus or other peoples cars for  transportation.  She reports that he feels as though his energy level is low only wants to do his sleep.  He has been putting off chores around the house such as cleaning and doing the dishes and has not planted any flowers light of his house ( an activity that he really enjoys).  He is hopeful that since his granddaughter, with whom he has a really good relationship with is now out of school he will build spend more time with her and take his mind off issues that are bothering him.  Review of Systems  Constitutional: Negative.   HENT: Negative.   Eyes: Negative.   Respiratory: Negative.   Cardiovascular: Positive for leg swelling.  Gastrointestinal: Negative.   Endocrine: Negative.   Genitourinary: Negative.   Musculoskeletal: Negative.   Skin: Negative.   Allergic/Immunologic: Negative.   Neurological: Negative.   Hematological: Negative.   Psychiatric/Behavioral: Negative.        Depression    All other systems reviewed and are negative.  Past Medical History:  Diagnosis Date  . Absolute anemia 05/28/2014  . Aortic stenosis  09/17/2015  . Arthritis   . Ascites 11/18/2013  . BELLS PALSY 07/19/2010   Qualifier: Diagnosis of  By: [name], [name]    . CAD (coronary artery disease)    a. s/p CABG in 2003 b. s/p PCI to SVG-PDA in 07/2015 c. 11/2016: cath showing severe native CAD with patent LIMA-LAD and SVG-D1 with 80% stenosis of SVG-OM1-OM2. Initially medical management was recommended --> presented with recurrent angina --> s/p Synergy DES to proximal body of SVG-OM1-OM2, POBA to distal graft.   Marland Kitchen CAD- S/P PCI SVG-OM1 12/01/16 05/12/2006   Qualifier: Diagnosis of  By: [name], [name]    . Carotid bruit 07/10/2015  . Chest pain 11/22/2016  . Chronic kidney disease   . Chronic renal insufficiency, stage III (moderate) ([place]) 12/02/2016  . Constipation 05/28/2014  . COPD 07/20/2008   Qualifier: Diagnosis of  By: [name], [name]   . DEPRESSION 09/03/2006   Qualifier: Diagnosis of  By: [name], [name]    . Dermatitis 11/27/2012  . Diabetes mellitus   . Diarrhea 11/20/2013  . DM (diabetes mellitus), type 2 with ophthalmic complications ([place]) 2/42/3536   Qualifier: Diagnosis of  By: [name], [name]    . Dry eye syndrome 12/20/2010  . Epicondylitis 06/05/2013   right  . Erectile dysfunction 01/01/2012  . Essential hypertension 05/12/2006   Qualifier: Diagnosis of  By: [name], [name]    . GENITAL HERPES 05/03/2009   Qualifier: Diagnosis of  By: [name], [name]    . GERD 09/03/2006   Qualifier: Diagnosis of  By: [name], [name]    .  GERD (gastroesophageal reflux disease)   . HEARING LOSS, SENSORINEURAL 05/12/2006   Qualifier: Diagnosis of  By: [name], [name]    . HEMATOCHEZIA 04/18/2008   Annotation: 9/09 during bout of constipation Qualifier: Diagnosis of  By: [name], [name]    . HIP PAIN, BILATERAL 07/17/2008   Qualifier: Diagnosis of  By: [name], [name]   . History of depression   . History of kidney stones   . HIV (human immunodeficiency virus infection) ([place]) 1991   on meds since initial dx.   Marland Kitchen  HLD (hyperlipidemia) 11/19/2013  . Human immunodeficiency virus (HIV) disease ([place]) 05/12/2006   Qualifier: Diagnosis of  By: [name], [name]    . Hx of CABG 09/03/2006   Annotation: 2003 Qualifier: Diagnosis of  By: [name], [name]    . Hyperkalemia 03/23/2014  . Hyperlipidemia   . Hypertension   . Hyponatremia 03/23/2014  . INGUINAL LYMPHADENOPATHY, RIGHT 04/03/2009   Qualifier: Diagnosis of  By: [name], [name]    . Keratoma 01/24/2015  . KNEE PAIN, BILATERAL 07/17/2008   Qualifier: Diagnosis of  By: [name], [name]   . Lesion of breast 07/21/2015  . Lipodystrophy 12/20/2010  . Memory loss 09/18/2008   Qualifier: Diagnosis of  By: [name], [name]   . Metatarsal deformity 01/24/2015  . Nasal abscess 12/04/2015  . Night sweats 07/06/2012  . Nocturia 03/06/2013  . NSTEMI (non-ST elevated myocardial infarction) ([place]) 11/29/2016  . Pain in joint, ankle and foot 01/19/2015  . Pancreatitis 11/2013   attributed to HIV meds.   . Pedal edema 05/21/2014  . PERIPHERAL VASCULAR DISEASE 07/20/2008   Qualifier: Diagnosis of  By: [name], [name]   . Posterior cervical lymphadenopathy 03/30/2014  . Protein-calorie malnutrition, severe ([place]) 03/23/2014  . SHINGLES, HX OF 05/03/2009   Annotation: R leg Qualifier: Diagnosis of  By: [name], [name]    . STEMI (ST elevation myocardial infarction) ([place]) 07/29/2015  . Unstable angina ([place]) 11/24/2016  . WEIGHT LOSS, ABNORMAL 04/03/2009   Qualifier: Diagnosis of  By: [name], [name]      Social History   Socioeconomic History  . Marital status: Divorced    Spouse name: Not on file  . Number of children: 4  . Years of education: Not on file  . Highest education level: Not on file  Occupational History  . 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   Social Needs  . Financial resource strain: Not on file  . Food insecurity:    Worry: Not on file    Inability: Not on file  . Transportation  needs:    Medical: Not on file    Non-medical: Not on file  Tobacco Use  . Smoking status: Never Smoker  . Smokeless tobacco: Never Used  Substance and Sexual Activity  . Alcohol use: Yes    Alcohol/week: 0.0 oz    Comment: rare  . Drug use: No  . Sexual activity: Not Currently    Comment: declined condoms  Lifestyle  . Physical activity:    Days per week: Not on file    Minutes per session: Not on file  . Stress: Not on file  Relationships  . Social connections:    Talks on phone: Not on file    Gets together: Not on file    Attends religious service: Not on file    Active member of club or organization: Not  on file    Attends meetings of clubs or organizations: Not on file    Relationship status: Not on file  . Intimate partner violence:    Fear of current or ex partner: Not on file    Emotionally abused: Not on file    Physically abused: Not on file    Forced sexual activity: Not on file  Other Topics Concern  . Not on file  Social History Narrative   Lives alone.  Supportive friends and family.  His HIV Dx is not a secret.     Past Surgical History:  Procedure Laterality Date  . CARDIAC CATHETERIZATION N/A 07/29/2015   Procedure: Left Heart Cath and Coronary Angiography;  Surgeon: [name], MD;  Location: [facility];  Service: Cardiovascular;  Laterality: N/A;  . CARDIAC CATHETERIZATION N/A 07/29/2015   Procedure: Coronary Stent Intervention;  Surgeon: [name], MD;  Location: [facility];  Service: Cardiovascular;  Laterality: N/A;  . CORONARY ARTERY BYPASS GRAFT  05/2002  . CORONARY STENT INTERVENTION N/A 12/01/2016   Procedure: Coronary Stent Intervention;  Surgeon: [name], MD;  Location: [facility];  Service: Cardiovascular;  Laterality: N/A;  . CORONARY STENT INTERVENTION N/A 10/21/2017   Procedure: CORONARY STENT INTERVENTION;  Surgeon: Jettie Booze, MD;  Location: [facility];  Service: Cardiovascular;   Laterality: N/A;  . LEFT HEART CATH AND CORS/GRAFTS ANGIOGRAPHY N/A 11/24/2016   Procedure: Left Heart Cath and Cors/Grafts Angiography;  Surgeon: Nelva Bush, MD;  Location: [facility];  Service: Cardiovascular;  Laterality: N/A;  . LEFT HEART CATH AND CORS/GRAFTS ANGIOGRAPHY N/A 12/01/2016   Procedure: Left Heart Cath and Cors/Grafts Angiography;  Surgeon: [name], MD;  Location: [facility];  Service: Cardiovascular;  Laterality: N/A;  . LEFT HEART CATH AND CORS/GRAFTS ANGIOGRAPHY N/A 10/21/2017   Procedure: LEFT HEART CATH AND CORS/GRAFTS ANGIOGRAPHY;  Surgeon: Jettie Booze, MD;  Location: [facility];  Service: Cardiovascular;  Laterality: N/A;  . VASECTOMY      Family History  Problem Relation Age of Onset  . Hyperlipidemia Mother   . Diabetes Mother        paternal grandparents/1 brother  . Hyperlipidemia Father   . Hypertension Father        paternal grandmother/3 brothers/1 sister  . Arthritis Unknown        mother/father/paternal grandparents  . Breast cancer Maternal Aunt        paternal aunt  . Lung cancer Maternal Aunt   . Heart disease Unknown        parents/maternal grandparents/ 2 brothers  . Stroke Paternal Grandmother   . Mental retardation Sister   . Colon cancer Neg Hx   . Esophageal cancer Neg Hx   . Pancreatic cancer Neg Hx   . Stomach cancer Neg Hx   . Liver disease Neg Hx     No Known Allergies  Current Outpatient Medications on File Prior to Visit  Medication Sig Dispense Refill  . acetaminophen (TYLENOL) 325 MG tablet Take 2 tablets (650 mg total) by mouth every 4 (four) hours as needed for headache or mild pain.    Marland Kitchen aspirin 81 MG chewable tablet Chew 81 mg by mouth daily.    Marland Kitchen atorvastatin (LIPITOR) 20 MG tablet Take 1 tablet (20 mg total) by mouth daily. 90 tablet 3  . b complex vitamins tablet Take 1 tablet by mouth daily. 30 tablet 11  .  benzonatate (TESSALON) 200 MG capsule Take 1 capsule (200 mg total)  by mouth 2 (two) times daily as needed for cough. 20 capsule 1  . Cholecalciferol 2000 units CAPS Take 1 capsule (2,000 Units total) by mouth daily. 30 each 11  . clopidogrel (PLAVIX) 75 MG tablet Take 1 tablet (75 mg total) by mouth daily. 30 tablet 11  . dolutegravir (TIVICAY) 50 MG tablet Take 1 tablet (50 mg total) by mouth daily. 30 tablet 11  . doravirine (PIFELTRO) 100 MG TABS tablet Take 1 tablet (100 mg total) by mouth daily. 30 tablet 11  . fenofibrate 54 MG tablet Take 1 tablet (54 mg total) by mouth daily. 90 tablet 3  . fluticasone (FLONASE) 50 MCG/ACT nasal spray Place 2 sprays into both nostrils daily. 16 g 6  . FREESTYLE TEST STRIPS test strip USE TO CHECK BLOOD SUGAR THREE TIMES DAILY 300 each 3  . furosemide (LASIX) 20 MG tablet TAKE 2 TABLETS BY MOUTH DAILY AS NEEDED FOR FLUID RETENTION OR SWELLING (Patient taking differently: TAKE 1 TABLETS BY MOUTH DAILY AS NEEDED FOR FLUID RETENTION OR SWELLING) 90 tablet 0  . Icosapent Ethyl (VASCEPA) 1 g CAPS Take 2 capsules (2 g total) by mouth 2 (two) times daily. 120 capsule 2  . isosorbide mononitrate (IMDUR) 30 MG 24 hr tablet TAKE 1 TABLET(30 MG) BY MOUTH DAILY (Patient taking differently: 15mg  by mouth twice daily) 90 tablet 2  . Lancets (FREESTYLE) lancets Use as directed three times a day to check blood sugar.  DX E11.9 100 each 5  . lisinopril (PRINIVIL,ZESTRIL) 5 MG tablet Take 1 tablet (5 mg total) by mouth daily. 90 tablet 3  . metFORMIN (GLUCOPHAGE-XR) 500 MG 24 hr tablet Take 500 mg by mouth daily with breakfast.    . metoprolol tartrate (LOPRESSOR) 25 MG tablet TAKE 1/2 TABLET BY MOUTH TWICE DAILY 60 tablet 11  . neomycin-polymyxin-hydrocortisone (CORTISPORIN) otic solution INSTILL 3 DROPS IN BOTH EARS THREE TIMES DAILY (Patient taking differently: 3 drops into both ears daily as needed) 10 mL 0  . nitroGLYCERIN (NITROSTAT) 0.4 MG SL tablet Place 1 tablet (0.4 mg total) under the tongue every 5 (five) minutes as needed for  chest pain. 25 tablet 3  . Omega-3 Fatty Acids (FISH OIL) 1000 MG CAPS Take 1 capsule by mouth 2 (two) times daily. Takes 2 daily     . ondansetron (ZOFRAN) 4 MG tablet Take 1 tablet (4 mg total) by mouth every 8 (eight) hours as needed for nausea or vomiting. 20 tablet 0  . pantoprazole (PROTONIX) 40 MG tablet Take 1 tablet (40 mg total) by mouth daily. 30 tablet 5  . pioglitazone (ACTOS) 15 MG tablet TAKE 1 TABLET(15 MG) BY MOUTH DAILY (Patient taking differently: 15mg  by mouth once daily) 30 tablet 0  . potassium chloride SA (K-DUR,KLOR-CON) 20 MEQ tablet TAKE 1 TABLET BY MOUTH THREE TIMES DAILY (Patient taking differently: 66meq by mouth three times daily) 270 tablet 0  . Probiotic Product (PROBIOTIC DAILY) CAPS Take 1 by mouth daily 30 capsule 11  . repaglinide (PRANDIN) 2 MG tablet TAKE 2 TABLETS BY MOUTH BEFORE BREAKFAST, 1 TABLET BY MOUTH BEFORE LUNCH AND 2 TABLETS BY MOUTH BEFORE SUPPER 450 tablet 0  . sodium bicarbonate 650 MG tablet Take 650 mg by mouth 3 (three) times daily.    Marland Kitchen triamcinolone cream (KENALOG) 0.1 % apply twice daily as needed to dry, itchy skin  2  . valACYclovir (VALTREX) 500 MG tablet TAKE 1 TABLET BY  MOUTH DAILY 30 tablet 2   No current facility-administered medications on file prior to visit.     BP (!) 142/64   Temp 97.7 F (36.5 C) (Oral)   Ht 5' 3.5" (1.613 m)   Wt 143 lb (64.9 kg)   BMI 24.93 kg/m       Objective:   Physical Exam  Constitutional: He is oriented to person, place, and time. He appears well-developed and well-nourished. No distress.  Overweight    HENT:  Head: Normocephalic and atraumatic.  Right Ear: External ear normal.  Left Ear: External ear normal.  Nose: Nose normal.  Mouth/Throat: Oropharynx is clear and moist. No oropharyngeal exudate.  Eyes: Pupils are equal, round, and reactive to light. Conjunctivae and EOM are normal. Right eye exhibits no discharge. Left eye exhibits no discharge. No scleral icterus.  Neck: Normal  range of motion. Neck supple. No JVD present. No tracheal deviation present. No thyromegaly present.  Cardiovascular: Normal rate, regular rhythm, normal heart sounds and intact distal pulses. Exam reveals no gallop and no friction rub.  No murmur heard. Pulmonary/Chest: Effort normal and breath sounds normal. No stridor. No respiratory distress. He has no wheezes. He has no rales. He exhibits no tenderness.  Abdominal: Soft. Bowel sounds are normal. He exhibits no distension and no mass. There is no tenderness. There is no rebound and no guarding. No hernia.  Musculoskeletal: Normal range of motion. He exhibits no edema, tenderness or deformity.  Lymphadenopathy:    He has no cervical adenopathy.  Neurological: He is alert and oriented to person, place, and time. He displays normal reflexes. No cranial nerve deficit or sensory deficit. He exhibits normal muscle tone. Coordination normal.  Skin: Skin is warm and dry. Capillary refill takes less than 2 seconds. No rash noted. He is not diaphoretic. No erythema. No pallor.  Psychiatric: He has a normal mood and affect. His behavior is normal. Judgment and thought content normal.  Nursing note and vitals reviewed.     Assessment & Plan:  He has multiple lab draws coming up in the near future.  We did not seem to think that it was necessary to draw blood today as these would be repeat labs.  Reviewed labs as they come in from his specialists  1. Type 2 diabetes mellitus with retinopathy without macular edema, unspecified laterality, unspecified retinopathy severity, unspecified whether long term insulin use ([place]) -Follow-up with endocrinology as directed.  Encouraged heart healthy diet and more frequent cardio exercise  2. Depression, recurrent ([place]) -Spoke about starting a medication.  He does not want to do this at this time.  He hopes that once he starts spending time with his granddaughter that his mood will improve.  I respect this decision and  advised to follow-up if his symptoms do not improve.  Of course he is to go to the emergency room if he has any suicidal ideation -PHQ 9 score 12  3. Mixed hyperlipidemia -Change in therapy.  Advised to follow-up with cardiology as directed.  Encouraged heart healthy diet and frequent exercise  4. Essential hypertension -Continue to monitor.  No change in medication  Dorothyann Peng, NP

## 2018-01-16 ENCOUNTER — Other Ambulatory Visit: Payer: Self-pay | Admitting: Endocrinology

## 2018-01-21 ENCOUNTER — Ambulatory Visit (INDEPENDENT_AMBULATORY_CARE_PROVIDER_SITE_OTHER): Payer: Medicare Other | Admitting: Internal Medicine

## 2018-01-21 ENCOUNTER — Ambulatory Visit (INDEPENDENT_AMBULATORY_CARE_PROVIDER_SITE_OTHER): Payer: Medicare Other | Admitting: Licensed Clinical Social Worker

## 2018-01-21 DIAGNOSIS — F339 Major depressive disorder, recurrent, unspecified: Secondary | ICD-10-CM

## 2018-01-21 DIAGNOSIS — B20 Human immunodeficiency virus [HIV] disease: Secondary | ICD-10-CM | POA: Diagnosis not present

## 2018-01-21 DIAGNOSIS — I25810 Atherosclerosis of coronary artery bypass graft(s) without angina pectoris: Secondary | ICD-10-CM | POA: Diagnosis not present

## 2018-01-21 DIAGNOSIS — N183 Chronic kidney disease, stage 3 unspecified: Secondary | ICD-10-CM

## 2018-01-21 DIAGNOSIS — F331 Major depressive disorder, recurrent, moderate: Secondary | ICD-10-CM | POA: Diagnosis not present

## 2018-01-21 NOTE — Assessment & Plan Note (Signed)
His depression has worsened recently.  I had him meet with our behavioral health counselor, Lillie Fragmin, today.

## 2018-01-21 NOTE — Assessment & Plan Note (Signed)
His chronic renal insufficiency is stable and unchanged.

## 2018-01-21 NOTE — BH Specialist Note (Signed)
Integrated Behavioral Health Initial Visit  MRN: [id number] Name: [name]  Number of [place] Clinician visits:: 1/6 Session Start time: 9:50am  Session End time: 10:07am Total time: 15 minutes  Type of Service: [place] Interpretor:No. Interpretor Name and Language: n/a   Warm Hand Off Completed.       SUBJECTIVE: [name] is a 72 y.o. male accompanied by young granddaughter Patient was referred by Dr. Megan Salon for history of depression/loss of independence. Patient reports the following symptoms/concerns: sadness and loss of energy, tired and frustrated with physical problem Duration of problem: a couple of months; Severity of problem: moderate  OBJECTIVE: Mood: Depressed and Affect: Blunt Risk of harm to self or others: No plan to harm self or others  LIFE CONTEXT: Patient currently has several physical/medical concerns. He prides himself on his independence, but is no longer able to drive due to a problem with his car. He states that between this and his physical problems he feels as though he is losing his independence. Patient reports that there are some days that he does not want to get out of bed, but that he forces himself to do so anyway. He is pretty active, spending time with family members and running errands.   GOALS ADDRESSED: Patient will: 1. Reduce symptoms of: depression  INTERVENTIONS: Interventions utilized: Motivational Interviewing and Supportive Counseling    ASSESSMENT: Patient currently experiencing lack of motivation and energy, some irritability, depressed mood some of the time, fatigue/hypersomnia, and excessive feelings of guilt. He reports this is not the first time he has dealt with these types of feelings. The diagnosis most consistent with his symptoms is Major Depressive Disorder, Recurrent, Moderate. Counselor commended patient on making himself follow through with activities  and tasks when he feels too depressed to do them and remaining active. Patient and counselor explored self-care techniques patient employs, not the least of which is taking all medication regularly. Counselor guided patient to identify what he wants to see change or what feeling better would be like. Counselor educated patient on counseling services available. Patient agreed to set an appointment for next week to begin focusing on moving toward these goals.    Patient may benefit from ongoing counseling using Motivational Interviewing and CBT.  PLAN: 1. Follow up with behavioral health clinician on : 01/27/18 @ [place], LCSW

## 2018-01-21 NOTE — Progress Notes (Signed)
Patient Active Problem List   Diagnosis Date Noted  . DM (diabetes mellitus), type 2 with ophthalmic complications ([place]) 39/76/7341    Priority: High  . Human immunodeficiency virus (HIV) disease ([place]) 05/12/2006    Priority: High  . CAD- S/P PCI SVG-OM1 12/01/16 05/12/2006    Priority: High  . Chronic renal insufficiency, stage III (moderate) ([place]) 12/02/2016  . NSTEMI (non-ST elevated myocardial infarction) ([place]) 11/29/2016  . Unstable angina ([place]) 11/24/2016  . Chest pain 11/22/2016  . Aortic stenosis 09/17/2015  . STEMI (ST elevation myocardial infarction) ([place]) 07/29/2015  . Lesion of breast 07/21/2015  . Carotid bruit 07/10/2015  . Metatarsal deformity 01/24/2015  . Keratoma 01/24/2015  . Pain in joint, ankle and foot 01/19/2015  . Absolute anemia 05/28/2014  . Constipation 05/28/2014  . Pedal edema 05/21/2014  . Posterior cervical lymphadenopathy 03/30/2014  . Hyperkalemia 03/23/2014  . Hyponatremia 03/23/2014  . Protein-calorie malnutrition, severe ([place]) 03/23/2014  . Diarrhea 11/20/2013  . HLD (hyperlipidemia) 11/19/2013  . Ascites 11/18/2013  . Pancreatitis 11/15/2013  . Epicondylitis 06/05/2013  . Nocturia 03/06/2013  . Dermatitis 11/27/2012  . Night sweats 07/06/2012  . Erectile dysfunction 01/01/2012  . Lipodystrophy 12/20/2010  . Dry eye syndrome 12/20/2010  . BELLS PALSY 07/19/2010  . GENITAL HERPES 05/03/2009  . SHINGLES, HX OF 05/03/2009  . WEIGHT LOSS, ABNORMAL 04/03/2009  . INGUINAL LYMPHADENOPATHY, RIGHT 04/03/2009  . MEMORY LOSS 09/18/2008  . PERIPHERAL VASCULAR DISEASE 07/20/2008  . COPD 07/20/2008  . HIP PAIN, BILATERAL 07/17/2008  . KNEE PAIN, BILATERAL 07/17/2008  . HEMATOCHEZIA 04/18/2008  . NEPHROLITHIASIS, HX OF 02/22/2008  . Depression, recurrent ([place]) 09/03/2006  . GERD 09/03/2006  . Hx of CABG 09/03/2006  . HEARING LOSS, SENSORINEURAL 05/12/2006  . Essential hypertension 05/12/2006    Patient's Medications  New  Prescriptions   No medications on file  Previous Medications   ACETAMINOPHEN (TYLENOL) 325 MG TABLET    Take 2 tablets (650 mg total) by mouth every 4 (four) hours as needed for headache or mild pain.   ASPIRIN 81 MG CHEWABLE TABLET    Chew 81 mg by mouth daily.   ATORVASTATIN (LIPITOR) 20 MG TABLET    Take 1 tablet (20 mg total) by mouth daily.   B COMPLEX VITAMINS TABLET    Take 1 tablet by mouth daily.   BENZONATATE (TESSALON) 200 MG CAPSULE    Take 1 capsule (200 mg total) by mouth 2 (two) times daily as needed for cough.   CHOLECALCIFEROL 2000 UNITS CAPS    Take 1 capsule (2,000 Units total) by mouth daily.   CLOPIDOGREL (PLAVIX) 75 MG TABLET    Take 1 tablet (75 mg total) by mouth daily.   DOLUTEGRAVIR (TIVICAY) 50 MG TABLET    Take 1 tablet (50 mg total) by mouth daily.   DORAVIRINE (PIFELTRO) 100 MG TABS TABLET    Take 1 tablet (100 mg total) by mouth daily.   FENOFIBRATE 54 MG TABLET    Take 1 tablet (54 mg total) by mouth daily.   FLUTICASONE (FLONASE) 50 MCG/ACT NASAL SPRAY    Place 2 sprays into both nostrils daily.   FREESTYLE TEST STRIPS TEST STRIP    USE TO CHECK BLOOD SUGAR THREE TIMES DAILY   FUROSEMIDE (LASIX) 20 MG TABLET    TAKE 2 TABLETS BY MOUTH DAILY AS NEEDED FOR FLUID RETENTION OR SWELLING   ICOSAPENT ETHYL (VASCEPA) 1 G CAPS    Take 2 capsules (  2 g total) by mouth 2 (two) times daily.   ISOSORBIDE MONONITRATE (IMDUR) 30 MG 24 HR TABLET    TAKE 1 TABLET(30 MG) BY MOUTH DAILY   LANCETS (FREESTYLE) LANCETS    Use as directed three times a day to check blood sugar.  DX E11.9   LISINOPRIL (PRINIVIL,ZESTRIL) 5 MG TABLET    Take 1 tablet (5 mg total) by mouth daily.   METFORMIN (GLUCOPHAGE-XR) 500 MG 24 HR TABLET    Take 500 mg by mouth daily with breakfast.   METOPROLOL TARTRATE (LOPRESSOR) 25 MG TABLET    TAKE 1/2 TABLET BY MOUTH TWICE DAILY   NEOMYCIN-POLYMYXIN-HYDROCORTISONE (CORTISPORIN) OTIC SOLUTION    INSTILL 3 DROPS IN BOTH EARS THREE TIMES DAILY   NITROGLYCERIN  (NITROSTAT) 0.4 MG SL TABLET    Place 1 tablet (0.4 mg total) under the tongue every 5 (five) minutes as needed for chest pain.   OMEGA-3 FATTY ACIDS (FISH OIL) 1000 MG CAPS    Take 1 capsule by mouth 2 (two) times daily. Takes 2 daily    ONDANSETRON (ZOFRAN) 4 MG TABLET    Take 1 tablet (4 mg total) by mouth every 8 (eight) hours as needed for nausea or vomiting.   PANTOPRAZOLE (PROTONIX) 40 MG TABLET    Take 1 tablet (40 mg total) by mouth daily.   PIOGLITAZONE (ACTOS) 15 MG TABLET    TAKE 1 TABLET(15 MG) BY MOUTH DAILY   POTASSIUM CHLORIDE SA (K-DUR,KLOR-CON) 20 MEQ TABLET    TAKE 1 TABLET BY MOUTH THREE TIMES DAILY   PROBIOTIC PRODUCT (PROBIOTIC DAILY) CAPS    Take 1 by mouth daily   REPAGLINIDE (PRANDIN) 2 MG TABLET    TAKE 2 TABLETS BY MOUTH BEFORE BREAKFAST, 1 TABLET BY MOUTH BEFORE LUNCH AND 2 TABLETS BY MOUTH BEFORE SUPPER   SODIUM BICARBONATE 650 MG TABLET    Take 650 mg by mouth 3 (three) times daily.   TRIAMCINOLONE CREAM (KENALOG) 0.1 %    apply twice daily as needed to dry, itchy skin   VALACYCLOVIR (VALTREX) 500 MG TABLET    TAKE 1 TABLET BY MOUTH DAILY  Modified Medications   No medications on file  Discontinued Medications   No medications on file    Subjective: [name] is in for his routine HIV follow-up visit.  He recently was started on some new medications that had drug drug interactions with his previous antiretroviral regimen.  He was switched to a new regimen consisting of doravarine and dolutegravir.  He has had no problems obtaining, taking or tolerating his new medications.  As usual he never misses any doses.  He is using his pillbox.  He says that he has been feeling more down and depressed recently.  The engine died in his car he has been having problems with transportation.  He says that he feels like he is "losing his independence".  He had one episode of chest pain during the night recently while he was in bed.  It resolved promptly with one sublingual nitroglycerin.   He has not been walking as much as he normally does.  Review of Systems: Review of Systems  Constitutional: Positive for malaise/fatigue. Negative for weight loss.  Respiratory: Negative for cough, sputum production and shortness of breath.   Cardiovascular: Positive for chest pain.    Past Medical History:  Diagnosis Date  . Absolute anemia 05/28/2014  . Aortic stenosis 09/17/2015  . Arthritis   . Ascites 11/18/2013  . BELLS PALSY 07/19/2010  Qualifier: Diagnosis of  By: [name], [name]    . CAD (coronary artery disease)    a. s/p CABG in 2003 b. s/p PCI to SVG-PDA in 07/2015 c. 11/2016: cath showing severe native CAD with patent LIMA-LAD and SVG-D1 with 80% stenosis of SVG-OM1-OM2. Initially medical management was recommended --> presented with recurrent angina --> s/p Synergy DES to proximal body of SVG-OM1-OM2, POBA to distal graft.   Marland Kitchen CAD- S/P PCI SVG-OM1 12/01/16 05/12/2006   Qualifier: Diagnosis of  By: [name], Bradly Sangiovanni    . Carotid bruit 07/10/2015  . Chest pain 11/22/2016  . Chronic kidney disease   . Chronic renal insufficiency, stage III (moderate) ([place]) 12/02/2016  . Constipation 05/28/2014  . COPD 07/20/2008   Qualifier: Diagnosis of  By: [name], [name]   . DEPRESSION 09/03/2006   Qualifier: Diagnosis of  By: [name], Kissie Ziolkowski    . Dermatitis 11/27/2012  . Diabetes mellitus   . Diarrhea 11/20/2013  . DM (diabetes mellitus), type 2 with ophthalmic complications ([place]) 6/57/8469   Qualifier: Diagnosis of  By: [name], Osmara Drummonds    . Dry eye syndrome 12/20/2010  . Epicondylitis 06/05/2013   right  . Erectile dysfunction 01/01/2012  . Essential hypertension 05/12/2006   Qualifier: Diagnosis of  By: [name], [place]    . GENITAL HERPES 05/03/2009   Qualifier: Diagnosis of  By: [name], Makhari Dovidio    . GERD 09/03/2006   Qualifier: Diagnosis of  By: [name], Naleah Kofoed    . GERD (gastroesophageal reflux disease)   . HEARING LOSS, SENSORINEURAL 05/12/2006   Qualifier: Diagnosis of   By: [name], Raina Sole    . HEMATOCHEZIA 04/18/2008   Annotation: 9/09 during bout of constipation Qualifier: Diagnosis of  By: [name], Kelina Beauchamp    . HIP PAIN, BILATERAL 07/17/2008   Qualifier: Diagnosis of  By: [name], [name]   . History of depression   . History of kidney stones   . HIV (human immunodeficiency virus infection) ([place]) 1991   on meds since initial dx.   Marland Kitchen HLD (hyperlipidemia) 11/19/2013  . Human immunodeficiency virus (HIV) disease ([place]) 05/12/2006   Qualifier: Diagnosis of  By: [name], Chanel Mckesson    . Hx of CABG 09/03/2006   Annotation: 2003 Qualifier: Diagnosis of  By: [name], Alexismarie Flaim    . Hyperkalemia 03/23/2014  . Hyperlipidemia   . Hypertension   . Hyponatremia 03/23/2014  . INGUINAL LYMPHADENOPATHY, RIGHT 04/03/2009   Qualifier: Diagnosis of  By: [name], Abeeha Twist    . Keratoma 01/24/2015  . KNEE PAIN, BILATERAL 07/17/2008   Qualifier: Diagnosis of  By: [name], [name]   . Lesion of breast 07/21/2015  . Lipodystrophy 12/20/2010  . Memory loss 09/18/2008   Qualifier: Diagnosis of  By: [name], [name]   . Metatarsal deformity 01/24/2015  . Nasal abscess 12/04/2015  . Night sweats 07/06/2012  . Nocturia 03/06/2013  . NSTEMI (non-ST elevated myocardial infarction) ([place]) 11/29/2016  . Pain in joint, ankle and foot 01/19/2015  . Pancreatitis 11/2013   attributed to HIV meds.   . Pedal edema 05/21/2014  . PERIPHERAL VASCULAR DISEASE 07/20/2008   Qualifier: Diagnosis of  By: [name], [name]   . Posterior cervical lymphadenopathy 03/30/2014  . Protein-calorie malnutrition, severe ([place]) 03/23/2014  . SHINGLES, HX OF 05/03/2009   Annotation: R leg Qualifier: Diagnosis of  By: [name], Jakory Matsuo    . STEMI (ST elevation myocardial infarction) ([place]) 07/29/2015  .  Unstable angina ([place]) 11/24/2016  . WEIGHT LOSS, ABNORMAL 04/03/2009   Qualifier: Diagnosis of  By: [name], Heloise Gordan      Social History   Tobacco Use  . Smoking status: Never Smoker  . Smokeless tobacco: Never  Used  Substance Use Topics  . Alcohol use: Yes    Alcohol/week: 0.0 oz    Comment: rare  . Drug use: No    Family History  Problem Relation Age of Onset  . Hyperlipidemia Mother   . Diabetes Mother        paternal grandparents/1 brother  . Hyperlipidemia Father   . Hypertension Father        paternal grandmother/3 brothers/1 sister  . Arthritis Unknown        mother/father/paternal grandparents  . Breast cancer Maternal Aunt        paternal aunt  . Lung cancer Maternal Aunt   . Heart disease Unknown        parents/maternal grandparents/ 2 brothers  . Stroke Paternal Grandmother   . Mental retardation Sister   . Colon cancer Neg Hx   . Esophageal cancer Neg Hx   . Pancreatic cancer Neg Hx   . Stomach cancer Neg Hx   . Liver disease Neg Hx     No Known Allergies  Health Maintenance  Topic Date Due  . OPHTHALMOLOGY EXAM  05/04/2017  . COLONOSCOPY  08/03/2018 (Originally 07/21/2013)  . INFLUENZA VACCINE  03/04/2018  . HEMOGLOBIN A1C  04/17/2018  . FOOT EXAM  12/24/2018  . TETANUS/TDAP  12/03/2025  . Hepatitis C Screening  Completed  . PNA vac Low Risk Adult  Completed    Objective:  Vitals:   01/21/18 0942  BP: (!) 147/80  Pulse: 60  Temp: 97.7 F (36.5 C)  TempSrc: Oral  Weight: 144 lb (65.3 kg)   Body mass index is 25.11 kg/m.  Physical Exam  Constitutional: He is oriented to person, place, and time.  His weight is up 10 pounds over the past year.  He is accompanied by his granddaughter.  HENT:  Mouth/Throat: No oropharyngeal exudate.  Cardiovascular: Normal rate, regular rhythm and normal heart sounds.  No murmur heard. Pulmonary/Chest: Effort normal and breath sounds normal.  Abdominal: Soft. He exhibits no distension. There is no tenderness.  Musculoskeletal: Normal range of motion.  Neurological: He is alert and oriented to person, place, and time.  Skin: No rash noted.  Psychiatric: He has a normal mood and affect.    Lab Results Lab  Results  Component Value Date   WBC 3.4 (L) 01/07/2018   HGB 13.3 01/07/2018   HCT 39.0 01/07/2018   MCV 94.2 01/07/2018   PLT 163 01/07/2018    Lab Results  Component Value Date   CREATININE 1.67 (H) 01/07/2018   BUN 24 01/07/2018   NA 143 01/07/2018   K 5.0 01/07/2018   CL 111 (H) 01/07/2018   CO2 24 01/07/2018    Lab Results  Component Value Date   ALT 27 01/07/2018   AST 21 01/07/2018   ALKPHOS 45 12/18/2017   BILITOT 0.4 01/07/2018    Lab Results  Component Value Date   CHOL 140 10/21/2017   HDL 28 (L) 10/21/2017   LDLCALC 33 10/21/2017   LDLDIRECT 72.0 11/04/2016   TRIG 397 (H) 10/21/2017   CHOLHDL 5.0 10/21/2017   Lab Results  Component Value Date   LABRPR NON-REACTIVE 01/07/2018   HIV 1 RNA Quant (copies/mL)  Date Value  01/07/2018 <20 NOT  DETECTED  07/09/2017 <20 DETECTED (A)  01/05/2017 <20 DETECTED (A)   HIV-1 RNA Viral Load (no units)  Date Value  07/14/2013 <40  04/13/2013 <40  01/18/2013 <40   CD4 (no units)  Date Value  07/14/2013 514  04/13/2013 498  01/18/2013 522   CD4 T Cell Abs (/uL)  Date Value  01/07/2018 320 (L)  07/09/2017 490  01/05/2017 370 (L)     Problem List Items Addressed This Visit      High   Human immunodeficiency virus (HIV) disease ([place])    His infection remains under excellent, long-term control on his new antiretroviral regimen.  He will follow-up after lab work in 6 months.      Relevant Orders   T-helper cell (CD4)- ([facility] clinic only)   HIV 1 RNA quant-no reflex-bld     Unprioritized   Chronic renal insufficiency, stage III (moderate) ([facility])    His chronic renal insufficiency is stable and unchanged.      Depression, recurrent ([place])    His depression has worsened recently.  I had him meet with our behavioral health counselor, Lillie Fragmin, today.           Michel Bickers, MD [facility] [place] [facility] [phone] pager   [phone]  cell 01/21/2018, 10:15 AM

## 2018-01-21 NOTE — Assessment & Plan Note (Signed)
His infection remains under excellent, long-term control on his new antiretroviral regimen.  He will follow-up after lab work in 6 months.

## 2018-01-22 ENCOUNTER — Encounter: Payer: Self-pay | Admitting: Adult Health

## 2018-01-22 ENCOUNTER — Other Ambulatory Visit: Payer: Self-pay | Admitting: Adult Health

## 2018-01-22 MED ORDER — POTASSIUM CHLORIDE CRYS ER 20 MEQ PO TBCR: 20.0000 meq | EXTENDED_RELEASE_TABLET | Freq: Three times a day (TID) | ORAL | 3 refills | Status: DC

## 2018-01-25 NOTE — Progress Notes (Deleted)
HPI: FU coronary artery disease. Patient is status post coronary artery bypass and graft in 2003. Carotid Dopplers December 2016 showed 1-39% bilateral stenosis. Patient had cardiac catheterization April 2018. He had severe three-vessel coronary disease with patent LIMA to the LAD, patent saphenous vein graft to the diagonal with 80-90% stenosis in the diagonal distal to the anastomosis, patent saphenous vein graft to the first and second marginal with 80-90% stenosis in the jump graft immediately distal to the OM1 anastomosis and occluded saphenous vein graft to the PDA. Patient subsequently had successful PCI of the sequential graft to the ramus and OM branches. Had NSTEMI 3/19. Echo showed EF 50-55, grade 1 DD, mild MR. Had PCI of SVG to diagonal and SVG to the to OM jump graft. Since last seen,   Current Outpatient Medications  Medication Sig Dispense Refill  . acetaminophen (TYLENOL) 325 MG tablet Take 2 tablets (650 mg total) by mouth every 4 (four) hours as needed for headache or mild pain.    Marland Kitchen aspirin 81 MG chewable tablet Chew 81 mg by mouth daily.    Marland Kitchen atorvastatin (LIPITOR) 20 MG tablet Take 1 tablet (20 mg total) by mouth daily. 90 tablet 3  . b complex vitamins tablet Take 1 tablet by mouth daily. 30 tablet 11  . benzonatate (TESSALON) 200 MG capsule Take 1 capsule (200 mg total) by mouth 2 (two) times daily as needed for cough. 20 capsule 1  . Cholecalciferol 2000 units CAPS Take 1 capsule (2,000 Units total) by mouth daily. 30 each 11  . clopidogrel (PLAVIX) 75 MG tablet Take 1 tablet (75 mg total) by mouth daily. 30 tablet 11  . dolutegravir (TIVICAY) 50 MG tablet Take 1 tablet (50 mg total) by mouth daily. 30 tablet 11  . doravirine (PIFELTRO) 100 MG TABS tablet Take 1 tablet (100 mg total) by mouth daily. 30 tablet 11  . fenofibrate 54 MG tablet Take 1 tablet (54 mg total) by mouth daily. 90 tablet 3  . fluticasone (FLONASE) 50 MCG/ACT nasal spray Place 2 sprays into both  nostrils daily. 16 g 6  . FREESTYLE TEST STRIPS test strip USE TO CHECK BLOOD SUGAR THREE TIMES DAILY 300 each 3  . furosemide (LASIX) 20 MG tablet TAKE 2 TABLETS BY MOUTH DAILY AS NEEDED FOR FLUID RETENTION OR SWELLING (Patient taking differently: TAKE 1 TABLETS BY MOUTH DAILY AS NEEDED FOR FLUID RETENTION OR SWELLING) 90 tablet 0  . Icosapent Ethyl (VASCEPA) 1 g CAPS Take 2 capsules (2 g total) by mouth 2 (two) times daily. 120 capsule 2  . isosorbide mononitrate (IMDUR) 30 MG 24 hr tablet TAKE 1 TABLET(30 MG) BY MOUTH DAILY (Patient taking differently: 15mg  by mouth twice daily) 90 tablet 2  . Lancets (FREESTYLE) lancets Use as directed three times a day to check blood sugar.  DX E11.9 100 each 5  . lisinopril (PRINIVIL,ZESTRIL) 5 MG tablet Take 1 tablet (5 mg total) by mouth daily. 90 tablet 3  . metFORMIN (GLUCOPHAGE-XR) 500 MG 24 hr tablet Take 500 mg by mouth daily with breakfast.    . metoprolol tartrate (LOPRESSOR) 25 MG tablet TAKE 1/2 TABLET BY MOUTH TWICE DAILY 60 tablet 11  . neomycin-polymyxin-hydrocortisone (CORTISPORIN) otic solution INSTILL 3 DROPS IN BOTH EARS THREE TIMES DAILY (Patient taking differently: 3 drops into both ears daily as needed) 10 mL 0  . nitroGLYCERIN (NITROSTAT) 0.4 MG SL tablet Place 1 tablet (0.4 mg total) under the tongue every 5 (five) minutes  as needed for chest pain. 25 tablet 3  . Omega-3 Fatty Acids (FISH OIL) 1000 MG CAPS Take 1 capsule by mouth 2 (two) times daily. Takes 2 daily     . ondansetron (ZOFRAN) 4 MG tablet Take 1 tablet (4 mg total) by mouth every 8 (eight) hours as needed for nausea or vomiting. 20 tablet 0  . pantoprazole (PROTONIX) 40 MG tablet Take 1 tablet (40 mg total) by mouth daily. 30 tablet 5  . pioglitazone (ACTOS) 15 MG tablet TAKE 1 TABLET(15 MG) BY MOUTH DAILY 30 tablet 3  . potassium chloride SA (K-DUR,KLOR-CON) 20 MEQ tablet Take 1 tablet (20 mEq total) by mouth 3 (three) times daily. 270 tablet 3  . Probiotic Product  (PROBIOTIC DAILY) CAPS Take 1 by mouth daily 30 capsule 11  . repaglinide (PRANDIN) 2 MG tablet TAKE 2 TABLETS BY MOUTH BEFORE BREAKFAST, 1 TABLET BY MOUTH BEFORE LUNCH AND 2 TABLETS BY MOUTH BEFORE SUPPER 450 tablet 0  . sodium bicarbonate 650 MG tablet Take 650 mg by mouth 3 (three) times daily.    Marland Kitchen triamcinolone cream (KENALOG) 0.1 % apply twice daily as needed to dry, itchy skin  2  . valACYclovir (VALTREX) 500 MG tablet TAKE 1 TABLET BY MOUTH DAILY 30 tablet 2   No current facility-administered medications for this visit.      Past Medical History:  Diagnosis Date  . Absolute anemia 05/28/2014  . Aortic stenosis 09/17/2015  . Arthritis   . Ascites 11/18/2013  . BELLS PALSY 07/19/2010   Qualifier: Diagnosis of  By: [name], [name]    . CAD (coronary artery disease)    a. s/p CABG in 2003 b. s/p PCI to SVG-PDA in 07/2015 c. 11/2016: cath showing severe native CAD with patent LIMA-LAD and SVG-D1 with 80% stenosis of SVG-OM1-OM2. Initially medical management was recommended --> presented with recurrent angina --> s/p Synergy DES to proximal body of SVG-OM1-OM2, POBA to distal graft.   Marland Kitchen CAD- S/P PCI SVG-OM1 12/01/16 05/12/2006   Qualifier: Diagnosis of  By: [name], [name]    . Carotid bruit 07/10/2015  . Chest pain 11/22/2016  . Chronic kidney disease   . Chronic renal insufficiency, stage III (moderate) ([place]) 12/02/2016  . Constipation 05/28/2014  . COPD 07/20/2008   Qualifier: Diagnosis of  By: [name], [name]   . DEPRESSION 09/03/2006   Qualifier: Diagnosis of  By: [name], [name]    . Dermatitis 11/27/2012  . Diabetes mellitus   . Diarrhea 11/20/2013  . DM (diabetes mellitus), type 2 with ophthalmic complications ([place]) 0/63/0160   Qualifier: Diagnosis of  By: [name], [name]    . Dry eye syndrome 12/20/2010  . Epicondylitis 06/05/2013   right  . Erectile dysfunction 01/01/2012  . Essential hypertension 05/12/2006   Qualifier: Diagnosis of  By: [name], [name]    . GENITAL  HERPES 05/03/2009   Qualifier: Diagnosis of  By: [name], [name]    . GERD 09/03/2006   Qualifier: Diagnosis of  By: [name], [name]    . GERD (gastroesophageal reflux disease)   . HEARING LOSS, SENSORINEURAL 05/12/2006   Qualifier: Diagnosis of  By: [name], [name]    . HEMATOCHEZIA 04/18/2008   Annotation: 9/09 during bout of constipation Qualifier: Diagnosis of  By: [name], [name]    . HIP PAIN, BILATERAL 07/17/2008   Qualifier: Diagnosis of  By: [name], [name]   . History of depression   . History of kidney  stones   . HIV (human immunodeficiency virus infection) ([place]) 1991   on meds since initial dx.   Marland Kitchen HLD (hyperlipidemia) 11/19/2013  . Human immunodeficiency virus (HIV) disease ([place]) 05/12/2006   Qualifier: Diagnosis of  By: [name], [name]    . Hx of CABG 09/03/2006   Annotation: 2003 Qualifier: Diagnosis of  By: [name], [name]    . Hyperkalemia 03/23/2014  . Hyperlipidemia   . Hypertension   . Hyponatremia 03/23/2014  . INGUINAL LYMPHADENOPATHY, RIGHT 04/03/2009   Qualifier: Diagnosis of  By: [name], [name]    . Keratoma 01/24/2015  . KNEE PAIN, BILATERAL 07/17/2008   Qualifier: Diagnosis of  By: [name], [name]   . Lesion of breast 07/21/2015  . Lipodystrophy 12/20/2010  . Memory loss 09/18/2008   Qualifier: Diagnosis of  By: [name], [name]   . Metatarsal deformity 01/24/2015  . Nasal abscess 12/04/2015  . Night sweats 07/06/2012  . Nocturia 03/06/2013  . NSTEMI (non-ST elevated myocardial infarction) ([place]) 11/29/2016  . Pain in joint, ankle and foot 01/19/2015  . Pancreatitis 11/2013   attributed to HIV meds.   . Pedal edema 05/21/2014  . PERIPHERAL VASCULAR DISEASE 07/20/2008   Qualifier: Diagnosis of  By: [name], [name]   . Posterior cervical lymphadenopathy 03/30/2014  . Protein-calorie malnutrition, severe ([place]) 03/23/2014  . SHINGLES, HX OF 05/03/2009   Annotation: R leg Qualifier: Diagnosis of  By: [name], [name]    . STEMI (ST elevation myocardial  infarction) ([place]) 07/29/2015  . Unstable angina ([address]) 11/24/2016  . WEIGHT LOSS, ABNORMAL 04/03/2009   Qualifier: Diagnosis of  By: [name], [name]      Past Surgical History:  Procedure Laterality Date  . CARDIAC CATHETERIZATION N/A 07/29/2015   Procedure: Left Heart Cath and Coronary Angiography;  Surgeon: [name], MD;  Location: [facility];  Service: Cardiovascular;  Laterality: N/A;  . CARDIAC CATHETERIZATION N/A 07/29/2015   Procedure: Coronary Stent Intervention;  Surgeon: [name], MD;  Location: [facility];  Service: Cardiovascular;  Laterality: N/A;  . CORONARY ARTERY BYPASS GRAFT  05/2002  . CORONARY STENT INTERVENTION N/A 12/01/2016   Procedure: Coronary Stent Intervention;  Surgeon: [name], MD;  Location: [facility];  Service: Cardiovascular;  Laterality: N/A;  . CORONARY STENT INTERVENTION N/A 10/21/2017   Procedure: CORONARY STENT INTERVENTION;  Surgeon: Jettie Booze, MD;  Location: [facility];  Service: Cardiovascular;  Laterality: N/A;  . LEFT HEART CATH AND CORS/GRAFTS ANGIOGRAPHY N/A 11/24/2016   Procedure: Left Heart Cath and Cors/Grafts Angiography;  Surgeon: Nelva Bush, MD;  Location: [facility];  Service: Cardiovascular;  Laterality: N/A;  . LEFT HEART CATH AND CORS/GRAFTS ANGIOGRAPHY N/A 12/01/2016   Procedure: Left Heart Cath and Cors/Grafts Angiography;  Surgeon: [name], MD;  Location: [facility];  Service: Cardiovascular;  Laterality: N/A;  . LEFT HEART CATH AND CORS/GRAFTS ANGIOGRAPHY N/A 10/21/2017   Procedure: LEFT HEART CATH AND CORS/GRAFTS ANGIOGRAPHY;  Surgeon: Jettie Booze, MD;  Location: [facility];  Service: Cardiovascular;  Laterality: N/A;  . VASECTOMY      Social History   Socioeconomic History  . Marital status: Divorced    Spouse name: Not on file  . Number of children: 4  . Years of education: Not on file  . Highest education level: Not on  file  Occupational History  . 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   Social Needs  . Financial resource strain: Not on file  . Food insecurity:    Worry: Not on file    Inability: Not on file  . Transportation needs:    Medical: Not on file    Non-medical: Not on file  Tobacco Use  . Smoking status: Never Smoker  . Smokeless tobacco: Never Used  Substance and Sexual Activity  . Alcohol use: Yes    Alcohol/week: 0.0 oz    Comment: rare  . Drug use: No  . Sexual activity: Not Currently    Comment: declined condoms  Lifestyle  . Physical activity:    Days per week: Not on file    Minutes per session: Not on file  . Stress: Not on file  Relationships  . Social connections:    Talks on phone: Not on file    Gets together: Not on file    Attends religious service: Not on file    Active member of club or organization: Not on file    Attends meetings of clubs or organizations: Not on file    Relationship status: Not on file  . Intimate partner violence:    Fear of current or ex partner: Not on file    Emotionally abused: Not on file    Physically abused: Not on file    Forced sexual activity: Not on file  Other Topics Concern  . Not on file  Social History Narrative   Lives alone.  Supportive friends and family.  His HIV Dx is not a secret.     Family History  Problem Relation Age of Onset  . Hyperlipidemia Mother   . Diabetes Mother        paternal grandparents/1 brother  . Hyperlipidemia Father   . Hypertension Father        paternal grandmother/3 brothers/1 sister  . Arthritis Unknown        mother/father/paternal grandparents  . Breast cancer Maternal Aunt        paternal aunt  . Lung cancer Maternal Aunt   . Heart disease Unknown        parents/maternal grandparents/ 2 brothers  . Stroke Paternal Grandmother   . Mental retardation Sister   . Colon cancer Neg Hx   . Esophageal cancer Neg Hx   .  Pancreatic cancer Neg Hx   . Stomach cancer Neg Hx   . Liver disease Neg Hx     ROS: no fevers or chills, productive cough, hemoptysis, dysphasia, odynophagia, melena, hematochezia, dysuria, hematuria, rash, seizure activity, orthopnea, PND, pedal edema, claudication. Remaining systems are negative.  Physical Exam: Well-developed well-nourished in no acute distress.  Skin is warm and dry.  HEENT is normal.  Neck is supple.  Chest is clear to auscultation with normal expansion.  Cardiovascular exam is regular rate and rhythm.  Abdominal exam nontender or distended. No masses palpated. Extremities show no edema. neuro grossly intact  ECG- personally reviewed  A/P  1  Christian Ruths, MD

## 2018-01-27 ENCOUNTER — Ambulatory Visit (INDEPENDENT_AMBULATORY_CARE_PROVIDER_SITE_OTHER): Payer: Medicare Other | Admitting: Licensed Clinical Social Worker

## 2018-01-27 DIAGNOSIS — F331 Major depressive disorder, recurrent, moderate: Secondary | ICD-10-CM | POA: Diagnosis not present
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
## 2018-01-28 DIAGNOSIS — Z955 Presence of coronary angioplasty implant and graft: Secondary | ICD-10-CM | POA: Diagnosis not present

## 2018-01-28 DIAGNOSIS — E785 Hyperlipidemia, unspecified: Secondary | ICD-10-CM | POA: Diagnosis not present

## 2018-01-28 DIAGNOSIS — I2510 Atherosclerotic heart disease of native coronary artery without angina pectoris: Secondary | ICD-10-CM | POA: Diagnosis not present

## 2018-01-29 LAB — HEPATIC FUNCTION PANEL
ALBUMIN: 4.3 g/dL (ref 3.6–4.8)
ALT: 32 IU/L (ref 0–44)
AST: 22 IU/L (ref 0–40)
Alkaline Phosphatase: 43 IU/L (ref 39–117)
BILIRUBIN TOTAL: 0.4 mg/dL (ref 0.0–1.2)
BILIRUBIN, DIRECT: 0.11 mg/dL (ref 0.00–0.40)
TOTAL PROTEIN: 6.7 g/dL (ref 6.0–8.5)

## 2018-01-29 LAB — LIPID PANEL
CHOL/HDL RATIO: 3.8 ratio (ref 0.0–5.0)
Cholesterol, Total: 122 mg/dL (ref 100–199)
HDL: 32 mg/dL — ABNORMAL LOW (ref >39)
LDL Calculated: 72 mg/dL (ref 0–99)
Triglycerides: 90 mg/dL (ref 0–149)
VLDL Cholesterol Cal: 18 mg/dL (ref 5–40)

## 2018-02-05 ENCOUNTER — Ambulatory Visit: Payer: Medicare Other | Admitting: Cardiology

## 2018-02-09 ENCOUNTER — Ambulatory Visit: Payer: Medicare Other | Admitting: Licensed Clinical Social Worker

## 2018-02-09 ENCOUNTER — Ambulatory Visit: Payer: Medicare Other

## 2018-02-12 ENCOUNTER — Encounter: Payer: Self-pay | Admitting: Internal Medicine

## 2018-02-12 ENCOUNTER — Other Ambulatory Visit: Payer: Self-pay | Admitting: Internal Medicine

## 2018-02-12 DIAGNOSIS — B029 Zoster without complications: Secondary | ICD-10-CM

## 2018-02-15 ENCOUNTER — Encounter (HOSPITAL_COMMUNITY): Payer: Self-pay | Admitting: Emergency Medicine

## 2018-02-15 ENCOUNTER — Ambulatory Visit (HOSPITAL_COMMUNITY)
Admission: EM | Admit: 2018-02-15 | Discharge: 2018-02-15 | Disposition: A | Discharge status: Home / Self Care | Payer: Medicare Other | Attending: Family Medicine | Admitting: Family Medicine

## 2018-02-15 ENCOUNTER — Ambulatory Visit (INDEPENDENT_AMBULATORY_CARE_PROVIDER_SITE_OTHER): Payer: Medicare Other

## 2018-02-15 ENCOUNTER — Ambulatory Visit (HOSPITAL_COMMUNITY): Payer: Medicare Other

## 2018-02-15 DIAGNOSIS — M545 Low back pain: Secondary | ICD-10-CM | POA: Diagnosis not present

## 2018-02-15 DIAGNOSIS — M47816 Spondylosis without myelopathy or radiculopathy, lumbar region: Secondary | ICD-10-CM

## 2018-02-15 DIAGNOSIS — S161XXA Strain of muscle, fascia and tendon at neck level, initial encounter: Secondary | ICD-10-CM

## 2018-02-15 DIAGNOSIS — S3992XA Unspecified injury of lower back, initial encounter: Secondary | ICD-10-CM | POA: Diagnosis not present

## 2018-02-15 DIAGNOSIS — M4696 Unspecified inflammatory spondylopathy, lumbar region: Secondary | ICD-10-CM | POA: Diagnosis not present

## 2018-02-15 DIAGNOSIS — S39012A Strain of muscle, fascia and tendon of lower back, initial encounter: Secondary | ICD-10-CM

## 2018-02-15 DIAGNOSIS — S199XXA Unspecified injury of neck, initial encounter: Secondary | ICD-10-CM | POA: Diagnosis not present

## 2018-02-15 DIAGNOSIS — M47812 Spondylosis without myelopathy or radiculopathy, cervical region: Secondary | ICD-10-CM

## 2018-02-15 DIAGNOSIS — M542 Cervicalgia: Secondary | ICD-10-CM | POA: Diagnosis not present

## 2018-02-15 MED ORDER — HYDROCODONE-ACETAMINOPHEN 5-325 MG PO TABS: 1.0000 | ORAL_TABLET | ORAL | 0 refills | Status: DC | PRN

## 2018-02-15 MED ORDER — PREDNISONE 20 MG PO TABS: 20.0000 mg | ORAL_TABLET | Freq: Two times a day (BID) | ORAL | 0 refills | Status: DC

## 2018-02-15 MED ORDER — CYCLOBENZAPRINE HCL 5 MG PO TABS: 5.0000 mg | ORAL_TABLET | Freq: Three times a day (TID) | ORAL | 0 refills | Status: DC | PRN

## 2018-02-15 NOTE — Discharge Instructions (Addendum)
Limit activity.  Avoid bending and lifting. Ice to painful areas. Take prednisone twice a day for 5 days.  Take the cyclobenzaprine and the pain medicine as needed.  These can cause drowsiness.  Please be careful.  Follow-up with your PCP or orthopedist if not better in several days.  Return right away if you feel worse instead of better, have numbness or weakness in your arms, or other complications
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
## 2018-02-22 ENCOUNTER — Other Ambulatory Visit (INDEPENDENT_AMBULATORY_CARE_PROVIDER_SITE_OTHER): Payer: Medicare Other

## 2018-02-22 DIAGNOSIS — E1165 Type 2 diabetes mellitus with hyperglycemia: Secondary | ICD-10-CM | POA: Diagnosis not present

## 2018-02-22 DIAGNOSIS — E782 Mixed hyperlipidemia: Secondary | ICD-10-CM | POA: Diagnosis not present

## 2018-02-22 LAB — COMPREHENSIVE METABOLIC PANEL
ALBUMIN: 4.1 g/dL (ref 3.5–5.2)
ALK PHOS: 38 U/L — AB (ref 39–117)
ALT: 26 U/L (ref 0–53)
AST: 20 U/L (ref 0–37)
BILIRUBIN TOTAL: 0.4 mg/dL (ref 0.2–1.2)
BUN: 30 mg/dL — ABNORMAL HIGH (ref 6–23)
CALCIUM: 9 mg/dL (ref 8.4–10.5)
CO2: 25 mEq/L (ref 19–32)
Chloride: 105 mEq/L (ref 96–112)
Creatinine, Ser: 1.81 mg/dL — ABNORMAL HIGH (ref 0.40–1.50)
GFR: 39.62 mL/min — AB (ref >60.00)
Glucose, Bld: 85 mg/dL (ref 70–99)
POTASSIUM: 4.5 meq/L (ref 3.5–5.1)
Sodium: 138 mEq/L (ref 135–145)
TOTAL PROTEIN: 7.2 g/dL (ref 6.0–8.3)

## 2018-02-22 LAB — LIPID PANEL
CHOLESTEROL: 109 mg/dL (ref 0–200)
HDL: 28.7 mg/dL — AB (ref >39.00)
LDL Cholesterol: 60 mg/dL (ref 0–99)
NONHDL: 80.11
TRIGLYCERIDES: 103 mg/dL (ref 0.0–149.0)
Total CHOL/HDL Ratio: 4
VLDL: 20.6 mg/dL (ref 0.0–40.0)

## 2018-02-22 LAB — HEMOGLOBIN A1C: Hgb A1c MFr Bld: 7.1 % — ABNORMAL HIGH (ref 4.6–6.5)

## 2018-02-23 ENCOUNTER — Encounter: Payer: Self-pay | Admitting: Adult Health

## 2018-02-23 DIAGNOSIS — H2513 Age-related nuclear cataract, bilateral: Secondary | ICD-10-CM | POA: Diagnosis not present

## 2018-02-23 DIAGNOSIS — E119 Type 2 diabetes mellitus without complications: Secondary | ICD-10-CM | POA: Diagnosis not present

## 2018-02-24 ENCOUNTER — Ambulatory Visit (INDEPENDENT_AMBULATORY_CARE_PROVIDER_SITE_OTHER): Payer: Medicare Other | Admitting: Endocrinology

## 2018-02-24 ENCOUNTER — Encounter: Payer: Self-pay | Admitting: Endocrinology

## 2018-02-24 VITALS — BP 132/70 | HR 60 | Ht 63.5 in | Wt 143.6 lb

## 2018-02-24 DIAGNOSIS — I25810 Atherosclerosis of coronary artery bypass graft(s) without angina pectoris: Secondary | ICD-10-CM | POA: Diagnosis not present

## 2018-02-24 DIAGNOSIS — E1165 Type 2 diabetes mellitus with hyperglycemia: Secondary | ICD-10-CM | POA: Diagnosis not present

## 2018-02-24 NOTE — Progress Notes (Signed)
Patient ID: [name], male   DOB: 06-04-1946, 72 y.o.   MRN: [id number]           Reason for Appointment: Follow-up for Type 2 Diabetes  Referring physician: Charlett Blake  History of Present Illness:          Date of diagnosis of type 2 diabetes mellitus: 2013        Background history:  He only mildly increased blood sugar levels at the time of diagnosis and A1c was 7.1  He had been treated initially with metformin but this was stopped subsequently when his renal function was worse  He was subsequently treated with glipizide but his blood sugars had been poorly controlled in 2016 with glipizide alone Since his A1c has been persistently over 9% he was started on Januvia 50 mg in 01/2015 At initial consultation he had a high A1c of 9.5; he was switched from glipizide to Prandin in 02/2015 Previously when he had high fasting readings he was started on Toujeo on 06/11/16, however this was stopped in April 2018 when he was getting hypoglycemia  Recent history:    Oral hypoglycemic drugs: Prandin 4 mg at breakfast  and 2 at dinner and 2 mg before lunch , Actos 15 mg daily, metformin ER 500 mg daily   His A1c is again 7.1, previous range 6.3-7.6   Current blood sugar patterns and problems identified:  He did not check his blood sugars until last night and none for about 6 weeks  This is despite reminding him to sugar regularly on the last visit  He has had only one episode of low blood sugar at lunchtime but this was about 6 weeks ago currently he is eating toast and oatmeal in the morning and usually not getting protein as before  However has not had any significant low blood sugars at bedtime that he had previously with reducing the Prandin down to 2 mg at suppertime  His lab glucose was 85  He says he is not walking because of not being in a safe neighborhood   Side effects from medications have been: None  Compliance with the medical regimen: Good, usually trying to take his  Prandin before eating   Glucose monitoring:  done usually 0-1  times a day         Glucometer:  FreeStyle     Blood Glucose readings not available Has only one blood sugar of 187 last night after supper the bottom  Dietician visit, most recent: 8/15 DIET:  has been trying to reduce fried/usually not eating high fat  foods; eating oatmeal in the morning With yogurt               Dinner usually at 6 pm Exercise:  walking 2 miles, 2/7 days  .  Weight history:   Wt Readings from Last 3 Encounters:  02/24/18 143 lb 9.6 oz (65.1 kg)  01/21/18 144 lb (65.3 kg)  01/15/18 143 lb (64.9 kg)    Glycemic control:   Lab Results  Component Value Date   HGBA1C 7.1 (H) 02/22/2018   HGBA1C 7.1 (H) 10/15/2017   HGBA1C 5.8 07/10/2017   Lab Results  Component Value Date   MICROALBUR 3.4 (H) 07/10/2017   LDLCALC 60 02/22/2018   CREATININE 1.81 (H) 02/22/2018    Other active problems: See review of systems   Lab on 02/22/2018  Component Date Value Ref Range Status  . Sodium 02/22/2018 138  135 - 145 mEq/L Final  .  Potassium 02/22/2018 4.5  3.5 - 5.1 mEq/L Final  . Chloride 02/22/2018 105  96 - 112 mEq/L Final  . CO2 02/22/2018 25  19 - 32 mEq/L Final  . Glucose, Bld 02/22/2018 85  70 - 99 mg/dL Final  . BUN 02/22/2018 30* 6 - 23 mg/dL Final  . Creatinine, Ser 02/22/2018 1.81* 0.40 - 1.50 mg/dL Final  . Total Bilirubin 02/22/2018 0.4  0.2 - 1.2 mg/dL Final  . Alkaline Phosphatase 02/22/2018 38* 39 - 117 U/L Final  . AST 02/22/2018 20  0 - 37 U/L Final  . ALT 02/22/2018 26  0 - 53 U/L Final  . Total Protein 02/22/2018 7.2  6.0 - 8.3 g/dL Final  . Albumin 02/22/2018 4.1  3.5 - 5.2 g/dL Final  . Calcium 02/22/2018 9.0  8.4 - 10.5 mg/dL Final  . GFR 02/22/2018 39.62* >60.00 mL/min Final  . Cholesterol 02/22/2018 109  0 - 200 mg/dL Final   ATP III Classification       Desirable:  < 200 mg/dL               Borderline High:  200 - 239 mg/dL          High:  > = 240 mg/dL  . Triglycerides  02/22/2018 103.0  0.0 - 149.0 mg/dL Final   Normal:  <150 mg/dLBorderline High:  150 - 199 mg/dL  . HDL 02/22/2018 28.70* >39.00 mg/dL Final  . VLDL 02/22/2018 20.6  0.0 - 40.0 mg/dL Final  . LDL Cholesterol 02/22/2018 60  0 - 99 mg/dL Final  . Total CHOL/HDL Ratio 02/22/2018 4   Final                  Men          Women1/2 Average Risk     3.4          3.3Average Risk          5.0          4.42X Average Risk          9.6          7.13X Average Risk          15.0          11.0                      . NonHDL 02/22/2018 80.11   Final   NOTE:  Non-HDL goal should be 30 mg/dL higher than patient's LDL goal (i.e. LDL goal of < 70 mg/dL, would have non-HDL goal of < 100 mg/dL)  . Hgb A1c MFr Bld 02/22/2018 7.1* 4.6 - 6.5 % Final   Glycemic Control Guidelines for People with Diabetes:Non Diabetic:  <6%Goal of Therapy: <7%Additional Action Suggested:  >8%        Allergies as of 02/24/2018   No Known Allergies     Medication List        Accurate as of 02/24/18  9:20 AM. Always use your most recent med list.          acetaminophen 325 MG tablet Commonly known as:  TYLENOL Take 2 tablets (650 mg total) by mouth every 4 (four) hours as needed for headache or mild pain.   aspirin 81 MG chewable tablet Chew 81 mg by mouth daily.   atorvastatin 20 MG tablet Commonly known as:  LIPITOR Take 1 tablet (20 mg total) by mouth daily.   b complex vitamins tablet Take 1  tablet by mouth daily.   Cholecalciferol 2000 units Caps Take 1 capsule (2,000 Units total) by mouth daily.   clopidogrel 75 MG tablet Commonly known as:  PLAVIX Take 1 tablet (75 mg total) by mouth daily.   dolutegravir 50 MG tablet Commonly known as:  TIVICAY Take 1 tablet (50 mg total) by mouth daily.   doravirine 100 MG Tabs tablet Commonly known as:  PIFELTRO Take 1 tablet (100 mg total) by mouth daily.   fenofibrate 54 MG tablet Take 1 tablet (54 mg total) by mouth daily.   freestyle lancets Use as directed  three times a day to check blood sugar.  DX E11.9   FREESTYLE TEST STRIPS test strip Generic drug:  glucose blood USE TO CHECK BLOOD SUGAR THREE TIMES DAILY   furosemide 20 MG tablet Commonly known as:  LASIX TAKE 2 TABLETS BY MOUTH DAILY AS NEEDED FOR FLUID RETENTION OR SWELLING   Icosapent Ethyl 1 g Caps Commonly known as:  VASCEPA Take 2 capsules (2 g total) by mouth 2 (two) times daily.   isosorbide mononitrate 30 MG 24 hr tablet Commonly known as:  IMDUR TAKE 1 TABLET(30 MG) BY MOUTH DAILY   lisinopril 5 MG tablet Commonly known as:  PRINIVIL,ZESTRIL Take 1 tablet (5 mg total) by mouth daily.   metFORMIN 500 MG 24 hr tablet Commonly known as:  GLUCOPHAGE-XR Take 500 mg by mouth daily with breakfast.   metoprolol tartrate 25 MG tablet Commonly known as:  LOPRESSOR TAKE 1/2 TABLET BY MOUTH TWICE DAILY   neomycin-polymyxin-hydrocortisone OTIC solution Commonly known as:  CORTISPORIN INSTILL 3 DROPS IN BOTH EARS THREE TIMES DAILY   nitroGLYCERIN 0.4 MG SL tablet Commonly known as:  NITROSTAT Place 1 tablet (0.4 mg total) under the tongue every 5 (five) minutes as needed for chest pain.   pantoprazole 40 MG tablet Commonly known as:  PROTONIX Take 1 tablet (40 mg total) by mouth daily.   pioglitazone 15 MG tablet Commonly known as:  ACTOS TAKE 1 TABLET(15 MG) BY MOUTH DAILY   potassium chloride SA 20 MEQ tablet Commonly known as:  K-DUR,KLOR-CON Take 1 tablet (20 mEq total) by mouth 3 (three) times daily.   PROBIOTIC DAILY Caps Take 1 by mouth daily   repaglinide 2 MG tablet Commonly known as:  PRANDIN TAKE 2 TABLETS BY MOUTH BEFORE BREAKFAST, 1 TABLET BY MOUTH BEFORE LUNCH AND 2 TABLETS BY MOUTH BEFORE SUPPER   sodium bicarbonate 650 MG tablet Take 650 mg by mouth 3 (three) times daily.   triamcinolone cream 0.1 % Commonly known as:  KENALOG apply twice daily as needed to dry, itchy skin   valACYclovir 500 MG tablet Commonly known as:  VALTREX TAKE 1  TABLET BY MOUTH DAILY       Allergies: No Known Allergies  Past Medical History:  Diagnosis Date  . Absolute anemia 05/28/2014  . Aortic stenosis 09/17/2015  . Arthritis   . Ascites 11/18/2013  . BELLS PALSY 07/19/2010   Qualifier: Diagnosis of  By: [name], [name]    . CAD (coronary artery disease)    a. s/p CABG in 2003 b. s/p PCI to SVG-PDA in 07/2015 c. 11/2016: cath showing severe native CAD with patent LIMA-LAD and SVG-D1 with 80% stenosis of SVG-OM1-OM2. Initially medical management was recommended --> presented with recurrent angina --> s/p Synergy DES to proximal body of SVG-OM1-OM2, POBA to distal graft.   Marland Kitchen CAD- S/P PCI SVG-OM1 12/01/16 05/12/2006   Qualifier: Diagnosis of  By: Megan Salon  MD, Jenny Reichmann    . Carotid bruit 07/10/2015  . Chest pain 11/22/2016  . Chronic kidney disease   . Chronic renal insufficiency, stage III (moderate) ([place]) 12/02/2016  . Constipation 05/28/2014  . COPD 07/20/2008   Qualifier: Diagnosis of  By: [name], [name]   . DEPRESSION 09/03/2006   Qualifier: Diagnosis of  By: [name], [name]    . Dermatitis 11/27/2012  . Diabetes mellitus   . Diarrhea 11/20/2013  . DM (diabetes mellitus), type 2 with ophthalmic complications ([place]) 8/67/6195   Qualifier: Diagnosis of  By: [name], [name]    . Dry eye syndrome 12/20/2010  . Epicondylitis 06/05/2013   right  . Erectile dysfunction 01/01/2012  . Essential hypertension 05/12/2006   Qualifier: Diagnosis of  By: [name], [name]    . GENITAL HERPES 05/03/2009   Qualifier: Diagnosis of  By: [name], [name]    . GERD 09/03/2006   Qualifier: Diagnosis of  By: [name], [name]    . GERD (gastroesophageal reflux disease)   . HEARING LOSS, SENSORINEURAL 05/12/2006   Qualifier: Diagnosis of  By: [name], [name]    . HEMATOCHEZIA 04/18/2008   Annotation: 9/09 during bout of constipation Qualifier: Diagnosis of  By: [name], [name]    . HIP PAIN, BILATERAL 07/17/2008   Qualifier: Diagnosis of  By: [name], [name]   . History of depression   . History of kidney stones   . HIV (human immunodeficiency virus infection) ([place]) 1991   on meds since initial dx.   Marland Kitchen HLD (hyperlipidemia) 11/19/2013  . Human immunodeficiency virus (HIV) disease ([place]) 05/12/2006   Qualifier: Diagnosis of  By: [name], [name]    . Hx of CABG 09/03/2006   Annotation: 2003 Qualifier: Diagnosis of  By: [name], [name]    . Hyperkalemia 03/23/2014  . Hyperlipidemia   . Hypertension   . Hyponatremia 03/23/2014  . INGUINAL LYMPHADENOPATHY, RIGHT 04/03/2009   Qualifier: Diagnosis of  By: [name], [name]    . Keratoma 01/24/2015  . KNEE PAIN, BILATERAL 07/17/2008   Qualifier: Diagnosis of  By: [name], [name]   . Lesion of breast 07/21/2015  . Lipodystrophy 12/20/2010  . Memory loss 09/18/2008   Qualifier: Diagnosis of  By: [name], [name]   . Metatarsal deformity 01/24/2015  . Nasal abscess 12/04/2015  . Night sweats 07/06/2012  . Nocturia 03/06/2013  . NSTEMI (non-ST elevated myocardial infarction) ([place]) 11/29/2016  . Pain in joint, ankle and foot 01/19/2015  . Pancreatitis 11/2013   attributed to HIV meds.   . Pedal edema 05/21/2014  . PERIPHERAL VASCULAR DISEASE 07/20/2008   Qualifier: Diagnosis of  By: [name], [name]   . Posterior cervical lymphadenopathy 03/30/2014  . Protein-calorie malnutrition, severe ([place]) 03/23/2014  . SHINGLES, HX OF 05/03/2009   Annotation: R leg Qualifier: Diagnosis of  By: [name], [name]    . STEMI (ST elevation myocardial infarction) ([place]) 07/29/2015  . Unstable angina ([place]) 11/24/2016  . WEIGHT LOSS, ABNORMAL 04/03/2009   Qualifier: Diagnosis of  By: [name], [name]      Past Surgical History:  Procedure Laterality Date  . CARDIAC CATHETERIZATION N/A 07/29/2015   Procedure: Left Heart Cath and Coronary Angiography;  Surgeon: [name], MD;  Location: [facility];  Service: Cardiovascular;  Laterality: N/A;  . CARDIAC CATHETERIZATION N/A 07/29/2015   Procedure: Coronary Stent  Intervention;  Surgeon: [name], MD;  Location: [facility];  Service: Cardiovascular;  Laterality: N/A;  . CORONARY ARTERY BYPASS GRAFT  05/2002  . CORONARY STENT INTERVENTION N/A 12/01/2016   Procedure: Coronary Stent Intervention;  Surgeon: [name], MD;  Location: [facility];  Service: Cardiovascular;  Laterality: N/A;  . CORONARY STENT INTERVENTION N/A 10/21/2017   Procedure: CORONARY STENT INTERVENTION;  Surgeon: Jettie Booze, MD;  Location: [facility];  Service: Cardiovascular;  Laterality: N/A;  . LEFT HEART CATH AND CORS/GRAFTS ANGIOGRAPHY N/A 11/24/2016   Procedure: Left Heart Cath and Cors/Grafts Angiography;  Surgeon: Nelva Bush, MD;  Location: [facility];  Service: Cardiovascular;  Laterality: N/A;  . LEFT HEART CATH AND CORS/GRAFTS ANGIOGRAPHY N/A 12/01/2016   Procedure: Left Heart Cath and Cors/Grafts Angiography;  Surgeon: [name], MD;  Location: [facility];  Service: Cardiovascular;  Laterality: N/A;  . LEFT HEART CATH AND CORS/GRAFTS ANGIOGRAPHY N/A 10/21/2017   Procedure: LEFT HEART CATH AND CORS/GRAFTS ANGIOGRAPHY;  Surgeon: Jettie Booze, MD;  Location: [facility];  Service: Cardiovascular;  Laterality: N/A;  . VASECTOMY      Family History  Problem Relation Age of Onset  . Hyperlipidemia Mother   . Diabetes Mother        paternal grandparents/1 brother  . Hyperlipidemia Father   . Hypertension Father        paternal grandmother/3 brothers/1 sister  . Arthritis Unknown        mother/father/paternal grandparents  . Breast cancer Maternal Aunt        paternal aunt  . Lung cancer Maternal Aunt   . Heart disease Unknown        parents/maternal grandparents/ 2 brothers  . Stroke Paternal Grandmother   . Mental retardation Sister   . Colon cancer Neg Hx   . Esophageal cancer Neg Hx   . Pancreatic cancer Neg Hx   . Stomach cancer Neg Hx   . Liver disease Neg Hx     Social  History:  reports that he has never smoked. He has never used smokeless tobacco. He reports that he drinks alcohol. He reports that he does not use drugs.    Review of Systems    Lipid history: On Lipitor 40 mg, followed by PCP His triglycerides were nearly 410 March and now taking Vascepa with excellent control  Also has significant history of CAD   Lab Results  Component Value Date   CHOL 109 02/22/2018   HDL 28.70 (L) 02/22/2018   LDLCALC 60 02/22/2018   LDLDIRECT 72.0 11/04/2016   TRIG 103.0 02/22/2018   CHOLHDL 4 02/22/2018            RENAL dysfunction: Followed by nephrologist,This is mild  and creatinine somewhat variable  Lab Results  Component Value Date   CREATININE 1.81 (H) 02/22/2018   CREATININE 1.67 (H) 01/07/2018   CREATININE 1.60 (H) 12/18/2017      Hypertension:taking 5 mg lisinopril And 25 mg metoprolol  Followed by cardiologist  BP Readings from Last 3 Encounters:  02/24/18 132/70  02/15/18 (!) 149/75  01/21/18 (!) 147/80    Diabetic foot exam  in 5/19 shows normal monofilament sensation in the toes and plantar surfaces, no skin lesions or ulcers on the feet and absent pedal pulses    Physical Examination:  BP 132/70 (BP Location: Left Arm, Patient Position: Sitting, Cuff Size: Normal)   Pulse 60   Ht 5' 3.5" (1.613 m)   Wt 143 lb 9.6  oz (65.1 kg)   SpO2 97%   BMI 25.04 kg/m   No ankle edema  ASSESSMENT:  Diabetes type 2, uncontrolled with BMI 25; has  abdominal obesity   See history of present illness for detailed discussion of current diabetes management, blood sugar patterns and problems identified  He has a stable A1c of 7.1   Is not checking his blood sugars much at home difficult to know what his blood sugar patterns on Also not doing as much walking as before Discussed planning his meals especially breakfast better with less carbohydrate and more protein  HIGH triglycerides: Much better controlled with adding  Vascepa  LDL below 70  RENAL insufficiency: Recently is somewhat worse, followed by nephrology   PLAN:  He will try to walk more regularly No change in Prandin dosage unless he has abnormally high sugars consistently  No change in Actos or metformin for insulin resistance, will need to adjust metformin further if renal function gets worse  CAD, risk reduction and dyslipidemia: He will continue Vascepa check with his pharmacy about getting regular supplies since currently it is out of stock    There are no Patient Instructions on file for this visit.   Elayne Snare 02/24/2018, 9:20 AM   Note: This office note was prepared with Dragon voice recognition system technology. Any transcriptional errors that result from this process are unintentional.

## 2018-02-24 NOTE — Patient Instructions (Addendum)
Add protein in am  Restart checking sugar

## 2018-02-25 NOTE — Progress Notes (Signed)
HPI: FU coronary artery disease. Patient is status post coronary artery bypass and graft in 2003. Carotid Dopplers December 2016 showed 1-39% bilateral stenosis. Patient had PCI of the sequential graft to the ramus and OM branches 4/18.  Last echocardiogram March 2019 showed normal LV function, grade 1 diastolic dysfunction and mild mitral regurgitation.  Patient had PCI of the saphenous vein graft to the diagonal and proximal graft of the saphenous vein graft to the obtuse marginal 3/19. The previously ballooned second part of SVG to obtuse marginal jump graft is now occluded, there were left to left collaterals filling the distal left circumflex artery. Since last seen,  patient had brief chest tightness approximately 8 weeks ago relieved with sublingual nitroglycerin x1.  He otherwise denies chest pain, dyspnea or syncope.  Current Outpatient Medications  Medication Sig Dispense Refill  . acetaminophen (TYLENOL) 325 MG tablet Take 2 tablets (650 mg total) by mouth every 4 (four) hours as needed for headache or mild pain.    Marland Kitchen aspirin 81 MG chewable tablet Chew 81 mg by mouth daily.    Marland Kitchen atorvastatin (LIPITOR) 20 MG tablet Take 1 tablet (20 mg total) by mouth daily. 90 tablet 3  . b complex vitamins tablet Take 1 tablet by mouth daily. 30 tablet 11  . Cholecalciferol 2000 units CAPS Take 1 capsule (2,000 Units total) by mouth daily. 30 each 11  . clopidogrel (PLAVIX) 75 MG tablet Take 1 tablet (75 mg total) by mouth daily. 30 tablet 11  . dolutegravir (TIVICAY) 50 MG tablet Take 1 tablet (50 mg total) by mouth daily. 30 tablet 11  . doravirine (PIFELTRO) 100 MG TABS tablet Take 1 tablet (100 mg total) by mouth daily. 30 tablet 11  . fenofibrate 54 MG tablet Take 1 tablet (54 mg total) by mouth daily. 90 tablet 3  . FREESTYLE TEST STRIPS test strip USE TO CHECK BLOOD SUGAR THREE TIMES DAILY 300 each 3  . furosemide (LASIX) 20 MG tablet TAKE 2 TABLETS BY MOUTH DAILY AS NEEDED FOR FLUID  RETENTION OR SWELLING (Patient taking differently: TAKE 1 TABLET BY MOUTH DAILY. TAKE AN ADDITIONAL TABLET AS NEEDED FOR FLUID RETENTION OR SWELLING) 90 tablet 0  . Icosapent Ethyl (VASCEPA) 1 g CAPS Take 2 capsules (2 g total) by mouth 2 (two) times daily. 120 capsule 2  . isosorbide mononitrate (IMDUR) 30 MG 24 hr tablet TAKE 1 TABLET(30 MG) BY MOUTH DAILY (Patient taking differently: 15mg  by mouth twice daily) 90 tablet 2  . Lancets (FREESTYLE) lancets Use as directed three times a day to check blood sugar.  DX E11.9 100 each 5  . lisinopril (PRINIVIL,ZESTRIL) 5 MG tablet Take 1 tablet (5 mg total) by mouth daily. 90 tablet 3  . metFORMIN (GLUCOPHAGE-XR) 500 MG 24 hr tablet Take 500 mg by mouth daily with breakfast.    . metoprolol tartrate (LOPRESSOR) 25 MG tablet TAKE 1/2 TABLET BY MOUTH TWICE DAILY 60 tablet 11  . neomycin-polymyxin-hydrocortisone (CORTISPORIN) otic solution INSTILL 3 DROPS IN BOTH EARS THREE TIMES DAILY (Patient taking differently: 3 drops into both ears daily as needed) 10 mL 0  . nitroGLYCERIN (NITROSTAT) 0.4 MG SL tablet Place 1 tablet (0.4 mg total) under the tongue every 5 (five) minutes as needed for chest pain. 25 tablet 3  . pantoprazole (PROTONIX) 40 MG tablet Take 1 tablet (40 mg total) by mouth daily. 30 tablet 5  . pioglitazone (ACTOS) 15 MG tablet TAKE 1 TABLET(15 MG) BY MOUTH DAILY  30 tablet 3  . potassium chloride SA (K-DUR,KLOR-CON) 20 MEQ tablet Take 1 tablet (20 mEq total) by mouth 3 (three) times daily. 270 tablet 3  . Probiotic Product (PROBIOTIC DAILY) CAPS Take 1 by mouth daily 30 capsule 11  . repaglinide (PRANDIN) 2 MG tablet TAKE 2 TABLETS BY MOUTH BEFORE BREAKFAST, 1 TABLET BY MOUTH BEFORE LUNCH AND 2 TABLETS BY MOUTH BEFORE SUPPER 450 tablet 0  . sodium bicarbonate 650 MG tablet Take 650 mg by mouth 3 (three) times daily.    Marland Kitchen triamcinolone cream (KENALOG) 0.1 % apply twice daily as needed to dry, itchy skin  2  . valACYclovir (VALTREX) 500 MG tablet  TAKE 1 TABLET BY MOUTH DAILY 30 tablet 3   No current facility-administered medications for this visit.      Past Medical History:  Diagnosis Date  . Absolute anemia 05/28/2014  . Aortic stenosis 09/17/2015  . Arthritis   . Ascites 11/18/2013  . BELLS PALSY 07/19/2010   Qualifier: Diagnosis of  By: [name], [name]    . CAD (coronary artery disease)    a. s/p CABG in 2003 b. s/p PCI to SVG-PDA in 07/2015 c. 11/2016: cath showing severe native CAD with patent LIMA-LAD and SVG-D1 with 80% stenosis of SVG-OM1-OM2. Initially medical management was recommended --> presented with recurrent angina --> s/p Synergy DES to proximal body of SVG-OM1-OM2, POBA to distal graft.   Marland Kitchen CAD- S/P PCI SVG-OM1 12/01/16 05/12/2006   Qualifier: Diagnosis of  By: [name], [name]    . Carotid bruit 07/10/2015  . Chest pain 11/22/2016  . Chronic kidney disease   . Chronic renal insufficiency, stage III (moderate) ([place]) 12/02/2016  . Constipation 05/28/2014  . COPD 07/20/2008   Qualifier: Diagnosis of  By: [name], [name]   . DEPRESSION 09/03/2006   Qualifier: Diagnosis of  By: [name], [name]    . Dermatitis 11/27/2012  . Diabetes mellitus   . Diarrhea 11/20/2013  . DM (diabetes mellitus), type 2 with ophthalmic complications ([place]) 11/03/270   Qualifier: Diagnosis of  By: [name], [name]    . Dry eye syndrome 12/20/2010  . Epicondylitis 06/05/2013   right  . Erectile dysfunction 01/01/2012  . Essential hypertension 05/12/2006   Qualifier: Diagnosis of  By: [name], [name]    . GENITAL HERPES 05/03/2009   Qualifier: Diagnosis of  By: [name], [name]    . GERD 09/03/2006   Qualifier: Diagnosis of  By: [name], [name]    . GERD (gastroesophageal reflux disease)   . HEARING LOSS, SENSORINEURAL 05/12/2006   Qualifier: Diagnosis of  By: [name], [name]    . HEMATOCHEZIA 04/18/2008   Annotation: 9/09 during bout of constipation Qualifier: Diagnosis of  By: [name], [name]    . HIP PAIN, BILATERAL  07/17/2008   Qualifier: Diagnosis of  By: [name], [name]   . History of depression   . History of kidney stones   . HIV (human immunodeficiency virus infection) ([place]) 1991   on meds since initial dx.   Marland Kitchen HLD (hyperlipidemia) 11/19/2013  . Human immunodeficiency virus (HIV) disease ([place]) 05/12/2006   Qualifier: Diagnosis of  By: [name], [name]    . Hx of CABG 09/03/2006   Annotation: 2003 Qualifier: Diagnosis of  By: [name], [name]    . Hyperkalemia 03/23/2014  . Hyperlipidemia   . Hypertension   . Hyponatremia 03/23/2014  . INGUINAL LYMPHADENOPATHY, RIGHT 04/03/2009   Qualifier: Diagnosis of  By:  [name], Jenny Reichmann    . Keratoma 01/24/2015  . KNEE PAIN, BILATERAL 07/17/2008   Qualifier: Diagnosis of  By: [name], [name]   . Lesion of breast 07/21/2015  . Lipodystrophy 12/20/2010  . Memory loss 09/18/2008   Qualifier: Diagnosis of  By: [name], [name]   . Metatarsal deformity 01/24/2015  . Nasal abscess 12/04/2015  . Night sweats 07/06/2012  . Nocturia 03/06/2013  . NSTEMI (non-ST elevated myocardial infarction) ([place]) 11/29/2016  . Pain in joint, ankle and foot 01/19/2015  . Pancreatitis 11/2013   attributed to HIV meds.   . Pedal edema 05/21/2014  . PERIPHERAL VASCULAR DISEASE 07/20/2008   Qualifier: Diagnosis of  By: [name], [name]   . Posterior cervical lymphadenopathy 03/30/2014  . Protein-calorie malnutrition, severe ([place]) 03/23/2014  . SHINGLES, HX OF 05/03/2009   Annotation: R leg Qualifier: Diagnosis of  By: [name], [name]    . STEMI (ST elevation myocardial infarction) ([place]) 07/29/2015  . Unstable angina ([place]) 11/24/2016  . WEIGHT LOSS, ABNORMAL 04/03/2009   Qualifier: Diagnosis of  By: [name], [name]      Past Surgical History:  Procedure Laterality Date  . CARDIAC CATHETERIZATION N/A 07/29/2015   Procedure: Left Heart Cath and Coronary Angiography;  Surgeon: [name], MD;  Location: [facility];  Service: Cardiovascular;  Laterality: N/A;  . CARDIAC  CATHETERIZATION N/A 07/29/2015   Procedure: Coronary Stent Intervention;  Surgeon: [name], MD;  Location: [facility];  Service: Cardiovascular;  Laterality: N/A;  . CORONARY ARTERY BYPASS GRAFT  05/2002  . CORONARY STENT INTERVENTION N/A 12/01/2016   Procedure: Coronary Stent Intervention;  Surgeon: [name], MD;  Location: [facility];  Service: Cardiovascular;  Laterality: N/A;  . CORONARY STENT INTERVENTION N/A 10/21/2017   Procedure: CORONARY STENT INTERVENTION;  Surgeon: Jettie Booze, MD;  Location: [facility];  Service: Cardiovascular;  Laterality: N/A;  . LEFT HEART CATH AND CORS/GRAFTS ANGIOGRAPHY N/A 11/24/2016   Procedure: Left Heart Cath and Cors/Grafts Angiography;  Surgeon: Nelva Bush, MD;  Location: [facility];  Service: Cardiovascular;  Laterality: N/A;  . LEFT HEART CATH AND CORS/GRAFTS ANGIOGRAPHY N/A 12/01/2016   Procedure: Left Heart Cath and Cors/Grafts Angiography;  Surgeon: [name], MD;  Location: [facility];  Service: Cardiovascular;  Laterality: N/A;  . LEFT HEART CATH AND CORS/GRAFTS ANGIOGRAPHY N/A 10/21/2017   Procedure: LEFT HEART CATH AND CORS/GRAFTS ANGIOGRAPHY;  Surgeon: Jettie Booze, MD;  Location: [facility];  Service: Cardiovascular;  Laterality: N/A;  . VASECTOMY      Social History   Socioeconomic History  . Marital status: Divorced    Spouse name: Not on file  . Number of children: 4  . Years of education: Not on file  . Highest education level: Not on file  Occupational History  . 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   Social Needs  . Financial resource strain: Not on file  . Food insecurity:    Worry: Not on file    Inability: Not on file  . Transportation needs:    Medical: Not on file    Non-medical: Not on file  Tobacco Use  . Smoking status: Never Smoker  . Smokeless tobacco: Never Used    Substance and Sexual Activity  . Alcohol use: Yes    Alcohol/week: 0.0 oz  Comment: rare  . Drug use: No  . Sexual activity: Not Currently    Comment: declined condoms  Lifestyle  . Physical activity:    Days per week: Not on file    Minutes per session: Not on file  . Stress: Not on file  Relationships  . Social connections:    Talks on phone: Not on file    Gets together: Not on file    Attends religious service: Not on file    Active member of club or organization: Not on file    Attends meetings of clubs or organizations: Not on file    Relationship status: Not on file  . Intimate partner violence:    Fear of current or ex partner: Not on file    Emotionally abused: Not on file    Physically abused: Not on file    Forced sexual activity: Not on file  Other Topics Concern  . Not on file  Social History Narrative   Lives alone.  Supportive friends and family.  His HIV Dx is not a secret.     Family History  Problem Relation Age of Onset  . Hyperlipidemia Mother   . Diabetes Mother        paternal grandparents/1 brother  . Hyperlipidemia Father   . Hypertension Father        paternal grandmother/3 brothers/1 sister  . Arthritis Unknown        mother/father/paternal grandparents  . Breast cancer Maternal Aunt        paternal aunt  . Lung cancer Maternal Aunt   . Heart disease Unknown        parents/maternal grandparents/ 2 brothers  . Stroke Paternal Grandmother   . Mental retardation Sister   . Colon cancer Neg Hx   . Esophageal cancer Neg Hx   . Pancreatic cancer Neg Hx   . Stomach cancer Neg Hx   . Liver disease Neg Hx     ROS: no fevers or chills, productive cough, hemoptysis, dysphasia, odynophagia, melena, hematochezia, dysuria, hematuria, rash, seizure activity, orthopnea, PND, pedal edema, claudication. Remaining systems are negative.  Physical Exam: Well-developed well-nourished in no acute distress.  Skin is warm and dry.  HEENT is normal.   Neck is supple.  Chest is clear to auscultation with normal expansion.  Cardiovascular exam is regular rate and rhythm.  2/6 systolic murmur.  S2 is not diminished. Abdominal exam nontender or distended. No masses palpated. Extremities show no edema. neuro grossly intact   A/P  1 coronary artery disease status post coronary artery bypass and graft as well as PCI-plan to continue medical therapy including aspirin, plavix and statin.  Plavix should be continued through March 2020.  2 hypertension-blood pressure is controlled.  Continue present medications.  3 hyperlipidemia-continue statin.  4 chronic stage III kidney disease-followed by primary care.  5 History of mild aortic stenosis-no evidence on most recent echocardiogram.  Kirk Ruths, MD

## 2018-03-01 ENCOUNTER — Ambulatory Visit (INDEPENDENT_AMBULATORY_CARE_PROVIDER_SITE_OTHER): Payer: Medicare Other | Admitting: Cardiology

## 2018-03-01 ENCOUNTER — Encounter: Payer: Self-pay | Admitting: Cardiology

## 2018-03-01 VITALS — BP 128/70 | HR 65 | Ht 63.0 in | Wt 142.6 lb

## 2018-03-01 DIAGNOSIS — I10 Essential (primary) hypertension: Secondary | ICD-10-CM

## 2018-03-01 DIAGNOSIS — E7800 Pure hypercholesterolemia, unspecified: Secondary | ICD-10-CM | POA: Diagnosis not present

## 2018-03-01 DIAGNOSIS — I2510 Atherosclerotic heart disease of native coronary artery without angina pectoris: Secondary | ICD-10-CM

## 2018-03-01 DIAGNOSIS — I25810 Atherosclerosis of coronary artery bypass graft(s) without angina pectoris: Secondary | ICD-10-CM

## 2018-03-01 NOTE — Patient Instructions (Signed)
Your physician wants you to follow-up in: 6 MONTHS WITH DR CRENSHAW You will receive a reminder letter in the mail two months in advance. If you don't receive a letter, please call our office to schedule the follow-up appointment.   If you need a refill on your cardiac medications before your next appointment, please call your pharmacy.  

## 2018-03-04 ENCOUNTER — Other Ambulatory Visit: Payer: Self-pay

## 2018-03-04 ENCOUNTER — Encounter: Payer: Self-pay | Admitting: Endocrinology

## 2018-03-04 MED ORDER — METFORMIN HCL ER 500 MG PO TB24: 500.0000 mg | ORAL_TABLET | Freq: Every day | ORAL | 3 refills | Status: DC

## 2018-03-12 ENCOUNTER — Ambulatory Visit (INDEPENDENT_AMBULATORY_CARE_PROVIDER_SITE_OTHER): Payer: Medicare Other | Admitting: Adult Health

## 2018-03-12 ENCOUNTER — Encounter: Payer: Self-pay | Admitting: Adult Health

## 2018-03-12 ENCOUNTER — Ambulatory Visit (INDEPENDENT_AMBULATORY_CARE_PROVIDER_SITE_OTHER): Payer: Medicare Other

## 2018-03-12 VITALS — BP 100/60 | Temp 98.2°F | Wt 144.0 lb

## 2018-03-12 DIAGNOSIS — M545 Low back pain, unspecified: Secondary | ICD-10-CM

## 2018-03-12 DIAGNOSIS — I25810 Atherosclerosis of coronary artery bypass graft(s) without angina pectoris: Secondary | ICD-10-CM | POA: Diagnosis not present

## 2018-03-12 DIAGNOSIS — M25512 Pain in left shoulder: Secondary | ICD-10-CM

## 2018-03-12 DIAGNOSIS — M19012 Primary osteoarthritis, left shoulder: Secondary | ICD-10-CM | POA: Diagnosis not present

## 2018-03-12 MED ORDER — CYCLOBENZAPRINE HCL 10 MG PO TABS: 10.0000 mg | ORAL_TABLET | Freq: Every day | ORAL | 0 refills | Status: DC
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
## 2018-03-22 ENCOUNTER — Telehealth: Payer: Self-pay | Admitting: *Deleted

## 2018-03-22 NOTE — Telephone Encounter (Signed)
Prior auth for Cyclobenzaprine 10mg  sent to [email or web address]-key [id number].  Approval given-CaseId:[id number];Status:Approved;Review Type:Prior Auth;Coverage Start Date:02/20/2018;Coverage End Date:03/22/2019 and I called [facility] and left a detailed message on the voicemail with this info.

## 2018-03-23 ENCOUNTER — Other Ambulatory Visit: Payer: Self-pay | Admitting: Endocrinology

## 2018-03-29 ENCOUNTER — Other Ambulatory Visit: Payer: Self-pay | Admitting: Adult Health

## 2018-04-05 ENCOUNTER — Encounter: Payer: Self-pay | Admitting: Adult Health

## 2018-04-08 ENCOUNTER — Encounter: Payer: Self-pay | Admitting: Adult Health

## 2018-04-08 ENCOUNTER — Ambulatory Visit (INDEPENDENT_AMBULATORY_CARE_PROVIDER_SITE_OTHER): Payer: Medicare Other | Admitting: Adult Health

## 2018-04-08 VITALS — BP 110/60 | Temp 98.3°F | Wt 147.0 lb

## 2018-04-08 DIAGNOSIS — I25810 Atherosclerosis of coronary artery bypass graft(s) without angina pectoris: Secondary | ICD-10-CM | POA: Diagnosis not present

## 2018-04-08 DIAGNOSIS — M25512 Pain in left shoulder: Secondary | ICD-10-CM

## 2018-04-08 MED ORDER — METHYLPREDNISOLONE ACETATE 80 MG/ML IJ SUSP
80.0000 mg | Freq: Once | INTRAMUSCULAR | Status: AC
  Administered 2018-04-08: 80 mg via INTRA_ARTICULAR

## 2018-04-08 MED ORDER — METHYLPREDNISOLONE ACETATE 80 MG/ML IJ SUSP: 80.0000 mg | Freq: Once | INTRAMUSCULAR | Status: DC
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
## 2018-04-12 ENCOUNTER — Encounter: Payer: Self-pay | Admitting: Adult Health

## 2018-04-14 DIAGNOSIS — N401 Enlarged prostate with lower urinary tract symptoms: Secondary | ICD-10-CM | POA: Diagnosis not present

## 2018-04-14 DIAGNOSIS — R350 Frequency of micturition: Secondary | ICD-10-CM | POA: Diagnosis not present

## 2018-04-14 DIAGNOSIS — N400 Enlarged prostate without lower urinary tract symptoms: Secondary | ICD-10-CM | POA: Diagnosis not present

## 2018-04-14 DIAGNOSIS — R3912 Poor urinary stream: Secondary | ICD-10-CM | POA: Diagnosis not present

## 2018-04-14 LAB — PSA: PSA: 0.84

## 2018-04-16 ENCOUNTER — Other Ambulatory Visit: Payer: Self-pay | Admitting: Endocrinology

## 2018-04-16 MED ORDER — REPAGLINIDE 2 MG PO TABS: ORAL_TABLET | ORAL | 3 refills | Status: DC

## 2018-04-16 NOTE — Telephone Encounter (Signed)
Refilled Rx repaglinide disp--450, R--3 sent to [facility]

## 2018-04-22 ENCOUNTER — Other Ambulatory Visit: Payer: Self-pay | Admitting: Endocrinology

## 2018-04-29 ENCOUNTER — Ambulatory Visit: Payer: Medicare Other | Admitting: Adult Health

## 2018-04-30 ENCOUNTER — Encounter: Payer: Self-pay | Admitting: Adult Health

## 2018-04-30 ENCOUNTER — Ambulatory Visit (INDEPENDENT_AMBULATORY_CARE_PROVIDER_SITE_OTHER): Payer: Medicare Other | Admitting: Adult Health

## 2018-04-30 VITALS — BP 110/60 | HR 78 | Temp 98.3°F | Ht 63.0 in | Wt 144.6 lb

## 2018-04-30 DIAGNOSIS — Z23 Encounter for immunization: Secondary | ICD-10-CM

## 2018-04-30 DIAGNOSIS — M25512 Pain in left shoulder: Secondary | ICD-10-CM

## 2018-04-30 DIAGNOSIS — I25810 Atherosclerosis of coronary artery bypass graft(s) without angina pectoris: Secondary | ICD-10-CM

## 2018-04-30 NOTE — Progress Notes (Signed)
Subjective:    Patient ID: RODMAN RECUPERO, male    DOB: 1946/07/25, 72 y.o.   MRN: [id number]  HPI 72 year old male who  has a past medical history of Absolute anemia (05/28/2014), Aortic stenosis (09/17/2015), Arthritis, Ascites (11/18/2013), BELLS PALSY (07/19/2010), CAD (coronary artery disease), CAD- S/P PCI SVG-OM1 12/01/16 (05/12/2006), Carotid bruit (07/10/2015), Chest pain (11/22/2016), Chronic kidney disease, Chronic renal insufficiency, stage III (moderate) (HCC) (12/02/2016), Constipation (05/28/2014), COPD (07/20/2008), DEPRESSION (09/03/2006), Dermatitis (11/27/2012), Diabetes mellitus, Diarrhea (11/20/2013), DM (diabetes mellitus), type 2 with ophthalmic complications ([place]) (6/43/3295), Dry eye syndrome (12/20/2010), Epicondylitis (06/05/2013), Erectile dysfunction (01/01/2012), Essential hypertension (05/12/2006), GENITAL HERPES (05/03/2009), GERD (09/03/2006), GERD (gastroesophageal reflux disease), HEARING LOSS, SENSORINEURAL (05/12/2006), HEMATOCHEZIA (04/18/2008), HIP PAIN, BILATERAL (07/17/2008), History of depression, History of kidney stones, HIV (human immunodeficiency virus infection) ([place]) (1991), HLD (hyperlipidemia) (11/19/2013), Human immunodeficiency virus (HIV) disease ([place]) (05/12/2006), CABG (09/03/2006), Hyperkalemia (03/23/2014), Hyperlipidemia, Hypertension, Hyponatremia (03/23/2014), INGUINAL LYMPHADENOPATHY, RIGHT (04/03/2009), Keratoma (01/24/2015), KNEE PAIN, BILATERAL (07/17/2008), Lesion of breast (07/21/2015), Lipodystrophy (12/20/2010), Memory loss (09/18/2008), Metatarsal deformity (01/24/2015), Nasal abscess (12/04/2015), Night sweats (07/06/2012), Nocturia (03/06/2013), NSTEMI (non-ST elevated myocardial infarction) ([place]) (11/29/2016), Pain in joint, ankle and foot (01/19/2015), Pancreatitis (11/2013), Pedal edema (05/21/2014), PERIPHERAL VASCULAR DISEASE (07/20/2008), Posterior cervical lymphadenopathy (03/30/2014), Protein-calorie malnutrition, severe ([place]) (03/23/2014), SHINGLES, HX OF (05/03/2009),  STEMI (ST elevation myocardial infarction) ([place]) (07/29/2015), Unstable angina ([place]) (11/24/2016), and WEIGHT LOSS, ABNORMAL (04/03/2009).  He presents to the office today for follow up regarding left shoulder pain. He received a steroid injection into the left shoulder at his previous visit on 04/08/2018. He reports significant improvement since the steroid injection. Continues to have a small amount of discomfort when he raises his arm above his head. ROM has improved as well    Review of Systems See HPI   Past Medical History:  Diagnosis Date  . Absolute anemia 05/28/2014  . Aortic stenosis 09/17/2015  . Arthritis   . Ascites 11/18/2013  . BELLS PALSY 07/19/2010   Qualifier: Diagnosis of  By: [name], [name]    . CAD (coronary artery disease)    a. s/p CABG in 2003 b. s/p PCI to SVG-PDA in 07/2015 c. 11/2016: cath showing severe native CAD with patent LIMA-LAD and SVG-D1 with 80% stenosis of SVG-OM1-OM2. Initially medical management was recommended --> presented with recurrent angina --> s/p Synergy DES to proximal body of SVG-OM1-OM2, POBA to distal graft.   Marland Kitchen CAD- S/P PCI SVG-OM1 12/01/16 05/12/2006   Qualifier: Diagnosis of  By: [name], [name]    . Carotid bruit 07/10/2015  . Chest pain 11/22/2016  . Chronic kidney disease   . Chronic renal insufficiency, stage III (moderate) ([place]) 12/02/2016  . Constipation 05/28/2014  . COPD 07/20/2008   Qualifier: Diagnosis of  By: [name], [name]   . DEPRESSION 09/03/2006   Qualifier: Diagnosis of  By: [name], [name]    . Dermatitis 11/27/2012  . Diabetes mellitus   . Diarrhea 11/20/2013  . DM (diabetes mellitus), type 2 with ophthalmic complications ([place]) 1/88/4166   Qualifier: Diagnosis of  By: [name], [name]    . Dry eye syndrome 12/20/2010  . Epicondylitis 06/05/2013   right  . Erectile dysfunction 01/01/2012  . Essential hypertension 05/12/2006   Qualifier: Diagnosis of  By: [name], [name]    . GENITAL HERPES 05/03/2009   Qualifier:  Diagnosis of  By: [name], [name]    . GERD 09/03/2006   Qualifier: Diagnosis of  By: [name], [name]    .  GERD (gastroesophageal reflux disease)   . HEARING LOSS, SENSORINEURAL 05/12/2006   Qualifier: Diagnosis of  By: [name], [name]    . HEMATOCHEZIA 04/18/2008   Annotation: 9/09 during bout of constipation Qualifier: Diagnosis of  By: [name], [name]    . HIP PAIN, BILATERAL 07/17/2008   Qualifier: Diagnosis of  By: [name], [name]   . History of depression   . History of kidney stones   . HIV (human immunodeficiency virus infection) ([place]) 1991   on meds since initial dx.   Marland Kitchen HLD (hyperlipidemia) 11/19/2013  . Human immunodeficiency virus (HIV) disease ([place]) 05/12/2006   Qualifier: Diagnosis of  By: [name], [name]    . Hx of CABG 09/03/2006   Annotation: 2003 Qualifier: Diagnosis of  By: [name], [name]    . Hyperkalemia 03/23/2014  . Hyperlipidemia   . Hypertension   . Hyponatremia 03/23/2014  . INGUINAL LYMPHADENOPATHY, RIGHT 04/03/2009   Qualifier: Diagnosis of  By: [name], [name]    . Keratoma 01/24/2015  . KNEE PAIN, BILATERAL 07/17/2008   Qualifier: Diagnosis of  By: [name], [name]   . Lesion of breast 07/21/2015  . Lipodystrophy 12/20/2010  . Memory loss 09/18/2008   Qualifier: Diagnosis of  By: [name], [name]   . Metatarsal deformity 01/24/2015  . Nasal abscess 12/04/2015  . Night sweats 07/06/2012  . Nocturia 03/06/2013  . NSTEMI (non-ST elevated myocardial infarction) ([place]) 11/29/2016  . Pain in joint, ankle and foot 01/19/2015  . Pancreatitis 11/2013   attributed to HIV meds.   . Pedal edema 05/21/2014  . PERIPHERAL VASCULAR DISEASE 07/20/2008   Qualifier: Diagnosis of  By: [name], [name]   . Posterior cervical lymphadenopathy 03/30/2014  . Protein-calorie malnutrition, severe ([place]) 03/23/2014  . SHINGLES, HX OF 05/03/2009   Annotation: R leg Qualifier: Diagnosis of  By: [name], [name]    . STEMI (ST elevation myocardial infarction) ([place]) 07/29/2015  .  Unstable angina ([place]) 11/24/2016  . WEIGHT LOSS, ABNORMAL 04/03/2009   Qualifier: Diagnosis of  By: [name], [name]      Social History   Socioeconomic History  . Marital status: Divorced    Spouse name: Not on file  . Number of children: 4  . Years of education: Not on file  . Highest education level: Not on file  Occupational History  . 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   Social Needs  . Financial resource strain: Not on file  . Food insecurity:    Worry: Not on file    Inability: Not on file  . Transportation needs:    Medical: Not on file    Non-medical: Not on file  Tobacco Use  . Smoking status: Never Smoker  . Smokeless tobacco: Never Used  Substance and Sexual Activity  . Alcohol use: Yes    Alcohol/week: 0.0 standard drinks    Comment: rare  . Drug use: No  . Sexual activity: Not Currently    Comment: declined condoms  Lifestyle  . Physical activity:    Days per week: Not on file    Minutes per session: Not on file  . Stress: Not on file  Relationships  . Social connections:    Talks on phone: Not on file    Gets together: Not on file    Attends religious service: Not on file    Active member of club or organization: Not  on file    Attends meetings of clubs or organizations: Not on file    Relationship status: Not on file  . Intimate partner violence:    Fear of current or ex partner: Not on file    Emotionally abused: Not on file    Physically abused: Not on file    Forced sexual activity: Not on file  Other Topics Concern  . Not on file  Social History Narrative   Lives alone.  Supportive friends and family.  His HIV Dx is not a secret.     Past Surgical History:  Procedure Laterality Date  . CARDIAC CATHETERIZATION N/A 07/29/2015   Procedure: Left Heart Cath and Coronary Angiography;  Surgeon: [name], MD;  Location: [place] [facility];  Service: Cardiovascular;  Laterality:  N/A;  . CARDIAC CATHETERIZATION N/A 07/29/2015   Procedure: Coronary Stent Intervention;  Surgeon: [name], MD;  Location: [place] [facility];  Service: Cardiovascular;  Laterality: N/A;  . CORONARY ARTERY BYPASS GRAFT  05/2002  . CORONARY STENT INTERVENTION N/A 12/01/2016   Procedure: Coronary Stent Intervention;  Surgeon: [name], MD;  Location: [place] [facility];  Service: Cardiovascular;  Laterality: N/A;  . CORONARY STENT INTERVENTION N/A 10/21/2017   Procedure: CORONARY STENT INTERVENTION;  Surgeon: Jettie Booze, MD;  Location: [place] [facility];  Service: Cardiovascular;  Laterality: N/A;  . LEFT HEART CATH AND CORS/GRAFTS ANGIOGRAPHY N/A 11/24/2016   Procedure: Left Heart Cath and Cors/Grafts Angiography;  Surgeon: Nelva Bush, MD;  Location: [place] [facility];  Service: Cardiovascular;  Laterality: N/A;  . LEFT HEART CATH AND CORS/GRAFTS ANGIOGRAPHY N/A 12/01/2016   Procedure: Left Heart Cath and Cors/Grafts Angiography;  Surgeon: [name], MD;  Location: [facility];  Service: Cardiovascular;  Laterality: N/A;  . LEFT HEART CATH AND CORS/GRAFTS ANGIOGRAPHY N/A 10/21/2017   Procedure: LEFT HEART CATH AND CORS/GRAFTS ANGIOGRAPHY;  Surgeon: Jettie Booze, MD;  Location: [place] [facility];  Service: Cardiovascular;  Laterality: N/A;  . VASECTOMY      Family History  Problem Relation Age of Onset  . Hyperlipidemia Mother   . Diabetes Mother        paternal grandparents/1 brother  . Hyperlipidemia Father   . Hypertension Father        paternal grandmother/3 brothers/1 sister  . Arthritis Unknown        mother/father/paternal grandparents  . Breast cancer Maternal Aunt        paternal aunt  . Lung cancer Maternal Aunt   . Heart disease Unknown        parents/maternal grandparents/ 2 brothers  . Stroke Paternal Grandmother   . Mental retardation Sister   . Colon cancer Neg Hx   . Esophageal cancer Neg Hx   . Pancreatic cancer Neg  Hx   . Stomach cancer Neg Hx   . Liver disease Neg Hx     No Known Allergies  Current Outpatient Medications on File Prior to Visit  Medication Sig Dispense Refill  . acetaminophen (TYLENOL) 325 MG tablet Take 2 tablets (650 mg total) by mouth every 4 (four) hours as needed for headache or mild pain.    Marland Kitchen aspirin 81 MG chewable tablet Chew 81 mg by mouth daily.    Marland Kitchen atorvastatin (LIPITOR) 20 MG tablet Take 1 tablet (20 mg total) by mouth daily. 90 tablet 3  . b complex vitamins tablet Take 1 tablet by mouth daily. 30 tablet 11  .  Cholecalciferol 2000 units CAPS Take 1 capsule (2,000 Units total) by mouth daily. 30 each 11  . clopidogrel (PLAVIX) 75 MG tablet Take 1 tablet (75 mg total) by mouth daily. 30 tablet 11  . cyclobenzaprine (FLEXERIL) 10 MG tablet Take 1 tablet (10 mg total) by mouth at bedtime. 15 tablet 0  . dolutegravir (TIVICAY) 50 MG tablet Take 1 tablet (50 mg total) by mouth daily. 30 tablet 11  . doravirine (PIFELTRO) 100 MG TABS tablet Take 1 tablet (100 mg total) by mouth daily. 30 tablet 11  . fenofibrate 54 MG tablet Take 1 tablet (54 mg total) by mouth daily. 90 tablet 3  . FREESTYLE TEST STRIPS test strip USE TO CHECK BLOOD SUGAR THREE TIMES DAILY 300 each 3  . furosemide (LASIX) 20 MG tablet TAKE 2 TABLETS BY MOUTH DAILY AS NEEDED FOR FLUID RETENTIONOR SWELLING 90 tablet 0  . isosorbide mononitrate (IMDUR) 30 MG 24 hr tablet TAKE 1 TABLET(30 MG) BY MOUTH DAILY (Patient taking differently: 15mg  by mouth twice daily) 90 tablet 2  . Lancets (FREESTYLE) lancets Use as directed three times a day to check blood sugar.  DX E11.9 100 each 5  . lisinopril (PRINIVIL,ZESTRIL) 5 MG tablet Take 1 tablet (5 mg total) by mouth daily. 90 tablet 3  . metFORMIN (GLUCOPHAGE-XR) 500 MG 24 hr tablet Take 1 tablet (500 mg total) by mouth daily with supper. 30 tablet 3  . metoprolol tartrate (LOPRESSOR) 25 MG tablet TAKE 1/2 TABLET BY MOUTH TWICE DAILY 60 tablet 11  .  neomycin-polymyxin-hydrocortisone (CORTISPORIN) otic solution INSTILL 3 DROPS IN BOTH EARS THREE TIMES DAILY (Patient taking differently: 3 drops into both ears daily as needed) 10 mL 0  . nitroGLYCERIN (NITROSTAT) 0.4 MG SL tablet Place 1 tablet (0.4 mg total) under the tongue every 5 (five) minutes as needed for chest pain. 25 tablet 3  . pantoprazole (PROTONIX) 40 MG tablet Take 1 tablet (40 mg total) by mouth daily. 30 tablet 5  . pioglitazone (ACTOS) 15 MG tablet TAKE 1 TABLET(15 MG) BY MOUTH DAILY 30 tablet 3  . potassium chloride SA (K-DUR,KLOR-CON) 20 MEQ tablet Take 1 tablet (20 mEq total) by mouth 3 (three) times daily. 270 tablet 3  . Probiotic Product (PROBIOTIC DAILY) CAPS Take 1 by mouth daily 30 capsule 11  . repaglinide (PRANDIN) 2 MG tablet TAKE 2 TABLETS BY MOUTH BEFORE BREAKFAST, 1 TABLET BY MOUTH BEFORE LUNCH AND 2 TABLETS BY MOUTH BEFORE SUPPER 450 tablet 3  . sodium bicarbonate 650 MG tablet Take 650 mg by mouth 3 (three) times daily.    Marland Kitchen triamcinolone cream (KENALOG) 0.1 % apply twice daily as needed to dry, itchy skin  2  . valACYclovir (VALTREX) 500 MG tablet TAKE 1 TABLET BY MOUTH DAILY 30 tablet 3  . VASCEPA 1 g CAPS TAKE 2 CAPSULES(2 GRAMS) BY MOUTH TWICE DAILY 120 capsule 0   No current facility-administered medications on file prior to visit.     BP 110/60 (BP Location: Right Arm, Patient Position: Sitting, Cuff Size: Normal)   Pulse 78   Temp 98.3 F (36.8 C) (Oral)   Ht 5\' 3"  (1.6 m)   Wt 144 lb 9.6 oz (65.6 kg)   BMI 25.61 kg/m       Objective:   Physical Exam  Constitutional: He is oriented to person, place, and time. He appears well-developed and well-nourished. No distress.  Musculoskeletal: Normal range of motion. He exhibits no edema, tenderness or deformity.  Neurological: He is  alert and oriented to person, place, and time.  Skin: Skin is warm and dry. He is not diaphoretic.  Psychiatric: He has a normal mood and affect. His behavior is normal.  Judgment and thought content normal.  Nursing note and vitals reviewed.     Assessment & Plan:  1. Acute pain of left shoulder - Nearly resolved  - Continue to monitor  - Advised follow up if pain comes back   Dorothyann Peng, NP

## 2018-04-30 NOTE — Addendum Note (Signed)
Addended by: Agnes Lawrence on: 04/30/2018 11:41 AM   Modules accepted: Orders

## 2018-05-11 ENCOUNTER — Other Ambulatory Visit: Payer: Self-pay | Admitting: Endocrinology

## 2018-05-19 ENCOUNTER — Other Ambulatory Visit: Payer: Self-pay | Admitting: Endocrinology

## 2018-05-19 DIAGNOSIS — N401 Enlarged prostate with lower urinary tract symptoms: Secondary | ICD-10-CM | POA: Diagnosis not present

## 2018-05-19 DIAGNOSIS — R3912 Poor urinary stream: Secondary | ICD-10-CM | POA: Diagnosis not present

## 2018-05-19 DIAGNOSIS — R350 Frequency of micturition: Secondary | ICD-10-CM | POA: Diagnosis not present

## 2018-05-27 ENCOUNTER — Encounter: Payer: Self-pay | Admitting: Podiatry

## 2018-05-27 ENCOUNTER — Ambulatory Visit (INDEPENDENT_AMBULATORY_CARE_PROVIDER_SITE_OTHER): Payer: Medicare Other | Admitting: Podiatry

## 2018-05-27 ENCOUNTER — Other Ambulatory Visit: Payer: Self-pay

## 2018-05-27 VITALS — BP 168/84 | HR 63

## 2018-05-27 DIAGNOSIS — E1151 Type 2 diabetes mellitus with diabetic peripheral angiopathy without gangrene: Secondary | ICD-10-CM | POA: Diagnosis not present

## 2018-05-27 DIAGNOSIS — B351 Tinea unguium: Secondary | ICD-10-CM

## 2018-05-27 DIAGNOSIS — M79675 Pain in left toe(s): Secondary | ICD-10-CM | POA: Diagnosis not present

## 2018-05-27 DIAGNOSIS — M79674 Pain in right toe(s): Secondary | ICD-10-CM | POA: Diagnosis not present

## 2018-05-27 DIAGNOSIS — L84 Corns and callosities: Secondary | ICD-10-CM | POA: Diagnosis not present

## 2018-05-27 NOTE — Patient Instructions (Addendum)

## 2018-06-04 ENCOUNTER — Telehealth: Payer: Self-pay

## 2018-06-04 ENCOUNTER — Other Ambulatory Visit (INDEPENDENT_AMBULATORY_CARE_PROVIDER_SITE_OTHER): Payer: Medicare Other

## 2018-06-04 DIAGNOSIS — E1165 Type 2 diabetes mellitus with hyperglycemia: Secondary | ICD-10-CM

## 2018-06-04 LAB — HEMOGLOBIN A1C: Hgb A1c MFr Bld: 7.1 % — ABNORMAL HIGH (ref 4.6–6.5)

## 2018-06-04 LAB — BASIC METABOLIC PANEL
BUN: 26 mg/dL — ABNORMAL HIGH (ref 6–23)
CHLORIDE: 112 meq/L (ref 96–112)
CO2: 21 meq/L (ref 19–32)
CREATININE: 1.69 mg/dL — AB (ref 0.40–1.50)
Calcium: 8.5 mg/dL (ref 8.4–10.5)
GFR: 42.85 mL/min — ABNORMAL LOW (ref >60.00)
Glucose, Bld: 124 mg/dL — ABNORMAL HIGH (ref 70–99)
Potassium: 4.6 mEq/L (ref 3.5–5.1)
Sodium: 143 mEq/L (ref 135–145)

## 2018-06-08 ENCOUNTER — Encounter: Payer: Self-pay | Admitting: Endocrinology

## 2018-06-08 ENCOUNTER — Ambulatory Visit (INDEPENDENT_AMBULATORY_CARE_PROVIDER_SITE_OTHER): Payer: Medicare Other | Admitting: Endocrinology

## 2018-06-08 VITALS — BP 110/60 | HR 73 | Ht 63.0 in | Wt 145.0 lb

## 2018-06-08 DIAGNOSIS — I25810 Atherosclerosis of coronary artery bypass graft(s) without angina pectoris: Secondary | ICD-10-CM | POA: Diagnosis not present

## 2018-06-08 DIAGNOSIS — E1165 Type 2 diabetes mellitus with hyperglycemia: Secondary | ICD-10-CM

## 2018-06-08 MED ORDER — METFORMIN HCL ER 500 MG PO TB24: 500.0000 mg | ORAL_TABLET | Freq: Every day | ORAL | 3 refills | Status: DC

## 2018-06-08 NOTE — Patient Instructions (Signed)
Check blood sugars on waking up 2 days a week  Also check blood sugars about 2 hours after meals and do this after different meals by rotation  Recommended blood sugar levels on waking up are 90-130 and about 2 hours after meal is 130-160  Please bring your blood sugar monitor to each visit, thank you   

## 2018-06-08 NOTE — Progress Notes (Signed)
Patient ID: [name], male   DOB: 01/28/1946, 72 y.o.   MRN: [id number]           Reason for Appointment: Follow-up for Type 2 Diabetes  Referring physician: Charlett Blake  History of Present Illness:          Date of diagnosis of type 2 diabetes mellitus: 2013        Background history:  He only mildly increased blood sugar levels at the time of diagnosis and A1c was 7.1  He had been treated initially with metformin but this was stopped subsequently when his renal function was worse  He was subsequently treated with glipizide but his blood sugars had been poorly controlled in 2016 with glipizide alone Since his A1c has been persistently over 9% he was started on Januvia 50 mg in 01/2015 At initial consultation he had a high A1c of 9.5; he was switched from glipizide to Prandin in 02/2015 Previously when he had high fasting readings he was started on Toujeo on 06/11/16, however this was stopped in April 2018 when he was getting hypoglycemia  Recent history:    Oral hypoglycemic drugs: Prandin 4 mg at breakfast  and 2 at dinner and 2 mg before lunch , Actos 15 mg daily, metformin ER 500 mg daily   His A1c is again 7.1, previous range 6.3-7.6  Current blood sugar patterns and problems identified:  He is still forgetting to check his blood sugars and has started checking only last week with 4 readings in the mornings and low readings after meals  He is not complaining of any hypoglycemic symptoms at any time recently  His weight is about the same  He takes a pregnant fairly regularly before his meals as directed  No edema with taking Actos  He says he is not walking outside because of not being in a safe neighborhood but is now trying to go to shopping area to walk a couple of times a week   Side effects from medications have been: None   Glucose monitoring:  done usually 0-1  times a day         Glucometer:  FreeStyle     Blood Glucose readings Has fasting readings ranging  from 73-132 recently   Dietician visit, most recent: 8/15 DIET:  has been trying to reduce fried/usually not eating high fat  foods; eating oatmeal in the morning+ greek yogurt               Dinner usually at 6 pm Exercise:  walking 0-2 miles, 2/7 days  .  Weight history:   Wt Readings from Last 3 Encounters:  06/08/18 145 lb (65.8 kg)  04/30/18 144 lb 9.6 oz (65.6 kg)  04/08/18 147 lb (66.7 kg)    Glycemic control:   Lab Results  Component Value Date   HGBA1C 7.1 (H) 06/04/2018   HGBA1C 7.1 (H) 02/22/2018   HGBA1C 7.1 (H) 10/15/2017   Lab Results  Component Value Date   MICROALBUR 3.4 (H) 07/10/2017   LDLCALC 60 02/22/2018   CREATININE 1.69 (H) 06/04/2018    Other active problems: See review of systems   Lab on 06/04/2018  Component Date Value Ref Range Status  . Sodium 06/04/2018 143  135 - 145 mEq/L Final  . Potassium 06/04/2018 4.6  3.5 - 5.1 mEq/L Final  . Chloride 06/04/2018 112  96 - 112 mEq/L Final  . CO2 06/04/2018 21  19 - 32 mEq/L Final  . Glucose, Bld 06/04/2018  124* 70 - 99 mg/dL Final  . BUN 06/04/2018 26* 6 - 23 mg/dL Final  . Creatinine, Ser 06/04/2018 1.69* 0.40 - 1.50 mg/dL Final  . Calcium 06/04/2018 8.5  8.4 - 10.5 mg/dL Final  . GFR 06/04/2018 42.85* >60.00 mL/min Final  . Hgb A1c MFr Bld 06/04/2018 7.1* 4.6 - 6.5 % Final   Glycemic Control Guidelines for People with Diabetes:Non Diabetic:  <6%Goal of Therapy: <7%Additional Action Suggested:  >8%        Allergies as of 06/08/2018   No Known Allergies     Medication List        Accurate as of 06/08/18 10:41 AM. Always use your most recent med list.          acetaminophen 325 MG tablet Commonly known as:  TYLENOL Take 2 tablets (650 mg total) by mouth every 4 (four) hours as needed for headache or mild pain.   aspirin 81 MG chewable tablet Chew 81 mg by mouth daily.   atorvastatin 20 MG tablet Commonly known as:  LIPITOR Take 1 tablet (20 mg total) by mouth daily.   b  complex vitamins tablet Take 1 tablet by mouth daily.   Cholecalciferol 2000 units Caps Take 1 capsule (2,000 Units total) by mouth daily.   clopidogrel 75 MG tablet Commonly known as:  PLAVIX Take 1 tablet (75 mg total) by mouth daily.   dolutegravir 50 MG tablet Commonly known as:  TIVICAY Take 1 tablet (50 mg total) by mouth daily.   doravirine 100 MG Tabs tablet Commonly known as:  PIFELTRO Take 1 tablet (100 mg total) by mouth daily.   fenofibrate 54 MG tablet Take 1 tablet (54 mg total) by mouth daily.   freestyle lancets Use as directed three times a day to check blood sugar.  DX E11.9   FREESTYLE TEST STRIPS test strip Generic drug:  glucose blood USE TO CHECK BLOOD SUGAR THREE TIMES DAILY   furosemide 20 MG tablet Commonly known as:  LASIX TAKE 2 TABLETS BY MOUTH DAILY AS NEEDED FOR FLUID RETENTIONOR SWELLING   isosorbide mononitrate 30 MG 24 hr tablet Commonly known as:  IMDUR TAKE 1 TABLET(30 MG) BY MOUTH DAILY   lisinopril 5 MG tablet Commonly known as:  PRINIVIL,ZESTRIL Take 1 tablet (5 mg total) by mouth daily.   metFORMIN 500 MG 24 hr tablet Commonly known as:  GLUCOPHAGE-XR Take 1 tablet (500 mg total) by mouth daily with supper.   metoprolol tartrate 25 MG tablet Commonly known as:  LOPRESSOR TAKE 1/2 TABLET BY MOUTH TWICE DAILY   neomycin-polymyxin-hydrocortisone OTIC solution Commonly known as:  CORTISPORIN INSTILL 3 DROPS IN BOTH EARS THREE TIMES DAILY   nitroGLYCERIN 0.4 MG SL tablet Commonly known as:  NITROSTAT Place 1 tablet (0.4 mg total) under the tongue every 5 (five) minutes as needed for chest pain.   pantoprazole 40 MG tablet Commonly known as:  PROTONIX Take 1 tablet (40 mg total) by mouth daily.   pioglitazone 15 MG tablet Commonly known as:  ACTOS TAKE 1 TABLET(15 MG) BY MOUTH DAILY   potassium chloride SA 20 MEQ tablet Commonly known as:  K-DUR,KLOR-CON Take 1 tablet (20 mEq total) by mouth 3 (three) times daily.     PROBIOTIC DAILY Caps Take 1 by mouth daily   repaglinide 2 MG tablet Commonly known as:  PRANDIN TAKE 2 TABLETS BY MOUTH BEFORE BREAKFAST, 1 TABLET BY MOUTH BEFORE LUNCH AND 2 TABLETS BY MOUTH BEFORE SUPPER   sodium bicarbonate 650 MG  tablet Take 650 mg by mouth 3 (three) times daily.   tamsulosin 0.4 MG Caps capsule Commonly known as:  FLOMAX TK 1 C PO BID   triamcinolone cream 0.1 % Commonly known as:  KENALOG apply twice daily as needed to dry, itchy skin   valACYclovir 500 MG tablet Commonly known as:  VALTREX TAKE 1 TABLET BY MOUTH DAILY   VASCEPA 1 g Caps Generic drug:  Icosapent Ethyl TAKE 2 CAPSULES(2 GRAMS) BY MOUTH TWICE DAILY       Allergies: No Known Allergies  Past Medical History:  Diagnosis Date  . Absolute anemia 05/28/2014  . Aortic stenosis 09/17/2015  . Arthritis   . Ascites 11/18/2013  . BELLS PALSY 07/19/2010   Qualifier: Diagnosis of  By: [name], [name]    . CAD (coronary artery disease)    a. s/p CABG in 2003 b. s/p PCI to SVG-PDA in 07/2015 c. 11/2016: cath showing severe native CAD with patent LIMA-LAD and SVG-D1 with 80% stenosis of SVG-OM1-OM2. Initially medical management was recommended --> presented with recurrent angina --> s/p Synergy DES to proximal body of SVG-OM1-OM2, POBA to distal graft.   Marland Kitchen CAD- S/P PCI SVG-OM1 12/01/16 05/12/2006   Qualifier: Diagnosis of  By: [name], [name]    . Carotid bruit 07/10/2015  . Chest pain 11/22/2016  . Chronic kidney disease   . Chronic renal insufficiency, stage III (moderate) ([place]) 12/02/2016  . Constipation 05/28/2014  . COPD 07/20/2008   Qualifier: Diagnosis of  By: [name], [name]   . DEPRESSION 09/03/2006   Qualifier: Diagnosis of  By: [name], [name]    . Dermatitis 11/27/2012  . Diabetes mellitus   . Diarrhea 11/20/2013  . DM (diabetes mellitus), type 2 with ophthalmic complications ([place]) 6/96/7893   Qualifier: Diagnosis of  By: [name], [name]    . Dry eye syndrome 12/20/2010  .  Epicondylitis 06/05/2013   right  . Erectile dysfunction 01/01/2012  . Essential hypertension 05/12/2006   Qualifier: Diagnosis of  By: [name], [name]    . GENITAL HERPES 05/03/2009   Qualifier: Diagnosis of  By: [name], [name]    . GERD 09/03/2006   Qualifier: Diagnosis of  By: [name], [name]    . GERD (gastroesophageal reflux disease)   . HEARING LOSS, SENSORINEURAL 05/12/2006   Qualifier: Diagnosis of  By: [name], [name]    . HEMATOCHEZIA 04/18/2008   Annotation: 9/09 during bout of constipation Qualifier: Diagnosis of  By: [name], [name]    . HIP PAIN, BILATERAL 07/17/2008   Qualifier: Diagnosis of  By: [name], [name]   . History of depression   . History of kidney stones   . HIV (human immunodeficiency virus infection) ([place]) 1991   on meds since initial dx.   Marland Kitchen HLD (hyperlipidemia) 11/19/2013  . Human immunodeficiency virus (HIV) disease ([place]) 05/12/2006   Qualifier: Diagnosis of  By: [name], [name]    . Hx of CABG 09/03/2006   Annotation: 2003 Qualifier: Diagnosis of  By: [name], [name]    . Hyperkalemia 03/23/2014  . Hyperlipidemia   . Hypertension   . Hyponatremia 03/23/2014  . INGUINAL LYMPHADENOPATHY, RIGHT 04/03/2009   Qualifier: Diagnosis of  By: [name], [name]    . Keratoma 01/24/2015  . KNEE PAIN, BILATERAL 07/17/2008   Qualifier: Diagnosis of  By: [name], [name]   . Lesion of breast 07/21/2015  . Lipodystrophy 12/20/2010  . Memory loss 09/18/2008   Qualifier:  Diagnosis of  By: [name], [place] deformity 01/24/2015  . Nasal abscess 12/04/2015  . Night sweats 07/06/2012  . Nocturia 03/06/2013  . NSTEMI (non-ST elevated myocardial infarction) ([place]) 11/29/2016  . Pain in joint, ankle and foot 01/19/2015  . Pancreatitis 11/2013   attributed to HIV meds.   . Pedal edema 05/21/2014  . PERIPHERAL VASCULAR DISEASE 07/20/2008   Qualifier: Diagnosis of  By: [name], [name]   . Posterior cervical lymphadenopathy 03/30/2014  . Protein-calorie  malnutrition, severe ([place]) 03/23/2014  . SHINGLES, HX OF 05/03/2009   Annotation: R leg Qualifier: Diagnosis of  By: [name], [name]    . STEMI (ST elevation myocardial infarction) ([place]) 07/29/2015  . Unstable angina ([place]) 11/24/2016  . WEIGHT LOSS, ABNORMAL 04/03/2009   Qualifier: Diagnosis of  By: [name], [name]      Past Surgical History:  Procedure Laterality Date  . CARDIAC CATHETERIZATION N/A 07/29/2015   Procedure: Left Heart Cath and Coronary Angiography;  Surgeon: [name], MD;  Location: [facility];  Service: Cardiovascular;  Laterality: N/A;  . CARDIAC CATHETERIZATION N/A 07/29/2015   Procedure: Coronary Stent Intervention;  Surgeon: [name], MD;  Location: [facility];  Service: Cardiovascular;  Laterality: N/A;  . CORONARY ARTERY BYPASS GRAFT  05/2002  . CORONARY STENT INTERVENTION N/A 12/01/2016   Procedure: Coronary Stent Intervention;  Surgeon: [name], MD;  Location: [facility];  Service: Cardiovascular;  Laterality: N/A;  . CORONARY STENT INTERVENTION N/A 10/21/2017   Procedure: CORONARY STENT INTERVENTION;  Surgeon: Jettie Booze, MD;  Location: [facility];  Service: Cardiovascular;  Laterality: N/A;  . LEFT HEART CATH AND CORS/GRAFTS ANGIOGRAPHY N/A 11/24/2016   Procedure: Left Heart Cath and Cors/Grafts Angiography;  Surgeon: Nelva Bush, MD;  Location: [facility];  Service: Cardiovascular;  Laterality: N/A;  . LEFT HEART CATH AND CORS/GRAFTS ANGIOGRAPHY N/A 12/01/2016   Procedure: Left Heart Cath and Cors/Grafts Angiography;  Surgeon: [name], MD;  Location: [facility];  Service: Cardiovascular;  Laterality: N/A;  . LEFT HEART CATH AND CORS/GRAFTS ANGIOGRAPHY N/A 10/21/2017   Procedure: LEFT HEART CATH AND CORS/GRAFTS ANGIOGRAPHY;  Surgeon: Jettie Booze, MD;  Location: [facility];  Service: Cardiovascular;  Laterality: N/A;  . VASECTOMY      Family History  Problem  Relation Age of Onset  . Hyperlipidemia Mother   . Diabetes Mother        paternal grandparents/1 brother  . Hyperlipidemia Father   . Hypertension Father        paternal grandmother/3 brothers/1 sister  . Arthritis Unknown        mother/father/paternal grandparents  . Breast cancer Maternal Aunt        paternal aunt  . Lung cancer Maternal Aunt   . Heart disease Unknown        parents/maternal grandparents/ 2 brothers  . Stroke Paternal Grandmother   . Mental retardation Sister   . Colon cancer Neg Hx   . Esophageal cancer Neg Hx   . Pancreatic cancer Neg Hx   . Stomach cancer Neg Hx   . Liver disease Neg Hx     Social History:  reports that he has never smoked. He has never used smokeless tobacco. He reports that he drinks alcohol. He reports that he does not use drugs.    Review of Systems    Lipid history: On Lipitor  40 mg, followed by PCP His triglycerides were nearly 410 March and also taking Vascepa with excellent control  Also has significant history of CAD   Lab Results  Component Value Date   CHOL 109 02/22/2018   HDL 28.70 (L) 02/22/2018   LDLCALC 60 02/22/2018   LDLDIRECT 72.0 11/04/2016   TRIG 103.0 02/22/2018   CHOLHDL 4 02/22/2018            RENAL dysfunction: Followed by nephrologist, overall stable but variable  Lab Results  Component Value Date   CREATININE 1.69 (H) 06/04/2018   CREATININE 1.81 (H) 02/22/2018   CREATININE 1.67 (H) 01/07/2018      Hypertension:taking 5 mg lisinopril with 25 mg metoprolol  Followed by cardiologist and blood pressure is usually low normal  BP Readings from Last 3 Encounters:  06/08/18 110/60  05/27/18 (!) 168/84  04/30/18 110/60    Diabetic foot exam  in 5/19 shows normal monofilament sensation in the toes and plantar surfaces, no skin lesions or ulcers on the feet and absent pedal pulses    Physical Examination:  BP 110/60   Pulse 73   Ht 5\' 3"  (1.6 m)   Wt 145 lb (65.8 kg)   SpO2 97%   BMI  25.69 kg/m   No pedal edema  ASSESSMENT:  Diabetes type 2, with BMI 25; has  abdominal obesity   See history of present illness for detailed discussion of current diabetes management, blood sugar patterns and problems identified  He has a stable A1c of 7.1   Is not checking his blood sugars as he is not motivated and forgets Reminded him to start checking blood sugars more regularly after meals Only last few days and started doing some fasting readings which are fairly good Previously has had high postprandial readings which are being controlled with Prandin but may have variable readings No hypoglycemia with 3 times a day Prandin Recently has started to do a little more walking and encouraged him to do more However overall control is good considering his age and comorbid conditions   Neuropathy: No symptoms currently  RENAL insufficiency: Variable and needs to be followed by nephrology consistently    PLAN:  As above With his renal function being borderline increase his metformin especially since fasting readings are fairly good He can go to indoor locations especially shopping areas for more regular walking at least every other day No change in medication doses Also needs to continue his lipid-lowering drugs as before    There are no Patient Instructions on file for this visit.   Elayne Snare 06/08/2018, 10:41 AM   Note: This office note was prepared with Dragon voice recognition system technology. Any transcriptional errors that result from this process are unintentional.

## 2018-06-10 ENCOUNTER — Other Ambulatory Visit: Payer: Self-pay | Admitting: Endocrinology

## 2018-06-10 ENCOUNTER — Encounter: Payer: Self-pay | Admitting: Adult Health

## 2018-06-10 ENCOUNTER — Encounter: Payer: Self-pay | Admitting: Podiatry

## 2018-06-10 ENCOUNTER — Ambulatory Visit (INDEPENDENT_AMBULATORY_CARE_PROVIDER_SITE_OTHER): Payer: Medicare Other | Admitting: Adult Health

## 2018-06-10 VITALS — BP 132/82 | HR 73 | Temp 98.0°F | Resp 16 | Ht 63.0 in | Wt 147.1 lb

## 2018-06-10 DIAGNOSIS — M25511 Pain in right shoulder: Secondary | ICD-10-CM

## 2018-06-10 DIAGNOSIS — I25810 Atherosclerosis of coronary artery bypass graft(s) without angina pectoris: Secondary | ICD-10-CM

## 2018-06-10 NOTE — Progress Notes (Signed)
Subjective:    Patient ID: [name], male    DOB: 1946/03/08, 72 y.o.   MRN: [id number]  HPI 72 year old male who  has a past medical history of Absolute anemia (05/28/2014), Aortic stenosis (09/17/2015), Arthritis, Ascites (11/18/2013), BELLS PALSY (07/19/2010), CAD (coronary artery disease), CAD- S/P PCI SVG-OM1 12/01/16 (05/12/2006), Carotid bruit (07/10/2015), Chest pain (11/22/2016), Chronic kidney disease, Chronic renal insufficiency, stage III (moderate) (HCC) (12/02/2016), Constipation (05/28/2014), COPD (07/20/2008), DEPRESSION (09/03/2006), Dermatitis (11/27/2012), Diabetes mellitus, Diarrhea (11/20/2013), DM (diabetes mellitus), type 2 with ophthalmic complications ([place]) (4/69/6295), Dry eye syndrome (12/20/2010), Epicondylitis (06/05/2013), Erectile dysfunction (01/01/2012), Essential hypertension (05/12/2006), GENITAL HERPES (05/03/2009), GERD (09/03/2006), GERD (gastroesophageal reflux disease), HEARING LOSS, SENSORINEURAL (05/12/2006), HEMATOCHEZIA (04/18/2008), HIP PAIN, BILATERAL (07/17/2008), History of depression, History of kidney stones, HIV (human immunodeficiency virus infection) ([place]) (1991), HLD (hyperlipidemia) (11/19/2013), Human immunodeficiency virus (HIV) disease ([place]) (05/12/2006), CABG (09/03/2006), Hyperkalemia (03/23/2014), Hyperlipidemia, Hypertension, Hyponatremia (03/23/2014), INGUINAL LYMPHADENOPATHY, RIGHT (04/03/2009), Keratoma (01/24/2015), KNEE PAIN, BILATERAL (07/17/2008), Lesion of breast (07/21/2015), Lipodystrophy (12/20/2010), Memory loss (09/18/2008), Metatarsal deformity (01/24/2015), Nasal abscess (12/04/2015), Night sweats (07/06/2012), Nocturia (03/06/2013), NSTEMI (non-ST elevated myocardial infarction) ([place]) (11/29/2016), Pain in joint, ankle and foot (01/19/2015), Pancreatitis (11/2013), Pedal edema (05/21/2014), PERIPHERAL VASCULAR DISEASE (07/20/2008), Posterior cervical lymphadenopathy (03/30/2014), Protein-calorie malnutrition, severe ([place]) (03/23/2014), SHINGLES, HX OF (05/03/2009),  STEMI (ST elevation myocardial infarction) ([place]) (07/29/2015), Unstable angina ([place]) (11/24/2016), and WEIGHT LOSS, ABNORMAL (04/03/2009).  He presents to the office today for continued left shoulder pain. Pain is worse with certain movements such as bring his arm above his head and trying to scratch his back. He is able to perform these movements buts gets a " dull pain" when he tries to do so. Denies any changes in grip strength   Most recent xray shows minimal inferior glenohumeral and acromioclavicular spur formation.   Discomfort does not prevent him from doing ADLs.   Review of Systems See HPI   Past Medical History:  Diagnosis Date  . Absolute anemia 05/28/2014  . Aortic stenosis 09/17/2015  . Arthritis   . Ascites 11/18/2013  . BELLS PALSY 07/19/2010   Qualifier: Diagnosis of  By: [name], [name]    . CAD (coronary artery disease)    a. s/p CABG in 2003 b. s/p PCI to SVG-PDA in 07/2015 c. 11/2016: cath showing severe native CAD with patent LIMA-LAD and SVG-D1 with 80% stenosis of SVG-OM1-OM2. Initially medical management was recommended --> presented with recurrent angina --> s/p Synergy DES to proximal body of SVG-OM1-OM2, POBA to distal graft.   Marland Kitchen CAD- S/P PCI SVG-OM1 12/01/16 05/12/2006   Qualifier: Diagnosis of  By: [name], [name]    . Carotid bruit 07/10/2015  . Chest pain 11/22/2016  . Chronic kidney disease   . Chronic renal insufficiency, stage III (moderate) ([place]) 12/02/2016  . Constipation 05/28/2014  . COPD 07/20/2008   Qualifier: Diagnosis of  By: [name], [name]   . DEPRESSION 09/03/2006   Qualifier: Diagnosis of  By: [name], [name]    . Dermatitis 11/27/2012  . Diabetes mellitus   . Diarrhea 11/20/2013  . DM (diabetes mellitus), type 2 with ophthalmic complications ([place]) 2/84/1324   Qualifier: Diagnosis of  By: [name], [name]    . Dry eye syndrome 12/20/2010  . Epicondylitis 06/05/2013   right  . Erectile dysfunction 01/01/2012  . Essential hypertension  05/12/2006   Qualifier: Diagnosis of  By: [name], [name]    . GENITAL HERPES 05/03/2009   Qualifier: Diagnosis of  By: [name], [name]    . GERD 09/03/2006   Qualifier: Diagnosis of  By: [name], [name]    . GERD (gastroesophageal reflux disease)   . HEARING LOSS, SENSORINEURAL 05/12/2006   Qualifier: Diagnosis of  By: [name], [name]    . HEMATOCHEZIA 04/18/2008   Annotation: 9/09 during bout of constipation Qualifier: Diagnosis of  By: [name], [name]    . HIP PAIN, BILATERAL 07/17/2008   Qualifier: Diagnosis of  By: [name], [name]   . History of depression   . History of kidney stones   . HIV (human immunodeficiency virus infection) ([place]) 1991   on meds since initial dx.   Marland Kitchen HLD (hyperlipidemia) 11/19/2013  . Human immunodeficiency virus (HIV) disease ([place]) 05/12/2006   Qualifier: Diagnosis of  By: [name], [name]    . Hx of CABG 09/03/2006   Annotation: 2003 Qualifier: Diagnosis of  By: [name], [name]    . Hyperkalemia 03/23/2014  . Hyperlipidemia   . Hypertension   . Hyponatremia 03/23/2014  . INGUINAL LYMPHADENOPATHY, RIGHT 04/03/2009   Qualifier: Diagnosis of  By: [name], [name]    . Keratoma 01/24/2015  . KNEE PAIN, BILATERAL 07/17/2008   Qualifier: Diagnosis of  By: [name], [name]   . Lesion of breast 07/21/2015  . Lipodystrophy 12/20/2010  . Memory loss 09/18/2008   Qualifier: Diagnosis of  By: [name], [name]   . Metatarsal deformity 01/24/2015  . Nasal abscess 12/04/2015  . Night sweats 07/06/2012  . Nocturia 03/06/2013  . NSTEMI (non-ST elevated myocardial infarction) ([place]) 11/29/2016  . Pain in joint, ankle and foot 01/19/2015  . Pancreatitis 11/2013   attributed to HIV meds.   . Pedal edema 05/21/2014  . PERIPHERAL VASCULAR DISEASE 07/20/2008   Qualifier: Diagnosis of  By: [name], [name]   . Posterior cervical lymphadenopathy 03/30/2014  . Protein-calorie malnutrition, severe ([place]) 03/23/2014  . SHINGLES, HX OF 05/03/2009   Annotation: R leg Qualifier:  Diagnosis of  By: [name], [name]    . STEMI (ST elevation myocardial infarction) ([place]) 07/29/2015  . Unstable angina ([place]) 11/24/2016  . WEIGHT LOSS, ABNORMAL 04/03/2009   Qualifier: Diagnosis of  By: [name], [name]      Social History   Socioeconomic History  . Marital status: Divorced    Spouse name: Not on file  . Number of children: 4  . Years of education: Not on file  . Highest education level: Not on file  Occupational History  . 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   Social Needs  . Financial resource strain: Not on file  . Food insecurity:    Worry: Not on file    Inability: Not on file  . Transportation needs:    Medical: Not on file    Non-medical: Not on file  Tobacco Use  . Smoking status: Never Smoker  . Smokeless tobacco: Never Used  Substance and Sexual Activity  . Alcohol use: Yes    Alcohol/week: 0.0 standard drinks    Comment: rare  . Drug use: No  . Sexual activity: Not Currently    Comment: declined condoms  Lifestyle  . Physical activity:    Days per week: Not on file    Minutes per session: Not on file  . Stress: Not on file  Relationships  . Social connections:    Talks on phone: Not on file  Gets together: Not on file    Attends religious service: Not on file    Active member of club or organization: Not on file    Attends meetings of clubs or organizations: Not on file    Relationship status: Not on file  . Intimate partner violence:    Fear of current or ex partner: Not on file    Emotionally abused: Not on file    Physically abused: Not on file    Forced sexual activity: Not on file  Other Topics Concern  . Not on file  Social History Narrative   Lives alone.  Supportive friends and family.  His HIV Dx is not a secret.     Past Surgical History:  Procedure Laterality Date  . CARDIAC CATHETERIZATION N/A 07/29/2015   Procedure: Left Heart Cath and Coronary  Angiography;  Surgeon: [name], MD;  Location: [facility];  Service: Cardiovascular;  Laterality: N/A;  . CARDIAC CATHETERIZATION N/A 07/29/2015   Procedure: Coronary Stent Intervention;  Surgeon: [name], MD;  Location: [facility];  Service: Cardiovascular;  Laterality: N/A;  . CORONARY ARTERY BYPASS GRAFT  05/2002  . CORONARY STENT INTERVENTION N/A 12/01/2016   Procedure: Coronary Stent Intervention;  Surgeon: [name], MD;  Location: [facility];  Service: Cardiovascular;  Laterality: N/A;  . CORONARY STENT INTERVENTION N/A 10/21/2017   Procedure: CORONARY STENT INTERVENTION;  Surgeon: Jettie Booze, MD;  Location: [facility];  Service: Cardiovascular;  Laterality: N/A;  . LEFT HEART CATH AND CORS/GRAFTS ANGIOGRAPHY N/A 11/24/2016   Procedure: Left Heart Cath and Cors/Grafts Angiography;  Surgeon: Nelva Bush, MD;  Location: [facility];  Service: Cardiovascular;  Laterality: N/A;  . LEFT HEART CATH AND CORS/GRAFTS ANGIOGRAPHY N/A 12/01/2016   Procedure: Left Heart Cath and Cors/Grafts Angiography;  Surgeon: [name], MD;  Location: [facility];  Service: Cardiovascular;  Laterality: N/A;  . LEFT HEART CATH AND CORS/GRAFTS ANGIOGRAPHY N/A 10/21/2017   Procedure: LEFT HEART CATH AND CORS/GRAFTS ANGIOGRAPHY;  Surgeon: Jettie Booze, MD;  Location: [facility];  Service: Cardiovascular;  Laterality: N/A;  . VASECTOMY      Family History  Problem Relation Age of Onset  . Hyperlipidemia Mother   . Diabetes Mother        paternal grandparents/1 brother  . Hyperlipidemia Father   . Hypertension Father        paternal grandmother/3 brothers/1 sister  . Arthritis Unknown        mother/father/paternal grandparents  . Breast cancer Maternal Aunt        paternal aunt  . Lung cancer Maternal Aunt   . Heart disease Unknown        parents/maternal grandparents/ 2 brothers  . Stroke Paternal Grandmother    . Mental retardation Sister   . Colon cancer Neg Hx   . Esophageal cancer Neg Hx   . Pancreatic cancer Neg Hx   . Stomach cancer Neg Hx   . Liver disease Neg Hx     No Known Allergies  Current Outpatient Medications on File Prior to Visit  Medication Sig Dispense Refill  . acetaminophen (TYLENOL) 325 MG tablet Take 2 tablets (650 mg total) by mouth every 4 (four) hours as needed for headache or mild pain.    Marland Kitchen aspirin 81 MG chewable tablet Chew 81 mg by mouth daily.    Marland Kitchen atorvastatin (LIPITOR) 20 MG tablet Take 1 tablet (  20 mg total) by mouth daily. 90 tablet 3  . b complex vitamins tablet Take 1 tablet by mouth daily. 30 tablet 11  . Cholecalciferol 2000 units CAPS Take 1 capsule (2,000 Units total) by mouth daily. 30 each 11  . clopidogrel (PLAVIX) 75 MG tablet Take 1 tablet (75 mg total) by mouth daily. 30 tablet 11  . dolutegravir (TIVICAY) 50 MG tablet Take 1 tablet (50 mg total) by mouth daily. 30 tablet 11  . doravirine (PIFELTRO) 100 MG TABS tablet Take 1 tablet (100 mg total) by mouth daily. 30 tablet 11  . fenofibrate 54 MG tablet Take 1 tablet (54 mg total) by mouth daily. 90 tablet 3  . FREESTYLE TEST STRIPS test strip USE TO CHECK BLOOD SUGAR THREE TIMES DAILY 300 each 3  . furosemide (LASIX) 20 MG tablet TAKE 2 TABLETS BY MOUTH DAILY AS NEEDED FOR FLUID RETENTIONOR SWELLING 90 tablet 0  . isosorbide mononitrate (IMDUR) 30 MG 24 hr tablet TAKE 1 TABLET(30 MG) BY MOUTH DAILY (Patient taking differently: 15mg  by mouth twice daily) 90 tablet 2  . Lancets (FREESTYLE) lancets Use as directed three times a day to check blood sugar.  DX E11.9 100 each 5  . lisinopril (PRINIVIL,ZESTRIL) 5 MG tablet Take 1 tablet (5 mg total) by mouth daily. 90 tablet 3  . metFORMIN (GLUCOPHAGE-XR) 500 MG 24 hr tablet Take 1 tablet (500 mg total) by mouth daily with supper. 30 tablet 3  . metoprolol tartrate (LOPRESSOR) 25 MG tablet TAKE 1/2 TABLET BY MOUTH TWICE DAILY 60 tablet 11  .  neomycin-polymyxin-hydrocortisone (CORTISPORIN) otic solution INSTILL 3 DROPS IN BOTH EARS THREE TIMES DAILY (Patient taking differently: 3 drops into both ears daily as needed) 10 mL 0  . nitroGLYCERIN (NITROSTAT) 0.4 MG SL tablet Place 1 tablet (0.4 mg total) under the tongue every 5 (five) minutes as needed for chest pain. 25 tablet 3  . pantoprazole (PROTONIX) 40 MG tablet Take 1 tablet (40 mg total) by mouth daily. 30 tablet 5  . pioglitazone (ACTOS) 15 MG tablet TAKE 1 TABLET(15 MG) BY MOUTH DAILY 30 tablet 0  . potassium chloride SA (K-DUR,KLOR-CON) 20 MEQ tablet Take 1 tablet (20 mEq total) by mouth 3 (three) times daily. 270 tablet 3  . Probiotic Product (PROBIOTIC DAILY) CAPS Take 1 by mouth daily 30 capsule 11  . repaglinide (PRANDIN) 2 MG tablet TAKE 2 TABLETS BY MOUTH BEFORE BREAKFAST, 1 TABLET BY MOUTH BEFORE LUNCH AND 2 TABLETS BY MOUTH BEFORE SUPPER 450 tablet 3  . sodium bicarbonate 650 MG tablet Take 650 mg by mouth 3 (three) times daily.    . tamsulosin (FLOMAX) 0.4 MG CAPS capsule TK 1 C PO BID  11  . triamcinolone cream (KENALOG) 0.1 % apply twice daily as needed to dry, itchy skin  2  . valACYclovir (VALTREX) 500 MG tablet TAKE 1 TABLET BY MOUTH DAILY 30 tablet 3   No current facility-administered medications on file prior to visit.     BP 132/82   Pulse 73   Temp 98 F (36.7 C)   Resp 16   Ht 5\' 3"  (1.6 m)   Wt 147 lb 1 oz (66.7 kg)   SpO2 97%   BMI 26.05 kg/m       Objective:   Physical Exam  Constitutional: He is oriented to person, place, and time. He appears well-developed and well-nourished. No distress.  Musculoskeletal: Normal range of motion. He exhibits no edema or deformity.  He has full  rom and no decreased grip strength. Has obvious pain with certain movements   Neurological: He is alert and oriented to person, place, and time.  Skin: Capillary refill takes less than 2 seconds. He is not diaphoretic.  Psychiatric: He has a normal mood and affect.  His behavior is normal. Judgment and thought content normal.  Nursing note and vitals reviewed.     Assessment & Plan:  1. Acute pain of right shoulder - AMB referral to orthopedics - Doubt rotator [name], seems more as impingement from bone spur?  Dorothyann Peng, NP

## 2018-06-10 NOTE — Progress Notes (Signed)
Subjective: [name] is a 72 yo AAM who presents today with diabetes and cc of painful, discolored, thick toenails 1-5 b/l.  Pain is described as tender. Condition is longstanding. Pain is aggravated when wearing enclosed shoe gear. Pain is getting progressively worse. He has seen a community Podiatrist in the past for the same condition.   Medical History    12/02/2016 Chronic renal insufficiency, stage III (moderate) (HCC)  11/29/2016 NSTEMI (non-ST elevated myocardial infarction) ([place])  11/24/2016 Unstable angina ([place])  11/22/2016 Chest pain  12/04/2015 Nasal abscess  09/17/2015 Aortic stenosis  07/29/2015 STEMI (ST elevation myocardial infarction) ([place])  07/21/2015 Lesion of breast  07/10/2015 Carotid bruit  01/24/2015 Metatarsal deformity  01/24/2015 Keratoma  01/19/2015 Pain in joint, ankle and foot  05/28/2014 Constipation  05/28/2014 Absolute anemia  05/21/2014 Pedal edema  03/30/2014 Posterior cervical lymphadenopathy  03/23/2014 Hyperkalemia  03/23/2014 Hyponatremia  03/23/2014 Protein-calorie malnutrition, severe ([place])  11/20/2013 Diarrhea  11/19/2013 HLD (hyperlipidemia)  11/18/2013 Ascites  11/2013 Pancreatitis   06/05/2013 Epicondylitis   03/06/2013 Nocturia  11/27/2012 Dermatitis  07/06/2012 Night sweats  01/01/2012 Erectile dysfunction  12/20/2010 Lipodystrophy  12/20/2010 Dry eye syndrome  07/19/2010 BELLS PALSY   05/03/2009 GENITAL HERPES   05/03/2009 SHINGLES, HX OF   04/03/2009 INGUINAL LYMPHADENOPATHY, RIGHT   04/03/2009 WEIGHT LOSS, ABNORMAL   09/18/2008 Memory loss   07/20/2008 PERIPHERAL VASCULAR DISEASE   07/20/2008 COPD   07/17/2008 HIP PAIN, BILATERAL   07/17/2008 KNEE PAIN, BILATERAL   04/18/2008 HEMATOCHEZIA   09/03/2006 GERD   09/03/2006 DM (diabetes mellitus), type 2 with ophthalmic complications ([place])   0/93/2355 DEPRESSION   09/03/2006 Hx of CABG   05/12/2006 Essential hypertension   05/12/2006 CAD- S/P PCI SVG-OM1 12/01/16   05/12/2006 HEARING LOSS, SENSORINEURAL    05/12/2006 Human immunodeficiency virus (HIV) disease ([place])   1991 HIV (human immunodeficiency virus infection) ([place])   Date Unknown Arthritis  Date Unknown CAD (coronary artery disease)   Date Unknown Chronic kidney disease  Date Unknown Diabetes mellitus  Date Unknown GERD (gastroesophageal reflux disease)  Date Unknown History of depression  Date Unknown History of kidney stones  Date Unknown Hyperlipidemia  Date Unknown Hypertension   Surgical History   10/21/2017 Left heart cath and cors/grafts angiography (N/A)   10/21/2017 Coronary stent intervention (N/A)   12/01/2016 Left heart cath and cors/grafts angiography (N/A)   12/01/2016 Coronary stent intervention (N/A)   11/24/2016 Left heart cath and cors/grafts angiography (N/A)   07/29/2015 Cardiac catheterization (N/A)   07/29/2015 Cardiac catheterization (N/A)   05/2002 Coronary artery bypass graft  Date Unknown Vasectomy   Medications    acetaminophen (TYLENOL) 325 MG tablet    aspirin 81 MG chewable tablet    atorvastatin (LIPITOR) 20 MG tablet    b complex vitamins tablet    Cholecalciferol 2000 units CAPS    clopidogrel (PLAVIX) 75 MG tablet    dolutegravir (TIVICAY) 50 MG tablet    doravirine (PIFELTRO) 100 MG TABS tablet    fenofibrate 54 MG tablet    FREESTYLE TEST STRIPS test strip    furosemide (LASIX) 20 MG tablet    isosorbide mononitrate (IMDUR) 30 MG 24 hr tablet    Lancets (FREESTYLE) lancets    lisinopril (PRINIVIL,ZESTRIL) 5 MG tablet    metFORMIN (GLUCOPHAGE-XR) 500 MG 24 hr tablet    metFORMIN (GLUCOPHAGE-XR) 500 MG 24 hr tablet    metoprolol tartrate (LOPRESSOR) 25 MG tablet    neomycin-polymyxin-hydrocortisone (CORTISPORIN) otic solution    nitroGLYCERIN (NITROSTAT) 0.4 MG SL  tablet    pantoprazole (PROTONIX) 40 MG tablet    pioglitazone (ACTOS) 15 MG tablet    potassium chloride SA (K-DUR,KLOR-CON) 20 MEQ tablet    Probiotic Product (PROBIOTIC DAILY) CAPS    repaglinide (PRANDIN) 2 MG tablet     sodium bicarbonate 650 MG tablet    tamsulosin (FLOMAX) 0.4 MG CAPS capsule    triamcinolone cream (KENALOG) 0.1 %    valACYclovir (VALTREX) 500 MG tablet    VASCEPA 1 g CAPS    Allergies      No Known Allergies   Tobacco History   Smoking Status  Never Smoker  Smokeless Tobacco Status  Never Used   Family History   Mother (Deceased) Hyperlipidemia    Diabetes          Father (Deceased) Hyperlipidemia    Hypertension          Unknown Arthritis          Maternal Aunt Breast cancer          Maternal Aunt Lung cancer         Unknown Heart disease          Paternal Grandmother Stroke         Sister Mental retardation            Neg Hx Colon cancer    Esophageal cancer    Pancreatic cancer    Stomach cancer    Liver disease    ROS: Neuro: +tingling in feet MS: Cramping in feet  Objective: Vitals:   05/27/18 0906  BP: (!) 168/84  Pulse: 63   Vascular Examination: Capillary refill time <3 seconds x 10 digits Dorsalis pedis pulses nonpalpable b/l Posterior tibial pulse faintly palpable right; palpable left LE No digital hair x 10 digits Skin temperature warm to cool b/l  Dermatological Examination: Skin thin, shiny and atrophic b/l Toenails 1-5 b/l discolored, thick, dystrophic with subungual debris and pain with palpation to nailbeds due to thickness of nails. Hyperkeratotic lesion submet head 5 left foot  Musculoskeletal: Muscle strength 5/5 to all LE muscle groups  Neurological: Sensation intact with 10 gram monofilament. Vibratory sensation intact.  Assessment: 1. Painful onychomycosis toenails 1-5 b/l 2. NIDDM with Peripheral arterial disease  Plan: 1. Discussed examination and treatment options on today. Written information dispensed to pt on diabetic foot care.  2. Toenails 1-5 b/l were debrided in length and girth without iatrogenic bleeding. 3. Patient to continue soft, supportive shoe gear 4. Patient to report any pedal injuries  to medical professional  5. Follow up 3 months. Patient/POA to call should there be a concern in the interim.

## 2018-06-18 DIAGNOSIS — M25512 Pain in left shoulder: Secondary | ICD-10-CM | POA: Diagnosis not present

## 2018-06-20 ENCOUNTER — Other Ambulatory Visit: Payer: Self-pay | Admitting: Internal Medicine

## 2018-06-20 DIAGNOSIS — B029 Zoster without complications: Secondary | ICD-10-CM

## 2018-06-21 ENCOUNTER — Encounter: Payer: Self-pay | Admitting: Adult Health

## 2018-06-21 ENCOUNTER — Other Ambulatory Visit: Payer: Self-pay | Admitting: Endocrinology

## 2018-06-21 ENCOUNTER — Other Ambulatory Visit: Payer: Self-pay | Admitting: Cardiology

## 2018-06-22 ENCOUNTER — Other Ambulatory Visit: Payer: Self-pay

## 2018-06-22 MED ORDER — PIOGLITAZONE HCL 15 MG PO TABS: ORAL_TABLET | ORAL | 3 refills | Status: DC

## 2018-07-06 ENCOUNTER — Encounter: Payer: Self-pay | Admitting: Adult Health

## 2018-07-07 DIAGNOSIS — M7542 Impingement syndrome of left shoulder: Secondary | ICD-10-CM | POA: Diagnosis not present

## 2018-07-07 DIAGNOSIS — M25512 Pain in left shoulder: Secondary | ICD-10-CM | POA: Diagnosis not present

## 2018-07-07 DIAGNOSIS — M25612 Stiffness of left shoulder, not elsewhere classified: Secondary | ICD-10-CM | POA: Diagnosis not present

## 2018-07-08 DIAGNOSIS — M25512 Pain in left shoulder: Secondary | ICD-10-CM | POA: Insufficient documentation

## 2018-07-08 DIAGNOSIS — M25612 Stiffness of left shoulder, not elsewhere classified: Secondary | ICD-10-CM | POA: Insufficient documentation

## 2018-07-08 DIAGNOSIS — M7542 Impingement syndrome of left shoulder: Secondary | ICD-10-CM | POA: Insufficient documentation

## 2018-07-13 DIAGNOSIS — M25612 Stiffness of left shoulder, not elsewhere classified: Secondary | ICD-10-CM | POA: Diagnosis not present

## 2018-07-13 DIAGNOSIS — M7542 Impingement syndrome of left shoulder: Secondary | ICD-10-CM | POA: Diagnosis not present

## 2018-07-13 DIAGNOSIS — M25512 Pain in left shoulder: Secondary | ICD-10-CM | POA: Diagnosis not present

## 2018-07-14 ENCOUNTER — Ambulatory Visit: Payer: Medicare Other | Admitting: Orthotics

## 2018-07-14 DIAGNOSIS — L84 Corns and callosities: Secondary | ICD-10-CM

## 2018-07-14 DIAGNOSIS — M25572 Pain in left ankle and joints of left foot: Secondary | ICD-10-CM

## 2018-07-14 DIAGNOSIS — M21969 Unspecified acquired deformity of unspecified lower leg: Secondary | ICD-10-CM

## 2018-07-14 DIAGNOSIS — E1151 Type 2 diabetes mellitus with diabetic peripheral angiopathy without gangrene: Secondary | ICD-10-CM

## 2018-07-15 DIAGNOSIS — M7542 Impingement syndrome of left shoulder: Secondary | ICD-10-CM | POA: Diagnosis not present

## 2018-07-15 DIAGNOSIS — M25612 Stiffness of left shoulder, not elsewhere classified: Secondary | ICD-10-CM | POA: Diagnosis not present

## 2018-07-15 DIAGNOSIS — M25512 Pain in left shoulder: Secondary | ICD-10-CM | POA: Diagnosis not present

## 2018-07-20 DIAGNOSIS — M25512 Pain in left shoulder: Secondary | ICD-10-CM | POA: Diagnosis not present

## 2018-07-22 ENCOUNTER — Other Ambulatory Visit: Payer: Self-pay | Admitting: Cardiology

## 2018-07-22 ENCOUNTER — Other Ambulatory Visit: Payer: Self-pay | Admitting: Endocrinology

## 2018-07-22 DIAGNOSIS — M7542 Impingement syndrome of left shoulder: Secondary | ICD-10-CM | POA: Diagnosis not present

## 2018-07-22 DIAGNOSIS — M25512 Pain in left shoulder: Secondary | ICD-10-CM | POA: Diagnosis not present

## 2018-07-22 DIAGNOSIS — M25612 Stiffness of left shoulder, not elsewhere classified: Secondary | ICD-10-CM | POA: Diagnosis not present

## 2018-07-22 NOTE — Telephone Encounter (Signed)
Rx request sent to pharmacy.  

## 2018-07-23 ENCOUNTER — Other Ambulatory Visit: Payer: Medicare Other

## 2018-07-23 DIAGNOSIS — E1165 Type 2 diabetes mellitus with hyperglycemia: Secondary | ICD-10-CM

## 2018-07-23 DIAGNOSIS — B20 Human immunodeficiency virus [HIV] disease: Secondary | ICD-10-CM | POA: Diagnosis not present

## 2018-07-23 LAB — T-HELPER CELL (CD4) - (RCID CLINIC ONLY)
CD4 % Helper T Cell: 48 % (ref 33–55)
CD4 T CELL ABS: 300 /uL — AB (ref 400–2700)

## 2018-07-27 DIAGNOSIS — M25612 Stiffness of left shoulder, not elsewhere classified: Secondary | ICD-10-CM | POA: Diagnosis not present

## 2018-07-27 DIAGNOSIS — M25512 Pain in left shoulder: Secondary | ICD-10-CM | POA: Diagnosis not present

## 2018-07-27 DIAGNOSIS — M7542 Impingement syndrome of left shoulder: Secondary | ICD-10-CM | POA: Diagnosis not present

## 2018-07-27 LAB — HIV-1 RNA QUANT-NO REFLEX-BLD
HIV 1 RNA Quant: <20 copies/mL
HIV-1 RNA Quant, Log: <1.3 Log copies/mL

## 2018-07-29 DIAGNOSIS — M25512 Pain in left shoulder: Secondary | ICD-10-CM | POA: Diagnosis not present

## 2018-07-30 DIAGNOSIS — M25512 Pain in left shoulder: Secondary | ICD-10-CM | POA: Diagnosis not present

## 2018-07-30 DIAGNOSIS — M7542 Impingement syndrome of left shoulder: Secondary | ICD-10-CM | POA: Diagnosis not present

## 2018-08-03 DIAGNOSIS — M7542 Impingement syndrome of left shoulder: Secondary | ICD-10-CM | POA: Diagnosis not present

## 2018-08-03 DIAGNOSIS — M25512 Pain in left shoulder: Secondary | ICD-10-CM | POA: Diagnosis not present

## 2018-08-03 DIAGNOSIS — M25612 Stiffness of left shoulder, not elsewhere classified: Secondary | ICD-10-CM | POA: Diagnosis not present

## 2018-08-04 NOTE — Progress Notes (Signed)
Patient seen by Benjie Karvonen; however, we cannot fulfill/complete order as Patient is being seen by NP.    Dawn will notifiy

## 2018-08-05 DIAGNOSIS — M25512 Pain in left shoulder: Secondary | ICD-10-CM | POA: Diagnosis not present

## 2018-08-06 ENCOUNTER — Encounter: Payer: Self-pay | Admitting: Adult Health

## 2018-08-16 ENCOUNTER — Telehealth: Payer: Self-pay | Admitting: Podiatry

## 2018-08-16 DIAGNOSIS — N401 Enlarged prostate with lower urinary tract symptoms: Secondary | ICD-10-CM | POA: Diagnosis not present

## 2018-08-16 DIAGNOSIS — R3912 Poor urinary stream: Secondary | ICD-10-CM | POA: Diagnosis not present

## 2018-08-16 DIAGNOSIS — R351 Nocturia: Secondary | ICD-10-CM | POA: Diagnosis not present

## 2018-08-16 NOTE — Telephone Encounter (Signed)
Left message for pt that Dr Dwyane Dee has denied the diabetic shoes, He is not agreeing with our doctor and they will not get covered by insurance. Only other thing would be cash pay and to call if any questions.

## 2018-08-18 ENCOUNTER — Encounter: Payer: Self-pay | Admitting: Family Medicine

## 2018-08-19 ENCOUNTER — Other Ambulatory Visit: Payer: Self-pay

## 2018-08-19 ENCOUNTER — Ambulatory Visit (INDEPENDENT_AMBULATORY_CARE_PROVIDER_SITE_OTHER): Payer: Medicare Other | Admitting: Internal Medicine

## 2018-08-19 ENCOUNTER — Other Ambulatory Visit: Payer: Self-pay | Admitting: Internal Medicine

## 2018-08-19 ENCOUNTER — Ambulatory Visit: Payer: Medicare Other

## 2018-08-19 DIAGNOSIS — B20 Human immunodeficiency virus [HIV] disease: Secondary | ICD-10-CM
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
## 2018-08-19 NOTE — Assessment & Plan Note (Signed)
His infection remains under excellent, long-term control. He will continue his current antiretroviral regimen and follow-up after lab work in 6 months. 

## 2018-08-21 ENCOUNTER — Other Ambulatory Visit: Payer: Self-pay | Admitting: Endocrinology

## 2018-08-23 ENCOUNTER — Other Ambulatory Visit: Payer: Self-pay

## 2018-08-23 MED ORDER — ICOSAPENT ETHYL 1 G PO CAPS: ORAL_CAPSULE | ORAL | 0 refills | Status: DC

## 2018-08-26 ENCOUNTER — Encounter: Payer: Self-pay | Admitting: Podiatry

## 2018-08-26 ENCOUNTER — Ambulatory Visit (INDEPENDENT_AMBULATORY_CARE_PROVIDER_SITE_OTHER): Payer: Medicare Other | Admitting: Podiatry

## 2018-08-26 VITALS — BP 120/62 | HR 61 | Resp 16

## 2018-08-26 DIAGNOSIS — M79675 Pain in left toe(s): Secondary | ICD-10-CM

## 2018-08-26 DIAGNOSIS — M79674 Pain in right toe(s): Secondary | ICD-10-CM

## 2018-08-26 DIAGNOSIS — B351 Tinea unguium: Secondary | ICD-10-CM

## 2018-08-26 NOTE — Progress Notes (Signed)
Subjective: [name] presents today with painful, thick toenails 1-5 b/l that he cannot cut and which interfere with daily activities.  Pain is aggravated when wearing enclosed shoe gear.  Nafziger, Tommi Rumps, NP    Current Outpatient Medications:  .  acetaminophen (TYLENOL) 325 MG tablet, Take 2 tablets (650 mg total) by mouth every 4 (four) hours as needed for headache or mild pain., Disp: , Rfl:  .  aspirin 81 MG chewable tablet, Chew 81 mg by mouth daily., Disp: , Rfl:  .  atorvastatin (LIPITOR) 20 MG tablet, TAKE 1 TABLET(20 MG) BY MOUTH DAILY, Disp: 90 tablet, Rfl: 1 .  b complex vitamins tablet, Take 1 tablet by mouth daily., Disp: 30 tablet, Rfl: 11 .  Cholecalciferol 2000 units CAPS, Take 1 capsule (2,000 Units total) by mouth daily., Disp: 30 each, Rfl: 11 .  clopidogrel (PLAVIX) 75 MG tablet, Take 1 tablet (75 mg total) by mouth daily., Disp: 30 tablet, Rfl: 11 .  dolutegravir (TIVICAY) 50 MG tablet, Take 1 tablet (50 mg total) by mouth daily., Disp: 30 tablet, Rfl: 11 .  Dolutegravir-Rilpivirine (JULUCA) 50-25 MG TABS, [place] 50 mg-25 mg tablet  TK 1 T PO D WITH SUPPER, Disp: , Rfl:  .  doravirine (PIFELTRO) 100 MG TABS tablet, Take 1 tablet (100 mg total) by mouth daily., Disp: 30 tablet, Rfl: 11 .  fenofibrate 54 MG tablet, Take 1 tablet (54 mg total) by mouth daily., Disp: 90 tablet, Rfl: 3 .  fluticasone (FLONASE) 50 MCG/ACT nasal spray, fluticasone propionate 50 mcg/actuation nasal spray,suspension, Disp: , Rfl:  .  FREESTYLE TEST STRIPS test strip, USE TO CHECK BLOOD SUGAR THREE TIMES DAILY, Disp: 300 each, Rfl: 3 .  furosemide (LASIX) 20 MG tablet, TAKE 2 TABLETS BY MOUTH DAILY AS NEEDED FOR FLUID RETENTIONOR SWELLING, Disp: 90 tablet, Rfl: 0 .  Icosapent Ethyl (VASCEPA) 1 g CAPS, TAKE 2 CAPSULES(2 GRAMS) BY MOUTH TWICE DAILY, Disp: 120 capsule, Rfl: 0 .  Insulin Glargine, 1 Unit Dial, (TOUJEO SOLOSTAR) 300 UNIT/ML SOPN, Toujeo SoloStar U-300 Insulin 300 unit/mL (1.5 mL)  subcutaneous pen, Disp: , Rfl:  .  isosorbide mononitrate (IMDUR) 30 MG 24 hr tablet, TAKE 1 TABLET BY MOUTH DAILY, Disp: 90 tablet, Rfl: 2 .  Lancets (FREESTYLE) lancets, Use as directed three times a day to check blood sugar.  DX E11.9, Disp: 100 each, Rfl: 5 .  lisinopril (PRINIVIL,ZESTRIL) 5 MG tablet, Take 1 tablet (5 mg total) by mouth daily., Disp: 90 tablet, Rfl: 3 .  metFORMIN (GLUCOPHAGE-XR) 500 MG 24 hr tablet, Take 1 tablet (500 mg total) by mouth daily with supper., Disp: 30 tablet, Rfl: 3 .  metFORMIN (GLUCOPHAGE-XR) 500 MG 24 hr tablet, TAKE 1 TABLET(500 MG) BY MOUTH DAILY WITH SUPPER, Disp: 30 tablet, Rfl: 0 .  metolazone (ZAROXOLYN) 2.5 MG tablet, metolazone 2.5 mg tablet, Disp: , Rfl:  .  metoprolol tartrate (LOPRESSOR) 25 MG tablet, TAKE 1/2 TABLET BY MOUTH TWICE DAILY, Disp: 60 tablet, Rfl: 11 .  neomycin-polymyxin-hydrocortisone (CORTISPORIN) otic solution, INSTILL 3 DROPS IN BOTH EARS THREE TIMES DAILY (Patient taking differently: 3 drops into both ears daily as needed), Disp: 10 mL, Rfl: 0 .  nitroGLYCERIN (NITROSTAT) 0.4 MG SL tablet, Place 1 tablet (0.4 mg total) under the tongue every 5 (five) minutes as needed for chest pain., Disp: 25 tablet, Rfl: 3 .  pantoprazole (PROTONIX) 40 MG tablet, TAKE 1 TABLET(40 MG) BY MOUTH DAILY, Disp: 30 tablet, Rfl: 3 .  pioglitazone (ACTOS) 15 MG tablet,  TAKE 1 TABLET(15 MG) BY MOUTH DAILY, Disp: 30 tablet, Rfl: 3 .  potassium chloride SA (K-DUR,KLOR-CON) 20 MEQ tablet, Take 1 tablet (20 mEq total) by mouth 3 (three) times daily., Disp: 270 tablet, Rfl: 3 .  Probiotic Product (PROBIOTIC DAILY) CAPS, Take 1 by mouth daily, Disp: 30 capsule, Rfl: 11 .  repaglinide (PRANDIN) 2 MG tablet, TAKE 2 TABLETS BY MOUTH BEFORE BREAKFAST, 1 TABLET BY MOUTH BEFORE LUNCH AND 2 TABLETS BY MOUTH BEFORE SUPPER, Disp: 450 tablet, Rfl: 3 .  rilpivirine (EDURANT) 25 MG TABS tablet, [place] 25 mg tablet, Disp: , Rfl:  .  ritonavir (NORVIR) 100 MG TABS tablet,  ritonavir 100 mg tablet, Disp: , Rfl:  .  sodium bicarbonate 650 MG tablet, Take 650 mg by mouth 3 (three) times daily., Disp: , Rfl:  .  tamsulosin (FLOMAX) 0.4 MG CAPS capsule, TK 1 C PO BID, Disp: , Rfl: 11 .  ticagrelor (BRILINTA) 90 MG TABS tablet, Brilinta 90 mg tablet, Disp: , Rfl:  .  triamcinolone cream (KENALOG) 0.1 %, apply twice daily as needed to dry, itchy skin, Disp: , Rfl: 2 .  valACYclovir (VALTREX) 500 MG tablet, TAKE 1 TABLET BY MOUTH DAILY, Disp: 30 tablet, Rfl: 3  No Known Allergies  Objective:  Neurovascular status unchanged.  Dermatological Examination: Skin with normal turgor, texture and tone b/l  Toenails 1-5 b/l discolored, thick, dystrophic with subungual debris and pain with palpation to nailbeds due to thickness of nails.  Musculoskeletal: Muscle strength 5/5 to all LE muscle groups  No gross bony deformities b/l.  No pain, crepitus or joint limitation noted with ROM.    Assessment: Painful onychomycosis toenails 1-5 b/l   Plan: 1. Toenails 1-5 b/l were debrided in length and girth without iatrogenic bleeding. 2. Patient to continue soft, supportive shoe gear 3. Patient to report any pedal injuries to medical professional immediately. 4. Follow up 3 months. Patient/POA to call should there be a concern in the interim.

## 2018-08-26 NOTE — Patient Instructions (Signed)
Onychomycosis/Fungal Toenails  WHAT IS IT? An infection that lies within the keratin of your nail plate that is caused by a fungus.  WHY ME? Fungal infections affect all ages, sexes, races, and creeds.  There may be many factors that predispose you to a fungal infection such as age, coexisting medical conditions such as diabetes, or an autoimmune disease; stress, medications, fatigue, genetics, etc.  Bottom line: fungus thrives in a warm, moist environment and your shoes offer such a location.  IS IT CONTAGIOUS? Theoretically, yes.  You do not want to share shoes, nail clippers or files with someone who has fungal toenails.  Walking around barefoot in the same room or sleeping in the same bed is unlikely to transfer the organism.  It is important to realize, however, that fungus can spread easily from one nail to the next on the same foot.  HOW DO WE TREAT THIS?  There are several ways to treat this condition.  Treatment may depend on many factors such as age, medications, pregnancy, liver and kidney conditions, etc.  It is best to ask your doctor which options are available to you.  1. No treatment.   Unlike many other medical concerns, you can live with this condition.  However for many people this can be a painful condition and may lead to ingrown toenails or a bacterial infection.  It is recommended that you keep the nails cut short to help reduce the amount of fungal nail. 2. Topical treatment.  These range from herbal remedies to prescription strength nail lacquers.  About 40-50% effective, topicals require twice daily application for approximately 9 to 12 months or until an entirely new nail has grown out.  The most effective topicals are medical grade medications available through physicians offices. 3. Oral antifungal medications.  With an 80-90% cure rate, the most common oral medication requires 3 to 4 months of therapy and stays in your system for a year as the new nail grows out.  Oral  antifungal medications do require blood work to make sure it is a safe drug for you.  A liver function panel will be performed prior to starting the medication and after the first month of treatment.  It is important to have the blood work performed to avoid any harmful side effects.  In general, this medication safe but blood work is required. 4. Laser Therapy.  This treatment is performed by applying a specialized laser to the affected nail plate.  This therapy is noninvasive, fast, and non-painful.  It is not covered by insurance and is therefore, out of pocket.  The results have been very good with a 80-95% cure rate.  The [facility] is the only practice in the area to offer this therapy. Permanent Nail Avulsion.  Removing the entire nail so that a new nail will not grow back.  Diabetes Mellitus and Foot Care Foot care is an important part of your health, especially when you have diabetes. Diabetes may cause you to have problems because of poor blood flow (circulation) to your feet and legs, which can cause your skin to: Become thinner and drier. Break more easily. Heal more slowly. Peel and crack. You may also have nerve damage (neuropathy) in your legs and feet, causing decreased feeling in them. This means that you may not notice minor injuries to your feet that could lead to more serious problems. Noticing and addressing any potential problems early is the best way to prevent future foot problems. How to  care for your feet Foot hygiene Wash your feet daily with warm water and mild soap. Do not use hot water. Then, pat your feet and the areas between your toes until they are completely dry. Do not soak your feet as this can dry your skin. Trim your toenails straight across. Do not dig under them or around the cuticle. File the edges of your nails with an emery board or nail file. Apply a moisturizing lotion or petroleum jelly to the skin on your feet and to dry, brittle toenails. Use  lotion that does not contain alcohol and is unscented. Do not apply lotion between your toes. Shoes and socks Wear clean socks or stockings every day. Make sure they are not too tight. Do not wear knee-high stockings since they may decrease blood flow to your legs. Wear shoes that fit properly and have enough cushioning. Always look in your shoes before you put them on to be sure there are no objects inside. To break in new shoes, wear them for just a few hours a day. This prevents injuries on your feet. Wounds, scrapes, corns, and calluses Check your feet daily for blisters, cuts, bruises, sores, and redness. If you cannot see the bottom of your feet, use a mirror or ask someone for help. Do not cut corns or calluses or try to remove them with medicine. If you find a minor scrape, cut, or break in the skin on your feet, keep it and the skin around it clean and dry. You may clean these areas with mild soap and water. Do not clean the area with peroxide, alcohol, or iodine. If you have a wound, scrape, corn, or callus on your foot, look at it several times a day to make sure it is healing and not infected. Check for: Redness, swelling, or pain. Fluid or blood. Warmth. Pus or a bad smell. General instructions Do not cross your legs. This may decrease blood flow to your feet. Do not use heating pads or hot water bottles on your feet. They may burn your skin. If you have lost feeling in your feet or legs, you may not know this is happening until it is too late. Protect your feet from hot and cold by wearing shoes, such as at the beach or on hot pavement. Schedule a complete foot exam at least once a year (annually) or more often if you have foot problems. If you have foot problems, report any cuts, sores, or bruises to your health care provider immediately. Contact a health care provider if: You have a medical condition that increases your risk of infection and you have any cuts, sores, or bruises on  your feet. You have an injury that is not healing. You have redness on your legs or feet. You feel burning or tingling in your legs or feet. You have pain or cramps in your legs and feet. Your legs or feet are numb. Your feet always feel cold. You have pain around a toenail. Get help right away if: You have a wound, scrape, corn, or callus on your foot and: You have pain, swelling, or redness that gets worse. You have fluid or blood coming from the wound, scrape, corn, or callus. Your wound, scrape, corn, or callus feels warm to the touch. You have pus or a bad smell coming from the wound, scrape, corn, or callus. You have a fever. You have a red line going up your leg. Summary Check your feet every day for cuts, sores,  red spots, swelling, and blisters. Moisturize feet and legs daily. Wear shoes that fit properly and have enough cushioning. If you have foot problems, report any cuts, sores, or bruises to your health care provider immediately. Schedule a complete foot exam at least once a year (annually) or more often if you have foot problems. This information is not intended to replace advice given to you by your health care provider. Make sure you discuss any questions you have with your health care provider. Document Released: 07/18/2000 Document Revised: 09/02/2017 Document Reviewed: 08/22/2016 Elsevier Interactive Patient Education  [employer]. 5.

## 2018-08-30 ENCOUNTER — Other Ambulatory Visit: Payer: Self-pay | Admitting: Adult Health

## 2018-08-30 MED ORDER — FUROSEMIDE 20 MG PO TABS: ORAL_TABLET | ORAL | 1 refills | Status: DC

## 2018-08-30 NOTE — Progress Notes (Signed)
HPI: FU coronary artery disease. Patient is status post coronary artery bypass and graft in 2003. Carotid Dopplers December 2016 showed 1-39% bilateral stenosis. Patient had PCI of the sequential graft to the ramus and OM branches 4/18.  Last echocardiogram March 2019 showed normal LV function, grade 1 diastolic dysfunction and mild mitral regurgitation.  Patient had PCI of the saphenous vein graft to the diagonal and proximal graft of the saphenous vein graft to the obtuse marginal 3/19. The previously ballooned second part of SVG to obtuse marginal jump graft is now occluded, there were left to left collaterals filling the distal left circumflex artery. Since last seen,the patient has dyspnea with more extreme activities but not with routine activities. It is relieved with rest. It is not associated with chest pain. There is no orthopnea, PND or pedal edema. There is no syncope or palpitations. There is no exertional chest pain.   Current Outpatient Medications  Medication Sig Dispense Refill  . acetaminophen (TYLENOL) 325 MG tablet Take 2 tablets (650 mg total) by mouth every 4 (four) hours as needed for headache or mild pain.    Marland Kitchen aspirin 81 MG chewable tablet Chew 81 mg by mouth daily.    Marland Kitchen atorvastatin (LIPITOR) 20 MG tablet TAKE 1 TABLET(20 MG) BY MOUTH DAILY 90 tablet 1  . b complex vitamins tablet Take 1 tablet by mouth daily. 30 tablet 11  . Cholecalciferol 2000 units CAPS Take 1 capsule (2,000 Units total) by mouth daily. 30 each 11  . clopidogrel (PLAVIX) 75 MG tablet Take 1 tablet (75 mg total) by mouth daily. 30 tablet 11  . dolutegravir (TIVICAY) 50 MG tablet Take 1 tablet (50 mg total) by mouth daily. 30 tablet 11  . doravirine (PIFELTRO) 100 MG TABS tablet Take 1 tablet (100 mg total) by mouth daily. 30 tablet 11  . fenofibrate 54 MG tablet Take 1 tablet (54 mg total) by mouth daily. 90 tablet 3  . FREESTYLE TEST STRIPS test strip USE TO CHECK BLOOD SUGAR THREE TIMES DAILY  300 each 3  . furosemide (LASIX) 20 MG tablet TAKE 2 TABLETS BY MOUTH DAILY AS NEEDED FOR FLUID RETENTIONOR SWELLING 90 tablet 1  . Icosapent Ethyl (VASCEPA) 1 g CAPS TAKE 2 CAPSULES(2 GRAMS) BY MOUTH TWICE DAILY 120 capsule 0  . isosorbide mononitrate (IMDUR) 30 MG 24 hr tablet TAKE 1 TABLET BY MOUTH DAILY 90 tablet 2  . Lancets (FREESTYLE) lancets Use as directed three times a day to check blood sugar.  DX E11.9 100 each 5  . lisinopril (PRINIVIL,ZESTRIL) 5 MG tablet Take 1 tablet (5 mg total) by mouth daily. 90 tablet 3  . metFORMIN (GLUCOPHAGE-XR) 500 MG 24 hr tablet Take 1 tablet (500 mg total) by mouth daily with supper. 30 tablet 3  . metolazone (ZAROXOLYN) 2.5 MG tablet metolazone 2.5 mg tablet    . metoprolol tartrate (LOPRESSOR) 25 MG tablet TAKE 1/2 TABLET BY MOUTH TWICE DAILY 60 tablet 11  . pantoprazole (PROTONIX) 40 MG tablet TAKE 1 TABLET(40 MG) BY MOUTH DAILY 30 tablet 3  . pioglitazone (ACTOS) 15 MG tablet TAKE 1 TABLET(15 MG) BY MOUTH DAILY 30 tablet 3  . potassium chloride SA (K-DUR,KLOR-CON) 20 MEQ tablet Take 1 tablet (20 mEq total) by mouth 3 (three) times daily. 270 tablet 3  . Probiotic Product (PROBIOTIC DAILY) CAPS Take 1 by mouth daily 30 capsule 11  . repaglinide (PRANDIN) 2 MG tablet TAKE 2 TABLETS BY MOUTH BEFORE BREAKFAST, 1 TABLET  BY MOUTH BEFORE LUNCH AND 2 TABLETS BY MOUTH BEFORE SUPPER 450 tablet 3  . sodium bicarbonate 650 MG tablet Take 650 mg by mouth 3 (three) times daily.    . tamsulosin (FLOMAX) 0.4 MG CAPS capsule TK 1 C PO BID  11  . triamcinolone cream (KENALOG) 0.1 % apply twice daily as needed to dry, itchy skin  2  . valACYclovir (VALTREX) 500 MG tablet TAKE 1 TABLET BY MOUTH DAILY 30 tablet 3  . nitroGLYCERIN (NITROSTAT) 0.4 MG SL tablet Place 1 tablet (0.4 mg total) under the tongue every 5 (five) minutes as needed for chest pain. (Patient not taking: Reported on 09/08/2018) 25 tablet 3   No current facility-administered medications for this visit.        Past Medical History:  Diagnosis Date  . Absolute anemia 05/28/2014  . Aortic stenosis 09/17/2015  . Arthritis   . Ascites 11/18/2013  . BELLS PALSY 07/19/2010   Qualifier: Diagnosis of  By: [name], [name]    . CAD (coronary artery disease)    a. s/p CABG in 2003 b. s/p PCI to SVG-PDA in 07/2015 c. 11/2016: cath showing severe native CAD with patent LIMA-LAD and SVG-D1 with 80% stenosis of SVG-OM1-OM2. Initially medical management was recommended --> presented with recurrent angina --> s/p Synergy DES to proximal body of SVG-OM1-OM2, POBA to distal graft.   Marland Kitchen CAD- S/P PCI SVG-OM1 12/01/16 05/12/2006   Qualifier: Diagnosis of  By: [name], [name]    . Carotid bruit 07/10/2015  . Chest pain 11/22/2016  . Chronic kidney disease   . Chronic renal insufficiency, stage III (moderate) ([place]) 12/02/2016  . Constipation 05/28/2014  . COPD 07/20/2008   Qualifier: Diagnosis of  By: [name], [name]   . DEPRESSION 09/03/2006   Qualifier: Diagnosis of  By: [name], [name]    . Dermatitis 11/27/2012  . Diabetes mellitus   . Diarrhea 11/20/2013  . DM (diabetes mellitus), type 2 with ophthalmic complications ([place]) 2/50/0370   Qualifier: Diagnosis of  By: [name], [name]    . Dry eye syndrome 12/20/2010  . Epicondylitis 06/05/2013   right  . Erectile dysfunction 01/01/2012  . Essential hypertension 05/12/2006   Qualifier: Diagnosis of  By: [name], [name]    . GENITAL HERPES 05/03/2009   Qualifier: Diagnosis of  By: [name], [name]    . GERD 09/03/2006   Qualifier: Diagnosis of  By: [name], [name]    . GERD (gastroesophageal reflux disease)   . HEARING LOSS, SENSORINEURAL 05/12/2006   Qualifier: Diagnosis of  By: [name], [name]    . HEMATOCHEZIA 04/18/2008   Annotation: 9/09 during bout of constipation Qualifier: Diagnosis of  By: [name], [name]    . HIP PAIN, BILATERAL 07/17/2008   Qualifier: Diagnosis of  By: [name], [name]   . History of depression   . History of kidney  stones   . HIV (human immunodeficiency virus infection) ([place]) 1991   on meds since initial dx.   Marland Kitchen HLD (hyperlipidemia) 11/19/2013  . Human immunodeficiency virus (HIV) disease ([place]) 05/12/2006   Qualifier: Diagnosis of  By: [name], [name]    . Hx of CABG 09/03/2006   Annotation: 2003 Qualifier: Diagnosis of  By: [name], [name]    . Hyperkalemia 03/23/2014  . Hyperlipidemia   . Hypertension   . Hyponatremia 03/23/2014  . INGUINAL LYMPHADENOPATHY, RIGHT 04/03/2009   Qualifier: Diagnosis of  By: [name], [name]    .  Keratoma 01/24/2015  . KNEE PAIN, BILATERAL 07/17/2008   Qualifier: Diagnosis of  By: [name], [name]   . Lesion of breast 07/21/2015  . Lipodystrophy 12/20/2010  . Memory loss 09/18/2008   Qualifier: Diagnosis of  By: [name], [name]   . Metatarsal deformity 01/24/2015  . Nasal abscess 12/04/2015  . Night sweats 07/06/2012  . Nocturia 03/06/2013  . NSTEMI (non-ST elevated myocardial infarction) ([place]) 11/29/2016  . Pain in joint, ankle and foot 01/19/2015  . Pancreatitis 11/2013   attributed to HIV meds.   . Pedal edema 05/21/2014  . PERIPHERAL VASCULAR DISEASE 07/20/2008   Qualifier: Diagnosis of  By: [name], [name]   . Posterior cervical lymphadenopathy 03/30/2014  . Protein-calorie malnutrition, severe ([place]) 03/23/2014  . SHINGLES, HX OF 05/03/2009   Annotation: R leg Qualifier: Diagnosis of  By: [name], [name]    . STEMI (ST elevation myocardial infarction) ([place]) 07/29/2015  . Unstable angina ([place]) 11/24/2016  . WEIGHT LOSS, ABNORMAL 04/03/2009   Qualifier: Diagnosis of  By: [name], [name]      Past Surgical History:  Procedure Laterality Date  . CARDIAC CATHETERIZATION N/A 07/29/2015   Procedure: Left Heart Cath and Coronary Angiography;  Surgeon: [name], MD;  Location: [facility];  Service: Cardiovascular;  Laterality: N/A;  . CARDIAC CATHETERIZATION N/A 07/29/2015   Procedure: Coronary Stent Intervention;  Surgeon: [name], MD;   Location: [facility];  Service: Cardiovascular;  Laterality: N/A;  . CORONARY ARTERY BYPASS GRAFT  05/2002  . CORONARY STENT INTERVENTION N/A 12/01/2016   Procedure: Coronary Stent Intervention;  Surgeon: [name], MD;  Location: [facility];  Service: Cardiovascular;  Laterality: N/A;  . CORONARY STENT INTERVENTION N/A 10/21/2017   Procedure: CORONARY STENT INTERVENTION;  Surgeon: Jettie Booze, MD;  Location: [facility];  Service: Cardiovascular;  Laterality: N/A;  . LEFT HEART CATH AND CORS/GRAFTS ANGIOGRAPHY N/A 11/24/2016   Procedure: Left Heart Cath and Cors/Grafts Angiography;  Surgeon: Nelva Bush, MD;  Location: [facility];  Service: Cardiovascular;  Laterality: N/A;  . LEFT HEART CATH AND CORS/GRAFTS ANGIOGRAPHY N/A 12/01/2016   Procedure: Left Heart Cath and Cors/Grafts Angiography;  Surgeon: [name], MD;  Location: [facility];  Service: Cardiovascular;  Laterality: N/A;  . LEFT HEART CATH AND CORS/GRAFTS ANGIOGRAPHY N/A 10/21/2017   Procedure: LEFT HEART CATH AND CORS/GRAFTS ANGIOGRAPHY;  Surgeon: Jettie Booze, MD;  Location: [facility];  Service: Cardiovascular;  Laterality: N/A;  . VASECTOMY      Social History   Socioeconomic History  . Marital status: Divorced    Spouse name: Not on file  . Number of children: 4  . Years of education: Not on file  . Highest education level: Not on file  Occupational History  . 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   Social Needs  . Financial resource strain: Not on file  . Food insecurity:    Worry: Not on file    Inability: Not on file  . Transportation needs:    Medical: Not on file    Non-medical: Not on file  Tobacco Use  . Smoking status: Never Smoker  . Smokeless tobacco: Never Used  Substance and Sexual Activity  . Alcohol use: Yes    Alcohol/week: 0.0 standard drinks    Comment: rare   . Drug use: No  .  Sexual activity: Not Currently    Comment: declined condoms  Lifestyle  . Physical activity:    Days per week: Not on file    Minutes per session: Not on file  . Stress: Not on file  Relationships  . Social connections:    Talks on phone: Not on file    Gets together: Not on file    Attends religious service: Not on file    Active member of club or organization: Not on file    Attends meetings of clubs or organizations: Not on file    Relationship status: Not on file  . Intimate partner violence:    Fear of current or ex partner: Not on file    Emotionally abused: Not on file    Physically abused: Not on file    Forced sexual activity: Not on file  Other Topics Concern  . Not on file  Social History Narrative   Lives alone.  Supportive friends and family.  His HIV Dx is not a secret.     Family History  Problem Relation Age of Onset  . Hyperlipidemia Mother   . Diabetes Mother        paternal grandparents/1 brother  . Hyperlipidemia Father   . Hypertension Father        paternal grandmother/3 brothers/1 sister  . Arthritis Unknown        mother/father/paternal grandparents  . Breast cancer Maternal Aunt        paternal aunt  . Lung cancer Maternal Aunt   . Heart disease Unknown        parents/maternal grandparents/ 2 brothers  . Stroke Paternal Grandmother   . Mental retardation Sister   . Colon cancer Neg Hx   . Esophageal cancer Neg Hx   . Pancreatic cancer Neg Hx   . Stomach cancer Neg Hx   . Liver disease Neg Hx     ROS: no fevers or chills, productive cough, hemoptysis, dysphasia, odynophagia, melena, hematochezia, dysuria, hematuria, rash, seizure activity, orthopnea, PND, pedal edema, claudication. Remaining systems are negative.  Physical Exam: Well-developed well-nourished in no acute distress.  Skin is warm and dry.  HEENT is normal.  Neck is supple.  Chest is clear to auscultation with normal expansion.  Cardiovascular exam is  regular rate and rhythm. 2/6 systolic murmur Abdominal exam nontender or distended. No masses palpated. Extremities show no edema. neuro grossly intact  ECG-sinus bradycardia at a rate of 59, left ventricular hypertrophy, nonspecific ST changes, inferior infarct.  Unchanged compared to November 03, 2017.  Personally reviewed  A/P  1 coronary artery disease-patient denies chest pain.  Plan to continue aspirin, Plavix and statin.  I will discontinue Plavix at the end of March.  2 hypertension-patient's blood pressure is controlled.  Continue present medications and follow.  3 hyperlipidemia-continue statin.  4 history of mild aortic stenosis-not evident on most recent echocardiogram.  He will likely need repeat studies in the future.  5 chronic stage III kidney disease-followed by primary care.  Kirk Ruths, MD

## 2018-09-08 ENCOUNTER — Ambulatory Visit (INDEPENDENT_AMBULATORY_CARE_PROVIDER_SITE_OTHER): Payer: Medicare Other | Admitting: Cardiology

## 2018-09-08 ENCOUNTER — Encounter: Payer: Self-pay | Admitting: Cardiology

## 2018-09-08 VITALS — BP 120/66 | HR 59 | Ht 63.0 in | Wt 147.4 lb

## 2018-09-08 DIAGNOSIS — I10 Essential (primary) hypertension: Secondary | ICD-10-CM | POA: Diagnosis not present

## 2018-09-08 DIAGNOSIS — I2510 Atherosclerotic heart disease of native coronary artery without angina pectoris: Secondary | ICD-10-CM

## 2018-09-08 DIAGNOSIS — E7800 Pure hypercholesterolemia, unspecified: Secondary | ICD-10-CM | POA: Diagnosis not present

## 2018-09-08 NOTE — Patient Instructions (Signed)
Medication Instructions:  TAKE THE LAST DOSE OF PLAVIX MARCH [address] ST If you need a refill on your cardiac medications before your next appointment, please call your pharmacy.   Lab work: If you have labs (blood work) drawn today and your tests are completely normal, you will receive your results only by: Marland Kitchen MyChart Message (if you have MyChart) OR . A paper copy in the mail If you have any lab test that is abnormal or we need to change your treatment, we will call you to review the results.  Follow-Up: At [facility], you and your health needs are our priority.  As part of our continuing mission to provide you with exceptional heart care, we have created designated Provider Care Teams.  These Care Teams include your primary Cardiologist (physician) and Advanced Practice Providers (APPs -  Physician Assistants and Nurse Practitioners) who all work together to provide you with the care you need, when you need it. You will need a follow up appointment in 6 months.  Please call our office 2 months in advance to schedule this appointment.  You may see Kirk Ruths, MD or one of the following Advanced Practice Providers on your designated Care Team:   Kerin Ransom, PA-C Roby Lofts, [place] . Sande Rives, PA-C  CALL IN June TO SCHEDULE APPOINTMENT IN [place]

## 2018-09-10 NOTE — Addendum Note (Signed)
Addended by: Jacqulynn Cadet on: 09/10/2018 09:15 AM   Modules accepted: Orders

## 2018-09-10 NOTE — Telephone Encounter (Signed)
errotr 

## 2018-09-13 ENCOUNTER — Encounter: Payer: Self-pay | Admitting: Internal Medicine

## 2018-09-22 ENCOUNTER — Encounter: Payer: Self-pay | Admitting: Adult Health

## 2018-09-22 ENCOUNTER — Ambulatory Visit (INDEPENDENT_AMBULATORY_CARE_PROVIDER_SITE_OTHER): Payer: Medicare Other | Admitting: Adult Health

## 2018-09-22 VITALS — BP 138/68 | HR 69 | Temp 98.1°F | Wt 149.0 lb

## 2018-09-22 DIAGNOSIS — I2510 Atherosclerotic heart disease of native coronary artery without angina pectoris: Secondary | ICD-10-CM | POA: Diagnosis not present

## 2018-09-22 DIAGNOSIS — J329 Chronic sinusitis, unspecified: Secondary | ICD-10-CM | POA: Diagnosis not present

## 2018-09-22 DIAGNOSIS — B9789 Other viral agents as the cause of diseases classified elsewhere: Secondary | ICD-10-CM | POA: Diagnosis not present

## 2018-09-22 NOTE — Patient Instructions (Addendum)
It was great seeing you today  Your sinus infection appears to be viral   Pick up some Flonase and Mucinex to help with your symptoms   Rest and stay well hydrated    General Recommendations:    Please drink plenty of fluids.  Get plenty of rest   Sleep in humidified air  Use saline nasal sprays  Netti pot   OTC Medications:  Decongestants - helps relieve congestion   Flonase (generic fluticasone) or Nasacort (generic triamcinolone) - please make sure to use the "cross-over" technique at a 45 degree angle towards the opposite eye as opposed to straight up the nasal passageway.   Sudafed (generic pseudoephedrine - Note this is the one that is available behind the pharmacy counter); Products with phenylephrine (-PE) may also be used but is often not as effective as pseudoephedrine.   If you have HIGH BLOOD PRESSURE - Coricidin HBP; AVOID any product that is -D as this contains pseudoephedrine which may increase your blood pressure.  Afrin (oxymetazoline) every 6-8 hours for up to 3 days.   Allergies - helps relieve runny nose, itchy eyes and sneezing   Claritin (generic loratidine), Allegra (fexofenidine), or Zyrtec (generic cyrterizine) for runny nose. These medications should not cause drowsiness.  Note - Benadryl (generic diphenhydramine) may be used however may cause drowsiness  Cough -   Delsym or Robitussin (generic dextromethorphan)  Expectorants - helps loosen mucus to ease removal   Mucinex (generic guaifenesin) as directed on the package.  Headaches / General Aches   Tylenol (generic acetaminophen) - DO NOT EXCEED 3 grams (3,000 mg) in a 24 hour time period  Advil/Motrin (generic ibuprofen)   Sore Throat -   Salt water gargle   Chloraseptic (generic benzocaine) spray or lozenges / Sucrets (generic dyclonine)    Sinusitis Sinusitis is redness, soreness, and inflammation of the paranasal sinuses. Paranasal sinuses are air pockets within the  bones of your face (beneath the eyes, the middle of the forehead, or above the eyes). In healthy paranasal sinuses, mucus is able to drain out, and air is able to circulate through them by way of your nose. However, when your paranasal sinuses are inflamed, mucus and air can become trapped. This can allow bacteria and other germs to grow and cause infection. Sinusitis can develop quickly and last only a short time (acute) or continue over a long period (chronic). Sinusitis that lasts for more than 12 weeks is considered chronic.  CAUSES  Causes of sinusitis include:  Allergies.  Structural abnormalities, such as displacement of the cartilage that separates your nostrils (deviated septum), which can decrease the air flow through your nose and sinuses and affect sinus drainage.  Functional abnormalities, such as when the small hairs (cilia) that line your sinuses and help remove mucus do not work properly or are not present. SIGNS AND SYMPTOMS  Symptoms of acute and chronic sinusitis are the same. The primary symptoms are pain and pressure around the affected sinuses. Other symptoms include:  Upper toothache.  Earache.  Headache.  Bad breath.  Decreased sense of smell and taste.  A cough, which worsens when you are lying flat.  Fatigue.  Fever.  Thick drainage from your nose, which often is green and may contain pus (purulent).  Swelling and warmth over the affected sinuses. DIAGNOSIS  Your health care provider will perform a physical exam. During the exam, your health care provider may:  Look in your nose for signs of abnormal growths in your nostrils (  nasal polyps).  Tap over the affected sinus to check for signs of infection.  View the inside of your sinuses (endoscopy) using an imaging device that has a light attached (endoscope). If your health care provider suspects that you have chronic sinusitis, one or more of the following tests may be recommended:  Allergy  tests.  Nasal culture. A sample of mucus is taken from your nose, sent to a lab, and screened for bacteria.  Nasal cytology. A sample of mucus is taken from your nose and examined by your health care provider to determine if your sinusitis is related to an allergy. TREATMENT  Most cases of acute sinusitis are related to a viral infection and will resolve on their own within 10 days. Sometimes medicines are prescribed to help relieve symptoms (pain medicine, decongestants, nasal steroid sprays, or saline sprays).  However, for sinusitis related to a bacterial infection, your health care provider will prescribe antibiotic medicines. These are medicines that will help kill the bacteria causing the infection.  Rarely, sinusitis is caused by a fungal infection. In theses cases, your health care provider will prescribe antifungal medicine. For some cases of chronic sinusitis, surgery is needed. Generally, these are cases in which sinusitis recurs more than 3 times per year, despite other treatments. HOME CARE INSTRUCTIONS   Drink plenty of water. Water helps thin the mucus so your sinuses can drain more easily.  Use a humidifier.  Inhale steam 3 to 4 times a day (for example, sit in the bathroom with the shower running).  Apply a warm, moist washcloth to your face 3 to 4 times a day, or as directed by your health care provider.  Use saline nasal sprays to help moisten and clean your sinuses.  Take medicines only as directed by your health care provider.  If you were prescribed either an antibiotic or antifungal medicine, finish it all even if you start to feel better. SEEK IMMEDIATE MEDICAL CARE IF:  You have increasing pain or severe headaches.  You have nausea, vomiting, or drowsiness.  You have swelling around your face.  You have vision problems.  You have a stiff neck.  You have difficulty breathing. MAKE SURE YOU:   Understand these instructions.  Will watch your  condition.  Will get help right away if you are not doing well or get worse. Document Released: 07/21/2005 Document Revised: 12/05/2013 Document Reviewed: 08/05/2011 [facility] Patient Information 2015 [place], [place]. This information is not intended to replace advice given to you by your health care provider. Make sure you discuss any questions you have with your health care provider.

## 2018-09-22 NOTE — Progress Notes (Signed)
Subjective:    Patient ID: [name], male    DOB: 10-19-1945, 73 y.o.   MRN: [id number]  Sinusitis  This is a new problem. The current episode started in the past 7 days (3 days). The problem is unchanged. There has been no fever. Associated symptoms include chills, congestion, coughing (semi productive ), sinus pressure and sneezing. Pertinent negatives include no ear pain, headaches, shortness of breath or sore throat. Past treatments include nothing.      Review of Systems  Constitutional: Positive for chills and fatigue. Negative for activity change and fever.  HENT: Positive for congestion, postnasal drip, sinus pressure and sneezing. Negative for ear pain, sinus pain and sore throat.   Respiratory: Positive for cough (semi productive ). Negative for shortness of breath.   Cardiovascular: Negative.   Gastrointestinal: Negative.   Musculoskeletal: Negative.   Skin: Negative.   Neurological: Negative for headaches.   Past Medical History:  Diagnosis Date  . Absolute anemia 05/28/2014  . Aortic stenosis 09/17/2015  . Arthritis   . Ascites 11/18/2013  . BELLS PALSY 07/19/2010   Qualifier: Diagnosis of  By: [name], [name]    . CAD (coronary artery disease)    a. s/p CABG in 2003 b. s/p PCI to SVG-PDA in 07/2015 c. 11/2016: cath showing severe native CAD with patent LIMA-LAD and SVG-D1 with 80% stenosis of SVG-OM1-OM2. Initially medical management was recommended --> presented with recurrent angina --> s/p Synergy DES to proximal body of SVG-OM1-OM2, POBA to distal graft.   Marland Kitchen CAD- S/P PCI SVG-OM1 12/01/16 05/12/2006   Qualifier: Diagnosis of  By: [name], [name]    . Carotid bruit 07/10/2015  . Chest pain 11/22/2016  . Chronic kidney disease   . Chronic renal insufficiency, stage III (moderate) ([place]) 12/02/2016  . Constipation 05/28/2014  . COPD 07/20/2008   Qualifier: Diagnosis of  By: [name], [name]   . DEPRESSION 09/03/2006   Qualifier: Diagnosis of  By: [name],  [name]    . Dermatitis 11/27/2012  . Diabetes mellitus   . Diarrhea 11/20/2013  . DM (diabetes mellitus), type 2 with ophthalmic complications ([place]) 0/86/7619   Qualifier: Diagnosis of  By: [name], [name]    . Dry eye syndrome 12/20/2010  . Epicondylitis 06/05/2013   right  . Erectile dysfunction 01/01/2012  . Essential hypertension 05/12/2006   Qualifier: Diagnosis of  By: [name], [name]    . GENITAL HERPES 05/03/2009   Qualifier: Diagnosis of  By: [name], [name]    . GERD 09/03/2006   Qualifier: Diagnosis of  By: [name], [name]    . GERD (gastroesophageal reflux disease)   . HEARING LOSS, SENSORINEURAL 05/12/2006   Qualifier: Diagnosis of  By: [name], [name]    . HEMATOCHEZIA 04/18/2008   Annotation: 9/09 during bout of constipation Qualifier: Diagnosis of  By: [name], [name]    . HIP PAIN, BILATERAL 07/17/2008   Qualifier: Diagnosis of  By: [name], [name]   . History of depression   . History of kidney stones   . HIV (human immunodeficiency virus infection) ([place]) 1991   on meds since initial dx.   Marland Kitchen HLD (hyperlipidemia) 11/19/2013  . Human immunodeficiency virus (HIV) disease ([place]) 05/12/2006   Qualifier: Diagnosis of  By: [name], [name]    . Hx of CABG 09/03/2006   Annotation: 2003 Qualifier: Diagnosis of  By: [name], [name]    . Hyperkalemia 03/23/2014  . Hyperlipidemia   .  Hypertension   . Hyponatremia 03/23/2014  . INGUINAL LYMPHADENOPATHY, RIGHT 04/03/2009   Qualifier: Diagnosis of  By: [name], [name]    . Keratoma 01/24/2015  . KNEE PAIN, BILATERAL 07/17/2008   Qualifier: Diagnosis of  By: [name], [name]   . Lesion of breast 07/21/2015  . Lipodystrophy 12/20/2010  . Memory loss 09/18/2008   Qualifier: Diagnosis of  By: [name], [name]   . Metatarsal deformity 01/24/2015  . Nasal abscess 12/04/2015  . Night sweats 07/06/2012  . Nocturia 03/06/2013  . NSTEMI (non-ST elevated myocardial infarction) ([place]) 11/29/2016  . Pain in joint, ankle and foot 01/19/2015   . Pancreatitis 11/2013   attributed to HIV meds.   . Pedal edema 05/21/2014  . PERIPHERAL VASCULAR DISEASE 07/20/2008   Qualifier: Diagnosis of  By: [name], [name]   . Posterior cervical lymphadenopathy 03/30/2014  . Protein-calorie malnutrition, severe ([place]) 03/23/2014  . SHINGLES, HX OF 05/03/2009   Annotation: R leg Qualifier: Diagnosis of  By: [name], [name]    . STEMI (ST elevation myocardial infarction) ([place]) 07/29/2015  . Unstable angina ([place]) 11/24/2016  . WEIGHT LOSS, ABNORMAL 04/03/2009   Qualifier: Diagnosis of  By: [name], [name]      Social History   Socioeconomic History  . Marital status: Divorced    Spouse name: Not on file  . Number of children: 4  . Years of education: Not on file  . Highest education level: Not on file  Occupational History  . 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   Social Needs  . Financial resource strain: Not on file  . Food insecurity:    Worry: Not on file    Inability: Not on file  . Transportation needs:    Medical: Not on file    Non-medical: Not on file  Tobacco Use  . Smoking status: Never Smoker  . Smokeless tobacco: Never Used  Substance and Sexual Activity  . Alcohol use: Yes    Alcohol/week: 0.0 standard drinks    Comment: rare  . Drug use: No  . Sexual activity: Not Currently    Comment: declined condoms  Lifestyle  . Physical activity:    Days per week: Not on file    Minutes per session: Not on file  . Stress: Not on file  Relationships  . Social connections:    Talks on phone: Not on file    Gets together: Not on file    Attends religious service: Not on file    Active member of club or organization: Not on file    Attends meetings of clubs or organizations: Not on file    Relationship status: Not on file  . Intimate partner violence:    Fear of current or ex partner: Not on file    Emotionally abused: Not on file    Physically abused: Not on  file    Forced sexual activity: Not on file  Other Topics Concern  . Not on file  Social History Narrative   Lives alone.  Supportive friends and family.  His HIV Dx is not a secret.     Past Surgical History:  Procedure Laterality Date  . CARDIAC CATHETERIZATION N/A 07/29/2015   Procedure: Left Heart Cath and Coronary Angiography;  Surgeon: [name], MD;  Location: [facility];  Service: Cardiovascular;  Laterality: N/A;  . CARDIAC CATHETERIZATION N/A 07/29/2015   Procedure: Coronary  Stent Intervention;  Surgeon: [name], MD;  Location: [place];  Service: Cardiovascular;  Laterality: N/A;  . CORONARY ARTERY BYPASS GRAFT  05/2002  . CORONARY STENT INTERVENTION N/A 12/01/2016   Procedure: Coronary Stent Intervention;  Surgeon: [name], MD;  Location: [facility];  Service: Cardiovascular;  Laterality: N/A;  . CORONARY STENT INTERVENTION N/A 10/21/2017   Procedure: CORONARY STENT INTERVENTION;  Surgeon: Jettie Booze, MD;  Location: [facility];  Service: Cardiovascular;  Laterality: N/A;  . LEFT HEART CATH AND CORS/GRAFTS ANGIOGRAPHY N/A 11/24/2016   Procedure: Left Heart Cath and Cors/Grafts Angiography;  Surgeon: Nelva Bush, MD;  Location: [facility];  Service: Cardiovascular;  Laterality: N/A;  . LEFT HEART CATH AND CORS/GRAFTS ANGIOGRAPHY N/A 12/01/2016   Procedure: Left Heart Cath and Cors/Grafts Angiography;  Surgeon: [name], MD;  Location: [facility];  Service: Cardiovascular;  Laterality: N/A;  . LEFT HEART CATH AND CORS/GRAFTS ANGIOGRAPHY N/A 10/21/2017   Procedure: LEFT HEART CATH AND CORS/GRAFTS ANGIOGRAPHY;  Surgeon: Jettie Booze, MD;  Location: [facility];  Service: Cardiovascular;  Laterality: N/A;  . VASECTOMY      Family History  Problem Relation Age of Onset  . Hyperlipidemia Mother   . Diabetes Mother        paternal grandparents/1 brother  . Hyperlipidemia Father     . Hypertension Father        paternal grandmother/3 brothers/1 sister  . Arthritis Unknown        mother/father/paternal grandparents  . Breast cancer Maternal Aunt        paternal aunt  . Lung cancer Maternal Aunt   . Heart disease Unknown        parents/maternal grandparents/ 2 brothers  . Stroke Paternal Grandmother   . Mental retardation Sister   . Colon cancer Neg Hx   . Esophageal cancer Neg Hx   . Pancreatic cancer Neg Hx   . Stomach cancer Neg Hx   . Liver disease Neg Hx     No Known Allergies  Current Outpatient Medications on File Prior to Visit  Medication Sig Dispense Refill  . acetaminophen (TYLENOL) 325 MG tablet Take 2 tablets (650 mg total) by mouth every 4 (four) hours as needed for headache or mild pain.    Marland Kitchen aspirin 81 MG chewable tablet Chew 81 mg by mouth daily.    Marland Kitchen atorvastatin (LIPITOR) 20 MG tablet TAKE 1 TABLET(20 MG) BY MOUTH DAILY 90 tablet 1  . b complex vitamins tablet Take 1 tablet by mouth daily. 30 tablet 11  . Cholecalciferol 2000 units CAPS Take 1 capsule (2,000 Units total) by mouth daily. 30 each 11  . clopidogrel (PLAVIX) 75 MG tablet Take 1 tablet (75 mg total) by mouth daily. 30 tablet 11  . dolutegravir (TIVICAY) 50 MG tablet Take 1 tablet (50 mg total) by mouth daily. 30 tablet 11  . doravirine (PIFELTRO) 100 MG TABS tablet Take 1 tablet (100 mg total) by mouth daily. 30 tablet 11  . fenofibrate 54 MG tablet Take 1 tablet (54 mg total) by mouth daily. 90 tablet 3  . FREESTYLE TEST STRIPS test strip USE TO CHECK BLOOD SUGAR THREE TIMES DAILY 300 each 3  . furosemide (LASIX) 20 MG tablet TAKE 2 TABLETS BY MOUTH DAILY AS NEEDED FOR FLUID RETENTIONOR SWELLING 90 tablet 1  . Icosapent Ethyl (VASCEPA) 1 g CAPS TAKE 2 CAPSULES(2 GRAMS) BY MOUTH TWICE DAILY  120 capsule 0  . isosorbide mononitrate (IMDUR) 30 MG 24 hr tablet TAKE 1 TABLET BY MOUTH DAILY 90 tablet 2  . Lancets (FREESTYLE) lancets Use as directed three times a day to check blood  sugar.  DX E11.9 100 each 5  . lisinopril (PRINIVIL,ZESTRIL) 5 MG tablet Take 1 tablet (5 mg total) by mouth daily. 90 tablet 3  . metFORMIN (GLUCOPHAGE-XR) 500 MG 24 hr tablet Take 1 tablet (500 mg total) by mouth daily with supper. 30 tablet 3  . metolazone (ZAROXOLYN) 2.5 MG tablet metolazone 2.5 mg tablet    . metoprolol tartrate (LOPRESSOR) 25 MG tablet TAKE 1/2 TABLET BY MOUTH TWICE DAILY 60 tablet 11  . nitroGLYCERIN (NITROSTAT) 0.4 MG SL tablet Place 1 tablet (0.4 mg total) under the tongue every 5 (five) minutes as needed for chest pain. 25 tablet 3  . pantoprazole (PROTONIX) 40 MG tablet TAKE 1 TABLET(40 MG) BY MOUTH DAILY 30 tablet 3  . pioglitazone (ACTOS) 15 MG tablet TAKE 1 TABLET(15 MG) BY MOUTH DAILY 30 tablet 3  . potassium chloride SA (K-DUR,KLOR-CON) 20 MEQ tablet Take 1 tablet (20 mEq total) by mouth 3 (three) times daily. 270 tablet 3  . Probiotic Product (PROBIOTIC DAILY) CAPS Take 1 by mouth daily 30 capsule 11  . repaglinide (PRANDIN) 2 MG tablet TAKE 2 TABLETS BY MOUTH BEFORE BREAKFAST, 1 TABLET BY MOUTH BEFORE LUNCH AND 2 TABLETS BY MOUTH BEFORE SUPPER 450 tablet 3  . sodium bicarbonate 650 MG tablet Take 650 mg by mouth 3 (three) times daily.    . tamsulosin (FLOMAX) 0.4 MG CAPS capsule TK 1 C PO BID  11  . triamcinolone cream (KENALOG) 0.1 % apply twice daily as needed to dry, itchy skin  2  . valACYclovir (VALTREX) 500 MG tablet TAKE 1 TABLET BY MOUTH DAILY 30 tablet 3   No current facility-administered medications on file prior to visit.     BP 138/68   Pulse 69   Temp 98.1 F (36.7 C)   Wt 149 lb (67.6 kg)   SpO2 98%   BMI 26.39 kg/m       Objective:   Physical Exam Vitals signs reviewed.  HENT:     Head: Normocephalic.     Right Ear: Tympanic membrane, ear canal and external ear normal. There is no impacted cerumen.     Left Ear: Tympanic membrane, ear canal and external ear normal.     Nose: Congestion and rhinorrhea present. Rhinorrhea is clear.       Right Turbinates: Swollen.     Left Turbinates: Swollen.     Right Sinus: Maxillary sinus tenderness and frontal sinus tenderness present.     Left Sinus: Maxillary sinus tenderness and frontal sinus tenderness present.     Mouth/Throat:     Mouth: Mucous membranes are moist.     Pharynx: Oropharynx is clear.  Cardiovascular:     Rate and Rhythm: Normal rate and regular rhythm.     Pulses: Normal pulses.     Heart sounds: Normal heart sounds.  Pulmonary:     Effort: Pulmonary effort is normal.     Breath sounds: Normal breath sounds.  Neurological:     General: No focal deficit present.     Mental Status: He is alert and oriented to person, place, and time.       Assessment & Plan:  1. Viral sinusitis - Does not appear acutely ill  - Advised conservative measures including flonase, mucinex, rest and  hydration  - Follow up if no improvement in the next 4-5 days or sooner if needed  Dorothyann Peng, NP

## 2018-09-30 DIAGNOSIS — N183 Chronic kidney disease, stage 3 (moderate): Secondary | ICD-10-CM | POA: Diagnosis not present

## 2018-09-30 LAB — VITAMIN D 25 HYDROXY (VIT D DEFICIENCY, FRACTURES): Vit D, 25-Hydroxy: 34.1

## 2018-09-30 LAB — BASIC METABOLIC PANEL
BUN: 22 — AB (ref 4–21)
Creatinine: 1.6 — AB (ref 0.6–1.3)
Glucose: 195
Potassium: 4.6 (ref 3.4–5.3)
SODIUM: 142 (ref 137–147)

## 2018-09-30 LAB — CBC AND DIFFERENTIAL
HCT: 36 — AB (ref 41–53)
Hemoglobin: 12.2 — AB (ref 13.5–17.5)
Platelets: 201 (ref 150–399)
WBC: 4.5

## 2018-10-04 ENCOUNTER — Other Ambulatory Visit: Payer: Medicare Other

## 2018-10-08 ENCOUNTER — Ambulatory Visit: Payer: Medicare Other | Admitting: Endocrinology

## 2018-10-08 DIAGNOSIS — I129 Hypertensive chronic kidney disease with stage 1 through stage 4 chronic kidney disease, or unspecified chronic kidney disease: Secondary | ICD-10-CM | POA: Diagnosis not present

## 2018-10-08 DIAGNOSIS — N2581 Secondary hyperparathyroidism of renal origin: Secondary | ICD-10-CM | POA: Diagnosis not present

## 2018-10-08 DIAGNOSIS — N183 Chronic kidney disease, stage 3 (moderate): Secondary | ICD-10-CM | POA: Diagnosis not present

## 2018-10-08 DIAGNOSIS — D631 Anemia in chronic kidney disease: Secondary | ICD-10-CM | POA: Diagnosis not present

## 2018-10-10 ENCOUNTER — Other Ambulatory Visit (HOSPITAL_COMMUNITY): Payer: Self-pay | Admitting: Physician Assistant

## 2018-10-10 ENCOUNTER — Other Ambulatory Visit: Payer: Self-pay | Admitting: Internal Medicine

## 2018-10-11 ENCOUNTER — Other Ambulatory Visit: Payer: Self-pay | Admitting: *Deleted

## 2018-10-11 MED ORDER — CLOPIDOGREL BISULFATE 75 MG PO TABS: 75.0000 mg | ORAL_TABLET | Freq: Every day | ORAL | 11 refills | Status: DC

## 2018-10-12 ENCOUNTER — Other Ambulatory Visit: Payer: Self-pay | Admitting: Internal Medicine

## 2018-10-12 DIAGNOSIS — B029 Zoster without complications: Secondary | ICD-10-CM

## 2018-10-14 ENCOUNTER — Other Ambulatory Visit: Payer: Self-pay | Admitting: Cardiology

## 2018-10-15 ENCOUNTER — Encounter: Payer: Self-pay | Admitting: Family Medicine

## 2018-10-17 ENCOUNTER — Other Ambulatory Visit: Payer: Self-pay | Admitting: Endocrinology

## 2018-10-19 ENCOUNTER — Other Ambulatory Visit: Payer: Self-pay | Admitting: *Deleted

## 2018-10-19 MED ORDER — FENOFIBRATE 54 MG PO TABS: 54.0000 mg | ORAL_TABLET | Freq: Every day | ORAL | 3 refills | Status: DC

## 2018-10-20 ENCOUNTER — Other Ambulatory Visit (INDEPENDENT_AMBULATORY_CARE_PROVIDER_SITE_OTHER): Payer: Medicare Other

## 2018-10-20 ENCOUNTER — Other Ambulatory Visit: Payer: Self-pay

## 2018-10-20 DIAGNOSIS — E1165 Type 2 diabetes mellitus with hyperglycemia: Secondary | ICD-10-CM | POA: Diagnosis not present

## 2018-10-20 LAB — COMPREHENSIVE METABOLIC PANEL
ALT: 30 U/L (ref 0–53)
AST: 22 U/L (ref 0–37)
Albumin: 4 g/dL (ref 3.5–5.2)
Alkaline Phosphatase: 40 U/L (ref 39–117)
BILIRUBIN TOTAL: 0.4 mg/dL (ref 0.2–1.2)
BUN: 36 mg/dL — ABNORMAL HIGH (ref 6–23)
CO2: 25 mEq/L (ref 19–32)
Calcium: 9.7 mg/dL (ref 8.4–10.5)
Chloride: 106 mEq/L (ref 96–112)
Creatinine, Ser: 1.73 mg/dL — ABNORMAL HIGH (ref 0.40–1.50)
GFR: 39.2 mL/min — AB (ref >60.00)
Glucose, Bld: 144 mg/dL — ABNORMAL HIGH (ref 70–99)
Potassium: 3.9 mEq/L (ref 3.5–5.1)
Sodium: 138 mEq/L (ref 135–145)
Total Protein: 6.8 g/dL (ref 6.0–8.3)

## 2018-10-20 LAB — HEMOGLOBIN A1C: Hgb A1c MFr Bld: 7.3 % — ABNORMAL HIGH (ref 4.6–6.5)

## 2018-10-21 ENCOUNTER — Other Ambulatory Visit: Payer: Self-pay | Admitting: Endocrinology

## 2018-10-26 ENCOUNTER — Ambulatory Visit: Payer: Medicare Other | Admitting: Endocrinology

## 2018-10-27 ENCOUNTER — Ambulatory Visit (INDEPENDENT_AMBULATORY_CARE_PROVIDER_SITE_OTHER): Payer: Medicare Other | Admitting: Endocrinology

## 2018-10-27 ENCOUNTER — Encounter: Payer: Self-pay | Admitting: Endocrinology

## 2018-10-27 ENCOUNTER — Other Ambulatory Visit: Payer: Self-pay

## 2018-10-27 VITALS — BP 100/60 | HR 73 | Temp 97.9°F | Ht 63.0 in | Wt 146.4 lb

## 2018-10-27 DIAGNOSIS — I2510 Atherosclerotic heart disease of native coronary artery without angina pectoris: Secondary | ICD-10-CM

## 2018-10-27 DIAGNOSIS — E1165 Type 2 diabetes mellitus with hyperglycemia: Secondary | ICD-10-CM

## 2018-10-27 NOTE — Progress Notes (Signed)
Patient ID: [name], male   DOB: 02/27/1946, 73 y.o.   MRN: [id number]           Reason for Appointment: Follow-up for Type 2 Diabetes   History of Present Illness:          Date of diagnosis of type 2 diabetes mellitus: 2013        Background history:  He only mildly increased blood sugar levels at the time of diagnosis and A1c was 7.1  He had been treated initially with metformin but this was stopped subsequently when his renal function was worse  He was subsequently treated with glipizide but his blood sugars had been poorly controlled in 2016 with glipizide alone Since his A1c has been persistently over 9% he was started on Januvia 50 mg in 01/2015 At initial consultation he had a high A1c of 9.5; he was switched from glipizide to Prandin in 02/2015 Previously when he had high fasting readings he was started on Toujeo on 06/11/16, however this was stopped in April 2018 when he was getting hypoglycemia  Recent history:    Oral hypoglycemic drugs: Prandin 4 mg at breakfast  and 2 at dinner and 2 mg before lunch , Actos 15 mg daily, metformin ER 500 mg daily   His A1c is again over 7% and now 7.3 compared to 7.1, previous range 6.3-7.6  Current blood sugar patterns and problems identified:  He apparently had a low sugar episode last Sunday morning when he was feeling a little weak and funny without shakiness or sweating  However he treated this with a protein drink and does not know how to treat a low sugar  Not clear why his blood sugar was low that morning; however he is apparently taking 2 tablets of Prandin at suppertime instead of 1 tablet that he was doing previously  Has not had any other low sugar episode  Despite reminders he is still forgetting to check his blood sugars with only random blood sugars over the last couple of months  Highest reading was about 160 after lunch about 6 weeks ago  Currently his test strips strips are expired  Fasting glucose in the  lab was 144, drawn at around 10 AM when he thinks he was fasting He has been continued on all his medications as before   Side effects from medications have been: None   Glucose monitoring:  done usually 0-1  times a day         Glucometer:  FreeStyle     Blood Glucose readings  He has only 2 readings in the last month, 123 fasting previously  Dietician visit, most recent: 8/15 DIET:  has been trying to reduce fried/usually not eating high fat  foods; eating oatmeal in the morning+ greek yogurt               Dinner usually at 6 pm Exercise:  walking 1 mile at times .  Weight history:   Wt Readings from Last 3 Encounters:  10/27/18 146 lb 6.4 oz (66.4 kg)  09/22/18 149 lb (67.6 kg)  09/08/18 147 lb 6.4 oz (66.9 kg)    Glycemic control:   Lab Results  Component Value Date   HGBA1C 7.3 (H) 10/20/2018   HGBA1C 7.1 (H) 06/04/2018   HGBA1C 7.1 (H) 02/22/2018   Lab Results  Component Value Date   MICROALBUR 3.4 (H) 07/10/2017   LDLCALC 60 02/22/2018   CREATININE 1.73 (H) 10/20/2018    Other active  problems: See review of systems   No visits with results within 1 Week(s) from this visit.  Latest known visit with results is:  Lab on 10/20/2018  Component Date Value Ref Range Status  . Sodium 10/20/2018 138  135 - 145 mEq/L Final  . Potassium 10/20/2018 3.9  3.5 - 5.1 mEq/L Final  . Chloride 10/20/2018 106  96 - 112 mEq/L Final  . CO2 10/20/2018 25  19 - 32 mEq/L Final  . Glucose, Bld 10/20/2018 144* 70 - 99 mg/dL Final  . BUN 10/20/2018 36* 6 - 23 mg/dL Final  . Creatinine, Ser 10/20/2018 1.73* 0.40 - 1.50 mg/dL Final  . Total Bilirubin 10/20/2018 0.4  0.2 - 1.2 mg/dL Final  . Alkaline Phosphatase 10/20/2018 40  39 - 117 U/L Final  . AST 10/20/2018 22  0 - 37 U/L Final  . ALT 10/20/2018 30  0 - 53 U/L Final  . Total Protein 10/20/2018 6.8  6.0 - 8.3 g/dL Final  . Albumin 10/20/2018 4.0  3.5 - 5.2 g/dL Final  . Calcium 10/20/2018 9.7  8.4 - 10.5 mg/dL Final  . GFR  10/20/2018 39.20* >60.00 mL/min Final  . Hgb A1c MFr Bld 10/20/2018 7.3* 4.6 - 6.5 % Final   Glycemic Control Guidelines for People with Diabetes:Non Diabetic:  <6%Goal of Therapy: <7%Additional Action Suggested:  >8%        Allergies as of 10/27/2018   No Known Allergies     Medication List       Accurate as of October 27, 2018  3:03 PM. Always use your most recent med list.        acetaminophen 325 MG tablet Commonly known as:  TYLENOL Take 2 tablets (650 mg total) by mouth every 4 (four) hours as needed for headache or mild pain.   aspirin 81 MG chewable tablet Chew 81 mg by mouth daily.   atorvastatin 20 MG tablet Commonly known as:  LIPITOR TAKE 1 TABLET(20 MG) BY MOUTH DAILY   b complex vitamins tablet Take 1 tablet by mouth daily.   Cholecalciferol 50 MCG (2000 UT) Caps Take 1 capsule (2,000 Units total) by mouth daily.   clopidogrel 75 MG tablet Commonly known as:  PLAVIX TAKE 1 TABLET(75 MG) BY MOUTH DAILY   clopidogrel 75 MG tablet Commonly known as:  PLAVIX Take 1 tablet (75 mg total) by mouth daily.   fenofibrate 54 MG tablet Take 1 tablet (54 mg total) by mouth daily.   freestyle lancets Use as directed three times a day to check blood sugar.  DX E11.9   FREESTYLE TEST STRIPS test strip Generic drug:  glucose blood USE TO CHECK BLOOD SUGAR THREE TIMES DAILY   furosemide 20 MG tablet Commonly known as:  LASIX TAKE 2 TABLETS BY MOUTH DAILY AS NEEDED FOR FLUID RETENTIONOR SWELLING   isosorbide mononitrate 30 MG 24 hr tablet Commonly known as:  IMDUR TAKE 1 TABLET BY MOUTH DAILY   lisinopril 5 MG tablet Commonly known as:  PRINIVIL,ZESTRIL Take 1 tablet (5 mg total) by mouth daily.   metFORMIN 500 MG 24 hr tablet Commonly known as:  GLUCOPHAGE-XR TAKE 1 TABLET(500 MG) BY MOUTH DAILY WITH SUPPER   metolazone 2.5 MG tablet Commonly known as:  ZAROXOLYN metolazone 2.5 mg tablet   metoprolol tartrate 25 MG tablet Commonly known as:   LOPRESSOR TAKE 1/2 TABLET BY MOUTH TWICE DAILY   nitroGLYCERIN 0.4 MG SL tablet Commonly known as:  NITROSTAT Place 1 tablet (0.4 mg total)  under the tongue every 5 (five) minutes as needed for chest pain.   pantoprazole 40 MG tablet Commonly known as:  PROTONIX TAKE 1 TABLET(40 MG) BY MOUTH DAILY   Pifeltro 100 MG Tabs tablet Generic drug:  doravirine TAKE 1 TABLET(100 MG) BY MOUTH DAILY   pioglitazone 15 MG tablet Commonly known as:  ACTOS TAKE 1 TABLET(15 MG) BY MOUTH DAILY   potassium chloride SA 20 MEQ tablet Commonly known as:  K-DUR,KLOR-CON Take 1 tablet (20 mEq total) by mouth 3 (three) times daily.   Probiotic Daily Caps Take 1 by mouth daily   repaglinide 2 MG tablet Commonly known as:  PRANDIN TAKE 2 TABLETS BY MOUTH BEFORE BREAKFAST, 1 TABLET BY MOUTH BEFORE LUNCH AND 2 TABLETS BY MOUTH BEFORE SUPPER   sodium bicarbonate 650 MG tablet Take 650 mg by mouth 3 (three) times daily.   tamsulosin 0.4 MG Caps capsule Commonly known as:  FLOMAX TK 1 C PO BID   Tivicay 50 MG tablet Generic drug:  dolutegravir TAKE 1 TABLET BY MOUTH DAILY( STOP JULUCA AND PREZCOBIX)   triamcinolone cream 0.1 % Commonly known as:  KENALOG apply twice daily as needed to dry, itchy skin   valACYclovir 500 MG tablet Commonly known as:  VALTREX TAKE 1 TABLET BY MOUTH DAILY   Vascepa 1 g Caps Generic drug:  Icosapent Ethyl TAKE 2 CAPSULES(2 GRAMS) BY MOUTH TWICE DAILY       Allergies: No Known Allergies  Past Medical History:  Diagnosis Date  . Absolute anemia 05/28/2014  . Aortic stenosis 09/17/2015  . Arthritis   . Ascites 11/18/2013  . BELLS PALSY 07/19/2010   Qualifier: Diagnosis of  By: [name], [name]    . CAD (coronary artery disease)    a. s/p CABG in 2003 b. s/p PCI to SVG-PDA in 07/2015 c. 11/2016: cath showing severe native CAD with patent LIMA-LAD and SVG-D1 with 80% stenosis of SVG-OM1-OM2. Initially medical management was recommended --> presented with  recurrent angina --> s/p Synergy DES to proximal body of SVG-OM1-OM2, POBA to distal graft.   Marland Kitchen CAD- S/P PCI SVG-OM1 12/01/16 05/12/2006   Qualifier: Diagnosis of  By: [name], [name]    . Carotid bruit 07/10/2015  . Chest pain 11/22/2016  . Chronic kidney disease   . Chronic renal insufficiency, stage III (moderate) ([place]) 12/02/2016  . Constipation 05/28/2014  . COPD 07/20/2008   Qualifier: Diagnosis of  By: [name], [name]   . DEPRESSION 09/03/2006   Qualifier: Diagnosis of  By: [name], [name]    . Dermatitis 11/27/2012  . Diabetes mellitus   . Diarrhea 11/20/2013  . DM (diabetes mellitus), type 2 with ophthalmic complications ([place]) 9/38/1829   Qualifier: Diagnosis of  By: [name], [name]    . Dry eye syndrome 12/20/2010  . Epicondylitis 06/05/2013   right  . Erectile dysfunction 01/01/2012  . Essential hypertension 05/12/2006   Qualifier: Diagnosis of  By: [name], [name]    . GENITAL HERPES 05/03/2009   Qualifier: Diagnosis of  By: [name], [name]    . GERD 09/03/2006   Qualifier: Diagnosis of  By: [name], [name]    . GERD (gastroesophageal reflux disease)   . HEARING LOSS, SENSORINEURAL 05/12/2006   Qualifier: Diagnosis of  By: [name], [name]    . HEMATOCHEZIA 04/18/2008   Annotation: 9/09 during bout of constipation Qualifier: Diagnosis of  By: [name], [name]    . HIP PAIN, BILATERAL 07/17/2008   Qualifier: Diagnosis  of  By: [name], [name]   . History of depression   . History of kidney stones   . HIV (human immunodeficiency virus infection) ([place]) 1991   on meds since initial dx.   Marland Kitchen HLD (hyperlipidemia) 11/19/2013  . Human immunodeficiency virus (HIV) disease ([place]) 05/12/2006   Qualifier: Diagnosis of  By: [name], [name]    . Hx of CABG 09/03/2006   Annotation: 2003 Qualifier: Diagnosis of  By: [name], [name]    . Hyperkalemia 03/23/2014  . Hyperlipidemia   . Hypertension   . Hyponatremia 03/23/2014  . INGUINAL LYMPHADENOPATHY, RIGHT 04/03/2009    Qualifier: Diagnosis of  By: [name], [name]    . Keratoma 01/24/2015  . KNEE PAIN, BILATERAL 07/17/2008   Qualifier: Diagnosis of  By: [name], [name]   . Lesion of breast 07/21/2015  . Lipodystrophy 12/20/2010  . Memory loss 09/18/2008   Qualifier: Diagnosis of  By: [name], [name]   . Metatarsal deformity 01/24/2015  . Nasal abscess 12/04/2015  . Night sweats 07/06/2012  . Nocturia 03/06/2013  . NSTEMI (non-ST elevated myocardial infarction) ([place]) 11/29/2016  . Pain in joint, ankle and foot 01/19/2015  . Pancreatitis 11/2013   attributed to HIV meds.   . Pedal edema 05/21/2014  . PERIPHERAL VASCULAR DISEASE 07/20/2008   Qualifier: Diagnosis of  By: [name], [name]   . Posterior cervical lymphadenopathy 03/30/2014  . Protein-calorie malnutrition, severe ([place]) 03/23/2014  . SHINGLES, HX OF 05/03/2009   Annotation: R leg Qualifier: Diagnosis of  By: [name], [name]    . STEMI (ST elevation myocardial infarction) ([place]) 07/29/2015  . Unstable angina ([place]) 11/24/2016  . WEIGHT LOSS, ABNORMAL 04/03/2009   Qualifier: Diagnosis of  By: [name], [name]      Past Surgical History:  Procedure Laterality Date  . CARDIAC CATHETERIZATION N/A 07/29/2015   Procedure: Left Heart Cath and Coronary Angiography;  Surgeon: [name], MD;  Location: [facility];  Service: Cardiovascular;  Laterality: N/A;  . CARDIAC CATHETERIZATION N/A 07/29/2015   Procedure: Coronary Stent Intervention;  Surgeon: [name], MD;  Location: [facility];  Service: Cardiovascular;  Laterality: N/A;  . CORONARY ARTERY BYPASS GRAFT  05/2002  . CORONARY STENT INTERVENTION N/A 12/01/2016   Procedure: Coronary Stent Intervention;  Surgeon: [name], MD;  Location: [facility];  Service: Cardiovascular;  Laterality: N/A;  . CORONARY STENT INTERVENTION N/A 10/21/2017   Procedure: CORONARY STENT INTERVENTION;  Surgeon: Jettie Booze, MD;  Location: [facility];  Service:  Cardiovascular;  Laterality: N/A;  . LEFT HEART CATH AND CORS/GRAFTS ANGIOGRAPHY N/A 11/24/2016   Procedure: Left Heart Cath and Cors/Grafts Angiography;  Surgeon: Nelva Bush, MD;  Location: [facility];  Service: Cardiovascular;  Laterality: N/A;  . LEFT HEART CATH AND CORS/GRAFTS ANGIOGRAPHY N/A 12/01/2016   Procedure: Left Heart Cath and Cors/Grafts Angiography;  Surgeon: [name], MD;  Location: [facility];  Service: Cardiovascular;  Laterality: N/A;  . LEFT HEART CATH AND CORS/GRAFTS ANGIOGRAPHY N/A 10/21/2017   Procedure: LEFT HEART CATH AND CORS/GRAFTS ANGIOGRAPHY;  Surgeon: Jettie Booze, MD;  Location: [facility];  Service: Cardiovascular;  Laterality: N/A;  . VASECTOMY      Family History  Problem Relation Age of Onset  . Hyperlipidemia Mother   . Diabetes Mother        paternal grandparents/1 brother  . Hyperlipidemia Father   .  Hypertension Father        paternal grandmother/3 brothers/1 sister  . Arthritis Unknown        mother/father/paternal grandparents  . Breast cancer Maternal Aunt        paternal aunt  . Lung cancer Maternal Aunt   . Heart disease Unknown        parents/maternal grandparents/ 2 brothers  . Stroke Paternal Grandmother   . Mental retardation Sister   . Colon cancer Neg Hx   . Esophageal cancer Neg Hx   . Pancreatic cancer Neg Hx   . Stomach cancer Neg Hx   . Liver disease Neg Hx     Social History:  reports that he has never smoked. He has never used smokeless tobacco. He reports current alcohol use. He reports that he does not use drugs.    Review of Systems    Lipid history: On Lipitor 40 mg, followed by PCP His triglycerides were nearly 410 previously and also taking Vascepa with excellent control  Also has significant history of CAD   Lab Results  Component Value Date   CHOL 109 02/22/2018   HDL 28.70 (L) 02/22/2018   LDLCALC 60 02/22/2018   LDLDIRECT 72.0 11/04/2016   TRIG 103.0 02/22/2018    CHOLHDL 4 02/22/2018            RENAL dysfunction: Followed by nephrologist, renal function was stable but variable  Lab Results  Component Value Date   CREATININE 1.73 (H) 10/20/2018   CREATININE 1.6 (A) 09/30/2018   CREATININE 1.69 (H) 06/04/2018      Hypertension:taking 5 mg lisinopril with 25 mg metoprolol  Followed by cardiologist and blood pressure is usually low normal  BP Readings from Last 3 Encounters:  10/27/18 100/60  09/22/18 138/68  09/08/18 120/66    Diabetic foot exam  in 5/19 shows normal monofilament sensation in the toes and plantar surfaces, no skin lesions or ulcers on the feet and absent pedal pulses    Physical Examination:  BP 100/60 (BP Location: Left Arm, Patient Position: Sitting, Cuff Size: Normal)   Pulse 73   Temp 97.9 F (36.6 C) (Oral)   Ht 5\' 3"  (1.6 m)   Wt 146 lb 6.4 oz (66.4 kg)   SpO2 98%   BMI 25.93 kg/m   No pedal edema  ASSESSMENT:  Diabetes type 2, with BMI 25; has  abdominal obesity   See history of present illness for detailed discussion of current diabetes management, blood sugar patterns and problems identified  He has a stable A1c of 7.3  Although he may still have some postprandial hyperglycemia causing his A1c be to be slightly high he is not monitoring his blood sugars and not clear what his blood sugar patterns are His episode of mild hypoglycemia recently possibly may be related to taking Prandin the night before and he is only taking 500 mg of metformin currently Most likely his low sugar was related to inadequate intake the night before along with taking 2 tablets of Prandin instead of 1  RENAL insufficiency: Variable and needs to be followed by nephrology consistently    PLAN:  Reduce Prandin to 1 tablet at suppertime He needs to get new test strips Continue Prandin otherwise unchanged and also metformin and Actos Does need to start checking his sugars and again reminded him to do this whenever he  remembers even if it is not 2 hours after eating Regular walking Discussed appropriate treatment of hypoglycemia with juice or glucose tablets  and not protein drinks which he had used recently Reassess his blood sugar control in 2 months with fructosamine  There are no Patient Instructions on file for this visit.   Elayne Snare 10/27/2018, 3:03 PM   Note: This office note was prepared with Dragon voice recognition system technology. Any transcriptional errors that result from this process are unintentional.

## 2018-10-27 NOTE — Patient Instructions (Signed)
TAKE ONLY 1 rEPAGLANIDE BEFORE DINNER  CHECK SUGAR  Check blood sugars on waking up 2 days a week  Also check blood sugars about 2 hours after meals and do this after different meals by rotation  Recommended blood sugar levels on waking up are 90-130 and about 2 hours after meal is 130-160  Please bring your blood sugar monitor to each visit, thank you

## 2018-11-11 ENCOUNTER — Other Ambulatory Visit: Payer: Self-pay

## 2018-11-11 MED ORDER — METFORMIN HCL ER 500 MG PO TB24: ORAL_TABLET | ORAL | 3 refills | Status: DC

## 2018-11-15 ENCOUNTER — Other Ambulatory Visit: Payer: Self-pay | Admitting: Endocrinology

## 2018-11-17 ENCOUNTER — Other Ambulatory Visit: Payer: Self-pay | Admitting: Physician Assistant

## 2018-11-18 NOTE — Telephone Encounter (Signed)
Lisinopril refilled.

## 2018-11-19 DIAGNOSIS — R3915 Urgency of urination: Secondary | ICD-10-CM | POA: Diagnosis not present

## 2018-11-19 DIAGNOSIS — R3912 Poor urinary stream: Secondary | ICD-10-CM | POA: Diagnosis not present

## 2018-11-19 DIAGNOSIS — N401 Enlarged prostate with lower urinary tract symptoms: Secondary | ICD-10-CM | POA: Diagnosis not present

## 2018-11-22 ENCOUNTER — Other Ambulatory Visit: Payer: Self-pay

## 2018-11-22 ENCOUNTER — Other Ambulatory Visit: Payer: Self-pay | Admitting: Endocrinology

## 2018-11-25 ENCOUNTER — Ambulatory Visit (INDEPENDENT_AMBULATORY_CARE_PROVIDER_SITE_OTHER): Payer: Medicare Other | Admitting: Podiatry

## 2018-11-25 ENCOUNTER — Encounter: Payer: Self-pay | Admitting: Podiatry

## 2018-11-25 ENCOUNTER — Other Ambulatory Visit: Payer: Self-pay

## 2018-11-25 VITALS — Temp 97.3°F

## 2018-11-25 DIAGNOSIS — M79675 Pain in left toe(s): Secondary | ICD-10-CM | POA: Diagnosis not present

## 2018-11-25 DIAGNOSIS — L84 Corns and callosities: Secondary | ICD-10-CM | POA: Diagnosis not present

## 2018-11-25 DIAGNOSIS — B351 Tinea unguium: Secondary | ICD-10-CM | POA: Diagnosis not present

## 2018-11-25 DIAGNOSIS — E1151 Type 2 diabetes mellitus with diabetic peripheral angiopathy without gangrene: Secondary | ICD-10-CM

## 2018-11-25 DIAGNOSIS — M79674 Pain in right toe(s): Secondary | ICD-10-CM

## 2018-11-25 NOTE — Patient Instructions (Signed)

## 2018-11-25 NOTE — Progress Notes (Signed)
Subjective: Patient presents today for preventative diabetic foot care.  Patient has h/o diabetes with PAD.     Today, he relates tenderness of right great toenail lateral border.   Dorothyann Peng, NP is his PCP and last visit was 09/22/2018.  He is followed by Dr. Elayne Snare for his diabetes and last visit was 10/27/2018.   Current Outpatient Medications:  .  acetaminophen (TYLENOL) 325 MG tablet, Take 2 tablets (650 mg total) by mouth every 4 (four) hours as needed for headache or mild pain., Disp: , Rfl:  .  aspirin 81 MG chewable tablet, Chew 81 mg by mouth daily., Disp: , Rfl:  .  atorvastatin (LIPITOR) 20 MG tablet, TAKE 1 TABLET(20 MG) BY MOUTH DAILY, Disp: 90 tablet, Rfl: 1 .  b complex vitamins tablet, Take 1 tablet by mouth daily., Disp: 30 tablet, Rfl: 11 .  Cholecalciferol 2000 units CAPS, Take 1 capsule (2,000 Units total) by mouth daily., Disp: 30 each, Rfl: 11 .  clopidogrel (PLAVIX) 75 MG tablet, TAKE 1 TABLET(75 MG) BY MOUTH DAILY, Disp: 30 tablet, Rfl: 11 .  clopidogrel (PLAVIX) 75 MG tablet, Take 1 tablet (75 mg total) by mouth daily., Disp: 30 tablet, Rfl: 11 .  fenofibrate 54 MG tablet, Take 1 tablet (54 mg total) by mouth daily., Disp: 90 tablet, Rfl: 3 .  FREESTYLE TEST STRIPS test strip, USE TO CHECK BLOOD SUGAR THREE TIMES DAILY, Disp: 300 each, Rfl: 3 .  furosemide (LASIX) 20 MG tablet, TAKE 2 TABLETS BY MOUTH DAILY AS NEEDED FOR FLUID RETENTIONOR SWELLING, Disp: 90 tablet, Rfl: 1 .  isosorbide mononitrate (IMDUR) 30 MG 24 hr tablet, TAKE 1 TABLET BY MOUTH DAILY, Disp: 90 tablet, Rfl: 2 .  Lancets (FREESTYLE) lancets, Use as directed three times a day to check blood sugar.  DX E11.9, Disp: 100 each, Rfl: 5 .  lisinopril (PRINIVIL,ZESTRIL) 5 MG tablet, TAKE 1 TABLET(5 MG) BY MOUTH DAILY, Disp: 90 tablet, Rfl: 3 .  metFORMIN (GLUCOPHAGE-XR) 500 MG 24 hr tablet, TAKE 1 TABLET(500 MG) BY MOUTH DAILY WITH SUPPER, Disp: 30 tablet, Rfl: 3 .  metolazone (ZAROXOLYN) 2.5 MG  tablet, metolazone 2.5 mg tablet, Disp: , Rfl:  .  metoprolol tartrate (LOPRESSOR) 25 MG tablet, TAKE 1/2 TABLET BY MOUTH TWICE DAILY, Disp: 60 tablet, Rfl: 11 .  nitroGLYCERIN (NITROSTAT) 0.4 MG SL tablet, Place 1 tablet (0.4 mg total) under the tongue every 5 (five) minutes as needed for chest pain., Disp: 25 tablet, Rfl: 3 .  pantoprazole (PROTONIX) 40 MG tablet, TAKE 1 TABLET(40 MG) BY MOUTH DAILY, Disp: 30 tablet, Rfl: 11 .  PIFELTRO 100 MG TABS tablet, TAKE 1 TABLET(100 MG) BY MOUTH DAILY, Disp: 30 tablet, Rfl: 11 .  pioglitazone (ACTOS) 15 MG tablet, TAKE 1 TABLET(15 MG) BY MOUTH DAILY, Disp: 30 tablet, Rfl: 3 .  potassium chloride SA (K-DUR,KLOR-CON) 20 MEQ tablet, Take 1 tablet (20 mEq total) by mouth 3 (three) times daily., Disp: 270 tablet, Rfl: 3 .  Probiotic Product (PROBIOTIC DAILY) CAPS, Take 1 by mouth daily, Disp: 30 capsule, Rfl: 11 .  repaglinide (PRANDIN) 2 MG tablet, TAKE 2 TABLETS BY MOUTH BEFORE BREAKFAST, 1 TABLET BY MOUTH BEFORE LUNCH AND 2 TABLETS BY MOUTH BEFORE SUPPER, Disp: 450 tablet, Rfl: 3 .  sodium bicarbonate 650 MG tablet, Take 650 mg by mouth 3 (three) times daily., Disp: , Rfl:  .  tamsulosin (FLOMAX) 0.4 MG CAPS capsule, TK 1 C PO BID, Disp: , Rfl: 11 .  TIVICAY 50 MG  tablet, TAKE 1 TABLET BY MOUTH DAILY( STOP JULUCA AND PREZCOBIX), Disp: 30 tablet, Rfl: 11 .  triamcinolone cream (KENALOG) 0.1 %, apply twice daily as needed to dry, itchy skin, Disp: , Rfl: 2 .  valACYclovir (VALTREX) 500 MG tablet, TAKE 1 TABLET BY MOUTH DAILY, Disp: 30 tablet, Rfl: 5 .  VASCEPA 1 g CAPS, TAKE 2 CAPSULES(2 GRAMS) BY MOUTH TWICE DAILY, Disp: 120 capsule, Rfl: 0   No Known Allergies   Objective:  Vascular Examination: Capillary refill time <4 seconds  x 10 digits.  Dorsalis pedis pulses nonpalpable b/l.  Posterior tibial pulses nonpalpable.  Digital hair absent x 10 digits.  Skin temperature gradient warm to cool  B/l.  No ischemic changes.   Dermatological  Examination: Skin thin, shiny and atrophic b/l.  Toenails 1-5 b/l discolored, thick, dystrophic with subungual debris and pain with palpation to nailbeds due to thickness of nails.  Incurvated nailplate right great toe lateral border with tenderness to palpation. Mild nail border hypertrophy noted proximally.  No erythema, no edema, no drainage noted.  Hyperkeratotic lesion submetatarsal head 5 left foot. No erythema, no edema, no drainage, no flocculence noted.  Musculoskeletal: Muscle strength 5/5 to all LE muscle groups  Neurological: Sensation intact with 10 gram monofilament.  Vibratory sensation intact.  Assessment: 1. Painful onychomycosis toenails 1-5 b/l 2. Callus submetatarsal head 5 left foot 3. NIDDM with Peripheral arterial disease  Plan: 1. Toenails 1-5 b/l were debrided in length and girth without iatrogenic bleeding. Offending nail border debrided and curretaged  right great toe lateral border. Border cleansed with alcohol. Antibiotic ointment applied. No further treatment required by patient. Calluses pared submetatarsal head(s) 5 left foot utilizing sterile scalpel blade without incident. 2. Patient to continue soft, supportive shoe gear daily. 3. Patient to report any pedal injuries to medical professional immediately. 4. Follow up 3 months. 5. Patient/POA to call should there be a concern in the interim.

## 2018-11-30 ENCOUNTER — Encounter: Payer: Self-pay | Admitting: Adult Health

## 2018-12-09 ENCOUNTER — Encounter: Payer: Self-pay | Admitting: Gastroenterology

## 2018-12-19 ENCOUNTER — Other Ambulatory Visit: Payer: Self-pay | Admitting: Endocrinology

## 2018-12-28 ENCOUNTER — Ambulatory Visit: Payer: Medicare Other

## 2018-12-29 ENCOUNTER — Other Ambulatory Visit: Payer: Medicare Other

## 2018-12-31 ENCOUNTER — Ambulatory Visit: Payer: Medicare Other | Admitting: Endocrinology

## 2019-01-04 ENCOUNTER — Other Ambulatory Visit: Payer: Self-pay | Admitting: Adult Health

## 2019-01-09 ENCOUNTER — Other Ambulatory Visit: Payer: Self-pay | Admitting: Endocrinology

## 2019-01-12 ENCOUNTER — Ambulatory Visit: Payer: Medicare Other | Admitting: Gastroenterology

## 2019-01-20 ENCOUNTER — Other Ambulatory Visit: Payer: Self-pay | Admitting: Endocrinology

## 2019-01-25 ENCOUNTER — Other Ambulatory Visit: Payer: Self-pay

## 2019-01-25 ENCOUNTER — Other Ambulatory Visit (INDEPENDENT_AMBULATORY_CARE_PROVIDER_SITE_OTHER): Payer: Medicare Other

## 2019-01-25 DIAGNOSIS — E1165 Type 2 diabetes mellitus with hyperglycemia: Secondary | ICD-10-CM | POA: Diagnosis not present

## 2019-01-25 LAB — GLUCOSE, RANDOM: Glucose, Bld: 138 mg/dL — ABNORMAL HIGH (ref 70–99)

## 2019-01-26 LAB — FRUCTOSAMINE: Fructosamine: 253 umol/L (ref 0–285)

## 2019-01-27 ENCOUNTER — Encounter: Payer: Self-pay | Admitting: Endocrinology

## 2019-01-27 ENCOUNTER — Other Ambulatory Visit: Payer: Self-pay

## 2019-01-28 ENCOUNTER — Other Ambulatory Visit: Payer: Self-pay | Admitting: Cardiology

## 2019-01-28 ENCOUNTER — Ambulatory Visit (INDEPENDENT_AMBULATORY_CARE_PROVIDER_SITE_OTHER): Payer: Medicare Other | Admitting: Endocrinology

## 2019-01-28 DIAGNOSIS — E1165 Type 2 diabetes mellitus with hyperglycemia: Secondary | ICD-10-CM

## 2019-01-28 NOTE — Progress Notes (Signed)
Patient ID: [name], male   DOB: 07-Jul-1946, 73 y.o.   MRN: [id number]           Reason for Appointment: Follow-up for Type 2 Diabetes  Today's office visit was provided via telemedicine using a telephone call to the patient Patient has been explained the limitations of evaluation and management by telemedicine and the availability of in person appointments.  The patient understood the limitations and agreed to proceed. Patient also understood that the telehealth visit is billable.  Location of the patient: Home  Location of the provider: Office Only the patient and myself were participating in the encounter  History of Present Illness:          Date of diagnosis of type 2 diabetes mellitus: 2013        Background history:  He only mildly increased blood sugar levels at the time of diagnosis and A1c was 7.1  He had been treated initially with metformin but this was stopped subsequently when his renal function was worse  He was subsequently treated with glipizide but his blood sugars had been poorly controlled in 2016 with glipizide alone Since his A1c has been persistently over 9% he was started on Januvia 50 mg in 01/2015 At initial consultation he had a high A1c of 9.5; he was switched from glipizide to Prandin in 02/2015 Previously when he had high fasting readings he was started on Toujeo on 06/11/16, however this was stopped in April 2018 when he was getting hypoglycemia  Recent history:    Oral hypoglycemic drugs: Prandin 4 mg at breakfast, 2 mg at dinner and 2 mg before lunch , Actos 15 mg daily, metformin ER 500 mg daily   His A1c is usually over 7% and last 7.3 compared to 7.1, previous range 6.3-7.6 Fructosamine is 253, previously had been 245  Current blood sugar patterns and problems identified:  He says he is checking her sugars now although on his previous visit he was told to buy new test strips which were expired  He likely has the incorrect date programmed  on his meter because he could not tell on his meter which readings were recent  However by recall his blood sugars are mostly near normal as discussed below  Unclear how often he checks his sugars after meals  His lab fasting glucose was relatively higher than his home readings at 138  He is trying to be compliant with taking Prandin before each meal  He says that he will occasionally have low blood sugars in the afternoon but only if he is getting late for lunch and is outside doing chores  He is trying to do a little walking but not very much  He thinks his weight is about 142 which is slightly lower   Side effects from medications have been: None   Glucose monitoring:  done usually 0-1  times a day         Glucometer:  FreeStyle     Blood Glucose readings  Sugar readings at home recently 88-166, overall no readings over 200 His meter is not programmed to the right date  Dietician visit, most recent: 8/15 DIET:  has been trying to reduce fried/usually not eating high fat  foods; eating oatmeal in the morning  greek yogurt               Dinner usually at 6 pm Exercise:  walking 1 mile  .  Weight history:   Wt Readings from Last  3 Encounters:  10/27/18 146 lb 6.4 oz (66.4 kg)  09/22/18 149 lb (67.6 kg)  09/08/18 147 lb 6.4 oz (66.9 kg)    Glycemic control:   Lab Results  Component Value Date   HGBA1C 7.3 (H) 10/20/2018   HGBA1C 7.1 (H) 06/04/2018   HGBA1C 7.1 (H) 02/22/2018   Lab Results  Component Value Date   MICROALBUR 3.4 (H) 07/10/2017   LDLCALC 60 02/22/2018   CREATININE 1.73 (H) 10/20/2018    Other active problems: See review of systems   Lab on 01/25/2019  Component Date Value Ref Range Status   Glucose, Bld 01/25/2019 138* 70 - 99 mg/dL Final   Fructosamine 01/25/2019 253  0 - 285 umol/L Final   Comment: Published reference interval for apparently healthy subjects between age 17 and 62 is 68 - 285 umol/L and in a poorly controlled diabetic  population is 228 - 563 umol/L with a mean of 396 umol/L.        Allergies as of 01/28/2019   No Known Allergies     Medication List       Accurate as of January 28, 2019  9:57 AM. If you have any questions, ask your nurse or doctor.        acetaminophen 325 MG tablet Commonly known as: TYLENOL Take 2 tablets (650 mg total) by mouth every 4 (four) hours as needed for headache or mild pain.   aspirin 81 MG chewable tablet Chew 81 mg by mouth daily.   atorvastatin 20 MG tablet Commonly known as: LIPITOR TAKE 1 TABLET(20 MG) BY MOUTH DAILY   b complex vitamins tablet Take 1 tablet by mouth daily.   Cholecalciferol 50 MCG (2000 UT) Caps Take 1 capsule (2,000 Units total) by mouth daily.   clopidogrel 75 MG tablet Commonly known as: PLAVIX TAKE 1 TABLET(75 MG) BY MOUTH DAILY   clopidogrel 75 MG tablet Commonly known as: PLAVIX Take 1 tablet (75 mg total) by mouth daily.   fenofibrate 54 MG tablet Take 1 tablet (54 mg total) by mouth daily.   freestyle lancets Use as directed three times a day to check blood sugar.  DX E11.9   FREESTYLE TEST STRIPS test strip Generic drug: glucose blood USE TO CHECK BLOOD SUGAR THREE TIMES DAILY   furosemide 20 MG tablet Commonly known as: LASIX TAKE 2 TABLETS BY MOUTH DAILY AS NEEDED FOR FLUID RETENTIONOR SWELLING   isosorbide mononitrate 30 MG 24 hr tablet Commonly known as: IMDUR TAKE 1 TABLET BY MOUTH DAILY   lisinopril 5 MG tablet Commonly known as: ZESTRIL TAKE 1 TABLET(5 MG) BY MOUTH DAILY   metFORMIN 500 MG 24 hr tablet Commonly known as: GLUCOPHAGE-XR TAKE 1 TABLET(500 MG) BY MOUTH DAILY WITH SUPPER   metolazone 2.5 MG tablet Commonly known as: ZAROXOLYN metolazone 2.5 mg tablet   metoprolol tartrate 25 MG tablet Commonly known as: LOPRESSOR TAKE 1/2 TABLET BY MOUTH TWICE DAILY   nitroGLYCERIN 0.4 MG SL tablet Commonly known as: NITROSTAT PLACE 1 TABLET UNDER THE TONGUE EVERY 5 MINUTES AS NEEDED FOR CHEST  PAIN What changed: See the new instructions. Changed by: Kirk Ruths, MD   pantoprazole 40 MG tablet Commonly known as: PROTONIX TAKE 1 TABLET(40 MG) BY MOUTH DAILY   Pifeltro 100 MG Tabs tablet Generic drug: doravirine TAKE 1 TABLET(100 MG) BY MOUTH DAILY   pioglitazone 15 MG tablet Commonly known as: ACTOS TAKE 1 TABLET(15 MG) BY MOUTH DAILY   potassium chloride SA 20 MEQ tablet Commonly known  as: K-DUR Take 1 tablet (20 mEq total) by mouth 3 (three) times daily.   Probiotic Daily Caps Take 1 by mouth daily   repaglinide 2 MG tablet Commonly known as: PRANDIN TAKE 2 TABLETS BY MOUTH BEFORE BREAKFAST, 1 TABLET BY MOUTH BEFORE LUNCH AND 2 TABLETS BY MOUTH BEFORE SUPPER   sodium bicarbonate 650 MG tablet Take 650 mg by mouth 3 (three) times daily.   tamsulosin 0.4 MG Caps capsule Commonly known as: FLOMAX TK 1 C PO BID   Tivicay 50 MG tablet Generic drug: dolutegravir TAKE 1 TABLET BY MOUTH DAILY( STOP JULUCA AND PREZCOBIX)   triamcinolone cream 0.1 % Commonly known as: KENALOG apply twice daily as needed to dry, itchy skin   valACYclovir 500 MG tablet Commonly known as: VALTREX TAKE 1 TABLET BY MOUTH DAILY   Vascepa 1 g Caps Generic drug: Icosapent Ethyl TAKE 2 CAPSULES(2 GRAMS) BY MOUTH TWICE DAILY       Allergies: No Known Allergies  Past Medical History:  Diagnosis Date   Absolute anemia 05/28/2014   Aortic stenosis 09/17/2015   Arthritis    Ascites 11/18/2013   BELLS PALSY 07/19/2010   Qualifier: Diagnosis of  By: [name], [name]     CAD (coronary artery disease)    a. s/p CABG in 2003 b. s/p PCI to SVG-PDA in 07/2015 c. 11/2016: cath showing severe native CAD with patent LIMA-LAD and SVG-D1 with 80% stenosis of SVG-OM1-OM2. Initially medical management was recommended --> presented with recurrent angina --> s/p Synergy DES to proximal body of SVG-OM1-OM2, POBA to distal graft.    CAD- S/P PCI SVG-OM1 12/01/16 05/12/2006   Qualifier:  Diagnosis of  By: [name], [name]     Carotid bruit 07/10/2015   Chest pain 11/22/2016   Chronic kidney disease    Chronic renal insufficiency, stage III (moderate) ([place]) 12/02/2016   Constipation 05/28/2014   COPD 07/20/2008   Qualifier: Diagnosis of  By: [name], [name]    DEPRESSION 09/03/2006   Qualifier: Diagnosis of  By: [name], [name]     Dermatitis 11/27/2012   Diabetes mellitus    Diarrhea 11/20/2013   DM (diabetes mellitus), type 2 with ophthalmic complications ([place]) 2/92/4462   Qualifier: Diagnosis of  By: [name], [name]     Dry eye syndrome 12/20/2010   Epicondylitis 06/05/2013   right   Erectile dysfunction 01/01/2012   Essential hypertension 05/12/2006   Qualifier: Diagnosis of  By: [name], [name]     GENITAL HERPES 05/03/2009   Qualifier: Diagnosis of  By: [name], [name]     GERD 09/03/2006   Qualifier: Diagnosis of  By: [name], [name]     GERD (gastroesophageal reflux disease)    HEARING LOSS, SENSORINEURAL 05/12/2006   Qualifier: Diagnosis of  By: [name], [name]     HEMATOCHEZIA 04/18/2008   Annotation: 9/09 during bout of constipation Qualifier: Diagnosis of  By: [name], [name]     HIP PAIN, BILATERAL 07/17/2008   Qualifier: Diagnosis of  By: [name], [name]    History of depression    History of kidney stones    HIV (human immunodeficiency virus infection) ([place]) 1991   on meds since initial dx.    HLD (hyperlipidemia) 11/19/2013   Human immunodeficiency virus (HIV) disease ([place]) 05/12/2006   Qualifier: Diagnosis of  By: [name], [name]     Hx of CABG 09/03/2006   Annotation: 2003 Qualifier: Diagnosis of  By: [name], Jenny Reichmann  Hyperkalemia 03/23/2014   Hyperlipidemia    Hypertension    Hyponatremia 03/23/2014   INGUINAL LYMPHADENOPATHY, RIGHT 04/03/2009   Qualifier: Diagnosis of  By: [name], [name] 01/24/2015   KNEE PAIN, BILATERAL 07/17/2008   Qualifier: Diagnosis of  By: [name], [name]      Lesion of breast 07/21/2015   Lipodystrophy 12/20/2010   Memory loss 09/18/2008   Qualifier: Diagnosis of  By: [name], [name]    Metatarsal deformity 01/24/2015   Nasal abscess 12/04/2015   Night sweats 07/06/2012   Nocturia 03/06/2013   NSTEMI (non-ST elevated myocardial infarction) ([place]) 11/29/2016   Pain in joint, ankle and foot 01/19/2015   Pancreatitis 11/2013   attributed to HIV meds.    Pedal edema 05/21/2014   PERIPHERAL VASCULAR DISEASE 07/20/2008   Qualifier: Diagnosis of  By: [name], [name]    Posterior cervical lymphadenopathy 03/30/2014   Protein-calorie malnutrition, severe ([place]) 03/23/2014   SHINGLES, HX OF 05/03/2009   Annotation: R leg Qualifier: Diagnosis of  By: [name], [name]     STEMI (ST elevation myocardial infarction) ([place]) 07/29/2015   Unstable angina ([place]) 11/24/2016   WEIGHT LOSS, ABNORMAL 04/03/2009   Qualifier: Diagnosis of  By: [name], [name]      Past Surgical History:  Procedure Laterality Date   CARDIAC CATHETERIZATION N/A 07/29/2015   Procedure: Left Heart Cath and Coronary Angiography;  Surgeon: [name], MD;  Location: [facility];  Service: Cardiovascular;  Laterality: N/A;   CARDIAC CATHETERIZATION N/A 07/29/2015   Procedure: Coronary Stent Intervention;  Surgeon: [name], MD;  Location: [facility];  Service: Cardiovascular;  Laterality: N/A;   CORONARY ARTERY BYPASS GRAFT  05/2002   CORONARY STENT INTERVENTION N/A 12/01/2016   Procedure: Coronary Stent Intervention;  Surgeon: [name], MD;  Location: [facility];  Service: Cardiovascular;  Laterality: N/A;   CORONARY STENT INTERVENTION N/A 10/21/2017   Procedure: CORONARY STENT INTERVENTION;  Surgeon: Jettie Booze, MD;  Location: [facility];  Service: Cardiovascular;  Laterality: N/A;   LEFT HEART CATH AND CORS/GRAFTS ANGIOGRAPHY N/A 11/24/2016   Procedure: Left Heart Cath and Cors/Grafts Angiography;  Surgeon:  Nelva Bush, MD;  Location: [facility];  Service: Cardiovascular;  Laterality: N/A;   LEFT HEART CATH AND CORS/GRAFTS ANGIOGRAPHY N/A 12/01/2016   Procedure: Left Heart Cath and Cors/Grafts Angiography;  Surgeon: [name], MD;  Location: [facility];  Service: Cardiovascular;  Laterality: N/A;   LEFT HEART CATH AND CORS/GRAFTS ANGIOGRAPHY N/A 10/21/2017   Procedure: LEFT HEART CATH AND CORS/GRAFTS ANGIOGRAPHY;  Surgeon: Jettie Booze, MD;  Location: [facility];  Service: Cardiovascular;  Laterality: N/A;   VASECTOMY      Family History  Problem Relation Age of Onset   Hyperlipidemia Mother    Diabetes Mother        paternal grandparents/1 brother   Hyperlipidemia Father    Hypertension Father        paternal grandmother/3 brothers/1 sister   Arthritis Unknown        mother/father/paternal grandparents   Breast cancer Maternal Aunt        paternal aunt   Lung cancer Maternal Aunt    Heart disease Unknown        parents/maternal grandparents/ 2 brothers   Stroke Paternal Grandmother    Mental retardation Sister    Colon cancer Neg Hx  Esophageal cancer Neg Hx    Pancreatic cancer Neg Hx    Stomach cancer Neg Hx    Liver disease Neg Hx     Social History:  reports that he has never smoked. He has never used smokeless tobacco. He reports current alcohol use. He reports that he does not use drugs.    Review of Systems    Lipid history: On Lipitor 40 mg, followed by PCP His triglycerides were nearly 410 previously and also taking Vascepa with excellent control  Also has significant history of CAD   Lab Results  Component Value Date   CHOL 109 02/22/2018   HDL 28.70 (L) 02/22/2018   LDLCALC 60 02/22/2018   LDLDIRECT 72.0 11/04/2016   TRIG 103.0 02/22/2018   CHOLHDL 4 02/22/2018            RENAL dysfunction: Followed by nephrologist, renal function was stable but variable  Lab Results  Component Value Date    CREATININE 1.73 (H) 10/20/2018   CREATININE 1.6 (A) 09/30/2018   CREATININE 1.69 (H) 06/04/2018      Hypertension:taking 5 mg lisinopril with 25 mg metoprolol  Followed by cardiologist and blood pressure is usually low normal  BP Readings from Last 3 Encounters:  10/27/18 100/60  09/22/18 138/68  09/08/18 120/66    Diabetic foot exam  in 5/19 shows normal monofilament sensation in the toes and plantar surfaces, no skin lesions or ulcers on the feet and absent pedal pulses    Physical Examination:  There were no vitals taken for this visit.  No pedal edema  ASSESSMENT:  Diabetes type 2, with BMI 25; has  abdominal obesity   See history of present illness for detailed discussion of current diabetes management, blood sugar patterns and problems identified  On his previous visit had A1c of 7.3 Not clear if his A1c is accurate since his fructosamine is only 253 and usually does not have significantly high readings  He is doing well with his regimen of Prandin before meals, Actos and metformin low-dose His only episodes of low blood sugars are when he is skipping his meals and outside doing chores However reportedly his blood sugars are fairly good recently, monitor not reviewed  RENAL insufficiency: Continue follow-up with nephrologist      PLAN:  He will try to be walking as much as possible Consistent diet with some protein at each meal including breakfast He will try to be consistent with having a snack if he is not able to eat lunch and is active during the day No change in Prandin or Actos Recheck A1c on the next visit  Total telephone encounter visit time =8 minutes  There are no Patient Instructions on file for this visit.   Elayne Snare 01/28/2019, 9:57 AM   Note: This office note was prepared with Dragon voice recognition system technology. Any transcriptional errors that result from this process are unintentional.

## 2019-02-01 ENCOUNTER — Encounter: Payer: Self-pay | Admitting: Adult Health

## 2019-02-02 ENCOUNTER — Encounter: Payer: Self-pay | Admitting: Adult Health

## 2019-02-02 ENCOUNTER — Ambulatory Visit: Payer: Medicare Other

## 2019-02-02 ENCOUNTER — Other Ambulatory Visit: Payer: Self-pay

## 2019-02-02 ENCOUNTER — Ambulatory Visit (INDEPENDENT_AMBULATORY_CARE_PROVIDER_SITE_OTHER): Payer: Medicare Other | Admitting: Adult Health

## 2019-02-02 DIAGNOSIS — R197 Diarrhea, unspecified: Secondary | ICD-10-CM

## 2019-02-02 NOTE — Progress Notes (Signed)
Virtual Visit via Telephone Note  I connected with Khalifa Knecht Duplessis on 02/02/19 at  1:00 PM EDT by telephone and verified that I am speaking with the correct person using two identifiers.   I discussed the limitations, risks, security and privacy concerns of performing an evaluation and management service by telephone and the availability of in person appointments. I also discussed with the patient that there may be a patient responsible charge related to this service. The patient expressed understanding and agreed to proceed.  Location patient: home Location provider: work or home office Participants present for the call: patient, provider Patient did not have a visit in the prior 7 days to address this/these issue(s).   History of Present Illness: 73 year old who is being evaluated today for an acute complaint of diarrhea.  Reports he has had diarrhea on and off for the last 2 weeks with more diarrhea than formed stools.  Reports watery diarrhea and has approximately 4-5 BM a day.  He denies abdominal pain or blood in stool.  He has not had any recent antibiotics or changes in food or medications.  He has not had any fevers or chills.  Has been using Imodium this seems to resolve the diarrhea for a short period time.     Observations/Objective: Patient sounds cheerful and well on the phone. I do not appreciate any SOB. Speech and thought processing are grossly intact. Patient reported vitals:  Assessment and Plan: 1. Diarrhea, unspecified type -Does not sound infectious.  We will have him trial Pepto-Bismol and probiotics for the next week.  He was advised to follow-up if diarrhea has not resolved.  Also advised to stay hydrated and clear liquid diet for the next 24 hours.  Follow Up Instructions:  I did not refer this patient for an OV in the next 24 hours for this/these issue(s).  I discussed the assessment and treatment plan with the patient. The patient was provided an opportunity  to ask questions and all were answered. The patient agreed with the plan and demonstrated an understanding of the instructions.   The patient was advised to call back or seek an in-person evaluation if the symptoms worsen or if the condition fails to improve as anticipated.  I provided 23 minutes of non-face-to-face time during this encounter.   Dorothyann Peng, NP

## 2019-02-02 NOTE — Telephone Encounter (Signed)
Pt now scheduled to see Tommi Rumps today at 1 PM.  Nothing further needed.

## 2019-02-13 ENCOUNTER — Other Ambulatory Visit: Payer: Self-pay | Admitting: Cardiology

## 2019-02-13 ENCOUNTER — Other Ambulatory Visit: Payer: Self-pay | Admitting: Adult Health

## 2019-02-16 ENCOUNTER — Other Ambulatory Visit: Payer: Self-pay | Admitting: Internal Medicine

## 2019-02-17 ENCOUNTER — Other Ambulatory Visit: Payer: Medicare Other

## 2019-02-17 ENCOUNTER — Other Ambulatory Visit: Payer: Self-pay

## 2019-02-17 DIAGNOSIS — B20 Human immunodeficiency virus [HIV] disease: Secondary | ICD-10-CM

## 2019-02-17 DIAGNOSIS — E1165 Type 2 diabetes mellitus with hyperglycemia: Secondary | ICD-10-CM

## 2019-02-18 LAB — T-HELPER CELL (CD4) - (RCID CLINIC ONLY)
CD4 % Helper T Cell: 50 % (ref 33–65)
CD4 T Cell Abs: 358 /uL — ABNORMAL LOW (ref 400–1790)

## 2019-02-19 ENCOUNTER — Other Ambulatory Visit: Payer: Self-pay | Admitting: Endocrinology

## 2019-02-21 ENCOUNTER — Other Ambulatory Visit: Payer: Self-pay

## 2019-02-21 MED ORDER — VASCEPA 1 G PO CAPS: ORAL_CAPSULE | ORAL | 2 refills | Status: DC

## 2019-02-23 ENCOUNTER — Encounter: Payer: Self-pay | Admitting: Adult Health

## 2019-02-23 LAB — COMPLETE METABOLIC PANEL WITH GFR
AG Ratio: 1.5 (calc) (ref 1.0–2.5)
ALT: 25 U/L (ref 9–46)
AST: 19 U/L (ref 10–35)
Albumin: 3.8 g/dL (ref 3.6–5.1)
Alkaline phosphatase (APISO): 50 U/L (ref 35–144)
BUN/Creatinine Ratio: 12 (calc) (ref 6–22)
BUN: 22 mg/dL (ref 7–25)
CO2: 25 mmol/L (ref 20–32)
Calcium: 9.5 mg/dL (ref 8.6–10.3)
Chloride: 109 mmol/L (ref 98–110)
Creat: 1.83 mg/dL — ABNORMAL HIGH (ref 0.70–1.18)
GFR, Est African American: 42 mL/min/{1.73_m2} — ABNORMAL LOW (ref >=60)
GFR, Est Non African American: 37 mL/min/{1.73_m2} — ABNORMAL LOW (ref >=60)
Globulin: 2.5 g/dL (calc) (ref 1.9–3.7)
Glucose, Bld: 139 mg/dL — ABNORMAL HIGH (ref 65–99)
Potassium: 5.2 mmol/L (ref 3.5–5.3)
Sodium: 141 mmol/L (ref 135–146)
Total Bilirubin: 0.4 mg/dL (ref 0.2–1.2)
Total Protein: 6.3 g/dL (ref 6.1–8.1)

## 2019-02-23 LAB — CBC WITH DIFFERENTIAL/PLATELET
Absolute Monocytes: 413 cells/uL (ref 200–950)
Basophils Absolute: 31 cells/uL (ref 0–200)
Basophils Relative: 0.6 %
Eosinophils Absolute: 581 cells/uL — ABNORMAL HIGH (ref 15–500)
Eosinophils Relative: 11.4 %
HCT: 36.6 % — ABNORMAL LOW (ref 38.5–50.0)
Hemoglobin: 12.1 g/dL — ABNORMAL LOW (ref 13.2–17.1)
Lymphs Abs: 785 cells/uL — ABNORMAL LOW (ref 850–3900)
MCH: 31.7 pg (ref 27.0–33.0)
MCHC: 33.1 g/dL (ref 32.0–36.0)
MCV: 95.8 fL (ref 80.0–100.0)
MPV: 10.4 fL (ref 7.5–12.5)
Monocytes Relative: 8.1 %
Neutro Abs: 3290 cells/uL (ref 1500–7800)
Neutrophils Relative %: 64.5 %
Platelets: 183 10*3/uL (ref 140–400)
RBC: 3.82 10*6/uL — ABNORMAL LOW (ref 4.20–5.80)
RDW: 13.2 % (ref 11.0–15.0)
Total Lymphocyte: 15.4 %
WBC: 5.1 10*3/uL (ref 3.8–10.8)

## 2019-02-23 LAB — HIV-1 RNA QUANT-NO REFLEX-BLD
HIV 1 RNA Quant: <20 copies/mL — AB
HIV-1 RNA Quant, Log: <1.3 Log copies/mL — AB

## 2019-02-25 ENCOUNTER — Ambulatory Visit (INDEPENDENT_AMBULATORY_CARE_PROVIDER_SITE_OTHER): Payer: Medicare Other | Admitting: Podiatry

## 2019-02-25 ENCOUNTER — Other Ambulatory Visit: Payer: Self-pay

## 2019-02-25 ENCOUNTER — Encounter: Payer: Self-pay | Admitting: Podiatry

## 2019-02-25 DIAGNOSIS — E119 Type 2 diabetes mellitus without complications: Secondary | ICD-10-CM

## 2019-02-25 DIAGNOSIS — L84 Corns and callosities: Secondary | ICD-10-CM

## 2019-02-25 DIAGNOSIS — B351 Tinea unguium: Secondary | ICD-10-CM

## 2019-02-25 DIAGNOSIS — M79675 Pain in left toe(s): Secondary | ICD-10-CM | POA: Diagnosis not present

## 2019-02-25 DIAGNOSIS — E1151 Type 2 diabetes mellitus with diabetic peripheral angiopathy without gangrene: Secondary | ICD-10-CM

## 2019-02-25 DIAGNOSIS — M79674 Pain in right toe(s): Secondary | ICD-10-CM | POA: Diagnosis not present

## 2019-02-25 NOTE — Patient Instructions (Signed)
Diabetes Mellitus and Foot Care Foot care is an important part of your health, especially when you have diabetes. Diabetes may cause you to have problems because of poor blood flow (circulation) to your feet and legs, which can cause your skin to:  Become thinner and drier.  Break more easily.  Heal more slowly.  Peel and crack. You may also have nerve damage (neuropathy) in your legs and feet, causing decreased feeling in them. This means that you may not notice minor injuries to your feet that could lead to more serious problems. Noticing and addressing any potential problems early is the best way to prevent future foot problems. How to care for your feet Foot hygiene  Wash your feet daily with warm water and mild soap. Do not use hot water. Then, pat your feet and the areas between your toes until they are completely dry. Do not soak your feet as this can dry your skin.  Trim your toenails straight across. Do not dig under them or around the cuticle. File the edges of your nails with an emery board or nail file.  Apply a moisturizing lotion or petroleum jelly to the skin on your feet and to dry, brittle toenails. Use lotion that does not contain alcohol and is unscented. Do not apply lotion between your toes. Shoes and socks  Wear clean socks or stockings every day. Make sure they are not too tight. Do not wear knee-high stockings since they may decrease blood flow to your legs.  Wear shoes that fit properly and have enough cushioning. Always look in your shoes before you put them on to be sure there are no objects inside.  To break in new shoes, wear them for just a few hours a day. This prevents injuries on your feet. Wounds, scrapes, corns, and calluses  Check your feet daily for blisters, cuts, bruises, sores, and redness. If you cannot see the bottom of your feet, use a mirror or ask someone for help.  Do not cut corns or calluses or try to remove them with medicine.  If you  find a minor scrape, cut, or break in the skin on your feet, keep it and the skin around it clean and dry. You may clean these areas with mild soap and water. Do not clean the area with peroxide, alcohol, or iodine.  If you have a wound, scrape, corn, or callus on your foot, look at it several times a day to make sure it is healing and not infected. Check for: ? Redness, swelling, or pain. ? Fluid or blood. ? Warmth. ? Pus or a bad smell. General instructions  Do not cross your legs. This may decrease blood flow to your feet.  Do not use heating pads or hot water bottles on your feet. They may burn your skin. If you have lost feeling in your feet or legs, you may not know this is happening until it is too late.  Protect your feet from hot and cold by wearing shoes, such as at the beach or on hot pavement.  Schedule a complete foot exam at least once a year (annually) or more often if you have foot problems. If you have foot problems, report any cuts, sores, or bruises to your health care provider immediately. Contact a health care provider if:  You have a medical condition that increases your risk of infection and you have any cuts, sores, or bruises on your feet.  You have an injury that is not   healing.  You have redness on your legs or feet.  You feel burning or tingling in your legs or feet.  You have pain or cramps in your legs and feet.  Your legs or feet are numb.  Your feet always feel cold.  You have pain around a toenail. Get help right away if:  You have a wound, scrape, corn, or callus on your foot and: ? You have pain, swelling, or redness that gets worse. ? You have fluid or blood coming from the wound, scrape, corn, or callus. ? Your wound, scrape, corn, or callus feels warm to the touch. ? You have pus or a bad smell coming from the wound, scrape, corn, or callus. ? You have a fever. ? You have a red line going up your leg. Summary  Check your feet every day  for cuts, sores, red spots, swelling, and blisters.  Moisturize feet and legs daily.  Wear shoes that fit properly and have enough cushioning.  If you have foot problems, report any cuts, sores, or bruises to your health care provider immediately.  Schedule a complete foot exam at least once a year (annually) or more often if you have foot problems. This information is not intended to replace advice given to you by your health care provider. Make sure you discuss any questions you have with your health care provider. Document Released: 07/18/2000 Document Revised: 09/02/2017 Document Reviewed: 08/22/2016 Elsevier Patient Education  2020 [employer]   Onychomycosis/Fungal Toenails  WHAT IS IT? An infection that lies within the keratin of your nail plate that is caused by a fungus.  WHY ME? Fungal infections affect all ages, sexes, races, and creeds.  There may be many factors that predispose you to a fungal infection such as age, coexisting medical conditions such as diabetes, or an autoimmune disease; stress, medications, fatigue, genetics, etc.  Bottom line: fungus thrives in a warm, moist environment and your shoes offer such a location.  IS IT CONTAGIOUS? Theoretically, yes.  You do not want to share shoes, nail clippers or files with someone who has fungal toenails.  Walking around barefoot in the same room or sleeping in the same bed is unlikely to transfer the organism.  It is important to realize, however, that fungus can spread easily from one nail to the next on the same foot.  HOW DO WE TREAT THIS?  There are several ways to treat this condition.  Treatment may depend on many factors such as age, medications, pregnancy, liver and kidney conditions, etc.  It is best to ask your doctor which options are available to you.  1. No treatment.   Unlike many other medical concerns, you can live with this condition.  However for many people this can be a painful condition and may lead to  ingrown toenails or a bacterial infection.  It is recommended that you keep the nails cut short to help reduce the amount of fungal nail. 2. Topical treatment.  These range from herbal remedies to prescription strength nail lacquers.  About 40-50% effective, topicals require twice daily application for approximately 9 to 12 months or until an entirely new nail has grown out.  The most effective topicals are medical grade medications available through physicians offices. 3. Oral antifungal medications.  With an 80-90% cure rate, the most common oral medication requires 3 to 4 months of therapy and stays in your system for a year as the new nail grows out.  Oral antifungal medications do require   blood work to make sure it is a safe drug for you.  A liver function panel will be performed prior to starting the medication and after the first month of treatment.  It is important to have the blood work performed to avoid any harmful side effects.  In general, this medication safe but blood work is required. 4. Laser Therapy.  This treatment is performed by applying a specialized laser to the affected nail plate.  This therapy is noninvasive, fast, and non-painful.  It is not covered by insurance and is therefore, out of pocket.  The results have been very good with a 80-95% cure rate.  [facility] is the only practice in the area to offer this therapy. 5. Permanent Nail Avulsion.  Removing the entire nail so that a new nail will not grow back. 

## 2019-02-27 NOTE — Progress Notes (Signed)
Subjective:  [name] presents to clinic today with cc of  painful, thick, discolored, elongated toenails 1-5 b/l that become tender and cannot cut because of thickness.  Pain is aggravated when wearing enclosed shoe gear.  Today, he complains of pain in right great toe lateral border. He denies any redness, drainage or swelling.   Dorothyann Peng, NP is his PCP.   Current Outpatient Medications:  .  acetaminophen (TYLENOL) 325 MG tablet, Take 2 tablets (650 mg total) by mouth every 4 (four) hours as needed for headache or mild pain., Disp: , Rfl:  .  aspirin 81 MG chewable tablet, Chew 81 mg by mouth daily., Disp: , Rfl:  .  atorvastatin (LIPITOR) 20 MG tablet, TAKE 1 TABLET(20 MG) BY MOUTH DAILY, Disp: 90 tablet, Rfl: 1 .  b complex vitamins tablet, Take 1 tablet by mouth daily., Disp: 30 tablet, Rfl: 11 .  Cholecalciferol 2000 units CAPS, Take 1 capsule (2,000 Units total) by mouth daily., Disp: 30 each, Rfl: 11 .  clopidogrel (PLAVIX) 75 MG tablet, TAKE 1 TABLET(75 MG) BY MOUTH DAILY, Disp: 30 tablet, Rfl: 11 .  clopidogrel (PLAVIX) 75 MG tablet, Take 1 tablet (75 mg total) by mouth daily., Disp: 30 tablet, Rfl: 11 .  fenofibrate 54 MG tablet, Take 1 tablet (54 mg total) by mouth daily., Disp: 90 tablet, Rfl: 3 .  FREESTYLE TEST STRIPS test strip, USE TO CHECK BLOOD SUGAR THREE TIMES DAILY, Disp: 300 each, Rfl: 3 .  furosemide (LASIX) 20 MG tablet, TAKE 2 TABLETS BY MOUTH DAILY AS NEEDED FOR FLUID RETENTIONOR SWELLING, Disp: 90 tablet, Rfl: 1 .  Icosapent Ethyl (VASCEPA) 1 g CAPS, TAKE 2 CAPSULES(2 GRAMS) BY MOUTH TWICE DAILY, Disp: 120 capsule, Rfl: 2 .  isosorbide mononitrate (IMDUR) 30 MG 24 hr tablet, TAKE 1 TABLET BY MOUTH DAILY, Disp: 90 tablet, Rfl: 2 .  Lancets (FREESTYLE) lancets, Use as directed three times a day to check blood sugar.  DX E11.9, Disp: 100 each, Rfl: 5 .  lisinopril (PRINIVIL,ZESTRIL) 5 MG tablet, TAKE 1 TABLET(5 MG) BY MOUTH DAILY, Disp: 90 tablet, Rfl: 3 .   metFORMIN (GLUCOPHAGE-XR) 500 MG 24 hr tablet, TAKE 1 TABLET(500 MG) BY MOUTH DAILY WITH SUPPER, Disp: 30 tablet, Rfl: 3 .  metolazone (ZAROXOLYN) 2.5 MG tablet, metolazone 2.5 mg tablet, Disp: , Rfl:  .  metoprolol tartrate (LOPRESSOR) 25 MG tablet, TAKE 1/2 TABLET BY MOUTH TWICE DAILY, Disp: 60 tablet, Rfl: 3 .  nitroGLYCERIN (NITROSTAT) 0.4 MG SL tablet, PLACE 1 TABLET UNDER THE TONGUE EVERY 5 MINUTES AS NEEDED FOR CHEST PAIN, Disp: 25 tablet, Rfl: 3 .  pantoprazole (PROTONIX) 40 MG tablet, TAKE 1 TABLET(40 MG) BY MOUTH DAILY, Disp: 30 tablet, Rfl: 11 .  PIFELTRO 100 MG TABS tablet, TAKE 1 TABLET(100 MG) BY MOUTH DAILY, Disp: 30 tablet, Rfl: 11 .  pioglitazone (ACTOS) 15 MG tablet, TAKE 1 TABLET(15 MG) BY MOUTH DAILY, Disp: 30 tablet, Rfl: 3 .  potassium chloride SA (K-DUR) 20 MEQ tablet, Take 1 tablet (20 mEq total) by mouth 3 (three) times daily., Disp: 270 tablet, Rfl: 0 .  Probiotic Product (PROBIOTIC DAILY) CAPS, Take 1 by mouth daily, Disp: 30 capsule, Rfl: 11 .  repaglinide (PRANDIN) 2 MG tablet, TAKE 2 TABLETS BY MOUTH BEFORE BREAKFAST, 1 TABLET BY MOUTH BEFORE LUNCH AND 2 TABLETS BY MOUTH BEFORE SUPPER, Disp: 450 tablet, Rfl: 3 .  sodium bicarbonate 650 MG tablet, Take 650 mg by mouth 3 (three) times daily., Disp: , Rfl:  .  tamsulosin (FLOMAX) 0.4 MG CAPS capsule, TK 1 C PO BID, Disp: , Rfl: 11 .  TIVICAY 50 MG tablet, TAKE 1 TABLET BY MOUTH DAILY( STOP JULUCA AND PREZCOBIX), Disp: 30 tablet, Rfl: 11 .  triamcinolone cream (KENALOG) 0.1 %, apply twice daily as needed to dry, itchy skin, Disp: , Rfl: 2 .  valACYclovir (VALTREX) 500 MG tablet, TAKE 1 TABLET BY MOUTH DAILY, Disp: 30 tablet, Rfl: 5   No Known Allergies   Objective:  Physical Examination:  Vascular Examination: Capillary refill time <4 seconds x 10 digits.  DP pulses nonpalpable b/l.  PT pulses nonpalpable b/l.  Digital hair absent b/l.  No edema noted b/l.  Skin temperature gradient warm to cool  b/l.  Dermatological Examination: Skin thin, shiny and atrophic b/l.  No open wounds b/l.  No interdigital macerations noted b/l.  Elongated, thick, discolored brittle toenails with subungual debris and pain on dorsal palpation of nailbeds 1-5 b/l. Incurvated nailplate right great toe lateral border with tenderness to palpation. There is nail border hypertrophy. No erythema, no edema, no drainage noted.  Hyperkeratotic lesion submet head 5 left foot with tenderness to palpation. No edema, no erythema, no drainage, no flocculence.  Musculoskeletal Examination: Muscle strength 5/5 to all muscle groups b/l.  No pain, crepitus or joint discomfort with active/passive ROM.  Neurological Examination: Sensation intact 5/5 b/l with 10 gram monofilament.  Vibratory sensation intact b/l.  Proprioceptive sensation intact b/l.  Assessment: Mycotic nail infection with pain 1-5 b/l Ingrown toenail right hallux, noninfected NIDDM with PAD  Plan: Toenails 1-5 b/l were debrided in length and girth without iatrogenic laceration. Offending nail border debrided and curretaged right hallux. Border cleansed with alcohol and tripe antibiotic applied. Patient instructed to apply antibiotic ointment to right great toe once daily for one week. 2.  Calluses pared submetatarsal head 5 left foot utilizing sterile scalpel blade without incident. 3.  Continue soft, supportive shoe gear daily. 4.  Report any pedal injuries to medical professional. 5.  Follow up 3 months. 5.  Patient/POA to call should there be a question/concern in there interim.

## 2019-03-02 ENCOUNTER — Encounter: Payer: Self-pay | Admitting: Internal Medicine

## 2019-03-02 ENCOUNTER — Telehealth: Payer: Self-pay | Admitting: Internal Medicine

## 2019-03-02 ENCOUNTER — Ambulatory Visit: Payer: Medicare Other

## 2019-03-02 NOTE — Telephone Encounter (Signed)
COVID-19 Pre-Screening Questions:  Do you currently have a fever (>100 F), chills or unexplained body aches? N  Are you currently experiencing new cough, shortness of breath, sore throat, runny nose?N   Have you recently travelled outside the state of [place] in the last 14 days? N   Have you been in contact with someone that is currently pending confirmation of Covid19 testing or has been confirmed to have the [place] virus?  N  **If the patient answers NO to ALL questions -  advise the patient to please call the clinic before coming to the office should any symptoms develop.   1.

## 2019-03-03 ENCOUNTER — Ambulatory Visit (INDEPENDENT_AMBULATORY_CARE_PROVIDER_SITE_OTHER): Payer: Medicare Other | Admitting: Internal Medicine

## 2019-03-03 ENCOUNTER — Other Ambulatory Visit: Payer: Self-pay

## 2019-03-03 ENCOUNTER — Encounter: Payer: Self-pay | Admitting: Internal Medicine

## 2019-03-03 DIAGNOSIS — R197 Diarrhea, unspecified: Secondary | ICD-10-CM

## 2019-03-03 DIAGNOSIS — F339 Major depressive disorder, recurrent, unspecified: Secondary | ICD-10-CM | POA: Diagnosis not present

## 2019-03-03 DIAGNOSIS — B20 Human immunodeficiency virus [HIV] disease: Secondary | ICD-10-CM | POA: Diagnosis not present

## 2019-03-03 DIAGNOSIS — I2510 Atherosclerotic heart disease of native coronary artery without angina pectoris: Secondary | ICD-10-CM | POA: Diagnosis not present

## 2019-03-03 NOTE — Assessment & Plan Note (Signed)
His depression is in remission. 

## 2019-03-03 NOTE — Assessment & Plan Note (Signed)
His chronic, intermittent diarrhea has been worse recently.  This may be related to malabsorption and prior pancreatitis.  It is okay for him to continue probiotic and Imodium as needed.

## 2019-03-03 NOTE — Progress Notes (Signed)
Patient Active Problem List   Diagnosis Date Noted   DM (diabetes mellitus), type 2 with ophthalmic complications ([place]) 53/97/6734    Priority: High   Human immunodeficiency virus (HIV) disease ([place]) 05/12/2006    Priority: High   CAD- S/P PCI SVG-OM1 12/01/16 05/12/2006    Priority: High   Impingement syndrome of left shoulder region 07/08/2018   Stiffness of left shoulder joint 07/08/2018   Pain in joint of left shoulder 07/08/2018   Chronic renal insufficiency, stage III (moderate) ([place]) 12/02/2016   NSTEMI (non-ST elevated myocardial infarction) ([place]) 11/29/2016   Neck mass 06/16/2016   Aortic stenosis 09/17/2015   STEMI (ST elevation myocardial infarction) ([place]) 07/29/2015   Lesion of breast 07/21/2015   Carotid bruit 07/10/2015   Metatarsal deformity 01/24/2015   Keratoma 01/24/2015   Pain in joint, ankle and foot 01/19/2015   Absolute anemia 05/28/2014   Pedal edema 05/21/2014   Posterior cervical lymphadenopathy 03/30/2014   Protein-calorie malnutrition, severe ([place]) 03/23/2014   Diarrhea 11/20/2013   HLD (hyperlipidemia) 11/19/2013   Ascites 11/18/2013   Pancreatitis 11/15/2013   Epicondylitis 06/05/2013   Nocturia 03/06/2013   Dermatitis 11/27/2012   Erectile dysfunction 01/01/2012   Lipodystrophy 12/20/2010   Dry eye syndrome 12/20/2010   BELLS PALSY 07/19/2010   GENITAL HERPES 05/03/2009   SHINGLES, HX OF 05/03/2009   WEIGHT LOSS, ABNORMAL 04/03/2009   INGUINAL LYMPHADENOPATHY, RIGHT 04/03/2009   MEMORY LOSS 09/18/2008   PERIPHERAL VASCULAR DISEASE 07/20/2008   COPD 07/20/2008   HIP PAIN, BILATERAL 07/17/2008   KNEE PAIN, BILATERAL 07/17/2008   NEPHROLITHIASIS, HX OF 02/22/2008   Depression, recurrent ([place]) 09/03/2006   GERD 09/03/2006   Hx of CABG 09/03/2006   HEARING LOSS, SENSORINEURAL 05/12/2006   Essential hypertension 05/12/2006   Atherosclerosis of coronary artery 05/12/2006     Patient's Medications  New Prescriptions   No medications on file  Previous Medications   ACETAMINOPHEN (TYLENOL) 325 MG TABLET    Take 2 tablets (650 mg total) by mouth every 4 (four) hours as needed for headache or mild pain.   ASPIRIN 81 MG CHEWABLE TABLET    Chew 81 mg by mouth daily.   ATORVASTATIN (LIPITOR) 20 MG TABLET    TAKE 1 TABLET(20 MG) BY MOUTH DAILY   B COMPLEX VITAMINS TABLET    Take 1 tablet by mouth daily.   CHOLECALCIFEROL 2000 UNITS CAPS    Take 1 capsule (2,000 Units total) by mouth daily.   CLOPIDOGREL (PLAVIX) 75 MG TABLET    TAKE 1 TABLET(75 MG) BY MOUTH DAILY   CLOPIDOGREL (PLAVIX) 75 MG TABLET    Take 1 tablet (75 mg total) by mouth daily.   FENOFIBRATE 54 MG TABLET    Take 1 tablet (54 mg total) by mouth daily.   FREESTYLE TEST STRIPS TEST STRIP    USE TO CHECK BLOOD SUGAR THREE TIMES DAILY   FUROSEMIDE (LASIX) 20 MG TABLET    TAKE 2 TABLETS BY MOUTH DAILY AS NEEDED FOR FLUID RETENTIONOR SWELLING   ICOSAPENT ETHYL (VASCEPA) 1 G CAPS    TAKE 2 CAPSULES(2 GRAMS) BY MOUTH TWICE DAILY   ISOSORBIDE MONONITRATE (IMDUR) 30 MG 24 HR TABLET    TAKE 1 TABLET BY MOUTH DAILY   LANCETS (FREESTYLE) LANCETS    Use as directed three times a day to check blood sugar.  DX E11.9   LISINOPRIL (PRINIVIL,ZESTRIL) 5 MG TABLET    TAKE 1 TABLET(5 MG) BY MOUTH DAILY  METFORMIN (GLUCOPHAGE-XR) 500 MG 24 HR TABLET    TAKE 1 TABLET(500 MG) BY MOUTH DAILY WITH SUPPER   METOLAZONE (ZAROXOLYN) 2.5 MG TABLET    metolazone 2.5 mg tablet   METOPROLOL TARTRATE (LOPRESSOR) 25 MG TABLET    TAKE 1/2 TABLET BY MOUTH TWICE DAILY   NITROGLYCERIN (NITROSTAT) 0.4 MG SL TABLET    PLACE 1 TABLET UNDER THE TONGUE EVERY 5 MINUTES AS NEEDED FOR CHEST PAIN   PANTOPRAZOLE (PROTONIX) 40 MG TABLET    TAKE 1 TABLET(40 MG) BY MOUTH DAILY   PIFELTRO 100 MG TABS TABLET    TAKE 1 TABLET(100 MG) BY MOUTH DAILY   PIOGLITAZONE (ACTOS) 15 MG TABLET    TAKE 1 TABLET(15 MG) BY MOUTH DAILY   POTASSIUM CHLORIDE SA (K-DUR)  20 MEQ TABLET    Take 1 tablet (20 mEq total) by mouth 3 (three) times daily.   PROBIOTIC PRODUCT (PROBIOTIC DAILY) CAPS    Take 1 by mouth daily   REPAGLINIDE (PRANDIN) 2 MG TABLET    TAKE 2 TABLETS BY MOUTH BEFORE BREAKFAST, 1 TABLET BY MOUTH BEFORE LUNCH AND 2 TABLETS BY MOUTH BEFORE SUPPER   SODIUM BICARBONATE 650 MG TABLET    Take 650 mg by mouth 3 (three) times daily.   TAMSULOSIN (FLOMAX) 0.4 MG CAPS CAPSULE    TK 1 C PO BID   TIVICAY 50 MG TABLET    TAKE 1 TABLET BY MOUTH DAILY( STOP JULUCA AND PREZCOBIX)   TRIAMCINOLONE CREAM (KENALOG) 0.1 %    apply twice daily as needed to dry, itchy skin   VALACYCLOVIR (VALTREX) 500 MG TABLET    TAKE 1 TABLET BY MOUTH DAILY  Modified Medications   No medications on file  Discontinued Medications   No medications on file    Subjective: [name] is in for his routine HIV follow-up visit.  He has had no problems obtaining, taking or tolerating his Tivicay for Pifeltro.  He does not recall missing any doses.  He is not on any new medications.  He has been feeling well except for some intermittent diarrhea over the past few months.  Will have some occasional mild cramps.  He started taking his probiotic again recently and feels like that is helping.  He also uses Imodium as needed.  He has been socially distancing during the [name] pandemic.  He has not been spending as much time with his granddaughter as he would like.  He is still active with his food bank at church.  Stress lately because he has been dealing with his "ex" more than he would like to.  Review of Systems: Review of Systems  Constitutional: Negative for chills, diaphoresis, fever, malaise/fatigue and weight loss.  HENT: Negative for sore throat.   Respiratory: Negative for cough, sputum production and shortness of breath.   Cardiovascular: Negative for chest pain.  Gastrointestinal: Positive for abdominal pain and diarrhea. Negative for heartburn, nausea and vomiting.  Genitourinary:  Negative for dysuria and frequency.  Musculoskeletal: Negative for joint pain and myalgias.  Skin: Negative for rash.  Neurological: Negative for dizziness and headaches.  Psychiatric/Behavioral: Negative for depression and substance abuse. The patient is not nervous/anxious.     Past Medical History:  Diagnosis Date   Absolute anemia 05/28/2014   Aortic stenosis 09/17/2015   Arthritis    Ascites 11/18/2013   BELLS PALSY 07/19/2010   Qualifier: Diagnosis of  By: [name], [name]     CAD (coronary artery disease)    a. s/p CABG  in 2003 b. s/p PCI to SVG-PDA in 07/2015 c. 11/2016: cath showing severe native CAD with patent LIMA-LAD and SVG-D1 with 80% stenosis of SVG-OM1-OM2. Initially medical management was recommended --> presented with recurrent angina --> s/p Synergy DES to proximal body of SVG-OM1-OM2, POBA to distal graft.    CAD- S/P PCI SVG-OM1 12/01/16 05/12/2006   Qualifier: Diagnosis of  By: [name], Deigo Alonso     Carotid bruit 07/10/2015   Chest pain 11/22/2016   Chronic kidney disease    Chronic renal insufficiency, stage III (moderate) ([place]) 12/02/2016   Constipation 05/28/2014   COPD 07/20/2008   Qualifier: Diagnosis of  By: [name], [name]    DEPRESSION 09/03/2006   Qualifier: Diagnosis of  By: [name], Lillieanna Tuohy     Dermatitis 11/27/2012   Diabetes mellitus    Diarrhea 11/20/2013   DM (diabetes mellitus), type 2 with ophthalmic complications ([place]) 2/44/0102   Qualifier: Diagnosis of  By: [name], Shariq Puig     Dry eye syndrome 12/20/2010   Epicondylitis 06/05/2013   right   Erectile dysfunction 01/01/2012   Essential hypertension 05/12/2006   Qualifier: Diagnosis of  By: [name], Makana Feigel     GENITAL HERPES 05/03/2009   Qualifier: Diagnosis of  By: [name], Kimoni Pagliarulo     GERD 09/03/2006   Qualifier: Diagnosis of  By: [name], Shin Lamour     GERD (gastroesophageal reflux disease)    HEARING LOSS, SENSORINEURAL 05/12/2006   Qualifier: Diagnosis of  By:  [name], Yosgar Demirjian     HEMATOCHEZIA 04/18/2008   Annotation: 9/09 during bout of constipation Qualifier: Diagnosis of  By: [name], Dazha Kempa     HIP PAIN, BILATERAL 07/17/2008   Qualifier: Diagnosis of  By: [name], [name]    History of depression    History of kidney stones    HIV (human immunodeficiency virus infection) ([place]) 1991   on meds since initial dx.    HLD (hyperlipidemia) 11/19/2013   Human immunodeficiency virus (HIV) disease ([place]) 05/12/2006   Qualifier: Diagnosis of  By: [name], Tyvon Eggenberger     Hx of CABG 09/03/2006   Annotation: 2003 Qualifier: Diagnosis of  By: [name], Alp Goldwater     Hyperkalemia 03/23/2014   Hyperlipidemia    Hypertension    Hyponatremia 03/23/2014   INGUINAL LYMPHADENOPATHY, RIGHT 04/03/2009   Qualifier: Diagnosis of  By: [name], [name] 01/24/2015   KNEE PAIN, BILATERAL 07/17/2008   Qualifier: Diagnosis of  By: [name], [name]    Lesion of breast 07/21/2015   Lipodystrophy 12/20/2010   Memory loss 09/18/2008   Qualifier: Diagnosis of  By: [name], [name]    Metatarsal deformity 01/24/2015   Nasal abscess 12/04/2015   Night sweats 07/06/2012   Nocturia 03/06/2013   NSTEMI (non-ST elevated myocardial infarction) ([place]) 11/29/2016   Pain in joint, ankle and foot 01/19/2015   Pancreatitis 11/2013   attributed to HIV meds.    Pedal edema 05/21/2014   PERIPHERAL VASCULAR DISEASE 07/20/2008   Qualifier: Diagnosis of  By: [name], [name]    Posterior cervical lymphadenopathy 03/30/2014   Protein-calorie malnutrition, severe ([place]) 03/23/2014   SHINGLES, HX OF 05/03/2009   Annotation: R leg Qualifier: Diagnosis of  By: [name], Javarian Jakubiak     STEMI (ST elevation myocardial infarction) ([place]) 07/29/2015   Unstable angina ([place]) 11/24/2016   WEIGHT LOSS, ABNORMAL 04/03/2009   Qualifier: Diagnosis of  By: [name], Jenny Reichmann  Social History   Tobacco Use   Smoking status: Never Smoker   Smokeless tobacco: Never Used   Substance Use Topics   Alcohol use: Yes    Alcohol/week: 0.0 standard drinks    Comment: rare   Drug use: No    Family History  Problem Relation Age of Onset   Hyperlipidemia Mother    Diabetes Mother        paternal grandparents/1 brother   Hyperlipidemia Father    Hypertension Father        paternal grandmother/3 brothers/1 sister   Arthritis Unknown        mother/father/paternal grandparents   Breast cancer Maternal Aunt        paternal aunt   Lung cancer Maternal Aunt    Heart disease Unknown        parents/maternal grandparents/ 2 brothers   Stroke Paternal Grandmother    Mental retardation Sister    Colon cancer Neg Hx    Esophageal cancer Neg Hx    Pancreatic cancer Neg Hx    Stomach cancer Neg Hx    Liver disease Neg Hx     No Known Allergies  Health Maintenance  Topic Date Due   COLONOSCOPY  07/21/2013   OPHTHALMOLOGY EXAM  05/04/2017   FOOT EXAM  12/24/2018   INFLUENZA VACCINE  03/05/2019   HEMOGLOBIN A1C  04/22/2019   TETANUS/TDAP  12/03/2025   Hepatitis C Screening  Completed   PNA vac Low Risk Adult  Completed    Objective:  Vitals:   03/03/19 0953  BP: 129/68  Pulse: 74  Temp: 98 F (36.7 C)   There is no height or weight on file to calculate BMI.  Physical Exam Constitutional:      Comments: He is calm and pleasant as usual.  Cardiovascular:     Rate and Rhythm: Normal rate and regular rhythm.     Heart sounds: No murmur.     Comments: His heart sounds are distant.  He has a healed sternotomy. Pulmonary:     Effort: Pulmonary effort is normal.     Breath sounds: Normal breath sounds.  Abdominal:     Palpations: Abdomen is soft.     Tenderness: There is no abdominal tenderness.  Skin:    Findings: No rash.  Psychiatric:        Mood and Affect: Mood normal.     Lab Results Lab Results  Component Value Date   WBC 5.1 02/17/2019   HGB 12.1 (L) 02/17/2019   HCT 36.6 (L) 02/17/2019   MCV 95.8  02/17/2019   PLT 183 02/17/2019    Lab Results  Component Value Date   CREATININE 1.83 (H) 02/17/2019   BUN 22 02/17/2019   NA 141 02/17/2019   K 5.2 02/17/2019   CL 109 02/17/2019   CO2 25 02/17/2019    Lab Results  Component Value Date   ALT 25 02/17/2019   AST 19 02/17/2019   ALKPHOS 40 10/20/2018   BILITOT 0.4 02/17/2019    Lab Results  Component Value Date   CHOL 109 02/22/2018   HDL 28.70 (L) 02/22/2018   LDLCALC 60 02/22/2018   LDLDIRECT 72.0 11/04/2016   TRIG 103.0 02/22/2018   CHOLHDL 4 02/22/2018   Lab Results  Component Value Date   LABRPR NON-REACTIVE 01/07/2018   HIV 1 RNA Quant (copies/mL)  Date Value  02/17/2019 <20 DETECTED (A)  07/23/2018 <20 NOT DETECTED  01/07/2018 <20 NOT DETECTED   HIV-1 RNA Viral Load (  no units)  Date Value  07/14/2013 <40  04/13/2013 <40  01/18/2013 <40   CD4 (no units)  Date Value  07/14/2013 514  04/13/2013 498  01/18/2013 522   CD4 T Cell Abs (/uL)  Date Value  02/17/2019 358 (L)  07/23/2018 300 (L)  01/07/2018 320 (L)     Problem List Items Addressed This Visit      High   Human immunodeficiency virus (HIV) disease ([place])    His infection remains under excellent, long control.  He will continue his current antiretroviral regimen and follow-up after blood work in 6 months.      Relevant Orders   T-helper cell (CD4)- ([facility] clinic only)   HIV-1 RNA quant-no reflex-bld   CBC   Comprehensive metabolic panel   RPR     Unprioritized   Diarrhea    His chronic, intermittent diarrhea has been worse recently.  This may be related to malabsorption and prior pancreatitis.  It is okay for him to continue probiotic and Imodium as needed.      Depression, recurrent ([place])    His depression is in remission.           Michel Bickers, MD [facility] [place] [facility] [phone] pager   [phone] cell 03/03/2019, 10:28 AM

## 2019-03-03 NOTE — Assessment & Plan Note (Signed)
His infection remains under excellent, long control.  He will continue his current antiretroviral regimen and follow-up after blood work in 6 months.

## 2019-03-08 ENCOUNTER — Other Ambulatory Visit: Payer: Self-pay

## 2019-03-08 DIAGNOSIS — Z20822 Contact with and (suspected) exposure to covid-19: Secondary | ICD-10-CM

## 2019-03-08 DIAGNOSIS — R6889 Other general symptoms and signs: Secondary | ICD-10-CM | POA: Diagnosis not present

## 2019-03-09 ENCOUNTER — Encounter: Payer: Self-pay | Admitting: Adult Health

## 2019-03-09 LAB — NOVEL CORONAVIRUS, NAA: SARS-CoV-2, NAA: NOT DETECTED

## 2019-03-10 ENCOUNTER — Other Ambulatory Visit: Payer: Self-pay | Admitting: Cardiology

## 2019-03-16 ENCOUNTER — Encounter: Payer: Self-pay | Admitting: *Deleted

## 2019-03-23 ENCOUNTER — Other Ambulatory Visit: Payer: Self-pay | Admitting: Endocrinology

## 2019-04-07 ENCOUNTER — Other Ambulatory Visit: Payer: Self-pay | Admitting: Internal Medicine

## 2019-04-07 DIAGNOSIS — B029 Zoster without complications: Secondary | ICD-10-CM

## 2019-04-13 ENCOUNTER — Ambulatory Visit (HOSPITAL_COMMUNITY)
Admission: EM | Admit: 2019-04-13 | Discharge: 2019-04-13 | Disposition: A | Discharge status: Home / Self Care | Payer: Medicare Other | Attending: Family Medicine | Admitting: Family Medicine

## 2019-04-13 ENCOUNTER — Encounter (HOSPITAL_COMMUNITY): Payer: Self-pay

## 2019-04-13 ENCOUNTER — Other Ambulatory Visit: Payer: Self-pay

## 2019-04-13 DIAGNOSIS — S61233A Puncture wound without foreign body of left middle finger without damage to nail, initial encounter: Secondary | ICD-10-CM

## 2019-04-13 DIAGNOSIS — Z23 Encounter for immunization: Secondary | ICD-10-CM

## 2019-04-13 DIAGNOSIS — S61239A Puncture wound without foreign body of unspecified finger without damage to nail, initial encounter: Secondary | ICD-10-CM

## 2019-04-13 DIAGNOSIS — W271XXA Contact with garden tool, initial encounter: Secondary | ICD-10-CM

## 2019-04-13 MED ORDER — DOXYCYCLINE HYCLATE 100 MG PO CAPS: 100.0000 mg | ORAL_CAPSULE | Freq: Two times a day (BID) | ORAL | 0 refills | Status: DC

## 2019-04-13 MED ORDER — TETANUS-DIPHTH-ACELL PERTUSSIS 5-2.5-18.5 LF-MCG/0.5 IM SUSP
0.5000 mL | Freq: Once | INTRAMUSCULAR | Status: AC
  Administered 2019-04-13: 11:00:00 0.5 mL via INTRAMUSCULAR

## 2019-04-13 MED ORDER — TETANUS-DIPHTH-ACELL PERTUSSIS 5-2.5-18.5 LF-MCG/0.5 IM SUSP
INTRAMUSCULAR | Status: AC
  Filled 2019-04-13: qty 0.5

## 2019-04-13 NOTE — ED Provider Notes (Signed)
Mark   [id number] 04/13/19 Arrival Time: [id number]  ASSESSMENT & PLAN:  1. Puncture wound of finger of left hand, initial encounter     Concern for cellulitis. No obvious felon/abscess formation. To begin: Meds ordered this encounter  Medications  . doxycycline (VIBRAMYCIN) 100 MG capsule    Sig: Take 1 capsule (100 mg total) by mouth 2 (two) times daily.    Dispense:  20 capsule    Refill:  0  . Tdap (BOOSTRIX) injection 0.5 mL   Close observation. OTC analgesics as needed. May f/u here with any concerns.  Reviewed expectations re: course of current medical issues. Questions answered. Outlined signs and symptoms indicating need for more acute intervention. Patient verbalized understanding. After Visit Summary given.   SUBJECTIVE:  [name] is a 73 y.o. male who presents with a puncture wound to the pad of his distal left third finger. From dirty garden sheers. Minimal bleeding. Washed at home. Is painful. Today noticed more swelling and erythema surrounding puncture. Afebrile. No specific aggravating or alleviating factors reported. No body aches. No extremity sensation changes or weakness. No OTC analgesics required/taken.  Td UTD: No, 2007..  ROS: As per HPI. All other systems negative.    OBJECTIVE:  Vitals:   04/13/19 0942 04/13/19 0946  BP: 127/70   Pulse: 70   Resp: 16   Temp: 98.1 F (36.7 C)   TempSrc: Temporal   SpO2: 100%   Weight:  64.4 kg     General appearance: alert; no distress LUE: small closed puncture wound of pad of third distal finger; moderate overlying/surrounding erythema that is warm and tender to touch; without active bleeding or drainage; no felon; third finger with FROM, normal capillary refill, normal distal sensation Psychological: alert and cooperative; normal mood and affect   No Known Allergies  Past Medical History:  Diagnosis Date  . Absolute anemia 05/28/2014  . Aortic stenosis 09/17/2015  . Arthritis    . Ascites 11/18/2013  . BELLS PALSY 07/19/2010   Qualifier: Diagnosis of  By: [name], [name]    . CAD (coronary artery disease)    a. s/p CABG in 2003 b. s/p PCI to SVG-PDA in 07/2015 c. 11/2016: cath showing severe native CAD with patent LIMA-LAD and SVG-D1 with 80% stenosis of SVG-OM1-OM2. Initially medical management was recommended --> presented with recurrent angina --> s/p Synergy DES to proximal body of SVG-OM1-OM2, POBA to distal graft.   Marland Kitchen CAD- S/P PCI SVG-OM1 12/01/16 05/12/2006   Qualifier: Diagnosis of  By: [name], [name]    . Carotid bruit 07/10/2015  . Chest pain 11/22/2016  . Chronic kidney disease   . Chronic renal insufficiency, stage III (moderate) ([place]) 12/02/2016  . Constipation 05/28/2014  . COPD 07/20/2008   Qualifier: Diagnosis of  By: [name], [name]   . DEPRESSION 09/03/2006   Qualifier: Diagnosis of  By: [name], [name]    . Dermatitis 11/27/2012  . Diabetes mellitus   . Diarrhea 11/20/2013  . DM (diabetes mellitus), type 2 with ophthalmic complications ([place]) 0000000   Qualifier: Diagnosis of  By: [name], [name]    . Dry eye syndrome 12/20/2010  . Epicondylitis 06/05/2013   right  . Erectile dysfunction 01/01/2012  . Essential hypertension 05/12/2006   Qualifier: Diagnosis of  By: [name], [name]    . GENITAL HERPES 05/03/2009   Qualifier: Diagnosis of  By: [name], [name]    . GERD 09/03/2006   Qualifier: Diagnosis of  By: [name], [name]    . GERD (gastroesophageal reflux disease)   . HEARING LOSS, SENSORINEURAL 05/12/2006   Qualifier: Diagnosis of  By: [name], [name]    . HEMATOCHEZIA 04/18/2008   Annotation: 9/09 during bout of constipation Qualifier: Diagnosis of  By: [name], [name]    . HIP PAIN, BILATERAL 07/17/2008   Qualifier: Diagnosis of  By: [name], [name]   . History of depression   . History of kidney stones   . HIV (human immunodeficiency virus infection) ([place]) 1991   on meds since initial dx.   Marland Kitchen HLD (hyperlipidemia)  11/19/2013  . Human immunodeficiency virus (HIV) disease ([place]) 05/12/2006   Qualifier: Diagnosis of  By: [name], [name]    . Hx of CABG 09/03/2006   Annotation: 2003 Qualifier: Diagnosis of  By: [name], [name]    . Hyperkalemia 03/23/2014  . Hyperlipidemia   . Hypertension   . Hyponatremia 03/23/2014  . INGUINAL LYMPHADENOPATHY, RIGHT 04/03/2009   Qualifier: Diagnosis of  By: [name], [name]    . Keratoma 01/24/2015  . KNEE PAIN, BILATERAL 07/17/2008   Qualifier: Diagnosis of  By: [name], [name]   . Lesion of breast 07/21/2015  . Lipodystrophy 12/20/2010  . Memory loss 09/18/2008   Qualifier: Diagnosis of  By: [name], [name]   . Metatarsal deformity 01/24/2015  . Nasal abscess 12/04/2015  . Night sweats 07/06/2012  . Nocturia 03/06/2013  . NSTEMI (non-ST elevated myocardial infarction) ([place]) 11/29/2016  . Pain in joint, ankle and foot 01/19/2015  . Pancreatitis 11/2013   attributed to HIV meds.   . Pedal edema 05/21/2014  . PERIPHERAL VASCULAR DISEASE 07/20/2008   Qualifier: Diagnosis of  By: [name], [name]   . Posterior cervical lymphadenopathy 03/30/2014  . Protein-calorie malnutrition, severe ([place]) 03/23/2014  . SHINGLES, HX OF 05/03/2009   Annotation: R leg Qualifier: Diagnosis of  By: [name], [name]    . STEMI (ST elevation myocardial infarction) ([place]) 07/29/2015  . Unstable angina ([place]) 11/24/2016  . WEIGHT LOSS, ABNORMAL 04/03/2009   Qualifier: Diagnosis of  By: [name], [name]     Social History   Socioeconomic History  . Marital status: Divorced    Spouse name: Not on file  . Number of children: 4  . Years of education: Not on file  . Highest education level: Not on file  Occupational History  . 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   Social Needs  . Financial resource strain: Not on file  . Food insecurity    Worry: Not on file    Inability: Not on file  . Transportation needs    Medical: Not on  file    Non-medical: Not on file  Tobacco Use  . Smoking status: Never Smoker  . Smokeless tobacco: Never Used  Substance and Sexual Activity  . Alcohol use: Yes    Alcohol/week: 0.0 standard drinks    Comment: rare  . Drug use: No  . Sexual activity: Not Currently    Comment: declined condoms  Lifestyle  . Physical activity    Days per week: Not on file    Minutes per session: Not on file  . Stress: Not on file  Relationships  . Social Herbalist on phone: Not on file    Gets together: Not on file    Attends religious service: Not on file  Active member of club or organization: Not on file    Attends meetings of clubs or organizations: Not on file    Relationship status: Not on file  Other Topics Concern  . Not on file  Social History Narrative   Lives alone.  Supportive friends and family.  His HIV Dx is not a secret.          Vanessa Kick, MD 04/13/19 1032

## 2019-04-13 NOTE — ED Triage Notes (Signed)
Pt states he punctured his middle finger on his left hand while he was using his shears in his garden yesterday.

## 2019-04-24 ENCOUNTER — Other Ambulatory Visit: Payer: Self-pay | Admitting: Endocrinology

## 2019-04-24 DIAGNOSIS — E1165 Type 2 diabetes mellitus with hyperglycemia: Secondary | ICD-10-CM

## 2019-04-24 DIAGNOSIS — E782 Mixed hyperlipidemia: Secondary | ICD-10-CM

## 2019-04-26 ENCOUNTER — Other Ambulatory Visit (INDEPENDENT_AMBULATORY_CARE_PROVIDER_SITE_OTHER): Payer: Medicare Other

## 2019-04-26 ENCOUNTER — Other Ambulatory Visit: Payer: Self-pay

## 2019-04-26 DIAGNOSIS — E1165 Type 2 diabetes mellitus with hyperglycemia: Secondary | ICD-10-CM

## 2019-04-26 DIAGNOSIS — E782 Mixed hyperlipidemia: Secondary | ICD-10-CM | POA: Diagnosis not present

## 2019-04-26 LAB — COMPREHENSIVE METABOLIC PANEL
ALT: 29 U/L (ref 0–53)
AST: 27 U/L (ref 0–37)
Albumin: 3.8 g/dL (ref 3.5–5.2)
Alkaline Phosphatase: 38 U/L — ABNORMAL LOW (ref 39–117)
BUN: 28 mg/dL — ABNORMAL HIGH (ref 6–23)
CO2: 26 mEq/L (ref 19–32)
Calcium: 9.3 mg/dL (ref 8.4–10.5)
Chloride: 107 mEq/L (ref 96–112)
Creatinine, Ser: 1.67 mg/dL — ABNORMAL HIGH (ref 0.40–1.50)
GFR: 40.77 mL/min — ABNORMAL LOW (ref >60.00)
Glucose, Bld: 98 mg/dL (ref 70–99)
Potassium: 3.9 mEq/L (ref 3.5–5.1)
Sodium: 141 mEq/L (ref 135–145)
Total Bilirubin: 0.5 mg/dL (ref 0.2–1.2)
Total Protein: 6.5 g/dL (ref 6.0–8.3)

## 2019-04-26 LAB — MICROALBUMIN / CREATININE URINE RATIO
Creatinine,U: 104.1 mg/dL
Microalb Creat Ratio: 5.2 mg/g (ref 0.0–30.0)
Microalb, Ur: 5.4 mg/dL — ABNORMAL HIGH (ref 0.0–1.9)

## 2019-04-26 LAB — URINALYSIS, ROUTINE W REFLEX MICROSCOPIC
Bilirubin Urine: NEGATIVE
Hgb urine dipstick: NEGATIVE
Ketones, ur: NEGATIVE
Leukocytes,Ua: NEGATIVE
Nitrite: NEGATIVE
RBC / HPF: NONE SEEN (ref 0–?)
Specific Gravity, Urine: 1.02 (ref 1.000–1.030)
Urine Glucose: NEGATIVE
Urobilinogen, UA: 0.2 (ref 0.0–1.0)
pH: 6 (ref 5.0–8.0)

## 2019-04-26 LAB — LIPID PANEL
Cholesterol: 99 mg/dL (ref 0–200)
HDL: 27.9 mg/dL — ABNORMAL LOW (ref >39.00)
LDL Cholesterol: 58 mg/dL (ref 0–99)
NonHDL: 70.92
Total CHOL/HDL Ratio: 4
Triglycerides: 67 mg/dL (ref 0.0–149.0)
VLDL: 13.4 mg/dL (ref 0.0–40.0)

## 2019-04-26 LAB — HEMOGLOBIN A1C: Hgb A1c MFr Bld: 7.2 % — ABNORMAL HIGH (ref 4.6–6.5)

## 2019-04-28 ENCOUNTER — Other Ambulatory Visit: Payer: Self-pay

## 2019-04-29 ENCOUNTER — Other Ambulatory Visit: Payer: Self-pay

## 2019-04-29 ENCOUNTER — Ambulatory Visit (INDEPENDENT_AMBULATORY_CARE_PROVIDER_SITE_OTHER): Payer: Medicare Other | Admitting: Endocrinology

## 2019-04-29 ENCOUNTER — Encounter: Payer: Self-pay | Admitting: Endocrinology

## 2019-04-29 DIAGNOSIS — N289 Disorder of kidney and ureter, unspecified: Secondary | ICD-10-CM | POA: Diagnosis not present

## 2019-04-29 DIAGNOSIS — E782 Mixed hyperlipidemia: Secondary | ICD-10-CM

## 2019-04-29 DIAGNOSIS — E1165 Type 2 diabetes mellitus with hyperglycemia: Secondary | ICD-10-CM | POA: Diagnosis not present

## 2019-04-29 NOTE — Progress Notes (Signed)
Patient ID: [name], male   DOB: 13-May-1946, 73 y.o.   MRN: [id number]           Reason for Appointment: Follow-up for Type 2 Diabetes  Today's office visit was provided via telemedicine using a telephone call to the patient Patient has been explained the limitations of evaluation and management by telemedicine and the availability of in person appointments.  The patient understood the limitations and agreed to proceed. Patient also understood that the telehealth visit is billable. . Location of the patient: Home . Location of the provider: Office Only the patient and myself were participating in the encounter  History of Present Illness:          Date of diagnosis of type 2 diabetes mellitus: 2013        Background history:  He only mildly increased blood sugar levels at the time of diagnosis and A1c was 7.1  He had been treated initially with metformin but this was stopped subsequently when his renal function was worse  He was subsequently treated with glipizide but his blood sugars had been poorly controlled in 2016 with glipizide alone Since his A1c has been persistently over 9% he was started on Januvia 50 mg in 01/2015 At initial consultation he had a high A1c of 9.5; he was switched from glipizide to Prandin in 02/2015 Previously when he had high fasting readings he was started on Toujeo on 06/11/16, however this was stopped in April 2018 when he was getting hypoglycemia  Recent history:    Oral hypoglycemic drugs: Prandin 4 mg at breakfast, 2 mg at dinner and 2 mg before lunch , Actos 15 mg daily, metformin ER 500 mg daily   His A1c is usually just over 7% and now 7.2, previous range 6.3-7.6  Fructosamine last was 253, previously had been 245  Current blood sugar patterns and problems identified:  He has checked some blood sugars sporadically but overall in the morning before breakfast  Most of his blood sugars are excellent  Also lab glucose was 98 fasting in the  morning  Although his appetite is fairly good he thinks his weight is about 140 now was weighing 142 earlier this month  He now says that if he is more active working he may feel the symptoms of low sugars with shakiness, this is usually happening in the afternoon  Does not do to check sugars when he feels shaky but eats something  He is not doing any formal walking for exercise  Most of the time is able to remember to take his Prandin before eating  No ankle edema with Actos   Side effects from medications have been: None   Glucose monitoring:  done usually 0-1  times a day         Glucometer:  FreeStyle     Blood Glucose readings  Range from 70-135, average 102 but all in the morning  Sugar readings at home recently 88-166, overall no readings over 200  Dietician visit, most recent: 8/15 DIET:  has been trying to reduce fried/usually not eating high fat  foods; eating oatmeal in the morning  greek yogurt               Dinner usually at 6 pm .  Weight history:   Wt Readings from Last 3 Encounters:  04/13/19 142 lb (64.4 kg)  10/27/18 146 lb 6.4 oz (66.4 kg)  09/22/18 149 lb (67.6 kg)    Glycemic control:  Lab Results  Component Value Date   HGBA1C 7.2 (H) 04/26/2019   HGBA1C 7.3 (H) 10/20/2018   HGBA1C 7.1 (H) 06/04/2018   Lab Results  Component Value Date   MICROALBUR 5.4 (H) 04/26/2019   LDLCALC 58 04/26/2019   CREATININE 1.67 (H) 04/26/2019    Other active problems: See review of systems   Lab on 04/26/2019  Component Date Value Ref Range Status  . Cholesterol 04/26/2019 99  0 - 200 mg/dL Final   ATP III Classification       Desirable:  < 200 mg/dL               Borderline High:  200 - 239 mg/dL          High:  > = 240 mg/dL  . Triglycerides 04/26/2019 67.0  0.0 - 149.0 mg/dL Final   Normal:  <150 mg/dLBorderline High:  150 - 199 mg/dL  . HDL 04/26/2019 27.90* >39.00 mg/dL Final  . VLDL 04/26/2019 13.4  0.0 - 40.0 mg/dL Final  . LDL Cholesterol  04/26/2019 58  0 - 99 mg/dL Final  . Total CHOL/HDL Ratio 04/26/2019 4   Final                  Men          Women1/2 Average Risk     3.4          3.3Average Risk          5.0          4.42X Average Risk          9.6          7.13X Average Risk          15.0          11.0                      . NonHDL 04/26/2019 70.92   Final   NOTE:  Non-HDL goal should be 30 mg/dL higher than patient's LDL goal (i.e. LDL goal of < 70 mg/dL, would have non-HDL goal of < 100 mg/dL)  . Color, Urine 04/26/2019 YELLOW  Yellow;Lt. Yellow;Straw;Dark Yellow;Amber;Green;Red;Brown Final  . APPearance 04/26/2019 CLEAR  Clear;Turbid;Slightly Cloudy;Cloudy Final  . Specific Gravity, Urine 04/26/2019 1.020  1.000 - 1.030 Final  . pH 04/26/2019 6.0  5.0 - 8.0 Final  . Total Protein, Urine 04/26/2019 TRACE* Negative Final  . Urine Glucose 04/26/2019 NEGATIVE  Negative Final  . Ketones, ur 04/26/2019 NEGATIVE  Negative Final  . Bilirubin Urine 04/26/2019 NEGATIVE  Negative Final  . Hgb urine dipstick 04/26/2019 NEGATIVE  Negative Final  . Urobilinogen, UA 04/26/2019 0.2  0.0 - 1.0 Final  . Leukocytes,Ua 04/26/2019 NEGATIVE  Negative Final  . Nitrite 04/26/2019 NEGATIVE  Negative Final  . WBC, UA 04/26/2019 0-2/hpf  0-2/hpf Final  . RBC / HPF 04/26/2019 none seen  0-2/hpf Final  . Mucus, UA 04/26/2019 Presence of* None Final  . Squamous Epithelial / LPF 04/26/2019 Rare(0-4/hpf)  Rare(0-4/hpf) Final  . Microalb, Ur 04/26/2019 5.4* 0.0 - 1.9 mg/dL Final  . Creatinine,U 04/26/2019 104.1  mg/dL Final  . Microalb Creat Ratio 04/26/2019 5.2  0.0 - 30.0 mg/g Final  . Sodium 04/26/2019 141  135 - 145 mEq/L Final  . Potassium 04/26/2019 3.9  3.5 - 5.1 mEq/L Final  . Chloride 04/26/2019 107  96 - 112 mEq/L Final  . CO2 04/26/2019 26  19 - 32 mEq/L Final  .  Glucose, Bld 04/26/2019 98  70 - 99 mg/dL Final  . BUN 04/26/2019 28* 6 - 23 mg/dL Final  . Creatinine, Ser 04/26/2019 1.67* 0.40 - 1.50 mg/dL Final  . Total Bilirubin  04/26/2019 0.5  0.2 - 1.2 mg/dL Final  . Alkaline Phosphatase 04/26/2019 38* 39 - 117 U/L Final  . AST 04/26/2019 27  0 - 37 U/L Final  . ALT 04/26/2019 29  0 - 53 U/L Final  . Total Protein 04/26/2019 6.5  6.0 - 8.3 g/dL Final  . Albumin 04/26/2019 3.8  3.5 - 5.2 g/dL Final  . Calcium 04/26/2019 9.3  8.4 - 10.5 mg/dL Final  . GFR 04/26/2019 40.77* >60.00 mL/min Final  . Hgb A1c MFr Bld 04/26/2019 7.2* 4.6 - 6.5 % Final   Glycemic Control Guidelines for People with Diabetes:Non Diabetic:  <6%Goal of Therapy: <7%Additional Action Suggested:  >8%        Allergies as of 04/29/2019   No Known Allergies     Medication List       Accurate as of April 29, 2019 10:49 AM. If you have any questions, ask your nurse or doctor.        acetaminophen 325 MG tablet Commonly known as: TYLENOL Take 2 tablets (650 mg total) by mouth every 4 (four) hours as needed for headache or mild pain.   aspirin 81 MG chewable tablet Chew 81 mg by mouth daily.   atorvastatin 20 MG tablet Commonly known as: LIPITOR TAKE 1 TABLET(20 MG) BY MOUTH DAILY   b complex vitamins tablet Take 1 tablet by mouth daily.   Cholecalciferol 50 MCG (2000 UT) Caps Take 1 capsule (2,000 Units total) by mouth daily.   clopidogrel 75 MG tablet Commonly known as: PLAVIX TAKE 1 TABLET(75 MG) BY MOUTH DAILY   clopidogrel 75 MG tablet Commonly known as: PLAVIX Take 1 tablet (75 mg total) by mouth daily.   doxycycline 100 MG capsule Commonly known as: VIBRAMYCIN Take 1 capsule (100 mg total) by mouth 2 (two) times daily.   fenofibrate 54 MG tablet Take 1 tablet (54 mg total) by mouth daily.   freestyle lancets Use as directed three times a day to check blood sugar.  DX E11.9   FREESTYLE TEST STRIPS test strip Generic drug: glucose blood USE TO CHECK BLOOD SUGAR THREE TIMES DAILY   furosemide 20 MG tablet Commonly known as: LASIX TAKE 2 TABLETS BY MOUTH DAILY AS NEEDED FOR FLUID RETENTIONOR SWELLING    isosorbide mononitrate 30 MG 24 hr tablet Commonly known as: IMDUR TAKE 1 TABLET BY MOUTH DAILY   lisinopril 5 MG tablet Commonly known as: ZESTRIL TAKE 1 TABLET(5 MG) BY MOUTH DAILY   metFORMIN 500 MG 24 hr tablet Commonly known as: GLUCOPHAGE-XR TAKE 1 TABLET(500 MG) BY MOUTH DAILY WITH SUPPER   metolazone 2.5 MG tablet Commonly known as: ZAROXOLYN metolazone 2.5 mg tablet   metoprolol tartrate 25 MG tablet Commonly known as: LOPRESSOR TAKE 1/2 TABLET BY MOUTH TWICE DAILY   nitroGLYCERIN 0.4 MG SL tablet Commonly known as: NITROSTAT PLACE 1 TABLET UNDER THE TONGUE EVERY 5 MINUTES AS NEEDED FOR CHEST PAIN   pantoprazole 40 MG tablet Commonly known as: PROTONIX TAKE 1 TABLET(40 MG) BY MOUTH DAILY   Pifeltro 100 MG Tabs tablet Generic drug: doravirine TAKE 1 TABLET(100 MG) BY MOUTH DAILY   pioglitazone 15 MG tablet Commonly known as: ACTOS TAKE 1 TABLET(15 MG) BY MOUTH DAILY   potassium chloride SA 20 MEQ tablet Commonly known as: K-DUR  Take 1 tablet (20 mEq total) by mouth 3 (three) times daily.   Probiotic Daily Caps Take 1 by mouth daily   repaglinide 2 MG tablet Commonly known as: PRANDIN Take 2 mg by mouth 3 (three) times daily before meals. Take 2 tablets by mouth every day at breakfast, and 1 tablet at lunch and dinner.   sodium bicarbonate 650 MG tablet Take 650 mg by mouth 3 (three) times daily.   tamsulosin 0.4 MG Caps capsule Commonly known as: FLOMAX TK 1 C PO BID   Tivicay 50 MG tablet Generic drug: dolutegravir TAKE 1 TABLET BY MOUTH DAILY( STOP JULUCA AND PREZCOBIX)   triamcinolone cream 0.1 % Commonly known as: KENALOG apply twice daily as needed to dry, itchy skin   valACYclovir 500 MG tablet Commonly known as: VALTREX TAKE 1 TABLET BY MOUTH DAILY   Vascepa 1 g Caps Generic drug: Icosapent Ethyl TAKE 2 CAPSULES(2 GRAMS) BY MOUTH TWICE DAILY       Allergies: No Known Allergies  Past Medical History:  Diagnosis Date  .  Absolute anemia 05/28/2014  . Aortic stenosis 09/17/2015  . Arthritis   . Ascites 11/18/2013  . BELLS PALSY 07/19/2010   Qualifier: Diagnosis of  By: [name], [name]    . CAD (coronary artery disease)    a. s/p CABG in 2003 b. s/p PCI to SVG-PDA in 07/2015 c. 11/2016: cath showing severe native CAD with patent LIMA-LAD and SVG-D1 with 80% stenosis of SVG-OM1-OM2. Initially medical management was recommended --> presented with recurrent angina --> s/p Synergy DES to proximal body of SVG-OM1-OM2, POBA to distal graft.   Marland Kitchen CAD- S/P PCI SVG-OM1 12/01/16 05/12/2006   Qualifier: Diagnosis of  By: [name], [name]    . Carotid bruit 07/10/2015  . Chest pain 11/22/2016  . Chronic kidney disease   . Chronic renal insufficiency, stage III (moderate) ([place]) 12/02/2016  . Constipation 05/28/2014  . COPD 07/20/2008   Qualifier: Diagnosis of  By: [name], [name]   . DEPRESSION 09/03/2006   Qualifier: Diagnosis of  By: [name], [name]    . Dermatitis 11/27/2012  . Diabetes mellitus   . Diarrhea 11/20/2013  . DM (diabetes mellitus), type 2 with ophthalmic complications ([place]) 0000000   Qualifier: Diagnosis of  By: [name], [name]    . Dry eye syndrome 12/20/2010  . Epicondylitis 06/05/2013   right  . Erectile dysfunction 01/01/2012  . Essential hypertension 05/12/2006   Qualifier: Diagnosis of  By: [name], [name]    . GENITAL HERPES 05/03/2009   Qualifier: Diagnosis of  By: [name], [name]    . GERD 09/03/2006   Qualifier: Diagnosis of  By: [name], [name]    . GERD (gastroesophageal reflux disease)   . HEARING LOSS, SENSORINEURAL 05/12/2006   Qualifier: Diagnosis of  By: [name], [name]    . HEMATOCHEZIA 04/18/2008   Annotation: 9/09 during bout of constipation Qualifier: Diagnosis of  By: [name], [name]    . HIP PAIN, BILATERAL 07/17/2008   Qualifier: Diagnosis of  By: [name], [name]   . History of depression   . History of kidney stones   . HIV (human immunodeficiency virus infection)  ([place]) 1991   on meds since initial dx.   Marland Kitchen HLD (hyperlipidemia) 11/19/2013  . Human immunodeficiency virus (HIV) disease ([place]) 05/12/2006   Qualifier: Diagnosis of  By: [name], [name]    . Hx of CABG 09/03/2006   Annotation: 2003 Qualifier: Diagnosis of  By: [name], [name]    . Hyperkalemia 03/23/2014  . Hyperlipidemia   . Hypertension   . Hyponatremia 03/23/2014  . INGUINAL LYMPHADENOPATHY, RIGHT 04/03/2009   Qualifier: Diagnosis of  By: [name], [name]    . Keratoma 01/24/2015  . KNEE PAIN, BILATERAL 07/17/2008   Qualifier: Diagnosis of  By: [name], [name]   . Lesion of breast 07/21/2015  . Lipodystrophy 12/20/2010  . Memory loss 09/18/2008   Qualifier: Diagnosis of  By: [name], [name]   . Metatarsal deformity 01/24/2015  . Nasal abscess 12/04/2015  . Night sweats 07/06/2012  . Nocturia 03/06/2013  . NSTEMI (non-ST elevated myocardial infarction) ([place]) 11/29/2016  . Pain in joint, ankle and foot 01/19/2015  . Pancreatitis 11/2013   attributed to HIV meds.   . Pedal edema 05/21/2014  . PERIPHERAL VASCULAR DISEASE 07/20/2008   Qualifier: Diagnosis of  By: [name], [name]   . Posterior cervical lymphadenopathy 03/30/2014  . Protein-calorie malnutrition, severe ([place]) 03/23/2014  . SHINGLES, HX OF 05/03/2009   Annotation: R leg Qualifier: Diagnosis of  By: [name], [name]    . STEMI (ST elevation myocardial infarction) ([place]) 07/29/2015  . Unstable angina ([place]) 11/24/2016  . WEIGHT LOSS, ABNORMAL 04/03/2009   Qualifier: Diagnosis of  By: [name], [name]      Past Surgical History:  Procedure Laterality Date  . CARDIAC CATHETERIZATION N/A 07/29/2015   Procedure: Left Heart Cath and Coronary Angiography;  Surgeon: [name], MD;  Location: [facility];  Service: Cardiovascular;  Laterality: N/A;  . CARDIAC CATHETERIZATION N/A 07/29/2015   Procedure: Coronary Stent Intervention;  Surgeon: [name], MD;  Location: [facility];  Service: Cardiovascular;   Laterality: N/A;  . CORONARY ARTERY BYPASS GRAFT  05/2002  . CORONARY STENT INTERVENTION N/A 12/01/2016   Procedure: Coronary Stent Intervention;  Surgeon: [name], MD;  Location: [facility];  Service: Cardiovascular;  Laterality: N/A;  . CORONARY STENT INTERVENTION N/A 10/21/2017   Procedure: CORONARY STENT INTERVENTION;  Surgeon: Jettie Booze, MD;  Location: [facility];  Service: Cardiovascular;  Laterality: N/A;  . LEFT HEART CATH AND CORS/GRAFTS ANGIOGRAPHY N/A 11/24/2016   Procedure: Left Heart Cath and Cors/Grafts Angiography;  Surgeon: Nelva Bush, MD;  Location: [facility];  Service: Cardiovascular;  Laterality: N/A;  . LEFT HEART CATH AND CORS/GRAFTS ANGIOGRAPHY N/A 12/01/2016   Procedure: Left Heart Cath and Cors/Grafts Angiography;  Surgeon: [name], MD;  Location: [facility];  Service: Cardiovascular;  Laterality: N/A;  . LEFT HEART CATH AND CORS/GRAFTS ANGIOGRAPHY N/A 10/21/2017   Procedure: LEFT HEART CATH AND CORS/GRAFTS ANGIOGRAPHY;  Surgeon: Jettie Booze, MD;  Location: [facility];  Service: Cardiovascular;  Laterality: N/A;  . VASECTOMY      Family History  Problem Relation Age of Onset  . Hyperlipidemia Mother   . Diabetes Mother        paternal grandparents/1 brother  . Hyperlipidemia Father   . Hypertension Father        paternal grandmother/3 brothers/1 sister  . Arthritis Other        mother/father/paternal grandparents  . Breast cancer Maternal Aunt        paternal aunt  . Lung cancer Maternal Aunt   . Heart disease Other        parents/maternal grandparents/ 2 brothers  . Stroke Paternal Grandmother   . Mental retardation Sister   .  Colon cancer Neg Hx   . Esophageal cancer Neg Hx   . Pancreatic cancer Neg Hx   . Stomach cancer Neg Hx   . Liver disease Neg Hx     Social History:  reports that he has never smoked. He has never used smokeless tobacco. He reports current alcohol use.  He reports that he does not use drugs.    Review of Systems    Lipid history: On Lipitor 40 mg, followed by PCP His triglycerides were nearly 410 previously and also taking Vascepa with excellent control  Also has significant history of CAD   Lab Results  Component Value Date   CHOL 99 04/26/2019   HDL 27.90 (L) 04/26/2019   LDLCALC 58 04/26/2019   LDLDIRECT 72.0 11/04/2016   TRIG 67.0 04/26/2019   CHOLHDL 4 04/26/2019            RENAL dysfunction: Followed by nephrologist, renal function is slightly better  Lab Results  Component Value Date   CREATININE 1.67 (H) 04/26/2019   CREATININE 1.83 (H) 02/17/2019   CREATININE 1.73 (H) 10/20/2018      Hypertension:taking 5 mg lisinopril with 25 mg metoprolol  Followed by cardiologist and blood pressure is usually normal  BP Readings from Last 3 Encounters:  04/13/19 127/70  03/03/19 129/68  10/27/18 100/60    Diabetic foot exam  in 5/19 shows normal monofilament sensation in the toes and plantar surfaces, no skin lesions or ulcers on the feet and absent pedal pulses  He is going to get his eye exams before the end of the year  Physical Examination:  There were no vitals taken for this visit.  No pedal edema  ASSESSMENT:  Diabetes type 2, with BMI 25; has  abdominal obesity   See history of present illness for detailed discussion of current diabetes management, blood sugar patterns and problems identified  A1c is 7.2 Previously fructosamine was 253 and this may be more accurate because of renal insufficiency  Although his blood sugars at home are fairly good he is only checking readings in the morning and no readings after meals Usually has had history of postprandial hyperglycemia but again discussed that we do not know if his sugars are consistently controlled after meals He does have mild hypoglycemic symptoms in the afternoons if he is more active  No side effects from any of his medications including Actos   RENAL insufficiency: Relatively better, continue follow-up with nephrologist This is not from diabetes as microalbumin is again normal  Lipids: LDL 58, adequately controlled, also triglycerides are staying normal   PLAN:  When he is planning to be active in the afternoon he will take only half tablet of brandy Discussed treating low sugars with a sleep drink or glucose tablets/raisins or hard candy  He will try to do some formal walking on the days he is not active Continue to keep portions of high fat and high carbohydrate meals small To have foot exam on the next visit He will have his eye exam report forwarded when done Follow-up in 4 months  Total telephone encounter visit time =8.5 minutes  There are no Patient Instructions on file for this visit.   Elayne Snare 04/29/2019, 10:49 AM   Note: This office note was prepared with Dragon voice recognition system technology. Any transcriptional errors that result from this process are unintentional.

## 2019-05-02 ENCOUNTER — Telehealth: Payer: Self-pay | Admitting: Endocrinology

## 2019-05-02 ENCOUNTER — Other Ambulatory Visit: Payer: Self-pay | Admitting: Endocrinology

## 2019-05-02 NOTE — Telephone Encounter (Signed)
-----   Message from Elayne Snare, MD sent at 04/29/2019  1:54 PM EDT ----- Regarding: Next appointment Next appointment 4 months with labs

## 2019-05-02 NOTE — Telephone Encounter (Signed)
Patient will call us once he has his calendar.

## 2019-05-03 ENCOUNTER — Ambulatory Visit: Payer: Medicare Other | Admitting: Cardiology

## 2019-05-13 ENCOUNTER — Other Ambulatory Visit: Payer: Self-pay | Admitting: Adult Health

## 2019-05-13 ENCOUNTER — Other Ambulatory Visit: Payer: Self-pay | Admitting: Endocrinology

## 2019-05-13 NOTE — Telephone Encounter (Signed)
DENIED.  FILLED FOR 6 MONTHS ON 01/07/2019.  REQUEST IS TOO EARLY.

## 2019-05-18 ENCOUNTER — Encounter: Payer: Self-pay | Admitting: Adult Health

## 2019-05-18 DIAGNOSIS — R3912 Poor urinary stream: Secondary | ICD-10-CM | POA: Diagnosis not present

## 2019-05-18 DIAGNOSIS — R3915 Urgency of urination: Secondary | ICD-10-CM | POA: Diagnosis not present

## 2019-05-18 DIAGNOSIS — Z23 Encounter for immunization: Secondary | ICD-10-CM | POA: Diagnosis not present

## 2019-05-18 DIAGNOSIS — N401 Enlarged prostate with lower urinary tract symptoms: Secondary | ICD-10-CM | POA: Diagnosis not present

## 2019-05-20 MED ORDER — FUROSEMIDE 20 MG PO TABS: 40.0000 mg | ORAL_TABLET | Freq: Every day | ORAL | 0 refills | Status: DC

## 2019-05-21 ENCOUNTER — Other Ambulatory Visit: Payer: Self-pay | Admitting: Endocrinology

## 2019-05-30 ENCOUNTER — Encounter: Payer: Self-pay | Admitting: Podiatry

## 2019-05-30 ENCOUNTER — Other Ambulatory Visit: Payer: Self-pay

## 2019-05-30 ENCOUNTER — Ambulatory Visit (INDEPENDENT_AMBULATORY_CARE_PROVIDER_SITE_OTHER): Payer: Medicare Other | Admitting: Podiatry

## 2019-05-30 DIAGNOSIS — M79675 Pain in left toe(s): Secondary | ICD-10-CM

## 2019-05-30 DIAGNOSIS — B351 Tinea unguium: Secondary | ICD-10-CM | POA: Diagnosis not present

## 2019-05-30 DIAGNOSIS — L600 Ingrowing nail: Secondary | ICD-10-CM

## 2019-05-30 DIAGNOSIS — M79674 Pain in right toe(s): Secondary | ICD-10-CM

## 2019-05-30 DIAGNOSIS — L84 Corns and callosities: Secondary | ICD-10-CM

## 2019-05-30 DIAGNOSIS — E1151 Type 2 diabetes mellitus with diabetic peripheral angiopathy without gangrene: Secondary | ICD-10-CM

## 2019-05-30 NOTE — Patient Instructions (Addendum)
EPSOM SALT FOOT SOAK INSTRUCTIONS  1.  Place 1/4 cup of epsom salts in 2 quarts of warm tap water. IF YOU ARE DIABETIC, OR HAVE NEUROPATHY,  CHECK THE TEMPERATURE OF THE WATER WITH YOUR ELBOW.  2.  Submerge your foot/feet in the solution and soak for 10 minutes.      3.  Next, remove your foot or feet from solution, blot dry the affected area.    4.  Apply antibiotic ointment and cover with fabric band-aid .  5.  This soak should be done once a day for 3 days.   6.  Monitor for any signs/symptoms of infection such as redness, swelling, odor, drainage, increased pain, or non-healing of digit.   7.  Please do not hesitate to call the office and speak to a Nurse or Doctor if you have questions.   8.  If you experience fever, chills, nightsweats, nausea or vomiting with worsening of digit, please go to the emergency room.   Apply antibiotic ointment to right great toenail once daily for 5 days

## 2019-06-01 NOTE — Progress Notes (Signed)
Subjective: [name] is seen today for preventative diabetic foot care follow up. Today, he relates discomfort of right great toe. He denies any redness, drainage or swelling. Aggravating factor is wearing enclosed shoe gear.  He voices no other pedal concerns on today's visit.  Current Outpatient Medications on File Prior to Visit  Medication Sig  . acetaminophen (TYLENOL) 325 MG tablet Take 2 tablets (650 mg total) by mouth every 4 (four) hours as needed for headache or mild pain.  Marland Kitchen aspirin 81 MG chewable tablet Chew 81 mg by mouth daily.  Marland Kitchen atorvastatin (LIPITOR) 20 MG tablet TAKE 1 TABLET(20 MG) BY MOUTH DAILY  . b complex vitamins tablet Take 1 tablet by mouth daily.  . Cholecalciferol 2000 units CAPS Take 1 capsule (2,000 Units total) by mouth daily.  . clopidogrel (PLAVIX) 75 MG tablet TAKE 1 TABLET(75 MG) BY MOUTH DAILY  . clopidogrel (PLAVIX) 75 MG tablet Take 1 tablet (75 mg total) by mouth daily.  Marland Kitchen doxycycline (VIBRAMYCIN) 100 MG capsule Take 1 capsule (100 mg total) by mouth 2 (two) times daily.  . fenofibrate 54 MG tablet Take 1 tablet (54 mg total) by mouth daily.  Marland Kitchen FREESTYLE TEST STRIPS test strip USE TO CHECK BLOOD SUGAR THREE TIMES DAILY  . furosemide (LASIX) 20 MG tablet Take 2 tablets (40 mg total) by mouth daily.  . isosorbide mononitrate (IMDUR) 30 MG 24 hr tablet TAKE 1 TABLET BY MOUTH DAILY  . Lancets (FREESTYLE) lancets Use as directed three times a day to check blood sugar.  DX E11.9  . lisinopril (PRINIVIL,ZESTRIL) 5 MG tablet TAKE 1 TABLET(5 MG) BY MOUTH DAILY  . metFORMIN (GLUCOPHAGE-XR) 500 MG 24 hr tablet TAKE 1 TABLET(500 MG) BY MOUTH DAILY WITH SUPPER  . metolazone (ZAROXOLYN) 2.5 MG tablet metolazone 2.5 mg tablet  . metoprolol tartrate (LOPRESSOR) 25 MG tablet TAKE 1/2 TABLET BY MOUTH TWICE DAILY  . nitroGLYCERIN (NITROSTAT) 0.4 MG SL tablet PLACE 1 TABLET UNDER THE TONGUE EVERY 5 MINUTES AS NEEDED FOR CHEST PAIN  . pantoprazole (PROTONIX) 40 MG  tablet TAKE 1 TABLET(40 MG) BY MOUTH DAILY  . PIFELTRO 100 MG TABS tablet TAKE 1 TABLET(100 MG) BY MOUTH DAILY  . pioglitazone (ACTOS) 15 MG tablet TAKE 1 TABLET(15 MG) BY MOUTH DAILY  . potassium chloride SA (K-DUR) 20 MEQ tablet Take 1 tablet (20 mEq total) by mouth 3 (three) times daily.  . Probiotic Product (PROBIOTIC DAILY) CAPS Take 1 by mouth daily  . repaglinide (PRANDIN) 2 MG tablet Take 2 mg by mouth 3 (three) times daily before meals. Take 2 tablets by mouth every day at breakfast, and 1 tablet at lunch and dinner.  . sodium bicarbonate 650 MG tablet Take 650 mg by mouth 3 (three) times daily.  . tamsulosin (FLOMAX) 0.4 MG CAPS capsule TK 1 C PO BID  . TIVICAY 50 MG tablet TAKE 1 TABLET BY MOUTH DAILY( STOP JULUCA AND PREZCOBIX)  . triamcinolone cream (KENALOG) 0.1 % apply twice daily as needed to dry, itchy skin  . valACYclovir (VALTREX) 500 MG tablet TAKE 1 TABLET BY MOUTH DAILY  . VASCEPA 1 g CAPS TAKE 2 CAPSULES(2 GRAMS) BY MOUTH TWICE DAILY   No current facility-administered medications on file prior to visit.      No Known Allergies   Objective:  Vascular Examination: Capillary refill time <4 seconds b/l feet.  Dorsalis pedis and Posterior tibial pulses absent b/l.  Digital hair absent b/l.  Skin temperature gradient WNL b/l.  Dermatological Examination: Pedal skin is thin and atrophic b/l.  Toenails 1-5 b/l discolored, thick, dystrophic with subungual debris and pain with palpation to nailbeds due to thickness of nails.  Incurvated nailplate right great toe lateral border with tenderness to palpation. No erythema, no edema, no drainage noted.  Hyperkeratotic lesion submet head 5 left foot with tenderness to palpation. No edema, no erythema, no drainage, no flocculence.  Musculoskeletal: Muscle strength 5/5 to all LE muscle groups b/l.  No gross bony deformities b/l.  No pain, crepitus or joint limitation noted with ROM.   Neurological  Examination: Protective sensation intact 5/5 with 10 gram monofilament bilaterally.  Epicritic sensation present bilaterally.  Vibratory sensation intact bilaterally.   Assessment: Painful onychomycosis toenails 1-5 b/l  Callus submet head 5 left foot Ingrown toenail right hallux lateral border, noninfected NIDDM with PAD  Plan: 1. Continue diabetic foot care principles.  2. Toenails 1-5 b/l were debrided in length and girth without iatrogenic bleeding. Offending nail border debrided and curretaged right hallux. Border cleansed with alcohol and triple antibiotic applied. Patient given written instructions for epsom salt soaks once daily for 3 days.  Call office if condition does not resolve.  Calluses pared submetatarsal head 5 left foot utilizing sterile scalpel blade without incident. 3. Patient to continue soft, supportive shoe gear. 4. Patient to report any pedal injuries to medical professional immediately. 5. Follow up 3 months.  6. Patient/POA to call should there be a concern in the interim.

## 2019-06-05 ENCOUNTER — Other Ambulatory Visit: Payer: Self-pay | Admitting: Internal Medicine

## 2019-06-05 ENCOUNTER — Other Ambulatory Visit: Payer: Self-pay | Admitting: Endocrinology

## 2019-06-07 ENCOUNTER — Encounter: Payer: Self-pay | Admitting: Adult Health

## 2019-06-07 ENCOUNTER — Ambulatory Visit (INDEPENDENT_AMBULATORY_CARE_PROVIDER_SITE_OTHER): Payer: Medicare Other | Admitting: Adult Health

## 2019-06-07 ENCOUNTER — Other Ambulatory Visit: Payer: Self-pay | Admitting: Endocrinology

## 2019-06-07 ENCOUNTER — Other Ambulatory Visit: Payer: Self-pay

## 2019-06-07 VITALS — BP 118/68 | Temp 96.8°F | Ht 63.5 in | Wt 139.0 lb

## 2019-06-07 DIAGNOSIS — E782 Mixed hyperlipidemia: Secondary | ICD-10-CM | POA: Diagnosis not present

## 2019-06-07 DIAGNOSIS — I10 Essential (primary) hypertension: Secondary | ICD-10-CM

## 2019-06-07 DIAGNOSIS — Z21 Asymptomatic human immunodeficiency virus [HIV] infection status: Secondary | ICD-10-CM | POA: Diagnosis not present

## 2019-06-07 DIAGNOSIS — N1832 Chronic kidney disease, stage 3b: Secondary | ICD-10-CM | POA: Diagnosis not present

## 2019-06-07 DIAGNOSIS — I2510 Atherosclerotic heart disease of native coronary artery without angina pectoris: Secondary | ICD-10-CM

## 2019-06-07 DIAGNOSIS — E11319 Type 2 diabetes mellitus with unspecified diabetic retinopathy without macular edema: Secondary | ICD-10-CM

## 2019-06-07 NOTE — Progress Notes (Signed)
Subjective:    Patient ID: [name], male    DOB: [birth year], 73 y.o.   MRN: [id number]  HPI   Patient presents for yearly preventative medicine examination. He is a pleasant 73 year old male who  has a past medical history of Absolute anemia (05/28/2014), Aortic stenosis (09/17/2015), Arthritis, Ascites (11/18/2013), BELLS PALSY (07/19/2010), CAD (coronary artery disease), CAD- S/P PCI SVG-OM1 12/01/16 (05/12/2006), Carotid bruit (07/10/2015), Chest pain (11/22/2016), Chronic kidney disease, Chronic renal insufficiency, stage III (moderate) (12/02/2016), Constipation (05/28/2014), COPD (07/20/2008), DEPRESSION (09/03/2006), Dermatitis (11/27/2012), Diabetes mellitus, Diarrhea (11/20/2013), DM (diabetes mellitus), type 2 with ophthalmic complications ([place]) (0000000), Dry eye syndrome (12/20/2010), Epicondylitis (06/05/2013), Erectile dysfunction (01/01/2012), Essential hypertension (05/12/2006), GENITAL HERPES (05/03/2009), GERD (09/03/2006), GERD (gastroesophageal reflux disease), HEARING LOSS, SENSORINEURAL (05/12/2006), HEMATOCHEZIA (04/18/2008), HIP PAIN, BILATERAL (07/17/2008), History of depression, History of kidney stones, HIV (human immunodeficiency virus infection) ([place]) (1991), HLD (hyperlipidemia) (11/19/2013), Human immunodeficiency virus (HIV) disease ([place]) (05/12/2006), CABG (09/03/2006), Hyperkalemia (03/23/2014), Hyperlipidemia, Hypertension, Hyponatremia (03/23/2014), INGUINAL LYMPHADENOPATHY, RIGHT (04/03/2009), Keratoma (01/24/2015), KNEE PAIN, BILATERAL (07/17/2008), Lesion of breast (07/21/2015), Lipodystrophy (12/20/2010), Memory loss (09/18/2008), Metatarsal deformity (01/24/2015), Nasal abscess (12/04/2015), Night sweats (07/06/2012), Nocturia (03/06/2013), NSTEMI (non-ST elevated myocardial infarction) ([place]) (11/29/2016), Pain in joint, ankle and foot (01/19/2015), Pancreatitis (11/2013), Pedal edema (05/21/2014), PERIPHERAL VASCULAR DISEASE (07/20/2008), Posterior cervical lymphadenopathy (03/30/2014),  Protein-calorie malnutrition, severe ([place]) (03/23/2014), SHINGLES, HX OF (05/03/2009), STEMI (ST elevation myocardial infarction) ([place]) (07/29/2015), Unstable angina ([place]) (11/24/2016), and WEIGHT LOSS, ABNORMAL (04/03/2009).  HIV-is followed by infectious disease.  Currently prescribed Tivicay and Pifeltro.  He reports no problems tolerating his medications.  Was last seen in July 2020.  His infection remains under excellent control.  Hypertension -currently taking lisinopril 5 mg and metoprolol 25 mg.  He is followed by cardiology.  He denies dizziness, lightheadedness, chest pain, or shortness of breath  DM- is followed by Endocrinology -currently prescribed Prandin 4 mg at breakfast, 2 mg at dinner, and 2 mg before lunch, Actos 15 mg daily, and Metformin extended release 500 mg daily.  He reports that his range is usually between 70 and 140.  No readings over 200. Lab Results  Component Value Date   HGBA1C 7.2 (H) 04/26/2019   Hyperlipidemia/CAD -currently prescribed Lipitor 40 mg, fenofibrate 54 mg, and Vascepa.  He denies fatigue or myalgia. He is managed by Cardiology  Lab Results  Component Value Date   CHOL 99 04/26/2019   HDL 27.90 (L) 04/26/2019   LDLCALC 58 04/26/2019   LDLDIRECT 72.0 11/04/2016   TRIG 67.0 04/26/2019   CHOLHDL 4 04/26/2019   CKD -is monitored by nephrology yearly.   BPH - is monitored by Urology. Reports that his last PSA was normal.    All immunizations and health maintenance protocols were reviewed with the patient and needed orders were placed.  He is up-to-date on all vaccinations  Appropriate screening laboratory values were ordered for the patient including screening of hyperlipidemia, renal function and hepatic function. If indicated by BPH, a PSA was ordered.  Medication reconciliation,  past medical history, social history, problem list and allergies were reviewed in detail with the patient  Goals were established with regard to weight loss,  exercise, and  diet in compliance with medications  End of life planning was discussed.  He is due for routine screening colonoscopy and diabetic eye exam. He needs to call and schedule his colonoscopy.    Review of Systems  Constitutional: Negative.   HENT: Negative.   Eyes: Negative.  Respiratory: Negative.   Cardiovascular: Negative.   Gastrointestinal: Negative.   Endocrine: Negative.   Genitourinary: Negative.   Musculoskeletal: Negative.   Skin: Negative.   Allergic/Immunologic: Negative.   Neurological: Negative.   Hematological: Negative.   Psychiatric/Behavioral: Negative.   All other systems reviewed and are negative.  Past Medical History:  Diagnosis Date   Absolute anemia 05/28/2014   Aortic stenosis 09/17/2015   Arthritis    Ascites 11/18/2013   BELLS PALSY 07/19/2010   Qualifier: Diagnosis of  By: [name], [name]     CAD (coronary artery disease)    a. s/p CABG in 2003 b. s/p PCI to SVG-PDA in 07/2015 c. 11/2016: cath showing severe native CAD with patent LIMA-LAD and SVG-D1 with 80% stenosis of SVG-OM1-OM2. Initially medical management was recommended --> presented with recurrent angina --> s/p Synergy DES to proximal body of SVG-OM1-OM2, POBA to distal graft.    CAD- S/P PCI SVG-OM1 12/01/16 05/12/2006   Qualifier: Diagnosis of  By: [name], [name]     Carotid bruit 07/10/2015   Chest pain 11/22/2016   Chronic kidney disease    Chronic renal insufficiency, stage III (moderate) 12/02/2016   Constipation 05/28/2014   COPD 07/20/2008   Qualifier: Diagnosis of  By: [name], [name]    DEPRESSION 09/03/2006   Qualifier: Diagnosis of  By: [name], [name]     Dermatitis 11/27/2012   Diabetes mellitus    Diarrhea 11/20/2013   DM (diabetes mellitus), type 2 with ophthalmic complications ([place]) 0000000   Qualifier: Diagnosis of  By: [name], [name]     Dry eye syndrome 12/20/2010   Epicondylitis 06/05/2013   right   Erectile dysfunction  01/01/2012   Essential hypertension 05/12/2006   Qualifier: Diagnosis of  By: [name], [name]     GENITAL HERPES 05/03/2009   Qualifier: Diagnosis of  By: [name], [name]     GERD 09/03/2006   Qualifier: Diagnosis of  By: [name], [name]     GERD (gastroesophageal reflux disease)    HEARING LOSS, SENSORINEURAL 05/12/2006   Qualifier: Diagnosis of  By: [name], [name]     HEMATOCHEZIA 04/18/2008   Annotation: 9/09 during bout of constipation Qualifier: Diagnosis of  By: [name], [name]     HIP PAIN, BILATERAL 07/17/2008   Qualifier: Diagnosis of  By: [name], [name]    History of depression    History of kidney stones    HIV (human immunodeficiency virus infection) ([place]) 1991   on meds since initial dx.    HLD (hyperlipidemia) 11/19/2013   Human immunodeficiency virus (HIV) disease ([place]) 05/12/2006   Qualifier: Diagnosis of  By: [name], [name]     Hx of CABG 09/03/2006   Annotation: 2003 Qualifier: Diagnosis of  By: [name], [name]     Hyperkalemia 03/23/2014   Hyperlipidemia    Hypertension    Hyponatremia 03/23/2014   INGUINAL LYMPHADENOPATHY, RIGHT 04/03/2009   Qualifier: Diagnosis of  By: [name], [name] 01/24/2015   KNEE PAIN, BILATERAL 07/17/2008   Qualifier: Diagnosis of  By: [name], [name]    Lesion of breast 07/21/2015   Lipodystrophy 12/20/2010   Memory loss 09/18/2008   Qualifier: Diagnosis of  By: [name], [name]    Metatarsal deformity 01/24/2015   Nasal abscess 12/04/2015   Night sweats 07/06/2012   Nocturia 03/06/2013   NSTEMI (non-ST elevated myocardial infarction) ([place]) 11/29/2016   Pain in joint, ankle  and foot 01/19/2015   Pancreatitis 11/2013   attributed to HIV meds.    Pedal edema 05/21/2014   PERIPHERAL VASCULAR DISEASE 07/20/2008   Qualifier: Diagnosis of  By: [name], [name]    Posterior cervical lymphadenopathy 03/30/2014   Protein-calorie malnutrition, severe ([place]) 03/23/2014   SHINGLES, HX OF  05/03/2009   Annotation: R leg Qualifier: Diagnosis of  By: [name], [name]     STEMI (ST elevation myocardial infarction) ([place]) 07/29/2015   Unstable angina ([place]) 11/24/2016   WEIGHT LOSS, ABNORMAL 04/03/2009   Qualifier: Diagnosis of  By: [name], [name]      Social History   Socioeconomic History   Marital status: Divorced    Spouse name: Not on file   Number of children: 4   Years of education: Not on file   Highest education level: Not on file  Occupational History   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   Social Needs   Emergency planning/management officer strain: Not on file   Food insecurity    Worry: Not on file    Inability: Not on file   Transportation needs    Medical: Not on file    Non-medical: Not on file  Tobacco Use   Smoking status: Never Smoker   Smokeless tobacco: Never Used  Substance and Sexual Activity   Alcohol use: Yes    Alcohol/week: 0.0 standard drinks    Comment: rare   Drug use: No   Sexual activity: Not Currently    Comment: declined condoms  Lifestyle   Physical activity    Days per week: Not on file    Minutes per session: Not on file   Stress: Not on file  Relationships   Social connections    Talks on phone: Not on file    Gets together: Not on file    Attends religious service: Not on file    Active member of club or organization: Not on file    Attends meetings of clubs or organizations: Not on file    Relationship status: Not on file   Intimate partner violence    Fear of current or ex partner: Not on file    Emotionally abused: Not on file    Physically abused: Not on file    Forced sexual activity: Not on file  Other Topics Concern   Not on file  Social History Narrative   Lives alone.  Supportive friends and family.  His HIV Dx is not a secret.     Past Surgical History:  Procedure Laterality Date   CARDIAC CATHETERIZATION N/A 07/29/2015   Procedure:  Left Heart Cath and Coronary Angiography;  Surgeon: [name], MD;  Location: [facility];  Service: Cardiovascular;  Laterality: N/A;   CARDIAC CATHETERIZATION N/A 07/29/2015   Procedure: Coronary Stent Intervention;  Surgeon: [name], MD;  Location: [facility];  Service: Cardiovascular;  Laterality: N/A;   CORONARY ARTERY BYPASS GRAFT  05/2002   CORONARY STENT INTERVENTION N/A 12/01/2016   Procedure: Coronary Stent Intervention;  Surgeon: [name], MD;  Location: [facility];  Service: Cardiovascular;  Laterality: N/A;   CORONARY STENT INTERVENTION N/A 10/21/2017   Procedure: CORONARY STENT INTERVENTION;  Surgeon: Jettie Booze, MD;  Location: [facility];  Service: Cardiovascular;  Laterality: N/A;   LEFT HEART CATH AND CORS/GRAFTS ANGIOGRAPHY N/A 11/24/2016   Procedure: Left Heart Cath and  Cors/Grafts Angiography;  Surgeon: Nelva Bush, MD;  Location: [facility];  Service: Cardiovascular;  Laterality: N/A;   LEFT HEART CATH AND CORS/GRAFTS ANGIOGRAPHY N/A 12/01/2016   Procedure: Left Heart Cath and Cors/Grafts Angiography;  Surgeon: [name], MD;  Location: [facility];  Service: Cardiovascular;  Laterality: N/A;   LEFT HEART CATH AND CORS/GRAFTS ANGIOGRAPHY N/A 10/21/2017   Procedure: LEFT HEART CATH AND CORS/GRAFTS ANGIOGRAPHY;  Surgeon: Jettie Booze, MD;  Location: [facility];  Service: Cardiovascular;  Laterality: N/A;   VASECTOMY      Family History  Problem Relation Age of Onset   Hyperlipidemia Mother    Diabetes Mother        paternal grandparents/1 brother   Hyperlipidemia Father    Hypertension Father        paternal grandmother/3 brothers/1 sister   Arthritis Other        mother/father/paternal grandparents   Breast cancer Maternal Aunt        paternal aunt   Lung cancer Maternal Aunt    Heart disease Other        parents/maternal grandparents/ 2 brothers    Stroke Paternal Grandmother    Mental retardation Sister    Colon cancer Neg Hx    Esophageal cancer Neg Hx    Pancreatic cancer Neg Hx    Stomach cancer Neg Hx    Liver disease Neg Hx     No Known Allergies  Current Outpatient Medications on File Prior to Visit  Medication Sig Dispense Refill   acetaminophen (TYLENOL) 325 MG tablet Take 2 tablets (650 mg total) by mouth every 4 (four) hours as needed for headache or mild pain.     aspirin 81 MG chewable tablet Chew 81 mg by mouth daily.     atorvastatin (LIPITOR) 20 MG tablet TAKE 1 TABLET(20 MG) BY MOUTH DAILY 90 tablet 1   b complex vitamins tablet Take 1 tablet by mouth daily. 30 tablet 11   Cholecalciferol 2000 units CAPS Take 1 capsule (2,000 Units total) by mouth daily. 30 each 11   fenofibrate 54 MG tablet Take 1 tablet (54 mg total) by mouth daily. 90 tablet 3   FREESTYLE TEST STRIPS test strip USE TO CHECK BLOOD SUGAR THREE TIMES DAILY 300 each 3   furosemide (LASIX) 20 MG tablet Take 2 tablets (40 mg total) by mouth daily. 180 tablet 0   isosorbide mononitrate (IMDUR) 30 MG 24 hr tablet TAKE 1 TABLET BY MOUTH DAILY 90 tablet 2   Lancets (FREESTYLE) lancets Use as directed three times a day to check blood sugar.  DX E11.9 100 each 5   lisinopril (PRINIVIL,ZESTRIL) 5 MG tablet TAKE 1 TABLET(5 MG) BY MOUTH DAILY 90 tablet 3   metolazone (ZAROXOLYN) 2.5 MG tablet metolazone 2.5 mg tablet     metoprolol tartrate (LOPRESSOR) 25 MG tablet TAKE 1/2 TABLET BY MOUTH TWICE DAILY 60 tablet 3   nitroGLYCERIN (NITROSTAT) 0.4 MG SL tablet PLACE 1 TABLET UNDER THE TONGUE EVERY 5 MINUTES AS NEEDED FOR CHEST PAIN 25 tablet 3   pantoprazole (PROTONIX) 40 MG tablet TAKE 1 TABLET(40 MG) BY MOUTH DAILY 30 tablet 11   PIFELTRO 100 MG TABS tablet TAKE 1 TABLET(100 MG) BY MOUTH DAILY 30 tablet 5   pioglitazone (ACTOS) 15 MG tablet TAKE 1 TABLET(15 MG) BY MOUTH DAILY 30 tablet 3   Probiotic Product (PROBIOTIC DAILY) CAPS Take 1  by mouth daily 30 capsule 11   repaglinide (PRANDIN)  2 MG tablet TAKE 2 TABLETS BY MOUTH BEFORE BREAKFAST, 1 TABLET BEFORE LUNCH AND, 2 TABLETS BEFORE SUPPER 450 tablet 1   sodium bicarbonate 650 MG tablet Take 650 mg by mouth 3 (three) times daily.     tamsulosin (FLOMAX) 0.4 MG CAPS capsule TK 1 C PO BID  11   TIVICAY 50 MG tablet TAKE 1 TABLET BY MOUTH DAILY. STOP JULUCA AND PREZCOBIX 30 tablet 5   triamcinolone cream (KENALOG) 0.1 % apply twice daily as needed to dry, itchy skin  2   valACYclovir (VALTREX) 500 MG tablet TAKE 1 TABLET BY MOUTH DAILY 30 tablet 5   VASCEPA 1 g CAPS TAKE 2 CAPSULES(2 GRAMS) BY MOUTH TWICE DAILY 120 capsule 2   potassium chloride SA (K-DUR) 20 MEQ tablet Take 1 tablet (20 mEq total) by mouth 3 (three) times daily. 270 tablet 0   No current facility-administered medications on file prior to visit.     BP 118/68    Temp (!) 96.8 F (36 C) (Temporal)    Ht 5' 3.5" (1.613 m)    Wt 139 lb (63 kg)    BMI 24.24 kg/m       Objective:   Physical Exam Vitals signs and nursing note reviewed.  Constitutional:      Appearance: Normal appearance.  Cardiovascular:     Rate and Rhythm: Normal rate and regular rhythm.     Pulses: Normal pulses.     Heart sounds: Normal heart sounds.  Pulmonary:     Effort: Pulmonary effort is normal.     Breath sounds: Normal breath sounds.  Abdominal:     General: Abdomen is flat.     Palpations: Abdomen is soft.  Musculoskeletal: Normal range of motion.  Skin:    General: Skin is warm and dry.  Neurological:     General: No focal deficit present.     Mental Status: He is alert.  Psychiatric:        Mood and Affect: Mood normal.        Thought Content: Thought content normal.        Judgment: Judgment normal.       Assessment & Plan:   1. Type 2 diabetes mellitus with retinopathy without macular edema, unspecified laterality, unspecified retinopathy severity, unspecified whether long term insulin use ([place]) -  Labs done by Dr. Dwyane Dee in September. I do not feel as though we need to repeat them today.   2. Mixed hyperlipidemia - Well controlled. No change in medications. Follow up with Cardiology as directed   3. Essential hypertension - Well controlled. No change in medication   4. Chronic renal impairment, stage 3b - Follow up with Nephrology as directred  5. Asymptomatic HIV infection ([place]) - Follow up with ID as directed - well controlled.   Dorothyann Peng, NP

## 2019-06-07 NOTE — Patient Instructions (Signed)
It was great seeing you today   Please schedule your colonoscopy and eye exam   Please let me know if you need anything

## 2019-06-24 NOTE — Progress Notes (Signed)
HPI: FU coronary artery disease. Patient is status post coronary artery bypass and graft in 2003. Carotid Dopplers December 2016 showed 1-39% bilateral stenosis. Patient had PCI of the sequential graft to the ramus and OM branches4/18.Last echocardiogram March 2019 showed normal LV function, grade 1 diastolic dysfunction and mild mitral regurgitation. Patient had PCI of the saphenous vein graft to the diagonal andproximal graft of the saphenous vein graft to the obtuse marginal 3/19.The previously ballooned second part of SVG to obtuse marginal jump graft is now occluded, there were left to left collaterals filling the distal left circumflex artery.Since last seen,the patient denies any dyspnea on exertion, orthopnea, PND, pedal edema, palpitations, syncope or chest pain.   Current Outpatient Medications  Medication Sig Dispense Refill  . acetaminophen (TYLENOL) 325 MG tablet Take 2 tablets (650 mg total) by mouth every 4 (four) hours as needed for headache or mild pain.    Marland Kitchen aspirin 81 MG chewable tablet Chew 81 mg by mouth daily.    Marland Kitchen atorvastatin (LIPITOR) 20 MG tablet TAKE 1 TABLET(20 MG) BY MOUTH DAILY 90 tablet 1  . b complex vitamins tablet Take 1 tablet by mouth daily. 30 tablet 11  . Cholecalciferol 2000 units CAPS Take 1 capsule (2,000 Units total) by mouth daily. 30 each 11  . fenofibrate 54 MG tablet Take 1 tablet (54 mg total) by mouth daily. 90 tablet 3  . FREESTYLE TEST STRIPS test strip USE TO CHECK BLOOD SUGAR THREE TIMES DAILY 300 each 3  . furosemide (LASIX) 20 MG tablet Take 2 tablets (40 mg total) by mouth daily. 180 tablet 0  . isosorbide mononitrate (IMDUR) 30 MG 24 hr tablet TAKE 1 TABLET BY MOUTH DAILY 90 tablet 2  . Lancets (FREESTYLE) lancets Use as directed three times a day to check blood sugar.  DX E11.9 100 each 5  . lisinopril (PRINIVIL,ZESTRIL) 5 MG tablet TAKE 1 TABLET(5 MG) BY MOUTH DAILY 90 tablet 3  . metFORMIN (GLUCOPHAGE-XR) 500 MG 24 hr tablet  TAKE 1 TABLET(500 MG) BY MOUTH DAILY WITH SUPPER 90 tablet 1  . metolazone (ZAROXOLYN) 2.5 MG tablet metolazone 2.5 mg tablet    . metoprolol tartrate (LOPRESSOR) 25 MG tablet TAKE 1/2 TABLET BY MOUTH TWICE DAILY 60 tablet 3  . nitroGLYCERIN (NITROSTAT) 0.4 MG SL tablet PLACE 1 TABLET UNDER THE TONGUE EVERY 5 MINUTES AS NEEDED FOR CHEST PAIN 25 tablet 3  . pantoprazole (PROTONIX) 40 MG tablet TAKE 1 TABLET(40 MG) BY MOUTH DAILY 30 tablet 11  . PIFELTRO 100 MG TABS tablet TAKE 1 TABLET(100 MG) BY MOUTH DAILY 30 tablet 5  . pioglitazone (ACTOS) 15 MG tablet TAKE 1 TABLET(15 MG) BY MOUTH DAILY 30 tablet 3  . potassium chloride SA (K-DUR) 20 MEQ tablet Take 1 tablet (20 mEq total) by mouth 3 (three) times daily. 270 tablet 0  . Probiotic Product (PROBIOTIC DAILY) CAPS Take 1 by mouth daily 30 capsule 11  . repaglinide (PRANDIN) 2 MG tablet TAKE 2 TABLETS BY MOUTH BEFORE BREAKFAST, 1 TABLET BEFORE LUNCH AND, 2 TABLETS BEFORE SUPPER 450 tablet 1  . sodium bicarbonate 650 MG tablet Take 650 mg by mouth 3 (three) times daily.    . tamsulosin (FLOMAX) 0.4 MG CAPS capsule TK 1 C PO BID  11  . TIVICAY 50 MG tablet TAKE 1 TABLET BY MOUTH DAILY. STOP JULUCA AND PREZCOBIX 30 tablet 5  . triamcinolone cream (KENALOG) 0.1 % apply twice daily as needed to dry, itchy skin  2  . valACYclovir (VALTREX) 500 MG tablet TAKE 1 TABLET BY MOUTH DAILY 30 tablet 5  . VASCEPA 1 g CAPS TAKE 2 CAPSULES(2 GRAMS) BY MOUTH TWICE DAILY 120 capsule 2   No current facility-administered medications for this visit.      Past Medical History:  Diagnosis Date  . Absolute anemia 05/28/2014  . Aortic stenosis 09/17/2015  . Arthritis   . Ascites 11/18/2013  . BELLS PALSY 07/19/2010   Qualifier: Diagnosis of  By: [name], [name]    . CAD (coronary artery disease)    a. s/p CABG in 2003 b. s/p PCI to SVG-PDA in 07/2015 c. 11/2016: cath showing severe native CAD with patent LIMA-LAD and SVG-D1 with 80% stenosis of SVG-OM1-OM2.  Initially medical management was recommended --> presented with recurrent angina --> s/p Synergy DES to proximal body of SVG-OM1-OM2, POBA to distal graft.   Marland Kitchen CAD- S/P PCI SVG-OM1 12/01/16 05/12/2006   Qualifier: Diagnosis of  By: [name], [name]    . Carotid bruit 07/10/2015  . Chest pain 11/22/2016  . Chronic kidney disease   . Chronic renal insufficiency, stage III (moderate) 12/02/2016  . Constipation 05/28/2014  . COPD 07/20/2008   Qualifier: Diagnosis of  By: [name], [name]   . DEPRESSION 09/03/2006   Qualifier: Diagnosis of  By: [name], [name]    . Dermatitis 11/27/2012  . Diabetes mellitus   . Diarrhea 11/20/2013  . DM (diabetes mellitus), type 2 with ophthalmic complications ([place]) 0000000   Qualifier: Diagnosis of  By: [name], [name]    . Dry eye syndrome 12/20/2010  . Epicondylitis 06/05/2013   right  . Erectile dysfunction 01/01/2012  . Essential hypertension 05/12/2006   Qualifier: Diagnosis of  By: [name], [name]    . GENITAL HERPES 05/03/2009   Qualifier: Diagnosis of  By: [name], [name]    . GERD 09/03/2006   Qualifier: Diagnosis of  By: [name], [name]    . GERD (gastroesophageal reflux disease)   . HEARING LOSS, SENSORINEURAL 05/12/2006   Qualifier: Diagnosis of  By: [name], [name]    . HEMATOCHEZIA 04/18/2008   Annotation: 9/09 during bout of constipation Qualifier: Diagnosis of  By: [name], [name]    . HIP PAIN, BILATERAL 07/17/2008   Qualifier: Diagnosis of  By: [name], [name]   . History of depression   . History of kidney stones   . HIV (human immunodeficiency virus infection) ([place]) 1991   on meds since initial dx.   Marland Kitchen HLD (hyperlipidemia) 11/19/2013  . Human immunodeficiency virus (HIV) disease ([place]) 05/12/2006   Qualifier: Diagnosis of  By: [name], [name]    . Hx of CABG 09/03/2006   Annotation: 2003 Qualifier: Diagnosis of  By: [name], [name]    . Hyperkalemia 03/23/2014  . Hyperlipidemia   . Hypertension   . Hyponatremia 03/23/2014   . INGUINAL LYMPHADENOPATHY, RIGHT 04/03/2009   Qualifier: Diagnosis of  By: [name], [name]    . Keratoma 01/24/2015  . KNEE PAIN, BILATERAL 07/17/2008   Qualifier: Diagnosis of  By: [name], [name]   . Lesion of breast 07/21/2015  . Lipodystrophy 12/20/2010  . Memory loss 09/18/2008   Qualifier: Diagnosis of  By: [name], [name]   . Metatarsal deformity 01/24/2015  . Nasal abscess 12/04/2015  . Night sweats 07/06/2012  . Nocturia 03/06/2013  . NSTEMI (non-ST elevated myocardial infarction) ([place]) 11/29/2016  . Pain in joint, ankle and foot 01/19/2015  .  Pancreatitis 11/2013   attributed to HIV meds.   . Pedal edema 05/21/2014  . PERIPHERAL VASCULAR DISEASE 07/20/2008   Qualifier: Diagnosis of  By: [name], [name]   . Posterior cervical lymphadenopathy 03/30/2014  . Protein-calorie malnutrition, severe ([place]) 03/23/2014  . SHINGLES, HX OF 05/03/2009   Annotation: R leg Qualifier: Diagnosis of  By: [name], [name]    . STEMI (ST elevation myocardial infarction) ([place]) 07/29/2015  . Unstable angina ([place]) 11/24/2016  . WEIGHT LOSS, ABNORMAL 04/03/2009   Qualifier: Diagnosis of  By: [name], [name]      Past Surgical History:  Procedure Laterality Date  . CARDIAC CATHETERIZATION N/A 07/29/2015   Procedure: Left Heart Cath and Coronary Angiography;  Surgeon: [name], MD;  Location: [facility];  Service: Cardiovascular;  Laterality: N/A;  . CARDIAC CATHETERIZATION N/A 07/29/2015   Procedure: Coronary Stent Intervention;  Surgeon: [name], MD;  Location: [facility];  Service: Cardiovascular;  Laterality: N/A;  . CORONARY ARTERY BYPASS GRAFT  05/2002  . CORONARY STENT INTERVENTION N/A 12/01/2016   Procedure: Coronary Stent Intervention;  Surgeon: [name], MD;  Location: [facility];  Service: Cardiovascular;  Laterality: N/A;  . CORONARY STENT INTERVENTION N/A 10/21/2017   Procedure: CORONARY STENT INTERVENTION;  Surgeon: Jettie Booze, MD;   Location: [facility];  Service: Cardiovascular;  Laterality: N/A;  . LEFT HEART CATH AND CORS/GRAFTS ANGIOGRAPHY N/A 11/24/2016   Procedure: Left Heart Cath and Cors/Grafts Angiography;  Surgeon: Nelva Bush, MD;  Location: [facility];  Service: Cardiovascular;  Laterality: N/A;  . LEFT HEART CATH AND CORS/GRAFTS ANGIOGRAPHY N/A 12/01/2016   Procedure: Left Heart Cath and Cors/Grafts Angiography;  Surgeon: [name], MD;  Location: [facility];  Service: Cardiovascular;  Laterality: N/A;  . LEFT HEART CATH AND CORS/GRAFTS ANGIOGRAPHY N/A 10/21/2017   Procedure: LEFT HEART CATH AND CORS/GRAFTS ANGIOGRAPHY;  Surgeon: Jettie Booze, MD;  Location: [facility];  Service: Cardiovascular;  Laterality: N/A;  . VASECTOMY      Social History   Socioeconomic History  . Marital status: Divorced    Spouse name: Not on file  . Number of children: 4  . Years of education: Not on file  . Highest education level: Not on file  Occupational History  . 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   Social Needs  . Financial resource strain: Not on file  . Food insecurity    Worry: Not on file    Inability: Not on file  . Transportation needs    Medical: Not on file    Non-medical: Not on file  Tobacco Use  . Smoking status: Never Smoker  . Smokeless tobacco: Never Used  Substance and Sexual Activity  . Alcohol use: Yes    Alcohol/week: 0.0 standard drinks    Comment: rare  . Drug use: No  . Sexual activity: Not Currently    Comment: declined condoms  Lifestyle  . Physical activity    Days per week: Not on file    Minutes per session: Not on file  . Stress: Not on file  Relationships  . Social Herbalist on phone: Not on file    Gets together: Not on file    Attends religious service: Not on file    Active member of club or organization: Not on file  Attends meetings of clubs or  organizations: Not on file    Relationship status: Not on file  . Intimate partner violence    Fear of current or ex partner: Not on file    Emotionally abused: Not on file    Physically abused: Not on file    Forced sexual activity: Not on file  Other Topics Concern  . Not on file  Social History Narrative   Lives alone.  Supportive friends and family.  His HIV Dx is not a secret.     Family History  Problem Relation Age of Onset  . Hyperlipidemia Mother   . Diabetes Mother        paternal grandparents/1 brother  . Hyperlipidemia Father   . Hypertension Father        paternal grandmother/3 brothers/1 sister  . Arthritis Other        mother/father/paternal grandparents  . Breast cancer Maternal Aunt        paternal aunt  . Lung cancer Maternal Aunt   . Heart disease Other        parents/maternal grandparents/ 2 brothers  . Stroke Paternal Grandmother   . Mental retardation Sister   . Colon cancer Neg Hx   . Esophageal cancer Neg Hx   . Pancreatic cancer Neg Hx   . Stomach cancer Neg Hx   . Liver disease Neg Hx     ROS: no fevers or chills, productive cough, hemoptysis, dysphasia, odynophagia, melena, hematochezia, dysuria, hematuria, rash, seizure activity, orthopnea, PND, pedal edema, claudication. Remaining systems are negative.  Physical Exam: Well-developed well-nourished in no acute distress.  Skin is warm and dry.  HEENT is normal.  Neck is supple.  Chest is clear to auscultation with normal expansion.  Cardiovascular exam is regular rate and rhythm.  2/6 systolic murmur left sternal border.  S2 is not diminished. Abdominal exam nontender or distended. No masses palpated. Extremities show no edema. neuro grossly intact  ECG-sinus bradycardia at a rate of 59, inferior infarct, nonspecific ST changes.  Personally reviewed  A/P  1 coronary artery disease-no recurrent chest pain.  Continue aspirin and statin.  2 hypertension-blood pressure controlled.   Continue present medical regimen.  3 hyperlipidemia-continue statin.  4 history of mild aortic stenosis-plan follow-up echocardiogram in 6 months.  5 chronic stage III kidney disease-monitored by primary care.  Kirk Ruths, MD

## 2019-07-05 ENCOUNTER — Other Ambulatory Visit: Payer: Self-pay

## 2019-07-05 ENCOUNTER — Encounter: Payer: Self-pay | Admitting: Cardiology

## 2019-07-05 ENCOUNTER — Ambulatory Visit (INDEPENDENT_AMBULATORY_CARE_PROVIDER_SITE_OTHER): Payer: Medicare Other | Admitting: Cardiology

## 2019-07-05 VITALS — BP 128/64 | HR 58 | Temp 96.9°F | Ht 63.0 in | Wt 144.0 lb

## 2019-07-05 DIAGNOSIS — E7800 Pure hypercholesterolemia, unspecified: Secondary | ICD-10-CM | POA: Diagnosis not present

## 2019-07-05 DIAGNOSIS — I350 Nonrheumatic aortic (valve) stenosis: Secondary | ICD-10-CM

## 2019-07-05 DIAGNOSIS — I10 Essential (primary) hypertension: Secondary | ICD-10-CM

## 2019-07-05 DIAGNOSIS — I2510 Atherosclerotic heart disease of native coronary artery without angina pectoris: Secondary | ICD-10-CM

## 2019-07-05 NOTE — Patient Instructions (Signed)

## 2019-08-14 ENCOUNTER — Other Ambulatory Visit: Payer: Self-pay | Admitting: Internal Medicine

## 2019-08-18 ENCOUNTER — Ambulatory Visit: Payer: Medicare Other

## 2019-08-18 ENCOUNTER — Other Ambulatory Visit: Payer: Self-pay

## 2019-08-18 ENCOUNTER — Other Ambulatory Visit: Payer: Medicare Other

## 2019-08-18 DIAGNOSIS — B20 Human immunodeficiency virus [HIV] disease: Secondary | ICD-10-CM

## 2019-08-18 MED ORDER — FREESTYLE TEST VI STRP: ORAL_STRIP | 3 refills | Status: DC

## 2019-08-19 ENCOUNTER — Encounter: Payer: Self-pay | Admitting: Internal Medicine

## 2019-08-19 LAB — T-HELPER CELL (CD4) - (RCID CLINIC ONLY)
CD4 % Helper T Cell: 46 % (ref 33–65)
CD4 T Cell Abs: 372 /uL — ABNORMAL LOW (ref 400–1790)

## 2019-08-22 ENCOUNTER — Other Ambulatory Visit: Payer: Self-pay | Admitting: Endocrinology

## 2019-08-24 ENCOUNTER — Encounter: Payer: Self-pay | Admitting: Adult Health

## 2019-08-25 LAB — CBC
HCT: 37.4 % — ABNORMAL LOW (ref 38.5–50.0)
Hemoglobin: 12.7 g/dL — ABNORMAL LOW (ref 13.2–17.1)
MCH: 32.3 pg (ref 27.0–33.0)
MCHC: 34 g/dL (ref 32.0–36.0)
MCV: 95.2 fL (ref 80.0–100.0)
MPV: 10.1 fL (ref 7.5–12.5)
Platelets: 157 10*3/uL (ref 140–400)
RBC: 3.93 10*6/uL — ABNORMAL LOW (ref 4.20–5.80)
RDW: 12.9 % (ref 11.0–15.0)
WBC: 4 10*3/uL (ref 3.8–10.8)

## 2019-08-25 LAB — COMPREHENSIVE METABOLIC PANEL
AG Ratio: 1.6 (calc) (ref 1.0–2.5)
ALT: 30 U/L (ref 9–46)
AST: 22 U/L (ref 10–35)
Albumin: 3.9 g/dL (ref 3.6–5.1)
Alkaline phosphatase (APISO): 37 U/L (ref 35–144)
BUN/Creatinine Ratio: 13 (calc) (ref 6–22)
BUN: 23 mg/dL (ref 7–25)
CO2: 25 mmol/L (ref 20–32)
Calcium: 9.2 mg/dL (ref 8.6–10.3)
Chloride: 109 mmol/L (ref 98–110)
Creat: 1.8 mg/dL — ABNORMAL HIGH (ref 0.70–1.18)
Globulin: 2.4 g/dL (calc) (ref 1.9–3.7)
Glucose, Bld: 129 mg/dL — ABNORMAL HIGH (ref 65–99)
Potassium: 4.9 mmol/L (ref 3.5–5.3)
Sodium: 141 mmol/L (ref 135–146)
Total Bilirubin: 0.5 mg/dL (ref 0.2–1.2)
Total Protein: 6.3 g/dL (ref 6.1–8.1)

## 2019-08-25 LAB — RPR: RPR Ser Ql: NONREACTIVE

## 2019-08-25 LAB — HIV-1 RNA QUANT-NO REFLEX-BLD
HIV 1 RNA Quant: <20 copies/mL
HIV-1 RNA Quant, Log: <1.3 Log copies/mL

## 2019-08-28 ENCOUNTER — Other Ambulatory Visit: Payer: Self-pay | Admitting: Adult Health

## 2019-08-30 ENCOUNTER — Other Ambulatory Visit: Payer: Medicare Other

## 2019-08-30 NOTE — Telephone Encounter (Signed)
Ok to refill till then

## 2019-09-01 ENCOUNTER — Encounter: Payer: Medicare Other | Admitting: Internal Medicine

## 2019-09-02 ENCOUNTER — Ambulatory Visit: Payer: Medicare Other | Admitting: Endocrinology

## 2019-09-05 ENCOUNTER — Ambulatory Visit: Payer: Medicare Other | Admitting: Podiatry

## 2019-09-07 ENCOUNTER — Other Ambulatory Visit: Payer: Self-pay

## 2019-09-07 ENCOUNTER — Ambulatory Visit (INDEPENDENT_AMBULATORY_CARE_PROVIDER_SITE_OTHER): Payer: Medicare Other | Admitting: Internal Medicine

## 2019-09-07 DIAGNOSIS — B20 Human immunodeficiency virus [HIV] disease: Secondary | ICD-10-CM

## 2019-09-07 NOTE — Progress Notes (Signed)
Virtual Visit via Telephone Note  I connected with [name] on 09/07/19 at 10:15 AM EST by telephone and verified that I am speaking with the correct person using two identifiers.  Location: Patient: Home Provider: [facility]   I discussed the limitations, risks, security and privacy concerns of performing an evaluation and management service by telephone and the availability of in person appointments. I also discussed with the patient that there may be a patient responsible charge related to this service. The patient expressed understanding and agreed to proceed.   History of Present Illness: I called and spoke with [name] today.  He has not had any problems obtaining, taking or tolerating his HIV regimen.  As usual he never misses a single dose.  He takes his medications in the morning.  He has already received his first Covid vaccine.  He is going a little stir crazy staying at home during the pandemic.  He is feeling well.   Observations/Objective: HIV 1 RNA Quant (copies/mL)  Date Value  08/18/2019 <20 NOT DETECTED  02/17/2019 <20 DETECTED (A)  07/23/2018 <20 NOT DETECTED   HIV-1 RNA Viral Load (no units)  Date Value  07/14/2013 <40  04/13/2013 <40  01/18/2013 <40   CD4 (no units)  Date Value  07/14/2013 514  04/13/2013 498  01/18/2013 522   CD4 T Cell Abs (/uL)  Date Value  08/18/2019 372 (L)  02/17/2019 358 (L)  07/23/2018 300 (L)    Assessment and Plan: His infection remains under excellent, long-term control.  He will continue Pifeltro and Tivicay and follow-up after lab work in 6 months.  Follow Up Instructions: Follow-up after lab work in 6 months   I discussed the assessment and treatment plan with the patient. The patient was provided an opportunity to ask questions and all were answered. The patient agreed with the plan and demonstrated an understanding of the instructions.   The patient was advised to call back or seek an in-person evaluation if the  symptoms worsen or if the condition fails to improve as anticipated.  I provided 14 minutes of non-face-to-face time during this encounter.   Michel Bickers, MD

## 2019-09-23 ENCOUNTER — Encounter: Payer: Self-pay | Admitting: Adult Health

## 2019-09-23 ENCOUNTER — Other Ambulatory Visit: Payer: Self-pay | Admitting: Adult Health

## 2019-09-26 ENCOUNTER — Other Ambulatory Visit: Payer: Self-pay | Admitting: Physician Assistant

## 2019-09-26 MED ORDER — FUROSEMIDE 20 MG PO TABS: 40.0000 mg | ORAL_TABLET | Freq: Every day | ORAL | 0 refills | Status: DC

## 2019-09-27 ENCOUNTER — Other Ambulatory Visit (INDEPENDENT_AMBULATORY_CARE_PROVIDER_SITE_OTHER): Payer: Medicare Other

## 2019-09-27 ENCOUNTER — Other Ambulatory Visit: Payer: Self-pay

## 2019-09-27 ENCOUNTER — Other Ambulatory Visit: Payer: Self-pay | Admitting: Internal Medicine

## 2019-09-27 DIAGNOSIS — E1165 Type 2 diabetes mellitus with hyperglycemia: Secondary | ICD-10-CM

## 2019-09-27 DIAGNOSIS — B029 Zoster without complications: Secondary | ICD-10-CM

## 2019-09-27 LAB — MICROALBUMIN / CREATININE URINE RATIO
Creatinine,U: 92.3 mg/dL
Microalb Creat Ratio: 8.8 mg/g (ref 0.0–30.0)
Microalb, Ur: 8.1 mg/dL — ABNORMAL HIGH (ref 0.0–1.9)

## 2019-09-27 LAB — BASIC METABOLIC PANEL
BUN: 26 mg/dL — ABNORMAL HIGH (ref 6–23)
CO2: 27 mEq/L (ref 19–32)
Calcium: 9.2 mg/dL (ref 8.4–10.5)
Chloride: 105 mEq/L (ref 96–112)
Creatinine, Ser: 1.77 mg/dL — ABNORMAL HIGH (ref 0.40–1.50)
GFR: 38.07 mL/min — ABNORMAL LOW (ref >60.00)
Glucose, Bld: 116 mg/dL — ABNORMAL HIGH (ref 70–99)
Potassium: 4.9 mEq/L (ref 3.5–5.1)
Sodium: 137 mEq/L (ref 135–145)

## 2019-09-27 LAB — HEMOGLOBIN A1C: Hgb A1c MFr Bld: 7.3 % — ABNORMAL HIGH (ref 4.6–6.5)

## 2019-10-01 ENCOUNTER — Other Ambulatory Visit: Payer: Self-pay | Admitting: Adult Health

## 2019-10-03 ENCOUNTER — Other Ambulatory Visit: Payer: Self-pay

## 2019-10-03 ENCOUNTER — Ambulatory Visit: Payer: Medicare Other | Admitting: Endocrinology

## 2019-10-03 ENCOUNTER — Other Ambulatory Visit: Payer: Self-pay | Admitting: Adult Health

## 2019-10-03 MED ORDER — FREESTYLE TEST VI STRP: ORAL_STRIP | 3 refills | Status: DC

## 2019-10-05 NOTE — Telephone Encounter (Signed)
Sent to the pharmacy by e-scribe. 

## 2019-10-11 ENCOUNTER — Other Ambulatory Visit: Payer: Self-pay | Admitting: Cardiology

## 2019-10-12 ENCOUNTER — Other Ambulatory Visit: Payer: Self-pay

## 2019-10-12 MED ORDER — PANTOPRAZOLE SODIUM 40 MG PO TBEC: DELAYED_RELEASE_TABLET | ORAL | 11 refills | Status: AC

## 2019-10-19 ENCOUNTER — Ambulatory Visit (INDEPENDENT_AMBULATORY_CARE_PROVIDER_SITE_OTHER): Payer: Medicare Other | Admitting: Gastroenterology

## 2019-10-19 ENCOUNTER — Other Ambulatory Visit: Payer: Self-pay

## 2019-10-19 ENCOUNTER — Encounter: Payer: Self-pay | Admitting: Gastroenterology

## 2019-10-19 VITALS — BP 100/68 | HR 68 | Temp 97.7°F | Ht 62.5 in | Wt 145.4 lb

## 2019-10-19 DIAGNOSIS — Z86010 Personal history of colonic polyps: Secondary | ICD-10-CM

## 2019-10-19 MED ORDER — NA SULFATE-K SULFATE-MG SULF 17.5-3.13-1.6 GM/177ML PO SOLN: 1.0000 | Freq: Once | ORAL | 0 refills | Status: AC

## 2019-10-19 NOTE — Progress Notes (Signed)
Review of pertinent gastrointestinal problems: 1.  Personal history of adenomatous colon polyp.  Colonoscopy December 2013 Dr. Ardis Hughs for minor hematochezia.  He had a single subcentimeter adenoma removed.  He also had medium to large hemorrhoids which were felt to be the source of his bleeding.     HPI: This is a very pleasant 74 year old man   I last saw him April 2019 so about 2 years ago to discuss his personal history of adenomatous colon polyps.  He had just recovered from an acute MI and was on aspirin Plavix and so I recommended he return to see me in April 2020 to reconsider surveillance colonoscopy at that point.  This is the first time seeing him since.  It looks like he recovered quite well, cardiology note December 2020 was reviewed.  He is off of Plavix now.  Blood work February 2021 shows creatinine 1.8, HIV level undetected, hemoglobin 12.7, normal LFTs  He does have rare intermittent loose stools.  These are very bothersome to him.  He never sees blood in his stool.  He has no significant abdominal pains.  Colon cancer does not run in his family.  He thinks he has gained about 10 pounds in the past year   ROS: complete GI ROS as described in HPI, all other review negative.  Constitutional:  No unintentional weight loss   Past Medical History:  Diagnosis Date  . Absolute anemia 05/28/2014  . Aortic stenosis 09/17/2015  . Arthritis   . Ascites 11/18/2013  . BELLS PALSY 07/19/2010   Qualifier: Diagnosis of  By: [name], [name]    . CAD (coronary artery disease)    a. s/p CABG in 2003 b. s/p PCI to SVG-PDA in 07/2015 c. 11/2016: cath showing severe native CAD with patent LIMA-LAD and SVG-D1 with 80% stenosis of SVG-OM1-OM2. Initially medical management was recommended --> presented with recurrent angina --> s/p Synergy DES to proximal body of SVG-OM1-OM2, POBA to distal graft.   Marland Kitchen CAD- S/P PCI SVG-OM1 12/01/16 05/12/2006   Qualifier: Diagnosis of  By: [name], [name]    .  Carotid bruit 07/10/2015  . Chest pain 11/22/2016  . Chronic kidney disease   . Chronic renal insufficiency, stage III (moderate) 12/02/2016  . Constipation 05/28/2014  . COPD 07/20/2008   Qualifier: Diagnosis of  By: [name], [name]   . DEPRESSION 09/03/2006   Qualifier: Diagnosis of  By: [name], [name]    . Dermatitis 11/27/2012  . Diabetes mellitus   . Diarrhea 11/20/2013  . DM (diabetes mellitus), type 2 with ophthalmic complications ([place]) 0000000   Qualifier: Diagnosis of  By: [name], [name]    . Dry eye syndrome 12/20/2010  . Epicondylitis 06/05/2013   right  . Erectile dysfunction 01/01/2012  . Essential hypertension 05/12/2006   Qualifier: Diagnosis of  By: [name], [name]    . GENITAL HERPES 05/03/2009   Qualifier: Diagnosis of  By: [name], [name]    . GERD 09/03/2006   Qualifier: Diagnosis of  By: [name], [name]    . GERD (gastroesophageal reflux disease)   . HEARING LOSS, SENSORINEURAL 05/12/2006   Qualifier: Diagnosis of  By: [name], [name]    . HEMATOCHEZIA 04/18/2008   Annotation: 9/09 during bout of constipation Qualifier: Diagnosis of  By: [name], [name]    . HIP PAIN, BILATERAL 07/17/2008   Qualifier: Diagnosis of  By: [name], [name]   . History of depression   . History of  kidney stones   . HIV (human immunodeficiency virus infection) ([place]) 1991   on meds since initial dx.   Marland Kitchen HLD (hyperlipidemia) 11/19/2013  . Human immunodeficiency virus (HIV) disease ([place]) 05/12/2006   Qualifier: Diagnosis of  By: [name], [name]    . Hx of CABG 09/03/2006   Annotation: 2003 Qualifier: Diagnosis of  By: [name], [name]    . Hyperkalemia 03/23/2014  . Hyperlipidemia   . Hypertension   . Hyponatremia 03/23/2014  . INGUINAL LYMPHADENOPATHY, RIGHT 04/03/2009   Qualifier: Diagnosis of  By: [name], [name]    . Keratoma 01/24/2015  . KNEE PAIN, BILATERAL 07/17/2008   Qualifier: Diagnosis of  By: [name], [name]   . Lesion of breast 07/21/2015  . Lipodystrophy  12/20/2010  . Memory loss 09/18/2008   Qualifier: Diagnosis of  By: [name], [name]   . Metatarsal deformity 01/24/2015  . Nasal abscess 12/04/2015  . Night sweats 07/06/2012  . Nocturia 03/06/2013  . NSTEMI (non-ST elevated myocardial infarction) ([place]) 11/29/2016  . Pain in joint, ankle and foot 01/19/2015  . Pancreatitis 11/2013   attributed to HIV meds.   . Pedal edema 05/21/2014  . PERIPHERAL VASCULAR DISEASE 07/20/2008   Qualifier: Diagnosis of  By: [name], [name]   . Posterior cervical lymphadenopathy 03/30/2014  . Protein-calorie malnutrition, severe ([place]) 03/23/2014  . SHINGLES, HX OF 05/03/2009   Annotation: R leg Qualifier: Diagnosis of  By: [name], [name]    . STEMI (ST elevation myocardial infarction) ([place]) 07/29/2015  . Unstable angina ([place]) 11/24/2016  . WEIGHT LOSS, ABNORMAL 04/03/2009   Qualifier: Diagnosis of  By: [name], [name]      Past Surgical History:  Procedure Laterality Date  . CARDIAC CATHETERIZATION N/A 07/29/2015   Procedure: Left Heart Cath and Coronary Angiography;  Surgeon: [name], MD;  Location: [facility];  Service: Cardiovascular;  Laterality: N/A;  . CARDIAC CATHETERIZATION N/A 07/29/2015   Procedure: Coronary Stent Intervention;  Surgeon: [name], MD;  Location: [facility];  Service: Cardiovascular;  Laterality: N/A;  . CORONARY ARTERY BYPASS GRAFT  05/2002  . CORONARY STENT INTERVENTION N/A 12/01/2016   Procedure: Coronary Stent Intervention;  Surgeon: [name], MD;  Location: [facility];  Service: Cardiovascular;  Laterality: N/A;  . CORONARY STENT INTERVENTION N/A 10/21/2017   Procedure: CORONARY STENT INTERVENTION;  Surgeon: Jettie Booze, MD;  Location: [facility];  Service: Cardiovascular;  Laterality: N/A;  . LEFT HEART CATH AND CORS/GRAFTS ANGIOGRAPHY N/A 11/24/2016   Procedure: Left Heart Cath and Cors/Grafts Angiography;  Surgeon: Nelva Bush, MD;  Location: [facility];   Service: Cardiovascular;  Laterality: N/A;  . LEFT HEART CATH AND CORS/GRAFTS ANGIOGRAPHY N/A 12/01/2016   Procedure: Left Heart Cath and Cors/Grafts Angiography;  Surgeon: [name], MD;  Location: [facility];  Service: Cardiovascular;  Laterality: N/A;  . LEFT HEART CATH AND CORS/GRAFTS ANGIOGRAPHY N/A 10/21/2017   Procedure: LEFT HEART CATH AND CORS/GRAFTS ANGIOGRAPHY;  Surgeon: Jettie Booze, MD;  Location: [facility];  Service: Cardiovascular;  Laterality: N/A;  . VASECTOMY      Current Outpatient Medications  Medication Sig Dispense Refill  . acetaminophen (TYLENOL) 325 MG tablet Take 2 tablets (650 mg total) by mouth every 4 (four) hours as needed for headache or mild pain.    Marland Kitchen aspirin 81 MG chewable tablet Chew 81 mg by mouth daily.    Marland Kitchen  atorvastatin (LIPITOR) 20 MG tablet TAKE 1 TABLET(20 MG) BY MOUTH DAILY 90 tablet 1  . b complex vitamins tablet Take 1 tablet by mouth daily. 30 tablet 11  . Cholecalciferol 2000 units CAPS Take 1 capsule (2,000 Units total) by mouth daily. 30 each 11  . fenofibrate 54 MG tablet TAKE 1 TABLET(54 MG) BY MOUTH DAILY 90 tablet 2  . furosemide (LASIX) 20 MG tablet Take 2 tablets (40 mg total) by mouth daily. 180 tablet 0  . glucose blood (FREESTYLE TEST STRIPS) test strip USE TO CHECK BLOOD SUGAR THREE TIMES DAILY 300 each 3  . isosorbide mononitrate (IMDUR) 30 MG 24 hr tablet TAKE 1 TABLET BY MOUTH DAILY 90 tablet 2  . Lancets (FREESTYLE) lancets Use as directed three times a day to check blood sugar.  DX E11.9 100 each 5  . lisinopril (PRINIVIL,ZESTRIL) 5 MG tablet TAKE 1 TABLET(5 MG) BY MOUTH DAILY 90 tablet 3  . metFORMIN (GLUCOPHAGE-XR) 500 MG 24 hr tablet TAKE 1 TABLET(500 MG) BY MOUTH DAILY WITH SUPPER 90 tablet 1  . metolazone (ZAROXOLYN) 2.5 MG tablet metolazone 2.5 mg tablet    . metoprolol tartrate (LOPRESSOR) 25 MG tablet Take 0.5 tablets (12.5 mg total) by mouth 2 (two) times daily. NEED APPOINTMENT FOR FUTURE  REFILL 60 tablet 1  . nitroGLYCERIN (NITROSTAT) 0.4 MG SL tablet PLACE 1 TABLET UNDER THE TONGUE EVERY 5 MINUTES AS NEEDED FOR CHEST PAIN 25 tablet 3  . pantoprazole (PROTONIX) 40 MG tablet TAKE 1 TABLET(40 MG) BY MOUTH DAILY 30 tablet 11  . PIFELTRO 100 MG TABS tablet TAKE 1 TABLET(100 MG) BY MOUTH DAILY 30 tablet 5  . pioglitazone (ACTOS) 15 MG tablet TAKE 1 TABLET(15 MG) BY MOUTH DAILY 30 tablet 3  . potassium chloride SA (KLOR-CON) 20 MEQ tablet TAKE 1 TABLET(20 MEQ) BY MOUTH THREE TIMES DAILY 270 tablet 1  . Probiotic Product (PROBIOTIC DAILY) CAPS Take 1 by mouth daily 30 capsule 11  . repaglinide (PRANDIN) 2 MG tablet TAKE 2 TABLETS BY MOUTH BEFORE BREAKFAST, 1 TABLET BEFORE LUNCH AND, 2 TABLETS BEFORE SUPPER 450 tablet 1  . sodium bicarbonate 650 MG tablet Take 650 mg by mouth 3 (three) times daily.    . tamsulosin (FLOMAX) 0.4 MG CAPS capsule TK 1 C PO BID  11  . TIVICAY 50 MG tablet TAKE 1 TABLET BY MOUTH DAILY. STOP JULUCA AND PREZCOBIX 30 tablet 5  . triamcinolone cream (KENALOG) 0.1 % apply twice daily as needed to dry, itchy skin  2  . valACYclovir (VALTREX) 500 MG tablet TAKE 1 TABLET BY MOUTH DAILY 30 tablet 5  . VASCEPA 1 g capsule TAKE 2 CAPSULES(2 GRAMS) BY MOUTH TWICE DAILY 120 capsule 2   No current facility-administered medications for this visit.    Allergies as of 10/19/2019  . (No Known Allergies)    Family History  Problem Relation Age of Onset  . Hyperlipidemia Mother   . Diabetes Mother        paternal grandparents/1 brother  . Hyperlipidemia Father   . Hypertension Father        paternal grandmother/3 brothers/1 sister  . Arthritis Other        mother/father/paternal grandparents  . Breast cancer Maternal Aunt        paternal aunt  . Lung cancer Maternal Aunt   . Heart disease Other        parents/maternal grandparents/ 2 brothers  . Stroke Paternal Grandmother   . Mental retardation Sister   .  Colon cancer Neg Hx   . Esophageal cancer Neg Hx   .  Pancreatic cancer Neg Hx   . Stomach cancer Neg Hx   . Liver disease Neg Hx     Social History   Socioeconomic History  . Marital status: Divorced    Spouse name: Not on file  . Number of children: 4  . Years of education: Not on file  . Highest education level: Not on file  Occupational History  . 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   Tobacco Use  . Smoking status: Never Smoker  . Smokeless tobacco: Never Used  Substance and Sexual Activity  . Alcohol use: Yes    Alcohol/week: 0.0 standard drinks    Comment: rare  . Drug use: No  . Sexual activity: Not Currently    Comment: declined condoms  Other Topics Concern  . Not on file  Social History Narrative   Lives alone.  Supportive friends and family.  His HIV Dx is not a secret.    Social Determinants of Health   Financial Resource Strain:   . Difficulty of Paying Living Expenses:   Food Insecurity:   . Worried About Charity fundraiser in the Last Year:   . Arboriculturist in the Last Year:   Transportation Needs:   . Film/video editor (Medical):   Marland Kitchen Lack of Transportation (Non-Medical):   Physical Activity:   . Days of Exercise per Week:   . Minutes of Exercise per Session:   Stress:   . Feeling of Stress :   Social Connections:   . Frequency of Communication with Friends and Family:   . Frequency of Social Gatherings with Friends and Family:   . Attends Religious Services:   . Active Member of Clubs or Organizations:   . Attends Archivist Meetings:   Marland Kitchen Marital Status:   Intimate Partner Violence:   . Fear of Current or Ex-Partner:   . Emotionally Abused:   Marland Kitchen Physically Abused:   . Sexually Abused:      Physical Exam: BP 100/68 (BP Location: Left Arm, Patient Position: Sitting, Cuff Size: Normal)   Pulse 68   Temp 97.7 F (36.5 C)   Ht 5' 2.5" (1.588 m) Comment: height measured without shoes  Wt 145 lb 6 oz (65.9 kg)   BMI  26.17 kg/m  Constitutional: generally well-appearing Psychiatric: alert and oriented x3 Abdomen: soft, nontender, nondistended, no obvious ascites, no peritoneal signs, normal bowel sounds No peripheral edema noted in lower extremities  Assessment and plan: 74 y.o. male with personal history of adenomatous colon polyps  His index colonoscopy was 8 years ago.  I recommended repeat colonoscopy now for surveillance and I see no reason for any further blood tests or imaging studies prior to then.  Please see the "Patient Instructions" section for addition details about the plan.  Owens Loffler, MD [facility] 10/19/2019, 9:57 AM   Total time on date of encounter was 25 minutes (this included time spent preparing to see the patient reviewing records; obtaining and/or reviewing separately obtained history; performing a medically appropriate exam and/or evaluation; counseling and educating the patient and family if present; ordering medications, tests or procedures if applicable; and documenting clinical information in the health record).

## 2019-10-19 NOTE — Patient Instructions (Signed)
If you are age 72 or older, your body mass index should be between 23-30. Your Body mass index is 26.17 kg/m. If this is out of the aforementioned range listed, please consider follow up with your Primary Care Provider.  If you are age 37 or younger, your body mass index should be between 19-25. Your Body mass index is 26.17 kg/m. If this is out of the aformentioned range listed, please consider follow up with your Primary Care Provider.   You have been scheduled for a colonoscopy. Please follow written instructions given to you at your visit today.  Please pick up your prep supplies at the pharmacy within the next 1-3 days. If you use inhalers (even only as needed), please bring them with you on the day of your procedure.  You colonoscopy prep has been sent to the pharmacy for pick up  Due to recent changes in healthcare laws, you may see the results of your imaging and laboratory studies on MyChart before your provider has had a chance to review them.  We understand that in some cases there may be results that are confusing or concerning to you. Not all laboratory results come back in the same time frame and the provider may be waiting for multiple results in order to interpret others.  Please give [place] 48 hours in order for your provider to thoroughly review all the results before contacting the office for clarification of your results.

## 2019-10-21 ENCOUNTER — Ambulatory Visit: Payer: Medicare Other | Admitting: Podiatry

## 2019-10-26 DIAGNOSIS — E119 Type 2 diabetes mellitus without complications: Secondary | ICD-10-CM | POA: Diagnosis not present

## 2019-10-26 LAB — HM DIABETES EYE EXAM

## 2019-10-27 ENCOUNTER — Encounter: Payer: Self-pay | Admitting: Adult Health

## 2019-10-27 NOTE — Telephone Encounter (Signed)
noted 

## 2019-11-10 ENCOUNTER — Other Ambulatory Visit: Payer: Self-pay | Admitting: Physician Assistant

## 2019-11-10 ENCOUNTER — Other Ambulatory Visit: Payer: Self-pay | Admitting: Adult Health

## 2019-11-11 NOTE — Telephone Encounter (Signed)
SENT TO THE PHARMACY ON 10/05/2019 FOR 90 DAYS.  REQUEST IS TOO EARLY.

## 2019-11-18 ENCOUNTER — Other Ambulatory Visit: Payer: Self-pay | Admitting: Endocrinology

## 2019-11-18 MED ORDER — FREESTYLE TEST VI STRP: ORAL_STRIP | 3 refills | Status: AC

## 2019-11-25 ENCOUNTER — Encounter: Payer: Medicare Other | Admitting: Gastroenterology

## 2019-11-29 ENCOUNTER — Encounter: Payer: Self-pay | Admitting: Adult Health

## 2019-11-29 MED ORDER — POTASSIUM CHLORIDE CRYS ER 20 MEQ PO TBCR: EXTENDED_RELEASE_TABLET | ORAL | 1 refills | Status: DC

## 2019-11-30 DIAGNOSIS — N183 Chronic kidney disease, stage 3 unspecified: Secondary | ICD-10-CM | POA: Diagnosis not present

## 2019-12-02 ENCOUNTER — Encounter: Payer: Self-pay | Admitting: Gastroenterology

## 2019-12-02 ENCOUNTER — Ambulatory Visit (AMBULATORY_SURGERY_CENTER): Payer: Medicare Other | Admitting: Gastroenterology

## 2019-12-02 ENCOUNTER — Other Ambulatory Visit: Payer: Self-pay

## 2019-12-02 VITALS — BP 137/66 | HR 60 | Temp 97.1°F | Resp 11 | Ht 62.0 in | Wt 145.0 lb

## 2019-12-02 DIAGNOSIS — J449 Chronic obstructive pulmonary disease, unspecified: Secondary | ICD-10-CM | POA: Diagnosis not present

## 2019-12-02 DIAGNOSIS — Z86010 Personal history of colonic polyps: Secondary | ICD-10-CM

## 2019-12-02 DIAGNOSIS — N183 Chronic kidney disease, stage 3 unspecified: Secondary | ICD-10-CM | POA: Diagnosis not present

## 2019-12-02 DIAGNOSIS — I2510 Atherosclerotic heart disease of native coronary artery without angina pectoris: Secondary | ICD-10-CM | POA: Diagnosis not present

## 2019-12-02 DIAGNOSIS — I252 Old myocardial infarction: Secondary | ICD-10-CM | POA: Diagnosis not present

## 2019-12-02 HISTORY — PX: COLONOSCOPY: SHX174

## 2019-12-02 MED ORDER — SODIUM CHLORIDE 0.9 % IV SOLN: 500.0000 mL | Freq: Once | INTRAVENOUS | Status: DC

## 2019-12-02 MED ORDER — DEXTROSE 5 % IV SOLN
INTRAVENOUS | Status: DC
Start: 2019-12-02 — End: 2020-05-29

## 2019-12-02 NOTE — Progress Notes (Signed)
Report given to PACU, vss 

## 2019-12-02 NOTE — Progress Notes (Signed)
Pt's states no medical or surgical changes since previsit or office visit.  Temp  JB Vitals  DT  cbg 65, Dr Ardis Hughs states to run D5W wide open then recheck cbg in 15 minutes.  cbg 124 after 15 mins and D5W stopped.

## 2019-12-02 NOTE — Patient Instructions (Signed)
YOU HAD AN ENDOSCOPIC PROCEDURE TODAY AT THE [facility]:   Refer to the procedure report that was given to you for any specific questions about what was found during the examination.  If the procedure report does not answer your questions, please call your gastroenterologist to clarify.  If you requested that your care partner not be given the details of your procedure findings, then the procedure report has been included in a sealed envelope for you to review at your convenience later.  YOU SHOULD EXPECT: Some feelings of bloating in the abdomen. Passage of more gas than usual.  Walking can help get rid of the air that was put into your GI tract during the procedure and reduce the bloating. If you had a lower endoscopy (such as a colonoscopy or flexible sigmoidoscopy) you may notice spotting of blood in your stool or on the toilet paper. If you underwent a bowel prep for your procedure, you may not have a normal bowel movement for a few days.  Please Note:  You might notice some irritation and congestion in your nose or some drainage.  This is from the oxygen used during your procedure.  There is no need for concern and it should clear up in a day or so.  SYMPTOMS TO REPORT IMMEDIATELY:   Following lower endoscopy (colonoscopy or flexible sigmoidoscopy):  Excessive amounts of blood in the stool  Significant tenderness or worsening of abdominal pains  Swelling of the abdomen that is new, acute  Fever of 100F or higher   For urgent or emergent issues, a gastroenterologist can be reached at any hour by calling [phone]. Do not use MyChart messaging for urgent concerns.    DIET:  We do recommend a small meal at first, but then you may proceed to your regular diet.  Drink plenty of fluids but you should avoid alcoholic beverages for 24 hours.  MEDICATIONS: Continue present medications.  Please see handouts given to you by your recovery nurse.  ACTIVITY:  You should plan to  take it easy for the rest of today and you should NOT DRIVE or use heavy machinery until tomorrow (because of the sedation medicines used during the test).    FOLLOW UP: Our staff will call the number listed on your records 48-72 hours following your procedure to check on you and address any questions or concerns that you may have regarding the information given to you following your procedure. If we do not reach you, we will leave a message.  We will attempt to reach you two times.  During this call, we will ask if you have developed any symptoms of COVID 19. If you develop any symptoms (ie: fever, flu-like symptoms, shortness of breath, cough etc.) before then, please call [phone].  If you test positive for Covid 19 in the 2 weeks post procedure, please call and report this information to us.    If any biopsies were taken you will be contacted by phone or by letter within the next 1-3 weeks.  Please call us at [phone] if you have not heard about the biopsies in 3 weeks.   Thank you for allowing us to provide for your healthcare needs today.   SIGNATURES/CONFIDENTIALITY: You and/or your care partner have signed paperwork which will be entered into your electronic medical record.  These signatures attest to the fact that that the information above on your After Visit Summary has been reviewed and is understood.  Full responsibility of the   confidentiality of this discharge information lies with you and/or your care-partner. 

## 2019-12-02 NOTE — Op Note (Signed)
[facility] Patient Name: [name] Procedure Date: 12/02/2019 2:00 PM MRN: [id number] Endoscopist: Milus Banister , MD Age: 74 Referring MD:  Date of Birth: 1945-11-26 Gender: Male Account #: [id number] Procedure:                Colonoscopy Indications:              High risk colon cancer surveillance: Personal                            history of colonic polyps; Colonoscopy December                            2013 Dr. Ardis Hughs for minor hematochezia. He had a                            single subcentimeter adenoma removed. He also had                            medium to large hemorrhoids which were felt to be                            the source of his bleeding. Medicines:                Monitored Anesthesia Care Procedure:                Pre-Anesthesia Assessment:                           - Prior to the procedure, a History and Physical                            was performed, and patient medications and                            allergies were reviewed. The patient's tolerance of                            previous anesthesia was also reviewed. The risks                            and benefits of the procedure and the sedation                            options and risks were discussed with the patient.                            All questions were answered, and informed consent                            was obtained. Prior Anticoagulants: The patient has                            taken no previous anticoagulant or antiplatelet  agents. ASA Grade Assessment: II - A patient with                            mild systemic disease. After reviewing the risks                            and benefits, the patient was deemed in                            satisfactory condition to undergo the procedure.                           After obtaining informed consent, the colonoscope                            was passed under direct vision.  Throughout the                            procedure, the patient's blood pressure, pulse, and                            oxygen saturations were monitored continuously. The                            Colonoscope was introduced through the anus and                            advanced to the the cecum, identified by                            appendiceal orifice and ileocecal valve. The                            colonoscopy was performed without difficulty. The                            patient tolerated the procedure well. The quality                            of the bowel preparation was good. The ileocecal                            valve, appendiceal orifice, and rectum were                            photographed. Scope In: 2:08:34 PM Scope Out: 2:17:20 PM Scope Withdrawal Time: 0 hours 4 minutes 43 seconds  Total Procedure Duration: 0 hours 8 minutes 46 seconds  Findings:                 External and internal hemorrhoids were found. The                            hemorrhoids were small.  The exam was otherwise without abnormality on                            direct and retroflexion views. Complications:            No immediate complications. Estimated blood loss:                            None. Estimated Blood Loss:     Estimated blood loss: none. Impression:               - External and internal hemorrhoids.                           - The examination was otherwise normal on direct                            and retroflexion views.                           - No polyps or cancers. Recommendation:           - Patient has a contact number available for                            emergencies. The signs and symptoms of potential                            delayed complications were discussed with the                            patient. Return to normal activities tomorrow.                            Written discharge instructions were provided to the                             patient.                           - Resume previous diet.                           - Continue present medications.                           - You do not need any further colon cancer                            screening tests (including stool testing). These                            types of tests generally stop around age 40-80. Milus Banister, MD 12/02/2019 2:20:18 PM This report has been signed electronically.

## 2019-12-03 ENCOUNTER — Encounter: Payer: Self-pay | Admitting: Adult Health

## 2019-12-05 DIAGNOSIS — I129 Hypertensive chronic kidney disease with stage 1 through stage 4 chronic kidney disease, or unspecified chronic kidney disease: Secondary | ICD-10-CM | POA: Diagnosis not present

## 2019-12-05 DIAGNOSIS — N2581 Secondary hyperparathyroidism of renal origin: Secondary | ICD-10-CM | POA: Diagnosis not present

## 2019-12-05 DIAGNOSIS — D631 Anemia in chronic kidney disease: Secondary | ICD-10-CM | POA: Diagnosis not present

## 2019-12-05 DIAGNOSIS — N1831 Chronic kidney disease, stage 3a: Secondary | ICD-10-CM | POA: Diagnosis not present

## 2019-12-06 ENCOUNTER — Telehealth: Payer: Self-pay

## 2019-12-06 ENCOUNTER — Other Ambulatory Visit: Payer: Self-pay

## 2019-12-06 NOTE — Telephone Encounter (Signed)
  Follow up Call-  Call back number 12/02/2019  Post procedure Call Back phone  # [phone]  Permission to leave phone message Yes  Some recent data might be hidden     Patient questions:  Do you have a fever, pain , or abdominal swelling? No. Pain Score  0 *  Have you tolerated food without any problems? Yes.    Have you been able to return to your normal activities? Yes.    Do you have any questions about your discharge instructions: Diet   No. Medications  No. Follow up visit  No.  Do you have questions or concerns about your Care? No.  Actions: * If pain score is 4 or above: No action needed, pain <4.  Have you developed a fever since your procedure? No 2.   Have you had an respiratory symptoms (SOB or cough) since your procedure? No  3.   Have you tested positive for COVID 19 since your procedure No  4.   Have you had any family members/close contacts diagnosed with the COVID 19 since your procedure?  No  If yes to any of these questions please route to Joylene [name], RN and Erenest Rasher, RN

## 2019-12-08 ENCOUNTER — Other Ambulatory Visit: Payer: Self-pay

## 2019-12-08 ENCOUNTER — Encounter: Payer: Self-pay | Admitting: Adult Health

## 2019-12-08 ENCOUNTER — Ambulatory Visit (INDEPENDENT_AMBULATORY_CARE_PROVIDER_SITE_OTHER): Payer: Medicare Other | Admitting: Adult Health

## 2019-12-08 VITALS — BP 110/60 | Temp 98.3°F | Wt 140.0 lb

## 2019-12-08 DIAGNOSIS — N3281 Overactive bladder: Secondary | ICD-10-CM | POA: Diagnosis not present

## 2019-12-08 LAB — POCT URINALYSIS DIPSTICK
Bilirubin, UA: NEGATIVE
Blood, UA: NEGATIVE
Glucose, UA: NEGATIVE
Ketones, UA: NEGATIVE
Leukocytes, UA: NEGATIVE
Nitrite, UA: NEGATIVE
Protein, UA: NEGATIVE
Spec Grav, UA: 1.02 (ref 1.010–1.025)
Urobilinogen, UA: 0.2 E.U./dL
pH, UA: 6 (ref 5.0–8.0)

## 2019-12-08 NOTE — Progress Notes (Signed)
Subjective:    Patient ID: [name], male    DOB: [birth year], 74 y.o.   MRN: [id number]  HPI 74 year old male who  has a past medical history of Absolute anemia (05/28/2014), Aortic stenosis (09/17/2015), Arthritis, Ascites (11/18/2013), BELLS PALSY (07/19/2010), CAD (coronary artery disease), CAD- S/P PCI SVG-OM1 12/01/16 (05/12/2006), Carotid bruit (07/10/2015), Chest pain (11/22/2016), Chronic kidney disease, Chronic renal insufficiency, stage III (moderate) (12/02/2016), Constipation (05/28/2014), COPD (07/20/2008), DEPRESSION (09/03/2006), Dermatitis (11/27/2012), Diabetes mellitus, Diarrhea (11/20/2013), DM (diabetes mellitus), type 2 with ophthalmic complications ([place]) (0000000), Dry eye syndrome (12/20/2010), Epicondylitis (06/05/2013), Erectile dysfunction (01/01/2012), Essential hypertension (05/12/2006), GENITAL HERPES (05/03/2009), GERD (09/03/2006), GERD (gastroesophageal reflux disease), HEARING LOSS, SENSORINEURAL (05/12/2006), HEMATOCHEZIA (04/18/2008), HIP PAIN, BILATERAL (07/17/2008), History of depression, History of kidney stones, HIV (human immunodeficiency virus infection) ([place]) (1991), HLD (hyperlipidemia) (11/19/2013), Human immunodeficiency virus (HIV) disease ([place]) (05/12/2006), CABG (09/03/2006), Hyperkalemia (03/23/2014), Hyperlipidemia, Hypertension, Hyponatremia (03/23/2014), INGUINAL LYMPHADENOPATHY, RIGHT (04/03/2009), Keratoma (01/24/2015), KNEE PAIN, BILATERAL (07/17/2008), Lesion of breast (07/21/2015), Lipodystrophy (12/20/2010), Memory loss (09/18/2008), Metatarsal deformity (01/24/2015), Nasal abscess (12/04/2015), Night sweats (07/06/2012), Nocturia (03/06/2013), NSTEMI (non-ST elevated myocardial infarction) ([place]) (11/29/2016), Pain in joint, ankle and foot (01/19/2015), Pancreatitis (11/2013), Pedal edema (05/21/2014), PERIPHERAL VASCULAR DISEASE (07/20/2008), Posterior cervical lymphadenopathy (03/30/2014), Protein-calorie malnutrition, severe ([place]) (03/23/2014), SHINGLES, HX OF (05/03/2009), STEMI  (ST elevation myocardial infarction) ([place]) (07/29/2015), Unstable angina ([place]) (11/24/2016), and WEIGHT LOSS, ABNORMAL (04/03/2009).  He presents to the clinic today for an acute issue of concern of UTI.  His symptoms started 4 days ago. His symptoms include that of dysuria ( has resolved), frequency, urgency, discolored urine, and incontinence.   He is trying to stay hydrated.    Review of Systems See HPI   Past Medical History:  Diagnosis Date  . Absolute anemia 05/28/2014  . Aortic stenosis 09/17/2015  . Arthritis   . Ascites 11/18/2013  . BELLS PALSY 07/19/2010   Qualifier: Diagnosis of  By: [name], [name]    . CAD (coronary artery disease)    a. s/p CABG in 2003 b. s/p PCI to SVG-PDA in 07/2015 c. 11/2016: cath showing severe native CAD with patent LIMA-LAD and SVG-D1 with 80% stenosis of SVG-OM1-OM2. Initially medical management was recommended --> presented with recurrent angina --> s/p Synergy DES to proximal body of SVG-OM1-OM2, POBA to distal graft.   Marland Kitchen CAD- S/P PCI SVG-OM1 12/01/16 05/12/2006   Qualifier: Diagnosis of  By: [name], [name]    . Carotid bruit 07/10/2015  . Chest pain 11/22/2016  . Chronic kidney disease   . Chronic renal insufficiency, stage III (moderate) 12/02/2016  . Constipation 05/28/2014  . COPD 07/20/2008   Qualifier: Diagnosis of  By: [name], [name]   . DEPRESSION 09/03/2006   Qualifier: Diagnosis of  By: [name], [name]    . Dermatitis 11/27/2012  . Diabetes mellitus   . Diarrhea 11/20/2013  . DM (diabetes mellitus), type 2 with ophthalmic complications ([place]) 0000000   Qualifier: Diagnosis of  By: [name], [name]    . Dry eye syndrome 12/20/2010  . Epicondylitis 06/05/2013   right  . Erectile dysfunction 01/01/2012  . Essential hypertension 05/12/2006   Qualifier: Diagnosis of  By: [name], [name]    . GENITAL HERPES 05/03/2009   Qualifier: Diagnosis of  By: [name], [name]    . GERD 09/03/2006   Qualifier: Diagnosis of  By: [name],  [name]    . GERD (gastroesophageal reflux disease)   . HEARING LOSS, SENSORINEURAL 05/12/2006  Qualifier: Diagnosis of  By: [name], [name]    . HEMATOCHEZIA 04/18/2008   Annotation: 9/09 during bout of constipation Qualifier: Diagnosis of  By: [name], [name]    . HIP PAIN, BILATERAL 07/17/2008   Qualifier: Diagnosis of  By: [name], [name]   . History of depression   . History of kidney stones   . HIV (human immunodeficiency virus infection) ([place]) 1991   on meds since initial dx.   Marland Kitchen HLD (hyperlipidemia) 11/19/2013  . Human immunodeficiency virus (HIV) disease ([place]) 05/12/2006   Qualifier: Diagnosis of  By: [name], [name]    . Hx of CABG 09/03/2006   Annotation: 2003 Qualifier: Diagnosis of  By: [name], [name]    . Hyperkalemia 03/23/2014  . Hyperlipidemia   . Hypertension   . Hyponatremia 03/23/2014  . INGUINAL LYMPHADENOPATHY, RIGHT 04/03/2009   Qualifier: Diagnosis of  By: [name], [name]    . Keratoma 01/24/2015  . KNEE PAIN, BILATERAL 07/17/2008   Qualifier: Diagnosis of  By: [name], [name]   . Lesion of breast 07/21/2015  . Lipodystrophy 12/20/2010  . Memory loss 09/18/2008   Qualifier: Diagnosis of  By: [name], [name]   . Metatarsal deformity 01/24/2015  . Nasal abscess 12/04/2015  . Night sweats 07/06/2012  . Nocturia 03/06/2013  . NSTEMI (non-ST elevated myocardial infarction) ([place]) 11/29/2016  . Pain in joint, ankle and foot 01/19/2015  . Pancreatitis 11/2013   attributed to HIV meds.   . Pedal edema 05/21/2014  . PERIPHERAL VASCULAR DISEASE 07/20/2008   Qualifier: Diagnosis of  By: [name], [name]   . Posterior cervical lymphadenopathy 03/30/2014  . Protein-calorie malnutrition, severe ([place]) 03/23/2014  . SHINGLES, HX OF 05/03/2009   Annotation: R leg Qualifier: Diagnosis of  By: [name], [name]    . STEMI (ST elevation myocardial infarction) ([place]) 07/29/2015  . Unstable angina ([place]) 11/24/2016  . WEIGHT LOSS, ABNORMAL 04/03/2009   Qualifier: Diagnosis of  By:  [name], [name]      Social History   Socioeconomic History  . Marital status: Divorced    Spouse name: Not on file  . Number of children: 4  . Years of education: Not on file  . Highest education level: Not on file  Occupational History  . 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   Tobacco Use  . Smoking status: Never Smoker  . Smokeless tobacco: Never Used  Substance and Sexual Activity  . Alcohol use: Yes    Alcohol/week: 0.0 standard drinks    Comment: rare  . Drug use: No  . Sexual activity: Not Currently    Comment: declined condoms  Other Topics Concern  . Not on file  Social History Narrative   Lives alone.  Supportive friends and family.  His HIV Dx is not a secret.    Social Determinants of Health   Financial Resource Strain:   . Difficulty of Paying Living Expenses:   Food Insecurity:   . Worried About Charity fundraiser in the Last Year:   . Arboriculturist in the Last Year:   Transportation Needs:   . Film/video editor (Medical):   Marland Kitchen Lack of Transportation (Non-Medical):   Physical Activity:   . Days of Exercise per Week:   . Minutes of Exercise per Session:   Stress:   . Feeling of Stress :   Social Connections:   .  Frequency of Communication with Friends and Family:   . Frequency of Social Gatherings with Friends and Family:   . Attends Religious Services:   . Active Member of Clubs or Organizations:   . Attends Archivist Meetings:   Marland Kitchen Marital Status:   Intimate Partner Violence:   . Fear of Current or Ex-Partner:   . Emotionally Abused:   Marland Kitchen Physically Abused:   . Sexually Abused:     Past Surgical History:  Procedure Laterality Date  . CARDIAC CATHETERIZATION N/A 07/29/2015   Procedure: Left Heart Cath and Coronary Angiography;  Surgeon: [name], MD;  Location: [facility];  Service: Cardiovascular;  Laterality: N/A;  . CARDIAC CATHETERIZATION N/A  07/29/2015   Procedure: Coronary Stent Intervention;  Surgeon: [name], MD;  Location: [facility];  Service: Cardiovascular;  Laterality: N/A;  . COLONOSCOPY  12/02/2019  . CORONARY ARTERY BYPASS GRAFT  05/2002  . CORONARY STENT INTERVENTION N/A 12/01/2016   Procedure: Coronary Stent Intervention;  Surgeon: [name], MD;  Location: [facility];  Service: Cardiovascular;  Laterality: N/A;  . CORONARY STENT INTERVENTION N/A 10/21/2017   Procedure: CORONARY STENT INTERVENTION;  Surgeon: Jettie Booze, MD;  Location: [facility];  Service: Cardiovascular;  Laterality: N/A;  . LEFT HEART CATH AND CORS/GRAFTS ANGIOGRAPHY N/A 11/24/2016   Procedure: Left Heart Cath and Cors/Grafts Angiography;  Surgeon: Nelva Bush, MD;  Location: [facility];  Service: Cardiovascular;  Laterality: N/A;  . LEFT HEART CATH AND CORS/GRAFTS ANGIOGRAPHY N/A 12/01/2016   Procedure: Left Heart Cath and Cors/Grafts Angiography;  Surgeon: [name], MD;  Location: [facility];  Service: Cardiovascular;  Laterality: N/A;  . LEFT HEART CATH AND CORS/GRAFTS ANGIOGRAPHY N/A 10/21/2017   Procedure: LEFT HEART CATH AND CORS/GRAFTS ANGIOGRAPHY;  Surgeon: Jettie Booze, MD;  Location: [facility];  Service: Cardiovascular;  Laterality: N/A;  . VASECTOMY      Family History  Problem Relation Age of Onset  . Hyperlipidemia Mother   . Diabetes Mother        paternal grandparents/1 brother  . Hyperlipidemia Father   . Hypertension Father        paternal grandmother/3 brothers/1 sister  . Arthritis Other        mother/father/paternal grandparents  . Breast cancer Maternal Aunt        paternal aunt  . Lung cancer Maternal Aunt   . Heart disease Other        parents/maternal grandparents/ 2 brothers  . Stroke Paternal Grandmother   . Mental retardation Sister   . Colon cancer Neg Hx   . Esophageal cancer Neg Hx   . Pancreatic cancer Neg Hx   .  Stomach cancer Neg Hx   . Liver disease Neg Hx   . Colon polyps Neg Hx   . Rectal cancer Neg Hx     No Known Allergies  Current Outpatient Medications on File Prior to Visit  Medication Sig Dispense Refill  . acetaminophen (TYLENOL) 325 MG tablet Take 2 tablets (650 mg total) by mouth every 4 (four) hours as needed for headache or mild pain.    Marland Kitchen aspirin 81 MG chewable tablet Chew 81 mg by mouth daily.    Marland Kitchen atorvastatin (LIPITOR) 20 MG tablet TAKE 1 TABLET(20 MG) BY MOUTH DAILY 90 tablet 1  . b complex vitamins tablet Take 1 tablet by mouth daily. 30 tablet 11  . Cholecalciferol  2000 units CAPS Take 1 capsule (2,000 Units total) by mouth daily. 30 each 11  . fenofibrate 54 MG tablet TAKE 1 TABLET(54 MG) BY MOUTH DAILY 90 tablet 2  . furosemide (LASIX) 20 MG tablet Take 2 tablets (40 mg total) by mouth daily. 180 tablet 0  . glucose blood (FREESTYLE TEST STRIPS) test strip USE TO CHECK BLOOD SUGAR THREE TIMES DAILY 300 each 3  . isosorbide mononitrate (IMDUR) 30 MG 24 hr tablet TAKE 1 TABLET BY MOUTH DAILY 90 tablet 2  . Lancets (FREESTYLE) lancets Use as directed three times a day to check blood sugar.  DX E11.9 100 each 5  . lisinopril (ZESTRIL) 5 MG tablet TAKE 1 TABLET(5 MG) BY MOUTH DAILY 90 tablet 2  . metFORMIN (GLUCOPHAGE-XR) 500 MG 24 hr tablet TAKE 1 TABLET(500 MG) BY MOUTH DAILY WITH SUPPER 90 tablet 1  . metoprolol tartrate (LOPRESSOR) 25 MG tablet Take 0.5 tablets (12.5 mg total) by mouth 2 (two) times daily. NEED APPOINTMENT FOR FUTURE REFILL 60 tablet 1  . nitroGLYCERIN (NITROSTAT) 0.4 MG SL tablet PLACE 1 TABLET UNDER THE TONGUE EVERY 5 MINUTES AS NEEDED FOR CHEST PAIN 25 tablet 3  . pantoprazole (PROTONIX) 40 MG tablet TAKE 1 TABLET(40 MG) BY MOUTH DAILY 30 tablet 11  . PIFELTRO 100 MG TABS tablet TAKE 1 TABLET(100 MG) BY MOUTH DAILY 30 tablet 5  . pioglitazone (ACTOS) 15 MG tablet TAKE 1 TABLET(15 MG) BY MOUTH DAILY 30 tablet 3  . potassium chloride SA (KLOR-CON) 20 MEQ  tablet TAKE 1 TABLET(20 MEQ) BY MOUTH THREE TIMES DAILY 270 tablet 1  . Probiotic Product (PROBIOTIC DAILY) CAPS Take 1 by mouth daily 30 capsule 11  . repaglinide (PRANDIN) 2 MG tablet TAKE 2 TABLETS BY MOUTH BEFORE BREAKFAST, 1 TABLET BEFORE LUNCH AND, 2 TABLETS BEFORE SUPPER 450 tablet 1  . sodium bicarbonate 650 MG tablet Take 650 mg by mouth 3 (three) times daily.    . tamsulosin (FLOMAX) 0.4 MG CAPS capsule TK 1 C PO BID  11  . TIVICAY 50 MG tablet TAKE 1 TABLET BY MOUTH DAILY. STOP JULUCA AND PREZCOBIX 30 tablet 5  . triamcinolone cream (KENALOG) 0.1 % apply twice daily as needed to dry, itchy skin  2  . valACYclovir (VALTREX) 500 MG tablet TAKE 1 TABLET BY MOUTH DAILY 30 tablet 5  . VASCEPA 1 g capsule TAKE 2 CAPSULES(2 GRAMS) BY MOUTH TWICE DAILY 120 capsule 2   Current Facility-Administered Medications on File Prior to Visit  Medication Dose Route Frequency Provider Last Rate Last Admin  . 0.9 %  sodium chloride infusion  500 mL Intravenous Once Milus Banister, MD      . dextrose 5 % solution   Intravenous Continuous Milus Banister, MD        BP 110/60   Temp 98.3 F (36.8 C)   Wt 140 lb (63.5 kg)   BMI 25.61 kg/m       Objective:   Physical Exam Vitals and nursing note reviewed.  Constitutional:      Appearance: Normal appearance.  Cardiovascular:     Rate and Rhythm: Normal rate and regular rhythm.     Pulses: Normal pulses.     Heart sounds: Normal heart sounds.  Pulmonary:     Effort: Pulmonary effort is normal.     Breath sounds: Normal breath sounds.  Abdominal:     General: Abdomen is flat.     Palpations: Abdomen is soft.  Tenderness: There is no right CVA tenderness or left CVA tenderness.  Musculoskeletal:        General: Normal range of motion.  Skin:    General: Skin is warm and dry.  Neurological:     General: No focal deficit present.     Mental Status: He is oriented to person, place, and time.  Psychiatric:        Mood and Affect: Mood  normal.        Behavior: Behavior normal.       Assessment & Plan:  1. OAB (overactive bladder) - Urinalysis clear except for concentrated urine.  Encouraged to increase fluids. He likely has some OAB as well. He has an upcoming appointment with is urologist and will discuss this with him.  - POC Urinalysis Dipstick  Dorothyann Peng, NP

## 2019-12-10 ENCOUNTER — Other Ambulatory Visit: Payer: Self-pay | Admitting: Endocrinology

## 2019-12-15 ENCOUNTER — Other Ambulatory Visit: Payer: Self-pay | Admitting: Endocrinology

## 2019-12-27 NOTE — Progress Notes (Addendum)
HPI: FU coronary artery disease. Patient is status post coronary artery bypass and graft in 2003. Carotid Dopplers December 2016 showed 1-39% bilateral stenosis. Patient had PCI of the sequential graft to the ramus and OM branches4/18.Last echocardiogram March 2019 showed normal LV function, grade 1 diastolic dysfunction and mild mitral regurgitation. Patient had PCI of the saphenous vein graft to the diagonal andproximal graft of the saphenous vein graft to the obtuse marginal 3/19.The previously ballooned second part of SVG to obtuse marginal jump graft is now occluded, there were left to left collaterals filling the distal left circumflex artery.Since last seen, he has dyspnea with more vigorous activities but not routine activities.  No orthopnea, PND, pedal edema, chest pain, palpitations or syncope.  Current Outpatient Medications  Medication Sig Dispense Refill  . acetaminophen (TYLENOL) 325 MG tablet Take 2 tablets (650 mg total) by mouth every 4 (four) hours as needed for headache or mild pain.    Marland Kitchen aspirin 81 MG chewable tablet Chew 81 mg by mouth daily.    Marland Kitchen atorvastatin (LIPITOR) 20 MG tablet TAKE 1 TABLET(20 MG) BY MOUTH DAILY 90 tablet 1  . b complex vitamins tablet Take 1 tablet by mouth daily. 30 tablet 11  . Cholecalciferol 2000 units CAPS Take 1 capsule (2,000 Units total) by mouth daily. 30 each 11  . dolutegravir (TIVICAY) 50 MG tablet TAKE 1 TABLET BY MOUTH DAILY with Pifeltro 30 tablet 5  . doravirine (PIFELTRO) 100 MG TABS tablet TAKE 1 TABLET(100 MG) BY MOUTH DAILY with Tivicay 30 tablet 5  . fenofibrate 54 MG tablet TAKE 1 TABLET(54 MG) BY MOUTH DAILY 90 tablet 2  . furosemide (LASIX) 20 MG tablet Take 2 tablets (40 mg total) by mouth daily. 180 tablet 0  . glucose blood (FREESTYLE TEST STRIPS) test strip USE TO CHECK BLOOD SUGAR THREE TIMES DAILY 300 each 3  . isosorbide mononitrate (IMDUR) 30 MG 24 hr tablet TAKE 1 TABLET BY MOUTH DAILY 90 tablet 2  .  Lancets (FREESTYLE) lancets Use as directed three times a day to check blood sugar.  DX E11.9 100 each 5  . lisinopril (ZESTRIL) 5 MG tablet TAKE 1 TABLET(5 MG) BY MOUTH DAILY 90 tablet 2  . metFORMIN (GLUCOPHAGE-XR) 500 MG 24 hr tablet TAKE 1 TABLET(500 MG) BY MOUTH DAILY WITH SUPPER 90 tablet 1  . metoprolol tartrate (LOPRESSOR) 25 MG tablet Take 0.5 tablets (12.5 mg total) by mouth 2 (two) times daily. NEED APPOINTMENT FOR FUTURE REFILL 60 tablet 1  . nitroGLYCERIN (NITROSTAT) 0.4 MG SL tablet PLACE 1 TABLET UNDER THE TONGUE EVERY 5 MINUTES AS NEEDED FOR CHEST PAIN 25 tablet 3  . pantoprazole (PROTONIX) 40 MG tablet TAKE 1 TABLET(40 MG) BY MOUTH DAILY 30 tablet 11  . pioglitazone (ACTOS) 15 MG tablet TAKE 1 TABLET(15 MG) BY MOUTH DAILY 30 tablet 3  . potassium chloride SA (KLOR-CON) 20 MEQ tablet TAKE 1 TABLET(20 MEQ) BY MOUTH THREE TIMES DAILY 270 tablet 1  . Probiotic Product (PROBIOTIC DAILY) CAPS Take 1 by mouth daily 30 capsule 11  . repaglinide (PRANDIN) 2 MG tablet TAKE 2 TABLETS BY MOUTH BEFORE BREAKFAST, 1 TABLET BEFORE LUNCH AND, 2 TABLETS BEFORE SUPPER 450 tablet 1  . sodium bicarbonate 650 MG tablet Take 650 mg by mouth 3 (three) times daily.    . tamsulosin (FLOMAX) 0.4 MG CAPS capsule TK 1 C PO BID  11  . triamcinolone cream (KENALOG) 0.1 % apply twice daily as needed to dry,  itchy skin  2  . valACYclovir (VALTREX) 500 MG tablet TAKE 1 TABLET BY MOUTH DAILY 30 tablet 5  . VASCEPA 1 g capsule TAKE 2 CAPSULES(2 GRAMS) BY MOUTH TWICE DAILY 120 capsule 2   Current Facility-Administered Medications  Medication Dose Route Frequency Provider Last Rate Last Admin  . 0.9 %  sodium chloride infusion  500 mL Intravenous Once Milus Banister, MD      . dextrose 5 % solution   Intravenous Continuous Milus Banister, MD         Past Medical History:  Diagnosis Date  . Absolute anemia 05/28/2014  . Aortic stenosis 09/17/2015  . Arthritis   . Ascites 11/18/2013  . BELLS PALSY 07/19/2010    Qualifier: Diagnosis of  By: [name], [name]    . CAD (coronary artery disease)    a. s/p CABG in 2003 b. s/p PCI to SVG-PDA in 07/2015 c. 11/2016: cath showing severe native CAD with patent LIMA-LAD and SVG-D1 with 80% stenosis of SVG-OM1-OM2. Initially medical management was recommended --> presented with recurrent angina --> s/p Synergy DES to proximal body of SVG-OM1-OM2, POBA to distal graft.   Marland Kitchen CAD- S/P PCI SVG-OM1 12/01/16 05/12/2006   Qualifier: Diagnosis of  By: [name], [name]    . Carotid bruit 07/10/2015  . Chest pain 11/22/2016  . Chronic kidney disease   . Chronic renal insufficiency, stage III (moderate) 12/02/2016  . Constipation 05/28/2014  . COPD 07/20/2008   Qualifier: Diagnosis of  By: [name], [name]   . DEPRESSION 09/03/2006   Qualifier: Diagnosis of  By: [name], [name]    . Dermatitis 11/27/2012  . Diabetes mellitus   . Diarrhea 11/20/2013  . DM (diabetes mellitus), type 2 with ophthalmic complications ([place]) 0000000   Qualifier: Diagnosis of  By: [name], [name]    . Dry eye syndrome 12/20/2010  . Epicondylitis 06/05/2013   right  . Erectile dysfunction 01/01/2012  . Essential hypertension 05/12/2006   Qualifier: Diagnosis of  By: [name], [name]    . GENITAL HERPES 05/03/2009   Qualifier: Diagnosis of  By: [name], [name]    . GERD 09/03/2006   Qualifier: Diagnosis of  By: [name], [name]    . GERD (gastroesophageal reflux disease)   . HEARING LOSS, SENSORINEURAL 05/12/2006   Qualifier: Diagnosis of  By: [name], [name]    . HEMATOCHEZIA 04/18/2008   Annotation: 9/09 during bout of constipation Qualifier: Diagnosis of  By: [name], [name]    . HIP PAIN, BILATERAL 07/17/2008   Qualifier: Diagnosis of  By: [name], [name]   . History of depression   . History of kidney stones   . HIV (human immunodeficiency virus infection) ([place]) 1991   on meds since initial dx.   Marland Kitchen HLD (hyperlipidemia) 11/19/2013  . Human immunodeficiency virus (HIV) disease  ([place]) 05/12/2006   Qualifier: Diagnosis of  By: [name], [name]    . Hx of CABG 09/03/2006   Annotation: 2003 Qualifier: Diagnosis of  By: [name], [name]    . Hyperkalemia 03/23/2014  . Hyperlipidemia   . Hypertension   . Hyponatremia 03/23/2014  . INGUINAL LYMPHADENOPATHY, RIGHT 04/03/2009   Qualifier: Diagnosis of  By: [name], [name]    . Keratoma 01/24/2015  . KNEE PAIN, BILATERAL 07/17/2008   Qualifier: Diagnosis of  By: [name], [name]   . Lesion of breast 07/21/2015  . Lipodystrophy 12/20/2010  . Memory loss 09/18/2008  Qualifier: Diagnosis of  By: [name], [place] deformity 01/24/2015  . Nasal abscess 12/04/2015  . Night sweats 07/06/2012  . Nocturia 03/06/2013  . NSTEMI (non-ST elevated myocardial infarction) ([place]) 11/29/2016  . Pain in joint, ankle and foot 01/19/2015  . Pancreatitis 11/2013   attributed to HIV meds.   . Pedal edema 05/21/2014  . PERIPHERAL VASCULAR DISEASE 07/20/2008   Qualifier: Diagnosis of  By: [name], [name]   . Posterior cervical lymphadenopathy 03/30/2014  . Protein-calorie malnutrition, severe ([place]) 03/23/2014  . SHINGLES, HX OF 05/03/2009   Annotation: R leg Qualifier: Diagnosis of  By: [name], [name]    . STEMI (ST elevation myocardial infarction) ([place]) 07/29/2015  . Unstable angina ([place]) 11/24/2016  . WEIGHT LOSS, ABNORMAL 04/03/2009   Qualifier: Diagnosis of  By: [name], [name]      Past Surgical History:  Procedure Laterality Date  . CARDIAC CATHETERIZATION N/A 07/29/2015   Procedure: Left Heart Cath and Coronary Angiography;  Surgeon: [name], MD;  Location: [place] [facility];  Service: Cardiovascular;  Laterality: N/A;  . CARDIAC CATHETERIZATION N/A 07/29/2015   Procedure: Coronary Stent Intervention;  Surgeon: [name], MD;  Location: [place] [facility];  Service: Cardiovascular;  Laterality: N/A;  . COLONOSCOPY  12/02/2019  . CORONARY ARTERY BYPASS GRAFT  05/2002  . CORONARY STENT INTERVENTION N/A  12/01/2016   Procedure: Coronary Stent Intervention;  Surgeon: [name], MD;  Location: [place] [facility];  Service: Cardiovascular;  Laterality: N/A;  . CORONARY STENT INTERVENTION N/A 10/21/2017   Procedure: CORONARY STENT INTERVENTION;  Surgeon: Jettie Booze, MD;  Location: [place];  Service: Cardiovascular;  Laterality: N/A;  . LEFT HEART CATH AND CORS/GRAFTS ANGIOGRAPHY N/A 11/24/2016   Procedure: Left Heart Cath and Cors/Grafts Angiography;  Surgeon: Nelva Bush, MD;  Location: [place] [facility];  Service: Cardiovascular;  Laterality: N/A;  . LEFT HEART CATH AND CORS/GRAFTS ANGIOGRAPHY N/A 12/01/2016   Procedure: Left Heart Cath and Cors/Grafts Angiography;  Surgeon: [name], MD;  Location: [place] [facility];  Service: Cardiovascular;  Laterality: N/A;  . LEFT HEART CATH AND CORS/GRAFTS ANGIOGRAPHY N/A 10/21/2017   Procedure: LEFT HEART CATH AND CORS/GRAFTS ANGIOGRAPHY;  Surgeon: Jettie Booze, MD;  Location: [facility];  Service: Cardiovascular;  Laterality: N/A;  . VASECTOMY      Social History   Socioeconomic History  . Marital status: Divorced    Spouse name: Not on file  . Number of children: 4  . Years of education: Not on file  . Highest education level: Not on file  Occupational History  . 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   Tobacco Use  . Smoking status: Never Smoker  . Smokeless tobacco: Never Used  Substance and Sexual Activity  . Alcohol use: Yes    Alcohol/week: 0.0 standard drinks    Comment: rare  . Drug use: No  . Sexual activity: Not Currently    Comment: declined condoms  Other Topics Concern  . Not on file  Social History Narrative   Lives alone.  Supportive friends and family.  His HIV Dx is not a secret.    Social Determinants of Health   Financial Resource Strain:   . Difficulty of Paying Living Expenses:   Food Insecurity:   .  Worried About Charity fundraiser in the Last Year:   .  Ran Out of Food in the Last Year:   Transportation Needs:   . Film/video editor (Medical):   Marland Kitchen Lack of Transportation (Non-Medical):   Physical Activity:   . Days of Exercise per Week:   . Minutes of Exercise per Session:   Stress:   . Feeling of Stress :   Social Connections:   . Frequency of Communication with Friends and Family:   . Frequency of Social Gatherings with Friends and Family:   . Attends Religious Services:   . Active Member of Clubs or Organizations:   . Attends Archivist Meetings:   Marland Kitchen Marital Status:   Intimate Partner Violence:   . Fear of Current or Ex-Partner:   . Emotionally Abused:   Marland Kitchen Physically Abused:   . Sexually Abused:     Family History  Problem Relation Age of Onset  . Hyperlipidemia Mother   . Diabetes Mother        paternal grandparents/1 brother  . Hyperlipidemia Father   . Hypertension Father        paternal grandmother/3 brothers/1 sister  . Arthritis Other        mother/father/paternal grandparents  . Breast cancer Maternal Aunt        paternal aunt  . Lung cancer Maternal Aunt   . Heart disease Other        parents/maternal grandparents/ 2 brothers  . Stroke Paternal Grandmother   . Mental retardation Sister   . Colon cancer Neg Hx   . Esophageal cancer Neg Hx   . Pancreatic cancer Neg Hx   . Stomach cancer Neg Hx   . Liver disease Neg Hx   . Colon polyps Neg Hx   . Rectal cancer Neg Hx     ROS: no fevers or chills, productive cough, hemoptysis, dysphasia, odynophagia, melena, hematochezia, dysuria, hematuria, rash, seizure activity, orthopnea, PND, pedal edema, claudication. Remaining systems are negative.  Physical Exam: Well-developed well-nourished in no acute distress.  Skin is warm and dry.  HEENT is normal.  Neck is supple.  Chest is clear to auscultation with normal expansion.  Cardiovascular exam is regular rate and rhythm.  2/6 systolic  murmur left sternal border. Abdominal exam nontender or distended. No masses palpated. Extremities show no edema. neuro grossly intact  A/P  1 coronary artery disease-patient denies chest pain.  Plan to continue medical therapy with aspirin and statin.  2 history of mild aortic stenosis-we will arrange follow-up echocardiogram.  3 hypertension-blood pressure is controlled.  Continue present medications.  4 hyperlipidemia-continue statin.  5 chronic stage III kidney disease-followed by primary care.  Kirk Ruths, MD

## 2019-12-28 ENCOUNTER — Other Ambulatory Visit: Payer: Self-pay | Admitting: *Deleted

## 2019-12-28 DIAGNOSIS — B20 Human immunodeficiency virus [HIV] disease: Secondary | ICD-10-CM

## 2019-12-28 MED ORDER — DORAVIRINE 100 MG PO TABS: ORAL_TABLET | ORAL | 5 refills | Status: DC

## 2019-12-28 MED ORDER — TIVICAY 50 MG PO TABS: ORAL_TABLET | ORAL | 5 refills | Status: DC

## 2020-01-02 ENCOUNTER — Other Ambulatory Visit: Payer: Self-pay | Admitting: Endocrinology

## 2020-01-02 DIAGNOSIS — E1165 Type 2 diabetes mellitus with hyperglycemia: Secondary | ICD-10-CM

## 2020-01-03 ENCOUNTER — Ambulatory Visit (INDEPENDENT_AMBULATORY_CARE_PROVIDER_SITE_OTHER): Payer: Medicare Other | Admitting: Cardiology

## 2020-01-03 ENCOUNTER — Other Ambulatory Visit (INDEPENDENT_AMBULATORY_CARE_PROVIDER_SITE_OTHER): Payer: Medicare Other

## 2020-01-03 ENCOUNTER — Encounter: Payer: Self-pay | Admitting: Cardiology

## 2020-01-03 ENCOUNTER — Other Ambulatory Visit: Payer: Self-pay

## 2020-01-03 VITALS — BP 140/83 | HR 57 | Temp 96.8°F | Ht 63.0 in | Wt 141.6 lb

## 2020-01-03 DIAGNOSIS — I2510 Atherosclerotic heart disease of native coronary artery without angina pectoris: Secondary | ICD-10-CM

## 2020-01-03 DIAGNOSIS — I350 Nonrheumatic aortic (valve) stenosis: Secondary | ICD-10-CM | POA: Diagnosis not present

## 2020-01-03 DIAGNOSIS — I10 Essential (primary) hypertension: Secondary | ICD-10-CM | POA: Diagnosis not present

## 2020-01-03 DIAGNOSIS — E1165 Type 2 diabetes mellitus with hyperglycemia: Secondary | ICD-10-CM | POA: Diagnosis not present

## 2020-01-03 DIAGNOSIS — E7800 Pure hypercholesterolemia, unspecified: Secondary | ICD-10-CM | POA: Diagnosis not present

## 2020-01-03 LAB — COMPREHENSIVE METABOLIC PANEL
ALT: 29 U/L (ref 0–53)
AST: 23 U/L (ref 0–37)
Albumin: 3.9 g/dL (ref 3.5–5.2)
Alkaline Phosphatase: 46 U/L (ref 39–117)
BUN: 19 mg/dL (ref 6–23)
CO2: 26 mEq/L (ref 19–32)
Calcium: 8.8 mg/dL (ref 8.4–10.5)
Chloride: 108 mEq/L (ref 96–112)
Creatinine, Ser: 1.53 mg/dL — ABNORMAL HIGH (ref 0.40–1.50)
GFR: 45.01 mL/min — ABNORMAL LOW (ref >60.00)
Glucose, Bld: 94 mg/dL (ref 70–99)
Potassium: 4.5 mEq/L (ref 3.5–5.1)
Sodium: 141 mEq/L (ref 135–145)
Total Bilirubin: 0.4 mg/dL (ref 0.2–1.2)
Total Protein: 6.5 g/dL (ref 6.0–8.3)

## 2020-01-03 LAB — HEMOGLOBIN A1C: Hgb A1c MFr Bld: 6.8 % — ABNORMAL HIGH (ref 4.6–6.5)

## 2020-01-03 NOTE — Patient Instructions (Signed)
Medication Instructions:  NO CHANGE *If you need a refill on your cardiac medications before your next appointment, please call your pharmacy*   Lab Work: If you have labs (blood work) drawn today and your tests are completely normal, you will receive your results only by: . MyChart Message (if you have MyChart) OR . A paper copy in the mail If you have any lab test that is abnormal or we need to change your treatment, we will call you to review the results.   Testing/Procedures:  Your physician has requested that you have an echocardiogram. Echocardiography is a painless test that uses sound waves to create images of your heart. It provides your doctor with information about the size and shape of your heart and how well your heart's chambers and valves are working. This procedure takes approximately one hour. There are no restrictions for this procedure.[address]   Follow-Up: At [facility], you and your health needs are our priority.  As part of our continuing mission to provide you with exceptional heart care, we have created designated Provider Care Teams.  These Care Teams include your primary Cardiologist (physician) and Advanced Practice Providers (APPs -  Physician Assistants and Nurse Practitioners) who all work together to provide you with the care you need, when you need it.  We recommend signing up for the patient portal called "MyChart".  Sign up information is provided on this After Visit Summary.  MyChart is used to connect with patients for Virtual Visits (Telemedicine).  Patients are able to view lab/test results, encounter notes, upcoming appointments, etc.  Non-urgent messages can be sent to your provider as well.   To learn more about what you can do with MyChart, go to [email or web address].    Your next appointment:   6 month(s)  The format for your next appointment:   Either In Person or Virtual  Provider:   You may see Brian Crenshaw, MD or one  of the following Advanced Practice Providers on your designated Care Team:    Luke Kilroy, PA-C  Callie Goodrich, PA-C  Jesse Cleaver, FNP     

## 2020-01-04 ENCOUNTER — Encounter: Payer: Self-pay | Admitting: Podiatry

## 2020-01-04 ENCOUNTER — Ambulatory Visit (INDEPENDENT_AMBULATORY_CARE_PROVIDER_SITE_OTHER): Payer: Medicare Other | Admitting: Podiatry

## 2020-01-04 DIAGNOSIS — M79674 Pain in right toe(s): Secondary | ICD-10-CM

## 2020-01-04 DIAGNOSIS — L600 Ingrowing nail: Secondary | ICD-10-CM | POA: Diagnosis not present

## 2020-01-04 DIAGNOSIS — B351 Tinea unguium: Secondary | ICD-10-CM | POA: Diagnosis not present

## 2020-01-04 DIAGNOSIS — E1151 Type 2 diabetes mellitus with diabetic peripheral angiopathy without gangrene: Secondary | ICD-10-CM

## 2020-01-04 DIAGNOSIS — L84 Corns and callosities: Secondary | ICD-10-CM | POA: Diagnosis not present

## 2020-01-04 DIAGNOSIS — M79675 Pain in left toe(s): Secondary | ICD-10-CM

## 2020-01-04 NOTE — Patient Instructions (Addendum)
EPSOM SALT FOOT SOAK INSTRUCTIONS  Shopping List:  A. Plain epsom salt (not scented) B. Neosporin Cream/Ointment or Bacitracin Cream/Ointment C. 1-inch fabric band-aids   1.  Place 1/4 cup of epsom salts in 2 quarts of warm tap water. IF YOU ARE DIABETIC, OR HAVE NEUROPATHY, CHECK THE TEMPERATURE OF THE WATER WITH YOUR ELBOW.  2.  Submerge your foot/feet in the solution and soak for 10-15 minutes.      3.  Next, remove your foot or feet from solution, blot dry the affected area.    4.  Apply antibiotic ointment and cover with fabric band-aid .  5.  This soak should be done once a day for 7 days.   6.  Monitor for any signs/symptoms of infection such as redness, swelling, odor, drainage, increased pain, or non-healing of digit.   7.  Please do not hesitate to call the office and speak to a Nurse or Doctor if you have questions.   8.  If you experience fever, chills, nightsweats, nausea or vomiting with worsening of digit, please go to the emergency room.   Diabetes Mellitus and Foot Care Foot care is an important part of your health, especially when you have diabetes. Diabetes may cause you to have problems because of poor blood flow (circulation) to your feet and legs, which can cause your skin to:  Become thinner and drier.  Break more easily.  Heal more slowly.  Peel and crack. You may also have nerve damage (neuropathy) in your legs and feet, causing decreased feeling in them. This means that you may not notice minor injuries to your feet that could lead to more serious problems. Noticing and addressing any potential problems early is the best way to prevent future foot problems. How to care for your feet Foot hygiene  Wash your feet daily with warm water and mild soap. Do not use hot water. Then, pat your feet and the areas between your toes until they are completely dry. Do not soak your feet as this can dry your skin.  Trim your toenails straight across. Do not dig under  them or around the cuticle. File the edges of your nails with an emery board or nail file.  Apply a moisturizing lotion or petroleum jelly to the skin on your feet and to dry, brittle toenails. Use lotion that does not contain alcohol and is unscented. Do not apply lotion between your toes. Shoes and socks  Wear clean socks or stockings every day. Make sure they are not too tight. Do not wear knee-high stockings since they may decrease blood flow to your legs.  Wear shoes that fit properly and have enough cushioning. Always look in your shoes before you put them on to be sure there are no objects inside.  To break in new shoes, wear them for just a few hours a day. This prevents injuries on your feet. Wounds, scrapes, corns, and calluses  Check your feet daily for blisters, cuts, bruises, sores, and redness. If you cannot see the bottom of your feet, use a mirror or ask someone for help.  Do not cut corns or calluses or try to remove them with medicine.  If you find a minor scrape, cut, or break in the skin on your feet, keep it and the skin around it clean and dry. You may clean these areas with mild soap and water. Do not clean the area with peroxide, alcohol, or iodine.  If you have a wound, scrape, corn,   or callus on your foot, look at it several times a day to make sure it is healing and not infected. Check for: ? Redness, swelling, or pain. ? Fluid or blood. ? Warmth. ? Pus or a bad smell. General instructions  Do not cross your legs. This may decrease blood flow to your feet.  Do not use heating pads or hot water bottles on your feet. They may burn your skin. If you have lost feeling in your feet or legs, you may not know this is happening until it is too late.  Protect your feet from hot and cold by wearing shoes, such as at the beach or on hot pavement.  Schedule a complete foot exam at least once a year (annually) or more often if you have foot problems. If you have foot  problems, report any cuts, sores, or bruises to your health care provider immediately. Contact a health care provider if:  You have a medical condition that increases your risk of infection and you have any cuts, sores, or bruises on your feet.  You have an injury that is not healing.  You have redness on your legs or feet.  You feel burning or tingling in your legs or feet.  You have pain or cramps in your legs and feet.  Your legs or feet are numb.  Your feet always feel cold.  You have pain around a toenail. Get help right away if:  You have a wound, scrape, corn, or callus on your foot and: ? You have pain, swelling, or redness that gets worse. ? You have fluid or blood coming from the wound, scrape, corn, or callus. ? Your wound, scrape, corn, or callus feels warm to the touch. ? You have pus or a bad smell coming from the wound, scrape, corn, or callus. ? You have a fever. ? You have a red line going up your leg. Summary  Check your feet every day for cuts, sores, red spots, swelling, and blisters.  Moisturize feet and legs daily.  Wear shoes that fit properly and have enough cushioning.  If you have foot problems, report any cuts, sores, or bruises to your health care provider immediately.  Schedule a complete foot exam at least once a year (annually) or more often if you have foot problems. This information is not intended to replace advice given to you by your health care provider. Make sure you discuss any questions you have with your health care provider. Document Revised: 04/13/2019 Document Reviewed: 08/22/2016 Elsevier Patient Education  2020 [employer]  

## 2020-01-05 ENCOUNTER — Telehealth: Payer: Self-pay

## 2020-01-05 ENCOUNTER — Ambulatory Visit (INDEPENDENT_AMBULATORY_CARE_PROVIDER_SITE_OTHER): Payer: Medicare Other | Admitting: Endocrinology

## 2020-01-05 ENCOUNTER — Other Ambulatory Visit: Payer: Self-pay

## 2020-01-05 ENCOUNTER — Encounter: Payer: Self-pay | Admitting: Endocrinology

## 2020-01-05 VITALS — BP 120/70 | HR 59 | Ht 63.0 in | Wt 139.8 lb

## 2020-01-05 DIAGNOSIS — N183 Chronic kidney disease, stage 3 unspecified: Secondary | ICD-10-CM

## 2020-01-05 DIAGNOSIS — I2510 Atherosclerotic heart disease of native coronary artery without angina pectoris: Secondary | ICD-10-CM | POA: Diagnosis not present

## 2020-01-05 DIAGNOSIS — E1151 Type 2 diabetes mellitus with diabetic peripheral angiopathy without gangrene: Secondary | ICD-10-CM

## 2020-01-05 NOTE — Progress Notes (Signed)
Patient ID: [name], male   DOB: Feb 04, 1946, 74 y.o.   MRN: [id number]           Reason for Appointment: Follow-up for Type 2 Diabetes   History of Present Illness:          Date of diagnosis of type 2 diabetes mellitus: 2013        Background history:  He only mildly increased blood sugar levels at the time of diagnosis and A1c was 7.1  He had been treated initially with metformin but this was stopped subsequently when his renal function was worse  He was subsequently treated with glipizide but his blood sugars had been poorly controlled in 2016 with glipizide alone Since his A1c has been persistently over 9% he was started on Januvia 50 mg in 01/2015 At initial consultation he had a high A1c of 9.5; he was switched from glipizide to Prandin in 02/2015 Previously when he had high fasting readings he was started on Toujeo on 06/11/16, however this was stopped in April 2018 when he was getting hypoglycemia  Recent history:    Oral hypoglycemic drugs: Prandin 4 mg at breakfast, 2 mg at dinner and 2 mg before lunch , Actos 15 mg daily, metformin ER 500 mg daily   His A1c is usually just over 7% and now 6.8  Fructosamine last was 253, previously had been 245  He has not been seen in follow-up since 04/2019  Current blood sugar patterns and problems identified:  He has checked his morning blood sugars much more regularly but usually forgetting to do them at all times  Recently has blood sugars mostly in the normal range in the morning along with an episode of low blood sugar around 4 AM down to 47 and he was shaky  Most of his blood sugars around bedtime recently are fairly good except for a reading of 221  His weight is down 1 or 2 pounds lately  He is usually trying to eat 3 meals a day and generally avoiding high-fat foods  More recently also has been trying to do some walking as tolerated, may have some calf pain on the right side  No edema with Actos  Most of the time  is able to remember to take his Prandin before eating  He has been on only low-dose of Metformin partly because of renal dysfunction   Side effects from medications have been: None   Glucose monitoring:  done usually 0-1  times a day         Glucometer:  FreeStyle     Blood Glucose readings   PRE-MEAL Fasting Lunch Dinner Bedtime Overall  Glucose range:  61-110  60,87  76-221   Mean/median: 86    145 96   Previous readings:  Range from 70-135, average 102 but all in the morning   Dietician visit, most recent: 8/15 DIET:  has been trying to reduce fried/usually not eating high fat  foods; eating oatmeal in the morning  greek yogurt               Dinner usually at 6 pm .  Weight history:   Wt Readings from Last 3 Encounters:  01/05/20 139 lb 12.8 oz (63.4 kg)  01/03/20 141 lb 9.6 oz (64.2 kg)  12/08/19 140 lb (63.5 kg)    Glycemic control:   Lab Results  Component Value Date   HGBA1C 6.8 (H) 01/03/2020   HGBA1C 7.3 (H) 09/27/2019   HGBA1C 7.2 (  H) 04/26/2019   Lab Results  Component Value Date   MICROALBUR 8.1 (H) 09/27/2019   LDLCALC 58 04/26/2019   CREATININE 1.53 (H) 01/03/2020    Other active problems: See review of systems   Lab on 01/03/2020  Component Date Value Ref Range Status  . Sodium 01/03/2020 141  135 - 145 mEq/L Final  . Potassium 01/03/2020 4.5  3.5 - 5.1 mEq/L Final  . Chloride 01/03/2020 108  96 - 112 mEq/L Final  . CO2 01/03/2020 26  19 - 32 mEq/L Final  . Glucose, Bld 01/03/2020 94  70 - 99 mg/dL Final  . BUN 01/03/2020 19  6 - 23 mg/dL Final  . Creatinine, Ser 01/03/2020 1.53* 0.40 - 1.50 mg/dL Final  . Total Bilirubin 01/03/2020 0.4  0.2 - 1.2 mg/dL Final  . Alkaline Phosphatase 01/03/2020 46  39 - 117 U/L Final  . AST 01/03/2020 23  0 - 37 U/L Final  . ALT 01/03/2020 29  0 - 53 U/L Final  . Total Protein 01/03/2020 6.5  6.0 - 8.3 g/dL Final  . Albumin 01/03/2020 3.9  3.5 - 5.2 g/dL Final  . GFR 01/03/2020 45.01* >60.00 mL/min  Final  . Calcium 01/03/2020 8.8  8.4 - 10.5 mg/dL Final  . Hgb A1c MFr Bld 01/03/2020 6.8* 4.6 - 6.5 % Final   Glycemic Control Guidelines for People with Diabetes:Non Diabetic:  <6%Goal of Therapy: <7%Additional Action Suggested:  >8%        Allergies as of 01/05/2020   No Known Allergies     Medication List       Accurate as of January 05, 2020  2:46 PM. If you have any questions, ask your nurse or doctor.        STOP taking these medications   metFORMIN 500 MG 24 hr tablet Commonly known as: GLUCOPHAGE-XR Stopped by: Elayne Snare, MD     TAKE these medications   acetaminophen 325 MG tablet Commonly known as: TYLENOL Take 2 tablets (650 mg total) by mouth every 4 (four) hours as needed for headache or mild pain.   aspirin 81 MG chewable tablet Chew 81 mg by mouth daily.   atorvastatin 20 MG tablet Commonly known as: LIPITOR TAKE 1 TABLET(20 MG) BY MOUTH DAILY   b complex vitamins tablet Take 1 tablet by mouth daily.   Cholecalciferol 50 MCG (2000 UT) Caps Take 1 capsule (2,000 Units total) by mouth daily.   doravirine 100 MG Tabs tablet Commonly known as: Pifeltro TAKE 1 TABLET(100 MG) BY MOUTH DAILY with Tivicay   fenofibrate 54 MG tablet TAKE 1 TABLET(54 MG) BY MOUTH DAILY   freestyle lancets Use as directed three times a day to check blood sugar.  DX E11.9   FREESTYLE TEST STRIPS test strip Generic drug: glucose blood USE TO CHECK BLOOD SUGAR THREE TIMES DAILY   furosemide 20 MG tablet Commonly known as: LASIX Take 2 tablets (40 mg total) by mouth daily.   isosorbide mononitrate 30 MG 24 hr tablet Commonly known as: IMDUR TAKE 1 TABLET BY MOUTH DAILY   lisinopril 5 MG tablet Commonly known as: ZESTRIL TAKE 1 TABLET(5 MG) BY MOUTH DAILY   metoprolol tartrate 25 MG tablet Commonly known as: LOPRESSOR Take 0.5 tablets (12.5 mg total) by mouth 2 (two) times daily. NEED APPOINTMENT FOR FUTURE REFILL   nitroGLYCERIN 0.4 MG SL tablet Commonly known as:  NITROSTAT PLACE 1 TABLET UNDER THE TONGUE EVERY 5 MINUTES AS NEEDED FOR CHEST PAIN   pantoprazole  40 MG tablet Commonly known as: PROTONIX TAKE 1 TABLET(40 MG) BY MOUTH DAILY   pioglitazone 15 MG tablet Commonly known as: ACTOS TAKE 1 TABLET(15 MG) BY MOUTH DAILY   potassium chloride SA 20 MEQ tablet Commonly known as: KLOR-CON TAKE 1 TABLET(20 MEQ) BY MOUTH THREE TIMES DAILY   Probiotic Daily Caps Take 1 by mouth daily   repaglinide 2 MG tablet Commonly known as: PRANDIN Take 2 mg by mouth 3 (three) times daily before meals. Take 2 tablet by mouth daily before breakfast and 1 tablet by mouth daily before lunch and dinner. What changed: Another medication with the same name was removed. Continue taking this medication, and follow the directions you see here. Changed by: Elayne Snare, MD   sodium bicarbonate 650 MG tablet Take 650 mg by mouth 3 (three) times daily.   tamsulosin 0.4 MG Caps capsule Commonly known as: FLOMAX TK 1 C PO BID   Tivicay 50 MG tablet Generic drug: dolutegravir TAKE 1 TABLET BY MOUTH DAILY with Pifeltro   triamcinolone cream 0.1 % Commonly known as: KENALOG apply twice daily as needed to dry, itchy skin   valACYclovir 500 MG tablet Commonly known as: VALTREX TAKE 1 TABLET BY MOUTH DAILY   Vascepa 1 g capsule Generic drug: icosapent Ethyl TAKE 2 CAPSULES(2 GRAMS) BY MOUTH TWICE DAILY       Allergies: No Known Allergies  Past Medical History:  Diagnosis Date  . Absolute anemia 05/28/2014  . Aortic stenosis 09/17/2015  . Arthritis   . Ascites 11/18/2013  . BELLS PALSY 07/19/2010   Qualifier: Diagnosis of  By: [name], [name]    . CAD (coronary artery disease)    a. s/p CABG in 2003 b. s/p PCI to SVG-PDA in 07/2015 c. 11/2016: cath showing severe native CAD with patent LIMA-LAD and SVG-D1 with 80% stenosis of SVG-OM1-OM2. Initially medical management was recommended --> presented with recurrent angina --> s/p Synergy DES to proximal body  of SVG-OM1-OM2, POBA to distal graft.   Marland Kitchen CAD- S/P PCI SVG-OM1 12/01/16 05/12/2006   Qualifier: Diagnosis of  By: [name], [name]    . Carotid bruit 07/10/2015  . Chest pain 11/22/2016  . Chronic kidney disease   . Chronic renal insufficiency, stage III (moderate) 12/02/2016  . Constipation 05/28/2014  . COPD 07/20/2008   Qualifier: Diagnosis of  By: [name], [name]   . DEPRESSION 09/03/2006   Qualifier: Diagnosis of  By: [name], [name]    . Dermatitis 11/27/2012  . Diabetes mellitus   . Diarrhea 11/20/2013  . DM (diabetes mellitus), type 2 with ophthalmic complications ([place]) 0000000   Qualifier: Diagnosis of  By: [name], [name]    . Dry eye syndrome 12/20/2010  . Epicondylitis 06/05/2013   right  . Erectile dysfunction 01/01/2012  . Essential hypertension 05/12/2006   Qualifier: Diagnosis of  By: [name], [name]    . GENITAL HERPES 05/03/2009   Qualifier: Diagnosis of  By: [name], [name]    . GERD 09/03/2006   Qualifier: Diagnosis of  By: [name], [name]    . GERD (gastroesophageal reflux disease)   . HEARING LOSS, SENSORINEURAL 05/12/2006   Qualifier: Diagnosis of  By: [name], [name]    . HEMATOCHEZIA 04/18/2008   Annotation: 9/09 during bout of constipation Qualifier: Diagnosis of  By: [name], [name]    . HIP PAIN, BILATERAL 07/17/2008   Qualifier: Diagnosis of  By: [name], [name]   . History of depression   .  History of kidney stones   . HIV (human immunodeficiency virus infection) ([place]) 1991   on meds since initial dx.   Marland Kitchen HLD (hyperlipidemia) 11/19/2013  . Human immunodeficiency virus (HIV) disease ([place]) 05/12/2006   Qualifier: Diagnosis of  By: [name], [name]    . Hx of CABG 09/03/2006   Annotation: 2003 Qualifier: Diagnosis of  By: [name], [name]    . Hyperkalemia 03/23/2014  . Hyperlipidemia   . Hypertension   . Hyponatremia 03/23/2014  . INGUINAL LYMPHADENOPATHY, RIGHT 04/03/2009   Qualifier: Diagnosis of  By: [name], [name]    . Keratoma  01/24/2015  . KNEE PAIN, BILATERAL 07/17/2008   Qualifier: Diagnosis of  By: [name], [name]   . Lesion of breast 07/21/2015  . Lipodystrophy 12/20/2010  . Memory loss 09/18/2008   Qualifier: Diagnosis of  By: [name], [name]   . Metatarsal deformity 01/24/2015  . Nasal abscess 12/04/2015  . Night sweats 07/06/2012  . Nocturia 03/06/2013  . NSTEMI (non-ST elevated myocardial infarction) ([place]) 11/29/2016  . Pain in joint, ankle and foot 01/19/2015  . Pancreatitis 11/2013   attributed to HIV meds.   . Pedal edema 05/21/2014  . PERIPHERAL VASCULAR DISEASE 07/20/2008   Qualifier: Diagnosis of  By: [name], [name]   . Posterior cervical lymphadenopathy 03/30/2014  . Protein-calorie malnutrition, severe ([place]) 03/23/2014  . SHINGLES, HX OF 05/03/2009   Annotation: R leg Qualifier: Diagnosis of  By: [name], [name]    . STEMI (ST elevation myocardial infarction) ([place]) 07/29/2015  . Unstable angina ([place]) 11/24/2016  . WEIGHT LOSS, ABNORMAL 04/03/2009   Qualifier: Diagnosis of  By: [name], [name]      Past Surgical History:  Procedure Laterality Date  . CARDIAC CATHETERIZATION N/A 07/29/2015   Procedure: Left Heart Cath and Coronary Angiography;  Surgeon: [name], MD;  Location: [facility];  Service: Cardiovascular;  Laterality: N/A;  . CARDIAC CATHETERIZATION N/A 07/29/2015   Procedure: Coronary Stent Intervention;  Surgeon: [name], MD;  Location: [facility];  Service: Cardiovascular;  Laterality: N/A;  . COLONOSCOPY  12/02/2019  . CORONARY ARTERY BYPASS GRAFT  05/2002  . CORONARY STENT INTERVENTION N/A 12/01/2016   Procedure: Coronary Stent Intervention;  Surgeon: [name], MD;  Location: [facility];  Service: Cardiovascular;  Laterality: N/A;  . CORONARY STENT INTERVENTION N/A 10/21/2017   Procedure: CORONARY STENT INTERVENTION;  Surgeon: Jettie Booze, MD;  Location: [facility];  Service: Cardiovascular;  Laterality: N/A;  . LEFT  HEART CATH AND CORS/GRAFTS ANGIOGRAPHY N/A 11/24/2016   Procedure: Left Heart Cath and Cors/Grafts Angiography;  Surgeon: Nelva Bush, MD;  Location: [facility];  Service: Cardiovascular;  Laterality: N/A;  . LEFT HEART CATH AND CORS/GRAFTS ANGIOGRAPHY N/A 12/01/2016   Procedure: Left Heart Cath and Cors/Grafts Angiography;  Surgeon: [name], MD;  Location: [facility];  Service: Cardiovascular;  Laterality: N/A;  . LEFT HEART CATH AND CORS/GRAFTS ANGIOGRAPHY N/A 10/21/2017   Procedure: LEFT HEART CATH AND CORS/GRAFTS ANGIOGRAPHY;  Surgeon: Jettie Booze, MD;  Location: [facility];  Service: Cardiovascular;  Laterality: N/A;  . VASECTOMY      Family History  Problem Relation Age of Onset  . Hyperlipidemia Mother   . Diabetes Mother        paternal grandparents/1 brother  . Hyperlipidemia Father   . Hypertension Father  paternal grandmother/3 brothers/1 sister  . Arthritis Other        mother/father/paternal grandparents  . Breast cancer Maternal Aunt        paternal aunt  . Lung cancer Maternal Aunt   . Heart disease Other        parents/maternal grandparents/ 2 brothers  . Stroke Paternal Grandmother   . Mental retardation Sister   . Colon cancer Neg Hx   . Esophageal cancer Neg Hx   . Pancreatic cancer Neg Hx   . Stomach cancer Neg Hx   . Liver disease Neg Hx   . Colon polyps Neg Hx   . Rectal cancer Neg Hx     Social History:  reports that he has never smoked. He has never used smokeless tobacco. He reports current alcohol use. He reports that he does not use drugs.    Review of Systems    Lipid history: On Lipitor 40 mg, followed by PCP His triglycerides were nearly 410 previously and also taking Vascepa with excellent control  Also has significant history of CAD   Lab Results  Component Value Date   CHOL 99 04/26/2019   HDL 27.90 (L) 04/26/2019   LDLCALC 58 04/26/2019   LDLDIRECT 72.0 11/04/2016   TRIG 67.0  04/26/2019   CHOLHDL 4 04/26/2019            RENAL dysfunction: Followed by nephrologist, renal function is slightly better  Lab Results  Component Value Date   CREATININE 1.53 (H) 01/03/2020   CREATININE 1.77 (H) 09/27/2019   CREATININE 1.80 (H) 08/18/2019      Hypertension:taking 5 mg lisinopril with 25 mg metoprolol  Followed by cardiologist and blood pressure is usually normal  BP Readings from Last 3 Encounters:  01/05/20 120/70  01/03/20 140/83  12/08/19 110/60    Diabetic foot exam  in 6/21 shows normal monofilament sensation in the toes and plantar surfaces, no skin lesions or ulcers on the feet and absent pedal pulses    Physical Examination:  BP 120/70 (BP Location: Left Arm, Patient Position: Sitting, Cuff Size: Normal)   Pulse (!) 59   Ht 5\' 3"  (1.6 m)   Wt 139 lb 12.8 oz (63.4 kg)   SpO2 99%   BMI 24.76 kg/m   No pedal edema  Diabetic Foot Exam - Simple   Simple Foot Form Diabetic Foot exam was performed with the following findings: Yes 01/05/2020  9:38 AM  Visual Inspection No deformities, no ulcerations, no other skin breakdown bilaterally: Yes Sensation Testing Intact to touch and monofilament testing bilaterally: Yes Pulse Check See comments: Yes Comments Pedal pulses absent on the right      ASSESSMENT:  Diabetes type 2, with BMI 25; and abdominal obesity   See history of present illness for detailed discussion of current diabetes management, blood sugar patterns and problems identified  A1c is 6.8, was 7.2 Previously fructosamine was 253 and this may be more accurate because of renal insufficiency  His blood sugars are in the normal range in the morning and likely a little too low He says he felt shaky and had a glucose of 47 about a week ago at 4 AM Otherwise no hypoglycemia except for a reading of 60 before dinnertime Blood sugars are fairly close to target around 10 PM at night Taking Prandin with each meal  Also recently  trying to walk and eat healthy diet  RENAL insufficiency: Relatively better, followed by nephrologist Microalbumin normal in 2/21  Peripheral vascular  disease, stable with mostly symptoms on the right side   PLAN:  Stop Metformin Try to check more readings after meals and less in the morning on waking up No change in Prandin but he will let us know if he has any further hypoglycemia  Annual eye exams and microalbumin testing  Follow-up in 4 months     Patient Instructions  Check blood sugars on waking up days a week  Also check blood sugars about 2 hours after meals and do this after different meals by rotation  Recommended blood sugar levels on waking up are 90-130 and about 2 hours after meal is 130-160  Please bring your blood sugar monitor to each visit, thank you  Leave off Metformin      Elayne Snare 01/05/2020, 2:46 PM   Note: This office note was prepared with Dragon voice recognition system technology. Any transcriptional errors that result from this process are unintentional.

## 2020-01-05 NOTE — Patient Instructions (Addendum)
Check blood sugars on waking up days a week  Also check blood sugars about 2 hours after meals and do this after different meals by rotation  Recommended blood sugar levels on waking up are 90-130 and about 2 hours after meal is 130-160  Please bring your blood sugar monitor to each visit, thank you  Leave off Metformin

## 2020-01-05 NOTE — Telephone Encounter (Signed)
FAXED [place]: [facility]  Document: Diabetic testing supplies order form Other records requested: none at this time.  All above requested information has been faxed successfully to [employer] listed above. Documents and fax confirmation have been placed in the faxed file for future reference.

## 2020-01-09 NOTE — Progress Notes (Signed)
Subjective: [name] presents today at [facility]. Pt has h/o NIDDM with PAD and painful callus(es) left foot and painful mycotic toenails b/l that are difficult to trim. Pain interferes with ambulation. Aggravating factors include wearing enclosed shoe gear. Pain is relieved with periodic professional debridement.   Today, he relates continued pain of right hallux lateral border. He would like to know what other treatment options are available as it is really tender when he wears enclosed shoe gear. He denies any drainage, redness or swelling of digit.  Dorothyann Peng, NP is patient's PCP. Last visit was: 12/08/2019.  Past Medical History:  Diagnosis Date  . Absolute anemia 05/28/2014  . Aortic stenosis 09/17/2015  . Arthritis   . Ascites 11/18/2013  . BELLS PALSY 07/19/2010   Qualifier: Diagnosis of  By: [name], [name]    . CAD (coronary artery disease)    a. s/p CABG in 2003 b. s/p PCI to SVG-PDA in 07/2015 c. 11/2016: cath showing severe native CAD with patent LIMA-LAD and SVG-D1 with 80% stenosis of SVG-OM1-OM2. Initially medical management was recommended --> presented with recurrent angina --> s/p Synergy DES to proximal body of SVG-OM1-OM2, POBA to distal graft.   Marland Kitchen CAD- S/P PCI SVG-OM1 12/01/16 05/12/2006   Qualifier: Diagnosis of  By: [name], [name]    . Carotid bruit 07/10/2015  . Chest pain 11/22/2016  . Chronic kidney disease   . Chronic renal insufficiency, stage III (moderate) 12/02/2016  . Constipation 05/28/2014  . COPD 07/20/2008   Qualifier: Diagnosis of  By: [name], [name]   . DEPRESSION 09/03/2006   Qualifier: Diagnosis of  By: [name], [name]    . Dermatitis 11/27/2012  . Diabetes mellitus   . Diarrhea 11/20/2013  . DM (diabetes mellitus), type 2 with ophthalmic complications ([place]) 4/31/5400   Qualifier: Diagnosis of  By: [name], [name]    . Dry eye syndrome 12/20/2010  . Epicondylitis 06/05/2013   right  . Erectile dysfunction 01/01/2012  . Essential  hypertension 05/12/2006   Qualifier: Diagnosis of  By: [name], [name]    . GENITAL HERPES 05/03/2009   Qualifier: Diagnosis of  By: [name], [name]    . GERD 09/03/2006   Qualifier: Diagnosis of  By: [name], [name]    . GERD (gastroesophageal reflux disease)   . HEARING LOSS, SENSORINEURAL 05/12/2006   Qualifier: Diagnosis of  By: [name], [name]    . HEMATOCHEZIA 04/18/2008   Annotation: 9/09 during bout of constipation Qualifier: Diagnosis of  By: [name], [name]    . HIP PAIN, BILATERAL 07/17/2008   Qualifier: Diagnosis of  By: [name], [name]   . History of depression   . History of kidney stones   . HIV (human immunodeficiency virus infection) ([place]) 1991   on meds since initial dx.   Marland Kitchen HLD (hyperlipidemia) 11/19/2013  . Human immunodeficiency virus (HIV) disease ([place]) 05/12/2006   Qualifier: Diagnosis of  By: [name], [name]    . Hx of CABG 09/03/2006   Annotation: 2003 Qualifier: Diagnosis of  By: [name], [name]    . Hyperkalemia 03/23/2014  . Hyperlipidemia   . Hypertension   . Hyponatremia 03/23/2014  . INGUINAL LYMPHADENOPATHY, RIGHT 04/03/2009   Qualifier: Diagnosis of  By: [name], [name]    . Keratoma 01/24/2015  . KNEE PAIN, BILATERAL 07/17/2008   Qualifier: Diagnosis of  By: [name], [name]   . Lesion of breast 07/21/2015  . Lipodystrophy  12/20/2010  . Memory loss 09/18/2008   Qualifier: Diagnosis of  By: [name], [name]   . Metatarsal deformity 01/24/2015  . Nasal abscess 12/04/2015  . Night sweats 07/06/2012  . Nocturia 03/06/2013  . NSTEMI (non-ST elevated myocardial infarction) ([place]) 11/29/2016  . Pain in joint, ankle and foot 01/19/2015  . Pancreatitis 11/2013   attributed to HIV meds.   . Pedal edema 05/21/2014  . PERIPHERAL VASCULAR DISEASE 07/20/2008   Qualifier: Diagnosis of  By: [name], [name]   . Posterior cervical lymphadenopathy 03/30/2014  . Protein-calorie malnutrition, severe ([place]) 03/23/2014  . SHINGLES, HX OF 05/03/2009   Annotation: R  leg Qualifier: Diagnosis of  By: [name], [name]    . STEMI (ST elevation myocardial infarction) ([place]) 07/29/2015  . Unstable angina ([place]) 11/24/2016  . WEIGHT LOSS, ABNORMAL 04/03/2009   Qualifier: Diagnosis of  By: [name], [name]       Current Outpatient Medications on File Prior to Visit  Medication Sig Dispense Refill  . acetaminophen (TYLENOL) 325 MG tablet Take 2 tablets (650 mg total) by mouth every 4 (four) hours as needed for headache or mild pain.    Marland Kitchen aspirin 81 MG chewable tablet Chew 81 mg by mouth daily.    Marland Kitchen atorvastatin (LIPITOR) 20 MG tablet TAKE 1 TABLET(20 MG) BY MOUTH DAILY 90 tablet 1  . b complex vitamins tablet Take 1 tablet by mouth daily. 30 tablet 11  . Cholecalciferol 2000 units CAPS Take 1 capsule (2,000 Units total) by mouth daily. 30 each 11  . dolutegravir (TIVICAY) 50 MG tablet TAKE 1 TABLET BY MOUTH DAILY with Pifeltro 30 tablet 5  . doravirine (PIFELTRO) 100 MG TABS tablet TAKE 1 TABLET(100 MG) BY MOUTH DAILY with Tivicay 30 tablet 5  . fenofibrate 54 MG tablet TAKE 1 TABLET(54 MG) BY MOUTH DAILY 90 tablet 2  . furosemide (LASIX) 20 MG tablet Take 2 tablets (40 mg total) by mouth daily. 180 tablet 0  . glucose blood (FREESTYLE TEST STRIPS) test strip USE TO CHECK BLOOD SUGAR THREE TIMES DAILY 300 each 3  . isosorbide mononitrate (IMDUR) 30 MG 24 hr tablet TAKE 1 TABLET BY MOUTH DAILY 90 tablet 2  . Lancets (FREESTYLE) lancets Use as directed three times a day to check blood sugar.  DX E11.9 100 each 5  . lisinopril (ZESTRIL) 5 MG tablet TAKE 1 TABLET(5 MG) BY MOUTH DAILY 90 tablet 2  . metoprolol tartrate (LOPRESSOR) 25 MG tablet Take 0.5 tablets (12.5 mg total) by mouth 2 (two) times daily. NEED APPOINTMENT FOR FUTURE REFILL 60 tablet 1  . nitroGLYCERIN (NITROSTAT) 0.4 MG SL tablet PLACE 1 TABLET UNDER THE TONGUE EVERY 5 MINUTES AS NEEDED FOR CHEST PAIN 25 tablet 3  . pantoprazole (PROTONIX) 40 MG tablet TAKE 1 TABLET(40 MG) BY MOUTH DAILY 30 tablet 11   . pioglitazone (ACTOS) 15 MG tablet TAKE 1 TABLET(15 MG) BY MOUTH DAILY 30 tablet 3  . potassium chloride SA (KLOR-CON) 20 MEQ tablet TAKE 1 TABLET(20 MEQ) BY MOUTH THREE TIMES DAILY 270 tablet 1  . Probiotic Product (PROBIOTIC DAILY) CAPS Take 1 by mouth daily 30 capsule 11  . sodium bicarbonate 650 MG tablet Take 650 mg by mouth 3 (three) times daily.    . tamsulosin (FLOMAX) 0.4 MG CAPS capsule TK 1 C PO BID  11  . triamcinolone cream (KENALOG) 0.1 % apply twice daily as needed to dry, itchy skin  2  . valACYclovir (VALTREX) 500 MG tablet  TAKE 1 TABLET BY MOUTH DAILY 30 tablet 5  . VASCEPA 1 g capsule TAKE 2 CAPSULES(2 GRAMS) BY MOUTH TWICE DAILY 120 capsule 2   Current Facility-Administered Medications on File Prior to Visit  Medication Dose Route Frequency Provider Last Rate Last Admin  . 0.9 %  sodium chloride infusion  500 mL Intravenous Once Milus Banister, MD      . dextrose 5 % solution   Intravenous Continuous Milus Banister, MD         No Known Allergies  Objective: SHERI PROWS is a pleasant 74 y.o. y.o. Patient Race: American [place] or [place] Native [3]  male in NAD. AAO x 3.  There were no vitals filed for this visit.  Vascular Examination: Capillary refill time to digits <4 seconds b/l. Nonpalpable DP pulse(s) b/l lower extremities. Nonpalpable PT pulse(s) b/l lower extremities. Pedal hair absent b/l. Skin temperature gradient within normal limits b/l. No ischemia or signs of gangrene noted. No pain with calf compression b/l. No edema noted b/l.  Dermatological Examination: Pedal skin is thin shiny, atrophic bilaterally. No open wounds bilaterally. No interdigital macerations bilaterally. Toenails 1-5 b/l elongated, discolored, dystrophic, thickened, crumbly with subungual debris and tenderness to dorsal palpation. Incurvated nailplate lateral border(s) R hallux.  There is nail border hypertrophy. There is tenderness to palpation. Signs of infection are not  present.  Musculoskeletal: Normal muscle strength 5/5 to all lower extremity muscle groups bilaterally. No pain crepitus or joint limitation noted with ROM b/l. No gross bony deformities bilaterally. Patient ambulates independent of any assistive aids.  Neurological Examination: Protective sensation intact 5/5 intact bilaterally with 10g monofilament b/l. Vibratory sensation intact b/l.  Assessment: 1. Pain due to onychomycosis of toenails of both feet   2. Ingrown toenail without infection   3. Callus   4. Type II diabetes mellitus with peripheral circulatory disorder (HCC)   Plan: -Examined patient. -Toenails 1-5 b/l were debrided in length and girth with sterile nail nippers and dremel without iatrogenic bleeding.  -Patient to continue soft, supportive shoe gear daily. -Patient to report any pedal injuries to medical professional immediately. -Offending nail border debrided and curretaged R hallux utilizing sterile nail nipper and currette. Border(s) cleansed with alcohol and triple antibiotic ointment and band-aid applied. Dispensed written instructions for once daily epsom salt soaks for 7 days. We discussed treatment options of temporary partial nail avulsion to allow border to heal. I advised him a matrixectomy is not an options because it causes a chemical burn and he may not heal from that procedure. He relates understanding and would like to proceed with temporary nail avulsion in the next two weeks. -Patient/POA to call should there be question/concern in the interim.  Return in about 3 months (around 04/05/2020) for diabetic nail and callus trim/ Plavix.  Marzetta Board, DPM

## 2020-01-10 ENCOUNTER — Other Ambulatory Visit: Payer: Self-pay | Admitting: Family Medicine

## 2020-01-10 ENCOUNTER — Telehealth: Payer: Self-pay

## 2020-01-10 MED ORDER — FUROSEMIDE 20 MG PO TABS: 40.0000 mg | ORAL_TABLET | Freq: Every day | ORAL | 1 refills | Status: DC

## 2020-01-10 NOTE — Telephone Encounter (Signed)
FAXED [place]: Dr. Garlon Hatchet office  (faxed on 01/06/2020) Document: Request for retinopathy report Other records requested: none at this time.  All above requested information has been faxed successfully to [employer] listed above. Documents and fax confirmation have been placed in the faxed file for future reference.

## 2020-01-10 NOTE — Telephone Encounter (Signed)
Sent to the pharmacy by e-scribe. 

## 2020-01-16 ENCOUNTER — Other Ambulatory Visit: Payer: Self-pay

## 2020-01-16 MED ORDER — ISOSORBIDE MONONITRATE ER 30 MG PO TB24: 30.0000 mg | ORAL_TABLET | Freq: Every day | ORAL | 2 refills | Status: AC

## 2020-01-20 ENCOUNTER — Other Ambulatory Visit: Payer: Self-pay

## 2020-01-20 ENCOUNTER — Ambulatory Visit (HOSPITAL_COMMUNITY): Payer: Medicare Other | Attending: Cardiology

## 2020-01-20 DIAGNOSIS — I350 Nonrheumatic aortic (valve) stenosis: Secondary | ICD-10-CM | POA: Insufficient documentation

## 2020-01-25 ENCOUNTER — Ambulatory Visit: Payer: Medicare Other | Admitting: Podiatry

## 2020-02-12 ENCOUNTER — Other Ambulatory Visit: Payer: Self-pay | Admitting: Internal Medicine

## 2020-02-13 ENCOUNTER — Other Ambulatory Visit: Payer: Self-pay

## 2020-02-13 MED ORDER — METOPROLOL TARTRATE 25 MG PO TABS: 12.5000 mg | ORAL_TABLET | Freq: Two times a day (BID) | ORAL | 3 refills | Status: DC

## 2020-02-15 ENCOUNTER — Other Ambulatory Visit: Payer: Self-pay

## 2020-02-15 ENCOUNTER — Ambulatory Visit: Payer: Medicare Other

## 2020-02-16 ENCOUNTER — Encounter: Payer: Self-pay | Admitting: Internal Medicine

## 2020-03-06 ENCOUNTER — Other Ambulatory Visit: Payer: Self-pay

## 2020-03-06 ENCOUNTER — Other Ambulatory Visit: Payer: Medicare Other

## 2020-03-06 DIAGNOSIS — B20 Human immunodeficiency virus [HIV] disease: Secondary | ICD-10-CM | POA: Diagnosis not present

## 2020-03-07 LAB — T-HELPER CELL (CD4) - (RCID CLINIC ONLY)
CD4 % Helper T Cell: 48 % (ref 33–65)
CD4 T Cell Abs: 388 /uL — ABNORMAL LOW (ref 400–1790)

## 2020-03-12 LAB — HIV-1 RNA QUANT-NO REFLEX-BLD
HIV 1 RNA Quant: <20 Copies/mL
HIV-1 RNA Quant, Log: <1.3 Log cps/mL

## 2020-03-16 ENCOUNTER — Other Ambulatory Visit: Payer: Self-pay | Admitting: Endocrinology

## 2020-03-18 DIAGNOSIS — Z23 Encounter for immunization: Secondary | ICD-10-CM | POA: Diagnosis not present

## 2020-03-19 ENCOUNTER — Other Ambulatory Visit: Payer: Self-pay

## 2020-03-19 MED ORDER — NITROGLYCERIN 0.4 MG SL SUBL: SUBLINGUAL_TABLET | SUBLINGUAL | 3 refills | Status: AC

## 2020-03-20 ENCOUNTER — Ambulatory Visit (INDEPENDENT_AMBULATORY_CARE_PROVIDER_SITE_OTHER): Payer: Medicare Other | Admitting: Internal Medicine

## 2020-03-20 ENCOUNTER — Other Ambulatory Visit: Payer: Self-pay

## 2020-03-20 ENCOUNTER — Encounter: Payer: Self-pay | Admitting: Internal Medicine

## 2020-03-20 DIAGNOSIS — I2510 Atherosclerotic heart disease of native coronary artery without angina pectoris: Secondary | ICD-10-CM

## 2020-03-20 DIAGNOSIS — B20 Human immunodeficiency virus [HIV] disease: Secondary | ICD-10-CM

## 2020-03-20 NOTE — Assessment & Plan Note (Signed)
His infection remains under excellent, long-term control.  He will continue his current regimen and follow-up after lab work in 1 year.

## 2020-03-20 NOTE — Progress Notes (Signed)
Patient Active Problem List   Diagnosis Date Noted  . DM (diabetes mellitus), type 2 with ophthalmic complications ([place]) 38/46/6599    Priority: High  . Human immunodeficiency virus (HIV) disease ([place]) 05/12/2006    Priority: High  . CAD- S/P PCI SVG-OM1 12/01/16 05/12/2006    Priority: High  . Impingement syndrome of left shoulder region 07/08/2018  . Stiffness of left shoulder joint 07/08/2018  . Pain in joint of left shoulder 07/08/2018  . Chronic renal insufficiency, stage III (moderate) 12/02/2016  . NSTEMI (non-ST elevated myocardial infarction) ([place]) 11/29/2016  . Neck mass 06/16/2016  . Aortic stenosis 09/17/2015  . STEMI (ST elevation myocardial infarction) ([place]) 07/29/2015  . Lesion of breast 07/21/2015  . Carotid bruit 07/10/2015  . Metatarsal deformity 01/24/2015  . Keratoma 01/24/2015  . Pain in joint, ankle and foot 01/19/2015  . Absolute anemia 05/28/2014  . Pedal edema 05/21/2014  . Posterior cervical lymphadenopathy 03/30/2014  . Protein-calorie malnutrition, severe ([place]) 03/23/2014  . Diarrhea 11/20/2013  . HLD (hyperlipidemia) 11/19/2013  . Ascites 11/18/2013  . Pancreatitis 11/15/2013  . Epicondylitis 06/05/2013  . Nocturia 03/06/2013  . Dermatitis 11/27/2012  . Erectile dysfunction 01/01/2012  . Lipodystrophy 12/20/2010  . Dry eye syndrome 12/20/2010  . BELLS PALSY 07/19/2010  . GENITAL HERPES 05/03/2009  . SHINGLES, HX OF 05/03/2009  . WEIGHT LOSS, ABNORMAL 04/03/2009  . INGUINAL LYMPHADENOPATHY, RIGHT 04/03/2009  . MEMORY LOSS 09/18/2008  . PERIPHERAL VASCULAR DISEASE 07/20/2008  . COPD 07/20/2008  . HIP PAIN, BILATERAL 07/17/2008  . KNEE PAIN, BILATERAL 07/17/2008  . NEPHROLITHIASIS, HX OF 02/22/2008  . Depression, recurrent ([place]) 09/03/2006  . GERD 09/03/2006  . Hx of CABG 09/03/2006  . HEARING LOSS, SENSORINEURAL 05/12/2006  . Essential hypertension 05/12/2006  . Atherosclerosis of coronary artery 05/12/2006    Patient's  Medications  New Prescriptions   No medications on file  Previous Medications   ACETAMINOPHEN (TYLENOL) 325 MG TABLET    Take 2 tablets (650 mg total) by mouth every 4 (four) hours as needed for headache or mild pain.   ASPIRIN 81 MG CHEWABLE TABLET    Chew 81 mg by mouth daily.   ATORVASTATIN (LIPITOR) 20 MG TABLET    TAKE 1 TABLET(20 MG) BY MOUTH DAILY   B COMPLEX VITAMINS TABLET    Take 1 tablet by mouth daily.   CHOLECALCIFEROL 2000 UNITS CAPS    Take 1 capsule (2,000 Units total) by mouth daily.   DOLUTEGRAVIR (TIVICAY) 50 MG TABLET    TAKE 1 TABLET BY MOUTH DAILY with Pifeltro   DORAVIRINE (PIFELTRO) 100 MG TABS TABLET    TAKE 1 TABLET(100 MG) BY MOUTH DAILY with Tivicay   FENOFIBRATE 54 MG TABLET    TAKE 1 TABLET(54 MG) BY MOUTH DAILY   FUROSEMIDE (LASIX) 20 MG TABLET    Take 2 tablets (40 mg total) by mouth daily.   GLUCOSE BLOOD (FREESTYLE TEST STRIPS) TEST STRIP    USE TO CHECK BLOOD SUGAR THREE TIMES DAILY   ISOSORBIDE MONONITRATE (IMDUR) 30 MG 24 HR TABLET    Take 1 tablet (30 mg total) by mouth daily.   LANCETS (FREESTYLE) LANCETS    Use as directed three times a day to check blood sugar.  DX E11.9   LISINOPRIL (ZESTRIL) 5 MG TABLET    TAKE 1 TABLET(5 MG) BY MOUTH DAILY   METOPROLOL TARTRATE (LOPRESSOR) 25 MG TABLET    Take 0.5 tablets (12.5 mg total) by  mouth 2 (two) times daily. NEED APPOINTMENT FOR FUTURE REFILL   NITROGLYCERIN (NITROSTAT) 0.4 MG SL TABLET    PLACE 1 TABLET UNDER THE TONGUE EVERY 5 MINUTES AS NEEDED FOR CHEST PAIN   PANTOPRAZOLE (PROTONIX) 40 MG TABLET    TAKE 1 TABLET(40 MG) BY MOUTH DAILY   PIOGLITAZONE (ACTOS) 15 MG TABLET    TAKE 1 TABLET(15 MG) BY MOUTH DAILY   POTASSIUM CHLORIDE SA (KLOR-CON) 20 MEQ TABLET    TAKE 1 TABLET(20 MEQ) BY MOUTH THREE TIMES DAILY   PROBIOTIC PRODUCT (PROBIOTIC DAILY) CAPS    Take 1 by mouth daily   REPAGLINIDE (PRANDIN) 2 MG TABLET    Take 2 mg by mouth 3 (three) times daily before meals. Take 2 tablet by mouth daily before  breakfast and 1 tablet by mouth daily before lunch and dinner.   SODIUM BICARBONATE 650 MG TABLET    Take 650 mg by mouth 3 (three) times daily.   TAMSULOSIN (FLOMAX) 0.4 MG CAPS CAPSULE    TK 1 C PO BID   TRIAMCINOLONE CREAM (KENALOG) 0.1 %    apply twice daily as needed to dry, itchy skin   VALACYCLOVIR (VALTREX) 500 MG TABLET    TAKE 1 TABLET BY MOUTH DAILY   VASCEPA 1 G CAPSULE    TAKE 2 CAPSULES(2 GRAMS) BY MOUTH TWICE DAILY  Modified Medications   No medications on file  Discontinued Medications   No medications on file    Subjective: [name] is in for his routine HIV follow-up visit.  He denies any problems obtaining, taking or tolerating his Tivicay or Pifeltro.  He does not miss any doses.  He took the [employer] vaccine in the spring.  He is feeling well.  Review of Systems: Review of Systems  Constitutional: Negative for fever and weight loss.  Respiratory: Negative for cough and shortness of breath.   Cardiovascular: Negative for chest pain.  Gastrointestinal: Negative for nausea and vomiting.  Psychiatric/Behavioral: Negative for depression. The patient is not nervous/anxious.     Past Medical History:  Diagnosis Date  . Absolute anemia 05/28/2014  . Aortic stenosis 09/17/2015  . Arthritis   . Ascites 11/18/2013  . BELLS PALSY 07/19/2010   Qualifier: Diagnosis of  By: [name], [name]    . CAD (coronary artery disease)    a. s/p CABG in 2003 b. s/p PCI to SVG-PDA in 07/2015 c. 11/2016: cath showing severe native CAD with patent LIMA-LAD and SVG-D1 with 80% stenosis of SVG-OM1-OM2. Initially medical management was recommended --> presented with recurrent angina --> s/p Synergy DES to proximal body of SVG-OM1-OM2, POBA to distal graft.   Marland Kitchen CAD- S/P PCI SVG-OM1 12/01/16 05/12/2006   Qualifier: Diagnosis of  By: [name], Daphney Hopke    . Carotid bruit 07/10/2015  . Chest pain 11/22/2016  . Chronic kidney disease   . Chronic renal insufficiency, stage III (moderate) 12/02/2016  .  Constipation 05/28/2014  . COPD 07/20/2008   Qualifier: Diagnosis of  By: [name], [name]   . DEPRESSION 09/03/2006   Qualifier: Diagnosis of  By: [name], Dustie Brittle    . Dermatitis 11/27/2012  . Diabetes mellitus   . Diarrhea 11/20/2013  . DM (diabetes mellitus), type 2 with ophthalmic complications ([place]) 5/68/1275   Qualifier: Diagnosis of  By: [name], Zunaira Lamy    . Dry eye syndrome 12/20/2010  . Epicondylitis 06/05/2013   right  . Erectile dysfunction 01/01/2012  . Essential hypertension 05/12/2006   Qualifier: Diagnosis of  By: [name], Latondra Gebhart    . GENITAL HERPES 05/03/2009   Qualifier: Diagnosis of  By: [name], Malissia Rabbani    . GERD 09/03/2006   Qualifier: Diagnosis of  By: [name], Trixie Maclaren    . GERD (gastroesophageal reflux disease)   . HEARING LOSS, SENSORINEURAL 05/12/2006   Qualifier: Diagnosis of  By: [name], Carmelia Tiner    . HEMATOCHEZIA 04/18/2008   Annotation: 9/09 during bout of constipation Qualifier: Diagnosis of  By: [name], Izayiah Tibbitts    . HIP PAIN, BILATERAL 07/17/2008   Qualifier: Diagnosis of  By: [name], [name]   . History of depression   . History of kidney stones   . HIV (human immunodeficiency virus infection) ([place]) 1991   on meds since initial dx.   Marland Kitchen HLD (hyperlipidemia) 11/19/2013  . Human immunodeficiency virus (HIV) disease ([place]) 05/12/2006   Qualifier: Diagnosis of  By: [name], Ponciano Shealy    . Hx of CABG 09/03/2006   Annotation: 2003 Qualifier: Diagnosis of  By: [name], Elisa Sorlie    . Hyperkalemia 03/23/2014  . Hyperlipidemia   . Hypertension   . Hyponatremia 03/23/2014  . INGUINAL LYMPHADENOPATHY, RIGHT 04/03/2009   Qualifier: Diagnosis of  By: [name], Corleone Biegler    . Keratoma 01/24/2015  . KNEE PAIN, BILATERAL 07/17/2008   Qualifier: Diagnosis of  By: [name], [name]   . Lesion of breast 07/21/2015  . Lipodystrophy 12/20/2010  . Memory loss 09/18/2008   Qualifier: Diagnosis of  By: [name], [name]   . Metatarsal deformity 01/24/2015  . Nasal abscess  12/04/2015  . Night sweats 07/06/2012  . Nocturia 03/06/2013  . NSTEMI (non-ST elevated myocardial infarction) ([place]) 11/29/2016  . Pain in joint, ankle and foot 01/19/2015  . Pancreatitis 11/2013   attributed to HIV meds.   . Pedal edema 05/21/2014  . PERIPHERAL VASCULAR DISEASE 07/20/2008   Qualifier: Diagnosis of  By: [name], [name]   . Posterior cervical lymphadenopathy 03/30/2014  . Protein-calorie malnutrition, severe ([place]) 03/23/2014  . SHINGLES, HX OF 05/03/2009   Annotation: R leg Qualifier: Diagnosis of  By: [name], Lenda Baratta    . STEMI (ST elevation myocardial infarction) ([place]) 07/29/2015  . Unstable angina ([place]) 11/24/2016  . WEIGHT LOSS, ABNORMAL 04/03/2009   Qualifier: Diagnosis of  By: [name], Alianah Lofton      Social History   Tobacco Use  . Smoking status: Never Smoker  . Smokeless tobacco: Never Used  Substance Use Topics  . Alcohol use: Not Currently    Alcohol/week: 0.0 standard drinks    Comment: rare  . Drug use: No    Family History  Problem Relation Age of Onset  . Hyperlipidemia Mother   . Diabetes Mother        paternal grandparents/1 brother  . Hyperlipidemia Father   . Hypertension Father        paternal grandmother/3 brothers/1 sister  . Arthritis Other        mother/father/paternal grandparents  . Breast cancer Maternal Aunt        paternal aunt  . Lung cancer Maternal Aunt   . Heart disease Other        parents/maternal grandparents/ 2 brothers  . Stroke Paternal Grandmother   . Mental retardation Sister   . Colon cancer Neg Hx   . Esophageal cancer Neg Hx   . Pancreatic cancer Neg Hx   . Stomach cancer Neg Hx   . Liver disease Neg Hx   .  Colon polyps Neg Hx   . Rectal cancer Neg Hx     No Known Allergies  Health Maintenance  Topic Date Due  . INFLUENZA VACCINE  03/04/2020  . OPHTHALMOLOGY EXAM  06/06/2020  . HEMOGLOBIN A1C  07/04/2020  . COLONOSCOPY  12/01/2020  . FOOT EXAM  01/04/2021  . TETANUS/TDAP  04/12/2029  . COVID-19 Vaccine   Completed  . Hepatitis C Screening  Completed  . PNA vac Low Risk Adult  Completed    Objective:  Vitals:   03/20/20 1048  BP: 111/63  Pulse: 77  Temp: 98.2 F (36.8 C)  TempSrc: Oral  Weight: 137 lb (62.1 kg)   Body mass index is 24.27 kg/m.  Physical Exam Constitutional:      Comments: He is in good spirits.  Cardiovascular:     Rate and Rhythm: Normal rate and regular rhythm.     Heart sounds: Murmur heard.      Comments: 1/6 systolic murmur heard best at the left upper sternal border. Pulmonary:     Effort: Pulmonary effort is normal.     Breath sounds: Normal breath sounds.  Abdominal:     Palpations: Abdomen is soft.     Tenderness: There is no abdominal tenderness.  Psychiatric:        Mood and Affect: Mood normal.     Lab Results Lab Results  Component Value Date   WBC 4.0 08/18/2019   HGB 12.7 (L) 08/18/2019   HCT 37.4 (L) 08/18/2019   MCV 95.2 08/18/2019   PLT 157 08/18/2019    Lab Results  Component Value Date   CREATININE 1.53 (H) 01/03/2020   BUN 19 01/03/2020   NA 141 01/03/2020   K 4.5 01/03/2020   CL 108 01/03/2020   CO2 26 01/03/2020    Lab Results  Component Value Date   ALT 29 01/03/2020   AST 23 01/03/2020   ALKPHOS 46 01/03/2020   BILITOT 0.4 01/03/2020    Lab Results  Component Value Date   CHOL 99 04/26/2019   HDL 27.90 (L) 04/26/2019   LDLCALC 58 04/26/2019   LDLDIRECT 72.0 11/04/2016   TRIG 67.0 04/26/2019   CHOLHDL 4 04/26/2019   Lab Results  Component Value Date   LABRPR NON-REACTIVE 08/18/2019   HIV 1 RNA Quant  Date Value  03/06/2020 <20 Copies/mL  08/18/2019 <20 NOT DETECTED copies/mL  02/17/2019 <20 DETECTED copies/mL (A)   HIV-1 RNA Viral Load (no units)  Date Value  07/14/2013 <40  04/13/2013 <40  01/18/2013 <40   CD4 (no units)  Date Value  07/14/2013 514  04/13/2013 498  01/18/2013 522   CD4 T Cell Abs (/uL)  Date Value  03/06/2020 388 (L)  08/18/2019 372 (L)  02/17/2019 358 (L)       Problem List Items Addressed This Visit      High   Human immunodeficiency virus (HIV) disease ([place])    His infection remains under excellent, long-term control.  He will continue his current regimen and follow-up after lab work in 1 year.      Relevant Orders   CBC   T-helper cell (CD4)- ([facility] clinic only)   Comprehensive metabolic panel   RPR   HIV-1 RNA quant-no reflex-bld        Michel Bickers, MD [facility] [place] [phone] pager   [phone] cell 03/20/2020, 11:03 AM

## 2020-03-27 ENCOUNTER — Other Ambulatory Visit: Payer: Self-pay | Admitting: Internal Medicine

## 2020-03-27 DIAGNOSIS — B029 Zoster without complications: Secondary | ICD-10-CM

## 2020-03-30 ENCOUNTER — Ambulatory Visit (INDEPENDENT_AMBULATORY_CARE_PROVIDER_SITE_OTHER): Payer: Medicare Other

## 2020-03-30 ENCOUNTER — Other Ambulatory Visit: Payer: Self-pay

## 2020-03-30 ENCOUNTER — Encounter: Payer: Self-pay | Admitting: *Deleted

## 2020-03-30 DIAGNOSIS — Z0000 Encounter for general adult medical examination without abnormal findings: Secondary | ICD-10-CM

## 2020-03-30 NOTE — Progress Notes (Signed)
Subjective:   [name] is a 74 y.o. male who presents for Medicare Annual/Subsequent preventive examination.  I connected with [name] today by telephone and verified that I am speaking with the correct person using two identifiers. Location patient: home Location provider: work Persons participating in the virtual visit: patient, provider.   I discussed the limitations, risks, security and privacy concerns of performing an evaluation and management service by telephone and the availability of in person appointments. I also discussed with the patient that there may be a patient responsible charge related to this service. The patient expressed understanding and verbally consented to this telephonic visit.    Interactive audio and video telecommunications were attempted between this provider and patient, however failed, due to patient having technical difficulties OR patient did not have access to video capability.  We continued and completed visit with audio only.      Review of Systems    N/A Cardiac Risk Factors include: advanced age (>27men, >5 women);diabetes mellitus;male gender;hypertension;dyslipidemia     Objective:    Today's Vitals   There is no height or weight on file to calculate BMI.  Advanced Directives 03/30/2020 12/23/2017 10/21/2017 10/20/2017 12/01/2016 11/29/2016 11/22/2016  Does Patient Have a Medical Advance Directive? No No - No - No No  Would patient like information on creating a medical advance directive? No - Patient declined - No - Patient declined - No - Patient declined - No - Patient declined  Pre-existing out of facility DNR order (yellow form or pink MOST form) - - - - - - -    Current Medications (verified) Outpatient Encounter Medications as of 03/30/2020  Medication Sig  . acetaminophen (TYLENOL) 325 MG tablet Take 2 tablets (650 mg total) by mouth every 4 (four) hours as needed for headache or mild pain.  Marland Kitchen aspirin 81 MG chewable tablet  Chew 81 mg by mouth daily.  Marland Kitchen atorvastatin (LIPITOR) 20 MG tablet TAKE 1 TABLET(20 MG) BY MOUTH DAILY  . b complex vitamins tablet Take 1 tablet by mouth daily.  . Cholecalciferol 2000 units CAPS Take 1 capsule (2,000 Units total) by mouth daily.  . dolutegravir (TIVICAY) 50 MG tablet TAKE 1 TABLET BY MOUTH DAILY with Pifeltro  . doravirine (PIFELTRO) 100 MG TABS tablet TAKE 1 TABLET(100 MG) BY MOUTH DAILY with Tivicay  . fenofibrate 54 MG tablet TAKE 1 TABLET(54 MG) BY MOUTH DAILY  . furosemide (LASIX) 20 MG tablet Take 2 tablets (40 mg total) by mouth daily.  Marland Kitchen glucose blood (FREESTYLE TEST STRIPS) test strip USE TO CHECK BLOOD SUGAR THREE TIMES DAILY  . isosorbide mononitrate (IMDUR) 30 MG 24 hr tablet Take 1 tablet (30 mg total) by mouth daily.  . Lancets (FREESTYLE) lancets Use as directed three times a day to check blood sugar.  DX E11.9  . lisinopril (ZESTRIL) 5 MG tablet TAKE 1 TABLET(5 MG) BY MOUTH DAILY  . metoprolol tartrate (LOPRESSOR) 25 MG tablet Take 0.5 tablets (12.5 mg total) by mouth 2 (two) times daily. NEED APPOINTMENT FOR FUTURE REFILL  . nitroGLYCERIN (NITROSTAT) 0.4 MG SL tablet PLACE 1 TABLET UNDER THE TONGUE EVERY 5 MINUTES AS NEEDED FOR CHEST PAIN  . pantoprazole (PROTONIX) 40 MG tablet TAKE 1 TABLET(40 MG) BY MOUTH DAILY  . pioglitazone (ACTOS) 15 MG tablet TAKE 1 TABLET(15 MG) BY MOUTH DAILY  . potassium chloride SA (KLOR-CON) 20 MEQ tablet TAKE 1 TABLET(20 MEQ) BY MOUTH THREE TIMES DAILY  . Probiotic Product (PROBIOTIC DAILY) CAPS Take  1 by mouth daily  . repaglinide (PRANDIN) 2 MG tablet Take 2 mg by mouth 3 (three) times daily before meals. Take 2 tablet by mouth daily before breakfast and 1 tablet by mouth daily before lunch and dinner.  . sodium bicarbonate 650 MG tablet Take 650 mg by mouth 3 (three) times daily.  . tamsulosin (FLOMAX) 0.4 MG CAPS capsule TK 1 C PO BID  . triamcinolone cream (KENALOG) 0.1 % apply twice daily as needed to dry, itchy skin  .  valACYclovir (VALTREX) 500 MG tablet TAKE 1 TABLET BY MOUTH DAILY  . VASCEPA 1 g capsule TAKE 2 CAPSULES(2 GRAMS) BY MOUTH TWICE DAILY   Facility-Administered Encounter Medications as of 03/30/2020  Medication  . 0.9 %  sodium chloride infusion  . dextrose 5 % solution    Allergies (verified) Patient has no known allergies.   History: Past Medical History:  Diagnosis Date  . Absolute anemia 05/28/2014  . Aortic stenosis 09/17/2015  . Arthritis   . Ascites 11/18/2013  . BELLS PALSY 07/19/2010   Qualifier: Diagnosis of  By: [name], [name]    . CAD (coronary artery disease)    a. s/p CABG in 2003 b. s/p PCI to SVG-PDA in 07/2015 c. 11/2016: cath showing severe native CAD with patent LIMA-LAD and SVG-D1 with 80% stenosis of SVG-OM1-OM2. Initially medical management was recommended --> presented with recurrent angina --> s/p Synergy DES to proximal body of SVG-OM1-OM2, POBA to distal graft.   Marland Kitchen CAD- S/P PCI SVG-OM1 12/01/16 05/12/2006   Qualifier: Diagnosis of  By: [name], [name]    . Carotid bruit 07/10/2015  . Chest pain 11/22/2016  . Chronic kidney disease   . Chronic renal insufficiency, stage III (moderate) 12/02/2016  . Constipation 05/28/2014  . COPD 07/20/2008   Qualifier: Diagnosis of  By: [name], [name]   . DEPRESSION 09/03/2006   Qualifier: Diagnosis of  By: [name], [name]    . Dermatitis 11/27/2012  . Diabetes mellitus   . Diarrhea 11/20/2013  . DM (diabetes mellitus), type 2 with ophthalmic complications ([place]) 11/03/270   Qualifier: Diagnosis of  By: [name], [name]    . Dry eye syndrome 12/20/2010  . Epicondylitis 06/05/2013   right  . Erectile dysfunction 01/01/2012  . Essential hypertension 05/12/2006   Qualifier: Diagnosis of  By: [name], [name]    . GENITAL HERPES 05/03/2009   Qualifier: Diagnosis of  By: [name], [name]    . GERD 09/03/2006   Qualifier: Diagnosis of  By: [name], [name]    . GERD (gastroesophageal reflux disease)   . HEARING LOSS,  SENSORINEURAL 05/12/2006   Qualifier: Diagnosis of  By: [name], [name]    . HEMATOCHEZIA 04/18/2008   Annotation: 9/09 during bout of constipation Qualifier: Diagnosis of  By: [name], [name]    . HIP PAIN, BILATERAL 07/17/2008   Qualifier: Diagnosis of  By: [name], [name]   . History of depression   . History of kidney stones   . HIV (human immunodeficiency virus infection) ([place]) 1991   on meds since initial dx.   Marland Kitchen HLD (hyperlipidemia) 11/19/2013  . Human immunodeficiency virus (HIV) disease ([place]) 05/12/2006   Qualifier: Diagnosis of  By: [name], [name]    . Hx of CABG 09/03/2006   Annotation: 2003 Qualifier: Diagnosis of  By: [name], [name]    . Hyperkalemia 03/23/2014  . Hyperlipidemia   . Hypertension   . Hyponatremia 03/23/2014  . INGUINAL  LYMPHADENOPATHY, RIGHT 04/03/2009   Qualifier: Diagnosis of  By: [name], [name]    . Keratoma 01/24/2015  . KNEE PAIN, BILATERAL 07/17/2008   Qualifier: Diagnosis of  By: [name], [name]   . Lesion of breast 07/21/2015  . Lipodystrophy 12/20/2010  . Memory loss 09/18/2008   Qualifier: Diagnosis of  By: [name], [name]   . Metatarsal deformity 01/24/2015  . Nasal abscess 12/04/2015  . Night sweats 07/06/2012  . Nocturia 03/06/2013  . NSTEMI (non-ST elevated myocardial infarction) ([place]) 11/29/2016  . Pain in joint, ankle and foot 01/19/2015  . Pancreatitis 11/2013   attributed to HIV meds.   . Pedal edema 05/21/2014  . PERIPHERAL VASCULAR DISEASE 07/20/2008   Qualifier: Diagnosis of  By: [name], [name]   . Posterior cervical lymphadenopathy 03/30/2014  . Protein-calorie malnutrition, severe ([place]) 03/23/2014  . SHINGLES, HX OF 05/03/2009   Annotation: R leg Qualifier: Diagnosis of  By: [name], [name]    . STEMI (ST elevation myocardial infarction) ([place]) 07/29/2015  . Unstable angina ([place]) 11/24/2016  . WEIGHT LOSS, ABNORMAL 04/03/2009   Qualifier: Diagnosis of  By: [name], [name]     Past Surgical History:  Procedure Laterality  Date  . CARDIAC CATHETERIZATION N/A 07/29/2015   Procedure: Left Heart Cath and Coronary Angiography;  Surgeon: [name], MD;  Location: [facility];  Service: Cardiovascular;  Laterality: N/A;  . CARDIAC CATHETERIZATION N/A 07/29/2015   Procedure: Coronary Stent Intervention;  Surgeon: [name], MD;  Location: [facility];  Service: Cardiovascular;  Laterality: N/A;  . COLONOSCOPY  12/02/2019  . CORONARY ARTERY BYPASS GRAFT  05/2002  . CORONARY STENT INTERVENTION N/A 12/01/2016   Procedure: Coronary Stent Intervention;  Surgeon: [name], MD;  Location: [facility];  Service: Cardiovascular;  Laterality: N/A;  . CORONARY STENT INTERVENTION N/A 10/21/2017   Procedure: CORONARY STENT INTERVENTION;  Surgeon: Jettie Booze, MD;  Location: [facility];  Service: Cardiovascular;  Laterality: N/A;  . LEFT HEART CATH AND CORS/GRAFTS ANGIOGRAPHY N/A 11/24/2016   Procedure: Left Heart Cath and Cors/Grafts Angiography;  Surgeon: Nelva Bush, MD;  Location: [facility];  Service: Cardiovascular;  Laterality: N/A;  . LEFT HEART CATH AND CORS/GRAFTS ANGIOGRAPHY N/A 12/01/2016   Procedure: Left Heart Cath and Cors/Grafts Angiography;  Surgeon: [name], MD;  Location: [facility];  Service: Cardiovascular;  Laterality: N/A;  . LEFT HEART CATH AND CORS/GRAFTS ANGIOGRAPHY N/A 10/21/2017   Procedure: LEFT HEART CATH AND CORS/GRAFTS ANGIOGRAPHY;  Surgeon: Jettie Booze, MD;  Location: [facility];  Service: Cardiovascular;  Laterality: N/A;  . VASECTOMY     Family History  Problem Relation Age of Onset  . Hyperlipidemia Mother   . Diabetes Mother        paternal grandparents/1 brother  . Hyperlipidemia Father   . Hypertension Father        paternal grandmother/3 brothers/1 sister  . Arthritis Other        mother/father/paternal grandparents  . Breast cancer Maternal Aunt        paternal aunt  . Lung cancer Maternal  Aunt   . Heart disease Other        parents/maternal grandparents/ 2 brothers  . Stroke Paternal Grandmother   . Mental retardation Sister   . Colon cancer Neg Hx   . Esophageal cancer Neg Hx   . Pancreatic cancer Neg Hx   .  Stomach cancer Neg Hx   . Liver disease Neg Hx   . Colon polyps Neg Hx   . Rectal cancer Neg Hx    Social History   Socioeconomic History  . Marital status: Divorced    Spouse name: Not on file  . Number of children: 4  . Years of education: Not on file  . Highest education level: Not on file  Occupational History  . 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   Tobacco Use  . Smoking status: Never Smoker  . Smokeless tobacco: Never Used  Substance and Sexual Activity  . Alcohol use: Not Currently    Alcohol/week: 0.0 standard drinks    Comment: rare  . Drug use: No  . Sexual activity: Not Currently    Comment: declined condoms  Other Topics Concern  . Not on file  Social History Narrative   Lives alone.  Supportive friends and family.  His HIV Dx is not a secret.    Social Determinants of Health   Financial Resource Strain: Low Risk   . Difficulty of Paying Living Expenses: Not hard at all  Food Insecurity: No Food Insecurity  . Worried About Charity fundraiser in the Last Year: Never true  . Ran Out of Food in the Last Year: Never true  Transportation Needs: No Transportation Needs  . Lack of Transportation (Medical): No  . Lack of Transportation (Non-Medical): No  Physical Activity: Sufficiently Active  . Days of Exercise per Week: 7 days  . Minutes of Exercise per Session: 40 min  Stress: No Stress Concern Present  . Feeling of Stress : Not at all  Social Connections: Moderately Isolated  . Frequency of Communication with Friends and Family: More than three times a week  . Frequency of Social Gatherings with Friends and Family: Three times a week  . Attends Religious Services: More  than 4 times per year  . Active Member of Clubs or Organizations: No  . Attends Archivist Meetings: Never  . Marital Status: Divorced    Tobacco Counseling Counseling given: Not Answered   Clinical Intake:  Pre-visit preparation completed: Yes  Pain : No/denies pain     Nutritional Risks: None Diabetes: Yes (Patient states he checks his glucose every couple of days) CBG done?: No Did pt. bring in CBG monitor from home?: No  How often do you need to have someone help you when you read instructions, pamphlets, or other written materials from your doctor or pharmacy?: 1 - Never What is the last grade level you completed in school?: 12th grade  Diabetic?Yes  Interpreter Needed?: No  Information entered by :: [place] of Daily Living In your present state of health, do you have any difficulty performing the following activities: 03/30/2020  Hearing? Y  Comment Has no hearing in right ear at all and some loss in left ear  Vision? N  Difficulty concentrating or making decisions? N  Walking or climbing stairs? Y  Comment has arthritis and has issues if it is a lot of steps  Dressing or bathing? N  Doing errands, shopping? N  Preparing Food and eating ? N  Using the Toilet? N  In the past six months, have you accidently leaked urine? Y  Comment Has some issues but states not that bad  Do you have problems with loss of bowel control? Y  Comment has some issues but states not  that bad  Managing your Medications? N  Managing your Finances? N  Housekeeping or managing your Housekeeping? N  Some recent data might be hidden    Patient Care Team: Dorothyann Peng, NP as PCP - General (Family Medicine) Michel Bickers, MD as PCP - Infectious Diseases (Infectious Diseases) Stanford Breed Denice Bors, MD as PCP - Cardiology (Cardiology) Royann Shivers, MD as Referring Physician (Nephrology) Elayne Snare, MD as Consulting Physician (Endocrinology) Milus Banister, MD as Attending Physician (Gastroenterology) Lelon Perla, MD as Consulting Physician (Cardiology) Joseph Art, OD as Referring Physician (Optometry) Festus Aloe, MD as Consulting Physician (Urology) Justice Britain, MD (Orthopedic Surgery)  Indicate any recent Medical Services you may have received from other than [facility] providers in the past year (date may be approximate).     Assessment:   This is a routine wellness examination for [name].  Hearing/Vision screen  Hearing Screening   125Hz  250Hz  500Hz  1000Hz  2000Hz  3000Hz  4000Hz  6000Hz  8000Hz   Right ear:           Left ear:           Vision Screening Comments: Patient states gets eyes checked annually   Dietary issues and exercise activities discussed: Current Exercise Habits: Home exercise routine, Type of exercise: walking, Time (Minutes): 40, Frequency (Times/Week): 7, Weekly Exercise (Minutes/Week): 280, Intensity: Mild  Goals    .  Continue to exercise regularly.   (pt-stated)    .  Patient Stated      I will continue to walk a quarter of a mile each day      Depression Screen PHQ 2/9 Scores 03/30/2020 03/20/2020 08/19/2018 01/15/2018 12/23/2017 07/23/2017 10/14/2016  PHQ - 2 Score 1 0 0 2 0 2 2  PHQ- 9 Score 6 - - 12 - 2 3    Fall Risk Fall Risk  03/30/2020 03/20/2020 09/07/2019 08/19/2018 01/15/2018  Falls in the past year? 0 0 (No Data) 0 No  Comment - - not done - evisit - -  Number falls in past yr: 0 - - - -  Injury with Fall? 0 - - - -  Risk for fall due to : Medication side effect - Other (Comment) - -  Risk for fall due to: Comment - - - - -  Follow up Falls evaluation completed;Falls prevention discussed - - - -    Any stairs in or around the home? No  If so, are there any without handrails? No  Home free of loose throw rugs in walkways, pet beds, electrical cords, etc? Yes  Adequate lighting in your home to reduce risk of falls? Yes   ASSISTIVE DEVICES UTILIZED TO PREVENT FALLS:  Life  alert? No  Use of a cane, walker or w/c? No  Grab bars in the bathroom? Yes  Shower chair or bench in shower? No  Elevated toilet seat or a handicapped toilet? No    Cognitive Function: MMSE - Mini Mental State Exam 12/23/2017 10/14/2016 10/12/2015  Not completed: (No Data) - -  Orientation to time - 4 5  Orientation to Place - 5 5  Registration - 3 3  Attention/ Calculation - 4 5  Recall - 2 3  Language- name 2 objects - 2 2  Language- repeat - 1 1  Language- follow 3 step command - 3 3  Language- read & follow direction - 1 1  Write a sentence - 1 1  Copy design - 1 1  Total score - 27 30  6CIT Screen 03/30/2020  What Year? 0 points  What month? 0 points  What time? 0 points  Count back from 20 0 points  Months in reverse 0 points  Repeat phrase 2 points  Total Score 2    Immunizations Immunization History  Administered Date(s) Administered  . Fluad Quad(high Dose 65+) 05/18/2019  . H1N1 06/09/2008  . Influenza Split 05/01/2011, 05/18/2012  . Influenza Whole 09/23/2004, 06/01/2007, 05/03/2008, 05/03/2009, 04/10/2010  . Influenza, High Dose Seasonal PF 06/02/2013, 05/14/2016, 05/19/2017, 04/30/2018  . Influenza,inj,Quad PF,6+ Mos 05/03/2014, 04/24/2015  . Influenza,inj,quad, With Preservative 04/13/2018  . PFIZER SARS-COV-2 Vaccination 08/26/2019, 09/18/2019  . PPD Test 12/04/2009  . Pneumococcal Conjugate-13 10/14/2016  . Pneumococcal Polysaccharide-23 02/02/2007, 07/04/2010, 04/24/2015  . Td 11/24/2005  . Tdap 12/04/2015, 04/13/2019    TDAP status: Up to date Flu Vaccine status: Up to date Pneumococcal vaccine status: Up to date Covid-19 vaccine status: Completed vaccines  Qualifies for Shingles Vaccine? Yes   Zostavax completed No   Shingrix Completed?: No.    Education has been provided regarding the importance of this vaccine. Patient has been advised to call insurance company to determine out of pocket expense if they have not yet received this  vaccine. Advised may also receive vaccine at local pharmacy or Health Dept. Verbalized acceptance and understanding.  Screening Tests Health Maintenance  Topic Date Due  . INFLUENZA VACCINE  03/04/2020  . OPHTHALMOLOGY EXAM  06/06/2020  . HEMOGLOBIN A1C  07/04/2020  . COLONOSCOPY  12/01/2020  . FOOT EXAM  01/04/2021  . TETANUS/TDAP  04/12/2029  . COVID-19 Vaccine  Completed  . Hepatitis C Screening  Completed  . PNA vac Low Risk Adult  Completed    Health Maintenance  Health Maintenance Due  Topic Date Due  . INFLUENZA VACCINE  03/04/2020    Colorectal cancer screening: Completed 12/02/2019. Repeat every 10 years  Lung Cancer Screening: (Low Dose CT Chest recommended if Age 72-80 years, 30 pack-year currently smoking OR have quit w/in 15years.) does not qualify.   Lung Cancer Screening Referral: N/A  Additional Screening:  Hepatitis C Screening: does qualify; Completed 09/29/2012  Vision Screening: Recommended annual ophthalmology exams for early detection of glaucoma and other disorders of the eye. Is the patient up to date with their annual eye exam?  Yes  Who is the provider or what is the name of the office in which the patient attends annual eye exams? [facility] If pt is not established with a provider, would they like to be referred to a provider to establish care? No .   Dental Screening: Recommended annual dental exams for proper oral hygiene  Community Resource Referral / Chronic Care Management: CRR required this visit?  No   CCM required this visit?  No      Plan:     I have personally reviewed and noted the following in the patient's chart:   . Medical and social history . Use of alcohol, tobacco or illicit drugs  . Current medications and supplements . Functional ability and status . Nutritional status . Physical activity . Advanced directives . List of other physicians . Hospitalizations, surgeries, and ER visits in previous  12 months . Vitals . Screenings to include cognitive, depression, and falls . Referrals and appointments  In addition, I have reviewed and discussed with patient certain preventive protocols, quality metrics, and best practice recommendations. A written personalized care plan for preventive services as well as general preventive health recommendations were provided to patient.  Ofilia Neas, LPN   10/03/7207   Nurse Notes: Patient requested a referral to and audiologist for hearing aids due to hearing loss.

## 2020-03-30 NOTE — Patient Instructions (Addendum)
[name] , Thank you for taking time to come for your Medicare Wellness Visit. I appreciate your ongoing commitment to your health goals. Please review the following plan we discussed and let me know if I can assist you in the future.   Screening recommendations/referrals: Colonoscopy: Up to date, next due 11/30/2029 Recommended yearly ophthalmology/optometry visit for glaucoma screening and checkup Recommended yearly dental visit for hygiene and checkup  Vaccinations: Influenza vaccine: Up to date, next due this fall 2021 Pneumococcal vaccine: Completed series Tdap vaccine: Up to date, next due 04/12/2029 Shingles vaccine: Not indicated  Advanced directives: Advance directive discussed with you today. Even though you declined this today please call our office should you change your mind and we can give you the proper paperwork for you to fill out.   Conditions/risks identified: None   Next appointment: 04/02/2021 @ 10:30 am with [place] for Medicare Wellness visit.  Preventive Care 32 Years and Older, Male Preventive care refers to lifestyle choices and visits with your health care provider that can promote health and wellness. What does preventive care include?  A yearly physical exam. This is also called an annual well check.  Dental exams once or twice a year.  Routine eye exams. Ask your health care provider how often you should have your eyes checked.  Personal lifestyle choices, including:  Daily care of your teeth and gums.  Regular physical activity.  Eating a healthy diet.  Avoiding tobacco and drug use.  Limiting alcohol use.  Practicing safe sex.  Taking low doses of aspirin every day.  Taking vitamin and mineral supplements as recommended by your health care provider. What happens during an annual well check? The services and screenings done by your health care provider during your annual well check will depend on your age, overall  health, lifestyle risk factors, and family history of disease. Counseling  Your health care provider may ask you questions about your:  Alcohol use.  Tobacco use.  Drug use.  Emotional well-being.  Home and relationship well-being.  Sexual activity.  Eating habits.  History of falls.  Memory and ability to understand (cognition).  Work and work Statistician. Screening  You may have the following tests or measurements:  Height, weight, and BMI.  Blood pressure.  Lipid and cholesterol levels. These may be checked every 5 years, or more frequently if you are over 86 years old.  Skin check.  Lung cancer screening. You may have this screening every year starting at age 36 if you have a 30-pack-year history of smoking and currently smoke or have quit within the past 15 years.  Fecal occult blood test (FOBT) of the stool. You may have this test every year starting at age 73.  Flexible sigmoidoscopy or colonoscopy. You may have a sigmoidoscopy every 5 years or a colonoscopy every 10 years starting at age 82.  Prostate cancer screening. Recommendations will vary depending on your family history and other risks.  Hepatitis C blood test.  Hepatitis B blood test.  Sexually transmitted disease (STD) testing.  Diabetes screening. This is done by checking your blood sugar (glucose) after you have not eaten for a while (fasting). You may have this done every 1-3 years.  Abdominal aortic aneurysm (AAA) screening. You may need this if you are a current or former smoker.  Osteoporosis. You may be screened starting at age 38 if you are at high risk. Talk with your health care provider about your test results, treatment options,  and if necessary, the need for more tests. Vaccines  Your health care provider may recommend certain vaccines, such as:  Influenza vaccine. This is recommended every year.  Tetanus, diphtheria, and acellular pertussis (Tdap, Td) vaccine. You may need a Td  booster every 10 years.  Zoster vaccine. You may need this after age 49.  Pneumococcal 13-valent conjugate (PCV13) vaccine. One dose is recommended after age 45.  Pneumococcal polysaccharide (PPSV23) vaccine. One dose is recommended after age 31. Talk to your health care provider about which screenings and vaccines you need and how often you need them. This information is not intended to replace advice given to you by your health care provider. Make sure you discuss any questions you have with your health care provider. Document Released: 08/17/2015 Document Revised: 04/09/2016 Document Reviewed: 05/22/2015 Elsevier Interactive Patient Education  2017 [place] Prevention in the Home Falls can cause injuries. They can happen to people of all ages. There are many things you can do to make your home safe and to help prevent falls. What can I do on the outside of my home?  Regularly fix the edges of walkways and driveways and fix any cracks.  Remove anything that might make you trip as you walk through a door, such as a raised step or threshold.  Trim any bushes or trees on the path to your home.  Use bright outdoor lighting.  Clear any walking paths of anything that might make someone trip, such as rocks or tools.  Regularly check to see if handrails are loose or broken. Make sure that both sides of any steps have handrails.  Any raised decks and porches should have guardrails on the edges.  Have any leaves, snow, or ice cleared regularly.  Use sand or salt on walking paths during winter.  Clean up any spills in your garage right away. This includes oil or grease spills. What can I do in the bathroom?  Use night lights.  Install grab bars by the toilet and in the tub and shower. Do not use towel bars as grab bars.  Use non-skid mats or decals in the tub or shower.  If you need to sit down in the shower, use a plastic, non-slip stool.  Keep the floor dry. Clean up  any water that spills on the floor as soon as it happens.  Remove soap buildup in the tub or shower regularly.  Attach bath mats securely with double-sided non-slip rug tape.  Do not have throw rugs and other things on the floor that can make you trip. What can I do in the bedroom?  Use night lights.  Make sure that you have a light by your bed that is easy to reach.  Do not use any sheets or blankets that are too big for your bed. They should not hang down onto the floor.  Have a firm chair that has side arms. You can use this for support while you get dressed.  Do not have throw rugs and other things on the floor that can make you trip. What can I do in the kitchen?  Clean up any spills right away.  Avoid walking on wet floors.  Keep items that you use a lot in easy-to-reach places.  If you need to reach something above you, use a strong step stool that has a grab bar.  Keep electrical cords out of the way.  Do not use floor polish or wax that makes floors slippery. If  you must use wax, use non-skid floor wax.  Do not have throw rugs and other things on the floor that can make you trip. What can I do with my stairs?  Do not leave any items on the stairs.  Make sure that there are handrails on both sides of the stairs and use them. Fix handrails that are broken or loose. Make sure that handrails are as long as the stairways.  Check any carpeting to make sure that it is firmly attached to the stairs. Fix any carpet that is loose or worn.  Avoid having throw rugs at the top or bottom of the stairs. If you do have throw rugs, attach them to the floor with carpet tape.  Make sure that you have a light switch at the top of the stairs and the bottom of the stairs. If you do not have them, ask someone to add them for you. What else can I do to help prevent falls?  Wear shoes that:  Do not have high heels.  Have rubber bottoms.  Are comfortable and fit you well.  Are  closed at the toe. Do not wear sandals.  If you use a stepladder:  Make sure that it is fully opened. Do not climb a closed stepladder.  Make sure that both sides of the stepladder are locked into place.  Ask someone to hold it for you, if possible.  Clearly mark and make sure that you can see:  Any grab bars or handrails.  First and last steps.  Where the edge of each step is.  Use tools that help you move around (mobility aids) if they are needed. These include:  Canes.  Walkers.  Scooters.  Crutches.  Turn on the lights when you go into a dark area. Replace any light bulbs as soon as they burn out.  Set up your furniture so you have a clear path. Avoid moving your furniture around.  If any of your floors are uneven, fix them.  If there are any pets around you, be aware of where they are.  Review your medicines with your doctor. Some medicines can make you feel dizzy. This can increase your chance of falling. Ask your doctor what other things that you can do to help prevent falls. This information is not intended to replace advice given to you by your health care provider. Make sure you discuss any questions you have with your health care provider. Document Released: 05/17/2009 Document Revised: 12/27/2015 Document Reviewed: 08/25/2014 Elsevier Interactive Patient Education  2017 [employer].

## 2020-04-10 ENCOUNTER — Other Ambulatory Visit: Payer: Medicare Other

## 2020-04-13 ENCOUNTER — Ambulatory Visit: Payer: Medicare Other | Admitting: Endocrinology

## 2020-04-18 ENCOUNTER — Ambulatory Visit (INDEPENDENT_AMBULATORY_CARE_PROVIDER_SITE_OTHER): Payer: Medicare Other | Admitting: Podiatry

## 2020-04-18 ENCOUNTER — Encounter: Payer: Self-pay | Admitting: Podiatry

## 2020-04-18 ENCOUNTER — Other Ambulatory Visit: Payer: Self-pay

## 2020-04-18 DIAGNOSIS — E1151 Type 2 diabetes mellitus with diabetic peripheral angiopathy without gangrene: Secondary | ICD-10-CM

## 2020-04-18 DIAGNOSIS — L600 Ingrowing nail: Secondary | ICD-10-CM

## 2020-04-22 NOTE — Progress Notes (Signed)
Subjective: [name] presents today at risk foot care. Pt has h/o NIDDM with PAD and painful callus(es) left foot and painful mycotic toenails b/l that are difficult to trim. Pain interferes with ambulation. Aggravating factors include wearing enclosed shoe gear. Pain is relieved with periodic professional debridement.   He states he missed his last appointment due to car issues. He states his toe feels a little better, but does bother him from time to time.  He would like me to clean border out today, but chose not to have partial nail avulsion performed.  Christian Peng, NP is patient's PCP. Last visit was: 12/08/2019. He also sees Dr. Elayne Snare for diabetes and his last visit was 01/05/2020.  Past Medical History:  Diagnosis Date  . Absolute anemia 05/28/2014  . Aortic stenosis 09/17/2015  . Arthritis   . Ascites 11/18/2013  . BELLS PALSY 07/19/2010   Qualifier: Diagnosis of  By: [name], [name]    . CAD (coronary artery disease)    a. s/p CABG in 2003 b. s/p PCI to SVG-PDA in 07/2015 c. 11/2016: cath showing severe native CAD with patent LIMA-LAD and SVG-D1 with 80% stenosis of SVG-OM1-OM2. Initially medical management was recommended --> presented with recurrent angina --> s/p Synergy DES to proximal body of SVG-OM1-OM2, POBA to distal graft.   Marland Kitchen CAD- S/P PCI SVG-OM1 12/01/16 05/12/2006   Qualifier: Diagnosis of  By: [name], [name]    . Carotid bruit 07/10/2015  . Chest pain 11/22/2016  . Chronic kidney disease   . Chronic renal insufficiency, stage III (moderate) 12/02/2016  . Constipation 05/28/2014  . COPD 07/20/2008   Qualifier: Diagnosis of  By: [name], [name]   . DEPRESSION 09/03/2006   Qualifier: Diagnosis of  By: [name], [name]    . Dermatitis 11/27/2012  . Diabetes mellitus   . Diarrhea 11/20/2013  . DM (diabetes mellitus), type 2 with ophthalmic complications ([place]) 2/68/3419   Qualifier: Diagnosis of  By: [name], [name]    . Dry eye syndrome 12/20/2010  .  Epicondylitis 06/05/2013   right  . Erectile dysfunction 01/01/2012  . Essential hypertension 05/12/2006   Qualifier: Diagnosis of  By: [name], [name]    . GENITAL HERPES 05/03/2009   Qualifier: Diagnosis of  By: [name], [name]    . GERD 09/03/2006   Qualifier: Diagnosis of  By: [name], [name]    . GERD (gastroesophageal reflux disease)   . HEARING LOSS, SENSORINEURAL 05/12/2006   Qualifier: Diagnosis of  By: [name], [name]    . HEMATOCHEZIA 04/18/2008   Annotation: 9/09 during bout of constipation Qualifier: Diagnosis of  By: [name], [name]    . HIP PAIN, BILATERAL 07/17/2008   Qualifier: Diagnosis of  By: [name], [name]   . History of depression   . History of kidney stones   . HIV (human immunodeficiency virus infection) ([place]) 1991   on meds since initial dx.   Marland Kitchen HLD (hyperlipidemia) 11/19/2013  . Human immunodeficiency virus (HIV) disease ([place]) 05/12/2006   Qualifier: Diagnosis of  By: [name], [name]    . Hx of CABG 09/03/2006   Annotation: 2003 Qualifier: Diagnosis of  By: [name], [name]    . Hyperkalemia 03/23/2014  . Hyperlipidemia   . Hypertension   . Hyponatremia 03/23/2014  . INGUINAL LYMPHADENOPATHY, RIGHT 04/03/2009   Qualifier: Diagnosis of  By: [name], [name]    . Keratoma 01/24/2015  . KNEE PAIN, BILATERAL 07/17/2008  Qualifier: Diagnosis of  By: [name], [name]   . Lesion of breast 07/21/2015  . Lipodystrophy 12/20/2010  . Memory loss 09/18/2008   Qualifier: Diagnosis of  By: [name], [name]   . Metatarsal deformity 01/24/2015  . Nasal abscess 12/04/2015  . Night sweats 07/06/2012  . Nocturia 03/06/2013  . NSTEMI (non-ST elevated myocardial infarction) ([place]) 11/29/2016  . Pain in joint, ankle and foot 01/19/2015  . Pancreatitis 11/2013   attributed to HIV meds.   . Pedal edema 05/21/2014  . PERIPHERAL VASCULAR DISEASE 07/20/2008   Qualifier: Diagnosis of  By: [name], [name]   . Posterior cervical lymphadenopathy 03/30/2014  . Protein-calorie  malnutrition, severe ([place]) 03/23/2014  . SHINGLES, HX OF 05/03/2009   Annotation: R leg Qualifier: Diagnosis of  By: [name], [name]    . STEMI (ST elevation myocardial infarction) ([place]) 07/29/2015  . Unstable angina ([place]) 11/24/2016  . WEIGHT LOSS, ABNORMAL 04/03/2009   Qualifier: Diagnosis of  By: [name], [name]       Current Outpatient Medications on File Prior to Visit  Medication Sig Dispense Refill  . acetaminophen (TYLENOL) 325 MG tablet Take 2 tablets (650 mg total) by mouth every 4 (four) hours as needed for headache or mild pain.    Marland Kitchen aspirin 81 MG chewable tablet Chew 81 mg by mouth daily.    Marland Kitchen atorvastatin (LIPITOR) 20 MG tablet TAKE 1 TABLET(20 MG) BY MOUTH DAILY 90 tablet 1  . b complex vitamins tablet Take 1 tablet by mouth daily. 30 tablet 11  . Cholecalciferol 2000 units CAPS Take 1 capsule (2,000 Units total) by mouth daily. 30 each 11  . dolutegravir (TIVICAY) 50 MG tablet TAKE 1 TABLET BY MOUTH DAILY with Pifeltro 30 tablet 5  . doravirine (PIFELTRO) 100 MG TABS tablet TAKE 1 TABLET(100 MG) BY MOUTH DAILY with Tivicay 30 tablet 5  . fenofibrate 54 MG tablet TAKE 1 TABLET(54 MG) BY MOUTH DAILY 90 tablet 2  . furosemide (LASIX) 20 MG tablet Take 2 tablets (40 mg total) by mouth daily. 180 tablet 1  . glucose blood (FREESTYLE TEST STRIPS) test strip USE TO CHECK BLOOD SUGAR THREE TIMES DAILY 300 each 3  . isosorbide mononitrate (IMDUR) 30 MG 24 hr tablet Take 1 tablet (30 mg total) by mouth daily. 90 tablet 2  . Lancets (FREESTYLE) lancets Use as directed three times a day to check blood sugar.  DX E11.9 100 each 5  . lisinopril (ZESTRIL) 5 MG tablet TAKE 1 TABLET(5 MG) BY MOUTH DAILY 90 tablet 2  . metoprolol tartrate (LOPRESSOR) 25 MG tablet Take 0.5 tablets (12.5 mg total) by mouth 2 (two) times daily. NEED APPOINTMENT FOR FUTURE REFILL 90 tablet 3  . nitroGLYCERIN (NITROSTAT) 0.4 MG SL tablet PLACE 1 TABLET UNDER THE TONGUE EVERY 5 MINUTES AS NEEDED FOR CHEST PAIN 25  tablet 3  . pantoprazole (PROTONIX) 40 MG tablet TAKE 1 TABLET(40 MG) BY MOUTH DAILY 30 tablet 11  . pioglitazone (ACTOS) 15 MG tablet TAKE 1 TABLET(15 MG) BY MOUTH DAILY 30 tablet 3  . potassium chloride SA (KLOR-CON) 20 MEQ tablet TAKE 1 TABLET(20 MEQ) BY MOUTH THREE TIMES DAILY 270 tablet 1  . PREVIDENT 5000 PLUS 1.1 % CREA dental cream Take by mouth.    . Probiotic Product (PROBIOTIC DAILY) CAPS Take 1 by mouth daily 30 capsule 11  . repaglinide (PRANDIN) 2 MG tablet Take 2 mg by mouth 3 (three) times daily before meals. Take 2 tablet  by mouth daily before breakfast and 1 tablet by mouth daily before lunch and dinner.    . sodium bicarbonate 650 MG tablet Take 650 mg by mouth 3 (three) times daily.    . tamsulosin (FLOMAX) 0.4 MG CAPS capsule TK 1 C PO BID  11  . triamcinolone cream (KENALOG) 0.1 % apply twice daily as needed to dry, itchy skin  2  . valACYclovir (VALTREX) 500 MG tablet TAKE 1 TABLET BY MOUTH DAILY 30 tablet 5  . VASCEPA 1 g capsule TAKE 2 CAPSULES(2 GRAMS) BY MOUTH TWICE DAILY 120 capsule 2   Current Facility-Administered Medications on File Prior to Visit  Medication Dose Route Frequency Provider Last Rate Last Admin  . 0.9 %  sodium chloride infusion  500 mL Intravenous Once Milus Banister, MD      . dextrose 5 % solution   Intravenous Continuous Milus Banister, MD         No Known Allergies  Objective: [name] is a pleasant 74 y.o. male, in NAD. AAO x 3.  There were no vitals filed for this visit.  Vascular Examination: Capillary refill time to digits <4 seconds b/l. Nonpalpable DP pulse(s) b/l lower extremities. Nonpalpable PT pulse(s) b/l lower extremities. Pedal hair absent b/l. Skin temperature gradient within normal limits b/l. No ischemia or signs of gangrene noted. No pain with calf compression b/l. No edema noted b/l.  Dermatological Examination: Pedal skin is thin shiny, atrophic bilaterally. No open wounds bilaterally. No interdigital  macerations bilaterally. Toenails 1-5 left, 2-5 right recently trimmed with adequate length.  Incurvated nailplate lateral border(s) R hallux.  There is nail border hypertrophy. There is tenderness to palpation. Signs of infection are not present: no erythema, no edema, no drainage, no fluctuance.  Musculoskeletal: Normal muscle strength 5/5 to all lower extremity muscle groups bilaterally. No pain crepitus or joint limitation noted with ROM b/l. No gross bony deformities bilaterally. Patient ambulates independent of any assistive aids.  Neurological Examination: Protective sensation intact 5/5 intact bilaterally with 10g monofilament b/l. Vibratory sensation intact b/l.  Assessment: 1. Ingrown toenail without infection   2. Type II diabetes mellitus with peripheral circulatory disorder (HCC)     Plan: -Examined patient. -Patient to continue soft, supportive shoe gear daily. -Patient to report any pedal injuries to medical professional immediately. -Offending nail border debrided and curretaged R hallux utilizing sterile nail nipper and currette. Border(s) cleansed with alcohol and triple antibiotic ointment and band-aid applied. Advised antibiotic ointment to digit once daily for one week. We discussed treatment options of temporary partial nail avulsion to allow border to heal. I advised him a matrixectomy is not an options because it causes a chemical burn and he may not heal from that procedure.  -Patient/POA to call should there be question/concern in the interim.  Return in about 3 months (around 07/18/2020) for diabetic nail and callus trim.  Marzetta Board, DPM

## 2020-05-02 ENCOUNTER — Ambulatory Visit (HOSPITAL_COMMUNITY)
Admission: EM | Admit: 2020-05-02 | Discharge: 2020-05-02 | Disposition: A | Discharge status: Home / Self Care | Payer: Medicare Other | Attending: Family Medicine | Admitting: Family Medicine

## 2020-05-02 ENCOUNTER — Other Ambulatory Visit: Payer: Self-pay

## 2020-05-02 ENCOUNTER — Encounter (HOSPITAL_COMMUNITY): Payer: Self-pay | Admitting: Emergency Medicine

## 2020-05-02 DIAGNOSIS — S76012A Strain of muscle, fascia and tendon of left hip, initial encounter: Secondary | ICD-10-CM | POA: Diagnosis not present

## 2020-05-02 DIAGNOSIS — W19XXXA Unspecified fall, initial encounter: Secondary | ICD-10-CM | POA: Diagnosis not present

## 2020-05-02 NOTE — ED Triage Notes (Signed)
Pt c/o fall around 1330. He was in a grocery store and there was a spill there that he slipped in. Pt is having left sided shoulder, hip and leg pain. Pt states he took some tylenol for pain.

## 2020-05-02 NOTE — ED Provider Notes (Signed)
[facility]    CSN: [id number] Arrival date & time: 05/02/20  1817      History   Chief Complaint Chief Complaint  Patient presents with  . Fall    HPI [name] is a 74 y.o. male.   He is presenting with left gluteus pain after a fall at a grocery store.  There is an area of wetness that he fell on.  He is unsure of how he fell or how he landed.  Denies any radicular pain.  Having some left-sided pain.  Able to ambulate.  Denies any numbness or tingling.  Happened earlier today.    Past Medical History:  Diagnosis Date  . Absolute anemia 05/28/2014  . Aortic stenosis 09/17/2015  . Arthritis   . Ascites 11/18/2013  . BELLS PALSY 07/19/2010   Qualifier: Diagnosis of  By: [name], [name]    . CAD (coronary artery disease)    a. s/p CABG in 2003 b. s/p PCI to SVG-PDA in 07/2015 c. 11/2016: cath showing severe native CAD with patent LIMA-LAD and SVG-D1 with 80% stenosis of SVG-OM1-OM2. Initially medical management was recommended --> presented with recurrent angina --> s/p Synergy DES to proximal body of SVG-OM1-OM2, POBA to distal graft.   Marland Kitchen CAD- S/P PCI SVG-OM1 12/01/16 05/12/2006   Qualifier: Diagnosis of  By: [name], [name]    . Carotid bruit 07/10/2015  . Chest pain 11/22/2016  . Chronic kidney disease   . Chronic renal insufficiency, stage III (moderate) 12/02/2016  . Constipation 05/28/2014  . COPD 07/20/2008   Qualifier: Diagnosis of  By: [name], [name]   . DEPRESSION 09/03/2006   Qualifier: Diagnosis of  By: [name], [name]    . Dermatitis 11/27/2012  . Diabetes mellitus   . Diarrhea 11/20/2013  . DM (diabetes mellitus), type 2 with ophthalmic complications ([place]) 5/57/3220   Qualifier: Diagnosis of  By: [name], [name]    . Dry eye syndrome 12/20/2010  . Epicondylitis 06/05/2013   right  . Erectile dysfunction 01/01/2012  . Essential hypertension 05/12/2006   Qualifier: Diagnosis of  By: [name], [name]    . GENITAL HERPES 05/03/2009   Qualifier:  Diagnosis of  By: [name], [name]    . GERD 09/03/2006   Qualifier: Diagnosis of  By: [name], [name]    . GERD (gastroesophageal reflux disease)   . HEARING LOSS, SENSORINEURAL 05/12/2006   Qualifier: Diagnosis of  By: [name], [name]    . HEMATOCHEZIA 04/18/2008   Annotation: 9/09 during bout of constipation Qualifier: Diagnosis of  By: [name], [name]    . HIP PAIN, BILATERAL 07/17/2008   Qualifier: Diagnosis of  By: [name], [name]   . History of depression   . History of kidney stones   . HIV (human immunodeficiency virus infection) ([place]) 1991   on meds since initial dx.   Marland Kitchen HLD (hyperlipidemia) 11/19/2013  . Human immunodeficiency virus (HIV) disease ([place]) 05/12/2006   Qualifier: Diagnosis of  By: [name], [name]    . Hx of CABG 09/03/2006   Annotation: 2003 Qualifier: Diagnosis of  By: [name], [name]    . Hyperkalemia 03/23/2014  . Hyperlipidemia   . Hypertension   . Hyponatremia 03/23/2014  . INGUINAL LYMPHADENOPATHY, RIGHT 04/03/2009   Qualifier: Diagnosis of  By: [name], [name]    . Keratoma 01/24/2015  . KNEE PAIN, BILATERAL 07/17/2008   Qualifier: Diagnosis of  By: [name], [name]   .  Lesion of breast 07/21/2015  . Lipodystrophy 12/20/2010  . Memory loss 09/18/2008   Qualifier: Diagnosis of  By: [name], [name]   . Metatarsal deformity 01/24/2015  . Nasal abscess 12/04/2015  . Night sweats 07/06/2012  . Nocturia 03/06/2013  . NSTEMI (non-ST elevated myocardial infarction) ([place]) 11/29/2016  . Pain in joint, ankle and foot 01/19/2015  . Pancreatitis 11/2013   attributed to HIV meds.   . Pedal edema 05/21/2014  . PERIPHERAL VASCULAR DISEASE 07/20/2008   Qualifier: Diagnosis of  By: [name], [name]   . Posterior cervical lymphadenopathy 03/30/2014  . Protein-calorie malnutrition, severe ([place]) 03/23/2014  . SHINGLES, HX OF 05/03/2009   Annotation: R leg Qualifier: Diagnosis of  By: [name], [name]    . STEMI (ST elevation myocardial infarction) ([place]) 07/29/2015  .  Unstable angina ([place]) 11/24/2016  . WEIGHT LOSS, ABNORMAL 04/03/2009   Qualifier: Diagnosis of  By: [name], [name]      Patient Active Problem List   Diagnosis Date Noted  . Impingement syndrome of left shoulder region 07/08/2018  . Stiffness of left shoulder joint 07/08/2018  . Pain in joint of left shoulder 07/08/2018  . Chronic renal insufficiency, stage III (moderate) 12/02/2016  . NSTEMI (non-ST elevated myocardial infarction) ([place]) 11/29/2016  . Neck mass 06/16/2016  . Aortic stenosis 09/17/2015  . STEMI (ST elevation myocardial infarction) ([place]) 07/29/2015  . Lesion of breast 07/21/2015  . Carotid bruit 07/10/2015  . Metatarsal deformity 01/24/2015  . Keratoma 01/24/2015  . Pain in joint, ankle and foot 01/19/2015  . Absolute anemia 05/28/2014  . Pedal edema 05/21/2014  . Posterior cervical lymphadenopathy 03/30/2014  . Protein-calorie malnutrition, severe ([place]) 03/23/2014  . Diarrhea 11/20/2013  . HLD (hyperlipidemia) 11/19/2013  . Ascites 11/18/2013  . Pancreatitis 11/15/2013  . Epicondylitis 06/05/2013  . Nocturia 03/06/2013  . Dermatitis 11/27/2012  . Erectile dysfunction 01/01/2012  . Lipodystrophy 12/20/2010  . Dry eye syndrome 12/20/2010  . BELLS PALSY 07/19/2010  . GENITAL HERPES 05/03/2009  . SHINGLES, HX OF 05/03/2009  . WEIGHT LOSS, ABNORMAL 04/03/2009  . INGUINAL LYMPHADENOPATHY, RIGHT 04/03/2009  . MEMORY LOSS 09/18/2008  . PERIPHERAL VASCULAR DISEASE 07/20/2008  . Chronic obstructive pulmonary disease ([place]) 07/20/2008  . HIP PAIN, BILATERAL 07/17/2008  . KNEE PAIN, BILATERAL 07/17/2008  . NEPHROLITHIASIS, HX OF 02/22/2008  . DM (diabetes mellitus), type 2 with ophthalmic complications ([place]) 40/98/1191  . Depression, recurrent ([place]) 09/03/2006  . GERD 09/03/2006  . Hx of CABG 09/03/2006  . Type 2 diabetes mellitus ([place]) 09/03/2006  . Human immunodeficiency virus (HIV) disease ([place]) 05/12/2006  . HEARING LOSS, SENSORINEURAL 05/12/2006  .  Essential hypertension 05/12/2006  . CAD- S/P PCI SVG-OM1 12/01/16 05/12/2006  . Atherosclerosis of coronary artery 05/12/2006    Past Surgical History:  Procedure Laterality Date  . CARDIAC CATHETERIZATION N/A 07/29/2015   Procedure: Left Heart Cath and Coronary Angiography;  Surgeon: [name], MD;  Location: [facility];  Service: Cardiovascular;  Laterality: N/A;  . CARDIAC CATHETERIZATION N/A 07/29/2015   Procedure: Coronary Stent Intervention;  Surgeon: [name], MD;  Location: [facility];  Service: Cardiovascular;  Laterality: N/A;  . COLONOSCOPY  12/02/2019  . CORONARY ARTERY BYPASS GRAFT  05/2002  . CORONARY STENT INTERVENTION N/A 12/01/2016   Procedure: Coronary Stent Intervention;  Surgeon: [name], MD;  Location: [facility];  Service: Cardiovascular;  Laterality: N/A;  . CORONARY STENT INTERVENTION N/A 10/21/2017   Procedure: CORONARY  STENT INTERVENTION;  Surgeon: Jettie Booze, MD;  Location: [facility];  Service: Cardiovascular;  Laterality: N/A;  . LEFT HEART CATH AND CORS/GRAFTS ANGIOGRAPHY N/A 11/24/2016   Procedure: Left Heart Cath and Cors/Grafts Angiography;  Surgeon: Nelva Bush, MD;  Location: [facility];  Service: Cardiovascular;  Laterality: N/A;  . LEFT HEART CATH AND CORS/GRAFTS ANGIOGRAPHY N/A 12/01/2016   Procedure: Left Heart Cath and Cors/Grafts Angiography;  Surgeon: [name], MD;  Location: [facility];  Service: Cardiovascular;  Laterality: N/A;  . LEFT HEART CATH AND CORS/GRAFTS ANGIOGRAPHY N/A 10/21/2017   Procedure: LEFT HEART CATH AND CORS/GRAFTS ANGIOGRAPHY;  Surgeon: Jettie Booze, MD;  Location: [facility];  Service: Cardiovascular;  Laterality: N/A;  . VASECTOMY         Home Medications    Prior to Admission medications   Medication Sig Start Date End Date Taking? Authorizing Provider  acetaminophen (TYLENOL) 325 MG tablet Take 2 tablets (650 mg  total) by mouth every 4 (four) hours as needed for headache or mild pain. 12/02/16   Erlene Quan, PA-C  aspirin 81 MG chewable tablet Chew 81 mg by mouth daily.    [provider]  atorvastatin (LIPITOR) 20 MG tablet TAKE 1 TABLET(20 MG) BY MOUTH DAILY 02/13/20   Michel Bickers, MD  b complex vitamins tablet Take 1 tablet by mouth daily. 09/25/16   Mosie Lukes, MD  Cholecalciferol 2000 units CAPS Take 1 capsule (2,000 Units total) by mouth daily. 09/25/16   Mosie Lukes, MD  dolutegravir (TIVICAY) 50 MG tablet TAKE 1 TABLET BY MOUTH DAILY with Pifeltro 12/28/19   Michel Bickers, MD  doravirine (PIFELTRO) 100 MG TABS tablet TAKE 1 TABLET(100 MG) BY MOUTH DAILY with Tivicay 12/28/19   Michel Bickers, MD  fenofibrate 54 MG tablet TAKE 1 TABLET(54 MG) BY MOUTH DAILY 09/28/19   Almyra Deforest, PA  furosemide (LASIX) 20 MG tablet Take 2 tablets (40 mg total) by mouth daily. 01/10/20   Nafziger, Tommi Rumps, NP  glucose blood (FREESTYLE TEST STRIPS) test strip USE TO CHECK BLOOD SUGAR THREE TIMES DAILY 11/18/19   Elayne Snare, MD  isosorbide mononitrate (IMDUR) 30 MG 24 hr tablet Take 1 tablet (30 mg total) by mouth daily. 01/16/20   Lelon Perla, MD  Lancets (FREESTYLE) lancets Use as directed three times a day to check blood sugar.  DX E11.9 11/03/17   Elayne Snare, MD  lisinopril (ZESTRIL) 5 MG tablet TAKE 1 TABLET(5 MG) BY MOUTH DAILY 11/11/19   Barrett, Evelene Croon, PA-C  metoprolol tartrate (LOPRESSOR) 25 MG tablet Take 0.5 tablets (12.5 mg total) by mouth 2 (two) times daily. NEED APPOINTMENT FOR FUTURE REFILL 02/13/20   Lelon Perla, MD  nitroGLYCERIN (NITROSTAT) 0.4 MG SL tablet PLACE 1 TABLET UNDER THE TONGUE EVERY 5 MINUTES AS NEEDED FOR CHEST PAIN 03/19/20   Lelon Perla, MD  pantoprazole (PROTONIX) 40 MG tablet TAKE 1 TABLET(40 MG) BY MOUTH DAILY 10/12/19   Lelon Perla, MD  pioglitazone (ACTOS) 15 MG tablet TAKE 1 TABLET(15 MG) BY MOUTH DAILY 05/13/19   Elayne Snare, MD  potassium chloride  SA (KLOR-CON) 20 MEQ tablet TAKE 1 TABLET(20 MEQ) BY MOUTH THREE TIMES DAILY 11/29/19   Nafziger, Tommi Rumps, NP  PREVIDENT 5000 PLUS 1.1 % CREA dental cream Take by mouth. 03/02/20   [provider]  Probiotic Product (PROBIOTIC DAILY) CAPS Take 1 by mouth daily 09/25/16   Mosie Lukes, MD  repaglinide (PRANDIN) 2 MG tablet Take 2 mg by mouth 3 (three) times daily before meals. Take 2 tablet by mouth daily before breakfast and 1 tablet by mouth daily before lunch and dinner.    [provider]  sodium bicarbonate 650 MG tablet Take 650 mg by mouth 3 (three) times daily.    [provider]  tamsulosin (FLOMAX) 0.4 MG CAPS capsule TK 1 C PO BID 05/19/18   [provider]  triamcinolone cream (KENALOG) 0.1 % apply twice daily as needed to dry, itchy skin 10/15/15   [provider]  valACYclovir (VALTREX) 500 MG tablet TAKE 1 TABLET BY MOUTH DAILY 03/27/20   Michel Bickers, MD  VASCEPA 1 g capsule TAKE 2 CAPSULES(2 GRAMS) BY MOUTH TWICE DAILY 03/16/20   Elayne Snare, MD    Family History Family History  Problem Relation Age of Onset  . Hyperlipidemia Mother   . Diabetes Mother        paternal grandparents/1 brother  . Hyperlipidemia Father   . Hypertension Father        paternal grandmother/3 brothers/1 sister  . Arthritis Other        mother/father/paternal grandparents  . Breast cancer Maternal Aunt        paternal aunt  . Lung cancer Maternal Aunt   . Heart disease Other        parents/maternal grandparents/ 2 brothers  . Stroke Paternal Grandmother   . Mental retardation Sister   . Colon cancer Neg Hx   . Esophageal cancer Neg Hx   . Pancreatic cancer Neg Hx   . Stomach cancer Neg Hx   . Liver disease Neg Hx   . Colon polyps Neg Hx   . Rectal cancer Neg Hx     Social History Social History   Tobacco Use  . Smoking status: Never Smoker  . Smokeless tobacco: Never Used  Vaping Use  . Vaping Use: Never used  Substance Use Topics  .  Alcohol use: Not Currently    Alcohol/week: 0.0 standard drinks    Comment: rare  . Drug use: No     Allergies   Patient has no known allergies.   Review of Systems Review of Systems  See HPI  Physical Exam Triage Vital Signs ED Triage Vitals  Enc Vitals Group     BP 05/02/20 2027 (!) 143/65     Pulse Rate 05/02/20 2027 72     Resp --      Temp 05/02/20 2027 98 F (36.7 C)     Temp Source 05/02/20 2027 Tympanic     SpO2 05/02/20 2027 99 %     Weight --      Height --      Head Circumference --      Peak Flow --      Pain Score 05/02/20 2024 8     Pain Loc --      Pain Edu? --      Excl. in [place]? --    No data found.  Updated Vital Signs BP (!) 143/65 (BP Location: Right Arm)   Pulse 72   Temp 98 F (36.7 C) (Tympanic)   SpO2 99%   Visual Acuity Right Eye Distance:   Left Eye Distance:   Bilateral Distance:    Right Eye Near:   Left Eye Near:    Bilateral Near:     Physical Exam Gen: NAD, alert, cooperative with exam, well-appearing ENT: normal lips, normal nasal mucosa,  Eye:  normal EOM, normal conjunctiva and lids  Skin: no rashes, no areas of induration  Neuro: normal tone, normal sensation to touch Psych:  normal insight, alert and oriented MSK:  Left hip:  Normal internal and external rotation. Normal strength with hip flexion. Negative straight leg raise. Normal strength resistance. Able ambulate without pain. Neurovascular intact   UC Treatments / Results  Labs (all labs ordered are listed, but only abnormal results are displayed) Labs Reviewed - No data to display  EKG   Radiology No results found.  Procedures Procedures (including critical care time)  Medications Ordered in UC Medications - No data to display  Initial Impression / Assessment and Plan / UC Course  I have reviewed the triage vital signs and the nursing notes.  Pertinent labs & imaging results that were available during my care of the patient were reviewed  by me and considered in my medical decision making (see chart for details).     Mr. Dorian is a 74 year old male that presents with left gluteal pain after a fall at a grocery store earlier today.  No signs of fracture on exam.  Likely contusion.  Counseled on home exercise therapy and supportive care.  Given indications on follow-up.  Final Clinical Impressions(s) / UC Diagnoses   Final diagnoses:  Fall, initial encounter  Muscle strain of left gluteal region, initial encounter     Discharge Instructions     Please try heat and ice  Please try tylenol  Please follow up if your symptoms fail to improve.    ED Prescriptions    None     PDMP not reviewed this encounter.   Rosemarie Ax, MD 05/02/20 2153

## 2020-05-02 NOTE — Discharge Instructions (Signed)
Please try heat and ice  Please try tylenol  Please follow up if your symptoms fail to improve.

## 2020-05-03 ENCOUNTER — Other Ambulatory Visit (INDEPENDENT_AMBULATORY_CARE_PROVIDER_SITE_OTHER): Payer: Medicare Other

## 2020-05-03 DIAGNOSIS — E1151 Type 2 diabetes mellitus with diabetic peripheral angiopathy without gangrene: Secondary | ICD-10-CM | POA: Diagnosis not present

## 2020-05-03 LAB — BASIC METABOLIC PANEL
BUN: 24 mg/dL — ABNORMAL HIGH (ref 6–23)
CO2: 25 mEq/L (ref 19–32)
Calcium: 8.8 mg/dL (ref 8.4–10.5)
Chloride: 104 mEq/L (ref 96–112)
Creatinine, Ser: 1.69 mg/dL — ABNORMAL HIGH (ref 0.40–1.50)
GFR: 40.09 mL/min — ABNORMAL LOW (ref >60.00)
Glucose, Bld: 107 mg/dL — ABNORMAL HIGH (ref 70–99)
Potassium: 4.3 mEq/L (ref 3.5–5.1)
Sodium: 136 mEq/L (ref 135–145)

## 2020-05-03 LAB — HEMOGLOBIN A1C: Hgb A1c MFr Bld: 7.2 % — ABNORMAL HIGH (ref 4.6–6.5)

## 2020-05-04 LAB — FRUCTOSAMINE: Fructosamine: 247 umol/L (ref 0–285)

## 2020-05-09 ENCOUNTER — Ambulatory Visit (INDEPENDENT_AMBULATORY_CARE_PROVIDER_SITE_OTHER): Payer: Medicare Other | Admitting: Endocrinology

## 2020-05-09 ENCOUNTER — Encounter: Payer: Self-pay | Admitting: Endocrinology

## 2020-05-09 ENCOUNTER — Other Ambulatory Visit: Payer: Self-pay

## 2020-05-09 VITALS — BP 110/64 | HR 60 | Ht 63.0 in | Wt 138.0 lb

## 2020-05-09 DIAGNOSIS — E782 Mixed hyperlipidemia: Secondary | ICD-10-CM | POA: Diagnosis not present

## 2020-05-09 DIAGNOSIS — I2510 Atherosclerotic heart disease of native coronary artery without angina pectoris: Secondary | ICD-10-CM

## 2020-05-09 DIAGNOSIS — E1165 Type 2 diabetes mellitus with hyperglycemia: Secondary | ICD-10-CM | POA: Diagnosis not present

## 2020-05-09 DIAGNOSIS — Z23 Encounter for immunization: Secondary | ICD-10-CM | POA: Diagnosis not present

## 2020-05-09 NOTE — Patient Instructions (Addendum)
Check blood sugars on waking up 1-2 days a week  MUST check blood sugars about 2 hours after meals and do this after different meals by rotation  Recommended blood sugar levels on waking up are 90-130 and about 2 hours after meal is 130-180  Please bring your blood sugar monitor to each visit, thank you  Add a protein to Breakfast daily

## 2020-05-09 NOTE — Progress Notes (Signed)
Patient ID: [name], male   DOB: Jul 02, 1946, 74 y.o.   MRN: [id number]           Reason for Appointment: Follow-up for Type 2 Diabetes   History of Present Illness:          Date of diagnosis of type 2 diabetes mellitus: 2013        Background history:  He only mildly increased blood sugar levels at the time of diagnosis and A1c was 7.1  He had been treated initially with metformin but this was stopped subsequently when his renal function was worse  He was subsequently treated with glipizide but his blood sugars had been poorly controlled in 2016 with glipizide alone Since his A1c has been persistently over 9% he was started on Januvia 50 mg in 01/2015 At initial consultation he had a high A1c of 9.5; he was switched from glipizide to Prandin in 02/2015 Previously when he had high fasting readings he was started on Toujeo on 06/11/16, however this was stopped in April 2018 when he was getting hypoglycemia  Recent history:    Oral hypoglycemic drugs: Prandin 4 mg at breakfast, 2 mg at dinner and 2 mg before lunch , Actos 15 mg daily, metformin ER 500 mg daily   His A1c is 7.2 compared to 6.8  Fructosamine last was 253, now 247  Current blood sugar patterns and problems identified:  He has checked his morning blood sugars only twice in the last month  Today blood sugar is 185 about an hour or so after eating cereal  Normally does not eat cereal and mostly oatmeal  Not clear if his sugars are higher after meals with stopping Metformin on the last visit  With this however he has not had any tendency to overnight hypoglycemia that he had previously with blood sugars as low as 47  He says he has been trying to walk more regularly  Weight is about the same  Usually trying to take Prandin with each meal  Blood sugar checked only 3 times in the last month and mostly fasting which is fairly good  No edema with Actos  Most of the time is able to remember to take his Prandin  before eating Test strips are not expired  Side effects from medications have been: None   Glucose monitoring:  done usually 0-1  times a day         Glucometer:  FreeStyle     Blood Glucose readings  Fasting blood sugar at home 107, 126  Previous download:  PRE-MEAL Fasting Lunch Dinner Bedtime Overall  Glucose range:  61-110  60,87  76-221   Mean/median: 86    145 96     Dietician visit, most recent: 8/15 DIET:  has been trying to reduce fried food usually not eating high fat  foods; eating oatmeal/cereal in the morning                  Dinner usually at 6 pm .  Weight history:   Wt Readings from Last 3 Encounters:  05/09/20 138 lb (62.6 kg)  03/20/20 137 lb (62.1 kg)  01/05/20 139 lb 12.8 oz (63.4 kg)    Glycemic control:   Lab Results  Component Value Date   HGBA1C 7.2 (H) 05/03/2020   HGBA1C 6.8 (H) 01/03/2020   HGBA1C 7.3 (H) 09/27/2019   Lab Results  Component Value Date   MICROALBUR 8.1 (H) 09/27/2019   LDLCALC 58 04/26/2019  CREATININE 1.69 (H) 05/03/2020    Other active problems: See review of systems   Lab on 05/03/2020  Component Date Value Ref Range Status  . Fructosamine 05/03/2020 247  0 - 285 umol/L Final   Comment: Published reference interval for apparently healthy subjects between age 68 and 26 is 42 - 285 umol/L and in a poorly controlled diabetic population is 228 - 563 umol/L with a mean of 396 umol/L.   Marland Kitchen Sodium 05/03/2020 136  135 - 145 mEq/L Final  . Potassium 05/03/2020 4.3  3.5 - 5.1 mEq/L Final  . Chloride 05/03/2020 104  96 - 112 mEq/L Final  . CO2 05/03/2020 25  19 - 32 mEq/L Final  . Glucose, Bld 05/03/2020 107* 70 - 99 mg/dL Final  . BUN 05/03/2020 24* 6 - 23 mg/dL Final  . Creatinine, Ser 05/03/2020 1.69* 0.40 - 1.50 mg/dL Final  . GFR 05/03/2020 40.09* >60.00 mL/min Final  . Calcium 05/03/2020 8.8  8.4 - 10.5 mg/dL Final  . Hgb A1c MFr Bld 05/03/2020 7.2* 4.6 - 6.5 % Final   Glycemic Control Guidelines for People  with Diabetes:Non Diabetic:  <6%Goal of Therapy: <7%Additional Action Suggested:  >8%        Allergies as of 05/09/2020   No Known Allergies     Medication List       Accurate as of May 09, 2020  9:26 AM. If you have any questions, ask your nurse or doctor.        acetaminophen 325 MG tablet Commonly known as: TYLENOL Take 2 tablets (650 mg total) by mouth every 4 (four) hours as needed for headache or mild pain.   aspirin 81 MG chewable tablet Chew 81 mg by mouth daily.   atorvastatin 20 MG tablet Commonly known as: LIPITOR TAKE 1 TABLET(20 MG) BY MOUTH DAILY   b complex vitamins tablet Take 1 tablet by mouth daily.   Cholecalciferol 50 MCG (2000 UT) Caps Take 1 capsule (2,000 Units total) by mouth daily.   doravirine 100 MG Tabs tablet Commonly known as: Pifeltro TAKE 1 TABLET(100 MG) BY MOUTH DAILY with Tivicay   fenofibrate 54 MG tablet TAKE 1 TABLET(54 MG) BY MOUTH DAILY   freestyle lancets Use as directed three times a day to check blood sugar.  DX E11.9   FREESTYLE TEST STRIPS test strip Generic drug: glucose blood USE TO CHECK BLOOD SUGAR THREE TIMES DAILY   furosemide 20 MG tablet Commonly known as: LASIX Take 2 tablets (40 mg total) by mouth daily.   isosorbide mononitrate 30 MG 24 hr tablet Commonly known as: IMDUR Take 1 tablet (30 mg total) by mouth daily.   lisinopril 5 MG tablet Commonly known as: ZESTRIL TAKE 1 TABLET(5 MG) BY MOUTH DAILY   metoprolol tartrate 25 MG tablet Commonly known as: LOPRESSOR Take 0.5 tablets (12.5 mg total) by mouth 2 (two) times daily. NEED APPOINTMENT FOR FUTURE REFILL   nitroGLYCERIN 0.4 MG SL tablet Commonly known as: NITROSTAT PLACE 1 TABLET UNDER THE TONGUE EVERY 5 MINUTES AS NEEDED FOR CHEST PAIN   pantoprazole 40 MG tablet Commonly known as: PROTONIX TAKE 1 TABLET(40 MG) BY MOUTH DAILY   pioglitazone 15 MG tablet Commonly known as: ACTOS TAKE 1 TABLET(15 MG) BY MOUTH DAILY   potassium  chloride SA 20 MEQ tablet Commonly known as: KLOR-CON TAKE 1 TABLET(20 MEQ) BY MOUTH THREE TIMES DAILY   PreviDent 5000 Plus 1.1 % Crea dental cream Generic drug: sodium fluoride Take by mouth.  Probiotic Daily Caps Take 1 by mouth daily   repaglinide 2 MG tablet Commonly known as: PRANDIN Take 2 mg by mouth 3 (three) times daily before meals. Take 2 tablet by mouth daily before breakfast and 1 tablet by mouth daily before lunch and dinner.   sodium bicarbonate 650 MG tablet Take 650 mg by mouth 3 (three) times daily.   tamsulosin 0.4 MG Caps capsule Commonly known as: FLOMAX TK 1 C PO BID   Tivicay 50 MG tablet Generic drug: dolutegravir TAKE 1 TABLET BY MOUTH DAILY with Pifeltro   triamcinolone cream 0.1 % Commonly known as: KENALOG apply twice daily as needed to dry, itchy skin   valACYclovir 500 MG tablet Commonly known as: VALTREX TAKE 1 TABLET BY MOUTH DAILY   Vascepa 1 g capsule Generic drug: icosapent Ethyl TAKE 2 CAPSULES(2 GRAMS) BY MOUTH TWICE DAILY       Allergies: No Known Allergies  Past Medical History:  Diagnosis Date  . Absolute anemia 05/28/2014  . Aortic stenosis 09/17/2015  . Arthritis   . Ascites 11/18/2013  . BELLS PALSY 07/19/2010   Qualifier: Diagnosis of  By: [name], [name]    . CAD (coronary artery disease)    a. s/p CABG in 2003 b. s/p PCI to SVG-PDA in 07/2015 c. 11/2016: cath showing severe native CAD with patent LIMA-LAD and SVG-D1 with 80% stenosis of SVG-OM1-OM2. Initially medical management was recommended --> presented with recurrent angina --> s/p Synergy DES to proximal body of SVG-OM1-OM2, POBA to distal graft.   Marland Kitchen CAD- S/P PCI SVG-OM1 12/01/16 05/12/2006   Qualifier: Diagnosis of  By: [name], [name]    . Carotid bruit 07/10/2015  . Chest pain 11/22/2016  . Chronic kidney disease   . Chronic renal insufficiency, stage III (moderate) ([place]) 12/02/2016  . Constipation 05/28/2014  . COPD 07/20/2008   Qualifier: Diagnosis of   By: [name], [name]   . DEPRESSION 09/03/2006   Qualifier: Diagnosis of  By: [name], [name]    . Dermatitis 11/27/2012  . Diabetes mellitus   . Diarrhea 11/20/2013  . DM (diabetes mellitus), type 2 with ophthalmic complications ([place]) 01/08/3015   Qualifier: Diagnosis of  By: [name], [name]    . Dry eye syndrome 12/20/2010  . Epicondylitis 06/05/2013   right  . Erectile dysfunction 01/01/2012  . Essential hypertension 05/12/2006   Qualifier: Diagnosis of  By: [name], [name]    . GENITAL HERPES 05/03/2009   Qualifier: Diagnosis of  By: [name], [name]    . GERD 09/03/2006   Qualifier: Diagnosis of  By: [name], [name]    . GERD (gastroesophageal reflux disease)   . HEARING LOSS, SENSORINEURAL 05/12/2006   Qualifier: Diagnosis of  By: [name], [name]    . HEMATOCHEZIA 04/18/2008   Annotation: 9/09 during bout of constipation Qualifier: Diagnosis of  By: [name], [name]    . HIP PAIN, BILATERAL 07/17/2008   Qualifier: Diagnosis of  By: [name], [name]   . History of depression   . History of kidney stones   . HIV (human immunodeficiency virus infection) ([place]) 1991   on meds since initial dx.   Marland Kitchen HLD (hyperlipidemia) 11/19/2013  . Human immunodeficiency virus (HIV) disease ([place]) 05/12/2006   Qualifier: Diagnosis of  By: [name], [name]    . Hx of CABG 09/03/2006   Annotation: 2003 Qualifier: Diagnosis of  By: [name], [name]    . Hyperkalemia 03/23/2014  . Hyperlipidemia   .  Hypertension   . Hyponatremia 03/23/2014  . INGUINAL LYMPHADENOPATHY, RIGHT 04/03/2009   Qualifier: Diagnosis of  By: [name], [name]    . Keratoma 01/24/2015  . KNEE PAIN, BILATERAL 07/17/2008   Qualifier: Diagnosis of  By: [name], [name]   . Lesion of breast 07/21/2015  . Lipodystrophy 12/20/2010  . Memory loss 09/18/2008   Qualifier: Diagnosis of  By: [name], [name]   . Metatarsal deformity 01/24/2015  . Nasal abscess 12/04/2015  . Night sweats 07/06/2012  . Nocturia 03/06/2013  . NSTEMI (non-ST  elevated myocardial infarction) ([place]) 11/29/2016  . Pain in joint, ankle and foot 01/19/2015  . Pancreatitis 11/2013   attributed to HIV meds.   . Pedal edema 05/21/2014  . PERIPHERAL VASCULAR DISEASE 07/20/2008   Qualifier: Diagnosis of  By: [name], [name]   . Posterior cervical lymphadenopathy 03/30/2014  . Protein-calorie malnutrition, severe ([place]) 03/23/2014  . SHINGLES, HX OF 05/03/2009   Annotation: R leg Qualifier: Diagnosis of  By: [name], [name]    . STEMI (ST elevation myocardial infarction) ([place]) 07/29/2015  . Unstable angina ([place]) 11/24/2016  . WEIGHT LOSS, ABNORMAL 04/03/2009   Qualifier: Diagnosis of  By: [name], [name]      Past Surgical History:  Procedure Laterality Date  . CARDIAC CATHETERIZATION N/A 07/29/2015   Procedure: Left Heart Cath and Coronary Angiography;  Surgeon: [name], MD;  Location: [facility];  Service: Cardiovascular;  Laterality: N/A;  . CARDIAC CATHETERIZATION N/A 07/29/2015   Procedure: Coronary Stent Intervention;  Surgeon: [name], MD;  Location: [facility];  Service: Cardiovascular;  Laterality: N/A;  . COLONOSCOPY  12/02/2019  . CORONARY ARTERY BYPASS GRAFT  05/2002  . CORONARY STENT INTERVENTION N/A 12/01/2016   Procedure: Coronary Stent Intervention;  Surgeon: [name], MD;  Location: [facility];  Service: Cardiovascular;  Laterality: N/A;  . CORONARY STENT INTERVENTION N/A 10/21/2017   Procedure: CORONARY STENT INTERVENTION;  Surgeon: Jettie Booze, MD;  Location: [place] [facility];  Service: Cardiovascular;  Laterality: N/A;  . LEFT HEART CATH AND CORS/GRAFTS ANGIOGRAPHY N/A 11/24/2016   Procedure: Left Heart Cath and Cors/Grafts Angiography;  Surgeon: Nelva Bush, MD;  Location: [facility];  Service: Cardiovascular;  Laterality: N/A;  . LEFT HEART CATH AND CORS/GRAFTS ANGIOGRAPHY N/A 12/01/2016   Procedure: Left Heart Cath and Cors/Grafts Angiography;  Surgeon: [name], MD;  Location: [facility];  Service: Cardiovascular;  Laterality: N/A;  . LEFT HEART CATH AND CORS/GRAFTS ANGIOGRAPHY N/A 10/21/2017   Procedure: LEFT HEART CATH AND CORS/GRAFTS ANGIOGRAPHY;  Surgeon: Jettie Booze, MD;  Location: [facility];  Service: Cardiovascular;  Laterality: N/A;  . VASECTOMY      Family History  Problem Relation Age of Onset  . Hyperlipidemia Mother   . Diabetes Mother        paternal grandparents/1 brother  . Hyperlipidemia Father   . Hypertension Father        paternal grandmother/3 brothers/1 sister  . Arthritis Other        mother/father/paternal grandparents  . Breast cancer Maternal Aunt        paternal aunt  . Lung cancer Maternal Aunt   . Heart disease Other        parents/maternal grandparents/ 2 brothers  . Stroke Paternal Grandmother   . Mental retardation Sister   . Colon cancer Neg Hx   . Esophageal cancer  Neg Hx   . Pancreatic cancer Neg Hx   . Stomach cancer Neg Hx   . Liver disease Neg Hx   . Colon polyps Neg Hx   . Rectal cancer Neg Hx     Social History:  reports that he has never smoked. He has never used smokeless tobacco. He reports previous alcohol use. He reports that he does not use drugs.    Review of Systems    Lipid history: On Lipitor 40 mg, followed by PCP His triglycerides were nearly 410 previously and also taking Vascepa with excellent control  Also has significant history of CAD   Lab Results  Component Value Date   CHOL 99 04/26/2019   HDL 27.90 (L) 04/26/2019   LDLCALC 58 04/26/2019   LDLDIRECT 72.0 11/04/2016   TRIG 67.0 04/26/2019   CHOLHDL 4 04/26/2019            RENAL dysfunction: Followed by nephrologist, renal function is slightly better  Lab Results  Component Value Date   CREATININE 1.69 (H) 05/03/2020   CREATININE 1.53 (H) 01/03/2020   CREATININE 1.77 (H) 09/27/2019      Hypertension:taking 5 mg lisinopril with 25 mg metoprolol  Followed by cardiologist and  blood pressure is usually normal  BP Readings from Last 3 Encounters:  05/09/20 110/64  05/02/20 (!) 143/65  03/20/20 111/63    Diabetic foot exam  in 6/21 shows normal monofilament sensation in the toes and plantar surfaces, no skin lesions or ulcers on the feet and absent pedal pulses    Physical Examination:  BP 110/64   Pulse 60   Ht 5\' 3"  (1.6 m)   Wt 138 lb (62.6 kg)   SpO2 96%   BMI 24.45 kg/m   No pedal edema   ASSESSMENT:  Diabetes type 2, with BMI 25; and abdominal obesity   See history of present illness for detailed discussion of current diabetes management, blood sugar patterns and problems identified  A1c is 7.2  Although A1c is relatively higher he was probably having some low sugars previously with using Metformin Blood sugars being checked after meals and difficult to know if he is still having high readings after meals at times Today with eating cereal alone blood sugars are relatively higher in the office No side effects from Actos He is usually taking Prandin 3 times daily as prescribed  RENAL insufficiency: Overall stable    PLAN:    Needs to start checking blood sugar readings after meals consistently Discussed adding protein with every meal especially breakfast  Follow-up in 3 months Influenza vaccine given    There are no Patient Instructions on file for this visit.   Elayne Snare 05/09/2020, 9:26 AM   Note: This office note was prepared with Dragon voice recognition system technology. Any transcriptional errors that result from this process are unintentional.

## 2020-05-17 ENCOUNTER — Encounter: Payer: Self-pay | Admitting: Adult Health

## 2020-05-17 DIAGNOSIS — J302 Other seasonal allergic rhinitis: Secondary | ICD-10-CM

## 2020-05-18 ENCOUNTER — Other Ambulatory Visit: Payer: Self-pay

## 2020-05-18 ENCOUNTER — Other Ambulatory Visit: Payer: Self-pay | Admitting: Internal Medicine

## 2020-05-18 DIAGNOSIS — B20 Human immunodeficiency virus [HIV] disease: Secondary | ICD-10-CM

## 2020-05-18 MED ORDER — TIVICAY 50 MG PO TABS: ORAL_TABLET | ORAL | 5 refills | Status: AC

## 2020-05-18 MED ORDER — TIVICAY 50 MG PO TABS: ORAL_TABLET | ORAL | 5 refills | Status: DC

## 2020-05-18 MED ORDER — DORAVIRINE 100 MG PO TABS: ORAL_TABLET | ORAL | 5 refills | Status: AC

## 2020-05-18 MED ORDER — DORAVIRINE 100 MG PO TABS: ORAL_TABLET | ORAL | 5 refills | Status: DC

## 2020-05-22 ENCOUNTER — Other Ambulatory Visit: Payer: Self-pay

## 2020-05-22 ENCOUNTER — Ambulatory Visit
Admission: RE | Admit: 2020-05-22 | Discharge: 2020-05-22 | Disposition: A | Discharge status: Home / Self Care | Payer: Medicare Other | Source: Ambulatory Visit | Attending: Sports Medicine | Admitting: Sports Medicine

## 2020-05-22 ENCOUNTER — Ambulatory Visit (INDEPENDENT_AMBULATORY_CARE_PROVIDER_SITE_OTHER): Payer: Medicare Other | Admitting: Sports Medicine

## 2020-05-22 VITALS — BP 122/57 | Ht 63.0 in | Wt 134.0 lb

## 2020-05-22 DIAGNOSIS — G8929 Other chronic pain: Secondary | ICD-10-CM

## 2020-05-22 DIAGNOSIS — M25552 Pain in left hip: Secondary | ICD-10-CM | POA: Diagnosis not present

## 2020-05-22 DIAGNOSIS — I2510 Atherosclerotic heart disease of native coronary artery without angina pectoris: Secondary | ICD-10-CM

## 2020-05-22 DIAGNOSIS — I878 Other specified disorders of veins: Secondary | ICD-10-CM | POA: Diagnosis not present

## 2020-05-22 DIAGNOSIS — M545 Low back pain, unspecified: Secondary | ICD-10-CM

## 2020-05-22 DIAGNOSIS — I739 Peripheral vascular disease, unspecified: Secondary | ICD-10-CM | POA: Diagnosis not present

## 2020-05-22 DIAGNOSIS — M1612 Unilateral primary osteoarthritis, left hip: Secondary | ICD-10-CM | POA: Diagnosis not present

## 2020-05-23 ENCOUNTER — Encounter: Payer: Self-pay | Admitting: Sports Medicine

## 2020-05-23 ENCOUNTER — Telehealth: Payer: Self-pay

## 2020-05-23 NOTE — Progress Notes (Signed)
   Subjective:    Patient ID: [name], male    DOB: October 28, 1945, 74 y.o.   MRN: [id number]  HPI chief complaint: Low back and left-sided hip pain  Patient is a very pleasant 74 year old male that comes in today complaining of left-sided posterior hip pain and low back pain after a fall on September 29.  While shopping at [employer], he slipped on some sort of liquid.  He does not remember exactly how he landed.  He was seen in a local urgent care and diagnosed with a contusion.  No imaging was done.  Since that time his pain has persisted.  He has a history of arthritis diffusely throughout his body including his left hip and low back.  He typically takes extra strength Tylenol for that which has been helpful.  He cannot take NSAIDs due to a history of acute renal failure.  His symptoms are much better on days when he is active and when the weather is warm.  He did not notice any bruising or swelling at the time of his injury.  He denies any groin pain.  No numbness or tingling in his legs.  No prior hip or low back surgery.  Past medical history is reviewed Medications reviewed Allergies reviewed    Review of Systems    As above Objective:   Physical Exam  Well-developed, well-nourished.  No acute distress  Lumbar spine: Patient has good lumbar range of motion but has pain both with forward flexion and extension.  He has no tenderness to palpation along the lumbar midline.  Some slight tenderness to palpation over the left sacrum.  No paraspinal musculatures spasm.  Left hip: Smooth painless hip range of motion with a negative logroll.  No ecchymosis.  No swelling.  Mild tenderness to palpation along the posterior hip but nothing focal.  He is neurovascularly intact distally.  X-rays of his lumbar spine shows mild diffuse degenerative changes but nothing acute. X-rays of his sacrum show no obvious fracture. X-rays of the left hip show mild degenerative changes but nothing acute.       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  Patient is reassured that his x-rays show no fracture.  He may continue with extra strength Tylenol as needed for pain and I have encouraged him to continue to be active.  He enjoys walking daily and I think that will help with his pain.  If symptoms persist or worsen, he will return to the office for reevaluation and consideration of further work-up.  Otherwise, follow-up as needed.

## 2020-05-23 NOTE — Telephone Encounter (Signed)
Informed the pt that his x-rays of his low back and hip show no fracture. He does have some mild arthritis which he already knew about. Encouraged him to continue to stay active. Let him know that he may stay sore for the next week or 2 but his symptoms should improve over time. Follow-up if that is not the case.

## 2020-05-27 ENCOUNTER — Inpatient Hospital Stay (HOSPITAL_COMMUNITY)
Admission: EM | Admit: 2020-05-27 | Discharge: 2020-05-29 | DRG: 250 | Disposition: A | Discharge status: Home / Self Care | Payer: Medicare Other | Attending: Internal Medicine | Admitting: Internal Medicine

## 2020-05-27 ENCOUNTER — Encounter (HOSPITAL_COMMUNITY): Payer: Self-pay | Admitting: Internal Medicine

## 2020-05-27 ENCOUNTER — Other Ambulatory Visit: Payer: Self-pay

## 2020-05-27 ENCOUNTER — Emergency Department (HOSPITAL_COMMUNITY): Payer: Medicare Other

## 2020-05-27 DIAGNOSIS — E1151 Type 2 diabetes mellitus with diabetic peripheral angiopathy without gangrene: Secondary | ICD-10-CM | POA: Diagnosis present

## 2020-05-27 DIAGNOSIS — I214 Non-ST elevation (NSTEMI) myocardial infarction: Secondary | ICD-10-CM | POA: Diagnosis not present

## 2020-05-27 DIAGNOSIS — Z7982 Long term (current) use of aspirin: Secondary | ICD-10-CM

## 2020-05-27 DIAGNOSIS — Z20822 Contact with and (suspected) exposure to covid-19: Secondary | ICD-10-CM | POA: Diagnosis not present

## 2020-05-27 DIAGNOSIS — N1832 Chronic kidney disease, stage 3b: Secondary | ICD-10-CM

## 2020-05-27 DIAGNOSIS — Z79899 Other long term (current) drug therapy: Secondary | ICD-10-CM

## 2020-05-27 DIAGNOSIS — Z833 Family history of diabetes mellitus: Secondary | ICD-10-CM

## 2020-05-27 DIAGNOSIS — J449 Chronic obstructive pulmonary disease, unspecified: Secondary | ICD-10-CM | POA: Diagnosis present

## 2020-05-27 DIAGNOSIS — K219 Gastro-esophageal reflux disease without esophagitis: Secondary | ICD-10-CM | POA: Diagnosis not present

## 2020-05-27 DIAGNOSIS — N400 Enlarged prostate without lower urinary tract symptoms: Secondary | ICD-10-CM | POA: Diagnosis present

## 2020-05-27 DIAGNOSIS — B20 Human immunodeficiency virus [HIV] disease: Secondary | ICD-10-CM | POA: Diagnosis present

## 2020-05-27 DIAGNOSIS — Z8249 Family history of ischemic heart disease and other diseases of the circulatory system: Secondary | ICD-10-CM

## 2020-05-27 DIAGNOSIS — E782 Mixed hyperlipidemia: Secondary | ICD-10-CM | POA: Diagnosis not present

## 2020-05-27 DIAGNOSIS — N179 Acute kidney failure, unspecified: Secondary | ICD-10-CM

## 2020-05-27 DIAGNOSIS — I25810 Atherosclerosis of coronary artery bypass graft(s) without angina pectoris: Secondary | ICD-10-CM | POA: Diagnosis present

## 2020-05-27 DIAGNOSIS — I130 Hypertensive heart and chronic kidney disease with heart failure and stage 1 through stage 4 chronic kidney disease, or unspecified chronic kidney disease: Secondary | ICD-10-CM | POA: Diagnosis not present

## 2020-05-27 DIAGNOSIS — I5032 Chronic diastolic (congestive) heart failure: Secondary | ICD-10-CM | POA: Diagnosis not present

## 2020-05-27 DIAGNOSIS — E1122 Type 2 diabetes mellitus with diabetic chronic kidney disease: Secondary | ICD-10-CM | POA: Diagnosis not present

## 2020-05-27 DIAGNOSIS — Z955 Presence of coronary angioplasty implant and graft: Secondary | ICD-10-CM

## 2020-05-27 DIAGNOSIS — I10 Essential (primary) hypertension: Secondary | ICD-10-CM | POA: Diagnosis not present

## 2020-05-27 DIAGNOSIS — I2582 Chronic total occlusion of coronary artery: Secondary | ICD-10-CM | POA: Diagnosis present

## 2020-05-27 DIAGNOSIS — Z7902 Long term (current) use of antithrombotics/antiplatelets: Secondary | ICD-10-CM

## 2020-05-27 DIAGNOSIS — T82855A Stenosis of coronary artery stent, initial encounter: Principal | ICD-10-CM | POA: Diagnosis present

## 2020-05-27 DIAGNOSIS — Z83438 Family history of other disorder of lipoprotein metabolism and other lipidemia: Secondary | ICD-10-CM

## 2020-05-27 DIAGNOSIS — I2510 Atherosclerotic heart disease of native coronary artery without angina pectoris: Secondary | ICD-10-CM

## 2020-05-27 DIAGNOSIS — Z794 Long term (current) use of insulin: Secondary | ICD-10-CM

## 2020-05-27 DIAGNOSIS — I252 Old myocardial infarction: Secondary | ICD-10-CM

## 2020-05-27 DIAGNOSIS — Y831 Surgical operation with implant of artificial internal device as the cause of abnormal reaction of the patient, or of later complication, without mention of misadventure at the time of the procedure: Secondary | ICD-10-CM | POA: Diagnosis present

## 2020-05-27 LAB — CBC
HCT: 38.6 % — ABNORMAL LOW (ref 39.0–52.0)
Hemoglobin: 12.7 g/dL — ABNORMAL LOW (ref 13.0–17.0)
MCH: 32.1 pg (ref 26.0–34.0)
MCHC: 32.9 g/dL (ref 30.0–36.0)
MCV: 97.5 fL (ref 80.0–100.0)
Platelets: 182 10*3/uL (ref 150–400)
RBC: 3.96 MIL/uL — ABNORMAL LOW (ref 4.22–5.81)
RDW: 13.1 % (ref 11.5–15.5)
WBC: 5 10*3/uL (ref 4.0–10.5)
nRBC: 0 % (ref 0.0–0.2)

## 2020-05-27 LAB — BASIC METABOLIC PANEL
Anion gap: 8 (ref 5–15)
BUN: 21 mg/dL (ref 8–23)
CO2: 23 mmol/L (ref 22–32)
Calcium: 8.8 mg/dL — ABNORMAL LOW (ref 8.9–10.3)
Chloride: 106 mmol/L (ref 98–111)
Creatinine, Ser: 1.71 mg/dL — ABNORMAL HIGH (ref 0.61–1.24)
GFR, Estimated: 42 mL/min — ABNORMAL LOW (ref >60)
Glucose, Bld: 212 mg/dL — ABNORMAL HIGH (ref 70–99)
Potassium: 4.2 mmol/L (ref 3.5–5.1)
Sodium: 137 mmol/L (ref 135–145)

## 2020-05-27 LAB — RESPIRATORY PANEL BY RT PCR (FLU A&B, COVID)
Influenza A by PCR: NEGATIVE
Influenza B by PCR: NEGATIVE
SARS Coronavirus 2 by RT PCR: NEGATIVE

## 2020-05-27 LAB — TROPONIN I (HIGH SENSITIVITY)
Troponin I (High Sensitivity): 154 ng/L (ref <18)
Troponin I (High Sensitivity): 188 ng/L (ref <18)

## 2020-05-27 LAB — CBG MONITORING, ED: Glucose-Capillary: 168 mg/dL — ABNORMAL HIGH (ref 70–99)

## 2020-05-27 MED ORDER — FUROSEMIDE 40 MG PO TABS: 40.0000 mg | ORAL_TABLET | ORAL | Status: DC

## 2020-05-27 MED ORDER — ATORVASTATIN CALCIUM 40 MG PO TABS: 40.0000 mg | ORAL_TABLET | Freq: Every evening | ORAL | Status: DC

## 2020-05-27 MED ORDER — ACETAMINOPHEN 325 MG PO TABS: 650.0000 mg | ORAL_TABLET | ORAL | Status: DC | PRN

## 2020-05-27 MED ORDER — VALACYCLOVIR HCL 500 MG PO TABS
500.0000 mg | ORAL_TABLET | Freq: Every day | ORAL | Status: DC
  Administered 2020-05-28 – 2020-05-29 (×2): 500 mg via ORAL
  Filled 2020-05-27 (×2): qty 1

## 2020-05-27 MED ORDER — HEPARIN BOLUS VIA INFUSION
3500.0000 [IU] | Freq: Once | INTRAVENOUS | Status: AC
  Administered 2020-05-27: 3500 [IU] via INTRAVENOUS
  Filled 2020-05-27: qty 3500

## 2020-05-27 MED ORDER — RISAQUAD PO CAPS
1.0000 | ORAL_CAPSULE | Freq: Every day | ORAL | Status: DC
  Administered 2020-05-28 – 2020-05-29 (×2): 1 via ORAL
  Filled 2020-05-27 (×2): qty 1

## 2020-05-27 MED ORDER — HEPARIN (PORCINE) 25000 UT/250ML-% IV SOLN
850.0000 [IU]/h | INTRAVENOUS | Status: DC
  Administered 2020-05-27: 750 [IU]/h via INTRAVENOUS
  Filled 2020-05-27: qty 250

## 2020-05-27 MED ORDER — DOLUTEGRAVIR SODIUM 50 MG PO TABS
50.0000 mg | ORAL_TABLET | Freq: Every day | ORAL | Status: DC
  Administered 2020-05-28 – 2020-05-29 (×2): 50 mg via ORAL
  Filled 2020-05-27 (×2): qty 1

## 2020-05-27 MED ORDER — DORAVIRINE 100 MG PO TABS
100.0000 mg | ORAL_TABLET | Freq: Every day | ORAL | Status: DC
  Administered 2020-05-28 – 2020-05-29 (×2): 100 mg via ORAL
  Filled 2020-05-27 (×2): qty 1

## 2020-05-27 MED ORDER — NITROGLYCERIN IN D5W 200-5 MCG/ML-% IV SOLN
0.0000 ug/min | INTRAVENOUS | Status: DC
  Administered 2020-05-27: 5 ug/min via INTRAVENOUS
  Filled 2020-05-27: qty 250

## 2020-05-27 MED ORDER — PANTOPRAZOLE SODIUM 40 MG PO TBEC
40.0000 mg | DELAYED_RELEASE_TABLET | Freq: Every day | ORAL | Status: DC
  Administered 2020-05-28 – 2020-05-29 (×2): 40 mg via ORAL
  Filled 2020-05-27 (×2): qty 1

## 2020-05-27 MED ORDER — INSULIN ASPART 100 UNIT/ML ~~LOC~~ SOLN
0.0000 [IU] | Freq: Three times a day (TID) | SUBCUTANEOUS | Status: DC
  Administered 2020-05-27: 3 [IU] via SUBCUTANEOUS
  Administered 2020-05-28 (×3): 2 [IU] via SUBCUTANEOUS

## 2020-05-27 MED ORDER — FUROSEMIDE 20 MG PO TABS: 20.0000 mg | ORAL_TABLET | ORAL | Status: DC

## 2020-05-27 MED ORDER — FENOFIBRATE 54 MG PO TABS
54.0000 mg | ORAL_TABLET | Freq: Every day | ORAL | Status: DC
  Administered 2020-05-27 – 2020-05-28 (×2): 54 mg via ORAL
  Filled 2020-05-27 (×3): qty 1

## 2020-05-27 MED ORDER — TAMSULOSIN HCL 0.4 MG PO CAPS
0.4000 mg | ORAL_CAPSULE | Freq: Two times a day (BID) | ORAL | Status: DC
  Administered 2020-05-27 – 2020-05-29 (×4): 0.4 mg via ORAL
  Filled 2020-05-27 (×4): qty 1

## 2020-05-27 MED ORDER — SODIUM BICARBONATE 650 MG PO TABS
650.0000 mg | ORAL_TABLET | Freq: Three times a day (TID) | ORAL | Status: DC
  Administered 2020-05-27 – 2020-05-29 (×4): 650 mg via ORAL
  Filled 2020-05-27 (×4): qty 1

## 2020-05-27 MED ORDER — NITROGLYCERIN 0.4 MG SL SUBL
0.4000 mg | SUBLINGUAL_TABLET | SUBLINGUAL | Status: DC | PRN
  Administered 2020-05-27 (×3): 0.4 mg via SUBLINGUAL
  Filled 2020-05-27: qty 1

## 2020-05-27 MED ORDER — METOPROLOL TARTRATE 12.5 MG HALF TABLET
12.5000 mg | ORAL_TABLET | Freq: Two times a day (BID) | ORAL | Status: DC
  Administered 2020-05-27 – 2020-05-29 (×4): 12.5 mg via ORAL
  Filled 2020-05-27 (×4): qty 1

## 2020-05-27 MED ORDER — ONDANSETRON HCL 4 MG/2ML IJ SOLN: 4.0000 mg | Freq: Four times a day (QID) | INTRAMUSCULAR | Status: DC | PRN

## 2020-05-27 MED ORDER — ASPIRIN 325 MG PO TABS
325.0000 mg | ORAL_TABLET | Freq: Every day | ORAL | Status: DC
  Filled 2020-05-27: qty 1

## 2020-05-27 MED ORDER — ICOSAPENT ETHYL 1 G PO CAPS
2.0000 g | ORAL_CAPSULE | Freq: Two times a day (BID) | ORAL | Status: DC
  Administered 2020-05-27 – 2020-05-29 (×4): 2 g via ORAL
  Filled 2020-05-27 (×5): qty 2

## 2020-05-27 MED ORDER — ASPIRIN 81 MG PO CHEW
324.0000 mg | CHEWABLE_TABLET | Freq: Once | ORAL | Status: AC
  Administered 2020-05-27: 324 mg via ORAL
  Filled 2020-05-27: qty 4

## 2020-05-27 MED ORDER — LISINOPRIL 5 MG PO TABS: 5.0000 mg | ORAL_TABLET | Freq: Every day | ORAL | Status: DC

## 2020-05-27 NOTE — H&P (Addendum)
History and Physical    [name]:[id number] DOB: April 18, 1946 DOA: 05/27/2020  PCP: Dorothyann Peng, NP  Patient coming from: Home - by private vehicle   Chief Complaint:  Chief Complaint  Patient presents with  . Chest Pain     HPI:    74 year old male with past medical history of HIV, coronary artery disease (S/P CABG 2003, PCI of graft to ramus and OM branches 11/2016, PCI in 10/2017 with 3 additional DES), chronic diastolic congestive heart failure (Echo 01/2020 EF 60-65%), mild AS , diabetes mellitus type 2 (last A1C 7.2 05/2020), HIV, hyperlipidemia and COPD who presents to [facility] emergency department with complaints of chest discomfort.  Patient explains that earlier in the afternoon on 10/24 he was driving in his car to go pick up some friends when he suddenly began to experience chest discomfort.  This chest pain was sharp in quality, midsternal in location, nonradiating and moderate to severe in intensity.  Discomfort lasted for several hours, associated with shortness of breath and diaphoresis.  Patient took 2 sublingual nitroglycerin in route to the emergency department with some improvement in symptoms.  Upon arrival to the emergency department, patient was found to continue to experience chest discomfort and therefore 3 additional doses of sublingual nitroglycerin were administered throughout his course in the emergency department each resulting in some improvement in his discomfort.  Initial emergency department work-up revealed a somewhat elevated troponin of 154.  EKG revealed no dynamic ST segment changes.  Chest x-ray was unremarkable.  Case was discussed with Dr. Golden Hurter with cardiology who recommended medicine admit patient for ACS work-up.  Heparin drip was initiated.  The hospitalist group was then called to assess the patient for admission to the hospital.    Review of Systems:   Review of Systems  Respiratory: Positive for shortness of breath.     Cardiovascular: Positive for chest pain.  All other systems reviewed and are negative.   Past Medical History:  Diagnosis Date  . Absolute anemia 05/28/2014  . Aortic stenosis 09/17/2015  . Arthritis   . Ascites 11/18/2013  . BELLS PALSY 07/19/2010   Qualifier: Diagnosis of  By: [name], [name]    . CAD (coronary artery disease)    a. s/p CABG in 2003 b. s/p PCI to SVG-PDA in 07/2015 c. 11/2016: cath showing severe native CAD with patent LIMA-LAD and SVG-D1 with 80% stenosis of SVG-OM1-OM2. Initially medical management was recommended --> presented with recurrent angina --> s/p Synergy DES to proximal body of SVG-OM1-OM2, POBA to distal graft.   Marland Kitchen CAD- S/P PCI SVG-OM1 12/01/16 05/12/2006   Qualifier: Diagnosis of  By: [name], [name]    . Carotid bruit 07/10/2015  . Chest pain 11/22/2016  . Chronic kidney disease   . Chronic renal insufficiency, stage III (moderate) ([place]) 12/02/2016  . Constipation 05/28/2014  . COPD 07/20/2008   Qualifier: Diagnosis of  By: [name], [name]   . DEPRESSION 09/03/2006   Qualifier: Diagnosis of  By: [name], [name]    . Dermatitis 11/27/2012  . Diabetes mellitus   . Diarrhea 11/20/2013  . DM (diabetes mellitus), type 2 with ophthalmic complications ([place]) 10/17/4006   Qualifier: Diagnosis of  By: [name], [name]    . Dry eye syndrome 12/20/2010  . Epicondylitis 06/05/2013   right  . Erectile dysfunction 01/01/2012  . Essential hypertension 05/12/2006   Qualifier: Diagnosis of  By: [name], [name]    . GENITAL HERPES 05/03/2009  Qualifier: Diagnosis of  By: [name], [name]    . GERD 09/03/2006   Qualifier: Diagnosis of  By: [name], [name]    . GERD (gastroesophageal reflux disease)   . HEARING LOSS, SENSORINEURAL 05/12/2006   Qualifier: Diagnosis of  By: [name], [name]    . HEMATOCHEZIA 04/18/2008   Annotation: 9/09 during bout of constipation Qualifier: Diagnosis of  By: [name], [name]    . HIP PAIN, BILATERAL 07/17/2008   Qualifier:  Diagnosis of  By: [name], [name]   . History of depression   . History of kidney stones   . HIV (human immunodeficiency virus infection) ([place]) 1991   on meds since initial dx.   Marland Kitchen HLD (hyperlipidemia) 11/19/2013  . Human immunodeficiency virus (HIV) disease ([place]) 05/12/2006   Qualifier: Diagnosis of  By: [name], [name]    . Hx of CABG 09/03/2006   Annotation: 2003 Qualifier: Diagnosis of  By: [name], [name]    . Hyperkalemia 03/23/2014  . Hyperlipidemia   . Hypertension   . Hyponatremia 03/23/2014  . INGUINAL LYMPHADENOPATHY, RIGHT 04/03/2009   Qualifier: Diagnosis of  By: [name], [name]    . Keratoma 01/24/2015  . KNEE PAIN, BILATERAL 07/17/2008   Qualifier: Diagnosis of  By: [name], [name]   . Lesion of breast 07/21/2015  . Lipodystrophy 12/20/2010  . Memory loss 09/18/2008   Qualifier: Diagnosis of  By: [name], [name]   . Metatarsal deformity 01/24/2015  . Nasal abscess 12/04/2015  . Night sweats 07/06/2012  . Nocturia 03/06/2013  . NSTEMI (non-ST elevated myocardial infarction) ([place]) 11/29/2016  . Pain in joint, ankle and foot 01/19/2015  . Pancreatitis 11/2013   attributed to HIV meds.   . Pedal edema 05/21/2014  . PERIPHERAL VASCULAR DISEASE 07/20/2008   Qualifier: Diagnosis of  By: [name], [name]   . Posterior cervical lymphadenopathy 03/30/2014  . Protein-calorie malnutrition, severe ([place]) 03/23/2014  . SHINGLES, HX OF 05/03/2009   Annotation: R leg Qualifier: Diagnosis of  By: [name], [name]    . STEMI (ST elevation myocardial infarction) ([place]) 07/29/2015  . Unstable angina ([place]) 11/24/2016  . WEIGHT LOSS, ABNORMAL 04/03/2009   Qualifier: Diagnosis of  By: [name], [name]      Past Surgical History:  Procedure Laterality Date  . CARDIAC CATHETERIZATION N/A 07/29/2015   Procedure: Left Heart Cath and Coronary Angiography;  Surgeon: [name], MD;  Location: [place] [facility];  Service: Cardiovascular;  Laterality: N/A;  . CARDIAC CATHETERIZATION N/A  07/29/2015   Procedure: Coronary Stent Intervention;  Surgeon: [name], MD;  Location: [place] [facility];  Service: Cardiovascular;  Laterality: N/A;  . COLONOSCOPY  12/02/2019  . CORONARY ARTERY BYPASS GRAFT  05/2002  . CORONARY STENT INTERVENTION N/A 12/01/2016   Procedure: Coronary Stent Intervention;  Surgeon: [name], MD;  Location: [place] [facility];  Service: Cardiovascular;  Laterality: N/A;  . CORONARY STENT INTERVENTION N/A 10/21/2017   Procedure: CORONARY STENT INTERVENTION;  Surgeon: Jettie Booze, MD;  Location: [place] [facility];  Service: Cardiovascular;  Laterality: N/A;  . LEFT HEART CATH AND CORS/GRAFTS ANGIOGRAPHY N/A 11/24/2016   Procedure: Left Heart Cath and Cors/Grafts Angiography;  Surgeon: Nelva Bush, MD;  Location: [place] [facility];  Service: Cardiovascular;  Laterality: N/A;  . LEFT HEART CATH AND CORS/GRAFTS ANGIOGRAPHY N/A 12/01/2016   Procedure: Left Heart Cath and Cors/Grafts Angiography;  Surgeon: [name], MD;  Location: [place] [facility];  Service: Cardiovascular;  Laterality: N/A;  . LEFT HEART CATH AND CORS/GRAFTS ANGIOGRAPHY N/A 10/21/2017   Procedure: LEFT HEART CATH AND CORS/GRAFTS ANGIOGRAPHY;  Surgeon: Jettie Booze, MD;  Location: [place] [facility];  Service: Cardiovascular;  Laterality: N/A;  . VASECTOMY       reports that he has never smoked. He has never used smokeless tobacco. He reports previous alcohol use. He reports that he does not use drugs.  No Known Allergies  Family History  Problem Relation Age of Onset  . Hyperlipidemia Mother   . Diabetes Mother        paternal grandparents/1 brother  . Hyperlipidemia Father   . Hypertension Father        paternal grandmother/3 brothers/1 sister  . Arthritis Other        mother/father/paternal grandparents  . Breast cancer Maternal Aunt        paternal aunt  . Lung cancer Maternal Aunt   . Heart disease Other        parents/maternal grandparents/  2 brothers  . Stroke Paternal Grandmother   . Mental retardation Sister   . Colon cancer Neg Hx   . Esophageal cancer Neg Hx   . Pancreatic cancer Neg Hx   . Stomach cancer Neg Hx   . Liver disease Neg Hx   . Colon polyps Neg Hx   . Rectal cancer Neg Hx      Prior to Admission medications   Medication Sig Start Date End Date Taking? Authorizing Provider  acetaminophen (TYLENOL) 325 MG tablet Take 2 tablets (650 mg total) by mouth every 4 (four) hours as needed for headache or mild pain. 12/02/16  Yes Kilroy, Doreene Burke, PA-C  aspirin 81 MG chewable tablet Chew 81 mg by mouth at bedtime.    Yes [provider]  atorvastatin (LIPITOR) 20 MG tablet TAKE 1 TABLET(20 MG) BY MOUTH DAILY Patient taking differently: Take 20 mg by mouth at bedtime.  02/13/20  Yes Michel Bickers, MD  b complex vitamins tablet Take 1 tablet by mouth daily. Patient taking differently: Take 1 tablet by mouth daily with breakfast.  09/25/16  Yes Mosie Lukes, MD  Cholecalciferol (VITAMIN D3) 50 MCG (2000 UT) TABS Take 2,000 Units by mouth in the morning.   Yes [provider]  dolutegravir (TIVICAY) 50 MG tablet TAKE 1 TABLET BY MOUTH DAILY with Pifeltro Patient taking differently: Take 50 mg by mouth See admin instructions. Take 50 mg by mouth in the morning (with Pifeltro) 05/18/20  Yes Michel Bickers, MD  doravirine (PIFELTRO) 100 MG TABS tablet TAKE 1 TABLET(100 MG) BY MOUTH DAILY with Tivicay Patient taking differently: Take 100 mg by mouth See admin instructions. Take 100 mg by mouth in the morning (with Tivicay) 05/18/20  Yes Michel Bickers, MD  fenofibrate 54 MG tablet TAKE 1 TABLET(54 MG) BY MOUTH DAILY Patient taking differently: Take 54 mg by mouth at bedtime.  09/28/19  Yes Almyra Deforest, PA  furosemide (LASIX) 20 MG tablet Take 2 tablets (40 mg total) by mouth daily. Patient taking differently: Take 20-40 mg by mouth See admin instructions. Take 20 mg by mouth in the morning on Sun/Tues/Thurs/Sat  and 40 mg on Mon/Wed/Fri 01/10/20  Yes Nafziger, Tommi Rumps, NP  isosorbide mononitrate (IMDUR) 30 MG 24 hr tablet Take 1 tablet (30 mg total) by mouth daily. 01/16/20  Yes Lelon Perla, MD  lisinopril (ZESTRIL) 5 MG tablet TAKE 1 TABLET(5 MG) BY MOUTH DAILY  Patient taking differently: Take 5 mg by mouth daily.  11/11/19  Yes Barrett, Evelene Croon, PA-C  metoprolol tartrate (LOPRESSOR) 25 MG tablet Take 0.5 tablets (12.5 mg total) by mouth 2 (two) times daily. NEED APPOINTMENT FOR FUTURE REFILL Patient taking differently: Take 12.5 mg by mouth 2 (two) times daily.  02/13/20  Yes Lelon Perla, MD  nitroGLYCERIN (NITROSTAT) 0.4 MG SL tablet PLACE 1 TABLET UNDER THE TONGUE EVERY 5 MINUTES AS NEEDED FOR CHEST PAIN Patient taking differently: Place 0.4 mg under the tongue every 5 (five) minutes as needed for chest pain.  03/19/20  Yes Lelon Perla, MD  pantoprazole (PROTONIX) 40 MG tablet TAKE 1 TABLET(40 MG) BY MOUTH DAILY Patient taking differently: Take 40 mg by mouth daily before breakfast.  10/12/19  Yes Crenshaw, Denice Bors, MD  potassium chloride SA (KLOR-CON) 20 MEQ tablet TAKE 1 TABLET(20 MEQ) BY MOUTH THREE TIMES DAILY Patient taking differently: Take 20 mEq by mouth 3 (three) times daily.  11/29/19  Yes Nafziger, Tommi Rumps, NP  Probiotic Product (PROBIOTIC DAILY) CAPS Take 1 by mouth daily Patient taking differently: Take 1 capsule by mouth daily with breakfast.  09/25/16  Yes Mosie Lukes, MD  repaglinide (PRANDIN) 2 MG tablet Take 2-4 mg by mouth See admin instructions. Take 4 mg by mouth in the morning before breakfast and 2 mg before lunch and dinner/evening meal   Yes [provider]  sodium bicarbonate 650 MG tablet Take 650 mg by mouth 3 (three) times daily.   Yes [provider]  tamsulosin (FLOMAX) 0.4 MG CAPS capsule Take 0.4 mg by mouth in the morning and at bedtime.  05/19/18  Yes [provider]  valACYclovir (VALTREX) 500 MG tablet TAKE 1 TABLET BY MOUTH  DAILY Patient taking differently: Take 500 mg by mouth in the morning.  03/27/20  Yes Michel Bickers, MD  VASCEPA 1 g capsule TAKE 2 CAPSULES(2 GRAMS) BY MOUTH TWICE DAILY Patient taking differently: Take 2 g by mouth 2 (two) times daily.  03/16/20  Yes Elayne Snare, MD  Cholecalciferol 2000 units CAPS Take 1 capsule (2,000 Units total) by mouth daily. Patient not taking: Reported on 05/27/2020 09/25/16   Mosie Lukes, MD  glucose blood (FREESTYLE TEST STRIPS) test strip USE TO CHECK BLOOD SUGAR THREE TIMES DAILY 11/18/19   Elayne Snare, MD  Lancets (FREESTYLE) lancets Use as directed three times a day to check blood sugar.  DX E11.9 11/03/17   Elayne Snare, MD  pioglitazone (ACTOS) 15 MG tablet TAKE 1 TABLET(15 MG) BY MOUTH DAILY Patient not taking: Reported on 05/27/2020 05/13/19   Elayne Snare, MD    Physical Exam: Vitals:   05/27/20 2015 05/27/20 2030 05/27/20 2100 05/27/20 2115  BP: (!) 149/70 (!) 153/80 (!) 165/76 (!) 177/75  Pulse: (!) 57 (!) 57 60 (!) 57  Resp: 14 14 17 17   Temp:      SpO2: 96% 100% 100% 99%  Weight:      Height:        Constitutional: Acute alert and oriented x3, patient is in mild distress due to chest discomfort.  Skin: no rashes, no lesions, good skin turgor noted. Eyes: Pupils are equally reactive to light.  No evidence of scleral icterus or conjunctival pallor.  ENMT: Moist mucous membranes noted.  Posterior pharynx clear of any exudate or lesions.   Neck: normal, supple, no masses, no thyromegaly.  No evidence of jugular venous distension.   Respiratory: Notable mild right basilar rales without  evidence of concurrent wheezing. Normal respiratory effort. No accessory muscle use.  Cardiovascular: Regular rate and rhythm, no murmurs / rubs / gallops. No extremity edema. 2+ pedal pulses. No carotid bruits.  Chest:   Nontender without crepitus or deformity.   Back:   Nontender without crepitus or deformity. Abdomen: Abdomen is soft and nontender.  No evidence of  intra-abdominal masses.  Positive bowel sounds noted in all quadrants.   Musculoskeletal: No joint deformity upper and lower extremities. Good ROM, no contractures. Normal muscle tone.  Neurologic: CN 2-12 grossly intact. Sensation intact.  Patient moving all 4 extremities spontaneously.  Patient is following all commands.  Patient is responsive to verbal stimuli.   Psychiatric: Patient exhibits normal mood with appropriate affect.  Patient seems to possess insight as to their current situation.     Labs on Admission: I have personally reviewed following labs and imaging studies -   CBC: Recent Labs  Lab 05/27/20 1830  WBC 5.0  HGB 12.7*  HCT 38.6*  MCV 97.5  PLT 161   Basic Metabolic Panel: Recent Labs  Lab 05/27/20 1830  NA 137  K 4.2  CL 106  CO2 23  GLUCOSE 212*  BUN 21  CREATININE 1.71*  CALCIUM 8.8*   GFR: Estimated Creatinine Clearance: 31.4 mL/min (A) (by C-G formula based on SCr of 1.71 mg/dL (H)). Liver Function Tests: No results for input(s): AST, ALT, ALKPHOS, BILITOT, PROT, ALBUMIN in the last 168 hours. No results for input(s): LIPASE, AMYLASE in the last 168 hours. No results for input(s): AMMONIA in the last 168 hours. Coagulation Profile: No results for input(s): INR, PROTIME in the last 168 hours. Cardiac Enzymes: No results for input(s): CKTOTAL, CKMB, CKMBINDEX, TROPONINI in the last 168 hours. BNP (last 3 results) No results for input(s): PROBNP in the last 8760 hours. HbA1C: No results for input(s): HGBA1C in the last 72 hours. CBG: Recent Labs  Lab 05/27/20 2130  GLUCAP 168*   Lipid Profile: No results for input(s): CHOL, HDL, LDLCALC, TRIG, CHOLHDL, LDLDIRECT in the last 72 hours. Thyroid Function Tests: No results for input(s): TSH, T4TOTAL, FREET4, T3FREE, THYROIDAB in the last 72 hours. Anemia Panel: No results for input(s): VITAMINB12, FOLATE, FERRITIN, TIBC, IRON, RETICCTPCT in the last 72 hours. Urine analysis:    Component  Value Date/Time   COLORURINE YELLOW 04/26/2019 0854   APPEARANCEUR CLEAR 04/26/2019 0854   LABSPEC 1.020 04/26/2019 0854   PHURINE 6.0 04/26/2019 0854   GLUCOSEU NEGATIVE 04/26/2019 0854   HGBUR NEGATIVE 04/26/2019 0854   BILIRUBINUR neg 12/08/2019 1015   KETONESUR NEGATIVE 04/26/2019 0854   PROTEINUR Negative 12/08/2019 1015   PROTEINUR 100 (A) 03/23/2014 0256   UROBILINOGEN 0.2 12/08/2019 1015   UROBILINOGEN 0.2 04/26/2019 0854   NITRITE neg 12/08/2019 1015   NITRITE NEGATIVE 04/26/2019 0854   LEUKOCYTESUR Negative 12/08/2019 1015   LEUKOCYTESUR NEGATIVE 04/26/2019 0854    Radiological Exams on Admission - Personally Reviewed: DG Chest 2 View  Result Date: 05/27/2020 CLINICAL DATA:  Chest pain. EXAM: CHEST - 2 VIEW COMPARISON:  Multiple priors, most recent October 20, 2017 FINDINGS: Similar cardiomediastinal silhouette. Postsurgical changes of CABG and median sternotomy. Linear and hazy left basilar opacities, similar over multiple priors and likely related to atelectasis or scar. No new focal consolidation. No pleural effusions or pneumothorax. No acute osseous abnormality. IMPRESSION: Left basilar atelectasis/scar without superimposed acute cardiopulmonary disease. Electronically Signed   By: Margaretha Sheffield MD   On: 05/27/2020 19:13    EKG: Personally  reviewed.  Rhythm is sinus bradycardia with heart rate of 59 bpm.  Notable T wave inversions in the inferior leads.  Question ST segment elevation anterior septal leads with early repolarization pattern  Assessment/Plan Principal Problem:   NSTEMI (non-ST elevated myocardial infarction) [facility])   Patient presenting with several hours of atypical chest discomfort  Patient has known history of extensive coronary artery disease status post CABG and multiple interventions including placement of 3 drug-eluting stents in March 2019  While features are atypical (sharp in quality, occurring at rest) patient reports that the symptoms are  similar to a previous myocardial infarction.  Additionally, patient reports symptomatic improvement with administration of nitroglycerin  Patient found to have elevated high-sensitivity troponin that seems to be trending upward every 3 hours out from the initial with troponins being 154 and 188 respectively.  No clinical evidence of thromboembolic disease.  No suggestion of uncontrolled reflux.  On physical examination chest discomfort is not reproducible.  Therefore, ACS/NSTEMI is the most likely cause of patient's symptoms at this time.  Patient has been initiated on heparin infusion by the emergency department provider which I will continue at this time.  Patient continues to experience chest discomfort, albeit improved with several doses of sublingual nitroglycerin.  We will initiate nitroglycerin infusion  Aspirin 325 mg has been administered.  Metoprolol and statin have also been ordered.  Patient is being mid to the progressive unit.  Case discussed with Dr. Golden Hurter with Cardiology by the emergency department staff who recommended hospital medicine to admit.  Will make n.p.o. after midnight and formally consult cardiology in the morning.  Active Problems:  Coronary artery disease  See notes above for assessment and plan  Complicated history of coronary artery disease status post coronary artery bypass graft surgery in 2003 and multiple cardiac catheterizations since  Most recent intervention was PCI 10/2017 for NSTEMI with placement of 3 drug-eluting stents  Patient follows with Dr. Stanford Breed with cardiology     Human immunodeficiency virus (HIV) disease ([place])   Continue home regimen of HAART therapy  Patient follows with Dr. Megan Salon infectious disease  Per most recent note, patient is disease is well controlled    Essential hypertension   Continue home regimen of antihypertensive therapy    Chronic kidney disease, stage 3b ([place])  . Strict intake and output  monitoring . Creatinine near baseline . Minimizing nephrotoxic agents as much as possible . Serial chemistries to monitor renal function and electrolytes    Type 2 diabetes mellitus with stage 3b chronic kidney disease, without long-term current use of insulin ([place])  . Patient been placed on Accu-Cheks before every meal and nightly with sliding scale insulin . Holding home regimen of oral hypoglycemics . Recent hemoglobin A1c obtained earlier this month is 7.2%. . Diabetic Diet    Chronic diastolic CHF (congestive heart failure) ([facility])   No clinical evidence of cardiogenic volume overload at this time.    GERD without esophagitis   Continue home regimen of PPI    Mixed hyperlipidemia   Continue home regimen of lipid-lowering therapy    Benign prostatic hyperplasia without lower urinary tract symptoms    Continue home regimen of Flomax   Code Status:  Full code Family Communication: deferred   Status is: Observation  The patient remains OBS appropriate and will d/c before 2 midnights.  Dispo: The patient is from: Home              Anticipated d/c is to: Home  Anticipated d/c date is: 2 days              Patient currently is not medically stable to d/c.        Vernelle Emerald MD [facility] Pager [phone]  If 7PM-7AM, please contact night-coverage [email or web address] Use universal [place] password for that web site. If you do not have the password, please call the hospital operator.  05/27/2020, 9:47 PM

## 2020-05-27 NOTE — ED Triage Notes (Signed)
Pt here via pov with reports of sudden onset cp while driving approx 10 minutes ago. Pt states hx 2 MI in the past along with hx bypass. Pt describes the pain as similar to pain with previous MI's.

## 2020-05-27 NOTE — Progress Notes (Signed)
ANTICOAGULATION CONSULT NOTE  Pharmacy Consult for Heparin Indication: chest pain/ACS  No Known Allergies  Patient Measurements: Height: 5\' 3"  (160 cm) Weight: 60.8 kg (134 lb) IBW/kg (Calculated) : 56.9 Heparin Dosing Weight: 60.8 kg  Vital Signs: Temp: 98.5 F (36.9 C) (10/24 1814) BP: 194/90 (10/24 1947) Pulse Rate: 56 (10/24 1947)  Labs: Recent Labs    05/27/20 1830  HGB 12.7*  HCT 38.6*  PLT 182  CREATININE 1.71*  TROPONINIHS 154*    Estimated Creatinine Clearance: 31.4 mL/min (A) (by C-G formula based on SCr of 1.71 mg/dL (H)).   Medical History: Past Medical History:  Diagnosis Date   Absolute anemia 05/28/2014   Aortic stenosis 09/17/2015   Arthritis    Ascites 11/18/2013   BELLS PALSY 07/19/2010   Qualifier: Diagnosis of  By: [name], [name]     CAD (coronary artery disease)    a. s/p CABG in 2003 b. s/p PCI to SVG-PDA in 07/2015 c. 11/2016: cath showing severe native CAD with patent LIMA-LAD and SVG-D1 with 80% stenosis of SVG-OM1-OM2. Initially medical management was recommended --> presented with recurrent angina --> s/p Synergy DES to proximal body of SVG-OM1-OM2, POBA to distal graft.    CAD- S/P PCI SVG-OM1 12/01/16 05/12/2006   Qualifier: Diagnosis of  By: [name], [name]     Carotid bruit 07/10/2015   Chest pain 11/22/2016   Chronic kidney disease    Chronic renal insufficiency, stage III (moderate) ([place]) 12/02/2016   Constipation 05/28/2014   COPD 07/20/2008   Qualifier: Diagnosis of  By: [name], [name]    DEPRESSION 09/03/2006   Qualifier: Diagnosis of  By: [name], [name]     Dermatitis 11/27/2012   Diabetes mellitus    Diarrhea 11/20/2013   DM (diabetes mellitus), type 2 with ophthalmic complications ([place]) 6/75/9163   Qualifier: Diagnosis of  By: [name], [name]     Dry eye syndrome 12/20/2010   Epicondylitis 06/05/2013   right   Erectile dysfunction 01/01/2012   Essential hypertension 05/12/2006   Qualifier:  Diagnosis of  By: [name], [name]     GENITAL HERPES 05/03/2009   Qualifier: Diagnosis of  By: [name], [name]     GERD 09/03/2006   Qualifier: Diagnosis of  By: [name], [name]     GERD (gastroesophageal reflux disease)    HEARING LOSS, SENSORINEURAL 05/12/2006   Qualifier: Diagnosis of  By: [name], [name]     HEMATOCHEZIA 04/18/2008   Annotation: 9/09 during bout of constipation Qualifier: Diagnosis of  By: [name], [name]     HIP PAIN, BILATERAL 07/17/2008   Qualifier: Diagnosis of  By: [name], [name]    History of depression    History of kidney stones    HIV (human immunodeficiency virus infection) ([place]) 1991   on meds since initial dx.    HLD (hyperlipidemia) 11/19/2013   Human immunodeficiency virus (HIV) disease ([place]) 05/12/2006   Qualifier: Diagnosis of  By: [name], [name]     Hx of CABG 09/03/2006   Annotation: 2003 Qualifier: Diagnosis of  By: [name], [name]     Hyperkalemia 03/23/2014   Hyperlipidemia    Hypertension    Hyponatremia 03/23/2014   INGUINAL LYMPHADENOPATHY, RIGHT 04/03/2009   Qualifier: Diagnosis of  By: [name], [name] 01/24/2015   KNEE PAIN, BILATERAL 07/17/2008   Qualifier: Diagnosis of  By: [name], [name]    Lesion of breast 07/21/2015   Lipodystrophy  12/20/2010   Memory loss 09/18/2008   Qualifier: Diagnosis of  By: [name], [name]    Metatarsal deformity 01/24/2015   Nasal abscess 12/04/2015   Night sweats 07/06/2012   Nocturia 03/06/2013   NSTEMI (non-ST elevated myocardial infarction) ([place]) 11/29/2016   Pain in joint, ankle and foot 01/19/2015   Pancreatitis 11/2013   attributed to HIV meds.    Pedal edema 05/21/2014   PERIPHERAL VASCULAR DISEASE 07/20/2008   Qualifier: Diagnosis of  By: [name], [name]    Posterior cervical lymphadenopathy 03/30/2014   Protein-calorie malnutrition, severe ([place]) 03/23/2014   SHINGLES, HX OF 05/03/2009   Annotation: R leg Qualifier: Diagnosis of  By:  [name], [name]     STEMI (ST elevation myocardial infarction) ([place]) 07/29/2015   Unstable angina ([place]) 11/24/2016   WEIGHT LOSS, ABNORMAL 04/03/2009   Qualifier: Diagnosis of  By: [name], [name]      Medications:  Scheduled:   heparin  3,500 Units Intravenous Once    Assessment: Patient is a 66 yom that presents to the ED with c/o CP. Patient was found to have an elevated trop. At this time pharmacy has been asked to dose heparin for ACS.   Goal of Therapy:  Heparin level 0.3-0.7 units/ml Monitor platelets by anticoagulation protocol: Yes   Plan:  - Heparin bolus 3500 units IV x 1 dose - Heparin drip @ 750 units/hr - Heparin level in ~ 6 hours  - Monitor patient for s/s of bleeding and CBC while on heparin   Duanne Limerick PharmD. BCPS  05/27/2020,8:10 PM

## 2020-05-27 NOTE — ED Notes (Signed)
CRITICAL VALUE ALERT  Critical Value:  Trop 154  Date & Time Notied:  05-27-20  Provider Notified: Dr. Ralene Bathe  Orders Received/Actions taken: yes

## 2020-05-27 NOTE — ED Provider Notes (Signed)
[facility] EMERGENCY DEPARTMENT Provider Note   CSN: [id number] Arrival date & time: 05/27/20  1807     History Chief Complaint  Patient presents with  . Chest Pain    [name] is a 74 y.o. male.  The history is provided by the patient and medical records.  Chest Pain  [name] is a 74 y.o. male who presents to the Emergency Department complaining of chest pain. He presents the emergency department complaining of central chest pain described as a pressure type sensation that started while he was driving. Symptoms started 30 minutes prior to ED arrival. He had partial improvement in his symptoms after two nitroglycerins. He experienced associated diaphoresis, shortness of breath. Symptoms are very similar to his prior MI in 2016. Denies associated abdominal pain, nausea, vomiting, leg swelling or pain. He sees Dr. Stanford Breed with cardiology. He has been fully vaccinated for COVID-19.    Past Medical History:  Diagnosis Date  . Absolute anemia 05/28/2014  . Aortic stenosis 09/17/2015  . Arthritis   . Ascites 11/18/2013  . BELLS PALSY 07/19/2010   Qualifier: Diagnosis of  By: [name], [name]    . CAD (coronary artery disease)    a. s/p CABG in 2003 b. s/p PCI to SVG-PDA in 07/2015 c. 11/2016: cath showing severe native CAD with patent LIMA-LAD and SVG-D1 with 80% stenosis of SVG-OM1-OM2. Initially medical management was recommended --> presented with recurrent angina --> s/p Synergy DES to proximal body of SVG-OM1-OM2, POBA to distal graft.   Marland Kitchen CAD- S/P PCI SVG-OM1 12/01/16 05/12/2006   Qualifier: Diagnosis of  By: [name], [name]    . Carotid bruit 07/10/2015  . Chest pain 11/22/2016  . Chronic kidney disease   . Chronic renal insufficiency, stage III (moderate) ([place]) 12/02/2016  . Constipation 05/28/2014  . COPD 07/20/2008   Qualifier: Diagnosis of  By: [name], [name]   . DEPRESSION 09/03/2006   Qualifier: Diagnosis of  By: [name], [name]    .  Dermatitis 11/27/2012  . Diabetes mellitus   . Diarrhea 11/20/2013  . DM (diabetes mellitus), type 2 with ophthalmic complications ([place]) 2/45/8099   Qualifier: Diagnosis of  By: [name], [name]    . Dry eye syndrome 12/20/2010  . Epicondylitis 06/05/2013   right  . Erectile dysfunction 01/01/2012  . Essential hypertension 05/12/2006   Qualifier: Diagnosis of  By: [name], [name]    . GENITAL HERPES 05/03/2009   Qualifier: Diagnosis of  By: [name], [name]    . GERD 09/03/2006   Qualifier: Diagnosis of  By: [name], [name]    . GERD (gastroesophageal reflux disease)   . HEARING LOSS, SENSORINEURAL 05/12/2006   Qualifier: Diagnosis of  By: [name], [name]    . HEMATOCHEZIA 04/18/2008   Annotation: 9/09 during bout of constipation Qualifier: Diagnosis of  By: [name], [name]    . HIP PAIN, BILATERAL 07/17/2008   Qualifier: Diagnosis of  By: [name], [name]   . History of depression   . History of kidney stones   . HIV (human immunodeficiency virus infection) ([place]) 1991   on meds since initial dx.   Marland Kitchen HLD (hyperlipidemia) 11/19/2013  . Human immunodeficiency virus (HIV) disease ([place]) 05/12/2006   Qualifier: Diagnosis of  By: [name], [name]    . Hx of CABG 09/03/2006   Annotation: 2003 Qualifier: Diagnosis of  By: [name], [name]    . Hyperkalemia 03/23/2014  . Hyperlipidemia   .  Hypertension   . Hyponatremia 03/23/2014  . INGUINAL LYMPHADENOPATHY, RIGHT 04/03/2009   Qualifier: Diagnosis of  By: [name], [name]    . Keratoma 01/24/2015  . KNEE PAIN, BILATERAL 07/17/2008   Qualifier: Diagnosis of  By: [name], [name]   . Lesion of breast 07/21/2015  . Lipodystrophy 12/20/2010  . Memory loss 09/18/2008   Qualifier: Diagnosis of  By: [name], [name]   . Metatarsal deformity 01/24/2015  . Nasal abscess 12/04/2015  . Night sweats 07/06/2012  . Nocturia 03/06/2013  . NSTEMI (non-ST elevated myocardial infarction) ([place]) 11/29/2016  . Pain in joint, ankle and foot 01/19/2015  .  Pancreatitis 11/2013   attributed to HIV meds.   . Pedal edema 05/21/2014  . PERIPHERAL VASCULAR DISEASE 07/20/2008   Qualifier: Diagnosis of  By: [name], [name]   . Posterior cervical lymphadenopathy 03/30/2014  . Protein-calorie malnutrition, severe ([place]) 03/23/2014  . SHINGLES, HX OF 05/03/2009   Annotation: R leg Qualifier: Diagnosis of  By: [name], [name]    . STEMI (ST elevation myocardial infarction) ([place]) 07/29/2015  . Unstable angina ([place]) 11/24/2016  . WEIGHT LOSS, ABNORMAL 04/03/2009   Qualifier: Diagnosis of  By: [name], [name]      Patient Active Problem List   Diagnosis Date Noted  . Impingement syndrome of left shoulder region 07/08/2018  . Stiffness of left shoulder joint 07/08/2018  . Pain in joint of left shoulder 07/08/2018  . Chronic renal insufficiency, stage III (moderate) ([place]) 12/02/2016  . NSTEMI (non-ST elevated myocardial infarction) ([place]) 11/29/2016  . Neck mass 06/16/2016  . Aortic stenosis 09/17/2015  . STEMI (ST elevation myocardial infarction) ([place]) 07/29/2015  . Lesion of breast 07/21/2015  . Carotid bruit 07/10/2015  . Metatarsal deformity 01/24/2015  . Keratoma 01/24/2015  . Pain in joint, ankle and foot 01/19/2015  . Absolute anemia 05/28/2014  . Pedal edema 05/21/2014  . Posterior cervical lymphadenopathy 03/30/2014  . Protein-calorie malnutrition, severe ([place]) 03/23/2014  . Diarrhea 11/20/2013  . HLD (hyperlipidemia) 11/19/2013  . Ascites 11/18/2013  . Pancreatitis 11/15/2013  . Epicondylitis 06/05/2013  . Nocturia 03/06/2013  . Dermatitis 11/27/2012  . Erectile dysfunction 01/01/2012  . Lipodystrophy 12/20/2010  . Dry eye syndrome 12/20/2010  . BELLS PALSY 07/19/2010  . GENITAL HERPES 05/03/2009  . SHINGLES, HX OF 05/03/2009  . WEIGHT LOSS, ABNORMAL 04/03/2009  . INGUINAL LYMPHADENOPATHY, RIGHT 04/03/2009  . MEMORY LOSS 09/18/2008  . PERIPHERAL VASCULAR DISEASE 07/20/2008  . Chronic obstructive pulmonary disease ([place])  07/20/2008  . HIP PAIN, BILATERAL 07/17/2008  . KNEE PAIN, BILATERAL 07/17/2008  . NEPHROLITHIASIS, HX OF 02/22/2008  . DM (diabetes mellitus), type 2 with ophthalmic complications ([place]) 15/17/6160  . Depression, recurrent ([place]) 09/03/2006  . GERD 09/03/2006  . Hx of CABG 09/03/2006  . Type 2 diabetes mellitus ([place]) 09/03/2006  . Human immunodeficiency virus (HIV) disease ([place]) 05/12/2006  . HEARING LOSS, SENSORINEURAL 05/12/2006  . Essential hypertension 05/12/2006  . CAD- S/P PCI SVG-OM1 12/01/16 05/12/2006  . Atherosclerosis of coronary artery 05/12/2006    Past Surgical History:  Procedure Laterality Date  . CARDIAC CATHETERIZATION N/A 07/29/2015   Procedure: Left Heart Cath and Coronary Angiography;  Surgeon: [name], MD;  Location: [facility];  Service: Cardiovascular;  Laterality: N/A;  . CARDIAC CATHETERIZATION N/A 07/29/2015   Procedure: Coronary Stent Intervention;  Surgeon: [name], MD;  Location: [facility];  Service: Cardiovascular;  Laterality: N/A;  . COLONOSCOPY  12/02/2019  .  CORONARY ARTERY BYPASS GRAFT  05/2002  . CORONARY STENT INTERVENTION N/A 12/01/2016   Procedure: Coronary Stent Intervention;  Surgeon: [name], MD;  Location: [facility];  Service: Cardiovascular;  Laterality: N/A;  . CORONARY STENT INTERVENTION N/A 10/21/2017   Procedure: CORONARY STENT INTERVENTION;  Surgeon: Jettie Booze, MD;  Location: [facility];  Service: Cardiovascular;  Laterality: N/A;  . LEFT HEART CATH AND CORS/GRAFTS ANGIOGRAPHY N/A 11/24/2016   Procedure: Left Heart Cath and Cors/Grafts Angiography;  Surgeon: Nelva Bush, MD;  Location: [facility];  Service: Cardiovascular;  Laterality: N/A;  . LEFT HEART CATH AND CORS/GRAFTS ANGIOGRAPHY N/A 12/01/2016   Procedure: Left Heart Cath and Cors/Grafts Angiography;  Surgeon: [name], MD;  Location: [facility];  Service: Cardiovascular;  Laterality: N/A;    . LEFT HEART CATH AND CORS/GRAFTS ANGIOGRAPHY N/A 10/21/2017   Procedure: LEFT HEART CATH AND CORS/GRAFTS ANGIOGRAPHY;  Surgeon: Jettie Booze, MD;  Location: [facility];  Service: Cardiovascular;  Laterality: N/A;  . VASECTOMY         Family History  Problem Relation Age of Onset  . Hyperlipidemia Mother   . Diabetes Mother        paternal grandparents/1 brother  . Hyperlipidemia Father   . Hypertension Father        paternal grandmother/3 brothers/1 sister  . Arthritis Other        mother/father/paternal grandparents  . Breast cancer Maternal Aunt        paternal aunt  . Lung cancer Maternal Aunt   . Heart disease Other        parents/maternal grandparents/ 2 brothers  . Stroke Paternal Grandmother   . Mental retardation Sister   . Colon cancer Neg Hx   . Esophageal cancer Neg Hx   . Pancreatic cancer Neg Hx   . Stomach cancer Neg Hx   . Liver disease Neg Hx   . Colon polyps Neg Hx   . Rectal cancer Neg Hx     Social History   Tobacco Use  . Smoking status: Never Smoker  . Smokeless tobacco: Never Used  Vaping Use  . Vaping Use: Never used  Substance Use Topics  . Alcohol use: Not Currently    Alcohol/week: 0.0 standard drinks    Comment: rare  . Drug use: No    Home Medications Prior to Admission medications   Medication Sig Start Date End Date Taking? Authorizing Provider  acetaminophen (TYLENOL) 325 MG tablet Take 2 tablets (650 mg total) by mouth every 4 (four) hours as needed for headache or mild pain. 12/02/16   Erlene Quan, PA-C  aspirin 81 MG chewable tablet Chew 81 mg by mouth daily.    [provider]  atorvastatin (LIPITOR) 20 MG tablet TAKE 1 TABLET(20 MG) BY MOUTH DAILY 02/13/20   Michel Bickers, MD  b complex vitamins tablet Take 1 tablet by mouth daily. 09/25/16   Mosie Lukes, MD  Cholecalciferol 2000 units CAPS Take 1 capsule (2,000 Units total) by mouth daily. 09/25/16   Mosie Lukes, MD  dolutegravir (TIVICAY) 50  MG tablet TAKE 1 TABLET BY MOUTH DAILY with Pifeltro 05/18/20   Michel Bickers, MD  doravirine (PIFELTRO) 100 MG TABS tablet TAKE 1 TABLET(100 MG) BY MOUTH DAILY with Tivicay 05/18/20   Michel Bickers, MD  fenofibrate 54 MG tablet TAKE 1 TABLET(54 MG) BY MOUTH DAILY 09/28/19   Almyra Deforest, PA  furosemide (LASIX) 20  MG tablet Take 2 tablets (40 mg total) by mouth daily. 01/10/20   Nafziger, Tommi Rumps, NP  glucose blood (FREESTYLE TEST STRIPS) test strip USE TO CHECK BLOOD SUGAR THREE TIMES DAILY 11/18/19   Elayne Snare, MD  isosorbide mononitrate (IMDUR) 30 MG 24 hr tablet Take 1 tablet (30 mg total) by mouth daily. 01/16/20   Lelon Perla, MD  Lancets (FREESTYLE) lancets Use as directed three times a day to check blood sugar.  DX E11.9 11/03/17   Elayne Snare, MD  lisinopril (ZESTRIL) 5 MG tablet TAKE 1 TABLET(5 MG) BY MOUTH DAILY 11/11/19   Barrett, Evelene Croon, PA-C  metoprolol tartrate (LOPRESSOR) 25 MG tablet Take 0.5 tablets (12.5 mg total) by mouth 2 (two) times daily. NEED APPOINTMENT FOR FUTURE REFILL 02/13/20   Lelon Perla, MD  nitroGLYCERIN (NITROSTAT) 0.4 MG SL tablet PLACE 1 TABLET UNDER THE TONGUE EVERY 5 MINUTES AS NEEDED FOR CHEST PAIN 03/19/20   Lelon Perla, MD  pantoprazole (PROTONIX) 40 MG tablet TAKE 1 TABLET(40 MG) BY MOUTH DAILY 10/12/19   Lelon Perla, MD  pioglitazone (ACTOS) 15 MG tablet TAKE 1 TABLET(15 MG) BY MOUTH DAILY 05/13/19   Elayne Snare, MD  potassium chloride SA (KLOR-CON) 20 MEQ tablet TAKE 1 TABLET(20 MEQ) BY MOUTH THREE TIMES DAILY 11/29/19   Nafziger, Tommi Rumps, NP  PREVIDENT 5000 PLUS 1.1 % CREA dental cream Take by mouth. 03/02/20   [provider]  Probiotic Product (PROBIOTIC DAILY) CAPS Take 1 by mouth daily 09/25/16   Mosie Lukes, MD  repaglinide (PRANDIN) 2 MG tablet Take 2 mg by mouth 3 (three) times daily before meals. Take 2 tablet by mouth daily before breakfast and 1 tablet by mouth daily before lunch and dinner.    [provider]  sodium  bicarbonate 650 MG tablet Take 650 mg by mouth 3 (three) times daily.    [provider]  tamsulosin (FLOMAX) 0.4 MG CAPS capsule TK 1 C PO BID 05/19/18   [provider]  triamcinolone cream (KENALOG) 0.1 % apply twice daily as needed to dry, itchy skin 10/15/15   [provider]  valACYclovir (VALTREX) 500 MG tablet TAKE 1 TABLET BY MOUTH DAILY 03/27/20   Michel Bickers, MD  VASCEPA 1 g capsule TAKE 2 CAPSULES(2 GRAMS) BY MOUTH TWICE DAILY 03/16/20   Elayne Snare, MD    Allergies    Patient has no known allergies.  Review of Systems   Review of Systems  Cardiovascular: Positive for chest pain.  All other systems reviewed and are negative.   Physical Exam Updated Vital Signs BP (!) 180/79   Pulse (!) 59   Temp 98.5 F (36.9 C)   Resp 20   Ht 5\' 3"  (1.6 m)   Wt 60.8 kg   SpO2 98%   BMI 23.74 kg/m   Physical Exam Vitals and nursing note reviewed.  Constitutional:      Appearance: He is well-developed.  HENT:     Head: Normocephalic and atraumatic.  Cardiovascular:     Rate and Rhythm: Normal rate and regular rhythm.     Heart sounds: No murmur heard.   Pulmonary:     Effort: Pulmonary effort is normal. No respiratory distress.     Breath sounds: Normal breath sounds.  Abdominal:     Palpations: Abdomen is soft.     Tenderness: There is no abdominal tenderness. There is no guarding or rebound.  Musculoskeletal:        General: No  swelling or tenderness.  Skin:    General: Skin is warm and dry.  Neurological:     Mental Status: He is alert and oriented to person, place, and time.  Psychiatric:        Behavior: Behavior normal.     ED Results / Procedures / Treatments   Labs (all labs ordered are listed, but only abnormal results are displayed) Labs Reviewed  BASIC METABOLIC PANEL - Abnormal; Notable for the following components:      Result Value   Glucose, Bld 212 (*)    Creatinine, Ser 1.71 (*)    Calcium 8.8 (*)    GFR, Estimated  42 (*)    All other components within normal limits  CBC - Abnormal; Notable for the following components:   RBC 3.96 (*)    Hemoglobin 12.7 (*)    HCT 38.6 (*)    All other components within normal limits  TROPONIN I (HIGH SENSITIVITY) - Abnormal; Notable for the following components:   Troponin I (High Sensitivity) 154 (*)    All other components within normal limits  RESPIRATORY PANEL BY RT PCR (FLU A&B, COVID)    EKG EKG Interpretation  Date/Time:  Sunday May 27 2020 18:12:48 EDT Ventricular Rate:  59 PR Interval:  208 QRS Duration: 94 QT Interval:  436 QTC Calculation: 431 R Axis:   32 Text Interpretation: Sinus bradycardia Left ventricular hypertrophy with repolarization abnormality ( Sokolow-Lyon ) Possible Inferior infarct , age undetermined Abnormal ECG Confirmed by Quintella Reichert [phone]) on 05/27/2020 6:48:53 PM   Radiology DG Chest 2 View  Result Date: 05/27/2020 CLINICAL DATA:  Chest pain. EXAM: CHEST - 2 VIEW COMPARISON:  Multiple priors, most recent October 20, 2017 FINDINGS: Similar cardiomediastinal silhouette. Postsurgical changes of CABG and median sternotomy. Linear and hazy left basilar opacities, similar over multiple priors and likely related to atelectasis or scar. No new focal consolidation. No pleural effusions or pneumothorax. No acute osseous abnormality. IMPRESSION: Left basilar atelectasis/scar without superimposed acute cardiopulmonary disease. Electronically Signed   By: Margaretha Sheffield MD   On: 05/27/2020 19:13    Procedures Procedures (including critical care time)  Medications Ordered in ED Medications  aspirin chewable tablet 324 mg (has no administration in time range)  nitroGLYCERIN (NITROSTAT) SL tablet 0.4 mg (has no administration in time range)    ED Course  I have reviewed the triage vital signs and the nursing notes.  Pertinent labs & imaging results that were available during my care of the patient were reviewed by me and  considered in my medical decision making (see chart for details).    MDM Rules/Calculators/A&P                         patient with history of coronary artery disease here for evaluation of central chest pain, similar to prior MI. EKG with LVH pattern, similar when compared to priors. He has mild renal insufficiency, similar when compared to priors. Troponin is elevated at 154. Concern for ACS in the setting of his symptoms. Will start on heparin. Cardiology consulted for further treatment. Dr. Radford Pax with cardiology recommend admission to medicine service. Hospitalist consulted for admission for further treatment. Presentation is not consistent with PE, dissection. Patient updated findings of studies recommendation for mission and he is in agreement treatment plan. Final Clinical Impression(s) / ED Diagnoses Final diagnoses:  NSTEMI (non-ST elevated myocardial infarction) ([place])    Rx / DC Orders ED Discharge Orders  None       Quintella Reichert, MD 05/27/20 2119

## 2020-05-27 NOTE — ED Notes (Signed)
Attempted to call report to unit #. No answer. Called Charge and she gave RN's # who would receive pt. No answer. Will try again shortly.

## 2020-05-28 ENCOUNTER — Encounter (HOSPITAL_COMMUNITY): Payer: Self-pay | Admitting: Internal Medicine

## 2020-05-28 ENCOUNTER — Inpatient Hospital Stay (HOSPITAL_COMMUNITY)
Admission: EM | Disposition: A | Discharge status: Home / Self Care | Payer: Self-pay | Source: Home / Self Care | Attending: Internal Medicine

## 2020-05-28 ENCOUNTER — Observation Stay (HOSPITAL_BASED_OUTPATIENT_CLINIC_OR_DEPARTMENT_OTHER): Payer: Medicare Other

## 2020-05-28 DIAGNOSIS — I252 Old myocardial infarction: Secondary | ICD-10-CM | POA: Diagnosis not present

## 2020-05-28 DIAGNOSIS — Z7902 Long term (current) use of antithrombotics/antiplatelets: Secondary | ICD-10-CM | POA: Diagnosis not present

## 2020-05-28 DIAGNOSIS — Z79899 Other long term (current) drug therapy: Secondary | ICD-10-CM | POA: Diagnosis not present

## 2020-05-28 DIAGNOSIS — I351 Nonrheumatic aortic (valve) insufficiency: Secondary | ICD-10-CM | POA: Diagnosis not present

## 2020-05-28 DIAGNOSIS — N400 Enlarged prostate without lower urinary tract symptoms: Secondary | ICD-10-CM | POA: Diagnosis present

## 2020-05-28 DIAGNOSIS — I2510 Atherosclerotic heart disease of native coronary artery without angina pectoris: Secondary | ICD-10-CM | POA: Diagnosis present

## 2020-05-28 DIAGNOSIS — R079 Chest pain, unspecified: Secondary | ICD-10-CM

## 2020-05-28 DIAGNOSIS — E782 Mixed hyperlipidemia: Secondary | ICD-10-CM | POA: Diagnosis not present

## 2020-05-28 DIAGNOSIS — I5032 Chronic diastolic (congestive) heart failure: Secondary | ICD-10-CM | POA: Diagnosis not present

## 2020-05-28 DIAGNOSIS — N1832 Chronic kidney disease, stage 3b: Secondary | ICD-10-CM | POA: Diagnosis present

## 2020-05-28 DIAGNOSIS — Z20822 Contact with and (suspected) exposure to covid-19: Secondary | ICD-10-CM | POA: Diagnosis not present

## 2020-05-28 DIAGNOSIS — I130 Hypertensive heart and chronic kidney disease with heart failure and stage 1 through stage 4 chronic kidney disease, or unspecified chronic kidney disease: Secondary | ICD-10-CM | POA: Diagnosis not present

## 2020-05-28 DIAGNOSIS — Z83438 Family history of other disorder of lipoprotein metabolism and other lipidemia: Secondary | ICD-10-CM | POA: Diagnosis not present

## 2020-05-28 DIAGNOSIS — Z7982 Long term (current) use of aspirin: Secondary | ICD-10-CM | POA: Diagnosis not present

## 2020-05-28 DIAGNOSIS — I25810 Atherosclerosis of coronary artery bypass graft(s) without angina pectoris: Secondary | ICD-10-CM

## 2020-05-28 DIAGNOSIS — B20 Human immunodeficiency virus [HIV] disease: Secondary | ICD-10-CM | POA: Diagnosis not present

## 2020-05-28 DIAGNOSIS — K219 Gastro-esophageal reflux disease without esophagitis: Secondary | ICD-10-CM | POA: Diagnosis not present

## 2020-05-28 DIAGNOSIS — E1151 Type 2 diabetes mellitus with diabetic peripheral angiopathy without gangrene: Secondary | ICD-10-CM | POA: Diagnosis present

## 2020-05-28 DIAGNOSIS — Z794 Long term (current) use of insulin: Secondary | ICD-10-CM | POA: Diagnosis not present

## 2020-05-28 DIAGNOSIS — I25110 Atherosclerotic heart disease of native coronary artery with unstable angina pectoris: Secondary | ICD-10-CM | POA: Diagnosis not present

## 2020-05-28 DIAGNOSIS — Y831 Surgical operation with implant of artificial internal device as the cause of abnormal reaction of the patient, or of later complication, without mention of misadventure at the time of the procedure: Secondary | ICD-10-CM | POA: Diagnosis present

## 2020-05-28 DIAGNOSIS — E1122 Type 2 diabetes mellitus with diabetic chronic kidney disease: Secondary | ICD-10-CM | POA: Diagnosis not present

## 2020-05-28 DIAGNOSIS — Z8249 Family history of ischemic heart disease and other diseases of the circulatory system: Secondary | ICD-10-CM | POA: Diagnosis not present

## 2020-05-28 DIAGNOSIS — I214 Non-ST elevation (NSTEMI) myocardial infarction: Secondary | ICD-10-CM | POA: Diagnosis not present

## 2020-05-28 DIAGNOSIS — I10 Essential (primary) hypertension: Secondary | ICD-10-CM | POA: Diagnosis not present

## 2020-05-28 DIAGNOSIS — J449 Chronic obstructive pulmonary disease, unspecified: Secondary | ICD-10-CM | POA: Diagnosis not present

## 2020-05-28 DIAGNOSIS — Z833 Family history of diabetes mellitus: Secondary | ICD-10-CM | POA: Diagnosis not present

## 2020-05-28 DIAGNOSIS — I2582 Chronic total occlusion of coronary artery: Secondary | ICD-10-CM | POA: Diagnosis present

## 2020-05-28 DIAGNOSIS — T82855A Stenosis of coronary artery stent, initial encounter: Secondary | ICD-10-CM | POA: Diagnosis not present

## 2020-05-28 HISTORY — PX: CORONARY BALLOON ANGIOPLASTY: CATH118233

## 2020-05-28 HISTORY — PX: LEFT HEART CATH AND CORS/GRAFTS ANGIOGRAPHY: CATH118250

## 2020-05-28 LAB — COMPREHENSIVE METABOLIC PANEL
ALT: 31 U/L (ref 0–44)
AST: 30 U/L (ref 15–41)
Albumin: 3.2 g/dL — ABNORMAL LOW (ref 3.5–5.0)
Alkaline Phosphatase: 45 U/L (ref 38–126)
Anion gap: 9 (ref 5–15)
BUN: 21 mg/dL (ref 8–23)
CO2: 22 mmol/L (ref 22–32)
Calcium: 8.8 mg/dL — ABNORMAL LOW (ref 8.9–10.3)
Chloride: 108 mmol/L (ref 98–111)
Creatinine, Ser: 1.56 mg/dL — ABNORMAL HIGH (ref 0.61–1.24)
GFR, Estimated: 47 mL/min — ABNORMAL LOW (ref >60)
Glucose, Bld: 236 mg/dL — ABNORMAL HIGH (ref 70–99)
Potassium: 3.8 mmol/L (ref 3.5–5.1)
Sodium: 139 mmol/L (ref 135–145)
Total Bilirubin: 0.7 mg/dL (ref 0.3–1.2)
Total Protein: 6.1 g/dL — ABNORMAL LOW (ref 6.5–8.1)

## 2020-05-28 LAB — CBC WITH DIFFERENTIAL/PLATELET
Abs Immature Granulocytes: 0.02 10*3/uL (ref 0.00–0.07)
Basophils Absolute: 0 10*3/uL (ref 0.0–0.1)
Basophils Relative: 0 %
Eosinophils Absolute: 0.3 10*3/uL (ref 0.0–0.5)
Eosinophils Relative: 5 %
HCT: 36.7 % — ABNORMAL LOW (ref 39.0–52.0)
Hemoglobin: 12.4 g/dL — ABNORMAL LOW (ref 13.0–17.0)
Immature Granulocytes: 0 %
Lymphocytes Relative: 11 %
Lymphs Abs: 0.7 10*3/uL (ref 0.7–4.0)
MCH: 32.1 pg (ref 26.0–34.0)
MCHC: 33.8 g/dL (ref 30.0–36.0)
MCV: 95.1 fL (ref 80.0–100.0)
Monocytes Absolute: 0.5 10*3/uL (ref 0.1–1.0)
Monocytes Relative: 8 %
Neutro Abs: 5.1 10*3/uL (ref 1.7–7.7)
Neutrophils Relative %: 76 %
Platelets: 169 10*3/uL (ref 150–400)
RBC: 3.86 MIL/uL — ABNORMAL LOW (ref 4.22–5.81)
RDW: 13 % (ref 11.5–15.5)
WBC: 6.7 10*3/uL (ref 4.0–10.5)
nRBC: 0 % (ref 0.0–0.2)

## 2020-05-28 LAB — ECHOCARDIOGRAM COMPLETE
AR max vel: 1.03 cm2
AV Area VTI: 1.05 cm2
AV Area mean vel: 1 cm2
AV Mean grad: 10 mmHg
AV Peak grad: 17.5 mmHg
Ao pk vel: 2.09 m/s
Area-P 1/2: 3.85 cm2
Height: 63 in
P 1/2 time: 710 msec
S' Lateral: 3.1 cm
Weight: 2190.49 oz

## 2020-05-28 LAB — HEMOGLOBIN A1C
Hgb A1c MFr Bld: 7.2 % — ABNORMAL HIGH (ref 4.8–5.6)
Mean Plasma Glucose: 160 mg/dL

## 2020-05-28 LAB — CBC
HCT: 40.3 % (ref 39.0–52.0)
Hemoglobin: 13.5 g/dL (ref 13.0–17.0)
MCH: 31.8 pg (ref 26.0–34.0)
MCHC: 33.5 g/dL (ref 30.0–36.0)
MCV: 95 fL (ref 80.0–100.0)
Platelets: 146 10*3/uL — ABNORMAL LOW (ref 150–400)
RBC: 4.24 MIL/uL (ref 4.22–5.81)
RDW: 13 % (ref 11.5–15.5)
WBC: 5.3 10*3/uL (ref 4.0–10.5)
nRBC: 0 % (ref 0.0–0.2)

## 2020-05-28 LAB — GLUCOSE, CAPILLARY
Glucose-Capillary: 158 mg/dL — ABNORMAL HIGH (ref 70–99)
Glucose-Capillary: 126 mg/dL — ABNORMAL HIGH (ref 70–99)
Glucose-Capillary: 67 mg/dL — ABNORMAL LOW (ref 70–99)
Glucose-Capillary: 84 mg/dL (ref 70–99)
Glucose-Capillary: 88 mg/dL (ref 70–99)
Glucose-Capillary: 130 mg/dL — ABNORMAL HIGH (ref 70–99)

## 2020-05-28 LAB — CREATININE, SERUM
Creatinine, Ser: 1.51 mg/dL — ABNORMAL HIGH (ref 0.61–1.24)
GFR, Estimated: 49 mL/min — ABNORMAL LOW (ref >60)

## 2020-05-28 LAB — POCT ACTIVATED CLOTTING TIME: Activated Clotting Time: 351 seconds

## 2020-05-28 LAB — MAGNESIUM: Magnesium: 2 mg/dL (ref 1.7–2.4)

## 2020-05-28 LAB — HEPARIN LEVEL (UNFRACTIONATED): Heparin Unfractionated: 0.24 IU/mL — ABNORMAL LOW (ref 0.30–0.70)

## 2020-05-28 LAB — MRSA PCR SCREENING: MRSA by PCR: NEGATIVE

## 2020-05-28 LAB — TROPONIN I (HIGH SENSITIVITY): Troponin I (High Sensitivity): 1001 ng/L (ref <18)

## 2020-05-28 SURGERY — LEFT HEART CATH AND CORS/GRAFTS ANGIOGRAPHY
Anesthesia: LOCAL

## 2020-05-28 MED ORDER — TICAGRELOR 90 MG PO TABS
ORAL_TABLET | ORAL | Status: AC
  Filled 2020-05-28: qty 2

## 2020-05-28 MED ORDER — HEPARIN (PORCINE) IN NACL 1000-0.9 UT/500ML-% IV SOLN
INTRAVENOUS | Status: AC
  Filled 2020-05-28: qty 1000

## 2020-05-28 MED ORDER — LIDOCAINE HCL (PF) 1 % IJ SOLN
INTRAMUSCULAR | Status: DC | PRN
  Administered 2020-05-28: 15 mL

## 2020-05-28 MED ORDER — HEPARIN (PORCINE) IN NACL 1000-0.9 UT/500ML-% IV SOLN
INTRAVENOUS | Status: DC | PRN
  Administered 2020-05-28 (×2): 500 mL

## 2020-05-28 MED ORDER — SODIUM CHLORIDE 0.9 % IV SOLN
INTRAVENOUS | Status: DC | PRN
  Administered 2020-05-28: 1.75 mg/kg/h via INTRAVENOUS

## 2020-05-28 MED ORDER — MIDAZOLAM HCL 2 MG/2ML IJ SOLN
INTRAMUSCULAR | Status: DC | PRN
  Administered 2020-05-28: 1 mg via INTRAVENOUS

## 2020-05-28 MED ORDER — NITROGLYCERIN 1 MG/10 ML FOR IR/CATH LAB
INTRA_ARTERIAL | Status: AC
  Filled 2020-05-28: qty 10

## 2020-05-28 MED ORDER — LIDOCAINE HCL (PF) 1 % IJ SOLN
INTRAMUSCULAR | Status: AC
  Filled 2020-05-28: qty 30

## 2020-05-28 MED ORDER — TICAGRELOR 90 MG PO TABS
90.0000 mg | ORAL_TABLET | Freq: Two times a day (BID) | ORAL | Status: DC
  Administered 2020-05-29: 90 mg via ORAL
  Filled 2020-05-28: qty 1

## 2020-05-28 MED ORDER — SODIUM CHLORIDE 0.9% FLUSH
3.0000 mL | Freq: Two times a day (BID) | INTRAVENOUS | Status: DC
  Administered 2020-05-28 – 2020-05-29 (×3): 3 mL via INTRAVENOUS

## 2020-05-28 MED ORDER — SODIUM CHLORIDE 0.9% FLUSH: 3.0000 mL | INTRAVENOUS | Status: DC | PRN

## 2020-05-28 MED ORDER — SODIUM CHLORIDE 0.9 % IV SOLN: INTRAVENOUS | Status: AC

## 2020-05-28 MED ORDER — SODIUM CHLORIDE 0.9% FLUSH
3.0000 mL | Freq: Two times a day (BID) | INTRAVENOUS | Status: DC
  Administered 2020-05-28 – 2020-05-29 (×2): 3 mL via INTRAVENOUS

## 2020-05-28 MED ORDER — ASPIRIN EC 81 MG PO TBEC
81.0000 mg | DELAYED_RELEASE_TABLET | Freq: Every day | ORAL | Status: DC
  Administered 2020-05-29: 81 mg via ORAL
  Filled 2020-05-28: qty 1

## 2020-05-28 MED ORDER — ASPIRIN 81 MG PO CHEW: 81.0000 mg | CHEWABLE_TABLET | ORAL | Status: DC

## 2020-05-28 MED ORDER — LABETALOL HCL 5 MG/ML IV SOLN: 10.0000 mg | INTRAVENOUS | Status: AC | PRN

## 2020-05-28 MED ORDER — SODIUM CHLORIDE 0.9 % IV SOLN: 250.0000 mL | INTRAVENOUS | Status: DC | PRN

## 2020-05-28 MED ORDER — IOHEXOL 350 MG/ML SOLN
INTRAVENOUS | Status: DC | PRN
  Administered 2020-05-28: 65 mL

## 2020-05-28 MED ORDER — TICAGRELOR 90 MG PO TABS
ORAL_TABLET | ORAL | Status: DC | PRN
  Administered 2020-05-28: 180 mg via ORAL

## 2020-05-28 MED ORDER — SODIUM CHLORIDE 0.9 % IV SOLN: INTRAVENOUS | Status: DC

## 2020-05-28 MED ORDER — BIVALIRUDIN BOLUS VIA INFUSION - CUPID
INTRAVENOUS | Status: DC | PRN
  Administered 2020-05-28: 46.575 mg via INTRAVENOUS

## 2020-05-28 MED ORDER — MIDAZOLAM HCL 2 MG/2ML IJ SOLN
INTRAMUSCULAR | Status: AC
  Filled 2020-05-28: qty 2

## 2020-05-28 MED ORDER — BIVALIRUDIN TRIFLUOROACETATE 250 MG IV SOLR
INTRAVENOUS | Status: AC
  Filled 2020-05-28: qty 250

## 2020-05-28 MED ORDER — FENTANYL CITRATE (PF) 100 MCG/2ML IJ SOLN
INTRAMUSCULAR | Status: AC
  Filled 2020-05-28: qty 2

## 2020-05-28 MED ORDER — HYDRALAZINE HCL 20 MG/ML IJ SOLN: 10.0000 mg | INTRAMUSCULAR | Status: AC | PRN

## 2020-05-28 MED ORDER — PERFLUTREN LIPID MICROSPHERE
1.0000 mL | INTRAVENOUS | Status: AC | PRN
  Administered 2020-05-28: 2 mL via INTRAVENOUS
  Filled 2020-05-28: qty 10

## 2020-05-28 MED ORDER — FENTANYL CITRATE (PF) 100 MCG/2ML IJ SOLN
INTRAMUSCULAR | Status: DC | PRN
  Administered 2020-05-28: 25 ug via INTRAVENOUS

## 2020-05-28 MED ORDER — HEPARIN SODIUM (PORCINE) 5000 UNIT/ML IJ SOLN
5000.0000 [IU] | Freq: Three times a day (TID) | INTRAMUSCULAR | Status: DC
  Administered 2020-05-28 – 2020-05-29 (×2): 5000 [IU] via SUBCUTANEOUS
  Filled 2020-05-28 (×2): qty 1

## 2020-05-28 MED ORDER — ASPIRIN 81 MG PO CHEW
81.0000 mg | CHEWABLE_TABLET | ORAL | Status: AC
  Administered 2020-05-28: 81 mg via ORAL
  Filled 2020-05-28: qty 1

## 2020-05-28 SURGICAL SUPPLY — 22 items
BALLN EMERGE MR 2.25X12 (BALLOONS) ×2
BALLN SAPPHIRE ~~LOC~~ 2.5X12 (BALLOONS) ×1 IMPLANT
BALLN WOLVERINE 2.25X10 (BALLOONS) ×2
BALLOON EMERGE MR 2.25X12 (BALLOONS) IMPLANT
BALLOON WOLVERINE 2.25X10 (BALLOONS) IMPLANT
CATH EXPO 5F MPA-1 (CATHETERS) ×1 IMPLANT
CATH INFINITI 5 FR IM (CATHETERS) ×1 IMPLANT
CATH INFINITI 5FR MULTPACK ANG (CATHETERS) ×1 IMPLANT
CATH LAUNCHER 6FR AL1 (CATHETERS) IMPLANT
CATHETER LAUNCHER 6FR AL1 (CATHETERS) ×2
KIT ENCORE 26 ADVANTAGE (KITS) ×1 IMPLANT
KIT HEART LEFT (KITS) ×2 IMPLANT
KIT MICROPUNCTURE NIT STIFF (SHEATH) ×1 IMPLANT
PACK CARDIAC CATHETERIZATION (CUSTOM PROCEDURE TRAY) ×2 IMPLANT
SHEATH PINNACLE 5F 10CM (SHEATH) ×1 IMPLANT
SHEATH PINNACLE 6F 10CM (SHEATH) ×1 IMPLANT
SHEATH PROBE COVER 6X72 (BAG) ×1 IMPLANT
TRANSDUCER W/STOPCOCK (MISCELLANEOUS) ×2 IMPLANT
TUBING CIL FLEX 10 FLL-RA (TUBING) ×2 IMPLANT
WIRE EMERALD 3MM-J .035X150CM (WIRE) ×1 IMPLANT
WIRE HI TORQ VERSACORE-J 145CM (WIRE) ×1 IMPLANT
WIRE RUNTHROUGH .014X180CM (WIRE) ×1 IMPLANT

## 2020-05-28 NOTE — Consult Note (Signed)
Cardiology Consultation:   Patient ID: [name] MRN: [id number]; DOB: April 15, 1946  Admit date: 05/27/2020 Date of Consult: 05/28/2020  Primary Care Provider: Dorothyann Peng, NP [facility] Cardiologist: Kirk Ruths, MD  [facility] Electrophysiologist:  None    Patient Profile:   [name] is a 74 y.o. male with a hx of HIV, CAD with CABG 2003, PCI of graft to ramus and OM branches 2018, PCI 2019 of VG to diag.  PCI to proximal graft, VG to OM jump graft.  Second part of this jump graft which was previously ballooned in 2018 was occluded.  Lt to lt collaterals fill the distal LCX who is being seen today for the evaluation of chest pain at the request of Dr. Eliseo Squires.  History of Present Illness:   [name] with above hx. of CAD and mild AS, DM-2, HLD and COPD.  Last echo 01/2020 with EF 60-65% G1DD, moderate concentric LVH, G1DD.  RV size is mildly enlarged.     Pt presented to ER 05/27/20 with chest pain.  Occurred on the 24th while driving he developed chest discomfort.  Sharp midsternal, non radiation, mod to severe in intensity.  Lasted several hours.  + SOB and diaphoresis. 2 NTG SL with some improvement.  Though still with chest pain in ER, more SL NTG given.     Now on IV heparin and IV NTG. Still with short episodes of chest pain. Do not last long.  Yesterday it felt like his angina.   EKG:  The EKG was personally reviewed and demonstrates:  SB at 59  LVH, Q waves in inf leads.-old    Telemetry:  Telemetry was personally reviewed and demonstrates:  SR Troponin pk 1001 up from 154  Na 139, K+ 3.8, glucose 236, Cr 1.56 down from 1.71 on admit  Hgb 12.4 6.7 WBC  plts 169  COVID neg   CXR IMPRESSION: Left basilar atelectasis/scar without superimposed acute cardiopulmonary disease.  Past Medical History:  Diagnosis Date   Absolute anemia 05/28/2014   Aortic stenosis 09/17/2015   Arthritis    Ascites 11/18/2013   BELLS PALSY 07/19/2010   Qualifier: Diagnosis  of  By: [name], [name]     CAD (coronary artery disease)    a. s/p CABG in 2003 b. s/p PCI to SVG-PDA in 07/2015 c. 11/2016: cath showing severe native CAD with patent LIMA-LAD and SVG-D1 with 80% stenosis of SVG-OM1-OM2. Initially medical management was recommended --> presented with recurrent angina --> s/p Synergy DES to proximal body of SVG-OM1-OM2, POBA to distal graft.    CAD- S/P PCI SVG-OM1 12/01/16 05/12/2006   Qualifier: Diagnosis of  By: [name], [name]     Carotid bruit 07/10/2015   Chest pain 11/22/2016   Chronic kidney disease    Chronic renal insufficiency, stage III (moderate) ([name]) 12/02/2016   Constipation 05/28/2014   COPD 07/20/2008   Qualifier: Diagnosis of  By: [name], [name]    DEPRESSION 09/03/2006   Qualifier: Diagnosis of  By: [name], [name]     Dermatitis 11/27/2012   Diabetes mellitus    Diarrhea 11/20/2013   DM (diabetes mellitus), type 2 with ophthalmic complications ([place]) 0/05/2724   Qualifier: Diagnosis of  By: [name], [name]     Dry eye syndrome 12/20/2010   Epicondylitis 06/05/2013   right   Erectile dysfunction 01/01/2012   Essential hypertension 05/12/2006   Qualifier: Diagnosis of  By: [name], [name]     GENITAL HERPES 05/03/2009  Qualifier: Diagnosis of  By: [name], [name]     GERD 09/03/2006   Qualifier: Diagnosis of  By: [name], [name]     GERD (gastroesophageal reflux disease)    HEARING LOSS, SENSORINEURAL 05/12/2006   Qualifier: Diagnosis of  By: [name], [name]     HEMATOCHEZIA 04/18/2008   Annotation: 9/09 during bout of constipation Qualifier: Diagnosis of  By: [name], [name]     HIP PAIN, BILATERAL 07/17/2008   Qualifier: Diagnosis of  By: [name], [name]    History of depression    History of kidney stones    HIV (human immunodeficiency virus infection) ([name]) 1991   on meds since initial dx.    HLD (hyperlipidemia) 11/19/2013   Human immunodeficiency virus (HIV) disease ([name]) 05/12/2006    Qualifier: Diagnosis of  By: [name], [name]     Hx of CABG 09/03/2006   Annotation: 2003 Qualifier: Diagnosis of  By: [name], [name]     Hyperkalemia 03/23/2014   Hyperlipidemia    Hypertension    Hyponatremia 03/23/2014   INGUINAL LYMPHADENOPATHY, RIGHT 04/03/2009   Qualifier: Diagnosis of  By: [name], [name] 01/24/2015   KNEE PAIN, BILATERAL 07/17/2008   Qualifier: Diagnosis of  By: [name], [name]    Lesion of breast 07/21/2015   Lipodystrophy 12/20/2010   Memory loss 09/18/2008   Qualifier: Diagnosis of  By: [name], [name]    Metatarsal deformity 01/24/2015   Nasal abscess 12/04/2015   Night sweats 07/06/2012   Nocturia 03/06/2013   NSTEMI (non-ST elevated myocardial infarction) ([name]) 11/29/2016   Pain in joint, ankle and foot 01/19/2015   Pancreatitis 11/2013   attributed to HIV meds.    Pedal edema 05/21/2014   PERIPHERAL VASCULAR DISEASE 07/20/2008   Qualifier: Diagnosis of  By: [name], [name]    Posterior cervical lymphadenopathy 03/30/2014   Protein-calorie malnutrition, severe ([name]) 03/23/2014   SHINGLES, HX OF 05/03/2009   Annotation: R leg Qualifier: Diagnosis of  By: [name], [name]     STEMI (ST elevation myocardial infarction) ([place]) 07/29/2015   Unstable angina ([place]) 11/24/2016   WEIGHT LOSS, ABNORMAL 04/03/2009   Qualifier: Diagnosis of  By: [name], [name]      Past Surgical History:  Procedure Laterality Date   CARDIAC CATHETERIZATION N/A 07/29/2015   Procedure: Left Heart Cath and Coronary Angiography;  Surgeon: [name], MD;  Location: [place] [facility];  Service: Cardiovascular;  Laterality: N/A;   CARDIAC CATHETERIZATION N/A 07/29/2015   Procedure: Coronary Stent Intervention;  Surgeon: [name], MD;  Location: [place] [facility];  Service: Cardiovascular;  Laterality: N/A;   COLONOSCOPY  12/02/2019   CORONARY ARTERY BYPASS GRAFT  05/2002   CORONARY STENT INTERVENTION N/A 12/01/2016   Procedure:  Coronary Stent Intervention;  Surgeon: [name], MD;  Location: [place] [facility];  Service: Cardiovascular;  Laterality: N/A;   CORONARY STENT INTERVENTION N/A 10/21/2017   Procedure: CORONARY STENT INTERVENTION;  Surgeon: Jettie Booze, MD;  Location: [place] [facility];  Service: Cardiovascular;  Laterality: N/A;   LEFT HEART CATH AND CORS/GRAFTS ANGIOGRAPHY N/A 11/24/2016   Procedure: Left Heart Cath and Cors/Grafts Angiography;  Surgeon: Nelva Bush, MD;  Location: [place] [facility];  Service: Cardiovascular;  Laterality: N/A;   LEFT HEART CATH AND CORS/GRAFTS ANGIOGRAPHY N/A 12/01/2016   Procedure: Left Heart Cath and Cors/Grafts Angiography;  Surgeon: [name], MD;  Location: [place] [facility];  Service: Cardiovascular;  Laterality: N/A;   LEFT HEART CATH AND CORS/GRAFTS ANGIOGRAPHY N/A 10/21/2017   Procedure: LEFT HEART CATH AND CORS/GRAFTS ANGIOGRAPHY;  Surgeon: Jettie Booze, MD;  Location: [place] [facility];  Service: Cardiovascular;  Laterality: N/A;   VASECTOMY       Home Medications:  Prior to Admission medications   Medication Sig Start Date End Date Taking? Authorizing Provider  acetaminophen (TYLENOL) 325 MG tablet Take 2 tablets (650 mg total) by mouth every 4 (four) hours as needed for headache or mild pain. 12/02/16  Yes Kilroy, Doreene Burke, PA-C  aspirin 81 MG chewable tablet Chew 81 mg by mouth at bedtime.    Yes [provider]  atorvastatin (LIPITOR) 20 MG tablet TAKE 1 TABLET(20 MG) BY MOUTH DAILY Patient taking differently: Take 20 mg by mouth at bedtime.  02/13/20  Yes Michel Bickers, MD  b complex vitamins tablet Take 1 tablet by mouth daily. Patient taking differently: Take 1 tablet by mouth daily with breakfast.  09/25/16  Yes Mosie Lukes, MD  Cholecalciferol (VITAMIN D3) 50 MCG (2000 UT) TABS Take 2,000 Units by mouth in the morning.   Yes [provider]  dolutegravir (TIVICAY) 50 MG tablet TAKE 1 TABLET BY  MOUTH DAILY with Pifeltro Patient taking differently: Take 50 mg by mouth See admin instructions. Take 50 mg by mouth in the morning (with Pifeltro) 05/18/20  Yes Michel Bickers, MD  doravirine (PIFELTRO) 100 MG TABS tablet TAKE 1 TABLET(100 MG) BY MOUTH DAILY with Tivicay Patient taking differently: Take 100 mg by mouth See admin instructions. Take 100 mg by mouth in the morning (with Tivicay) 05/18/20  Yes Michel Bickers, MD  fenofibrate 54 MG tablet TAKE 1 TABLET(54 MG) BY MOUTH DAILY Patient taking differently: Take 54 mg by mouth at bedtime.  09/28/19  Yes Almyra Deforest, PA  furosemide (LASIX) 20 MG tablet Take 2 tablets (40 mg total) by mouth daily. Patient taking differently: Take 20-40 mg by mouth See admin instructions. Take 20 mg by mouth in the morning on Sun/Tues/Thurs/Sat and 40 mg on Mon/Wed/Fri 01/10/20  Yes Nafziger, Tommi Rumps, NP  isosorbide mononitrate (IMDUR) 30 MG 24 hr tablet Take 1 tablet (30 mg total) by mouth daily. 01/16/20  Yes Lelon Perla, MD  lisinopril (ZESTRIL) 5 MG tablet TAKE 1 TABLET(5 MG) BY MOUTH DAILY Patient taking differently: Take 5 mg by mouth daily.  11/11/19  Yes Barrett, Evelene Croon, PA-C  metoprolol tartrate (LOPRESSOR) 25 MG tablet Take 0.5 tablets (12.5 mg total) by mouth 2 (two) times daily. NEED APPOINTMENT FOR FUTURE REFILL Patient taking differently: Take 12.5 mg by mouth 2 (two) times daily.  02/13/20  Yes Lelon Perla, MD  nitroGLYCERIN (NITROSTAT) 0.4 MG SL tablet PLACE 1 TABLET UNDER THE TONGUE EVERY 5 MINUTES AS NEEDED FOR CHEST PAIN Patient taking differently: Place 0.4 mg under the tongue every 5 (five) minutes as needed for chest pain.  03/19/20  Yes Lelon Perla, MD  pantoprazole (PROTONIX) 40 MG tablet TAKE 1 TABLET(40 MG) BY MOUTH DAILY Patient taking differently: Take 40 mg by mouth daily before breakfast.  10/12/19  Yes Crenshaw, Denice Bors, MD  potassium chloride SA (KLOR-CON) 20 MEQ tablet TAKE 1 TABLET(20 MEQ) BY MOUTH THREE TIMES  DAILY Patient taking differently: Take 20 mEq by mouth 3 (three) times daily.  11/29/19  Yes Nafziger, Tommi Rumps, NP  Probiotic Product (PROBIOTIC DAILY) CAPS Take 1 by mouth daily Patient taking differently:  Take 1 capsule by mouth daily with breakfast.  09/25/16  Yes Mosie Lukes, MD  repaglinide (PRANDIN) 2 MG tablet Take 2-4 mg by mouth See admin instructions. Take 4 mg by mouth in the morning before breakfast and 2 mg before lunch and dinner/evening meal   Yes [provider]  sodium bicarbonate 650 MG tablet Take 650 mg by mouth 3 (three) times daily.   Yes [provider]  tamsulosin (FLOMAX) 0.4 MG CAPS capsule Take 0.4 mg by mouth in the morning and at bedtime.  05/19/18  Yes [provider]  valACYclovir (VALTREX) 500 MG tablet TAKE 1 TABLET BY MOUTH DAILY Patient taking differently: Take 500 mg by mouth in the morning.  03/27/20  Yes Michel Bickers, MD  VASCEPA 1 g capsule TAKE 2 CAPSULES(2 GRAMS) BY MOUTH TWICE DAILY Patient taking differently: Take 2 g by mouth 2 (two) times daily.  03/16/20  Yes Elayne Snare, MD  Cholecalciferol 2000 units CAPS Take 1 capsule (2,000 Units total) by mouth daily. Patient not taking: Reported on 05/27/2020 09/25/16   Mosie Lukes, MD  glucose blood (FREESTYLE TEST STRIPS) test strip USE TO CHECK BLOOD SUGAR THREE TIMES DAILY 11/18/19   Elayne Snare, MD  Lancets (FREESTYLE) lancets Use as directed three times a day to check blood sugar.  DX E11.9 11/03/17   Elayne Snare, MD  pioglitazone (ACTOS) 15 MG tablet TAKE 1 TABLET(15 MG) BY MOUTH DAILY Patient not taking: Reported on 05/27/2020 05/13/19   Elayne Snare, MD    Inpatient Medications: Scheduled Meds:  acidophilus  1 capsule Oral Q breakfast   aspirin  325 mg Oral Daily   atorvastatin  40 mg Oral QPM   dolutegravir  50 mg Oral Daily   doravirine  100 mg Oral Daily   fenofibrate  54 mg Oral QHS   icosapent Ethyl  2 g Oral BID   insulin aspart  0-15 Units Subcutaneous TID  AC & HS   metoprolol tartrate  12.5 mg Oral BID   pantoprazole  40 mg Oral Daily   sodium bicarbonate  650 mg Oral TID   tamsulosin  0.4 mg Oral BID   valACYclovir  500 mg Oral Daily   Continuous Infusions:  heparin 850 Units/hr (05/28/20 0549)   nitroGLYCERIN 10 mcg/min (05/28/20 0615)   PRN Meds: acetaminophen, ondansetron (ZOFRAN) IV, perflutren lipid microspheres (DEFINITY) IV suspension  Allergies:   No Known Allergies  Social History:   Social History   Socioeconomic History   Marital status: Divorced    Spouse name: Not on file   Number of children: 4   Years of education: Not on file   Highest education level: Not on file  Occupational History   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   Tobacco Use   Smoking status: Never Smoker   Smokeless tobacco: Never Used  Vaping Use   Vaping Use: Never used  Substance and Sexual Activity   Alcohol use: Not Currently    Alcohol/week: 0.0 standard drinks    Comment: rare   Drug use: No   Sexual activity: Not Currently    Comment: declined condoms  Other Topics Concern   Not on file  Social History Narrative   Lives alone.  Supportive friends and family.  His HIV Dx is not a secret.    Social Determinants of Health   Financial Resource Strain: Low Risk    Difficulty of Paying Living  Expenses: Not hard at all  Food Insecurity: No Food Insecurity   Worried About Charity fundraiser in the Last Year: Never true   Ran Out of Food in the Last Year: Never true  Transportation Needs: No Transportation Needs   Lack of Transportation (Medical): No   Lack of Transportation (Non-Medical): No  Physical Activity: Sufficiently Active   Days of Exercise per Week: 7 days   Minutes of Exercise per Session: 40 min  Stress: No Stress Concern Present   Feeling of Stress : Not at all  Social Connections: Moderately Isolated   Frequency of  Communication with Friends and Family: More than three times a week   Frequency of Social Gatherings with Friends and Family: Three times a week   Attends Religious Services: More than 4 times per year   Active Member of Clubs or Organizations: No   Attends Archivist Meetings: Never   Marital Status: Divorced  Human resources officer Violence: Not At Risk   Fear of Current or Ex-Partner: No   Emotionally Abused: No   Physically Abused: No   Sexually Abused: No    Family History:    Family History  Problem Relation Age of Onset   Hyperlipidemia Mother    Diabetes Mother        paternal grandparents/1 brother   Hyperlipidemia Father    Hypertension Father        paternal grandmother/3 brothers/1 sister   Arthritis Other        mother/father/paternal grandparents   Breast cancer Maternal Aunt        paternal aunt   Lung cancer Maternal Aunt    Heart disease Other        parents/maternal grandparents/ 2 brothers   Stroke Paternal Grandmother    Mental retardation Sister    Colon cancer Neg Hx    Esophageal cancer Neg Hx    Pancreatic cancer Neg Hx    Stomach cancer Neg Hx    Liver disease Neg Hx    Colon polyps Neg Hx    Rectal cancer Neg Hx      ROS:  Please see the history of present illness.  General:no colds or fevers, no weight changes Skin:no rashes or ulcers HEENT:no blurred vision, no congestion CV:see HPI PUL:see HPI GI:no diarrhea constipation or melena, no indigestion GU:no hematuria, no dysuria MS:no joint pain, no claudication Neuro:no syncope, no lightheadedness Endo:+ diabetes- pretty well controlled, no thyroid disease  All other ROS reviewed and negative.     Physical Exam/Data:   Vitals:   05/28/20 0432 05/28/20 0600 05/28/20 0803 05/28/20 1121  BP: 135/67 (!) 168/70 (!) 155/68 (!) 159/75  Pulse: 63 60 (!) 57 60  Resp: 12 14 14 13   Temp: 97.8 F (36.6 C)   97.9 F (36.6 C)  TempSrc: Oral   Oral  SpO2: 99%  98% 97% 98%  Weight:      Height:        Intake/Output Summary (Last 24 hours) at 05/28/2020 1125 Last data filed at 05/28/2020 1100 Gross per 24 hour  Intake 241.05 ml  Output 1325 ml  Net -1083.95 ml   Last 3 Weights 05/27/2020 05/27/2020 05/22/2020  Weight (lbs) 136 lb 14.5 oz 134 lb 134 lb  Weight (kg) 62.1 kg 60.782 kg 60.782 kg     Body mass index is 24.25 kg/m.  General:  Well nourished, well developed, in no acute distress HEENT: normal Lymph: no adenopathy Neck: no JVD Endocrine:  No  thryomegaly Vascular: No carotid bruits; peal pulses 2+ bilaterally  Cardiac:  normal S1, S2; RRR; 9-3/8 systolic murmur no gallup or rub Lungs:  clear to auscultation bilaterally, no wheezing, rhonchi or rales  Abd: soft, nontender, no hepatomegaly  Ext: no edema Musculoskeletal:  No deformities, BUE and BLE strength normal and equal Skin: warm and dry  Neuro:  CNs 2-12 intact, no focal abnormalities noted Psych:  Normal affect    Relevant CV Studies: Echo 01/20/20  IMPRESSIONS    1. Left ventricular ejection fraction, by estimation, is 60 to 65%. The  left ventricle has normal function. The left ventricle has no regional  wall motion abnormalities. There is moderate concentric left ventricular  hypertrophy. Left ventricular  diastolic parameters are consistent with Grade I diastolic dysfunction  (impaired relaxation).  2. Right ventricular systolic function is normal. The right ventricular  size is mildly enlarged. There is normal pulmonary artery systolic  pressure.  3. Left atrial size was mildly dilated.  4. The mitral valve is normal in structure. Mild mitral valve  regurgitation. No evidence of mitral stenosis.  5. The aortic valve is normal in structure. Aortic valve regurgitation is  mild. Mild aortic valve stenosis. Aortic valve mean gradient measures 13.0  mmHg.  6. The inferior vena cava is normal in size with greater than 50%  respiratory variability,  suggesting right atrial pressure of 3 mmHg.   FINDINGS  Left Ventricle: Left ventricular ejection fraction, by estimation, is 60  to 65%. The left ventricle has normal function. The left ventricle has no  regional wall motion abnormalities. The left ventricular internal cavity  size was normal in size. There is  moderate concentric left ventricular hypertrophy. Left ventricular  diastolic parameters are consistent with Grade I diastolic dysfunction  (impaired relaxation). Normal left ventricular filling pressure.   Right Ventricle: The right ventricular size is mildly enlarged. No  increase in right ventricular wall thickness. Right ventricular systolic  function is normal. There is normal pulmonary artery systolic pressure.  The tricuspid regurgitant velocity is 2.34  m/s, and with an assumed right atrial pressure of 3 mmHg, the estimated  right ventricular systolic pressure is 10.1 mmHg.   Left Atrium: Left atrial size was mildly dilated.   Right Atrium: Right atrial size was normal in size.   Pericardium: There is no evidence of pericardial effusion.   Mitral Valve: The mitral valve is normal in structure. There is mild  thickening of the mitral valve leaflet(s). There is moderate calcification  of the mitral valve leaflet(s). Normal mobility of the mitral valve  leaflets. Moderate mitral annular  calcification. Mild mitral valve regurgitation. No evidence of mitral  valve stenosis.   Tricuspid Valve: The tricuspid valve is normal in structure. Tricuspid  valve regurgitation is mild . No evidence of tricuspid stenosis.   Aortic Valve: The aortic valve is normal in structure.. There is severe  thickening and severe calcifcation of the aortic valve. Aortic valve  regurgitation is mild. Aortic regurgitation PHT measures 593 msec. Mild  aortic stenosis is present. There is  severe thickening of the aortic valve. There is severe calcifcation of the  aortic valve. Aortic valve  mean gradient measures 13.0 mmHg. Aortic valve  peak gradient measures 26.1 mmHg. Aortic valve area, by VTI measures 1.18  cm.   Pulmonic Valve: The pulmonic valve was normal in structure. Pulmonic valve  regurgitation is trivial. No evidence of pulmonic stenosis.   Aorta: The aortic root is normal in size and  structure.   Venous: The inferior vena cava is normal in size with greater than 50%  respiratory variability, suggesting right atrial pressure of 3 mmHg.   IAS/Shunts: No atrial level shunt detected by color flow Doppler.  Intervention     Echo pending  CATH AND PCI 10/2017     Mid LAD lesion is 80% stenosed. LIMA to LAD is patent.  Prox RCA to Mid RCA lesion is 100% stenosed. SVG to PDA is occluded.  Ost Cx to Prox Cx lesion is 100% stenosed.  Ost 2nd Diag to 2nd Diag lesion is 75% stenosed.  A drug-eluting stent was successfully placed using a STENT SYNERGY DES 2.5X20.  Post intervention, there is a 0% residual stenosis.  Mid Graft lesion is 80% stenosed.  A drug-eluting stent was successfully placed using a STENT SYNERGY DES 3X16.  Post intervention, there is a 0% residual stenosis.  Previously placed Prox Graft stent (unknown type) before Ost 2nd Mrg is widely patent. Origin lesion before stent to Ost 2nd Mrg is 90% stenosed.  A drug-eluting stent was successfully placed using a STENT SYNERGY DES 3.5X20, postdilated to > 4 mm.  Post intervention, there is a 0% residual stenosis.  Second part of this jump graft which previously was cballooned in 4/18, is now occluded. Left to left collaterals fill the distal circumflex.  LV end diastolic pressure is low.  There is no aortic valve stenosis.   Successful PCI of SVG to diagonal.  Successful PCI to the proximal graft, SVG to OM jump graft.  Second part of this jump graft which previously was ballooned in 4/18, is now occluded.  Left to left collaterals fill the distal circumflex.  Continue DAPT for one year.     Laboratory Data:  High Sensitivity Troponin:   Recent Labs  Lab 05/27/20 1830 05/27/20 2042 05/28/20 0222  TROPONINIHS 154* 188* 1,001*     Chemistry Recent Labs  Lab 05/27/20 1830 05/28/20 0222  NA 137 139  K 4.2 3.8  CL 106 108  CO2 23 22  GLUCOSE 212* 236*  BUN 21 21  CREATININE 1.71* 1.56*  CALCIUM 8.8* 8.8*  GFRNONAA 42* 47*  ANIONGAP 8 9    Recent Labs  Lab 05/28/20 0222  PROT 6.1*  ALBUMIN 3.2*  AST 30  ALT 31  ALKPHOS 45  BILITOT 0.7   Hematology Recent Labs  Lab 05/27/20 1830 05/28/20 0222  WBC 5.0 6.7  RBC 3.96* 3.86*  HGB 12.7* 12.4*  HCT 38.6* 36.7*  MCV 97.5 95.1  MCH 32.1 32.1  MCHC 32.9 33.8  RDW 13.1 13.0  PLT 182 169   BNPNo results for input(s): BNP, PROBNP in the last 168 hours.  DDimer No results for input(s): DDIMER in the last 168 hours.   Radiology/Studies:  DG Chest 2 View  Result Date: 05/27/2020 CLINICAL DATA:  Chest pain. EXAM: CHEST - 2 VIEW COMPARISON:  Multiple priors, most recent October 20, 2017 FINDINGS: Similar cardiomediastinal silhouette. Postsurgical changes of CABG and median sternotomy. Linear and hazy left basilar opacities, similar over multiple priors and likely related to atelectasis or scar. No new focal consolidation. No pleural effusions or pneumothorax. No acute osseous abnormality. IMPRESSION: Left basilar atelectasis/scar without superimposed acute cardiopulmonary disease. Electronically Signed   By: Margaretha Sheffield MD   On: 05/27/2020 19:13     Assessment and Plan:   1. NSTEMI in pt with known CAD and hx of CABG and multiple PCIs last 2019 with PCI to SVG to diag,  PCI to proximal graft, VG on Om Jump graft.   Currently  on IV heparin, IV NTG and BB, ASA-325 mg , statin, home meds imdur, lopressor 12.5 BID lasix on hold and ACE  Most likely cath today.  Pt is NPO  2. CAD with prior CABG and stents. Now on ASA alone 3. Mild AS on last echo 4. HTN controlled 5. HLD on statin  6. CKD-3  Average  1.77 looking back   7. DM-2 per IM  8. HIV per IM  The patient understands that risks included but are not limited to stroke (1 in 1000), death (1 in 45), kidney failure [usually temporary] (1 in 500), bleeding (1 in 200), allergic reaction [possibly serious] (1 in 200).        TIMI Risk Score for Unstable Angina or Non-ST Elevation MI:   The patient's TIMI risk score is 6, which indicates a 41% risk of all cause mortality, new or recurrent myocardial infarction or need for urgent revascularization in the next 14 days.           For questions or updates, please contact [place] Please consult [email or web address] for contact info under    Signed, Cecilie Kicks, NP  05/28/2020 11:25 AM

## 2020-05-28 NOTE — Brief Op Note (Signed)
BRIEF CARDIAC CATHETERIZATION NOTE  05/28/2020  3:55 PM  PATIENT:  [name]  74 y.o. male  PRE-OPERATIVE DIAGNOSIS:  NSTEMI  POST-OPERATIVE DIAGNOSIS:  NSTEMI  PROCEDURE:  Procedure(s): LEFT HEART CATH AND CORS/GRAFTS ANGIOGRAPHY (N/A) CORONARY BALLOON ANGIOPLASTY (N/A)  SURGEON:  Surgeon(s) and Role:    * Abdalla Naramore, Harrell Gave, MD - Primary  FINDINGS: 1. Severe native CAD, including 90% ostial LAD, functionally occluded mid LAD, occluded mid LCx, and occluded ostial RCA. 2. Widely patent LIMA-LAD. 3. Patent SVG-D1 with 90% stenosis in stent covering the distal anastomosis. 4. Patent SVG-ramus intermedius with chronic occlusion of jump portion to OM1.  There is 30-40% in-stent restenosis involving the ostium of SVG-ramus-OM1. 5. Chronically occluded SVG-rPDA and rPL. 6. Normal LVEDP. 7. Successful PTCA to distal anastomotic stent involving SVG-D1 with 0% residual stenosis and TIMI-3 flow.  RECOMMENDATIONS: 1. DAPT with ASA and ticagrelor for at least 12 months, ideally longer. 2. Aggressive secondary prevention.  Nelva Bush, MD [facility]

## 2020-05-28 NOTE — Progress Notes (Signed)
  Echocardiogram 2D Echocardiogram has been performed.  [name] 05/28/2020, 10:52 AM

## 2020-05-28 NOTE — Plan of Care (Signed)
  Problem: Education: Goal: Knowledge of General Education information will improve Description: Including pain rating scale, medication(s)/side effects and non-pharmacologic comfort measures Outcome: Progressing   Problem: Health Behavior/Discharge Planning: Goal: Ability to manage health-related needs will improve Outcome: Progressing   Problem: Clinical Measurements: Goal: Ability to maintain clinical measurements within normal limits will improve Outcome: Progressing   Problem: Nutrition: Goal: Adequate nutrition will be maintained Outcome: Progressing   Problem: Pain Managment: Goal: General experience of comfort will improve Outcome: Progressing   Problem: Skin Integrity: Goal: Risk for impaired skin integrity will decrease Outcome: Progressing   

## 2020-05-28 NOTE — Progress Notes (Signed)
Site area: rt groin arterial site Site Prior to Removal:  Level 0 Pressure Applied For: 20 minutes Manual:   yes Patient Status During Pull:  stable Post Pull Site:  Level 0 Post Pull Instructions Given:  yes Post Pull Pulses Present: rt pt dopplered Dressing Applied:  Gauze and tegaderm Bedrest begins @ 0881 Comments:

## 2020-05-28 NOTE — H&P (View-Only) (Signed)
Cardiology Consultation:   Patient ID: [name] MRN: [id number]; DOB: 1946/05/28  Admit date: 05/27/2020 Date of Consult: 05/28/2020  Primary Care Provider: Dorothyann Peng, NP [facility] Cardiologist: Kirk Ruths, MD  [facility] Electrophysiologist:  None    Patient Profile:   [name] is a 74 y.o. male with a hx of HIV, CAD with CABG 2003, PCI of graft to ramus and OM branches 2018, PCI 2019 of VG to diag.  PCI to proximal graft, VG to OM jump graft.  Second part of this jump graft which was previously ballooned in 2018 was occluded.  Lt to lt collaterals fill the distal LCX who is being seen today for the evaluation of chest pain at the request of Dr. Eliseo Squires.  History of Present Illness:   [name] with above hx. of CAD and mild AS, DM-2, HLD and COPD.  Last echo 01/2020 with EF 60-65% G1DD, moderate concentric LVH, G1DD.  RV size is mildly enlarged.     Pt presented to ER 05/27/20 with chest pain.  Occurred on the 24th while driving he developed chest discomfort.  Sharp midsternal, non radiation, mod to severe in intensity.  Lasted several hours.  + SOB and diaphoresis. 2 NTG SL with some improvement.  Though still with chest pain in ER, more SL NTG given.     Now on IV heparin and IV NTG. Still with short episodes of chest pain. Do not last long.  Yesterday it felt like his angina.   EKG:  The EKG was personally reviewed and demonstrates:  SB at 59  LVH, Q waves in inf leads.-old    Telemetry:  Telemetry was personally reviewed and demonstrates:  SR Troponin pk 1001 up from 154  Na 139, K+ 3.8, glucose 236, Cr 1.56 down from 1.71 on admit  Hgb 12.4 6.7 WBC  plts 169  COVID neg   CXR IMPRESSION: Left basilar atelectasis/scar without superimposed acute cardiopulmonary disease.  Past Medical History:  Diagnosis Date  . Absolute anemia 05/28/2014  . Aortic stenosis 09/17/2015  . Arthritis   . Ascites 11/18/2013  . BELLS PALSY 07/19/2010   Qualifier: Diagnosis  of  By: [name], [name]    . CAD (coronary artery disease)    a. s/p CABG in 2003 b. s/p PCI to SVG-PDA in 07/2015 c. 11/2016: cath showing severe native CAD with patent LIMA-LAD and SVG-D1 with 80% stenosis of SVG-OM1-OM2. Initially medical management was recommended --> presented with recurrent angina --> s/p Synergy DES to proximal body of SVG-OM1-OM2, POBA to distal graft.   Marland Kitchen CAD- S/P PCI SVG-OM1 12/01/16 05/12/2006   Qualifier: Diagnosis of  By: [name], [name]    . Carotid bruit 07/10/2015  . Chest pain 11/22/2016  . Chronic kidney disease   . Chronic renal insufficiency, stage III (moderate) ([place]) 12/02/2016  . Constipation 05/28/2014  . COPD 07/20/2008   Qualifier: Diagnosis of  By: [name], [name]   . DEPRESSION 09/03/2006   Qualifier: Diagnosis of  By: [name], [name]    . Dermatitis 11/27/2012  . Diabetes mellitus   . Diarrhea 11/20/2013  . DM (diabetes mellitus), type 2 with ophthalmic complications ([place]) 0/05/2724   Qualifier: Diagnosis of  By: [name], [name]    . Dry eye syndrome 12/20/2010  . Epicondylitis 06/05/2013   right  . Erectile dysfunction 01/01/2012  . Essential hypertension 05/12/2006   Qualifier: Diagnosis of  By: [name], [name]    . GENITAL HERPES 05/03/2009  Qualifier: Diagnosis of  By: [name], [name]    . GERD 09/03/2006   Qualifier: Diagnosis of  By: [name], [name]    . GERD (gastroesophageal reflux disease)   . HEARING LOSS, SENSORINEURAL 05/12/2006   Qualifier: Diagnosis of  By: [name], [name]    . HEMATOCHEZIA 04/18/2008   Annotation: 9/09 during bout of constipation Qualifier: Diagnosis of  By: [name], [name]    . HIP PAIN, BILATERAL 07/17/2008   Qualifier: Diagnosis of  By: [name], [name]   . History of depression   . History of kidney stones   . HIV (human immunodeficiency virus infection) ([place]) 1991   on meds since initial dx.   Marland Kitchen HLD (hyperlipidemia) 11/19/2013  . Human immunodeficiency virus (HIV) disease ([place]) 05/12/2006    Qualifier: Diagnosis of  By: [name], [name]    . Hx of CABG 09/03/2006   Annotation: 2003 Qualifier: Diagnosis of  By: [name], [name]    . Hyperkalemia 03/23/2014  . Hyperlipidemia   . Hypertension   . Hyponatremia 03/23/2014  . INGUINAL LYMPHADENOPATHY, RIGHT 04/03/2009   Qualifier: Diagnosis of  By: [name], [name]    . Keratoma 01/24/2015  . KNEE PAIN, BILATERAL 07/17/2008   Qualifier: Diagnosis of  By: [name], [name]   . Lesion of breast 07/21/2015  . Lipodystrophy 12/20/2010  . Memory loss 09/18/2008   Qualifier: Diagnosis of  By: [name], [name]   . Metatarsal deformity 01/24/2015  . Nasal abscess 12/04/2015  . Night sweats 07/06/2012  . Nocturia 03/06/2013  . NSTEMI (non-ST elevated myocardial infarction) ([place]) 11/29/2016  . Pain in joint, ankle and foot 01/19/2015  . Pancreatitis 11/2013   attributed to HIV meds.   . Pedal edema 05/21/2014  . PERIPHERAL VASCULAR DISEASE 07/20/2008   Qualifier: Diagnosis of  By: [name], [name]   . Posterior cervical lymphadenopathy 03/30/2014  . Protein-calorie malnutrition, severe ([place]) 03/23/2014  . SHINGLES, HX OF 05/03/2009   Annotation: R leg Qualifier: Diagnosis of  By: [name], [name]    . STEMI (ST elevation myocardial infarction) ([place]) 07/29/2015  . Unstable angina ([place]) 11/24/2016  . WEIGHT LOSS, ABNORMAL 04/03/2009   Qualifier: Diagnosis of  By: [name], [name]      Past Surgical History:  Procedure Laterality Date  . CARDIAC CATHETERIZATION N/A 07/29/2015   Procedure: Left Heart Cath and Coronary Angiography;  Surgeon: [name], MD;  Location: [place] [facility];  Service: Cardiovascular;  Laterality: N/A;  . CARDIAC CATHETERIZATION N/A 07/29/2015   Procedure: Coronary Stent Intervention;  Surgeon: [name], MD;  Location: [place] [facility];  Service: Cardiovascular;  Laterality: N/A;  . COLONOSCOPY  12/02/2019  . CORONARY ARTERY BYPASS GRAFT  05/2002  . CORONARY STENT INTERVENTION N/A 12/01/2016   Procedure:  Coronary Stent Intervention;  Surgeon: [name], MD;  Location: [place] [facility];  Service: Cardiovascular;  Laterality: N/A;  . CORONARY STENT INTERVENTION N/A 10/21/2017   Procedure: CORONARY STENT INTERVENTION;  Surgeon: Jettie Booze, MD;  Location: [place] [facility];  Service: Cardiovascular;  Laterality: N/A;  . LEFT HEART CATH AND CORS/GRAFTS ANGIOGRAPHY N/A 11/24/2016   Procedure: Left Heart Cath and Cors/Grafts Angiography;  Surgeon: Nelva Bush, MD;  Location: [place] [facility];  Service: Cardiovascular;  Laterality: N/A;  . LEFT HEART CATH AND CORS/GRAFTS ANGIOGRAPHY N/A 12/01/2016   Procedure: Left Heart Cath and Cors/Grafts Angiography;  Surgeon: [name], MD;  Location: [place] [facility];  Service: Cardiovascular;  Laterality: N/A;  . LEFT HEART CATH AND CORS/GRAFTS ANGIOGRAPHY N/A 10/21/2017   Procedure: LEFT HEART CATH AND CORS/GRAFTS ANGIOGRAPHY;  Surgeon: Jettie Booze, MD;  Location: [place] [facility];  Service: Cardiovascular;  Laterality: N/A;  . VASECTOMY       Home Medications:  Prior to Admission medications   Medication Sig Start Date End Date Taking? Authorizing Provider  acetaminophen (TYLENOL) 325 MG tablet Take 2 tablets (650 mg total) by mouth every 4 (four) hours as needed for headache or mild pain. 12/02/16  Yes Kilroy, Doreene Burke, PA-C  aspirin 81 MG chewable tablet Chew 81 mg by mouth at bedtime.    Yes [provider]  atorvastatin (LIPITOR) 20 MG tablet TAKE 1 TABLET(20 MG) BY MOUTH DAILY Patient taking differently: Take 20 mg by mouth at bedtime.  02/13/20  Yes Michel Bickers, MD  b complex vitamins tablet Take 1 tablet by mouth daily. Patient taking differently: Take 1 tablet by mouth daily with breakfast.  09/25/16  Yes Mosie Lukes, MD  Cholecalciferol (VITAMIN D3) 50 MCG (2000 UT) TABS Take 2,000 Units by mouth in the morning.   Yes [provider]  dolutegravir (TIVICAY) 50 MG tablet TAKE 1 TABLET BY  MOUTH DAILY with Pifeltro Patient taking differently: Take 50 mg by mouth See admin instructions. Take 50 mg by mouth in the morning (with Pifeltro) 05/18/20  Yes Michel Bickers, MD  doravirine (PIFELTRO) 100 MG TABS tablet TAKE 1 TABLET(100 MG) BY MOUTH DAILY with Tivicay Patient taking differently: Take 100 mg by mouth See admin instructions. Take 100 mg by mouth in the morning (with Tivicay) 05/18/20  Yes Michel Bickers, MD  fenofibrate 54 MG tablet TAKE 1 TABLET(54 MG) BY MOUTH DAILY Patient taking differently: Take 54 mg by mouth at bedtime.  09/28/19  Yes Almyra Deforest, PA  furosemide (LASIX) 20 MG tablet Take 2 tablets (40 mg total) by mouth daily. Patient taking differently: Take 20-40 mg by mouth See admin instructions. Take 20 mg by mouth in the morning on Sun/Tues/Thurs/Sat and 40 mg on Mon/Wed/Fri 01/10/20  Yes Nafziger, Tommi Rumps, NP  isosorbide mononitrate (IMDUR) 30 MG 24 hr tablet Take 1 tablet (30 mg total) by mouth daily. 01/16/20  Yes Lelon Perla, MD  lisinopril (ZESTRIL) 5 MG tablet TAKE 1 TABLET(5 MG) BY MOUTH DAILY Patient taking differently: Take 5 mg by mouth daily.  11/11/19  Yes Barrett, Evelene Croon, PA-C  metoprolol tartrate (LOPRESSOR) 25 MG tablet Take 0.5 tablets (12.5 mg total) by mouth 2 (two) times daily. NEED APPOINTMENT FOR FUTURE REFILL Patient taking differently: Take 12.5 mg by mouth 2 (two) times daily.  02/13/20  Yes Lelon Perla, MD  nitroGLYCERIN (NITROSTAT) 0.4 MG SL tablet PLACE 1 TABLET UNDER THE TONGUE EVERY 5 MINUTES AS NEEDED FOR CHEST PAIN Patient taking differently: Place 0.4 mg under the tongue every 5 (five) minutes as needed for chest pain.  03/19/20  Yes Lelon Perla, MD  pantoprazole (PROTONIX) 40 MG tablet TAKE 1 TABLET(40 MG) BY MOUTH DAILY Patient taking differently: Take 40 mg by mouth daily before breakfast.  10/12/19  Yes Crenshaw, Denice Bors, MD  potassium chloride SA (KLOR-CON) 20 MEQ tablet TAKE 1 TABLET(20 MEQ) BY MOUTH THREE TIMES  DAILY Patient taking differently: Take 20 mEq by mouth 3 (three) times daily.  11/29/19  Yes Nafziger, Tommi Rumps, NP  Probiotic Product (PROBIOTIC DAILY) CAPS Take 1 by mouth daily Patient taking differently:  Take 1 capsule by mouth daily with breakfast.  09/25/16  Yes Mosie Lukes, MD  repaglinide (PRANDIN) 2 MG tablet Take 2-4 mg by mouth See admin instructions. Take 4 mg by mouth in the morning before breakfast and 2 mg before lunch and dinner/evening meal   Yes [provider]  sodium bicarbonate 650 MG tablet Take 650 mg by mouth 3 (three) times daily.   Yes [provider]  tamsulosin (FLOMAX) 0.4 MG CAPS capsule Take 0.4 mg by mouth in the morning and at bedtime.  05/19/18  Yes [provider]  valACYclovir (VALTREX) 500 MG tablet TAKE 1 TABLET BY MOUTH DAILY Patient taking differently: Take 500 mg by mouth in the morning.  03/27/20  Yes Michel Bickers, MD  VASCEPA 1 g capsule TAKE 2 CAPSULES(2 GRAMS) BY MOUTH TWICE DAILY Patient taking differently: Take 2 g by mouth 2 (two) times daily.  03/16/20  Yes Elayne Snare, MD  Cholecalciferol 2000 units CAPS Take 1 capsule (2,000 Units total) by mouth daily. Patient not taking: Reported on 05/27/2020 09/25/16   Mosie Lukes, MD  glucose blood (FREESTYLE TEST STRIPS) test strip USE TO CHECK BLOOD SUGAR THREE TIMES DAILY 11/18/19   Elayne Snare, MD  Lancets (FREESTYLE) lancets Use as directed three times a day to check blood sugar.  DX E11.9 11/03/17   Elayne Snare, MD  pioglitazone (ACTOS) 15 MG tablet TAKE 1 TABLET(15 MG) BY MOUTH DAILY Patient not taking: Reported on 05/27/2020 05/13/19   Elayne Snare, MD    Inpatient Medications: Scheduled Meds: . acidophilus  1 capsule Oral Q breakfast  . aspirin  325 mg Oral Daily  . atorvastatin  40 mg Oral QPM  . dolutegravir  50 mg Oral Daily  . doravirine  100 mg Oral Daily  . fenofibrate  54 mg Oral QHS  . icosapent Ethyl  2 g Oral BID  . insulin aspart  0-15 Units Subcutaneous TID  AC & HS  . metoprolol tartrate  12.5 mg Oral BID  . pantoprazole  40 mg Oral Daily  . sodium bicarbonate  650 mg Oral TID  . tamsulosin  0.4 mg Oral BID  . valACYclovir  500 mg Oral Daily   Continuous Infusions: . heparin 850 Units/hr (05/28/20 0549)  . nitroGLYCERIN 10 mcg/min (05/28/20 0615)   PRN Meds: acetaminophen, ondansetron (ZOFRAN) IV, perflutren lipid microspheres (DEFINITY) IV suspension  Allergies:   No Known Allergies  Social History:   Social History   Socioeconomic History  . Marital status: Divorced    Spouse name: Not on file  . Number of children: 4  . Years of education: Not on file  . Highest education level: Not on file  Occupational History  . 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   Tobacco Use  . Smoking status: Never Smoker  . Smokeless tobacco: Never Used  Vaping Use  . Vaping Use: Never used  Substance and Sexual Activity  . Alcohol use: Not Currently    Alcohol/week: 0.0 standard drinks    Comment: rare  . Drug use: No  . Sexual activity: Not Currently    Comment: declined condoms  Other Topics Concern  . Not on file  Social History Narrative   Lives alone.  Supportive friends and family.  His HIV Dx is not a secret.    Social Determinants of Health   Financial Resource Strain: Low Risk   . Difficulty of Paying Living  Expenses: Not hard at all  Food Insecurity: No Food Insecurity  . Worried About Charity fundraiser in the Last Year: Never true  . Ran Out of Food in the Last Year: Never true  Transportation Needs: No Transportation Needs  . Lack of Transportation (Medical): No  . Lack of Transportation (Non-Medical): No  Physical Activity: Sufficiently Active  . Days of Exercise per Week: 7 days  . Minutes of Exercise per Session: 40 min  Stress: No Stress Concern Present  . Feeling of Stress : Not at all  Social Connections: Moderately Isolated  . Frequency of  Communication with Friends and Family: More than three times a week  . Frequency of Social Gatherings with Friends and Family: Three times a week  . Attends Religious Services: More than 4 times per year  . Active Member of Clubs or Organizations: No  . Attends Archivist Meetings: Never  . Marital Status: Divorced  Human resources officer Violence: Not At Risk  . Fear of Current or Ex-Partner: No  . Emotionally Abused: No  . Physically Abused: No  . Sexually Abused: No    Family History:    Family History  Problem Relation Age of Onset  . Hyperlipidemia Mother   . Diabetes Mother        paternal grandparents/1 brother  . Hyperlipidemia Father   . Hypertension Father        paternal grandmother/3 brothers/1 sister  . Arthritis Other        mother/father/paternal grandparents  . Breast cancer Maternal Aunt        paternal aunt  . Lung cancer Maternal Aunt   . Heart disease Other        parents/maternal grandparents/ 2 brothers  . Stroke Paternal Grandmother   . Mental retardation Sister   . Colon cancer Neg Hx   . Esophageal cancer Neg Hx   . Pancreatic cancer Neg Hx   . Stomach cancer Neg Hx   . Liver disease Neg Hx   . Colon polyps Neg Hx   . Rectal cancer Neg Hx      ROS:  Please see the history of present illness.  General:no colds or fevers, no weight changes Skin:no rashes or ulcers HEENT:no blurred vision, no congestion CV:see HPI PUL:see HPI GI:no diarrhea constipation or melena, no indigestion GU:no hematuria, no dysuria MS:no joint pain, no claudication Neuro:no syncope, no lightheadedness Endo:+ diabetes- pretty well controlled, no thyroid disease  All other ROS reviewed and negative.     Physical Exam/Data:   Vitals:   05/28/20 0432 05/28/20 0600 05/28/20 0803 05/28/20 1121  BP: 135/67 (!) 168/70 (!) 155/68 (!) 159/75  Pulse: 63 60 (!) 57 60  Resp: 12 14 14 13   Temp: 97.8 F (36.6 C)   97.9 F (36.6 C)  TempSrc: Oral   Oral  SpO2: 99%  98% 97% 98%  Weight:      Height:        Intake/Output Summary (Last 24 hours) at 05/28/2020 1125 Last data filed at 05/28/2020 1100 Gross per 24 hour  Intake 241.05 ml  Output 1325 ml  Net -1083.95 ml   Last 3 Weights 05/27/2020 05/27/2020 05/22/2020  Weight (lbs) 136 lb 14.5 oz 134 lb 134 lb  Weight (kg) 62.1 kg 60.782 kg 60.782 kg     Body mass index is 24.25 kg/m.  General:  Well nourished, well developed, in no acute distress HEENT: normal Lymph: no adenopathy Neck: no JVD Endocrine:  No  thryomegaly Vascular: No carotid bruits; peal pulses 2+ bilaterally  Cardiac:  normal S1, S2; RRR; 1-9/1 systolic murmur no gallup or rub Lungs:  clear to auscultation bilaterally, no wheezing, rhonchi or rales  Abd: soft, nontender, no hepatomegaly  Ext: no edema Musculoskeletal:  No deformities, BUE and BLE strength normal and equal Skin: warm and dry  Neuro:  CNs 2-12 intact, no focal abnormalities noted Psych:  Normal affect    Relevant CV Studies: Echo 01/20/20  IMPRESSIONS    1. Left ventricular ejection fraction, by estimation, is 60 to 65%. The  left ventricle has normal function. The left ventricle has no regional  wall motion abnormalities. There is moderate concentric left ventricular  hypertrophy. Left ventricular  diastolic parameters are consistent with Grade I diastolic dysfunction  (impaired relaxation).  2. Right ventricular systolic function is normal. The right ventricular  size is mildly enlarged. There is normal pulmonary artery systolic  pressure.  3. Left atrial size was mildly dilated.  4. The mitral valve is normal in structure. Mild mitral valve  regurgitation. No evidence of mitral stenosis.  5. The aortic valve is normal in structure. Aortic valve regurgitation is  mild. Mild aortic valve stenosis. Aortic valve mean gradient measures 13.0  mmHg.  6. The inferior vena cava is normal in size with greater than 50%  respiratory variability,  suggesting right atrial pressure of 3 mmHg.   FINDINGS  Left Ventricle: Left ventricular ejection fraction, by estimation, is 60  to 65%. The left ventricle has normal function. The left ventricle has no  regional wall motion abnormalities. The left ventricular internal cavity  size was normal in size. There is  moderate concentric left ventricular hypertrophy. Left ventricular  diastolic parameters are consistent with Grade I diastolic dysfunction  (impaired relaxation). Normal left ventricular filling pressure.   Right Ventricle: The right ventricular size is mildly enlarged. No  increase in right ventricular wall thickness. Right ventricular systolic  function is normal. There is normal pulmonary artery systolic pressure.  The tricuspid regurgitant velocity is 2.34  m/s, and with an assumed right atrial pressure of 3 mmHg, the estimated  right ventricular systolic pressure is 47.8 mmHg.   Left Atrium: Left atrial size was mildly dilated.   Right Atrium: Right atrial size was normal in size.   Pericardium: There is no evidence of pericardial effusion.   Mitral Valve: The mitral valve is normal in structure. There is mild  thickening of the mitral valve leaflet(s). There is moderate calcification  of the mitral valve leaflet(s). Normal mobility of the mitral valve  leaflets. Moderate mitral annular  calcification. Mild mitral valve regurgitation. No evidence of mitral  valve stenosis.   Tricuspid Valve: The tricuspid valve is normal in structure. Tricuspid  valve regurgitation is mild . No evidence of tricuspid stenosis.   Aortic Valve: The aortic valve is normal in structure.. There is severe  thickening and severe calcifcation of the aortic valve. Aortic valve  regurgitation is mild. Aortic regurgitation PHT measures 593 msec. Mild  aortic stenosis is present. There is  severe thickening of the aortic valve. There is severe calcifcation of the  aortic valve. Aortic valve  mean gradient measures 13.0 mmHg. Aortic valve  peak gradient measures 26.1 mmHg. Aortic valve area, by VTI measures 1.18  cm.   Pulmonic Valve: The pulmonic valve was normal in structure. Pulmonic valve  regurgitation is trivial. No evidence of pulmonic stenosis.   Aorta: The aortic root is normal in size and  structure.   Venous: The inferior vena cava is normal in size with greater than 50%  respiratory variability, suggesting right atrial pressure of 3 mmHg.   IAS/Shunts: No atrial level shunt detected by color flow Doppler.  Intervention     Echo pending  CATH AND PCI 10/2017     Mid LAD lesion is 80% stenosed. LIMA to LAD is patent.  Prox RCA to Mid RCA lesion is 100% stenosed. SVG to PDA is occluded.  Ost Cx to Prox Cx lesion is 100% stenosed.  Ost 2nd Diag to 2nd Diag lesion is 75% stenosed.  A drug-eluting stent was successfully placed using a STENT SYNERGY DES 2.5X20.  Post intervention, there is a 0% residual stenosis.  Mid Graft lesion is 80% stenosed.  A drug-eluting stent was successfully placed using a STENT SYNERGY DES 3X16.  Post intervention, there is a 0% residual stenosis.  Previously placed Prox Graft stent (unknown type) before Ost 2nd Mrg is widely patent. Origin lesion before stent to Ost 2nd Mrg is 90% stenosed.  A drug-eluting stent was successfully placed using a STENT SYNERGY DES 3.5X20, postdilated to > 4 mm.  Post intervention, there is a 0% residual stenosis.  Second part of this jump graft which previously was cballooned in 4/18, is now occluded. Left to left collaterals fill the distal circumflex.  LV end diastolic pressure is low.  There is no aortic valve stenosis.   Successful PCI of SVG to diagonal.  Successful PCI to the proximal graft, SVG to OM jump graft.  Second part of this jump graft which previously was ballooned in 4/18, is now occluded.  Left to left collaterals fill the distal circumflex.  Continue DAPT for one year.     Laboratory Data:  High Sensitivity Troponin:   Recent Labs  Lab 05/27/20 1830 05/27/20 2042 05/28/20 0222  TROPONINIHS 154* 188* 1,001*     Chemistry Recent Labs  Lab 05/27/20 1830 05/28/20 0222  NA 137 139  K 4.2 3.8  CL 106 108  CO2 23 22  GLUCOSE 212* 236*  BUN 21 21  CREATININE 1.71* 1.56*  CALCIUM 8.8* 8.8*  GFRNONAA 42* 47*  ANIONGAP 8 9    Recent Labs  Lab 05/28/20 0222  PROT 6.1*  ALBUMIN 3.2*  AST 30  ALT 31  ALKPHOS 45  BILITOT 0.7   Hematology Recent Labs  Lab 05/27/20 1830 05/28/20 0222  WBC 5.0 6.7  RBC 3.96* 3.86*  HGB 12.7* 12.4*  HCT 38.6* 36.7*  MCV 97.5 95.1  MCH 32.1 32.1  MCHC 32.9 33.8  RDW 13.1 13.0  PLT 182 169   BNPNo results for input(s): BNP, PROBNP in the last 168 hours.  DDimer No results for input(s): DDIMER in the last 168 hours.   Radiology/Studies:  DG Chest 2 View  Result Date: 05/27/2020 CLINICAL DATA:  Chest pain. EXAM: CHEST - 2 VIEW COMPARISON:  Multiple priors, most recent October 20, 2017 FINDINGS: Similar cardiomediastinal silhouette. Postsurgical changes of CABG and median sternotomy. Linear and hazy left basilar opacities, similar over multiple priors and likely related to atelectasis or scar. No new focal consolidation. No pleural effusions or pneumothorax. No acute osseous abnormality. IMPRESSION: Left basilar atelectasis/scar without superimposed acute cardiopulmonary disease. Electronically Signed   By: Margaretha Sheffield MD   On: 05/27/2020 19:13     Assessment and Plan:   1. NSTEMI in pt with known CAD and hx of CABG and multiple PCIs last 2019 with PCI to SVG to diag,  PCI to proximal graft, VG on Om Jump graft.   Currently  on IV heparin, IV NTG and BB, ASA-325 mg , statin, home meds imdur, lopressor 12.5 BID lasix on hold and ACE  Most likely cath today.  Pt is NPO  2. CAD with prior CABG and stents. Now on ASA alone 3. Mild AS on last echo 4. HTN controlled 5. HLD on statin  6. CKD-3  Average  1.77 looking back   7. DM-2 per IM  8. HIV per IM  The patient understands that risks included but are not limited to stroke (1 in 1000), death (1 in 58), kidney failure [usually temporary] (1 in 500), bleeding (1 in 200), allergic reaction [possibly serious] (1 in 200).        TIMI Risk Score for Unstable Angina or Non-ST Elevation MI:   The patient's TIMI risk score is 6, which indicates a 41% risk of all cause mortality, new or recurrent myocardial infarction or need for urgent revascularization in the next 14 days.           For questions or updates, please contact [place] Please consult [email or web address] for contact info under    Signed, Cecilie Kicks, NP  05/28/2020 11:25 AM

## 2020-05-28 NOTE — Progress Notes (Signed)
ANTICOAGULATION CONSULT NOTE  Pharmacy Consult for Heparin Indication: chest pain/ACS  No Known Allergies  Patient Measurements: Height: 5\' 3"  (160 cm) Weight: 62.1 kg (136 lb 14.5 oz) IBW/kg (Calculated) : 56.9 Heparin Dosing Weight: 60.8 kg  Vital Signs: Temp: 97.8 F (36.6 C) (10/25 0432) Temp Source: Oral (10/25 0432) BP: 135/67 (10/25 0432) Pulse Rate: 63 (10/25 0432)  Labs: Recent Labs    05/27/20 1830 05/27/20 2042 05/28/20 0222  HGB 12.7*  --  12.4*  HCT 38.6*  --  36.7*  PLT 182  --  169  HEPARINUNFRC  --   --  0.24*  CREATININE 1.71*  --  1.56*  TROPONINIHS 154* 188* 1,001*    Estimated Creatinine Clearance: 34.4 mL/min (A) (by C-G formula based on SCr of 1.56 mg/dL (H)).   Medical History: Past Medical History:  Diagnosis Date  . Absolute anemia 05/28/2014  . Aortic stenosis 09/17/2015  . Arthritis   . Ascites 11/18/2013  . BELLS PALSY 07/19/2010   Qualifier: Diagnosis of  By: [name], [name]    . CAD (coronary artery disease)    a. s/p CABG in 2003 b. s/p PCI to SVG-PDA in 07/2015 c. 11/2016: cath showing severe native CAD with patent LIMA-LAD and SVG-D1 with 80% stenosis of SVG-OM1-OM2. Initially medical management was recommended --> presented with recurrent angina --> s/p Synergy DES to proximal body of SVG-OM1-OM2, POBA to distal graft.   Marland Kitchen CAD- S/P PCI SVG-OM1 12/01/16 05/12/2006   Qualifier: Diagnosis of  By: [name], [name]    . Carotid bruit 07/10/2015  . Chest pain 11/22/2016  . Chronic kidney disease   . Chronic renal insufficiency, stage III (moderate) ([place]) 12/02/2016  . Constipation 05/28/2014  . COPD 07/20/2008   Qualifier: Diagnosis of  By: [name], [name]   . DEPRESSION 09/03/2006   Qualifier: Diagnosis of  By: [name], [name]    . Dermatitis 11/27/2012  . Diabetes mellitus   . Diarrhea 11/20/2013  . DM (diabetes mellitus), type 2 with ophthalmic complications ([place]) 9/62/9528   Qualifier: Diagnosis of  By: [name], [name]    .  Dry eye syndrome 12/20/2010  . Epicondylitis 06/05/2013   right  . Erectile dysfunction 01/01/2012  . Essential hypertension 05/12/2006   Qualifier: Diagnosis of  By: [name], [name]    . GENITAL HERPES 05/03/2009   Qualifier: Diagnosis of  By: [name], [name]    . GERD 09/03/2006   Qualifier: Diagnosis of  By: [name], [name]    . GERD (gastroesophageal reflux disease)   . HEARING LOSS, SENSORINEURAL 05/12/2006   Qualifier: Diagnosis of  By: [name], [name]    . HEMATOCHEZIA 04/18/2008   Annotation: 9/09 during bout of constipation Qualifier: Diagnosis of  By: [name], [name]    . HIP PAIN, BILATERAL 07/17/2008   Qualifier: Diagnosis of  By: [name], [name]   . History of depression   . History of kidney stones   . HIV (human immunodeficiency virus infection) ([place]) 1991   on meds since initial dx.   Marland Kitchen HLD (hyperlipidemia) 11/19/2013  . Human immunodeficiency virus (HIV) disease ([place]) 05/12/2006   Qualifier: Diagnosis of  By: [name], [name]    . Hx of CABG 09/03/2006   Annotation: 2003 Qualifier: Diagnosis of  By: [name], [name]    . Hyperkalemia 03/23/2014  . Hyperlipidemia   . Hypertension   . Hyponatremia 03/23/2014  . INGUINAL LYMPHADENOPATHY, RIGHT 04/03/2009   Qualifier: Diagnosis of  By: [name], [name]    . Keratoma 01/24/2015  . KNEE PAIN, BILATERAL 07/17/2008   Qualifier: Diagnosis of  By: [name], [name]   . Lesion of breast 07/21/2015  . Lipodystrophy 12/20/2010  . Memory loss 09/18/2008   Qualifier: Diagnosis of  By: [name], [name]   . Metatarsal deformity 01/24/2015  . Nasal abscess 12/04/2015  . Night sweats 07/06/2012  . Nocturia 03/06/2013  . NSTEMI (non-ST elevated myocardial infarction) ([place]) 11/29/2016  . Pain in joint, ankle and foot 01/19/2015  . Pancreatitis 11/2013   attributed to HIV meds.   . Pedal edema 05/21/2014  . PERIPHERAL VASCULAR DISEASE 07/20/2008   Qualifier: Diagnosis of  By: [name], [name]   . Posterior cervical lymphadenopathy  03/30/2014  . Protein-calorie malnutrition, severe ([place]) 03/23/2014  . SHINGLES, HX OF 05/03/2009   Annotation: R leg Qualifier: Diagnosis of  By: [name], [name]    . STEMI (ST elevation myocardial infarction) ([place]) 07/29/2015  . Unstable angina ([place]) 11/24/2016  . WEIGHT LOSS, ABNORMAL 04/03/2009   Qualifier: Diagnosis of  By: [name], [name]      Medications:  Scheduled:  . acidophilus  1 capsule Oral Q breakfast  . aspirin  325 mg Oral Daily  . atorvastatin  40 mg Oral QPM  . dolutegravir  50 mg Oral Daily  . doravirine  100 mg Oral Daily  . fenofibrate  54 mg Oral QHS  . [START ON 05/29/2020] furosemide  20 mg Oral Q T,Th,S,Su  . furosemide  40 mg Oral Q M,W,F  . icosapent Ethyl  2 g Oral BID  . insulin aspart  0-15 Units Subcutaneous TID AC & HS  . lisinopril  5 mg Oral Daily  . metoprolol tartrate  12.5 mg Oral BID  . pantoprazole  40 mg Oral Daily  . sodium bicarbonate  650 mg Oral TID  . tamsulosin  0.4 mg Oral BID  . valACYclovir  500 mg Oral Daily    Assessment: Patient is a 58 yom that presents to the ED with c/o CP. Patient was found to have an elevated trop. At this time pharmacy has been asked to dose heparin for ACS.   10/25 AM update:  Heparin level below goal No issues per RN  Goal of Therapy:  Heparin level 0.3-0.7 units/ml Monitor platelets by anticoagulation protocol: Yes   Plan:  -Inc heparin to 850 units/hr -1300 heparin level  Narda Bonds, PharmD, BCPS Clinical Pharmacist Phone: [phone]

## 2020-05-28 NOTE — Progress Notes (Signed)
Progress Note    [name]  MPN:[id number] DOB: 05/19/1946  DOA: 05/27/2020 PCP: Dorothyann Peng, NP    Brief Narrative:     Medical records reviewed and are as summarized below:  [name] is an 74 y.o. male with past medical history of HIV, coronary artery disease (S/P CABG 2003, PCI of graft to ramus and OM branches 11/2016, PCI in 10/2017 with 3 additional DES), chronic diastolic congestive heart failure (Echo 01/2020 EF 60-65%), mild AS , diabetes mellitus type 2 (last A1C 7.2 05/2020), HIV, hyperlipidemia and COPD who presents to [facility] emergency department with complaints of chest discomfort.  Patient explains that earlier in the afternoon on 10/24 he was driving in his car to go pick up some friends when he suddenly began to experience chest discomfort.  This chest pain was sharp in quality, midsternal in location, nonradiating and moderate to severe in intensity.  Discomfort lasted for several hours, associated with shortness of breath and diaphoresis.  Patient took 2 sublingual nitroglycerin in route to the emergency department with some improvement in symptoms.   Assessment/Plan:   Principal Problem:   NSTEMI (non-ST elevated myocardial infarction) ([place]) Active Problems:   Human immunodeficiency virus (HIV) disease ([place])   Essential hypertension   GERD without esophagitis   Mixed hyperlipidemia   Chronic kidney disease, stage 3b ([place])   Coronary artery disease   Type 2 diabetes mellitus with stage 3b chronic kidney disease, without long-term current use of insulin (HCC)   Benign prostatic hyperplasia without lower urinary tract symptoms   Chronic diastolic CHF (congestive heart failure) (HCC)   NSTEMI (non-ST elevated myocardial infarction) ([place]) -Patient has known history of extensive coronary artery disease status post CABG and multiple interventions including placement of 3 drug-eluting stents in March 2019 -patient reports symptomatic  improvement with administration of nitroglycerin -elevated troponin - nitroglycerin infusion -heparin gtt -Aspirin 325 mg has been administered.  Metoprolol and statin have also been ordered. -NPO -cardiology consult for recommendations  Coronary artery disease -Complicated history of coronary artery disease status post coronary artery bypass graft surgery in 2003 and multiple cardiac catheterizations since -Most recent intervention was PCI 10/2017 for NSTEMI with placement of 3 drug-eluting stents -Patient follows with Dr. Stanford Breed with cardiology     Human immunodeficiency virus (HIV) disease ([place]) -Continue home regimen of HAART therapy -Patient follows with Dr. Megan Salon infectious disease -Per most recent note, patient is disease is well controlled    Essential hypertension -Continue home regimen of antihypertensive therapy    Chronic kidney disease, stage 3b ([place]) -Strict intake and output monitoring -Creatinine near baseline -hold ACE and lasix for now until seen by cardiology    Type 2 diabetes mellitus with stage 3b chronic kidney disease, without long-term current use of insulin (HCC) -SSI -holding PO meds    Chronic diastolic CHF (congestive heart failure) ([place]) -No clinical evidence of cardiogenic volume overload at this time.    GERD without esophagitis -Continue home regimen of PPI    Mixed hyperlipidemia -Continue home regimen of lipid-lowering therapy    Benign prostatic hyperplasia without lower urinary tract symptoms  -Continue home regimen of Flomax   Family Communication/Anticipated D/C date and plan/Code Status   DVT prophylaxis: heparin gtt Code Status: Full Code.  Disposition Plan: Status is: Observation  The patient will require care spanning > 2 midnights and should be moved to inpatient because: IV treatments appropriate due to intensity of illness or inability to take PO  Dispo: The patient is from: Home               Anticipated d/c is to: Home              Anticipated d/c date is: 3 days              Patient currently is not medically stable to d/c. heparin and nitro gtt/cardiology eval         Medical Consultants:    cards     Subjective:   Still with some lingering chest discomfort  Objective:    Vitals:   05/28/20 0200 05/28/20 0432 05/28/20 0600 05/28/20 0803  BP: (!) 159/86 135/67 (!) 168/70 (!) 155/68  Pulse: 71 63 60 (!) 57  Resp: 16 12 14 14   Temp:  97.8 F (36.6 C)    TempSrc:  Oral    SpO2: 98% 99% 98% 97%  Weight:      Height:        Intake/Output Summary (Last 24 hours) at 05/28/2020 0857 Last data filed at 05/28/2020 0804 Gross per 24 hour  Intake 241.05 ml  Output 925 ml  Net -683.95 ml   Filed Weights   05/27/20 1813 05/27/20 2223  Weight: 60.8 kg 62.1 kg    Exam:  General: Appearance:    Well developed, well nourished male in no acute distress     Lungs:      respirations unlabored  Heart:    Bradycardic. Normal rhythm.  +murmur  MS:   All extremities are intact.   Neurologic:   Awake, alert, oriented x 3. No apparent focal neurological           defect.     Data Reviewed:   I have personally reviewed following labs and imaging studies:  Labs: Labs show the following:   Basic Metabolic Panel: Recent Labs  Lab 05/27/20 1830 05/28/20 0222  NA 137 139  K 4.2 3.8  CL 106 108  CO2 23 22  GLUCOSE 212* 236*  BUN 21 21  CREATININE 1.71* 1.56*  CALCIUM 8.8* 8.8*  MG  --  2.0   GFR Estimated Creatinine Clearance: 34.4 mL/min (A) (by C-G formula based on SCr of 1.56 mg/dL (H)). Liver Function Tests: Recent Labs  Lab 05/28/20 0222  AST 30  ALT 31  ALKPHOS 45  BILITOT 0.7  PROT 6.1*  ALBUMIN 3.2*   No results for input(s): LIPASE, AMYLASE in the last 168 hours. No results for input(s): AMMONIA in the last 168 hours. Coagulation profile No results for input(s): INR, PROTIME in the last 168 hours.  CBC: Recent Labs  Lab  05/27/20 1830 05/28/20 0222  WBC 5.0 6.7  NEUTROABS  --  5.1  HGB 12.7* 12.4*  HCT 38.6* 36.7*  MCV 97.5 95.1  PLT 182 169   Cardiac Enzymes: No results for input(s): CKTOTAL, CKMB, CKMBINDEX, TROPONINI in the last 168 hours. BNP (last 3 results) No results for input(s): PROBNP in the last 8760 hours. CBG: Recent Labs  Lab 05/27/20 2130 05/28/20 0554  GLUCAP 168* 158*   D-Dimer: No results for input(s): DDIMER in the last 72 hours. Hgb A1c: No results for input(s): HGBA1C in the last 72 hours. Lipid Profile: No results for input(s): CHOL, HDL, LDLCALC, TRIG, CHOLHDL, LDLDIRECT in the last 72 hours. Thyroid function studies: No results for input(s): TSH, T4TOTAL, T3FREE, THYROIDAB in the last 72 hours.  Invalid input(s): FREET3 Anemia work up: No results for input(s): VITAMINB12, FOLATE, FERRITIN,  TIBC, IRON, RETICCTPCT in the last 72 hours. Sepsis Labs: Recent Labs  Lab 05/27/20 1830 05/28/20 0222  WBC 5.0 6.7    Microbiology Recent Results (from the past 240 hour(s))  Respiratory Panel by RT PCR (Flu A&B, Covid) - Nasopharyngeal Swab     Status: None   Collection Time: 05/27/20  8:42 PM   Specimen: Nasopharyngeal Swab  Result Value Ref Range Status   SARS Coronavirus 2 by RT PCR NEGATIVE NEGATIVE Final    Comment: (NOTE) SARS-CoV-2 target nucleic acids are NOT DETECTED.  The SARS-CoV-2 RNA is generally detectable in upper respiratoy specimens during the acute phase of infection. The lowest concentration of SARS-CoV-2 viral copies this assay can detect is 131 copies/mL. A negative result does not preclude SARS-Cov-2 infection and should not be used as the sole basis for treatment or other patient management decisions. A negative result may occur with  improper specimen collection/handling, submission of specimen other than nasopharyngeal swab, presence of viral mutation(s) within the areas targeted by this assay, and inadequate number of viral copies (<131  copies/mL). A negative result must be combined with clinical observations, patient history, and epidemiological information. The expected result is Negative.  Fact Sheet for Patients:  PinkCheek.be  Fact Sheet for Healthcare Providers:  GravelBags.it  This test is no t yet approved or cleared by the [place] FDA and  has been authorized for detection and/or diagnosis of SARS-CoV-2 by FDA under an Emergency Use Authorization (EUA). This EUA will remain  in effect (meaning this test can be used) for the duration of the COVID-19 declaration under Section 564(b)(1) of the Act, 21 U.S.C. section 360bbb-3(b)(1), unless the authorization is terminated or revoked sooner.     Influenza A by PCR NEGATIVE NEGATIVE Final   Influenza B by PCR NEGATIVE NEGATIVE Final    Comment: (NOTE) The Xpert Xpress SARS-CoV-2/FLU/RSV assay is intended as an aid in  the diagnosis of influenza from Nasopharyngeal swab specimens and  should not be used as a sole basis for treatment. Nasal washings and  aspirates are unacceptable for Xpert Xpress SARS-CoV-2/FLU/RSV  testing.  Fact Sheet for Patients: PinkCheek.be  Fact Sheet for Healthcare Providers: GravelBags.it  This test is not yet approved or cleared by the [place] FDA and  has been authorized for detection and/or diagnosis of SARS-CoV-2 by  FDA under an Emergency Use Authorization (EUA). This EUA will remain  in effect (meaning this test can be used) for the duration of the  Covid-19 declaration under Section 564(b)(1) of the Act, 21  U.S.C. section 360bbb-3(b)(1), unless the authorization is  terminated or revoked. Performed at [facility], [place] [address], [place], [place] [address]   MRSA PCR Screening     Status: None   Collection Time: 05/27/20 10:39 PM   Specimen: Nasopharyngeal  Result Value Ref Range Status     MRSA by PCR NEGATIVE NEGATIVE Final    Comment:        The GeneXpert MRSA Assay (FDA approved for NASAL specimens only), is one component of a comprehensive MRSA colonization surveillance program. It is not intended to diagnose MRSA infection nor to guide or monitor treatment for MRSA infections. Performed at [facility], [place] [address] [place], [place] [address]     Procedures and diagnostic studies:  DG Chest 2 View  Result Date: 05/27/2020 CLINICAL DATA:  Chest pain. EXAM: CHEST - 2 VIEW COMPARISON:  Multiple priors, most recent October 20, 2017 FINDINGS: Similar cardiomediastinal silhouette.  Postsurgical changes of CABG and median sternotomy. Linear and hazy left basilar opacities, similar over multiple priors and likely related to atelectasis or scar. No new focal consolidation. No pleural effusions or pneumothorax. No acute osseous abnormality. IMPRESSION: Left basilar atelectasis/scar without superimposed acute cardiopulmonary disease. Electronically Signed   By: Margaretha Sheffield MD   On: 05/27/2020 19:13    Medications:   . acidophilus  1 capsule Oral Q breakfast  . aspirin  325 mg Oral Daily  . atorvastatin  40 mg Oral QPM  . dolutegravir  50 mg Oral Daily  . doravirine  100 mg Oral Daily  . fenofibrate  54 mg Oral QHS  . [START ON 05/29/2020] furosemide  20 mg Oral Q T,Th,S,Su  . furosemide  40 mg Oral Q M,W,F  . icosapent Ethyl  2 g Oral BID  . insulin aspart  0-15 Units Subcutaneous TID AC & HS  . lisinopril  5 mg Oral Daily  . metoprolol tartrate  12.5 mg Oral BID  . pantoprazole  40 mg Oral Daily  . sodium bicarbonate  650 mg Oral TID  . tamsulosin  0.4 mg Oral BID  . valACYclovir  500 mg Oral Daily   Continuous Infusions: . heparin 850 Units/hr (05/28/20 0549)  . nitroGLYCERIN 10 mcg/min (05/28/20 0615)     LOS: 0 days   Geradine Girt  [facility]   How to contact the [facility] Attending or Consulting provider [place] or covering  provider during after hours [place], for this patient?  1. Check the care team in [facility] and look for a) attending/consulting TR[facility] provider listed and b) the [facility] team listed 2. Log into [email or web address] and use [facility]'s universal password to access. If you do not have the password, please contact the hospital operator. 3. Locate the [facility] provider you are looking for under [facility] and page to a number that you can be directly reached. 4. If you still have difficulty reaching the provider, please page the [facility] (Director on Call) for the Hospitalists listed on amion for assistance.  05/28/2020, 8:57 AM

## 2020-05-28 NOTE — Interval H&P Note (Signed)
History and Physical Interval Note:  05/28/2020 2:26 PM  [name]  has presented today for surgery, with the diagnosis of NSTEMI.  The various methods of treatment have been discussed with the patient and family. After consideration of risks, benefits and other options for treatment, the patient has consented to  Procedure(s): LEFT HEART CATH AND CORS/GRAFTS ANGIOGRAPHY (N/A) as a surgical intervention.  The patient's history has been reviewed, patient examined, no change in status, stable for surgery.  I have reviewed the patient's chart and labs.  Questions were answered to the patient's satisfaction.    Cath Lab Visit (complete for each Cath Lab visit)  Clinical Evaluation Leading to the Procedure:   ACS: Yes.    Non-ACS:  N/A  [name]

## 2020-05-29 ENCOUNTER — Telehealth: Payer: Self-pay | Admitting: Adult Health

## 2020-05-29 ENCOUNTER — Encounter (HOSPITAL_COMMUNITY): Payer: Self-pay | Admitting: Internal Medicine

## 2020-05-29 ENCOUNTER — Other Ambulatory Visit (HOSPITAL_COMMUNITY): Payer: Self-pay | Admitting: Internal Medicine

## 2020-05-29 DIAGNOSIS — N1832 Chronic kidney disease, stage 3b: Secondary | ICD-10-CM | POA: Diagnosis not present

## 2020-05-29 DIAGNOSIS — I214 Non-ST elevation (NSTEMI) myocardial infarction: Secondary | ICD-10-CM | POA: Diagnosis not present

## 2020-05-29 DIAGNOSIS — I5032 Chronic diastolic (congestive) heart failure: Secondary | ICD-10-CM | POA: Diagnosis not present

## 2020-05-29 DIAGNOSIS — B20 Human immunodeficiency virus [HIV] disease: Secondary | ICD-10-CM | POA: Diagnosis not present

## 2020-05-29 LAB — CBC
HCT: 39.4 % (ref 39.0–52.0)
Hemoglobin: 13.4 g/dL (ref 13.0–17.0)
MCH: 32.1 pg (ref 26.0–34.0)
MCHC: 34 g/dL (ref 30.0–36.0)
MCV: 94.5 fL (ref 80.0–100.0)
Platelets: 142 10*3/uL — ABNORMAL LOW (ref 150–400)
RBC: 4.17 MIL/uL — ABNORMAL LOW (ref 4.22–5.81)
RDW: 13 % (ref 11.5–15.5)
WBC: 5.9 10*3/uL (ref 4.0–10.5)
nRBC: 0 % (ref 0.0–0.2)

## 2020-05-29 LAB — GLUCOSE, CAPILLARY: Glucose-Capillary: 111 mg/dL — ABNORMAL HIGH (ref 70–99)

## 2020-05-29 LAB — BASIC METABOLIC PANEL
Anion gap: 6 (ref 5–15)
BUN: 19 mg/dL (ref 8–23)
CO2: 23 mmol/L (ref 22–32)
Calcium: 8.8 mg/dL — ABNORMAL LOW (ref 8.9–10.3)
Chloride: 110 mmol/L (ref 98–111)
Creatinine, Ser: 1.58 mg/dL — ABNORMAL HIGH (ref 0.61–1.24)
GFR, Estimated: 46 mL/min — ABNORMAL LOW (ref >60)
Glucose, Bld: 106 mg/dL — ABNORMAL HIGH (ref 70–99)
Potassium: 3.9 mmol/L (ref 3.5–5.1)
Sodium: 139 mmol/L (ref 135–145)

## 2020-05-29 MED ORDER — TICAGRELOR 90 MG PO TABS
90.0000 mg | ORAL_TABLET | Freq: Two times a day (BID) | ORAL | 0 refills | Status: DC
Start: 2020-05-29 — End: 2020-05-29

## 2020-05-29 MED ORDER — FUROSEMIDE 20 MG PO TABS: 20.0000 mg | ORAL_TABLET | Freq: Every day | ORAL | 1 refills | Status: AC

## 2020-05-29 MED ORDER — LOSARTAN POTASSIUM 25 MG PO TABS: 25.0000 mg | ORAL_TABLET | Freq: Every day | ORAL | 1 refills | Status: AC

## 2020-05-29 MED ORDER — METOPROLOL TARTRATE 25 MG PO TABS: 12.5000 mg | ORAL_TABLET | Freq: Two times a day (BID) | ORAL | 0 refills | Status: AC

## 2020-05-29 MED ORDER — ATORVASTATIN CALCIUM 40 MG PO TABS: 40.0000 mg | ORAL_TABLET | Freq: Every evening | ORAL | 0 refills | Status: AC

## 2020-05-29 MED ORDER — POTASSIUM CHLORIDE CRYS ER 20 MEQ PO TBCR: EXTENDED_RELEASE_TABLET | ORAL | 1 refills | Status: AC

## 2020-05-29 MED ORDER — LOSARTAN POTASSIUM 25 MG PO TABS
25.0000 mg | ORAL_TABLET | Freq: Every day | ORAL | Status: DC
  Administered 2020-05-29: 25 mg via ORAL
  Filled 2020-05-29: qty 1

## 2020-05-29 MED FILL — BRILINTA 90 MG TABLET: 90 | 30 days supply | Qty: 60 | Fill #0

## 2020-05-29 MED FILL — Nitroglycerin IV Soln 100 MCG/ML in D5W: INTRA_ARTERIAL | Qty: 10 | Status: AC

## 2020-05-29 NOTE — Discharge Summary (Signed)
Physician Discharge Summary  [name] GEZ:[id number] DOB: Dec 01, 1945 DOA: 05/27/2020  PCP: Dorothyann Peng, NP  Admit date: 05/27/2020 Discharge date: 05/29/2020  Admitted From: home Discharge disposition: home   Recommendations for Outpatient Follow-Up:   1. Cardiology follow up 2. Lasix changed to 20 mg daily until hospital follow up per cards 3. BMP 1 week   Discharge Diagnosis:   Principal Problem:   NSTEMI (non-ST elevated myocardial infarction) ([place]) Active Problems:   Human immunodeficiency virus (HIV) disease ([place])   Essential hypertension   GERD without esophagitis   Mixed hyperlipidemia   Chronic kidney disease, stage 3b ([place])   Coronary artery disease   Type 2 diabetes mellitus with stage 3b chronic kidney disease, without long-term current use of insulin (HCC)   Benign prostatic hyperplasia without lower urinary tract symptoms   Chronic diastolic CHF (congestive heart failure) ([place])    Discharge Condition: Improved.  Diet recommendation: Low sodium, heart healthy.  Carbohydrate-modified.   Wound care: None.  Code status: Full.   History of Present Illness:   74 year old male with past medical history of HIV, coronary artery disease (S/P CABG 2003, PCI of graft to ramus and OM branches 11/2016, PCI in 10/2017 with 3 additional DES), chronic diastolic congestive heart failure (Echo 01/2020 EF 60-65%), mild AS , diabetes mellitus type 2 (last A1C 7.2 05/2020), HIV, hyperlipidemia and COPD who presents to [name] emergency department with complaints of chest discomfort.  Patient explains that earlier in the afternoon on 10/24 he was driving in his car to go pick up some friends when he suddenly began to experience chest discomfort.  This chest pain was sharp in quality, midsternal in location, nonradiating and moderate to severe in intensity.  Discomfort lasted for several hours, associated with shortness of breath and diaphoresis.   Patient took 2 sublingual nitroglycerin in route to the emergency department with some improvement in symptoms.  Upon arrival to the emergency department, patient was found to continue to experience chest discomfort and therefore 3 additional doses of sublingual nitroglycerin were administered throughout his course in the emergency department each resulting in some improvement in his discomfort.  Initial emergency department work-up revealed a somewhat elevated troponin of 154.  EKG revealed no dynamic ST segment changes.  Chest x-ray was unremarkable.  Case was discussed with Dr. Golden Hurter with cardiology who recommended medicine admit patient for ACS work-up.  Heparin drip was initiated.  The hospitalist group was then called to assess the patient for admission to the hospital.     Hospital Course by Problem:   NSTEMI (non-ST elevated myocardial infarction) [facility]) -Patient has known history of extensive coronary artery disease status post CABG and multiple interventions including placement of 3 drug-eluting stents in March 2019 -cards consult: No further chest pain after PCI yesterday - creatinine stable. No ischemic EKG changes. Current meds include asa 81 mg daily, Brilinta 90 mg BID, atorvastatin 40 mg daily, low dose fenofibrate 54 mg, Vascepa 2G BID, lopressor 12.5 mg BID. Recommend starting low dose ARB - losartan 25 mg daily (given hypertension and newly reduced LVEF).  continue imdur and change lasix to 20 mg daily  Coronary artery disease -Complicated history of coronary artery disease status post coronary artery bypass graft surgery in 2003and multiple cardiac catheterizations since -Most recent intervention was PCI 10/2017 for NSTEMI with placement of 3 drug-eluting stents -Patient follows with Dr. Stanford Breed with cardiology  Human immunodeficiency virus (HIV) disease ([place]) -Continue home regimen  ofHAART therapy -Patient follows with Dr. Megan Salon infectious disease -Per most  recent note, patient's disease is well controlled  Essential hypertension -see above meds  Chronic kidney disease, stage 3b ([place]) -Strict intake and output monitoring -Creatinine near baseline -outpatient follow up  Type 2 diabetes mellitus with stage 3b chronic kidney disease, without long-term current use of insulin ([place]) -resume home meds  Chronic diastolic CHF (congestive heart failure) ([place]) -No clinical evidence of cardiogenic volume overload at this time.  GERD without esophagitis -Continue home regimen of PPI  Mixed hyperlipidemia -Continue home regimen of lipid-lowering therapy  Benign prostatic hyperplasia without lower urinary tract symptoms -Continue home regimen of Flomax      Medical Consultants:    cards  Discharge Exam:   Vitals:   05/29/20 0800 05/29/20 0940  BP: (!) 154/66 (!) 164/75  Pulse: 63   Resp: 14 13  Temp: 97.8 F (36.6 C)   SpO2: 97%    Vitals:   05/28/20 2333 05/29/20 0349 05/29/20 0800 05/29/20 0940  BP: 136/72 134/64 (!) 154/66 (!) 164/75  Pulse: (!) 59 (!) 55 63   Resp: 13 17 14 13   Temp: 97.9 F (36.6 C) 97.8 F (36.6 C) 97.8 F (36.6 C)   TempSrc: Oral Oral Oral   SpO2: 97% 99% 97%   Weight:      Height:        General exam: Appears calm and comfortable.   The results of significant diagnostics from this hospitalization (including imaging, microbiology, ancillary and laboratory) are listed below for reference.     Procedures and Diagnostic Studies:   DG Chest 2 View  Result Date: 05/27/2020 CLINICAL DATA:  Chest pain. EXAM: CHEST - 2 VIEW COMPARISON:  Multiple priors, most recent October 20, 2017 FINDINGS: Similar cardiomediastinal silhouette. Postsurgical changes of CABG and median sternotomy. Linear and hazy left basilar opacities, similar over multiple priors and likely related to atelectasis or scar. No new focal consolidation. No pleural effusions or pneumothorax. No acute osseous  abnormality. IMPRESSION: Left basilar atelectasis/scar without superimposed acute cardiopulmonary disease. Electronically Signed   By: Margaretha Sheffield MD   On: 05/27/2020 19:13   CARDIAC CATHETERIZATION  Result Date: 05/28/2020 Conclusions: 1. Severe native coronary artery disease, as detailed below. 2. Widely patent LIMA to diagonal/LAD. 3. Patent SVG to diagonal with 90% in-stent restenosis involving the distal anastomosis. 4. Patent SVG to ramus intermedius with chronic total occlusion of jump portion to OM1.  There is 30 to 40% in-stent restenosis at the ostium of the SVG to ramus intermedius. 5. Chronic total occlusion of sequential SVG to RPDA and RPL. 6. Normal left ventricular filling pressure. 7. Successful PTCA to distal SVG to diagonal in-stent restenosis with 0% residual stenosis and TIMI-3 flow. Recommendations: 1. Dual antiplatelet therapy with aspirin and ticagrelor for at least 12 months. 2. Aggressive secondary prevention. 3. Gentle post catheterization hydration given chronic kidney disease. 4. Remove right femoral artery sheath 2 hours after discontinuation of bivalirudin. Nelva Bush, MD [facility]   ECHOCARDIOGRAM COMPLETE  Result Date: 05/28/2020    ECHOCARDIOGRAM REPORT   Patient Name:   RAFIK KOPPEL Picchi Date of Exam: 05/28/2020 Medical Rec #:  [id number]    Height:       63.0 in Accession #:    [id number]   Weight:       136.9 lb Date of Birth:  12/03/1945   BSA:          1.646 m Patient Age:    16  years     BP:           155/68 mmHg Patient Gender: M            HR:           57 bpm. Exam Location:  Inpatient Procedure: 2D Echo, Cardiac Doppler, Color Doppler and Intracardiac            Opacification Agent Indications:    NSTEMI  History:        Patient has prior history of Echocardiogram examinations, most                 recent 01/20/2020. CHF, CAD, Prior CABG, COPD, Aortic Valve                 Disease and mild AS, Signs/Symptoms:Chest Pain; Risk                  Factors:Diabetes, Hypertension and Dyslipidemia.  Sonographer:    Dustin Flock Referring Phys: [id number] [place]  1. Left ventricular ejection fraction, by estimation, is 40 to 45%. The left ventricle has mildly decreased function. The left ventricle demonstrates regional wall motion abnormalities (see scoring diagram/findings for description). There is mild left ventricular hypertrophy. Left ventricular diastolic parameters are consistent with Grade I diastolic dysfunction (impaired relaxation).  2. Right ventricular systolic function is normal. The right ventricular size is mildly enlarged. There is mildly elevated pulmonary artery systolic pressure.  3. Left atrial size was mildly dilated.  4. The mitral valve is grossly normal. No evidence of mitral valve regurgitation. Moderate mitral annular calcification.  5. The aortic valve is tricuspid. There is mild calcification of the aortic valve. Aortic valve regurgitation is mild.  6. The inferior vena cava is dilated in size with <50% respiratory variability, suggesting right atrial pressure of 15 mmHg. Comparison(s): A prior study was performed on 01/20/20. New decrease in LVEF and new WMA's. Primary cardiology team aware. FINDINGS  Left Ventricle: Left ventricular ejection fraction, by estimation, is 40 to 45%. The left ventricle has mildly decreased function. The left ventricle demonstrates regional wall motion abnormalities. Definity contrast agent was given IV to delineate the left ventricular endocardial borders. The left ventricular internal cavity size was normal in size. There is mild left ventricular hypertrophy. Left ventricular diastolic parameters are consistent with Grade I diastolic dysfunction (impaired relaxation).  LV Wall Scoring: The inferior wall, posterior wall, and mid anterolateral segment are hypokinetic. Infero and inferolateral hypokinesis. Right Ventricle: The right ventricular size is mildly enlarged. No increase  in right ventricular wall thickness. Right ventricular systolic function is normal. There is mildly elevated pulmonary artery systolic pressure. The tricuspid regurgitant velocity is 2.51 m/s, and with an assumed right atrial pressure of 15 mmHg, the estimated right ventricular systolic pressure is 97.6 mmHg. Left Atrium: Left atrial size was mildly dilated. Right Atrium: Right atrial size was normal in size. Pericardium: There is no evidence of pericardial effusion. Mitral Valve: The mitral valve is grossly normal. There is mild thickening of the mitral valve leaflet(s). Moderate mitral annular calcification. No evidence of mitral valve regurgitation. Tricuspid Valve: The tricuspid valve is normal in structure. Tricuspid valve regurgitation is not demonstrated. Aortic Valve: The aortic valve is tricuspid. There is mild calcification of the aortic valve. Aortic valve regurgitation is mild. Aortic regurgitation PHT measures 710 msec. Aortic valve mean gradient measures 10.0 mmHg. Aortic valve peak gradient measures 17.5 mmHg. Aortic valve area, by VTI measures 1.05 cm. Pulmonic Valve:  The pulmonic valve was grossly normal. Pulmonic valve regurgitation is not visualized. Aorta: The aortic root and ascending aorta are structurally normal, with no evidence of dilitation. Venous: The inferior vena cava is dilated in size with less than 50% respiratory variability, suggesting right atrial pressure of 15 mmHg. IAS/Shunts: The atrial septum is grossly normal.  LEFT VENTRICLE PLAX 2D LVIDd:         4.10 cm  Diastology LVIDs:         3.10 cm  LV e' medial:    3.48 cm/s LV PW:         1.20 cm  LV E/e' medial:  28.4 LV IVS:        1.30 cm  LV e' lateral:   9.79 cm/s LVOT diam:     1.80 cm  LV E/e' lateral: 10.1 LV SV:         52 LV SV Index:   31 LVOT Area:     2.54 cm  RIGHT VENTRICLE            IVC RV S prime:     5.44 cm/s  IVC diam: 2.30 cm TAPSE (M-mode): 0.9 cm LEFT ATRIUM             Index       RIGHT ATRIUM            Index LA diam:        3.20 cm 1.94 cm/m  RA Area:     18.00 cm LA Vol (A2C):   46.9 ml 28.49 ml/m RA Volume:   41.00 ml  24.91 ml/m LA Vol (A4C):   41.3 ml 25.09 ml/m LA Biplane Vol: 46.8 ml 28.43 ml/m  AORTIC VALVE AV Area (Vmax):    1.03 cm AV Area (Vmean):   1.00 cm AV Area (VTI):     1.05 cm AV Vmax:           209.00 cm/s AV Vmean:          145.000 cm/s AV VTI:            0.493 m AV Peak Grad:      17.5 mmHg AV Mean Grad:      10.0 mmHg LVOT Vmax:         84.20 cm/s LVOT Vmean:        57.100 cm/s LVOT VTI:          0.203 m LVOT/AV VTI ratio: 0.41 AI PHT:            710 msec  AORTA Ao Root diam: 3.00 cm Ao Asc diam:  3.40 cm MITRAL VALVE                TRICUSPID VALVE MV Area (PHT): 3.85 cm     TR Peak grad:   25.2 mmHg MV Decel Time: 197 msec     TR Vmax:        251.00 cm/s MV E velocity: 99.00 cm/s MV A velocity: 107.00 cm/s  SHUNTS MV E/A ratio:  0.93         Systemic VTI:  0.20 m                             Systemic Diam: 1.80 cm Rudean Haskell MD Electronically signed by Rudean Haskell MD Signature Date/Time: 05/28/2020/2:19:54 PM    Final      Labs:   Basic Metabolic Panel: Recent Labs  Lab 05/27/20  1830 05/27/20 1830 05/28/20 0222 05/28/20 1843 05/29/20 0402  NA 137  --  139  --  139  K 4.2   < > 3.8  --  3.9  CL 106  --  108  --  110  CO2 23  --  22  --  23  GLUCOSE 212*  --  236*  --  106*  BUN 21  --  21  --  19  CREATININE 1.71*  --  1.56* 1.51* 1.58*  CALCIUM 8.8*  --  8.8*  --  8.8*  MG  --   --  2.0  --   --    < > = values in this interval not displayed.   GFR Estimated Creatinine Clearance: 34 mL/min (A) (by C-G formula based on SCr of 1.58 mg/dL (H)). Liver Function Tests: Recent Labs  Lab 05/28/20 0222  AST 30  ALT 31  ALKPHOS 45  BILITOT 0.7  PROT 6.1*  ALBUMIN 3.2*   No results for input(s): LIPASE, AMYLASE in the last 168 hours. No results for input(s): AMMONIA in the last 168 hours. Coagulation profile No results for input(s):  INR, PROTIME in the last 168 hours.  CBC: Recent Labs  Lab 05/27/20 1830 05/28/20 0222 05/28/20 1843 05/29/20 0029  WBC 5.0 6.7 5.3 5.9  NEUTROABS  --  5.1  --   --   HGB 12.7* 12.4* 13.5 13.4  HCT 38.6* 36.7* 40.3 39.4  MCV 97.5 95.1 95.0 94.5  PLT 182 169 146* 142*   Cardiac Enzymes: No results for input(s): CKTOTAL, CKMB, CKMBINDEX, TROPONINI in the last 168 hours. BNP: Invalid input(s): POCBNP CBG: Recent Labs  Lab 05/28/20 1714 05/28/20 1747 05/28/20 1838 05/28/20 2139 05/29/20 0657  GLUCAP 67* 84 88 130* 111*   D-Dimer No results for input(s): DDIMER in the last 72 hours. Hgb A1c Recent Labs    05/28/20 0222  HGBA1C 7.2*   Lipid Profile No results for input(s): CHOL, HDL, LDLCALC, TRIG, CHOLHDL, LDLDIRECT in the last 72 hours. Thyroid function studies No results for input(s): TSH, T4TOTAL, T3FREE, THYROIDAB in the last 72 hours.  Invalid input(s): FREET3 Anemia work up No results for input(s): VITAMINB12, FOLATE, FERRITIN, TIBC, IRON, RETICCTPCT in the last 72 hours. Microbiology Recent Results (from the past 240 hour(s))  Respiratory Panel by RT PCR (Flu A&B, Covid) - Nasopharyngeal Swab     Status: None   Collection Time: 05/27/20  8:42 PM   Specimen: Nasopharyngeal Swab  Result Value Ref Range Status   SARS Coronavirus 2 by RT PCR NEGATIVE NEGATIVE Final    Comment: (NOTE) SARS-CoV-2 target nucleic acids are NOT DETECTED.  The SARS-CoV-2 RNA is generally detectable in upper respiratoy specimens during the acute phase of infection. The lowest concentration of SARS-CoV-2 viral copies this assay can detect is 131 copies/mL. A negative result does not preclude SARS-Cov-2 infection and should not be used as the sole basis for treatment or other patient management decisions. A negative result may occur with  improper specimen collection/handling, submission of specimen other than nasopharyngeal swab, presence of viral mutation(s) within the areas  targeted by this assay, and inadequate number of viral copies (<131 copies/mL). A negative result must be combined with clinical observations, patient history, and epidemiological information. The expected result is Negative.  Fact Sheet for Patients:  PinkCheek.be  Fact Sheet for Healthcare Providers:  GravelBags.it  This test is no t yet approved or cleared by the [place] and  has been authorized  for detection and/or diagnosis of SARS-CoV-2 by FDA under an Emergency Use Authorization (EUA). This EUA will remain  in effect (meaning this test can be used) for the duration of the COVID-19 declaration under Section 564(b)(1) of the Act, 21 U.S.C. section 360bbb-3(b)(1), unless the authorization is terminated or revoked sooner.     Influenza A by PCR NEGATIVE NEGATIVE Final   Influenza B by PCR NEGATIVE NEGATIVE Final    Comment: (NOTE) The Xpert Xpress SARS-CoV-2/FLU/RSV assay is intended as an aid in  the diagnosis of influenza from Nasopharyngeal swab specimens and  should not be used as a sole basis for treatment. Nasal washings and  aspirates are unacceptable for Xpert Xpress SARS-CoV-2/FLU/RSV  testing.  Fact Sheet for Patients: PinkCheek.be  Fact Sheet for Healthcare Providers: GravelBags.it  This test is not yet approved or cleared by the [place] FDA and  has been authorized for detection and/or diagnosis of SARS-CoV-2 by  FDA under an Emergency Use Authorization (EUA). This EUA will remain  in effect (meaning this test can be used) for the duration of the  Covid-19 declaration under Section 564(b)(1) of the Act, 21  U.S.C. section 360bbb-3(b)(1), unless the authorization is  terminated or revoked. Performed at [facility], [place] [address], [place], [place] [address]   MRSA PCR Screening     Status: None   Collection Time: 05/27/20  10:39 PM   Specimen: Nasopharyngeal  Result Value Ref Range Status   MRSA by PCR NEGATIVE NEGATIVE Final    Comment:        The GeneXpert MRSA Assay (FDA approved for NASAL specimens only), is one component of a comprehensive MRSA colonization surveillance program. It is not intended to diagnose MRSA infection nor to guide or monitor treatment for MRSA infections. Performed at [facility], [place] [address] [place], [place] [address]      Discharge Instructions:   Discharge Instructions    (HEART FAILURE PATIENTS) Call MD:  Anytime you have any of the following symptoms: 1) 3 pound weight gain in 24 hours or 5 pounds in 1 week 2) shortness of breath, with or without a dry hacking cough 3) swelling in the hands, feet or stomach 4) if you have to sleep on extra pillows at night in order to breathe.   Complete by: As directed    Amb Referral to Cardiac Rehabilitation   Complete by: As directed    Diagnosis: Coronary Stents   After initial evaluation and assessments completed: Virtual Based Care may be provided alone or in conjunction with Phase 2 Cardiac Rehab based on patient barriers.: Yes   Diet - low sodium heart healthy   Complete by: As directed    Diet Carb Modified   Complete by: As directed    Increase activity slowly   Complete by: As directed      Allergies as of 05/29/2020   No Known Allergies     Medication List    STOP taking these medications   lisinopril 5 MG tablet Commonly known as: ZESTRIL   pioglitazone 15 MG tablet Commonly known as: ACTOS     TAKE these medications   acetaminophen 325 MG tablet Commonly known as: TYLENOL Take 2 tablets (650 mg total) by mouth every 4 (four) hours as needed for headache or mild pain.   aspirin 81 MG chewable tablet Chew 81 mg by mouth at bedtime.   atorvastatin 40 MG tablet Commonly known as: LIPITOR Take 1 tablet (40 mg  total) by mouth every evening. What changed:   medication strength  See the  new instructions.   b complex vitamins tablet Take 1 tablet by mouth daily. What changed: when to take this   doravirine 100 MG Tabs tablet Commonly known as: Pifeltro TAKE 1 TABLET(100 MG) BY MOUTH DAILY with Tivicay What changed:   how much to take  how to take this  when to take this  additional instructions   fenofibrate 54 MG tablet TAKE 1 TABLET(54 MG) BY MOUTH DAILY What changed: See the new instructions.   freestyle lancets Use as directed three times a day to check blood sugar.  DX E11.9   FREESTYLE TEST STRIPS test strip Generic drug: glucose blood USE TO CHECK BLOOD SUGAR THREE TIMES DAILY   furosemide 20 MG tablet Commonly known as: LASIX Take 1 tablet (20 mg total) by mouth daily. What changed: how much to take   isosorbide mononitrate 30 MG 24 hr tablet Commonly known as: IMDUR Take 1 tablet (30 mg total) by mouth daily.   losartan 25 MG tablet Commonly known as: COZAAR Take 1 tablet (25 mg total) by mouth daily.   metoprolol tartrate 25 MG tablet Commonly known as: LOPRESSOR Take 0.5 tablets (12.5 mg total) by mouth 2 (two) times daily.   nitroGLYCERIN 0.4 MG SL tablet Commonly known as: NITROSTAT PLACE 1 TABLET UNDER THE TONGUE EVERY 5 MINUTES AS NEEDED FOR CHEST PAIN What changed:   how much to take  how to take this  when to take this  reasons to take this  additional instructions   pantoprazole 40 MG tablet Commonly known as: PROTONIX TAKE 1 TABLET(40 MG) BY MOUTH DAILY What changed:   how much to take  how to take this  when to take this  additional instructions   potassium chloride SA 20 MEQ tablet Commonly known as: KLOR-CON TAKE 1 TABLET(20 MEQ) daily What changed: additional instructions   Probiotic Daily Caps Take 1 by mouth daily What changed:   how much to take  how to take this  when to take this  additional instructions   repaglinide 2 MG tablet Commonly known as: PRANDIN Take 2-4 mg by mouth  See admin instructions. Take 4 mg by mouth in the morning before breakfast and 2 mg before lunch and dinner/evening meal   sodium bicarbonate 650 MG tablet Take 650 mg by mouth 3 (three) times daily.   tamsulosin 0.4 MG Caps capsule Commonly known as: FLOMAX Take 0.4 mg by mouth in the morning and at bedtime.   ticagrelor 90 MG Tabs tablet Commonly known as: BRILINTA Take 1 tablet (90 mg total) by mouth 2 (two) times daily.   Tivicay 50 MG tablet Generic drug: dolutegravir TAKE 1 TABLET BY MOUTH DAILY with Pifeltro What changed:   how much to take  how to take this  when to take this  additional instructions   valACYclovir 500 MG tablet Commonly known as: VALTREX TAKE 1 TABLET BY MOUTH DAILY What changed: when to take this   Vascepa 1 g capsule Generic drug: icosapent Ethyl TAKE 2 CAPSULES(2 GRAMS) BY MOUTH TWICE DAILY What changed: See the new instructions.   Vitamin D3 50 MCG (2000 UT) Tabs Take 2,000 Units by mouth in the morning. What changed: Another medication with the same name was removed. Continue taking this medication, and follow the directions you see here.       Follow-up Information    Nafziger, Tommi Rumps, NP Follow up in 1  week(s).   Specialty: Family Medicine Contact information: [address] [place] [address] [phone]        Lelon Perla, MD .   Specialty: Cardiology Contact information: [address] [place] [address] [phone]                Time coordinating discharge: 35 min  Signed:  Geradine Girt DO  [facility] 05/29/2020, 10:18 AM

## 2020-05-29 NOTE — Progress Notes (Signed)
DAILY PROGRESS NOTE   Patient Name: [name] Date of Encounter: 05/29/2020 Cardiologist: Kirk Ruths, MD  Chief Complaint   No complaints  Patient Profile   [name] is a 74 y.o. male with a hx of HIV, CAD with CABG 2003, PCI of graft to ramus and OM branches 2018, PCI 2019 of VG to diag.  PCI to proximal graft, VG to OM jump graft.  Second part of this jump graft which was previously ballooned in 2018 was occluded.  Lt to lt collaterals fill the distal LCX who is being seen today for the evaluation of chest pain at the request of Dr. Eliseo Squires.  Subjective   No issues overnight. Echo showed newly reduced LVEF to 40-45%, noted to have inferior and inferolateral WMA's which are new - went to cath and found to have severe ISR to the SVG to diagonal - s/p angioplasty with angiographic resolution and TIMI III flow. Creatinine stable today.   Objective   Vitals:   05/28/20 2118 05/28/20 2333 05/29/20 0349 05/29/20 0800  BP: (!) 153/83 136/72 134/64 (!) 154/66  Pulse: (!) 58 (!) 59 (!) 55 63  Resp:  13 17 14   Temp:  97.9 F (36.6 C) 97.8 F (36.6 C) 97.8 F (36.6 C)  TempSrc:  Oral Oral Oral  SpO2:  97% 99% 97%  Weight:      Height:        Intake/Output Summary (Last 24 hours) at 05/29/2020 0853 Last data filed at 05/29/2020 0600 Gross per 24 hour  Intake 617.52 ml  Output 1100 ml  Net -482.48 ml   Filed Weights   05/27/20 1813 05/27/20 2223  Weight: 60.8 kg 62.1 kg    Physical Exam   General appearance: alert and no distress Neck: no carotid bruit, no JVD and thyroid not enlarged, symmetric, no tenderness/mass/nodules Lungs: clear to auscultation bilaterally Heart: regular rate and rhythm Abdomen: soft, non-tender; bowel sounds normal; no masses,  no organomegaly Extremities: extremities normal, atraumatic, no cyanosis or edema Pulses: 2+ and symmetric Skin: Skin color, texture, turgor normal. No rashes or lesions Neurologic: Grossly normal Psych:  Pleasant  Inpatient Medications    Scheduled Meds: . acidophilus  1 capsule Oral Q breakfast  . aspirin EC  81 mg Oral Daily  . atorvastatin  40 mg Oral QPM  . dolutegravir  50 mg Oral Daily  . doravirine  100 mg Oral Daily  . fenofibrate  54 mg Oral QHS  . heparin  5,000 Units Subcutaneous Q8H  . icosapent Ethyl  2 g Oral BID  . insulin aspart  0-15 Units Subcutaneous TID AC & HS  . metoprolol tartrate  12.5 mg Oral BID  . pantoprazole  40 mg Oral Daily  . sodium bicarbonate  650 mg Oral TID  . sodium chloride flush  3 mL Intravenous Q12H  . sodium chloride flush  3 mL Intravenous Q12H  . tamsulosin  0.4 mg Oral BID  . ticagrelor  90 mg Oral BID  . valACYclovir  500 mg Oral Daily    Continuous Infusions: . sodium chloride      PRN Meds: sodium chloride, acetaminophen, ondansetron (ZOFRAN) IV, sodium chloride flush   Labs   Results for orders placed or performed during the hospital encounter of 05/27/20 (from the past 48 hour(s))  Basic metabolic panel     Status: Abnormal   Collection Time: 05/27/20  6:30 PM  Result Value Ref Range   Sodium 137 135 - 145  mmol/L   Potassium 4.2 3.5 - 5.1 mmol/L   Chloride 106 98 - 111 mmol/L   CO2 23 22 - 32 mmol/L   Glucose, Bld 212 (H) 70 - 99 mg/dL    Comment: Glucose reference range applies only to samples taken after fasting for at least 8 hours.   BUN 21 8 - 23 mg/dL   Creatinine, Ser 1.71 (H) 0.61 - 1.24 mg/dL   Calcium 8.8 (L) 8.9 - 10.3 mg/dL   GFR, Estimated 42 (L) >60 mL/min    Comment: (NOTE) Calculated using the CKD-EPI Creatinine Equation (2021)    Anion gap 8 5 - 15    Comment: Performed at [facility] [address], [place], [place] [address]  CBC     Status: Abnormal   Collection Time: 05/27/20  6:30 PM  Result Value Ref Range   WBC 5.0 4.0 - 10.5 K/uL   RBC 3.96 (L) 4.22 - 5.81 MIL/uL   Hemoglobin 12.7 (L) 13.0 - 17.0 g/dL   HCT 38.6 (L) 39 - 52 %   MCV 97.5 80.0 - 100.0 fL   MCH 32.1 26.0 - 34.0  pg   MCHC 32.9 30.0 - 36.0 g/dL   RDW 13.1 11.5 - 15.5 %   Platelets 182 150 - 400 K/uL   nRBC 0.0 0.0 - 0.2 %    Comment: Performed at [facility], [place] [address], [place], [place] [address]  Troponin I (High Sensitivity)     Status: Abnormal   Collection Time: 05/27/20  6:30 PM  Result Value Ref Range   Troponin I (High Sensitivity) 154 (HH) <18 ng/L    Comment: CRITICAL RESULT CALLED TO, READ BACK BY AND VERIFIED WITH: Angelita Ingles RN [phone] 1924 M GARRETT (NOTE) Elevated high sensitivity troponin I (hsTnI) values and significant  changes across serial measurements may suggest ACS but many other  chronic and acute conditions are known to elevate hsTnI results.  Refer to the Links section for chest pain algorithms and additional  guidance. Performed at [facility], [place] [address], [place], [place] [address]   Respiratory Panel by RT PCR (Flu A&B, Covid) - Nasopharyngeal Swab     Status: None   Collection Time: 05/27/20  8:42 PM   Specimen: Nasopharyngeal Swab  Result Value Ref Range   SARS Coronavirus 2 by RT PCR NEGATIVE NEGATIVE    Comment: (NOTE) SARS-CoV-2 target nucleic acids are NOT DETECTED.  The SARS-CoV-2 RNA is generally detectable in upper respiratoy specimens during the acute phase of infection. The lowest concentration of SARS-CoV-2 viral copies this assay can detect is 131 copies/mL. A negative result does not preclude SARS-Cov-2 infection and should not be used as the sole basis for treatment or other patient management decisions. A negative result may occur with  improper specimen collection/handling, submission of specimen other than nasopharyngeal swab, presence of viral mutation(s) within the areas targeted by this assay, and inadequate number of viral copies (<131 copies/mL). A negative result must be combined with clinical observations, patient history, and epidemiological information. The expected result is Negative.  Fact Sheet for  Patients:  PinkCheek.be  Fact Sheet for Healthcare Providers:  GravelBags.it  This test is no t yet approved or cleared by the [place] FDA and  has been authorized for detection and/or diagnosis of SARS-CoV-2 by FDA under an Emergency Use Authorization (EUA). This EUA will remain  in effect (meaning this test can be used) for the duration of the  COVID-19 declaration under Section 564(b)(1) of the Act, 21 U.S.C. section 360bbb-3(b)(1), unless the authorization is terminated or revoked sooner.     Influenza A by PCR NEGATIVE NEGATIVE   Influenza B by PCR NEGATIVE NEGATIVE    Comment: (NOTE) The Xpert Xpress SARS-CoV-2/FLU/RSV assay is intended as an aid in  the diagnosis of influenza from Nasopharyngeal swab specimens and  should not be used as a sole basis for treatment. Nasal washings and  aspirates are unacceptable for Xpert Xpress SARS-CoV-2/FLU/RSV  testing.  Fact Sheet for Patients: PinkCheek.be  Fact Sheet for Healthcare Providers: GravelBags.it  This test is not yet approved or cleared by the [place] FDA and  has been authorized for detection and/or diagnosis of SARS-CoV-2 by  FDA under an Emergency Use Authorization (EUA). This EUA will remain  in effect (meaning this test can be used) for the duration of the  Covid-19 declaration under Section 564(b)(1) of the Act, 21  U.S.C. section 360bbb-3(b)(1), unless the authorization is  terminated or revoked. Performed at [facility], [place] [address] [place], [place] [address]   Troponin I (High Sensitivity)     Status: Abnormal   Collection Time: 05/27/20  8:42 PM  Result Value Ref Range   Troponin I (High Sensitivity) 188 (HH) <18 ng/L    Comment: CRITICAL VALUE NOTED.  VALUE IS CONSISTENT WITH PREVIOUSLY REPORTED AND CALLED VALUE. (NOTE) Elevated high sensitivity troponin I (hsTnI) values  and significant  changes across serial measurements may suggest ACS but many other  chronic and acute conditions are known to elevate hsTnI results.  Refer to the Links section for chest pain algorithms and additional  guidance. Performed at [facility], [place] [name], [place], [place] [address]   CBG monitoring, ED     Status: Abnormal   Collection Time: 05/27/20  9:30 PM  Result Value Ref Range   Glucose-Capillary 168 (H) 70 - 99 mg/dL    Comment: Glucose reference range applies only to samples taken after fasting for at least 8 hours.  MRSA PCR Screening     Status: None   Collection Time: 05/27/20 10:39 PM   Specimen: Nasopharyngeal  Result Value Ref Range   MRSA by PCR NEGATIVE NEGATIVE    Comment:        The GeneXpert MRSA Assay (FDA approved for NASAL specimens only), is one component of a comprehensive MRSA colonization surveillance program. It is not intended to diagnose MRSA infection nor to guide or monitor treatment for MRSA infections. Performed at [facility], [place] [address] [place], [place] [address]   Heparin level (unfractionated)     Status: Abnormal   Collection Time: 05/28/20  2:22 AM  Result Value Ref Range   Heparin Unfractionated 0.24 (L) 0.30 - 0.70 IU/mL    Comment: (NOTE) If heparin results are below expected values, and patient dosage has  been confirmed, suggest follow up testing of antithrombin III levels. Performed at [facility], [place] [address], [place], [place] [address]   Hemoglobin A1c     Status: Abnormal   Collection Time: 05/28/20  2:22 AM  Result Value Ref Range   Hgb A1c MFr Bld 7.2 (H) 4.8 - 5.6 %    Comment: (NOTE)         Prediabetes: 5.7 - 6.4         Diabetes: >6.4         Glycemic control for adults with diabetes: <7.0  Mean Plasma Glucose 160 mg/dL    Comment: (NOTE) Performed At: [facility] [place], [place] [id number] Rush Farmer MD MV:[id number]   Comprehensive  metabolic panel     Status: Abnormal   Collection Time: 05/28/20  2:22 AM  Result Value Ref Range   Sodium 139 135 - 145 mmol/L   Potassium 3.8 3.5 - 5.1 mmol/L   Chloride 108 98 - 111 mmol/L   CO2 22 22 - 32 mmol/L   Glucose, Bld 236 (H) 70 - 99 mg/dL    Comment: Glucose reference range applies only to samples taken after fasting for at least 8 hours.   BUN 21 8 - 23 mg/dL   Creatinine, Ser 1.56 (H) 0.61 - 1.24 mg/dL   Calcium 8.8 (L) 8.9 - 10.3 mg/dL   Total Protein 6.1 (L) 6.5 - 8.1 g/dL   Albumin 3.2 (L) 3.5 - 5.0 g/dL   AST 30 15 - 41 U/L   ALT 31 0 - 44 U/L   Alkaline Phosphatase 45 38 - 126 U/L   Total Bilirubin 0.7 0.3 - 1.2 mg/dL   GFR, Estimated 47 (L) >60 mL/min    Comment: (NOTE) Calculated using the CKD-EPI Creatinine Equation (2021)    Anion gap 9 5 - 15    Comment: Performed at [facility], [place] [address], [place], [place] [address]  Magnesium     Status: None   Collection Time: 05/28/20  2:22 AM  Result Value Ref Range   Magnesium 2.0 1.7 - 2.4 mg/dL    Comment: Performed at [facility] [address] [place], [place] [address]  CBC WITH DIFFERENTIAL     Status: Abnormal   Collection Time: 05/28/20  2:22 AM  Result Value Ref Range   WBC 6.7 4.0 - 10.5 K/uL   RBC 3.86 (L) 4.22 - 5.81 MIL/uL   Hemoglobin 12.4 (L) 13.0 - 17.0 g/dL   HCT 36.7 (L) 39 - 52 %   MCV 95.1 80.0 - 100.0 fL   MCH 32.1 26.0 - 34.0 pg   MCHC 33.8 30.0 - 36.0 g/dL   RDW 13.0 11.5 - 15.5 %   Platelets 169 150 - 400 K/uL   nRBC 0.0 0.0 - 0.2 %   Neutrophils Relative % 76 %   Neutro Abs 5.1 1.7 - 7.7 K/uL   Lymphocytes Relative 11 %   Lymphs Abs 0.7 0.7 - 4.0 K/uL   Monocytes Relative 8 %   Monocytes Absolute 0.5 0.1 - 1.0 K/uL   Eosinophils Relative 5 %   Eosinophils Absolute 0.3 0.0 - 0.5 K/uL   Basophils Relative 0 %   Basophils Absolute 0.0 0.0 - 0.1 K/uL   Immature Granulocytes 0 %   Abs Immature Granulocytes 0.02 0.00 - 0.07 K/uL    Comment: Performed at [facility] [address] [place], [place] [address]  Troponin I (High Sensitivity)     Status: Abnormal   Collection Time: 05/28/20  2:22 AM  Result Value Ref Range   Troponin I (High Sensitivity) 1,001 (HH) <18 ng/L    Comment: CRITICAL VALUE NOTED.  VALUE IS CONSISTENT WITH PREVIOUSLY REPORTED AND CALLED VALUE. (NOTE) Elevated high sensitivity troponin I (hsTnI) values and significant  changes across serial measurements may suggest ACS but many other  chronic and acute conditions are known to elevate hsTnI results.  Refer to the Links section for chest pain algorithms and additional  guidance. Performed at [facility], [place] [address] [place], [place] [address]   Glucose, capillary     Status: Abnormal   Collection Time: 05/28/20  5:54 AM  Result Value Ref Range   Glucose-Capillary 158 (H) 70 - 99 mg/dL    Comment: Glucose reference range applies only to samples taken after fasting for at least 8 hours.  Glucose, capillary     Status: Abnormal   Collection Time: 05/28/20 11:20 AM  Result Value Ref Range   Glucose-Capillary 126 (H) 70 - 99 mg/dL    Comment: Glucose reference range applies only to samples taken after fasting for at least 8 hours.   Comment 1 Notify RN    Comment 2 Document in Chart   POCT Activated clotting time     Status: None   Collection Time: 05/28/20  3:25 PM  Result Value Ref Range   Activated Clotting Time 351 seconds  Glucose, capillary     Status: Abnormal   Collection Time: 05/28/20  5:14 PM  Result Value Ref Range   Glucose-Capillary 67 (L) 70 - 99 mg/dL    Comment: Glucose reference range applies only to samples taken after fasting for at least 8 hours.  Glucose, capillary     Status: None   Collection Time: 05/28/20  5:47 PM  Result Value Ref Range   Glucose-Capillary 84 70 - 99 mg/dL    Comment: Glucose reference range applies only to samples taken after fasting for at least 8 hours.  Glucose, capillary     Status: None    Collection Time: 05/28/20  6:38 PM  Result Value Ref Range   Glucose-Capillary 88 70 - 99 mg/dL    Comment: Glucose reference range applies only to samples taken after fasting for at least 8 hours.   Comment 1 Notify RN    Comment 2 Document in Chart   CBC     Status: Abnormal   Collection Time: 05/28/20  6:43 PM  Result Value Ref Range   WBC 5.3 4.0 - 10.5 K/uL   RBC 4.24 4.22 - 5.81 MIL/uL   Hemoglobin 13.5 13.0 - 17.0 g/dL   HCT 40.3 39 - 52 %   MCV 95.0 80.0 - 100.0 fL   MCH 31.8 26.0 - 34.0 pg   MCHC 33.5 30.0 - 36.0 g/dL   RDW 13.0 11.5 - 15.5 %   Platelets 146 (L) 150 - 400 K/uL   nRBC 0.0 0.0 - 0.2 %    Comment: Performed at [facility], [place] [address] [place], [place] [address]  Creatinine, serum     Status: Abnormal   Collection Time: 05/28/20  6:43 PM  Result Value Ref Range   Creatinine, Ser 1.51 (H) 0.61 - 1.24 mg/dL   GFR, Estimated 49 (L) >60 mL/min    Comment: (NOTE) Calculated using the CKD-EPI Creatinine Equation (2021) Performed at [facility] [address] [place], [place] [address]   Glucose, capillary     Status: Abnormal   Collection Time: 05/28/20  9:39 PM  Result Value Ref Range   Glucose-Capillary 130 (H) 70 - 99 mg/dL    Comment: Glucose reference range applies only to samples taken after fasting for at least 8 hours.  CBC     Status: Abnormal   Collection Time: 05/29/20 12:29 AM  Result Value Ref Range   WBC 5.9 4.0 - 10.5 K/uL   RBC 4.17 (L) 4.22 - 5.81 MIL/uL   Hemoglobin 13.4 13.0 - 17.0 g/dL  HCT 39.4 39 - 52 %   MCV 94.5 80.0 - 100.0 fL   MCH 32.1 26.0 - 34.0 pg   MCHC 34.0 30.0 - 36.0 g/dL   RDW 13.0 11.5 - 15.5 %   Platelets 142 (L) 150 - 400 K/uL   nRBC 0.0 0.0 - 0.2 %    Comment: Performed at [facility] [address] [place], [place] [address]  Basic metabolic panel     Status: Abnormal   Collection Time: 05/29/20  4:02 AM  Result Value Ref Range   Sodium 139 135 - 145 mmol/L   Potassium 3.9 3.5 -  5.1 mmol/L   Chloride 110 98 - 111 mmol/L   CO2 23 22 - 32 mmol/L   Glucose, Bld 106 (H) 70 - 99 mg/dL    Comment: Glucose reference range applies only to samples taken after fasting for at least 8 hours.   BUN 19 8 - 23 mg/dL   Creatinine, Ser 1.58 (H) 0.61 - 1.24 mg/dL   Calcium 8.8 (L) 8.9 - 10.3 mg/dL   GFR, Estimated 46 (L) >60 mL/min    Comment: (NOTE) Calculated using the CKD-EPI Creatinine Equation (2021)    Anion gap 6 5 - 15    Comment: Performed at [facility] [address] [place], [place] [address]  Glucose, capillary     Status: Abnormal   Collection Time: 05/29/20  6:57 AM  Result Value Ref Range   Glucose-Capillary 111 (H) 70 - 99 mg/dL    Comment: Glucose reference range applies only to samples taken after fasting for at least 8 hours.   *Note: Due to a large number of results and/or encounters for the requested time period, some results have not been displayed. A complete set of results can be found in Results Review.    ECG   Sinus bradycardia with lateral TWI's- Personally Reviewed  Telemetry   Sinus rhythm - Personally Reviewed  Radiology    DG Chest 2 View  Result Date: 05/27/2020 CLINICAL DATA:  Chest pain. EXAM: CHEST - 2 VIEW COMPARISON:  Multiple priors, most recent October 20, 2017 FINDINGS: Similar cardiomediastinal silhouette. Postsurgical changes of CABG and median sternotomy. Linear and hazy left basilar opacities, similar over multiple priors and likely related to atelectasis or scar. No new focal consolidation. No pleural effusions or pneumothorax. No acute osseous abnormality. IMPRESSION: Left basilar atelectasis/scar without superimposed acute cardiopulmonary disease. Electronically Signed   By: Margaretha Sheffield MD   On: 05/27/2020 19:13   CARDIAC CATHETERIZATION  Result Date: 05/28/2020 Conclusions: 1. Severe native coronary artery disease, as detailed below. 2. Widely patent LIMA to diagonal/LAD. 3. Patent SVG to diagonal with 90%  in-stent restenosis involving the distal anastomosis. 4. Patent SVG to ramus intermedius with chronic total occlusion of jump portion to OM1.  There is 30 to 40% in-stent restenosis at the ostium of the SVG to ramus intermedius. 5. Chronic total occlusion of sequential SVG to RPDA and RPL. 6. Normal left ventricular filling pressure. 7. Successful PTCA to distal SVG to diagonal in-stent restenosis with 0% residual stenosis and TIMI-3 flow. Recommendations: 1. Dual antiplatelet therapy with aspirin and ticagrelor for at least 12 months. 2. Aggressive secondary prevention. 3. Gentle post catheterization hydration given chronic kidney disease. 4. Remove right femoral artery sheath 2 hours after discontinuation of bivalirudin. Nelva Bush, MD [facility]   ECHOCARDIOGRAM COMPLETE  Result Date: 05/28/2020    ECHOCARDIOGRAM REPORT   Patient Name:   [facility]  W Helmer Date of Exam: 05/28/2020 Medical Rec #:  [id number]    Height:       63.0 in Accession #:    [id number]   Weight:       136.9 lb Date of Birth:  [birth year]   BSA:          1.646 m Patient Age:    66 years     BP:           155/68 mmHg Patient Gender: M            HR:           57 bpm. Exam Location:  Inpatient Procedure: 2D Echo, Cardiac Doppler, Color Doppler and Intracardiac            Opacification Agent Indications:    NSTEMI  History:        Patient has prior history of Echocardiogram examinations, most                 recent 01/20/2020. CHF, CAD, Prior CABG, COPD, Aortic Valve                 Disease and mild AS, Signs/Symptoms:Chest Pain; Risk                 Factors:Diabetes, Hypertension and Dyslipidemia.  Sonographer:    Dustin Flock Referring Phys: [id number] [place]  1. Left ventricular ejection fraction, by estimation, is 40 to 45%. The left ventricle has mildly decreased function. The left ventricle demonstrates regional wall motion abnormalities (see scoring diagram/findings for description). There is mild left  ventricular hypertrophy. Left ventricular diastolic parameters are consistent with Grade I diastolic dysfunction (impaired relaxation).  2. Right ventricular systolic function is normal. The right ventricular size is mildly enlarged. There is mildly elevated pulmonary artery systolic pressure.  3. Left atrial size was mildly dilated.  4. The mitral valve is grossly normal. No evidence of mitral valve regurgitation. Moderate mitral annular calcification.  5. The aortic valve is tricuspid. There is mild calcification of the aortic valve. Aortic valve regurgitation is mild.  6. The inferior vena cava is dilated in size with <50% respiratory variability, suggesting right atrial pressure of 15 mmHg. Comparison(s): A prior study was performed on 01/20/20. New decrease in LVEF and new WMA's. Primary cardiology team aware. FINDINGS  Left Ventricle: Left ventricular ejection fraction, by estimation, is 40 to 45%. The left ventricle has mildly decreased function. The left ventricle demonstrates regional wall motion abnormalities. Definity contrast agent was given IV to delineate the left ventricular endocardial borders. The left ventricular internal cavity size was normal in size. There is mild left ventricular hypertrophy. Left ventricular diastolic parameters are consistent with Grade I diastolic dysfunction (impaired relaxation).  LV Wall Scoring: The inferior wall, posterior wall, and mid anterolateral segment are hypokinetic. Infero and inferolateral hypokinesis. Right Ventricle: The right ventricular size is mildly enlarged. No increase in right ventricular wall thickness. Right ventricular systolic function is normal. There is mildly elevated pulmonary artery systolic pressure. The tricuspid regurgitant velocity is 2.51 m/s, and with an assumed right atrial pressure of 15 mmHg, the estimated right ventricular systolic pressure is 96.7 mmHg. Left Atrium: Left atrial size was mildly dilated. Right Atrium: Right atrial  size was normal in size. Pericardium: There is no evidence of pericardial effusion. Mitral Valve: The mitral valve is grossly normal. There is mild thickening of the mitral valve leaflet(s). Moderate mitral annular calcification. No evidence of mitral valve regurgitation.  Tricuspid Valve: The tricuspid valve is normal in structure. Tricuspid valve regurgitation is not demonstrated. Aortic Valve: The aortic valve is tricuspid. There is mild calcification of the aortic valve. Aortic valve regurgitation is mild. Aortic regurgitation PHT measures 710 msec. Aortic valve mean gradient measures 10.0 mmHg. Aortic valve peak gradient measures 17.5 mmHg. Aortic valve area, by VTI measures 1.05 cm. Pulmonic Valve: The pulmonic valve was grossly normal. Pulmonic valve regurgitation is not visualized. Aorta: The aortic root and ascending aorta are structurally normal, with no evidence of dilitation. Venous: The inferior vena cava is dilated in size with less than 50% respiratory variability, suggesting right atrial pressure of 15 mmHg. IAS/Shunts: The atrial septum is grossly normal.  LEFT VENTRICLE PLAX 2D LVIDd:         4.10 cm  Diastology LVIDs:         3.10 cm  LV e' medial:    3.48 cm/s LV PW:         1.20 cm  LV E/e' medial:  28.4 LV IVS:        1.30 cm  LV e' lateral:   9.79 cm/s LVOT diam:     1.80 cm  LV E/e' lateral: 10.1 LV SV:         52 LV SV Index:   31 LVOT Area:     2.54 cm  RIGHT VENTRICLE            IVC RV S prime:     5.44 cm/s  IVC diam: 2.30 cm TAPSE (M-mode): 0.9 cm LEFT ATRIUM             Index       RIGHT ATRIUM           Index LA diam:        3.20 cm 1.94 cm/m  RA Area:     18.00 cm LA Vol (A2C):   46.9 ml 28.49 ml/m RA Volume:   41.00 ml  24.91 ml/m LA Vol (A4C):   41.3 ml 25.09 ml/m LA Biplane Vol: 46.8 ml 28.43 ml/m  AORTIC VALVE AV Area (Vmax):    1.03 cm AV Area (Vmean):   1.00 cm AV Area (VTI):     1.05 cm AV Vmax:           209.00 cm/s AV Vmean:          145.000 cm/s AV VTI:             0.493 m AV Peak Grad:      17.5 mmHg AV Mean Grad:      10.0 mmHg LVOT Vmax:         84.20 cm/s LVOT Vmean:        57.100 cm/s LVOT VTI:          0.203 m LVOT/AV VTI ratio: 0.41 AI PHT:            710 msec  AORTA Ao Root diam: 3.00 cm Ao Asc diam:  3.40 cm MITRAL VALVE                TRICUSPID VALVE MV Area (PHT): 3.85 cm     TR Peak grad:   25.2 mmHg MV Decel Time: 197 msec     TR Vmax:        251.00 cm/s MV E velocity: 99.00 cm/s MV A velocity: 107.00 cm/s  SHUNTS MV E/A ratio:  0.93         Systemic VTI:  0.20 m  Systemic Diam: 1.80 cm Rudean Haskell MD Electronically signed by Rudean Haskell MD Signature Date/Time: 05/28/2020/2:19:54 PM    Final     Cardiac Studies   See above  Assessment   1. Principal Problem: 2.   NSTEMI (non-ST elevated myocardial infarction) ([place]) 3. Active Problems: 4.   Human immunodeficiency virus (HIV) disease ([place]) 5.   Essential hypertension 6.   GERD without esophagitis 7.   Mixed hyperlipidemia 8.   Chronic kidney disease, stage 3b ([place]) 9.   Coronary artery disease 10.   Type 2 diabetes mellitus with stage 3b chronic kidney disease, without long-term current use of insulin ([place]) 11.   Benign prostatic hyperplasia without lower urinary tract symptoms 12.   Chronic diastolic CHF (congestive heart failure) ([place]) 13.   Plan   No further chest pain after PCI yesterday - creatinine stable. No ischemic EKG changes. Current meds include asa 81 mg daily, Brilinta 90 mg BID, atorvastatin 40 mg daily, low dose fenofibrate 54 mg, Vascepa 2G BID, lopressor 12.5 mg BID. Recommend starting low dose ARB - losartan 25 mg daily (given hypertension and newly reduced LVEF).  Will need follow-up after d/c (has appt with Dr. Stanford Breed on 12/2 - can keep this). Ok to d/c today from a cardiology standpoint.  Time Spent Directly with Patient:  I have spent a total of 25 minutes with the patient reviewing hospital notes, telemetry, EKGs,  labs and examining the patient as well as establishing an assessment and plan that was discussed personally with the patient.  > 50% of time was spent in direct patient care.  Length of Stay:  LOS: 1 day   Pixie Casino, MD, [facility], [place] Director of the Advanced Lipid Disorders &  Cardiovascular Risk Reduction Clinic Diplomate of the American Board of Clinical Lipidology Attending Cardiologist  Direct Dial: [phone]  Fax: [phone]  Website:  [email or web address] Osgood Kaushal Vannice 05/29/2020, 8:53 AM

## 2020-05-29 NOTE — Progress Notes (Signed)
Pt discharge home. PIV removed. Discharge information given to pt. All questions answered.

## 2020-05-29 NOTE — TOC Benefit Eligibility Note (Signed)
Transition of Care [facility]) Benefit Eligibility Note    Patient Details  Name: [name] MRN: [id number] Date of Birth: June 12, 1946   Medication/Dose: BRILINTA  90 MG BID  Covered?: Yes  Tier:  (TIER-4 DRUG)  Prescription Coverage Preferred Pharmacy: [facility] with Person/Company/Phone Number:: PRISCILLA  @ [employer] VO # [phone]  Co-Pay: $9.20  Prior Approval: No  Deductible: Met (LOWE INCOME SUBSIDY Osa Craver Phone Number: 05/29/2020, 11:10 AM

## 2020-05-29 NOTE — Progress Notes (Signed)
CARDIAC REHAB PHASE I   PRE:  Rate/Rhythm: 31 SR  BP:  Sitting: 158/86      SaO2: 99 RA  MODE:  Ambulation: 350 ft   POST:  Rate/Rhythm: 82 SR  BP:  Sitting: 187/70    SaO2: 98 RA   Pt ambulated 35ft in hallway independently with steady gait. Pt denies pain, SOB, or dizziness. Pt educated on importance of ASA, Brilinta, and NTG. Pt given heart healthy and diabetic diets. Reviewed site care, restrictions, and exercise guidelines. Will refer to CRP II [place] Rufina [place], RN BSN 05/29/2020 9:27 AM

## 2020-05-29 NOTE — Discharge Instructions (Signed)
Information about your medication: Brilinta (anti-platelet agent) ? ?Generic Name (Brand): ticagrelor (Brilinta), twice daily medication ? ?PURPOSE: You are taking this medication along with aspirin to lower your chance of having a heart attack, stroke, or blood clots in your heart stent. These can be fatal. Brilinta and aspirin help prevent platelets from sticking together and forming a clot that can block an artery or your stent.  ? ?Common SIDE EFFECTS you may experience include: bruising or bleeding more easily, shortness of breath ? ?Do not stop taking BRILINTA without talking to the doctor who prescribes it for you. People who are treated with a stent and stop taking Brilinta too soon, have a higher risk of getting a blood clot in the stent, having a heart attack, or dying. If you stop Brilinta because of bleeding, or for other reasons, your risk of a heart attack or stroke may increase.  ? ?Avoid taking NSAID agents or anti-inflammatory medications such as ibuprofen, naproxen given increased bleed risk with plavix - can use acetaminophen (Tylenol) if needed for pain. ? ?Tell all of your doctors and dentists that you are taking Brilinta. They should talk to the doctor who prescribed Brilinta for you before you have any surgery or invasive procedure.  ? ?Contact your health care provider if you experience: severe or uncontrollable bleeding, pink/red/brown urine, vomiting blood or vomit that looks like "coffee grounds", red or black stools (looks like tar), coughing up blood or blood clots ?----------------------------------------------------------------------------------------------------------------------  ?

## 2020-05-29 NOTE — Telephone Encounter (Signed)
Transition Care Management Unsuccessful Follow-up Telephone Call  Date of discharge and from where:  05/29/2020 from [facility]   Attempts:  1st Attempt  Reason for unsuccessful TCM follow-up call:  Left voice message

## 2020-05-30 NOTE — Telephone Encounter (Signed)
Transition Care Management Unsuccessful Follow-up Telephone Call  Date of discharge and from where:  05/29/2020 from [facility]   Attempts:  2nd Attempt  Reason for unsuccessful TCM follow-up call:  Left voice message

## 2020-05-31 DIAGNOSIS — R351 Nocturia: Secondary | ICD-10-CM | POA: Diagnosis not present

## 2020-05-31 DIAGNOSIS — N401 Enlarged prostate with lower urinary tract symptoms: Secondary | ICD-10-CM | POA: Diagnosis not present

## 2020-05-31 NOTE — Telephone Encounter (Signed)
Transition Care Management Follow-up Telephone Call  Date of discharge and from where: [facility]   How have you been since you were released from the hospital? Patient states he doing good.   Any questions or concerns? No  Items Reviewed:  Did the pt receive and understand the discharge instructions provided? Yes   Medications obtained and verified? Yes   Other? No   Any new allergies since your discharge? No   Dietary orders reviewed? Yes  Do you have support at home? Yes   Home Care and Equipment/Supplies: Were home health services ordered? not applicable If so, what is the name of the agency? N/A  Has the agency set up a time to come to the patient's home? no Were any new equipment or medical supplies ordered?  No What is the name of the medical supply agency? N/A Were you able to get the supplies/equipment? not applicable Do you have any questions related to the use of the equipment or supplies? No  Functional Questionnaire: (I = Independent and D = Dependent) ADLs: I  Bathing/Dressing- I  Meal Prep- I  Eating- I  Maintaining continence- I  Transferring/Ambulation- I  Managing Meds- I  Follow up appointments reviewed:   PCP Hospital f/u appt confirmed? Yes  Scheduled to see Dorothyann Peng  on 06/05/2020 @ 10:30 am.  [facility] f/u appt confirmed? No    Are transportation arrangements needed? No   If their condition worsens, is the pt aware to call PCP or go to the Emergency Dept.? Yes  Was the patient provided with contact information for the PCP's office or ED? Yes  Was to pt encouraged to call back with questions or concerns? Yes ,

## 2020-06-05 ENCOUNTER — Other Ambulatory Visit: Payer: Self-pay

## 2020-06-05 ENCOUNTER — Ambulatory Visit (INDEPENDENT_AMBULATORY_CARE_PROVIDER_SITE_OTHER): Payer: Medicare Other | Admitting: Adult Health

## 2020-06-05 ENCOUNTER — Encounter: Payer: Self-pay | Admitting: Adult Health

## 2020-06-05 VITALS — BP 100/58 | HR 82 | Temp 98.1°F | Ht 63.0 in | Wt 135.1 lb

## 2020-06-05 DIAGNOSIS — Z21 Asymptomatic human immunodeficiency virus [HIV] infection status: Secondary | ICD-10-CM

## 2020-06-05 DIAGNOSIS — N400 Enlarged prostate without lower urinary tract symptoms: Secondary | ICD-10-CM

## 2020-06-05 DIAGNOSIS — I5032 Chronic diastolic (congestive) heart failure: Secondary | ICD-10-CM | POA: Diagnosis not present

## 2020-06-05 DIAGNOSIS — E11319 Type 2 diabetes mellitus with unspecified diabetic retinopathy without macular edema: Secondary | ICD-10-CM

## 2020-06-05 DIAGNOSIS — I10 Essential (primary) hypertension: Secondary | ICD-10-CM | POA: Diagnosis not present

## 2020-06-05 DIAGNOSIS — N1832 Chronic kidney disease, stage 3b: Secondary | ICD-10-CM | POA: Diagnosis not present

## 2020-06-05 DIAGNOSIS — E782 Mixed hyperlipidemia: Secondary | ICD-10-CM | POA: Diagnosis not present

## 2020-06-05 DIAGNOSIS — I214 Non-ST elevation (NSTEMI) myocardial infarction: Secondary | ICD-10-CM

## 2020-06-05 NOTE — Progress Notes (Signed)
Subjective:    Patient ID: [name], male    DOB: 31-Mar-1946, 74 y.o.   MRN: [id number]  He presents to the office today for TCM visit.   Admit Date 05/27/2020 Discharge Date 05/29/2020  The day he presented to the emergency room he was driving his car to go pick up some friends when he suddenly began to experience chest discomfort.  His chest pain was sharp in quality midsternal in location and nonradiating and moderate to severe in intensity.  Discomfort lasted for several hours associated with shortness of breath and diaphoresis.  He took 2 sublingual nitro in route to the emergency department with some improvement in his symptoms  Upon arrival to the emergency department, the patient was found to continue to experience chest discomfort and therefore 3 additional doses of sublingual nitroglycerin were administered throughout his course in the ER each resulting in some improvement in his discomfort.  Initially emergency department work-up revealed a somewhat elevated troponin of 154.  EKG revealed no dynamic ST segment changes.  His chest x-ray was unremarkable.  He was admitted for ACS work-up.  Heparin drip was initiated  Hospital Course  1. NSTEMI  -He has a known history of extensive coronary artery disease status post CABG and multiple interventions including placement of 3 drug-eluting stents in March 2019 -He underwent a PCI on 05/28/2020.  No ischemic EKG changes.   -Lisinopril was changed to losartan 25 mg given hypertension and newly reduced LVEF.  He was continued on Imdur and Lasix was also changed to 20 mg daily.  He was also started on Brilinta 90 mg  2. CAD  -Continue with current medications and follow-up with cardiology  3. HIV  - Follow up with ID - No changes in medications   4. Essential Hypertension  - See above changes  5. CKD, Stage 3 - stable   6. Type 2 DM  -Continues on home meds  7. Chronic diastolic CHF - Stable during hospital admission    8. GERD - Continue with PPI   9. Mixed Hyperlipidemia  - Continued on home dose of statin   10. BPH  - Continues to Flomax  Today in the office he reports that he is doing well since being discharged from the hospital.  He denies chest pain, shortness of breath, bruising, or issues with bleeding.  He has been monitoring his blood pressure at home and reports readings mostly in the 120's but has seen some spikes into the 160s to 170s.  His biggest complaint is that of weakness and fatigue since being discharged from the hospital this seems to be improving but he states "I just do not bounce back like have in the past".  He is staying active and trying to eat a heart healthy diet.  He has a follow-up with his cardiologist in 1 month.  Review of Systems  Constitutional: Positive for fatigue.  HENT: Negative.   Eyes: Negative.   Respiratory: Negative.   Cardiovascular: Negative.   Gastrointestinal: Negative.   Endocrine: Negative.   Genitourinary: Negative.   Musculoskeletal: Negative.   Skin: Negative.   Allergic/Immunologic: Negative.   Neurological: Positive for weakness.  Hematological: Negative.   Psychiatric/Behavioral: Negative.   All other systems reviewed and are negative.  Past Medical History:  Diagnosis Date   Absolute anemia 05/28/2014   Aortic stenosis 09/17/2015   Arthritis    Ascites 11/18/2013   BELLS PALSY 07/19/2010   Qualifier: Diagnosis of  By: Harlow Mares  Edith    °• CAD (coronary artery disease)   ° a. s/p CABG in 2003 b. s/p PCI to SVG-PDA in 07/2015 c. 11/2016: cath showing severe native CAD with patent LIMA-LAD and SVG-D1 with 80% stenosis of SVG-OM1-OM2. Initially medical management was recommended --> presented with recurrent angina --> s/p Synergy DES to proximal body of SVG-OM1-OM2, POBA to distal graft.   °• CAD- S/P PCI SVG-OM1 12/01/16 05/12/2006  ° Qualifier: Diagnosis of  By: Campbell MD, [name]    °• Carotid bruit 07/10/2015  °• Chest pain  11/22/2016  °• Chronic kidney disease   °• Chronic renal insufficiency, stage III (moderate) (HCC) 12/02/2016  °• Constipation 05/28/2014  °• COPD 07/20/2008  ° Qualifier: Diagnosis of  By: [name] MD, James W   °• DEPRESSION 09/03/2006  ° Qualifier: Diagnosis of  By: Campbell MD, [name]    °• Dermatitis 11/27/2012  °• Diabetes mellitus   °• Diarrhea 11/20/2013  °• DM (diabetes mellitus), type 2 with ophthalmic complications (HCC) 09/03/2006  ° Qualifier: Diagnosis of  By: Campbell MD, [name]    °• Dry eye syndrome 12/20/2010  °• Epicondylitis 06/05/2013  ° right  °• Erectile dysfunction 01/01/2012  °• Essential hypertension 05/12/2006  ° Qualifier: Diagnosis of  By: Campbell MD, [name]    °• GENITAL HERPES 05/03/2009  ° Qualifier: Diagnosis of  By: Campbell MD, [name]    °• GERD 09/03/2006  ° Qualifier: Diagnosis of  By: Campbell MD, [name]    °• GERD (gastroesophageal reflux disease)   °• HEARING LOSS, SENSORINEURAL 05/12/2006  ° Qualifier: Diagnosis of  By: Campbell MD, [name]    °• HEMATOCHEZIA 04/18/2008  ° Annotation: 9/09 during bout of constipation Qualifier: Diagnosis of  By: Campbell MD, [name]    °• HIP PAIN, BILATERAL 07/17/2008  ° Qualifier: Diagnosis of  By: [name] MD, James W   °• History of depression   °• History of kidney stones   °• HIV (human immunodeficiency virus infection) (HCC) 1991  ° on meds since initial dx.   °• HLD (hyperlipidemia) 11/19/2013  °• Human immunodeficiency virus (HIV) disease (HCC) 05/12/2006  ° Qualifier: Diagnosis of  By: Campbell MD, [name]    °• Hx of CABG 09/03/2006  ° Annotation: 2003 Qualifier: Diagnosis of  By: Campbell MD, [name]    °• Hyperkalemia 03/23/2014  °• Hyperlipidemia   °• Hypertension   °• Hyponatremia 03/23/2014  °• INGUINAL LYMPHADENOPATHY, RIGHT 04/03/2009  ° Qualifier: Diagnosis of  By: Campbell MD, [name]    °• Keratoma 01/24/2015  °• KNEE PAIN, BILATERAL 07/17/2008  ° Qualifier: Diagnosis of  By: [name] MD, James W   °• Lesion of breast 07/21/2015  °• Lipodystrophy 12/20/2010  °• Memory loss  09/18/2008  ° Qualifier: Diagnosis of  By: [name] MD, James W   °• Metatarsal deformity 01/24/2015  °• Nasal abscess 12/04/2015  °• Night sweats 07/06/2012  °• Nocturia 03/06/2013  °• NSTEMI (non-ST elevated myocardial infarction) (HCC) 11/29/2016  °• Pain in joint, ankle and foot 01/19/2015  °• Pancreatitis 11/2013  ° attributed to HIV meds.   °• Pedal edema 05/21/2014  °• PERIPHERAL VASCULAR DISEASE 07/20/2008  ° Qualifier: Diagnosis of  By: [name] MD, James W   °• Posterior cervical lymphadenopathy 03/30/2014  °• Protein-calorie malnutrition, severe (HCC) 03/23/2014  °• SHINGLES, HX OF 05/03/2009  ° Annotation: R leg Qualifier: Diagnosis of  By: Campbell MD, [name]    °• STEMI (ST elevation myocardial infarction) (HCC) 07/29/2015  °• Unstable angina (HCC) 11/24/2016  °•   WEIGHT LOSS, ABNORMAL 04/03/2009   Qualifier: Diagnosis of  By: [name], [name]      Social History   Socioeconomic History   Marital status: Divorced    Spouse name: Not on file   Number of children: 4   Years of education: Not on file   Highest education level: Not on file  Occupational History   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   Tobacco Use   Smoking status: Never Smoker   Smokeless tobacco: Never Used  Vaping Use   Vaping Use: Never used  Substance and Sexual Activity   Alcohol use: Not Currently    Alcohol/week: 0.0 standard drinks    Comment: rare   Drug use: No   Sexual activity: Not Currently    Comment: declined condoms  Other Topics Concern   Not on file  Social History Narrative   Lives alone.  Supportive friends and family.  His HIV Dx is not a secret.    Social Determinants of Health   Financial Resource Strain: Low Risk    Difficulty of Paying Living Expenses: Not hard at all  Food Insecurity: No Food Insecurity   Worried About Charity fundraiser in the Last Year: Never true   [place] in the Last Year: Never true   Transportation Needs: No Transportation Needs   Lack of Transportation (Medical): No   Lack of Transportation (Non-Medical): No  Physical Activity: Sufficiently Active   Days of Exercise per Week: 7 days   Minutes of Exercise per Session: 40 min  Stress: No Stress Concern Present   Feeling of Stress : Not at all  Social Connections: Moderately Isolated   Frequency of Communication with Friends and Family: More than three times a week   Frequency of Social Gatherings with Friends and Family: Three times a week   Attends Religious Services: More than 4 times per year   Active Member of Clubs or Organizations: No   Attends Archivist Meetings: Never   Marital Status: Divorced  Human resources officer Violence: Not At Risk   Fear of Current or Ex-Partner: No   Emotionally Abused: No   Physically Abused: No   Sexually Abused: No    Past Surgical History:  Procedure Laterality Date   CARDIAC CATHETERIZATION N/A 07/29/2015   Procedure: Left Heart Cath and Coronary Angiography;  Surgeon: [name], MD;  Location: [facility];  Service: Cardiovascular;  Laterality: N/A;   CARDIAC CATHETERIZATION N/A 07/29/2015   Procedure: Coronary Stent Intervention;  Surgeon: [name], MD;  Location: [facility];  Service: Cardiovascular;  Laterality: N/A;   COLONOSCOPY  12/02/2019   CORONARY ARTERY BYPASS GRAFT  05/2002   CORONARY BALLOON ANGIOPLASTY N/A 05/28/2020   Procedure: CORONARY BALLOON ANGIOPLASTY;  Surgeon: Nelva Bush, MD;  Location: [facility];  Service: Cardiovascular;  Laterality: N/A;   CORONARY STENT INTERVENTION N/A 12/01/2016   Procedure: Coronary Stent Intervention;  Surgeon: [name], MD;  Location: [facility];  Service: Cardiovascular;  Laterality: N/A;   CORONARY STENT INTERVENTION N/A 10/21/2017   Procedure: CORONARY STENT INTERVENTION;  Surgeon: Jettie Booze, MD;  Location: [facility];   Service: Cardiovascular;  Laterality: N/A;   LEFT HEART CATH AND CORS/GRAFTS ANGIOGRAPHY N/A 11/24/2016   Procedure: Left Heart Cath and Cors/Grafts Angiography;  Surgeon: Nelva Bush, MD;  Location: [facility];  Service: Cardiovascular;  Laterality: N/A;   LEFT HEART CATH AND CORS/GRAFTS ANGIOGRAPHY N/A 12/01/2016   Procedure: Left Heart Cath and Cors/Grafts Angiography;  Surgeon: [name], MD;  Location: [facility];  Service: Cardiovascular;  Laterality: N/A;   LEFT HEART CATH AND CORS/GRAFTS ANGIOGRAPHY N/A 10/21/2017   Procedure: LEFT HEART CATH AND CORS/GRAFTS ANGIOGRAPHY;  Surgeon: Jettie Booze, MD;  Location: [facility];  Service: Cardiovascular;  Laterality: N/A;   LEFT HEART CATH AND CORS/GRAFTS ANGIOGRAPHY N/A 05/28/2020   Procedure: LEFT HEART CATH AND CORS/GRAFTS ANGIOGRAPHY;  Surgeon: Nelva Bush, MD;  Location: [facility];  Service: Cardiovascular;  Laterality: N/A;   VASECTOMY      Family History  Problem Relation Age of Onset   Hyperlipidemia Mother    Diabetes Mother        paternal grandparents/1 brother   Hyperlipidemia Father    Hypertension Father        paternal grandmother/3 brothers/1 sister   Arthritis Other        mother/father/paternal grandparents   Breast cancer Maternal Aunt        paternal aunt   Lung cancer Maternal Aunt    Heart disease Other        parents/maternal grandparents/ 2 brothers   Stroke Paternal Grandmother    Mental retardation Sister    Colon cancer Neg Hx    Esophageal cancer Neg Hx    Pancreatic cancer Neg Hx    Stomach cancer Neg Hx    Liver disease Neg Hx    Colon polyps Neg Hx    Rectal cancer Neg Hx     No Known Allergies  Current Outpatient Medications on File Prior to Visit  Medication Sig Dispense Refill   acetaminophen (TYLENOL) 325 MG tablet Take 2 tablets (650 mg total) by mouth every 4 (four) hours as needed for headache or mild pain.      aspirin 81 MG chewable tablet Chew 81 mg by mouth at bedtime.      atorvastatin (LIPITOR) 40 MG tablet Take 1 tablet (40 mg total) by mouth every evening. 30 tablet 0   b complex vitamins tablet Take 1 tablet by mouth daily. (Patient taking differently: Take 1 tablet by mouth daily with breakfast. ) 30 tablet 11   Cholecalciferol (VITAMIN D3) 50 MCG (2000 UT) TABS Take 2,000 Units by mouth in the morning.     dolutegravir (TIVICAY) 50 MG tablet TAKE 1 TABLET BY MOUTH DAILY with Pifeltro (Patient taking differently: Take 50 mg by mouth See admin instructions. Take 50 mg by mouth in the morning (with Pifeltro)) 30 tablet 5   doravirine (PIFELTRO) 100 MG TABS tablet TAKE 1 TABLET(100 MG) BY MOUTH DAILY with Tivicay (Patient taking differently: Take 100 mg by mouth See admin instructions. Take 100 mg by mouth in the morning (with Tivicay)) 30 tablet 5   fenofibrate 54 MG tablet TAKE 1 TABLET(54 MG) BY MOUTH DAILY (Patient taking differently: Take 54 mg by mouth at bedtime. ) 90 tablet 2   furosemide (LASIX) 20 MG tablet Take 1 tablet (20 mg total) by mouth daily. 180 tablet 1   glucose blood (FREESTYLE TEST STRIPS) test strip USE TO CHECK BLOOD SUGAR THREE TIMES DAILY 300 each 3   isosorbide mononitrate (IMDUR) 30 MG 24 hr tablet Take 1 tablet (30 mg total) by mouth daily. 90 tablet 2   Lancets (FREESTYLE) lancets Use as directed three times a day to check blood sugar.  DX E11.9 100 each 5  °• losartan (COZAAR) 25 MG tablet Take 1 tablet (25 mg total) by mouth daily. 30 tablet 1  °• metoprolol tartrate (LOPRESSOR) 25 MG tablet Take 0.5 tablets (12.5 mg total) by mouth 2 (two) times daily. 30 tablet 0  °• nitroGLYCERIN (NITROSTAT) 0.4 MG SL tablet PLACE 1 TABLET UNDER THE TONGUE EVERY 5 MINUTES AS NEEDED FOR CHEST PAIN (Patient taking differently: Place 0.4 mg under the tongue every 5 (five) minutes as needed for chest pain. ) 25 tablet 3  °• pantoprazole (PROTONIX) 40 MG tablet TAKE 1 TABLET(40  MG) BY MOUTH DAILY (Patient taking differently: Take 40 mg by mouth daily before breakfast. ) 30 tablet 11  °• potassium chloride SA (KLOR-CON) 20 MEQ tablet TAKE 1 TABLET(20 MEQ) daily 270 tablet 1  °• Probiotic Product (PROBIOTIC DAILY) CAPS Take 1 by mouth daily (Patient taking differently: Take 1 capsule by mouth daily with breakfast. ) 30 capsule 11  °• repaglinide (PRANDIN) 2 MG tablet Take 2-4 mg by mouth See admin instructions. Take 4 mg by mouth in the morning before breakfast and 2 mg before lunch and dinner/evening meal    °• sodium bicarbonate 650 MG tablet Take 650 mg by mouth 3 (three) times daily.    °• tamsulosin (FLOMAX) 0.4 MG CAPS capsule Take 0.4 mg by mouth in the morning and at bedtime.   11  °• ticagrelor (BRILINTA) 90 MG TABS tablet Take 1 tablet (90 mg total) by mouth 2 (two) times daily. 60 tablet 0  °• valACYclovir (VALTREX) 500 MG tablet TAKE 1 TABLET BY MOUTH DAILY (Patient taking differently: Take 500 mg by mouth in the morning. ) 30 tablet 5  °• VASCEPA 1 g capsule TAKE 2 CAPSULES(2 GRAMS) BY MOUTH TWICE DAILY (Patient taking differently: Take 2 g by mouth 2 (two) times daily. ) 120 capsule 2  ° °No current facility-administered medications on file prior to visit.  ° ° °BP (!) 100/58 (BP Location: Left Arm, Patient Position: Sitting, Cuff Size: Normal)    Pulse 82    Temp 98.1 °F (36.7 °C) (Oral)    Ht 5' 3" (1.6 m)    Wt 135 lb 1.6 oz (61.3 kg)    SpO2 97%    BMI 23.93 kg/m²  ° ° °   °Objective:  ° Physical Exam °Vitals and nursing note reviewed.  °Constitutional:   °   General: He is not in acute distress. °   Appearance: Normal appearance. He is well-developed and overweight.  °HENT:  °   Head: Normocephalic and atraumatic.  °Eyes:  °   General:     °   Right eye: No discharge.     °   Left eye: No discharge.  °   Extraocular Movements: Extraocular movements intact.  °   Conjunctiva/sclera: Conjunctivae normal.  °   Pupils: Pupils are equal, round, and reactive to light.  °Neck:  °    Vascular: No carotid bruit.  °   Trachea: No tracheal deviation.  °Cardiovascular:  °   Rate and Rhythm: Normal rate and regular rhythm.  °   Pulses: Normal pulses.  °   Heart sounds: Murmur heard.  °No friction rub. No gallop.   °Pulmonary:  °   Effort: Pulmonary effort is normal. No respiratory distress.  °   Breath sounds: Normal breath sounds. No stridor. No wheezing, rhonchi or rales.  °Chest:  °   Chest wall: No tenderness.  °Abdominal:  °     General: Bowel sounds are normal. There is no distension.  °   Palpations: Abdomen is soft. There is no mass.  °   Tenderness: There is no abdominal tenderness. There is no right CVA tenderness, left CVA tenderness, guarding or rebound.  °   Hernia: No hernia is present.  °Musculoskeletal:     °   General: No swelling, tenderness, deformity or signs of injury. Normal range of motion.  °   Right lower leg: No edema.  °   Left lower leg: No edema.  °Lymphadenopathy:  °   Cervical: No cervical adenopathy.  °Skin: °   General: Skin is warm and dry.  °   Capillary Refill: Capillary refill takes less than 2 seconds.  °   Coloration: Skin is not jaundiced or pale.  °   Findings: No bruising, erythema, lesion or rash.  °Neurological:  °   General: No focal deficit present.  °   Mental Status: He is alert and oriented to person, place, and time.  °   Cranial Nerves: No cranial nerve deficit.  °   Sensory: No sensory deficit.  °   Motor: No weakness.  °   Coordination: Coordination normal.  °   Gait: Gait normal.  °   Deep Tendon Reflexes: Reflexes normal.  °Psychiatric:     °   Mood and Affect: Mood normal.     °   Behavior: Behavior normal.     °   Thought Content: Thought content normal.     °   Judgment: Judgment normal.  ° ° ° °   °Assessment & Plan:  °1. NSTEMI (non-ST elevated myocardial infarction) (HCC) °-Reviewed hospital notes, imaging, labs, and discharge instructions.  All questions answered to the best of my ability °-Stay active but do not over exert himself at this  point °-Continue with medications that were prescribed and the hospital until followed up with cardiology.  Advised to go back to the hospital with any signs or symptoms of recurrent MI °- CMP with eGFR([facility]); Future °- CMP with eGFR([facility]) ° °2. Mixed hyperlipidemia °- Continue with statin  ° °3. Chronic renal impairment, stage 3b (HCC) °-Stable check CMP with GFR today °- CMP with eGFR([facility]); Future °- CMP with eGFR([facility]) ° °4. Type 2 diabetes mellitus with retinopathy without macular edema, unspecified laterality, unspecified retinopathy severity, unspecified whether long term insulin use (HCC) °-Tinea with home medication °- CMP with eGFR([facility]); Future °- CMP with eGFR([facility]) ° °5. Essential hypertension °-BP on the low side today, continue to monitor at home with return precautions reviewed °- CMP with eGFR([facility]); Future °- CMP with eGFR([facility]) ° °6. Asymptomatic HIV infection (HCC) °-To new home medications.  Follow-up with ID as directed ° ° °7. Chronic diastolic CHF (congestive heart failure) (HCC) °-Euvolemic in the office today.  Can continue with Lasix 20 mg until followed up with cardiology °- CMP with eGFR([facility]); Future °- CMP with eGFR([facility]) ° °8. Benign prostatic hyperplasia without lower urinary tract symptoms °- Continue with Flomax ° °Cory Nafziger, NP ° ° °

## 2020-06-06 ENCOUNTER — Other Ambulatory Visit: Payer: Self-pay | Admitting: Adult Health

## 2020-06-06 DIAGNOSIS — N1832 Chronic kidney disease, stage 3b: Secondary | ICD-10-CM

## 2020-06-06 LAB — COMPLETE METABOLIC PANEL WITH GFR
AG Ratio: 1.6 (calc) (ref 1.0–2.5)
ALT: 69 U/L — ABNORMAL HIGH (ref 9–46)
AST: 56 U/L — ABNORMAL HIGH (ref 10–35)
Albumin: 4.1 g/dL (ref 3.6–5.1)
Alkaline phosphatase (APISO): 56 U/L (ref 35–144)
BUN/Creatinine Ratio: 14 (calc) (ref 6–22)
BUN: 30 mg/dL — ABNORMAL HIGH (ref 7–25)
CO2: 25 mmol/L (ref 20–32)
Calcium: 9.2 mg/dL (ref 8.6–10.3)
Chloride: 105 mmol/L (ref 98–110)
Creat: 2.1 mg/dL — ABNORMAL HIGH (ref 0.70–1.18)
GFR, Est African American: 35 mL/min/{1.73_m2} — ABNORMAL LOW (ref >=60)
GFR, Est Non African American: 31 mL/min/{1.73_m2} — ABNORMAL LOW (ref >=60)
Globulin: 2.5 g/dL (calc) (ref 1.9–3.7)
Glucose, Bld: 276 mg/dL — ABNORMAL HIGH (ref 65–99)
Potassium: 4.5 mmol/L (ref 3.5–5.3)
Sodium: 140 mmol/L (ref 135–146)
Total Bilirubin: 1.2 mg/dL (ref 0.2–1.2)
Total Protein: 6.6 g/dL (ref 6.1–8.1)

## 2020-06-07 ENCOUNTER — Ambulatory Visit: Payer: Medicare Other | Admitting: Cardiology

## 2020-06-07 ENCOUNTER — Inpatient Hospital Stay (HOSPITAL_COMMUNITY)
Admission: EM | Admit: 2020-06-07 | Discharge: 2020-06-18 | DRG: 853 | Disposition: A | Discharge status: Home / Self Care | Payer: Medicare Other | Attending: Internal Medicine | Admitting: Internal Medicine

## 2020-06-07 ENCOUNTER — Encounter (HOSPITAL_COMMUNITY): Payer: Self-pay | Admitting: Internal Medicine

## 2020-06-07 ENCOUNTER — Emergency Department (HOSPITAL_COMMUNITY): Payer: Medicare Other

## 2020-06-07 ENCOUNTER — Other Ambulatory Visit: Payer: Self-pay

## 2020-06-07 ENCOUNTER — Other Ambulatory Visit: Payer: Self-pay | Admitting: Physician Assistant

## 2020-06-07 DIAGNOSIS — N1832 Chronic kidney disease, stage 3b: Secondary | ICD-10-CM | POA: Diagnosis not present

## 2020-06-07 DIAGNOSIS — K219 Gastro-esophageal reflux disease without esophagitis: Secondary | ICD-10-CM | POA: Diagnosis present

## 2020-06-07 DIAGNOSIS — R1013 Epigastric pain: Secondary | ICD-10-CM

## 2020-06-07 DIAGNOSIS — R509 Fever, unspecified: Secondary | ICD-10-CM | POA: Diagnosis not present

## 2020-06-07 DIAGNOSIS — R52 Pain, unspecified: Secondary | ICD-10-CM | POA: Diagnosis not present

## 2020-06-07 DIAGNOSIS — E1165 Type 2 diabetes mellitus with hyperglycemia: Secondary | ICD-10-CM | POA: Diagnosis not present

## 2020-06-07 DIAGNOSIS — F32A Depression, unspecified: Secondary | ICD-10-CM | POA: Diagnosis present

## 2020-06-07 DIAGNOSIS — J449 Chronic obstructive pulmonary disease, unspecified: Secondary | ICD-10-CM | POA: Diagnosis present

## 2020-06-07 DIAGNOSIS — I248 Other forms of acute ischemic heart disease: Secondary | ICD-10-CM | POA: Diagnosis present

## 2020-06-07 DIAGNOSIS — R1011 Right upper quadrant pain: Secondary | ICD-10-CM | POA: Diagnosis not present

## 2020-06-07 DIAGNOSIS — K8010 Calculus of gallbladder with chronic cholecystitis without obstruction: Secondary | ICD-10-CM | POA: Diagnosis not present

## 2020-06-07 DIAGNOSIS — D696 Thrombocytopenia, unspecified: Secondary | ICD-10-CM | POA: Diagnosis not present

## 2020-06-07 DIAGNOSIS — E782 Mixed hyperlipidemia: Secondary | ICD-10-CM | POA: Diagnosis not present

## 2020-06-07 DIAGNOSIS — E872 Acidosis: Secondary | ICD-10-CM | POA: Diagnosis not present

## 2020-06-07 DIAGNOSIS — Z79899 Other long term (current) drug therapy: Secondary | ICD-10-CM | POA: Diagnosis not present

## 2020-06-07 DIAGNOSIS — N400 Enlarged prostate without lower urinary tract symptoms: Secondary | ICD-10-CM | POA: Diagnosis present

## 2020-06-07 DIAGNOSIS — I5032 Chronic diastolic (congestive) heart failure: Secondary | ICD-10-CM | POA: Diagnosis not present

## 2020-06-07 DIAGNOSIS — K8020 Calculus of gallbladder without cholecystitis without obstruction: Secondary | ICD-10-CM

## 2020-06-07 DIAGNOSIS — N179 Acute kidney failure, unspecified: Secondary | ICD-10-CM | POA: Diagnosis present

## 2020-06-07 DIAGNOSIS — D649 Anemia, unspecified: Secondary | ICD-10-CM | POA: Diagnosis present

## 2020-06-07 DIAGNOSIS — K3189 Other diseases of stomach and duodenum: Secondary | ICD-10-CM | POA: Diagnosis not present

## 2020-06-07 DIAGNOSIS — R778 Other specified abnormalities of plasma proteins: Secondary | ICD-10-CM | POA: Diagnosis not present

## 2020-06-07 DIAGNOSIS — E1122 Type 2 diabetes mellitus with diabetic chronic kidney disease: Secondary | ICD-10-CM | POA: Diagnosis not present

## 2020-06-07 DIAGNOSIS — F339 Major depressive disorder, recurrent, unspecified: Secondary | ICD-10-CM | POA: Diagnosis present

## 2020-06-07 DIAGNOSIS — A419 Sepsis, unspecified organism: Secondary | ICD-10-CM

## 2020-06-07 DIAGNOSIS — Z833 Family history of diabetes mellitus: Secondary | ICD-10-CM

## 2020-06-07 DIAGNOSIS — R748 Abnormal levels of other serum enzymes: Secondary | ICD-10-CM | POA: Diagnosis not present

## 2020-06-07 DIAGNOSIS — R935 Abnormal findings on diagnostic imaging of other abdominal regions, including retroperitoneum: Secondary | ICD-10-CM

## 2020-06-07 DIAGNOSIS — A4151 Sepsis due to Escherichia coli [E. coli]: Secondary | ICD-10-CM | POA: Diagnosis not present

## 2020-06-07 DIAGNOSIS — Z7902 Long term (current) use of antithrombotics/antiplatelets: Secondary | ICD-10-CM | POA: Diagnosis not present

## 2020-06-07 DIAGNOSIS — R1084 Generalized abdominal pain: Secondary | ICD-10-CM | POA: Diagnosis not present

## 2020-06-07 DIAGNOSIS — I130 Hypertensive heart and chronic kidney disease with heart failure and stage 1 through stage 4 chronic kidney disease, or unspecified chronic kidney disease: Secondary | ICD-10-CM | POA: Diagnosis not present

## 2020-06-07 DIAGNOSIS — K8063 Calculus of gallbladder and bile duct with acute cholecystitis with obstruction: Secondary | ICD-10-CM | POA: Diagnosis present

## 2020-06-07 DIAGNOSIS — E785 Hyperlipidemia, unspecified: Secondary | ICD-10-CM | POA: Diagnosis present

## 2020-06-07 DIAGNOSIS — K8033 Calculus of bile duct with acute cholangitis with obstruction: Secondary | ICD-10-CM | POA: Diagnosis not present

## 2020-06-07 DIAGNOSIS — Z21 Asymptomatic human immunodeficiency virus [HIV] infection status: Secondary | ICD-10-CM | POA: Diagnosis present

## 2020-06-07 DIAGNOSIS — Z7982 Long term (current) use of aspirin: Secondary | ICD-10-CM

## 2020-06-07 DIAGNOSIS — J9811 Atelectasis: Secondary | ICD-10-CM | POA: Diagnosis not present

## 2020-06-07 DIAGNOSIS — Z01818 Encounter for other preprocedural examination: Secondary | ICD-10-CM | POA: Diagnosis not present

## 2020-06-07 DIAGNOSIS — I2510 Atherosclerotic heart disease of native coronary artery without angina pectoris: Secondary | ICD-10-CM | POA: Diagnosis present

## 2020-06-07 DIAGNOSIS — I25119 Atherosclerotic heart disease of native coronary artery with unspecified angina pectoris: Secondary | ICD-10-CM | POA: Diagnosis not present

## 2020-06-07 DIAGNOSIS — K8042 Calculus of bile duct with acute cholecystitis without obstruction: Secondary | ICD-10-CM | POA: Diagnosis not present

## 2020-06-07 DIAGNOSIS — R6520 Severe sepsis without septic shock: Secondary | ICD-10-CM | POA: Diagnosis not present

## 2020-06-07 DIAGNOSIS — A6002 Herpesviral infection of other male genital organs: Secondary | ICD-10-CM | POA: Diagnosis present

## 2020-06-07 DIAGNOSIS — Z951 Presence of aortocoronary bypass graft: Secondary | ICD-10-CM | POA: Diagnosis not present

## 2020-06-07 DIAGNOSIS — K851 Biliary acute pancreatitis without necrosis or infection: Secondary | ICD-10-CM

## 2020-06-07 DIAGNOSIS — B20 Human immunodeficiency virus [HIV] disease: Secondary | ICD-10-CM

## 2020-06-07 DIAGNOSIS — K838 Other specified diseases of biliary tract: Secondary | ICD-10-CM | POA: Diagnosis not present

## 2020-06-07 DIAGNOSIS — Z20822 Contact with and (suspected) exposure to covid-19: Secondary | ICD-10-CM | POA: Diagnosis present

## 2020-06-07 DIAGNOSIS — Z955 Presence of coronary angioplasty implant and graft: Secondary | ICD-10-CM | POA: Diagnosis not present

## 2020-06-07 DIAGNOSIS — R7881 Bacteremia: Secondary | ICD-10-CM | POA: Diagnosis not present

## 2020-06-07 DIAGNOSIS — Z823 Family history of stroke: Secondary | ICD-10-CM

## 2020-06-07 DIAGNOSIS — R7401 Elevation of levels of liver transaminase levels: Secondary | ICD-10-CM | POA: Diagnosis not present

## 2020-06-07 DIAGNOSIS — K2950 Unspecified chronic gastritis without bleeding: Secondary | ICD-10-CM | POA: Diagnosis not present

## 2020-06-07 DIAGNOSIS — I10 Essential (primary) hypertension: Secondary | ICD-10-CM | POA: Diagnosis not present

## 2020-06-07 DIAGNOSIS — K8050 Calculus of bile duct without cholangitis or cholecystitis without obstruction: Secondary | ICD-10-CM | POA: Diagnosis not present

## 2020-06-07 DIAGNOSIS — R079 Chest pain, unspecified: Secondary | ICD-10-CM | POA: Diagnosis not present

## 2020-06-07 DIAGNOSIS — I252 Old myocardial infarction: Secondary | ICD-10-CM

## 2020-06-07 DIAGNOSIS — Z8249 Family history of ischemic heart disease and other diseases of the circulatory system: Secondary | ICD-10-CM

## 2020-06-07 DIAGNOSIS — Z9889 Other specified postprocedural states: Secondary | ICD-10-CM | POA: Diagnosis not present

## 2020-06-07 DIAGNOSIS — R932 Abnormal findings on diagnostic imaging of liver and biliary tract: Secondary | ICD-10-CM | POA: Diagnosis not present

## 2020-06-07 DIAGNOSIS — K8032 Calculus of bile duct with acute cholangitis without obstruction: Secondary | ICD-10-CM | POA: Diagnosis present

## 2020-06-07 DIAGNOSIS — R945 Abnormal results of liver function studies: Secondary | ICD-10-CM | POA: Diagnosis not present

## 2020-06-07 LAB — CBC WITH DIFFERENTIAL/PLATELET
Abs Immature Granulocytes: 0 10*3/uL (ref 0.00–0.07)
Basophils Absolute: 0 10*3/uL (ref 0.0–0.1)
Basophils Relative: 0 %
Eosinophils Absolute: 0 10*3/uL (ref 0.0–0.5)
Eosinophils Relative: 1 %
HCT: 40.7 % (ref 39.0–52.0)
Hemoglobin: 13.6 g/dL (ref 13.0–17.0)
Immature Granulocytes: 0 %
Lymphocytes Relative: 4 %
Lymphs Abs: 0.2 10*3/uL — ABNORMAL LOW (ref 0.7–4.0)
MCH: 32.5 pg (ref 26.0–34.0)
MCHC: 33.4 g/dL (ref 30.0–36.0)
MCV: 97.1 fL (ref 80.0–100.0)
Monocytes Absolute: 0 10*3/uL — ABNORMAL LOW (ref 0.1–1.0)
Monocytes Relative: 1 %
Neutro Abs: 3.4 10*3/uL (ref 1.7–7.7)
Neutrophils Relative %: 94 %
Platelets: 128 10*3/uL — ABNORMAL LOW (ref 150–400)
RBC: 4.19 MIL/uL — ABNORMAL LOW (ref 4.22–5.81)
RDW: 13.2 % (ref 11.5–15.5)
WBC: 3.6 10*3/uL — ABNORMAL LOW (ref 4.0–10.5)
nRBC: 0 % (ref 0.0–0.2)

## 2020-06-07 LAB — COMPREHENSIVE METABOLIC PANEL
ALT: 162 U/L — ABNORMAL HIGH (ref 0–44)
AST: 133 U/L — ABNORMAL HIGH (ref 15–41)
Albumin: 3.7 g/dL (ref 3.5–5.0)
Alkaline Phosphatase: 79 U/L (ref 38–126)
Anion gap: 15 (ref 5–15)
BUN: 28 mg/dL — ABNORMAL HIGH (ref 8–23)
CO2: 19 mmol/L — ABNORMAL LOW (ref 22–32)
Calcium: 8.5 mg/dL — ABNORMAL LOW (ref 8.9–10.3)
Chloride: 102 mmol/L (ref 98–111)
Creatinine, Ser: 2.08 mg/dL — ABNORMAL HIGH (ref 0.61–1.24)
GFR, Estimated: 33 mL/min — ABNORMAL LOW (ref >60)
Glucose, Bld: 239 mg/dL — ABNORMAL HIGH (ref 70–99)
Potassium: 3.6 mmol/L (ref 3.5–5.1)
Sodium: 136 mmol/L (ref 135–145)
Total Bilirubin: 6.4 mg/dL — ABNORMAL HIGH (ref 0.3–1.2)
Total Protein: 7.5 g/dL (ref 6.5–8.1)

## 2020-06-07 LAB — RESPIRATORY PANEL BY RT PCR (FLU A&B, COVID)
Influenza A by PCR: NEGATIVE
Influenza B by PCR: NEGATIVE
SARS Coronavirus 2 by RT PCR: NEGATIVE

## 2020-06-07 LAB — BLOOD CULTURE ID PANEL (REFLEXED) - BCID2

## 2020-06-07 LAB — LACTIC ACID, PLASMA
Lactic Acid, Venous: 1.5 mmol/L (ref 0.5–1.9)
Lactic Acid, Venous: 1 mmol/L (ref 0.5–1.9)

## 2020-06-07 LAB — URINALYSIS, ROUTINE W REFLEX MICROSCOPIC
Bacteria, UA: NONE SEEN
Bilirubin Urine: NEGATIVE
Glucose, UA: >=500 mg/dL — AB
Ketones, ur: 5 mg/dL — AB
Leukocytes,Ua: NEGATIVE
Nitrite: NEGATIVE
Protein, ur: 100 mg/dL — AB
Specific Gravity, Urine: 1.014 (ref 1.005–1.030)
pH: 5 (ref 5.0–8.0)

## 2020-06-07 LAB — TROPONIN I (HIGH SENSITIVITY)
Troponin I (High Sensitivity): 37 ng/L — ABNORMAL HIGH (ref <18)
Troponin I (High Sensitivity): 308 ng/L (ref <18)

## 2020-06-07 LAB — PROTIME-INR
INR: 1.4 — ABNORMAL HIGH (ref 0.8–1.2)
Prothrombin Time: 16.2 seconds — ABNORMAL HIGH (ref 11.4–15.2)

## 2020-06-07 LAB — APTT: aPTT: 29 seconds (ref 24–36)

## 2020-06-07 LAB — GLUCOSE, CAPILLARY
Glucose-Capillary: 178 mg/dL — ABNORMAL HIGH (ref 70–99)
Glucose-Capillary: 141 mg/dL — ABNORMAL HIGH (ref 70–99)

## 2020-06-07 LAB — HEPATITIS PANEL, ACUTE
HCV Ab: NONREACTIVE
Hep A IgM: NONREACTIVE
Hep B C IgM: NONREACTIVE
Hepatitis B Surface Ag: NONREACTIVE

## 2020-06-07 LAB — CBG MONITORING, ED: Glucose-Capillary: 150 mg/dL — ABNORMAL HIGH (ref 70–99)

## 2020-06-07 LAB — BRAIN NATRIURETIC PEPTIDE: B Natriuretic Peptide: 162.2 pg/mL — ABNORMAL HIGH (ref 0.0–100.0)

## 2020-06-07 LAB — LIPASE, BLOOD: Lipase: 66 U/L — ABNORMAL HIGH (ref 11–51)

## 2020-06-07 MED ORDER — IOHEXOL 9 MG/ML PO SOLN
ORAL | Status: AC
  Filled 2020-06-07: qty 1000

## 2020-06-07 MED ORDER — ONDANSETRON HCL 4 MG PO TABS: 4.0000 mg | ORAL_TABLET | Freq: Four times a day (QID) | ORAL | Status: DC | PRN

## 2020-06-07 MED ORDER — RISAQUAD PO CAPS
1.0000 | ORAL_CAPSULE | Freq: Every day | ORAL | Status: DC
  Administered 2020-06-08 – 2020-06-18 (×10): 1 via ORAL
  Filled 2020-06-07 (×10): qty 1

## 2020-06-07 MED ORDER — LACTATED RINGERS IV SOLN: INTRAVENOUS | Status: DC

## 2020-06-07 MED ORDER — TAMSULOSIN HCL 0.4 MG PO CAPS
0.4000 mg | ORAL_CAPSULE | Freq: Every day | ORAL | Status: DC
  Administered 2020-06-07 – 2020-06-18 (×10): 0.4 mg via ORAL
  Filled 2020-06-07 (×11): qty 1

## 2020-06-07 MED ORDER — DORAVIRINE 100 MG PO TABS
100.0000 mg | ORAL_TABLET | Freq: Every morning | ORAL | Status: DC
  Administered 2020-06-07 – 2020-06-18 (×11): 100 mg via ORAL
  Filled 2020-06-07 (×13): qty 1

## 2020-06-07 MED ORDER — POTASSIUM CHLORIDE CRYS ER 20 MEQ PO TBCR
20.0000 meq | EXTENDED_RELEASE_TABLET | Freq: Every day | ORAL | Status: DC
  Administered 2020-06-07 – 2020-06-18 (×11): 20 meq via ORAL
  Filled 2020-06-07 (×11): qty 1

## 2020-06-07 MED ORDER — IOHEXOL 9 MG/ML PO SOLN: 500.0000 mL | ORAL | Status: AC

## 2020-06-07 MED ORDER — DOLUTEGRAVIR SODIUM 50 MG PO TABS
50.0000 mg | ORAL_TABLET | Freq: Every morning | ORAL | Status: DC
  Administered 2020-06-07 – 2020-06-18 (×11): 50 mg via ORAL
  Filled 2020-06-07 (×11): qty 1

## 2020-06-07 MED ORDER — PIPERACILLIN-TAZOBACTAM 3.375 G IVPB 30 MIN
3.3750 g | Freq: Once | INTRAVENOUS | Status: AC
  Administered 2020-06-07: 3.375 g via INTRAVENOUS
  Filled 2020-06-07: qty 50

## 2020-06-07 MED ORDER — B COMPLEX-C PO TABS
1.0000 | ORAL_TABLET | Freq: Every day | ORAL | Status: DC
  Administered 2020-06-08 – 2020-06-18 (×9): 1 via ORAL
  Filled 2020-06-07 (×10): qty 1

## 2020-06-07 MED ORDER — ASPIRIN EC 81 MG PO TBEC
81.0000 mg | DELAYED_RELEASE_TABLET | Freq: Every day | ORAL | Status: DC
  Administered 2020-06-07 – 2020-06-18 (×11): 81 mg via ORAL
  Filled 2020-06-07 (×11): qty 1

## 2020-06-07 MED ORDER — ACETAMINOPHEN 325 MG PO TABS
650.0000 mg | ORAL_TABLET | Freq: Once | ORAL | Status: AC
  Administered 2020-06-07: 650 mg via ORAL
  Filled 2020-06-07: qty 2

## 2020-06-07 MED ORDER — SODIUM CHLORIDE 0.9 % IV BOLUS (SEPSIS)
1000.0000 mL | Freq: Once | INTRAVENOUS | Status: AC
  Administered 2020-06-07: 1000 mL via INTRAVENOUS

## 2020-06-07 MED ORDER — SODIUM CHLORIDE 0.9 % IV SOLN
2.0000 g | INTRAVENOUS | Status: DC
  Administered 2020-06-08 – 2020-06-14 (×6): 2 g via INTRAVENOUS
  Filled 2020-06-07 (×6): qty 2
  Filled 2020-06-07: qty 20

## 2020-06-07 MED ORDER — ISOSORBIDE MONONITRATE ER 30 MG PO TB24
30.0000 mg | ORAL_TABLET | Freq: Every day | ORAL | Status: DC
  Administered 2020-06-07 – 2020-06-18 (×11): 30 mg via ORAL
  Filled 2020-06-07 (×11): qty 1

## 2020-06-07 MED ORDER — ONDANSETRON HCL 4 MG/2ML IJ SOLN
4.0000 mg | Freq: Once | INTRAMUSCULAR | Status: AC
  Administered 2020-06-07: 4 mg via INTRAVENOUS
  Filled 2020-06-07: qty 2

## 2020-06-07 MED ORDER — ALUM & MAG HYDROXIDE-SIMETH 200-200-20 MG/5ML PO SUSP
30.0000 mL | Freq: Once | ORAL | Status: AC
  Administered 2020-06-07: 30 mL via ORAL
  Filled 2020-06-07: qty 30

## 2020-06-07 MED ORDER — METRONIDAZOLE IN NACL 5-0.79 MG/ML-% IV SOLN
500.0000 mg | Freq: Three times a day (TID) | INTRAVENOUS | Status: DC
  Administered 2020-06-07 (×2): 500 mg via INTRAVENOUS
  Filled 2020-06-07 (×2): qty 100

## 2020-06-07 MED ORDER — NITROGLYCERIN 0.4 MG SL SUBL: 0.4000 mg | SUBLINGUAL_TABLET | SUBLINGUAL | Status: DC | PRN

## 2020-06-07 MED ORDER — SODIUM BICARBONATE 650 MG PO TABS
650.0000 mg | ORAL_TABLET | Freq: Three times a day (TID) | ORAL | Status: DC
  Administered 2020-06-07 – 2020-06-18 (×32): 650 mg via ORAL
  Filled 2020-06-07 (×32): qty 1

## 2020-06-07 MED ORDER — SODIUM CHLORIDE 0.9 % IV SOLN
1000.0000 mL | INTRAVENOUS | Status: DC
  Administered 2020-06-07: 1000 mL via INTRAVENOUS

## 2020-06-07 MED ORDER — METOPROLOL TARTRATE 12.5 MG HALF TABLET
12.5000 mg | ORAL_TABLET | Freq: Two times a day (BID) | ORAL | Status: DC
  Administered 2020-06-07 – 2020-06-18 (×22): 12.5 mg via ORAL
  Filled 2020-06-07 (×22): qty 1

## 2020-06-07 MED ORDER — LIDOCAINE VISCOUS HCL 2 % MT SOLN
15.0000 mL | Freq: Once | OROMUCOSAL | Status: AC
  Administered 2020-06-07: 15 mL via ORAL
  Filled 2020-06-07: qty 15

## 2020-06-07 MED ORDER — PANTOPRAZOLE SODIUM 40 MG PO TBEC
40.0000 mg | DELAYED_RELEASE_TABLET | Freq: Every day | ORAL | Status: DC
  Administered 2020-06-07 – 2020-06-18 (×11): 40 mg via ORAL
  Filled 2020-06-07 (×11): qty 1

## 2020-06-07 MED ORDER — SODIUM CHLORIDE 0.9 % IV SOLN
1.0000 g | Freq: Once | INTRAVENOUS | Status: AC
  Administered 2020-06-07: 1 g via INTRAVENOUS
  Filled 2020-06-07: qty 10

## 2020-06-07 MED ORDER — VITAMIN D 25 MCG (1000 UNIT) PO TABS
2000.0000 [IU] | ORAL_TABLET | Freq: Every morning | ORAL | Status: DC
  Administered 2020-06-07 – 2020-06-18 (×11): 2000 [IU] via ORAL
  Filled 2020-06-07 (×13): qty 2

## 2020-06-07 MED ORDER — INSULIN ASPART 100 UNIT/ML ~~LOC~~ SOLN
0.0000 [IU] | Freq: Three times a day (TID) | SUBCUTANEOUS | Status: DC
  Administered 2020-06-07: 3 [IU] via SUBCUTANEOUS
  Administered 2020-06-08 (×3): 2 [IU] via SUBCUTANEOUS
  Administered 2020-06-09 (×3): 3 [IU] via SUBCUTANEOUS
  Administered 2020-06-10: 5 [IU] via SUBCUTANEOUS
  Administered 2020-06-10: 0 [IU] via SUBCUTANEOUS
  Administered 2020-06-10: 2 [IU] via SUBCUTANEOUS
  Administered 2020-06-11: 3 [IU] via SUBCUTANEOUS
  Administered 2020-06-11: 8 [IU] via SUBCUTANEOUS
  Administered 2020-06-12 – 2020-06-13 (×4): 2 [IU] via SUBCUTANEOUS
  Administered 2020-06-14: 8 [IU] via SUBCUTANEOUS
  Administered 2020-06-15: 5 [IU] via SUBCUTANEOUS
  Administered 2020-06-15: 15 [IU] via SUBCUTANEOUS
  Administered 2020-06-16 (×2): 3 [IU] via SUBCUTANEOUS
  Administered 2020-06-17 (×3): 2 [IU] via SUBCUTANEOUS

## 2020-06-07 MED ORDER — ONDANSETRON HCL 4 MG/2ML IJ SOLN
4.0000 mg | Freq: Four times a day (QID) | INTRAMUSCULAR | Status: DC | PRN
  Administered 2020-06-16: 4 mg via INTRAVENOUS
  Filled 2020-06-07: qty 2

## 2020-06-07 MED ORDER — VALACYCLOVIR HCL 500 MG PO TABS
500.0000 mg | ORAL_TABLET | Freq: Every morning | ORAL | Status: DC
  Administered 2020-06-07 – 2020-06-18 (×11): 500 mg via ORAL
  Filled 2020-06-07 (×12): qty 1

## 2020-06-07 NOTE — ED Provider Notes (Signed)
[name] Provider Note  CSN: [id number] Arrival date & time: 06/07/20 0356  Chief Complaint(s) Abdominal Pain  HPI [name] is a 74 y.o. male     The history is provided by the patient.  Abdominal Pain Pain location:  Epigastric and LUQ Pain quality: sharp   Pain radiates to:  Does not radiate Pain severity:  Severe Onset quality:  Sudden Duration:  4 hours Timing:  Constant Progression:  Unchanged Chronicity:  New Context comment:  Woke him from sleep. Relieved by:  Nothing Worsened by:  Deep breathing Ineffective treatments:  None tried Associated symptoms: fatigue and shortness of breath (due to pain)   Associated symptoms: no chest pain, no cough, no diarrhea, no fever, no nausea and no vomiting     Past Medical History Past Medical History:  Diagnosis Date  . Absolute anemia 05/28/2014  . Aortic stenosis 09/17/2015  . Arthritis   . Ascites 11/18/2013  . BELLS PALSY 07/19/2010   Qualifier: Diagnosis of  By: [name], [name]    . CAD (coronary artery disease)    a. s/p CABG in 2003 b. s/p PCI to SVG-PDA in 07/2015 c. 11/2016: cath showing severe native CAD with patent LIMA-LAD and SVG-D1 with 80% stenosis of SVG-OM1-OM2. Initially medical management was recommended --> presented with recurrent angina --> s/p Synergy DES to proximal body of SVG-OM1-OM2, POBA to distal graft.   Marland Kitchen CAD- S/P PCI SVG-OM1 12/01/16 05/12/2006   Qualifier: Diagnosis of  By: [name], [name]    . Carotid bruit 07/10/2015  . Chest pain 11/22/2016  . Chronic kidney disease   . Chronic renal insufficiency, stage III (moderate) ([place]) 12/02/2016  . Constipation 05/28/2014  . COPD 07/20/2008   Qualifier: Diagnosis of  By: [name], [name]   . DEPRESSION 09/03/2006   Qualifier: Diagnosis of  By: [name], [name]    . Dermatitis 11/27/2012  . Diabetes mellitus   . Diarrhea 11/20/2013  . DM (diabetes mellitus), type 2 with ophthalmic complications ([place])  2/58/5277   Qualifier: Diagnosis of  By: [name], [name]    . Dry eye syndrome 12/20/2010  . Epicondylitis 06/05/2013   right  . Erectile dysfunction 01/01/2012  . Essential hypertension 05/12/2006   Qualifier: Diagnosis of  By: [name], [name]    . GENITAL HERPES 05/03/2009   Qualifier: Diagnosis of  By: [name], [name]    . GERD 09/03/2006   Qualifier: Diagnosis of  By: [name], [name]    . GERD (gastroesophageal reflux disease)   . HEARING LOSS, SENSORINEURAL 05/12/2006   Qualifier: Diagnosis of  By: [name], [name]    . HEMATOCHEZIA 04/18/2008   Annotation: 9/09 during bout of constipation Qualifier: Diagnosis of  By: [name], [name]    . HIP PAIN, BILATERAL 07/17/2008   Qualifier: Diagnosis of  By: [name], [name]   . History of depression   . History of kidney stones   . HIV (human immunodeficiency virus infection) ([place]) 1991   on meds since initial dx.   Marland Kitchen HLD (hyperlipidemia) 11/19/2013  . Human immunodeficiency virus (HIV) disease ([place]) 05/12/2006   Qualifier: Diagnosis of  By: [name], [name]    . Hx of CABG 09/03/2006   Annotation: 2003 Qualifier: Diagnosis of  By: [name], [name]    . Hyperkalemia 03/23/2014  . Hyperlipidemia   . Hypertension   . Hyponatremia 03/23/2014  . INGUINAL LYMPHADENOPATHY, RIGHT 04/03/2009   Qualifier: Diagnosis of  By: [name], [name]    . Keratoma 01/24/2015  . KNEE PAIN, BILATERAL 07/17/2008   Qualifier: Diagnosis of  By: [name], [name]   . Lesion of breast 07/21/2015  . Lipodystrophy 12/20/2010  . Memory loss 09/18/2008   Qualifier: Diagnosis of  By: [name], [name]   . Metatarsal deformity 01/24/2015  . Nasal abscess 12/04/2015  . Night sweats 07/06/2012  . Nocturia 03/06/2013  . NSTEMI (non-ST elevated myocardial infarction) ([place]) 11/29/2016  . Pain in joint, ankle and foot 01/19/2015  . Pancreatitis 11/2013   attributed to HIV meds.   . Pedal edema 05/21/2014  . PERIPHERAL VASCULAR DISEASE 07/20/2008   Qualifier: Diagnosis of   By: [name], [name]   . Posterior cervical lymphadenopathy 03/30/2014  . Protein-calorie malnutrition, severe ([place]) 03/23/2014  . SHINGLES, HX OF 05/03/2009   Annotation: R leg Qualifier: Diagnosis of  By: [name], [name]    . STEMI (ST elevation myocardial infarction) ([place]) 07/29/2015  . Unstable angina ([place]) 11/24/2016  . WEIGHT LOSS, ABNORMAL 04/03/2009   Qualifier: Diagnosis of  By: [name], [name]     Patient Active Problem List   Diagnosis Date Noted  . Benign prostatic hyperplasia without lower urinary tract symptoms 05/27/2020  . Chronic diastolic CHF (congestive heart failure) ([place]) 05/27/2020  . Impingement syndrome of left shoulder region 07/08/2018  . Stiffness of left shoulder joint 07/08/2018  . Pain in joint of left shoulder 07/08/2018  . Chronic kidney disease, stage 3b ([place]) 12/02/2016  . NSTEMI (non-ST elevated myocardial infarction) ([place]) 11/29/2016  . Neck mass 06/16/2016  . Aortic stenosis 09/17/2015  . Lesion of breast 07/21/2015  . Carotid bruit 07/10/2015  . Metatarsal deformity 01/24/2015  . Keratoma 01/24/2015  . Pain in joint, ankle and foot 01/19/2015  . Absolute anemia 05/28/2014  . Pedal edema 05/21/2014  . Posterior cervical lymphadenopathy 03/30/2014  . Protein-calorie malnutrition, severe ([place]) 03/23/2014  . Diarrhea 11/20/2013  . Mixed hyperlipidemia 11/19/2013  . Ascites 11/18/2013  . Pancreatitis 11/15/2013  . Epicondylitis 06/05/2013  . Nocturia 03/06/2013  . Dermatitis 11/27/2012  . Erectile dysfunction 01/01/2012  . Lipodystrophy 12/20/2010  . Dry eye syndrome 12/20/2010  . BELLS PALSY 07/19/2010  . GENITAL HERPES 05/03/2009  . SHINGLES, HX OF 05/03/2009  . WEIGHT LOSS, ABNORMAL 04/03/2009  . INGUINAL LYMPHADENOPATHY, RIGHT 04/03/2009  . MEMORY LOSS 09/18/2008  . PERIPHERAL VASCULAR DISEASE 07/20/2008  . Chronic obstructive pulmonary disease ([place]) 07/20/2008  . HIP PAIN, BILATERAL 07/17/2008  . KNEE PAIN, BILATERAL 07/17/2008   . NEPHROLITHIASIS, HX OF 02/22/2008  . DM (diabetes mellitus), type 2 with ophthalmic complications ([place]) 52/77/8242  . Depression, recurrent ([place]) 09/03/2006  . GERD without esophagitis 09/03/2006  . Hx of CABG 09/03/2006  . Type 2 diabetes mellitus with stage 3b chronic kidney disease, without long-term current use of insulin ([place]) 09/03/2006  . Human immunodeficiency virus (HIV) disease ([place]) 05/12/2006  . HEARING LOSS, SENSORINEURAL 05/12/2006  . Essential hypertension 05/12/2006  . CAD- S/P PCI SVG-OM1 12/01/16 05/12/2006  . Coronary artery disease 05/12/2006   Home Medication(s) Prior to Admission medications   Medication Sig Start Date End Date Taking? Authorizing Provider  acetaminophen (TYLENOL) 325 MG tablet Take 2 tablets (650 mg total) by mouth every 4 (four) hours as needed for headache or mild pain. 12/02/16   Erlene Quan, PA-C  aspirin 81 MG chewable tablet Chew 81 mg by mouth at bedtime.     [provider]  atorvastatin (LIPITOR)  40 MG tablet Take 1 tablet (40 mg total) by mouth every evening. 05/29/20   Geradine Girt, DO  b complex vitamins tablet Take 1 tablet by mouth daily. Patient taking differently: Take 1 tablet by mouth daily with breakfast.  09/25/16   Mosie Lukes, MD  Cholecalciferol (VITAMIN D3) 50 MCG (2000 UT) TABS Take 2,000 Units by mouth in the morning.    [provider]  dolutegravir (TIVICAY) 50 MG tablet TAKE 1 TABLET BY MOUTH DAILY with Pifeltro Patient taking differently: Take 50 mg by mouth See admin instructions. Take 50 mg by mouth in the morning (with Pifeltro) 05/18/20   Michel Bickers, MD  doravirine (PIFELTRO) 100 MG TABS tablet TAKE 1 TABLET(100 MG) BY MOUTH DAILY with Tivicay Patient taking differently: Take 100 mg by mouth See admin instructions. Take 100 mg by mouth in the morning (with Tivicay) 05/18/20   Michel Bickers, MD  fenofibrate 54 MG tablet TAKE 1 TABLET(54 MG) BY MOUTH DAILY Patient taking differently:  Take 54 mg by mouth at bedtime.  09/28/19   Almyra Deforest, PA  furosemide (LASIX) 20 MG tablet Take 1 tablet (20 mg total) by mouth daily. 05/29/20   Geradine Girt, DO  glucose blood (FREESTYLE TEST STRIPS) test strip USE TO CHECK BLOOD SUGAR THREE TIMES DAILY 11/18/19   Elayne Snare, MD  isosorbide mononitrate (IMDUR) 30 MG 24 hr tablet Take 1 tablet (30 mg total) by mouth daily. 01/16/20   Lelon Perla, MD  Lancets (FREESTYLE) lancets Use as directed three times a day to check blood sugar.  DX E11.9 11/03/17   Elayne Snare, MD  losartan (COZAAR) 25 MG tablet Take 1 tablet (25 mg total) by mouth daily. 05/29/20   Geradine Girt, DO  metoprolol tartrate (LOPRESSOR) 25 MG tablet Take 0.5 tablets (12.5 mg total) by mouth 2 (two) times daily. 05/29/20   Geradine Girt, DO  nitroGLYCERIN (NITROSTAT) 0.4 MG SL tablet PLACE 1 TABLET UNDER THE TONGUE EVERY 5 MINUTES AS NEEDED FOR CHEST PAIN Patient taking differently: Place 0.4 mg under the tongue every 5 (five) minutes as needed for chest pain.  03/19/20   Lelon Perla, MD  pantoprazole (PROTONIX) 40 MG tablet TAKE 1 TABLET(40 MG) BY MOUTH DAILY Patient taking differently: Take 40 mg by mouth daily before breakfast.  10/12/19   Lelon Perla, MD  potassium chloride SA (KLOR-CON) 20 MEQ tablet TAKE 1 TABLET(20 MEQ) daily 05/29/20   Geradine Girt, DO  Probiotic Product (PROBIOTIC DAILY) CAPS Take 1 by mouth daily Patient taking differently: Take 1 capsule by mouth daily with breakfast.  09/25/16   Mosie Lukes, MD  repaglinide (PRANDIN) 2 MG tablet Take 2-4 mg by mouth See admin instructions. Take 4 mg by mouth in the morning before breakfast and 2 mg before lunch and dinner/evening meal    [provider]  sodium bicarbonate 650 MG tablet Take 650 mg by mouth 3 (three) times daily.    [provider]  tamsulosin (FLOMAX) 0.4 MG CAPS capsule Take 0.4 mg by mouth in the morning and at bedtime.  05/19/18   [provider]   ticagrelor (BRILINTA) 90 MG TABS tablet Take 1 tablet (90 mg total) by mouth 2 (two) times daily. 05/29/20   Geradine Girt, DO  valACYclovir (VALTREX) 500 MG tablet TAKE 1 TABLET BY MOUTH DAILY Patient taking differently: Take 500 mg by mouth in the morning.  03/27/20   Michel Bickers, MD  VASCEPA  1 g capsule TAKE 2 CAPSULES(2 GRAMS) BY MOUTH TWICE DAILY Patient taking differently: Take 2 g by mouth 2 (two) times daily.  03/16/20   Elayne Snare, MD                                                                                                                                    Past Surgical History Past Surgical History:  Procedure Laterality Date  . CARDIAC CATHETERIZATION N/A 07/29/2015   Procedure: Left Heart Cath and Coronary Angiography;  Surgeon: [name], MD;  Location: [facility];  Service: Cardiovascular;  Laterality: N/A;  . CARDIAC CATHETERIZATION N/A 07/29/2015   Procedure: Coronary Stent Intervention;  Surgeon: [name], MD;  Location: [facility];  Service: Cardiovascular;  Laterality: N/A;  . COLONOSCOPY  12/02/2019  . CORONARY ARTERY BYPASS GRAFT  05/2002  . CORONARY BALLOON ANGIOPLASTY N/A 05/28/2020   Procedure: CORONARY BALLOON ANGIOPLASTY;  Surgeon: Nelva Bush, MD;  Location: [facility];  Service: Cardiovascular;  Laterality: N/A;  . CORONARY STENT INTERVENTION N/A 12/01/2016   Procedure: Coronary Stent Intervention;  Surgeon: [name], MD;  Location: [facility];  Service: Cardiovascular;  Laterality: N/A;  . CORONARY STENT INTERVENTION N/A 10/21/2017   Procedure: CORONARY STENT INTERVENTION;  Surgeon: Jettie Booze, MD;  Location: [facility];  Service: Cardiovascular;  Laterality: N/A;  . LEFT HEART CATH AND CORS/GRAFTS ANGIOGRAPHY N/A 11/24/2016   Procedure: Left Heart Cath and Cors/Grafts Angiography;  Surgeon: Nelva Bush, MD;  Location: [facility];  Service: Cardiovascular;  Laterality:  N/A;  . LEFT HEART CATH AND CORS/GRAFTS ANGIOGRAPHY N/A 12/01/2016   Procedure: Left Heart Cath and Cors/Grafts Angiography;  Surgeon: [name], MD;  Location: [facility];  Service: Cardiovascular;  Laterality: N/A;  . LEFT HEART CATH AND CORS/GRAFTS ANGIOGRAPHY N/A 10/21/2017   Procedure: LEFT HEART CATH AND CORS/GRAFTS ANGIOGRAPHY;  Surgeon: Jettie Booze, MD;  Location: [facility];  Service: Cardiovascular;  Laterality: N/A;  . LEFT HEART CATH AND CORS/GRAFTS ANGIOGRAPHY N/A 05/28/2020   Procedure: LEFT HEART CATH AND CORS/GRAFTS ANGIOGRAPHY;  Surgeon: Nelva Bush, MD;  Location: [facility];  Service: Cardiovascular;  Laterality: N/A;  . VASECTOMY     Family History Family History  Problem Relation Age of Onset  . Hyperlipidemia Mother   . Diabetes Mother        paternal grandparents/1 brother  . Hyperlipidemia Father   . Hypertension Father        paternal grandmother/3 brothers/1 sister  . Arthritis Other        mother/father/paternal grandparents  . Breast cancer Maternal Aunt        paternal aunt  . Lung cancer Maternal Aunt   . Heart disease Other        parents/maternal grandparents/ 2 brothers  . Stroke Paternal Grandmother   .  Mental retardation Sister   . Colon cancer Neg Hx   . Esophageal cancer Neg Hx   . Pancreatic cancer Neg Hx   . Stomach cancer Neg Hx   . Liver disease Neg Hx   . Colon polyps Neg Hx   . Rectal cancer Neg Hx     Social History Social History   Tobacco Use  . Smoking status: Never Smoker  . Smokeless tobacco: Never Used  Vaping Use  . Vaping Use: Never used  Substance Use Topics  . Alcohol use: Not Currently    Alcohol/week: 0.0 standard drinks    Comment: rare  . Drug use: No   Allergies Patient has no known allergies.  Review of Systems Review of Systems  Constitutional: Positive for fatigue. Negative for fever.  Respiratory: Positive for shortness of breath (due to pain). Negative for  cough.   Cardiovascular: Negative for chest pain.  Gastrointestinal: Positive for abdominal pain. Negative for diarrhea, nausea and vomiting.   All other systems are reviewed and are negative for acute change except as noted in the HPI  Physical Exam Vital Signs  I have reviewed the triage vital signs BP (!) 136/99   Pulse 92   Temp 98.3 F (36.8 C)   Resp 16   SpO2 100%   Physical Exam Vitals reviewed.  Constitutional:      General: He is not in acute distress.    Appearance: He is well-developed. He is not diaphoretic.  HENT:     Head: Normocephalic and atraumatic.     Nose: Nose normal.  Eyes:     General: No scleral icterus.       Right eye: No discharge.        Left eye: No discharge.     Conjunctiva/sclera: Conjunctivae normal.     Pupils: Pupils are equal, round, and reactive to light.  Cardiovascular:     Rate and Rhythm: Normal rate and regular rhythm.     Heart sounds: No murmur heard.  No friction rub. No gallop.   Pulmonary:     Effort: Pulmonary effort is normal. No respiratory distress.     Breath sounds: Normal breath sounds. No stridor. No rales.  Abdominal:     General: There is no distension.     Palpations: Abdomen is soft.     Tenderness: There is abdominal tenderness (mild discomfort) in the right upper quadrant and epigastric area.  Musculoskeletal:        General: No tenderness.     Cervical back: Normal range of motion and neck supple.  Skin:    General: Skin is warm and dry.     Findings: No erythema or rash.  Neurological:     Mental Status: He is alert and oriented to person, place, and time.     ED Results and Treatments Labs (all labs ordered are listed, but only abnormal results are displayed) Labs Reviewed  CBC WITH DIFFERENTIAL/PLATELET - Abnormal; Notable for the following components:      Result Value   WBC 3.6 (*)    RBC 4.19 (*)    Platelets 128 (*)    Lymphs Abs 0.2 (*)    Monocytes Absolute 0.0 (*)    All other  components within normal limits  COMPREHENSIVE METABOLIC PANEL - Abnormal; Notable for the following components:   CO2 19 (*)    Glucose, Bld 239 (*)    BUN 28 (*)    Creatinine, Ser 2.08 (*)    Calcium  8.5 (*)    AST 133 (*)    ALT 162 (*)    Total Bilirubin 6.4 (*)    GFR, Estimated 33 (*)    All other components within normal limits  LIPASE, BLOOD - Abnormal; Notable for the following components:   Lipase 66 (*)    All other components within normal limits  URINALYSIS, ROUTINE W REFLEX MICROSCOPIC - Abnormal; Notable for the following components:   Color, Urine AMBER (*)    Glucose, UA >=500 (*)    Hgb urine dipstick SMALL (*)    Ketones, ur 5 (*)    Protein, ur 100 (*)    All other components within normal limits  BRAIN NATRIURETIC PEPTIDE - Abnormal; Notable for the following components:   B Natriuretic Peptide 162.2 (*)    All other components within normal limits  PROTIME-INR - Abnormal; Notable for the following components:   Prothrombin Time 16.2 (*)    INR 1.4 (*)    All other components within normal limits  TROPONIN I (HIGH SENSITIVITY) - Abnormal; Notable for the following components:   Troponin I (High Sensitivity) 37 (*)    All other components within normal limits  RESPIRATORY PANEL BY RT PCR (FLU A&B, COVID)  CULTURE, BLOOD (SINGLE)  URINE CULTURE  LACTIC ACID, PLASMA  APTT  LACTIC ACID, PLASMA  HEPATITIS PANEL, ACUTE  TROPONIN I (HIGH SENSITIVITY)                                                                                                                         EKG  EKG Interpretation  Date/Time:  Thursday June 07 2020 04:06:10 EDT Ventricular Rate:  91 PR Interval:    QRS Duration: 102 QT Interval:  428 QTC Calculation: 527 R Axis:   6 Text Interpretation: Sinus rhythm Borderline prolonged PR interval LVH with secondary repolarization abnormality Inferior infarct, old Prolonged QT interval Otherwise no significant change Confirmed by  Addison Lank [id number]) on 06/07/2020 5:00:02 AM      Radiology DG Chest 2 View  Result Date: 06/07/2020 CLINICAL DATA:  Epigastric pain. EXAM: CHEST - 2 VIEW COMPARISON:  05/27/2020. FINDINGS: Prior CABG. Stable cardiomegaly. Low lung volumes with mild bibasilar atelectasis. No pleural effusion or pneumothorax. Degenerative change thoracic spine. IMPRESSION: 1. Prior CABG. Stable cardiomegaly. 2. Low lung volumes with mild bibasilar atelectasis. Electronically Signed   By: Marcello Moores  Register   On: 06/07/2020 05:21    Pertinent labs & imaging results that were available during my care of the patient were reviewed by me and considered in my medical decision making (see chart for details).  Medications Ordered in ED Medications  sodium chloride 0.9 % bolus 1,000 mL (1,000 mLs Intravenous New Bag/Given 06/07/20 0611)    Followed by  0.9 %  sodium chloride infusion (1,000 mLs Intravenous New Bag/Given 06/07/20 0612)  iohexol (OMNIPAQUE) 9 MG/ML oral solution (has no administration in time range)  iohexol (OMNIPAQUE) 9 MG/ML oral solution 500 mL (has no  administration in time range)  piperacillin-tazobactam (ZOSYN) IVPB 3.375 g (has no administration in time range)  ondansetron (ZOFRAN) injection 4 mg (4 mg Intravenous Given 06/07/20 0517)  alum & mag hydroxide-simeth (MAALOX/MYLANTA) 200-200-20 MG/5ML suspension 30 mL (30 mLs Oral Given 06/07/20 0518)    And  lidocaine (XYLOCAINE) 2 % viscous mouth solution 15 mL (15 mLs Oral Given 06/07/20 0518)  cefTRIAXone (ROCEPHIN) 1 g in sodium chloride 0.9 % 100 mL IVPB (1 g Intravenous New Bag/Given 06/07/20 0635)  acetaminophen (TYLENOL) tablet 650 mg (650 mg Oral Given 06/07/20 5053)                                                                                                                                    Procedures .1-3 Lead EKG Interpretation Performed by: Fatima Blank, MD Authorized by: Fatima Blank, MD     Interpretation:  abnormal     ECG rate:  110   ECG rate assessment: tachycardic     Rhythm: sinus tachycardia     Ectopy: none     Conduction: normal   .Critical Care Performed by: Fatima Blank, MD Authorized by: Fatima Blank, MD    CRITICAL CARE Performed by: Grayce Sessions Royanne Warshaw Total critical care time: 45 minutes Critical care time was exclusive of separately billable procedures and treating other patients. Critical care was necessary to treat or prevent imminent or life-threatening deterioration. Critical care was time spent personally by me on the following activities: development of treatment plan with patient and/or surrogate as well as nursing, discussions with consultants, evaluation of patient's response to treatment, examination of patient, obtaining history from patient or surrogate, ordering and performing treatments and interventions, ordering and review of laboratory studies, ordering and review of radiographic studies, pulse oximetry and re-evaluation of patient's condition.    (including critical care time)  Medical Decision Making / ED Course I have reviewed the nursing notes for this encounter and the patient's prior records (if available in EHR or on provided paperwork).   ASH MCELWAIN was evaluated in Emergency Department on 06/07/2020 for the symptoms described in the history of present illness. He was evaluated in the context of the global COVID-19 pandemic, which necessitated consideration that the patient might be at risk for infection with the SARS-CoV-2 virus that causes COVID-19. Institutional protocols and algorithms that pertain to the evaluation of patients at risk for COVID-19 are in a state of rapid change based on information released by regulatory bodies including the CDC and federal and state organizations. These policies and algorithms were followed during the patient's care in the ED.    Clinical Course as of Jun 08 715  Thu Jun 07, 2020   0500 Epigastric abd pain. Minimal discomfort to palpation. Recent PCI for NSTEMI  EKG unchanged from prior.  Reports pain is much different than recent MI. Will obtain cardiac markers to trend level.  In the  interim will provide GI cocktail in case this is GI related.   [PC]  0530 Chest x-ray without evidence suggestive of pneumonia, pneumothorax, pneumomediastinum.  No abnormal contour of the mediastinum to suggest dissection. No evidence of acute injuries.    [PC]  [id number] Patient reported near complete resolution of his abdominal pain following GI cocktail.  Unlikely PE. Not consistent with dissection.  On reassessment, I noted that the patient was still tachycardic and extremely warm to the touch.  I checked his temperature and patient had a fever of 103.2 orally.  Work-up expanded.   [PC]  [id number] Patient CBC without leukocytosis. CMP notable for worsening renal insufficiency but stable from recent clinic visit.  His LFTs however are elevated from his recent clinic visit.  They believe this was possibly due to Brilinta.   Given the patient's fever, will obtain CT abdomen as well as a right upper quadrant ultrasound to assess for possible cholangitis versus cholecystitis or other serious intra-abdominal inflammatory/infectious process.  UA w/o infection. Still possibility for viral process.  Code sepsis not activated at this time given the lack of source or or resultant lactic acid.  However, empiric Rocephin ordered.    [PC]  [id number] Patient's troponin is slightly elevated but significantly lower than his recent admission for an NSTEMI.  Likely downtrending but will require second to confirm.     [PC]    Clinical Course User Index [PC] Camella Seim, Grayce Sessions, MD   Patient care turned over to Dr Ronnald Nian. Patient case and results discussed in detail; please see their note for further ED managment.      Final Clinical Impression(s) / ED Diagnoses Final diagnoses:  Epigastric  abdominal pain  Fever  Transaminitis      This chart was dictated using voice recognition software.  Despite best efforts to proofread,  errors can occur which can change the documentation meaning.   Fatima Blank, MD 06/07/20 [phone]

## 2020-06-07 NOTE — ED Triage Notes (Signed)
Pt from home with EMS; reports epigastric pain starting at 0100; EKG done enroute.

## 2020-06-07 NOTE — Consult Note (Addendum)
[facility] Consult: 8:44 AM 06/07/2020  LOS: 0 days    Referring Provider: Dr Ronnald Nian in ED  Primary Care Physician:  Dorothyann Peng, NP Primary Gastroenterologist:  Dr. Ardis Hughs     Reason for Consultation:  Pancreatitis.     HPI: [name] is a 74 y.o. male.  PMH DM2.  CAD. CABG 2016.  DES 2018, DES x3 in 10/2017..  NSTEMI 12/2016, 10/2017, 05/2020.  Diastolic dysfunction.  On Brilinta.  CKD 3.  COPD.  HIV on meds.  Pancreatitis in 2015 attributed to HIV meds (specific med not listed in allergies, pt tells me it was Truvada).  Never had GI consult re pancreatitis.  Blood transfusion 04/2014.  Fatty liver per CT in 11/2013.   Sub-centimeter adenomatous colon polyp, large hemrrhoids (later felt to be source of hematochezia) on 07/2012 colonoscopy.    11/2019 colonoscopy for surveillance polyp: mixed hemorrhoids, no bleeding, no polyps.  Likely would age out and not require future surveillance.    Patient admitted for 2 days, discharged 05/29/2020 with non-STEMI, angina.  Cardiac cath with PTCA for in-stent restenosis.  Plan to continue Brilinta, aspirin for at least 12 months.  Lipitor increased from 20 to 40 mg daily.  Hypertensive meds changed.  GI and generally healthwise wise patient feeling fine yesterday and since discharge .  Generally has reduced appetite but eats because he knows he needs to as he has a history of malnutrition.  1 AM this morning woke up with acute, intense, epigastric pain without radiation.  Briefly nauseous but no emesis.  Came to the ED, received GI cocktail at about 5:15 AM after which the pain subsided and has not returned.  No prodrome of abdominal pain. Lipase 66.  t bili 1.2 >> 6.4.  Alk phos 79.  AST/ALT 56/69 >> 133/162 WBCs 3.6.  Hgb 13.6.  Platelets 128 (once as low 149 in 2018  but o/w always normal).  INR 1.4 Abd ultrasound: Tiny GB stones and sludge, no cholecystitis.  8.9 mm CBD.   No choledocholithiasis. If clinical concern for biliary obstruction and or choledocholithiasis consider MRI/MRCP.   Retired Radio broadcast assistant.  No alcohol, no illicit drugs.  Past Medical History:  Diagnosis Date  . Absolute anemia 05/28/2014  . Aortic stenosis 09/17/2015  . Arthritis   . Ascites 11/18/2013  . BELLS PALSY 07/19/2010   Qualifier: Diagnosis of  By: [name], [name]    . CAD (coronary artery disease)    a. s/p CABG in 2003 b. s/p PCI to SVG-PDA in 07/2015 c. 11/2016: cath showing severe native CAD with patent LIMA-LAD and SVG-D1 with 80% stenosis of SVG-OM1-OM2. Initially medical management was recommended --> presented with recurrent angina --> s/p Synergy DES to proximal body of SVG-OM1-OM2, POBA to distal graft.   Marland Kitchen CAD- S/P PCI SVG-OM1 12/01/16 05/12/2006   Qualifier: Diagnosis of  By: [name], [name]    . Carotid bruit 07/10/2015  . Chest pain 11/22/2016  . Chronic kidney disease   . Chronic renal insufficiency, stage III (moderate) ([place]) 12/02/2016  . Constipation 05/28/2014  .  COPD 07/20/2008   Qualifier: Diagnosis of  By: [name], [name]   . DEPRESSION 09/03/2006   Qualifier: Diagnosis of  By: [name], [name]    . Dermatitis 11/27/2012  . Diabetes mellitus   . Diarrhea 11/20/2013  . DM (diabetes mellitus), type 2 with ophthalmic complications ([place]) 01/18/8371   Qualifier: Diagnosis of  By: [name], [name]    . Dry eye syndrome 12/20/2010  . Epicondylitis 06/05/2013   right  . Erectile dysfunction 01/01/2012  . Essential hypertension 05/12/2006   Qualifier: Diagnosis of  By: [name], [name]    . GENITAL HERPES 05/03/2009   Qualifier: Diagnosis of  By: [name], [name]    . GERD 09/03/2006   Qualifier: Diagnosis of  By: [name], [name]    . GERD (gastroesophageal reflux disease)   . HEARING LOSS, SENSORINEURAL 05/12/2006   Qualifier: Diagnosis of  By: Megan Salon  MD, [name]    . HEMATOCHEZIA 04/18/2008   Annotation: 9/09 during bout of constipation Qualifier: Diagnosis of  By: [name], [name]    . HIP PAIN, BILATERAL 07/17/2008   Qualifier: Diagnosis of  By: [name], [name]   . History of depression   . History of kidney stones   . HIV (human immunodeficiency virus infection) ([place]) 1991   on meds since initial dx.   Marland Kitchen HLD (hyperlipidemia) 11/19/2013  . Human immunodeficiency virus (HIV) disease ([place]) 05/12/2006   Qualifier: Diagnosis of  By: [name], [name]    . Hx of CABG 09/03/2006   Annotation: 2003 Qualifier: Diagnosis of  By: [name], [name]    . Hyperkalemia 03/23/2014  . Hyperlipidemia   . Hypertension   . Hyponatremia 03/23/2014  . INGUINAL LYMPHADENOPATHY, RIGHT 04/03/2009   Qualifier: Diagnosis of  By: [name], [name]    . Keratoma 01/24/2015  . KNEE PAIN, BILATERAL 07/17/2008   Qualifier: Diagnosis of  By: [name], [name]   . Lesion of breast 07/21/2015  . Lipodystrophy 12/20/2010  . Memory loss 09/18/2008   Qualifier: Diagnosis of  By: [name], [name]   . Metatarsal deformity 01/24/2015  . Nasal abscess 12/04/2015  . Night sweats 07/06/2012  . Nocturia 03/06/2013  . NSTEMI (non-ST elevated myocardial infarction) ([place]) 11/29/2016  . Pain in joint, ankle and foot 01/19/2015  . Pancreatitis 11/2013   attributed to HIV meds.   . Pedal edema 05/21/2014  . PERIPHERAL VASCULAR DISEASE 07/20/2008   Qualifier: Diagnosis of  By: [name], [name]   . Posterior cervical lymphadenopathy 03/30/2014  . Protein-calorie malnutrition, severe ([place]) 03/23/2014  . SHINGLES, HX OF 05/03/2009   Annotation: R leg Qualifier: Diagnosis of  By: [name], [name]    . STEMI (ST elevation myocardial infarction) ([place]) 07/29/2015  . Unstable angina ([place]) 11/24/2016  . WEIGHT LOSS, ABNORMAL 04/03/2009   Qualifier: Diagnosis of  By: [name], [name]      Past Surgical History:  Procedure Laterality Date  . CARDIAC CATHETERIZATION N/A 07/29/2015   Procedure:  Left Heart Cath and Coronary Angiography;  Surgeon: [name], MD;  Location: [place] [facility];  Service: Cardiovascular;  Laterality: N/A;  . CARDIAC CATHETERIZATION N/A 07/29/2015   Procedure: Coronary Stent Intervention;  Surgeon: [name], MD;  Location: [place] [facility];  Service: Cardiovascular;  Laterality: N/A;  . COLONOSCOPY  12/02/2019  . CORONARY ARTERY BYPASS GRAFT  05/2002  . CORONARY BALLOON ANGIOPLASTY N/A 05/28/2020   Procedure: CORONARY BALLOON ANGIOPLASTY;  Surgeon:  End, Harrell Gave, MD;  Location: [place] [facility];  Service: Cardiovascular;  Laterality: N/A;  . CORONARY STENT INTERVENTION N/A 12/01/2016   Procedure: Coronary Stent Intervention;  Surgeon: [name], MD;  Location: [place] [facility];  Service: Cardiovascular;  Laterality: N/A;  . CORONARY STENT INTERVENTION N/A 10/21/2017   Procedure: CORONARY STENT INTERVENTION;  Surgeon: Jettie Booze, MD;  Location: [place] [facility];  Service: Cardiovascular;  Laterality: N/A;  . LEFT HEART CATH AND CORS/GRAFTS ANGIOGRAPHY N/A 11/24/2016   Procedure: Left Heart Cath and Cors/Grafts Angiography;  Surgeon: Nelva Bush, MD;  Location: [place] [facility];  Service: Cardiovascular;  Laterality: N/A;  . LEFT HEART CATH AND CORS/GRAFTS ANGIOGRAPHY N/A 12/01/2016   Procedure: Left Heart Cath and Cors/Grafts Angiography;  Surgeon: [name], MD;  Location: [place] [facility];  Service: Cardiovascular;  Laterality: N/A;  . LEFT HEART CATH AND CORS/GRAFTS ANGIOGRAPHY N/A 10/21/2017   Procedure: LEFT HEART CATH AND CORS/GRAFTS ANGIOGRAPHY;  Surgeon: Jettie Booze, MD;  Location: [place] [facility];  Service: Cardiovascular;  Laterality: N/A;  . LEFT HEART CATH AND CORS/GRAFTS ANGIOGRAPHY N/A 05/28/2020   Procedure: LEFT HEART CATH AND CORS/GRAFTS ANGIOGRAPHY;  Surgeon: Nelva Bush, MD;  Location: [place] [facility];  Service: Cardiovascular;  Laterality: N/A;  . VASECTOMY      Prior to  Admission medications   Medication Sig Start Date End Date Taking? Authorizing Provider  acetaminophen (TYLENOL) 325 MG tablet Take 2 tablets (650 mg total) by mouth every 4 (four) hours as needed for headache or mild pain. 12/02/16   Erlene Quan, PA-C  aspirin 81 MG chewable tablet Chew 81 mg by mouth at bedtime.     [provider]  atorvastatin (LIPITOR) 40 MG tablet Take 1 tablet (40 mg total) by mouth every evening. 05/29/20   Geradine Girt, DO  b complex vitamins tablet Take 1 tablet by mouth daily. Patient taking differently: Take 1 tablet by mouth daily with breakfast.  09/25/16   Mosie Lukes, MD  Cholecalciferol (VITAMIN D3) 50 MCG (2000 UT) TABS Take 2,000 Units by mouth in the morning.    [provider]  dolutegravir (TIVICAY) 50 MG tablet TAKE 1 TABLET BY MOUTH DAILY with Pifeltro Patient taking differently: Take 50 mg by mouth See admin instructions. Take 50 mg by mouth in the morning (with Pifeltro) 05/18/20   Michel Bickers, MD  doravirine (PIFELTRO) 100 MG TABS tablet TAKE 1 TABLET(100 MG) BY MOUTH DAILY with Tivicay Patient taking differently: Take 100 mg by mouth See admin instructions. Take 100 mg by mouth in the morning (with Tivicay) 05/18/20   Michel Bickers, MD  fenofibrate 54 MG tablet TAKE 1 TABLET(54 MG) BY MOUTH DAILY Patient taking differently: Take 54 mg by mouth at bedtime.  09/28/19   Almyra Deforest, PA  furosemide (LASIX) 20 MG tablet Take 1 tablet (20 mg total) by mouth daily. 05/29/20   Geradine Girt, DO  glucose blood (FREESTYLE TEST STRIPS) test strip USE TO CHECK BLOOD SUGAR THREE TIMES DAILY 11/18/19   Elayne Snare, MD  isosorbide mononitrate (IMDUR) 30 MG 24 hr tablet Take 1 tablet (30 mg total) by mouth daily. 01/16/20   Lelon Perla, MD  Lancets (FREESTYLE) lancets Use as directed three times a day to check blood sugar.  DX E11.9 11/03/17   Elayne Snare, MD  losartan (COZAAR) 25 MG tablet Take 1 tablet (25 mg total) by mouth daily.  05/29/20  Eulogio Bear U, DO  metoprolol tartrate (LOPRESSOR) 25 MG tablet Take 0.5 tablets (12.5 mg total) by mouth 2 (two) times daily. 05/29/20   Geradine Girt, DO  nitroGLYCERIN (NITROSTAT) 0.4 MG SL tablet PLACE 1 TABLET UNDER THE TONGUE EVERY 5 MINUTES AS NEEDED FOR CHEST PAIN Patient taking differently: Place 0.4 mg under the tongue every 5 (five) minutes as needed for chest pain.  03/19/20   Lelon Perla, MD  pantoprazole (PROTONIX) 40 MG tablet TAKE 1 TABLET(40 MG) BY MOUTH DAILY Patient taking differently: Take 40 mg by mouth daily before breakfast.  10/12/19   Lelon Perla, MD  potassium chloride SA (KLOR-CON) 20 MEQ tablet TAKE 1 TABLET(20 MEQ) daily 05/29/20   Geradine Girt, DO  Probiotic Product (PROBIOTIC DAILY) CAPS Take 1 by mouth daily Patient taking differently: Take 1 capsule by mouth daily with breakfast.  09/25/16   Mosie Lukes, MD  repaglinide (PRANDIN) 2 MG tablet Take 2-4 mg by mouth See admin instructions. Take 4 mg by mouth in the morning before breakfast and 2 mg before lunch and dinner/evening meal    [provider]  sodium bicarbonate 650 MG tablet Take 650 mg by mouth 3 (three) times daily.    [provider]  tamsulosin (FLOMAX) 0.4 MG CAPS capsule Take 0.4 mg by mouth in the morning and at bedtime.  05/19/18   [provider]  ticagrelor (BRILINTA) 90 MG TABS tablet Take 1 tablet (90 mg total) by mouth 2 (two) times daily. 05/29/20   Geradine Girt, DO  valACYclovir (VALTREX) 500 MG tablet TAKE 1 TABLET BY MOUTH DAILY Patient taking differently: Take 500 mg by mouth in the morning.  03/27/20   Michel Bickers, MD  VASCEPA 1 g capsule TAKE 2 CAPSULES(2 GRAMS) BY MOUTH TWICE DAILY Patient taking differently: Take 2 g by mouth 2 (two) times daily.  03/16/20   Elayne Snare, MD    Scheduled Meds: . iohexol       Infusions: . sodium chloride 1,000 mL (06/07/20 0612)   PRN Meds:    Allergies as of 06/07/2020  . (No  Known Allergies)    Family History  Problem Relation Age of Onset  . Hyperlipidemia Mother   . Diabetes Mother        paternal grandparents/1 brother  . Hyperlipidemia Father   . Hypertension Father        paternal grandmother/3 brothers/1 sister  . Arthritis Other        mother/father/paternal grandparents  . Breast cancer Maternal Aunt        paternal aunt  . Lung cancer Maternal Aunt   . Heart disease Other        parents/maternal grandparents/ 2 brothers  . Stroke Paternal Grandmother   . Mental retardation Sister   . Colon cancer Neg Hx   . Esophageal cancer Neg Hx   . Pancreatic cancer Neg Hx   . Stomach cancer Neg Hx   . Liver disease Neg Hx   . Colon polyps Neg Hx   . Rectal cancer Neg Hx     Social History   Socioeconomic History  . Marital status: Divorced    Spouse name: Not on file  . Number of children: 4  . Years of education: Not on file  . Highest education level: Not on file  Occupational History  . 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   Tobacco Use  .  Smoking status: Never Smoker  . Smokeless tobacco: Never Used  Vaping Use  . Vaping Use: Never used  Substance and Sexual Activity  . Alcohol use: Not Currently    Alcohol/week: 0.0 standard drinks    Comment: rare  . Drug use: No  . Sexual activity: Not Currently    Comment: declined condoms  Other Topics Concern  . Not on file  Social History Narrative   Lives alone.  Supportive friends and family.  His HIV Dx is not a secret.    Social Determinants of Health   Financial Resource Strain: Low Risk   . Difficulty of Paying Living Expenses: Not hard at all  Food Insecurity: No Food Insecurity  . Worried About Charity fundraiser in the Last Year: Never true  . Ran Out of Food in the Last Year: Never true  Transportation Needs: No Transportation Needs  . Lack of Transportation (Medical): No  . Lack of Transportation (Non-Medical):  No  Physical Activity: Sufficiently Active  . Days of Exercise per Week: 7 days  . Minutes of Exercise per Session: 40 min  Stress: No Stress Concern Present  . Feeling of Stress : Not at all  Social Connections: Moderately Isolated  . Frequency of Communication with Friends and Family: More than three times a week  . Frequency of Social Gatherings with Friends and Family: Three times a week  . Attends Religious Services: More than 4 times per year  . Active Member of Clubs or Organizations: No  . Attends Archivist Meetings: Never  . Marital Status: Divorced  Human resources officer Violence: Not At Risk  . Fear of Current or Ex-Partner: No  . Emotionally Abused: No  . Physically Abused: No  . Sexually Abused: No    REVIEW OF SYSTEMS: Constitutional: No weakness, no fatigue. ENT:  No nose bleeds Pulm: No shortness of breath, no cough. CV:  No palpitations, no LE edema.  No recurrent angina GU:  No hematuria, no frequency.  No dark-colored urine GI: See HPI. Heme: No excessive or unusual bleeding or bruising. Transfusions: See HPI Neuro:  No headaches, no peripheral tingling or numbness.  No dizziness, no syncope, no seizures. Derm:  No itching, no rash or sores.  Endocrine:  No sweats or chills.  No polyuria or dysuria Immunization: Has been vaccinated and received booster with Futures trader. Travel:  None beyond local counties in last few months.    PHYSICAL EXAM: Vital signs in last 24 hours: Vitals:   06/07/20 0815 06/07/20 0828  BP: 108/68   Pulse: (!) 104   Resp: 20   Temp:  100.3 F (37.9 C)  SpO2: 96%    Wt Readings from Last 3 Encounters:  06/05/20 61.3 kg  05/27/20 62.1 kg  05/22/20 60.8 kg    General: Pleasant, comfortable, well-appearing patient looks younger than stated age. Head: No facial asymmetry or swelling.  No signs of head trauma. Eyes: No conjunctival pallor.  EOMI.  No icterus. Ears: Not hard of hearing Nose: No discharge or  congestion Mouth: Oral mucosa is pink, moist, clear.  Good dentition.  Tongue midline. Neck: No JVD, no masses, no thyromegaly Lungs: No labored breathing or cough.  Lungs clear bilaterally. Heart: RRR with soft murmur.  S1, S2 present. Abdomen: Soft, nontender, nondistended.  No HSM, masses, bruits, hernias.  Active bowel sounds..   Rectal: Deferred Musc/Skeltl: No joint redness, swelling or gross deformity. Extremities: No CCE. Neurologic: Alert.  Oriented x3.  Full  strength in all 4 limbs.  No tremors. Skin: No rash, no sores, no jaundice, no significant bruises or purpura.  No telangiectasia. Tattoos: None observed Nodes: No cervical adenopathy Psych: Pleasant, cooperative, calm, fluid speech.  Intake/Output from previous day: No intake/output data recorded. Intake/Output this shift: Total I/O In: 1000 [IV Piggyback:1000] Out: -   LAB RESULTS: Recent Labs    06/07/20 0436  WBC 3.6*  HGB 13.6  HCT 40.7  PLT 128*   BMET Lab Results  Component Value Date   NA 136 06/07/2020   NA 140 06/05/2020   NA 139 05/29/2020   K 3.6 06/07/2020   K 4.5 06/05/2020   K 3.9 05/29/2020   CL 102 06/07/2020   CL 105 06/05/2020   CL 110 05/29/2020   CO2 19 (L) 06/07/2020   CO2 25 06/05/2020   CO2 23 05/29/2020   GLUCOSE 239 (H) 06/07/2020   GLUCOSE 276 (H) 06/05/2020   GLUCOSE 106 (H) 05/29/2020   BUN 28 (H) 06/07/2020   BUN 30 (H) 06/05/2020   BUN 19 05/29/2020   CREATININE 2.08 (H) 06/07/2020   CREATININE 2.10 (H) 06/05/2020   CREATININE 1.58 (H) 05/29/2020   CALCIUM 8.5 (L) 06/07/2020   CALCIUM 9.2 06/05/2020   CALCIUM 8.8 (L) 05/29/2020   LFT Recent Labs    06/05/20 1101 06/07/20 0436  PROT 6.6 7.5  ALBUMIN  --  3.7  AST 56* 133*  ALT 69* 162*  ALKPHOS  --  79  BILITOT 1.2 6.4*   PT/INR Lab Results  Component Value Date   INR 1.4 (H) 06/07/2020   INR 1.07 10/21/2017   INR 1.04 11/24/2016   Hepatitis Panel No results for input(s): HEPBSAG, HCVAB,  HEPAIGM, HEPBIGM in the last 72 hours. C-Diff No components found for: CDIFF Lipase     Component Value Date/Time   LIPASE 66 (H) 06/07/2020 0436    Drugs of Abuse     Component Value Date/Time   LABOPIA NEGATIVE 07/19/2010 0249   COCAINSCRNUR NEGATIVE 07/19/2010 0249   LABBENZ NEGATIVE 07/19/2010 0249   AMPHETMU NEGATIVE 07/19/2010 0249     RADIOLOGY STUDIES: DG Chest 2 View  Result Date: 06/07/2020 CLINICAL DATA:  Epigastric pain. EXAM: CHEST - 2 VIEW COMPARISON:  05/27/2020. FINDINGS: Prior CABG. Stable cardiomegaly. Low lung volumes with mild bibasilar atelectasis. No pleural effusion or pneumothorax. Degenerative change thoracic spine. IMPRESSION: 1. Prior CABG. Stable cardiomegaly. 2. Low lung volumes with mild bibasilar atelectasis. Electronically Signed   By: Marcello Moores  Register   On: 06/07/2020 05:21   US Abdomen Limited RUQ (LIVER/GB)  Result Date: 06/07/2020 CLINICAL DATA:  Right upper quadrant pain EXAM: ULTRASOUND ABDOMEN LIMITED RIGHT UPPER QUADRANT COMPARISON:  MRI/MRCP 03/29/2014 FINDINGS: Gallbladder: Tiny stones and sludge noted layering within the gallbladder. No gallbladder wall thickening, inflammation, or distension. Negative sonographic Murphy's sign. Common bile duct: Diameter: 8.9 mm.  No signs of choledocholithiasis. Liver: No focal lesion identified. Within normal limits in parenchymal echogenicity. Portal vein is patent on color Doppler imaging with normal direction of blood flow towards the liver. Other: None. IMPRESSION: 1. Tiny stones and sludge noted layering within the gallbladder. No secondary signs of acute cholecystitis. 2. Mild increase caliber of the common bile duct which measures up to 8.9 mm. No signs of choledocholithiasis. If there is a clinical concern for biliary obstruction and or choledocholithiasis consider further investigation with MRI/MRCP. Right upper quadrant pain please pick the correct US ABDOMEN LIMITED template. Electronically Signed    By: Lovena Le  Clovis Riley M.D.   On: 06/07/2020 07:39     IMPRESSION:   *   Pancreatitis.  Looks to be biliary.  Previous 2015 hx med induced pancreatitis.  Repaglanide can also cause pancreatitis but CBD and is prominent and LFTs elevated so biliary cause more likely.   *   Chronic Brilinta for hx CAD and stenting post CABG, NSTEMIs, recent NSTEMI and PCI to occluded stent 05/28/20.  Last dose 11/3.    *   NIDDM  *    HIV.  Compliant with meds.  Followed by Dr. Megan Salon.    PLAN:     *   Patient is currently drinking contrast for planned CTAP.  Wonder about pursuing MRI/MRCP  *    ?  Can we hold Brilinta?  Patient may need ERCP.  Off to have laparoscopic cholecystectomy, however with recent non-STEMI and requirement for Brilinta may need to delay this.  *    Once studies completed, can have clear liquids and even advance diet as tolerated.  *    Repeat LFTs and lipase tomorrow morning  Addendum at 1350: CT shows likely multiple stones in distal CBD.  Needs ERCP but off Brilinta ideallyfor 5 days.  Wonder if would be ok to proceed after 3 d hold.  Cardiology is seeing pt and will advise.     Now on carb mod diet, no problems w lunch.     Azucena Freed  06/07/2020, 8:44 AM Phone [phone]    Attending physician's note   I have taken an interval history, reviewed the chart and examined the patient. I agree with the Advanced Practitioner's note, impression and recommendations.   Acute biliary pancreatitis. CT-multiple CBD stones. No ascending cholangitis.  Patient doing well clinically. CAD s/p CABG with multiple PCI, most recent 10/25 when he presented with NSTEMI. On Brilinta (last dose 11/3) HIV  Plan: -ERCP with biliary sphincterotomy/stone extraction off Brilinta.  Await cardiology recommendations. -Trend CBC, CMP, lipase -Low-fat diet   Carmell Austria, MD [facility]

## 2020-06-07 NOTE — H&P (Signed)
History and Physical    [name] LKT:[id number] DOB: [birth year] DOA: 06/07/2020  PCP: Dorothyann Peng, NP   Patient coming from: Home  I have personally briefly reviewed patient's old medical records in [place]  Chief Complaint: Abdominal pain  HPI: [name] is a 74 y.o. male with medical history significant for coronary artery disease status post recent left heart cath with successful PTCA to distal SVG to diagonal in-stent restenosis with 0% residual stenosis and TIMI - 3 flow, diabetes mellitus with complications of stage III chronic kidney disease, hypertension, HIV, chronic diastolic dysfunction CHF and BPH who presents to the ER for evaluation of abdominal pain mostly in the epigastrium/RUQ which started about 1 AM.  He rated his pain a 10 x 10 in intensity at its worst and states that it woke him up from sleep.  There is no radiation of the pain.  He denies having any associated nausea, no vomiting, no diarrhea or chest pain.  He complains of shortness of breath which he attributes to the pain and while in the ER had a fever with a T-max of 100.3 F.  He was also tachycardic. He denies having any headache, no dizziness, no lightheadedness, no urinary symptoms. Labs show sodium 136, potassium 3.6, chloride 109, bicarb 19, glucose 239, BUN 28, creatinine 2.08, calcium 8.5, alkaline phosphatase 79, albumin 3.7, lipase 66, AST 132, ALT 162, total protein 7.5, total bilirubin 6.4, troponin 37 >> 308, lactic acid 1.5 >> 1.0, white count 3.6 with a left shift, hemoglobin 13.6, hematocrit 40.7, MCV 97.1, RDW 13.2, platelet count 128, PT 16.2, INR 1.4  Respiratory viral panel is negative CT scan of abdomen and pelvis shows cholelithiasis.  There appears to be multiple stones in the distal common bile duct which may be resulting in mild common bile duct dilatation.  No definite intrahepatic dilatation is noted. Right upper quadrant ultrasound shows tiny stones and sludge noted layering  within the gallbladder.  No secondary signs of acute cholecystitis.  Mild increased caliber of the common bile duct which measures up to 8.9 mm.  No signs of choledocholithiasis.  If there is a clinical concern for biliary obstruction and no choledocholithiasis consider further investigation with MRCP Twelve-lead EKG shows sinus tachycardia   ED Course: Patient is a 74 year old male who presents to the emergency room for evaluation of epigastric/right upper quadrant pain which woke him up from sleep in the early hours of the morning.  Labs reveal evidence of cholestasis as well as transaminitis and imaging shows multiple stones in the distal common bile duct.  Patient has a fever with a T-max of 100.3 F, he is tachycardic and has leukopenia.  Source of sepsis appears to be intra-abdominal, possible cholangitis.  He received IV antibiotics in the emergency room as well as IV fluid hydration.  He will be admitted to the hospital for further evaluation.  GI consult has been requested.  Review of Systems: As per HPI otherwise 10 point review of systems negative.    Past Medical History:  Diagnosis Date  . Absolute anemia 05/28/2014  . Aortic stenosis 09/17/2015  . Arthritis   . Ascites 11/18/2013  . BELLS PALSY 07/19/2010   Qualifier: Diagnosis of  By: [name], [name]    . CAD (coronary artery disease)    a. s/p CABG in 2003 b. s/p PCI to SVG-PDA in 07/2015 c. 11/2016: cath showing severe native CAD with patent LIMA-LAD and SVG-D1 with 80% stenosis of SVG-OM1-OM2. Initially  medical management was recommended --> presented with recurrent angina --> s/p Synergy DES to proximal body of SVG-OM1-OM2, POBA to distal graft.   Marland Kitchen CAD- S/P PCI SVG-OM1 12/01/16 05/12/2006   Qualifier: Diagnosis of  By: [name], [name]    . Carotid bruit 07/10/2015  . Chest pain 11/22/2016  . Chronic kidney disease   . Chronic renal insufficiency, stage III (moderate) ([place]) 12/02/2016  . Constipation 05/28/2014  . COPD  07/20/2008   Qualifier: Diagnosis of  By: [name], [name]   . DEPRESSION 09/03/2006   Qualifier: Diagnosis of  By: [name], [name]    . Dermatitis 11/27/2012  . Diabetes mellitus   . Diarrhea 11/20/2013  . DM (diabetes mellitus), type 2 with ophthalmic complications ([place]) 6/50/3546   Qualifier: Diagnosis of  By: [name], [name]    . Dry eye syndrome 12/20/2010  . Epicondylitis 06/05/2013   right  . Erectile dysfunction 01/01/2012  . Essential hypertension 05/12/2006   Qualifier: Diagnosis of  By: [name], [name]    . GENITAL HERPES 05/03/2009   Qualifier: Diagnosis of  By: [name], [name]    . GERD 09/03/2006   Qualifier: Diagnosis of  By: [name], [name]    . GERD (gastroesophageal reflux disease)   . HEARING LOSS, SENSORINEURAL 05/12/2006   Qualifier: Diagnosis of  By: [name], [name]    . HEMATOCHEZIA 04/18/2008   Annotation: 9/09 during bout of constipation Qualifier: Diagnosis of  By: [name], [name]    . HIP PAIN, BILATERAL 07/17/2008   Qualifier: Diagnosis of  By: [name], [name]   . History of depression   . History of kidney stones   . HIV (human immunodeficiency virus infection) ([place]) 1991   on meds since initial dx.   Marland Kitchen HLD (hyperlipidemia) 11/19/2013  . Human immunodeficiency virus (HIV) disease ([place]) 05/12/2006   Qualifier: Diagnosis of  By: [name], [name]    . Hx of CABG 09/03/2006   Annotation: 2003 Qualifier: Diagnosis of  By: [name], [name]    . Hyperkalemia 03/23/2014  . Hyperlipidemia   . Hypertension   . Hyponatremia 03/23/2014  . INGUINAL LYMPHADENOPATHY, RIGHT 04/03/2009   Qualifier: Diagnosis of  By: [name], [name]    . Keratoma 01/24/2015  . KNEE PAIN, BILATERAL 07/17/2008   Qualifier: Diagnosis of  By: [name], [name]   . Lesion of breast 07/21/2015  . Lipodystrophy 12/20/2010  . Memory loss 09/18/2008   Qualifier: Diagnosis of  By: [name], [name]   . Metatarsal deformity 01/24/2015  . Nasal abscess 12/04/2015  . Night sweats 07/06/2012    . Nocturia 03/06/2013  . NSTEMI (non-ST elevated myocardial infarction) ([place]) 11/29/2016  . Pain in joint, ankle and foot 01/19/2015  . Pancreatitis 11/2013   attributed to HIV meds.   . Pedal edema 05/21/2014  . PERIPHERAL VASCULAR DISEASE 07/20/2008   Qualifier: Diagnosis of  By: [name], [name]   . Posterior cervical lymphadenopathy 03/30/2014  . Protein-calorie malnutrition, severe ([place]) 03/23/2014  . SHINGLES, HX OF 05/03/2009   Annotation: R leg Qualifier: Diagnosis of  By: [name], [name]    . STEMI (ST elevation myocardial infarction) ([place]) 07/29/2015  . Unstable angina ([place]) 11/24/2016  . WEIGHT LOSS, ABNORMAL 04/03/2009   Qualifier: Diagnosis of  By: [name], [name]      Past Surgical History:  Procedure Laterality Date  . CARDIAC CATHETERIZATION N/A 07/29/2015   Procedure: Left Heart Cath and Coronary Angiography;  Surgeon: [name], MD;  Location: [facility];  Service: Cardiovascular;  Laterality: N/A;  . CARDIAC CATHETERIZATION N/A 07/29/2015   Procedure: Coronary Stent Intervention;  Surgeon: [name], MD;  Location: [facility];  Service: Cardiovascular;  Laterality: N/A;  . COLONOSCOPY  12/02/2019  . CORONARY ARTERY BYPASS GRAFT  05/2002  . CORONARY BALLOON ANGIOPLASTY N/A 05/28/2020   Procedure: CORONARY BALLOON ANGIOPLASTY;  Surgeon: Nelva Bush, MD;  Location: [facility];  Service: Cardiovascular;  Laterality: N/A;  . CORONARY STENT INTERVENTION N/A 12/01/2016   Procedure: Coronary Stent Intervention;  Surgeon: [name], MD;  Location: [facility];  Service: Cardiovascular;  Laterality: N/A;  . CORONARY STENT INTERVENTION N/A 10/21/2017   Procedure: CORONARY STENT INTERVENTION;  Surgeon: Jettie Booze, MD;  Location: [facility];  Service: Cardiovascular;  Laterality: N/A;  . LEFT HEART CATH AND CORS/GRAFTS ANGIOGRAPHY N/A 11/24/2016   Procedure: Left Heart Cath and Cors/Grafts Angiography;  Surgeon:  Nelva Bush, MD;  Location: [facility];  Service: Cardiovascular;  Laterality: N/A;  . LEFT HEART CATH AND CORS/GRAFTS ANGIOGRAPHY N/A 12/01/2016   Procedure: Left Heart Cath and Cors/Grafts Angiography;  Surgeon: [name], MD;  Location: [facility];  Service: Cardiovascular;  Laterality: N/A;  . LEFT HEART CATH AND CORS/GRAFTS ANGIOGRAPHY N/A 10/21/2017   Procedure: LEFT HEART CATH AND CORS/GRAFTS ANGIOGRAPHY;  Surgeon: Jettie Booze, MD;  Location: [facility];  Service: Cardiovascular;  Laterality: N/A;  . LEFT HEART CATH AND CORS/GRAFTS ANGIOGRAPHY N/A 05/28/2020   Procedure: LEFT HEART CATH AND CORS/GRAFTS ANGIOGRAPHY;  Surgeon: Nelva Bush, MD;  Location: [facility];  Service: Cardiovascular;  Laterality: N/A;  . VASECTOMY       reports that he has never smoked. He has never used smokeless tobacco. He reports previous alcohol use. He reports that he does not use drugs.  No Known Allergies  Family History  Problem Relation Age of Onset  . Hyperlipidemia Mother   . Diabetes Mother        paternal grandparents/1 brother  . Hyperlipidemia Father   . Hypertension Father        paternal grandmother/3 brothers/1 sister  . Arthritis Other        mother/father/paternal grandparents  . Breast cancer Maternal Aunt        paternal aunt  . Lung cancer Maternal Aunt   . Heart disease Other        parents/maternal grandparents/ 2 brothers  . Stroke Paternal Grandmother   . Mental retardation Sister   . Colon cancer Neg Hx   . Esophageal cancer Neg Hx   . Pancreatic cancer Neg Hx   . Stomach cancer Neg Hx   . Liver disease Neg Hx   . Colon polyps Neg Hx   . Rectal cancer Neg Hx      Prior to Admission medications   Medication Sig Start Date End Date Taking? Authorizing Provider  acetaminophen (TYLENOL) 325 MG tablet Take 2 tablets (650 mg total) by mouth every 4 (four) hours as needed for headache or mild pain. 12/02/16   Erlene Quan, PA-C  aspirin 81 MG chewable tablet Chew 81 mg by mouth at bedtime.     [provider]  atorvastatin (LIPITOR) 40 MG tablet Take 1 tablet (40 mg total) by mouth every evening. 05/29/20   Geradine Girt, DO  b complex vitamins tablet Take 1 tablet  by mouth daily. Patient taking differently: Take 1 tablet by mouth daily with breakfast.  09/25/16   Mosie Lukes, MD  Cholecalciferol (VITAMIN D3) 50 MCG (2000 UT) TABS Take 2,000 Units by mouth in the morning.    [provider]  dolutegravir (TIVICAY) 50 MG tablet TAKE 1 TABLET BY MOUTH DAILY with Pifeltro Patient taking differently: Take 50 mg by mouth See admin instructions. Take 50 mg by mouth in the morning (with Pifeltro) 05/18/20   Michel Bickers, MD  doravirine (PIFELTRO) 100 MG TABS tablet TAKE 1 TABLET(100 MG) BY MOUTH DAILY with Tivicay Patient taking differently: Take 100 mg by mouth See admin instructions. Take 100 mg by mouth in the morning (with Tivicay) 05/18/20   Michel Bickers, MD  fenofibrate 54 MG tablet TAKE 1 TABLET(54 MG) BY MOUTH DAILY Patient taking differently: Take 54 mg by mouth at bedtime.  09/28/19   Almyra Deforest, PA  furosemide (LASIX) 20 MG tablet Take 1 tablet (20 mg total) by mouth daily. 05/29/20   Geradine Girt, DO  glucose blood (FREESTYLE TEST STRIPS) test strip USE TO CHECK BLOOD SUGAR THREE TIMES DAILY 11/18/19   Elayne Snare, MD  isosorbide mononitrate (IMDUR) 30 MG 24 hr tablet Take 1 tablet (30 mg total) by mouth daily. 01/16/20   Lelon Perla, MD  Lancets (FREESTYLE) lancets Use as directed three times a day to check blood sugar.  DX E11.9 11/03/17   Elayne Snare, MD  losartan (COZAAR) 25 MG tablet Take 1 tablet (25 mg total) by mouth daily. 05/29/20   Geradine Girt, DO  metoprolol tartrate (LOPRESSOR) 25 MG tablet Take 0.5 tablets (12.5 mg total) by mouth 2 (two) times daily. 05/29/20   Geradine Girt, DO  nitroGLYCERIN (NITROSTAT) 0.4 MG SL tablet PLACE 1 TABLET UNDER THE TONGUE  EVERY 5 MINUTES AS NEEDED FOR CHEST PAIN Patient taking differently: Place 0.4 mg under the tongue every 5 (five) minutes as needed for chest pain.  03/19/20   Lelon Perla, MD  pantoprazole (PROTONIX) 40 MG tablet TAKE 1 TABLET(40 MG) BY MOUTH DAILY Patient taking differently: Take 40 mg by mouth daily before breakfast.  10/12/19   Lelon Perla, MD  potassium chloride SA (KLOR-CON) 20 MEQ tablet TAKE 1 TABLET(20 MEQ) daily 05/29/20   Geradine Girt, DO  Probiotic Product (PROBIOTIC DAILY) CAPS Take 1 by mouth daily Patient taking differently: Take 1 capsule by mouth daily with breakfast.  09/25/16   Mosie Lukes, MD  repaglinide (PRANDIN) 2 MG tablet Take 2-4 mg by mouth See admin instructions. Take 4 mg by mouth in the morning before breakfast and 2 mg before lunch and dinner/evening meal    [provider]  sodium bicarbonate 650 MG tablet Take 650 mg by mouth 3 (three) times daily.    [provider]  tamsulosin (FLOMAX) 0.4 MG CAPS capsule Take 0.4 mg by mouth in the morning and at bedtime.  05/19/18   [provider]  ticagrelor (BRILINTA) 90 MG TABS tablet Take 1 tablet (90 mg total) by mouth 2 (two) times daily. 05/29/20   Geradine Girt, DO  valACYclovir (VALTREX) 500 MG tablet TAKE 1 TABLET BY MOUTH DAILY Patient taking differently: Take 500 mg by mouth in the morning.  03/27/20   Michel Bickers, MD  VASCEPA 1 g capsule TAKE 2 CAPSULES(2 GRAMS) BY MOUTH TWICE DAILY Patient taking differently: Take 2 g by mouth 2 (two) times daily.  03/16/20   Elayne Snare,  MD    Physical Exam: Vitals:   06/07/20 0753 06/07/20 0800 06/07/20 0815 06/07/20 0828  BP: 131/72 123/70 108/68   Pulse: (!) 106 (!) 109 (!) 104   Resp: 16  20   Temp:    100.3 F (37.9 C)  TempSrc:    Oral  SpO2: 97% 96% 96%      Vitals:   06/07/20 0753 06/07/20 0800 06/07/20 0815 06/07/20 0828  BP: 131/72 123/70 108/68   Pulse: (!) 106 (!) 109 (!) 104   Resp: 16  20   Temp:     100.3 F (37.9 C)  TempSrc:    Oral  SpO2: 97% 96% 96%     Constitutional: NAD, alert and oriented x 3.  Chronically ill-appearing Eyes: PERRL, lids and conjunctivae pallor ENMT: Mucous membranes are moist.  Neck: normal, supple, no masses, no thyromegaly Respiratory: clear to auscultation bilaterally, no wheezing, no crackles. Normal respiratory effort. No accessory muscle use.  Cardiovascular: Regular rate and rhythm, no murmurs / rubs / gallops. No extremity edema. 2+ pedal pulses. No carotid bruits.  Abdomen: Epigastrium/RUQ tenderness, no masses palpated. No hepatosplenomegaly. Bowel sounds positive.  Musculoskeletal: no clubbing / cyanosis. No joint deformity upper and lower extremities.  Skin: no rashes, lesions, ulcers.  Neurologic: No gross focal neurologic deficit. Psychiatric: Normal mood and affect.   Labs on Admission: I have personally reviewed following labs and imaging studies  CBC: Recent Labs  Lab 06/07/20 0436  WBC 3.6*  NEUTROABS 3.4  HGB 13.6  HCT 40.7  MCV 97.1  PLT 742*   Basic Metabolic Panel: Recent Labs  Lab 06/05/20 1101 06/07/20 0436  NA 140 136  K 4.5 3.6  CL 105 102  CO2 25 19*  GLUCOSE 276* 239*  BUN 30* 28*  CREATININE 2.10* 2.08*  CALCIUM 9.2 8.5*   GFR: Estimated Creatinine Clearance: 25.8 mL/min (A) (by C-G formula based on SCr of 2.08 mg/dL (H)). Liver Function Tests: Recent Labs  Lab 06/05/20 1101 06/07/20 0436  AST 56* 133*  ALT 69* 162*  ALKPHOS  --  79  BILITOT 1.2 6.4*  PROT 6.6 7.5  ALBUMIN  --  3.7   Recent Labs  Lab 06/07/20 0436  LIPASE 66*   No results for input(s): AMMONIA in the last 168 hours. Coagulation Profile: Recent Labs  Lab 06/07/20 0552  INR 1.4*   Cardiac Enzymes: No results for input(s): CKTOTAL, CKMB, CKMBINDEX, TROPONINI in the last 168 hours. BNP (last 3 results) No results for input(s): PROBNP in the last 8760 hours. HbA1C: No results for input(s): HGBA1C in the last 72  hours. CBG: No results for input(s): GLUCAP in the last 168 hours. Lipid Profile: No results for input(s): CHOL, HDL, LDLCALC, TRIG, CHOLHDL, LDLDIRECT in the last 72 hours. Thyroid Function Tests: No results for input(s): TSH, T4TOTAL, FREET4, T3FREE, THYROIDAB in the last 72 hours. Anemia Panel: No results for input(s): VITAMINB12, FOLATE, FERRITIN, TIBC, IRON, RETICCTPCT in the last 72 hours. Urine analysis:    Component Value Date/Time   COLORURINE AMBER (A) 06/07/2020 0609   APPEARANCEUR CLEAR 06/07/2020 0609   LABSPEC 1.014 06/07/2020 0609   PHURINE 5.0 06/07/2020 0609   GLUCOSEU >=500 (A) 06/07/2020 0609   GLUCOSEU NEGATIVE 04/26/2019 0854   HGBUR SMALL (A) 06/07/2020 0609   BILIRUBINUR NEGATIVE 06/07/2020 0609   BILIRUBINUR neg 12/08/2019 1015   KETONESUR 5 (A) 06/07/2020 0609   PROTEINUR 100 (A) 06/07/2020 0609   UROBILINOGEN 0.2 12/08/2019 1015   UROBILINOGEN 0.2  04/26/2019 0854   NITRITE NEGATIVE 06/07/2020 0609   LEUKOCYTESUR NEGATIVE 06/07/2020 8338    Radiological Exams on Admission: CT ABDOMEN PELVIS WO CONTRAST  Result Date: 06/07/2020 CLINICAL DATA:  Left lower quadrant abdominal pain. EXAM: CT ABDOMEN AND PELVIS WITHOUT CONTRAST TECHNIQUE: Multidetector CT imaging of the abdomen and pelvis was performed following the standard protocol without IV contrast. COMPARISON:  March 22, 2014. FINDINGS: Lower chest: No acute abnormality. Hepatobiliary: Cholelithiasis is noted. There appears to be multiple stones in the distal common bile duct which may be resulting in mild common bile duct dilatation. No definite intrahepatic dilatation is noted. Pancreas: Unremarkable. No pancreatic ductal dilatation or surrounding inflammatory changes. Spleen: Normal in size without focal abnormality. Adrenals/Urinary Tract: Adrenal glands are unremarkable. Kidneys are normal, without renal calculi, focal lesion, or hydronephrosis. Bladder is unremarkable. Stomach/Bowel: Stomach is within  normal limits. Appendix appears normal. No evidence of bowel wall thickening, distention, or inflammatory changes. Vascular/Lymphatic: No significant vascular findings are present. No enlarged abdominal or pelvic lymph nodes. Reproductive: Mild prostatic enlargement is noted. Other: No abdominal wall hernia or abnormality. No abdominopelvic ascites. Musculoskeletal: No acute or significant osseous findings. IMPRESSION: 1. Cholelithiasis is noted. There appears to be multiple stones in the distal common bile duct which may be resulting in mild common bile duct dilatation. No definite intrahepatic dilatation is noted. Correlation with liver function tests is recommended to evaluate for distal common bile duct obstruction. 2. Mild prostatic enlargement. Electronically Signed   By: Marijo Conception M.D.   On: 06/07/2020 10:03   DG Chest 2 View  Result Date: 06/07/2020 CLINICAL DATA:  Epigastric pain. EXAM: CHEST - 2 VIEW COMPARISON:  05/27/2020. FINDINGS: Prior CABG. Stable cardiomegaly. Low lung volumes with mild bibasilar atelectasis. No pleural effusion or pneumothorax. Degenerative change thoracic spine. IMPRESSION: 1. Prior CABG. Stable cardiomegaly. 2. Low lung volumes with mild bibasilar atelectasis. Electronically Signed   By: Marcello Moores  Register   On: 06/07/2020 05:21   US Abdomen Limited RUQ (LIVER/GB)  Result Date: 06/07/2020 CLINICAL DATA:  Right upper quadrant pain EXAM: ULTRASOUND ABDOMEN LIMITED RIGHT UPPER QUADRANT COMPARISON:  MRI/MRCP 03/29/2014 FINDINGS: Gallbladder: Tiny stones and sludge noted layering within the gallbladder. No gallbladder wall thickening, inflammation, or distension. Negative sonographic Murphy's sign. Common bile duct: Diameter: 8.9 mm.  No signs of choledocholithiasis. Liver: No focal lesion identified. Within normal limits in parenchymal echogenicity. Portal vein is patent on color Doppler imaging with normal direction of blood flow towards the liver. Other: None.  IMPRESSION: 1. Tiny stones and sludge noted layering within the gallbladder. No secondary signs of acute cholecystitis. 2. Mild increase caliber of the common bile duct which measures up to 8.9 mm. No signs of choledocholithiasis. If there is a clinical concern for biliary obstruction and or choledocholithiasis consider further investigation with MRI/MRCP. Right upper quadrant pain please pick the correct US ABDOMEN LIMITED template. Electronically Signed   By: Kerby Moors M.D.   On: 06/07/2020 07:39    EKG: Independently reviewed.  Sinus tachycardia   Principal Problem:   Sepsis ([place]) Active Problems:   Human immunodeficiency virus (HIV) disease ([facility])   Depression, recurrent ([facility])   Chronic obstructive pulmonary disease ([facility])   HIV disease ([place])   Chronic kidney disease, stage 3b ([place])   Coronary artery disease   Type 2 diabetes mellitus with stage 3b chronic kidney disease, without long-term current use of insulin ([place])   Benign prostatic hyperplasia without lower urinary tract symptoms   Chronic diastolic CHF (  congestive heart failure) (H[facility])   Calculus of bile duct with acute cholangitis      Sepsis from presumed acute cholangitis Patient presents to the ER for evaluation of pain in the epigastrium/right upper quadrant He has evidence of cholestasis which includes transaminitis, increased total bilirubin of 6.4 He has a low-grade fever and was tachycardic upon presentation, he also has leukopenia with a left shift We will start patient empirically on antibiotic therapy with Rocephin and Flagyl for possible acute cholangitis Follow-up results of blood cultures IV fluid resuscitation    Diabetes mellitus with complications of stage III chronic kidney disease Maintain clear liquid diet Sliding scale insulin for glycemic control    Coronary artery disease status post CABG Patient has a history of severe coronary artery disease and is status post recent cath with successful  PTCA to distal SVG to diagonal in-stent restenosis with 0% residual stenosis and TIMI - 3 flow Patient is on aspirin and Brilinta which should be placed on hold for planned procedure if ok with cardiology Patient noted to have a bump in his troponin 37 >> 308 and this may be secondary to demand ischemia from sepsis Continue nitrates and beta-blocker Statins on hold due to transaminitis   History of chronic diastolic dysfunction CHF Continue metoprolol and nitrate Hold furosemide for now   HIV Continue HAART     DVT prophylaxis: SCD Code Status: Full code Family Communication: Greater than 50% of time was spent discussing plan of care with patient at the bedside.  All questions and concerns have been addressed.  He verbalizes understanding and agrees with the plan.  He wants his daughter to make medical decisions for him if he is unable to. Disposition Plan: Back to previous home environment Consults called: Gastroenterology/cardiology    Collier Bullock MD [facility]     06/07/2020, 10:51 AM

## 2020-06-07 NOTE — Progress Notes (Signed)
PHARMACY - PHYSICIAN COMMUNICATION CRITICAL VALUE ALERT - BLOOD CULTURE IDENTIFICATION (BCID)  [name] is an 74 y.o. male who presented to [facility] on 06/07/2020 with a chief complaint of abdominal pain  Assessment:   Blood culture growing E. Coli  Name of physician (or Provider) Contacted:  Dr. Cyd Silence  Current antibiotics:  Rocephin and Flagyl  Changes to prescribed antibiotics recommended:  D/C Flagyl, continue Rocephin 2 g IV q24h  Results for orders placed or performed during the hospital encounter of 06/07/20  Blood Culture ID Panel (Reflexed) (Collected: 06/07/2020  5:52 AM)  Result Value Ref Range   Enterococcus faecalis NOT DETECTED NOT DETECTED   Enterococcus Faecium NOT DETECTED NOT DETECTED   Listeria monocytogenes NOT DETECTED NOT DETECTED   Staphylococcus species NOT DETECTED NOT DETECTED   Staphylococcus aureus (BCID) NOT DETECTED NOT DETECTED   Staphylococcus epidermidis NOT DETECTED NOT DETECTED   Staphylococcus lugdunensis NOT DETECTED NOT DETECTED   Streptococcus species NOT DETECTED NOT DETECTED   Streptococcus agalactiae NOT DETECTED NOT DETECTED   Streptococcus pneumoniae NOT DETECTED NOT DETECTED   Streptococcus pyogenes NOT DETECTED NOT DETECTED   A.calcoaceticus-baumannii NOT DETECTED NOT DETECTED   Bacteroides fragilis NOT DETECTED NOT DETECTED   Enterobacterales DETECTED (A) NOT DETECTED   Enterobacter cloacae complex NOT DETECTED NOT DETECTED   Escherichia coli DETECTED (A) NOT DETECTED   Klebsiella aerogenes NOT DETECTED NOT DETECTED   Klebsiella oxytoca NOT DETECTED NOT DETECTED   Klebsiella pneumoniae NOT DETECTED NOT DETECTED   Proteus species NOT DETECTED NOT DETECTED   Salmonella species NOT DETECTED NOT DETECTED   Serratia marcescens NOT DETECTED NOT DETECTED   Haemophilus influenzae NOT DETECTED NOT DETECTED   Neisseria meningitidis NOT DETECTED NOT DETECTED   Pseudomonas aeruginosa NOT DETECTED NOT DETECTED   Stenotrophomonas  maltophilia NOT DETECTED NOT DETECTED   Candida albicans NOT DETECTED NOT DETECTED   Candida auris NOT DETECTED NOT DETECTED   Candida glabrata NOT DETECTED NOT DETECTED   Candida krusei NOT DETECTED NOT DETECTED   Candida parapsilosis NOT DETECTED NOT DETECTED   Candida tropicalis NOT DETECTED NOT DETECTED   Cryptococcus neoformans/gattii NOT DETECTED NOT DETECTED   CTX-M ESBL NOT DETECTED NOT DETECTED   Carbapenem resistance IMP NOT DETECTED NOT DETECTED   Carbapenem resistance KPC NOT DETECTED NOT DETECTED   Carbapenem resistance NDM NOT DETECTED NOT DETECTED   Carbapenem resist OXA 48 LIKE NOT DETECTED NOT DETECTED   Carbapenem resistance VIM NOT DETECTED NOT DETECTED    [name] 06/07/2020  11:16 PM

## 2020-06-07 NOTE — ED Provider Notes (Signed)
Assumed care of patient at 7 AM. Patient is a 74 year old male with history of HIV, CAD who presents the ED with abdominal pain. Work-up has revealed patient with fever and tachycardia shortly after presenting with mostly upper abdominal pain. Sepsis work-up has been initiated and there is a concern for choledocholithiasis/cholangitis as liver enzymes are elevated including bilirubin at 6.4. Lipase however only mildly elevated. Patient with normal lactic acid and a white count of 3.6. Possibly a viral process and hepatitis panel and Covid testing and influenza testing has been initiated as well. Patient has been given IV antibiotics as well as fluid bolus. Awaiting results from right upper quadrant ultrasound and CT scan abdomen pelvis. Anticipate admission for further infectious care. Patient follows with [facility] for pancreatitis in the past.  Ultrasound shows some gallstones and sludge within the gallbladder but no secondary signs of acute cholecystitis.  There is increased size of the common bile duct but no signs of choledocholithiasis.  Will touch base with gastroenterology about further recommendations.  Dr. Carlean Purl with GI does recommend starting with a CT scan abdomen pelvis and then admitting the patient to the hospital.  They will evaluate for further recommendations and recommend continuing antibiotics for possible infectious process.  Troponin uptrending to 308.  Talked with cardiology and likely in the setting of recent PCI and now infectious process.  Recommend continuing to trend troponin.  Likely not cardiac.  CT scan shows likely some stones within the common bile duct.  Suspect likely cholangitis.  Patient appears septic from this.  To be admitted to medicine.  GI following and anticipate likely an MRCP.  This chart was dictated using voice recognition software.  Despite best efforts to proofread,  errors can occur which can change the documentation meaning.   .Critical Care Performed  by: Lennice Sites, DO Authorized by: Lennice Sites, DO   Critical care provider statement:    Critical care time (minutes):  35   Critical care was necessary to treat or prevent imminent or life-threatening deterioration of the following conditions:  Sepsis   Critical care was time spent personally by me on the following activities:  Blood draw for specimens, development of treatment plan with patient or surrogate, discussions with consultants, discussions with primary provider, evaluation of patient's response to treatment, examination of patient, obtaining history from patient or surrogate, ordering and performing treatments and interventions, ordering and review of laboratory studies, ordering and review of radiographic studies, pulse oximetry, re-evaluation of patient's condition and review of old charts   I assumed direction of critical care for this patient from another provider in my specialty: yes        Lennice Sites, DO 06/07/20 1022

## 2020-06-07 NOTE — Consult Note (Addendum)
Cardiology Consultation:   Patient ID: [name] MRN: [id number]; DOB: 26-Dec-1945  Admit date: 06/07/2020 Date of Consult: 06/07/2020  Primary Care Provider: Dorothyann Peng, NP [facility] Cardiologist: Kirk Ruths, MD  [facility] Electrophysiologist:  None    Patient Profile:   [name] is a 74 y.o. male with a hx of HIV, CAD status post CABG with multiple PCI, most recent PTCA to distal SVG-for in-stent restenosis (10/25), diabetes, hyperlipidemia, COPD, mild aortic stenosis who is being seen today for the evaluation of preoperative evaluation at the request of Dr. Francine Graven.  History of Present Illness:   Mr. [name] is a 74 year old male with past medical history noted above.  He is followed by Dr. Stanford Breed as an outpatient.  Past medical history includes CABG in 2013 with PCI of graft to ramus and OM branches in 2018, PCI of SVG to diagonal 2019 along with PCI to proximal graft, and SVG to OM jump graft.  Noted to have left to left collaterals that fill distal left circumflex.  He had been doing well up until recent admission on 10/24 when he presented with chest pain.  In the ED was noted to have a non-STEMI and was taken for cardiac catheterization on 10/25 with Dr. Saunders Revel which noted patent LIMA to diagonal/LAD, SVG to diagonal with 90% in-stent restenosis involving distal anastomosis, patent SVG to ramus intermedius with CTO of jump portion of OM1, chronic total occlusion of sequential vein graft to RPDA and R PL.  He underwent successful PTCA to distal SVG to diagonal with TIMI-3 flow.  Plan was to continue on dual antiplatelet therapy with aspirin/ticagrelor for at least 12 months.  Echo during that admission showed an EF of 40 to 53%, grade 1 diastolic dysfunction, inferior, posterior and mid anterior lateral wall hypokinesis.  Home medications included aspirin 81 mg daily, Brilinta 90 mg twice daily, atorvastatin 40 mg daily, fenofibrate 54 mg daily, Vascepa 2 g twice daily,  Lopressor 12.5 twice daily, losartan 25 mg daily.  Outpatient follow-up was arranged.  Presented to the ED on 11/4 with complaints of abdominal pain mostly in the epigastric and right upper quadrant region which started about 1 AM that morning.  Denies any associated nausea, vomiting or diarrhea.  Also complained of shortness of breath.  In the ER his labs showed stable electrolytes, creatinine 2, AST 133, ALT 162, Lipase 66, BNP 162, high-sensitivity troponin 37>>308, WBC 3.6, hemoglobin 13.6.  He showed sinus rhythm with LVH, prior inferior infarct, prolonged QTC.  CT abdomen/pelvis showed cholelithiasis with multiple stones in the common bile duct.  Right upper quadrant ultrasound showed tiny stones and sludge within the gallbladder.  Evaluated by GI with further work-up pending possible ERCP. cardiology has been consulted in regards to preoperative management relating to dual antiplatelet therapy with recent NSTEMI.   Past Medical History:  Diagnosis Date  . Absolute anemia 05/28/2014  . Aortic stenosis 09/17/2015  . Arthritis   . Ascites 11/18/2013  . BELLS PALSY 07/19/2010   Qualifier: Diagnosis of  By: [name], [name]    . CAD (coronary artery disease)    a. s/p CABG in 2003 b. s/p PCI to SVG-PDA in 07/2015 c. 11/2016: cath showing severe native CAD with patent LIMA-LAD and SVG-D1 with 80% stenosis of SVG-OM1-OM2. Initially medical management was recommended --> presented with recurrent angina --> s/p Synergy DES to proximal body of SVG-OM1-OM2, POBA to distal graft.   Marland Kitchen CAD- S/P PCI SVG-OM1 12/01/16 05/12/2006   Qualifier:  Diagnosis of  By: [name], [name]    . Carotid bruit 07/10/2015  . Chest pain 11/22/2016  . Chronic kidney disease   . Chronic renal insufficiency, stage III (moderate) ([place]) 12/02/2016  . Constipation 05/28/2014  . COPD 07/20/2008   Qualifier: Diagnosis of  By: [name], [name]   . DEPRESSION 09/03/2006   Qualifier: Diagnosis of  By: [name], [name]    . Dermatitis  11/27/2012  . Diabetes mellitus   . Diarrhea 11/20/2013  . DM (diabetes mellitus), type 2 with ophthalmic complications ([place]) 6/38/4665   Qualifier: Diagnosis of  By: [name], [name]    . Dry eye syndrome 12/20/2010  . Epicondylitis 06/05/2013   right  . Erectile dysfunction 01/01/2012  . Essential hypertension 05/12/2006   Qualifier: Diagnosis of  By: [name], [name]    . GENITAL HERPES 05/03/2009   Qualifier: Diagnosis of  By: [name], [name]    . GERD 09/03/2006   Qualifier: Diagnosis of  By: [name], [name]    . GERD (gastroesophageal reflux disease)   . HEARING LOSS, SENSORINEURAL 05/12/2006   Qualifier: Diagnosis of  By: [name], [name]    . HEMATOCHEZIA 04/18/2008   Annotation: 9/09 during bout of constipation Qualifier: Diagnosis of  By: [name], [name]    . HIP PAIN, BILATERAL 07/17/2008   Qualifier: Diagnosis of  By: [name], [name]   . History of depression   . History of kidney stones   . HIV (human immunodeficiency virus infection) ([place]) 1991   on meds since initial dx.   Marland Kitchen HLD (hyperlipidemia) 11/19/2013  . Human immunodeficiency virus (HIV) disease ([place]) 05/12/2006   Qualifier: Diagnosis of  By: [name], [name]    . Hx of CABG 09/03/2006   Annotation: 2003 Qualifier: Diagnosis of  By: [name], [name]    . Hyperkalemia 03/23/2014  . Hyperlipidemia   . Hypertension   . Hyponatremia 03/23/2014  . INGUINAL LYMPHADENOPATHY, RIGHT 04/03/2009   Qualifier: Diagnosis of  By: [name], [name]    . Keratoma 01/24/2015  . KNEE PAIN, BILATERAL 07/17/2008   Qualifier: Diagnosis of  By: [name], [name]   . Lesion of breast 07/21/2015  . Lipodystrophy 12/20/2010  . Memory loss 09/18/2008   Qualifier: Diagnosis of  By: [name], [name]   . Metatarsal deformity 01/24/2015  . Nasal abscess 12/04/2015  . Night sweats 07/06/2012  . Nocturia 03/06/2013  . NSTEMI (non-ST elevated myocardial infarction) ([place]) 11/29/2016  . Pain in joint, ankle and foot 01/19/2015  . Pancreatitis  11/2013   attributed to HIV meds.   . Pedal edema 05/21/2014  . PERIPHERAL VASCULAR DISEASE 07/20/2008   Qualifier: Diagnosis of  By: [name], [name]   . Posterior cervical lymphadenopathy 03/30/2014  . Protein-calorie malnutrition, severe ([place]) 03/23/2014  . SHINGLES, HX OF 05/03/2009   Annotation: R leg Qualifier: Diagnosis of  By: [name], [name]    . STEMI (ST elevation myocardial infarction) ([place]) 07/29/2015  . Unstable angina ([place]) 11/24/2016  . WEIGHT LOSS, ABNORMAL 04/03/2009   Qualifier: Diagnosis of  By: [name], [name]      Past Surgical History:  Procedure Laterality Date  . CARDIAC CATHETERIZATION N/A 07/29/2015   Procedure: Left Heart Cath and Coronary Angiography;  Surgeon: [name], MD;  Location: [place] [facility];  Service: Cardiovascular;  Laterality: N/A;  . CARDIAC CATHETERIZATION N/A 07/29/2015   Procedure: Coronary Stent Intervention;  Surgeon: [name],  MD;  Location: [place] [facility];  Service: Cardiovascular;  Laterality: N/A;  . COLONOSCOPY  12/02/2019  . CORONARY ARTERY BYPASS GRAFT  05/2002  . CORONARY BALLOON ANGIOPLASTY N/A 05/28/2020   Procedure: CORONARY BALLOON ANGIOPLASTY;  Surgeon: Nelva Bush, MD;  Location: [place] [facility];  Service: Cardiovascular;  Laterality: N/A;  . CORONARY STENT INTERVENTION N/A 12/01/2016   Procedure: Coronary Stent Intervention;  Surgeon: [name], MD;  Location: [place] [facility];  Service: Cardiovascular;  Laterality: N/A;  . CORONARY STENT INTERVENTION N/A 10/21/2017   Procedure: CORONARY STENT INTERVENTION;  Surgeon: Jettie Booze, MD;  Location: [place] [facility];  Service: Cardiovascular;  Laterality: N/A;  . LEFT HEART CATH AND CORS/GRAFTS ANGIOGRAPHY N/A 11/24/2016   Procedure: Left Heart Cath and Cors/Grafts Angiography;  Surgeon: Nelva Bush, MD;  Location: [place] [facility];  Service: Cardiovascular;  Laterality: N/A;  . LEFT HEART CATH AND CORS/GRAFTS ANGIOGRAPHY N/A  12/01/2016   Procedure: Left Heart Cath and Cors/Grafts Angiography;  Surgeon: [name], MD;  Location: [place] [facility];  Service: Cardiovascular;  Laterality: N/A;  . LEFT HEART CATH AND CORS/GRAFTS ANGIOGRAPHY N/A 10/21/2017   Procedure: LEFT HEART CATH AND CORS/GRAFTS ANGIOGRAPHY;  Surgeon: Jettie Booze, MD;  Location: [place] [facility];  Service: Cardiovascular;  Laterality: N/A;  . LEFT HEART CATH AND CORS/GRAFTS ANGIOGRAPHY N/A 05/28/2020   Procedure: LEFT HEART CATH AND CORS/GRAFTS ANGIOGRAPHY;  Surgeon: Nelva Bush, MD;  Location: [place] [facility];  Service: Cardiovascular;  Laterality: N/A;  . VASECTOMY       Home Medications:  Prior to Admission medications   Medication Sig Start Date End Date Taking? Authorizing Provider  acetaminophen (TYLENOL) 325 MG tablet Take 2 tablets (650 mg total) by mouth every 4 (four) hours as needed for headache or mild pain. 12/02/16  Yes Kilroy, Doreene Burke, PA-C  aspirin 81 MG chewable tablet Chew 81 mg by mouth at bedtime.    Yes [provider]  atorvastatin (LIPITOR) 40 MG tablet Take 1 tablet (40 mg total) by mouth every evening. 05/29/20  Yes Eulogio Bear U, DO  b complex vitamins tablet Take 1 tablet by mouth daily. Patient taking differently: Take 1 tablet by mouth daily with breakfast.  09/25/16  Yes Mosie Lukes, MD  Cholecalciferol (VITAMIN D3) 50 MCG (2000 UT) TABS Take 2,000 Units by mouth in the morning.   Yes [provider]  dolutegravir (TIVICAY) 50 MG tablet TAKE 1 TABLET BY MOUTH DAILY with Pifeltro Patient taking differently: Take 50 mg by mouth See admin instructions. Take 50 mg by mouth in the morning (with Pifeltro) 05/18/20  Yes Michel Bickers, MD  doravirine (PIFELTRO) 100 MG TABS tablet TAKE 1 TABLET(100 MG) BY MOUTH DAILY with Tivicay Patient taking differently: Take 100 mg by mouth See admin instructions. Take 100 mg by mouth in the morning (with Tivicay) 05/18/20  Yes Michel Bickers, MD    fenofibrate 54 MG tablet TAKE 1 TABLET(54 MG) BY MOUTH DAILY Patient taking differently: Take 54 mg by mouth at bedtime.  09/28/19  Yes Almyra Deforest, PA  furosemide (LASIX) 20 MG tablet Take 1 tablet (20 mg total) by mouth daily. 05/29/20  Yes Eulogio Bear U, DO  isosorbide mononitrate (IMDUR) 30 MG 24 hr tablet Take 1 tablet (30 mg total) by mouth daily. 01/16/20  Yes Lelon Perla, MD  losartan (COZAAR) 25 MG tablet Take 1 tablet (25 mg total) by mouth daily. 05/29/20  Yes  Eulogio Bear U, DO  metoprolol tartrate (LOPRESSOR) 25 MG tablet Take 0.5 tablets (12.5 mg total) by mouth 2 (two) times daily. 05/29/20  Yes Vann, Jessica U, DO  nitroGLYCERIN (NITROSTAT) 0.4 MG SL tablet PLACE 1 TABLET UNDER THE TONGUE EVERY 5 MINUTES AS NEEDED FOR CHEST PAIN Patient taking differently: Place 0.4 mg under the tongue every 5 (five) minutes as needed for chest pain.  03/19/20  Yes Lelon Perla, MD  pantoprazole (PROTONIX) 40 MG tablet TAKE 1 TABLET(40 MG) BY MOUTH DAILY Patient taking differently: Take 40 mg by mouth daily before breakfast.  10/12/19  Yes Crenshaw, Denice Bors, MD  potassium chloride SA (KLOR-CON) 20 MEQ tablet TAKE 1 TABLET(20 MEQ) daily Patient taking differently: Take 20 mEq by mouth daily. TAKE 1 TABLET(20 MEQ) daily 05/29/20  Yes Geradine Girt, DO  Probiotic Product (PROBIOTIC DAILY) CAPS Take 1 by mouth daily Patient taking differently: Take 1 capsule by mouth daily with breakfast.  09/25/16  Yes Mosie Lukes, MD  repaglinide (PRANDIN) 2 MG tablet Take 2-4 mg by mouth See admin instructions. Take 4 mg by mouth in the morning before breakfast and 2 mg before lunch and dinner/evening meal   Yes [provider]  sodium bicarbonate 650 MG tablet Take 650 mg by mouth 3 (three) times daily.   Yes [provider]  tamsulosin (FLOMAX) 0.4 MG CAPS capsule Take 0.4 mg by mouth in the morning and at bedtime.  05/19/18  Yes [provider]  ticagrelor (BRILINTA) 90 MG  TABS tablet Take 1 tablet (90 mg total) by mouth 2 (two) times daily. 05/29/20  Yes Vann, Jessica U, DO  valACYclovir (VALTREX) 500 MG tablet TAKE 1 TABLET BY MOUTH DAILY Patient taking differently: Take 500 mg by mouth in the morning.  03/27/20  Yes Michel Bickers, MD  VASCEPA 1 g capsule TAKE 2 CAPSULES(2 GRAMS) BY MOUTH TWICE DAILY Patient taking differently: Take 2 g by mouth 2 (two) times daily.  03/16/20  Yes Elayne Snare, MD  glucose blood (FREESTYLE TEST STRIPS) test strip USE TO CHECK BLOOD SUGAR THREE TIMES DAILY 11/18/19   Elayne Snare, MD  Lancets (FREESTYLE) lancets Use as directed three times a day to check blood sugar.  DX E11.9 11/03/17   Elayne Snare, MD    Inpatient Medications: Scheduled Meds: . [START ON 06/08/2020] b complex vitamins  1 tablet Oral Q breakfast  . dolutegravir  50 mg Oral See admin instructions  . doravirine  100 mg Oral See admin instructions  . insulin aspart  0-15 Units Subcutaneous TID WC  . iohexol      . isosorbide mononitrate  30 mg Oral Daily  . metoprolol tartrate  12.5 mg Oral BID  . [START ON 06/08/2020] pantoprazole  40 mg Oral QAC breakfast  . potassium chloride SA  20 mEq Oral Daily  . [START ON 06/08/2020] Probiotic Daily  1 capsule Oral Q breakfast  . sodium bicarbonate  650 mg Oral TID  . tamsulosin  0.4 mg Oral Daily  . valACYclovir  500 mg Oral q AM  . Vitamin D3  2,000 Units Oral q AM   Continuous Infusions: . sodium chloride 1,000 mL (06/07/20 0612)  . [START ON 06/08/2020] cefTRIAXone (ROCEPHIN)  IV    . lactated ringers    . metronidazole     PRN Meds: nitroGLYCERIN, ondansetron **OR** ondansetron (ZOFRAN) IV  Allergies:   No Known Allergies  Social History:   Social History   Socioeconomic History  .  Marital status: Divorced    Spouse name: Not on file  . Number of children: 4  . Years of education: Not on file  . Highest education level: Not on file  Occupational History  . 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   Tobacco Use  . Smoking status: Never Smoker  . Smokeless tobacco: Never Used  Vaping Use  . Vaping Use: Never used  Substance and Sexual Activity  . Alcohol use: Not Currently    Alcohol/week: 0.0 standard drinks    Comment: rare  . Drug use: No  . Sexual activity: Not Currently    Comment: declined condoms  Other Topics Concern  . Not on file  Social History Narrative   Lives alone.  Supportive friends and family.  His HIV Dx is not a secret.    Social Determinants of Health   Financial Resource Strain: Low Risk   . Difficulty of Paying Living Expenses: Not hard at all  Food Insecurity: No Food Insecurity  . Worried About Charity fundraiser in the Last Year: Never true  . Ran Out of Food in the Last Year: Never true  Transportation Needs: No Transportation Needs  . Lack of Transportation (Medical): No  . Lack of Transportation (Non-Medical): No  Physical Activity: Sufficiently Active  . Days of Exercise per Week: 7 days  . Minutes of Exercise per Session: 40 min  Stress: No Stress Concern Present  . Feeling of Stress : Not at all  Social Connections: Moderately Isolated  . Frequency of Communication with Friends and Family: More than three times a week  . Frequency of Social Gatherings with Friends and Family: Three times a week  . Attends Religious Services: More than 4 times per year  . Active Member of Clubs or Organizations: No  . Attends Archivist Meetings: Never  . Marital Status: Divorced  Human resources officer Violence: Not At Risk  . Fear of Current or Ex-Partner: No  . Emotionally Abused: No  . Physically Abused: No  . Sexually Abused: No    Family History:    Family History  Problem Relation Age of Onset  . Hyperlipidemia Mother   . Diabetes Mother        paternal grandparents/1 brother  . Hyperlipidemia Father   . Hypertension Father        paternal grandmother/3 brothers/1 sister  .  Arthritis Other        mother/father/paternal grandparents  . Breast cancer Maternal Aunt        paternal aunt  . Lung cancer Maternal Aunt   . Heart disease Other        parents/maternal grandparents/ 2 brothers  . Stroke Paternal Grandmother   . Mental retardation Sister   . Colon cancer Neg Hx   . Esophageal cancer Neg Hx   . Pancreatic cancer Neg Hx   . Stomach cancer Neg Hx   . Liver disease Neg Hx   . Colon polyps Neg Hx   . Rectal cancer Neg Hx      ROS:  Please see the history of present illness.   All other ROS reviewed and negative.     Physical Exam/Data:   Vitals:   06/07/20 0815 06/07/20 0828 06/07/20 1112 06/07/20 1200  BP: 108/68  (!) 102/58 114/64  Pulse: (!) 104  85 77  Resp: 20  14 15   Temp:  100.3 F (37.9 C)  TempSrc:  Oral    SpO2: 96%  100% 99%    Intake/Output Summary (Last 24 hours) at 06/07/2020 1316 Last data filed at 06/07/2020 0831 Gross per 24 hour  Intake 1050 ml  Output --  Net 1050 ml   Last 3 Weights 06/05/2020 05/27/2020 05/27/2020  Weight (lbs) 135 lb 1.6 oz 136 lb 14.5 oz 134 lb  Weight (kg) 61.281 kg 62.1 kg 60.782 kg     There is no height or weight on file to calculate BMI.  General:  Well nourished, well developed, in no acute distress HEENT: normal Lymph: no adenopathy Neck: no JVD Endocrine:  No thryomegaly Vascular: No carotid bruits; FA pulses 2+ bilaterally without bruits  Cardiac:  normal S1, S2; RRR; soft systolic murmur  Lungs:  clear to auscultation bilaterally, no wheezing, rhonchi or rales  Abd: soft, nontender, no hepatomegaly  Ext: no edema Musculoskeletal:  No deformities, BUE and BLE strength normal and equal Skin: warm and dry  Neuro:  CNs 2-12 intact, no focal abnormalities noted Psych:  Normal affect   EKG:  The EKG was personally reviewed and demonstrates: Sinus rhythm, LVH, old inferior infarct, prolonged QTC  (similar to previous tracings)   Relevant CV Studies:  Cath:  05/28/20  Conclusions: 1. Severe native coronary artery disease, as detailed below. 2. Widely patent LIMA to diagonal/LAD. 3. Patent SVG to diagonal with 90% in-stent restenosis involving the distal anastomosis. 4. Patent SVG to ramus intermedius with chronic total occlusion of jump portion to OM1.  There is 30 to 40% in-stent restenosis at the ostium of the SVG to ramus intermedius. 5. Chronic total occlusion of sequential SVG to RPDA and RPL. 6. Normal left ventricular filling pressure. 7. Successful PTCA to distal SVG to diagonal in-stent restenosis with 0% residual stenosis and TIMI-3 flow.  Recommendations: 1. Dual antiplatelet therapy with aspirin and ticagrelor for at least 12 months. 2. Aggressive secondary prevention. 3. Gentle post catheterization hydration given chronic kidney disease. 4. Remove right femoral artery sheath 2 hours after discontinuation of bivalirudin.  Nelva Bush, MD [facility]  Diagnostic Dominance: Right  Intervention     Echo: 05/28/20  IMPRESSIONS    1. Left ventricular ejection fraction, by estimation, is 40 to 45%. The  left ventricle has mildly decreased function. The left ventricle  demonstrates regional wall motion abnormalities (see scoring  diagram/findings for description). There is mild left  ventricular hypertrophy. Left ventricular diastolic parameters are  consistent with Grade I diastolic dysfunction (impaired relaxation).  2. Right ventricular systolic function is normal. The right ventricular  size is mildly enlarged. There is mildly elevated pulmonary artery  systolic pressure.  3. Left atrial size was mildly dilated.  4. The mitral valve is grossly normal. No evidence of mitral valve  regurgitation. Moderate mitral annular calcification.  5. The aortic valve is tricuspid. There is mild calcification of the  aortic valve. Aortic valve regurgitation is mild.  6. The inferior vena cava is dilated in size  with <50% respiratory  variability, suggesting right atrial pressure of 15 mmHg.   Comparison(s): A prior study was performed on 01/20/20. New decrease in  LVEF and new WMA's. Primary cardiology team aware.   FINDINGS  Left Ventricle: Left ventricular ejection fraction, by estimation, is 40  to 45%. The left ventricle has mildly decreased function. The left  ventricle demonstrates regional wall motion abnormalities. Definity  contrast agent was given IV to delineate the  left ventricular endocardial borders. The left ventricular internal cavity  size was normal in size. There is mild left ventricular hypertrophy. Left  ventricular diastolic parameters are consistent with Grade I diastolic  dysfunction (impaired relaxation).   Laboratory Data:  High Sensitivity Troponin:   Recent Labs  Lab 05/27/20 1830 05/27/20 2042 05/28/20 0222 06/07/20 0436 06/07/20 0803  TROPONINIHS 154* 188* 1,001* 37* 308*     Chemistry Recent Labs  Lab 06/05/20 1101 06/07/20 0436  NA 140 136  K 4.5 3.6  CL 105 102  CO2 25 19*  GLUCOSE 276* 239*  BUN 30* 28*  CREATININE 2.10* 2.08*  CALCIUM 9.2 8.5*  GFRNONAA 31* 33*  GFRAA 35*  --   ANIONGAP  --  15    Recent Labs  Lab 06/05/20 1101 06/07/20 0436  PROT 6.6 7.5  ALBUMIN  --  3.7  AST 56* 133*  ALT 69* 162*  ALKPHOS  --  79  BILITOT 1.2 6.4*   Hematology Recent Labs  Lab 06/07/20 0436  WBC 3.6*  RBC 4.19*  HGB 13.6  HCT 40.7  MCV 97.1  MCH 32.5  MCHC 33.4  RDW 13.2  PLT 128*   BNP Recent Labs  Lab 06/07/20 0436  BNP 162.2*    DDimer No results for input(s): DDIMER in the last 168 hours.   Radiology/Studies:  CT ABDOMEN PELVIS WO CONTRAST  Result Date: 06/07/2020 CLINICAL DATA:  Left lower quadrant abdominal pain. EXAM: CT ABDOMEN AND PELVIS WITHOUT CONTRAST TECHNIQUE: Multidetector CT imaging of the abdomen and pelvis was performed following the standard protocol without IV contrast. COMPARISON:  March 22, 2014.  FINDINGS: Lower chest: No acute abnormality. Hepatobiliary: Cholelithiasis is noted. There appears to be multiple stones in the distal common bile duct which may be resulting in mild common bile duct dilatation. No definite intrahepatic dilatation is noted. Pancreas: Unremarkable. No pancreatic ductal dilatation or surrounding inflammatory changes. Spleen: Normal in size without focal abnormality. Adrenals/Urinary Tract: Adrenal glands are unremarkable. Kidneys are normal, without renal calculi, focal lesion, or hydronephrosis. Bladder is unremarkable. Stomach/Bowel: Stomach is within normal limits. Appendix appears normal. No evidence of bowel wall thickening, distention, or inflammatory changes. Vascular/Lymphatic: No significant vascular findings are present. No enlarged abdominal or pelvic lymph nodes. Reproductive: Mild prostatic enlargement is noted. Other: No abdominal wall hernia or abnormality. No abdominopelvic ascites. Musculoskeletal: No acute or significant osseous findings. IMPRESSION: 1. Cholelithiasis is noted. There appears to be multiple stones in the distal common bile duct which may be resulting in mild common bile duct dilatation. No definite intrahepatic dilatation is noted. Correlation with liver function tests is recommended to evaluate for distal common bile duct obstruction. 2. Mild prostatic enlargement. Electronically Signed   By: Marijo Conception M.D.   On: 06/07/2020 10:03   DG Chest 2 View  Result Date: 06/07/2020 CLINICAL DATA:  Epigastric pain. EXAM: CHEST - 2 VIEW COMPARISON:  05/27/2020. FINDINGS: Prior CABG. Stable cardiomegaly. Low lung volumes with mild bibasilar atelectasis. No pleural effusion or pneumothorax. Degenerative change thoracic spine. IMPRESSION: 1. Prior CABG. Stable cardiomegaly. 2. Low lung volumes with mild bibasilar atelectasis. Electronically Signed   By: Marcello Moores  Register   On: 06/07/2020 05:21   US Abdomen Limited RUQ (LIVER/GB)  Result Date:  06/07/2020 CLINICAL DATA:  Right upper quadrant pain EXAM: ULTRASOUND ABDOMEN LIMITED RIGHT UPPER QUADRANT COMPARISON:  MRI/MRCP 03/29/2014 FINDINGS: Gallbladder: Tiny stones and sludge noted layering within the gallbladder. No gallbladder wall thickening, inflammation, or distension. Negative sonographic Murphy's sign. Common bile duct: Diameter: 8.9 mm.  No signs  of choledocholithiasis. Liver: No focal lesion identified. Within normal limits in parenchymal echogenicity. Portal vein is patent on color Doppler imaging with normal direction of blood flow towards the liver. Other: None. IMPRESSION: 1. Tiny stones and sludge noted layering within the gallbladder. No secondary signs of acute cholecystitis. 2. Mild increase caliber of the common bile duct which measures up to 8.9 mm. No signs of choledocholithiasis. If there is a clinical concern for biliary obstruction and or choledocholithiasis consider further investigation with MRI/MRCP. Right upper quadrant pain please pick the correct US ABDOMEN LIMITED template. Electronically Signed   By: Kerby Moors M.D.   On: 06/07/2020 07:39     Assessment and Plan:   [name] is a 74 y.o. male with a hx of HIV, CAD status post CABG with multiple PCI, most recent PTCA to distal SVG-for in-stent restenosis (10/25), diabetes, hyperlipidemia, COPD, mild aortic stenosis who is being seen today for the evaluation of preoperative evaluation at the request of Dr. Francine Graven.  1. Preop evaluation with known CAD s/p CABG with recent PTCA SVG-diag (10/25): pt reports he was feeling quite well after discharge last week. No further chest pain, or shortness of breath. Has been compliant with home medication of ASA/Brilinta. Now presenting with acute Pancreatitis and sepsis from cholangitis. Has been seen by GI with plans for ERCP. Needing to hold his Brilinta. Last dose was last evening. Discussed with MD and given just balloon angioplasty, can hold Brilinta for needed  procedures 3-5 days per GI work up. No need for bridging at this time. Would resume as soon possible when safe from a GI standpoint. Would like to continue ASA, will resume.  -- treated with antibiotics per [facility] -- further management via GI   2. Sepsis 2/2 to acute cholangitis: started on antibiotics, further management per [facility] and GI  3. HLD: statin on hold with elevated LFTs  4. DM: SSI   5. HIV: continue HAART  For questions or updates, please contact [facility] Please consult [email or web address] for contact info under    Signed, Reino Bellis, NP  06/07/2020 1:16 PM   Patient seen and examined.  Agree with above documentation.  Mr. Gingerich is a 74 year old male with a history of HIV, CAD status post CABG with multiple subsequent PCI, COPD, mild AS, diabetes who is being seen today for preoperative evaluation at the request of Dr. Francine Graven.  He initially underwent CABG in 2013 with LIMA-LAD, SVG-D1, SVG-ramus with jump to OM1, SVG-RPDA.  Underwent cardiac catheterization on 05/28/2020 with successful PTCA to distal anastomotic stent in SVG-D1.  He was started on ticagrelor at that time.  Echocardiogram at that time showed EF 40 to 45% with posterior mid anterior lateral wall hypokinesis.  He then presented to the ED today with abdominal pain.  Labs notable for creatinine 2, AST 133, ALT 162, BNP 162, high-sensitivity troponin 37>108.  CT abdomen pelvis showed cholelithiasis with multiple stones in the distal common bile duct.  Being evaluated by GI for ERCP.  Cardiology consulted to weigh in on antiplatelet medications given recent PTCA.  On exam, patient is alert and oriented, regular rate and rhythm, no murmurs, lungs CTAB, no LE edema or JVD.  EKG shows sinus rhythm, rate 91, LVH with repolarization changes, inferior Q waves.  He currently denies any chest pain.  In regards to his antiplatelet medications, would continue aspirin 81 mg daily.  OK to hold ticagrelor, as underwent PTCA on 10/25 but  no stent was placed.  Given no recent stenting, okay to hold ticagrelor for procedure.  Would restart as soon as able after procedure.  Donato Heinz, MD

## 2020-06-08 ENCOUNTER — Inpatient Hospital Stay (HOSPITAL_COMMUNITY): Payer: Medicare Other

## 2020-06-08 DIAGNOSIS — I2510 Atherosclerotic heart disease of native coronary artery without angina pectoris: Secondary | ICD-10-CM

## 2020-06-08 DIAGNOSIS — A419 Sepsis, unspecified organism: Secondary | ICD-10-CM | POA: Diagnosis not present

## 2020-06-08 DIAGNOSIS — Z01818 Encounter for other preprocedural examination: Secondary | ICD-10-CM | POA: Diagnosis not present

## 2020-06-08 LAB — CBC
HCT: 32.1 % — ABNORMAL LOW (ref 39.0–52.0)
Hemoglobin: 10.8 g/dL — ABNORMAL LOW (ref 13.0–17.0)
MCH: 31.4 pg (ref 26.0–34.0)
MCHC: 33.6 g/dL (ref 30.0–36.0)
MCV: 93.3 fL (ref 80.0–100.0)
Platelets: 121 10*3/uL — ABNORMAL LOW (ref 150–400)
RBC: 3.44 MIL/uL — ABNORMAL LOW (ref 4.22–5.81)
RDW: 13.6 % (ref 11.5–15.5)
WBC: 5.8 10*3/uL (ref 4.0–10.5)
nRBC: 0 % (ref 0.0–0.2)

## 2020-06-08 LAB — PROCALCITONIN: Procalcitonin: 60.4 ng/mL

## 2020-06-08 LAB — BASIC METABOLIC PANEL
Anion gap: 8 (ref 5–15)
BUN: 22 mg/dL (ref 8–23)
CO2: 21 mmol/L — ABNORMAL LOW (ref 22–32)
Calcium: 7.8 mg/dL — ABNORMAL LOW (ref 8.9–10.3)
Chloride: 113 mmol/L — ABNORMAL HIGH (ref 98–111)
Creatinine, Ser: 1.81 mg/dL — ABNORMAL HIGH (ref 0.61–1.24)
GFR, Estimated: 39 mL/min — ABNORMAL LOW (ref >60)
Glucose, Bld: 113 mg/dL — ABNORMAL HIGH (ref 70–99)
Potassium: 3.5 mmol/L (ref 3.5–5.1)
Sodium: 142 mmol/L (ref 135–145)

## 2020-06-08 LAB — ECHOCARDIOGRAM LIMITED
AV Mean grad: 15 mmHg
AV Peak grad: 25.4 mmHg
Ao pk vel: 2.52 m/s
Area-P 1/2: 3.72 cm2
Height: 63 in
P 1/2 time: 327 msec
S' Lateral: 3.6 cm
Weight: 2158.74 oz

## 2020-06-08 LAB — CORTISOL-AM, BLOOD: Cortisol - AM: 22.1 ug/dL (ref 6.7–22.6)

## 2020-06-08 LAB — TROPONIN I (HIGH SENSITIVITY): Troponin I (High Sensitivity): 3561 ng/L (ref <18)

## 2020-06-08 LAB — GLUCOSE, CAPILLARY
Glucose-Capillary: 131 mg/dL — ABNORMAL HIGH (ref 70–99)
Glucose-Capillary: 143 mg/dL — ABNORMAL HIGH (ref 70–99)
Glucose-Capillary: 130 mg/dL — ABNORMAL HIGH (ref 70–99)
Glucose-Capillary: 196 mg/dL — ABNORMAL HIGH (ref 70–99)

## 2020-06-08 LAB — PROTIME-INR
INR: 1.3 — ABNORMAL HIGH (ref 0.8–1.2)
Prothrombin Time: 16.1 seconds — ABNORMAL HIGH (ref 11.4–15.2)

## 2020-06-08 MED ORDER — LOPERAMIDE HCL 2 MG PO CAPS
2.0000 mg | ORAL_CAPSULE | Freq: Four times a day (QID) | ORAL | Status: DC | PRN
  Administered 2020-06-08 – 2020-06-13 (×3): 2 mg via ORAL
  Filled 2020-06-08 (×3): qty 1

## 2020-06-08 MED ORDER — HYDRALAZINE HCL 20 MG/ML IJ SOLN
10.0000 mg | Freq: Four times a day (QID) | INTRAMUSCULAR | Status: DC | PRN
  Administered 2020-06-08 – 2020-06-09 (×2): 10 mg via INTRAVENOUS
  Filled 2020-06-08: qty 1

## 2020-06-08 NOTE — Consult Note (Addendum)
[place] 05-31-1946  [id number].    Requesting MD: Dr. Tawanna Solo  Chief Complaint/Reason for Consult: Choledocholithiasis   HPI: [name] is a 74 y.o. male with a history of HTN, HLD, HIV, DM2, CHF (EF 40-45% on 05/28/20), CAD (hx of CABG, mult PCI with most recent angioplasty on 05/28/20 for in-stent restenosis) on Brilinta and ASA who presented on 06/07/20 with abdominal pain and nausea.   Patient reports around 1am yesterday he woke with severe, constant, epigastric abdominal pain without radiation. He notes associated nausea. No fever, chills, cp, sob, emesis, diarrhea or urinary symptoms. No aggravating or relieving factors. No hx of similar symptoms in the past or biliary colic. He presented to the ED for evaluation. Patient was found to have elevated LFTs w/ T bili 6.4. Imaging revealed Cholelithiasis and Choledocholithiasis without evidence of Cholecystitis. Blood cultures + for E. Coli. Patient was admitted to medicine. GI consulted and plans for ERCP Monday after Brilinta held. Cardiology consulted and stated okay to hold Brilinta and recommended continuing ASA 81mg  daily. We were asked to see. Patient reports no prior abdominal surgeries.   ROS: Review of Systems  Constitutional: Negative for chills and fever.  Respiratory: Negative for cough and shortness of breath.   Cardiovascular: Negative for chest pain.  Gastrointestinal: Positive for abdominal pain and nausea. Negative for constipation, diarrhea and vomiting.  Genitourinary: Negative for dysuria.  Musculoskeletal: Negative for back pain.  Psychiatric/Behavioral: Negative for substance abuse.  All other systems reviewed and are negative.   Family History  Problem Relation Age of Onset  . Hyperlipidemia Mother   . Diabetes Mother        paternal grandparents/1 brother  . Hyperlipidemia Father   . Hypertension Father        paternal grandmother/3 brothers/1 sister  . Arthritis Other         mother/father/paternal grandparents  . Breast cancer Maternal Aunt        paternal aunt  . Lung cancer Maternal Aunt   . Heart disease Other        parents/maternal grandparents/ 2 brothers  . Stroke Paternal Grandmother   . Mental retardation Sister   . Colon cancer Neg Hx   . Esophageal cancer Neg Hx   . Pancreatic cancer Neg Hx   . Stomach cancer Neg Hx   . Liver disease Neg Hx   . Colon polyps Neg Hx   . Rectal cancer Neg Hx     Past Medical History:  Diagnosis Date  . Absolute anemia 05/28/2014  . Aortic stenosis 09/17/2015  . Arthritis   . Ascites 11/18/2013  . BELLS PALSY 07/19/2010   Qualifier: Diagnosis of  By: [name], [name]    . CAD (coronary artery disease)    a. s/p CABG in 2003 b. s/p PCI to SVG-PDA in 07/2015 c. 11/2016: cath showing severe native CAD with patent LIMA-LAD and SVG-D1 with 80% stenosis of SVG-OM1-OM2. Initially medical management was recommended --> presented with recurrent angina --> s/p Synergy DES to proximal body of SVG-OM1-OM2, POBA to distal graft.   Marland Kitchen CAD- S/P PCI SVG-OM1 12/01/16 05/12/2006   Qualifier: Diagnosis of  By: [name], [name]    . Carotid bruit 07/10/2015  . Chest pain 11/22/2016  . Chronic kidney disease   . Chronic renal insufficiency, stage III (moderate) ([place]) 12/02/2016  . Constipation 05/28/2014  . COPD 07/20/2008   Qualifier: Diagnosis of  By: [name], [name]   . DEPRESSION 09/03/2006  Qualifier: Diagnosis of  By: [name], [name]    . Dermatitis 11/27/2012  . Diabetes mellitus   . Diarrhea 11/20/2013  . DM (diabetes mellitus), type 2 with ophthalmic complications ([place]) 1/49/7026   Qualifier: Diagnosis of  By: [name], [name]    . Dry eye syndrome 12/20/2010  . Epicondylitis 06/05/2013   right  . Erectile dysfunction 01/01/2012  . Essential hypertension 05/12/2006   Qualifier: Diagnosis of  By: [name], [name]    . GENITAL HERPES 05/03/2009   Qualifier: Diagnosis of  By: [name], [name]    . GERD 09/03/2006    Qualifier: Diagnosis of  By: [name], [name]    . GERD (gastroesophageal reflux disease)   . HEARING LOSS, SENSORINEURAL 05/12/2006   Qualifier: Diagnosis of  By: [name], [name]    . HEMATOCHEZIA 04/18/2008   Annotation: 9/09 during bout of constipation Qualifier: Diagnosis of  By: [name], [name]    . HIP PAIN, BILATERAL 07/17/2008   Qualifier: Diagnosis of  By: [name], [name]   . History of depression   . History of kidney stones   . HIV (human immunodeficiency virus infection) ([place]) 1991   on meds since initial dx.   Marland Kitchen HLD (hyperlipidemia) 11/19/2013  . Human immunodeficiency virus (HIV) disease ([place]) 05/12/2006   Qualifier: Diagnosis of  By: [name], [name]    . Hx of CABG 09/03/2006   Annotation: 2003 Qualifier: Diagnosis of  By: [name], [name]    . Hyperkalemia 03/23/2014  . Hyperlipidemia   . Hypertension   . Hyponatremia 03/23/2014  . INGUINAL LYMPHADENOPATHY, RIGHT 04/03/2009   Qualifier: Diagnosis of  By: [name], [name]    . Keratoma 01/24/2015  . KNEE PAIN, BILATERAL 07/17/2008   Qualifier: Diagnosis of  By: [name], [name]   . Lesion of breast 07/21/2015  . Lipodystrophy 12/20/2010  . Memory loss 09/18/2008   Qualifier: Diagnosis of  By: [name], [name]   . Metatarsal deformity 01/24/2015  . Nasal abscess 12/04/2015  . Night sweats 07/06/2012  . Nocturia 03/06/2013  . NSTEMI (non-ST elevated myocardial infarction) ([place]) 11/29/2016  . Pain in joint, ankle and foot 01/19/2015  . Pancreatitis 11/2013   attributed to HIV meds.   . Pedal edema 05/21/2014  . PERIPHERAL VASCULAR DISEASE 07/20/2008   Qualifier: Diagnosis of  By: [name], [name]   . Posterior cervical lymphadenopathy 03/30/2014  . Protein-calorie malnutrition, severe ([place]) 03/23/2014  . SHINGLES, HX OF 05/03/2009   Annotation: R leg Qualifier: Diagnosis of  By: [name], [name]    . STEMI (ST elevation myocardial infarction) ([place]) 07/29/2015  . Unstable angina ([place]) 11/24/2016  . WEIGHT LOSS, ABNORMAL  04/03/2009   Qualifier: Diagnosis of  By: [name], [name]      Past Surgical History:  Procedure Laterality Date  . CARDIAC CATHETERIZATION N/A 07/29/2015   Procedure: Left Heart Cath and Coronary Angiography;  Surgeon: [name], MD;  Location: [facility];  Service: Cardiovascular;  Laterality: N/A;  . CARDIAC CATHETERIZATION N/A 07/29/2015   Procedure: Coronary Stent Intervention;  Surgeon: [name], MD;  Location: [facility];  Service: Cardiovascular;  Laterality: N/A;  . COLONOSCOPY  12/02/2019  . CORONARY ARTERY BYPASS GRAFT  05/2002  . CORONARY BALLOON ANGIOPLASTY N/A 05/28/2020   Procedure: CORONARY BALLOON ANGIOPLASTY;  Surgeon: Nelva Bush, MD;  Location: [facility];  Service: Cardiovascular;  Laterality: N/A;  . CORONARY STENT INTERVENTION  N/A 12/01/2016   Procedure: Coronary Stent Intervention;  Surgeon: [name], MD;  Location: [facility];  Service: Cardiovascular;  Laterality: N/A;  . CORONARY STENT INTERVENTION N/A 10/21/2017   Procedure: CORONARY STENT INTERVENTION;  Surgeon: Jettie Booze, MD;  Location: [facility];  Service: Cardiovascular;  Laterality: N/A;  . LEFT HEART CATH AND CORS/GRAFTS ANGIOGRAPHY N/A 11/24/2016   Procedure: Left Heart Cath and Cors/Grafts Angiography;  Surgeon: Nelva Bush, MD;  Location: [facility];  Service: Cardiovascular;  Laterality: N/A;  . LEFT HEART CATH AND CORS/GRAFTS ANGIOGRAPHY N/A 12/01/2016   Procedure: Left Heart Cath and Cors/Grafts Angiography;  Surgeon: [name], MD;  Location: [facility];  Service: Cardiovascular;  Laterality: N/A;  . LEFT HEART CATH AND CORS/GRAFTS ANGIOGRAPHY N/A 10/21/2017   Procedure: LEFT HEART CATH AND CORS/GRAFTS ANGIOGRAPHY;  Surgeon: Jettie Booze, MD;  Location: [facility];  Service: Cardiovascular;  Laterality: N/A;  . LEFT HEART CATH AND CORS/GRAFTS ANGIOGRAPHY N/A 05/28/2020   Procedure: LEFT  HEART CATH AND CORS/GRAFTS ANGIOGRAPHY;  Surgeon: Nelva Bush, MD;  Location: [facility];  Service: Cardiovascular;  Laterality: N/A;  . VASECTOMY      Social History:  reports that he has never smoked. He has never used smokeless tobacco. He reports previous alcohol use. He reports that he does not use drugs.  Allergies: No Known Allergies  Medications Prior to Admission  Medication Sig Dispense Refill  . acetaminophen (TYLENOL) 325 MG tablet Take 2 tablets (650 mg total) by mouth every 4 (four) hours as needed for headache or mild pain.    Marland Kitchen aspirin 81 MG chewable tablet Chew 81 mg by mouth at bedtime.     Marland Kitchen atorvastatin (LIPITOR) 40 MG tablet Take 1 tablet (40 mg total) by mouth every evening. 30 tablet 0  . b complex vitamins tablet Take 1 tablet by mouth daily. (Patient taking differently: Take 1 tablet by mouth daily with breakfast. ) 30 tablet 11  . Cholecalciferol (VITAMIN D3) 50 MCG (2000 UT) TABS Take 2,000 Units by mouth in the morning.    . dolutegravir (TIVICAY) 50 MG tablet TAKE 1 TABLET BY MOUTH DAILY with Pifeltro (Patient taking differently: Take 50 mg by mouth See admin instructions. Take 50 mg by mouth in the morning (with Pifeltro)) 30 tablet 5  . doravirine (PIFELTRO) 100 MG TABS tablet TAKE 1 TABLET(100 MG) BY MOUTH DAILY with Tivicay (Patient taking differently: Take 100 mg by mouth See admin instructions. Take 100 mg by mouth in the morning (with Tivicay)) 30 tablet 5  . fenofibrate 54 MG tablet TAKE 1 TABLET(54 MG) BY MOUTH DAILY (Patient taking differently: Take 54 mg by mouth at bedtime. ) 90 tablet 2  . furosemide (LASIX) 20 MG tablet Take 1 tablet (20 mg total) by mouth daily. 180 tablet 1  . isosorbide mononitrate (IMDUR) 30 MG 24 hr tablet Take 1 tablet (30 mg total) by mouth daily. 90 tablet 2  . losartan (COZAAR) 25 MG tablet Take 1 tablet (25 mg total) by mouth daily. 30 tablet 1  . metoprolol tartrate (LOPRESSOR) 25 MG tablet Take 0.5 tablets (12.5  mg total) by mouth 2 (two) times daily. 30 tablet 0  . nitroGLYCERIN (NITROSTAT) 0.4 MG SL tablet PLACE 1 TABLET UNDER THE TONGUE EVERY 5 MINUTES AS NEEDED FOR CHEST PAIN (Patient taking differently: Place 0.4 mg under the tongue every 5 (five) minutes as needed for chest pain. ) 25  tablet 3  . pantoprazole (PROTONIX) 40 MG tablet TAKE 1 TABLET(40 MG) BY MOUTH DAILY (Patient taking differently: Take 40 mg by mouth daily before breakfast. ) 30 tablet 11  . potassium chloride SA (KLOR-CON) 20 MEQ tablet TAKE 1 TABLET(20 MEQ) daily (Patient taking differently: Take 20 mEq by mouth daily. TAKE 1 TABLET(20 MEQ) daily) 270 tablet 1  . Probiotic Product (PROBIOTIC DAILY) CAPS Take 1 by mouth daily (Patient taking differently: Take 1 capsule by mouth daily with breakfast. ) 30 capsule 11  . repaglinide (PRANDIN) 2 MG tablet Take 2-4 mg by mouth See admin instructions. Take 4 mg by mouth in the morning before breakfast and 2 mg before lunch and dinner/evening meal    . sodium bicarbonate 650 MG tablet Take 650 mg by mouth 3 (three) times daily.    . tamsulosin (FLOMAX) 0.4 MG CAPS capsule Take 0.4 mg by mouth in the morning and at bedtime.   11  . ticagrelor (BRILINTA) 90 MG TABS tablet Take 1 tablet (90 mg total) by mouth 2 (two) times daily. 60 tablet 0  . valACYclovir (VALTREX) 500 MG tablet TAKE 1 TABLET BY MOUTH DAILY (Patient taking differently: Take 500 mg by mouth in the morning. ) 30 tablet 5  . VASCEPA 1 g capsule TAKE 2 CAPSULES(2 GRAMS) BY MOUTH TWICE DAILY (Patient taking differently: Take 2 g by mouth 2 (two) times daily. ) 120 capsule 2  . glucose blood (FREESTYLE TEST STRIPS) test strip USE TO CHECK BLOOD SUGAR THREE TIMES DAILY 300 each 3  . Lancets (FREESTYLE) lancets Use as directed three times a day to check blood sugar.  DX E11.9 100 each 5     Physical Exam: Blood pressure (!) 153/62, pulse 63, temperature 97.9 F (36.6 C), temperature source Oral, resp. rate 17, height 5\' 3"  (1.6 m),  weight 61.2 kg, SpO2 96 %. General: pleasant, WD/WN white male who is laying in bed in NAD HEENT: head is normocephalic, atraumatic.  Sclera are noninjected.  PERRL.  Ears and nose without any masses or lesions.  Mouth is pink and moist. Dentition fair Heart: regular, rate, and rhythm.  Normal s1,s2. No obvious murmurs, gallops, or rubs noted.  Palpable pedal pulses bilaterally  Lungs: CTAB, no wheezes, rhonchi, or rales noted.  Respiratory effort nonlabored Abd: Soft, NT/ND, +BS, no masses, hernias, or organomegaly MS: no BUE/BLE edema, calves soft and nontender Skin: warm and dry with no masses, lesions, or rashes Psych: A&Ox4 with an appropriate affect Neuro: cranial nerves grossly intact, equal strength in BUE/BLE bilaterally, normal speech, thought process intact. Gait not assessed.  Results for orders placed or performed during the hospital encounter of 06/07/20 (from the past 48 hour(s))  CBC with Differential/Platelet     Status: Abnormal   Collection Time: 06/07/20  4:36 AM  Result Value Ref Range   WBC 3.6 (L) 4.0 - 10.5 K/uL   RBC 4.19 (L) 4.22 - 5.81 MIL/uL   Hemoglobin 13.6 13.0 - 17.0 g/dL   HCT 40.7 39 - 52 %   MCV 97.1 80.0 - 100.0 fL   MCH 32.5 26.0 - 34.0 pg   MCHC 33.4 30.0 - 36.0 g/dL   RDW 13.2 11.5 - 15.5 %   Platelets 128 (L) 150 - 400 K/uL   nRBC 0.0 0.0 - 0.2 %   Neutrophils Relative % 94 %   Neutro Abs 3.4 1.7 - 7.7 K/uL   Lymphocytes Relative 4 %   Lymphs Abs 0.2 (L) 0.7 -  4.0 K/uL   Monocytes Relative 1 %   Monocytes Absolute 0.0 (L) 0.1 - 1.0 K/uL   Eosinophils Relative 1 %   Eosinophils Absolute 0.0 0.0 - 0.5 K/uL   Basophils Relative 0 %   Basophils Absolute 0.0 0.0 - 0.1 K/uL   Immature Granulocytes 0 %   Abs Immature Granulocytes 0.00 0.00 - 0.07 K/uL    Comment: Performed at [facility], [place] [address], [place], [place] [address]  Comprehensive metabolic panel     Status: Abnormal   Collection Time: 06/07/20  4:36 AM  Result Value Ref  Range   Sodium 136 135 - 145 mmol/L   Potassium 3.6 3.5 - 5.1 mmol/L   Chloride 102 98 - 111 mmol/L   CO2 19 (L) 22 - 32 mmol/L   Glucose, Bld 239 (H) 70 - 99 mg/dL    Comment: Glucose reference range applies only to samples taken after fasting for at least 8 hours.   BUN 28 (H) 8 - 23 mg/dL   Creatinine, Ser 2.08 (H) 0.61 - 1.24 mg/dL   Calcium 8.5 (L) 8.9 - 10.3 mg/dL   Total Protein 7.5 6.5 - 8.1 g/dL   Albumin 3.7 3.5 - 5.0 g/dL   AST 133 (H) 15 - 41 U/L   ALT 162 (H) 0 - 44 U/L   Alkaline Phosphatase 79 38 - 126 U/L   Total Bilirubin 6.4 (H) 0.3 - 1.2 mg/dL   GFR, Estimated 33 (L) >60 mL/min    Comment: (NOTE) Calculated using the CKD-EPI Creatinine Equation (2021)    Anion gap 15 5 - 15    Comment: Performed at [facility], [place] [address] [place], [place] [address]  Lipase, blood     Status: Abnormal   Collection Time: 06/07/20  4:36 AM  Result Value Ref Range   Lipase 66 (H) 11 - 51 U/L    Comment: Performed at [facility] [address], [place], [place] [address]  Troponin I (High Sensitivity)     Status: Abnormal   Collection Time: 06/07/20  4:36 AM  Result Value Ref Range   Troponin I (High Sensitivity) 37 (H) <18 ng/L    Comment: (NOTE) Elevated high sensitivity troponin I (hsTnI) values and significant  changes across serial measurements may suggest ACS but many other  chronic and acute conditions are known to elevate hsTnI results.  Refer to the "Links" section for chest pain algorithms and additional  guidance. Performed at [facility], [place] [address], [place], [place] [address]   Brain natriuretic peptide     Status: Abnormal   Collection Time: 06/07/20  4:36 AM  Result Value Ref Range   B Natriuretic Peptide 162.2 (H) 0.0 - 100.0 pg/mL    Comment: Performed at [facility] [address], [place], [place] [address]  Respiratory Panel by RT PCR (Flu A&B, Covid) - Nasopharyngeal Swab     Status: None   Collection Time:  06/07/20  5:51 AM   Specimen: Nasopharyngeal Swab  Result Value Ref Range   SARS Coronavirus 2 by RT PCR NEGATIVE NEGATIVE    Comment: (NOTE) SARS-CoV-2 target nucleic acids are NOT DETECTED.  The SARS-CoV-2 RNA is generally detectable in upper respiratoy specimens during the acute phase of infection. The lowest concentration of SARS-CoV-2 viral copies this assay can detect is 131 copies/mL. A negative result does not preclude SARS-Cov-2 infection and should not be used as the sole basis for  treatment or other patient management decisions. A negative result may occur with  improper specimen collection/handling, submission of specimen other than nasopharyngeal swab, presence of viral mutation(s) within the areas targeted by this assay, and inadequate number of viral copies (<131 copies/mL). A negative result must be combined with clinical observations, patient history, and epidemiological information. The expected result is Negative.  Fact Sheet for Patients:  PinkCheek.be  Fact Sheet for Healthcare Providers:  GravelBags.it  This test is no t yet approved or cleared by the [place] FDA and  has been authorized for detection and/or diagnosis of SARS-CoV-2 by FDA under an Emergency Use Authorization (EUA). This EUA will remain  in effect (meaning this test can be used) for the duration of the COVID-19 declaration under Section 564(b)(1) of the Act, 21 U.S.C. section 360bbb-3(b)(1), unless the authorization is terminated or revoked sooner.     Influenza A by PCR NEGATIVE NEGATIVE   Influenza B by PCR NEGATIVE NEGATIVE    Comment: (NOTE) The Xpert Xpress SARS-CoV-2/FLU/RSV assay is intended as an aid in  the diagnosis of influenza from Nasopharyngeal swab specimens and  should not be used as a sole basis for treatment. Nasal washings and  aspirates are unacceptable for Xpert Xpress SARS-CoV-2/FLU/RSV  testing.  Fact  Sheet for Patients: PinkCheek.be  Fact Sheet for Healthcare Providers: GravelBags.it  This test is not yet approved or cleared by the [place] FDA and  has been authorized for detection and/or diagnosis of SARS-CoV-2 by  FDA under an Emergency Use Authorization (EUA). This EUA will remain  in effect (meaning this test can be used) for the duration of the  Covid-19 declaration under Section 564(b)(1) of the Act, 21  U.S.C. section 360bbb-3(b)(1), unless the authorization is  terminated or revoked. Performed at [facility], [place] [address], [place], [place] [address]   Lactic acid, plasma     Status: None   Collection Time: 06/07/20  5:52 AM  Result Value Ref Range   Lactic Acid, Venous 1.5 0.5 - 1.9 mmol/L    Comment: Performed at [facility] [address] [place], [place] [address]  Protime-INR     Status: Abnormal   Collection Time: 06/07/20  5:52 AM  Result Value Ref Range   Prothrombin Time 16.2 (H) 11.4 - 15.2 seconds   INR 1.4 (H) 0.8 - 1.2    Comment: (NOTE) INR goal varies based on device and disease states. Performed at [facility], [place] [address] [place], [place] [address]   APTT     Status: None   Collection Time: 06/07/20  5:52 AM  Result Value Ref Range   aPTT 29 24 - 36 seconds    Comment: Performed at [facility] [name], [place], [place] [address]  Blood culture (routine single)     Status: Abnormal (Preliminary result)   Collection Time: 06/07/20  5:52 AM   Specimen: BLOOD  Result Value Ref Range   Specimen Description BLOOD SITE NOT SPECIFIED    Special Requests      BOTTLES DRAWN AEROBIC AND ANAEROBIC Blood Culture adequate volume   Culture  Setup Time      GRAM NEGATIVE RODS IN BOTH AEROBIC AND ANAEROBIC BOTTLES CRITICAL RESULT CALLED TO, READ BACK BY AND VERIFIED WITH: PHARMD G ABBOTT 06/07/20 AT 2313 SK    Culture (A)     ESCHERICHIA  COLI SUSCEPTIBILITIES TO FOLLOW Performed at [facility], [place] [address],  [place], [place] [address]    Report Status PENDING   Blood Culture ID Panel (Reflexed)     Status: Abnormal   Collection Time: 06/07/20  5:52 AM  Result Value Ref Range   Enterococcus faecalis NOT DETECTED NOT DETECTED   Enterococcus Faecium NOT DETECTED NOT DETECTED   Listeria monocytogenes NOT DETECTED NOT DETECTED   Staphylococcus species NOT DETECTED NOT DETECTED   Staphylococcus aureus (BCID) NOT DETECTED NOT DETECTED   Staphylococcus epidermidis NOT DETECTED NOT DETECTED   Staphylococcus lugdunensis NOT DETECTED NOT DETECTED   Streptococcus species NOT DETECTED NOT DETECTED   Streptococcus agalactiae NOT DETECTED NOT DETECTED   Streptococcus pneumoniae NOT DETECTED NOT DETECTED   Streptococcus pyogenes NOT DETECTED NOT DETECTED   A.calcoaceticus-baumannii NOT DETECTED NOT DETECTED   Bacteroides fragilis NOT DETECTED NOT DETECTED   Enterobacterales DETECTED (A) NOT DETECTED    Comment: Enterobacterales represent a large order of gram negative bacteria, not a single organism. CRITICAL RESULT CALLED TO, READ BACK BY AND VERIFIED WITH: PHARMD G ABBOTT 06/07/20 AT 2313 SK    Enterobacter cloacae complex NOT DETECTED NOT DETECTED   Escherichia coli DETECTED (A) NOT DETECTED    Comment: CRITICAL RESULT CALLED TO, READ BACK BY AND VERIFIED WITH: PHARMD G ABBOTT 06/07/20 AT 2313 SK    Klebsiella aerogenes NOT DETECTED NOT DETECTED   Klebsiella oxytoca NOT DETECTED NOT DETECTED   Klebsiella pneumoniae NOT DETECTED NOT DETECTED   Proteus species NOT DETECTED NOT DETECTED   Salmonella species NOT DETECTED NOT DETECTED   Serratia marcescens NOT DETECTED NOT DETECTED   Haemophilus influenzae NOT DETECTED NOT DETECTED   Neisseria meningitidis NOT DETECTED NOT DETECTED   Pseudomonas aeruginosa NOT DETECTED NOT DETECTED   Stenotrophomonas maltophilia NOT DETECTED NOT DETECTED   Candida albicans NOT  DETECTED NOT DETECTED   Candida auris NOT DETECTED NOT DETECTED   Candida glabrata NOT DETECTED NOT DETECTED   Candida krusei NOT DETECTED NOT DETECTED   Candida parapsilosis NOT DETECTED NOT DETECTED   Candida tropicalis NOT DETECTED NOT DETECTED   Cryptococcus neoformans/gattii NOT DETECTED NOT DETECTED   CTX-M ESBL NOT DETECTED NOT DETECTED   Carbapenem resistance IMP NOT DETECTED NOT DETECTED   Carbapenem resistance KPC NOT DETECTED NOT DETECTED   Carbapenem resistance NDM NOT DETECTED NOT DETECTED   Carbapenem resist OXA 48 LIKE NOT DETECTED NOT DETECTED   Carbapenem resistance VIM NOT DETECTED NOT DETECTED    Comment: Performed at [facility], [address], [place], [place] [address]  Urinalysis, Routine w reflex microscopic     Status: Abnormal   Collection Time: 06/07/20  6:09 AM  Result Value Ref Range   Color, Urine AMBER (A) YELLOW    Comment: BIOCHEMICALS MAY BE AFFECTED BY COLOR   APPearance CLEAR CLEAR   Specific Gravity, Urine 1.014 1.005 - 1.030   pH 5.0 5.0 - 8.0   Glucose, UA >=500 (A) NEGATIVE mg/dL   Hgb urine dipstick SMALL (A) NEGATIVE   Bilirubin Urine NEGATIVE NEGATIVE   Ketones, ur 5 (A) NEGATIVE mg/dL   Protein, ur 100 (A) NEGATIVE mg/dL   Nitrite NEGATIVE NEGATIVE   Leukocytes,Ua NEGATIVE NEGATIVE   RBC / HPF 0-5 0 - 5 RBC/hpf   WBC, UA 0-5 0 - 5 WBC/hpf   Bacteria, UA NONE SEEN NONE SEEN   Squamous Epithelial / LPF 0-5 0 - 5    Comment: Performed at [facility], [place] [address] [place], [place] [address]  Lactic acid, plasma  Status: None   Collection Time: 06/07/20  8:03 AM  Result Value Ref Range   Lactic Acid, Venous 1.0 0.5 - 1.9 mmol/L    Comment: Performed at [facility] [address] [place], [place] [address]  Troponin I (High Sensitivity)     Status: Abnormal   Collection Time: 06/07/20  8:03 AM  Result Value Ref Range   Troponin I (High Sensitivity) 308 (HH) <18 ng/L    Comment: CRITICAL RESULT CALLED TO,  READ BACK BY AND VERIFIED WITH: CAGLE,Z RN @0932  ON [id number] BY FLEMINGS (NOTE) Elevated high sensitivity troponin I (hsTnI) values and significant  changes across serial measurements may suggest ACS but many other  chronic and acute conditions are known to elevate hsTnI results.  Refer to the Links section for chest pain algorithms and additional  guidance. Performed at [facility], [place] [address] [place], [place] [address]   Hepatitis panel, acute     Status: None   Collection Time: 06/07/20  8:03 AM  Result Value Ref Range   Hepatitis B Surface Ag NON REACTIVE NON REACTIVE   HCV Ab NON REACTIVE NON REACTIVE    Comment: (NOTE) Nonreactive HCV antibody screen is consistent with no HCV infections,  unless recent infection is suspected or other evidence exists to indicate HCV infection.     Hep A IgM NON REACTIVE NON REACTIVE   Hep B C IgM NON REACTIVE NON REACTIVE    Comment: Performed at [facility], [place] [address], [place], [place] [address]  CBG monitoring, ED     Status: Abnormal   Collection Time: 06/07/20 11:53 AM  Result Value Ref Range   Glucose-Capillary 150 (H) 70 - 99 mg/dL    Comment: Glucose reference range applies only to samples taken after fasting for at least 8 hours.  Glucose, capillary     Status: Abnormal   Collection Time: 06/07/20  4:05 PM  Result Value Ref Range   Glucose-Capillary 178 (H) 70 - 99 mg/dL    Comment: Glucose reference range applies only to samples taken after fasting for at least 8 hours.  Glucose, capillary     Status: Abnormal   Collection Time: 06/07/20  9:10 PM  Result Value Ref Range   Glucose-Capillary 141 (H) 70 - 99 mg/dL    Comment: Glucose reference range applies only to samples taken after fasting for at least 8 hours.  Protime-INR     Status: Abnormal   Collection Time: 06/08/20  3:12 AM  Result Value Ref Range   Prothrombin Time 16.1 (H) 11.4 - 15.2 seconds   INR 1.3 (H) 0.8 - 1.2    Comment: (NOTE) INR  goal varies based on device and disease states. Performed at [facility], [place] [address], [place], [place] [address]   Cortisol-am, blood     Status: None   Collection Time: 06/08/20  3:12 AM  Result Value Ref Range   Cortisol - AM 22.1 6.7 - 22.6 ug/dL    Comment: Performed at [facility], [place] [address] [place], [place] [address]  Procalcitonin     Status: None   Collection Time: 06/08/20  3:12 AM  Result Value Ref Range   Procalcitonin 60.40 ng/mL    Comment:        Interpretation: PCT >= 10 ng/mL: Important systemic inflammatory response, almost exclusively due to severe bacterial sepsis or septic shock. (NOTE)       Sepsis PCT Algorithm  Lower Respiratory Tract                                      Infection PCT Algorithm    ----------------------------     ----------------------------         PCT < 0.25 ng/mL                PCT < 0.10 ng/mL          Strongly encourage             Strongly discourage   discontinuation of antibiotics    initiation of antibiotics    ----------------------------     -----------------------------       PCT 0.25 - 0.50 ng/mL            PCT 0.10 - 0.25 ng/mL               OR       >80% decrease in PCT            Discourage initiation of                                            antibiotics      Encourage discontinuation           of antibiotics    ----------------------------     -----------------------------         PCT >= 0.50 ng/mL              PCT 0.26 - 0.50 ng/mL                AND       <80% decrease in PCT             Encourage initiation of                                             antibiotics       Encourage continuation           of antibiotics    ----------------------------     -----------------------------        PCT >= 0.50 ng/mL                  PCT > 0.50 ng/mL               AND         increase in PCT                  Strongly encourage                                      initiation of  antibiotics    Strongly encourage escalation           of antibiotics                                     -----------------------------  PCT <= 0.25 ng/mL                                                 OR                                        > 80% decrease in PCT                                      Discontinue / Do not initiate                                             antibiotics  Performed at [facility], [place] [address] [place], [place] [address]   Basic metabolic panel     Status: Abnormal   Collection Time: 06/08/20  3:12 AM  Result Value Ref Range   Sodium 142 135 - 145 mmol/L   Potassium 3.5 3.5 - 5.1 mmol/L   Chloride 113 (H) 98 - 111 mmol/L   CO2 21 (L) 22 - 32 mmol/L   Glucose, Bld 113 (H) 70 - 99 mg/dL    Comment: Glucose reference range applies only to samples taken after fasting for at least 8 hours.   BUN 22 8 - 23 mg/dL   Creatinine, Ser 1.81 (H) 0.61 - 1.24 mg/dL   Calcium 7.8 (L) 8.9 - 10.3 mg/dL   GFR, Estimated 39 (L) >60 mL/min    Comment: (NOTE) Calculated using the CKD-EPI Creatinine Equation (2021)    Anion gap 8 5 - 15    Comment: Performed at [facility] [address], [place], [place] [address]  CBC     Status: Abnormal   Collection Time: 06/08/20  3:12 AM  Result Value Ref Range   WBC 5.8 4.0 - 10.5 K/uL   RBC 3.44 (L) 4.22 - 5.81 MIL/uL   Hemoglobin 10.8 (L) 13.0 - 17.0 g/dL   HCT 32.1 (L) 39 - 52 %   MCV 93.3 80.0 - 100.0 fL   MCH 31.4 26.0 - 34.0 pg   MCHC 33.6 30.0 - 36.0 g/dL   RDW 13.6 11.5 - 15.5 %   Platelets 121 (L) 150 - 400 K/uL   nRBC 0.0 0.0 - 0.2 %    Comment: Performed at [facility], [place] [address] [place], [place] [address]  Glucose, capillary     Status: Abnormal   Collection Time: 06/08/20  7:28 AM  Result Value Ref Range   Glucose-Capillary 131 (H) 70 - 99 mg/dL    Comment: Glucose reference range applies only to samples taken after fasting for at  least 8 hours.  Glucose, capillary     Status: Abnormal   Collection Time: 06/08/20 12:01 PM  Result Value Ref Range   Glucose-Capillary 143 (H) 70 - 99 mg/dL    Comment: Glucose reference range applies only to samples taken after fasting for at least 8 hours.   *Note: Due to a large number of results and/or encounters for the requested time period, some results have  not been displayed. A complete set of results can be found in Results Review.   CT ABDOMEN PELVIS WO CONTRAST  Result Date: 06/07/2020 CLINICAL DATA:  Left lower quadrant abdominal pain. EXAM: CT ABDOMEN AND PELVIS WITHOUT CONTRAST TECHNIQUE: Multidetector CT imaging of the abdomen and pelvis was performed following the standard protocol without IV contrast. COMPARISON:  March 22, 2014. FINDINGS: Lower chest: No acute abnormality. Hepatobiliary: Cholelithiasis is noted. There appears to be multiple stones in the distal common bile duct which may be resulting in mild common bile duct dilatation. No definite intrahepatic dilatation is noted. Pancreas: Unremarkable. No pancreatic ductal dilatation or surrounding inflammatory changes. Spleen: Normal in size without focal abnormality. Adrenals/Urinary Tract: Adrenal glands are unremarkable. Kidneys are normal, without renal calculi, focal lesion, or hydronephrosis. Bladder is unremarkable. Stomach/Bowel: Stomach is within normal limits. Appendix appears normal. No evidence of bowel wall thickening, distention, or inflammatory changes. Vascular/Lymphatic: No significant vascular findings are present. No enlarged abdominal or pelvic lymph nodes. Reproductive: Mild prostatic enlargement is noted. Other: No abdominal wall hernia or abnormality. No abdominopelvic ascites. Musculoskeletal: No acute or significant osseous findings. IMPRESSION: 1. Cholelithiasis is noted. There appears to be multiple stones in the distal common bile duct which may be resulting in mild common bile duct dilatation. No  definite intrahepatic dilatation is noted. Correlation with liver function tests is recommended to evaluate for distal common bile duct obstruction. 2. Mild prostatic enlargement. Electronically Signed   By: Marijo Conception M.D.   On: 06/07/2020 10:03   DG Chest 2 View  Result Date: 06/07/2020 CLINICAL DATA:  Epigastric pain. EXAM: CHEST - 2 VIEW COMPARISON:  05/27/2020. FINDINGS: Prior CABG. Stable cardiomegaly. Low lung volumes with mild bibasilar atelectasis. No pleural effusion or pneumothorax. Degenerative change thoracic spine. IMPRESSION: 1. Prior CABG. Stable cardiomegaly. 2. Low lung volumes with mild bibasilar atelectasis. Electronically Signed   By: Marcello Moores  Register   On: 06/07/2020 05:21   US Abdomen Limited RUQ (LIVER/GB)  Result Date: 06/07/2020 CLINICAL DATA:  Right upper quadrant pain EXAM: ULTRASOUND ABDOMEN LIMITED RIGHT UPPER QUADRANT COMPARISON:  MRI/MRCP 03/29/2014 FINDINGS: Gallbladder: Tiny stones and sludge noted layering within the gallbladder. No gallbladder wall thickening, inflammation, or distension. Negative sonographic Murphy's sign. Common bile duct: Diameter: 8.9 mm.  No signs of choledocholithiasis. Liver: No focal lesion identified. Within normal limits in parenchymal echogenicity. Portal vein is patent on color Doppler imaging with normal direction of blood flow towards the liver. Other: None. IMPRESSION: 1. Tiny stones and sludge noted layering within the gallbladder. No secondary signs of acute cholecystitis. 2. Mild increase caliber of the common bile duct which measures up to 8.9 mm. No signs of choledocholithiasis. If there is a clinical concern for biliary obstruction and or choledocholithiasis consider further investigation with MRI/MRCP. Right upper quadrant pain please pick the correct US ABDOMEN LIMITED template. Electronically Signed   By: Kerby Moors M.D.   On: 06/07/2020 07:39   Anti-infectives (From admission, onward)   Start     Dose/Rate Route  Frequency Ordered Stop   06/08/20 0800  cefTRIAXone (ROCEPHIN) 2 g in sodium chloride 0.9 % 100 mL IVPB        2 g 200 mL/hr over 30 Minutes Intravenous Every 24 hours 06/07/20 1102     06/07/20 1430  doravirine (PIFELTRO) tablet 100 mg       Note to Pharmacy: OP GUY:QIHK 1 TABLET(100 MG) BY MOUTH DAILY with Tivicay Patient taking differently: Take 100 mg by mouth in the  morning (with Tivicay)     100 mg Oral Every morning 06/07/20 1102     06/07/20 1345  dolutegravir (TIVICAY) tablet 50 mg        50 mg Oral Every morning 06/07/20 1102     06/07/20 1200  metroNIDAZOLE (FLAGYL) IVPB 500 mg  Status:  Discontinued        500 mg 100 mL/hr over 60 Minutes Intravenous Every 8 hours 06/07/20 1102 06/07/20 2318   06/07/20 1115  valACYclovir (VALTREX) tablet 500 mg        500 mg Oral Every morning 06/07/20 1102     06/07/20 0715  piperacillin-tazobactam (ZOSYN) IVPB 3.375 g        3.375 g 100 mL/hr over 30 Minutes Intravenous  Once 06/07/20 0703 06/07/20 0831   06/07/20 0630  cefTRIAXone (ROCEPHIN) 1 g in sodium chloride 0.9 % 100 mL IVPB        1 g 200 mL/hr over 30 Minutes Intravenous  Once 06/07/20 0618 06/07/20 0705       Assessment/Plan HTN HLD HIV DM2 CHF (EF 40-45% on 05/28/20) CAD (hx of CABG, mult PCI with most recent angioplasty on 05/28/20 for in-stent restenosis) - Brilinta on hold. Tn elevated 37 > 308. No current CP. Defer to Cardiology. Patient will need cardiac clearance prior to surgery.   E. Coli Bacteremia  Choledocholithiasis  Patient currently planned for ERCP on Monday with GI after Brilinta has had time to wear off. We will plan for Lap Chole pending cardiac clearance to follow ERCP. I have explained the procedure, risks, and aftercare of cholecystectomy.  Risks include but are not limited to anesthesia (MI, CVA, Death), bleeding, infection, wound problems, diarrhea, bile leak, injury to common bile duct/liver/intestine. He seems to understand and agrees to proceed.  Will write for repeat CMP and Lipase for tomorrow to trend labs. We will see as needed over the weekend. Please call over the weekend for any questions or concerns.    FEN - CLD  VTE - SCDs ID - Rocephin for E. Coli Bacteremia   [facility] 06/08/2020, 12:06 PM Please see Amion for pager number during day hours 7:00am-4:30pm

## 2020-06-08 NOTE — Progress Notes (Signed)
  Echocardiogram 2D Echocardiogram has been performed.  [name] 06/08/2020, 3:28 PM

## 2020-06-08 NOTE — Progress Notes (Signed)
Progress Note  Patient Name: [name] Date of Encounter: 06/08/2020  [facility] Cardiologist: Kirk Ruths, MD   Subjective   Denies any chest pan or dyspnea  Inpatient Medications    Scheduled Meds: . acidophilus  1 capsule Oral Q breakfast  . aspirin EC  81 mg Oral Daily  . B-complex with vitamin C  1 tablet Oral Q breakfast  . cholecalciferol  2,000 Units Oral q AM  . dolutegravir  50 mg Oral q AM  . doravirine  100 mg Oral q AM  . insulin aspart  0-15 Units Subcutaneous TID WC  . isosorbide mononitrate  30 mg Oral Daily  . metoprolol tartrate  12.5 mg Oral BID  . pantoprazole  40 mg Oral QAC breakfast  . potassium chloride SA  20 mEq Oral Daily  . sodium bicarbonate  650 mg Oral TID  . tamsulosin  0.4 mg Oral Daily  . valACYclovir  500 mg Oral q AM   Continuous Infusions: . cefTRIAXone (ROCEPHIN)  IV 2 g (06/08/20 0849)  . lactated ringers 125 mL/hr at 06/08/20 0651   PRN Meds: loperamide, nitroGLYCERIN, ondansetron **OR** ondansetron (ZOFRAN) IV   Vital Signs    Vitals:   06/07/20 2300 06/07/20 2342 06/08/20 0309 06/08/20 0732  BP: (!) 123/55 (!) 121/59 (!) 134/53 (!) 153/62  Pulse: 63 72 61 63  Resp: 15 (!) 21 17 17   Temp:  98.2 F (36.8 C) 98.4 F (36.9 C) 97.9 F (36.6 C)  TempSrc:  Oral Oral Oral  SpO2:  98% 99% 96%  Weight:      Height:        Intake/Output Summary (Last 24 hours) at 06/08/2020 1517 Last data filed at 06/08/2020 0600 Gross per 24 hour  Intake 1721.63 ml  Output 1030 ml  Net 691.63 ml   Last 3 Weights 06/07/2020 06/05/2020 05/27/2020  Weight (lbs) 134 lb 14.7 oz 135 lb 1.6 oz 136 lb 14.5 oz  Weight (kg) 61.2 kg 61.281 kg 62.1 kg      Telemetry    NSR 60-70s - Personally Reviewed  ECG    No new ECG - Personally Reviewed  Physical Exam   GEN: No acute distress.   Neck: No JVD Cardiac: RRR, no murmurs, rubs, or gallops.  Respiratory: Clear to auscultation bilaterally. GI: Soft, nontender, non-distended    MS: No edema; No deformity. Neuro:  Nonfocal  Psych: Normal affect   Labs    High Sensitivity Troponin:   Recent Labs  Lab 05/27/20 1830 05/27/20 2042 05/28/20 0222 06/07/20 0436 06/07/20 0803  TROPONINIHS 154* 188* 1,001* 37* 308*      Chemistry Recent Labs  Lab 06/05/20 1101 06/07/20 0436 06/08/20 0312  NA 140 136 142  K 4.5 3.6 3.5  CL 105 102 113*  CO2 25 19* 21*  GLUCOSE 276* 239* 113*  BUN 30* 28* 22  CREATININE 2.10* 2.08* 1.81*  CALCIUM 9.2 8.5* 7.8*  PROT 6.6 7.5  --   ALBUMIN  --  3.7  --   AST 56* 133*  --   ALT 69* 162*  --   ALKPHOS  --  79  --   BILITOT 1.2 6.4*  --   GFRNONAA 31* 33* 39*  GFRAA 35*  --   --   ANIONGAP  --  15 8     Hematology Recent Labs  Lab 06/07/20 0436 06/08/20 0312  WBC 3.6* 5.8  RBC 4.19* 3.44*  HGB 13.6 10.8*  HCT 40.7  32.1*  MCV 97.1 93.3  MCH 32.5 31.4  MCHC 33.4 33.6  RDW 13.2 13.6  PLT 128* 121*    BNP Recent Labs  Lab 06/07/20 0436  BNP 162.2*     DDimer No results for input(s): DDIMER in the last 168 hours.   Radiology    CT ABDOMEN PELVIS WO CONTRAST  Result Date: 06/07/2020 CLINICAL DATA:  Left lower quadrant abdominal pain. EXAM: CT ABDOMEN AND PELVIS WITHOUT CONTRAST TECHNIQUE: Multidetector CT imaging of the abdomen and pelvis was performed following the standard protocol without IV contrast. COMPARISON:  March 22, 2014. FINDINGS: Lower chest: No acute abnormality. Hepatobiliary: Cholelithiasis is noted. There appears to be multiple stones in the distal common bile duct which may be resulting in mild common bile duct dilatation. No definite intrahepatic dilatation is noted. Pancreas: Unremarkable. No pancreatic ductal dilatation or surrounding inflammatory changes. Spleen: Normal in size without focal abnormality. Adrenals/Urinary Tract: Adrenal glands are unremarkable. Kidneys are normal, without renal calculi, focal lesion, or hydronephrosis. Bladder is unremarkable. Stomach/Bowel: Stomach is  within normal limits. Appendix appears normal. No evidence of bowel wall thickening, distention, or inflammatory changes. Vascular/Lymphatic: No significant vascular findings are present. No enlarged abdominal or pelvic lymph nodes. Reproductive: Mild prostatic enlargement is noted. Other: No abdominal wall hernia or abnormality. No abdominopelvic ascites. Musculoskeletal: No acute or significant osseous findings. IMPRESSION: 1. Cholelithiasis is noted. There appears to be multiple stones in the distal common bile duct which may be resulting in mild common bile duct dilatation. No definite intrahepatic dilatation is noted. Correlation with liver function tests is recommended to evaluate for distal common bile duct obstruction. 2. Mild prostatic enlargement. Electronically Signed   By: Marijo Conception M.D.   On: 06/07/2020 10:03   DG Chest 2 View  Result Date: 06/07/2020 CLINICAL DATA:  Epigastric pain. EXAM: CHEST - 2 VIEW COMPARISON:  05/27/2020. FINDINGS: Prior CABG. Stable cardiomegaly. Low lung volumes with mild bibasilar atelectasis. No pleural effusion or pneumothorax. Degenerative change thoracic spine. IMPRESSION: 1. Prior CABG. Stable cardiomegaly. 2. Low lung volumes with mild bibasilar atelectasis. Electronically Signed   By: Marcello Moores  Register   On: 06/07/2020 05:21   US Abdomen Limited RUQ (LIVER/GB)  Result Date: 06/07/2020 CLINICAL DATA:  Right upper quadrant pain EXAM: ULTRASOUND ABDOMEN LIMITED RIGHT UPPER QUADRANT COMPARISON:  MRI/MRCP 03/29/2014 FINDINGS: Gallbladder: Tiny stones and sludge noted layering within the gallbladder. No gallbladder wall thickening, inflammation, or distension. Negative sonographic Murphy's sign. Common bile duct: Diameter: 8.9 mm.  No signs of choledocholithiasis. Liver: No focal lesion identified. Within normal limits in parenchymal echogenicity. Portal vein is patent on color Doppler imaging with normal direction of blood flow towards the liver. Other: None.  IMPRESSION: 1. Tiny stones and sludge noted layering within the gallbladder. No secondary signs of acute cholecystitis. 2. Mild increase caliber of the common bile duct which measures up to 8.9 mm. No signs of choledocholithiasis. If there is a clinical concern for biliary obstruction and or choledocholithiasis consider further investigation with MRI/MRCP. Right upper quadrant pain please pick the correct US ABDOMEN LIMITED template. Electronically Signed   By: Kerby Moors M.D.   On: 06/07/2020 07:39    Cardiac Studies   Echo 10/25: 1. Left ventricular ejection fraction, by estimation, is 40 to 45%. The  left ventricle has mildly decreased function. The left ventricle  demonstrates regional wall motion abnormalities (see scoring  diagram/findings for description). There is mild left  ventricular hypertrophy. Left ventricular diastolic parameters are  consistent  with Grade I diastolic dysfunction (impaired relaxation).  2. Right ventricular systolic function is normal. The right ventricular  size is mildly enlarged. There is mildly elevated pulmonary artery  systolic pressure.  3. Left atrial size was mildly dilated.  4. The mitral valve is grossly normal. No evidence of mitral valve  regurgitation. Moderate mitral annular calcification.  5. The aortic valve is tricuspid. There is mild calcification of the  aortic valve. Aortic valve regurgitation is mild.  6. The inferior vena cava is dilated in size with <50% respiratory  variability, suggesting right atrial pressure of 15 mmHg.   LHC/RHC 10/25: Conclusions: 1. Severe native coronary artery disease, as detailed below. 2. Widely patent LIMA to diagonal/LAD. 3. Patent SVG to diagonal with 90% in-stent restenosis involving the distal anastomosis. 4. Patent SVG to ramus intermedius with chronic total occlusion of jump portion to OM1.  There is 30 to 40% in-stent restenosis at the ostium of the SVG to ramus intermedius. 5. Chronic  total occlusion of sequential SVG to RPDA and RPL. 6. Normal left ventricular filling pressure. 7. Successful PTCA to distal SVG to diagonal in-stent restenosis with 0% residual stenosis and TIMI-3 flow.  Recommendations: 1. Dual antiplatelet therapy with aspirin and ticagrelor for at least 12 months. 2. Aggressive secondary prevention. 3. Gentle post catheterization hydration given chronic kidney disease. 4. Remove right femoral artery sheath 2 hours after discontinuation of bivalirudin.   Patient Profile     74 y.o. male with a hx of HIV, CAD status post CABG with multiple PCI, most recent PTCA to distal SVG-for in-stent restenosis (10/25), diabetes, hyperlipidemia, COPD, mild aortic stenosis who is being seen today for the evaluation of preoperative evaluation at the request of Dr. Francine Graven.  Assessment & Plan     1. Preop evaluation with known CAD s/p CABG with recent PTCA SVG-diag (10/25): pt reports he was feeling quite well after discharge last week. No further chest pain, or shortness of breath. Has been compliant with home medication of ASA/Brilinta. Now presenting with acute Pancreatitis and sepsis from cholangitis. Has been seen by GI with plans for ERCP. Surgery then planning lap chole.  RCRI score 3 (CAD, CHF, intraperitoneal surgery), corresponding to 15% 30 day risk of death, MI, or cardiac arrest. Good functional capacity, >4 METS without any exertional chest pain or dyspnea. -Mild troponin elevation (37>308) on admission likely represents demand ischemia in setting of sepsis.  Will repeat echocardiogram to evaluate for new wall motion abnormality.  If unremarkable, no further work-up recommended prior to surgery - Continue ASA 81 mg daily - Given balloon angioplasty to SVG-diagonal on 10/25, but no stent was placed, OK to hold ticagrelor.  Restart as soon as able per surgery  2. Sepsis 2/2 to acute cholangitis: blood cultures growing E Coli, started on antibiotics, further  management per TRH and GI.  3. HLD: statin on hold with elevated LFTs  4. DM: SSI   5. HIV: continue HAART  For questions or updates, please contact [place] Please consult [email or web address] for contact info under        Signed, Donato Heinz, MD  06/08/2020, 3:17 PM

## 2020-06-08 NOTE — Progress Notes (Addendum)
Daily Rounding Note  06/08/2020, 9:30 AM  LOS: 1 day   SUBJECTIVE:   Chief complaint:   Abdominal pain, choledocholithisis, E coli bacteremia/? Cholangitis.  1 loose stool this AM, some pain in epigastric area not severe  OBJECTIVE:         Vital signs in last 24 hours:    Temp:  [97.9 F (36.6 C)-98.4 F (36.9 C)] 97.9 F (36.6 C) (11/05 0732) Pulse Rate:  [61-85] 63 (11/05 0732) Resp:  [13-21] 17 (11/05 0732) BP: (102-153)/(53-64) 153/62 (11/05 0732) SpO2:  [96 %-100 %] 96 % (11/05 0732) Weight:  [61.2 kg] 61.2 kg (11/04 1944) Last BM Date: 06/07/20 Filed Weights   06/07/20 1944  Weight: 61.2 kg   General: comfortable, looks well.  Slight icterus   Heart: RRR Chest: clear bil.  No cough of dyspnea.   Abdomen: soft, NT, ND.  Active BS  Extremities: no CCE Neuro/Psych:  Pleasant, alert, no confusion.  No deficits, weakness or tremors.  Fluid speech.  Good spirits, calm.    Intake/Output from previous day: 11/04 0701 - 11/05 0700 In: 3676.3 [I.V.:2526.3; IV Piggyback:1150] Out: 1030 [Urine:1030]  Intake/Output this shift: No intake/output data recorded.  Lab Results: Recent Labs    06/07/20 0436 06/08/20 0312  WBC 3.6* 5.8  HGB 13.6 10.8*  HCT 40.7 32.1*  PLT 128* 121*   BMET Recent Labs    06/05/20 1101 06/07/20 0436 06/08/20 0312  NA 140 136 142  K 4.5 3.6 3.5  CL 105 102 113*  CO2 25 19* 21*  GLUCOSE 276* 239* 113*  BUN 30* 28* 22  CREATININE 2.10* 2.08* 1.81*  CALCIUM 9.2 8.5* 7.8*   LFT Recent Labs    06/05/20 1101 06/07/20 0436  PROT 6.6 7.5  ALBUMIN  --  3.7  AST 56* 133*  ALT 69* 162*  ALKPHOS  --  79  BILITOT 1.2 6.4*   PT/INR Recent Labs    06/07/20 0552 06/08/20 0312  LABPROT 16.2* 16.1*  INR 1.4* 1.3*   Hepatitis Panel Recent Labs    06/07/20 0803  HEPBSAG NON REACTIVE  HCVAB NON REACTIVE  HEPAIGM NON REACTIVE  HEPBIGM NON REACTIVE     Studies/Results: CT ABDOMEN PELVIS WO CONTRAST  Result Date: 06/07/2020 CLINICAL DATA:  Left lower quadrant abdominal pain. EXAM: CT ABDOMEN AND PELVIS WITHOUT CONTRAST TECHNIQUE: Multidetector CT imaging of the abdomen and pelvis was performed following the standard protocol without IV contrast. COMPARISON:  March 22, 2014. FINDINGS: Lower chest: No acute abnormality. Hepatobiliary: Cholelithiasis is noted. There appears to be multiple stones in the distal common bile duct which may be resulting in mild common bile duct dilatation. No definite intrahepatic dilatation is noted. Pancreas: Unremarkable. No pancreatic ductal dilatation or surrounding inflammatory changes. Spleen: Normal in size without focal abnormality. Adrenals/Urinary Tract: Adrenal glands are unremarkable. Kidneys are normal, without renal calculi, focal lesion, or hydronephrosis. Bladder is unremarkable. Stomach/Bowel: Stomach is within normal limits. Appendix appears normal. No evidence of bowel wall thickening, distention, or inflammatory changes. Vascular/Lymphatic: No significant vascular findings are present. No enlarged abdominal or pelvic lymph nodes. Reproductive: Mild prostatic enlargement is noted. Other: No abdominal wall hernia or abnormality. No abdominopelvic ascites. Musculoskeletal: No acute or significant osseous findings. IMPRESSION: 1. Cholelithiasis is noted. There appears to be multiple stones in the distal common bile duct which may be resulting in mild common bile duct dilatation. No definite intrahepatic dilatation is noted. Correlation with liver function  tests is recommended to evaluate for distal common bile duct obstruction. 2. Mild prostatic enlargement. Electronically Signed   By: Marijo Conception M.D.   On: 06/07/2020 10:03   DG Chest 2 View  Result Date: 06/07/2020 CLINICAL DATA:  Epigastric pain. EXAM: CHEST - 2 VIEW COMPARISON:  05/27/2020. FINDINGS: Prior CABG. Stable cardiomegaly. Low lung volumes  with mild bibasilar atelectasis. No pleural effusion or pneumothorax. Degenerative change thoracic spine. IMPRESSION: 1. Prior CABG. Stable cardiomegaly. 2. Low lung volumes with mild bibasilar atelectasis. Electronically Signed   By: Marcello Moores  Register   On: 06/07/2020 05:21   US Abdomen Limited RUQ (LIVER/GB)  Result Date: 06/07/2020 CLINICAL DATA:  Right upper quadrant pain EXAM: ULTRASOUND ABDOMEN LIMITED RIGHT UPPER QUADRANT COMPARISON:  MRI/MRCP 03/29/2014 FINDINGS: Gallbladder: Tiny stones and sludge noted layering within the gallbladder. No gallbladder wall thickening, inflammation, or distension. Negative sonographic Murphy's sign. Common bile duct: Diameter: 8.9 mm.  No signs of choledocholithiasis. Liver: No focal lesion identified. Within normal limits in parenchymal echogenicity. Portal vein is patent on color Doppler imaging with normal direction of blood flow towards the liver. Other: None. IMPRESSION: 1. Tiny stones and sludge noted layering within the gallbladder. No secondary signs of acute cholecystitis. 2. Mild increase caliber of the common bile duct which measures up to 8.9 mm. No signs of choledocholithiasis. If there is a clinical concern for biliary obstruction and or choledocholithiasis consider further investigation with MRI/MRCP. Right upper quadrant pain please pick the correct US ABDOMEN LIMITED template. Electronically Signed   By: Kerby Moors M.D.   On: 06/07/2020 07:39    Scheduled Meds: . acidophilus  1 capsule Oral Q breakfast  . aspirin EC  81 mg Oral Daily  . B-complex with vitamin C  1 tablet Oral Q breakfast  . cholecalciferol  2,000 Units Oral q AM  . dolutegravir  50 mg Oral q AM  . doravirine  100 mg Oral q AM  . insulin aspart  0-15 Units Subcutaneous TID WC  . isosorbide mononitrate  30 mg Oral Daily  . metoprolol tartrate  12.5 mg Oral BID  . pantoprazole  40 mg Oral QAC breakfast  . potassium chloride SA  20 mEq Oral Daily  . sodium bicarbonate  650  mg Oral TID  . tamsulosin  0.4 mg Oral Daily  . valACYclovir  500 mg Oral q AM   Continuous Infusions: . cefTRIAXone (ROCEPHIN)  IV 2 g (06/08/20 0849)  . lactated ringers 125 mL/hr at 06/08/20 0651   PRN Meds:.nitroGLYCERIN, ondansetron **OR** ondansetron (ZOFRAN) IV   ASSESMENT:   *   Choledocholithiasis ERCP set for Monday 11/8 after 5 d Plavix washout  *   Cholelithiasis w/o cholecystitis.  At some point needs GB removed.  Spoke w surgical PA-C Maxwell Caul, they will see pt  *   E coli bacteremia.  Continues on Rocephin day 2.  Fever to 103.2 early AM 11/4, WBCs never elevated.    *   CAD w remote CABG and multiple subsequent NSTEMIs, Stents/PCIs.  Non-STEMI and PCI to in-stent restenosis 10/25. Was not on Brilinta coming into that admission though had used it in past, discharged home on Brilinta, last dose 11/3. Trop I: 37 >> 308.  Was as high as 1000 on 05/28/20.   *   HIV.  Compliant w all meds.    *   DM2.    *   CKD 3.    *   Normocytic anemia  *  Thrombocytopenia.    *   Adenomatous colon polyps, hemorrhoids 2013, no polyps on repeat colon 11/2019.     PLAN   *   ERCP set for 8 AM on Monday 11/8, NPO after midnight Monday for this.  Continue Rocephin.    *   Will recheck LFTs tmrw AM.    *   Cardiology can shed light on the recurrent elevation of Trop I  *   At some point needs GB removed.  Spoke w surgical PA-C Maxwell Caul, they will see pt   Azucena Freed  06/08/2020, 9:30 AM Phone [phone]    Attending physician's note   I have taken an interval history, reviewed the chart and examined the patient. I agree with the Advanced Practitioner's note, impression and recommendations.   Choledocholithiasis with obs jaundice.  E. coli bacteremia on Rocephin s/o mild ascending cholangitis.  Pancreatitis has resolved.  Patient doing very well.  CAD s/p CABG with multiple PCI, most recent 10/25 when he presented with NSTEMI. On Brilinta (last dose 11/3).    Increasing troponin (37-->308) ?Etiology.  Cardiology is aware.  HIV    Plan: -Trend CBC, CMP and lipase. -IV Rocephin -ERCP with ES/stone extraction Monday 11/8 at 8 AM with Dr. Henrene Pastor after Brilinta washout x 5 days, if OK with cardiology.  Have discussed risks and benefits.  Orders in depo.   Carmell Austria, MD [facility] [phone]

## 2020-06-08 NOTE — Progress Notes (Signed)
Cardiology consulted for antiplatelet medication management given recent coronary intervention and now presenting with acute biliary pancreatitis with plans for ERCP.  Given balloon angioplasty to SVG-diagonal on 10/25, but no stent was placed, OK to hold ticagrelor for procedure.  D/w interventional cardiology.  Would continue ASA 81 mg daily and restart ticagrelor as soon as able after procedure.  [facility] will sign off.   Medication Recommendations:  ASA 81 mg daily, restart ticagrelor as soon as OK from GI perspective after procedure.  Otherwise continue home meds Other recommendations (labs, testing, etc):  None Follow up as an outpatient:  Scheduled for 07/05/20 with Dr Stanford Breed

## 2020-06-08 NOTE — Progress Notes (Signed)
PROGRESS NOTE    [name]  CLE:[id number] DOB: May 29, 1946 DOA: 06/07/2020 PCP: Dorothyann Peng, NP   Chief Complain: Abdominal pain  Brief Narrative:   Patient is a 74 year old male with history of coronary disease status post recent left heart cath with successful PTCA currently taking aspirin Brilinta, diabetes type 2, status 3B CKD, hypertension, HIV, chronic diastolic congestive heart failure, BPH who presents to the emergency department with complaints of abdominal pain mainly in the epigastric region and right upper quadrant.  In the emergency department, he also had fever and was tachycardic.  Patient was found to have elevated liver enzymes on presentation, elevated bilirubin, elevated lactate, elevated troponin.  CT abdomen/pelvis showed cholelithiasis, multiple stones in the distal CBD resulting in mild CBD dilation.  Procalcitonin was elevated.  Patient was admitted for the management of possible sepsis secondary to possible cholangitis.  GI ,general surgery ,cardiology following.  Plan for ERCP on Monday and also cholecystectomy by general surgery.  Assessment & Plan:   Principal Problem:   Sepsis ([place]) Active Problems:   Human immunodeficiency virus (HIV) disease (HCC)   Depression, recurrent ([place])   Chronic obstructive pulmonary disease ([place])   HIV disease ([place])   Chronic kidney disease, stage 3b ([place])   Coronary artery disease   Type 2 diabetes mellitus with stage 3b chronic kidney disease, without long-term current use of insulin ([place])   Benign prostatic hyperplasia without lower urinary tract symptoms   Chronic diastolic CHF (congestive heart failure) (HCC)   Calculus of bile duct with acute cholangitis   Pre-op evaluation   Severe sepsis secondary to possible acute cholangitis: Presented with fever, tachycardia, lactic acidosis, AKI, elevated white cell counts.  Elevated procalcitonin. continue current antibiotics, follow-up cultures.  Continue gentle IV fluids.   Sepsis physiology has resolved.  Gram-negative bacteremia: Blood cultures now showing E. coli.  Will follow final blood culture report.  Continue current antibiotics  Diarrhea: Reported 4-5 episodes of diarrhea today.  Will check GI pathogen panel.  Continue Imodium as needed.  Denies any abdominal pain  Choledocholithiasis/suspected cholangitis: Presented with abdominal pain mainly in the epigastric and right upper quadrant.  CT finding as above.  Right upper quadrant ultrasound showed tiny stones and sludge noted layering within the gallbladder,no secondary signs of acute cholecystitis,mild increase caliber of the common bile duct which measures up,no signs of choledocholithiasis.  Elevated liver enzymes and bilirubin.  Since he presented with sepsis-like picture along with above findings, cholangitis suspected.  GI following.  Plan for ERCP on Monday.  Follow-up CMP, lipase tomorrow. General surgery also planning for cholecystectomy.  AKI on CKD stage IIIb: Presented with creatinine in the range of 2.  Baseline creatinine ranges from 1.5-1.8.  Kidney function improved with IV fluids.  Coronary artery disease status post CABG: History of severe coronary artery disease.  Underwent PTCA on October this year and currently on aspirin and Brilinta.  Noted elevated troponin.  Present denies any chest pain.  Elevated troponin could be from supply demand ischemia from sepsis.  Continue nitrates, beta-blockers.  Statin on hold due to elevated liver enzymes. Cardiology following and recommended to restart Brilinta when there is no further plan for operative intervention.  Diastolic congestive heart failure: lasix on hold.  On gentle IV fluids due to concern for sepsis, dehydration and AKI.  Continue current medications.  HIV: Continue HIV therapy           DVT prophylaxis:SCD Code Status: Full Family Communication: None at bedside Status is:  Inpatient  Remains inpatient appropriate  because:Inpatient level of care appropriate due to severity of illness   Dispo: The patient is from: Home              Anticipated d/c is to: Home              Anticipated d/c date is: 3 days              Patient currently is not medically stable to d/c. Plan for ERCP on Monday followed by cholecystectomy by general surgery     Consultants: GI,cardiology  Procedures:None  Antimicrobials:  Anti-infectives (From admission, onward)   Start     Dose/Rate Route Frequency Ordered Stop   06/08/20 0800  cefTRIAXone (ROCEPHIN) 2 g in sodium chloride 0.9 % 100 mL IVPB        2 g 200 mL/hr over 30 Minutes Intravenous Every 24 hours 06/07/20 1102     06/07/20 1430  doravirine (PIFELTRO) tablet 100 mg       Note to Pharmacy: OP XYI:AXKP 1 TABLET(100 MG) BY MOUTH DAILY with Tivicay Patient taking differently: Take 100 mg by mouth in the morning (with Tivicay)     100 mg Oral Every morning 06/07/20 1102     06/07/20 1345  dolutegravir (TIVICAY) tablet 50 mg        50 mg Oral Every morning 06/07/20 1102     06/07/20 1200  metroNIDAZOLE (FLAGYL) IVPB 500 mg  Status:  Discontinued        500 mg 100 mL/hr over 60 Minutes Intravenous Every 8 hours 06/07/20 1102 06/07/20 2318   06/07/20 1115  valACYclovir (VALTREX) tablet 500 mg        500 mg Oral Every morning 06/07/20 1102     06/07/20 0715  piperacillin-tazobactam (ZOSYN) IVPB 3.375 g        3.375 g 100 mL/hr over 30 Minutes Intravenous  Once 06/07/20 0703 06/07/20 0831   06/07/20 0630  cefTRIAXone (ROCEPHIN) 1 g in sodium chloride 0.9 % 100 mL IVPB        1 g 200 mL/hr over 30 Minutes Intravenous  Once 06/07/20 0618 06/07/20 0705      Subjective: Patient seen and examined at the bedside this morning.  Hemodynamically stable during my evaluation.  He had 4-5 episodes of loose stool this morning.  Denies severe abdominal pain, nausea or vomiting.  Complains of some abdominal discomfort.  Afebrile.  He looks comfortable during my  evaluation.   Objective: Vitals:   06/07/20 2300 06/07/20 2342 06/08/20 0309 06/08/20 0732  BP: (!) 123/55 (!) 121/59 (!) 134/53 (!) 153/62  Pulse: 63 72 61 63  Resp: 15 (!) 21 17 17   Temp:  98.2 F (36.8 C) 98.4 F (36.9 C) 97.9 F (36.6 C)  TempSrc:  Oral Oral Oral  SpO2:  98% 99% 96%  Weight:      Height:        Intake/Output Summary (Last 24 hours) at 06/08/2020 0827 Last data filed at 06/08/2020 0600 Gross per 24 hour  Intake 2676.28 ml  Output 1030 ml  Net 1646.28 ml   Filed Weights   06/07/20 1944  Weight: 61.2 kg    Examination:  General exam: Appears calm and comfortable ,Not in distress,average built HEENT:PERRL,Oral mucosa moist, Ear/Nose normal on gross exam Respiratory system: Bilateral equal air entry, normal vesicular breath sounds, no wheezes or crackles  Cardiovascular system: S1 & S2 heard, RRR. No JVD, murmurs, rubs, gallops or clicks. No  pedal edema. Gastrointestinal system: Abdomen is mildly distended, soft and nontender. No organomegaly or masses felt. Normal bowel sounds heard. Central nervous system: Alert and oriented. No focal neurological deficits. Extremities: No edema, no clubbing ,no cyanosis Skin: No rashes, lesions or ulcers,no icterus ,no pallor   Data Reviewed: I have personally reviewed following labs and imaging studies  CBC: Recent Labs  Lab 06/07/20 0436 06/08/20 0312  WBC 3.6* 5.8  NEUTROABS 3.4  --   HGB 13.6 10.8*  HCT 40.7 32.1*  MCV 97.1 93.3  PLT 128* 998*   Basic Metabolic Panel: Recent Labs  Lab 06/05/20 1101 06/07/20 0436 06/08/20 0312  NA 140 136 142  K 4.5 3.6 3.5  CL 105 102 113*  CO2 25 19* 21*  GLUCOSE 276* 239* 113*  BUN 30* 28* 22  CREATININE 2.10* 2.08* 1.81*  CALCIUM 9.2 8.5* 7.8*   GFR: Estimated Creatinine Clearance: 29.7 mL/min (A) (by C-G formula based on SCr of 1.81 mg/dL (H)). Liver Function Tests: Recent Labs  Lab 06/05/20 1101 06/07/20 0436  AST 56* 133*  ALT 69* 162*   ALKPHOS  --  79  BILITOT 1.2 6.4*  PROT 6.6 7.5  ALBUMIN  --  3.7   Recent Labs  Lab 06/07/20 0436  LIPASE 66*   No results for input(s): AMMONIA in the last 168 hours. Coagulation Profile: Recent Labs  Lab 06/07/20 0552 06/08/20 0312  INR 1.4* 1.3*   Cardiac Enzymes: No results for input(s): CKTOTAL, CKMB, CKMBINDEX, TROPONINI in the last 168 hours. BNP (last 3 results) No results for input(s): PROBNP in the last 8760 hours. HbA1C: No results for input(s): HGBA1C in the last 72 hours. CBG: Recent Labs  Lab 06/07/20 1153 06/07/20 1605 06/07/20 2110 06/08/20 0728  GLUCAP 150* 178* 141* 131*   Lipid Profile: No results for input(s): CHOL, HDL, LDLCALC, TRIG, CHOLHDL, LDLDIRECT in the last 72 hours. Thyroid Function Tests: No results for input(s): TSH, T4TOTAL, FREET4, T3FREE, THYROIDAB in the last 72 hours. Anemia Panel: No results for input(s): VITAMINB12, FOLATE, FERRITIN, TIBC, IRON, RETICCTPCT in the last 72 hours. Sepsis Labs: Recent Labs  Lab 06/07/20 0552 06/07/20 0803 06/08/20 0312  PROCALCITON  --   --  60.40  LATICACIDVEN 1.5 1.0  --     Recent Results (from the past 240 hour(s))  Respiratory Panel by RT PCR (Flu A&B, Covid) - Nasopharyngeal Swab     Status: None   Collection Time: 06/07/20  5:51 AM   Specimen: Nasopharyngeal Swab  Result Value Ref Range Status   SARS Coronavirus 2 by RT PCR NEGATIVE NEGATIVE Final    Comment: (NOTE) SARS-CoV-2 target nucleic acids are NOT DETECTED.  The SARS-CoV-2 RNA is generally detectable in upper respiratoy specimens during the acute phase of infection. The lowest concentration of SARS-CoV-2 viral copies this assay can detect is 131 copies/mL. A negative result does not preclude SARS-Cov-2 infection and should not be used as the sole basis for treatment or other patient management decisions. A negative result may occur with  improper specimen collection/handling, submission of specimen other than  nasopharyngeal swab, presence of viral mutation(s) within the areas targeted by this assay, and inadequate number of viral copies (<131 copies/mL). A negative result must be combined with clinical observations, patient history, and epidemiological information. The expected result is Negative.  Fact Sheet for Patients:  PinkCheek.be  Fact Sheet for Healthcare Providers:  GravelBags.it  This test is no t yet approved or cleared by the [place] and  has been authorized for detection and/or diagnosis of SARS-CoV-2 by FDA under an Emergency Use Authorization (EUA). This EUA will remain  in effect (meaning this test can be used) for the duration of the COVID-19 declaration under Section 564(b)(1) of the Act, 21 U.S.C. section 360bbb-3(b)(1), unless the authorization is terminated or revoked sooner.     Influenza A by PCR NEGATIVE NEGATIVE Final   Influenza B by PCR NEGATIVE NEGATIVE Final    Comment: (NOTE) The Xpert Xpress SARS-CoV-2/FLU/RSV assay is intended as an aid in  the diagnosis of influenza from Nasopharyngeal swab specimens and  should not be used as a sole basis for treatment. Nasal washings and  aspirates are unacceptable for Xpert Xpress SARS-CoV-2/FLU/RSV  testing.  Fact Sheet for Patients: PinkCheek.be  Fact Sheet for Healthcare Providers: GravelBags.it  This test is not yet approved or cleared by the [place] FDA and  has been authorized for detection and/or diagnosis of SARS-CoV-2 by  FDA under an Emergency Use Authorization (EUA). This EUA will remain  in effect (meaning this test can be used) for the duration of the  Covid-19 declaration under Section 564(b)(1) of the Act, 21  U.S.C. section 360bbb-3(b)(1), unless the authorization is  terminated or revoked. Performed at [facility], [place] [address] [place],  [place] [address]   Blood culture (routine single)     Status: Abnormal (Preliminary result)   Collection Time: 06/07/20  5:52 AM   Specimen: BLOOD  Result Value Ref Range Status   Specimen Description BLOOD SITE NOT SPECIFIED  Final   Special Requests   Final    BOTTLES DRAWN AEROBIC AND ANAEROBIC Blood Culture adequate volume   Culture  Setup Time   Final    GRAM NEGATIVE RODS IN BOTH AEROBIC AND ANAEROBIC BOTTLES CRITICAL RESULT CALLED TO, READ BACK BY AND VERIFIED WITH: PHARMD G ABBOTT 06/07/20 AT 2313 SK    Culture (A)  Final    ESCHERICHIA COLI SUSCEPTIBILITIES TO FOLLOW Performed at [facility], [place] [address], [place], [place] [address]    Report Status PENDING  Incomplete  Blood Culture ID Panel (Reflexed)     Status: Abnormal   Collection Time: 06/07/20  5:52 AM  Result Value Ref Range Status   Enterococcus faecalis NOT DETECTED NOT DETECTED Final   Enterococcus Faecium NOT DETECTED NOT DETECTED Final   Listeria monocytogenes NOT DETECTED NOT DETECTED Final   Staphylococcus species NOT DETECTED NOT DETECTED Final   Staphylococcus aureus (BCID) NOT DETECTED NOT DETECTED Final   Staphylococcus epidermidis NOT DETECTED NOT DETECTED Final   Staphylococcus lugdunensis NOT DETECTED NOT DETECTED Final   Streptococcus species NOT DETECTED NOT DETECTED Final   Streptococcus agalactiae NOT DETECTED NOT DETECTED Final   Streptococcus pneumoniae NOT DETECTED NOT DETECTED Final   Streptococcus pyogenes NOT DETECTED NOT DETECTED Final   A.calcoaceticus-baumannii NOT DETECTED NOT DETECTED Final   Bacteroides fragilis NOT DETECTED NOT DETECTED Final   Enterobacterales DETECTED (A) NOT DETECTED Final    Comment: Enterobacterales represent a large order of gram negative bacteria, not a single organism. CRITICAL RESULT CALLED TO, READ BACK BY AND VERIFIED WITH: PHARMD G ABBOTT 06/07/20 AT 2313 SK    Enterobacter cloacae complex NOT DETECTED NOT DETECTED Final   Escherichia coli  DETECTED (A) NOT DETECTED Final    Comment: CRITICAL RESULT CALLED TO, READ BACK BY AND VERIFIED WITH: PHARMD G ABBOTT 06/07/20 AT 2313 SK    Klebsiella aerogenes NOT DETECTED NOT DETECTED Final  Klebsiella oxytoca NOT DETECTED NOT DETECTED Final   Klebsiella pneumoniae NOT DETECTED NOT DETECTED Final   Proteus species NOT DETECTED NOT DETECTED Final   Salmonella species NOT DETECTED NOT DETECTED Final   Serratia marcescens NOT DETECTED NOT DETECTED Final   Haemophilus influenzae NOT DETECTED NOT DETECTED Final   Neisseria meningitidis NOT DETECTED NOT DETECTED Final   Pseudomonas aeruginosa NOT DETECTED NOT DETECTED Final   Stenotrophomonas maltophilia NOT DETECTED NOT DETECTED Final   Candida albicans NOT DETECTED NOT DETECTED Final   Candida auris NOT DETECTED NOT DETECTED Final   Candida glabrata NOT DETECTED NOT DETECTED Final   Candida krusei NOT DETECTED NOT DETECTED Final   Candida parapsilosis NOT DETECTED NOT DETECTED Final   Candida tropicalis NOT DETECTED NOT DETECTED Final   Cryptococcus neoformans/gattii NOT DETECTED NOT DETECTED Final   CTX-M ESBL NOT DETECTED NOT DETECTED Final   Carbapenem resistance IMP NOT DETECTED NOT DETECTED Final   Carbapenem resistance KPC NOT DETECTED NOT DETECTED Final   Carbapenem resistance NDM NOT DETECTED NOT DETECTED Final   Carbapenem resist OXA 48 LIKE NOT DETECTED NOT DETECTED Final   Carbapenem resistance VIM NOT DETECTED NOT DETECTED Final    Comment: Performed at [facility], [address] [place], [place] [address]         Radiology Studies: CT ABDOMEN PELVIS WO CONTRAST  Result Date: 06/07/2020 CLINICAL DATA:  Left lower quadrant abdominal pain. EXAM: CT ABDOMEN AND PELVIS WITHOUT CONTRAST TECHNIQUE: Multidetector CT imaging of the abdomen and pelvis was performed following the standard protocol without IV contrast. COMPARISON:  March 22, 2014. FINDINGS: Lower chest: No acute abnormality. Hepatobiliary:  Cholelithiasis is noted. There appears to be multiple stones in the distal common bile duct which may be resulting in mild common bile duct dilatation. No definite intrahepatic dilatation is noted. Pancreas: Unremarkable. No pancreatic ductal dilatation or surrounding inflammatory changes. Spleen: Normal in size without focal abnormality. Adrenals/Urinary Tract: Adrenal glands are unremarkable. Kidneys are normal, without renal calculi, focal lesion, or hydronephrosis. Bladder is unremarkable. Stomach/Bowel: Stomach is within normal limits. Appendix appears normal. No evidence of bowel wall thickening, distention, or inflammatory changes. Vascular/Lymphatic: No significant vascular findings are present. No enlarged abdominal or pelvic lymph nodes. Reproductive: Mild prostatic enlargement is noted. Other: No abdominal wall hernia or abnormality. No abdominopelvic ascites. Musculoskeletal: No acute or significant osseous findings. IMPRESSION: 1. Cholelithiasis is noted. There appears to be multiple stones in the distal common bile duct which may be resulting in mild common bile duct dilatation. No definite intrahepatic dilatation is noted. Correlation with liver function tests is recommended to evaluate for distal common bile duct obstruction. 2. Mild prostatic enlargement. Electronically Signed   By: Marijo Conception M.D.   On: 06/07/2020 10:03   DG Chest 2 View  Result Date: 06/07/2020 CLINICAL DATA:  Epigastric pain. EXAM: CHEST - 2 VIEW COMPARISON:  05/27/2020. FINDINGS: Prior CABG. Stable cardiomegaly. Low lung volumes with mild bibasilar atelectasis. No pleural effusion or pneumothorax. Degenerative change thoracic spine. IMPRESSION: 1. Prior CABG. Stable cardiomegaly. 2. Low lung volumes with mild bibasilar atelectasis. Electronically Signed   By: Marcello Moores  Register   On: 06/07/2020 05:21   US Abdomen Limited RUQ (LIVER/GB)  Result Date: 06/07/2020 CLINICAL DATA:  Right upper quadrant pain EXAM: ULTRASOUND  ABDOMEN LIMITED RIGHT UPPER QUADRANT COMPARISON:  MRI/MRCP 03/29/2014 FINDINGS: Gallbladder: Tiny stones and sludge noted layering within the gallbladder. No gallbladder wall thickening, inflammation, or distension. Negative sonographic Murphy's sign. Common bile  duct: Diameter: 8.9 mm.  No signs of choledocholithiasis. Liver: No focal lesion identified. Within normal limits in parenchymal echogenicity. Portal vein is patent on color Doppler imaging with normal direction of blood flow towards the liver. Other: None. IMPRESSION: 1. Tiny stones and sludge noted layering within the gallbladder. No secondary signs of acute cholecystitis. 2. Mild increase caliber of the common bile duct which measures up to 8.9 mm. No signs of choledocholithiasis. If there is a clinical concern for biliary obstruction and or choledocholithiasis consider further investigation with MRI/MRCP. Right upper quadrant pain please pick the correct US ABDOMEN LIMITED template. Electronically Signed   By: Kerby Moors M.D.   On: 06/07/2020 07:39        Scheduled Meds: . acidophilus  1 capsule Oral Q breakfast  . aspirin EC  81 mg Oral Daily  . B-complex with vitamin C  1 tablet Oral Q breakfast  . cholecalciferol  2,000 Units Oral q AM  . dolutegravir  50 mg Oral q AM  . doravirine  100 mg Oral q AM  . insulin aspart  0-15 Units Subcutaneous TID WC  . isosorbide mononitrate  30 mg Oral Daily  . metoprolol tartrate  12.5 mg Oral BID  . pantoprazole  40 mg Oral QAC breakfast  . potassium chloride SA  20 mEq Oral Daily  . sodium bicarbonate  650 mg Oral TID  . tamsulosin  0.4 mg Oral Daily  . valACYclovir  500 mg Oral q AM   Continuous Infusions: . sodium chloride 1,000 mL (06/07/20 0612)  . cefTRIAXone (ROCEPHIN)  IV    . lactated ringers 125 mL/hr at 06/08/20 0651     LOS: 1 day    Time spent: 35 mins.More than 50% of that time was spent in counseling and/or coordination of care.      Shelly Coss, MD [facility] P11/12/2019, 8:27 AM

## 2020-06-09 ENCOUNTER — Other Ambulatory Visit: Payer: Self-pay | Admitting: Endocrinology

## 2020-06-09 DIAGNOSIS — R7401 Elevation of levels of liver transaminase levels: Secondary | ICD-10-CM

## 2020-06-09 DIAGNOSIS — R778 Other specified abnormalities of plasma proteins: Secondary | ICD-10-CM

## 2020-06-09 DIAGNOSIS — I2510 Atherosclerotic heart disease of native coronary artery without angina pectoris: Secondary | ICD-10-CM | POA: Diagnosis not present

## 2020-06-09 LAB — GASTROINTESTINAL PANEL BY PCR, STOOL (REPLACES STOOL CULTURE)

## 2020-06-09 LAB — COMPREHENSIVE METABOLIC PANEL
ALT: 97 U/L — ABNORMAL HIGH (ref 0–44)
AST: 82 U/L — ABNORMAL HIGH (ref 15–41)
Albumin: 2.6 g/dL — ABNORMAL LOW (ref 3.5–5.0)
Alkaline Phosphatase: 67 U/L (ref 38–126)
Anion gap: 12 (ref 5–15)
BUN: 18 mg/dL (ref 8–23)
CO2: 15 mmol/L — ABNORMAL LOW (ref 22–32)
Calcium: 8.4 mg/dL — ABNORMAL LOW (ref 8.9–10.3)
Chloride: 111 mmol/L (ref 98–111)
Creatinine, Ser: 1.59 mg/dL — ABNORMAL HIGH (ref 0.61–1.24)
GFR, Estimated: 46 mL/min — ABNORMAL LOW (ref >60)
Glucose, Bld: 206 mg/dL — ABNORMAL HIGH (ref 70–99)
Potassium: 4 mmol/L (ref 3.5–5.1)
Sodium: 138 mmol/L (ref 135–145)
Total Bilirubin: 3.5 mg/dL — ABNORMAL HIGH (ref 0.3–1.2)
Total Protein: 5.9 g/dL — ABNORMAL LOW (ref 6.5–8.1)

## 2020-06-09 LAB — CBC WITH DIFFERENTIAL/PLATELET
Abs Immature Granulocytes: 0.03 10*3/uL (ref 0.00–0.07)
Basophils Absolute: 0 10*3/uL (ref 0.0–0.1)
Basophils Relative: 0 %
Eosinophils Absolute: 0.1 10*3/uL (ref 0.0–0.5)
Eosinophils Relative: 1 %
HCT: 35 % — ABNORMAL LOW (ref 39.0–52.0)
Hemoglobin: 11.8 g/dL — ABNORMAL LOW (ref 13.0–17.0)
Immature Granulocytes: 1 %
Lymphocytes Relative: 6 %
Lymphs Abs: 0.4 10*3/uL — ABNORMAL LOW (ref 0.7–4.0)
MCH: 31.3 pg (ref 26.0–34.0)
MCHC: 33.7 g/dL (ref 30.0–36.0)
MCV: 92.8 fL (ref 80.0–100.0)
Monocytes Absolute: 0.6 10*3/uL (ref 0.1–1.0)
Monocytes Relative: 9 %
Neutro Abs: 5.3 10*3/uL (ref 1.7–7.7)
Neutrophils Relative %: 83 %
Platelets: 144 10*3/uL — ABNORMAL LOW (ref 150–400)
RBC: 3.77 MIL/uL — ABNORMAL LOW (ref 4.22–5.81)
RDW: 13.5 % (ref 11.5–15.5)
WBC: 6.4 10*3/uL (ref 4.0–10.5)
nRBC: 0 % (ref 0.0–0.2)

## 2020-06-09 LAB — CULTURE, BLOOD (SINGLE): Special Requests: ADEQUATE

## 2020-06-09 LAB — TROPONIN I (HIGH SENSITIVITY): Troponin I (High Sensitivity): 1816 ng/L (ref <18)

## 2020-06-09 LAB — GLUCOSE, CAPILLARY
Glucose-Capillary: 172 mg/dL — ABNORMAL HIGH (ref 70–99)
Glucose-Capillary: 197 mg/dL — ABNORMAL HIGH (ref 70–99)
Glucose-Capillary: 116 mg/dL — ABNORMAL HIGH (ref 70–99)

## 2020-06-09 LAB — LIPASE, BLOOD: Lipase: 88 U/L — ABNORMAL HIGH (ref 11–51)

## 2020-06-09 MED ORDER — LABETALOL HCL 5 MG/ML IV SOLN
10.0000 mg | INTRAVENOUS | Status: DC | PRN
  Administered 2020-06-09 – 2020-06-16 (×5): 10 mg via INTRAVENOUS
  Filled 2020-06-09 (×4): qty 4

## 2020-06-09 NOTE — Progress Notes (Signed)
PROGRESS NOTE    JAYANT KRIZ  OEU:[id number] DOB: June 18, 1946 DOA: 06/07/2020 PCP: Dorothyann Peng, NP   Chief Complain: Abdominal pain  Brief Narrative:   Patient is a 74 year old male with history of coronary disease status post recent left heart cath with successful PTCA currently taking aspirin Brilinta, diabetes type 2, status 3B CKD, hypertension, HIV, chronic diastolic congestive heart failure, BPH who presents to the emergency department with complaints of abdominal pain mainly in the epigastric region and right upper quadrant.  In the emergency department, he also had fever and was tachycardic.  Patient was found to have elevated liver enzymes on presentation, elevated bilirubin, elevated lactate, elevated troponin.  CT abdomen/pelvis showed cholelithiasis, multiple stones in the distal CBD resulting in mild CBD dilation.  Procalcitonin was elevated.  Patient was admitted for the management of possible sepsis secondary to possible cholangitis.  GI ,general surgery ,cardiology following.  Plan for ERCP on Monday and also cholecystectomy by general surgery.  Assessment & Plan:   Principal Problem:   Sepsis ([place]) Active Problems:   Human immunodeficiency virus (HIV) disease (HCC)   Depression, recurrent ([place])   Chronic obstructive pulmonary disease ([place])   HIV disease ([place])   Chronic kidney disease, stage 3b ([place])   Coronary artery disease   Type 2 diabetes mellitus with stage 3b chronic kidney disease, without long-term current use of insulin ([place])   Benign prostatic hyperplasia without lower urinary tract symptoms   Chronic diastolic CHF (congestive heart failure) (HCC)   Calculus of bile duct with acute cholangitis   Pre-op evaluation   Severe sepsis secondary to possible acute cholangitis: Presented with fever, tachycardia, lactic acidosis, AKI, elevated white cell counts.  Elevated procalcitonin. continue current antibiotics, follow-up cultures.  Sepsis physiology has  resolved.  Gram-negative bacteremia: Blood cultures now showing E. coli.  Will follow final blood culture report.  Continue current antibiotics  Diarrhea: Reported 4-5 episodes of diarrhea but has improved now.    Continue Imodium as needed.  Denies any abdominal pain  Choledocholithiasis/suspected cholangitis: Presented with abdominal pain mainly in the epigastric and right upper quadrant.  CT finding as above.  Right upper quadrant ultrasound showed tiny stones and sludge noted layering within the gallbladder,no secondary signs of acute cholecystitis,mild increase caliber of the common bile duct which measures up,no signs of choledocholithiasis.  Elevated liver enzymes and bilirubin.  Since he presented with sepsis-like picture along with above findings, cholangitis suspected.  GI following.  Plan for ERCP on Monday.  Improving liver enzymes, lipase level. General surgery also planning for cholecystectomy.  AKI on CKD stage IIIb: Presented with creatinine in the range of 2.  Baseline creatinine ranges from 1.5-1.8.  Kidney function improved with IV fluids.  Coronary artery disease status post CABG: History of severe coronary artery disease.  Underwent PTCA on October this year and currently on aspirin and Brilinta.  Noted elevated troponin.  Present denies any chest pain.  Elevated troponin could be from supply demand ischemia from sepsis.  Continue nitrates, beta-blockers.  Statin on hold due to elevated liver enzymes. Cardiology following and recommended to restart Brilinta when there is no further plan for operative intervention.  Diastolic congestive heart failure: lasix on hold.  On gentle IV fluids due to concern for sepsis, dehydration and AKI.  Continue current medications.Echo done on this admission showed ejection fraction of 60 to 65%, no wall motion abnormality.  HIV: Continue HIV therapy  Hypertension: Hypertensive since yesterday.  Continue current regimen.  Monitor blood  pressure             DVT prophylaxis:SCD Code Status: Full Family Communication: None at bedside Status is: Inpatient  Remains inpatient appropriate because:Inpatient level of care appropriate due to severity of illness   Dispo: The patient is from: Home              Anticipated d/c is to: Home              Anticipated d/c date is: 3 days              Patient currently is not medically stable to d/c. Plan for ERCP on Monday followed by cholecystectomy by general surgery     Consultants: GI,cardiology  Procedures:None  Antimicrobials:  Anti-infectives (From admission, onward)   Start     Dose/Rate Route Frequency Ordered Stop   06/08/20 0800  cefTRIAXone (ROCEPHIN) 2 g in sodium chloride 0.9 % 100 mL IVPB        2 g 200 mL/hr over 30 Minutes Intravenous Every 24 hours 06/07/20 1102     06/07/20 1430  doravirine (PIFELTRO) tablet 100 mg       Note to Pharmacy: OP TIW:PYKD 1 TABLET(100 MG) BY MOUTH DAILY with Tivicay Patient taking differently: Take 100 mg by mouth in the morning (with Tivicay)     100 mg Oral Every morning 06/07/20 1102     06/07/20 1345  dolutegravir (TIVICAY) tablet 50 mg        50 mg Oral Every morning 06/07/20 1102     06/07/20 1200  metroNIDAZOLE (FLAGYL) IVPB 500 mg  Status:  Discontinued        500 mg 100 mL/hr over 60 Minutes Intravenous Every 8 hours 06/07/20 1102 06/07/20 2318   06/07/20 1115  valACYclovir (VALTREX) tablet 500 mg        500 mg Oral Every morning 06/07/20 1102     06/07/20 0715  piperacillin-tazobactam (ZOSYN) IVPB 3.375 g        3.375 g 100 mL/hr over 30 Minutes Intravenous  Once 06/07/20 0703 06/07/20 0831   06/07/20 0630  cefTRIAXone (ROCEPHIN) 1 g in sodium chloride 0.9 % 100 mL IVPB        1 g 200 mL/hr over 30 Minutes Intravenous  Once 06/07/20 0618 06/07/20 0705      Subjective: Patient seen and examined at the bedside this morning.  Hemodynamically stable.  Denies any abdominal pain, nausea or vomiting.   Comfortable   Objective: Vitals:   06/09/20 0445 06/09/20 0615 06/09/20 0740 06/09/20 0800  BP: (!) 178/81 (!) 185/85 (!) 177/79 (!) 166/71  Pulse: 82 71 87 94  Resp: 18 18 13 20   Temp:      TempSrc:      SpO2: 95% 96% 95% 95%  Weight:      Height:        Intake/Output Summary (Last 24 hours) at 06/09/2020 9833 Last data filed at 06/09/2020 8250 Gross per 24 hour  Intake 720 ml  Output 2950 ml  Net -2230 ml   Filed Weights   06/07/20 1944  Weight: 61.2 kg    Examination:  General exam: Appears calm and comfortable ,Not in distress,average built HEENT:PERRL,Oral mucosa moist, Ear/Nose normal on gross exam Respiratory system: Bilateral equal air entry, normal vesicular breath sounds, no wheezes or crackles  Cardiovascular system: S1 & S2 heard, RRR. No JVD, murmurs, rubs, gallops or clicks. No pedal edema. Gastrointestinal system: Abdomen is mildly distended, soft and nontender. No  organomegaly or masses felt. Normal bowel sounds heard. Central nervous system: Alert and oriented. No focal neurological deficits. Extremities: No edema, no clubbing ,no cyanosis Skin: No rashes, lesions or ulcers,no icterus ,no pallor   Data Reviewed: I have personally reviewed following labs and imaging studies  CBC: Recent Labs  Lab 06/07/20 0436 06/08/20 0312 06/09/20 0456  WBC 3.6* 5.8 6.4  NEUTROABS 3.4  --  5.3  HGB 13.6 10.8* 11.8*  HCT 40.7 32.1* 35.0*  MCV 97.1 93.3 92.8  PLT 128* 121* 355*   Basic Metabolic Panel: Recent Labs  Lab 06/05/20 1101 06/07/20 0436 06/08/20 0312 06/09/20 0456  NA 140 136 142 138  K 4.5 3.6 3.5 4.0  CL 105 102 113* 111  CO2 25 19* 21* 15*  GLUCOSE 276* 239* 113* 206*  BUN 30* 28* 22 18  CREATININE 2.10* 2.08* 1.81* 1.59*  CALCIUM 9.2 8.5* 7.8* 8.4*   GFR: Estimated Creatinine Clearance: 33.8 mL/min (A) (by C-G formula based on SCr of 1.59 mg/dL (H)). Liver Function Tests: Recent Labs  Lab 06/05/20 1101 06/07/20 0436  06/09/20 0456  AST 56* 133* 82*  ALT 69* 162* 97*  ALKPHOS  --  79 67  BILITOT 1.2 6.4* 3.5*  PROT 6.6 7.5 5.9*  ALBUMIN  --  3.7 2.6*   Recent Labs  Lab 06/07/20 0436 06/09/20 0456  LIPASE 66* 88*   No results for input(s): AMMONIA in the last 168 hours. Coagulation Profile: Recent Labs  Lab 06/07/20 0552 06/08/20 0312  INR 1.4* 1.3*   Cardiac Enzymes: No results for input(s): CKTOTAL, CKMB, CKMBINDEX, TROPONINI in the last 168 hours. BNP (last 3 results) No results for input(s): PROBNP in the last 8760 hours. HbA1C: No results for input(s): HGBA1C in the last 72 hours. CBG: Recent Labs  Lab 06/08/20 0728 06/08/20 1201 06/08/20 1608 06/08/20 2117 06/09/20 0742  GLUCAP 131* 143* 130* 196* 172*   Lipid Profile: No results for input(s): CHOL, HDL, LDLCALC, TRIG, CHOLHDL, LDLDIRECT in the last 72 hours. Thyroid Function Tests: No results for input(s): TSH, T4TOTAL, FREET4, T3FREE, THYROIDAB in the last 72 hours. Anemia Panel: No results for input(s): VITAMINB12, FOLATE, FERRITIN, TIBC, IRON, RETICCTPCT in the last 72 hours. Sepsis Labs: Recent Labs  Lab 06/07/20 0552 06/07/20 0803 06/08/20 0312  PROCALCITON  --   --  60.40  LATICACIDVEN 1.5 1.0  --     Recent Results (from the past 240 hour(s))  Respiratory Panel by RT PCR (Flu A&B, Covid) - Nasopharyngeal Swab     Status: None   Collection Time: 06/07/20  5:51 AM   Specimen: Nasopharyngeal Swab  Result Value Ref Range Status   SARS Coronavirus 2 by RT PCR NEGATIVE NEGATIVE Final    Comment: (NOTE) SARS-CoV-2 target nucleic acids are NOT DETECTED.  The SARS-CoV-2 RNA is generally detectable in upper respiratoy specimens during the acute phase of infection. The lowest concentration of SARS-CoV-2 viral copies this assay can detect is 131 copies/mL. A negative result does not preclude SARS-Cov-2 infection and should not be used as the sole basis for treatment or other patient management decisions. A  negative result may occur with  improper specimen collection/handling, submission of specimen other than nasopharyngeal swab, presence of viral mutation(s) within the areas targeted by this assay, and inadequate number of viral copies (<131 copies/mL). A negative result must be combined with clinical observations, patient history, and epidemiological information. The expected result is Negative.  Fact Sheet for Patients:  PinkCheek.be  Fact Sheet for  Healthcare Providers:  GravelBags.it  This test is no t yet approved or cleared by the [place] and  has been authorized for detection and/or diagnosis of SARS-CoV-2 by FDA under an Emergency Use Authorization (EUA). This EUA will remain  in effect (meaning this test can be used) for the duration of the COVID-19 declaration under Section 564(b)(1) of the Act, 21 U.S.C. section 360bbb-3(b)(1), unless the authorization is terminated or revoked sooner.     Influenza A by PCR NEGATIVE NEGATIVE Final   Influenza B by PCR NEGATIVE NEGATIVE Final    Comment: (NOTE) The Xpert Xpress SARS-CoV-2/FLU/RSV assay is intended as an aid in  the diagnosis of influenza from Nasopharyngeal swab specimens and  should not be used as a sole basis for treatment. Nasal washings and  aspirates are unacceptable for Xpert Xpress SARS-CoV-2/FLU/RSV  testing.  Fact Sheet for Patients: PinkCheek.be  Fact Sheet for Healthcare Providers: GravelBags.it  This test is not yet approved or cleared by the [place] FDA and  has been authorized for detection and/or diagnosis of SARS-CoV-2 by  FDA under an Emergency Use Authorization (EUA). This EUA will remain  in effect (meaning this test can be used) for the duration of the  Covid-19 declaration under Section 564(b)(1) of the Act, 21  U.S.C. section 360bbb-3(b)(1), unless the authorization  is  terminated or revoked. Performed at [facility], [place] [address] [place], [place] [address]   Blood culture (routine single)     Status: Abnormal   Collection Time: 06/07/20  5:52 AM   Specimen: BLOOD  Result Value Ref Range Status   Specimen Description BLOOD SITE NOT SPECIFIED  Final   Special Requests   Final    BOTTLES DRAWN AEROBIC AND ANAEROBIC Blood Culture adequate volume   Culture  Setup Time   Final    GRAM NEGATIVE RODS IN BOTH AEROBIC AND ANAEROBIC BOTTLES CRITICAL RESULT CALLED TO, READ BACK BY AND VERIFIED WITH: PHARMD G ABBOTT 06/07/20 AT 2313 SK Performed at [facility], [place] [address] [place], [place] [address]    Culture ESCHERICHIA COLI (A)  Final   Report Status 06/09/2020 FINAL  Final   Organism ID, Bacteria ESCHERICHIA COLI  Final      Susceptibility   Escherichia coli - MIC*    AMPICILLIN <=2 SENSITIVE Sensitive     CEFAZOLIN <=4 SENSITIVE Sensitive     CEFEPIME <=0.12 SENSITIVE Sensitive     CEFTAZIDIME <=1 SENSITIVE Sensitive     CEFTRIAXONE <=0.25 SENSITIVE Sensitive     CIPROFLOXACIN <=0.25 SENSITIVE Sensitive     GENTAMICIN <=1 SENSITIVE Sensitive     IMIPENEM <=0.25 SENSITIVE Sensitive     TRIMETH/SULFA <=20 SENSITIVE Sensitive     AMPICILLIN/SULBACTAM <=2 SENSITIVE Sensitive     PIP/TAZO <=4 SENSITIVE Sensitive     * ESCHERICHIA COLI  Blood Culture ID Panel (Reflexed)     Status: Abnormal   Collection Time: 06/07/20  5:52 AM  Result Value Ref Range Status   Enterococcus faecalis NOT DETECTED NOT DETECTED Final   Enterococcus Faecium NOT DETECTED NOT DETECTED Final   Listeria monocytogenes NOT DETECTED NOT DETECTED Final   Staphylococcus species NOT DETECTED NOT DETECTED Final   Staphylococcus aureus (BCID) NOT DETECTED NOT DETECTED Final   Staphylococcus epidermidis NOT DETECTED NOT DETECTED Final   Staphylococcus lugdunensis NOT DETECTED NOT DETECTED Final   Streptococcus species NOT DETECTED NOT DETECTED Final    Streptococcus agalactiae NOT DETECTED NOT DETECTED Final  Streptococcus pneumoniae NOT DETECTED NOT DETECTED Final   Streptococcus pyogenes NOT DETECTED NOT DETECTED Final   A.calcoaceticus-baumannii NOT DETECTED NOT DETECTED Final   Bacteroides fragilis NOT DETECTED NOT DETECTED Final   Enterobacterales DETECTED (A) NOT DETECTED Final    Comment: Enterobacterales represent a large order of gram negative bacteria, not a single organism. CRITICAL RESULT CALLED TO, READ BACK BY AND VERIFIED WITH: PHARMD G ABBOTT 06/07/20 AT 2313 SK    Enterobacter cloacae complex NOT DETECTED NOT DETECTED Final   Escherichia coli DETECTED (A) NOT DETECTED Final    Comment: CRITICAL RESULT CALLED TO, READ BACK BY AND VERIFIED WITH: PHARMD G ABBOTT 06/07/20 AT 2313 SK    Klebsiella aerogenes NOT DETECTED NOT DETECTED Final   Klebsiella oxytoca NOT DETECTED NOT DETECTED Final   Klebsiella pneumoniae NOT DETECTED NOT DETECTED Final   Proteus species NOT DETECTED NOT DETECTED Final   Salmonella species NOT DETECTED NOT DETECTED Final   Serratia marcescens NOT DETECTED NOT DETECTED Final   Haemophilus influenzae NOT DETECTED NOT DETECTED Final   Neisseria meningitidis NOT DETECTED NOT DETECTED Final   Pseudomonas aeruginosa NOT DETECTED NOT DETECTED Final   Stenotrophomonas maltophilia NOT DETECTED NOT DETECTED Final   Candida albicans NOT DETECTED NOT DETECTED Final   Candida auris NOT DETECTED NOT DETECTED Final   Candida glabrata NOT DETECTED NOT DETECTED Final   Candida krusei NOT DETECTED NOT DETECTED Final   Candida parapsilosis NOT DETECTED NOT DETECTED Final   Candida tropicalis NOT DETECTED NOT DETECTED Final   Cryptococcus neoformans/gattii NOT DETECTED NOT DETECTED Final   CTX-M ESBL NOT DETECTED NOT DETECTED Final   Carbapenem resistance IMP NOT DETECTED NOT DETECTED Final   Carbapenem resistance KPC NOT DETECTED NOT DETECTED Final   Carbapenem resistance NDM NOT DETECTED NOT DETECTED Final    Carbapenem resist OXA 48 LIKE NOT DETECTED NOT DETECTED Final   Carbapenem resistance VIM NOT DETECTED NOT DETECTED Final    Comment: Performed at [facility], [address] [place], [place] [address]         Radiology Studies: CT ABDOMEN PELVIS WO CONTRAST  Result Date: 06/07/2020 CLINICAL DATA:  Left lower quadrant abdominal pain. EXAM: CT ABDOMEN AND PELVIS WITHOUT CONTRAST TECHNIQUE: Multidetector CT imaging of the abdomen and pelvis was performed following the standard protocol without IV contrast. COMPARISON:  March 22, 2014. FINDINGS: Lower chest: No acute abnormality. Hepatobiliary: Cholelithiasis is noted. There appears to be multiple stones in the distal common bile duct which may be resulting in mild common bile duct dilatation. No definite intrahepatic dilatation is noted. Pancreas: Unremarkable. No pancreatic ductal dilatation or surrounding inflammatory changes. Spleen: Normal in size without focal abnormality. Adrenals/Urinary Tract: Adrenal glands are unremarkable. Kidneys are normal, without renal calculi, focal lesion, or hydronephrosis. Bladder is unremarkable. Stomach/Bowel: Stomach is within normal limits. Appendix appears normal. No evidence of bowel wall thickening, distention, or inflammatory changes. Vascular/Lymphatic: No significant vascular findings are present. No enlarged abdominal or pelvic lymph nodes. Reproductive: Mild prostatic enlargement is noted. Other: No abdominal wall hernia or abnormality. No abdominopelvic ascites. Musculoskeletal: No acute or significant osseous findings. IMPRESSION: 1. Cholelithiasis is noted. There appears to be multiple stones in the distal common bile duct which may be resulting in mild common bile duct dilatation. No definite intrahepatic dilatation is noted. Correlation with liver function tests is recommended to evaluate for distal common bile duct obstruction. 2. Mild prostatic enlargement. Electronically Signed   By: Bobbe Medico.D.  On: 06/07/2020 10:03   ECHOCARDIOGRAM LIMITED  Result Date: 06/08/2020    ECHOCARDIOGRAM LIMITED REPORT   Patient Name:   ZAKKERY DORIAN Garro Date of Exam: 06/08/2020 Medical Rec #:  [id number]    Height:       63.0 in Accession #:    [id number]   Weight:       134.9 lb Date of Birth:  1945-09-13   BSA:          1.636 m Patient Age:    38 years     BP:           153/62 mmHg Patient Gender: M            HR:           69 bpm. Exam Location:  Inpatient Procedure: Limited Echo, Limited Color Doppler and Cardiac Doppler Indications:    cad of native vessel 414.01  History:        Patient has prior history of Echocardiogram examinations, most                 recent 05/28/2020. Prior CABG, chronic kidney disease. sepsis.                 HIV; Risk Factors:Hypertension, Dyslipidemia and Diabetes.  Sonographer:    Johny Chess Referring Phys: [id number] [place]  1. Compared to echo report from 05/28/20, LVEF is improved. . Left ventricular ejection fraction, by estimation, is 60 to 65%. The left ventricle has normal function. The left ventricle has no regional wall motion abnormalities.  2. Right ventricular systolic function is normal. The right ventricular size is normal. There is mildly elevated pulmonary artery systolic pressure.  3. Mild mitral valve regurgitation.  4. TR jet is eccentric, directed anterior in RA.. Tricuspid valve regurgitation is mild to moderate.  5. AV is thickened, calcified with restricted motion . Peak and mean gradients through the valve are 25 and 15 mm Hg respectively This is increasred from echo report of 10/25.. The aortic valve is abnormal. Aortic valve regurgitation is mild.  6. The inferior vena cava is dilated in size with <50% respiratory variability, suggesting right atrial pressure of 15 mmHg. FINDINGS  Left Ventricle: Compared to echo report from 05/28/20, LVEF is improved. Left ventricular ejection fraction, by estimation, is 60 to 65%. The  left ventricle has normal function. The left ventricle has no regional wall motion abnormalities. The left ventricular internal cavity size was normal in size. There is no left ventricular hypertrophy. Right Ventricle: The right ventricular size is normal. Right ventricular systolic function is normal. There is mildly elevated pulmonary artery systolic pressure. The tricuspid regurgitant velocity is 2.95 m/s, and with an assumed right atrial pressure of 5 mmHg, the estimated right ventricular systolic pressure is 85.6 mmHg. Left Atrium: Left atrial size was normal in size. Right Atrium: Right atrial size was normal in size. Pericardium: There is no evidence of pericardial effusion. Mitral Valve: The mitral valve is abnormal. There is mild thickening of the mitral valve leaflet(s). Mild to moderate mitral annular calcification. Mild mitral valve regurgitation. Tricuspid Valve: TR jet is eccentric, directed anterior in RA. The tricuspid valve is normal in structure. Tricuspid valve regurgitation is mild to moderate. Aortic Valve: AV is thickened, calcified with restricted motion . Peak and mean gradients through the valve are 25 and 15 mm Hg respectively This is increasred from echo report of 10/25. The aortic valve is abnormal. Aortic valve regurgitation is mild. Aortic  regurgitation PHT measures 327 msec. Aortic valve mean gradient measures 15.0 mmHg. Aortic valve peak gradient measures 25.4 mmHg. Pulmonic Valve: The pulmonic valve was normal in structure. Pulmonic valve regurgitation is not visualized. Aorta: The aortic root and ascending aorta are structurally normal, with no evidence of dilitation. Venous: The inferior vena cava is dilated in size with less than 50% respiratory variability, suggesting right atrial pressure of 15 mmHg. LEFT VENTRICLE PLAX 2D LVIDd:         5.00 cm Diastology LVIDs:         3.60 cm LV e' medial:    5.22 cm/s LV PW:         0.70 cm LV E/e' medial:  22.2 LV IVS:        0.90 cm LV e'  lateral:   10.70 cm/s                        LV E/e' lateral: 10.8  IVC IVC diam: 2.20 cm LEFT ATRIUM         Index LA diam:    4.20 cm 2.57 cm/m  AORTIC VALVE AV Vmax:           252.00 cm/s AV Vmean:          185.000 cm/s AV VTI:            0.571 m AV Peak Grad:      25.4 mmHg AV Mean Grad:      15.0 mmHg LVOT Vmax:         93.30 cm/s LVOT Vmean:        65.800 cm/s LVOT VTI:          0.210 m LVOT/AV VTI ratio: 0.37 AI PHT:            327 msec  AORTA Ao Asc diam: 3.50 cm MITRAL VALVE                TRICUSPID VALVE MV Area (PHT): 3.72 cm     TR Peak grad:   34.8 mmHg MV Decel Time: 204 msec     TR Vmax:        295.00 cm/s MV E velocity: 116.00 cm/s MV A velocity: 96.40 cm/s   SHUNTS MV E/A ratio:  1.20         Systemic VTI: 0.21 m Dorris Carnes MD Electronically signed by Dorris Carnes MD Signature Date/Time: 06/08/2020/6:54:02 PM    Final         Scheduled Meds: . acidophilus  1 capsule Oral Q breakfast  . aspirin EC  81 mg Oral Daily  . B-complex with vitamin C  1 tablet Oral Q breakfast  . cholecalciferol  2,000 Units Oral q AM  . dolutegravir  50 mg Oral q AM  . doravirine  100 mg Oral q AM  . insulin aspart  0-15 Units Subcutaneous TID WC  . isosorbide mononitrate  30 mg Oral Daily  . metoprolol tartrate  12.5 mg Oral BID  . pantoprazole  40 mg Oral QAC breakfast  . potassium chloride SA  20 mEq Oral Daily  . sodium bicarbonate  650 mg Oral TID  . tamsulosin  0.4 mg Oral Daily  . valACYclovir  500 mg Oral q AM   Continuous Infusions: . cefTRIAXone (ROCEPHIN)  IV 2 g (06/08/20 0849)  . lactated ringers 75 mL/hr at 06/09/20 0635     LOS: 2 days    Time spent: 35 mins.More than  50% of that time was spent in counseling and/or coordination of care.      Shelly Coss, MD [facility] P11/01/2020, 8:33 AM

## 2020-06-09 NOTE — Plan of Care (Signed)

## 2020-06-09 NOTE — Progress Notes (Addendum)
Progress Note  Patient Name: [name] Date of Encounter: 06/09/2020  [facility] Cardiologist: Kirk Ruths, MD   Subjective   No complaints  Inpatient Medications    Scheduled Meds: . acidophilus  1 capsule Oral Q breakfast  . aspirin EC  81 mg Oral Daily  . B-complex with vitamin C  1 tablet Oral Q breakfast  . cholecalciferol  2,000 Units Oral q AM  . dolutegravir  50 mg Oral q AM  . doravirine  100 mg Oral q AM  . insulin aspart  0-15 Units Subcutaneous TID WC  . isosorbide mononitrate  30 mg Oral Daily  . metoprolol tartrate  12.5 mg Oral BID  . pantoprazole  40 mg Oral QAC breakfast  . potassium chloride SA  20 mEq Oral Daily  . sodium bicarbonate  650 mg Oral TID  . tamsulosin  0.4 mg Oral Daily  . valACYclovir  500 mg Oral q AM   Continuous Infusions: . cefTRIAXone (ROCEPHIN)  IV 2 g (06/09/20 0908)   PRN Meds: labetalol, loperamide, nitroGLYCERIN, ondansetron **OR** ondansetron (ZOFRAN) IV   Vital Signs    Vitals:   06/09/20 0740 06/09/20 0800 06/09/20 1000 06/09/20 1200  BP: (!) 177/79 (!) 166/71 (!) 170/71 (!) 153/74  Pulse: 87 94 91 81  Resp: 13 20 20 19   Temp:      TempSrc:      SpO2: 95% 95% 95% 95%  Weight:      Height:        Intake/Output Summary (Last 24 hours) at 06/09/2020 1218 Last data filed at 06/09/2020 1050 Gross per 24 hour  Intake 2063.92 ml  Output 3600 ml  Net -1536.08 ml   Last 3 Weights 06/07/2020 06/05/2020 05/27/2020  Weight (lbs) 134 lb 14.7 oz 135 lb 1.6 oz 136 lb 14.5 oz  Weight (kg) 61.2 kg 61.281 kg 62.1 kg      Telemetry    NSR - Personally Reviewed  ECG    n/a- Personally Reviewed  Physical Exam   GEN: No acute distress.   Neck: No JVD Cardiac: RRR, no murmurs, rubs, or gallops.  Respiratory: Clear to auscultation bilaterally. GI: Soft, nontender, non-distended  MS: No edema; No deformity. Neuro:  Nonfocal  Psych: Normal affect   Labs    High Sensitivity Troponin:   Recent Labs  Lab  05/27/20 2042 05/28/20 0222 06/07/20 0436 06/07/20 0803 06/08/20 1534  TROPONINIHS 188* 1,001* 37* 308* 3,561*      Chemistry Recent Labs  Lab 06/05/20 1101 06/05/20 1101 06/07/20 0436 06/08/20 0312 06/09/20 0456  NA 140   < > 136 142 138  K 4.5   < > 3.6 3.5 4.0  CL 105   < > 102 113* 111  CO2 25   < > 19* 21* 15*  GLUCOSE 276*   < > 239* 113* 206*  BUN 30*   < > 28* 22 18  CREATININE 2.10*  --  2.08* 1.81* 1.59*  CALCIUM 9.2   < > 8.5* 7.8* 8.4*  PROT 6.6  --  7.5  --  5.9*  ALBUMIN  --   --  3.7  --  2.6*  AST 56*  --  133*  --  82*  ALT 69*  --  162*  --  97*  ALKPHOS  --   --  79  --  67  BILITOT 1.2  --  6.4*  --  3.5*  GFRNONAA 31*  --  33* 39* 46*  GFRAA 35*  --   --   --   --   ANIONGAP  --   --  15 8 12    < > = values in this interval not displayed.     Hematology Recent Labs  Lab 06/07/20 0436 06/08/20 0312 06/09/20 0456  WBC 3.6* 5.8 6.4  RBC 4.19* 3.44* 3.77*  HGB 13.6 10.8* 11.8*  HCT 40.7 32.1* 35.0*  MCV 97.1 93.3 92.8  MCH 32.5 31.4 31.3  MCHC 33.4 33.6 33.7  RDW 13.2 13.6 13.5  PLT 128* 121* 144*    BNP Recent Labs  Lab 06/07/20 0436  BNP 162.2*     DDimer No results for input(s): DDIMER in the last 168 hours.   Radiology    ECHOCARDIOGRAM LIMITED  Result Date: 06/08/2020    ECHOCARDIOGRAM LIMITED REPORT   Patient Name:   MCCADE SULLENBERGER Picker Date of Exam: 06/08/2020 Medical Rec #:  [id number]    Height:       63.0 in Accession #:    [id number]   Weight:       134.9 lb Date of Birth:  23-Sep-1945   BSA:          1.636 m Patient Age:    74 years     BP:           153/62 mmHg Patient Gender: M            HR:           69 bpm. Exam Location:  Inpatient Procedure: Limited Echo, Limited Color Doppler and Cardiac Doppler Indications:    cad of native vessel 414.01  History:        Patient has prior history of Echocardiogram examinations, most                 recent 05/28/2020. Prior CABG, chronic kidney disease. sepsis.                 HIV; Risk  Factors:Hypertension, Dyslipidemia and Diabetes.  Sonographer:    Johny Chess Referring Phys: [id number] [place]  1. Compared to echo report from 05/28/20, LVEF is improved. . Left ventricular ejection fraction, by estimation, is 60 to 65%. The left ventricle has normal function. The left ventricle has no regional wall motion abnormalities.  2. Right ventricular systolic function is normal. The right ventricular size is normal. There is mildly elevated pulmonary artery systolic pressure.  3. Mild mitral valve regurgitation.  4. TR jet is eccentric, directed anterior in RA.. Tricuspid valve regurgitation is mild to moderate.  5. AV is thickened, calcified with restricted motion . Peak and mean gradients through the valve are 25 and 15 mm Hg respectively This is increasred from echo report of 10/25.. The aortic valve is abnormal. Aortic valve regurgitation is mild.  6. The inferior vena cava is dilated in size with <50% respiratory variability, suggesting right atrial pressure of 15 mmHg. FINDINGS  Left Ventricle: Compared to echo report from 05/28/20, LVEF is improved. Left ventricular ejection fraction, by estimation, is 60 to 65%. The left ventricle has normal function. The left ventricle has no regional wall motion abnormalities. The left ventricular internal cavity size was normal in size. There is no left ventricular hypertrophy. Right Ventricle: The right ventricular size is normal. Right ventricular systolic function is normal. There is mildly elevated pulmonary artery systolic pressure. The tricuspid regurgitant velocity is 2.95 m/s, and with an assumed right atrial pressure of 5 mmHg, the estimated right  ventricular systolic pressure is 19.5 mmHg. Left Atrium: Left atrial size was normal in size. Right Atrium: Right atrial size was normal in size. Pericardium: There is no evidence of pericardial effusion. Mitral Valve: The mitral valve is abnormal. There is mild thickening of  the mitral valve leaflet(s). Mild to moderate mitral annular calcification. Mild mitral valve regurgitation. Tricuspid Valve: TR jet is eccentric, directed anterior in RA. The tricuspid valve is normal in structure. Tricuspid valve regurgitation is mild to moderate. Aortic Valve: AV is thickened, calcified with restricted motion . Peak and mean gradients through the valve are 25 and 15 mm Hg respectively This is increasred from echo report of 10/25. The aortic valve is abnormal. Aortic valve regurgitation is mild. Aortic regurgitation PHT measures 327 msec. Aortic valve mean gradient measures 15.0 mmHg. Aortic valve peak gradient measures 25.4 mmHg. Pulmonic Valve: The pulmonic valve was normal in structure. Pulmonic valve regurgitation is not visualized. Aorta: The aortic root and ascending aorta are structurally normal, with no evidence of dilitation. Venous: The inferior vena cava is dilated in size with less than 50% respiratory variability, suggesting right atrial pressure of 15 mmHg. LEFT VENTRICLE PLAX 2D LVIDd:         5.00 cm Diastology LVIDs:         3.60 cm LV e' medial:    5.22 cm/s LV PW:         0.70 cm LV E/e' medial:  22.2 LV IVS:        0.90 cm LV e' lateral:   10.70 cm/s                        LV E/e' lateral: 10.8  IVC IVC diam: 2.20 cm LEFT ATRIUM         Index LA diam:    4.20 cm 2.57 cm/m  AORTIC VALVE AV Vmax:           252.00 cm/s AV Vmean:          185.000 cm/s AV VTI:            0.571 m AV Peak Grad:      25.4 mmHg AV Mean Grad:      15.0 mmHg LVOT Vmax:         93.30 cm/s LVOT Vmean:        65.800 cm/s LVOT VTI:          0.210 m LVOT/AV VTI ratio: 0.37 AI PHT:            327 msec  AORTA Ao Asc diam: 3.50 cm MITRAL VALVE                TRICUSPID VALVE MV Area (PHT): 3.72 cm     TR Peak grad:   34.8 mmHg MV Decel Time: 204 msec     TR Vmax:        295.00 cm/s MV E velocity: 116.00 cm/s MV A velocity: 96.40 cm/s   SHUNTS MV E/A ratio:  1.20         Systemic VTI: 0.21 m Dorris Carnes MD  Electronically signed by Dorris Carnes MD Signature Date/Time: 06/08/2020/6:54:02 PM    Final     Cardiac Studies     Patient Profile     74 y.o. male with a hx of HIV, CAD status post CABG with multiple PCI,most recent PTCA to distal SVG-for in-stent restenosis(10/25),diabetes, hyperlipidemia, COPD, mild aortic stenosiswho is being seen today for the evaluation  of preoperative evaluationat the request of Dr. Francine Graven.  Assessment & Plan    1. CAD - history of prior CABG, multiple PCIs - most recently 10/25/21cath: patent LIMA to LAD, patent SVG-diag 90% ISR, patent SVG-ramus with CTO of jump portion to OM1. CTO SVG-RPDA/RPL. Had PTCA to distal SVG to diag - 05/28/20 echo LVEF 40-45%, inferior/inferolateral hypokinesis  - admitted 11/4 with sepsis, presumed cholangitis with epigastric and RUQ pain - at troponin was checked and found to be elevated. Now up to 3561. EKG inferior Qwaves, LVH with chronic strain pattern.  - repeat echo shows that LVEF has normalized, prior inferior/inferolateral WMA has resolved  - suspect demand ischemia in setting of chronic obstructive CAD combined with severe sepsis  - medical therapy with ASA 81, imdur 30, lopressor 12.5mg  bid, statin on hold due to elevated LFTs.  - f/u repeat troponins and EKG. WIth normalized LVEF and no cardiopulmonary I think an acute event is unlikely.   2. Choledocholithiasis with obstructive jaundice - plan is for ERCP with stone extraction after brillinta wash out - possible lap chole to follow   3. Ecoli bacteremia/Severe sepsis - Presented with fever, tachycardia, lactic acidosis, AKI, elevated white cell counts.  Elevated procalcitonin.  - abx per primary team  For questions or updates, please contact [place] Please consult [email or web address] for contact info under        Signed, Carlyle Dolly, MD  06/09/2020, 12:18 PM

## 2020-06-09 NOTE — Progress Notes (Signed)
Progress Note    ASSESSMENT AND PLAN:   Choledocholithiasis with obs jaundice.  E. coli bacteremia on Rocephin s/o mild asc cholangitis. LFTs trending down. TB 3.5 from 6.4  CAD s/p CABGwith multiple PCI,most recent10/25when he presented with NSTEMI. On Brilinta (last dose 11/3).   Increasing troponin (37-->308 -->3561) likely due to demand ischemia. 2DE pending.  Cardiology following.  HIV    Plan: -Trend CBC, CMP PT INR and lipase. -IV Rocephin to continue -Follow 2DE and cardiology recommendations. -Continue baby aspirin throughout. -Plan for ERCP with ES/stone extraction Monday 11/8 at 8 AM with Dr. Henrene Pastor after Brilinta washout.      SUBJECTIVE   No abdominal pain Denies having any nausea or vomiting. Tolerating p.o. well Denies having any chest pains. No fevers or chills.    OBJECTIVE:     Vital signs in last 24 hours: Temp:  [98.1 F (36.7 C)-98.9 F (37.2 C)] 98.9 F (37.2 C) (11/06 0316) Pulse Rate:  [58-94] 91 (11/06 1000) Resp:  [10-20] 20 (11/06 1000) BP: (162-201)/(62-85) 170/71 (11/06 1000) SpO2:  [95 %-98 %] 95 % (11/06 1000) Last BM Date: 06/07/20 General:   Alert, well-developed male in NAD Pulm: Normal respiratory effort, lungs CTA bilaterally without wheezes or crackles. Abdomen:  Soft, nondistended, nontender.  Normal bowel sounds,.       Neurologic:  Alert and  oriented x4;  grossly normal neurologically. Psych:  Pleasant, cooperative.  Normal mood and affect.   Intake/Output from previous day: 11/05 0701 - 11/06 0700 In: 720 [P.O.:720] Out: 2950 [Urine:2950] Intake/Output this shift: Total I/O In: 1823.9 [P.O.:240; I.V.:1483.9; IV Piggyback:100] Out: 650 [Urine:650]  Lab Results: Recent Labs    06/07/20 0436 06/08/20 0312 06/09/20 0456  WBC 3.6* 5.8 6.4  HGB 13.6 10.8* 11.8*  HCT 40.7 32.1* 35.0*  PLT 128* 121* 144*   BMET Recent Labs    06/07/20 0436 06/08/20 0312 06/09/20 0456  NA 136 142 138  K  3.6 3.5 4.0  CL 102 113* 111  CO2 19* 21* 15*  GLUCOSE 239* 113* 206*  BUN 28* 22 18  CREATININE 2.08* 1.81* 1.59*  CALCIUM 8.5* 7.8* 8.4*   LFT Recent Labs    06/09/20 0456  PROT 5.9*  ALBUMIN 2.6*  AST 82*  ALT 97*  ALKPHOS 67  BILITOT 3.5*   PT/INR Recent Labs    06/07/20 0552 06/08/20 0312  LABPROT 16.2* 16.1*  INR 1.4* 1.3*   Hepatitis Panel Recent Labs    06/07/20 0803  HEPBSAG NON REACTIVE  HCVAB NON REACTIVE  HEPAIGM NON REACTIVE  HEPBIGM NON REACTIVE    ECHOCARDIOGRAM LIMITED  Result Date: 06/08/2020    ECHOCARDIOGRAM LIMITED REPORT   Patient Name:   [name] Date of Exam: 06/08/2020 Medical Rec #:  [id number]    Height:       63.0 in Accession #:    [id number]   Weight:       134.9 lb Date of Birth:  August 16, 1945   BSA:          1.636 m Patient Age:    74 years     BP:           153/62 mmHg Patient Gender: M            HR:           69 bpm. Exam Location:  Inpatient Procedure: Limited Echo, Limited Color Doppler and Cardiac Doppler Indications:    cad of native vessel  414.01  History:        Patient has prior history of Echocardiogram examinations, most                 recent 05/28/2020. Prior CABG, chronic kidney disease. sepsis.                 HIV; Risk Factors:Hypertension, Dyslipidemia and Diabetes.  Sonographer:    Johny Chess Referring Phys: [id number] [place]  1. Compared to echo report from 05/28/20, LVEF is improved. . Left ventricular ejection fraction, by estimation, is 60 to 65%. The left ventricle has normal function. The left ventricle has no regional wall motion abnormalities.  2. Right ventricular systolic function is normal. The right ventricular size is normal. There is mildly elevated pulmonary artery systolic pressure.  3. Mild mitral valve regurgitation.  4. TR jet is eccentric, directed anterior in RA.. Tricuspid valve regurgitation is mild to moderate.  5. AV is thickened, calcified with restricted motion . Peak  and mean gradients through the valve are 25 and 15 mm Hg respectively This is increasred from echo report of 10/25.. The aortic valve is abnormal. Aortic valve regurgitation is mild.  6. The inferior vena cava is dilated in size with <50% respiratory variability, suggesting right atrial pressure of 15 mmHg. FINDINGS  Left Ventricle: Compared to echo report from 05/28/20, LVEF is improved. Left ventricular ejection fraction, by estimation, is 60 to 65%. The left ventricle has normal function. The left ventricle has no regional wall motion abnormalities. The left ventricular internal cavity size was normal in size. There is no left ventricular hypertrophy. Right Ventricle: The right ventricular size is normal. Right ventricular systolic function is normal. There is mildly elevated pulmonary artery systolic pressure. The tricuspid regurgitant velocity is 2.95 m/s, and with an assumed right atrial pressure of 5 mmHg, the estimated right ventricular systolic pressure is 71.6 mmHg. Left Atrium: Left atrial size was normal in size. Right Atrium: Right atrial size was normal in size. Pericardium: There is no evidence of pericardial effusion. Mitral Valve: The mitral valve is abnormal. There is mild thickening of the mitral valve leaflet(s). Mild to moderate mitral annular calcification. Mild mitral valve regurgitation. Tricuspid Valve: TR jet is eccentric, directed anterior in RA. The tricuspid valve is normal in structure. Tricuspid valve regurgitation is mild to moderate. Aortic Valve: AV is thickened, calcified with restricted motion . Peak and mean gradients through the valve are 25 and 15 mm Hg respectively This is increasred from echo report of 10/25. The aortic valve is abnormal. Aortic valve regurgitation is mild. Aortic regurgitation PHT measures 327 msec. Aortic valve mean gradient measures 15.0 mmHg. Aortic valve peak gradient measures 25.4 mmHg. Pulmonic Valve: The pulmonic valve was normal in structure.  Pulmonic valve regurgitation is not visualized. Aorta: The aortic root and ascending aorta are structurally normal, with no evidence of dilitation. Venous: The inferior vena cava is dilated in size with less than 50% respiratory variability, suggesting right atrial pressure of 15 mmHg. LEFT VENTRICLE PLAX 2D LVIDd:         5.00 cm Diastology LVIDs:         3.60 cm LV e' medial:    5.22 cm/s LV PW:         0.70 cm LV E/e' medial:  22.2 LV IVS:        0.90 cm LV e' lateral:   10.70 cm/s  LV E/e' lateral: 10.8  IVC IVC diam: 2.20 cm LEFT ATRIUM         Index LA diam:    4.20 cm 2.57 cm/m  AORTIC VALVE AV Vmax:           252.00 cm/s AV Vmean:          185.000 cm/s AV VTI:            0.571 m AV Peak Grad:      25.4 mmHg AV Mean Grad:      15.0 mmHg LVOT Vmax:         93.30 cm/s LVOT Vmean:        65.800 cm/s LVOT VTI:          0.210 m LVOT/AV VTI ratio: 0.37 AI PHT:            327 msec  AORTA Ao Asc diam: 3.50 cm MITRAL VALVE                TRICUSPID VALVE MV Area (PHT): 3.72 cm     TR Peak grad:   34.8 mmHg MV Decel Time: 204 msec     TR Vmax:        295.00 cm/s MV E velocity: 116.00 cm/s MV A velocity: 96.40 cm/s   SHUNTS MV E/A ratio:  1.20         Systemic VTI: 0.21 m Dorris Carnes MD Electronically signed by Dorris Carnes MD Signature Date/Time: 06/08/2020/6:54:02 PM    Final      Principal Problem:   Sepsis ([place]) Active Problems:   Human immunodeficiency virus (HIV) disease ([place])   Depression, recurrent ([place])   Chronic obstructive pulmonary disease ([place])   HIV disease ([place])   Chronic kidney disease, stage 3b ([place])   Coronary artery disease   Type 2 diabetes mellitus with stage 3b chronic kidney disease, without long-term current use of insulin ([place])   Benign prostatic hyperplasia without lower urinary tract symptoms   Chronic diastolic CHF (congestive heart failure) ([facility])   Calculus of bile duct with acute cholangitis   Pre-op evaluation     LOS: 2 days     Carmell Austria, MD  06/09/2020, 10:54 AM [facility] GI [phone]

## 2020-06-10 DIAGNOSIS — R778 Other specified abnormalities of plasma proteins: Secondary | ICD-10-CM | POA: Diagnosis not present

## 2020-06-10 LAB — BASIC METABOLIC PANEL
Anion gap: 10 (ref 5–15)
BUN: 21 mg/dL (ref 8–23)
CO2: 20 mmol/L — ABNORMAL LOW (ref 22–32)
Calcium: 8.8 mg/dL — ABNORMAL LOW (ref 8.9–10.3)
Chloride: 109 mmol/L (ref 98–111)
Creatinine, Ser: 1.6 mg/dL — ABNORMAL HIGH (ref 0.61–1.24)
GFR, Estimated: 45 mL/min — ABNORMAL LOW (ref >60)
Glucose, Bld: 137 mg/dL — ABNORMAL HIGH (ref 70–99)
Potassium: 3.9 mmol/L (ref 3.5–5.1)
Sodium: 139 mmol/L (ref 135–145)

## 2020-06-10 LAB — GLUCOSE, CAPILLARY
Glucose-Capillary: 98 mg/dL (ref 70–99)
Glucose-Capillary: 208 mg/dL — ABNORMAL HIGH (ref 70–99)
Glucose-Capillary: 130 mg/dL — ABNORMAL HIGH (ref 70–99)
Glucose-Capillary: 240 mg/dL — ABNORMAL HIGH (ref 70–99)

## 2020-06-10 NOTE — Progress Notes (Addendum)
PROGRESS NOTE    [name]  PHX:[id number] DOB: 01/18/1946 DOA: 06/07/2020 PCP: Dorothyann Peng, NP   Chief Complain: Abdominal pain  Brief Narrative:   Patient is a 74 year old male with history of coronary disease status post recent left heart cath with successful PTCA currently taking aspirin Brilinta, diabetes type 2, status 3B CKD, hypertension, HIV, chronic diastolic congestive heart failure, BPH who presents to the emergency department with complaints of abdominal pain mainly in the epigastric region and right upper quadrant.  In the emergency department, he also had fever and was tachycardic.  Patient was found to have elevated liver enzymes on presentation, elevated bilirubin, elevated lactate, elevated troponin.  CT abdomen/pelvis showed cholelithiasis, multiple stones in the distal CBD resulting in mild CBD dilation.  Procalcitonin was elevated.  Patient was admitted for the management of possible sepsis secondary to possible cholangitis.  GI ,general surgery ,cardiology following.  Plan for ERCP on Monday and also cholecystectomy by general surgery.  Assessment & Plan:   Principal Problem:   Sepsis ([place]) Active Problems:   Human immunodeficiency virus (HIV) disease (HCC)   Depression, recurrent ([place])   Chronic obstructive pulmonary disease ([place])   HIV disease ([place])   Chronic kidney disease, stage 3b ([place])   Coronary artery disease   Type 2 diabetes mellitus with stage 3b chronic kidney disease, without long-term current use of insulin ([place])   Benign prostatic hyperplasia without lower urinary tract symptoms   Chronic diastolic CHF (congestive heart failure) (HCC)   Calculus of bile duct with acute cholangitis   Pre-op evaluation   Elevated troponin   Severe sepsis secondary to possible acute cholangitis: Presented with fever, tachycardia, lactic acidosis, AKI, elevated white cell counts.  Elevated procalcitonin. continue current antibiotics, follow-up cultures.  Sepsis  physiology has resolved.  Gram-negative bacteremia: Blood cultures now showing E. coli.  Will follow final blood culture report.  Continue current antibiotics  Diarrhea: Reported 4-5 episodes of diarrhea earlier but has improved now.    Continue Imodium as needed.  Denies any abdominal pain  Choledocholithiasis/suspected cholangitis: Presented with abdominal pain mainly in the epigastric and right upper quadrant.  CT finding as above.  Right upper quadrant ultrasound showed tiny stones and sludge noted layering within the gallbladder,no secondary signs of acute cholecystitis,mild increase caliber of the common bile duct which measures up,no signs of choledocholithiasis.  Elevated liver enzymes and bilirubin.  Since he presented with sepsis-like picture along with above findings, cholangitis suspected.  GI following.  Plan for ERCP on Monday.  Improving liver enzymes, lipase level. General surgery also planning for cholecystectomy.  AKI on CKD stage IIIb: Presented with creatinine in the range of 2.  Baseline creatinine ranges from 1.5-1.8.  Kidney function improved with IV fluids.  Coronary artery disease status post CABG: History of severe coronary artery disease.  Underwent PTCA on October this year and currently on aspirin and Brilinta.  Noted elevated troponin.  Present denies any chest pain.  Elevated troponin could be from supply demand ischemia from sepsis.  Continue nitrates, beta-blockers.  Statin on hold due to elevated liver enzymes. Cardiology following and recommended to restart Brilinta when there is no further plan for operative intervention.  Diastolic congestive heart failure: lasix on hold.  On gentle IV fluids due to concern for sepsis, dehydration and AKI.  Continue current medications.Echo done on this admission showed ejection fraction of 60 to 65%, no wall motion abnormality.  HIV: Continue HIV therapy  Hypertension:  Continue current regimen.  Monitor blood  pressure             DVT prophylaxis:SCD Code Status: Full Family Communication: called daughter on phone,call not received Status is: Inpatient  Remains inpatient appropriate because:Inpatient level of care appropriate due to severity of illness   Dispo: The patient is from: Home              Anticipated d/c is to: Home              Anticipated d/c date is: 3 days              Patient currently is not medically stable to d/c. Plan for ERCP on Monday followed by cholecystectomy by general surgery     Consultants: GI,cardiology  Procedures:None  Antimicrobials:  Anti-infectives (From admission, onward)   Start     Dose/Rate Route Frequency Ordered Stop   06/08/20 0800  cefTRIAXone (ROCEPHIN) 2 g in sodium chloride 0.9 % 100 mL IVPB        2 g 200 mL/hr over 30 Minutes Intravenous Every 24 hours 06/07/20 1102     06/07/20 1430  doravirine (PIFELTRO) tablet 100 mg       Note to Pharmacy: OP YBW:LSLH 1 TABLET(100 MG) BY MOUTH DAILY with Tivicay Patient taking differently: Take 100 mg by mouth in the morning (with Tivicay)     100 mg Oral Every morning 06/07/20 1102     06/07/20 1345  dolutegravir (TIVICAY) tablet 50 mg        50 mg Oral Every morning 06/07/20 1102     06/07/20 1200  metroNIDAZOLE (FLAGYL) IVPB 500 mg  Status:  Discontinued        500 mg 100 mL/hr over 60 Minutes Intravenous Every 8 hours 06/07/20 1102 06/07/20 2318   06/07/20 1115  valACYclovir (VALTREX) tablet 500 mg        500 mg Oral Every morning 06/07/20 1102     06/07/20 0715  piperacillin-tazobactam (ZOSYN) IVPB 3.375 g        3.375 g 100 mL/hr over 30 Minutes Intravenous  Once 06/07/20 0703 06/07/20 0831   06/07/20 0630  cefTRIAXone (ROCEPHIN) 1 g in sodium chloride 0.9 % 100 mL IVPB        1 g 200 mL/hr over 30 Minutes Intravenous  Once 06/07/20 0618 06/07/20 0705      Subjective: Patient seen and examined at the bedside this morning.  Comfortable.  Hemodynamically stable.  Denies any  nausea, vomiting or abdominal pain.  Diarrhea has stopped.  Objective: Vitals:   06/10/20 0349 06/10/20 0400 06/10/20 0600 06/10/20 0751  BP: (!) 181/73 (!) 168/65 (!) 189/77 (!) 152/76  Pulse: 79 80 84 79  Resp: 13 (!) 22 16 17   Temp: 98.5 F (36.9 C)   98 F (36.7 C)  TempSrc: Oral   Oral  SpO2: 96% 95% 98% 98%  Weight:      Height:        Intake/Output Summary (Last 24 hours) at 06/10/2020 0850 Last data filed at 06/10/2020 0350 Gross per 24 hour  Intake 2162.98 ml  Output 2500 ml  Net -337.02 ml   Filed Weights   06/07/20 1944  Weight: 61.2 kg    Examination:  General exam: Appears calm and comfortable ,Not in distress,average built HEENT:PERRL,Oral mucosa moist, Ear/Nose normal on gross exam Respiratory system: Bilateral equal air entry, normal vesicular breath sounds, no wheezes or crackles  Cardiovascular system: S1 & S2 heard, RRR. No JVD, murmurs, rubs,  gallops or clicks. Gastrointestinal system: Abdomen is nondistended, soft and nontender. No organomegaly or masses felt. Normal bowel sounds heard. Central nervous system: Alert and oriented. No focal neurological deficits. Extremities: No edema, no clubbing ,no cyanosis Skin: No rashes, lesions or ulcers,no icterus ,no pallor   Data Reviewed: I have personally reviewed following labs and imaging studies  CBC: Recent Labs  Lab 06/07/20 0436 06/08/20 0312 06/09/20 0456  WBC 3.6* 5.8 6.4  NEUTROABS 3.4  --  5.3  HGB 13.6 10.8* 11.8*  HCT 40.7 32.1* 35.0*  MCV 97.1 93.3 92.8  PLT 128* 121* 536*   Basic Metabolic Panel: Recent Labs  Lab 06/05/20 1101 06/07/20 0436 06/08/20 0312 06/09/20 0456  NA 140 136 142 138  K 4.5 3.6 3.5 4.0  CL 105 102 113* 111  CO2 25 19* 21* 15*  GLUCOSE 276* 239* 113* 206*  BUN 30* 28* 22 18  CREATININE 2.10* 2.08* 1.81* 1.59*  CALCIUM 9.2 8.5* 7.8* 8.4*   GFR: Estimated Creatinine Clearance: 33.8 mL/min (A) (by C-G formula based on SCr of 1.59 mg/dL (H)). Liver  Function Tests: Recent Labs  Lab 06/05/20 1101 06/07/20 0436 06/09/20 0456  AST 56* 133* 82*  ALT 69* 162* 97*  ALKPHOS  --  79 67  BILITOT 1.2 6.4* 3.5*  PROT 6.6 7.5 5.9*  ALBUMIN  --  3.7 2.6*   Recent Labs  Lab 06/07/20 0436 06/09/20 0456  LIPASE 66* 88*   No results for input(s): AMMONIA in the last 168 hours. Coagulation Profile: Recent Labs  Lab 06/07/20 0552 06/08/20 0312  INR 1.4* 1.3*   Cardiac Enzymes: No results for input(s): CKTOTAL, CKMB, CKMBINDEX, TROPONINI in the last 168 hours. BNP (last 3 results) No results for input(s): PROBNP in the last 8760 hours. HbA1C: No results for input(s): HGBA1C in the last 72 hours. CBG: Recent Labs  Lab 06/08/20 2117 06/09/20 0742 06/09/20 1142 06/09/20 2048 06/10/20 0752  GLUCAP 196* 172* 197* 116* 98   Lipid Profile: No results for input(s): CHOL, HDL, LDLCALC, TRIG, CHOLHDL, LDLDIRECT in the last 72 hours. Thyroid Function Tests: No results for input(s): TSH, T4TOTAL, FREET4, T3FREE, THYROIDAB in the last 72 hours. Anemia Panel: No results for input(s): VITAMINB12, FOLATE, FERRITIN, TIBC, IRON, RETICCTPCT in the last 72 hours. Sepsis Labs: Recent Labs  Lab 06/07/20 0552 06/07/20 0803 06/08/20 0312  PROCALCITON  --   --  60.40  LATICACIDVEN 1.5 1.0  --     Recent Results (from the past 240 hour(s))  Respiratory Panel by RT PCR (Flu A&B, Covid) - Nasopharyngeal Swab     Status: None   Collection Time: 06/07/20  5:51 AM   Specimen: Nasopharyngeal Swab  Result Value Ref Range Status   SARS Coronavirus 2 by RT PCR NEGATIVE NEGATIVE Final    Comment: (NOTE) SARS-CoV-2 target nucleic acids are NOT DETECTED.  The SARS-CoV-2 RNA is generally detectable in upper respiratoy specimens during the acute phase of infection. The lowest concentration of SARS-CoV-2 viral copies this assay can detect is 131 copies/mL. A negative result does not preclude SARS-Cov-2 infection and should not be used as the sole  basis for treatment or other patient management decisions. A negative result may occur with  improper specimen collection/handling, submission of specimen other than nasopharyngeal swab, presence of viral mutation(s) within the areas targeted by this assay, and inadequate number of viral copies (<131 copies/mL). A negative result must be combined with clinical observations, patient history, and epidemiological information. The expected result is  Negative.  Fact Sheet for Patients:  PinkCheek.be  Fact Sheet for Healthcare Providers:  GravelBags.it  This test is no t yet approved or cleared by the [place] FDA and  has been authorized for detection and/or diagnosis of SARS-CoV-2 by FDA under an Emergency Use Authorization (EUA). This EUA will remain  in effect (meaning this test can be used) for the duration of the COVID-19 declaration under Section 564(b)(1) of the Act, 21 U.S.C. section 360bbb-3(b)(1), unless the authorization is terminated or revoked sooner.     Influenza A by PCR NEGATIVE NEGATIVE Final   Influenza B by PCR NEGATIVE NEGATIVE Final    Comment: (NOTE) The Xpert Xpress SARS-CoV-2/FLU/RSV assay is intended as an aid in  the diagnosis of influenza from Nasopharyngeal swab specimens and  should not be used as a sole basis for treatment. Nasal washings and  aspirates are unacceptable for Xpert Xpress SARS-CoV-2/FLU/RSV  testing.  Fact Sheet for Patients: PinkCheek.be  Fact Sheet for Healthcare Providers: GravelBags.it  This test is not yet approved or cleared by the [place] FDA and  has been authorized for detection and/or diagnosis of SARS-CoV-2 by  FDA under an Emergency Use Authorization (EUA). This EUA will remain  in effect (meaning this test can be used) for the duration of the  Covid-19 declaration under Section 564(b)(1) of the  Act, 21  U.S.C. section 360bbb-3(b)(1), unless the authorization is  terminated or revoked. Performed at [facility], [place] [address], [place], [place] [address]   Blood culture (routine single)     Status: Abnormal   Collection Time: 06/07/20  5:52 AM   Specimen: BLOOD  Result Value Ref Range Status   Specimen Description BLOOD SITE NOT SPECIFIED  Final   Special Requests   Final    BOTTLES DRAWN AEROBIC AND ANAEROBIC Blood Culture adequate volume   Culture  Setup Time   Final    GRAM NEGATIVE RODS IN BOTH AEROBIC AND ANAEROBIC BOTTLES CRITICAL RESULT CALLED TO, READ BACK BY AND VERIFIED WITH: PHARMD G ABBOTT 06/07/20 AT 2313 SK Performed at [facility], [place] [address], [place], [place] [address]    Culture ESCHERICHIA COLI (A)  Final   Report Status 06/09/2020 FINAL  Final   Organism ID, Bacteria ESCHERICHIA COLI  Final      Susceptibility   Escherichia coli - MIC*    AMPICILLIN <=2 SENSITIVE Sensitive     CEFAZOLIN <=4 SENSITIVE Sensitive     CEFEPIME <=0.12 SENSITIVE Sensitive     CEFTAZIDIME <=1 SENSITIVE Sensitive     CEFTRIAXONE <=0.25 SENSITIVE Sensitive     CIPROFLOXACIN <=0.25 SENSITIVE Sensitive     GENTAMICIN <=1 SENSITIVE Sensitive     IMIPENEM <=0.25 SENSITIVE Sensitive     TRIMETH/SULFA <=20 SENSITIVE Sensitive     AMPICILLIN/SULBACTAM <=2 SENSITIVE Sensitive     PIP/TAZO <=4 SENSITIVE Sensitive     * ESCHERICHIA COLI  Blood Culture ID Panel (Reflexed)     Status: Abnormal   Collection Time: 06/07/20  5:52 AM  Result Value Ref Range Status   Enterococcus faecalis NOT DETECTED NOT DETECTED Final   Enterococcus Faecium NOT DETECTED NOT DETECTED Final   Listeria monocytogenes NOT DETECTED NOT DETECTED Final   Staphylococcus species NOT DETECTED NOT DETECTED Final   Staphylococcus aureus (BCID) NOT DETECTED NOT DETECTED Final   Staphylococcus epidermidis NOT DETECTED NOT DETECTED Final   Staphylococcus lugdunensis NOT DETECTED NOT DETECTED Final    Streptococcus species NOT DETECTED NOT DETECTED  Final   Streptococcus agalactiae NOT DETECTED NOT DETECTED Final   Streptococcus pneumoniae NOT DETECTED NOT DETECTED Final   Streptococcus pyogenes NOT DETECTED NOT DETECTED Final   A.calcoaceticus-baumannii NOT DETECTED NOT DETECTED Final   Bacteroides fragilis NOT DETECTED NOT DETECTED Final   Enterobacterales DETECTED (A) NOT DETECTED Final    Comment: Enterobacterales represent a large order of gram negative bacteria, not a single organism. CRITICAL RESULT CALLED TO, READ BACK BY AND VERIFIED WITH: PHARMD G ABBOTT 06/07/20 AT 2313 SK    Enterobacter cloacae complex NOT DETECTED NOT DETECTED Final   Escherichia coli DETECTED (A) NOT DETECTED Final    Comment: CRITICAL RESULT CALLED TO, READ BACK BY AND VERIFIED WITH: PHARMD G ABBOTT 06/07/20 AT 2313 SK    Klebsiella aerogenes NOT DETECTED NOT DETECTED Final   Klebsiella oxytoca NOT DETECTED NOT DETECTED Final   Klebsiella pneumoniae NOT DETECTED NOT DETECTED Final   Proteus species NOT DETECTED NOT DETECTED Final   Salmonella species NOT DETECTED NOT DETECTED Final   Serratia marcescens NOT DETECTED NOT DETECTED Final   Haemophilus influenzae NOT DETECTED NOT DETECTED Final   Neisseria meningitidis NOT DETECTED NOT DETECTED Final   Pseudomonas aeruginosa NOT DETECTED NOT DETECTED Final   Stenotrophomonas maltophilia NOT DETECTED NOT DETECTED Final   Candida albicans NOT DETECTED NOT DETECTED Final   Candida auris NOT DETECTED NOT DETECTED Final   Candida glabrata NOT DETECTED NOT DETECTED Final   Candida krusei NOT DETECTED NOT DETECTED Final   Candida parapsilosis NOT DETECTED NOT DETECTED Final   Candida tropicalis NOT DETECTED NOT DETECTED Final   Cryptococcus neoformans/gattii NOT DETECTED NOT DETECTED Final   CTX-M ESBL NOT DETECTED NOT DETECTED Final   Carbapenem resistance IMP NOT DETECTED NOT DETECTED Final   Carbapenem resistance KPC NOT DETECTED NOT DETECTED Final    Carbapenem resistance NDM NOT DETECTED NOT DETECTED Final   Carbapenem resist OXA 48 LIKE NOT DETECTED NOT DETECTED Final   Carbapenem resistance VIM NOT DETECTED NOT DETECTED Final    Comment: Performed at [facility], [address] [place], [place] [address]  Gastrointestinal Panel by PCR , Stool     Status: None   Collection Time: 06/08/20 10:03 AM   Specimen: Stool  Result Value Ref Range Status   Campylobacter species NOT DETECTED NOT DETECTED Final   Plesimonas shigelloides NOT DETECTED NOT DETECTED Final   Salmonella species NOT DETECTED NOT DETECTED Final   Yersinia enterocolitica NOT DETECTED NOT DETECTED Final   Vibrio species NOT DETECTED NOT DETECTED Final   Vibrio cholerae NOT DETECTED NOT DETECTED Final   Enteroaggregative E coli (EAEC) NOT DETECTED NOT DETECTED Final   Enteropathogenic E coli (EPEC) NOT DETECTED NOT DETECTED Final   Enterotoxigenic E coli (ETEC) NOT DETECTED NOT DETECTED Final   Shiga like toxin producing E coli (STEC) NOT DETECTED NOT DETECTED Final   Shigella/Enteroinvasive E coli (EIEC) NOT DETECTED NOT DETECTED Final   Cryptosporidium NOT DETECTED NOT DETECTED Final   Cyclospora cayetanensis NOT DETECTED NOT DETECTED Final   Entamoeba histolytica NOT DETECTED NOT DETECTED Final   Giardia lamblia NOT DETECTED NOT DETECTED Final   Adenovirus F40/41 NOT DETECTED NOT DETECTED Final   Astrovirus NOT DETECTED NOT DETECTED Final   Norovirus GI/GII NOT DETECTED NOT DETECTED Final   Rotavirus A NOT DETECTED NOT DETECTED Final   Sapovirus (I, II, IV, and V) NOT DETECTED NOT DETECTED Final    Comment: Performed at [facility], [address] [place],  [place] [address]         Radiology Studies: ECHOCARDIOGRAM LIMITED  Result Date: 06/08/2020    ECHOCARDIOGRAM LIMITED REPORT   Patient Name:   EARLE TROIANO Bonsall Date of Exam: 06/08/2020 Medical Rec #:  [id number]    Height:       63.0 in Accession #:    [id number]   Weight:       134.9 lb Date  of Birth:  August 19, 1945   BSA:          1.636 m Patient Age:    12 years     BP:           153/62 mmHg Patient Gender: M            HR:           69 bpm. Exam Location:  Inpatient Procedure: Limited Echo, Limited Color Doppler and Cardiac Doppler Indications:    cad of native vessel 414.01  History:        Patient has prior history of Echocardiogram examinations, most                 recent 05/28/2020. Prior CABG, chronic kidney disease. sepsis.                 HIV; Risk Factors:Hypertension, Dyslipidemia and Diabetes.  Sonographer:    Johny Chess Referring Phys: [id number] [place]  1. Compared to echo report from 05/28/20, LVEF is improved. . Left ventricular ejection fraction, by estimation, is 60 to 65%. The left ventricle has normal function. The left ventricle has no regional wall motion abnormalities.  2. Right ventricular systolic function is normal. The right ventricular size is normal. There is mildly elevated pulmonary artery systolic pressure.  3. Mild mitral valve regurgitation.  4. TR jet is eccentric, directed anterior in RA.. Tricuspid valve regurgitation is mild to moderate.  5. AV is thickened, calcified with restricted motion . Peak and mean gradients through the valve are 25 and 15 mm Hg respectively This is increasred from echo report of 10/25.. The aortic valve is abnormal. Aortic valve regurgitation is mild.  6. The inferior vena cava is dilated in size with <50% respiratory variability, suggesting right atrial pressure of 15 mmHg. FINDINGS  Left Ventricle: Compared to echo report from 05/28/20, LVEF is improved. Left ventricular ejection fraction, by estimation, is 60 to 65%. The left ventricle has normal function. The left ventricle has no regional wall motion abnormalities. The left ventricular internal cavity size was normal in size. There is no left ventricular hypertrophy. Right Ventricle: The right ventricular size is normal. Right ventricular systolic  function is normal. There is mildly elevated pulmonary artery systolic pressure. The tricuspid regurgitant velocity is 2.95 m/s, and with an assumed right atrial pressure of 5 mmHg, the estimated right ventricular systolic pressure is 43.3 mmHg. Left Atrium: Left atrial size was normal in size. Right Atrium: Right atrial size was normal in size. Pericardium: There is no evidence of pericardial effusion. Mitral Valve: The mitral valve is abnormal. There is mild thickening of the mitral valve leaflet(s). Mild to moderate mitral annular calcification. Mild mitral valve regurgitation. Tricuspid Valve: TR jet is eccentric, directed anterior in RA. The tricuspid valve is normal in structure. Tricuspid valve regurgitation is mild to moderate. Aortic Valve: AV is thickened, calcified with restricted motion . Peak and mean gradients through the valve are 25 and 15 mm Hg respectively This is increasred from echo report of 10/25. The aortic valve  is abnormal. Aortic valve regurgitation is mild. Aortic regurgitation PHT measures 327 msec. Aortic valve mean gradient measures 15.0 mmHg. Aortic valve peak gradient measures 25.4 mmHg. Pulmonic Valve: The pulmonic valve was normal in structure. Pulmonic valve regurgitation is not visualized. Aorta: The aortic root and ascending aorta are structurally normal, with no evidence of dilitation. Venous: The inferior vena cava is dilated in size with less than 50% respiratory variability, suggesting right atrial pressure of 15 mmHg. LEFT VENTRICLE PLAX 2D LVIDd:         5.00 cm Diastology LVIDs:         3.60 cm LV e' medial:    5.22 cm/s LV PW:         0.70 cm LV E/e' medial:  22.2 LV IVS:        0.90 cm LV e' lateral:   10.70 cm/s                        LV E/e' lateral: 10.8  IVC IVC diam: 2.20 cm LEFT ATRIUM         Index LA diam:    4.20 cm 2.57 cm/m  AORTIC VALVE AV Vmax:           252.00 cm/s AV Vmean:          185.000 cm/s AV VTI:            0.571 m AV Peak Grad:      25.4 mmHg AV  Mean Grad:      15.0 mmHg LVOT Vmax:         93.30 cm/s LVOT Vmean:        65.800 cm/s LVOT VTI:          0.210 m LVOT/AV VTI ratio: 0.37 AI PHT:            327 msec  AORTA Ao Asc diam: 3.50 cm MITRAL VALVE                TRICUSPID VALVE MV Area (PHT): 3.72 cm     TR Peak grad:   34.8 mmHg MV Decel Time: 204 msec     TR Vmax:        295.00 cm/s MV E velocity: 116.00 cm/s MV A velocity: 96.40 cm/s   SHUNTS MV E/A ratio:  1.20         Systemic VTI: 0.21 m Dorris Carnes MD Electronically signed by Dorris Carnes MD Signature Date/Time: 06/08/2020/6:54:02 PM    Final         Scheduled Meds: . acidophilus  1 capsule Oral Q breakfast  . aspirin EC  81 mg Oral Daily  . B-complex with vitamin C  1 tablet Oral Q breakfast  . cholecalciferol  2,000 Units Oral q AM  . dolutegravir  50 mg Oral q AM  . doravirine  100 mg Oral q AM  . insulin aspart  0-15 Units Subcutaneous TID WC  . isosorbide mononitrate  30 mg Oral Daily  . metoprolol tartrate  12.5 mg Oral BID  . pantoprazole  40 mg Oral QAC breakfast  . potassium chloride SA  20 mEq Oral Daily  . sodium bicarbonate  650 mg Oral TID  . tamsulosin  0.4 mg Oral Daily  . valACYclovir  500 mg Oral q AM   Continuous Infusions: . cefTRIAXone (ROCEPHIN)  IV Stopped (06/09/20 0938)     LOS: 3 days    Time spent: 35 mins.More than 50% of  that time was spent in counseling and/or coordination of care.      Shelly Coss, MD [facility] P11/02/2020, 8:50 AM

## 2020-06-10 NOTE — Plan of Care (Signed)
Problem: Education: Goal: Knowledge of General Education information will improve Description: Including pain rating scale, medication(s)/side effects and non-pharmacologic comfort measures 06/10/2020 2054 by Berta Minor, RN Outcome: Progressing 06/10/2020 2051 by Berta Minor, RN Outcome: Progressing   Problem: Health Behavior/Discharge Planning: Goal: Ability to manage health-related needs will improve 06/10/2020 2054 by Berta Minor, RN Outcome: Progressing 06/10/2020 2051 by Berta Minor, RN Outcome: Progressing   Problem: Clinical Measurements: Goal: Ability to maintain clinical measurements within normal limits will improve 06/10/2020 2054 by Berta Minor, RN Outcome: Progressing 06/10/2020 2051 by Berta Minor, RN Outcome: Progressing Goal: Will remain free from infection 06/10/2020 2054 by Berta Minor, RN Outcome: Progressing 06/10/2020 2051 by Berta Minor, RN Outcome: Progressing Goal: Diagnostic test results will improve 06/10/2020 2054 by Berta Minor, RN Outcome: Progressing 06/10/2020 2051 by Berta Minor, RN Outcome: Progressing Goal: Respiratory complications will improve 06/10/2020 2054 by Berta Minor, RN Outcome: Progressing 06/10/2020 2051 by Berta Minor, RN Outcome: Progressing Goal: Cardiovascular complication will be avoided 06/10/2020 2054 by Berta Minor, RN Outcome: Progressing 06/10/2020 2051 by Berta Minor, RN Outcome: Progressing   Problem: Activity: Goal: Risk for activity intolerance will decrease 06/10/2020 2054 by Berta Minor, RN Outcome: Progressing 06/10/2020 2051 by Berta Minor, RN Outcome: Progressing   Problem: Nutrition: Goal: Adequate nutrition will be maintained 06/10/2020 2054 by Berta Minor, RN Outcome: Progressing 06/10/2020 2051 by Berta Minor, RN Outcome: Progressing   Problem:  Coping: Goal: Level of anxiety will decrease 06/10/2020 2054 by Berta Minor, RN Outcome: Progressing 06/10/2020 2051 by Berta Minor, RN Outcome: Progressing   Problem: Elimination: Goal: Will not experience complications related to bowel motility 06/10/2020 2054 by Berta Minor, RN Outcome: Progressing 06/10/2020 2051 by Berta Minor, RN Outcome: Progressing Goal: Will not experience complications related to urinary retention 06/10/2020 2054 by Berta Minor, RN Outcome: Progressing 06/10/2020 2051 by Berta Minor, RN Outcome: Progressing   Problem: Pain Managment: Goal: General experience of comfort will improve 06/10/2020 2054 by Berta Minor, RN Outcome: Progressing 06/10/2020 2051 by Berta Minor, RN Outcome: Progressing   Problem: Safety: Goal: Ability to remain free from injury will improve 06/10/2020 2054 by Berta Minor, RN Outcome: Progressing 06/10/2020 2051 by Berta Minor, RN Outcome: Progressing   Problem: Skin Integrity: Goal: Risk for impaired skin integrity will decrease 06/10/2020 2054 by Berta Minor, RN Outcome: Progressing 06/10/2020 2051 by Berta Minor, RN Outcome: Progressing   Problem: Education: Goal: Knowledge of disease or condition will improve 06/10/2020 2054 by Berta Minor, RN Outcome: Progressing 06/10/2020 2051 by Berta Minor, RN Outcome: Progressing Goal: Knowledge of the prescribed therapeutic regimen will improve 06/10/2020 2054 by Berta Minor, RN Outcome: Progressing 06/10/2020 2051 by Berta Minor, RN Outcome: Progressing Goal: Individualized Educational Video(s) 06/10/2020 2054 by Berta Minor, RN Outcome: Progressing 06/10/2020 2051 by Berta Minor, RN Outcome: Progressing   Problem: Activity: Goal: Ability to tolerate increased activity will improve 06/10/2020 2054 by Berta Minor,  RN Outcome: Progressing 06/10/2020 2051 by Berta Minor, RN Outcome: Progressing Goal: Will verbalize the importance of balancing activity with adequate rest periods 06/10/2020 2054 by Berta Minor, RN Outcome: Progressing 06/10/2020 2051 by Berta Minor, RN Outcome: Progressing   Problem: Respiratory: Goal: Ability to maintain a clear airway will improve 06/10/2020 2054 by Berta Minor, RN Outcome: Progressing 06/10/2020 2051  by Berta Minor, RN Outcome: Progressing Goal: Levels of oxygenation will improve 06/10/2020 2054 by Berta Minor, RN Outcome: Progressing 06/10/2020 2051 by Berta Minor, RN Outcome: Progressing Goal: Ability to maintain adequate ventilation will improve 06/10/2020 2054 by Berta Minor, RN Outcome: Progressing 06/10/2020 2051 by Berta Minor, RN Outcome: Progressing

## 2020-06-10 NOTE — Anesthesia Preprocedure Evaluation (Addendum)
Anesthesia Evaluation    Reviewed: Allergy & Precautions, Patient's Chart, lab work & pertinent test results  Airway Mallampati: II  TM Distance: >3 FB Neck ROM: Full    Dental  (+) Teeth Intact, Dental Advisory Given   Pulmonary COPD,    Pulmonary exam normal breath sounds clear to auscultation       Cardiovascular hypertension, Pt. on medications and Pt. on home beta blockers + angina + CAD, + Past MI, + Cardiac Stents (2016, 2018, 2021), + CABG (2003), + Peripheral Vascular Disease and +CHF  Normal cardiovascular exam Rhythm:Regular Rate:Normal  a. s/p CABG in 2003 b. s/p PCI to SVG-PDA in 07/2015 c. 11/2016: cath showing severe native CAD with patent LIMA-LAD and SVG-D1 with 80% stenosis of SVG-OM1-OM2. Initially medical management was recommended --> presented with recurrent angina --> s/p Synergy DES to proximal body of SVG-OM1-OM2, POBA to distal graft Most recently, LHC 05/28/2020 Conclusions: 1.Severe native coronary artery disease, as detailed below. 2.Widely patent LIMA to diagonal/LAD. 3.Patent SVG to diagonal with 90% in-stent restenosis involving the distal anastomosis. 4.Patent SVG to ramus intermedius with chronic total occlusion of jump portion to OM1.  There is 30 to 40% in-stent restenosis at the ostium of the SVG to ramus intermedius. 5.Chronic total occlusion of sequential SVG to RPDA and RPL. 6.Normal left ventricular filling pressure. 7.Successful PTCA to distal SVG to diagonal in-stent restenosis with 0% residual stenosis and TIMI-3 flow. Recommendations: 1.Dual antiplatelet therapy with aspirin and ticagrelor for at least 12 months. 2.Aggressive secondary prevention. 3.Gentle post catheterization hydration given chronic kidney disease. 4.Remove right femoral artery sheath 2 hours after discontinuation of bivalirudin  TTE 06/08/2020 1. Compared to echo report from 05/28/20, LVEF is improved. . Left ventricular  ejection fraction, by estimation, is 60 to 65%. The left ventricle has normal function. The left ventricle has no regional wall motion abnormalities.  2. Right ventricular systolic function is normal. The right ventricular size is normal. There is mildly elevated pulmonary artery systolic pressure.  3. Mild mitral valve regurgitation.  4. TR jet is eccentric, directed anterior in RA.. Tricuspid valve regurgitation is mild to moderate.  5. AV is thickened, calcified with restricted motion . Peak and mean gradients through the valve are 25 and 15 mm Hg respectively This is increasred from echo report of 10/25.. The aortic valve is abnormal.  Aortic valve regurgitation is mild.  6. The inferior vena cava is dilated in size with <50% respiratory variability, suggesting right atrial pressure of 15 mmHg   Neuro/Psych PSYCHIATRIC DISORDERS Depression negative neurological ROS     GI/Hepatic Neg liver ROS, GERD  ,  Endo/Other  negative endocrine ROSdiabetes, Oral Hypoglycemic Agents  Renal/GU Renal InsufficiencyRenal disease  negative genitourinary   Musculoskeletal  (+) Arthritis ,   Abdominal   Peds  Hematology  (+) HIV, On brilinta   Anesthesia Other Findings ERCP for choledocholithiasis.  Presented in October with NSTEMI. Underwent successful PTCA. Presented 11/4 with sepsis, E coli bacteremia, and choledocholithiasis.   Reproductive/Obstetrics                            Anesthesia Physical Anesthesia Plan  ASA: IV  Anesthesia Plan: General   Post-op Pain Management:    Induction: Intravenous  PONV Risk Score and Plan: 2 and Dexamethasone, Ondansetron and Treatment may vary due to age or medical condition  Airway Management Planned: Oral ETT  Additional Equipment:   Intra-op Plan:   Post-operative Plan:  Extubation in OR  Informed Consent: I have reviewed the patients History and Physical, chart, labs and discussed the procedure including  the risks, benefits and alternatives for the proposed anesthesia with the patient or authorized representative who has indicated his/her understanding and acceptance.     Dental advisory given  Plan Discussed with: CRNA  Anesthesia Plan Comments:         Anesthesia Quick Evaluation

## 2020-06-10 NOTE — H&P (View-Only) (Signed)
Progress Note    ASSESSMENT AND PLAN:   Choledocholithiasiswithobs jaundice.E. coli bacteremia on Rocephin s/omild asc cholangitis. LFTs trending down. TB 3.5 from 6.4  CAD s/p CABGwith multiple PCI,most recent10/25when he presented with NSTEMI. On Brilinta (last dose 11/3).  Increase in troponind/t demand ischemia.  Getting better.  Appreciate cardiology input.  HIV   Plan: -ERCP in a.m. with ES/stone extraction with Dr. Henrene Pastor.  Brilinta on hold.  He would need laparoscopic cholecystectomy thereafter. -IV Rocephin to continue -Continue baby ASA -Recheck labs in AM.     SUBJECTIVE   Doing very well.  No abdominal pain.  No fever or chills.    OBJECTIVE:     Vital signs in last 24 hours: Temp:  [98 F (36.7 C)-98.6 F (37 C)] 98 F (36.7 C) (11/07 0751) Pulse Rate:  [68-95] 84 (11/07 0900) Resp:  [13-23] 16 (11/07 0900) BP: (143-189)/(64-77) 160/69 (11/07 0900) SpO2:  [95 %-98 %] 97 % (11/07 0900) Last BM Date: 06/07/20 General:   Alert, well-developed male in NAD EENT:  Normal hearing, non icteric sclera, conjunctive pink.  Heart:  Regular rate and rhythm; no murmur.  No lower extremity edema   Pulm: Normal respiratory effort, lungs CTA bilaterally without wheezes or crackles. Abdomen:  Soft, nondistended, nontender.  Normal bowel sounds,.       Neurologic:  Alert and  oriented x4;  grossly normal neurologically. Psych:  Pleasant, cooperative.  Normal mood and affect.   Intake/Output from previous day: 11/06 0701 - 11/07 0700 In: 2163 [P.O.:480; I.V.:1483.9; IV Piggyback:199.1] Out: 2500 [Urine:2500] Intake/Output this shift: Total I/O In: 340.1 [P.O.:240; IV Piggyback:100.1] Out: 600 [Urine:600]  Lab Results: Recent Labs    06/08/20 0312 06/09/20 0456  WBC 5.8 6.4  HGB 10.8* 11.8*  HCT 32.1* 35.0*  PLT 121* 144*   BMET Recent Labs    06/08/20 0312 06/09/20 0456 06/10/20 0856  NA 142 138 139  K 3.5 4.0 3.9  CL  113* 111 109  CO2 21* 15* 20*  GLUCOSE 113* 206* 137*  BUN 22 18 21   CREATININE 1.81* 1.59* 1.60*  CALCIUM 7.8* 8.4* 8.8*   LFT Recent Labs    06/09/20 0456  PROT 5.9*  ALBUMIN 2.6*  AST 82*  ALT 97*  ALKPHOS 67  BILITOT 3.5*   PT/INR Recent Labs    06/08/20 0312  LABPROT 16.1*  INR 1.3*   Hepatitis Panel No results for input(s): HEPBSAG, HCVAB, HEPAIGM, HEPBIGM in the last 72 hours.  ECHOCARDIOGRAM LIMITED  Result Date: 06/08/2020    ECHOCARDIOGRAM LIMITED REPORT   Patient Name:   [name] Date of Exam: 06/08/2020 Medical Rec #:  [id number]    Height:       63.0 in Accession #:    [id number]   Weight:       134.9 lb Date of Birth:  08-Nov-1945   BSA:          1.636 m Patient Age:    74 years     BP:           153/62 mmHg Patient Gender: M            HR:           69 bpm. Exam Location:  Inpatient Procedure: Limited Echo, Limited Color Doppler and Cardiac Doppler Indications:    cad of native vessel 414.01  History:        Patient has prior history of Echocardiogram examinations, most  recent 05/28/2020. Prior CABG, chronic kidney disease. sepsis.                 HIV; Risk Factors:Hypertension, Dyslipidemia and Diabetes.  Sonographer:    Johny Chess Referring Phys: [id number] [place]  1. Compared to echo report from 05/28/20, LVEF is improved. . Left ventricular ejection fraction, by estimation, is 60 to 65%. The left ventricle has normal function. The left ventricle has no regional wall motion abnormalities.  2. Right ventricular systolic function is normal. The right ventricular size is normal. There is mildly elevated pulmonary artery systolic pressure.  3. Mild mitral valve regurgitation.  4. TR jet is eccentric, directed anterior in RA.. Tricuspid valve regurgitation is mild to moderate.  5. AV is thickened, calcified with restricted motion . Peak and mean gradients through the valve are 25 and 15 mm Hg respectively This is increasred  from echo report of 10/25.. The aortic valve is abnormal. Aortic valve regurgitation is mild.  6. The inferior vena cava is dilated in size with <50% respiratory variability, suggesting right atrial pressure of 15 mmHg. FINDINGS  Left Ventricle: Compared to echo report from 05/28/20, LVEF is improved. Left ventricular ejection fraction, by estimation, is 60 to 65%. The left ventricle has normal function. The left ventricle has no regional wall motion abnormalities. The left ventricular internal cavity size was normal in size. There is no left ventricular hypertrophy. Right Ventricle: The right ventricular size is normal. Right ventricular systolic function is normal. There is mildly elevated pulmonary artery systolic pressure. The tricuspid regurgitant velocity is 2.95 m/s, and with an assumed right atrial pressure of 5 mmHg, the estimated right ventricular systolic pressure is 67.5 mmHg. Left Atrium: Left atrial size was normal in size. Right Atrium: Right atrial size was normal in size. Pericardium: There is no evidence of pericardial effusion. Mitral Valve: The mitral valve is abnormal. There is mild thickening of the mitral valve leaflet(s). Mild to moderate mitral annular calcification. Mild mitral valve regurgitation. Tricuspid Valve: TR jet is eccentric, directed anterior in RA. The tricuspid valve is normal in structure. Tricuspid valve regurgitation is mild to moderate. Aortic Valve: AV is thickened, calcified with restricted motion . Peak and mean gradients through the valve are 25 and 15 mm Hg respectively This is increasred from echo report of 10/25. The aortic valve is abnormal. Aortic valve regurgitation is mild. Aortic regurgitation PHT measures 327 msec. Aortic valve mean gradient measures 15.0 mmHg. Aortic valve peak gradient measures 25.4 mmHg. Pulmonic Valve: The pulmonic valve was normal in structure. Pulmonic valve regurgitation is not visualized. Aorta: The aortic root and ascending aorta are  structurally normal, with no evidence of dilitation. Venous: The inferior vena cava is dilated in size with less than 50% respiratory variability, suggesting right atrial pressure of 15 mmHg. LEFT VENTRICLE PLAX 2D LVIDd:         5.00 cm Diastology LVIDs:         3.60 cm LV e' medial:    5.22 cm/s LV PW:         0.70 cm LV E/e' medial:  22.2 LV IVS:        0.90 cm LV e' lateral:   10.70 cm/s                        LV E/e' lateral: 10.8  IVC IVC diam: 2.20 cm LEFT ATRIUM         Index LA diam:  4.20 cm 2.57 cm/m  AORTIC VALVE AV Vmax:           252.00 cm/s AV Vmean:          185.000 cm/s AV VTI:            0.571 m AV Peak Grad:      25.4 mmHg AV Mean Grad:      15.0 mmHg LVOT Vmax:         93.30 cm/s LVOT Vmean:        65.800 cm/s LVOT VTI:          0.210 m LVOT/AV VTI ratio: 0.37 AI PHT:            327 msec  AORTA Ao Asc diam: 3.50 cm MITRAL VALVE                TRICUSPID VALVE MV Area (PHT): 3.72 cm     TR Peak grad:   34.8 mmHg MV Decel Time: 204 msec     TR Vmax:        295.00 cm/s MV E velocity: 116.00 cm/s MV A velocity: 96.40 cm/s   SHUNTS MV E/A ratio:  1.20         Systemic VTI: 0.21 m Dorris Carnes MD Electronically signed by Dorris Carnes MD Signature Date/Time: 06/08/2020/6:54:02 PM    Final      Principal Problem:   Sepsis ([place]) Active Problems:   Human immunodeficiency virus (HIV) disease ([place])   Depression, recurrent ([place])   Chronic obstructive pulmonary disease ([place])   HIV disease ([place])   Chronic kidney disease, stage 3b ([place])   Coronary artery disease   Type 2 diabetes mellitus with stage 3b chronic kidney disease, without long-term current use of insulin ([place])   Benign prostatic hyperplasia without lower urinary tract symptoms   Chronic diastolic CHF (congestive heart failure) ([facility])   Calculus of bile duct with acute cholangitis   Pre-op evaluation   Elevated troponin     LOS: 3 days     Carmell Austria, MD 06/10/2020, 11:25 AM [facility] [phone]

## 2020-06-10 NOTE — Plan of Care (Signed)

## 2020-06-10 NOTE — Progress Notes (Signed)
Progress Note  Patient Name: [name] Date of Encounter: 06/10/2020  [facility] Cardiologist: Kirk Ruths, MD   Subjective   No complaits  Inpatient Medications    Scheduled Meds: . acidophilus  1 capsule Oral Q breakfast  . aspirin EC  81 mg Oral Daily  . B-complex with vitamin C  1 tablet Oral Q breakfast  . cholecalciferol  2,000 Units Oral q AM  . dolutegravir  50 mg Oral q AM  . doravirine  100 mg Oral q AM  . insulin aspart  0-15 Units Subcutaneous TID WC  . isosorbide mononitrate  30 mg Oral Daily  . metoprolol tartrate  12.5 mg Oral BID  . pantoprazole  40 mg Oral QAC breakfast  . potassium chloride SA  20 mEq Oral Daily  . sodium bicarbonate  650 mg Oral TID  . tamsulosin  0.4 mg Oral Daily  . valACYclovir  500 mg Oral q AM   Continuous Infusions: . cefTRIAXone (ROCEPHIN)  IV Stopped (06/10/20 0933)   PRN Meds: labetalol, loperamide, nitroGLYCERIN, ondansetron **OR** ondansetron (ZOFRAN) IV   Vital Signs    Vitals:   06/10/20 0400 06/10/20 0600 06/10/20 0751 06/10/20 0900  BP: (!) 168/65 (!) 189/77 (!) 152/76 (!) 160/69  Pulse: 80 84 79 84  Resp: (!) 22 16 17 16   Temp:   98 F (36.7 C)   TempSrc:   Oral   SpO2: 95% 98% 98% 97%  Weight:      Height:        Intake/Output Summary (Last 24 hours) at 06/10/2020 1106 Last data filed at 06/10/2020 1000 Gross per 24 hour  Intake 679.12 ml  Output 2450 ml  Net -1770.88 ml   Last 3 Weights 06/07/2020 06/05/2020 05/27/2020  Weight (lbs) 134 lb 14.7 oz 135 lb 1.6 oz 136 lb 14.5 oz  Weight (kg) 61.2 kg 61.281 kg 62.1 kg      Telemetry    NSR - Personally Reviewed  ECG    n/a - Personally Reviewed  Physical Exam   GEN: No acute distress.   Neck: No JVD Cardiac: RRR, 2/6 systolic murmur apex Respiratory: Clear to auscultation bilaterally. GI: Soft, nontender, non-distended  MS: No edema; No deformity. Neuro:  Nonfocal  Psych: Normal affect   Labs    High Sensitivity Troponin:     Recent Labs  Lab 05/28/20 0222 06/07/20 0436 06/07/20 0803 06/08/20 1534 06/09/20 1250  TROPONINIHS 1,001* 37* 308* 3,561* 1,816*      Chemistry Recent Labs  Lab 06/05/20 1101 06/05/20 1101 06/07/20 0436 06/07/20 0436 06/08/20 0312 06/09/20 0456 06/10/20 0856  NA 140   < > 136   < > 142 138 139  K 4.5   < > 3.6   < > 3.5 4.0 3.9  CL 105   < > 102   < > 113* 111 109  CO2 25   < > 19*   < > 21* 15* 20*  GLUCOSE 276*   < > 239*   < > 113* 206* 137*  BUN 30*   < > 28*   < > 22 18 21   CREATININE 2.10*  --  2.08*   < > 1.81* 1.59* 1.60*  CALCIUM 9.2   < > 8.5*   < > 7.8* 8.4* 8.8*  PROT 6.6  --  7.5  --   --  5.9*  --   ALBUMIN  --   --  3.7  --   --  2.6*  --   AST 56*  --  133*  --   --  82*  --   ALT 69*  --  162*  --   --  97*  --   ALKPHOS  --   --  79  --   --  67  --   BILITOT 1.2  --  6.4*  --   --  3.5*  --   GFRNONAA 31*  --  33*   < > 39* 46* 45*  GFRAA 35*  --   --   --   --   --   --   ANIONGAP  --   --  15   < > 8 12 10    < > = values in this interval not displayed.     Hematology Recent Labs  Lab 06/07/20 0436 06/08/20 0312 06/09/20 0456  WBC 3.6* 5.8 6.4  RBC 4.19* 3.44* 3.77*  HGB 13.6 10.8* 11.8*  HCT 40.7 32.1* 35.0*  MCV 97.1 93.3 92.8  MCH 32.5 31.4 31.3  MCHC 33.4 33.6 33.7  RDW 13.2 13.6 13.5  PLT 128* 121* 144*    BNP Recent Labs  Lab 06/07/20 0436  BNP 162.2*     DDimer No results for input(s): DDIMER in the last 168 hours.   Radiology    ECHOCARDIOGRAM LIMITED  Result Date: 06/08/2020    ECHOCARDIOGRAM LIMITED REPORT   Patient Name:   SNYDER COLAVITO Carmon Date of Exam: 06/08/2020 Medical Rec #:  [id number]    Height:       63.0 in Accession #:    [id number]   Weight:       134.9 lb Date of Birth:  [birth year]   BSA:          1.636 m Patient Age:    31 years     BP:           153/62 mmHg Patient Gender: M            HR:           69 bpm. Exam Location:  Inpatient Procedure: Limited Echo, Limited Color Doppler and Cardiac Doppler  Indications:    cad of native vessel 414.01  History:        Patient has prior history of Echocardiogram examinations, most                 recent 05/28/2020. Prior CABG, chronic kidney disease. sepsis.                 HIV; Risk Factors:Hypertension, Dyslipidemia and Diabetes.  Sonographer:    Johny Chess Referring Phys: [id number] [place]  1. Compared to echo report from 05/28/20, LVEF is improved. . Left ventricular ejection fraction, by estimation, is 60 to 65%. The left ventricle has normal function. The left ventricle has no regional wall motion abnormalities.  2. Right ventricular systolic function is normal. The right ventricular size is normal. There is mildly elevated pulmonary artery systolic pressure.  3. Mild mitral valve regurgitation.  4. TR jet is eccentric, directed anterior in RA.. Tricuspid valve regurgitation is mild to moderate.  5. AV is thickened, calcified with restricted motion . Peak and mean gradients through the valve are 25 and 15 mm Hg respectively This is increasred from echo report of 10/25.. The aortic valve is abnormal. Aortic valve regurgitation is mild.  6. The inferior vena cava is dilated in size with <50% respiratory variability, suggesting right atrial  pressure of 15 mmHg. FINDINGS  Left Ventricle: Compared to echo report from 05/28/20, LVEF is improved. Left ventricular ejection fraction, by estimation, is 60 to 65%. The left ventricle has normal function. The left ventricle has no regional wall motion abnormalities. The left ventricular internal cavity size was normal in size. There is no left ventricular hypertrophy. Right Ventricle: The right ventricular size is normal. Right ventricular systolic function is normal. There is mildly elevated pulmonary artery systolic pressure. The tricuspid regurgitant velocity is 2.95 m/s, and with an assumed right atrial pressure of 5 mmHg, the estimated right ventricular systolic pressure is 23.5 mmHg. Left  Atrium: Left atrial size was normal in size. Right Atrium: Right atrial size was normal in size. Pericardium: There is no evidence of pericardial effusion. Mitral Valve: The mitral valve is abnormal. There is mild thickening of the mitral valve leaflet(s). Mild to moderate mitral annular calcification. Mild mitral valve regurgitation. Tricuspid Valve: TR jet is eccentric, directed anterior in RA. The tricuspid valve is normal in structure. Tricuspid valve regurgitation is mild to moderate. Aortic Valve: AV is thickened, calcified with restricted motion . Peak and mean gradients through the valve are 25 and 15 mm Hg respectively This is increasred from echo report of 10/25. The aortic valve is abnormal. Aortic valve regurgitation is mild. Aortic regurgitation PHT measures 327 msec. Aortic valve mean gradient measures 15.0 mmHg. Aortic valve peak gradient measures 25.4 mmHg. Pulmonic Valve: The pulmonic valve was normal in structure. Pulmonic valve regurgitation is not visualized. Aorta: The aortic root and ascending aorta are structurally normal, with no evidence of dilitation. Venous: The inferior vena cava is dilated in size with less than 50% respiratory variability, suggesting right atrial pressure of 15 mmHg. LEFT VENTRICLE PLAX 2D LVIDd:         5.00 cm Diastology LVIDs:         3.60 cm LV e' medial:    5.22 cm/s LV PW:         0.70 cm LV E/e' medial:  22.2 LV IVS:        0.90 cm LV e' lateral:   10.70 cm/s                        LV E/e' lateral: 10.8  IVC IVC diam: 2.20 cm LEFT ATRIUM         Index LA diam:    4.20 cm 2.57 cm/m  AORTIC VALVE AV Vmax:           252.00 cm/s AV Vmean:          185.000 cm/s AV VTI:            0.571 m AV Peak Grad:      25.4 mmHg AV Mean Grad:      15.0 mmHg LVOT Vmax:         93.30 cm/s LVOT Vmean:        65.800 cm/s LVOT VTI:          0.210 m LVOT/AV VTI ratio: 0.37 AI PHT:            327 msec  AORTA Ao Asc diam: 3.50 cm MITRAL VALVE                TRICUSPID VALVE MV Area (PHT):  3.72 cm     TR Peak grad:   34.8 mmHg MV Decel Time: 204 msec     TR Vmax:  295.00 cm/s MV E velocity: 116.00 cm/s MV A velocity: 96.40 cm/s   SHUNTS MV E/A ratio:  1.20         Systemic VTI: 0.21 m Dorris Carnes MD Electronically signed by Dorris Carnes MD Signature Date/Time: 06/08/2020/6:54:02 PM    Final     Cardiac Studies     Patient Profile     74 y.o.malewith a hx of HIV, CAD status post CABG with multiple PCI,most recent PTCA to distal SVG-for in-stent restenosis(10/25),diabetes, hyperlipidemia, COPD, mild aortic stenosiswho is being seen today for the evaluation of preoperative evaluationat the request of Dr. Francine Graven.  Assessment & Plan    1. CAD - history of prior CABG, multiple PCIs - most recently 10/25/21cath: patent LIMA to LAD, patent SVG-diag 90% ISR, patent SVG-ramus with CTO of jump portion to OM1. CTO SVG-RPDA/RPL. Had PTCA to distal SVG to diag - 05/28/20 echo LVEF 40-45%, inferior/inferolateral hypokinesis  - admitted 11/4 with sepsis, presumed cholangitis with epigastric and RUQ pain - a  troponin was checked and found to be elevated. Peak 3561, now trending down. EKG inferior Qwaves, LVH with chronic strain pattern.  - repeat echo shows that LVEF has normalized, prior inferior/inferolateral WMA has resolved  - suspect demand ischemia in setting of chronic obstructive CAD combined with severe sepsis. He has had no cardiopulmonary symptoms and his echo shows his LVEF has normalized  - medical therapy with ASA 81, imdur 30, lopressor 12.5mg  bid, statin on hold due to elevated LFTs. Brillinta on hold for ERCP, ok to hold as most recent intervention was PTCA as opposed to stenting. Resume when ok from procedure standpoint.     2. Choledocholithiasis with obstructive jaundice - plan is for ERCP with stone extraction after brillinta wash out - possible lap chole to follow   3. Ecoli bacteremia/Severe sepsis - Presented with fever, tachycardia, lactic  acidosis, AKI, elevated white cell counts.Elevated procalcitonin. - abx per primary team   No contraindications from cardiac standpoint for any neccesary GI or surgical procedures. Would continue ASA throughout process, restart brillinta when able from surgical standpoint. Restart statin when able from GI standpoint.   We will sign off inpatient care.    For questions or updates, please contact [place] Please consult [email or web address] for contact info under        Signed, Carlyle Dolly, MD  06/10/2020, 11:06 AM

## 2020-06-10 NOTE — Progress Notes (Signed)
Progress Note    ASSESSMENT AND PLAN:   Choledocholithiasiswithobs jaundice.E. coli bacteremia on Rocephin s/omild asc cholangitis. LFTs trending down. TB 3.5 from 6.4  CAD s/p CABGwith multiple PCI,most recent10/25when he presented with NSTEMI. On Brilinta (last dose 11/3).  Increase in troponind/t demand ischemia.  Getting better.  Appreciate cardiology input.  HIV   Plan: -ERCP in a.m. with ES/stone extraction with Dr. Henrene Pastor.  Brilinta on hold.  He would need laparoscopic cholecystectomy thereafter. -IV Rocephin to continue -Continue baby ASA -Recheck labs in AM.     SUBJECTIVE   Doing very well.  No abdominal pain.  No fever or chills.    OBJECTIVE:     Vital signs in last 24 hours: Temp:  [98 F (36.7 C)-98.6 F (37 C)] 98 F (36.7 C) (11/07 0751) Pulse Rate:  [68-95] 84 (11/07 0900) Resp:  [13-23] 16 (11/07 0900) BP: (143-189)/(64-77) 160/69 (11/07 0900) SpO2:  [95 %-98 %] 97 % (11/07 0900) Last BM Date: 06/07/20 General:   Alert, well-developed male in NAD EENT:  Normal hearing, non icteric sclera, conjunctive pink.  Heart:  Regular rate and rhythm; no murmur.  No lower extremity edema   Pulm: Normal respiratory effort, lungs CTA bilaterally without wheezes or crackles. Abdomen:  Soft, nondistended, nontender.  Normal bowel sounds,.       Neurologic:  Alert and  oriented x4;  grossly normal neurologically. Psych:  Pleasant, cooperative.  Normal mood and affect.   Intake/Output from previous day: 11/06 0701 - 11/07 0700 In: 2163 [P.O.:480; I.V.:1483.9; IV Piggyback:199.1] Out: 2500 [Urine:2500] Intake/Output this shift: Total I/O In: 340.1 [P.O.:240; IV Piggyback:100.1] Out: 600 [Urine:600]  Lab Results: Recent Labs    06/08/20 0312 06/09/20 0456  WBC 5.8 6.4  HGB 10.8* 11.8*  HCT 32.1* 35.0*  PLT 121* 144*   BMET Recent Labs    06/08/20 0312 06/09/20 0456 06/10/20 0856  NA 142 138 139  K 3.5 4.0 3.9  CL  113* 111 109  CO2 21* 15* 20*  GLUCOSE 113* 206* 137*  BUN 22 18 21   CREATININE 1.81* 1.59* 1.60*  CALCIUM 7.8* 8.4* 8.8*   LFT Recent Labs    06/09/20 0456  PROT 5.9*  ALBUMIN 2.6*  AST 82*  ALT 97*  ALKPHOS 67  BILITOT 3.5*   PT/INR Recent Labs    06/08/20 0312  LABPROT 16.1*  INR 1.3*   Hepatitis Panel No results for input(s): HEPBSAG, HCVAB, HEPAIGM, HEPBIGM in the last 72 hours.  ECHOCARDIOGRAM LIMITED  Result Date: 06/08/2020    ECHOCARDIOGRAM LIMITED REPORT   Patient Name:   [name] Date of Exam: 06/08/2020 Medical Rec #:  [id number]    Height:       63.0 in Accession #:    [id number]   Weight:       134.9 lb Date of Birth:  October 31, 1945   BSA:          1.636 m Patient Age:    74 years     BP:           153/62 mmHg Patient Gender: M            HR:           69 bpm. Exam Location:  Inpatient Procedure: Limited Echo, Limited Color Doppler and Cardiac Doppler Indications:    cad of native vessel 414.01  History:        Patient has prior history of Echocardiogram examinations, most  recent 05/28/2020. Prior CABG, chronic kidney disease. sepsis.                 HIV; Risk Factors:Hypertension, Dyslipidemia and Diabetes.  Sonographer:    Johny Chess Referring Phys: [id number] [place]  1. Compared to echo report from 05/28/20, LVEF is improved. . Left ventricular ejection fraction, by estimation, is 60 to 65%. The left ventricle has normal function. The left ventricle has no regional wall motion abnormalities.  2. Right ventricular systolic function is normal. The right ventricular size is normal. There is mildly elevated pulmonary artery systolic pressure.  3. Mild mitral valve regurgitation.  4. TR jet is eccentric, directed anterior in RA.. Tricuspid valve regurgitation is mild to moderate.  5. AV is thickened, calcified with restricted motion . Peak and mean gradients through the valve are 25 and 15 mm Hg respectively This is increasred  from echo report of 10/25.. The aortic valve is abnormal. Aortic valve regurgitation is mild.  6. The inferior vena cava is dilated in size with <50% respiratory variability, suggesting right atrial pressure of 15 mmHg. FINDINGS  Left Ventricle: Compared to echo report from 05/28/20, LVEF is improved. Left ventricular ejection fraction, by estimation, is 60 to 65%. The left ventricle has normal function. The left ventricle has no regional wall motion abnormalities. The left ventricular internal cavity size was normal in size. There is no left ventricular hypertrophy. Right Ventricle: The right ventricular size is normal. Right ventricular systolic function is normal. There is mildly elevated pulmonary artery systolic pressure. The tricuspid regurgitant velocity is 2.95 m/s, and with an assumed right atrial pressure of 5 mmHg, the estimated right ventricular systolic pressure is 13.0 mmHg. Left Atrium: Left atrial size was normal in size. Right Atrium: Right atrial size was normal in size. Pericardium: There is no evidence of pericardial effusion. Mitral Valve: The mitral valve is abnormal. There is mild thickening of the mitral valve leaflet(s). Mild to moderate mitral annular calcification. Mild mitral valve regurgitation. Tricuspid Valve: TR jet is eccentric, directed anterior in RA. The tricuspid valve is normal in structure. Tricuspid valve regurgitation is mild to moderate. Aortic Valve: AV is thickened, calcified with restricted motion . Peak and mean gradients through the valve are 25 and 15 mm Hg respectively This is increasred from echo report of 10/25. The aortic valve is abnormal. Aortic valve regurgitation is mild. Aortic regurgitation PHT measures 327 msec. Aortic valve mean gradient measures 15.0 mmHg. Aortic valve peak gradient measures 25.4 mmHg. Pulmonic Valve: The pulmonic valve was normal in structure. Pulmonic valve regurgitation is not visualized. Aorta: The aortic root and ascending aorta are  structurally normal, with no evidence of dilitation. Venous: The inferior vena cava is dilated in size with less than 50% respiratory variability, suggesting right atrial pressure of 15 mmHg. LEFT VENTRICLE PLAX 2D LVIDd:         5.00 cm Diastology LVIDs:         3.60 cm LV e' medial:    5.22 cm/s LV PW:         0.70 cm LV E/e' medial:  22.2 LV IVS:        0.90 cm LV e' lateral:   10.70 cm/s                        LV E/e' lateral: 10.8  IVC IVC diam: 2.20 cm LEFT ATRIUM         Index LA diam:  4.20 cm 2.57 cm/m  AORTIC VALVE AV Vmax:           252.00 cm/s AV Vmean:          185.000 cm/s AV VTI:            0.571 m AV Peak Grad:      25.4 mmHg AV Mean Grad:      15.0 mmHg LVOT Vmax:         93.30 cm/s LVOT Vmean:        65.800 cm/s LVOT VTI:          0.210 m LVOT/AV VTI ratio: 0.37 AI PHT:            327 msec  AORTA Ao Asc diam: 3.50 cm MITRAL VALVE                TRICUSPID VALVE MV Area (PHT): 3.72 cm     TR Peak grad:   34.8 mmHg MV Decel Time: 204 msec     TR Vmax:        295.00 cm/s MV E velocity: 116.00 cm/s MV A velocity: 96.40 cm/s   SHUNTS MV E/A ratio:  1.20         Systemic VTI: 0.21 m Dorris Carnes MD Electronically signed by Dorris Carnes MD Signature Date/Time: 06/08/2020/6:54:02 PM    Final      Principal Problem:   Sepsis ([place]) Active Problems:   Human immunodeficiency virus (HIV) disease ([place])   Depression, recurrent ([place])   Chronic obstructive pulmonary disease ([place])   HIV disease ([place])   Chronic kidney disease, stage 3b ([place])   Coronary artery disease   Type 2 diabetes mellitus with stage 3b chronic kidney disease, without long-term current use of insulin ([place])   Benign prostatic hyperplasia without lower urinary tract symptoms   Chronic diastolic CHF (congestive heart failure) ([facility])   Calculus of bile duct with acute cholangitis   Pre-op evaluation   Elevated troponin     LOS: 3 days     Carmell Austria, MD 06/10/2020, 11:25 AM [facility] [phone]

## 2020-06-11 ENCOUNTER — Encounter (HOSPITAL_COMMUNITY): Payer: Self-pay | Admitting: Internal Medicine

## 2020-06-11 ENCOUNTER — Inpatient Hospital Stay (HOSPITAL_COMMUNITY): Payer: Medicare Other

## 2020-06-11 ENCOUNTER — Other Ambulatory Visit: Payer: Self-pay | Admitting: *Deleted

## 2020-06-11 ENCOUNTER — Encounter (HOSPITAL_COMMUNITY)
Admission: EM | Disposition: A | Discharge status: Home / Self Care | Payer: Self-pay | Source: Home / Self Care | Attending: Internal Medicine

## 2020-06-11 ENCOUNTER — Inpatient Hospital Stay (HOSPITAL_COMMUNITY): Payer: Medicare Other | Admitting: Anesthesiology

## 2020-06-11 DIAGNOSIS — R1013 Epigastric pain: Secondary | ICD-10-CM

## 2020-06-11 DIAGNOSIS — K838 Other specified diseases of biliary tract: Secondary | ICD-10-CM | POA: Diagnosis not present

## 2020-06-11 DIAGNOSIS — K8050 Calculus of bile duct without cholangitis or cholecystitis without obstruction: Secondary | ICD-10-CM | POA: Diagnosis not present

## 2020-06-11 DIAGNOSIS — R935 Abnormal findings on diagnostic imaging of other abdominal regions, including retroperitoneum: Secondary | ICD-10-CM | POA: Insufficient documentation

## 2020-06-11 HISTORY — PX: ENDOSCOPIC RETROGRADE CHOLANGIOPANCREATOGRAPHY (ERCP) WITH PROPOFOL: SHX5810

## 2020-06-11 HISTORY — PX: PANCREATIC STENT PLACEMENT: SHX5539

## 2020-06-11 LAB — COMPREHENSIVE METABOLIC PANEL
ALT: 101 U/L — ABNORMAL HIGH (ref 0–44)
AST: 72 U/L — ABNORMAL HIGH (ref 15–41)
Albumin: 2.4 g/dL — ABNORMAL LOW (ref 3.5–5.0)
Alkaline Phosphatase: 73 U/L (ref 38–126)
Anion gap: 10 (ref 5–15)
BUN: 22 mg/dL (ref 8–23)
CO2: 21 mmol/L — ABNORMAL LOW (ref 22–32)
Calcium: 8.5 mg/dL — ABNORMAL LOW (ref 8.9–10.3)
Chloride: 107 mmol/L (ref 98–111)
Creatinine, Ser: 1.61 mg/dL — ABNORMAL HIGH (ref 0.61–1.24)
GFR, Estimated: 45 mL/min — ABNORMAL LOW (ref >60)
Glucose, Bld: 163 mg/dL — ABNORMAL HIGH (ref 70–99)
Potassium: 3.9 mmol/L (ref 3.5–5.1)
Sodium: 138 mmol/L (ref 135–145)
Total Bilirubin: 1.1 mg/dL (ref 0.3–1.2)
Total Protein: 5.8 g/dL — ABNORMAL LOW (ref 6.5–8.1)

## 2020-06-11 LAB — CBC
HCT: 34 % — ABNORMAL LOW (ref 39.0–52.0)
Hemoglobin: 11.5 g/dL — ABNORMAL LOW (ref 13.0–17.0)
MCH: 31.5 pg (ref 26.0–34.0)
MCHC: 33.8 g/dL (ref 30.0–36.0)
MCV: 93.2 fL (ref 80.0–100.0)
Platelets: 164 10*3/uL (ref 150–400)
RBC: 3.65 MIL/uL — ABNORMAL LOW (ref 4.22–5.81)
RDW: 13.6 % (ref 11.5–15.5)
WBC: 5.5 10*3/uL (ref 4.0–10.5)
nRBC: 0 % (ref 0.0–0.2)

## 2020-06-11 LAB — PROTIME-INR
INR: 1.2 (ref 0.8–1.2)
Prothrombin Time: 14.6 seconds (ref 11.4–15.2)

## 2020-06-11 LAB — GLUCOSE, CAPILLARY
Glucose-Capillary: 170 mg/dL — ABNORMAL HIGH (ref 70–99)
Glucose-Capillary: 255 mg/dL — ABNORMAL HIGH (ref 70–99)
Glucose-Capillary: 262 mg/dL — ABNORMAL HIGH (ref 70–99)
Glucose-Capillary: 185 mg/dL — ABNORMAL HIGH (ref 70–99)
Glucose-Capillary: 226 mg/dL — ABNORMAL HIGH (ref 70–99)

## 2020-06-11 SURGERY — ENDOSCOPIC RETROGRADE CHOLANGIOPANCREATOGRAPHY (ERCP) WITH PROPOFOL
Anesthesia: General

## 2020-06-11 MED ORDER — INDOMETHACIN 50 MG RE SUPP
RECTAL | Status: DC | PRN
  Administered 2020-06-11: 100 mg via RECTAL

## 2020-06-11 MED ORDER — DEXAMETHASONE SODIUM PHOSPHATE 10 MG/ML IJ SOLN
INTRAMUSCULAR | Status: DC | PRN
  Administered 2020-06-11: 5 mg via INTRAVENOUS

## 2020-06-11 MED ORDER — ONDANSETRON HCL 4 MG/2ML IJ SOLN
INTRAMUSCULAR | Status: DC | PRN
  Administered 2020-06-11: 4 mg via INTRAVENOUS

## 2020-06-11 MED ORDER — LACTATED RINGERS IV SOLN: INTRAVENOUS | Status: DC | PRN

## 2020-06-11 MED ORDER — MIDAZOLAM HCL 2 MG/2ML IJ SOLN
INTRAMUSCULAR | Status: DC | PRN
  Administered 2020-06-11: 2 mg via INTRAVENOUS

## 2020-06-11 MED ORDER — LIDOCAINE 2% (20 MG/ML) 5 ML SYRINGE
INTRAMUSCULAR | Status: DC | PRN
  Administered 2020-06-11: 40 mg via INTRAVENOUS

## 2020-06-11 MED ORDER — INDOMETHACIN 50 MG RE SUPP: 100.0000 mg | Freq: Once | RECTAL | Status: DC

## 2020-06-11 MED ORDER — SUGAMMADEX SODIUM 200 MG/2ML IV SOLN
INTRAVENOUS | Status: DC | PRN
  Administered 2020-06-11: 100 mg via INTRAVENOUS
  Administered 2020-06-11 (×2): 50 mg via INTRAVENOUS

## 2020-06-11 MED ORDER — GLUCAGON HCL RDNA (DIAGNOSTIC) 1 MG IJ SOLR
INTRAMUSCULAR | Status: AC
  Filled 2020-06-11: qty 1

## 2020-06-11 MED ORDER — INDOMETHACIN 50 MG RE SUPP
RECTAL | Status: AC
  Filled 2020-06-11: qty 2

## 2020-06-11 MED ORDER — PHENYLEPHRINE HCL-NACL 10-0.9 MG/250ML-% IV SOLN
INTRAVENOUS | Status: DC | PRN
  Administered 2020-06-11: 25 ug/min via INTRAVENOUS

## 2020-06-11 MED ORDER — GLUCAGON HCL RDNA (DIAGNOSTIC) 1 MG IJ SOLR
INTRAMUSCULAR | Status: DC | PRN
  Administered 2020-06-11: .5 mg via INTRAVENOUS

## 2020-06-11 MED ORDER — PHENYLEPHRINE 40 MCG/ML (10ML) SYRINGE FOR IV PUSH (FOR BLOOD PRESSURE SUPPORT)
PREFILLED_SYRINGE | INTRAVENOUS | Status: DC | PRN
  Administered 2020-06-11: 120 ug via INTRAVENOUS
  Administered 2020-06-11: 80 ug via INTRAVENOUS
  Administered 2020-06-11 (×3): 120 ug via INTRAVENOUS
  Administered 2020-06-11: 160 ug via INTRAVENOUS

## 2020-06-11 MED ORDER — ICOSAPENT ETHYL 1 G PO CAPS: 2.0000 g | ORAL_CAPSULE | Freq: Two times a day (BID) | ORAL | 3 refills | Status: AC

## 2020-06-11 MED ORDER — SODIUM CHLORIDE 0.9 % IV SOLN
INTRAVENOUS | Status: DC | PRN
  Administered 2020-06-11: 20 mL

## 2020-06-11 MED ORDER — FENTANYL CITRATE (PF) 250 MCG/5ML IJ SOLN
INTRAMUSCULAR | Status: DC | PRN
  Administered 2020-06-11 (×2): 50 ug via INTRAVENOUS

## 2020-06-11 MED ORDER — ROCURONIUM BROMIDE 10 MG/ML (PF) SYRINGE
PREFILLED_SYRINGE | INTRAVENOUS | Status: DC | PRN
  Administered 2020-06-11: 60 mg via INTRAVENOUS

## 2020-06-11 MED ORDER — PROPOFOL 10 MG/ML IV BOLUS
INTRAVENOUS | Status: DC | PRN
  Administered 2020-06-11 (×2): 40 mg via INTRAVENOUS
  Administered 2020-06-11: 100 mg via INTRAVENOUS

## 2020-06-11 NOTE — Anesthesia Procedure Notes (Signed)
Procedure Name: Intubation Date/Time: 06/11/2020 8:33 AM Performed by: Rande Brunt, CRNA Pre-anesthesia Checklist: Patient identified, Emergency Drugs available, Suction available and Patient being monitored Patient Re-evaluated:Patient Re-evaluated prior to induction Oxygen Delivery Method: Circle System Utilized Preoxygenation: Pre-oxygenation with 100% oxygen Induction Type: IV induction Ventilation: Mask ventilation without difficulty Laryngoscope Size: Mac and 3 Grade View: Grade I Tube type: Oral Tube size: 7.5 mm Number of attempts: 1 Airway Equipment and Method: Stylet Placement Confirmation: ETT inserted through vocal cords under direct vision,  positive ETCO2 and breath sounds checked- equal and bilateral Secured at: 22 cm Tube secured with: Tape Dental Injury: Teeth and Oropharynx as per pre-operative assessment

## 2020-06-11 NOTE — Interval H&P Note (Signed)
History and Physical Interval Note:  06/11/2020 8:29 AM  [name]  has presented today for surgery, with the diagnosis of choledocholithiasis..  The various methods of treatment have been discussed with the patient and family. After consideration of risks, benefits and other options for treatment, the patient has consented to  Procedure(s): ENDOSCOPIC RETROGRADE CHOLANGIOPANCREATOGRAPHY (ERCP) WITH PROPOFOL (N/A) as a surgical intervention.  The patient's history has been reviewed, patient examined, no change in status, stable for surgery.  I have reviewed the patient's chart and labs.  Questions were answered to the patient's satisfaction.  Case reviewed.  I saw the patient preprocedure in the endoscopy suite at the bedside.  Looks well with benign abdomen.  Has had Brilinta washout.  Now for ERCP with sphincterotomy and common duct stone extraction.The nature of the procedure, as well as the risks, benefits, and alternatives were carefully and thoroughly reviewed with the patient. Ample time for discussion and questions allowed. The patient understood, was satisfied, and agreed to proceed.    [name]

## 2020-06-11 NOTE — Anesthesia Postprocedure Evaluation (Signed)
Anesthesia Post Note  Patient: [name]  Procedure(s) Performed: ENDOSCOPIC RETROGRADE CHOLANGIOPANCREATOGRAPHY (ERCP) WITH PROPOFOL (N/A ) PANCREATIC STENT PLACEMENT     Patient location during evaluation: Endoscopy Anesthesia Type: General Level of consciousness: awake and alert Pain management: pain level controlled Vital Signs Assessment: post-procedure vital signs reviewed and stable Respiratory status: spontaneous breathing, nonlabored ventilation, respiratory function stable and patient connected to nasal cannula oxygen Cardiovascular status: blood pressure returned to baseline and stable Postop Assessment: no apparent nausea or vomiting Anesthetic complications: no   No complications documented.  Last Vitals:  Vitals:   06/11/20 1226 06/11/20 1227  BP:  (!) 174/83  Pulse:  61  Resp:  13  Temp: 36.9 C 36.9 C  SpO2:  98%    Last Pain:  Vitals:   06/11/20 1227  TempSrc: Oral  PainSc:                  Sanela Evola L Lucero Auzenne

## 2020-06-11 NOTE — Transfer of Care (Signed)
Immediate Anesthesia Transfer of Care Note  Patient: MAISON KESTENBAUM  Procedure(s) Performed: ENDOSCOPIC RETROGRADE CHOLANGIOPANCREATOGRAPHY (ERCP) WITH PROPOFOL (N/A ) PANCREATIC STENT PLACEMENT  Patient Location: Endoscopy unit  Anesthesia Type:General  Level of Consciousness: awake, drowsy and patient cooperative  Airway & Oxygen Therapy: Patient Spontanous Breathing and Patient connected to face mask oxygen  Post-op Assessment: Report given to RN, Post -op Vital signs reviewed and stable and Patient moving all extremities X 4  Post vital signs: Reviewed and stable  Last Vitals:  Vitals Value Taken Time  BP 125/78 06/11/20 1109  Temp    Pulse 88 06/11/20 1110  Resp 14 06/11/20 1110  SpO2 97 % 06/11/20 1110  Vitals shown include unvalidated device data.  Last Pain:  Vitals:   06/11/20 1109  TempSrc:   PainSc: 0-No pain      Patients Stated Pain Goal: 0 (15/17/61 6073)  Complications: No complications documented.

## 2020-06-11 NOTE — Progress Notes (Signed)
PROGRESS NOTE    [name]  PTW:[id number] DOB: [birth year] DOA: 06/07/2020 PCP: Dorothyann Peng, NP   Chief Complain: Abdominal pain  Brief Narrative:   Patient is a 74 year old male with history of coronary disease status post recent left heart cath with successful PTCA currently taking aspirin Brilinta, diabetes type 2, status 3B CKD, hypertension, HIV, chronic diastolic congestive heart failure, BPH who presents to the emergency department with complaints of abdominal pain mainly in the epigastric region and right upper quadrant.  In the emergency department, he also had fever and was tachycardic.  Patient was found to have elevated liver enzymes on presentation, elevated bilirubin, elevated lactate, elevated troponin.  CT abdomen/pelvis showed cholelithiasis, multiple stones in the distal CBD resulting in mild CBD dilation.  Procalcitonin was elevated.  Patient was admitted for the management of possible sepsis secondary to possible cholangitis.  GI ,general surgery ,cardiology following.  Unsuccessful ERCP today but stent placed in pancreatic duct.  Plan to repeat ERCP in the near future.  Assessment & Plan:   Principal Problem:   Sepsis ([place]) Active Problems:   Human immunodeficiency virus (HIV) disease (HCC)   Depression, recurrent ([place])   Chronic obstructive pulmonary disease ([place])   HIV disease ([place])   Chronic kidney disease, stage 3b ([place])   Coronary artery disease   Type 2 diabetes mellitus with stage 3b chronic kidney disease, without long-term current use of insulin ([place])   Benign prostatic hyperplasia without lower urinary tract symptoms   Chronic diastolic CHF (congestive heart failure) (HCC)   Calculus of bile duct with acute cholangitis   Pre-op evaluation   Transaminitis   Epigastric abdominal pain   Abnormal CT of the abdomen   Severe sepsis secondary to possible acute cholangitis: Presented with fever, tachycardia, lactic acidosis, AKI, elevated white cell  counts.  Elevated procalcitonin. continue current antibiotic.  Sepsis physiology has resolved.  Gram-negative bacteremia: Blood cultures showed pansenstive E. coli.  Continue current antibiotics.  Will change antibiotics to oral when appropriate.  Choledocholithiasis/suspected cholangitis: Presented with abdominal pain mainly in the epigastric and right upper quadrant.  CT finding as above.  Right upper quadrant ultrasound showed tiny stones and sludge noted layering within the gallbladder,no secondary signs of acute cholecystitis,mild increase caliber of the common bile duct which measures up,no signs of choledocholithiasis.  Elevated liver enzymes and bilirubin.  Since he presented with sepsis-like picture along with above findings, cholangitis was suspected.Improving liver enzymes, lipase level. Underwent ERCP but unsuccessful biliary cannulation , stent placed in pancreatic duct.  Plan to repeat ERCP in the near future. General surgery also planning for cholecystectomy after ERCP.  AKI on CKD stage IIIb: Presented with creatinine in the range of 2.  Baseline creatinine ranges from 1.5-1.8.  Kidney function improved with IV fluids.  Diarrhea: Reported 4-5 episodes of diarrhea earlier but has resolved  now.    Continue Imodium as needed.  Denies any abdominal pain  Coronary artery disease status post CABG: History of severe coronary artery disease.  Underwent PTCA on October this year and currently on aspirin and Brilinta.  Noted elevated troponin.  Present denies any chest pain.  Elevated troponin could be from supply demand ischemia from sepsis.  Continue nitrates, beta-blockers.  Statin on hold due to elevated liver enzymes. Cardiology following and recommended to restart Brilinta when there is no further plan for operative intervention.  Diastolic congestive heart failure: lasix on hold.  On gentle IV fluids due to concern for sepsis, dehydration and AKI.  Continue current medications.Echo done  on this admission showed ejection fraction of 60 to 65%, no wall motion abnormality.  HIV: Continue HIV therapy  Hypertension:  Continue current regimen.  Monitor blood pressure             DVT prophylaxis:SCD Code Status: Full Family Communication: called daughter on phone on 06/10/20,call not received Status is: Inpatient  Remains inpatient appropriate because:Inpatient level of care appropriate due to severity of illness   Dispo: The patient is from: Home              Anticipated d/c is to: Home              Anticipated d/c date is: 3 days              Patient currently is not medically stable to d/c. Plan for repeat  ERCP followed by cholecystectomy    Consultants: GI,cardiology  Procedures:None  Antimicrobials:  Anti-infectives (From admission, onward)   Start     Dose/Rate Route Frequency Ordered Stop   06/08/20 0800  cefTRIAXone (ROCEPHIN) 2 g in sodium chloride 0.9 % 100 mL IVPB        2 g 200 mL/hr over 30 Minutes Intravenous Every 24 hours 06/07/20 1102     06/07/20 1430  doravirine (PIFELTRO) tablet 100 mg       Note to Pharmacy: OP ZHG:DJME 1 TABLET(100 MG) BY MOUTH DAILY with Tivicay Patient taking differently: Take 100 mg by mouth in the morning (with Tivicay)     100 mg Oral Every morning 06/07/20 1102     06/07/20 1345  dolutegravir (TIVICAY) tablet 50 mg        50 mg Oral Every morning 06/07/20 1102     06/07/20 1200  metroNIDAZOLE (FLAGYL) IVPB 500 mg  Status:  Discontinued        500 mg 100 mL/hr over 60 Minutes Intravenous Every 8 hours 06/07/20 1102 06/07/20 2318   06/07/20 1115  valACYclovir (VALTREX) tablet 500 mg        500 mg Oral Every morning 06/07/20 1102     06/07/20 0715  piperacillin-tazobactam (ZOSYN) IVPB 3.375 g        3.375 g 100 mL/hr over 30 Minutes Intravenous  Once 06/07/20 0703 06/07/20 0831   06/07/20 0630  cefTRIAXone (ROCEPHIN) 1 g in sodium chloride 0.9 % 100 mL IVPB        1 g 200 mL/hr over 30 Minutes Intravenous   Once 06/07/20 0618 06/07/20 0705      Subjective:  Patient seen and examined the bedside this afternoon.  Denies any abdominal pain but complains of some nausea.  Objective: Vitals:   06/11/20 1119 06/11/20 1129 06/11/20 1226 06/11/20 1227  BP: (!) 98/52 (!) 116/43  (!) 174/83  Pulse: 83 77  61  Resp: 15 17  13   Temp:   98.4 F (36.9 C) 98.4 F (36.9 C)  TempSrc:   Oral Oral  SpO2: 97% 96%  98%  Weight:      Height:        Intake/Output Summary (Last 24 hours) at 06/11/2020 1514 Last data filed at 06/11/2020 1049 Gross per 24 hour  Intake 500 ml  Output 1050 ml  Net -550 ml   Filed Weights   06/07/20 1944  Weight: 61.2 kg    Examination:  General exam: Appears calm and comfortable ,Not in distress,average built HEENT:PERRL,Oral mucosa moist, Ear/Nose normal on gross exam Respiratory system: Bilateral equal air entry, normal  vesicular breath sounds, no wheezes or crackles  Cardiovascular system: S1 & S2 heard, RRR. No JVD, murmurs, rubs, gallops or clicks. Gastrointestinal system: Abdomen is distended, soft and nontender. No organomegaly or masses felt. Normal bowel sounds heard. Central nervous system: Alert and oriented. No focal neurological deficits. Extremities: No edema, no clubbing ,no cyanosis, distal peripheral pulses palpable. Skin: No rashes, lesions or ulcers,no icterus ,no pallor     Data Reviewed: I have personally reviewed following labs and imaging studies  CBC: Recent Labs  Lab 06/07/20 0436 06/08/20 0312 06/09/20 0456 06/11/20 0217  WBC 3.6* 5.8 6.4 5.5  NEUTROABS 3.4  --  5.3  --   HGB 13.6 10.8* 11.8* 11.5*  HCT 40.7 32.1* 35.0* 34.0*  MCV 97.1 93.3 92.8 93.2  PLT 128* 121* 144* 161   Basic Metabolic Panel: Recent Labs  Lab 06/07/20 0436 06/08/20 0312 06/09/20 0456 06/10/20 0856 06/11/20 0217  NA 136 142 138 139 138  K 3.6 3.5 4.0 3.9 3.9  CL 102 113* 111 109 107  CO2 19* 21* 15* 20* 21*  GLUCOSE 239* 113* 206* 137* 163*    BUN 28* 22 18 21 22   CREATININE 2.08* 1.81* 1.59* 1.60* 1.61*  CALCIUM 8.5* 7.8* 8.4* 8.8* 8.5*   GFR: Estimated Creatinine Clearance: 33.4 mL/min (A) (by C-G formula based on SCr of 1.61 mg/dL (H)). Liver Function Tests: Recent Labs  Lab 06/05/20 1101 06/07/20 0436 06/09/20 0456 06/11/20 0217  AST 56* 133* 82* 72*  ALT 69* 162* 97* 101*  ALKPHOS  --  79 67 73  BILITOT 1.2 6.4* 3.5* 1.1  PROT 6.6 7.5 5.9* 5.8*  ALBUMIN  --  3.7 2.6* 2.4*   Recent Labs  Lab 06/07/20 0436 06/09/20 0456  LIPASE 66* 88*   No results for input(s): AMMONIA in the last 168 hours. Coagulation Profile: Recent Labs  Lab 06/07/20 0552 06/08/20 0312 06/11/20 0217  INR 1.4* 1.3* 1.2   Cardiac Enzymes: No results for input(s): CKTOTAL, CKMB, CKMBINDEX, TROPONINI in the last 168 hours. BNP (last 3 results) No results for input(s): PROBNP in the last 8760 hours. HbA1C: No results for input(s): HGBA1C in the last 72 hours. CBG: Recent Labs  Lab 06/10/20 1151 06/10/20 1652 06/10/20 2145 06/11/20 1111 06/11/20 1218  GLUCAP 208* 130* 240* 255* 262*   Lipid Profile: No results for input(s): CHOL, HDL, LDLCALC, TRIG, CHOLHDL, LDLDIRECT in the last 72 hours. Thyroid Function Tests: No results for input(s): TSH, T4TOTAL, FREET4, T3FREE, THYROIDAB in the last 72 hours. Anemia Panel: No results for input(s): VITAMINB12, FOLATE, FERRITIN, TIBC, IRON, RETICCTPCT in the last 72 hours. Sepsis Labs: Recent Labs  Lab 06/07/20 0552 06/07/20 0803 06/08/20 0312  PROCALCITON  --   --  60.40  LATICACIDVEN 1.5 1.0  --     Recent Results (from the past 240 hour(s))  Respiratory Panel by RT PCR (Flu A&B, Covid) - Nasopharyngeal Swab     Status: None   Collection Time: 06/07/20  5:51 AM   Specimen: Nasopharyngeal Swab  Result Value Ref Range Status   SARS Coronavirus 2 by RT PCR NEGATIVE NEGATIVE Final    Comment: (NOTE) SARS-CoV-2 target nucleic acids are NOT DETECTED.  The SARS-CoV-2 RNA is  generally detectable in upper respiratoy specimens during the acute phase of infection. The lowest concentration of SARS-CoV-2 viral copies this assay can detect is 131 copies/mL. A negative result does not preclude SARS-Cov-2 infection and should not be used as the sole basis for treatment or other  patient management decisions. A negative result may occur with  improper specimen collection/handling, submission of specimen other than nasopharyngeal swab, presence of viral mutation(s) within the areas targeted by this assay, and inadequate number of viral copies (<131 copies/mL). A negative result must be combined with clinical observations, patient history, and epidemiological information. The expected result is Negative.  Fact Sheet for Patients:  PinkCheek.be  Fact Sheet for Healthcare Providers:  GravelBags.it  This test is no t yet approved or cleared by the [place] FDA and  has been authorized for detection and/or diagnosis of SARS-CoV-2 by FDA under an Emergency Use Authorization (EUA). This EUA will remain  in effect (meaning this test can be used) for the duration of the COVID-19 declaration under Section 564(b)(1) of the Act, 21 U.S.C. section 360bbb-3(b)(1), unless the authorization is terminated or revoked sooner.     Influenza A by PCR NEGATIVE NEGATIVE Final   Influenza B by PCR NEGATIVE NEGATIVE Final    Comment: (NOTE) The Xpert Xpress SARS-CoV-2/FLU/RSV assay is intended as an aid in  the diagnosis of influenza from Nasopharyngeal swab specimens and  should not be used as a sole basis for treatment. Nasal washings and  aspirates are unacceptable for Xpert Xpress SARS-CoV-2/FLU/RSV  testing.  Fact Sheet for Patients: PinkCheek.be  Fact Sheet for Healthcare Providers: GravelBags.it  This test is not yet approved or cleared by the [place] FDA and  has been authorized for detection and/or diagnosis of SARS-CoV-2 by  FDA under an Emergency Use Authorization (EUA). This EUA will remain  in effect (meaning this test can be used) for the duration of the  Covid-19 declaration under Section 564(b)(1) of the Act, 21  U.S.C. section 360bbb-3(b)(1), unless the authorization is  terminated or revoked. Performed at [facility], [place] [address], [place], [place] [address]   Blood culture (routine single)     Status: Abnormal   Collection Time: 06/07/20  5:52 AM   Specimen: BLOOD  Result Value Ref Range Status   Specimen Description BLOOD SITE NOT SPECIFIED  Final   Special Requests   Final    BOTTLES DRAWN AEROBIC AND ANAEROBIC Blood Culture adequate volume   Culture  Setup Time   Final    GRAM NEGATIVE RODS IN BOTH AEROBIC AND ANAEROBIC BOTTLES CRITICAL RESULT CALLED TO, READ BACK BY AND VERIFIED WITH: PHARMD G ABBOTT 06/07/20 AT 2313 SK Performed at [facility], [place] [address] [place], [place] [address]    Culture ESCHERICHIA COLI (A)  Final   Report Status 06/09/2020 FINAL  Final   Organism ID, Bacteria ESCHERICHIA COLI  Final      Susceptibility   Escherichia coli - MIC*    AMPICILLIN <=2 SENSITIVE Sensitive     CEFAZOLIN <=4 SENSITIVE Sensitive     CEFEPIME <=0.12 SENSITIVE Sensitive     CEFTAZIDIME <=1 SENSITIVE Sensitive     CEFTRIAXONE <=0.25 SENSITIVE Sensitive     CIPROFLOXACIN <=0.25 SENSITIVE Sensitive     GENTAMICIN <=1 SENSITIVE Sensitive     IMIPENEM <=0.25 SENSITIVE Sensitive     TRIMETH/SULFA <=20 SENSITIVE Sensitive     AMPICILLIN/SULBACTAM <=2 SENSITIVE Sensitive     PIP/TAZO <=4 SENSITIVE Sensitive     * ESCHERICHIA COLI  Blood Culture ID Panel (Reflexed)     Status: Abnormal   Collection Time: 06/07/20  5:52 AM  Result Value Ref Range Status   Enterococcus faecalis NOT DETECTED NOT DETECTED Final   Enterococcus Faecium NOT DETECTED NOT  DETECTED Final   Listeria monocytogenes  NOT DETECTED NOT DETECTED Final   Staphylococcus species NOT DETECTED NOT DETECTED Final   Staphylococcus aureus (BCID) NOT DETECTED NOT DETECTED Final   Staphylococcus epidermidis NOT DETECTED NOT DETECTED Final   Staphylococcus lugdunensis NOT DETECTED NOT DETECTED Final   Streptococcus species NOT DETECTED NOT DETECTED Final   Streptococcus agalactiae NOT DETECTED NOT DETECTED Final   Streptococcus pneumoniae NOT DETECTED NOT DETECTED Final   Streptococcus pyogenes NOT DETECTED NOT DETECTED Final   A.calcoaceticus-baumannii NOT DETECTED NOT DETECTED Final   Bacteroides fragilis NOT DETECTED NOT DETECTED Final   Enterobacterales DETECTED (A) NOT DETECTED Final    Comment: Enterobacterales represent a large order of gram negative bacteria, not a single organism. CRITICAL RESULT CALLED TO, READ BACK BY AND VERIFIED WITH: PHARMD G ABBOTT 06/07/20 AT 2313 SK    Enterobacter cloacae complex NOT DETECTED NOT DETECTED Final   Escherichia coli DETECTED (A) NOT DETECTED Final    Comment: CRITICAL RESULT CALLED TO, READ BACK BY AND VERIFIED WITH: PHARMD G ABBOTT 06/07/20 AT 2313 SK    Klebsiella aerogenes NOT DETECTED NOT DETECTED Final   Klebsiella oxytoca NOT DETECTED NOT DETECTED Final   Klebsiella pneumoniae NOT DETECTED NOT DETECTED Final   Proteus species NOT DETECTED NOT DETECTED Final   Salmonella species NOT DETECTED NOT DETECTED Final   Serratia marcescens NOT DETECTED NOT DETECTED Final   Haemophilus influenzae NOT DETECTED NOT DETECTED Final   Neisseria meningitidis NOT DETECTED NOT DETECTED Final   Pseudomonas aeruginosa NOT DETECTED NOT DETECTED Final   Stenotrophomonas maltophilia NOT DETECTED NOT DETECTED Final   Candida albicans NOT DETECTED NOT DETECTED Final   Candida auris NOT DETECTED NOT DETECTED Final   Candida glabrata NOT DETECTED NOT DETECTED Final   Candida krusei NOT DETECTED NOT DETECTED Final   Candida parapsilosis NOT DETECTED NOT DETECTED Final   Candida  tropicalis NOT DETECTED NOT DETECTED Final   Cryptococcus neoformans/gattii NOT DETECTED NOT DETECTED Final   CTX-M ESBL NOT DETECTED NOT DETECTED Final   Carbapenem resistance IMP NOT DETECTED NOT DETECTED Final   Carbapenem resistance KPC NOT DETECTED NOT DETECTED Final   Carbapenem resistance NDM NOT DETECTED NOT DETECTED Final   Carbapenem resist OXA 48 LIKE NOT DETECTED NOT DETECTED Final   Carbapenem resistance VIM NOT DETECTED NOT DETECTED Final    Comment: Performed at [facility], [address] [place], [place] [address]  Gastrointestinal Panel by PCR , Stool     Status: None   Collection Time: 06/08/20 10:03 AM   Specimen: Stool  Result Value Ref Range Status   Campylobacter species NOT DETECTED NOT DETECTED Final   Plesimonas shigelloides NOT DETECTED NOT DETECTED Final   Salmonella species NOT DETECTED NOT DETECTED Final   Yersinia enterocolitica NOT DETECTED NOT DETECTED Final   Vibrio species NOT DETECTED NOT DETECTED Final   Vibrio cholerae NOT DETECTED NOT DETECTED Final   Enteroaggregative E coli (EAEC) NOT DETECTED NOT DETECTED Final   Enteropathogenic E coli (EPEC) NOT DETECTED NOT DETECTED Final   Enterotoxigenic E coli (ETEC) NOT DETECTED NOT DETECTED Final   Shiga like toxin producing E coli (STEC) NOT DETECTED NOT DETECTED Final   Shigella/Enteroinvasive E coli (EIEC) NOT DETECTED NOT DETECTED Final   Cryptosporidium NOT DETECTED NOT DETECTED Final   Cyclospora cayetanensis NOT DETECTED NOT DETECTED Final   Entamoeba histolytica NOT DETECTED NOT DETECTED Final   Giardia lamblia NOT DETECTED NOT DETECTED Final   Adenovirus F40/41 NOT  DETECTED NOT DETECTED Final   Astrovirus NOT DETECTED NOT DETECTED Final   Norovirus GI/GII NOT DETECTED NOT DETECTED Final   Rotavirus A NOT DETECTED NOT DETECTED Final   Sapovirus (I, II, IV, and V) NOT DETECTED NOT DETECTED Final    Comment: Performed at [facility], [address], [place], [place] [address]           Radiology Studies: DG Abd 1 View - KUB  Result Date: 06/11/2020 CLINICAL DATA:  History of cholelithiasis.  Unsuccessful ERCP. EXAM: ERCP TECHNIQUE: Multiple spot images obtained with the fluoroscopic device and submitted for interpretation post-procedure. FLUOROSCOPY TIME:  23 minutes, 56 seconds COMPARISON:  CT abdomen and pelvis-06/07/2020 FINDINGS: Two spot intraoperative fluoroscopic images of the right upper abdominal quadrant during ERCP are provided for review. Initial image demonstrates an ERCP probe overlying the right upper abdominal quadrant. Subsequent image demonstrates apparent cannulation of the pancreatic duct. There is no definitive opacification of the common bile duct. IMPRESSION: ERCP as above. These images were submitted for radiologic interpretation only. Please see the procedural report for the amount of contrast and the fluoroscopy time utilized. Electronically Signed   By: Sandi Mariscal M.D.   On: 06/11/2020 12:09   DG C-Arm 1-60 Min  Result Date: 06/11/2020 CLINICAL DATA:  History of cholelithiasis.  Unsuccessful ERCP. EXAM: ERCP TECHNIQUE: Multiple spot images obtained with the fluoroscopic device and submitted for interpretation post-procedure. FLUOROSCOPY TIME:  23 minutes, 56 seconds COMPARISON:  CT abdomen and pelvis-06/07/2020 FINDINGS: Two spot intraoperative fluoroscopic images of the right upper abdominal quadrant during ERCP are provided for review. Initial image demonstrates an ERCP probe overlying the right upper abdominal quadrant. Subsequent image demonstrates apparent cannulation of the pancreatic duct. There is no definitive opacification of the common bile duct. IMPRESSION: ERCP as above. These images were submitted for radiologic interpretation only. Please see the procedural report for the amount of contrast and the fluoroscopy time utilized. Electronically Signed   By: Sandi Mariscal M.D.   On: 06/11/2020 12:09        Scheduled Meds: .  acidophilus  1 capsule Oral Q breakfast  . aspirin EC  81 mg Oral Daily  . B-complex with vitamin C  1 tablet Oral Q breakfast  . cholecalciferol  2,000 Units Oral q AM  . dolutegravir  50 mg Oral q AM  . doravirine  100 mg Oral q AM  . indomethacin  100 mg Rectal Once  . insulin aspart  0-15 Units Subcutaneous TID WC  . isosorbide mononitrate  30 mg Oral Daily  . metoprolol tartrate  12.5 mg Oral BID  . pantoprazole  40 mg Oral QAC breakfast  . potassium chloride SA  20 mEq Oral Daily  . sodium bicarbonate  650 mg Oral TID  . tamsulosin  0.4 mg Oral Daily  . valACYclovir  500 mg Oral q AM   Continuous Infusions: . cefTRIAXone (ROCEPHIN)  IV Stopped (06/10/20 0933)     LOS: 4 days    Time spent: 35 mins.More than 50% of that time was spent in counseling and/or coordination of care.      Shelly Coss, MD [facility] P11/03/2020, 3:14 PM

## 2020-06-11 NOTE — Progress Notes (Signed)
ERCP noted.  Unable to cannulate bile duct.  Pancreatic stent left in place.  Plans for repeat ERCP in the near future.  We will continue to remain available and await for completion of ERCP prior to lap chole.  [name] 2:41 PM 06/11/2020'

## 2020-06-11 NOTE — Plan of Care (Signed)

## 2020-06-11 NOTE — Op Note (Signed)
[facility] Patient Name: [name] Procedure Date : 06/11/2020 MRN: [id number] Attending MD: Docia Chuck. Henrene Pastor MD, MD Date of Birth: 1946-03-25 CSN: [id number] Age: 74 Admit Type: Inpatient Procedure:                ERCP with pancreatic duct stent placement Indications:              Bile duct stone on Computed Tomogram Scan, elevated                            liver tests, bacteremia Providers:                Docia Chuck. Henrene Pastor MD, MD, Benetta Spar RN, RN, Tyna Jaksch Technician Referring MD:             [facility] hospitalist Medicines:                General Anesthesia Complications:            No immediate complications. Estimated Blood Loss:     Estimated blood loss: none. Procedure:                Pre-Anesthesia Assessment:                           - Prior to the procedure, a History and Physical                            was performed, and patient medications and                            allergies were reviewed. The patient is competent.                            The risks and benefits of the procedure and the                            sedation options and risks were discussed with the                            patient. All questions were answered and informed                            consent was obtained. Patient identification and                            proposed procedure were verified by the physician.                            Mental Status Examination: alert and oriented.                            Airway Examination: normal oropharyngeal airway and  neck mobility. Respiratory Examination: clear to                            auscultation. CV Examination: normal. Prophylactic                            Antibiotics: The patient does not require                            prophylactic antibiotics. Prior Anticoagulants: The                            patient has taken Plavix (clopidogrel), last dose                             was 5 days prior to procedure. ASA Grade                            Assessment: III - A patient with severe systemic                            disease. After reviewing the risks and benefits,                            the patient was deemed in satisfactory condition to                            undergo the procedure. The anesthesia plan was to                            use general anesthesia. See anesthesia report.                            Immediately prior to administration of medications,                            the patient was re-assessed for adequacy to receive                            sedatives. The heart rate, respiratory rate, oxygen                            saturations, blood pressure, adequacy of pulmonary                            ventilation, and response to care were monitored                            throughout the procedure. The physical status of                            the patient was re-assessed after the procedure.  After obtaining informed consent, the scope was                            passed under direct vision. Throughout the                            procedure, the patient's blood pressure, pulse, and                            oxygen saturations were monitored continuously. The                            [id number]-Q180V ([id number] Olympus duodenoscope was                            introduced through the mouth, and used to inject                            contrast into and used to cannulate the dorsal                            pancreatic duct. The ERCP was difficult as                            described below. The patient tolerated the                            procedure well. Scope In: Scope Out: Findings:      1. Esophagus was examined. The upper GI tract was grossly normal      2. 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 The minor ampulla was not  sought      3. A scout radiograph of the abdomen with the endoscope in position was       unremarkable      4. Slight injection of contrast yielded a partial pancreatogram.       Multiple attempts at wire-guided cannulation of the bile duct were       unsuccessful. The guidewire was easily seated into the pancreatic duct.       Multiple attempts at wire over wire biliary cannulation were       unsuccessful. Standard and angled wires used. Subsequently, a 4 [place] 3       cm in length pancreatic duct stent with single external pigtail was       placed into the pancreatic duct. Multiple attempts at biliary       cannulation were again unsuccessful. It was elected to leave the       pancreatic duct stent in place. The procedure reported. Impression:               1. Failed biliary cannulation as described                           2. Status post pancreatic duct stent placement  3. Suspected choledocholithiasis Recommendation:           1. Standard post ERCP observation                           2. Indomethacin rectal suppositories given                           3. Plans for repeat ERCP with advanced therapeutic                            endoscopist. Case discussed with Dr. Rush Landmark                           NOTE: I did speak with the patient's daughter [name] [phone] by telephone to review today's                            case and tentative plans. Procedure Code(s):        --- Professional ---                           [phone], Endoscopic retrograde                            cholangiopancreatography (ERCP); with placement of                            endoscopic stent into biliary or pancreatic duct,                            including pre- and post-dilation and guide wire                            passage, when performed, including sphincterotomy,                            when performed, each stent                            [id number], Endoscopic retrograde                            cholangiopancreatography (ERCP); with removal of                            calculi/debris from biliary/pancreatic duct(s) Diagnosis Code(s):        --- Professional ---                           K80.50, Calculus of bile duct without cholangitis                            or cholecystitis without obstruction  K83.8, Other specified diseases of biliary tract CPT copyright 2019 [employer]. All rights reserved. The codes documented in this report are preliminary and upon coder review may  be revised to meet current compliance requirements. Docia Chuck. Henrene Pastor MD, MD 06/11/2020 11:14:39 AM This report has been signed electronically. Number of Addenda: 0

## 2020-06-12 ENCOUNTER — Inpatient Hospital Stay (HOSPITAL_COMMUNITY): Payer: Medicare Other

## 2020-06-12 DIAGNOSIS — R935 Abnormal findings on diagnostic imaging of other abdominal regions, including retroperitoneum: Secondary | ICD-10-CM

## 2020-06-12 DIAGNOSIS — A419 Sepsis, unspecified organism: Secondary | ICD-10-CM | POA: Diagnosis not present

## 2020-06-12 DIAGNOSIS — K8033 Calculus of bile duct with acute cholangitis with obstruction: Secondary | ICD-10-CM

## 2020-06-12 LAB — BASIC METABOLIC PANEL
Anion gap: 9 (ref 5–15)
BUN: 22 mg/dL (ref 8–23)
CO2: 23 mmol/L (ref 22–32)
Calcium: 8.6 mg/dL — ABNORMAL LOW (ref 8.9–10.3)
Chloride: 106 mmol/L (ref 98–111)
Creatinine, Ser: 1.43 mg/dL — ABNORMAL HIGH (ref 0.61–1.24)
GFR, Estimated: 52 mL/min — ABNORMAL LOW (ref >60)
Glucose, Bld: 175 mg/dL — ABNORMAL HIGH (ref 70–99)
Potassium: 4.4 mmol/L (ref 3.5–5.1)
Sodium: 138 mmol/L (ref 135–145)

## 2020-06-12 LAB — GLUCOSE, CAPILLARY
Glucose-Capillary: 121 mg/dL — ABNORMAL HIGH (ref 70–99)
Glucose-Capillary: 176 mg/dL — ABNORMAL HIGH (ref 70–99)
Glucose-Capillary: 122 mg/dL — ABNORMAL HIGH (ref 70–99)
Glucose-Capillary: 208 mg/dL — ABNORMAL HIGH (ref 70–99)

## 2020-06-12 MED ORDER — GADOBUTROL 1 MMOL/ML IV SOLN
6.0000 mL | Freq: Once | INTRAVENOUS | Status: AC | PRN
  Administered 2020-06-12: 6 mL via INTRAVENOUS

## 2020-06-12 NOTE — Progress Notes (Signed)
1 Day Post-Op   Subjective/Chief Complaint: No complaints this morning Denies abdominal pain   Objective: Vital signs in last 24 hours: Temp:  [97.1 F (36.2 C)-98.4 F (36.9 C)] 98.2 F (36.8 C) (11/09 0736) Pulse Rate:  [56-85] 66 (11/09 0927) Resp:  [13-22] 19 (11/09 0736) BP: (98-174)/(43-83) 168/60 (11/09 0927) SpO2:  [96 %-98 %] 96 % (11/09 0736) Last BM Date: 06/11/20  Intake/Output from previous day: 11/08 0701 - 11/09 0700 In: 750 [P.O.:250; I.V.:500] Out: 1100 [Urine:1100] Intake/Output this shift: Total I/O In: 240 [P.O.:240] Out: -   Exam: Awake and alert Looks comfortable Abdomen soft, non-tender  Lab Results:  Recent Labs    06/11/20 0217  WBC 5.5  HGB 11.5*  HCT 34.0*  PLT 164   BMET Recent Labs    06/11/20 0217 06/12/20 0456  NA 138 138  K 3.9 4.4  CL 107 106  CO2 21* 23  GLUCOSE 163* 175*  BUN 22 22  CREATININE 1.61* 1.43*  CALCIUM 8.5* 8.6*   PT/INR Recent Labs    06/11/20 0217  LABPROT 14.6  INR 1.2   ABG No results for input(s): PHART, HCO3 in the last 72 hours.  Invalid input(s): PCO2, PO2  Studies/Results: DG Abd 1 View - KUB  Result Date: 06/11/2020 CLINICAL DATA:  History of cholelithiasis.  Unsuccessful ERCP. EXAM: ERCP TECHNIQUE: Multiple spot images obtained with the fluoroscopic device and submitted for interpretation post-procedure. FLUOROSCOPY TIME:  23 minutes, 56 seconds COMPARISON:  CT abdomen and pelvis-06/07/2020 FINDINGS: Two spot intraoperative fluoroscopic images of the right upper abdominal quadrant during ERCP are provided for review. Initial image demonstrates an ERCP probe overlying the right upper abdominal quadrant. Subsequent image demonstrates apparent cannulation of the pancreatic duct. There is no definitive opacification of the common bile duct. IMPRESSION: ERCP as above. These images were submitted for radiologic interpretation only. Please see the procedural report for the amount of contrast and  the fluoroscopy time utilized. Electronically Signed   By: Sandi Mariscal M.D.   On: 06/11/2020 12:09   DG C-Arm 1-60 Min  Result Date: 06/11/2020 CLINICAL DATA:  History of cholelithiasis.  Unsuccessful ERCP. EXAM: ERCP TECHNIQUE: Multiple spot images obtained with the fluoroscopic device and submitted for interpretation post-procedure. FLUOROSCOPY TIME:  23 minutes, 56 seconds COMPARISON:  CT abdomen and pelvis-06/07/2020 FINDINGS: Two spot intraoperative fluoroscopic images of the right upper abdominal quadrant during ERCP are provided for review. Initial image demonstrates an ERCP probe overlying the right upper abdominal quadrant. Subsequent image demonstrates apparent cannulation of the pancreatic duct. There is no definitive opacification of the common bile duct. IMPRESSION: ERCP as above. These images were submitted for radiologic interpretation only. Please see the procedural report for the amount of contrast and the fluoroscopy time utilized. Electronically Signed   By: Sandi Mariscal M.D.   On: 06/11/2020 12:09    Anti-infectives: Anti-infectives (From admission, onward)   Start     Dose/Rate Route Frequency Ordered Stop   06/08/20 0800  cefTRIAXone (ROCEPHIN) 2 g in sodium chloride 0.9 % 100 mL IVPB        2 g 200 mL/hr over 30 Minutes Intravenous Every 24 hours 06/07/20 1102     06/07/20 1430  doravirine (PIFELTRO) tablet 100 mg       Note to Pharmacy: OP CHE:NIDP 1 TABLET(100 MG) BY MOUTH DAILY with Tivicay Patient taking differently: Take 100 mg by mouth in the morning (with Tivicay)     100 mg Oral Every morning 06/07/20 1102  06/07/20 1345  dolutegravir (TIVICAY) tablet 50 mg        50 mg Oral Every morning 06/07/20 1102     06/07/20 1200  metroNIDAZOLE (FLAGYL) IVPB 500 mg  Status:  Discontinued        500 mg 100 mL/hr over 60 Minutes Intravenous Every 8 hours 06/07/20 1102 06/07/20 2318   06/07/20 1115  valACYclovir (VALTREX) tablet 500 mg        500 mg Oral Every morning  06/07/20 1102     06/07/20 0715  piperacillin-tazobactam (ZOSYN) IVPB 3.375 g        3.375 g 100 mL/hr over 30 Minutes Intravenous  Once 06/07/20 0703 06/07/20 0831   06/07/20 0630  cefTRIAXone (ROCEPHIN) 1 g in sodium chloride 0.9 % 100 mL IVPB        1 g 200 mL/hr over 30 Minutes Intravenous  Once 06/07/20 0618 06/07/20 0705      Assessment/Plan: s/p Procedure(s): ENDOSCOPIC RETROGRADE CHOLANGIOPANCREATOGRAPHY (ERCP) WITH PROPOFOL (N/A) PANCREATIC STENT PLACEMENT  LFTs improved s/p yesterdays procedure.   Repeat ERCP potentially tomorrow per the patient. Lap chole to follow   LOS: 5 days    [name] 06/12/2020

## 2020-06-12 NOTE — Plan of Care (Signed)
  Problem: Education: Goal: Knowledge of General Education information will improve Description: Including pain rating scale, medication(s)/side effects and non-pharmacologic comfort measures Outcome: Progressing   Problem: Skin Integrity: Goal: Risk for impaired skin integrity will decrease Outcome: Progressing   Problem: Safety: Goal: Ability to remain free from injury will improve Outcome: Progressing   Problem: Respiratory: Goal: Ability to maintain a clear airway will improve Outcome: Progressing

## 2020-06-12 NOTE — Progress Notes (Signed)
PROGRESS NOTE    [name]  ZOX:[id number] DOB: [birth year] DOA: 06/07/2020 PCP: Dorothyann Peng, NP   Chief Complain: Abdominal pain  Brief Narrative:   Patient is a 74 year old male with history of coronary disease status post recent left heart cath with successful PTCA currently taking aspirin Brilinta, diabetes type 2, status 3B CKD, hypertension, HIV, chronic diastolic congestive heart failure, BPH who presents to the emergency department with complaints of abdominal pain mainly in the epigastric region and right upper quadrant.  In the emergency department, he also had fever and was tachycardic.  Patient was found to have elevated liver enzymes on presentation, elevated bilirubin, elevated lactate, elevated troponin.  CT abdomen/pelvis showed cholelithiasis, multiple stones in the distal CBD resulting in mild CBD dilation.  Procalcitonin was elevated.  Patient was admitted for the management of possible sepsis secondary to possible cholangitis.  GI ,general surgery ,cardiology following.  Unsuccessful ERCP on 06/11/20 but stent placed in pancreatic duct.  Plan to repeat ERCP in the near future.  Assessment & Plan:   Principal Problem:   Sepsis ([place]) Active Problems:   Human immunodeficiency virus (HIV) disease (HCC)   Depression, recurrent ([place])   Chronic obstructive pulmonary disease ([place])   HIV disease ([place])   Chronic kidney disease, stage 3b ([place])   Coronary artery disease   Type 2 diabetes mellitus with stage 3b chronic kidney disease, without long-term current use of insulin ([place])   Benign prostatic hyperplasia without lower urinary tract symptoms   Chronic diastolic CHF (congestive heart failure) (HCC)   Calculus of bile duct with acute cholangitis   Pre-op evaluation   Transaminitis   Epigastric abdominal pain   Abnormal CT of the abdomen   Severe sepsis secondary to possible acute cholangitis: Presented with fever, tachycardia, lactic acidosis, AKI, elevated white cell  counts.  Elevated procalcitonin. continue current antibiotic.  Sepsis physiology has resolved.  Gram-negative bacteremia: Blood cultures showed pansenstive E. coli.  Continue current antibiotics.  Will change antibiotics to oral when appropriate.  We will plan to continue abx  for total of 2 weeks course  Choledocholithiasis/suspected cholangitis: Presented with abdominal pain mainly in the epigastric and right upper quadrant.  CT finding as above.  Right upper quadrant ultrasound showed tiny stones and sludge noted layering within the gallbladder,no secondary signs of acute cholecystitis,mild increase caliber of the common bile duct which measures up,no signs of choledocholithiasis.  Elevated liver enzymes and bilirubin.  Since he presented with sepsis-like picture along with above findings, cholangitis was suspected.Improving liver enzymes, lipase level. Underwent ERCP but unsuccessful biliary cannulation , stent placed in pancreatic duct.  Plan to repeat ERCP in the near future. General surgery also planning for cholecystectomy after ERCP. GI ordered MRCP today.  AKI on CKD stage IIIb: Presented with creatinine in the range of 2.  Baseline creatinine ranges from 1.5-1.8.  Kidney function improved with IV fluids.  Diarrhea: Reported 4-5 episodes of diarrhea earlier but has resolved  now.    Continue Imodium as needed.  Denies any abdominal pain  Coronary artery disease status post CABG: History of severe coronary artery disease.  Underwent PTCA on October this year and currently on aspirin and Brilinta.  Noted elevated troponin.  Present denies any chest pain.  Elevated troponin could be from supply demand ischemia from sepsis.  Continue nitrates, beta-blockers.  Statin on hold due to elevated liver enzymes. Cardiology following and recommended to restart Brilinta when there is no further plan for operative intervention.  Diastolic  congestive heart failure: lasix on hold.  On gentle IV fluids due  to concern for sepsis, dehydration and AKI.  Continue current medications.Echo done on this admission showed ejection fraction of 60 to 65%, no wall motion abnormality.  HIV: Continue HIV therapy  Hypertension:  Continue current regimen.  Monitor blood pressure             DVT prophylaxis:SCD Code Status: Full Family Communication: called daughter on phone on 06/10/20,call not received Status is: Inpatient  Remains inpatient appropriate because:Inpatient level of care appropriate due to severity of illness   Dispo: The patient is from: Home              Anticipated d/c is to: Home              Anticipated d/c date is:2-3 days              Patient currently is not medically stable to d/c. Plan for repeat  ERCP followed by cholecystectomy    Consultants: GI,cardiology  Procedures:None  Antimicrobials:  Anti-infectives (From admission, onward)   Start     Dose/Rate Route Frequency Ordered Stop   06/08/20 0800  cefTRIAXone (ROCEPHIN) 2 g in sodium chloride 0.9 % 100 mL IVPB        2 g 200 mL/hr over 30 Minutes Intravenous Every 24 hours 06/07/20 1102     06/07/20 1430  doravirine (PIFELTRO) tablet 100 mg       Note to Pharmacy: OP [facility]:ENID 1 TABLET(100 MG) BY MOUTH DAILY with Tivicay Patient taking differently: Take 100 mg by mouth in the morning (with Tivicay)     100 mg Oral Every morning 06/07/20 1102     06/07/20 1345  dolutegravir (TIVICAY) tablet 50 mg        50 mg Oral Every morning 06/07/20 1102     06/07/20 1200  metroNIDAZOLE (FLAGYL) IVPB 500 mg  Status:  Discontinued        500 mg 100 mL/hr over 60 Minutes Intravenous Every 8 hours 06/07/20 1102 06/07/20 2318   06/07/20 1115  valACYclovir (VALTREX) tablet 500 mg        500 mg Oral Every morning 06/07/20 1102     06/07/20 0715  piperacillin-tazobactam (ZOSYN) IVPB 3.375 g        3.375 g 100 mL/hr over 30 Minutes Intravenous  Once 06/07/20 0703 06/07/20 0831   06/07/20 0630  cefTRIAXone (ROCEPHIN) 1 g in  sodium chloride 0.9 % 100 mL IVPB        1 g 200 mL/hr over 30 Minutes Intravenous  Once 06/07/20 0618 06/07/20 0705      Subjective:  Patient seen and examined at the bedside this morning.  Hemodynamically stable.  Comfortable.  Denies any abdominal pain, nausea or vomiting.  Objective: Vitals:   06/11/20 2136 06/11/20 2300 06/12/20 0437 06/12/20 0736  BP:  (!) 148/71 (!) 171/61 (!) 171/74  Pulse: 63 62 (!) 56 67  Resp:  16 16 19   Temp:  97.6 F (36.4 C) 97.6 F (36.4 C) 98.2 F (36.8 C)  TempSrc:  Oral Oral Oral  SpO2:  98% 98% 96%  Weight:      Height:        Intake/Output Summary (Last 24 hours) at 06/12/2020 0842 Last data filed at 06/12/2020 0200 Gross per 24 hour  Intake 750 ml  Output 1100 ml  Net -350 ml   Filed Weights   06/07/20 1944  Weight: 61.2 kg  Examination:  General exam: Appears calm and comfortable ,Not in distress,average built HEENT:PERRL,Oral mucosa moist, Ear/Nose normal on gross exam Respiratory system: Bilateral equal air entry, normal vesicular breath sounds, no wheezes or crackles  Cardiovascular system: S1 & S2 heard, RRR. No JVD, murmurs, rubs, gallops or clicks. Gastrointestinal system: Abdomen is nondistended, soft and nontender. No organomegaly or masses felt. Normal bowel sounds heard. Central nervous system: Alert and oriented. No focal neurological deficits. Extremities: No edema, no clubbing ,no cyanosis, distal peripheral pulses palpable. Skin: No rashes, lesions or ulcers,no icterus ,no pallor    Data Reviewed: I have personally reviewed following labs and imaging studies  CBC: Recent Labs  Lab 06/07/20 0436 06/08/20 0312 06/09/20 0456 06/11/20 0217  WBC 3.6* 5.8 6.4 5.5  NEUTROABS 3.4  --  5.3  --   HGB 13.6 10.8* 11.8* 11.5*  HCT 40.7 32.1* 35.0* 34.0*  MCV 97.1 93.3 92.8 93.2  PLT 128* 121* 144* 591   Basic Metabolic Panel: Recent Labs  Lab 06/08/20 0312 06/09/20 0456 06/10/20 0856 06/11/20 0217  06/12/20 0456  NA 142 138 139 138 138  K 3.5 4.0 3.9 3.9 4.4  CL 113* 111 109 107 106  CO2 21* 15* 20* 21* 23  GLUCOSE 113* 206* 137* 163* 175*  BUN 22 18 21 22 22   CREATININE 1.81* 1.59* 1.60* 1.61* 1.43*  CALCIUM 7.8* 8.4* 8.8* 8.5* 8.6*   GFR: Estimated Creatinine Clearance: 37.6 mL/min (A) (by C-G formula based on SCr of 1.43 mg/dL (H)). Liver Function Tests: Recent Labs  Lab 06/05/20 1101 06/07/20 0436 06/09/20 0456 06/11/20 0217  AST 56* 133* 82* 72*  ALT 69* 162* 97* 101*  ALKPHOS  --  79 67 73  BILITOT 1.2 6.4* 3.5* 1.1  PROT 6.6 7.5 5.9* 5.8*  ALBUMIN  --  3.7 2.6* 2.4*   Recent Labs  Lab 06/07/20 0436 06/09/20 0456  LIPASE 66* 88*   No results for input(s): AMMONIA in the last 168 hours. Coagulation Profile: Recent Labs  Lab 06/07/20 0552 06/08/20 0312 06/11/20 0217  INR 1.4* 1.3* 1.2   Cardiac Enzymes: No results for input(s): CKTOTAL, CKMB, CKMBINDEX, TROPONINI in the last 168 hours. BNP (last 3 results) No results for input(s): PROBNP in the last 8760 hours. HbA1C: No results for input(s): HGBA1C in the last 72 hours. CBG: Recent Labs  Lab 06/11/20 1111 06/11/20 1218 06/11/20 1556 06/11/20 2050 06/12/20 0734  GLUCAP 255* 262* 185* 226* 121*   Lipid Profile: No results for input(s): CHOL, HDL, LDLCALC, TRIG, CHOLHDL, LDLDIRECT in the last 72 hours. Thyroid Function Tests: No results for input(s): TSH, T4TOTAL, FREET4, T3FREE, THYROIDAB in the last 72 hours. Anemia Panel: No results for input(s): VITAMINB12, FOLATE, FERRITIN, TIBC, IRON, RETICCTPCT in the last 72 hours. Sepsis Labs: Recent Labs  Lab 06/07/20 0552 06/07/20 0803 06/08/20 0312  PROCALCITON  --   --  60.40  LATICACIDVEN 1.5 1.0  --     Recent Results (from the past 240 hour(s))  Respiratory Panel by RT PCR (Flu A&B, Covid) - Nasopharyngeal Swab     Status: None   Collection Time: 06/07/20  5:51 AM   Specimen: Nasopharyngeal Swab  Result Value Ref Range Status    SARS Coronavirus 2 by RT PCR NEGATIVE NEGATIVE Final    Comment: (NOTE) SARS-CoV-2 target nucleic acids are NOT DETECTED.  The SARS-CoV-2 RNA is generally detectable in upper respiratoy specimens during the acute phase of infection. The lowest concentration of SARS-CoV-2 viral copies this assay can  detect is 131 copies/mL. A negative result does not preclude SARS-Cov-2 infection and should not be used as the sole basis for treatment or other patient management decisions. A negative result may occur with  improper specimen collection/handling, submission of specimen other than nasopharyngeal swab, presence of viral mutation(s) within the areas targeted by this assay, and inadequate number of viral copies (<131 copies/mL). A negative result must be combined with clinical observations, patient history, and epidemiological information. The expected result is Negative.  Fact Sheet for Patients:  PinkCheek.be  Fact Sheet for Healthcare Providers:  GravelBags.it  This test is no t yet approved or cleared by the [place] FDA and  has been authorized for detection and/or diagnosis of SARS-CoV-2 by FDA under an Emergency Use Authorization (EUA). This EUA will remain  in effect (meaning this test can be used) for the duration of the COVID-19 declaration under Section 564(b)(1) of the Act, 21 U.S.C. section 360bbb-3(b)(1), unless the authorization is terminated or revoked sooner.     Influenza A by PCR NEGATIVE NEGATIVE Final   Influenza B by PCR NEGATIVE NEGATIVE Final    Comment: (NOTE) The Xpert Xpress SARS-CoV-2/FLU/RSV assay is intended as an aid in  the diagnosis of influenza from Nasopharyngeal swab specimens and  should not be used as a sole basis for treatment. Nasal washings and  aspirates are unacceptable for Xpert Xpress SARS-CoV-2/FLU/RSV  testing.  Fact Sheet for  Patients: PinkCheek.be  Fact Sheet for Healthcare Providers: GravelBags.it  This test is not yet approved or cleared by the [place] FDA and  has been authorized for detection and/or diagnosis of SARS-CoV-2 by  FDA under an Emergency Use Authorization (EUA). This EUA will remain  in effect (meaning this test can be used) for the duration of the  Covid-19 declaration under Section 564(b)(1) of the Act, 21  U.S.C. section 360bbb-3(b)(1), unless the authorization is  terminated or revoked. Performed at [facility], [place] [address] [place], [place] [address]   Blood culture (routine single)     Status: Abnormal   Collection Time: 06/07/20  5:52 AM   Specimen: BLOOD  Result Value Ref Range Status   Specimen Description BLOOD SITE NOT SPECIFIED  Final   Special Requests   Final    BOTTLES DRAWN AEROBIC AND ANAEROBIC Blood Culture adequate volume   Culture  Setup Time   Final    GRAM NEGATIVE RODS IN BOTH AEROBIC AND ANAEROBIC BOTTLES CRITICAL RESULT CALLED TO, READ BACK BY AND VERIFIED WITH: PHARMD G ABBOTT 06/07/20 AT 2313 SK Performed at [facility], [place] [address] [place], [place] [address]    Culture ESCHERICHIA COLI (A)  Final   Report Status 06/09/2020 FINAL  Final   Organism ID, Bacteria ESCHERICHIA COLI  Final      Susceptibility   Escherichia coli - MIC*    AMPICILLIN <=2 SENSITIVE Sensitive     CEFAZOLIN <=4 SENSITIVE Sensitive     CEFEPIME <=0.12 SENSITIVE Sensitive     CEFTAZIDIME <=1 SENSITIVE Sensitive     CEFTRIAXONE <=0.25 SENSITIVE Sensitive     CIPROFLOXACIN <=0.25 SENSITIVE Sensitive     GENTAMICIN <=1 SENSITIVE Sensitive     IMIPENEM <=0.25 SENSITIVE Sensitive     TRIMETH/SULFA <=20 SENSITIVE Sensitive     AMPICILLIN/SULBACTAM <=2 SENSITIVE Sensitive     PIP/TAZO <=4 SENSITIVE Sensitive     * ESCHERICHIA COLI  Blood Culture ID Panel (Reflexed)     Status: Abnormal   Collection  Time:  06/07/20  5:52 AM  Result Value Ref Range Status   Enterococcus faecalis NOT DETECTED NOT DETECTED Final   Enterococcus Faecium NOT DETECTED NOT DETECTED Final   Listeria monocytogenes NOT DETECTED NOT DETECTED Final   Staphylococcus species NOT DETECTED NOT DETECTED Final   Staphylococcus aureus (BCID) NOT DETECTED NOT DETECTED Final   Staphylococcus epidermidis NOT DETECTED NOT DETECTED Final   Staphylococcus lugdunensis NOT DETECTED NOT DETECTED Final   Streptococcus species NOT DETECTED NOT DETECTED Final   Streptococcus agalactiae NOT DETECTED NOT DETECTED Final   Streptococcus pneumoniae NOT DETECTED NOT DETECTED Final   Streptococcus pyogenes NOT DETECTED NOT DETECTED Final   A.calcoaceticus-baumannii NOT DETECTED NOT DETECTED Final   Bacteroides fragilis NOT DETECTED NOT DETECTED Final   Enterobacterales DETECTED (A) NOT DETECTED Final    Comment: Enterobacterales represent a large order of gram negative bacteria, not a single organism. CRITICAL RESULT CALLED TO, READ BACK BY AND VERIFIED WITH: PHARMD G ABBOTT 06/07/20 AT 2313 SK    Enterobacter cloacae complex NOT DETECTED NOT DETECTED Final   Escherichia coli DETECTED (A) NOT DETECTED Final    Comment: CRITICAL RESULT CALLED TO, READ BACK BY AND VERIFIED WITH: PHARMD G ABBOTT 06/07/20 AT 2313 SK    Klebsiella aerogenes NOT DETECTED NOT DETECTED Final   Klebsiella oxytoca NOT DETECTED NOT DETECTED Final   Klebsiella pneumoniae NOT DETECTED NOT DETECTED Final   Proteus species NOT DETECTED NOT DETECTED Final   Salmonella species NOT DETECTED NOT DETECTED Final   Serratia marcescens NOT DETECTED NOT DETECTED Final   Haemophilus influenzae NOT DETECTED NOT DETECTED Final   Neisseria meningitidis NOT DETECTED NOT DETECTED Final   Pseudomonas aeruginosa NOT DETECTED NOT DETECTED Final   Stenotrophomonas maltophilia NOT DETECTED NOT DETECTED Final   Candida albicans NOT DETECTED NOT DETECTED Final   Candida auris NOT  DETECTED NOT DETECTED Final   Candida glabrata NOT DETECTED NOT DETECTED Final   Candida krusei NOT DETECTED NOT DETECTED Final   Candida parapsilosis NOT DETECTED NOT DETECTED Final   Candida tropicalis NOT DETECTED NOT DETECTED Final   Cryptococcus neoformans/gattii NOT DETECTED NOT DETECTED Final   CTX-M ESBL NOT DETECTED NOT DETECTED Final   Carbapenem resistance IMP NOT DETECTED NOT DETECTED Final   Carbapenem resistance KPC NOT DETECTED NOT DETECTED Final   Carbapenem resistance NDM NOT DETECTED NOT DETECTED Final   Carbapenem resist OXA 48 LIKE NOT DETECTED NOT DETECTED Final   Carbapenem resistance VIM NOT DETECTED NOT DETECTED Final    Comment: Performed at [facility], [address] [place], [place] [address]  Gastrointestinal Panel by PCR , Stool     Status: None   Collection Time: 06/08/20 10:03 AM   Specimen: Stool  Result Value Ref Range Status   Campylobacter species NOT DETECTED NOT DETECTED Final   Plesimonas shigelloides NOT DETECTED NOT DETECTED Final   Salmonella species NOT DETECTED NOT DETECTED Final   Yersinia enterocolitica NOT DETECTED NOT DETECTED Final   Vibrio species NOT DETECTED NOT DETECTED Final   Vibrio cholerae NOT DETECTED NOT DETECTED Final   Enteroaggregative E coli (EAEC) NOT DETECTED NOT DETECTED Final   Enteropathogenic E coli (EPEC) NOT DETECTED NOT DETECTED Final   Enterotoxigenic E coli (ETEC) NOT DETECTED NOT DETECTED Final   Shiga like toxin producing E coli (STEC) NOT DETECTED NOT DETECTED Final   Shigella/Enteroinvasive E coli (EIEC) NOT DETECTED NOT DETECTED Final   Cryptosporidium NOT DETECTED NOT DETECTED Final   Cyclospora cayetanensis NOT DETECTED NOT  DETECTED Final   Entamoeba histolytica NOT DETECTED NOT DETECTED Final   Giardia lamblia NOT DETECTED NOT DETECTED Final   Adenovirus F40/41 NOT DETECTED NOT DETECTED Final   Astrovirus NOT DETECTED NOT DETECTED Final   Norovirus GI/GII NOT DETECTED NOT DETECTED Final    Rotavirus A NOT DETECTED NOT DETECTED Final   Sapovirus (I, II, IV, and V) NOT DETECTED NOT DETECTED Final    Comment: Performed at [facility], [address], [place], [place] [address]         Radiology Studies: DG Abd 1 View - KUB  Result Date: 06/11/2020 CLINICAL DATA:  History of cholelithiasis.  Unsuccessful ERCP. EXAM: ERCP TECHNIQUE: Multiple spot images obtained with the fluoroscopic device and submitted for interpretation post-procedure. FLUOROSCOPY TIME:  23 minutes, 56 seconds COMPARISON:  CT abdomen and pelvis-06/07/2020 FINDINGS: Two spot intraoperative fluoroscopic images of the right upper abdominal quadrant during ERCP are provided for review. Initial image demonstrates an ERCP probe overlying the right upper abdominal quadrant. Subsequent image demonstrates apparent cannulation of the pancreatic duct. There is no definitive opacification of the common bile duct. IMPRESSION: ERCP as above. These images were submitted for radiologic interpretation only. Please see the procedural report for the amount of contrast and the fluoroscopy time utilized. Electronically Signed   By: Sandi Mariscal M.D.   On: 06/11/2020 12:09   DG C-Arm 1-60 Min  Result Date: 06/11/2020 CLINICAL DATA:  History of cholelithiasis.  Unsuccessful ERCP. EXAM: ERCP TECHNIQUE: Multiple spot images obtained with the fluoroscopic device and submitted for interpretation post-procedure. FLUOROSCOPY TIME:  23 minutes, 56 seconds COMPARISON:  CT abdomen and pelvis-06/07/2020 FINDINGS: Two spot intraoperative fluoroscopic images of the right upper abdominal quadrant during ERCP are provided for review. Initial image demonstrates an ERCP probe overlying the right upper abdominal quadrant. Subsequent image demonstrates apparent cannulation of the pancreatic duct. There is no definitive opacification of the common bile duct. IMPRESSION: ERCP as above. These images were submitted for radiologic interpretation only.  Please see the procedural report for the amount of contrast and the fluoroscopy time utilized. Electronically Signed   By: Sandi Mariscal M.D.   On: 06/11/2020 12:09        Scheduled Meds: . acidophilus  1 capsule Oral Q breakfast  . aspirin EC  81 mg Oral Daily  . B-complex with vitamin C  1 tablet Oral Q breakfast  . cholecalciferol  2,000 Units Oral q AM  . dolutegravir  50 mg Oral q AM  . doravirine  100 mg Oral q AM  . indomethacin  100 mg Rectal Once  . insulin aspart  0-15 Units Subcutaneous TID WC  . isosorbide mononitrate  30 mg Oral Daily  . metoprolol tartrate  12.5 mg Oral BID  . pantoprazole  40 mg Oral QAC breakfast  . potassium chloride SA  20 mEq Oral Daily  . sodium bicarbonate  650 mg Oral TID  . tamsulosin  0.4 mg Oral Daily  . valACYclovir  500 mg Oral q AM   Continuous Infusions: . cefTRIAXone (ROCEPHIN)  IV Stopped (06/10/20 0933)     LOS: 5 days    Time spent: 35 mins.More than 50% of that time was spent in counseling and/or coordination of care.      Shelly Coss, MD [facility] P11/04/2020, 8:42 AM

## 2020-06-12 NOTE — Progress Notes (Signed)
Daily Rounding Note  06/12/2020, 10:21 AM  LOS: 5 days   SUBJECTIVE:   Chief complaint: Choledocholithiasis.  Cholangitis.  Has not had any recurrence of significant abdominal pain or nausea since the day he arrived.  He is feeling good.  Tolerating solid food.  OBJECTIVE:         Vital signs in last 24 hours:    Temp:  [97.1 F (36.2 C)-98.4 F (36.9 C)] 98.2 F (36.8 C) (11/09 0736) Pulse Rate:  [56-85] 66 (11/09 0927) Resp:  [13-22] 19 (11/09 0736) BP: (98-174)/(43-83) 168/60 (11/09 0927) SpO2:  [96 %-98 %] 96 % (11/09 0736) Last BM Date: 06/11/20 Filed Weights   06/07/20 1944  Weight: 61.2 kg   General: Looks well. Neck: Lipodystrophy with fat pockets on the occipital region of his neck and a dowagers hump. Heart: RRR. Chest: Clear bilaterally with excellent breath sounds. Abdomen: Not tender, not distended.  Active bowel sounds.  Soft. Extremities: No CCE. Neuro/Psych: Pleasant, calm, cooperative.  Oriented x3.  No tremors or gross deficits.  Fluid speech.  Intake/Output from previous day: 11/08 0701 - 11/09 0700 In: 750 [P.O.:250; I.V.:500] Out: 1100 [Urine:1100]  Intake/Output this shift: Total I/O In: 240 [P.O.:240] Out: -   Lab Results: Recent Labs    06/11/20 0217  WBC 5.5  HGB 11.5*  HCT 34.0*  PLT 164   BMET Recent Labs    06/10/20 0856 06/11/20 0217 06/12/20 0456  NA 139 138 138  K 3.9 3.9 4.4  CL 109 107 106  CO2 20* 21* 23  GLUCOSE 137* 163* 175*  BUN 21 22 22   CREATININE 1.60* 1.61* 1.43*  CALCIUM 8.8* 8.5* 8.6*   LFT Recent Labs    06/11/20 0217  PROT 5.8*  ALBUMIN 2.4*  AST 72*  ALT 101*  ALKPHOS 73  BILITOT 1.1   PT/INR Recent Labs    06/11/20 0217  LABPROT 14.6  INR 1.2   Hepatitis Panel No results for input(s): HEPBSAG, HCVAB, HEPAIGM, HEPBIGM in the last 72 hours.  Studies/Results: DG Abd 1 View - KUB  Result Date: 06/11/2020 CLINICAL DATA:   History of cholelithiasis.  Unsuccessful ERCP. EXAM: ERCP TECHNIQUE: Multiple spot images obtained with the fluoroscopic device and submitted for interpretation post-procedure. FLUOROSCOPY TIME:  23 minutes, 56 seconds COMPARISON:  CT abdomen and pelvis-06/07/2020 FINDINGS: Two spot intraoperative fluoroscopic images of the right upper abdominal quadrant during ERCP are provided for review. Initial image demonstrates an ERCP probe overlying the right upper abdominal quadrant. Subsequent image demonstrates apparent cannulation of the pancreatic duct. There is no definitive opacification of the common bile duct. IMPRESSION: ERCP as above. These images were submitted for radiologic interpretation only. Please see the procedural report for the amount of contrast and the fluoroscopy time utilized. Electronically Signed   By: Sandi Mariscal M.D.   On: 06/11/2020 12:09   DG C-Arm 1-60 Min  Result Date: 06/11/2020 CLINICAL DATA:  History of cholelithiasis.  Unsuccessful ERCP. EXAM: ERCP TECHNIQUE: Multiple spot images obtained with the fluoroscopic device and submitted for interpretation post-procedure. FLUOROSCOPY TIME:  23 minutes, 56 seconds COMPARISON:  CT abdomen and pelvis-06/07/2020 FINDINGS: Two spot intraoperative fluoroscopic images of the right upper abdominal quadrant during ERCP are provided for review. Initial image demonstrates an ERCP probe overlying the right upper abdominal quadrant. Subsequent image demonstrates apparent cannulation of the pancreatic duct. There is no definitive opacification of the common bile duct. IMPRESSION: ERCP as above. These images  were submitted for radiologic interpretation only. Please see the procedural report for the amount of contrast and the fluoroscopy time utilized. Electronically Signed   By: Sandi Mariscal M.D.   On: 06/11/2020 12:09    ASSESMENT:   *   Choledocholithiasis. Unsuccessful ERCP 11/8.  Dr. Henrene Pastor unable to access CBD but suspected choledocholithiasis.   Pancreatic duct stent placed. LFTs continue study improvement, question passed stone.  *    E coli bacteremia, cholangitis.  Rocephin day 6  *    Cholelithiasis.  Surgery on board.  Timing of lap chole TBD.  *    CAD.  Hx CABG, non-STEMI's, stents/PCI's.  10/25 PCI to in-stent restenosis at time of non-STEMI.  Restarted on Brilinta but this is currently on hold to allow for biliary procedure.  Last dose was 11/3.  *   HIV.  Compliant w all meds.    *   DM2.    *   CKD 3.    *   Normocytic anemia..  Hgb stable.    *   Adenomatous colon polyps, hemorrhoids 2013, no polyps on repeat colon 11/2019.     PLAN   *   Dr Rush Landmark is not going to be able to perform ERCP tomorrow, soonest would be on Friday but even that is not assured.  His suggestion is repeat MRCP or lap chole w IOC.   After discussion with Dr. Ninfa Linden, ordering MRCP today.  Should get done by later this afternoon.  She needs to be n.p.o. for this so orders placed.  *   ? how long to continue Rocephin?    Azucena Freed  06/12/2020, 10:21 AM Phone [phone]

## 2020-06-12 NOTE — Plan of Care (Signed)

## 2020-06-12 NOTE — Care Management Important Message (Signed)
Important Message  Patient Details  Name: [name] MRN: [id number] Date of Birth: July 24, 1946   Medicare Important Message Given:  Yes     Orbie Pyo 06/12/2020, 1:37 PM

## 2020-06-13 ENCOUNTER — Encounter (HOSPITAL_COMMUNITY): Payer: Self-pay | Admitting: Internal Medicine

## 2020-06-13 DIAGNOSIS — K8042 Calculus of bile duct with acute cholecystitis without obstruction: Secondary | ICD-10-CM

## 2020-06-13 LAB — GLUCOSE, CAPILLARY
Glucose-Capillary: 128 mg/dL — ABNORMAL HIGH (ref 70–99)
Glucose-Capillary: 142 mg/dL — ABNORMAL HIGH (ref 70–99)
Glucose-Capillary: 115 mg/dL — ABNORMAL HIGH (ref 70–99)
Glucose-Capillary: 161 mg/dL — ABNORMAL HIGH (ref 70–99)

## 2020-06-13 NOTE — Assessment & Plan Note (Signed)
-   continue home meds 

## 2020-06-13 NOTE — Assessment & Plan Note (Addendum)
Fever, tachycardia, lactic acidosis, AKI, leukocytosis.  Work-up consistent with choledocholithiasis and cholangitis.  Also developed E. coli bacteremia from presumed translocation of GI source - Rocephin further narrowed to Ancef; continue to complete 2 week course total (can transition to PO after surgery and he remains stable) -If fever, will need repeat blood cultures -Descalated to Keflex at discharge to complete course

## 2020-06-13 NOTE — Assessment & Plan Note (Signed)
Presented with creatinine in the range of 2.  Baseline creatinine ranges from 1.5-1.8.  Kidney function improved with IV fluids.

## 2020-06-13 NOTE — Progress Notes (Signed)
Results of MRCP noted: cholelithiasis without findings to suggest acute cholecystitis; Choledocholithiasis with at least 6 or 7 sub 6 mm calculi in a mildly dilated common bile duct; suspect resolving pancreatitis. Patient is scheduled for repeat attempt at ERCP on 11/12 at 11:45 AM with Dr. Rush Landmark. We will follow up after this ERCP for timing of lap chole.  Wellington Hampshire, [facility] 06/13/2020, 10:40 AM Please see Amion for pager number during day hours 7:00am-4:30pm

## 2020-06-13 NOTE — Assessment & Plan Note (Addendum)
-   s/p PTCA on 10/25 for in-stent restenosis. Was discharged on [place] on hold for procedures/surgery; okay to resume on 11/15 per surgery - continue asa, lopressor - statin on hold for now

## 2020-06-13 NOTE — Assessment & Plan Note (Addendum)
-  Patient presented with epigastric pain.  Technical difficulty with ERCP.  MRCP was repeated on 06/12/2020 which reveals cholelithiasis, choledocholithiasis with multiple calculi seen within dilated CBD - repeat ERCP done on 11/12; stones successfully removed - now s/p lap CCY on 11/13 - patient weak and run down this am; no fever; mild not unexpected rise in LFTs per surgery -LFTs improved on day of discharge.  Also tolerating diet better with no nausea or vomiting

## 2020-06-13 NOTE — Hospital Course (Addendum)
Mr. Hallum is a 74 year old male with history of coronary disease status post recent left heart cath (05/28/20) with successful PTCA to distal SVG to diagonal in-stent restenosis (on asa/brilinta PTA), diabetes type 2, CKD3b, hypertension, HIV, chronic diastolic congestive heart failure, BPH who presented to the emergency department with complaints of abdominal pain mainly in the epigastric region and right upper quadrant.   In the emergency department, he also had fever and was tachycardic.  Patient was found to have elevated liver enzymes on presentation, elevated bilirubin, elevated lactate, elevated troponin.   CT abdomen/pelvis showed cholelithiasis, multiple stones in the distal CBD resulting in mild CBD dilation. Patient was admitted for the management of sepsis secondary to cholangitis.  GI, general surgery, cardiology following.    He was also positive for E. coli bacteremia which was presumed translocation from GI source.  He was treated with Rocephin during hospitalization.  Unsuccessful ERCP on 06/11/20 but stent placed in pancreatic duct.   Repeat ERCP on 11/12 was successful and stones were removed. He then underwent a lap CCY on 06/16/20.  He recovered well after surgery.  Diet was slowly advanced and he tolerated well.  He was discharged with a course of Keflex to complete for his bacteremia at time of discharge.

## 2020-06-13 NOTE — Assessment & Plan Note (Signed)
-   continue SSI and CBG monitoring  

## 2020-06-13 NOTE — Plan of Care (Signed)

## 2020-06-13 NOTE — Progress Notes (Signed)
PROGRESS NOTE    [name]   LMB:[id number]  DOB: 1946-03-05  DOA: 06/07/2020     6  PCP: Dorothyann Peng, NP  CC: abdominal pain  Hospital Course: [name] is a 74 year old male with history of coronary disease status post recent left heart cath (05/28/20) with successful PTCA to distal SVG to diagonal in-stent restenosis (on asa/brilinta PTA), diabetes type 2, CKD3b, hypertension, HIV, chronic diastolic congestive heart failure, BPH who presented to the emergency department with complaints of abdominal pain mainly in the epigastric region and right upper quadrant.   In the emergency department, he also had fever and was tachycardic.  Patient was found to have elevated liver enzymes on presentation, elevated bilirubin, elevated lactate, elevated troponin.   CT abdomen/pelvis showed cholelithiasis, multiple stones in the distal CBD resulting in mild CBD dilation. Patient was admitted for the management of sepsis secondary to cholangitis.  GI, general surgery, cardiology following.   Unsuccessful ERCP on 06/11/20 but stent placed in pancreatic duct.  Plan is to repeat ERCP on 11/12 followed by [facility] possibly day after.  He was also positive for E. coli bacteremia which was presumed translocation from GI source.  He was treated with Rocephin during hospitalization.   Interval History:  No events overnight.  Denies any further abdominal pain; no nausea/vomiting.  Denies any chest pain or shortness of breath.  Understands plan is for repeat ERCP tentatively for Friday.  Old records reviewed in assessment of this patient  ROS: Constitutional: negative for chills and fevers, Respiratory: negative for cough, Cardiovascular: negative for chest pain and Gastrointestinal: negative for abdominal pain, constipation and diarrhea  Assessment & Plan: * Severe sepsis (HCC) Fever, tachycardia, lactic acidosis, AKI, leukocytosis.  Work-up consistent with choledocholithiasis and cholangitis.  Also  developed E. coli bacteremia from presumed translocation of GI source -Continue Rocephin; will complete 2-week course total -If fever, will need repeat blood cultures  Choledocholithiasis -Patient presented with epigastric pain.  Technical difficulty with ERCP.  MRCP was repeated on 06/12/2020 which reveals cholelithiasis, choledocholithiasis with multiple calculi seen within dilated CBD -Tentative plan for repeat ERCP on 06/15/2020.  Patient will then need cholecystectomy  Chronic diastolic CHF (congestive heart failure) (HCC) - lasix on hold.   - On gentle IV fluids due to concern for sepsis, dehydration and AKI.  Continue current medications.Echo done on this admission showed ejection fraction of 60 to 65%, no wall motion abnormality  Coronary artery disease - s/p PTCA on 10/25 for in-stent restenosis. Was discharged on asa/brilinta - brilinta on hold for procedures/surgery - continue asa, lopressor - statin on hold for now   Type 2 diabetes mellitus with stage 3b chronic kidney disease, without long-term current use of insulin (HCC) - continue SSI and CBG monitoring   Acute renal failure superimposed on stage 3b chronic kidney disease ([place]) Presented with creatinine in the range of 2.  Baseline creatinine ranges from 1.5-1.8.  Kidney function improved with IV fluids.  HIV disease ([place]) - continue home meds    Antimicrobials: Rocephin 06/07/2020>> present  DVT prophylaxis: SCD Code Status: Full Family Communication: None present Disposition Plan: Status is: Inpatient  Remains inpatient appropriate because:Ongoing diagnostic testing needed not appropriate for outpatient work up, Unsafe d/c plan, IV treatments appropriate due to intensity of illness or inability to take PO and Inpatient level of care appropriate due to severity of illness   Dispo: The patient is from: Home  Anticipated d/c is to: Home              Anticipated d/c date is: > 3 days               Patient currently is not medically stable to d/c.       Objective: Blood pressure (!) 158/70, pulse 62, temperature 98.3 F (36.8 C), temperature source Oral, resp. rate 20, height 5\' 3"  (1.6 m), weight 61.2 kg, SpO2 98 %.  Examination: General appearance: alert, cooperative and no distress Head: Normocephalic, without obvious abnormality, atraumatic Eyes: EOMI Lungs: clear to auscultation bilaterally Heart: regular rate and rhythm and S1, S2 normal Abdomen: normal findings: bowel sounds normal and soft, non-tender Extremities: No edema Skin: mobility and turgor normal Neurologic: Grossly normal  Consultants:   GI  Surgery  Cardiology  Procedures:   06/11/2020: ERCP  Data Reviewed: I have personally reviewed following labs and imaging studies Results for orders placed or performed during the hospital encounter of 06/07/20 (from the past 24 hour(s))  Glucose, capillary     Status: Abnormal   Collection Time: 06/12/20  5:00 PM  Result Value Ref Range   Glucose-Capillary 122 (H) 70 - 99 mg/dL  Glucose, capillary     Status: Abnormal   Collection Time: 06/12/20  9:25 PM  Result Value Ref Range   Glucose-Capillary 208 (H) 70 - 99 mg/dL  Glucose, capillary     Status: Abnormal   Collection Time: 06/13/20  7:45 AM  Result Value Ref Range   Glucose-Capillary 128 (H) 70 - 99 mg/dL  Glucose, capillary     Status: Abnormal   Collection Time: 06/13/20 12:10 PM  Result Value Ref Range   Glucose-Capillary 142 (H) 70 - 99 mg/dL   *Note: Due to a large number of results and/or encounters for the requested time period, some results have not been displayed. A complete set of results can be found in Results Review.    Recent Results (from the past 240 hour(s))  Respiratory Panel by RT PCR (Flu A&B, Covid) - Nasopharyngeal Swab     Status: None   Collection Time: 06/07/20  5:51 AM   Specimen: Nasopharyngeal Swab  Result Value Ref Range Status   SARS Coronavirus 2 by RT PCR  NEGATIVE NEGATIVE Final    Comment: (NOTE) SARS-CoV-2 target nucleic acids are NOT DETECTED.  The SARS-CoV-2 RNA is generally detectable in upper respiratoy specimens during the acute phase of infection. The lowest concentration of SARS-CoV-2 viral copies this assay can detect is 131 copies/mL. A negative result does not preclude SARS-Cov-2 infection and should not be used as the sole basis for treatment or other patient management decisions. A negative result may occur with  improper specimen collection/handling, submission of specimen other than nasopharyngeal swab, presence of viral mutation(s) within the areas targeted by this assay, and inadequate number of viral copies (<131 copies/mL). A negative result must be combined with clinical observations, patient history, and epidemiological information. The expected result is Negative.  Fact Sheet for Patients:  PinkCheek.be  Fact Sheet for Healthcare Providers:  GravelBags.it  This test is no t yet approved or cleared by the [place] FDA and  has been authorized for detection and/or diagnosis of SARS-CoV-2 by FDA under an Emergency Use Authorization (EUA). This EUA will remain  in effect (meaning this test can be used) for the duration of the COVID-19 declaration under Section 564(b)(1) of the Act, 21 U.S.C. section 360bbb-3(b)(1), unless the authorization is terminated or  revoked sooner.     Influenza A by PCR NEGATIVE NEGATIVE Final   Influenza B by PCR NEGATIVE NEGATIVE Final    Comment: (NOTE) The Xpert Xpress SARS-CoV-2/FLU/RSV assay is intended as an aid in  the diagnosis of influenza from Nasopharyngeal swab specimens and  should not be used as a sole basis for treatment. Nasal washings and  aspirates are unacceptable for Xpert Xpress SARS-CoV-2/FLU/RSV  testing.  Fact Sheet for Patients: PinkCheek.be  Fact Sheet for  Healthcare Providers: GravelBags.it  This test is not yet approved or cleared by the [place] FDA and  has been authorized for detection and/or diagnosis of SARS-CoV-2 by  FDA under an Emergency Use Authorization (EUA). This EUA will remain  in effect (meaning this test can be used) for the duration of the  Covid-19 declaration under Section 564(b)(1) of the Act, 21  U.S.C. section 360bbb-3(b)(1), unless the authorization is  terminated or revoked. Performed at [facility], [place] [address] [place], [place] [address]   Blood culture (routine single)     Status: Abnormal   Collection Time: 06/07/20  5:52 AM   Specimen: BLOOD  Result Value Ref Range Status   Specimen Description BLOOD SITE NOT SPECIFIED  Final   Special Requests   Final    BOTTLES DRAWN AEROBIC AND ANAEROBIC Blood Culture adequate volume   Culture  Setup Time   Final    GRAM NEGATIVE RODS IN BOTH AEROBIC AND ANAEROBIC BOTTLES CRITICAL RESULT CALLED TO, READ BACK BY AND VERIFIED WITH: PHARMD G ABBOTT 06/07/20 AT 2313 SK Performed at [facility], [place] [address] [place], [place] [address]    Culture ESCHERICHIA COLI (A)  Final   Report Status 06/09/2020 FINAL  Final   Organism ID, Bacteria ESCHERICHIA COLI  Final      Susceptibility   Escherichia coli - MIC*    AMPICILLIN <=2 SENSITIVE Sensitive     CEFAZOLIN <=4 SENSITIVE Sensitive     CEFEPIME <=0.12 SENSITIVE Sensitive     CEFTAZIDIME <=1 SENSITIVE Sensitive     CEFTRIAXONE <=0.25 SENSITIVE Sensitive     CIPROFLOXACIN <=0.25 SENSITIVE Sensitive     GENTAMICIN <=1 SENSITIVE Sensitive     IMIPENEM <=0.25 SENSITIVE Sensitive     TRIMETH/SULFA <=20 SENSITIVE Sensitive     AMPICILLIN/SULBACTAM <=2 SENSITIVE Sensitive     PIP/TAZO <=4 SENSITIVE Sensitive     * ESCHERICHIA COLI  Blood Culture ID Panel (Reflexed)     Status: Abnormal   Collection Time: 06/07/20  5:52 AM  Result Value Ref Range Status   Enterococcus  faecalis NOT DETECTED NOT DETECTED Final   Enterococcus Faecium NOT DETECTED NOT DETECTED Final   Listeria monocytogenes NOT DETECTED NOT DETECTED Final   Staphylococcus species NOT DETECTED NOT DETECTED Final   Staphylococcus aureus (BCID) NOT DETECTED NOT DETECTED Final   Staphylococcus epidermidis NOT DETECTED NOT DETECTED Final   Staphylococcus lugdunensis NOT DETECTED NOT DETECTED Final   Streptococcus species NOT DETECTED NOT DETECTED Final   Streptococcus agalactiae NOT DETECTED NOT DETECTED Final   Streptococcus pneumoniae NOT DETECTED NOT DETECTED Final   Streptococcus pyogenes NOT DETECTED NOT DETECTED Final   A.calcoaceticus-baumannii NOT DETECTED NOT DETECTED Final   Bacteroides fragilis NOT DETECTED NOT DETECTED Final   Enterobacterales DETECTED (A) NOT DETECTED Final    Comment: Enterobacterales represent a large order of gram negative bacteria, not a single organism. CRITICAL RESULT CALLED TO, READ BACK BY AND VERIFIED WITH: PHARMD G ABBOTT 06/07/20 AT 2313  SK    Enterobacter cloacae complex NOT DETECTED NOT DETECTED Final   Escherichia coli DETECTED (A) NOT DETECTED Final    Comment: CRITICAL RESULT CALLED TO, READ BACK BY AND VERIFIED WITH: PHARMD G ABBOTT 06/07/20 AT 2313 SK    Klebsiella aerogenes NOT DETECTED NOT DETECTED Final   Klebsiella oxytoca NOT DETECTED NOT DETECTED Final   Klebsiella pneumoniae NOT DETECTED NOT DETECTED Final   Proteus species NOT DETECTED NOT DETECTED Final   Salmonella species NOT DETECTED NOT DETECTED Final   Serratia marcescens NOT DETECTED NOT DETECTED Final   Haemophilus influenzae NOT DETECTED NOT DETECTED Final   Neisseria meningitidis NOT DETECTED NOT DETECTED Final   Pseudomonas aeruginosa NOT DETECTED NOT DETECTED Final   Stenotrophomonas maltophilia NOT DETECTED NOT DETECTED Final   Candida albicans NOT DETECTED NOT DETECTED Final   Candida auris NOT DETECTED NOT DETECTED Final   Candida glabrata NOT DETECTED NOT DETECTED  Final   Candida krusei NOT DETECTED NOT DETECTED Final   Candida parapsilosis NOT DETECTED NOT DETECTED Final   Candida tropicalis NOT DETECTED NOT DETECTED Final   Cryptococcus neoformans/gattii NOT DETECTED NOT DETECTED Final   CTX-M ESBL NOT DETECTED NOT DETECTED Final   Carbapenem resistance IMP NOT DETECTED NOT DETECTED Final   Carbapenem resistance KPC NOT DETECTED NOT DETECTED Final   Carbapenem resistance NDM NOT DETECTED NOT DETECTED Final   Carbapenem resist OXA 48 LIKE NOT DETECTED NOT DETECTED Final   Carbapenem resistance VIM NOT DETECTED NOT DETECTED Final    Comment: Performed at [facility], [address] [place], [place] [address]  Gastrointestinal Panel by PCR , Stool     Status: None   Collection Time: 06/08/20 10:03 AM   Specimen: Stool  Result Value Ref Range Status   Campylobacter species NOT DETECTED NOT DETECTED Final   Plesimonas shigelloides NOT DETECTED NOT DETECTED Final   Salmonella species NOT DETECTED NOT DETECTED Final   Yersinia enterocolitica NOT DETECTED NOT DETECTED Final   Vibrio species NOT DETECTED NOT DETECTED Final   Vibrio cholerae NOT DETECTED NOT DETECTED Final   Enteroaggregative E coli (EAEC) NOT DETECTED NOT DETECTED Final   Enteropathogenic E coli (EPEC) NOT DETECTED NOT DETECTED Final   Enterotoxigenic E coli (ETEC) NOT DETECTED NOT DETECTED Final   Shiga like toxin producing E coli (STEC) NOT DETECTED NOT DETECTED Final   Shigella/Enteroinvasive E coli (EIEC) NOT DETECTED NOT DETECTED Final   Cryptosporidium NOT DETECTED NOT DETECTED Final   Cyclospora cayetanensis NOT DETECTED NOT DETECTED Final   Entamoeba histolytica NOT DETECTED NOT DETECTED Final   Giardia lamblia NOT DETECTED NOT DETECTED Final   Adenovirus F40/41 NOT DETECTED NOT DETECTED Final   Astrovirus NOT DETECTED NOT DETECTED Final   Norovirus GI/GII NOT DETECTED NOT DETECTED Final   Rotavirus A NOT DETECTED NOT DETECTED Final   Sapovirus (I, II, IV, and V)  NOT DETECTED NOT DETECTED Final    Comment: Performed at [facility], [address], [place], [place] [address]     Radiology Studies: MR ABDOMEN MRCP W WO CONTAST  Result Date: 06/13/2020 CLINICAL DATA:  Abdominal pain and elevated liver function studies. Choledocholithiasis seen on CT scan. EXAM: MRI ABDOMEN WITHOUT AND WITH CONTRAST (INCLUDING MRCP) TECHNIQUE: Multiplanar multisequence MR imaging of the abdomen was performed both before and after the administration of intravenous contrast. Heavily T2-weighted images of the biliary and pancreatic ducts were obtained, and three-dimensional MRCP images were rendered by post processing. CONTRAST:  14mL GADAVIST  GADOBUTROL 1 MMOL/ML IV SOLN COMPARISON:  CT scan 06/07/2020 FINDINGS: Lower chest: Streaky bibasilar atelectasis is noted. No pleural effusions or infiltrates. No pericardial effusion. Hepatobiliary: No hepatic lesions are identified. Mild central intrahepatic biliary dilatation. The gallbladder demonstrates small layering gallstones but no wall thickening or pericholecystic fluid to suggest acute cholecystitis. Mild common bile duct dilatation is noted. The duct measures 9 mm in the porta hepatis and 9 mm in the head of the pancreas. There are several small common bile duct stones estimated at 6 or 7. These all measure less than 6 mm. Pancreas: There may be some mild residual inflammation of the pancreas on the T2 weighted images and there is a small amount of fluid in the lesser sac between the pancreas and stomach. Normal pancreatic enhancement without findings for pancreatic necrosis. No pancreatic mass. Suspect resolving pancreatitis. Spleen:  Within normal limits in size. No splenic lesions. Adrenals/Urinary Tract: The adrenal glands and kidneys are unremarkable. No worrisome renal lesions Stomach/Bowel: The stomach, duodenum, visualized small bowel and visualized colon are grossly normal but the study is somewhat limited by motion  artifact. Duodenal diverticulum noted near the pancreatic head. Vascular/Lymphatic: The aorta and branch vessels are patent. No aneurysm or dissection. No mesenteric or retroperitoneal adenopathy. Other: Small amount of fluid in the lesser sac but no other abdominal fluid collections are identified. Musculoskeletal: No significant bony findings. IMPRESSION: 1. Cholelithiasis without MR findings to suggest acute cholecystitis. 2. Choledocholithiasis with at least 6 or 7 sub 6 mm calculi in a mildly dilated common bile duct. 3. Suspect resolving pancreatitis. 4. Streaky bibasilar atelectasis. Electronically Signed   By: Marijo Sanes M.D.   On: 06/13/2020 05:47   MR ABDOMEN MRCP W WO CONTAST  Final Result    DG Abd 1 View - KUB  Final Result    DG C-Arm 1-60 Min  Final Result    CT ABDOMEN PELVIS WO CONTRAST  Final Result    US Abdomen Limited RUQ (LIVER/GB)  Final Result    DG Chest 2 View  Final Result      Scheduled Meds: . acidophilus  1 capsule Oral Q breakfast  . aspirin EC  81 mg Oral Daily  . B-complex with vitamin C  1 tablet Oral Q breakfast  . cholecalciferol  2,000 Units Oral q AM  . dolutegravir  50 mg Oral q AM  . doravirine  100 mg Oral q AM  . insulin aspart  0-15 Units Subcutaneous TID WC  . isosorbide mononitrate  30 mg Oral Daily  . metoprolol tartrate  12.5 mg Oral BID  . pantoprazole  40 mg Oral QAC breakfast  . potassium chloride SA  20 mEq Oral Daily  . sodium bicarbonate  650 mg Oral TID  . tamsulosin  0.4 mg Oral Daily  . valACYclovir  500 mg Oral q AM   PRN Meds: labetalol, loperamide, nitroGLYCERIN, ondansetron **OR** ondansetron (ZOFRAN) IV Continuous Infusions: . cefTRIAXone (ROCEPHIN)  IV 2 g (06/12/20 0926)     LOS: 6 days  Time spent: Greater than 50% of the 35 minute visit was spent in counseling/coordination of care for the patient as laid out in the A&P.   Dwyane Dee, MD [facility] 06/13/2020, 1:34 PM

## 2020-06-13 NOTE — Assessment & Plan Note (Addendum)
-   lasix on hold.   - initially on gentle IV fluids due to concern for sepsis, dehydration and AKI.  Continue current medications.Echo done on this admission showed ejection fraction of 60 to 65%, no wall motion abnormality - IVF now off

## 2020-06-13 NOTE — Progress Notes (Signed)
Daily Rounding Note  06/13/2020, 9:44 AM  LOS: 6 days   SUBJECTIVE:   Chief complaint:   choledocholithiasis   Pt feels well.  Tired of hospital stay.  Wondering when GB surgery will occur.  Discussed ERCP plans for Friday w pt.   No abd pain, no N/V, no cp, no SOB  OBJECTIVE:         Vital signs in last 24 hours:    Temp:  [97.7 F (36.5 C)-98.3 F (36.8 C)] 98.3 F (36.8 C) (11/10 0425) Pulse Rate:  [61-62] 62 (11/10 0425) Resp:  [15-20] 20 (11/10 0425) BP: (133-179)/(55-70) 158/70 (11/10 0425) SpO2:  [98 %-99 %] 98 % (11/10 0425) Last BM Date: 06/12/20 Filed Weights   06/07/20 1944  Weight: 61.2 kg   General: looks well   Heart: RRR Chest: clear bil.   Abdomen: soft, NT, ND.  Active BS  Extremities: no CCE Neuro/Psych:  Oriented x 3.  No weakness, no tremors.  Fluid speech.    Intake/Output from previous day: 11/09 0701 - 11/10 0700 In: 240 [P.O.:240] Out: 1025 [Urine:1025]  Intake/Output this shift: No intake/output data recorded.  Lab Results: Recent Labs    06/11/20 0217  WBC 5.5  HGB 11.5*  HCT 34.0*  PLT 164   BMET Recent Labs    06/11/20 0217 06/12/20 0456  NA 138 138  K 3.9 4.4  CL 107 106  CO2 21* 23  GLUCOSE 163* 175*  BUN 22 22  CREATININE 1.61* 1.43*  CALCIUM 8.5* 8.6*   LFT Recent Labs    06/11/20 0217  PROT 5.8*  ALBUMIN 2.4*  AST 72*  ALT 101*  ALKPHOS 73  BILITOT 1.1   PT/INR Recent Labs    06/11/20 0217  LABPROT 14.6  INR 1.2   Hepatitis Panel No results for input(s): HEPBSAG, HCVAB, HEPAIGM, HEPBIGM in the last 72 hours.  Studies/Results: DG Abd 1 View - KUB  Result Date: 06/11/2020 CLINICAL DATA:  History of cholelithiasis.  Unsuccessful ERCP. EXAM: ERCP TECHNIQUE: Multiple spot images obtained with the fluoroscopic device and submitted for interpretation post-procedure. FLUOROSCOPY TIME:  23 minutes, 56 seconds COMPARISON:  CT abdomen and  pelvis-06/07/2020 FINDINGS: Two spot intraoperative fluoroscopic images of the right upper abdominal quadrant during ERCP are provided for review. Initial image demonstrates an ERCP probe overlying the right upper abdominal quadrant. Subsequent image demonstrates apparent cannulation of the pancreatic duct. There is no definitive opacification of the common bile duct. IMPRESSION: ERCP as above. These images were submitted for radiologic interpretation only. Please see the procedural report for the amount of contrast and the fluoroscopy time utilized. Electronically Signed   By: Sandi Mariscal M.D.   On: 06/11/2020 12:09   DG C-Arm 1-60 Min  Result Date: 06/11/2020 CLINICAL DATA:  History of cholelithiasis.  Unsuccessful ERCP. EXAM: ERCP TECHNIQUE: Multiple spot images obtained with the fluoroscopic device and submitted for interpretation post-procedure. FLUOROSCOPY TIME:  23 minutes, 56 seconds COMPARISON:  CT abdomen and pelvis-06/07/2020 FINDINGS: Two spot intraoperative fluoroscopic images of the right upper abdominal quadrant during ERCP are provided for review. Initial image demonstrates an ERCP probe overlying the right upper abdominal quadrant. Subsequent image demonstrates apparent cannulation of the pancreatic duct. There is no definitive opacification of the common bile duct. IMPRESSION: ERCP as above. These images were submitted for radiologic interpretation only. Please see the procedural report for the amount of contrast and the fluoroscopy time utilized. Electronically Signed  By: Sandi Mariscal M.D.   On: 06/11/2020 12:09   MR ABDOMEN MRCP W WO CONTAST  Result Date: 06/13/2020 CLINICAL DATA:  Abdominal pain and elevated liver function studies. Choledocholithiasis seen on CT scan. EXAM: MRI ABDOMEN WITHOUT AND WITH CONTRAST (INCLUDING MRCP) TECHNIQUE: Multiplanar multisequence MR imaging of the abdomen was performed both before and after the administration of intravenous contrast. Heavily  T2-weighted images of the biliary and pancreatic ducts were obtained, and three-dimensional MRCP images were rendered by post processing. CONTRAST:  78mL GADAVIST GADOBUTROL 1 MMOL/ML IV SOLN COMPARISON:  CT scan 06/07/2020 FINDINGS: Lower chest: Streaky bibasilar atelectasis is noted. No pleural effusions or infiltrates. No pericardial effusion. Hepatobiliary: No hepatic lesions are identified. Mild central intrahepatic biliary dilatation. The gallbladder demonstrates small layering gallstones but no wall thickening or pericholecystic fluid to suggest acute cholecystitis. Mild common bile duct dilatation is noted. The duct measures 9 mm in the porta hepatis and 9 mm in the head of the pancreas. There are several small common bile duct stones estimated at 6 or 7. These all measure less than 6 mm. Pancreas: There may be some mild residual inflammation of the pancreas on the T2 weighted images and there is a small amount of fluid in the lesser sac between the pancreas and stomach. Normal pancreatic enhancement without findings for pancreatic necrosis. No pancreatic mass. Suspect resolving pancreatitis. Spleen:  Within normal limits in size. No splenic lesions. Adrenals/Urinary Tract: The adrenal glands and kidneys are unremarkable. No worrisome renal lesions Stomach/Bowel: The stomach, duodenum, visualized small bowel and visualized colon are grossly normal but the study is somewhat limited by motion artifact. Duodenal diverticulum noted near the pancreatic head. Vascular/Lymphatic: The aorta and branch vessels are patent. No aneurysm or dissection. No mesenteric or retroperitoneal adenopathy. Other: Small amount of fluid in the lesser sac but no other abdominal fluid collections are identified. Musculoskeletal: No significant bony findings. IMPRESSION: 1. Cholelithiasis without MR findings to suggest acute cholecystitis. 2. Choledocholithiasis with at least 6 or 7 sub 6 mm calculi in a mildly dilated common bile duct.  3. Suspect resolving pancreatitis. 4. Streaky bibasilar atelectasis. Electronically Signed   By: Marijo Sanes M.D.   On: 06/13/2020 05:47    ASSESMENT:   *   Choledocholithiasis. Unsuccessful ERCP 11/8.  Dr. Henrene Pastor unable to access CBD but suspected choledocholithiasis.  Pancreatic duct stent placed. LFTs continue study improvement, question passed stone. 06/12/20 MRCP: Cholelithiasis without cholecystitis.  Choledocholithiasis, 9 mm CBD.  Suspect resolving pancreatitis. Interesting that on CT of 11/4 there was no pancreatitis, though the lipase was 66 >> 88 pre ERCP.  Clinically had no sxs s/o post ERCP pancreatitis.    *    E coli bacteremia, cholangitis.  Rocephin day 7  *    Cholelithiasis.  Surgery on board.  Timing of lap chole TBD.  *    CAD.  Hx CABG, non-STEMI's, stents/PCI's.  10/25 PCI to in-stent restenosis at time of non-STEMI.  Restarted on Brilinta but this is currently on hold to allow for biliary procedure.  Last dose was 11/3.  * HIV. Compliant w all meds.   * DM2.   * CKD 3.  * Normocytic anemia..  Hgb stable.    * Adenomatous colon polyps, hemorrhoids 2013, no polyps on repeat colon 11/2019.     PLAN   *    Patient set for repeat attempt at ERCP on 11/12 at 11:45 AM with Dr. Rush Landmark.     Carb mod diet now  but npo midnight on Thursday/friday Leave Rocephin and Eliquis hold in place.       Azucena Freed  06/13/2020, 9:44 AM Phone [phone]

## 2020-06-14 DIAGNOSIS — A419 Sepsis, unspecified organism: Secondary | ICD-10-CM | POA: Diagnosis not present

## 2020-06-14 DIAGNOSIS — K8050 Calculus of bile duct without cholangitis or cholecystitis without obstruction: Secondary | ICD-10-CM

## 2020-06-14 DIAGNOSIS — R6520 Severe sepsis without septic shock: Secondary | ICD-10-CM

## 2020-06-14 DIAGNOSIS — R7881 Bacteremia: Secondary | ICD-10-CM

## 2020-06-14 LAB — CBC WITH DIFFERENTIAL/PLATELET
Abs Immature Granulocytes: 0.14 10*3/uL — ABNORMAL HIGH (ref 0.00–0.07)
Basophils Absolute: 0 10*3/uL (ref 0.0–0.1)
Basophils Relative: 1 %
Eosinophils Absolute: 0.4 10*3/uL (ref 0.0–0.5)
Eosinophils Relative: 6 %
HCT: 34.2 % — ABNORMAL LOW (ref 39.0–52.0)
Hemoglobin: 11.4 g/dL — ABNORMAL LOW (ref 13.0–17.0)
Immature Granulocytes: 2 %
Lymphocytes Relative: 14 %
Lymphs Abs: 0.8 10*3/uL (ref 0.7–4.0)
MCH: 31.8 pg (ref 26.0–34.0)
MCHC: 33.3 g/dL (ref 30.0–36.0)
MCV: 95.5 fL (ref 80.0–100.0)
Monocytes Absolute: 0.5 10*3/uL (ref 0.1–1.0)
Monocytes Relative: 9 %
Neutro Abs: 4.1 10*3/uL (ref 1.7–7.7)
Neutrophils Relative %: 68 %
Platelets: 221 10*3/uL (ref 150–400)
RBC: 3.58 MIL/uL — ABNORMAL LOW (ref 4.22–5.81)
RDW: 13.6 % (ref 11.5–15.5)
WBC: 6 10*3/uL (ref 4.0–10.5)
nRBC: 0 % (ref 0.0–0.2)

## 2020-06-14 LAB — COMPREHENSIVE METABOLIC PANEL
ALT: 91 U/L — ABNORMAL HIGH (ref 0–44)
AST: 55 U/L — ABNORMAL HIGH (ref 15–41)
Albumin: 2.5 g/dL — ABNORMAL LOW (ref 3.5–5.0)
Alkaline Phosphatase: 75 U/L (ref 38–126)
Anion gap: 10 (ref 5–15)
BUN: 24 mg/dL — ABNORMAL HIGH (ref 8–23)
CO2: 21 mmol/L — ABNORMAL LOW (ref 22–32)
Calcium: 8.3 mg/dL — ABNORMAL LOW (ref 8.9–10.3)
Chloride: 106 mmol/L (ref 98–111)
Creatinine, Ser: 1.61 mg/dL — ABNORMAL HIGH (ref 0.61–1.24)
GFR, Estimated: 45 mL/min — ABNORMAL LOW (ref >60)
Glucose, Bld: 213 mg/dL — ABNORMAL HIGH (ref 70–99)
Potassium: 3.9 mmol/L (ref 3.5–5.1)
Sodium: 137 mmol/L (ref 135–145)
Total Bilirubin: 1 mg/dL (ref 0.3–1.2)
Total Protein: 5.7 g/dL — ABNORMAL LOW (ref 6.5–8.1)

## 2020-06-14 LAB — GLUCOSE, CAPILLARY
Glucose-Capillary: 144 mg/dL — ABNORMAL HIGH (ref 70–99)
Glucose-Capillary: 251 mg/dL — ABNORMAL HIGH (ref 70–99)
Glucose-Capillary: 89 mg/dL (ref 70–99)
Glucose-Capillary: 193 mg/dL — ABNORMAL HIGH (ref 70–99)

## 2020-06-14 LAB — MAGNESIUM: Magnesium: 2 mg/dL (ref 1.7–2.4)

## 2020-06-14 MED ORDER — CEFAZOLIN SODIUM-DEXTROSE 2-4 GM/100ML-% IV SOLN
2.0000 g | Freq: Two times a day (BID) | INTRAVENOUS | Status: DC
  Administered 2020-06-15 – 2020-06-18 (×6): 2 g via INTRAVENOUS
  Filled 2020-06-14 (×6): qty 100

## 2020-06-14 NOTE — Progress Notes (Signed)
He has pan sens e.coli bacteremia. D/w Dr Sabino Gasser and we will optimize his ceftriaxone to cefazolin 2g IV q12  Onnie Boer, PharmD, [place], AAHIVP, CPP Infectious Disease Pharmacist 06/14/2020 1:15 PM

## 2020-06-14 NOTE — Consult Note (Signed)
   [facility] Inpatient Consult   06/14/2020  ARTHOR GORTER [birth year] [id number]   [facility] [ACO] Patient:  Medicare NextGen   Patient screened for high risk score for unplanned readmission risk and for less than 30 days readmission hospitalization to check if potential [place] Management service needs.  Review of patient's medical record reveals patient is scheduled for procedures noted.  Primary Care Provider is listed with Dorothyann Peng, NP with [facility] and this provider is listed to provide the transition of care [TOC] for post hospital follow up.   Plan: Continue to follow progress and disposition to assess for post hospital care management needs for complex disease management.    Please place a [facility] Care Management consult as appropriate and for questions contact:   Natividad Brood, RN BSN [facility] Liaison  [phone] business mobile phone Toll free office [phone]  Fax number: [phone] [email or web address]

## 2020-06-14 NOTE — Progress Notes (Signed)
     [facility] Progress Note  CC:  Choledocholithiasis  Subjective:  No new complaints.  Feels fine.  No abdominal pain, nausea, vomiting.  Objective:  Vital signs in last 24 hours: Temp:  [97.7 F (36.5 C)-98.3 F (36.8 C)] 98.3 F (36.8 C) (11/11 0515) Pulse Rate:  [57-67] 57 (11/11 0515) Resp:  [15-23] 15 (11/11 0515) BP: (120-170)/(62-98) 170/62 (11/11 0515) SpO2:  [97 %-98 %] 97 % (11/11 0515) Last BM Date: 06/14/20 General:  Alert, Well-developed, in NAD  Intake/Output from previous day: 11/10 0701 - 11/11 0700 In: 300 [P.O.:300] Out: 850 [Urine:850]  Lab Results: Recent Labs    06/14/20 0139  WBC 6.0  HGB 11.4*  HCT 34.2*  PLT 221   BMET Recent Labs    06/12/20 0456 06/14/20 0139  NA 138 137  K 4.4 3.9  CL 106 106  CO2 23 21*  GLUCOSE 175* 213*  BUN 22 24*  CREATININE 1.43* 1.61*  CALCIUM 8.6* 8.3*   LFT Recent Labs    06/14/20 0139  PROT 5.7*  ALBUMIN 2.5*  AST 55*  ALT 91*  ALKPHOS 75  BILITOT 1.0   Assessment / Plan: *Choledocholithiasis. Unsuccessful ERCP 11/8. Dr. Henrene Pastor unable to access CBD but suspected choledocholithiasis. Pancreatic duct stent placed. LFTs continue to show improvement,question passedstone. 06/12/20 MRCP: Cholelithiasis without cholecystitis.  Choledocholithiasis, 9 mm CBD.  Suspect resolving pancreatitis. Interesting that on CT of 11/4 there was no pancreatitis, though the lipase was 66 >> 88 pre ERCP.  Clinically had no sxs s/o post ERCP pancreatitis.    *E coli bacteremia,cholangitis. Rocephin day 8  *Cholelithiasis. Surgery on board. Timing of lap choleTBD.  *CAD.HxCABG, non-STEMI's, stents/PCI's.10/25PCI to in-stent restenosis at time of non-STEMI. Restarted on Brilinta but this is currently on hold to allow for biliary procedure. Last dose was 11/3.  * HIV. Compliant w all meds.   * DM2.   * CKD 3.  * Normocytic anemia.. Hgb stable.    * Adenomatous colon polyps, hemorrhoids 2013, no polyps on repeat colon 11/2019.   -ERCP tomorrow, 11/12 with Dr. Rush Landmark.  Continue Rocephin.  Continue to hold Eliquis.  NPO after midnight.    LOS: 7 days   Laban Emperor. Demara Lover  06/14/2020, 9:09 AM

## 2020-06-14 NOTE — Plan of Care (Signed)

## 2020-06-14 NOTE — H&P (View-Only) (Signed)
     [facility] Progress Note  CC:  Choledocholithiasis  Subjective:  No new complaints.  Feels fine.  No abdominal pain, nausea, vomiting.  Objective:  Vital signs in last 24 hours: Temp:  [97.7 F (36.5 C)-98.3 F (36.8 C)] 98.3 F (36.8 C) (11/11 0515) Pulse Rate:  [57-67] 57 (11/11 0515) Resp:  [15-23] 15 (11/11 0515) BP: (120-170)/(62-98) 170/62 (11/11 0515) SpO2:  [97 %-98 %] 97 % (11/11 0515) Last BM Date: 06/14/20 General:  Alert, Well-developed, in NAD  Intake/Output from previous day: 11/10 0701 - 11/11 0700 In: 300 [P.O.:300] Out: 850 [Urine:850]  Lab Results: Recent Labs    06/14/20 0139  WBC 6.0  HGB 11.4*  HCT 34.2*  PLT 221   BMET Recent Labs    06/12/20 0456 06/14/20 0139  NA 138 137  K 4.4 3.9  CL 106 106  CO2 23 21*  GLUCOSE 175* 213*  BUN 22 24*  CREATININE 1.43* 1.61*  CALCIUM 8.6* 8.3*   LFT Recent Labs    06/14/20 0139  PROT 5.7*  ALBUMIN 2.5*  AST 55*  ALT 91*  ALKPHOS 75  BILITOT 1.0   Assessment / Plan: *Choledocholithiasis. Unsuccessful ERCP 11/8. Dr. Henrene Pastor unable to access CBD but suspected choledocholithiasis. Pancreatic duct stent placed. LFTs continue to show improvement,question passedstone. 06/12/20 MRCP: Cholelithiasis without cholecystitis.  Choledocholithiasis, 9 mm CBD.  Suspect resolving pancreatitis. Interesting that on CT of 11/4 there was no pancreatitis, though the lipase was 66 >> 88 pre ERCP.  Clinically had no sxs s/o post ERCP pancreatitis.    *E coli bacteremia,cholangitis. Rocephin day 8  *Cholelithiasis. Surgery on board. Timing of lap choleTBD.  *CAD.HxCABG, non-STEMI's, stents/PCI's.10/25PCI to in-stent restenosis at time of non-STEMI. Restarted on Brilinta but this is currently on hold to allow for biliary procedure. Last dose was 11/3.  * HIV. Compliant w all meds.   * DM2.   * CKD 3.  * Normocytic anemia.. Hgb stable.    * Adenomatous colon polyps, hemorrhoids 2013, no polyps on repeat colon 11/2019.   -ERCP tomorrow, 11/12 with Dr. Rush Landmark.  Continue Rocephin.  Continue to hold Eliquis.  NPO after midnight.    LOS: 7 days   Laban Emperor. Michaelle Bottomley  06/14/2020, 9:09 AM

## 2020-06-14 NOTE — Progress Notes (Signed)
PROGRESS NOTE    [name]   STM:[id number]  DOB: 19-Mar-1946  DOA: 06/07/2020     7  PCP: Dorothyann Peng, NP  CC: abdominal pain  Hospital Course: [name] is a 74 year old male with history of coronary disease status post recent left heart cath (05/28/20) with successful PTCA to distal SVG to diagonal in-stent restenosis (on asa/brilinta PTA), diabetes type 2, CKD3b, hypertension, HIV, chronic diastolic congestive heart failure, BPH who presented to the emergency department with complaints of abdominal pain mainly in the epigastric region and right upper quadrant.   In the emergency department, he also had fever and was tachycardic.  Patient was found to have elevated liver enzymes on presentation, elevated bilirubin, elevated lactate, elevated troponin.   CT abdomen/pelvis showed cholelithiasis, multiple stones in the distal CBD resulting in mild CBD dilation. Patient was admitted for the management of sepsis secondary to cholangitis.  GI, general surgery, cardiology following.   Unsuccessful ERCP on 06/11/20 but stent placed in pancreatic duct.  Plan is to repeat ERCP on 11/12 followed by [facility] possibly day after.  He was also positive for E. coli bacteremia which was presumed translocation from GI source.  He was treated with Rocephin during hospitalization.   Interval History:  No events overnight.  Still feels okay. Denies N/V, abd pain.   Old records reviewed in assessment of this patient  ROS: Constitutional: negative for chills and fevers, Respiratory: negative for cough, Cardiovascular: negative for chest pain and Gastrointestinal: negative for abdominal pain, constipation and diarrhea  Assessment & Plan: * Severe sepsis (HCC)-resolved as of 06/14/2020 Fever, tachycardia, lactic acidosis, AKI, leukocytosis.  Work-up consistent with choledocholithiasis and cholangitis.  Also developed E. coli bacteremia from presumed translocation of GI source -Continue Rocephin; will  complete 2-week course total -If fever, will need repeat blood cultures  Choledocholithiasis -Patient presented with epigastric pain.  Technical difficulty with ERCP.  MRCP was repeated on 06/12/2020 which reveals cholelithiasis, choledocholithiasis with multiple calculi seen within dilated CBD -Tentative plan for repeat ERCP on 06/15/2020.  Patient will then need cholecystectomy  Chronic diastolic CHF (congestive heart failure) (HCC) - lasix on hold.   - initially on gentle IV fluids due to concern for sepsis, dehydration and AKI.  Continue current medications.Echo done on this admission showed ejection fraction of 60 to 65%, no wall motion abnormality - IVF now off  Coronary artery disease - s/p PTCA on 10/25 for in-stent restenosis. Was discharged on asa/brilinta - brilinta on hold for procedures/surgery - continue asa, lopressor - statin on hold for now   Type 2 diabetes mellitus with stage 3b chronic kidney disease, without long-term current use of insulin (HCC) - continue SSI and CBG monitoring   Acute renal failure superimposed on stage 3b chronic kidney disease ([place]) Presented with creatinine in the range of 2.  Baseline creatinine ranges from 1.5-1.8.  Kidney function improved with IV fluids.  HIV disease ([place]) - continue home meds   Antimicrobials: Rocephin 06/07/2020>> present  DVT prophylaxis: SCD Code Status: Full Family Communication: None present Disposition Plan: Status is: Inpatient  Remains inpatient appropriate because:Ongoing diagnostic testing needed not appropriate for outpatient work up, Unsafe d/c plan, IV treatments appropriate due to intensity of illness or inability to take PO and Inpatient level of care appropriate due to severity of illness   Dispo: The patient is from: Home              Anticipated d/c is to: Home  Anticipated d/c date is: > 3 days              Patient currently is not medically stable to d/c.  Objective: Blood  pressure (!) 156/76, pulse 60, temperature 97.6 F (36.4 C), resp. rate 16, height 5\' 3"  (1.6 m), weight 61.2 kg, SpO2 100 %.  Examination: General appearance: alert, cooperative and no distress Head: Normocephalic, without obvious abnormality, atraumatic Eyes: EOMI Lungs: clear to auscultation bilaterally Heart: regular rate and rhythm and S1, S2 normal Abdomen: normal findings: bowel sounds normal and soft, non-tender Extremities: No edema Skin: mobility and turgor normal Neurologic: Grossly normal  Consultants:   GI  Surgery  Cardiology  Procedures:   06/11/2020: ERCP  Data Reviewed: I have personally reviewed following labs and imaging studies Results for orders placed or performed during the hospital encounter of 06/07/20 (from the past 24 hour(s))  Glucose, capillary     Status: Abnormal   Collection Time: 06/13/20  3:41 PM  Result Value Ref Range   Glucose-Capillary 115 (H) 70 - 99 mg/dL  Glucose, capillary     Status: Abnormal   Collection Time: 06/13/20  9:20 PM  Result Value Ref Range   Glucose-Capillary 161 (H) 70 - 99 mg/dL  CBC with Differential/Platelet     Status: Abnormal   Collection Time: 06/14/20  1:39 AM  Result Value Ref Range   WBC 6.0 4.0 - 10.5 K/uL   RBC 3.58 (L) 4.22 - 5.81 MIL/uL   Hemoglobin 11.4 (L) 13.0 - 17.0 g/dL   HCT 34.2 (L) 39 - 52 %   MCV 95.5 80.0 - 100.0 fL   MCH 31.8 26.0 - 34.0 pg   MCHC 33.3 30.0 - 36.0 g/dL   RDW 13.6 11.5 - 15.5 %   Platelets 221 150 - 400 K/uL   nRBC 0.0 0.0 - 0.2 %   Neutrophils Relative % 68 %   Neutro Abs 4.1 1.7 - 7.7 K/uL   Lymphocytes Relative 14 %   Lymphs Abs 0.8 0.7 - 4.0 K/uL   Monocytes Relative 9 %   Monocytes Absolute 0.5 0.1 - 1.0 K/uL   Eosinophils Relative 6 %   Eosinophils Absolute 0.4 0.0 - 0.5 K/uL   Basophils Relative 1 %   Basophils Absolute 0.0 0.0 - 0.1 K/uL   Immature Granulocytes 2 %   Abs Immature Granulocytes 0.14 (H) 0.00 - 0.07 K/uL  Comprehensive metabolic panel      Status: Abnormal   Collection Time: 06/14/20  1:39 AM  Result Value Ref Range   Sodium 137 135 - 145 mmol/L   Potassium 3.9 3.5 - 5.1 mmol/L   Chloride 106 98 - 111 mmol/L   CO2 21 (L) 22 - 32 mmol/L   Glucose, Bld 213 (H) 70 - 99 mg/dL   BUN 24 (H) 8 - 23 mg/dL   Creatinine, Ser 1.61 (H) 0.61 - 1.24 mg/dL   Calcium 8.3 (L) 8.9 - 10.3 mg/dL   Total Protein 5.7 (L) 6.5 - 8.1 g/dL   Albumin 2.5 (L) 3.5 - 5.0 g/dL   AST 55 (H) 15 - 41 U/L   ALT 91 (H) 0 - 44 U/L   Alkaline Phosphatase 75 38 - 126 U/L   Total Bilirubin 1.0 0.3 - 1.2 mg/dL   GFR, Estimated 45 (L) >60 mL/min   Anion gap 10 5 - 15  Magnesium     Status: None   Collection Time: 06/14/20  1:39 AM  Result Value Ref Range  Magnesium 2.0 1.7 - 2.4 mg/dL  Glucose, capillary     Status: Abnormal   Collection Time: 06/14/20  7:35 AM  Result Value Ref Range   Glucose-Capillary 144 (H) 70 - 99 mg/dL  Glucose, capillary     Status: Abnormal   Collection Time: 06/14/20 11:59 AM  Result Value Ref Range   Glucose-Capillary 251 (H) 70 - 99 mg/dL   *Note: Due to a large number of results and/or encounters for the requested time period, some results have not been displayed. A complete set of results can be found in Results Review.    Recent Results (from the past 240 hour(s))  Respiratory Panel by RT PCR (Flu A&B, Covid) - Nasopharyngeal Swab     Status: None   Collection Time: 06/07/20  5:51 AM   Specimen: Nasopharyngeal Swab  Result Value Ref Range Status   SARS Coronavirus 2 by RT PCR NEGATIVE NEGATIVE Final    Comment: (NOTE) SARS-CoV-2 target nucleic acids are NOT DETECTED.  The SARS-CoV-2 RNA is generally detectable in upper respiratoy specimens during the acute phase of infection. The lowest concentration of SARS-CoV-2 viral copies this assay can detect is 131 copies/mL. A negative result does not preclude SARS-Cov-2 infection and should not be used as the sole basis for treatment or other patient management  decisions. A negative result may occur with  improper specimen collection/handling, submission of specimen other than nasopharyngeal swab, presence of viral mutation(s) within the areas targeted by this assay, and inadequate number of viral copies (<131 copies/mL). A negative result must be combined with clinical observations, patient history, and epidemiological information. The expected result is Negative.  Fact Sheet for Patients:  PinkCheek.be  Fact Sheet for Healthcare Providers:  GravelBags.it  This test is no t yet approved or cleared by the [place] FDA and  has been authorized for detection and/or diagnosis of SARS-CoV-2 by FDA under an Emergency Use Authorization (EUA). This EUA will remain  in effect (meaning this test can be used) for the duration of the COVID-19 declaration under Section 564(b)(1) of the Act, 21 U.S.C. section 360bbb-3(b)(1), unless the authorization is terminated or revoked sooner.     Influenza A by PCR NEGATIVE NEGATIVE Final   Influenza B by PCR NEGATIVE NEGATIVE Final    Comment: (NOTE) The Xpert Xpress SARS-CoV-2/FLU/RSV assay is intended as an aid in  the diagnosis of influenza from Nasopharyngeal swab specimens and  should not be used as a sole basis for treatment. Nasal washings and  aspirates are unacceptable for Xpert Xpress SARS-CoV-2/FLU/RSV  testing.  Fact Sheet for Patients: PinkCheek.be  Fact Sheet for Healthcare Providers: GravelBags.it  This test is not yet approved or cleared by the [place] FDA and  has been authorized for detection and/or diagnosis of SARS-CoV-2 by  FDA under an Emergency Use Authorization (EUA). This EUA will remain  in effect (meaning this test can be used) for the duration of the  Covid-19 declaration under Section 564(b)(1) of the Act, 21  U.S.C. section 360bbb-3(b)(1), unless the  authorization is  terminated or revoked. Performed at [facility], [place] [address], [place], [place] [address]   Blood culture (routine single)     Status: Abnormal   Collection Time: 06/07/20  5:52 AM   Specimen: BLOOD  Result Value Ref Range Status   Specimen Description BLOOD SITE NOT SPECIFIED  Final   Special Requests   Final    BOTTLES DRAWN AEROBIC AND ANAEROBIC Blood Culture adequate volume  Culture  Setup Time   Final    GRAM NEGATIVE RODS IN BOTH AEROBIC AND ANAEROBIC BOTTLES CRITICAL RESULT CALLED TO, READ BACK BY AND VERIFIED WITH: PHARMD G ABBOTT 06/07/20 AT 2313 SK Performed at [facility], [address], [place], [place] [address]    Culture ESCHERICHIA COLI (A)  Final   Report Status 06/09/2020 FINAL  Final   Organism ID, Bacteria ESCHERICHIA COLI  Final      Susceptibility   Escherichia coli - MIC*    AMPICILLIN <=2 SENSITIVE Sensitive     CEFAZOLIN <=4 SENSITIVE Sensitive     CEFEPIME <=0.12 SENSITIVE Sensitive     CEFTAZIDIME <=1 SENSITIVE Sensitive     CEFTRIAXONE <=0.25 SENSITIVE Sensitive     CIPROFLOXACIN <=0.25 SENSITIVE Sensitive     GENTAMICIN <=1 SENSITIVE Sensitive     IMIPENEM <=0.25 SENSITIVE Sensitive     TRIMETH/SULFA <=20 SENSITIVE Sensitive     AMPICILLIN/SULBACTAM <=2 SENSITIVE Sensitive     PIP/TAZO <=4 SENSITIVE Sensitive     * ESCHERICHIA COLI  Blood Culture ID Panel (Reflexed)     Status: Abnormal   Collection Time: 06/07/20  5:52 AM  Result Value Ref Range Status   Enterococcus faecalis NOT DETECTED NOT DETECTED Final   Enterococcus Faecium NOT DETECTED NOT DETECTED Final   Listeria monocytogenes NOT DETECTED NOT DETECTED Final   Staphylococcus species NOT DETECTED NOT DETECTED Final   Staphylococcus aureus (BCID) NOT DETECTED NOT DETECTED Final   Staphylococcus epidermidis NOT DETECTED NOT DETECTED Final   Staphylococcus lugdunensis NOT DETECTED NOT DETECTED Final   Streptococcus species NOT DETECTED NOT DETECTED  Final   Streptococcus agalactiae NOT DETECTED NOT DETECTED Final   Streptococcus pneumoniae NOT DETECTED NOT DETECTED Final   Streptococcus pyogenes NOT DETECTED NOT DETECTED Final   A.calcoaceticus-baumannii NOT DETECTED NOT DETECTED Final   Bacteroides fragilis NOT DETECTED NOT DETECTED Final   Enterobacterales DETECTED (A) NOT DETECTED Final    Comment: Enterobacterales represent a large order of gram negative bacteria, not a single organism. CRITICAL RESULT CALLED TO, READ BACK BY AND VERIFIED WITH: PHARMD G ABBOTT 06/07/20 AT 2313 SK    Enterobacter cloacae complex NOT DETECTED NOT DETECTED Final   Escherichia coli DETECTED (A) NOT DETECTED Final    Comment: CRITICAL RESULT CALLED TO, READ BACK BY AND VERIFIED WITH: PHARMD G ABBOTT 06/07/20 AT 2313 SK    Klebsiella aerogenes NOT DETECTED NOT DETECTED Final   Klebsiella oxytoca NOT DETECTED NOT DETECTED Final   Klebsiella pneumoniae NOT DETECTED NOT DETECTED Final   Proteus species NOT DETECTED NOT DETECTED Final   Salmonella species NOT DETECTED NOT DETECTED Final   Serratia marcescens NOT DETECTED NOT DETECTED Final   Haemophilus influenzae NOT DETECTED NOT DETECTED Final   Neisseria meningitidis NOT DETECTED NOT DETECTED Final   Pseudomonas aeruginosa NOT DETECTED NOT DETECTED Final   Stenotrophomonas maltophilia NOT DETECTED NOT DETECTED Final   Candida albicans NOT DETECTED NOT DETECTED Final   Candida auris NOT DETECTED NOT DETECTED Final   Candida glabrata NOT DETECTED NOT DETECTED Final   Candida krusei NOT DETECTED NOT DETECTED Final   Candida parapsilosis NOT DETECTED NOT DETECTED Final   Candida tropicalis NOT DETECTED NOT DETECTED Final   Cryptococcus neoformans/gattii NOT DETECTED NOT DETECTED Final   CTX-M ESBL NOT DETECTED NOT DETECTED Final   Carbapenem resistance IMP NOT DETECTED NOT DETECTED Final   Carbapenem resistance KPC NOT DETECTED NOT DETECTED Final   Carbapenem resistance NDM NOT DETECTED NOT DETECTED  Final   Carbapenem resist OXA 48 LIKE NOT DETECTED NOT DETECTED Final   Carbapenem resistance VIM NOT DETECTED NOT DETECTED Final    Comment: Performed at [facility], [place] [address] [place], [place] [address]  Gastrointestinal Panel by PCR , Stool     Status: None   Collection Time: 06/08/20 10:03 AM   Specimen: Stool  Result Value Ref Range Status   Campylobacter species NOT DETECTED NOT DETECTED Final   Plesimonas shigelloides NOT DETECTED NOT DETECTED Final   Salmonella species NOT DETECTED NOT DETECTED Final   Yersinia enterocolitica NOT DETECTED NOT DETECTED Final   Vibrio species NOT DETECTED NOT DETECTED Final   Vibrio cholerae NOT DETECTED NOT DETECTED Final   Enteroaggregative E coli (EAEC) NOT DETECTED NOT DETECTED Final   Enteropathogenic E coli (EPEC) NOT DETECTED NOT DETECTED Final   Enterotoxigenic E coli (ETEC) NOT DETECTED NOT DETECTED Final   Shiga like toxin producing E coli (STEC) NOT DETECTED NOT DETECTED Final   Shigella/Enteroinvasive E coli (EIEC) NOT DETECTED NOT DETECTED Final   Cryptosporidium NOT DETECTED NOT DETECTED Final   Cyclospora cayetanensis NOT DETECTED NOT DETECTED Final   Entamoeba histolytica NOT DETECTED NOT DETECTED Final   Giardia lamblia NOT DETECTED NOT DETECTED Final   Adenovirus F40/41 NOT DETECTED NOT DETECTED Final   Astrovirus NOT DETECTED NOT DETECTED Final   Norovirus GI/GII NOT DETECTED NOT DETECTED Final   Rotavirus A NOT DETECTED NOT DETECTED Final   Sapovirus (I, II, IV, and V) NOT DETECTED NOT DETECTED Final    Comment: Performed at [facility], [address] [place], [place] [address]     Radiology Studies: MR ABDOMEN MRCP W WO CONTAST  Result Date: 06/13/2020 CLINICAL DATA:  Abdominal pain and elevated liver function studies. Choledocholithiasis seen on CT scan. EXAM: MRI ABDOMEN WITHOUT AND WITH CONTRAST (INCLUDING MRCP) TECHNIQUE: Multiplanar multisequence MR imaging of the abdomen was performed both  before and after the administration of intravenous contrast. Heavily T2-weighted images of the biliary and pancreatic ducts were obtained, and three-dimensional MRCP images were rendered by post processing. CONTRAST:  46mL GADAVIST GADOBUTROL 1 MMOL/ML IV SOLN COMPARISON:  CT scan 06/07/2020 FINDINGS: Lower chest: Streaky bibasilar atelectasis is noted. No pleural effusions or infiltrates. No pericardial effusion. Hepatobiliary: No hepatic lesions are identified. Mild central intrahepatic biliary dilatation. The gallbladder demonstrates small layering gallstones but no wall thickening or pericholecystic fluid to suggest acute cholecystitis. Mild common bile duct dilatation is noted. The duct measures 9 mm in the porta hepatis and 9 mm in the head of the pancreas. There are several small common bile duct stones estimated at 6 or 7. These all measure less than 6 mm. Pancreas: There may be some mild residual inflammation of the pancreas on the T2 weighted images and there is a small amount of fluid in the lesser sac between the pancreas and stomach. Normal pancreatic enhancement without findings for pancreatic necrosis. No pancreatic mass. Suspect resolving pancreatitis. Spleen:  Within normal limits in size. No splenic lesions. Adrenals/Urinary Tract: The adrenal glands and kidneys are unremarkable. No worrisome renal lesions Stomach/Bowel: The stomach, duodenum, visualized small bowel and visualized colon are grossly normal but the study is somewhat limited by motion artifact. Duodenal diverticulum noted near the pancreatic head. Vascular/Lymphatic: The aorta and branch vessels are patent. No aneurysm or dissection. No mesenteric or retroperitoneal adenopathy. Other: Small amount of fluid in the lesser sac but no other abdominal fluid collections are identified. Musculoskeletal: No significant  bony findings. IMPRESSION: 1. Cholelithiasis without MR findings to suggest acute cholecystitis. 2. Choledocholithiasis with  at least 6 or 7 sub 6 mm calculi in a mildly dilated common bile duct. 3. Suspect resolving pancreatitis. 4. Streaky bibasilar atelectasis. Electronically Signed   By: Marijo Sanes M.D.   On: 06/13/2020 05:47   MR ABDOMEN MRCP W WO CONTAST  Final Result    DG Abd 1 View - KUB  Final Result    DG C-Arm 1-60 Min  Final Result    CT ABDOMEN PELVIS WO CONTRAST  Final Result    US Abdomen Limited RUQ (LIVER/GB)  Final Result    DG Chest 2 View  Final Result      Scheduled Meds: . acidophilus  1 capsule Oral Q breakfast  . aspirin EC  81 mg Oral Daily  . B-complex with vitamin C  1 tablet Oral Q breakfast  . cholecalciferol  2,000 Units Oral q AM  . dolutegravir  50 mg Oral q AM  . doravirine  100 mg Oral q AM  . insulin aspart  0-15 Units Subcutaneous TID WC  . isosorbide mononitrate  30 mg Oral Daily  . metoprolol tartrate  12.5 mg Oral BID  . pantoprazole  40 mg Oral QAC breakfast  . potassium chloride SA  20 mEq Oral Daily  . sodium bicarbonate  650 mg Oral TID  . tamsulosin  0.4 mg Oral Daily  . valACYclovir  500 mg Oral q AM   PRN Meds: labetalol, loperamide, nitroGLYCERIN, ondansetron **OR** ondansetron (ZOFRAN) IV Continuous Infusions: . cefTRIAXone (ROCEPHIN)  IV 2 g (06/14/20 0900)     LOS: 7 days  Time spent: Greater than 50% of the 35 minute visit was spent in counseling/coordination of care for the patient as laid out in the A&P.   Dwyane Dee, MD [facility] 06/14/2020, 12:51 PM

## 2020-06-15 ENCOUNTER — Encounter (HOSPITAL_COMMUNITY)
Admission: EM | Disposition: A | Discharge status: Home / Self Care | Payer: Self-pay | Source: Home / Self Care | Attending: Internal Medicine

## 2020-06-15 ENCOUNTER — Inpatient Hospital Stay (HOSPITAL_COMMUNITY): Payer: Medicare Other | Admitting: Certified Registered Nurse Anesthetist

## 2020-06-15 ENCOUNTER — Inpatient Hospital Stay (HOSPITAL_COMMUNITY): Payer: Medicare Other

## 2020-06-15 ENCOUNTER — Encounter (HOSPITAL_COMMUNITY): Payer: Self-pay | Admitting: Internal Medicine

## 2020-06-15 DIAGNOSIS — K3189 Other diseases of stomach and duodenum: Secondary | ICD-10-CM

## 2020-06-15 HISTORY — PX: BIOPSY: SHX5522

## 2020-06-15 HISTORY — PX: REMOVAL OF STONES: SHX5545

## 2020-06-15 HISTORY — PX: SPHINCTEROTOMY: SHX5279

## 2020-06-15 HISTORY — PX: ERCP: SHX5425

## 2020-06-15 LAB — COMPREHENSIVE METABOLIC PANEL
ALT: 87 U/L — ABNORMAL HIGH (ref 0–44)
AST: 49 U/L — ABNORMAL HIGH (ref 15–41)
Albumin: 2.4 g/dL — ABNORMAL LOW (ref 3.5–5.0)
Alkaline Phosphatase: 76 U/L (ref 38–126)
Anion gap: 10 (ref 5–15)
BUN: 26 mg/dL — ABNORMAL HIGH (ref 8–23)
CO2: 21 mmol/L — ABNORMAL LOW (ref 22–32)
Calcium: 8.3 mg/dL — ABNORMAL LOW (ref 8.9–10.3)
Chloride: 108 mmol/L (ref 98–111)
Creatinine, Ser: 1.56 mg/dL — ABNORMAL HIGH (ref 0.61–1.24)
GFR, Estimated: 47 mL/min — ABNORMAL LOW (ref >60)
Glucose, Bld: 228 mg/dL — ABNORMAL HIGH (ref 70–99)
Potassium: 3.9 mmol/L (ref 3.5–5.1)
Sodium: 139 mmol/L (ref 135–145)
Total Bilirubin: 0.9 mg/dL (ref 0.3–1.2)
Total Protein: 5.8 g/dL — ABNORMAL LOW (ref 6.5–8.1)

## 2020-06-15 LAB — CBC WITH DIFFERENTIAL/PLATELET
Abs Immature Granulocytes: 0.05 10*3/uL (ref 0.00–0.07)
Basophils Absolute: 0 10*3/uL (ref 0.0–0.1)
Basophils Relative: 0 %
Eosinophils Absolute: 0.3 10*3/uL (ref 0.0–0.5)
Eosinophils Relative: 5 %
HCT: 32.3 % — ABNORMAL LOW (ref 39.0–52.0)
Hemoglobin: 10.7 g/dL — ABNORMAL LOW (ref 13.0–17.0)
Immature Granulocytes: 1 %
Lymphocytes Relative: 14 %
Lymphs Abs: 0.8 10*3/uL (ref 0.7–4.0)
MCH: 31.4 pg (ref 26.0–34.0)
MCHC: 33.1 g/dL (ref 30.0–36.0)
MCV: 94.7 fL (ref 80.0–100.0)
Monocytes Absolute: 0.5 10*3/uL (ref 0.1–1.0)
Monocytes Relative: 9 %
Neutro Abs: 4.1 10*3/uL (ref 1.7–7.7)
Neutrophils Relative %: 71 %
Platelets: 202 10*3/uL (ref 150–400)
RBC: 3.41 MIL/uL — ABNORMAL LOW (ref 4.22–5.81)
RDW: 13.7 % (ref 11.5–15.5)
WBC: 5.8 10*3/uL (ref 4.0–10.5)
nRBC: 0 % (ref 0.0–0.2)

## 2020-06-15 LAB — GLUCOSE, CAPILLARY
Glucose-Capillary: 156 mg/dL — ABNORMAL HIGH (ref 70–99)
Glucose-Capillary: 271 mg/dL — ABNORMAL HIGH (ref 70–99)
Glucose-Capillary: 247 mg/dL — ABNORMAL HIGH (ref 70–99)
Glucose-Capillary: 357 mg/dL — ABNORMAL HIGH (ref 70–99)
Glucose-Capillary: 206 mg/dL — ABNORMAL HIGH (ref 70–99)

## 2020-06-15 LAB — MAGNESIUM: Magnesium: 2 mg/dL (ref 1.7–2.4)

## 2020-06-15 SURGERY — ERCP, WITH INTERVENTION IF INDICATED
Anesthesia: General

## 2020-06-15 MED ORDER — GLUCAGON HCL RDNA (DIAGNOSTIC) 1 MG IJ SOLR
INTRAMUSCULAR | Status: DC | PRN
  Administered 2020-06-15: .25 mg via INTRAVENOUS

## 2020-06-15 MED ORDER — HYDRALAZINE HCL 20 MG/ML IJ SOLN
10.0000 mg | Freq: Once | INTRAMUSCULAR | Status: AC
  Administered 2020-06-15: 10 mg via INTRAVENOUS

## 2020-06-15 MED ORDER — GLUCAGON HCL RDNA (DIAGNOSTIC) 1 MG IJ SOLR
INTRAMUSCULAR | Status: AC
  Filled 2020-06-15: qty 1

## 2020-06-15 MED ORDER — DEXAMETHASONE SODIUM PHOSPHATE 10 MG/ML IJ SOLN
INTRAMUSCULAR | Status: DC | PRN
  Administered 2020-06-15: 10 mg via INTRAVENOUS

## 2020-06-15 MED ORDER — LACTATED RINGERS IV SOLN: INTRAVENOUS | Status: DC | PRN

## 2020-06-15 MED ORDER — PHENYLEPHRINE HCL-NACL 10-0.9 MG/250ML-% IV SOLN
INTRAVENOUS | Status: DC | PRN
  Administered 2020-06-15: 20 ug/min via INTRAVENOUS

## 2020-06-15 MED ORDER — CIPROFLOXACIN IN D5W 400 MG/200ML IV SOLN
INTRAVENOUS | Status: AC
  Filled 2020-06-15: qty 200

## 2020-06-15 MED ORDER — LIDOCAINE 2% (20 MG/ML) 5 ML SYRINGE
INTRAMUSCULAR | Status: DC | PRN
  Administered 2020-06-15: 60 mg via INTRAVENOUS

## 2020-06-15 MED ORDER — PROPOFOL 10 MG/ML IV BOLUS
INTRAVENOUS | Status: DC | PRN
  Administered 2020-06-15: 30 mg via INTRAVENOUS
  Administered 2020-06-15: 100 mg via INTRAVENOUS

## 2020-06-15 MED ORDER — SUGAMMADEX SODIUM 200 MG/2ML IV SOLN
INTRAVENOUS | Status: DC | PRN
  Administered 2020-06-15: 200 mg via INTRAVENOUS

## 2020-06-15 MED ORDER — HYDRALAZINE HCL 20 MG/ML IJ SOLN
INTRAMUSCULAR | Status: AC
  Filled 2020-06-15: qty 1

## 2020-06-15 MED ORDER — ROCURONIUM BROMIDE 10 MG/ML (PF) SYRINGE
PREFILLED_SYRINGE | INTRAVENOUS | Status: DC | PRN
  Administered 2020-06-15: 50 mg via INTRAVENOUS

## 2020-06-15 MED ORDER — ONDANSETRON HCL 4 MG/2ML IJ SOLN
INTRAMUSCULAR | Status: DC | PRN
  Administered 2020-06-15: 4 mg via INTRAVENOUS

## 2020-06-15 MED ORDER — INDOMETHACIN 50 MG RE SUPP
RECTAL | Status: DC | PRN
  Administered 2020-06-15: 100 mg via RECTAL

## 2020-06-15 MED ORDER — CIPROFLOXACIN IN D5W 400 MG/200ML IV SOLN
INTRAVENOUS | Status: DC | PRN
  Administered 2020-06-15: 400 mg via INTRAVENOUS

## 2020-06-15 MED ORDER — SODIUM CHLORIDE 0.9 % IV SOLN
INTRAVENOUS | Status: DC | PRN
  Administered 2020-06-15: 15 mL

## 2020-06-15 MED ORDER — FENTANYL CITRATE (PF) 100 MCG/2ML IJ SOLN
INTRAMUSCULAR | Status: DC | PRN
  Administered 2020-06-15 (×2): 50 ug via INTRAVENOUS

## 2020-06-15 MED ORDER — INDOMETHACIN 50 MG RE SUPP
RECTAL | Status: AC
  Filled 2020-06-15: qty 2

## 2020-06-15 NOTE — Interval H&P Note (Signed)
History and Physical Interval Note:  06/15/2020 7:34 AM  [name]  has presented today for surgery, with the diagnosis of Choledocholithiasis.  The various methods of treatment have been discussed with the patient and family. After consideration of risks, benefits and other options for treatment, the patient has consented to  Procedure(s): ENDOSCOPIC RETROGRADE CHOLANGIOPANCREATOGRAPHY (ERCP) (N/A) as a surgical intervention.  The patient's history has been reviewed, patient examined, no change in status, stable for surgery.  I have reviewed the patient's chart and labs.  Questions were answered to the patient's satisfaction.    The risks of an ERCP were discussed at length, including but not limited to the risk of perforation, bleeding, abdominal pain, post-ERCP pancreatitis (while usually mild can be severe and even life threatening).    [employer]

## 2020-06-15 NOTE — H&P (View-Only) (Signed)
Day of Surgery  Subjective: No complaints post ERCP.  ERCP results noted.  ROS: See above, otherwise other systems negative  Objective: Vital signs in last 24 hours: Temp:  [97.6 F (36.4 C)-99.2 F (37.3 C)] 97.9 F (36.6 C) (11/12 0903) Pulse Rate:  [55-80] 55 (11/12 0954) Resp:  [13-19] 13 (11/12 0954) BP: (146-215)/(65-86) 185/65 (11/12 0954) SpO2:  [97 %-100 %] 97 % (11/12 0914) Last BM Date: 06/14/20  Intake/Output from previous day: 11/11 0701 - 11/12 0700 In: -  Out: 550 [Urine:550] Intake/Output this shift: Total I/O In: 500 [I.V.:300; IV Piggyback:200] Out: -   PE: Heart: regular, + murmur Lungs: CTAB Abd: soft, NT, ND, +BS  Lab Results:  Recent Labs    06/14/20 0139 06/15/20 0152  WBC 6.0 5.8  HGB 11.4* 10.7*  HCT 34.2* 32.3*  PLT 221 202   BMET Recent Labs    06/14/20 0139 06/15/20 0152  NA 137 139  K 3.9 3.9  CL 106 108  CO2 21* 21*  GLUCOSE 213* 228*  BUN 24* 26*  CREATININE 1.61* 1.56*  CALCIUM 8.3* 8.3*   PT/INR No results for input(s): LABPROT, INR in the last 72 hours. CMP     Component Value Date/Time   NA 139 06/15/2020 0152   NA 142 09/30/2018 0000   K 3.9 06/15/2020 0152   CL 108 06/15/2020 0152   CO2 21 (L) 06/15/2020 0152   GLUCOSE 228 (H) 06/15/2020 0152   BUN 26 (H) 06/15/2020 0152   BUN 22 (A) 09/30/2018 0000   CREATININE 1.56 (H) 06/15/2020 0152   CREATININE 2.10 (H) 06/05/2020 1101   CALCIUM 8.3 (L) 06/15/2020 0152   PROT 5.8 (L) 06/15/2020 0152   PROT 6.7 01/28/2018 0957   ALBUMIN 2.4 (L) 06/15/2020 0152   ALBUMIN 4.3 01/28/2018 0957   AST 49 (H) 06/15/2020 0152   ALT 87 (H) 06/15/2020 0152   ALKPHOS 76 06/15/2020 0152   BILITOT 0.9 06/15/2020 0152   BILITOT 0.4 01/28/2018 0957   GFRNONAA 47 (L) 06/15/2020 0152   GFRNONAA 31 (L) 06/05/2020 1101   GFRAA 35 (L) 06/05/2020 1101   Lipase     Component Value Date/Time   LIPASE 88 (H) 06/09/2020 0456       Studies/Results: DG ERCP BILIARY &  PANCREATIC DUCTS  Result Date: 06/15/2020 CLINICAL DATA:  ERCP for stones. EXAM: ERCP TECHNIQUE: Multiple spot images obtained with the fluoroscopic device and submitted for interpretation post-procedure. COMPARISON:  MRCP-06/12/2020 FLUOROSCOPY TIME:  3 minutes, 12 seconds (35.7 mGy) FINDINGS: Several spot fluoroscopic images the right upper abdominal quadrant during ERCP are provided for review Initial image demonstrates an ERCP probe overlying the right upper abdominal quadrant. Subsequent images demonstrate selective cannulation and opacification of the CBD. There are several nonocclusive filling defects seen within the mid and distal aspect of the CBD suggestive of choledocholithiasis seen on preceding MRCP. Subsequent images demonstrate insufflation of a balloon within the central aspect of the CBD with subsequent biliary sweeping and presumed sphincterotomy. There is minimal opacification of the pancreatic duct as well as the central aspect of the intrahepatic biliary tree which appears nondilated. There is no definitive opacification of the cystic duct. IMPRESSION: ERCP with biliary sweeping and presumed sphincterotomy as above. These images were submitted for radiologic interpretation only. Please see the procedural report for the amount of contrast and the fluoroscopy time utilized. Electronically Signed   By: Sandi Mariscal M.D.   On: 06/15/2020 09:02    Anti-infectives: Anti-infectives (  From admission, onward)   Start     Dose/Rate Route Frequency Ordered Stop   06/15/20 0800  ceFAZolin (ANCEF) IVPB 2g/100 mL premix        2 g 200 mL/hr over 30 Minutes Intravenous Every 12 hours 06/14/20 1314     06/08/20 0800  cefTRIAXone (ROCEPHIN) 2 g in sodium chloride 0.9 % 100 mL IVPB  Status:  Discontinued        2 g 200 mL/hr over 30 Minutes Intravenous Every 24 hours 06/07/20 1102 06/14/20 1314   06/07/20 1430  doravirine (PIFELTRO) tablet 100 mg       Note to Pharmacy: OP [id number] 1 TABLET(100 MG)  BY MOUTH DAILY with Tivicay Patient taking differently: Take 100 mg by mouth in the morning (with Tivicay)     100 mg Oral Every morning 06/07/20 1102     06/07/20 1345  dolutegravir (TIVICAY) tablet 50 mg        50 mg Oral Every morning 06/07/20 1102     06/07/20 1200  metroNIDAZOLE (FLAGYL) IVPB 500 mg  Status:  Discontinued        500 mg 100 mL/hr over 60 Minutes Intravenous Every 8 hours 06/07/20 1102 06/07/20 2318   06/07/20 1115  valACYclovir (VALTREX) tablet 500 mg        500 mg Oral Every morning 06/07/20 1102     06/07/20 0715  piperacillin-tazobactam (ZOSYN) IVPB 3.375 g        3.375 g 100 mL/hr over 30 Minutes Intravenous  Once 06/07/20 0703 06/07/20 0831   06/07/20 0630  cefTRIAXone (ROCEPHIN) 1 g in sodium chloride 0.9 % 100 mL IVPB        1 g 200 mL/hr over 30 Minutes Intravenous  Once 06/07/20 0618 06/07/20 0705       Assessment/Plan HTN HLD HIV DM2 CHF (EF 40-45% on 05/28/20) CAD (hx of CABG, mult PCI with most recent angioplasty on 05/28/20 for in-stent restenosis) - Brilinta on hold. Cleared by cardiology  E. Coli Bacteremia  Choledocholithiasis  -s/p ERCP -eval in am for post ERCP pancreatitis.  If not symptoms, will proceed with lap chole tomorrow morning. -ok for diet today and NPO p MN -on Ancef for bacteremia, this will suffice for prophylaxis preoperatively. -I have explained the procedure, risks, and aftercare of cholecystectomy.  Risks include but are not limited to bleeding, infection, wound problems, anesthesia, diarrhea, bile leak, injury to common bile duct/liver/intestine.  He seems to understand and agrees to proceed.   FEN - CLD, NPO p MN VTE - SCDs ID - Ancef for E. Coli Bacteremia    LOS: 8 days    [name] , [facility] Surgery 06/15/2020, 11:16 AM Please see Amion for pager number during day hours 7:00am-4:30pm or 7:00am -11:30am on weekends

## 2020-06-15 NOTE — Op Note (Addendum)
[facility] Patient Name: [name] Procedure Date : 06/15/2020 MRN: [id number] Attending MD: [name] , MD Date of Birth: 23-Jul-1946 CSN: [id number] Age: 74 Admit Type: Inpatient Procedure:                ERCP Indications:              Common bile duct stone(s), Abnormal MRCP, Bile duct                            stone on magnetic resonance                            cholangiopancreatography, Bile duct stone(s), For                            therapy of bile duct stone(s), Abnormal liver                            function test, Prior failed Endoscopic Retrograde                            Cholangiopancreatography Providers:                Christian Britain, MD, Christian Cancer, RN, Christian                            [name], Technician, [facility],                            Murriel Hopper CRNA Referring MD:             Christian Chuck. Henrene Pastor MD, MD, Christian Denmark, MD, Christian Park MD, MD, [facility] Medicines:                General Anesthesia, Cipro 088 mg IV Complications:            No immediate complications. Estimated Blood Loss:     Estimated blood loss was minimal. Procedure:                Pre-Anesthesia Assessment:                           - Prior to the procedure, a History and Physical                            was performed, and patient medications and                            allergies were reviewed. The patient's tolerance of                            previous anesthesia was also reviewed. The risks                            and benefits of  the procedure and the sedation                            options and risks were discussed with the patient.                            All questions were answered, and informed consent                            was obtained. Prior Anticoagulants: The patient has                            taken Anti-PLT therapy, last dose was 8 days prior                             to procedure. ASA Grade Assessment: III - A patient                            with severe systemic disease. After reviewing the                            risks and benefits, the patient was deemed in                            satisfactory condition to undergo the procedure.                           After obtaining informed consent, the scope was                            passed under direct vision. Throughout the                            procedure, the patient's blood pressure, pulse, and                            oxygen saturations were monitored continuously. The                            TJF- Q180V ([id number]) Olympus duodenoscope was                            introduced through the mouth, and used to inject                            contrast into and used to inject contrast into the                            bile duct and ventral pancreatic duct. The ERCP was                            accomplished without difficulty. The patient  tolerated the procedure. Scope In: Scope Out: Findings:      The scout film was normal - could not see previously placed pancreatic       stent.      The esophagus was successfully intubated under direct vision without       detailed examination of the pharynx, larynx, and associated structures.       Patchy mildly erythematous mucosa without bleeding was found in the       entire examined stomach. Biopsies were taken on the greater curvature of       the stomach, on the lesser curvature of the stomach, at the incisura and       in the gastric antrum through the ERCP scope with the cold forceps for       histology and H. pylori rule out. The major papilla was on the rim of a       diverticulum. There was no pancreatic stent seen emerging from the major       papilla - thus it had migrated. The major papilla was edematous and       congested.      Initial attempt at wire placement led to slight curling of the wire  in       the presumed ampullary orifice. A small contrast injection was performed       to ensure that we had not led to a submucosal infiltration with the wire       and I could visualize the CBD and PD at that point. Then on 2nd attempt       with the wire, a short 0.035 inch Soft Revolution [name] was passed       into the biliary tree. The Revolution jagtome sphincterotome was passed       over the guidewire and the bile duct was then deeply cannulated.       Contrast was injected. I personally interpreted the bile duct and       pancreatic duct images. Ductal flow of contrast was adequate. Image       quality was adequate. Contrast extended to the hepatic ducts on the       biliary side. The small amount of contrast initially placed extended to       the pancreatic duct - unintentionally. Opacification of the entire       biliary tree except for the cystic duct and gallbladder was successful.       The lower third of the main bile duct and middle third of the main bile       duct contained multiple filling defects thought to be stones and sludge.       The main bile duct was moderately dilated. The largest diameter was 9-10       mm. An 8 mm biliary sphincterotomy was made with a monofilament Jagtome       sphincterotome using ERBE electrocautery to not allow to large of a       defect since we were on the rim of the diverticulum. There was self       limited oozing from the sphincterotomy which did not require treatment.       The biliary tree was swept with a retrieval balloon multiple times       distally and then eventually to the bifurcation. Sludge was swept from       the duct. At least six stones were removed. No stones remained. An  occlusion cholangiogram was performed that showed no further significant       biliary pathology.      The duodenoscope was withdrawn from the patient. Impression:               - Erythematous mucosa in the stomach - biopsied to                             rule out H. pylori.                           - The major papilla was on the rim of a                            diverticulum.                           - The previously placed pancreatic stent had                            migrated.                           - The major papilla appeared edematous and                            congested.                           - Biliary duct filling and unintentional filling of                            the pancreatic duct after injection performed to                            rule out a submucosal wire placement after first                            wire placement.                           - Filling defects consistent with stones and sludge                            were seen on the cholangiogram. The entire main                            bile duct was moderately dilated as a result of                            this.                           - Choledocholithiasis was found. Complete removal                            was accomplished by biliary sphincterotomy  and                            balloon trawl.                           - The cystic duct did not fill at any time point                            during procedure. Recommendation:           - The patient will be observed post-procedure,                            until all discharge criteria are met.                           - Return patient to hospital ward for ongoing care.                           - Clear liquid diet.                           - Observe patient's clinical course.                           - Watch for pancreatitis, bleeding, perforation,                            and cholangitis.                           - Await path results.                           - If patient does well then proceed with                            Cholecystectomy for definitive therapy as per                            Surgical service.                           - Follow up  LFTs eventually as outpatient.                           - Hold restart of anti-PLT therapy (other than                            Aspirin) for at least 72 hours to decrease risk of                            post-interventional bleeding from sphincterotomy.                            If earlier anti-PLT therapy is required more  than                            aspirin then consider IV Cangrelor - but please                            update the GI team.                           - The findings and recommendations were discussed                            with the patient.                           - The findings and recommendations were discussed                            with the patient's family. Procedure Code(s):        --- Professional ---                           [phone], Endoscopic retrograde                            cholangiopancreatography (ERCP); with removal of                            calculi/debris from biliary/pancreatic duct(s)                           43262, Endoscopic retrograde                            cholangiopancreatography (ERCP); with                            sphincterotomy/papillotomy                           43261, Endoscopic retrograde                            cholangiopancreatography (ERCP); with biopsy,                            single or multiple Diagnosis Code(s):        --- Professional ---                           K31.89, Other diseases of stomach and duodenum                           K83.8, Other specified diseases of biliary tract                           R93.2, Abnormal findings on diagnostic imaging of  liver and biliary tract                           K80.50, Calculus of bile duct without cholangitis                            or cholecystitis without obstruction                           R94.5, Abnormal results of liver function studies                           Z98.890, Other specified postprocedural  states CPT copyright 2019 [employer]. All rights reserved. The codes documented in this report are preliminary and upon coder review may  be revised to meet current compliance requirements. Christian Britain, MD 06/15/2020 9:05:31 AM Number of Addenda: 0

## 2020-06-15 NOTE — Transfer of Care (Signed)
Immediate Anesthesia Transfer of Care Note  Patient: [name]  Procedure(s) Performed: ENDOSCOPIC RETROGRADE CHOLANGIOPANCREATOGRAPHY (ERCP) (N/A ) BIOPSY SPHINCTEROTOMY REMOVAL OF STONES  Patient Location: PACU  Anesthesia Type:General  Level of Consciousness: awake, alert  and oriented  Airway & Oxygen Therapy: Patient Spontanous Breathing and Patient connected to face mask oxygen  Post-op Assessment: Report given to RN, Post -op Vital signs reviewed and stable and Patient moving all extremities  Post vital signs: Reviewed and stable  Last Vitals:  Vitals Value Taken Time  BP    Temp    Pulse    Resp    SpO2      Last Pain:  Vitals:   06/15/20 0704  TempSrc: Oral  PainSc: 0-No pain      Patients Stated Pain Goal: 0 (72/82/06 0156)  Complications: No complications documented.

## 2020-06-15 NOTE — Anesthesia Preprocedure Evaluation (Signed)
Anesthesia Evaluation  Patient identified by MRN, date of birth, ID band Patient awake    Reviewed: Allergy & Precautions, H&P , NPO status , Patient's Chart, lab work & pertinent test results  Airway Mallampati: II   Neck ROM: full    Dental   Pulmonary COPD,    breath sounds clear to auscultation       Cardiovascular hypertension, + CAD, + Past MI, + Cardiac Stents, + CABG, + Peripheral Vascular Disease and +CHF  + Valvular Problems/Murmurs AS  Rhythm:regular Rate:Normal  TTE: mild AS, normal LVEF   Neuro/Psych PSYCHIATRIC DISORDERS Depression  Neuromuscular disease    GI/Hepatic GERD  ,choledocholithiasis   Endo/Other  diabetes, Type 2  Renal/GU Renal InsufficiencyRenal disease     Musculoskeletal  (+) Arthritis ,   Abdominal   Peds  Hematology  (+) Blood dyscrasia, anemia ,   Anesthesia Other Findings   Reproductive/Obstetrics                             Anesthesia Physical Anesthesia Plan  ASA: III  Anesthesia Plan: General   Post-op Pain Management:    Induction: Intravenous  PONV Risk Score and Plan: 2 and Ondansetron, Dexamethasone and Treatment may vary due to age or medical condition  Airway Management Planned: Oral ETT  Additional Equipment:   Intra-op Plan:   Post-operative Plan: Extubation in OR  Informed Consent: I have reviewed the patients History and Physical, chart, labs and discussed the procedure including the risks, benefits and alternatives for the proposed anesthesia with the patient or authorized representative who has indicated his/her understanding and acceptance.       Plan Discussed with: CRNA, Anesthesiologist and Surgeon  Anesthesia Plan Comments:         Anesthesia Quick Evaluation

## 2020-06-15 NOTE — Anesthesia Preprocedure Evaluation (Addendum)
Anesthesia Evaluation  Patient identified by MRN, date of birth, ID band Patient awake    Reviewed: Allergy & Precautions, H&P , NPO status , Patient's Chart, lab work & pertinent test results  Airway Mallampati: II  TM Distance: >3 FB Neck ROM: Full    Dental  (+) Missing, Dental Advisory Given   Pulmonary COPD,    Pulmonary exam normal breath sounds clear to auscultation       Cardiovascular hypertension, + angina + CAD, + Past MI, + Cardiac Stents, + CABG, + Peripheral Vascular Disease and +CHF  Normal cardiovascular exam+ Valvular Problems/Murmurs AS  Rhythm:Regular Rate:Normal  Echo 06/08/2020 1. Compared to echo report from 05/28/20, LVEF is improved. Left ventricular ejection fraction, by estimation, is 60 to 65%. The left ventricle has normal function. The left ventricle has no regional wall motion abnormalities.  2. Right ventricular systolic function is normal. The right ventricular size is normal. There is mildly elevated pulmonary artery systolic pressure.  3. Mild mitral valve regurgitation.  4. TR jet is eccentric, directed anterior in RA.. Tricuspid valve regurgitation is mild to moderate.  5. AV is thickened, calcified with restricted motion . Peak and mean gradients through the valve are 25 and 15 mm Hg respectively This is increasred from echo report of 10/25. The aortic valve is abnormal. Aortic valve regurgitation is mild.  6. The inferior vena cava is dilated in size with <50% respiratory variability, suggesting right atrial pressure of 15 mmHg.    LHC 05/28/2020 1. Severe native coronary artery disease, as detailed below. 2. Widely patent LIMA to diagonal/LAD. 3. Patent SVG to diagonal with 90% in-stent restenosis involving the distal anastomosis. 4. Patent SVG to ramus intermedius with chronic total occlusion of jump portion to OM1.  There is 30 to 40% in-stent restenosis at the ostium of the SVG to ramus  intermedius. 5. Chronic total occlusion of sequential SVG to RPDA and RPL. 6. Normal left ventricular filling pressure. 7. Successful PTCA to distal SVG to diagonal in-stent restenosis with 0% residual stenosis and TIMI-3 flow.    Neuro/Psych PSYCHIATRIC DISORDERS Depression  Neuromuscular disease    GI/Hepatic GERD  ,choledocholithiasis   Endo/Other  diabetes, Type 2  Renal/GU Renal InsufficiencyRenal disease     Musculoskeletal  (+) Arthritis ,   Abdominal   Peds  Hematology  (+) Blood dyscrasia, anemia ,   Anesthesia Other Findings   Reproductive/Obstetrics                            Anesthesia Physical  Anesthesia Plan  ASA: III  Anesthesia Plan: General   Post-op Pain Management:    Induction: Intravenous  PONV Risk Score and Plan: 4 or greater and Ondansetron, Dexamethasone and Treatment may vary due to age or medical condition  Airway Management Planned: Oral ETT  Additional Equipment:   Intra-op Plan:   Post-operative Plan: Extubation in OR  Informed Consent: I have reviewed the patients History and Physical, chart, labs and discussed the procedure including the risks, benefits and alternatives for the proposed anesthesia with the patient or authorized representative who has indicated his/her understanding and acceptance.     Dental advisory given  Plan Discussed with: CRNA  Anesthesia Plan Comments: (+/- aline)       Anesthesia Quick Evaluation

## 2020-06-15 NOTE — Progress Notes (Signed)
Day of Surgery  Subjective: No complaints post ERCP.  ERCP results noted.  ROS: See above, otherwise other systems negative  Objective: Vital signs in last 24 hours: Temp:  [97.6 F (36.4 C)-99.2 F (37.3 C)] 97.9 F (36.6 C) (11/12 0903) Pulse Rate:  [55-80] 55 (11/12 0954) Resp:  [13-19] 13 (11/12 0954) BP: (146-215)/(65-86) 185/65 (11/12 0954) SpO2:  [97 %-100 %] 97 % (11/12 0914) Last BM Date: 06/14/20  Intake/Output from previous day: 11/11 0701 - 11/12 0700 In: -  Out: 550 [Urine:550] Intake/Output this shift: Total I/O In: 500 [I.V.:300; IV Piggyback:200] Out: -   PE: Heart: regular, + murmur Lungs: CTAB Abd: soft, NT, ND, +BS  Lab Results:  Recent Labs    06/14/20 0139 06/15/20 0152  WBC 6.0 5.8  HGB 11.4* 10.7*  HCT 34.2* 32.3*  PLT 221 202   BMET Recent Labs    06/14/20 0139 06/15/20 0152  NA 137 139  K 3.9 3.9  CL 106 108  CO2 21* 21*  GLUCOSE 213* 228*  BUN 24* 26*  CREATININE 1.61* 1.56*  CALCIUM 8.3* 8.3*   PT/INR No results for input(s): LABPROT, INR in the last 72 hours. CMP     Component Value Date/Time   NA 139 06/15/2020 0152   NA 142 09/30/2018 0000   K 3.9 06/15/2020 0152   CL 108 06/15/2020 0152   CO2 21 (L) 06/15/2020 0152   GLUCOSE 228 (H) 06/15/2020 0152   BUN 26 (H) 06/15/2020 0152   BUN 22 (A) 09/30/2018 0000   CREATININE 1.56 (H) 06/15/2020 0152   CREATININE 2.10 (H) 06/05/2020 1101   CALCIUM 8.3 (L) 06/15/2020 0152   PROT 5.8 (L) 06/15/2020 0152   PROT 6.7 01/28/2018 0957   ALBUMIN 2.4 (L) 06/15/2020 0152   ALBUMIN 4.3 01/28/2018 0957   AST 49 (H) 06/15/2020 0152   ALT 87 (H) 06/15/2020 0152   ALKPHOS 76 06/15/2020 0152   BILITOT 0.9 06/15/2020 0152   BILITOT 0.4 01/28/2018 0957   GFRNONAA 47 (L) 06/15/2020 0152   GFRNONAA 31 (L) 06/05/2020 1101   GFRAA 35 (L) 06/05/2020 1101   Lipase     Component Value Date/Time   LIPASE 88 (H) 06/09/2020 0456       Studies/Results: DG ERCP BILIARY &  PANCREATIC DUCTS  Result Date: 06/15/2020 CLINICAL DATA:  ERCP for stones. EXAM: ERCP TECHNIQUE: Multiple spot images obtained with the fluoroscopic device and submitted for interpretation post-procedure. COMPARISON:  MRCP-06/12/2020 FLUOROSCOPY TIME:  3 minutes, 12 seconds (35.7 mGy) FINDINGS: Several spot fluoroscopic images the right upper abdominal quadrant during ERCP are provided for review Initial image demonstrates an ERCP probe overlying the right upper abdominal quadrant. Subsequent images demonstrate selective cannulation and opacification of the CBD. There are several nonocclusive filling defects seen within the mid and distal aspect of the CBD suggestive of choledocholithiasis seen on preceding MRCP. Subsequent images demonstrate insufflation of a balloon within the central aspect of the CBD with subsequent biliary sweeping and presumed sphincterotomy. There is minimal opacification of the pancreatic duct as well as the central aspect of the intrahepatic biliary tree which appears nondilated. There is no definitive opacification of the cystic duct. IMPRESSION: ERCP with biliary sweeping and presumed sphincterotomy as above. These images were submitted for radiologic interpretation only. Please see the procedural report for the amount of contrast and the fluoroscopy time utilized. Electronically Signed   By: Sandi Mariscal M.D.   On: 06/15/2020 09:02    Anti-infectives: Anti-infectives (  From admission, onward)   Start     Dose/Rate Route Frequency Ordered Stop   06/15/20 0800  ceFAZolin (ANCEF) IVPB 2g/100 mL premix        2 g 200 mL/hr over 30 Minutes Intravenous Every 12 hours 06/14/20 1314     06/08/20 0800  cefTRIAXone (ROCEPHIN) 2 g in sodium chloride 0.9 % 100 mL IVPB  Status:  Discontinued        2 g 200 mL/hr over 30 Minutes Intravenous Every 24 hours 06/07/20 1102 06/14/20 1314   06/07/20 1430  doravirine (PIFELTRO) tablet 100 mg       Note to Pharmacy: OP MVH:QION 1 TABLET(100 MG)  BY MOUTH DAILY with Tivicay Patient taking differently: Take 100 mg by mouth in the morning (with Tivicay)     100 mg Oral Every morning 06/07/20 1102     06/07/20 1345  dolutegravir (TIVICAY) tablet 50 mg        50 mg Oral Every morning 06/07/20 1102     06/07/20 1200  metroNIDAZOLE (FLAGYL) IVPB 500 mg  Status:  Discontinued        500 mg 100 mL/hr over 60 Minutes Intravenous Every 8 hours 06/07/20 1102 06/07/20 2318   06/07/20 1115  valACYclovir (VALTREX) tablet 500 mg        500 mg Oral Every morning 06/07/20 1102     06/07/20 0715  piperacillin-tazobactam (ZOSYN) IVPB 3.375 g        3.375 g 100 mL/hr over 30 Minutes Intravenous  Once 06/07/20 0703 06/07/20 0831   06/07/20 0630  cefTRIAXone (ROCEPHIN) 1 g in sodium chloride 0.9 % 100 mL IVPB        1 g 200 mL/hr over 30 Minutes Intravenous  Once 06/07/20 0618 06/07/20 0705       Assessment/Plan HTN HLD HIV DM2 CHF (EF 40-45% on 05/28/20) CAD (hx of CABG, mult PCI with most recent angioplasty on 05/28/20 for in-stent restenosis) - Brilinta on hold. Cleared by cardiology  E. Coli Bacteremia  Choledocholithiasis  -s/p ERCP -eval in am for post ERCP pancreatitis.  If not symptoms, will proceed with lap chole tomorrow morning. -ok for diet today and NPO p MN -on Ancef for bacteremia, this will suffice for prophylaxis preoperatively. -I have explained the procedure, risks, and aftercare of cholecystectomy.  Risks include but are not limited to bleeding, infection, wound problems, anesthesia, diarrhea, bile leak, injury to common bile duct/liver/intestine.  He seems to understand and agrees to proceed.   FEN - CLD, NPO p MN VTE - SCDs ID - Ancef for E. Coli Bacteremia    LOS: 8 days    Henreitta Cea , [facility] 06/15/2020, 11:16 AM Please see Amion for pager number during day hours 7:00am-4:30pm or 7:00am -11:30am on weekends

## 2020-06-15 NOTE — Anesthesia Procedure Notes (Signed)
Procedure Name: Intubation Date/Time: 06/15/2020 7:47 AM Performed by: Genelle Bal, CRNA Pre-anesthesia Checklist: Patient identified, Emergency Drugs available, Suction available and Patient being monitored Patient Re-evaluated:Patient Re-evaluated prior to induction Oxygen Delivery Method: Circle system utilized Preoxygenation: Pre-oxygenation with 100% oxygen Induction Type: IV induction Ventilation: Mask ventilation without difficulty Laryngoscope Size: Miller and 2 Grade View: Grade I Tube type: Oral Tube size: 7.5 mm Number of attempts: 1 Airway Equipment and Method: Stylet and Oral airway Placement Confirmation: ETT inserted through vocal cords under direct vision,  positive ETCO2 and breath sounds checked- equal and bilateral Secured at: 21 cm Tube secured with: Tape Dental Injury: Teeth and Oropharynx as per pre-operative assessment

## 2020-06-15 NOTE — Plan of Care (Signed)
Received patient and report from Endoscopy. Patient Alert and oriented. Denies pain or discomfort at this time.Safety precautions maintained.

## 2020-06-15 NOTE — Progress Notes (Signed)
Pt NPO after midnight, signed a consent for the scheduled procedure ERCP.

## 2020-06-15 NOTE — Progress Notes (Signed)
PROGRESS NOTE    PAULA ZIETZ   TFT:[id number]  DOB: [birth year]  DOA: 06/07/2020     8  PCP: Dorothyann Peng, NP  CC: abdominal pain  Hospital Course: Mr. Vokes is a 74 year old male with history of coronary disease status post recent left heart cath (05/28/20) with successful PTCA to distal SVG to diagonal in-stent restenosis (on asa/brilinta PTA), diabetes type 2, CKD3b, hypertension, HIV, chronic diastolic congestive heart failure, BPH who presented to the emergency department with complaints of abdominal pain mainly in the epigastric region and right upper quadrant.   In the emergency department, he also had fever and was tachycardic.  Patient was found to have elevated liver enzymes on presentation, elevated bilirubin, elevated lactate, elevated troponin.   CT abdomen/pelvis showed cholelithiasis, multiple stones in the distal CBD resulting in mild CBD dilation. Patient was admitted for the management of sepsis secondary to cholangitis.  GI, general surgery, cardiology following.    He was also positive for E. coli bacteremia which was presumed translocation from GI source.  He was treated with Rocephin during hospitalization.  Unsuccessful ERCP on 06/11/20 but stent placed in pancreatic duct.   Repeat ERCP on 11/12 was successful and stones were removed. Tentative plan is now lap CCY on 11/13.    Interval History:  Underwent ERCP this am. Seen in room after procedure, resting comfortably. No pain or distress.   Old records reviewed in assessment of this patient  ROS: Constitutional: negative for chills and fevers, Respiratory: negative for cough, Cardiovascular: negative for chest pain and Gastrointestinal: negative for abdominal pain, constipation and diarrhea  Assessment & Plan: * Severe sepsis (HCC)-resolved as of 06/14/2020 Fever, tachycardia, lactic acidosis, AKI, leukocytosis.  Work-up consistent with choledocholithiasis and cholangitis.  Also developed E. coli bacteremia  from presumed translocation of GI source - Rocephin further narrowed to Ancef; continue to complete 2 week course total (can transition to PO after surgery and he remains stable) -If fever, will need repeat blood cultures  Choledocholithiasis -Patient presented with epigastric pain.  Technical difficulty with ERCP.  MRCP was repeated on 06/12/2020 which reveals cholelithiasis, choledocholithiasis with multiple calculi seen within dilated CBD - repeat ERCP done on 11/12; stones successfully removed - follow up lipase in am, s/p ERCP  Chronic diastolic CHF (congestive heart failure) (HCC) - lasix on hold.   - initially on gentle IV fluids due to concern for sepsis, dehydration and AKI.  Continue current medications.Echo done on this admission showed ejection fraction of 60 to 65%, no wall motion abnormality - IVF now off  Coronary artery disease - s/p PTCA on 10/25 for in-stent restenosis. Was discharged on asa/brilinta - brilinta on hold for procedures/surgery - continue asa, lopressor - statin on hold for now   Type 2 diabetes mellitus with stage 3b chronic kidney disease, without long-term current use of insulin (HCC) - continue SSI and CBG monitoring   Acute renal failure superimposed on stage 3b chronic kidney disease ([place]) Presented with creatinine in the range of 2.  Baseline creatinine ranges from 1.5-1.8.  Kidney function improved with IV fluids.  HIV disease ([place]) - continue home meds   Antimicrobials: Rocephin 06/07/2020>> present  DVT prophylaxis: SCD Code Status: Full Family Communication: None present Disposition Plan: Status is: Inpatient  Remains inpatient appropriate because:Ongoing diagnostic testing needed not appropriate for outpatient work up, Unsafe d/c plan, IV treatments appropriate due to intensity of illness or inability to take PO and Inpatient level of care appropriate due to severity of  illness   Dispo: The patient is from: Home               Anticipated d/c is to: Home              Anticipated d/c date is: > 3 days              Patient currently is not medically stable to d/c.  Objective: Blood pressure (!) 147/62, pulse (!) 57, temperature (!) 97.5 F (36.4 C), temperature source Oral, resp. rate 17, height 5\' 3"  (1.6 m), weight 61.2 kg, SpO2 97 %.  Examination: General appearance: alert, cooperative and no distress Head: Normocephalic, without obvious abnormality, atraumatic Eyes: EOMI Lungs: clear to auscultation bilaterally Heart: regular rate and rhythm and S1, S2 normal Abdomen: normal findings: bowel sounds normal and soft, non-tender Extremities: No edema Skin: mobility and turgor normal Neurologic: Grossly normal  Consultants:   GI  Surgery  Cardiology  Procedures:   06/11/2020: ERCP  06/15/2020: ERCP, 2nd  Data Reviewed: I have personally reviewed following labs and imaging studies Results for orders placed or performed during the hospital encounter of 06/07/20 (from the past 24 hour(s))  Glucose, capillary     Status: None   Collection Time: 06/14/20  4:22 PM  Result Value Ref Range   Glucose-Capillary 89 70 - 99 mg/dL  Glucose, capillary     Status: Abnormal   Collection Time: 06/14/20  9:00 PM  Result Value Ref Range   Glucose-Capillary 193 (H) 70 - 99 mg/dL  CBC with Differential/Platelet     Status: Abnormal   Collection Time: 06/15/20  1:52 AM  Result Value Ref Range   WBC 5.8 4.0 - 10.5 K/uL   RBC 3.41 (L) 4.22 - 5.81 MIL/uL   Hemoglobin 10.7 (L) 13.0 - 17.0 g/dL   HCT 32.3 (L) 39 - 52 %   MCV 94.7 80.0 - 100.0 fL   MCH 31.4 26.0 - 34.0 pg   MCHC 33.1 30.0 - 36.0 g/dL   RDW 13.7 11.5 - 15.5 %   Platelets 202 150 - 400 K/uL   nRBC 0.0 0.0 - 0.2 %   Neutrophils Relative % 71 %   Neutro Abs 4.1 1.7 - 7.7 K/uL   Lymphocytes Relative 14 %   Lymphs Abs 0.8 0.7 - 4.0 K/uL   Monocytes Relative 9 %   Monocytes Absolute 0.5 0.1 - 1.0 K/uL   Eosinophils Relative 5 %   Eosinophils  Absolute 0.3 0.0 - 0.5 K/uL   Basophils Relative 0 %   Basophils Absolute 0.0 0.0 - 0.1 K/uL   Immature Granulocytes 1 %   Abs Immature Granulocytes 0.05 0.00 - 0.07 K/uL  Comprehensive metabolic panel     Status: Abnormal   Collection Time: 06/15/20  1:52 AM  Result Value Ref Range   Sodium 139 135 - 145 mmol/L   Potassium 3.9 3.5 - 5.1 mmol/L   Chloride 108 98 - 111 mmol/L   CO2 21 (L) 22 - 32 mmol/L   Glucose, Bld 228 (H) 70 - 99 mg/dL   BUN 26 (H) 8 - 23 mg/dL   Creatinine, Ser 1.56 (H) 0.61 - 1.24 mg/dL   Calcium 8.3 (L) 8.9 - 10.3 mg/dL   Total Protein 5.8 (L) 6.5 - 8.1 g/dL   Albumin 2.4 (L) 3.5 - 5.0 g/dL   AST 49 (H) 15 - 41 U/L   ALT 87 (H) 0 - 44 U/L   Alkaline Phosphatase 76 38 - 126 U/L  Total Bilirubin 0.9 0.3 - 1.2 mg/dL   GFR, Estimated 47 (L) >60 mL/min   Anion gap 10 5 - 15  Magnesium     Status: None   Collection Time: 06/15/20  1:52 AM  Result Value Ref Range   Magnesium 2.0 1.7 - 2.4 mg/dL  Glucose, capillary     Status: Abnormal   Collection Time: 06/15/20  7:16 AM  Result Value Ref Range   Glucose-Capillary 156 (H) 70 - 99 mg/dL  Glucose, capillary     Status: Abnormal   Collection Time: 06/15/20  9:54 AM  Result Value Ref Range   Glucose-Capillary 271 (H) 70 - 99 mg/dL  Glucose, capillary     Status: Abnormal   Collection Time: 06/15/20  1:11 PM  Result Value Ref Range   Glucose-Capillary 247 (H) 70 - 99 mg/dL   *Note: Due to a large number of results and/or encounters for the requested time period, some results have not been displayed. A complete set of results can be found in Results Review.    Recent Results (from the past 240 hour(s))  Respiratory Panel by RT PCR (Flu A&B, Covid) - Nasopharyngeal Swab     Status: None   Collection Time: 06/07/20  5:51 AM   Specimen: Nasopharyngeal Swab  Result Value Ref Range Status   SARS Coronavirus 2 by RT PCR NEGATIVE NEGATIVE Final    Comment: (NOTE) SARS-CoV-2 target nucleic acids are NOT  DETECTED.  The SARS-CoV-2 RNA is generally detectable in upper respiratoy specimens during the acute phase of infection. The lowest concentration of SARS-CoV-2 viral copies this assay can detect is 131 copies/mL. A negative result does not preclude SARS-Cov-2 infection and should not be used as the sole basis for treatment or other patient management decisions. A negative result may occur with  improper specimen collection/handling, submission of specimen other than nasopharyngeal swab, presence of viral mutation(s) within the areas targeted by this assay, and inadequate number of viral copies (<131 copies/mL). A negative result must be combined with clinical observations, patient history, and epidemiological information. The expected result is Negative.  Fact Sheet for Patients:  PinkCheek.be  Fact Sheet for Healthcare Providers:  GravelBags.it  This test is no t yet approved or cleared by the [place] FDA and  has been authorized for detection and/or diagnosis of SARS-CoV-2 by FDA under an Emergency Use Authorization (EUA). This EUA will remain  in effect (meaning this test can be used) for the duration of the COVID-19 declaration under Section 564(b)(1) of the Act, 21 U.S.C. section 360bbb-3(b)(1), unless the authorization is terminated or revoked sooner.     Influenza A by PCR NEGATIVE NEGATIVE Final   Influenza B by PCR NEGATIVE NEGATIVE Final    Comment: (NOTE) The Xpert Xpress SARS-CoV-2/FLU/RSV assay is intended as an aid in  the diagnosis of influenza from Nasopharyngeal swab specimens and  should not be used as a sole basis for treatment. Nasal washings and  aspirates are unacceptable for Xpert Xpress SARS-CoV-2/FLU/RSV  testing.  Fact Sheet for Patients: PinkCheek.be  Fact Sheet for Healthcare Providers: GravelBags.it  This test is not yet  approved or cleared by the [place] FDA and  has been authorized for detection and/or diagnosis of SARS-CoV-2 by  FDA under an Emergency Use Authorization (EUA). This EUA will remain  in effect (meaning this test can be used) for the duration of the  Covid-19 declaration under Section 564(b)(1) of the Act, 21  U.S.C. section 360bbb-3(b)(1), unless the authorization  is  terminated or revoked. Performed at [facility], [place] [address], [place], [place] [address]   Blood culture (routine single)     Status: Abnormal   Collection Time: 06/07/20  5:52 AM   Specimen: BLOOD  Result Value Ref Range Status   Specimen Description BLOOD SITE NOT SPECIFIED  Final   Special Requests   Final    BOTTLES DRAWN AEROBIC AND ANAEROBIC Blood Culture adequate volume   Culture  Setup Time   Final    GRAM NEGATIVE RODS IN BOTH AEROBIC AND ANAEROBIC BOTTLES CRITICAL RESULT CALLED TO, READ BACK BY AND VERIFIED WITH: PHARMD G ABBOTT 06/07/20 AT 2313 SK Performed at [facility], [place] [address], [place], [place] [address]    Culture ESCHERICHIA COLI (A)  Final   Report Status 06/09/2020 FINAL  Final   Organism ID, Bacteria ESCHERICHIA COLI  Final      Susceptibility   Escherichia coli - MIC*    AMPICILLIN <=2 SENSITIVE Sensitive     CEFAZOLIN <=4 SENSITIVE Sensitive     CEFEPIME <=0.12 SENSITIVE Sensitive     CEFTAZIDIME <=1 SENSITIVE Sensitive     CEFTRIAXONE <=0.25 SENSITIVE Sensitive     CIPROFLOXACIN <=0.25 SENSITIVE Sensitive     GENTAMICIN <=1 SENSITIVE Sensitive     IMIPENEM <=0.25 SENSITIVE Sensitive     TRIMETH/SULFA <=20 SENSITIVE Sensitive     AMPICILLIN/SULBACTAM <=2 SENSITIVE Sensitive     PIP/TAZO <=4 SENSITIVE Sensitive     * ESCHERICHIA COLI  Blood Culture ID Panel (Reflexed)     Status: Abnormal   Collection Time: 06/07/20  5:52 AM  Result Value Ref Range Status   Enterococcus faecalis NOT DETECTED NOT DETECTED Final   Enterococcus Faecium NOT DETECTED NOT DETECTED  Final   Listeria monocytogenes NOT DETECTED NOT DETECTED Final   Staphylococcus species NOT DETECTED NOT DETECTED Final   Staphylococcus aureus (BCID) NOT DETECTED NOT DETECTED Final   Staphylococcus epidermidis NOT DETECTED NOT DETECTED Final   Staphylococcus lugdunensis NOT DETECTED NOT DETECTED Final   Streptococcus species NOT DETECTED NOT DETECTED Final   Streptococcus agalactiae NOT DETECTED NOT DETECTED Final   Streptococcus pneumoniae NOT DETECTED NOT DETECTED Final   Streptococcus pyogenes NOT DETECTED NOT DETECTED Final   A.calcoaceticus-baumannii NOT DETECTED NOT DETECTED Final   Bacteroides fragilis NOT DETECTED NOT DETECTED Final   Enterobacterales DETECTED (A) NOT DETECTED Final    Comment: Enterobacterales represent a large order of gram negative bacteria, not a single organism. CRITICAL RESULT CALLED TO, READ BACK BY AND VERIFIED WITH: PHARMD G ABBOTT 06/07/20 AT 2313 SK    Enterobacter cloacae complex NOT DETECTED NOT DETECTED Final   Escherichia coli DETECTED (A) NOT DETECTED Final    Comment: CRITICAL RESULT CALLED TO, READ BACK BY AND VERIFIED WITH: PHARMD G ABBOTT 06/07/20 AT 2313 SK    Klebsiella aerogenes NOT DETECTED NOT DETECTED Final   Klebsiella oxytoca NOT DETECTED NOT DETECTED Final   Klebsiella pneumoniae NOT DETECTED NOT DETECTED Final   Proteus species NOT DETECTED NOT DETECTED Final   Salmonella species NOT DETECTED NOT DETECTED Final   Serratia marcescens NOT DETECTED NOT DETECTED Final   Haemophilus influenzae NOT DETECTED NOT DETECTED Final   Neisseria meningitidis NOT DETECTED NOT DETECTED Final   Pseudomonas aeruginosa NOT DETECTED NOT DETECTED Final   Stenotrophomonas maltophilia NOT DETECTED NOT DETECTED Final   Candida albicans NOT DETECTED NOT DETECTED Final   Candida auris NOT DETECTED NOT DETECTED Final   Candida glabrata  NOT DETECTED NOT DETECTED Final   Candida krusei NOT DETECTED NOT DETECTED Final   Candida parapsilosis NOT DETECTED  NOT DETECTED Final   Candida tropicalis NOT DETECTED NOT DETECTED Final   Cryptococcus neoformans/gattii NOT DETECTED NOT DETECTED Final   CTX-M ESBL NOT DETECTED NOT DETECTED Final   Carbapenem resistance IMP NOT DETECTED NOT DETECTED Final   Carbapenem resistance KPC NOT DETECTED NOT DETECTED Final   Carbapenem resistance NDM NOT DETECTED NOT DETECTED Final   Carbapenem resist OXA 48 LIKE NOT DETECTED NOT DETECTED Final   Carbapenem resistance VIM NOT DETECTED NOT DETECTED Final    Comment: Performed at [facility], [place] [address], [place], [place] [address]  Gastrointestinal Panel by PCR , Stool     Status: None   Collection Time: 06/08/20 10:03 AM   Specimen: Stool  Result Value Ref Range Status   Campylobacter species NOT DETECTED NOT DETECTED Final   Plesimonas shigelloides NOT DETECTED NOT DETECTED Final   Salmonella species NOT DETECTED NOT DETECTED Final   Yersinia enterocolitica NOT DETECTED NOT DETECTED Final   Vibrio species NOT DETECTED NOT DETECTED Final   Vibrio cholerae NOT DETECTED NOT DETECTED Final   Enteroaggregative E coli (EAEC) NOT DETECTED NOT DETECTED Final   Enteropathogenic E coli (EPEC) NOT DETECTED NOT DETECTED Final   Enterotoxigenic E coli (ETEC) NOT DETECTED NOT DETECTED Final   Shiga like toxin producing E coli (STEC) NOT DETECTED NOT DETECTED Final   Shigella/Enteroinvasive E coli (EIEC) NOT DETECTED NOT DETECTED Final   Cryptosporidium NOT DETECTED NOT DETECTED Final   Cyclospora cayetanensis NOT DETECTED NOT DETECTED Final   Entamoeba histolytica NOT DETECTED NOT DETECTED Final   Giardia lamblia NOT DETECTED NOT DETECTED Final   Adenovirus F40/41 NOT DETECTED NOT DETECTED Final   Astrovirus NOT DETECTED NOT DETECTED Final   Norovirus GI/GII NOT DETECTED NOT DETECTED Final   Rotavirus A NOT DETECTED NOT DETECTED Final   Sapovirus (I, II, IV, and V) NOT DETECTED NOT DETECTED Final    Comment: Performed at [facility], [address] [place], [place] [address]     Radiology Studies: DG ERCP BILIARY & PANCREATIC DUCTS  Result Date: 06/15/2020 CLINICAL DATA:  ERCP for stones. EXAM: ERCP TECHNIQUE: Multiple spot images obtained with the fluoroscopic device and submitted for interpretation post-procedure. COMPARISON:  MRCP-06/12/2020 FLUOROSCOPY TIME:  3 minutes, 12 seconds (35.7 mGy) FINDINGS: Several spot fluoroscopic images the right upper abdominal quadrant during ERCP are provided for review Initial image demonstrates an ERCP probe overlying the right upper abdominal quadrant. Subsequent images demonstrate selective cannulation and opacification of the CBD. There are several nonocclusive filling defects seen within the mid and distal aspect of the CBD suggestive of choledocholithiasis seen on preceding MRCP. Subsequent images demonstrate insufflation of a balloon within the central aspect of the CBD with subsequent biliary sweeping and presumed sphincterotomy. There is minimal opacification of the pancreatic duct as well as the central aspect of the intrahepatic biliary tree which appears nondilated. There is no definitive opacification of the cystic duct. IMPRESSION: ERCP with biliary sweeping and presumed sphincterotomy as above. These images were submitted for radiologic interpretation only. Please see the procedural report for the amount of contrast and the fluoroscopy time utilized. Electronically Signed   By: Sandi Mariscal M.D.   On: 06/15/2020 09:02   DG ERCP BILIARY & PANCREATIC DUCTS  Final Result    MR ABDOMEN MRCP W WO CONTAST  Final Result    DG Abd  1 View - KUB  Final Result    DG C-Arm 1-60 Min  Final Result    CT ABDOMEN PELVIS WO CONTRAST  Final Result    US Abdomen Limited RUQ (LIVER/GB)  Final Result    DG Chest 2 View  Final Result      Scheduled Meds: . acidophilus  1 capsule Oral Q breakfast  . aspirin EC  81 mg Oral Daily  . B-complex with vitamin C  1 tablet Oral Q breakfast  .  cholecalciferol  2,000 Units Oral q AM  . dolutegravir  50 mg Oral q AM  . doravirine  100 mg Oral q AM  . insulin aspart  0-15 Units Subcutaneous TID WC  . isosorbide mononitrate  30 mg Oral Daily  . metoprolol tartrate  12.5 mg Oral BID  . pantoprazole  40 mg Oral QAC breakfast  . potassium chloride SA  20 mEq Oral Daily  . sodium bicarbonate  650 mg Oral TID  . tamsulosin  0.4 mg Oral Daily  . valACYclovir  500 mg Oral q AM   PRN Meds: labetalol, loperamide, nitroGLYCERIN, ondansetron **OR** ondansetron (ZOFRAN) IV Continuous Infusions: .  ceFAZolin (ANCEF) IV       LOS: 8 days  Time spent: Greater than 50% of the 35 minute visit was spent in counseling/coordination of care for the patient as laid out in the A&P.   Dwyane Dee, MD [facility] 06/15/2020, 3:24 PM

## 2020-06-16 ENCOUNTER — Encounter (HOSPITAL_COMMUNITY): Payer: Self-pay | Admitting: Internal Medicine

## 2020-06-16 ENCOUNTER — Encounter (HOSPITAL_COMMUNITY)
Admission: EM | Disposition: A | Discharge status: Home / Self Care | Payer: Self-pay | Source: Home / Self Care | Attending: Internal Medicine

## 2020-06-16 ENCOUNTER — Inpatient Hospital Stay (HOSPITAL_COMMUNITY): Payer: Medicare Other | Admitting: Anesthesiology

## 2020-06-16 DIAGNOSIS — K8050 Calculus of bile duct without cholangitis or cholecystitis without obstruction: Secondary | ICD-10-CM | POA: Diagnosis not present

## 2020-06-16 DIAGNOSIS — R748 Abnormal levels of other serum enzymes: Secondary | ICD-10-CM | POA: Diagnosis not present

## 2020-06-16 HISTORY — PX: CHOLECYSTECTOMY: SHX55

## 2020-06-16 LAB — COMPREHENSIVE METABOLIC PANEL
ALT: 104 U/L — ABNORMAL HIGH (ref 0–44)
AST: 69 U/L — ABNORMAL HIGH (ref 15–41)
Albumin: 2.6 g/dL — ABNORMAL LOW (ref 3.5–5.0)
Alkaline Phosphatase: 68 U/L (ref 38–126)
Anion gap: 9 (ref 5–15)
BUN: 26 mg/dL — ABNORMAL HIGH (ref 8–23)
CO2: 20 mmol/L — ABNORMAL LOW (ref 22–32)
Calcium: 8.5 mg/dL — ABNORMAL LOW (ref 8.9–10.3)
Chloride: 108 mmol/L (ref 98–111)
Creatinine, Ser: 1.56 mg/dL — ABNORMAL HIGH (ref 0.61–1.24)
GFR, Estimated: 47 mL/min — ABNORMAL LOW (ref >60)
Glucose, Bld: 126 mg/dL — ABNORMAL HIGH (ref 70–99)
Potassium: 4 mmol/L (ref 3.5–5.1)
Sodium: 137 mmol/L (ref 135–145)
Total Bilirubin: 1.2 mg/dL (ref 0.3–1.2)
Total Protein: 5.9 g/dL — ABNORMAL LOW (ref 6.5–8.1)

## 2020-06-16 LAB — CBC WITH DIFFERENTIAL/PLATELET
Abs Immature Granulocytes: 0.07 10*3/uL (ref 0.00–0.07)
Basophils Absolute: 0 10*3/uL (ref 0.0–0.1)
Basophils Relative: 0 %
Eosinophils Absolute: 0.1 10*3/uL (ref 0.0–0.5)
Eosinophils Relative: 1 %
HCT: 32.8 % — ABNORMAL LOW (ref 39.0–52.0)
Hemoglobin: 11.2 g/dL — ABNORMAL LOW (ref 13.0–17.0)
Immature Granulocytes: 1 %
Lymphocytes Relative: 7 %
Lymphs Abs: 0.7 10*3/uL (ref 0.7–4.0)
MCH: 31.9 pg (ref 26.0–34.0)
MCHC: 34.1 g/dL (ref 30.0–36.0)
MCV: 93.4 fL (ref 80.0–100.0)
Monocytes Absolute: 0.6 10*3/uL (ref 0.1–1.0)
Monocytes Relative: 6 %
Neutro Abs: 9.2 10*3/uL — ABNORMAL HIGH (ref 1.7–7.7)
Neutrophils Relative %: 85 %
Platelets: 247 10*3/uL (ref 150–400)
RBC: 3.51 MIL/uL — ABNORMAL LOW (ref 4.22–5.81)
RDW: 13.2 % (ref 11.5–15.5)
WBC: 10.7 10*3/uL — ABNORMAL HIGH (ref 4.0–10.5)
nRBC: 0 % (ref 0.0–0.2)

## 2020-06-16 LAB — GLUCOSE, CAPILLARY
Glucose-Capillary: 103 mg/dL — ABNORMAL HIGH (ref 70–99)
Glucose-Capillary: 96 mg/dL (ref 70–99)
Glucose-Capillary: 213 mg/dL — ABNORMAL HIGH (ref 70–99)
Glucose-Capillary: 200 mg/dL — ABNORMAL HIGH (ref 70–99)
Glucose-Capillary: 177 mg/dL — ABNORMAL HIGH (ref 70–99)
Glucose-Capillary: 182 mg/dL — ABNORMAL HIGH (ref 70–99)

## 2020-06-16 LAB — MAGNESIUM: Magnesium: 2 mg/dL (ref 1.7–2.4)

## 2020-06-16 SURGERY — LAPAROSCOPIC CHOLECYSTECTOMY WITH INTRAOPERATIVE CHOLANGIOGRAM
Anesthesia: General

## 2020-06-16 MED ORDER — LACTATED RINGERS IV SOLN: INTRAVENOUS | Status: DC

## 2020-06-16 MED ORDER — ESMOLOL HCL 100 MG/10ML IV SOLN
INTRAVENOUS | Status: DC | PRN
  Administered 2020-06-16 (×2): 10 mg via INTRAVENOUS

## 2020-06-16 MED ORDER — BUPIVACAINE HCL (PF) 0.25 % IJ SOLN
INTRAMUSCULAR | Status: AC
  Filled 2020-06-16: qty 30

## 2020-06-16 MED ORDER — PHENYLEPHRINE HCL-NACL 10-0.9 MG/250ML-% IV SOLN
INTRAVENOUS | Status: DC | PRN
  Administered 2020-06-16: 25 ug/min via INTRAVENOUS

## 2020-06-16 MED ORDER — ESMOLOL HCL 100 MG/10ML IV SOLN
INTRAVENOUS | Status: AC
  Filled 2020-06-16: qty 10

## 2020-06-16 MED ORDER — LIDOCAINE-EPINEPHRINE (PF) 1 %-1:200000 IJ SOLN
INTRAMUSCULAR | Status: DC | PRN
  Administered 2020-06-16: 15.5 mL

## 2020-06-16 MED ORDER — HYDROMORPHONE HCL 1 MG/ML IJ SOLN: 0.2500 mg | INTRAMUSCULAR | Status: DC | PRN

## 2020-06-16 MED ORDER — FENTANYL CITRATE (PF) 250 MCG/5ML IJ SOLN
INTRAMUSCULAR | Status: AC
  Filled 2020-06-16: qty 5

## 2020-06-16 MED ORDER — FENTANYL CITRATE (PF) 250 MCG/5ML IJ SOLN
INTRAMUSCULAR | Status: DC | PRN
  Administered 2020-06-16 (×3): 50 ug via INTRAVENOUS
  Administered 2020-06-16: 100 ug via INTRAVENOUS

## 2020-06-16 MED ORDER — LIDOCAINE 2% (20 MG/ML) 5 ML SYRINGE
INTRAMUSCULAR | Status: AC
  Filled 2020-06-16: qty 5

## 2020-06-16 MED ORDER — SUGAMMADEX SODIUM 200 MG/2ML IV SOLN
INTRAVENOUS | Status: DC | PRN
  Administered 2020-06-16: 200 mg via INTRAVENOUS

## 2020-06-16 MED ORDER — PHENYLEPHRINE 40 MCG/ML (10ML) SYRINGE FOR IV PUSH (FOR BLOOD PRESSURE SUPPORT)
PREFILLED_SYRINGE | INTRAVENOUS | Status: DC | PRN
  Administered 2020-06-16: 40 ug via INTRAVENOUS
  Administered 2020-06-16: 80 ug via INTRAVENOUS
  Administered 2020-06-16: 40 ug via INTRAVENOUS

## 2020-06-16 MED ORDER — CHLORHEXIDINE GLUCONATE 0.12 % MT SOLN: 15.0000 mL | Freq: Once | OROMUCOSAL | Status: AC

## 2020-06-16 MED ORDER — LIDOCAINE 2% (20 MG/ML) 5 ML SYRINGE
INTRAMUSCULAR | Status: DC | PRN
  Administered 2020-06-16: 100 mg via INTRAVENOUS

## 2020-06-16 MED ORDER — CHLORHEXIDINE GLUCONATE 0.12 % MT SOLN
OROMUCOSAL | Status: AC
  Administered 2020-06-16: 15 mL via OROMUCOSAL
  Filled 2020-06-16: qty 15

## 2020-06-16 MED ORDER — ONDANSETRON HCL 4 MG/2ML IJ SOLN
INTRAMUSCULAR | Status: AC
  Filled 2020-06-16: qty 2

## 2020-06-16 MED ORDER — ONDANSETRON HCL 4 MG/2ML IJ SOLN
INTRAMUSCULAR | Status: DC | PRN
  Administered 2020-06-16: 4 mg via INTRAVENOUS

## 2020-06-16 MED ORDER — MORPHINE SULFATE (PF) 2 MG/ML IV SOLN
1.0000 mg | INTRAVENOUS | Status: DC | PRN
  Administered 2020-06-16: 2 mg via INTRAVENOUS
  Filled 2020-06-16: qty 1

## 2020-06-16 MED ORDER — SODIUM CHLORIDE 0.9 % IR SOLN
Status: DC | PRN
  Administered 2020-06-16: 1000 mL

## 2020-06-16 MED ORDER — DEXAMETHASONE SODIUM PHOSPHATE 10 MG/ML IJ SOLN
INTRAMUSCULAR | Status: AC
  Filled 2020-06-16: qty 1

## 2020-06-16 MED ORDER — DEXAMETHASONE SODIUM PHOSPHATE 10 MG/ML IJ SOLN
INTRAMUSCULAR | Status: DC | PRN
  Administered 2020-06-16: 10 mg via INTRAVENOUS

## 2020-06-16 MED ORDER — BUPIVACAINE HCL (PF) 0.25 % IJ SOLN
INTRAMUSCULAR | Status: DC | PRN
  Administered 2020-06-16: 15.5 mL

## 2020-06-16 MED ORDER — ROCURONIUM BROMIDE 10 MG/ML (PF) SYRINGE
PREFILLED_SYRINGE | INTRAVENOUS | Status: DC | PRN
  Administered 2020-06-16: 50 mg via INTRAVENOUS

## 2020-06-16 MED ORDER — PROPOFOL 10 MG/ML IV BOLUS
INTRAVENOUS | Status: DC | PRN
  Administered 2020-06-16 (×2): 50 mg via INTRAVENOUS

## 2020-06-16 MED ORDER — PROPOFOL 10 MG/ML IV BOLUS
INTRAVENOUS | Status: AC
  Filled 2020-06-16: qty 20

## 2020-06-16 MED ORDER — OXYCODONE HCL 5 MG PO TABS
5.0000 mg | ORAL_TABLET | ORAL | Status: DC | PRN
  Administered 2020-06-16 – 2020-06-17 (×4): 10 mg via ORAL
  Filled 2020-06-16 (×4): qty 2

## 2020-06-16 MED ORDER — 0.9 % SODIUM CHLORIDE (POUR BTL) OPTIME
TOPICAL | Status: DC | PRN
  Administered 2020-06-16: 1000 mL

## 2020-06-16 MED ORDER — ONDANSETRON HCL 4 MG/2ML IJ SOLN: 4.0000 mg | Freq: Once | INTRAMUSCULAR | Status: DC | PRN

## 2020-06-16 MED ORDER — LIDOCAINE-EPINEPHRINE 1 %-1:100000 IJ SOLN
INTRAMUSCULAR | Status: AC
  Filled 2020-06-16: qty 1

## 2020-06-16 MED ORDER — ROCURONIUM BROMIDE 10 MG/ML (PF) SYRINGE
PREFILLED_SYRINGE | INTRAVENOUS | Status: AC
  Filled 2020-06-16: qty 10

## 2020-06-16 MED ORDER — ARTIFICIAL TEARS OPHTHALMIC OINT
TOPICAL_OINTMENT | OPHTHALMIC | Status: AC
  Filled 2020-06-16: qty 3.5

## 2020-06-16 SURGICAL SUPPLY — 46 items
ADH SKN CLS APL DERMABOND .7 (GAUZE/BANDAGES/DRESSINGS) ×1
APL PRP STRL LF DISP 70% ISPRP (MISCELLANEOUS) ×1
APPLIER CLIP ROT 10 11.4 M/L (STAPLE) ×3
APR CLP MED LRG 11.4X10 (STAPLE) ×1
BAG SPEC RTRVL 10 TROC 200 (ENDOMECHANICALS) ×1
BLADE CLIPPER SURG (BLADE) IMPLANT
CANISTER SUCT 3000ML PPV (MISCELLANEOUS) ×3 IMPLANT
CHLORAPREP W/TINT 26 (MISCELLANEOUS) ×3 IMPLANT
CLIP APPLIE ROT 10 11.4 M/L (STAPLE) ×1 IMPLANT
CLIP VESOLOCK XL 6/CT (CLIP) IMPLANT
COVER MAYO STAND STRL (DRAPES) ×3 IMPLANT
COVER SURGICAL LIGHT HANDLE (MISCELLANEOUS) ×3 IMPLANT
DERMABOND ADVANCED (GAUZE/BANDAGES/DRESSINGS) ×2
DERMABOND ADVANCED .7 DNX12 (GAUZE/BANDAGES/DRESSINGS) ×1 IMPLANT
DRAPE C-ARM 42X120 X-RAY (DRAPES) ×1 IMPLANT
ELECT REM PT RETURN 9FT ADLT (ELECTROSURGICAL) ×3
ELECTRODE REM PT RTRN 9FT ADLT (ELECTROSURGICAL) ×1 IMPLANT
GLOVE BIO SURGEON STRL SZ 6 (GLOVE) ×11 IMPLANT
GLOVE INDICATOR 6.5 STRL GRN (GLOVE) ×7 IMPLANT
GOWN STRL REUS W/ TWL LRG LVL3 (GOWN DISPOSABLE) ×2 IMPLANT
GOWN STRL REUS W/TWL 2XL LVL3 (GOWN DISPOSABLE) ×3 IMPLANT
GOWN STRL REUS W/TWL LRG LVL3 (GOWN DISPOSABLE) ×9
HEMOSTAT SNOW SURGICEL 2X4 (HEMOSTASIS) ×2 IMPLANT
KIT BASIN OR (CUSTOM PROCEDURE TRAY) ×3 IMPLANT
KIT TURNOVER KIT B (KITS) ×3 IMPLANT
L-HOOK LAP DISP 36CM (ELECTROSURGICAL) ×3
LHOOK LAP DISP 36CM (ELECTROSURGICAL) ×1 IMPLANT
NS IRRIG 1000ML POUR BTL (IV SOLUTION) ×3 IMPLANT
PAD ARMBOARD 7.5X6 YLW CONV (MISCELLANEOUS) ×3 IMPLANT
PENCIL BUTTON HOLSTER BLD 10FT (ELECTRODE) ×3 IMPLANT
POUCH RETRIEVAL ECOSAC 10 (ENDOMECHANICALS) ×1 IMPLANT
POUCH RETRIEVAL ECOSAC 10MM (ENDOMECHANICALS) ×3
SCISSORS LAP 5X35 DISP (ENDOMECHANICALS) ×3 IMPLANT
SET CHOLANGIOGRAPH 5 50 .035 (SET/KITS/TRAYS/PACK) ×1 IMPLANT
SET IRRIG TUBING LAPAROSCOPIC (IRRIGATION / IRRIGATOR) ×3 IMPLANT
SET TUBE SMOKE EVAC HIGH FLOW (TUBING) ×3 IMPLANT
SLEEVE ENDOPATH XCEL 5M (ENDOMECHANICALS) ×3 IMPLANT
SPECIMEN JAR SMALL (MISCELLANEOUS) ×3 IMPLANT
SUT MNCRL AB 4-0 PS2 18 (SUTURE) ×3 IMPLANT
TOWEL GREEN STERILE (TOWEL DISPOSABLE) ×3 IMPLANT
TOWEL GREEN STERILE FF (TOWEL DISPOSABLE) ×3 IMPLANT
TRAY LAPAROSCOPIC MC (CUSTOM PROCEDURE TRAY) ×3 IMPLANT
TROCAR XCEL BLUNT TIP 100MML (ENDOMECHANICALS) ×3 IMPLANT
TROCAR XCEL NON-BLD 11X100MML (ENDOMECHANICALS) ×3 IMPLANT
TROCAR XCEL NON-BLD 5MMX100MML (ENDOMECHANICALS) ×3 IMPLANT
WARMER LAPAROSCOPE (MISCELLANEOUS) ×1 IMPLANT

## 2020-06-16 NOTE — Transfer of Care (Signed)
Immediate Anesthesia Transfer of Care Note  Patient: [name]  Procedure(s) Performed: LAPAROSCOPIC CHOLECYSTECTOMY (N/A )  Patient Location: PACU  Anesthesia Type:General  Level of Consciousness: awake, alert  and oriented  Airway & Oxygen Therapy: Patient Spontanous Breathing  Post-op Assessment: Report given to RN, Post -op Vital signs reviewed and stable and Patient moving all extremities X 4  Post vital signs: Reviewed and stable  Last Vitals:  Vitals Value Taken Time  BP 195/88 06/16/20 1202  Temp    Pulse 83 06/16/20 1205  Resp 14 06/16/20 1205  SpO2 97 % 06/16/20 1205  Vitals shown include unvalidated device data.  Last Pain:  Vitals:   06/15/20 2200  TempSrc:   PainSc: 0-No pain      Patients Stated Pain Goal: 0 (37/10/62 6948)  Complications: No complications documented.

## 2020-06-16 NOTE — Progress Notes (Signed)
Back from PACU. BP 200/67 with c/o surgical pain 8/10/ labetalol and morphine given

## 2020-06-16 NOTE — Interval H&P Note (Signed)
History and Physical Interval Note:  06/16/2020 10:13 AM  [name]  has presented today for surgery, with the diagnosis of CHOLELITHIASIS.  The various methods of treatment have been discussed with the patient and family. After consideration of risks, benefits and other options for treatment, the patient has consented to  Procedure(s): LAPAROSCOPIC CHOLECYSTECTOMY WITH POSSIBLE INTRAOPERATIVE CHOLANGIOGRAM (N/A) as a surgical intervention.  The patient's history has been reviewed, patient examined, no change in status, stable for surgery.  I have reviewed the patient's chart and labs.  Questions were answered to the patient's satisfaction.     Stark Klein

## 2020-06-16 NOTE — Op Note (Signed)
Laparoscopic Cholecystectomy  Indications: This patient presents with choledochonithiasis and will undergo laparoscopic cholecystectomy.  Pre-operative Diagnosis: choledocholithiasis  Post-operative Diagnosis: Same with mild acute cholecystitis  Surgeon: Stark Klein   Assistants: n/a  Anesthesia: General endotracheal anesthesia and local  ASA Class: 3  Procedure Details  The patient was seen again in the Holding Room. The risks, benefits, complications, treatment options, and expected outcomes were discussed with the patient. The possibilities of  bleeding, recurrent infection, damage to nearby structures, the need for additional procedures, failure to diagnose a condition, the possible need to convert to an open procedure, and creating a complication requiring transfusion or operation were discussed with the patient. The likelihood of improving the patient's symptoms with return to their baseline status is good.    The patient and/or family concurred with the proposed plan, giving informed consent. The site of surgery properly noted. The patient was taken to Operating Room, and the procedure verified as Laparoscopic Cholecystectomy with possible Intraoperative Cholangiogram. A Time Out was held and the above information confirmed.  Prior to the induction of general anesthesia, antibiotic prophylaxis was administered. General endotracheal anesthesia was then administered and tolerated well. After the induction, the abdomen was prepped with Chloraprep and draped in the sterile fashion. The patient was positioned in the supine position.  Local anesthetic agent was injected into the skin near the umbilicus and a transverse curvilinear infraumbilical incision was made with a #11 blade. I dissected down to the abdominal fascia with blunt dissection.  The fascia was incised vertically and we entered the peritoneal cavity bluntly.  A pursestring suture of 0-Vicryl was placed around the fascial  opening.  The Hasson cannula was inserted and secured with the stay suture.  Pneumoperitoneum was then created with CO2 and tolerated well without any adverse changes in the patient's vital signs. An 11-mm port was placed in the subxiphoid position.  Two 5-mm ports were placed in the right upper quadrant. All skin incisions were infiltrated with a local anesthetic agent before making the incision and placing the trocars.   We positioned the patient in reverse Trendelenburg, tilted slightly to the patient's left.  The gallbladder was identified, the fundus grasped and retracted cephalad. Adhesions were lysed bluntly and with the electrocautery where indicated, taking care not to injure any adjacent organs or viscus. The infundibulum was grasped and retracted laterally, exposing the peritoneum overlying the triangle of Calot. This was then divided and exposed in a blunt fashion. A critical view of the cystic duct and cystic artery was obtained.  The cystic duct was clearly identified and bluntly dissected circumferentially. The node of calot was visible over the very anterior cystic artery.  This was clipped and divided.  The cystic duct was then skeletonized.  It was clipped distally and opened proximally, sweeping the cystic duct to look for any possible stones.  None were found.  The cystic duct was then doubly clipped with locking Weck clips.    The gallbladder was dissected from the liver bed in retrograde fashion with the electrocautery. The gallbladder was removed and placed in an Ecosac.  The gallbladder and Ecosac were then removed through the umbilical port site.  The liver bed was irrigated and inspected. Hemostasis was achieved with the electrocautery. SNOW hemostatic agent was placed in the gallbladder fossa since he will need to restart anticoagulation.  Copious irrigation was utilized and was repeatedly aspirated until clear.    We again inspected the right upper quadrant for hemostasis.   Pneumoperitoneum was  released as we removed the trocars.   The pursestring suture was used to close the umbilical fascia. An additional simple suture was placed in the fascia superiorly to make sure the defect was fully closed.   4-0 Monocryl was used to close the skin.   The skin was cleaned and dry, and Dermabond was applied. The patient was then extubated and brought to the recovery room in stable condition. Instrument, sponge, and needle counts were correct at closure and at the conclusion of the case.   Findings: Mild acute cholecystitis .    Estimated Blood Loss: min         Drains: none          Specimens: Gallbladder to pathology       Complications: None; patient tolerated the procedure well.         Disposition: PACU - hemodynamically stable.         Condition: stable

## 2020-06-16 NOTE — Anesthesia Postprocedure Evaluation (Signed)
Anesthesia Post Note  Patient: [name]  Procedure(s) Performed: LAPAROSCOPIC CHOLECYSTECTOMY (N/A )     Patient location during evaluation: PACU Anesthesia Type: General Level of consciousness: sedated and patient cooperative Pain management: pain level controlled Vital Signs Assessment: post-procedure vital signs reviewed and stable Respiratory status: spontaneous breathing Cardiovascular status: stable Anesthetic complications: no   No complications documented.  Last Vitals:  Vitals:   06/16/20 1613 06/16/20 1625  BP: (!) 161/67 (!) 155/64  Pulse: 60 (!) 59  Resp: 12 18  Temp:  (!) 36.3 C  SpO2: 98% 97%    Last Pain:  Vitals:   06/16/20 2015  TempSrc:   PainSc: 0-No pain                 Nolon Nations

## 2020-06-16 NOTE — Progress Notes (Signed)
     [facility] Progress Note  CC:  Ready for surgery  Assessment / Plan: Choledocholithasis with E coli bacteremia and cholangitis s/p successful ERCP with sphincterotomy and balloon trawl 06/15/20. Slight increase in T bili, AST, and ALT following ERCP.  Asymptomatic following the procedure.   - To the OR this morning - Follow liver enzymes on discharge until they normalize  GI will move to stand-by. Please call the on-call gastroenterologist with any additional questions or concerns during this hospitalization.    Subjective: Seen as the OR team was preparing him to go to the OR. No complaints following his ERCP.   Objective:  Vital signs in last 24 hours: Temp:  [97.5 F (36.4 C)-97.9 F (36.6 C)] 97.5 F (36.4 C) (11/12 1349) Pulse Rate:  [55-80] 57 (11/12 1349) Resp:  [13-17] 17 (11/12 1349) BP: (141-215)/(56-86) 141/56 (11/12 2206) SpO2:  [97 %-98 %] 97 % (11/12 0914) Last BM Date: 06/14/20 General:   Alert, in NAD Abdomen:  Soft. Nontender. Nondistended. Normal bowel sounds. No rebound or guarding. Neurologic:  Alert and  oriented x4;  grossly normal neurologically. Psych:  Alert and cooperative. Normal mood and affect.  Lab Results: Recent Labs    06/14/20 0139 06/15/20 0152 06/16/20 0437  WBC 6.0 5.8 10.7*  HGB 11.4* 10.7* 11.2*  HCT 34.2* 32.3* 32.8*  PLT 221 202 247   BMET Recent Labs    06/14/20 0139 06/15/20 0152 06/16/20 0437  NA 137 139 137  K 3.9 3.9 4.0  CL 106 108 108  CO2 21* 21* 20*  GLUCOSE 213* 228* 126*  BUN 24* 26* 26*  CREATININE 1.61* 1.56* 1.56*  CALCIUM 8.3* 8.3* 8.5*   LFT Recent Labs    06/16/20 0437  PROT 5.9*  ALBUMIN 2.6*  AST 69*  ALT 104*  ALKPHOS 68  BILITOT 1.2   PT/INR No results for input(s): LABPROT, INR in the last 72 hours. Hepatitis Panel No results for input(s): HEPBSAG, HCVAB, HEPAIGM, HEPBIGM in the last 72 hours.  DG ERCP BILIARY & PANCREATIC DUCTS  Result Date:  06/15/2020 CLINICAL DATA:  ERCP for stones. EXAM: ERCP TECHNIQUE: Multiple spot images obtained with the fluoroscopic device and submitted for interpretation post-procedure. COMPARISON:  MRCP-06/12/2020 FLUOROSCOPY TIME:  3 minutes, 12 seconds (35.7 mGy) FINDINGS: Several spot fluoroscopic images the right upper abdominal quadrant during ERCP are provided for review Initial image demonstrates an ERCP probe overlying the right upper abdominal quadrant. Subsequent images demonstrate selective cannulation and opacification of the CBD. There are several nonocclusive filling defects seen within the mid and distal aspect of the CBD suggestive of choledocholithiasis seen on preceding MRCP. Subsequent images demonstrate insufflation of a balloon within the central aspect of the CBD with subsequent biliary sweeping and presumed sphincterotomy. There is minimal opacification of the pancreatic duct as well as the central aspect of the intrahepatic biliary tree which appears nondilated. There is no definitive opacification of the cystic duct. IMPRESSION: ERCP with biliary sweeping and presumed sphincterotomy as above. These images were submitted for radiologic interpretation only. Please see the procedural report for the amount of contrast and the fluoroscopy time utilized. Electronically Signed   By: Sandi Mariscal M.D.   On: 06/15/2020 09:02      LOS: 9 days   [place]  06/16/2020, 8:46 AM

## 2020-06-16 NOTE — Progress Notes (Signed)
PROGRESS NOTE    [name]   FTD:[id number]  DOB: 1946/02/22  DOA: 06/07/2020     9  PCP: Christian Peng, NP  CC: abdominal pain  Hospital Course: [name] is a 74 year old male with history of coronary disease status post recent left heart cath (05/28/20) with successful PTCA to distal SVG to diagonal in-stent restenosis (on asa/brilinta PTA), diabetes type 2, CKD3b, hypertension, HIV, chronic diastolic congestive heart failure, BPH who presented to the emergency department with complaints of abdominal pain mainly in the epigastric region and right upper quadrant.   In the emergency department, he also had fever and was tachycardic.  Patient was found to have elevated liver enzymes on presentation, elevated bilirubin, elevated lactate, elevated troponin.   CT abdomen/pelvis showed cholelithiasis, multiple stones in the distal CBD resulting in mild CBD dilation. Patient was admitted for the management of sepsis secondary to cholangitis.  GI, general surgery, cardiology following.    He was also positive for E. coli bacteremia which was presumed translocation from GI source.  He was treated with Rocephin during hospitalization.  Unsuccessful ERCP on 06/11/20 but stent placed in pancreatic duct.   Repeat ERCP on 11/12 was successful and stones were removed. He then underwent a lap CCY on 06/16/20.   Interval History:  Underwent cholecystectomy this morning.  Seen in his room after surgery resting comfortably and still drowsy from anesthesia.  Old records reviewed in assessment of this patient  ROS: Constitutional: negative for chills and fevers, Respiratory: negative for cough, Cardiovascular: negative for chest pain and Gastrointestinal: negative for abdominal pain, constipation and diarrhea  Assessment & Plan: * Severe sepsis (HCC)-resolved as of 06/14/2020 Fever, tachycardia, lactic acidosis, AKI, leukocytosis.  Work-up consistent with choledocholithiasis and cholangitis.  Also  developed E. coli bacteremia from presumed translocation of GI source - Rocephin further narrowed to Ancef; continue to complete 2 week course total (can transition to PO after surgery and he remains stable) -If fever, will need repeat blood cultures  Choledocholithiasis -Patient presented with epigastric pain.  Technical difficulty with ERCP.  MRCP was repeated on 06/12/2020 which reveals cholelithiasis, choledocholithiasis with multiple calculi seen within dilated CBD - repeat ERCP done on 11/12; stones successfully removed - now s/p lap CCY on 06/23  Chronic diastolic CHF (congestive heart failure) (HCC) - lasix on hold.   - initially on gentle IV fluids due to concern for sepsis, dehydration and AKI.  Continue current medications.Echo done on this admission showed ejection fraction of 60 to 65%, no wall motion abnormality - IVF now off  Coronary artery disease - s/p PTCA on 10/25 for in-stent restenosis. Was discharged on asa/brilinta - brilinta on hold for procedures/surgery; if stable will likely resume on 11/14 - continue asa, lopressor - statin on hold for now   Type 2 diabetes mellitus with stage 3b chronic kidney disease, without long-term current use of insulin (HCC) - continue SSI and CBG monitoring   Acute renal failure superimposed on stage 3b chronic kidney disease ([place]) Presented with creatinine in the range of 2.  Baseline creatinine ranges from 1.5-1.8.  Kidney function improved with IV fluids.  HIV disease ([place]) - continue home meds   Antimicrobials: Rocephin 06/07/2020>> 06/15/2020 Ancef 06/15/2020>> present  DVT prophylaxis: SCD Code Status: Full Family Communication: None present Disposition Plan: Status is: Inpatient  Remains inpatient appropriate because:Ongoing diagnostic testing needed not appropriate for outpatient work up, Unsafe d/c plan, IV treatments appropriate due to intensity of illness or inability to take PO  and Inpatient level of care  appropriate due to severity of illness   Dispo: The patient is from: Home              Anticipated d/c is to: Home              Anticipated d/c date is: > 3 days              Patient currently is not medically stable to d/c.  Objective: Blood pressure (!) 183/70, pulse (!) 58, temperature 98 F (36.7 C), resp. rate 13, height 5\' 3"  (1.6 m), weight 61.2 kg, SpO2 93 %.  Examination: General appearance: alert, cooperative and no distress Head: Normocephalic, without obvious abnormality, atraumatic Eyes: EOMI Lungs: clear to auscultation bilaterally Heart: regular rate and rhythm and S1, S2 normal Abdomen: normal findings: bowel sounds normal and soft, non-tender Extremities: No edema Skin: mobility and turgor normal Neurologic: Grossly normal  Consultants:   GI  Surgery  Cardiology  Procedures:   06/11/2020: ERCP  06/15/2020: ERCP, 2nd  Data Reviewed: I have personally reviewed following labs and imaging studies Results for orders placed or performed during the hospital encounter of 06/07/20 (from the past 24 hour(s))  Glucose, capillary     Status: Abnormal   Collection Time: 06/15/20  3:54 PM  Result Value Ref Range   Glucose-Capillary 357 (H) 70 - 99 mg/dL  Glucose, capillary     Status: Abnormal   Collection Time: 06/15/20  9:48 PM  Result Value Ref Range   Glucose-Capillary 206 (H) 70 - 99 mg/dL  CBC with Differential/Platelet     Status: Abnormal   Collection Time: 06/16/20  4:37 AM  Result Value Ref Range   WBC 10.7 (H) 4.0 - 10.5 K/uL   RBC 3.51 (L) 4.22 - 5.81 MIL/uL   Hemoglobin 11.2 (L) 13.0 - 17.0 g/dL   HCT 32.8 (L) 39 - 52 %   MCV 93.4 80.0 - 100.0 fL   MCH 31.9 26.0 - 34.0 pg   MCHC 34.1 30.0 - 36.0 g/dL   RDW 13.2 11.5 - 15.5 %   Platelets 247 150 - 400 K/uL   nRBC 0.0 0.0 - 0.2 %   Neutrophils Relative % 85 %   Neutro Abs 9.2 (H) 1.7 - 7.7 K/uL   Lymphocytes Relative 7 %   Lymphs Abs 0.7 0.7 - 4.0 K/uL   Monocytes Relative 6 %   Monocytes  Absolute 0.6 0.1 - 1.0 K/uL   Eosinophils Relative 1 %   Eosinophils Absolute 0.1 0.0 - 0.5 K/uL   Basophils Relative 0 %   Basophils Absolute 0.0 0.0 - 0.1 K/uL   Immature Granulocytes 1 %   Abs Immature Granulocytes 0.07 0.00 - 0.07 K/uL  Comprehensive metabolic panel     Status: Abnormal   Collection Time: 06/16/20  4:37 AM  Result Value Ref Range   Sodium 137 135 - 145 mmol/L   Potassium 4.0 3.5 - 5.1 mmol/L   Chloride 108 98 - 111 mmol/L   CO2 20 (L) 22 - 32 mmol/L   Glucose, Bld 126 (H) 70 - 99 mg/dL   BUN 26 (H) 8 - 23 mg/dL   Creatinine, Ser 1.56 (H) 0.61 - 1.24 mg/dL   Calcium 8.5 (L) 8.9 - 10.3 mg/dL   Total Protein 5.9 (L) 6.5 - 8.1 g/dL   Albumin 2.6 (L) 3.5 - 5.0 g/dL   AST 69 (H) 15 - 41 U/L   ALT 104 (H) 0 - 44 U/L  Alkaline Phosphatase 68 38 - 126 U/L   Total Bilirubin 1.2 0.3 - 1.2 mg/dL   GFR, Estimated 47 (L) >60 mL/min   Anion gap 9 5 - 15  Magnesium     Status: None   Collection Time: 06/16/20  4:37 AM  Result Value Ref Range   Magnesium 2.0 1.7 - 2.4 mg/dL  Glucose, capillary     Status: Abnormal   Collection Time: 06/16/20  6:29 AM  Result Value Ref Range   Glucose-Capillary 103 (H) 70 - 99 mg/dL  Glucose, capillary     Status: None   Collection Time: 06/16/20  9:17 AM  Result Value Ref Range   Glucose-Capillary 96 70 - 99 mg/dL  Glucose, capillary     Status: Abnormal   Collection Time: 06/16/20 12:06 PM  Result Value Ref Range   Glucose-Capillary 213 (H) 70 - 99 mg/dL  Glucose, capillary     Status: Abnormal   Collection Time: 06/16/20 12:49 PM  Result Value Ref Range   Glucose-Capillary 200 (H) 70 - 99 mg/dL   *Note: Due to a large number of results and/or encounters for the requested time period, some results have not been displayed. A complete set of results can be found in Results Review.    Recent Results (from the past 240 hour(s))  Respiratory Panel by RT PCR (Flu A&B, Covid) - Nasopharyngeal Swab     Status: None   Collection Time:  06/07/20  5:51 AM   Specimen: Nasopharyngeal Swab  Result Value Ref Range Status   SARS Coronavirus 2 by RT PCR NEGATIVE NEGATIVE Final    Comment: (NOTE) SARS-CoV-2 target nucleic acids are NOT DETECTED.  The SARS-CoV-2 RNA is generally detectable in upper respiratoy specimens during the acute phase of infection. The lowest concentration of SARS-CoV-2 viral copies this assay can detect is 131 copies/mL. A negative result does not preclude SARS-Cov-2 infection and should not be used as the sole basis for treatment or other patient management decisions. A negative result may occur with  improper specimen collection/handling, submission of specimen other than nasopharyngeal swab, presence of viral mutation(s) within the areas targeted by this assay, and inadequate number of viral copies (<131 copies/mL). A negative result must be combined with clinical observations, patient history, and epidemiological information. The expected result is Negative.  Fact Sheet for Patients:  PinkCheek.be  Fact Sheet for Healthcare Providers:  GravelBags.it  This test is no t yet approved or cleared by the [place] FDA and  has been authorized for detection and/or diagnosis of SARS-CoV-2 by FDA under an Emergency Use Authorization (EUA). This EUA will remain  in effect (meaning this test can be used) for the duration of the COVID-19 declaration under Section 564(b)(1) of the Act, 21 U.S.C. section 360bbb-3(b)(1), unless the authorization is terminated or revoked sooner.     Influenza A by PCR NEGATIVE NEGATIVE Final   Influenza B by PCR NEGATIVE NEGATIVE Final    Comment: (NOTE) The Xpert Xpress SARS-CoV-2/FLU/RSV assay is intended as an aid in  the diagnosis of influenza from Nasopharyngeal swab specimens and  should not be used as a sole basis for treatment. Nasal washings and  aspirates are unacceptable for Xpert Xpress  SARS-CoV-2/FLU/RSV  testing.  Fact Sheet for Patients: PinkCheek.be  Fact Sheet for Healthcare Providers: GravelBags.it  This test is not yet approved or cleared by the [place] FDA and  has been authorized for detection and/or diagnosis of SARS-CoV-2 by  FDA under an Emergency Use  Authorization (EUA). This EUA will remain  in effect (meaning this test can be used) for the duration of the  Covid-19 declaration under Section 564(b)(1) of the Act, 21  U.S.C. section 360bbb-3(b)(1), unless the authorization is  terminated or revoked. Performed at [facility], [place] [address], [place], [place] [address]   Blood culture (routine single)     Status: Abnormal   Collection Time: 06/07/20  5:52 AM   Specimen: BLOOD  Result Value Ref Range Status   Specimen Description BLOOD SITE NOT SPECIFIED  Final   Special Requests   Final    BOTTLES DRAWN AEROBIC AND ANAEROBIC Blood Culture adequate volume   Culture  Setup Time   Final    GRAM NEGATIVE RODS IN BOTH AEROBIC AND ANAEROBIC BOTTLES CRITICAL RESULT CALLED TO, READ BACK BY AND VERIFIED WITH: PHARMD G ABBOTT 06/07/20 AT 2313 SK Performed at [facility], [place] [address], [place], [place] [address]    Culture ESCHERICHIA COLI (A)  Final   Report Status 06/09/2020 FINAL  Final   Organism ID, Bacteria ESCHERICHIA COLI  Final      Susceptibility   Escherichia coli - MIC*    AMPICILLIN <=2 SENSITIVE Sensitive     CEFAZOLIN <=4 SENSITIVE Sensitive     CEFEPIME <=0.12 SENSITIVE Sensitive     CEFTAZIDIME <=1 SENSITIVE Sensitive     CEFTRIAXONE <=0.25 SENSITIVE Sensitive     CIPROFLOXACIN <=0.25 SENSITIVE Sensitive     GENTAMICIN <=1 SENSITIVE Sensitive     IMIPENEM <=0.25 SENSITIVE Sensitive     TRIMETH/SULFA <=20 SENSITIVE Sensitive     AMPICILLIN/SULBACTAM <=2 SENSITIVE Sensitive     PIP/TAZO <=4 SENSITIVE Sensitive     * ESCHERICHIA COLI  Blood Culture ID Panel  (Reflexed)     Status: Abnormal   Collection Time: 06/07/20  5:52 AM  Result Value Ref Range Status   Enterococcus faecalis NOT DETECTED NOT DETECTED Final   Enterococcus Faecium NOT DETECTED NOT DETECTED Final   Listeria monocytogenes NOT DETECTED NOT DETECTED Final   Staphylococcus species NOT DETECTED NOT DETECTED Final   Staphylococcus aureus (BCID) NOT DETECTED NOT DETECTED Final   Staphylococcus epidermidis NOT DETECTED NOT DETECTED Final   Staphylococcus lugdunensis NOT DETECTED NOT DETECTED Final   Streptococcus species NOT DETECTED NOT DETECTED Final   Streptococcus agalactiae NOT DETECTED NOT DETECTED Final   Streptococcus pneumoniae NOT DETECTED NOT DETECTED Final   Streptococcus pyogenes NOT DETECTED NOT DETECTED Final   A.calcoaceticus-baumannii NOT DETECTED NOT DETECTED Final   Bacteroides fragilis NOT DETECTED NOT DETECTED Final   Enterobacterales DETECTED (A) NOT DETECTED Final    Comment: Enterobacterales represent a large order of gram negative bacteria, not a single organism. CRITICAL RESULT CALLED TO, READ BACK BY AND VERIFIED WITH: PHARMD G ABBOTT 06/07/20 AT 2313 SK    Enterobacter cloacae complex NOT DETECTED NOT DETECTED Final   Escherichia coli DETECTED (A) NOT DETECTED Final    Comment: CRITICAL RESULT CALLED TO, READ BACK BY AND VERIFIED WITH: PHARMD G ABBOTT 06/07/20 AT 2313 SK    Klebsiella aerogenes NOT DETECTED NOT DETECTED Final   Klebsiella oxytoca NOT DETECTED NOT DETECTED Final   Klebsiella pneumoniae NOT DETECTED NOT DETECTED Final   Proteus species NOT DETECTED NOT DETECTED Final   Salmonella species NOT DETECTED NOT DETECTED Final   Serratia marcescens NOT DETECTED NOT DETECTED Final   Haemophilus influenzae NOT DETECTED NOT DETECTED Final   Neisseria meningitidis NOT DETECTED NOT DETECTED Final   Pseudomonas  aeruginosa NOT DETECTED NOT DETECTED Final   Stenotrophomonas maltophilia NOT DETECTED NOT DETECTED Final   Candida albicans NOT  DETECTED NOT DETECTED Final   Candida auris NOT DETECTED NOT DETECTED Final   Candida glabrata NOT DETECTED NOT DETECTED Final   Candida krusei NOT DETECTED NOT DETECTED Final   Candida parapsilosis NOT DETECTED NOT DETECTED Final   Candida tropicalis NOT DETECTED NOT DETECTED Final   Cryptococcus neoformans/gattii NOT DETECTED NOT DETECTED Final   CTX-M ESBL NOT DETECTED NOT DETECTED Final   Carbapenem resistance IMP NOT DETECTED NOT DETECTED Final   Carbapenem resistance KPC NOT DETECTED NOT DETECTED Final   Carbapenem resistance NDM NOT DETECTED NOT DETECTED Final   Carbapenem resist OXA 48 LIKE NOT DETECTED NOT DETECTED Final   Carbapenem resistance VIM NOT DETECTED NOT DETECTED Final    Comment: Performed at [facility], [address] [place], [place] [address]  Gastrointestinal Panel by PCR , Stool     Status: None   Collection Time: 06/08/20 10:03 AM   Specimen: Stool  Result Value Ref Range Status   Campylobacter species NOT DETECTED NOT DETECTED Final   Plesimonas shigelloides NOT DETECTED NOT DETECTED Final   Salmonella species NOT DETECTED NOT DETECTED Final   Yersinia enterocolitica NOT DETECTED NOT DETECTED Final   Vibrio species NOT DETECTED NOT DETECTED Final   Vibrio cholerae NOT DETECTED NOT DETECTED Final   Enteroaggregative E coli (EAEC) NOT DETECTED NOT DETECTED Final   Enteropathogenic E coli (EPEC) NOT DETECTED NOT DETECTED Final   Enterotoxigenic E coli (ETEC) NOT DETECTED NOT DETECTED Final   Shiga like toxin producing E coli (STEC) NOT DETECTED NOT DETECTED Final   Shigella/Enteroinvasive E coli (EIEC) NOT DETECTED NOT DETECTED Final   Cryptosporidium NOT DETECTED NOT DETECTED Final   Cyclospora cayetanensis NOT DETECTED NOT DETECTED Final   Entamoeba histolytica NOT DETECTED NOT DETECTED Final   Giardia lamblia NOT DETECTED NOT DETECTED Final   Adenovirus F40/41 NOT DETECTED NOT DETECTED Final   Astrovirus NOT DETECTED NOT DETECTED Final    Norovirus GI/GII NOT DETECTED NOT DETECTED Final   Rotavirus A NOT DETECTED NOT DETECTED Final   Sapovirus (I, II, IV, and V) NOT DETECTED NOT DETECTED Final    Comment: Performed at [facility], [address], [place], [place] [address]     Radiology Studies: DG ERCP BILIARY & PANCREATIC DUCTS  Result Date: 06/15/2020 CLINICAL DATA:  ERCP for stones. EXAM: ERCP TECHNIQUE: Multiple spot images obtained with the fluoroscopic device and submitted for interpretation post-procedure. COMPARISON:  MRCP-06/12/2020 FLUOROSCOPY TIME:  3 minutes, 12 seconds (35.7 mGy) FINDINGS: Several spot fluoroscopic images the right upper abdominal quadrant during ERCP are provided for review Initial image demonstrates an ERCP probe overlying the right upper abdominal quadrant. Subsequent images demonstrate selective cannulation and opacification of the CBD. There are several nonocclusive filling defects seen within the mid and distal aspect of the CBD suggestive of choledocholithiasis seen on preceding MRCP. Subsequent images demonstrate insufflation of a balloon within the central aspect of the CBD with subsequent biliary sweeping and presumed sphincterotomy. There is minimal opacification of the pancreatic duct as well as the central aspect of the intrahepatic biliary tree which appears nondilated. There is no definitive opacification of the cystic duct. IMPRESSION: ERCP with biliary sweeping and presumed sphincterotomy as above. These images were submitted for radiologic interpretation only. Please see the procedural report for the amount of contrast and the fluoroscopy time utilized. Electronically Signed   By:  Sandi Mariscal M.D.   On: 06/15/2020 09:02   DG ERCP BILIARY & PANCREATIC DUCTS  Final Result    MR ABDOMEN MRCP W WO CONTAST  Final Result    DG Abd 1 View - KUB  Final Result    DG C-Arm 1-60 Min  Final Result    CT ABDOMEN PELVIS WO CONTRAST  Final Result    US Abdomen Limited RUQ  (LIVER/GB)  Final Result    DG Chest 2 View  Final Result      Scheduled Meds: . acidophilus  1 capsule Oral Q breakfast  . aspirin EC  81 mg Oral Daily  . B-complex with vitamin C  1 tablet Oral Q breakfast  . cholecalciferol  2,000 Units Oral q AM  . dolutegravir  50 mg Oral q AM  . doravirine  100 mg Oral q AM  . insulin aspart  0-15 Units Subcutaneous TID WC  . isosorbide mononitrate  30 mg Oral Daily  . metoprolol tartrate  12.5 mg Oral BID  . pantoprazole  40 mg Oral QAC breakfast  . potassium chloride SA  20 mEq Oral Daily  . sodium bicarbonate  650 mg Oral TID  . tamsulosin  0.4 mg Oral Daily  . valACYclovir  500 mg Oral q AM   PRN Meds: labetalol, loperamide, morphine injection, nitroGLYCERIN, ondansetron **OR** ondansetron (ZOFRAN) IV, oxyCODONE Continuous Infusions: .  ceFAZolin (ANCEF) IV 2 g (06/15/20 2219)     LOS: 9 days  Time spent: Greater than 50% of the 35 minute visit was spent in counseling/coordination of care for the patient as laid out in the A&P.   Dwyane Dee, MD [facility] 06/16/2020, 2:59 PM

## 2020-06-16 NOTE — Plan of Care (Signed)
  Problem: Health Behavior/Discharge Planning: Goal: Ability to manage health-related needs will improve Outcome: Progressing   Problem: Clinical Measurements: Goal: Ability to maintain clinical measurements within normal limits will improve Outcome: Progressing Goal: Will remain free from infection Outcome: Progressing Goal: Diagnostic test results will improve Outcome: Progressing Goal: Respiratory complications will improve Outcome: Progressing Goal: Cardiovascular complication will be avoided Outcome: Progressing   Problem: Nutrition: Goal: Adequate nutrition will be maintained Outcome: Progressing   Problem: Coping: Goal: Level of anxiety will decrease Outcome: Progressing   Problem: Elimination: Goal: Will not experience complications related to bowel motility Outcome: Progressing Goal: Will not experience complications related to urinary retention Outcome: Progressing   Problem: Pain Managment: Goal: General experience of comfort will improve Outcome: Progressing

## 2020-06-16 NOTE — Anesthesia Procedure Notes (Signed)
Procedure Name: Intubation Date/Time: 06/16/2020 10:32 AM Performed by: Kyung Rudd, CRNA Pre-anesthesia Checklist: Patient identified, Emergency Drugs available, Suction available and Patient being monitored Patient Re-evaluated:Patient Re-evaluated prior to induction Oxygen Delivery Method: Circle system utilized Preoxygenation: Pre-oxygenation with 100% oxygen Induction Type: IV induction Ventilation: Mask ventilation without difficulty and Oral airway inserted - appropriate to patient size Laryngoscope Size: Mac and 4 Grade View: Grade I Tube type: Oral Tube size: 7.5 mm Number of attempts: 1 Airway Equipment and Method: Stylet Placement Confirmation: ETT inserted through vocal cords under direct vision,  positive ETCO2 and breath sounds checked- equal and bilateral Secured at: 21 cm Tube secured with: Tape Dental Injury: Teeth and Oropharynx as per pre-operative assessment

## 2020-06-17 ENCOUNTER — Encounter (HOSPITAL_COMMUNITY): Payer: Self-pay | Admitting: General Surgery

## 2020-06-17 LAB — CBC WITH DIFFERENTIAL/PLATELET
Abs Immature Granulocytes: 0.06 10*3/uL (ref 0.00–0.07)
Basophils Absolute: 0 10*3/uL (ref 0.0–0.1)
Basophils Relative: 0 %
Eosinophils Absolute: 0 10*3/uL (ref 0.0–0.5)
Eosinophils Relative: 0 %
HCT: 33.9 % — ABNORMAL LOW (ref 39.0–52.0)
Hemoglobin: 11.4 g/dL — ABNORMAL LOW (ref 13.0–17.0)
Immature Granulocytes: 0 %
Lymphocytes Relative: 3 %
Lymphs Abs: 0.4 10*3/uL — ABNORMAL LOW (ref 0.7–4.0)
MCH: 31.8 pg (ref 26.0–34.0)
MCHC: 33.6 g/dL (ref 30.0–36.0)
MCV: 94.7 fL (ref 80.0–100.0)
Monocytes Absolute: 0.8 10*3/uL (ref 0.1–1.0)
Monocytes Relative: 6 %
Neutro Abs: 12.8 10*3/uL — ABNORMAL HIGH (ref 1.7–7.7)
Neutrophils Relative %: 91 %
Platelets: 252 10*3/uL (ref 150–400)
RBC: 3.58 MIL/uL — ABNORMAL LOW (ref 4.22–5.81)
RDW: 13.7 % (ref 11.5–15.5)
WBC: 14.1 10*3/uL — ABNORMAL HIGH (ref 4.0–10.5)
nRBC: 0 % (ref 0.0–0.2)

## 2020-06-17 LAB — GLUCOSE, CAPILLARY
Glucose-Capillary: 136 mg/dL — ABNORMAL HIGH (ref 70–99)
Glucose-Capillary: 145 mg/dL — ABNORMAL HIGH (ref 70–99)
Glucose-Capillary: 122 mg/dL — ABNORMAL HIGH (ref 70–99)
Glucose-Capillary: 149 mg/dL — ABNORMAL HIGH (ref 70–99)

## 2020-06-17 LAB — COMPREHENSIVE METABOLIC PANEL
ALT: 138 U/L — ABNORMAL HIGH (ref 0–44)
AST: 164 U/L — ABNORMAL HIGH (ref 15–41)
Albumin: 2.5 g/dL — ABNORMAL LOW (ref 3.5–5.0)
Alkaline Phosphatase: 75 U/L (ref 38–126)
Anion gap: 8 (ref 5–15)
BUN: 22 mg/dL (ref 8–23)
CO2: 22 mmol/L (ref 22–32)
Calcium: 8.3 mg/dL — ABNORMAL LOW (ref 8.9–10.3)
Chloride: 106 mmol/L (ref 98–111)
Creatinine, Ser: 1.4 mg/dL — ABNORMAL HIGH (ref 0.61–1.24)
GFR, Estimated: 53 mL/min — ABNORMAL LOW (ref >60)
Glucose, Bld: 221 mg/dL — ABNORMAL HIGH (ref 70–99)
Potassium: 4.9 mmol/L (ref 3.5–5.1)
Sodium: 136 mmol/L (ref 135–145)
Total Bilirubin: 0.9 mg/dL (ref 0.3–1.2)
Total Protein: 5.8 g/dL — ABNORMAL LOW (ref 6.5–8.1)

## 2020-06-17 LAB — MAGNESIUM: Magnesium: 2.1 mg/dL (ref 1.7–2.4)

## 2020-06-17 LAB — LIPASE, BLOOD: Lipase: 29 U/L (ref 11–51)

## 2020-06-17 MED ORDER — TICAGRELOR 90 MG PO TABS
90.0000 mg | ORAL_TABLET | Freq: Two times a day (BID) | ORAL | Status: DC
  Administered 2020-06-18: 90 mg via ORAL
  Filled 2020-06-17: qty 1

## 2020-06-17 MED ORDER — OXYCODONE HCL 5 MG PO TABS: 5.0000 mg | ORAL_TABLET | Freq: Four times a day (QID) | ORAL | 0 refills | Status: AC | PRN

## 2020-06-17 MED ORDER — ONDANSETRON HCL 4 MG PO TABS: 4.0000 mg | ORAL_TABLET | Freq: Four times a day (QID) | ORAL | 0 refills | Status: AC | PRN

## 2020-06-17 NOTE — Plan of Care (Signed)

## 2020-06-17 NOTE — Progress Notes (Signed)
Progress Note  1 Day Post-Op  Subjective: Patient reports some nausea but it is improving from yesterday post-op. He is tolerating clears and passing flatus, feels like he may need to have a BM. Abdomen is sore but not too painful. He would like to go home today.   Objective: Vital signs in last 24 hours: Temp:  [97.4 F (36.3 C)-98 F (36.7 C)] 98 F (36.7 C) (11/14 0445) Pulse Rate:  [55-85] 61 (11/14 0445) Resp:  [11-18] 11 (11/14 0445) BP: (154-206)/(64-88) 154/70 (11/14 0445) SpO2:  [93 %-98 %] 98 % (11/14 0445) Last BM Date: 06/16/20  Intake/Output from previous day: 11/13 0701 - 11/14 0700 In: 500 [I.V.:400; IV Piggyback:100] Out: 375 [Urine:300; Blood:75] Intake/Output this shift: No intake/output data recorded.  PE: General: pleasant, WD, WN male who is laying in bed in NAD HEENT: sclera anicteric Heart: regular, rate, and rhythm.  Normal s1,s2. No obvious murmurs, gallops, or rubs noted.  Palpable radial and pedal pulses bilaterally Lungs: CTAB, no wheezes, rhonchi, or rales noted.  Respiratory effort nonlabored Abd: soft, mildly ttp in RUQ, ND, +BS, incisions c/d/i   Lab Results:  Recent Labs    06/16/20 0437 06/17/20 0049  WBC 10.7* 14.1*  HGB 11.2* 11.4*  HCT 32.8* 33.9*  PLT 247 252   BMET Recent Labs    06/16/20 0437 06/17/20 0049  NA 137 136  K 4.0 4.9  CL 108 106  CO2 20* 22  GLUCOSE 126* 221*  BUN 26* 22  CREATININE 1.56* 1.40*  CALCIUM 8.5* 8.3*   PT/INR No results for input(s): LABPROT, INR in the last 72 hours. CMP     Component Value Date/Time   NA 136 06/17/2020 0049   NA 142 09/30/2018 0000   K 4.9 06/17/2020 0049   CL 106 06/17/2020 0049   CO2 22 06/17/2020 0049   GLUCOSE 221 (H) 06/17/2020 0049   BUN 22 06/17/2020 0049   BUN 22 (A) 09/30/2018 0000   CREATININE 1.40 (H) 06/17/2020 0049   CREATININE 2.10 (H) 06/05/2020 1101   CALCIUM 8.3 (L) 06/17/2020 0049   PROT 5.8 (L) 06/17/2020 0049   PROT 6.7 01/28/2018 0957     ALBUMIN 2.5 (L) 06/17/2020 0049   ALBUMIN 4.3 01/28/2018 0957   AST 164 (H) 06/17/2020 0049   ALT 138 (H) 06/17/2020 0049   ALKPHOS 75 06/17/2020 0049   BILITOT 0.9 06/17/2020 0049   BILITOT 0.4 01/28/2018 0957   GFRNONAA 53 (L) 06/17/2020 0049   GFRNONAA 31 (L) 06/05/2020 1101   GFRAA 35 (L) 06/05/2020 1101   Lipase     Component Value Date/Time   LIPASE 29 06/17/2020 0049       Studies/Results: No results found.  Anti-infectives: Anti-infectives (From admission, onward)   Start     Dose/Rate Route Frequency Ordered Stop   06/15/20 0800  ceFAZolin (ANCEF) IVPB 2g/100 mL premix        2 g 200 mL/hr over 30 Minutes Intravenous Every 12 hours 06/14/20 1314     06/08/20 0800  cefTRIAXone (ROCEPHIN) 2 g in sodium chloride 0.9 % 100 mL IVPB  Status:  Discontinued        2 g 200 mL/hr over 30 Minutes Intravenous Every 24 hours 06/07/20 1102 06/14/20 1314   06/07/20 1430  doravirine (PIFELTRO) tablet 100 mg       Note to Pharmacy: OP HUD:JSHF 1 TABLET(100 MG) BY MOUTH DAILY with Tivicay Patient taking differently: Take 100 mg by mouth in the morning (  with Tivicay)     100 mg Oral Every morning 06/07/20 1102     06/07/20 1345  dolutegravir (TIVICAY) tablet 50 mg        50 mg Oral Every morning 06/07/20 1102     06/07/20 1200  metroNIDAZOLE (FLAGYL) IVPB 500 mg  Status:  Discontinued        500 mg 100 mL/hr over 60 Minutes Intravenous Every 8 hours 06/07/20 1102 06/07/20 2318   06/07/20 1115  valACYclovir (VALTREX) tablet 500 mg        500 mg Oral Every morning 06/07/20 1102     06/07/20 0715  piperacillin-tazobactam (ZOSYN) IVPB 3.375 g        3.375 g 100 mL/hr over 30 Minutes Intravenous  Once 06/07/20 0703 06/07/20 0831   06/07/20 0630  cefTRIAXone (ROCEPHIN) 1 g in sodium chloride 0.9 % 100 mL IVPB        1 g 200 mL/hr over 30 Minutes Intravenous  Once 06/07/20 0618 06/07/20 0705       Assessment/Plan HTN HLD HIV DM2 CHF (EF 40-45% on 05/28/20) CAD (hx of  CABG, mult PCI with most recent angioplasty on 05/28/20 for in-stent restenosis) -Brilintaon hold. Cleared by cardiology  E. Coli Bacteremia  Choledocholithiasis  S/p lap chole 11/13 Dr. Barry Dienes  - s/p ERCP - POD#1 - LFTs mildly up, likely from cautery and not unexpected - some nausea but improving since post-op - passing flatus  - stable for discharge later today from a surgical perspective as long as he tolerates lunch. Ok to resume Oncologist - abx for bacteremia per primary service  FEN -CLD advance to CM diet  VTE -SCDs ID -Ancef for E. Coli Bacteremia  LOS: 10 days    Norm Parcel , [facility] 06/17/2020, 11:12 AM Please see Amion for pager number during day hours 7:00am-4:30pm

## 2020-06-17 NOTE — Progress Notes (Signed)
PT Cancellation Note  Patient Details Name: [name] MRN: [id number] DOB: 29-May-1946   Cancelled Treatment:    Reason Eval/Treat Not Completed: Pain limiting ability to participate. Attempted PT eval. Pt in bed with eyes closed. Reports current abdominal pain is too severe to try walking. PT to re-attempt as time allows.   Lorriane Shire 06/17/2020, 12:48 PM   Lorrin Goodell, PT  Office # [phone] Pager [phone]

## 2020-06-17 NOTE — Discharge Instructions (Signed)
[facility] LAPAROSCOPIC SURGERY: POST OP INSTRUCTIONS Always review your discharge instruction sheet given to you by the facility where your surgery was performed. IF YOU HAVE DISABILITY OR FAMILY LEAVE FORMS, YOU MUST BRING THEM TO THE OFFICE FOR PROCESSING.   DO NOT GIVE THEM TO YOUR DOCTOR.  PAIN CONTROL  1. First take acetaminophen (Tylenol) AND/or ibuprofen (Advil) to control your pain after surgery.  Follow directions on package.  Taking acetaminophen (Tylenol) and/or ibuprofen (Advil) regularly after surgery will help to control your pain and lower the amount of prescription pain medication you may need.  You should not take more than 3,000 mg (3 grams) of acetaminophen (Tylenol) in 24 hours.  You should not take ibuprofen (Advil), aleve, motrin, naprosyn or other NSAIDS if you have a history of stomach ulcers or chronic kidney disease.  2. A prescription for pain medication may be given to you upon discharge.  Take your pain medication as prescribed, if you still have uncontrolled pain after taking acetaminophen (Tylenol) or ibuprofen (Advil). 3. Use ice packs to help control pain. 4. If you need a refill on your pain medication, please contact your pharmacy.  They will contact our office to request authorization. Prescriptions will not be filled after 5pm or on week-ends.  HOME MEDICATIONS 5. Take your usually prescribed medications unless otherwise directed.  DIET 6. You should follow a light diet the first few days after arrival home.  Be sure to include lots of fluids daily. Avoid fatty, fried foods.   CONSTIPATION 7. It is common to experience some constipation after surgery and if you are taking pain medication.  Increasing fluid intake and taking a stool softener (such as Colace) will usually help or prevent this problem from occurring.  A mild laxative (Milk of Magnesia or Miralax) should be taken according to package instructions if there are no bowel  movements after 48 hours.  WOUND/INCISION CARE 8. Most patients will experience some swelling and bruising in the area of the incisions.  Ice packs will help.  Swelling and bruising can take several days to resolve.  9. Unless discharge instructions indicate otherwise, follow guidelines below  a. STERI-STRIPS - you may remove your outer bandages 48 hours after surgery, and you may shower at that time.  You have steri-strips (small skin tapes) in place directly over the incision.  These strips should be left on the skin for 7-10 days.   b. DERMABOND/SKIN GLUE - you may shower in 24 hours.  The glue will flake off over the next 2-3 weeks. 10. Any sutures or staples will be removed at the office during your follow-up visit.  ACTIVITIES 11. You may resume regular (light) daily activities beginning the next day--such as daily self-care, walking, climbing stairs--gradually increasing activities as tolerated.  You may have sexual intercourse when it is comfortable.  Refrain from any heavy lifting or straining until approved by your doctor. a. You may drive when you are no longer taking prescription pain medication, you can comfortably wear a seatbelt, and you can safely maneuver your car and apply brakes.  FOLLOW-UP 12. You should see your doctor in the office for a follow-up appointment approximately 2-3 weeks after your surgery.  You should have been given your post-op/follow-up appointment when your surgery was scheduled.  If you did not receive a post-op/follow-up appointment, make sure that you call for this appointment within a day or two after you arrive home to insure a convenient appointment time.     WHEN TO CALL YOUR DOCTOR: 1. Fever over 101.0 2. Inability to urinate 3. Continued bleeding from incision. 4. Increased pain, redness, or drainage from the incision. 5. Increasing abdominal pain  The clinic staff is available to answer your questions during regular business hours.  Please don't  hesitate to call and ask to speak to one of the nurses for clinical concerns.  If you have a medical emergency, go to the nearest emergency room or call 911.  A surgeon from [facility] is always on call at the hospital. [address], [address], [place], [place]  [address] ? [address] [address], [place], [place]   [address] [phone] ? [phone] ? FAX [phone] Web site: [email or web address]  .........   Managing Your Pain After Surgery Without Opioids    Thank you for participating in our program to help patients manage their pain after surgery without opioids. This is part of our effort to provide you with the best care possible, without exposing you or your family to the risk that opioids pose.  What pain can I expect after surgery? You can expect to have some pain after surgery. This is normal. The pain is typically worse the day after surgery, and quickly begins to get better. Many studies have found that many patients are able to manage their pain after surgery with Over-the-Counter (OTC) medications such as Tylenol and Motrin. If you have a condition that does not allow you to take Tylenol or Motrin, notify your surgical team.  How will I manage my pain? The best strategy for controlling your pain after surgery is around the clock pain control with Tylenol (acetaminophen) and Motrin (ibuprofen or Advil). Alternating these medications with each other allows you to maximize your pain control. In addition to Tylenol and Motrin, you can use heating pads or ice packs on your incisions to help reduce your pain.  How will I alternate your regular strength over-the-counter pain medication? You will take a dose of pain medication every three hours. ; Start by taking 650 mg of Tylenol (2 pills of 325 mg) ; 3 hours later take 600 mg of Motrin (3 pills of 200 mg) ; 3 hours after taking the Motrin take 650 mg of Tylenol ; 3 hours after that take 600 mg of  Motrin.   - 1 -  See example - if your first dose of Tylenol is at 12:00 PM   12:00 PM Tylenol 650 mg (2 pills of 325 mg)  3:00 PM Motrin 600 mg (3 pills of 200 mg)  6:00 PM Tylenol 650 mg (2 pills of 325 mg)  9:00 PM Motrin 600 mg (3 pills of 200 mg)  Continue alternating every 3 hours   We recommend that you follow this schedule around-the-clock for at least 3 days after surgery, or until you feel that it is no longer needed. Use the table on the last page of this handout to keep track of the medications you are taking. Important: Do not take more than 3000mg of Tylenol or 3200mg of Motrin in a 24-hour period. Do not take ibuprofen/Motrin if you have a history of bleeding stomach ulcers, severe kidney disease, &/or actively taking a blood thinner  What if I still have pain? If you have pain that is not controlled with the over-the-counter pain medications (Tylenol and Motrin or Advil) you might have what we call "breakthrough" pain. You will receive a prescription for a small amount of an opioid pain medication such as   Oxycodone, Tramadol, or Tylenol with Codeine. Use these opioid pills in the first 24 hours after surgery if you have breakthrough pain. Do not take more than 1 pill every 4-6 hours.  If you still have uncontrolled pain after using all opioid pills, don't hesitate to call our staff using the number provided. We will help make sure you are managing your pain in the best way possible, and if necessary, we can provide a prescription for additional pain medication.   Day 1    Time  Name of Medication Number of pills taken  Amount of Acetaminophen  Pain Level   Comments  AM PM       AM PM       AM PM       AM PM       AM PM       AM PM       AM PM       AM PM       Total Daily amount of Acetaminophen Do not take more than  3,000 mg per day      Day 2    Time  Name of Medication Number of pills taken  Amount of Acetaminophen  Pain Level   Comments  AM  PM       AM PM       AM PM       AM PM       AM PM       AM PM       AM PM       AM PM       Total Daily amount of Acetaminophen Do not take more than  3,000 mg per day      Day 3    Time  Name of Medication Number of pills taken  Amount of Acetaminophen  Pain Level   Comments  AM PM       AM PM       AM PM       AM PM          AM PM       AM PM       AM PM       AM PM       Total Daily amount of Acetaminophen Do not take more than  3,000 mg per day      Day 4    Time  Name of Medication Number of pills taken  Amount of Acetaminophen  Pain Level   Comments  AM PM       AM PM       AM PM       AM PM       AM PM       AM PM       AM PM       AM PM       Total Daily amount of Acetaminophen Do not take more than  3,000 mg per day      Day 5    Time  Name of Medication Number of pills taken  Amount of Acetaminophen  Pain Level   Comments  AM PM       AM PM       AM PM       AM PM       AM PM       AM PM       AM PM         AM PM       Total Daily amount of Acetaminophen Do not take more than  3,000 mg per day       Day 6    Time  Name of Medication Number of pills taken  Amount of Acetaminophen  Pain Level  Comments  AM PM       AM PM       AM PM       AM PM       AM PM       AM PM       AM PM       AM PM       Total Daily amount of Acetaminophen Do not take more than  3,000 mg per day      Day 7    Time  Name of Medication Number of pills taken  Amount of Acetaminophen  Pain Level   Comments  AM PM       AM PM       AM PM       AM PM       AM PM       AM PM       AM PM       AM PM       Total Daily amount of Acetaminophen Do not take more than  3,000 mg per day        For additional information about how and where to safely dispose of unused opioid medications - https://www.morepowerful[employer].org  Disclaimer: This document contains information and/or instructional materials adapted from [facility]  for the typical patient with your condition. It does not replace medical advice from your health care provider because your experience may differ from that of the typical patient. Talk to your health care provider if you have any questions about this document, your condition or your treatment plan. Adapted from [facility]  

## 2020-06-17 NOTE — Progress Notes (Signed)
PROGRESS NOTE    [name]   ZOX:[id number]  DOB: June 11, 1946  DOA: 06/07/2020     10  PCP: Dorothyann Peng, NP  CC: abdominal pain  Hospital Course: [name] is a 74 year old male with history of coronary disease status post recent left heart cath (05/28/20) with successful PTCA to distal SVG to diagonal in-stent restenosis (on asa/brilinta PTA), diabetes type 2, CKD3b, hypertension, HIV, chronic diastolic congestive heart failure, BPH who presented to the emergency department with complaints of abdominal pain mainly in the epigastric region and right upper quadrant.   In the emergency department, he also had fever and was tachycardic.  Patient was found to have elevated liver enzymes on presentation, elevated bilirubin, elevated lactate, elevated troponin.   CT abdomen/pelvis showed cholelithiasis, multiple stones in the distal CBD resulting in mild CBD dilation. Patient was admitted for the management of sepsis secondary to cholangitis.  GI, general surgery, cardiology following.    He was also positive for E. coli bacteremia which was presumed translocation from GI source.  He was treated with Rocephin during hospitalization.  Unsuccessful ERCP on 06/11/20 but stent placed in pancreatic duct.   Repeat ERCP on 11/12 was successful and stones were removed. He then underwent a lap CCY on 06/16/20.   Interval History:  Underwent cholecystectomy on 11/13. This afternoon he is very weak and tired in bed. Nauseous but denies any vomiting.  Had a very small bowel movement overnight and is having some flatus.  He has not taken in much of his liquid diet due to not feeling hungry and because of his nausea.  Abdomen hurts which is not unexpected from surgery however. He was okay remaining in the hospital until tomorrow as he felt too weak for going home today.  Old records reviewed in assessment of this patient  ROS: Constitutional: negative for chills and fevers, Respiratory: negative for  cough, Cardiovascular: negative for chest pain and Gastrointestinal: negative for abdominal pain, constipation and diarrhea  Assessment & Plan: * Severe sepsis (HCC)-resolved as of 06/14/2020 Fever, tachycardia, lactic acidosis, AKI, leukocytosis.  Work-up consistent with choledocholithiasis and cholangitis.  Also developed E. coli bacteremia from presumed translocation of GI source - Rocephin further narrowed to Ancef; continue to complete 2 week course total (can transition to PO after surgery and he remains stable) -If fever, will need repeat blood cultures  Choledocholithiasis -Patient presented with epigastric pain.  Technical difficulty with ERCP.  MRCP was repeated on 06/12/2020 which reveals cholelithiasis, choledocholithiasis with multiple calculi seen within dilated CBD - repeat ERCP done on 11/12; stones successfully removed - now s/p lap CCY on 11/13 - patient weak and run down this am; no fever; mild not unexpected rise in LFTs per surgery - having flatus; very tiny BM overnight; some nausea but no vomiting but minimal liquid intake currently; we discussed remaining in the hospital today and possible d/c home tomorrow if feeling better and taking in more diet; he was okay with this   Chronic diastolic CHF (congestive heart failure) (HCC) - lasix on hold.   - initially on gentle IV fluids due to concern for sepsis, dehydration and AKI.  Continue current medications.Echo done on this admission showed ejection fraction of 60 to 65%, no wall motion abnormality - IVF now off  Coronary artery disease - s/p PTCA on 10/25 for in-stent restenosis. Was discharged on [place] on hold for procedures/surgery; okay to resume on 11/15 per surgery - continue asa, lopressor - statin on hold  for now   Type 2 diabetes mellitus with stage 3b chronic kidney disease, without long-term current use of insulin (HCC) - continue SSI and CBG monitoring   Acute renal failure superimposed  on stage 3b chronic kidney disease ([place]) Presented with creatinine in the range of 2.  Baseline creatinine ranges from 1.5-1.8.  Kidney function improved with IV fluids.  HIV disease ([place]) - continue home meds   Antimicrobials: Rocephin 06/07/2020>> 06/15/2020 Ancef 06/15/2020>> present  DVT prophylaxis: SCD Code Status: Full Family Communication: None present Disposition Plan: Status is: Inpatient  Remains inpatient appropriate because:Ongoing diagnostic testing needed not appropriate for outpatient work up, Unsafe d/c plan, IV treatments appropriate due to intensity of illness or inability to take PO and Inpatient level of care appropriate due to severity of illness   Dispo: The patient is from: Home              Anticipated d/c is to: Home              Anticipated d/c date is: > 3 days              Patient currently is not medically stable to d/c.  Objective: Blood pressure 136/64, pulse 66, temperature 98.7 F (37.1 C), resp. rate 19, height 5\' 3"  (1.6 m), weight 61.2 kg, SpO2 95 %.  Examination: General appearance: alert, cooperative and no distress Head: Normocephalic, without obvious abnormality, atraumatic Eyes: EOMI Lungs: clear to auscultation bilaterally Heart: regular rate and rhythm and S1, S2 normal Abdomen: Soft, tender as expected nonspecifically.  Bowel sounds present Extremities: No edema Skin: mobility and turgor normal Neurologic: Grossly normal  Consultants:   GI  Surgery  Cardiology  Procedures:   06/11/2020: ERCP  06/15/2020: ERCP, 2nd  06/16/2020: Laparoscopic cholecystectomy  Data Reviewed: I have personally reviewed following labs and imaging studies Results for orders placed or performed during the hospital encounter of 06/07/20 (from the past 24 hour(s))  Glucose, capillary     Status: Abnormal   Collection Time: 06/16/20  4:26 PM  Result Value Ref Range   Glucose-Capillary 177 (H) 70 - 99 mg/dL  Glucose, capillary     Status:  Abnormal   Collection Time: 06/16/20  9:17 PM  Result Value Ref Range   Glucose-Capillary 182 (H) 70 - 99 mg/dL  CBC with Differential/Platelet     Status: Abnormal   Collection Time: 06/17/20 12:49 AM  Result Value Ref Range   WBC 14.1 (H) 4.0 - 10.5 K/uL   RBC 3.58 (L) 4.22 - 5.81 MIL/uL   Hemoglobin 11.4 (L) 13.0 - 17.0 g/dL   HCT 33.9 (L) 39 - 52 %   MCV 94.7 80.0 - 100.0 fL   MCH 31.8 26.0 - 34.0 pg   MCHC 33.6 30.0 - 36.0 g/dL   RDW 13.7 11.5 - 15.5 %   Platelets 252 150 - 400 K/uL   nRBC 0.0 0.0 - 0.2 %   Neutrophils Relative % 91 %   Neutro Abs 12.8 (H) 1.7 - 7.7 K/uL   Lymphocytes Relative 3 %   Lymphs Abs 0.4 (L) 0.7 - 4.0 K/uL   Monocytes Relative 6 %   Monocytes Absolute 0.8 0.1 - 1.0 K/uL   Eosinophils Relative 0 %   Eosinophils Absolute 0.0 0.0 - 0.5 K/uL   Basophils Relative 0 %   Basophils Absolute 0.0 0.0 - 0.1 K/uL   Immature Granulocytes 0 %   Abs Immature Granulocytes 0.06 0.00 - 0.07 K/uL  Comprehensive metabolic panel  Status: Abnormal   Collection Time: 06/17/20 12:49 AM  Result Value Ref Range   Sodium 136 135 - 145 mmol/L   Potassium 4.9 3.5 - 5.1 mmol/L   Chloride 106 98 - 111 mmol/L   CO2 22 22 - 32 mmol/L   Glucose, Bld 221 (H) 70 - 99 mg/dL   BUN 22 8 - 23 mg/dL   Creatinine, Ser 1.40 (H) 0.61 - 1.24 mg/dL   Calcium 8.3 (L) 8.9 - 10.3 mg/dL   Total Protein 5.8 (L) 6.5 - 8.1 g/dL   Albumin 2.5 (L) 3.5 - 5.0 g/dL   AST 164 (H) 15 - 41 U/L   ALT 138 (H) 0 - 44 U/L   Alkaline Phosphatase 75 38 - 126 U/L   Total Bilirubin 0.9 0.3 - 1.2 mg/dL   GFR, Estimated 53 (L) >60 mL/min   Anion gap 8 5 - 15  Magnesium     Status: None   Collection Time: 06/17/20 12:49 AM  Result Value Ref Range   Magnesium 2.1 1.7 - 2.4 mg/dL  Lipase, blood     Status: None   Collection Time: 06/17/20 12:49 AM  Result Value Ref Range   Lipase 29 11 - 51 U/L  Glucose, capillary     Status: Abnormal   Collection Time: 06/17/20  7:42 AM  Result Value Ref Range    Glucose-Capillary 136 (H) 70 - 99 mg/dL  Glucose, capillary     Status: Abnormal   Collection Time: 06/17/20 12:25 PM  Result Value Ref Range   Glucose-Capillary 145 (H) 70 - 99 mg/dL   *Note: Due to a large number of results and/or encounters for the requested time period, some results have not been displayed. A complete set of results can be found in Results Review.    Recent Results (from the past 240 hour(s))  Gastrointestinal Panel by PCR , Stool     Status: None   Collection Time: 06/08/20 10:03 AM   Specimen: Stool  Result Value Ref Range Status   Campylobacter species NOT DETECTED NOT DETECTED Final   Plesimonas shigelloides NOT DETECTED NOT DETECTED Final   Salmonella species NOT DETECTED NOT DETECTED Final   Yersinia enterocolitica NOT DETECTED NOT DETECTED Final   Vibrio species NOT DETECTED NOT DETECTED Final   Vibrio cholerae NOT DETECTED NOT DETECTED Final   Enteroaggregative E coli (EAEC) NOT DETECTED NOT DETECTED Final   Enteropathogenic E coli (EPEC) NOT DETECTED NOT DETECTED Final   Enterotoxigenic E coli (ETEC) NOT DETECTED NOT DETECTED Final   Shiga like toxin producing E coli (STEC) NOT DETECTED NOT DETECTED Final   Shigella/Enteroinvasive E coli (EIEC) NOT DETECTED NOT DETECTED Final   Cryptosporidium NOT DETECTED NOT DETECTED Final   Cyclospora cayetanensis NOT DETECTED NOT DETECTED Final   Entamoeba histolytica NOT DETECTED NOT DETECTED Final   Giardia lamblia NOT DETECTED NOT DETECTED Final   Adenovirus F40/41 NOT DETECTED NOT DETECTED Final   Astrovirus NOT DETECTED NOT DETECTED Final   Norovirus GI/GII NOT DETECTED NOT DETECTED Final   Rotavirus A NOT DETECTED NOT DETECTED Final   Sapovirus (I, II, IV, and V) NOT DETECTED NOT DETECTED Final    Comment: Performed at [facility], [address], [place], [place] [address]     Radiology Studies: No results found. DG ERCP BILIARY & PANCREATIC DUCTS  Final Result    MR ABDOMEN MRCP W WO  CONTAST  Final Result    DG Abd 1 View - KUB  Final  Result    DG C-Arm 1-60 Min  Final Result    CT ABDOMEN PELVIS WO CONTRAST  Final Result    US Abdomen Limited RUQ (LIVER/GB)  Final Result    DG Chest 2 View  Final Result      Scheduled Meds: . acidophilus  1 capsule Oral Q breakfast  . aspirin EC  81 mg Oral Daily  . B-complex with vitamin C  1 tablet Oral Q breakfast  . cholecalciferol  2,000 Units Oral q AM  . dolutegravir  50 mg Oral q AM  . doravirine  100 mg Oral q AM  . insulin aspart  0-15 Units Subcutaneous TID WC  . isosorbide mononitrate  30 mg Oral Daily  . metoprolol tartrate  12.5 mg Oral BID  . pantoprazole  40 mg Oral QAC breakfast  . potassium chloride SA  20 mEq Oral Daily  . sodium bicarbonate  650 mg Oral TID  . tamsulosin  0.4 mg Oral Daily  . [START ON 06/18/2020] ticagrelor  90 mg Oral BID  . valACYclovir  500 mg Oral q AM   PRN Meds: labetalol, loperamide, morphine injection, nitroGLYCERIN, ondansetron **OR** ondansetron (ZOFRAN) IV, oxyCODONE Continuous Infusions: .  ceFAZolin (ANCEF) IV 2 g (06/17/20 0827)     LOS: 10 days  Time spent: Greater than 50% of the 35 minute visit was spent in counseling/coordination of care for the patient as laid out in the A&P.   Dwyane Dee, MD [facility] 06/17/2020, 1:44 PM

## 2020-06-18 ENCOUNTER — Encounter: Payer: Self-pay | Admitting: Gastroenterology

## 2020-06-18 ENCOUNTER — Encounter (HOSPITAL_COMMUNITY): Payer: Self-pay | Admitting: Gastroenterology

## 2020-06-18 LAB — CBC WITH DIFFERENTIAL/PLATELET
Abs Immature Granulocytes: 0.06 10*3/uL (ref 0.00–0.07)
Basophils Absolute: 0 10*3/uL (ref 0.0–0.1)
Basophils Relative: 0 %
Eosinophils Absolute: 0.1 10*3/uL (ref 0.0–0.5)
Eosinophils Relative: 1 %
HCT: 33.1 % — ABNORMAL LOW (ref 39.0–52.0)
Hemoglobin: 10.9 g/dL — ABNORMAL LOW (ref 13.0–17.0)
Immature Granulocytes: 1 %
Lymphocytes Relative: 5 %
Lymphs Abs: 0.6 10*3/uL — ABNORMAL LOW (ref 0.7–4.0)
MCH: 31.1 pg (ref 26.0–34.0)
MCHC: 32.9 g/dL (ref 30.0–36.0)
MCV: 94.3 fL (ref 80.0–100.0)
Monocytes Absolute: 0.6 10*3/uL (ref 0.1–1.0)
Monocytes Relative: 6 %
Neutro Abs: 9.6 10*3/uL — ABNORMAL HIGH (ref 1.7–7.7)
Neutrophils Relative %: 87 %
Platelets: 184 10*3/uL (ref 150–400)
RBC: 3.51 MIL/uL — ABNORMAL LOW (ref 4.22–5.81)
RDW: 13.8 % (ref 11.5–15.5)
WBC: 10.9 10*3/uL — ABNORMAL HIGH (ref 4.0–10.5)
nRBC: 0 % (ref 0.0–0.2)

## 2020-06-18 LAB — COMPREHENSIVE METABOLIC PANEL
ALT: 50 U/L — ABNORMAL HIGH (ref 0–44)
AST: 81 U/L — ABNORMAL HIGH (ref 15–41)
Albumin: 2.4 g/dL — ABNORMAL LOW (ref 3.5–5.0)
Alkaline Phosphatase: 75 U/L (ref 38–126)
Anion gap: 8 (ref 5–15)
BUN: 14 mg/dL (ref 8–23)
CO2: 23 mmol/L (ref 22–32)
Calcium: 8.4 mg/dL — ABNORMAL LOW (ref 8.9–10.3)
Chloride: 107 mmol/L (ref 98–111)
Creatinine, Ser: 1.39 mg/dL — ABNORMAL HIGH (ref 0.61–1.24)
GFR, Estimated: 54 mL/min — ABNORMAL LOW (ref >60)
Glucose, Bld: 117 mg/dL — ABNORMAL HIGH (ref 70–99)
Potassium: 4.1 mmol/L (ref 3.5–5.1)
Sodium: 138 mmol/L (ref 135–145)
Total Bilirubin: 1.9 mg/dL — ABNORMAL HIGH (ref 0.3–1.2)
Total Protein: 5.8 g/dL — ABNORMAL LOW (ref 6.5–8.1)

## 2020-06-18 LAB — MAGNESIUM: Magnesium: 1.9 mg/dL (ref 1.7–2.4)

## 2020-06-18 LAB — GLUCOSE, CAPILLARY: Glucose-Capillary: 103 mg/dL — ABNORMAL HIGH (ref 70–99)

## 2020-06-18 LAB — SURGICAL PATHOLOGY

## 2020-06-18 MED ORDER — CEPHALEXIN 500 MG PO CAPS: 500.0000 mg | ORAL_CAPSULE | Freq: Two times a day (BID) | ORAL | 0 refills | Status: AC

## 2020-06-18 NOTE — Discharge Summary (Signed)
Physician Discharge Summary   [name] NFA:[id number] DOB: 1945/12/09 DOA: 06/07/2020  PCP: Dorothyann Peng, NP  Admit date: 06/07/2020 Discharge date: 06/18/2020  Admitted From: home Disposition:  home Discharging physician: Dwyane Dee, MD  Recommendations for Outpatient Follow-up:  1. Follow up with cardiology as needed   Patient discharged to home in Discharge Condition: stable CODE STATUS: Full Diet recommendation:  Diet Orders (From admission, onward)    Start     Ordered   06/18/20 0000  Diet - low sodium heart healthy        06/18/20 1005   06/18/20 0000  Diet Carb Modified        06/18/20 1005   06/17/20 1119  Diet Carb Modified Fluid consistency: Thin; Room service appropriate? Yes  Diet effective now       Question Answer Comment  Diet-HS Snack? Nothing   Calorie Level Medium 1600-2000   Fluid consistency: Thin   Room service appropriate? Yes      06/17/20 1118          Hospital Course: Mr. Gatson is a 74 year old male with history of coronary disease status post recent left heart cath (05/28/20) with successful PTCA to distal SVG to diagonal in-stent restenosis (on asa/brilinta PTA), diabetes type 2, CKD3b, hypertension, HIV, chronic diastolic congestive heart failure, BPH who presented to the emergency department with complaints of abdominal pain mainly in the epigastric region and right upper quadrant.   In the emergency department, he also had fever and was tachycardic.  Patient was found to have elevated liver enzymes on presentation, elevated bilirubin, elevated lactate, elevated troponin.   CT abdomen/pelvis showed cholelithiasis, multiple stones in the distal CBD resulting in mild CBD dilation. Patient was admitted for the management of sepsis secondary to cholangitis.  GI, general surgery, cardiology following.    He was also positive for E. coli bacteremia which was presumed translocation from GI source.  He was treated with Rocephin during  hospitalization.  Unsuccessful ERCP on 06/11/20 but stent placed in pancreatic duct.   Repeat ERCP on 11/12 was successful and stones were removed. He then underwent a lap CCY on 06/16/20.  He recovered well after surgery.  Diet was slowly advanced and he tolerated well.  He was discharged with a course of Keflex to complete for his bacteremia at time of discharge.   * Severe sepsis (HCC)-resolved as of 06/14/2020 Fever, tachycardia, lactic acidosis, AKI, leukocytosis.  Work-up consistent with choledocholithiasis and cholangitis.  Also developed E. coli bacteremia from presumed translocation of GI source - Rocephin further narrowed to Ancef; continue to complete 2 week course total (can transition to PO after surgery and he remains stable) -If fever, will need repeat blood cultures -Descalated to Keflex at discharge to complete course  Choledocholithiasis -Patient presented with epigastric pain.  Technical difficulty with ERCP.  MRCP was repeated on 06/12/2020 which reveals cholelithiasis, choledocholithiasis with multiple calculi seen within dilated CBD - repeat ERCP done on 11/12; stones successfully removed - now s/p lap CCY on 11/13 - patient weak and run down this am; no fever; mild not unexpected rise in LFTs per surgery -LFTs improved on day of discharge.  Also tolerating diet better with no nausea or vomiting  Chronic diastolic CHF (congestive heart failure) (HCC) - lasix on hold.   - initially on gentle IV fluids due to concern for sepsis, dehydration and AKI.  Continue current medications.Echo done on this admission showed ejection fraction of 60 to 65%, no wall motion abnormality -  IVF now off  Coronary artery disease - s/p PTCA on 10/25 for in-stent restenosis. Was discharged on asa/brilinta - brilinta on hold for procedures/surgery; okay to resume on 11/15 per surgery - continue asa, lopressor - statin on hold for now   Type 2 diabetes mellitus with stage 3b chronic kidney  disease, without long-term current use of insulin ([place]) - continue SSI and CBG monitoring   Acute renal failure superimposed on stage 3b chronic kidney disease ([place]) Presented with creatinine in the range of 2.  Baseline creatinine ranges from 1.5-1.8.  Kidney function improved with IV fluids.  HIV disease ([place]) - continue home meds    The patient's chronic medical conditions were treated accordingly per the patient's home medication regimen except as noted.  On day of discharge, patient was felt deemed stable for discharge. Patient/family member advised to call PCP or come back to ER if needed.   Principal Diagnosis: Severe sepsis [facility])  Discharge Diagnoses: Active Hospital Problems   Diagnosis Date Noted  . Choledocholithiasis     Priority: Medium  . Chronic diastolic CHF (congestive heart failure) ([place]) 05/27/2020    Priority: Low  . Coronary artery disease 05/12/2006    Priority: Low  . Transaminitis   . Calculus of bile duct with acute cholangitis 06/07/2020  . Pre-op evaluation   . Benign prostatic hyperplasia without lower urinary tract symptoms 05/27/2020  . Acute renal failure superimposed on stage 3b chronic kidney disease ([place]) 12/02/2016  . HIV disease ([place]) 03/23/2014  . Chronic obstructive pulmonary disease ([place]) 07/20/2008  . Depression, recurrent ([place]) 09/03/2006  . Type 2 diabetes mellitus with stage 3b chronic kidney disease, without long-term current use of insulin ([place]) 09/03/2006  . Human immunodeficiency virus (HIV) disease ([place]) 05/12/2006    Resolved Hospital Problems   Diagnosis Date Noted Date Resolved  . Severe sepsis [facility]) 06/07/2020 06/14/2020    Priority: High    Discharge Instructions    Diet - low sodium heart healthy   Complete by: As directed    Diet Carb Modified   Complete by: As directed    Increase activity slowly   Complete by: As directed    No wound care   Complete by: As directed      Allergies as of 06/18/2020   No Known  Allergies     Medication List    TAKE these medications   acetaminophen 325 MG tablet Commonly known as: TYLENOL Take 2 tablets (650 mg total) by mouth every 4 (four) hours as needed for headache or mild pain.   aspirin 81 MG chewable tablet Chew 81 mg by mouth at bedtime.   atorvastatin 40 MG tablet Commonly known as: LIPITOR Take 1 tablet (40 mg total) by mouth every evening.   b complex vitamins tablet Take 1 tablet by mouth daily. What changed: when to take this   cephALEXin 500 MG capsule Commonly known as: KEFLEX Take 1 capsule (500 mg total) by mouth 2 (two) times daily for 10 days.   doravirine 100 MG Tabs tablet Commonly known as: Pifeltro TAKE 1 TABLET(100 MG) BY MOUTH DAILY with Tivicay What changed:   how much to take  how to take this  when to take this  additional instructions   fenofibrate 54 MG tablet TAKE 1 TABLET(54 MG) BY MOUTH DAILY What changed: See the new instructions.   freestyle lancets Use as directed three times a day to check blood sugar.  DX E11.9   FREESTYLE TEST STRIPS test strip  Generic drug: glucose blood USE TO CHECK BLOOD SUGAR THREE TIMES DAILY   furosemide 20 MG tablet Commonly known as: LASIX Take 1 tablet (20 mg total) by mouth daily.   icosapent Ethyl 1 g capsule Commonly known as: Vascepa Take 2 capsules (2 g total) by mouth 2 (two) times daily. What changed: Another medication with the same name was removed. Continue taking this medication, and follow the directions you see here.   isosorbide mononitrate 30 MG 24 hr tablet Commonly known as: IMDUR Take 1 tablet (30 mg total) by mouth daily.   losartan 25 MG tablet Commonly known as: COZAAR Take 1 tablet (25 mg total) by mouth daily.   metoprolol tartrate 25 MG tablet Commonly known as: LOPRESSOR Take 0.5 tablets (12.5 mg total) by mouth 2 (two) times daily.   nitroGLYCERIN 0.4 MG SL tablet Commonly known as: NITROSTAT PLACE 1 TABLET UNDER THE TONGUE  EVERY 5 MINUTES AS NEEDED FOR CHEST PAIN What changed:   how much to take  how to take this  when to take this  reasons to take this  additional instructions   ondansetron 4 MG tablet Commonly known as: ZOFRAN Take 1 tablet (4 mg total) by mouth every 6 (six) hours as needed for nausea.   oxyCODONE 5 MG immediate release tablet Commonly known as: Oxy IR/ROXICODONE Take 1 tablet (5 mg total) by mouth every 6 (six) hours as needed for moderate pain, severe pain or breakthrough pain.   pantoprazole 40 MG tablet Commonly known as: PROTONIX TAKE 1 TABLET(40 MG) BY MOUTH DAILY What changed:   how much to take  how to take this  when to take this  additional instructions   potassium chloride SA 20 MEQ tablet Commonly known as: KLOR-CON TAKE 1 TABLET(20 MEQ) daily What changed:   how much to take  how to take this  when to take this   Probiotic Daily Caps Take 1 by mouth daily What changed:   how much to take  how to take this  when to take this  additional instructions   repaglinide 2 MG tablet Commonly known as: PRANDIN Take 2-4 mg by mouth See admin instructions. Take 4 mg by mouth in the morning before breakfast and 2 mg before lunch and dinner/evening meal   sodium bicarbonate 650 MG tablet Take 650 mg by mouth 3 (three) times daily.   tamsulosin 0.4 MG Caps capsule Commonly known as: FLOMAX Take 0.4 mg by mouth in the morning and at bedtime.   ticagrelor 90 MG Tabs tablet Commonly known as: BRILINTA Take 1 tablet (90 mg total) by mouth 2 (two) times daily.   Tivicay 50 MG tablet Generic drug: dolutegravir TAKE 1 TABLET BY MOUTH DAILY with Pifeltro What changed:   how much to take  how to take this  when to take this  additional instructions   valACYclovir 500 MG tablet Commonly known as: VALTREX TAKE 1 TABLET BY MOUTH DAILY What changed: when to take this   Vitamin D3 50 MCG (2000 UT) Tabs Take 2,000 Units by mouth in the  morning.       Follow-up Information    [facility], [place]. Go on 07/10/2020.   Specialty: General Surgery Why: Follow up appointment scheduled for 8:30 AM. Please arrive 30 min prior to appointment time. Bring photo ID and insurance information.  Contact information: [address] [place] [address] [phone]  No Known Allergies  Consultations: Surger  Discharge Exam: BP (!) 161/76 (BP Location: Left Arm)   Pulse 79   Temp 98.9 F (37.2 C) (Oral)   Resp 16   Ht 5\' 3"  (1.6 m)   Wt 61.2 kg   SpO2 98%   BMI 23.90 kg/m  General appearance: alert, cooperative and no distress Head: Normocephalic, without obvious abnormality, atraumatic Eyes: EOMI Lungs: clear to auscultation bilaterally Heart: regular rate and rhythm and S1, S2 normal Abdomen: Soft, tender as expected nonspecifically.  Bowel sounds present Extremities: No edema Skin: mobility and turgor normal Neurologic: Grossly normal  The results of significant diagnostics from this hospitalization (including imaging, microbiology, ancillary and laboratory) are listed below for reference.   Microbiology: No results found for this or any previous visit (from the past 240 hour(s)).   Labs: BNP (last 3 results) Recent Labs    06/07/20 0436  BNP 366.4*   Basic Metabolic Panel: Recent Labs  Lab 06/14/20 0139 06/15/20 0152 06/16/20 0437 06/17/20 0049 06/18/20 0344  NA 137 139 137 136 138  K 3.9 3.9 4.0 4.9 4.1  CL 106 108 108 106 107  CO2 21* 21* 20* 22 23  GLUCOSE 213* 228* 126* 221* 117*  BUN 24* 26* 26* 22 14  CREATININE 1.61* 1.56* 1.56* 1.40* 1.39*  CALCIUM 8.3* 8.3* 8.5* 8.3* 8.4*  MG 2.0 2.0 2.0 2.1 1.9   Liver Function Tests: Recent Labs  Lab 06/14/20 0139 06/15/20 0152 06/16/20 0437 06/17/20 0049 06/18/20 0344  AST 55* 49* 69* 164* 81*  ALT 91* 87* 104* 138* 50*  ALKPHOS 75 76 68 75 75  BILITOT 1.0 0.9 1.2 0.9 1.9*  PROT 5.7* 5.8* 5.9* 5.8* 5.8*    ALBUMIN 2.5* 2.4* 2.6* 2.5* 2.4*   Recent Labs  Lab 06/17/20 0049  LIPASE 29   No results for input(s): AMMONIA in the last 168 hours. CBC: Recent Labs  Lab 06/14/20 0139 06/15/20 0152 06/16/20 0437 06/17/20 0049 06/18/20 0344  WBC 6.0 5.8 10.7* 14.1* 10.9*  NEUTROABS 4.1 4.1 9.2* 12.8* 9.6*  HGB 11.4* 10.7* 11.2* 11.4* 10.9*  HCT 34.2* 32.3* 32.8* 33.9* 33.1*  MCV 95.5 94.7 93.4 94.7 94.3  PLT 221 202 247 252 184   Cardiac Enzymes: No results for input(s): CKTOTAL, CKMB, CKMBINDEX, TROPONINI in the last 168 hours. BNP: Invalid input(s): POCBNP CBG: Recent Labs  Lab 06/17/20 0742 06/17/20 1225 06/17/20 1526 06/17/20 2034 06/18/20 0718  GLUCAP 136* 145* 122* 149* 103*   D-Dimer No results for input(s): DDIMER in the last 72 hours. Hgb A1c No results for input(s): HGBA1C in the last 72 hours. Lipid Profile No results for input(s): CHOL, HDL, LDLCALC, TRIG, CHOLHDL, LDLDIRECT in the last 72 hours. Thyroid function studies No results for input(s): TSH, T4TOTAL, T3FREE, THYROIDAB in the last 72 hours.  Invalid input(s): FREET3 Anemia work up No results for input(s): VITAMINB12, FOLATE, FERRITIN, TIBC, IRON, RETICCTPCT in the last 72 hours. Urinalysis    Component Value Date/Time   COLORURINE AMBER (A) 06/07/2020 0609   APPEARANCEUR CLEAR 06/07/2020 0609   LABSPEC 1.014 06/07/2020 0609   PHURINE 5.0 06/07/2020 0609   GLUCOSEU >=500 (A) 06/07/2020 0609   GLUCOSEU NEGATIVE 04/26/2019 0854   HGBUR SMALL (A) 06/07/2020 0609   BILIRUBINUR NEGATIVE 06/07/2020 0609   BILIRUBINUR neg 12/08/2019 1015   KETONESUR 5 (A) 06/07/2020 0609   PROTEINUR 100 (A) 06/07/2020 0609   UROBILINOGEN 0.2 12/08/2019 1015   UROBILINOGEN 0.2 04/26/2019 0854   NITRITE NEGATIVE  06/07/2020 0609   LEUKOCYTESUR NEGATIVE 06/07/2020 0609   Sepsis Labs Invalid input(s): PROCALCITONIN,  WBC,  LACTICIDVEN Microbiology No results found for this or any previous visit (from the past 240  hour(s)).  Procedures/Studies: CT ABDOMEN PELVIS WO CONTRAST  Result Date: 06/07/2020 CLINICAL DATA:  Left lower quadrant abdominal pain. EXAM: CT ABDOMEN AND PELVIS WITHOUT CONTRAST TECHNIQUE: Multidetector CT imaging of the abdomen and pelvis was performed following the standard protocol without IV contrast. COMPARISON:  March 22, 2014. FINDINGS: Lower chest: No acute abnormality. Hepatobiliary: Cholelithiasis is noted. There appears to be multiple stones in the distal common bile duct which may be resulting in mild common bile duct dilatation. No definite intrahepatic dilatation is noted. Pancreas: Unremarkable. No pancreatic ductal dilatation or surrounding inflammatory changes. Spleen: Normal in size without focal abnormality. Adrenals/Urinary Tract: Adrenal glands are unremarkable. Kidneys are normal, without renal calculi, focal lesion, or hydronephrosis. Bladder is unremarkable. Stomach/Bowel: Stomach is within normal limits. Appendix appears normal. No evidence of bowel wall thickening, distention, or inflammatory changes. Vascular/Lymphatic: No significant vascular findings are present. No enlarged abdominal or pelvic lymph nodes. Reproductive: Mild prostatic enlargement is noted. Other: No abdominal wall hernia or abnormality. No abdominopelvic ascites. Musculoskeletal: No acute or significant osseous findings. IMPRESSION: 1. Cholelithiasis is noted. There appears to be multiple stones in the distal common bile duct which may be resulting in mild common bile duct dilatation. No definite intrahepatic dilatation is noted. Correlation with liver function tests is recommended to evaluate for distal common bile duct obstruction. 2. Mild prostatic enlargement. Electronically Signed   By: Marijo Conception M.D.   On: 06/07/2020 10:03   DG Chest 2 View  Result Date: 06/07/2020 CLINICAL DATA:  Epigastric pain. EXAM: CHEST - 2 VIEW COMPARISON:  05/27/2020. FINDINGS: Prior CABG. Stable cardiomegaly. Low lung  volumes with mild bibasilar atelectasis. No pleural effusion or pneumothorax. Degenerative change thoracic spine. IMPRESSION: 1. Prior CABG. Stable cardiomegaly. 2. Low lung volumes with mild bibasilar atelectasis. Electronically Signed   By: Marcello Moores  Register   On: 06/07/2020 05:21   DG Chest 2 View  Result Date: 05/27/2020 CLINICAL DATA:  Chest pain. EXAM: CHEST - 2 VIEW COMPARISON:  Multiple priors, most recent October 20, 2017 FINDINGS: Similar cardiomediastinal silhouette. Postsurgical changes of CABG and median sternotomy. Linear and hazy left basilar opacities, similar over multiple priors and likely related to atelectasis or scar. No new focal consolidation. No pleural effusions or pneumothorax. No acute osseous abnormality. IMPRESSION: Left basilar atelectasis/scar without superimposed acute cardiopulmonary disease. Electronically Signed   By: Margaretha Sheffield MD   On: 05/27/2020 19:13   DG Lumbar Spine 2-3 Views  Result Date: 05/23/2020 CLINICAL DATA:  74 year old male with chronic low back pain. EXAM: LUMBAR SPINE - 2-3 VIEW COMPARISON:  Lumbar spine radiograph dated 02/15/2018. FINDINGS: Five lumbar type vertebra. There is no acute fracture or subluxation of the lumbar spine. The vertebral body heights and disc spaces are maintained. The visualized posterior elements are intact. The soft tissues are unremarkable. IMPRESSION: No acute/traumatic lumbar spine pathology. Electronically Signed   By: Anner Crete M.D.   On: 05/23/2020 02:59   DG Sacrum/Coccyx  Result Date: 05/23/2020 CLINICAL DATA:  74 year old male with chronic low back pain. No known injury. EXAM: SACRUM AND COCCYX - 2+ VIEW COMPARISON:  None. FINDINGS: There is no acute fracture or dislocation. Mild arthritic changes of the hips. There is mild degenerative changes of the lower lumbar spine. The soft tissues are unremarkable. IMPRESSION: Negative. Electronically Signed  By: Anner Crete M.D.   On: 05/23/2020 02:53    DG Abd 1 View - KUB  Result Date: 06/11/2020 CLINICAL DATA:  History of cholelithiasis.  Unsuccessful ERCP. EXAM: ERCP TECHNIQUE: Multiple spot images obtained with the fluoroscopic device and submitted for interpretation post-procedure. FLUOROSCOPY TIME:  23 minutes, 56 seconds COMPARISON:  CT abdomen and pelvis-06/07/2020 FINDINGS: Two spot intraoperative fluoroscopic images of the right upper abdominal quadrant during ERCP are provided for review. Initial image demonstrates an ERCP probe overlying the right upper abdominal quadrant. Subsequent image demonstrates apparent cannulation of the pancreatic duct. There is no definitive opacification of the common bile duct. IMPRESSION: ERCP as above. These images were submitted for radiologic interpretation only. Please see the procedural report for the amount of contrast and the fluoroscopy time utilized. Electronically Signed   By: Sandi Mariscal M.D.   On: 06/11/2020 12:09   CARDIAC CATHETERIZATION  Result Date: 05/28/2020 Conclusions: 1. Severe native coronary artery disease, as detailed below. 2. Widely patent LIMA to diagonal/LAD. 3. Patent SVG to diagonal with 90% in-stent restenosis involving the distal anastomosis. 4. Patent SVG to ramus intermedius with chronic total occlusion of jump portion to OM1.  There is 30 to 40% in-stent restenosis at the ostium of the SVG to ramus intermedius. 5. Chronic total occlusion of sequential SVG to RPDA and RPL. 6. Normal left ventricular filling pressure. 7. Successful PTCA to distal SVG to diagonal in-stent restenosis with 0% residual stenosis and TIMI-3 flow. Recommendations: 1. Dual antiplatelet therapy with aspirin and ticagrelor for at least 12 months. 2. Aggressive secondary prevention. 3. Gentle post catheterization hydration given chronic kidney disease. 4. Remove right femoral artery sheath 2 hours after discontinuation of bivalirudin. Nelva Bush, MD [facility]   DG ERCP BILIARY & PANCREATIC  DUCTS  Result Date: 06/15/2020 CLINICAL DATA:  ERCP for stones. EXAM: ERCP TECHNIQUE: Multiple spot images obtained with the fluoroscopic device and submitted for interpretation post-procedure. COMPARISON:  MRCP-06/12/2020 FLUOROSCOPY TIME:  3 minutes, 12 seconds (35.7 mGy) FINDINGS: Several spot fluoroscopic images the right upper abdominal quadrant during ERCP are provided for review Initial image demonstrates an ERCP probe overlying the right upper abdominal quadrant. Subsequent images demonstrate selective cannulation and opacification of the CBD. There are several nonocclusive filling defects seen within the mid and distal aspect of the CBD suggestive of choledocholithiasis seen on preceding MRCP. Subsequent images demonstrate insufflation of a balloon within the central aspect of the CBD with subsequent biliary sweeping and presumed sphincterotomy. There is minimal opacification of the pancreatic duct as well as the central aspect of the intrahepatic biliary tree which appears nondilated. There is no definitive opacification of the cystic duct. IMPRESSION: ERCP with biliary sweeping and presumed sphincterotomy as above. These images were submitted for radiologic interpretation only. Please see the procedural report for the amount of contrast and the fluoroscopy time utilized. Electronically Signed   By: Sandi Mariscal M.D.   On: 06/15/2020 09:02   DG C-Arm 1-60 Min  Result Date: 06/11/2020 CLINICAL DATA:  History of cholelithiasis.  Unsuccessful ERCP. EXAM: ERCP TECHNIQUE: Multiple spot images obtained with the fluoroscopic device and submitted for interpretation post-procedure. FLUOROSCOPY TIME:  23 minutes, 56 seconds COMPARISON:  CT abdomen and pelvis-06/07/2020 FINDINGS: Two spot intraoperative fluoroscopic images of the right upper abdominal quadrant during ERCP are provided for review. Initial image demonstrates an ERCP probe overlying the right upper abdominal quadrant. Subsequent image demonstrates  apparent cannulation of the pancreatic duct. There is no definitive opacification of the  common bile duct. IMPRESSION: ERCP as above. These images were submitted for radiologic interpretation only. Please see the procedural report for the amount of contrast and the fluoroscopy time utilized. Electronically Signed   By: Sandi Mariscal M.D.   On: 06/11/2020 12:09   MR ABDOMEN MRCP W WO CONTAST  Result Date: 06/13/2020 CLINICAL DATA:  Abdominal pain and elevated liver function studies. Choledocholithiasis seen on CT scan. EXAM: MRI ABDOMEN WITHOUT AND WITH CONTRAST (INCLUDING MRCP) TECHNIQUE: Multiplanar multisequence MR imaging of the abdomen was performed both before and after the administration of intravenous contrast. Heavily T2-weighted images of the biliary and pancreatic ducts were obtained, and three-dimensional MRCP images were rendered by post processing. CONTRAST:  44mL GADAVIST GADOBUTROL 1 MMOL/ML IV SOLN COMPARISON:  CT scan 06/07/2020 FINDINGS: Lower chest: Streaky bibasilar atelectasis is noted. No pleural effusions or infiltrates. No pericardial effusion. Hepatobiliary: No hepatic lesions are identified. Mild central intrahepatic biliary dilatation. The gallbladder demonstrates small layering gallstones but no wall thickening or pericholecystic fluid to suggest acute cholecystitis. Mild common bile duct dilatation is noted. The duct measures 9 mm in the porta hepatis and 9 mm in the head of the pancreas. There are several small common bile duct stones estimated at 6 or 7. These all measure less than 6 mm. Pancreas: There may be some mild residual inflammation of the pancreas on the T2 weighted images and there is a small amount of fluid in the lesser sac between the pancreas and stomach. Normal pancreatic enhancement without findings for pancreatic necrosis. No pancreatic mass. Suspect resolving pancreatitis. Spleen:  Within normal limits in size. No splenic lesions. Adrenals/Urinary Tract: The  adrenal glands and kidneys are unremarkable. No worrisome renal lesions Stomach/Bowel: The stomach, duodenum, visualized small bowel and visualized colon are grossly normal but the study is somewhat limited by motion artifact. Duodenal diverticulum noted near the pancreatic head. Vascular/Lymphatic: The aorta and branch vessels are patent. No aneurysm or dissection. No mesenteric or retroperitoneal adenopathy. Other: Small amount of fluid in the lesser sac but no other abdominal fluid collections are identified. Musculoskeletal: No significant bony findings. IMPRESSION: 1. Cholelithiasis without MR findings to suggest acute cholecystitis. 2. Choledocholithiasis with at least 6 or 7 sub 6 mm calculi in a mildly dilated common bile duct. 3. Suspect resolving pancreatitis. 4. Streaky bibasilar atelectasis. Electronically Signed   By: Marijo Sanes M.D.   On: 06/13/2020 05:47   ECHOCARDIOGRAM COMPLETE  Result Date: 05/28/2020    ECHOCARDIOGRAM REPORT   Patient Name:   [name] Zappia Date of Exam: 05/28/2020 Medical Rec #:  [id number]    Height:       63.0 in Accession #:    [id number]   Weight:       136.9 lb Date of Birth:  20-May-1946   BSA:          1.646 m Patient Age:    57 years     BP:           155/68 mmHg Patient Gender: M            HR:           57 bpm. Exam Location:  Inpatient Procedure: 2D Echo, Cardiac Doppler, Color Doppler and Intracardiac            Opacification Agent Indications:    NSTEMI  History:        Patient has prior history of Echocardiogram examinations, most  recent 01/20/2020. CHF, CAD, Prior CABG, COPD, Aortic Valve                 Disease and mild AS, Signs/Symptoms:Chest Pain; Risk                 Factors:Diabetes, Hypertension and Dyslipidemia.  Sonographer:    Dustin Flock Referring Phys: [id number] [place]  1. Left ventricular ejection fraction, by estimation, is 40 to 45%. The left ventricle has mildly decreased function. The left ventricle  demonstrates regional wall motion abnormalities (see scoring diagram/findings for description). There is mild left ventricular hypertrophy. Left ventricular diastolic parameters are consistent with Grade I diastolic dysfunction (impaired relaxation).  2. Right ventricular systolic function is normal. The right ventricular size is mildly enlarged. There is mildly elevated pulmonary artery systolic pressure.  3. Left atrial size was mildly dilated.  4. The mitral valve is grossly normal. No evidence of mitral valve regurgitation. Moderate mitral annular calcification.  5. The aortic valve is tricuspid. There is mild calcification of the aortic valve. Aortic valve regurgitation is mild.  6. The inferior vena cava is dilated in size with <50% respiratory variability, suggesting right atrial pressure of 15 mmHg. Comparison(s): A prior study was performed on 01/20/20. New decrease in LVEF and new WMA's. Primary cardiology team aware. FINDINGS  Left Ventricle: Left ventricular ejection fraction, by estimation, is 40 to 45%. The left ventricle has mildly decreased function. The left ventricle demonstrates regional wall motion abnormalities. Definity contrast agent was given IV to delineate the left ventricular endocardial borders. The left ventricular internal cavity size was normal in size. There is mild left ventricular hypertrophy. Left ventricular diastolic parameters are consistent with Grade I diastolic dysfunction (impaired relaxation).  LV Wall Scoring: The inferior wall, posterior wall, and mid anterolateral segment are hypokinetic. Infero and inferolateral hypokinesis. Right Ventricle: The right ventricular size is mildly enlarged. No increase in right ventricular wall thickness. Right ventricular systolic function is normal. There is mildly elevated pulmonary artery systolic pressure. The tricuspid regurgitant velocity is 2.51 m/s, and with an assumed right atrial pressure of 15 mmHg, the estimated right  ventricular systolic pressure is 78.9 mmHg. Left Atrium: Left atrial size was mildly dilated. Right Atrium: Right atrial size was normal in size. Pericardium: There is no evidence of pericardial effusion. Mitral Valve: The mitral valve is grossly normal. There is mild thickening of the mitral valve leaflet(s). Moderate mitral annular calcification. No evidence of mitral valve regurgitation. Tricuspid Valve: The tricuspid valve is normal in structure. Tricuspid valve regurgitation is not demonstrated. Aortic Valve: The aortic valve is tricuspid. There is mild calcification of the aortic valve. Aortic valve regurgitation is mild. Aortic regurgitation PHT measures 710 msec. Aortic valve mean gradient measures 10.0 mmHg. Aortic valve peak gradient measures 17.5 mmHg. Aortic valve area, by VTI measures 1.05 cm. Pulmonic Valve: The pulmonic valve was grossly normal. Pulmonic valve regurgitation is not visualized. Aorta: The aortic root and ascending aorta are structurally normal, with no evidence of dilitation. Venous: The inferior vena cava is dilated in size with less than 50% respiratory variability, suggesting right atrial pressure of 15 mmHg. IAS/Shunts: The atrial septum is grossly normal.  LEFT VENTRICLE PLAX 2D LVIDd:         4.10 cm  Diastology LVIDs:         3.10 cm  LV e' medial:    3.48 cm/s LV PW:         1.20 cm  LV E/e'  medial:  28.4 LV IVS:        1.30 cm  LV e' lateral:   9.79 cm/s LVOT diam:     1.80 cm  LV E/e' lateral: 10.1 LV SV:         52 LV SV Index:   31 LVOT Area:     2.54 cm  RIGHT VENTRICLE            IVC RV S prime:     5.44 cm/s  IVC diam: 2.30 cm TAPSE (M-mode): 0.9 cm LEFT ATRIUM             Index       RIGHT ATRIUM           Index LA diam:        3.20 cm 1.94 cm/m  RA Area:     18.00 cm LA Vol (A2C):   46.9 ml 28.49 ml/m RA Volume:   41.00 ml  24.91 ml/m LA Vol (A4C):   41.3 ml 25.09 ml/m LA Biplane Vol: 46.8 ml 28.43 ml/m  AORTIC VALVE AV Area (Vmax):    1.03 cm AV Area (Vmean):    1.00 cm AV Area (VTI):     1.05 cm AV Vmax:           209.00 cm/s AV Vmean:          145.000 cm/s AV VTI:            0.493 m AV Peak Grad:      17.5 mmHg AV Mean Grad:      10.0 mmHg LVOT Vmax:         84.20 cm/s LVOT Vmean:        57.100 cm/s LVOT VTI:          0.203 m LVOT/AV VTI ratio: 0.41 AI PHT:            710 msec  AORTA Ao Root diam: 3.00 cm Ao Asc diam:  3.40 cm MITRAL VALVE                TRICUSPID VALVE MV Area (PHT): 3.85 cm     TR Peak grad:   25.2 mmHg MV Decel Time: 197 msec     TR Vmax:        251.00 cm/s MV E velocity: 99.00 cm/s MV A velocity: 107.00 cm/s  SHUNTS MV E/A ratio:  0.93         Systemic VTI:  0.20 m                             Systemic Diam: 1.80 cm Rudean Haskell MD Electronically signed by Rudean Haskell MD Signature Date/Time: 05/28/2020/2:19:54 PM    Final    DG HIP UNILAT WITH PELVIS 2-3 VIEWS LEFT  Result Date: 05/23/2020 CLINICAL DATA:  Left hip pain. EXAM: DG HIP (WITH OR WITHOUT PELVIS) 2-3V LEFT COMPARISON:  Pelvis 05/22/2020. FINDINGS: Degenerative changes left hip. No acute bony or joint abnormality. Peripheral vascular calcification. Pelvic calcifications consistent phleboliths. IMPRESSION: Degenerative changes left hip. No acute abnormality. Electronically Signed   By: Marcello Moores  Register   On: 05/23/2020 05:17   ECHOCARDIOGRAM LIMITED  Result Date: 06/08/2020    ECHOCARDIOGRAM LIMITED REPORT   Patient Name:   ADORIAN GWYNNE Miramontes Date of Exam: 06/08/2020 Medical Rec #:  [id number]    Height:       63.0 in Accession #:    [id number]  Weight:       134.9 lb Date of Birth:  January 22, 1946   BSA:          1.636 m Patient Age:    89 years     BP:           153/62 mmHg Patient Gender: M            HR:           69 bpm. Exam Location:  Inpatient Procedure: Limited Echo, Limited Color Doppler and Cardiac Doppler Indications:    cad of native vessel 414.01  History:        Patient has prior history of Echocardiogram examinations, most                 recent 05/28/2020.  Prior CABG, chronic kidney disease. sepsis.                 HIV; Risk Factors:Hypertension, Dyslipidemia and Diabetes.  Sonographer:    Johny Chess Referring Phys: [id number] [place]  1. Compared to echo report from 05/28/20, LVEF is improved. . Left ventricular ejection fraction, by estimation, is 60 to 65%. The left ventricle has normal function. The left ventricle has no regional wall motion abnormalities.  2. Right ventricular systolic function is normal. The right ventricular size is normal. There is mildly elevated pulmonary artery systolic pressure.  3. Mild mitral valve regurgitation.  4. TR jet is eccentric, directed anterior in RA.. Tricuspid valve regurgitation is mild to moderate.  5. AV is thickened, calcified with restricted motion . Peak and mean gradients through the valve are 25 and 15 mm Hg respectively This is increasred from echo report of 10/25.. The aortic valve is abnormal. Aortic valve regurgitation is mild.  6. The inferior vena cava is dilated in size with <50% respiratory variability, suggesting right atrial pressure of 15 mmHg. FINDINGS  Left Ventricle: Compared to echo report from 05/28/20, LVEF is improved. Left ventricular ejection fraction, by estimation, is 60 to 65%. The left ventricle has normal function. The left ventricle has no regional wall motion abnormalities. The left ventricular internal cavity size was normal in size. There is no left ventricular hypertrophy. Right Ventricle: The right ventricular size is normal. Right ventricular systolic function is normal. There is mildly elevated pulmonary artery systolic pressure. The tricuspid regurgitant velocity is 2.95 m/s, and with an assumed right atrial pressure of 5 mmHg, the estimated right ventricular systolic pressure is 45.4 mmHg. Left Atrium: Left atrial size was normal in size. Right Atrium: Right atrial size was normal in size. Pericardium: There is no evidence of pericardial effusion.  Mitral Valve: The mitral valve is abnormal. There is mild thickening of the mitral valve leaflet(s). Mild to moderate mitral annular calcification. Mild mitral valve regurgitation. Tricuspid Valve: TR jet is eccentric, directed anterior in RA. The tricuspid valve is normal in structure. Tricuspid valve regurgitation is mild to moderate. Aortic Valve: AV is thickened, calcified with restricted motion . Peak and mean gradients through the valve are 25 and 15 mm Hg respectively This is increasred from echo report of 10/25. The aortic valve is abnormal. Aortic valve regurgitation is mild. Aortic regurgitation PHT measures 327 msec. Aortic valve mean gradient measures 15.0 mmHg. Aortic valve peak gradient measures 25.4 mmHg. Pulmonic Valve: The pulmonic valve was normal in structure. Pulmonic valve regurgitation is not visualized. Aorta: The aortic root and ascending aorta are structurally normal, with no evidence of dilitation. Venous: The inferior vena cava  is dilated in size with less than 50% respiratory variability, suggesting right atrial pressure of 15 mmHg. LEFT VENTRICLE PLAX 2D LVIDd:         5.00 cm Diastology LVIDs:         3.60 cm LV e' medial:    5.22 cm/s LV PW:         0.70 cm LV E/e' medial:  22.2 LV IVS:        0.90 cm LV e' lateral:   10.70 cm/s                        LV E/e' lateral: 10.8  IVC IVC diam: 2.20 cm LEFT ATRIUM         Index LA diam:    4.20 cm 2.57 cm/m  AORTIC VALVE AV Vmax:           252.00 cm/s AV Vmean:          185.000 cm/s AV VTI:            0.571 m AV Peak Grad:      25.4 mmHg AV Mean Grad:      15.0 mmHg LVOT Vmax:         93.30 cm/s LVOT Vmean:        65.800 cm/s LVOT VTI:          0.210 m LVOT/AV VTI ratio: 0.37 AI PHT:            327 msec  AORTA Ao Asc diam: 3.50 cm MITRAL VALVE                TRICUSPID VALVE MV Area (PHT): 3.72 cm     TR Peak grad:   34.8 mmHg MV Decel Time: 204 msec     TR Vmax:        295.00 cm/s MV E velocity: 116.00 cm/s MV A velocity: 96.40 cm/s    SHUNTS MV E/A ratio:  1.20         Systemic VTI: 0.21 m Dorris Carnes MD Electronically signed by Dorris Carnes MD Signature Date/Time: 06/08/2020/6:54:02 PM    Final    US Abdomen Limited RUQ (LIVER/GB)  Result Date: 06/07/2020 CLINICAL DATA:  Right upper quadrant pain EXAM: ULTRASOUND ABDOMEN LIMITED RIGHT UPPER QUADRANT COMPARISON:  MRI/MRCP 03/29/2014 FINDINGS: Gallbladder: Tiny stones and sludge noted layering within the gallbladder. No gallbladder wall thickening, inflammation, or distension. Negative sonographic Murphy's sign. Common bile duct: Diameter: 8.9 mm.  No signs of choledocholithiasis. Liver: No focal lesion identified. Within normal limits in parenchymal echogenicity. Portal vein is patent on color Doppler imaging with normal direction of blood flow towards the liver. Other: None. IMPRESSION: 1. Tiny stones and sludge noted layering within the gallbladder. No secondary signs of acute cholecystitis. 2. Mild increase caliber of the common bile duct which measures up to 8.9 mm. No signs of choledocholithiasis. If there is a clinical concern for biliary obstruction and or choledocholithiasis consider further investigation with MRI/MRCP. Right upper quadrant pain please pick the correct US ABDOMEN LIMITED template. Electronically Signed   By: Kerby Moors M.D.   On: 06/07/2020 07:39     Time coordinating discharge: Over 30 minutes    Dwyane Dee, MD  [facility] 06/18/2020, 12:47 PM

## 2020-06-18 NOTE — Evaluation (Signed)
Physical Therapy Evaluation Patient Details Name: [name] MRN: [id number] DOB: 1946-01-19 Today's Date: 06/18/2020   History of Present Illness  Pt is a 74 y/o M presenting to the ED on 11/4 for R upper quadrant pain and found to have choledocholithiasis. Pt had a unsuccessful ERCP on 11/8 but stent was placed. 11/12 successful ERCP was done and stones removed. 11/13 had a cholecystecomy. PMH includes CAD, CHF, DM II< stage 3 acute renal failure, and HIV.  Clinical Impression  Pt presents to PT with decreased endurance/tolerance to gait, since pt states that he normally walks more at home. Pt would benefit from acute PT while he is in the hospital to increase activity tolerance in order to return to PLOF. Pt does not need PT after hospital stay, since he has no other functional limitations and is able to ambulate household distances.     Follow Up Recommendations No PT follow up    Equipment Recommendations  None recommended by PT    Recommendations for Other Services       Precautions / Restrictions        Mobility  Bed Mobility Overal bed mobility: Modified Independent             General bed mobility comments: with use of rail and increased time    Transfers Overall transfer level: Modified independent                  Ambulation/Gait Ambulation/Gait assistance: Supervision Gait Distance (Feet): 150 Feet Assistive device: None Gait Pattern/deviations: Step-through pattern;Decreased stride length   Gait velocity interpretation: >2.62 ft/sec, indicative of community ambulatory General Gait Details: rounded shoulders, supervision for IV only, toe out gait  Stairs            Wheelchair Mobility    Modified Rankin (Stroke Patients Only)       Balance Overall balance assessment: Mild deficits observed, not formally tested                                           Pertinent Vitals/Pain Pain Assessment: 0-10 Pain Score: 2   Pain Location: abdomen Pain Descriptors / Indicators: Sore Pain Intervention(s): Limited activity within patient's tolerance;Monitored during session    Home Living Family/patient expects to be discharged to:: Private residence Living Arrangements: Alone Available Help at Discharge: Friend(s);Family;Available PRN/intermittently Type of Home: Apartment Home Access: Stairs to enter   Entrance Stairs-Number of Steps: 1 Home Layout: One level Home Equipment: Grab bars - toilet;Cane - single point      Prior Function Level of Independence: Independent               Hand Dominance        Extremity/Trunk Assessment   Upper Extremity Assessment Upper Extremity Assessment: Overall WFL for tasks assessed    Lower Extremity Assessment Lower Extremity Assessment: Overall WFL for tasks assessed    Cervical / Trunk Assessment Cervical / Trunk Assessment: Kyphotic (forward head)  Communication   Communication: No difficulties  Cognition Arousal/Alertness: Awake/alert Behavior During Therapy: WFL for tasks assessed/performed Overall Cognitive Status: Within Functional Limits for tasks assessed                                        General Comments  Exercises     Assessment/Plan    PT Assessment    PT Problem List         PT Treatment Interventions      PT Goals (Current goals can be found in the Care Plan section)  Acute Rehab PT Goals Patient Stated Goal: return home and take care of my plants PT Goal Formulation: With patient Time For Goal Achievement: 07/02/20 Potential to Achieve Goals: Good    Frequency     Barriers to discharge        Co-evaluation               AM-PAC PT "6 Clicks" Mobility  Outcome Measure Help needed turning from your back to your side while in a flat bed without using bedrails?: None Help needed moving from lying on your back to sitting on the side of a flat bed without using bedrails?: A  Little Help needed moving to and from a bed to a chair (including a wheelchair)?: A Little Help needed standing up from a chair using your arms (e.g., wheelchair or bedside chair)?: None Help needed to walk in hospital room?: A Little Help needed climbing 3-5 steps with a railing? : A Little 6 Click Score: 20    End of Session Equipment Utilized During Treatment: Gait belt Activity Tolerance: Patient tolerated treatment well Patient left: in chair;with call bell/phone within reach Nurse Communication: Mobility status      Time: 8416-6063 PT Time Calculation (min) (ACUTE ONLY): 22 min   Charges:   PT Evaluation $PT Eval Moderate Complexity: 1 Mod          [name], SPT [id number]  [name] 06/18/2020, 8:18 AM

## 2020-06-18 NOTE — Progress Notes (Signed)
Progress Note  2 Days Post-Op  Subjective: Patient is tolerating diet and having bowel function. Nausea is resolved. Reviewed post-op care again.   Objective: Vital signs in last 24 hours: Temp:  [98.6 F (37 C)-99 F (37.2 C)] 98.9 F (37.2 C) (11/15 0439) Pulse Rate:  [66-80] 79 (11/15 0439) Resp:  [16-25] 16 (11/15 0439) BP: (136-161)/(64-76) 161/76 (11/15 0439) SpO2:  [95 %-98 %] 98 % (11/15 0439) Last BM Date: 06/17/20  Intake/Output from previous day: 11/14 0701 - 11/15 0700 In: -  Out: 400 [Urine:400] Intake/Output this shift: Total I/O In: 100 [IV Piggyback:100] Out: 200 [Urine:200]  PE: General: pleasant, WD, WN male who is laying in bed in NAD HEENT: sclera anicteric Heart: regular, rate, and rhythm.  Palpable radial and pedal pulses bilaterally Lungs: CTAB, no wheezes, rhonchi, or rales noted.  Respiratory effort nonlabored Abd: soft, mildly ttp in RUQ, ND, +BS, incisions c/d/i   Lab Results:  Recent Labs    06/17/20 0049 06/18/20 0344  WBC 14.1* 10.9*  HGB 11.4* 10.9*  HCT 33.9* 33.1*  PLT 252 184   BMET Recent Labs    06/17/20 0049 06/18/20 0344  NA 136 138  K 4.9 4.1  CL 106 107  CO2 22 23  GLUCOSE 221* 117*  BUN 22 14  CREATININE 1.40* 1.39*  CALCIUM 8.3* 8.4*   PT/INR No results for input(s): LABPROT, INR in the last 72 hours. CMP     Component Value Date/Time   NA 138 06/18/2020 0344   NA 142 09/30/2018 0000   K 4.1 06/18/2020 0344   CL 107 06/18/2020 0344   CO2 23 06/18/2020 0344   GLUCOSE 117 (H) 06/18/2020 0344   BUN 14 06/18/2020 0344   BUN 22 (A) 09/30/2018 0000   CREATININE 1.39 (H) 06/18/2020 0344   CREATININE 2.10 (H) 06/05/2020 1101   CALCIUM 8.4 (L) 06/18/2020 0344   PROT 5.8 (L) 06/18/2020 0344   PROT 6.7 01/28/2018 0957   ALBUMIN 2.4 (L) 06/18/2020 0344   ALBUMIN 4.3 01/28/2018 0957   AST 81 (H) 06/18/2020 0344   ALT 50 (H) 06/18/2020 0344   ALKPHOS 75 06/18/2020 0344   BILITOT 1.9 (H) 06/18/2020 0344     BILITOT 0.4 01/28/2018 0957   GFRNONAA 54 (L) 06/18/2020 0344   GFRNONAA 31 (L) 06/05/2020 1101   GFRAA 35 (L) 06/05/2020 1101   Lipase     Component Value Date/Time   LIPASE 29 06/17/2020 0049       Studies/Results: No results found.  Anti-infectives: Anti-infectives (From admission, onward)   Start     Dose/Rate Route Frequency Ordered Stop   06/18/20 0000  cephALEXin (KEFLEX) 500 MG capsule        500 mg Oral 2 times daily 06/18/20 1005 06/28/20 2359   06/15/20 0800  ceFAZolin (ANCEF) IVPB 2g/100 mL premix        2 g 200 mL/hr over 30 Minutes Intravenous Every 12 hours 06/14/20 1314     06/08/20 0800  cefTRIAXone (ROCEPHIN) 2 g in sodium chloride 0.9 % 100 mL IVPB  Status:  Discontinued        2 g 200 mL/hr over 30 Minutes Intravenous Every 24 hours 06/07/20 1102 06/14/20 1314   06/07/20 1430  doravirine (PIFELTRO) tablet 100 mg       Note to Pharmacy: OP OEV:OJJK 1 TABLET(100 MG) BY MOUTH DAILY with Tivicay Patient taking differently: Take 100 mg by mouth in the morning (with Tivicay)  100 mg Oral Every morning 06/07/20 1102     06/07/20 1345  dolutegravir (TIVICAY) tablet 50 mg        50 mg Oral Every morning 06/07/20 1102     06/07/20 1200  metroNIDAZOLE (FLAGYL) IVPB 500 mg  Status:  Discontinued        500 mg 100 mL/hr over 60 Minutes Intravenous Every 8 hours 06/07/20 1102 06/07/20 2318   06/07/20 1115  valACYclovir (VALTREX) tablet 500 mg        500 mg Oral Every morning 06/07/20 1102     06/07/20 0715  piperacillin-tazobactam (ZOSYN) IVPB 3.375 g        3.375 g 100 mL/hr over 30 Minutes Intravenous  Once 06/07/20 0703 06/07/20 0831   06/07/20 0630  cefTRIAXone (ROCEPHIN) 1 g in sodium chloride 0.9 % 100 mL IVPB        1 g 200 mL/hr over 30 Minutes Intravenous  Once 06/07/20 0618 06/07/20 0705       Assessment/Plan HTN HLD HIV DM2 CHF (EF 40-45% on 05/28/20) CAD (hx of CABG, mult PCI with most recent angioplasty on 05/28/20 for in-stent  restenosis) -Brilintaon hold.Cleared by cardiology  E. Coli Bacteremia  Choledocholithiasis S/p lap chole 11/13 Dr. Barry Dienes  - s/p ERCP - POD#2 - LFTs mildly up, likely from cautery and not unexpected - nausea resolved - stable for discharge  from a surgical perspective. Ok to resume Brilinta today - abx for bacteremia per primary service - follow up in chart, we will sign off at this time. Please call if we can be of assistance  FEN -CM diet  VTE -SCDs ID -Anceffor E. Coli Bacteremia  LOS: 11 days    Norm Parcel , [facility] 06/18/2020, 10:20 AM Please see Amion for pager number during day hours 7:00am-4:30pm

## 2020-06-19 ENCOUNTER — Other Ambulatory Visit: Payer: Self-pay

## 2020-06-19 LAB — SURGICAL PATHOLOGY

## 2020-06-19 NOTE — Anesthesia Postprocedure Evaluation (Signed)
Anesthesia Post Note  Patient: [name]  Procedure(s) Performed: ENDOSCOPIC RETROGRADE CHOLANGIOPANCREATOGRAPHY (ERCP) (N/A ) BIOPSY SPHINCTEROTOMY REMOVAL OF STONES     Patient location during evaluation: Endoscopy Anesthesia Type: General Level of consciousness: awake and alert Pain management: pain level controlled Vital Signs Assessment: post-procedure vital signs reviewed and stable Respiratory status: spontaneous breathing, nonlabored ventilation, respiratory function stable and patient connected to nasal cannula oxygen Cardiovascular status: blood pressure returned to baseline and stable Postop Assessment: no apparent nausea or vomiting Anesthetic complications: no   No complications documented.  Last Vitals:  Vitals:   06/17/20 2309 06/18/20 0439  BP: (!) 158/73 (!) 161/76  Pulse: 72 79  Resp: 16 16  Temp: 37.1 C 37.2 C  SpO2: 98% 98%    Last Pain:  Vitals:   06/18/20 0915  TempSrc:   PainSc: 0-No pain   Pain Goal: Patients Stated Pain Goal: 0 (06/13/20 2121)                 [name]

## 2020-06-26 DIAGNOSIS — R404 Transient alteration of awareness: Secondary | ICD-10-CM | POA: Diagnosis not present

## 2020-06-27 ENCOUNTER — Other Ambulatory Visit: Payer: Self-pay | Admitting: Physician Assistant

## 2020-07-02 ENCOUNTER — Telehealth: Payer: Self-pay | Admitting: Endocrinology

## 2020-07-02 NOTE — Telephone Encounter (Signed)
Pt daughter Brooks Kinnan called to inform Dr. Dwyane Dee pt deceased on July 07, 2020.

## 2020-07-02 NOTE — Telephone Encounter (Signed)
FYI: so sad

## 2020-07-04 DIAGNOSIS — 419620001 Death: Secondary | SNOMED CT | POA: Diagnosis not present

## 2020-07-04 DEATH — deceased

## 2020-07-05 ENCOUNTER — Ambulatory Visit: Payer: Medicare Other | Admitting: Cardiology

## 2020-07-12 ENCOUNTER — Ambulatory Visit: Payer: Medicare Other | Admitting: Allergy & Immunology

## 2020-07-13 ENCOUNTER — Other Ambulatory Visit: Payer: Self-pay | Admitting: Internal Medicine

## 2020-07-13 DIAGNOSIS — B20 Human immunodeficiency virus [HIV] disease: Secondary | ICD-10-CM

## 2020-08-08 ENCOUNTER — Other Ambulatory Visit: Payer: Self-pay | Admitting: Internal Medicine

## 2020-08-08 ENCOUNTER — Other Ambulatory Visit: Payer: Self-pay | Admitting: Physician Assistant

## 2020-09-06 ENCOUNTER — Other Ambulatory Visit: Payer: Medicare Other

## 2020-09-10 ENCOUNTER — Ambulatory Visit: Payer: Medicare Other | Admitting: Endocrinology

## 2021-01-14 ENCOUNTER — Other Ambulatory Visit: Payer: Self-pay | Admitting: Infectious Diseases

## 2021-03-06 ENCOUNTER — Other Ambulatory Visit: Payer: Medicare Other

## 2021-03-20 ENCOUNTER — Encounter: Payer: Medicare Other | Admitting: Internal Medicine

## 2021-04-02 ENCOUNTER — Ambulatory Visit: Payer: Medicare Other

## 2022-04-03 IMAGING — CR DG LUMBAR SPINE 2-3V
3 series · 3 of 3 positions shown · non-contrast
Comparison: Lumbar spine radiograph dated 02/15/2018.

CLINICAL DATA: 75-year-old male with chronic low back pain.

EXAM:
LUMBAR SPINE - 2-3 VIEW

[t l-spine a.p.]
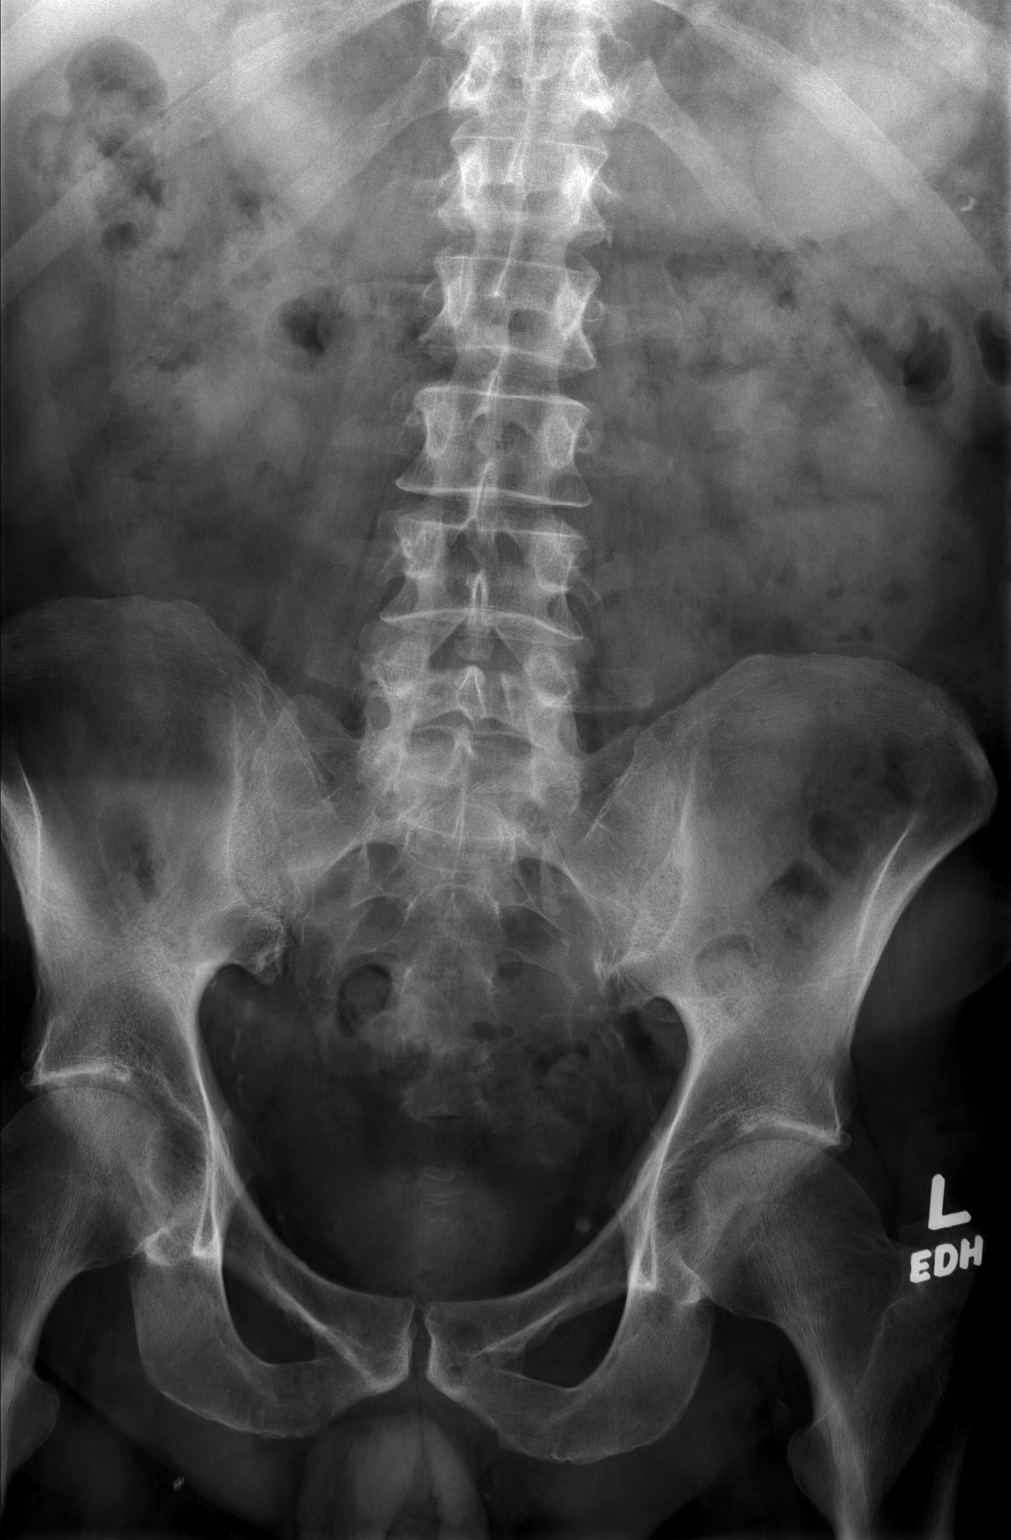

[t l-spine lat]
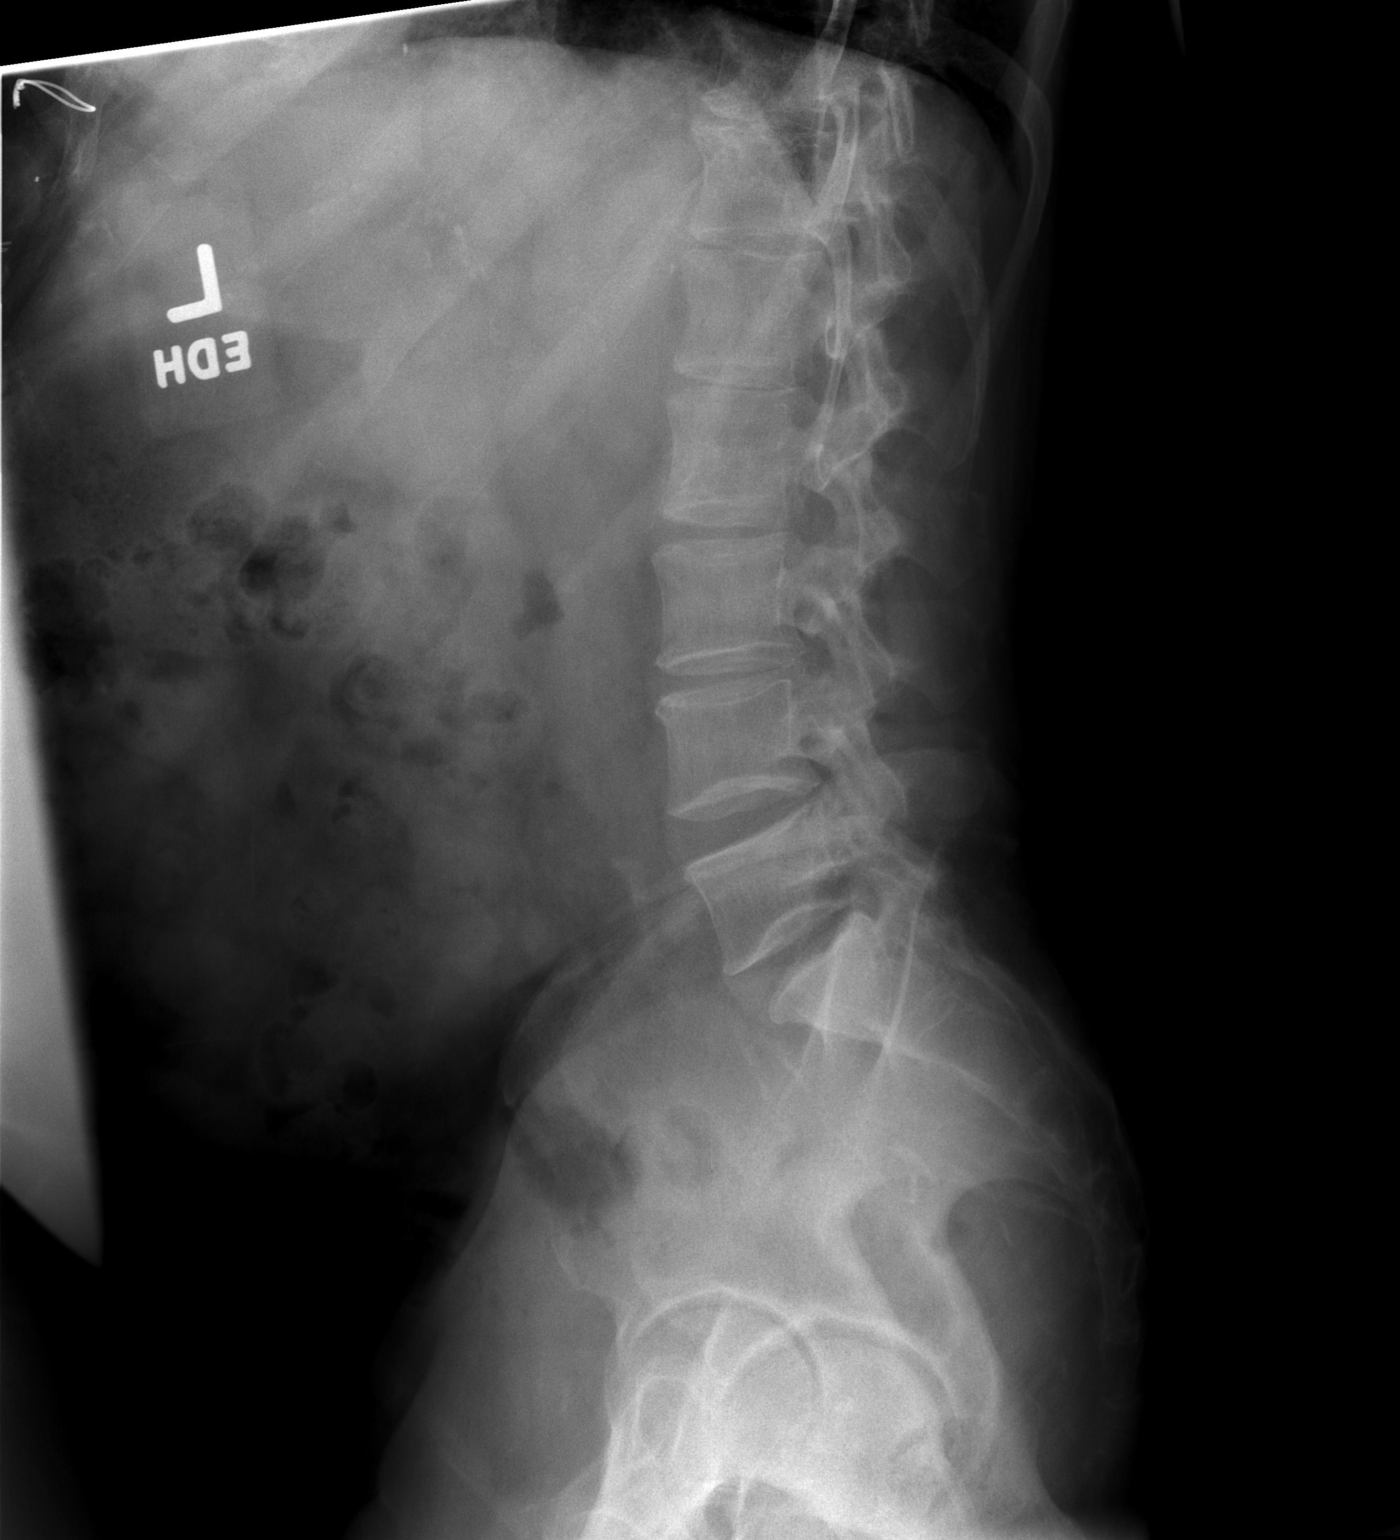

[t l-spine l5-s1 spot]
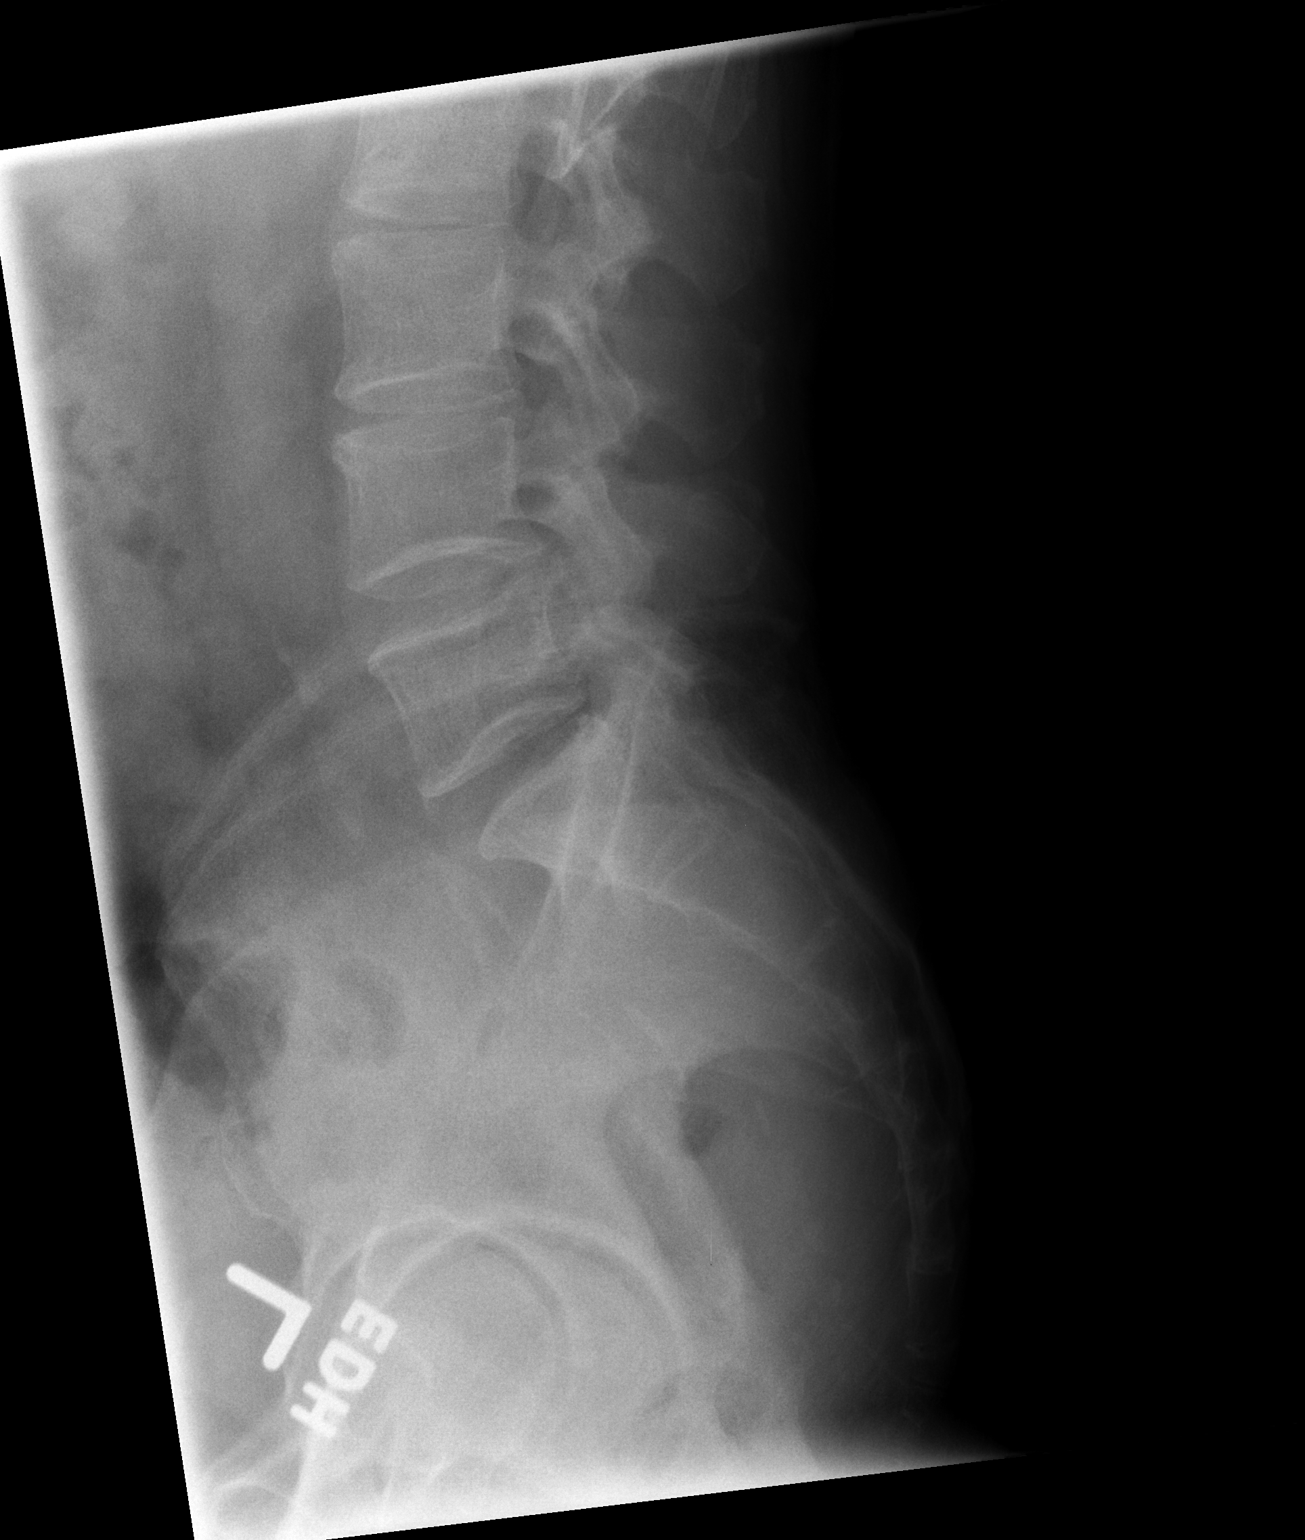

[3 of 3 positions shown; findings below may reference images not displayed]

FINDINGS: Five lumbar type vertebra. There is no acute fracture or subluxation
of the lumbar spine. The vertebral body heights and disc spaces are
maintained. The visualized posterior elements are intact. The soft
tissues are unremarkable.
IMPRESSION: No acute/traumatic lumbar spine pathology.

## 2022-04-08 IMAGING — CR DG CHEST 2V
2 series · 2 of 2 positions shown · non-contrast
Comparison: Multiple priors, most recent October 20, 2017

CLINICAL DATA: Chest pain.

EXAM:
CHEST - 2 VIEW

[chest pa]
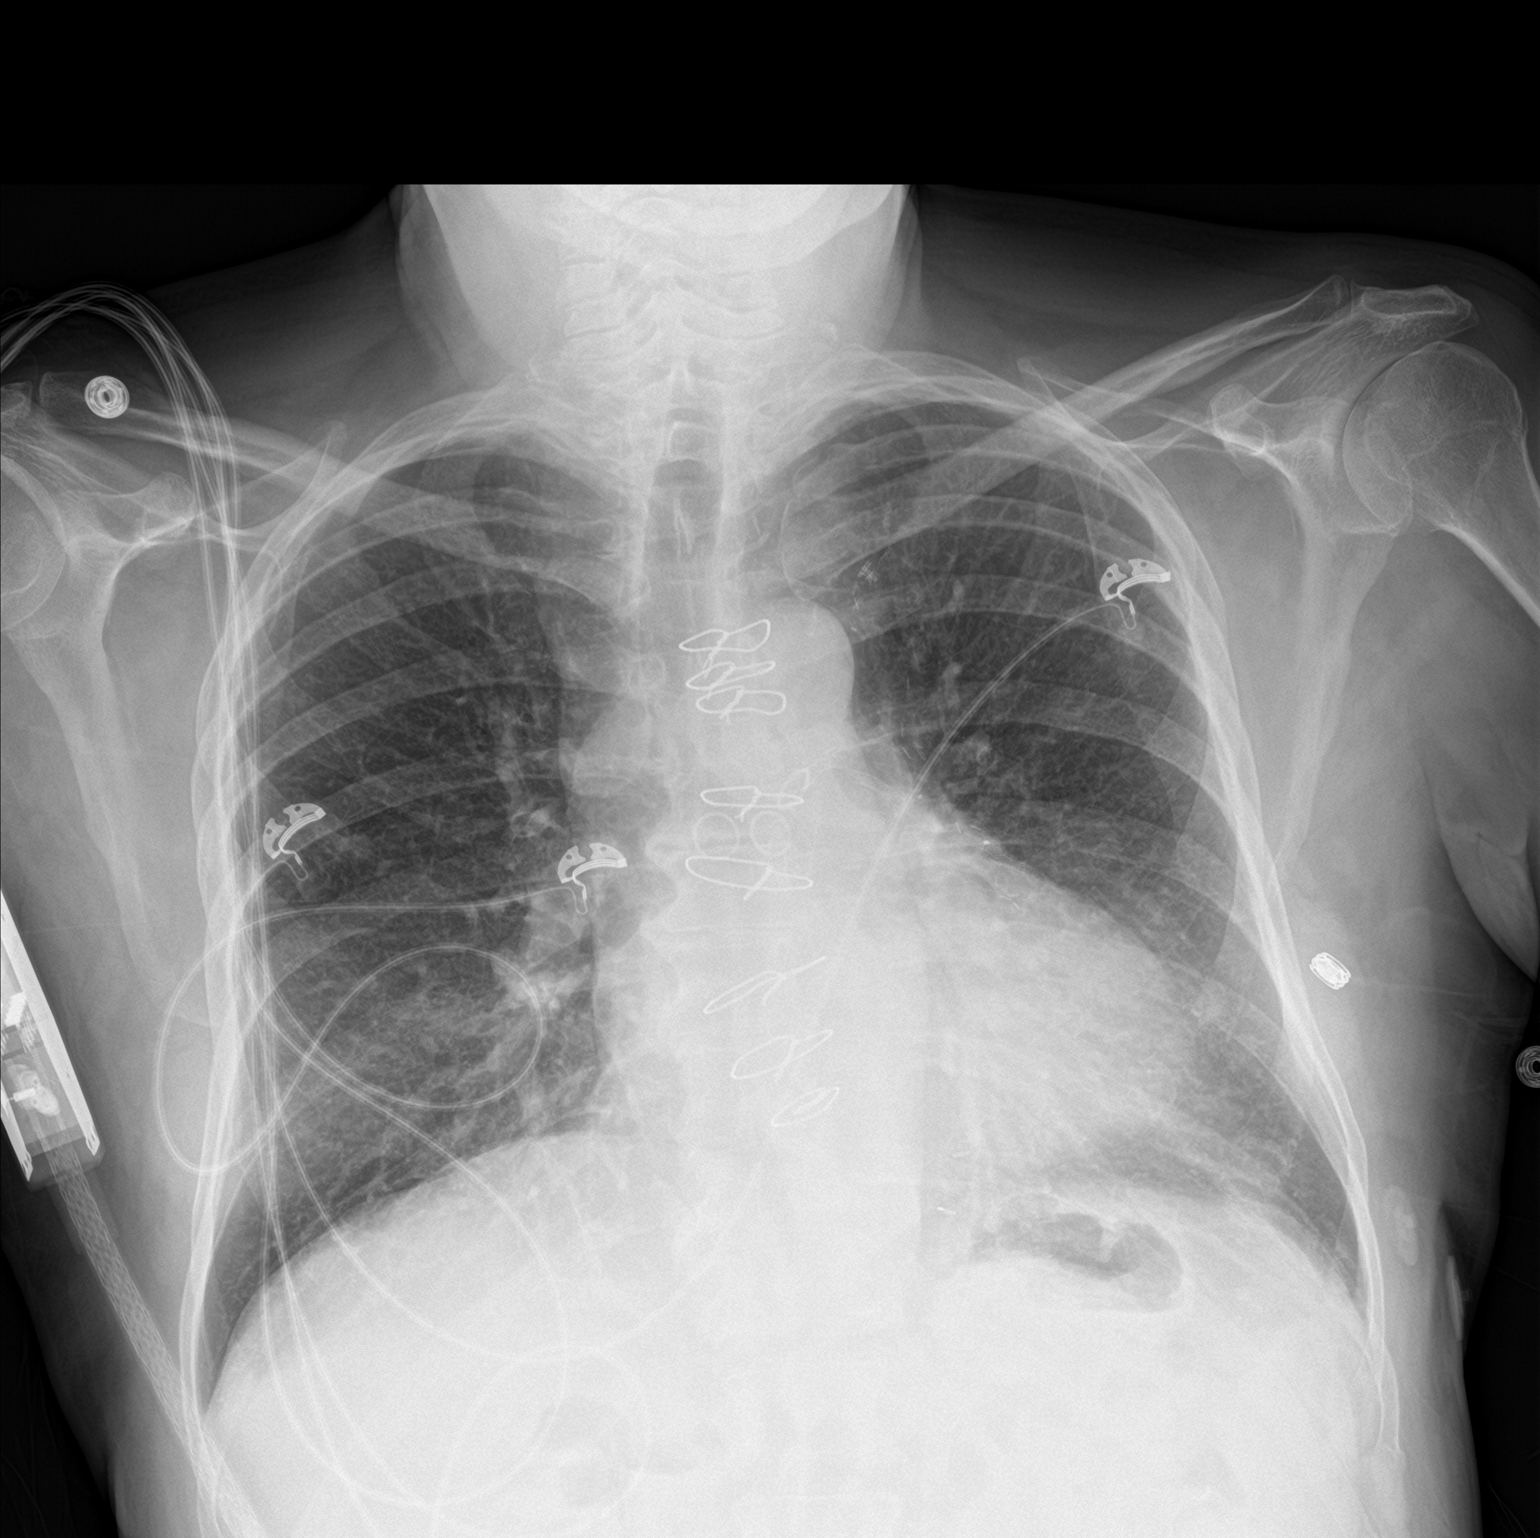

[chest lat]
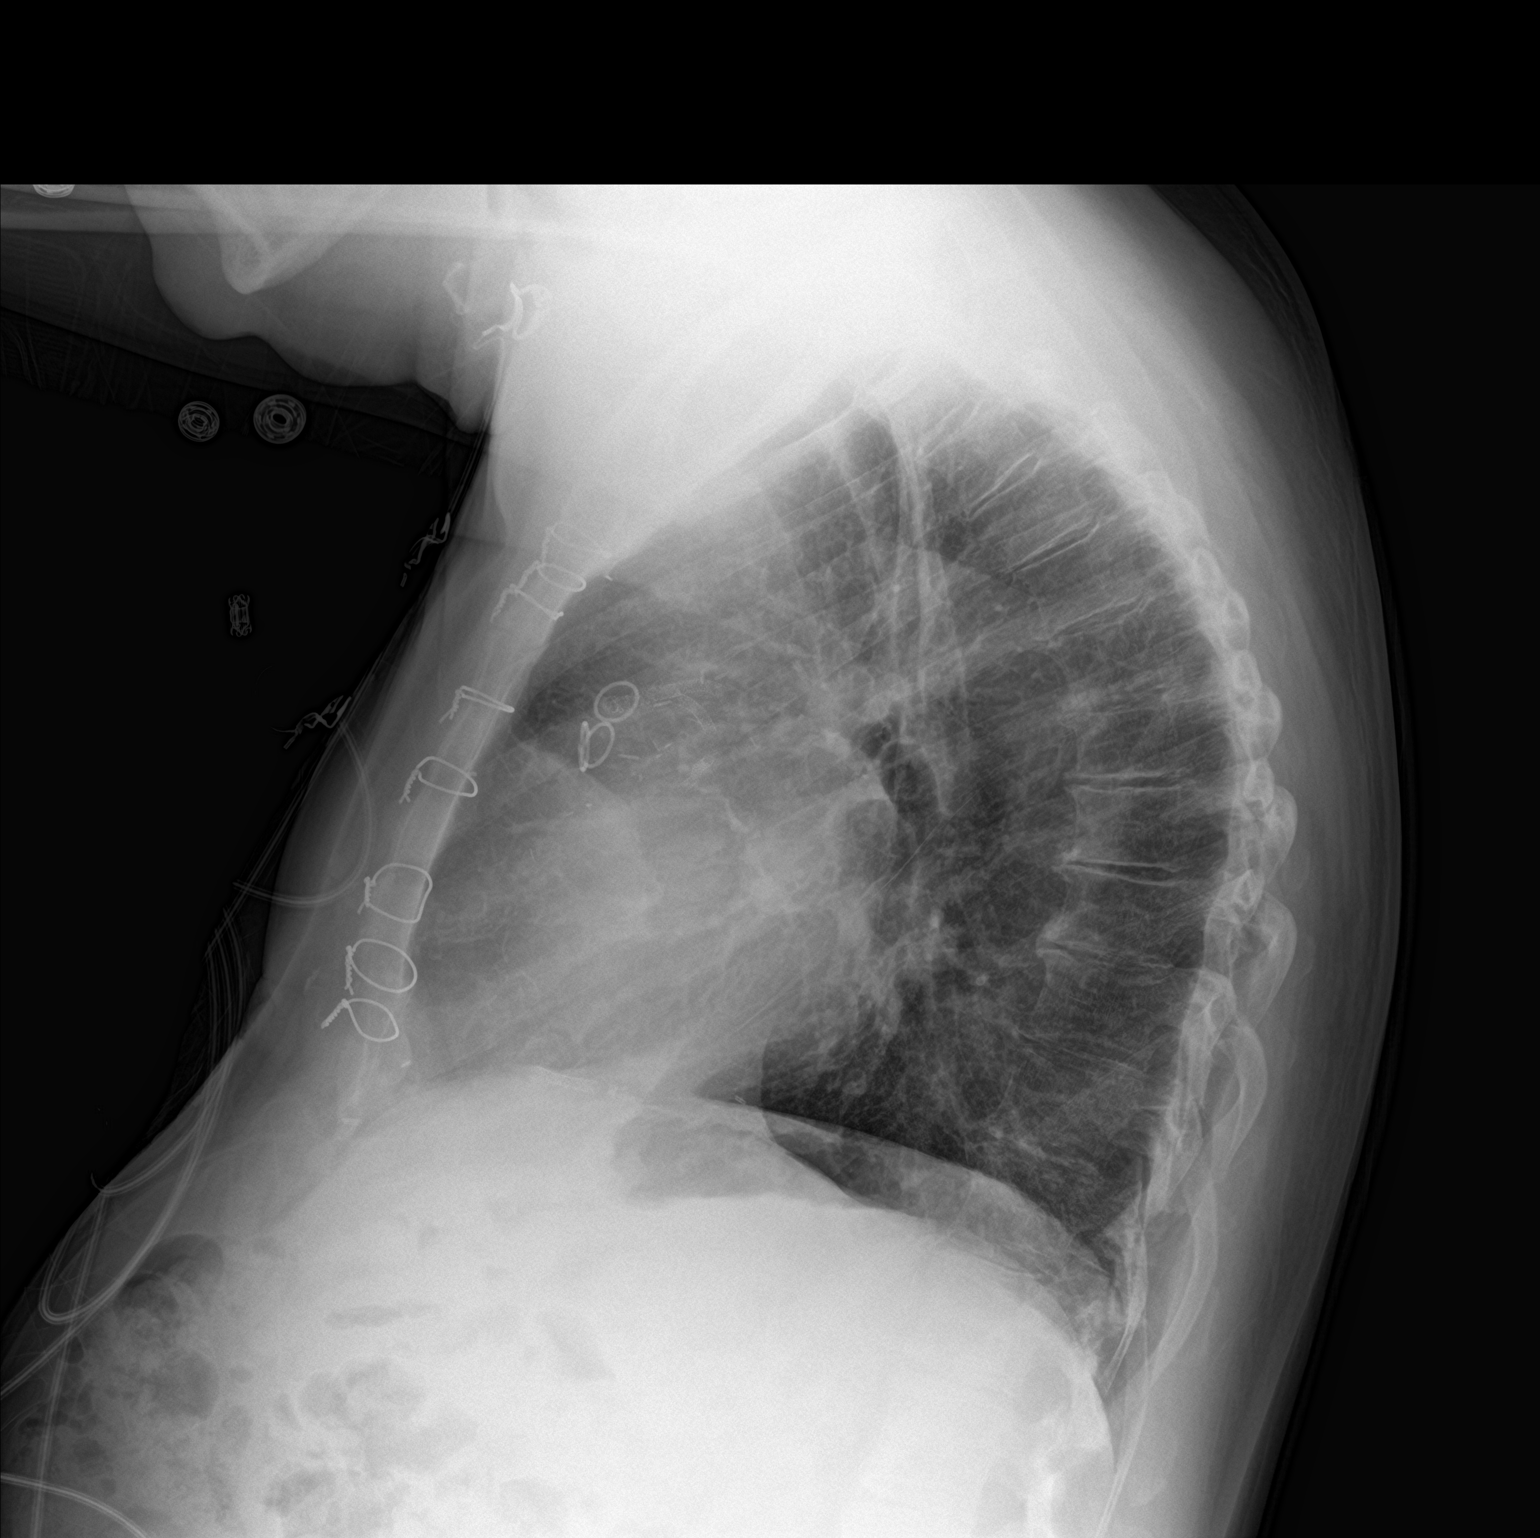

[2 of 2 positions shown; findings below may reference images not displayed]

FINDINGS: Similar cardiomediastinal silhouette. Postsurgical changes of CABG
and median sternotomy. Linear and hazy left basilar opacities,
similar over multiple priors and likely related to atelectasis or
scar. No new focal consolidation. No pleural effusions or
pneumothorax. No acute osseous abnormality.
IMPRESSION: Left basilar atelectasis/scar without superimposed acute
cardiopulmonary disease.
